# Patient Record
Sex: Male | Born: 1955 | Race: White | Hispanic: No | Marital: Single | Smoking: Never smoker
Health system: Southern US, Community
[De-identification: ages and names within clinical notes are randomized; demographics above are authoritative.]

## PROBLEM LIST (undated history)

## (undated) ENCOUNTER — Emergency Department (HOSPITAL_COMMUNITY): Admission: EM | Payer: Medicaid Other

## (undated) ENCOUNTER — Emergency Department (HOSPITAL_COMMUNITY): Payer: Medicare HMO

## (undated) DIAGNOSIS — F1010 Alcohol abuse, uncomplicated: Secondary | ICD-10-CM

## (undated) DIAGNOSIS — K7460 Unspecified cirrhosis of liver: Secondary | ICD-10-CM

## (undated) DIAGNOSIS — I4891 Unspecified atrial fibrillation: Secondary | ICD-10-CM

## (undated) DIAGNOSIS — I10 Essential (primary) hypertension: Secondary | ICD-10-CM

## (undated) DIAGNOSIS — B1920 Unspecified viral hepatitis C without hepatic coma: Secondary | ICD-10-CM

## (undated) DIAGNOSIS — K859 Acute pancreatitis without necrosis or infection, unspecified: Secondary | ICD-10-CM

## (undated) DIAGNOSIS — F419 Anxiety disorder, unspecified: Secondary | ICD-10-CM

## (undated) DIAGNOSIS — E079 Disorder of thyroid, unspecified: Secondary | ICD-10-CM

## (undated) DIAGNOSIS — F329 Major depressive disorder, single episode, unspecified: Secondary | ICD-10-CM

## (undated) DIAGNOSIS — F32A Depression, unspecified: Secondary | ICD-10-CM

## (undated) DIAGNOSIS — B182 Chronic viral hepatitis C: Secondary | ICD-10-CM

## (undated) DIAGNOSIS — F1910 Other psychoactive substance abuse, uncomplicated: Secondary | ICD-10-CM

## (undated) DIAGNOSIS — T1491XA Suicide attempt, initial encounter: Secondary | ICD-10-CM

## (undated) HISTORY — PX: EYE SURGERY: SHX253

## (undated) HISTORY — PX: HERNIA REPAIR: SHX51

## (undated) HISTORY — PX: LAPAROTOMY: SHX154

## (undated) HISTORY — PX: EXPLORATORY LAPAROTOMY: SUR591

## (undated) HISTORY — DX: Suicide attempt, initial encounter: T14.91XA

## (undated) HISTORY — PX: ABDOMINAL SURGERY: SHX537

---

## 2004-04-28 ENCOUNTER — Emergency Department (HOSPITAL_COMMUNITY): Admission: EM | Admit: 2004-04-28 | Discharge: 2004-04-28 | Payer: Self-pay | Admitting: Emergency Medicine

## 2004-08-11 ENCOUNTER — Emergency Department (HOSPITAL_COMMUNITY): Admission: EM | Admit: 2004-08-11 | Discharge: 2004-08-11 | Payer: Self-pay | Admitting: Emergency Medicine

## 2004-10-11 ENCOUNTER — Emergency Department (HOSPITAL_COMMUNITY): Admission: EM | Admit: 2004-10-11 | Discharge: 2004-10-11 | Payer: Self-pay | Admitting: Emergency Medicine

## 2005-02-27 ENCOUNTER — Emergency Department (HOSPITAL_COMMUNITY): Admission: EM | Admit: 2005-02-27 | Discharge: 2005-02-27 | Payer: Self-pay | Admitting: *Deleted

## 2005-11-21 ENCOUNTER — Emergency Department (HOSPITAL_COMMUNITY): Admission: EM | Admit: 2005-11-21 | Discharge: 2005-11-21 | Payer: Self-pay | Admitting: Emergency Medicine

## 2006-02-09 ENCOUNTER — Emergency Department (HOSPITAL_COMMUNITY): Admission: EM | Admit: 2006-02-09 | Discharge: 2006-02-09 | Payer: Self-pay | Admitting: Emergency Medicine

## 2006-02-11 ENCOUNTER — Emergency Department (HOSPITAL_COMMUNITY): Admission: EM | Admit: 2006-02-11 | Discharge: 2006-02-11 | Payer: Self-pay | Admitting: *Deleted

## 2006-03-25 ENCOUNTER — Inpatient Hospital Stay (HOSPITAL_COMMUNITY): Admission: AC | Admit: 2006-03-25 | Discharge: 2006-03-30 | Payer: Self-pay
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
## 2006-12-04 ENCOUNTER — Emergency Department (HOSPITAL_COMMUNITY): Admission: EM | Admit: 2006-12-04 | Discharge: 2006-12-04 | Payer: Self-pay | Admitting: Podiatry

## 2007-04-10 ENCOUNTER — Inpatient Hospital Stay (HOSPITAL_COMMUNITY): Admission: EM | Admit: 2007-04-10 | Discharge: 2007-04-13 | Payer: Self-pay | Admitting: Emergency Medicine

## 2007-04-18 ENCOUNTER — Inpatient Hospital Stay (HOSPITAL_COMMUNITY): Admission: EM | Admit: 2007-04-18 | Discharge: 2007-04-20 | Payer: Self-pay | Admitting: Emergency Medicine

## 2007-04-20 ENCOUNTER — Emergency Department (HOSPITAL_COMMUNITY): Admission: EM | Admit: 2007-04-20 | Discharge: 2007-04-20 | Payer: Self-pay | Admitting: Emergency Medicine

## 2007-04-22 ENCOUNTER — Inpatient Hospital Stay (HOSPITAL_COMMUNITY): Admission: EM | Admit: 2007-04-22 | Discharge: 2007-04-25 | Payer: Self-pay | Admitting: Emergency Medicine

## 2007-04-27 ENCOUNTER — Ambulatory Visit: Payer: Self-pay | Admitting: Internal Medicine

## 2007-04-27 ENCOUNTER — Emergency Department (HOSPITAL_COMMUNITY): Admission: EM | Admit: 2007-04-27 | Discharge: 2007-04-27 | Payer: Self-pay | Admitting: Emergency Medicine

## 2007-05-02 ENCOUNTER — Emergency Department (HOSPITAL_COMMUNITY): Admission: EM | Admit: 2007-05-02 | Discharge: 2007-05-02 | Payer: Self-pay | Admitting: Emergency Medicine

## 2007-05-09 ENCOUNTER — Emergency Department (HOSPITAL_COMMUNITY): Admission: EM | Admit: 2007-05-09 | Discharge: 2007-05-09 | Payer: Self-pay | Admitting: *Deleted

## 2007-05-10 ENCOUNTER — Emergency Department (HOSPITAL_COMMUNITY): Admission: EM | Admit: 2007-05-10 | Discharge: 2007-05-11 | Payer: Self-pay | Admitting: Emergency Medicine

## 2007-05-13 ENCOUNTER — Inpatient Hospital Stay (HOSPITAL_COMMUNITY): Admission: EM | Admit: 2007-05-13 | Discharge: 2007-05-14 | Payer: Self-pay | Admitting: Emergency Medicine

## 2007-05-15 ENCOUNTER — Emergency Department (HOSPITAL_COMMUNITY): Admission: EM | Admit: 2007-05-15 | Discharge: 2007-05-16 | Payer: Self-pay | Admitting: Emergency Medicine

## 2007-05-19 ENCOUNTER — Emergency Department (HOSPITAL_COMMUNITY): Admission: EM | Admit: 2007-05-19 | Discharge: 2007-05-20 | Payer: Self-pay | Admitting: Emergency Medicine

## 2007-05-25 ENCOUNTER — Emergency Department (HOSPITAL_COMMUNITY): Admission: EM | Admit: 2007-05-25 | Discharge: 2007-05-25 | Payer: Self-pay | Admitting: Emergency Medicine

## 2007-06-03 ENCOUNTER — Emergency Department (HOSPITAL_COMMUNITY): Admission: EM | Admit: 2007-06-03 | Discharge: 2007-06-04 | Payer: Self-pay | Admitting: Emergency Medicine

## 2007-06-06 ENCOUNTER — Emergency Department (HOSPITAL_COMMUNITY): Admission: EM | Admit: 2007-06-06 | Discharge: 2007-06-07 | Payer: Self-pay | Admitting: Emergency Medicine

## 2007-06-19 ENCOUNTER — Emergency Department (HOSPITAL_COMMUNITY): Admission: EM | Admit: 2007-06-19 | Discharge: 2007-06-19 | Payer: Self-pay | Admitting: Family Medicine

## 2007-06-23 ENCOUNTER — Emergency Department (HOSPITAL_COMMUNITY): Admission: EM | Admit: 2007-06-23 | Discharge: 2007-06-24 | Payer: Self-pay | Admitting: Emergency Medicine

## 2007-06-25 ENCOUNTER — Emergency Department (HOSPITAL_COMMUNITY): Admission: EM | Admit: 2007-06-25 | Discharge: 2007-06-26 | Payer: Self-pay | Admitting: Emergency Medicine

## 2007-07-11 ENCOUNTER — Inpatient Hospital Stay (HOSPITAL_COMMUNITY): Admission: EM | Admit: 2007-07-11 | Discharge: 2007-07-12 | Payer: Self-pay | Admitting: Emergency Medicine

## 2007-07-21 ENCOUNTER — Emergency Department (HOSPITAL_COMMUNITY): Admission: AC | Admit: 2007-07-21 | Discharge: 2007-07-21 | Payer: Self-pay

## 2007-07-25 ENCOUNTER — Emergency Department (HOSPITAL_COMMUNITY): Admission: EM | Admit: 2007-07-25 | Discharge: 2007-07-26 | Payer: Self-pay | Admitting: Emergency Medicine

## 2007-07-26 ENCOUNTER — Inpatient Hospital Stay (HOSPITAL_COMMUNITY): Admission: EM | Admit: 2007-07-26 | Discharge: 2007-07-31 | Payer: Self-pay | Admitting: Emergency Medicine

## 2007-07-31 ENCOUNTER — Emergency Department (HOSPITAL_COMMUNITY): Admission: EM | Admit: 2007-07-31 | Discharge: 2007-07-31 | Payer: Self-pay | Admitting: Emergency Medicine

## 2007-08-01 ENCOUNTER — Emergency Department (HOSPITAL_COMMUNITY): Admission: EM | Admit: 2007-08-01 | Discharge: 2007-08-01 | Payer: Self-pay | Admitting: Emergency Medicine

## 2007-08-02 ENCOUNTER — Emergency Department (HOSPITAL_COMMUNITY): Admission: EM | Admit: 2007-08-02 | Discharge: 2007-08-03 | Payer: Self-pay | Admitting: Emergency Medicine

## 2007-08-07 ENCOUNTER — Emergency Department (HOSPITAL_COMMUNITY): Admission: EM | Admit: 2007-08-07 | Discharge: 2007-08-08 | Payer: Self-pay | Admitting: Emergency Medicine

## 2007-08-10 ENCOUNTER — Emergency Department (HOSPITAL_COMMUNITY): Admission: EM | Admit: 2007-08-10 | Discharge: 2007-08-10 | Payer: Self-pay | Admitting: Emergency Medicine

## 2007-08-13 ENCOUNTER — Emergency Department (HOSPITAL_COMMUNITY): Admission: EM | Admit: 2007-08-13 | Discharge: 2007-08-14 | Payer: Self-pay | Admitting: Emergency Medicine

## 2007-08-21 ENCOUNTER — Emergency Department (HOSPITAL_COMMUNITY): Admission: EM | Admit: 2007-08-21 | Discharge: 2007-08-22 | Payer: Self-pay | Admitting: Emergency Medicine

## 2007-08-24 ENCOUNTER — Emergency Department (HOSPITAL_COMMUNITY): Admission: EM | Admit: 2007-08-24 | Discharge: 2007-08-24 | Payer: Self-pay | Admitting: Emergency Medicine

## 2007-09-01 ENCOUNTER — Emergency Department (HOSPITAL_COMMUNITY): Admission: EM | Admit: 2007-09-01 | Discharge: 2007-09-01 | Payer: Self-pay | Admitting: Emergency Medicine

## 2007-09-18 ENCOUNTER — Emergency Department (HOSPITAL_COMMUNITY): Admission: EM | Admit: 2007-09-18 | Discharge: 2007-09-18 | Payer: Self-pay | Admitting: Emergency Medicine

## 2007-12-06 ENCOUNTER — Emergency Department (HOSPITAL_COMMUNITY): Admission: EM | Admit: 2007-12-06 | Discharge: 2007-12-06 | Payer: Self-pay | Admitting: Emergency Medicine

## 2007-12-14 ENCOUNTER — Emergency Department (HOSPITAL_COMMUNITY): Admission: EM | Admit: 2007-12-14 | Discharge: 2007-12-14 | Payer: Self-pay | Admitting: Emergency Medicine

## 2008-01-08 ENCOUNTER — Emergency Department (HOSPITAL_COMMUNITY): Admission: EM | Admit: 2008-01-08 | Discharge: 2008-01-09 | Payer: Self-pay | Admitting: Emergency Medicine

## 2008-01-21 ENCOUNTER — Ambulatory Visit: Payer: Self-pay | Admitting: *Deleted

## 2008-01-21 ENCOUNTER — Emergency Department (HOSPITAL_COMMUNITY): Admission: EM | Admit: 2008-01-21 | Discharge: 2008-01-21 | Payer: Self-pay | Admitting: Emergency Medicine

## 2008-01-22 ENCOUNTER — Inpatient Hospital Stay (HOSPITAL_COMMUNITY): Admission: EM | Admit: 2008-01-22 | Discharge: 2008-01-23 | Payer: Self-pay | Admitting: Emergency Medicine

## 2008-01-25 ENCOUNTER — Emergency Department (HOSPITAL_COMMUNITY): Admission: EM | Admit: 2008-01-25 | Discharge: 2008-01-25 | Payer: Self-pay | Admitting: Emergency Medicine

## 2008-02-04 ENCOUNTER — Emergency Department (HOSPITAL_COMMUNITY): Admission: EM | Admit: 2008-02-04 | Discharge: 2008-02-05 | Payer: Self-pay | Admitting: Emergency Medicine

## 2008-02-05 ENCOUNTER — Emergency Department (HOSPITAL_COMMUNITY): Admission: EM | Admit: 2008-02-05 | Discharge: 2008-02-05 | Payer: Self-pay | Admitting: Internal Medicine

## 2008-02-18 ENCOUNTER — Emergency Department (HOSPITAL_COMMUNITY): Admission: EM | Admit: 2008-02-18 | Discharge: 2008-02-19 | Payer: Self-pay | Admitting: Emergency Medicine

## 2008-02-26 ENCOUNTER — Emergency Department (HOSPITAL_COMMUNITY): Admission: EM | Admit: 2008-02-26 | Discharge: 2008-02-27 | Payer: Self-pay | Admitting: Emergency Medicine
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
## 2008-03-12 ENCOUNTER — Emergency Department (HOSPITAL_COMMUNITY): Admission: EM | Admit: 2008-03-12 | Discharge: 2008-03-13 | Payer: Self-pay | Admitting: Emergency Medicine

## 2008-03-20 ENCOUNTER — Inpatient Hospital Stay (HOSPITAL_COMMUNITY): Admission: EM | Admit: 2008-03-20 | Discharge: 2008-03-22 | Payer: Self-pay | Admitting: Emergency Medicine

## 2008-03-31 ENCOUNTER — Emergency Department (HOSPITAL_COMMUNITY): Admission: EM | Admit: 2008-03-31 | Discharge: 2008-03-31 | Payer: Self-pay | Admitting: Emergency Medicine

## 2008-04-04 ENCOUNTER — Emergency Department (HOSPITAL_COMMUNITY): Admission: EM | Admit: 2008-04-04 | Discharge: 2008-04-05 | Payer: Self-pay | Admitting: Emergency Medicine

## 2008-04-10 ENCOUNTER — Emergency Department (HOSPITAL_COMMUNITY): Admission: EM | Admit: 2008-04-10 | Discharge: 2008-04-10 | Payer: Self-pay | Admitting: Emergency Medicine

## 2008-04-16 ENCOUNTER — Emergency Department (HOSPITAL_COMMUNITY): Admission: EM | Admit: 2008-04-16 | Discharge: 2008-04-16 | Payer: Self-pay | Admitting: Physician Assistant
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
## 2008-04-20 ENCOUNTER — Emergency Department (HOSPITAL_COMMUNITY): Admission: EM | Admit: 2008-04-20 | Discharge: 2008-04-20 | Payer: Self-pay | Admitting: Emergency Medicine
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
## 2008-04-30 ENCOUNTER — Emergency Department (HOSPITAL_COMMUNITY): Admission: EM | Admit: 2008-04-30 | Discharge: 2008-04-30 | Payer: Self-pay | Admitting: Emergency Medicine

## 2008-06-06 ENCOUNTER — Emergency Department (HOSPITAL_COMMUNITY): Admission: EM | Admit: 2008-06-06 | Discharge: 2008-06-06 | Payer: Self-pay | Admitting: Emergency Medicine

## 2008-06-21 ENCOUNTER — Emergency Department (HOSPITAL_COMMUNITY): Admission: EM | Admit: 2008-06-21 | Discharge: 2008-06-22 | Payer: Self-pay | Admitting: Emergency Medicine

## 2008-08-01 ENCOUNTER — Emergency Department (HOSPITAL_COMMUNITY): Admission: EM | Admit: 2008-08-01 | Discharge: 2008-08-01 | Payer: Self-pay | Admitting: Emergency Medicine

## 2008-08-06 ENCOUNTER — Ambulatory Visit: Payer: Self-pay | Admitting: Internal Medicine

## 2008-08-06 ENCOUNTER — Inpatient Hospital Stay (HOSPITAL_COMMUNITY): Admission: EM | Admit: 2008-08-06 | Discharge: 2008-08-09 | Payer: Self-pay | Admitting: Emergency Medicine

## 2008-08-07 ENCOUNTER — Encounter (INDEPENDENT_AMBULATORY_CARE_PROVIDER_SITE_OTHER): Payer: Self-pay | Admitting: Internal Medicine

## 2008-08-07 ENCOUNTER — Ambulatory Visit: Payer: Self-pay | Admitting: Cardiovascular Disease

## 2008-08-09 ENCOUNTER — Ambulatory Visit: Payer: Self-pay | Admitting: Psychiatry

## 2008-08-09 ENCOUNTER — Emergency Department (HOSPITAL_COMMUNITY): Admission: EM | Admit: 2008-08-09 | Discharge: 2008-08-09 | Payer: Self-pay | Admitting: Emergency Medicine

## 2008-08-09 ENCOUNTER — Inpatient Hospital Stay (HOSPITAL_COMMUNITY): Admission: AD | Admit: 2008-08-09 | Discharge: 2008-08-10 | Payer: Self-pay | Admitting: Psychiatry

## 2008-09-22 ENCOUNTER — Emergency Department (HOSPITAL_COMMUNITY): Admission: EM | Admit: 2008-09-22 | Discharge: 2008-09-23 | Payer: Self-pay | Admitting: Emergency Medicine

## 2008-10-31 ENCOUNTER — Emergency Department (HOSPITAL_COMMUNITY): Admission: EM | Admit: 2008-10-31 | Discharge: 2008-11-01 | Payer: Self-pay | Admitting: Emergency Medicine

## 2008-11-01 ENCOUNTER — Inpatient Hospital Stay (HOSPITAL_COMMUNITY): Admission: AD | Admit: 2008-11-01 | Discharge: 2008-11-03 | Payer: Self-pay | Admitting: Psychiatry

## 2008-11-01 ENCOUNTER — Ambulatory Visit: Payer: Self-pay | Admitting: Psychiatry

## 2008-11-22 ENCOUNTER — Inpatient Hospital Stay (HOSPITAL_COMMUNITY): Admission: EM | Admit: 2008-11-22 | Discharge: 2008-11-24 | Payer: Self-pay | Admitting: Emergency Medicine

## 2008-11-26 ENCOUNTER — Emergency Department (HOSPITAL_COMMUNITY): Admission: EM | Admit: 2008-11-26 | Discharge: 2008-11-26 | Payer: Self-pay | Admitting: Emergency Medicine

## 2008-11-29 ENCOUNTER — Emergency Department (HOSPITAL_COMMUNITY): Admission: EM | Admit: 2008-11-29 | Discharge: 2008-11-29 | Payer: Self-pay | Admitting: Emergency Medicine

## 2008-12-01 ENCOUNTER — Emergency Department (HOSPITAL_COMMUNITY): Admission: EM | Admit: 2008-12-01 | Discharge: 2008-12-02 | Payer: Self-pay | Admitting: Emergency Medicine

## 2008-12-07 ENCOUNTER — Emergency Department (HOSPITAL_COMMUNITY): Admission: EM | Admit: 2008-12-07 | Discharge: 2008-12-08 | Payer: Self-pay | Admitting: Emergency Medicine

## 2008-12-08 ENCOUNTER — Ambulatory Visit: Payer: Self-pay | Admitting: *Deleted

## 2008-12-08 ENCOUNTER — Inpatient Hospital Stay (HOSPITAL_COMMUNITY): Admission: AD | Admit: 2008-12-08 | Discharge: 2008-12-09 | Payer: Self-pay | Admitting: *Deleted

## 2008-12-22 ENCOUNTER — Ambulatory Visit: Payer: Self-pay | Admitting: Gastroenterology

## 2008-12-22 ENCOUNTER — Inpatient Hospital Stay (HOSPITAL_COMMUNITY): Admission: EM | Admit: 2008-12-22 | Discharge: 2008-12-25 | Payer: Self-pay | Admitting: Emergency Medicine

## 2008-12-23 ENCOUNTER — Encounter: Payer: Self-pay | Admitting: Gastroenterology

## 2008-12-26 IMAGING — CR DG CHEST 1V
1 series · 1 of 1 positions shown · non-contrast
Comparison: 04/28/2004

CLINICAL DATA: Chest pain

CHEST - 1 VIEW:

[view not recorded]
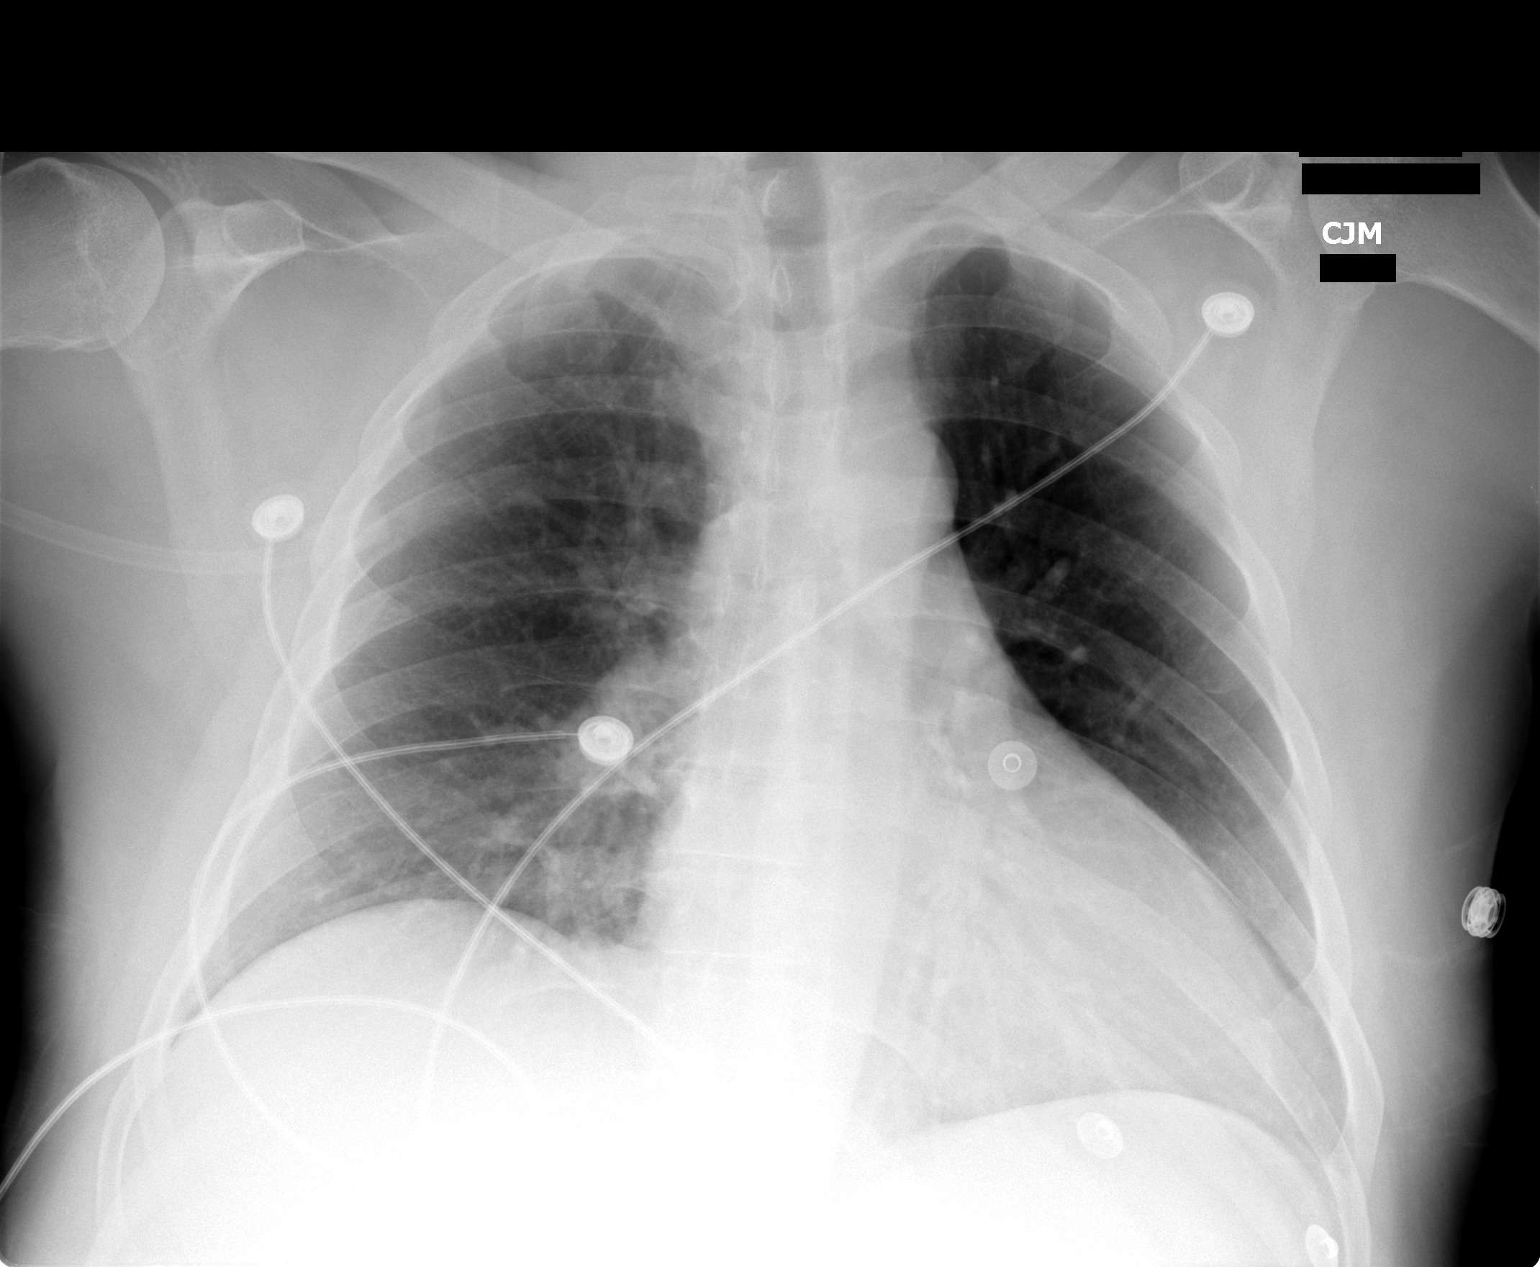

[1 of 1 positions shown; findings below may reference images not displayed]

FINDINGS: There is mild cardiomegaly. Slight increased density noted at the
medial right lung base. Can't exclude pneumonia. Left lung clear. No effusions.
IMPRESSION: Cardiomegaly. Increased density medial right lung base, question developing
pneumonia.

## 2008-12-28 ENCOUNTER — Emergency Department (HOSPITAL_COMMUNITY): Admission: EM | Admit: 2008-12-28 | Discharge: 2008-12-29 | Payer: Self-pay | Admitting: Emergency Medicine

## 2008-12-31 ENCOUNTER — Emergency Department (HOSPITAL_COMMUNITY): Admission: EM | Admit: 2008-12-31 | Discharge: 2009-01-01 | Payer: Self-pay | Admitting: Emergency Medicine

## 2009-01-01 ENCOUNTER — Ambulatory Visit: Payer: Self-pay | Admitting: Psychiatry

## 2009-01-01 ENCOUNTER — Inpatient Hospital Stay (HOSPITAL_COMMUNITY): Admission: AD | Admit: 2009-01-01 | Discharge: 2009-01-03 | Payer: Self-pay | Admitting: Psychiatry

## 2009-01-04 ENCOUNTER — Inpatient Hospital Stay (HOSPITAL_COMMUNITY): Admission: AC | Admit: 2009-01-04 | Discharge: 2009-01-06 | Payer: Self-pay

## 2009-01-06 ENCOUNTER — Emergency Department (HOSPITAL_COMMUNITY): Admission: EM | Admit: 2009-01-06 | Discharge: 2009-01-07 | Payer: Self-pay | Admitting: Emergency Medicine

## 2009-01-07 ENCOUNTER — Ambulatory Visit: Payer: Self-pay | Admitting: Psychiatry

## 2009-01-07 ENCOUNTER — Inpatient Hospital Stay (HOSPITAL_COMMUNITY): Admission: RE | Admit: 2009-01-07 | Discharge: 2009-01-09 | Payer: Self-pay | Admitting: Psychiatry

## 2009-01-20 ENCOUNTER — Emergency Department (HOSPITAL_COMMUNITY): Admission: EM | Admit: 2009-01-20 | Discharge: 2009-01-20 | Payer: Self-pay | Admitting: *Deleted

## 2009-01-21 ENCOUNTER — Emergency Department (HOSPITAL_COMMUNITY): Admission: EM | Admit: 2009-01-21 | Discharge: 2009-01-22 | Payer: Self-pay | Admitting: Emergency Medicine

## 2009-01-23 ENCOUNTER — Emergency Department (HOSPITAL_COMMUNITY): Admission: EM | Admit: 2009-01-23 | Discharge: 2009-01-24 | Payer: Self-pay | Admitting: Emergency Medicine

## 2009-01-29 ENCOUNTER — Inpatient Hospital Stay (HOSPITAL_COMMUNITY): Admission: EM | Admit: 2009-01-29 | Discharge: 2009-01-30 | Payer: Self-pay | Admitting: Emergency Medicine

## 2009-01-31 ENCOUNTER — Emergency Department (HOSPITAL_COMMUNITY): Admission: EM | Admit: 2009-01-31 | Discharge: 2009-01-31 | Payer: Self-pay | Admitting: Emergency Medicine

## 2009-02-01 ENCOUNTER — Inpatient Hospital Stay (HOSPITAL_COMMUNITY): Admission: EM | Admit: 2009-02-01 | Discharge: 2009-02-10 | Payer: Self-pay | Admitting: Emergency Medicine

## 2009-02-05 ENCOUNTER — Ambulatory Visit: Payer: Self-pay | Admitting: Surgery

## 2009-02-05 ENCOUNTER — Encounter (INDEPENDENT_AMBULATORY_CARE_PROVIDER_SITE_OTHER): Payer: Self-pay | Admitting: Internal Medicine

## 2009-02-11 ENCOUNTER — Emergency Department (HOSPITAL_COMMUNITY): Admission: EM | Admit: 2009-02-11 | Discharge: 2009-02-11 | Payer: Self-pay | Admitting: Emergency Medicine

## 2009-02-12 ENCOUNTER — Other Ambulatory Visit: Payer: Self-pay | Admitting: Emergency Medicine

## 2009-02-12 ENCOUNTER — Inpatient Hospital Stay (HOSPITAL_COMMUNITY): Admission: EM | Admit: 2009-02-12 | Discharge: 2009-02-13 | Payer: Self-pay | Admitting: Emergency Medicine

## 2009-02-21 ENCOUNTER — Inpatient Hospital Stay (HOSPITAL_COMMUNITY): Admission: EM | Admit: 2009-02-21 | Discharge: 2009-02-24 | Payer: Self-pay | Admitting: Emergency Medicine

## 2009-02-22 ENCOUNTER — Encounter: Payer: Self-pay | Admitting: Gastroenterology

## 2009-02-23 ENCOUNTER — Ambulatory Visit: Payer: Self-pay | Admitting: Internal Medicine

## 2009-03-02 ENCOUNTER — Emergency Department (HOSPITAL_COMMUNITY): Admission: EM | Admit: 2009-03-02 | Discharge: 2009-03-03 | Payer: Self-pay | Admitting: Emergency Medicine

## 2009-03-26 ENCOUNTER — Emergency Department (HOSPITAL_COMMUNITY): Admission: EM | Admit: 2009-03-26 | Discharge: 2009-03-27 | Payer: Self-pay | Admitting: Emergency Medicine

## 2009-04-18 ENCOUNTER — Emergency Department (HOSPITAL_COMMUNITY): Admission: EM | Admit: 2009-04-18 | Discharge: 2009-04-18 | Payer: Self-pay | Admitting: Emergency Medicine

## 2009-04-25 ENCOUNTER — Emergency Department (HOSPITAL_COMMUNITY): Admission: EM | Admit: 2009-04-25 | Discharge: 2009-04-26 | Payer: Self-pay | Admitting: Emergency Medicine

## 2009-05-02 IMAGING — CR DG ABDOMEN ACUTE W/ 1V CHEST
3 series · 3 of 3 positions shown · non-contrast
Comparison: none

CLINICAL DATA: Chest pain, GI bleed

ABDOMEN SERIES - 2 VIEW AND CHEST - 1 VIEW:

[w chest pa]
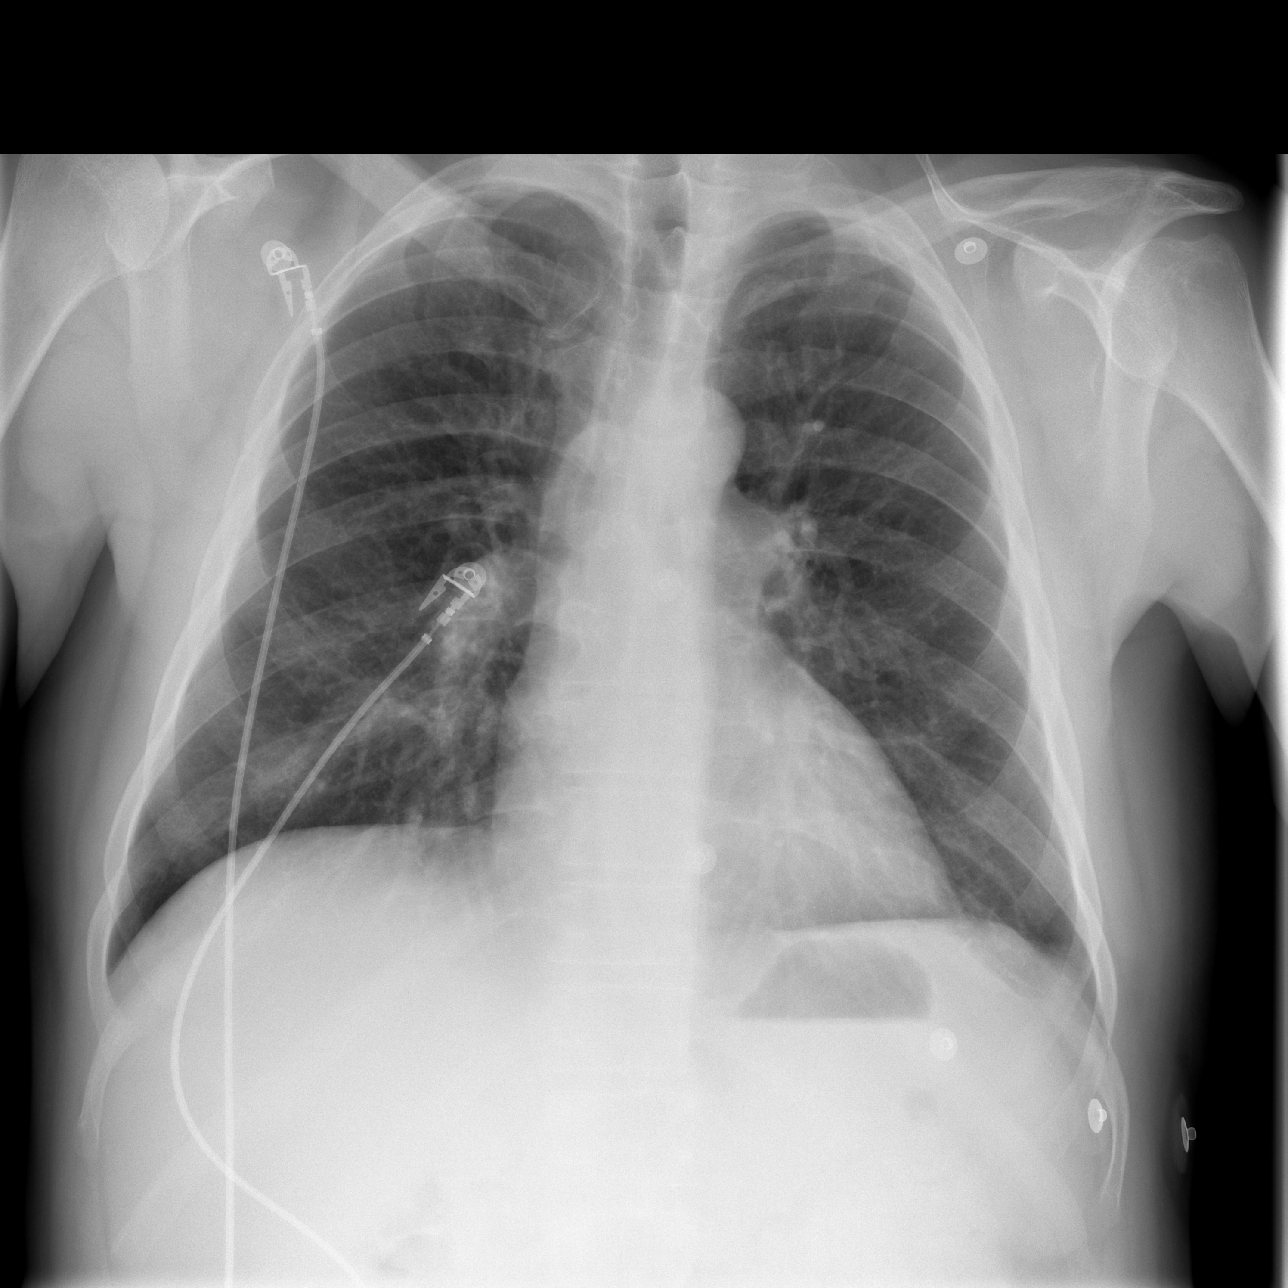

[w abdomen upright]
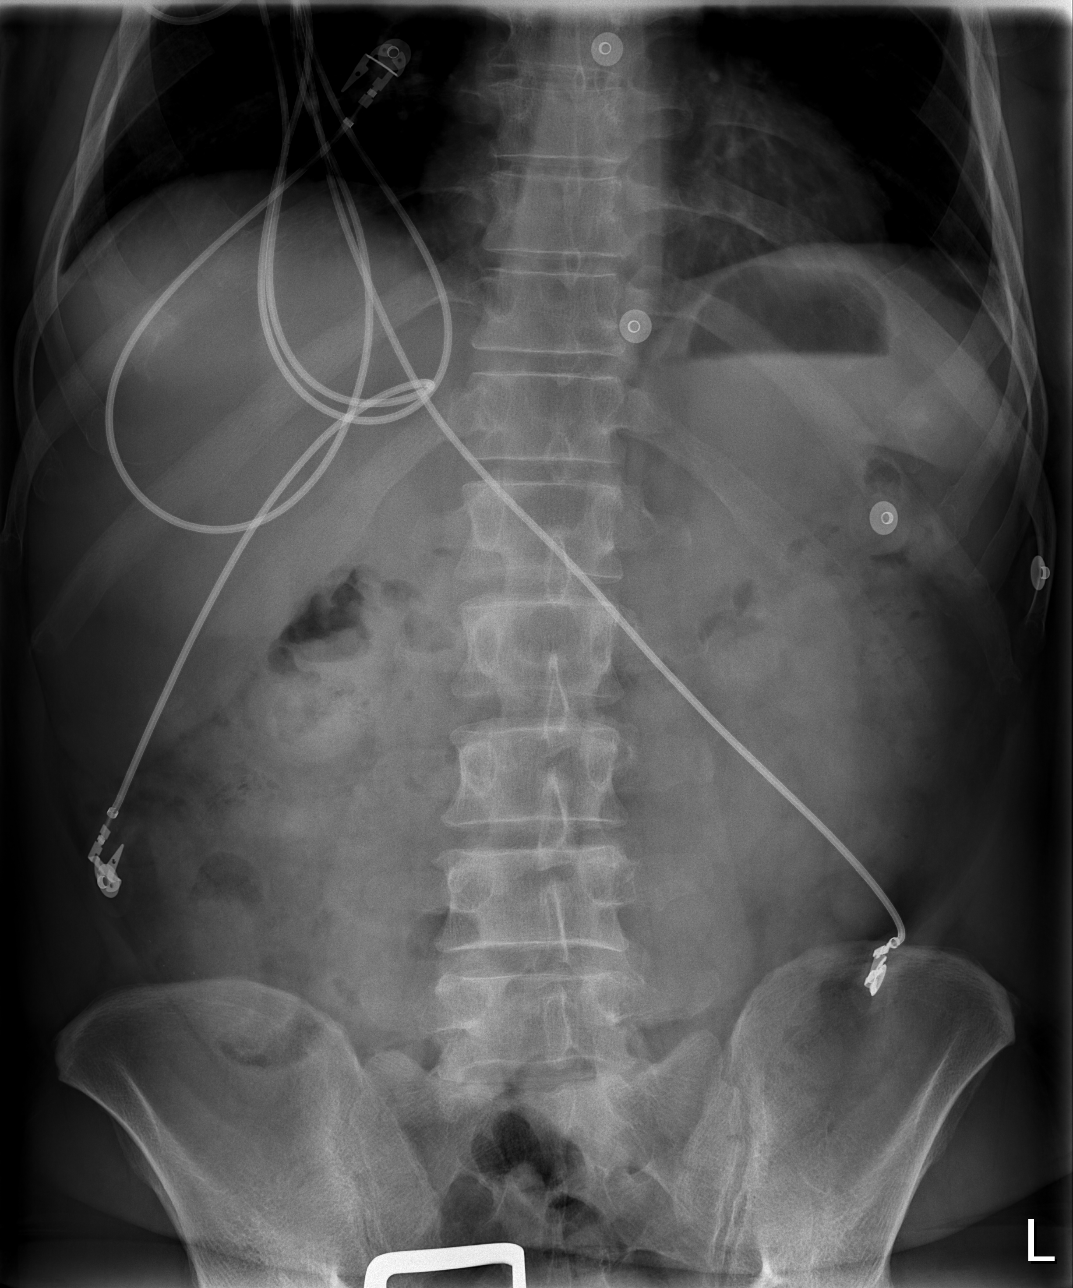

[t abdomen supine]
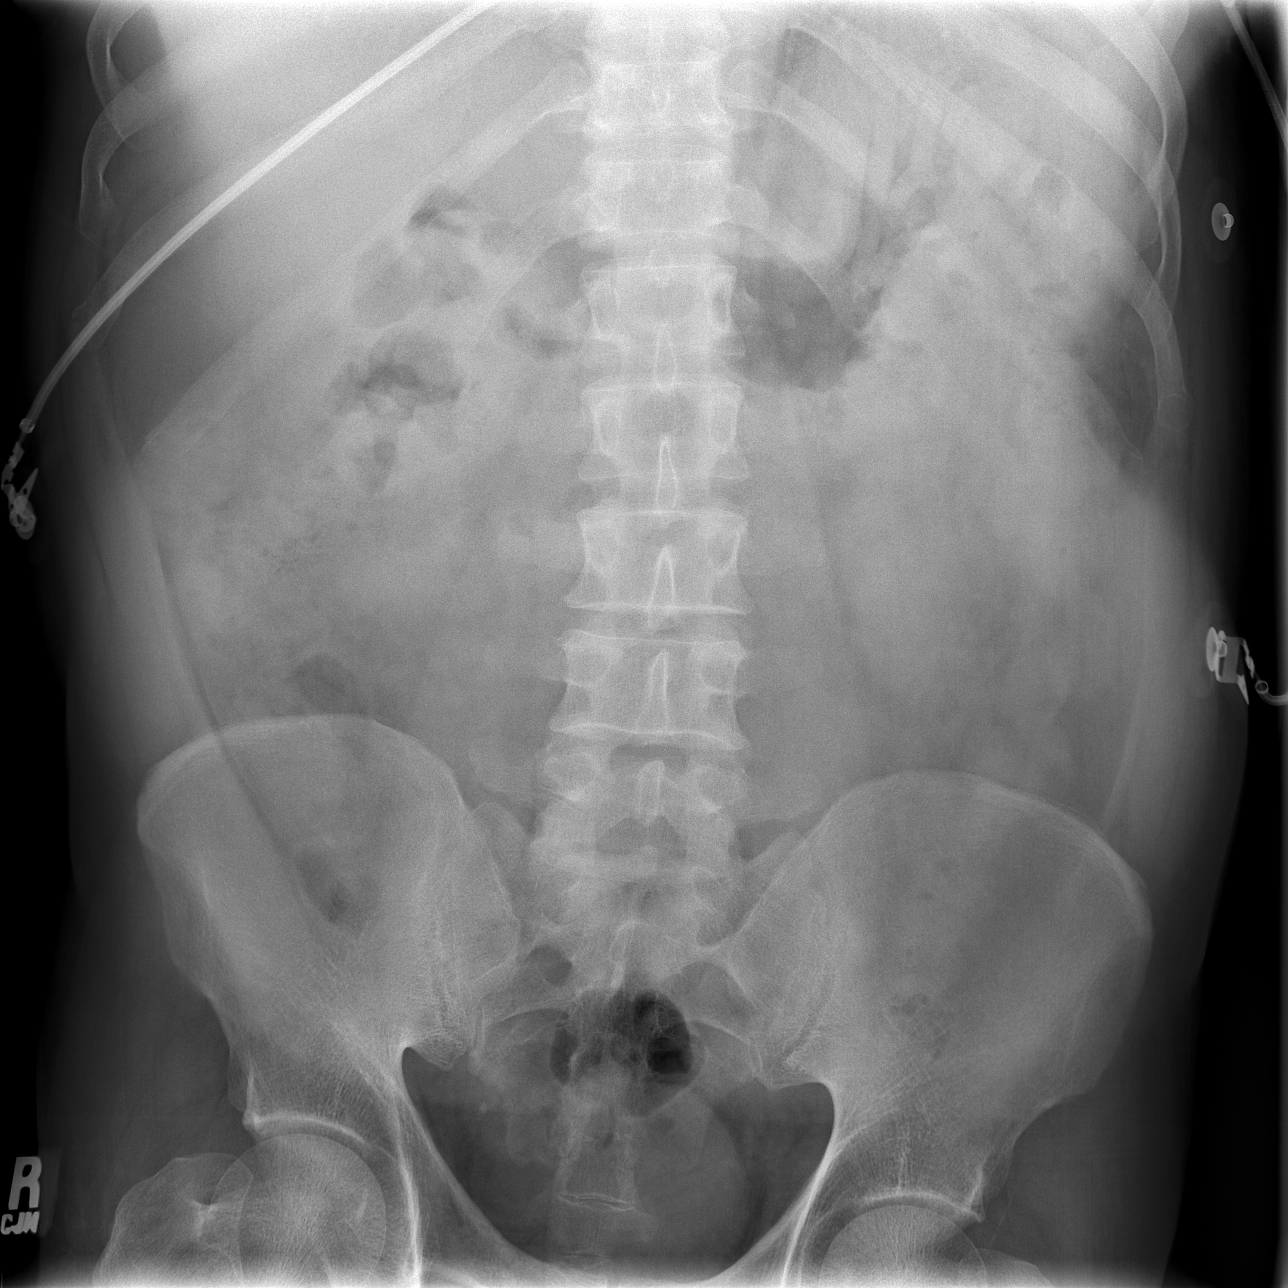

[3 of 3 positions shown; findings below may reference images not displayed]

FINDINGS: The bowel gas pattern is normal.  There is no evidence of free
intraperitoneal air.  No radio-opaque calculi or other significant radiographic
abnormality is seen.

Heart size and mediastinal contours are within normal limits.  Both lungs are
clear.
IMPRESSION: Negative abdominal radiographs.  No acute cardiopulmonary disease.

## 2009-05-03 IMAGING — US US ABDOMEN COMPLETE
1 series · 14 of 25 positions shown · non-contrast
Comparison: None.

CLINICAL DATA: Upper GI bleeding.  Rule out cirrhosis.
 ABDOMEN ULTRASOUND:
TECHNIQUE: Complete abdominal ultrasound examination was performed including evaluation of the liver, gallbladder, bile ducts, pancreas, kidneys, spleen, IVC, and abdominal aorta.

[Series 1: unknown · 0.33mm/px · 14 of 73 slices shown]
[im 1/73]
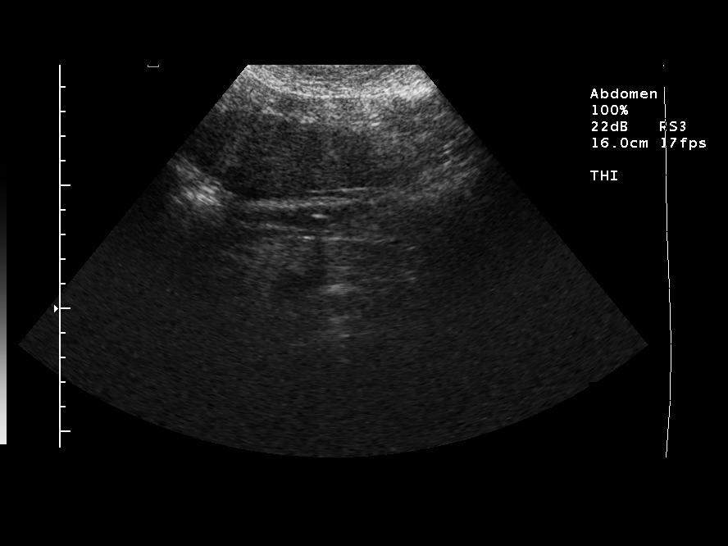
[im 7/73]
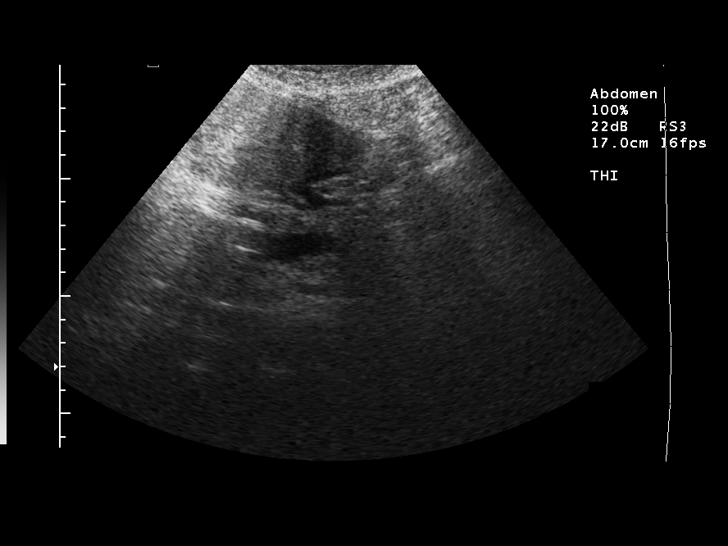
[im 13/73]
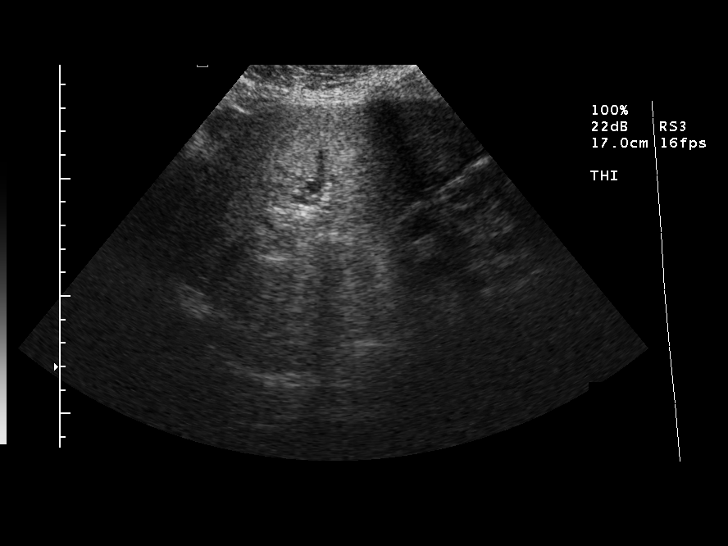
[im 19/73]
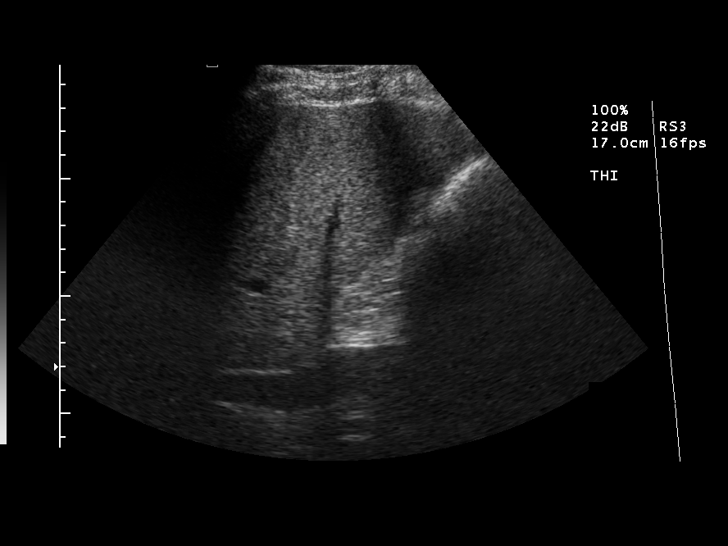
[im 25/73]
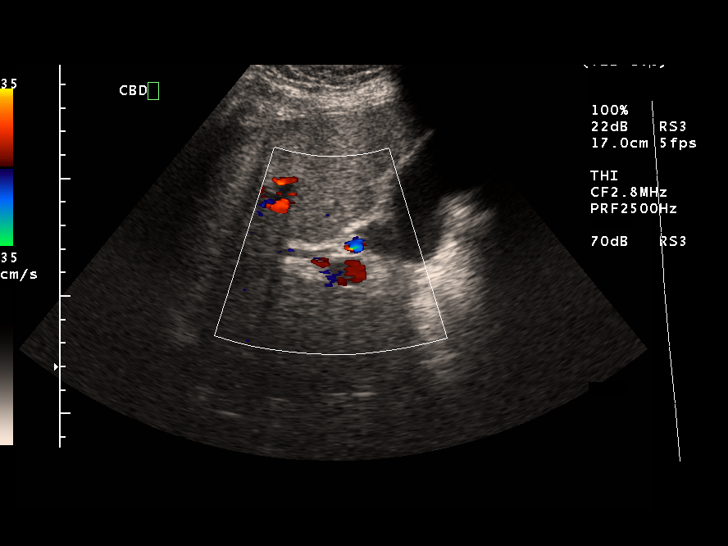
[im 28/73]
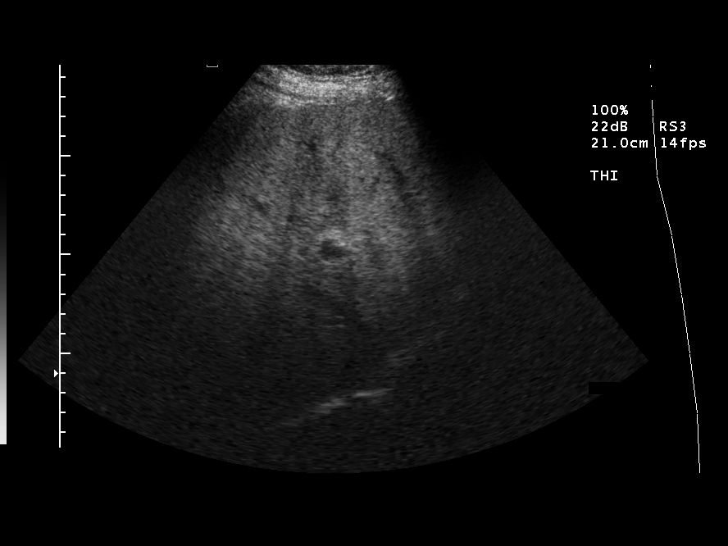
[im 34/73]
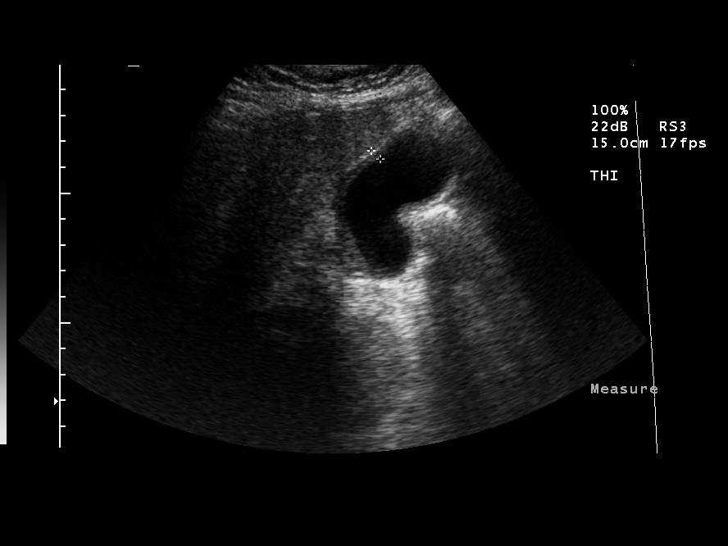
[im 40/73]
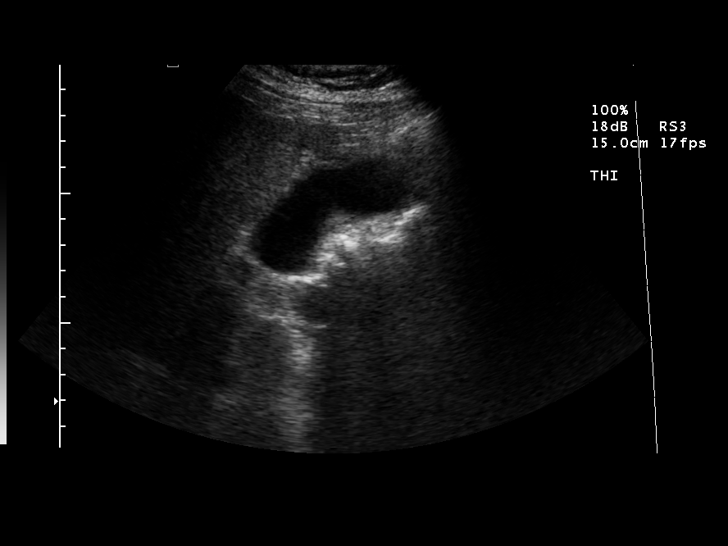
[im 46/73]
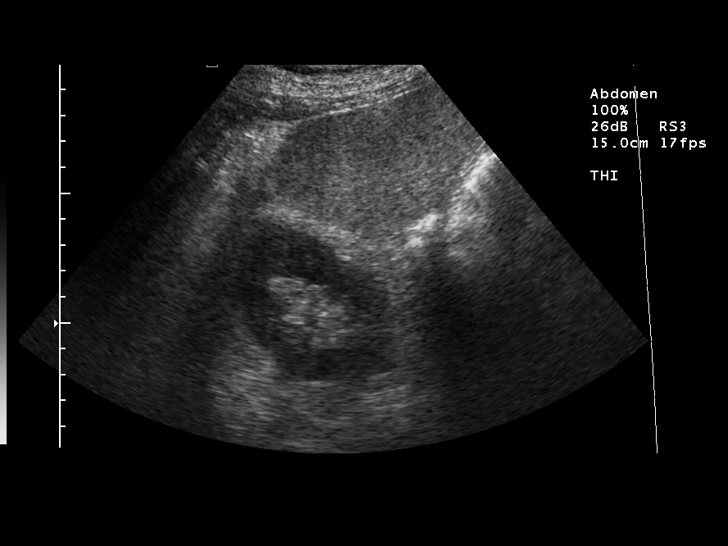
[im 49/73]
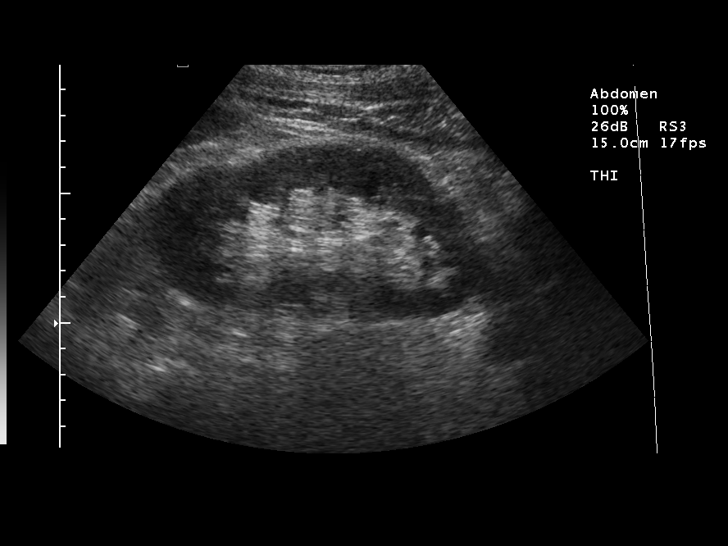
[im 55/73]
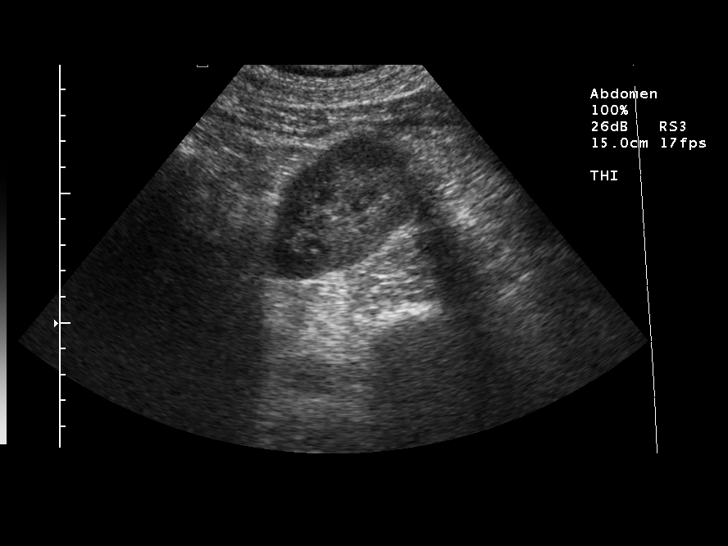
[im 61/73]
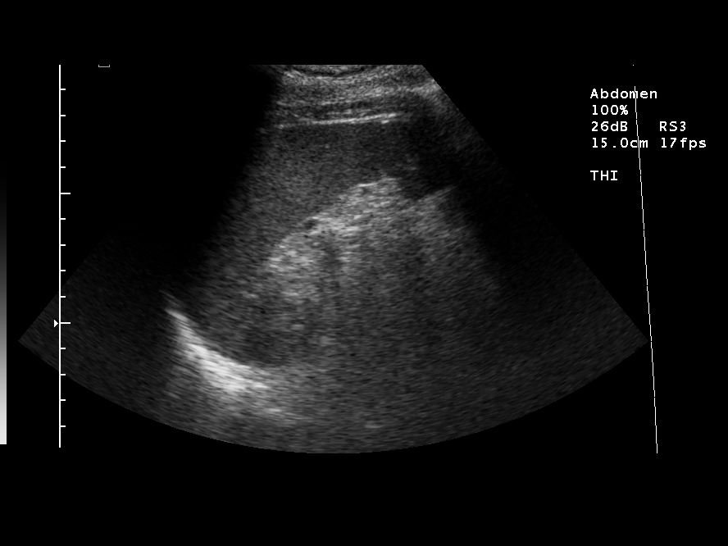
[im 67/73]
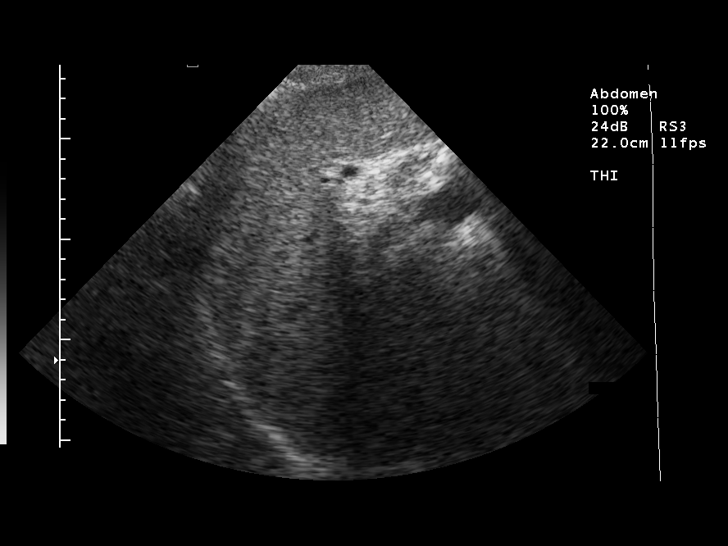
[im 73/73]
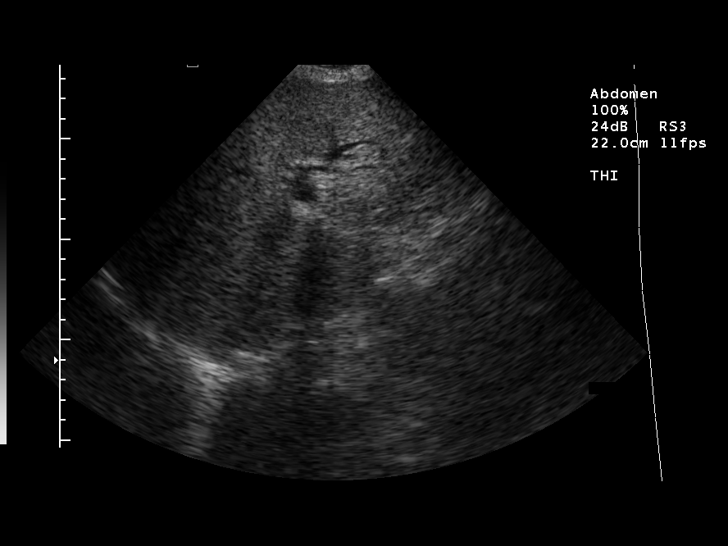

[14 of 25 positions shown; findings below may reference images not displayed]

FINDINGS: Negative for gallstones.  The gallbladder wall is mildly thickened at 5 mm and there appears to be some mild pericholecystic fluid.  This could be due to hepatocellular disease or possibly acalculous cholecystitis.  The patient is not tender over the gallbladder.  The common bile duct is 4 mm.  The liver is echodense and heterogeneous without focal mass lesion.  The left lobe appears hypertrophied, which could be due to cirrhosis.  IVC, pancreas, kidneys and aorta are normal.
IMPRESSION: 1. Gallbladder is distended and has mild gallbladder wall thickening and pericholecystic fluid but no stones.  
 2. There is abnormal appearance of the liver suggesting hepatocellular disease, possibly cirrhosis.
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
## 2009-05-05 ENCOUNTER — Inpatient Hospital Stay (HOSPITAL_COMMUNITY): Admission: EM | Admit: 2009-05-05 | Discharge: 2009-05-08 | Payer: Self-pay | Admitting: Emergency Medicine

## 2009-05-08 ENCOUNTER — Inpatient Hospital Stay (HOSPITAL_COMMUNITY): Admission: AC | Admit: 2009-05-08 | Discharge: 2009-05-12 | Payer: Self-pay
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
## 2009-05-12 ENCOUNTER — Emergency Department (HOSPITAL_COMMUNITY): Admission: EM | Admit: 2009-05-12 | Discharge: 2009-05-13 | Payer: Self-pay | Admitting: Emergency Medicine

## 2009-05-14 IMAGING — CR DG ABDOMEN ACUTE W/ 1V CHEST
3 series · 3 of 3 positions shown · non-contrast
Comparison: CT 04/12/2007

CLINICAL DATA: Abdominal pain

ABDOMEN SERIES - 2 VIEW & CHEST - 1 VIEW

[w chest pa]
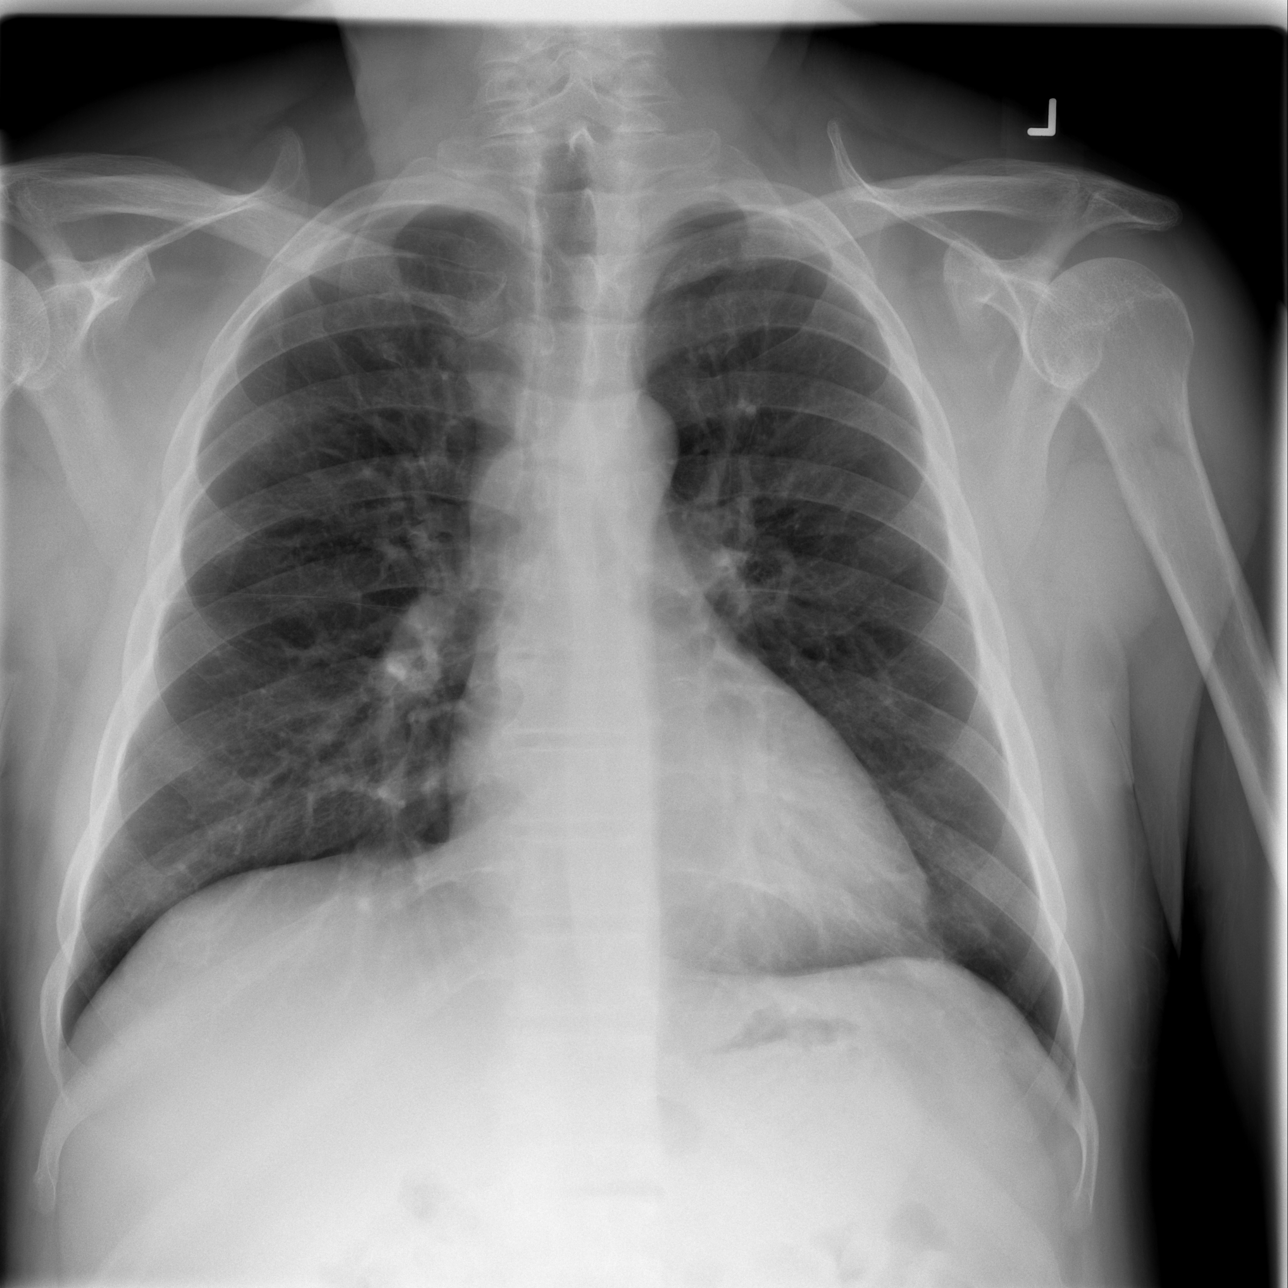

[w abdomen upright]
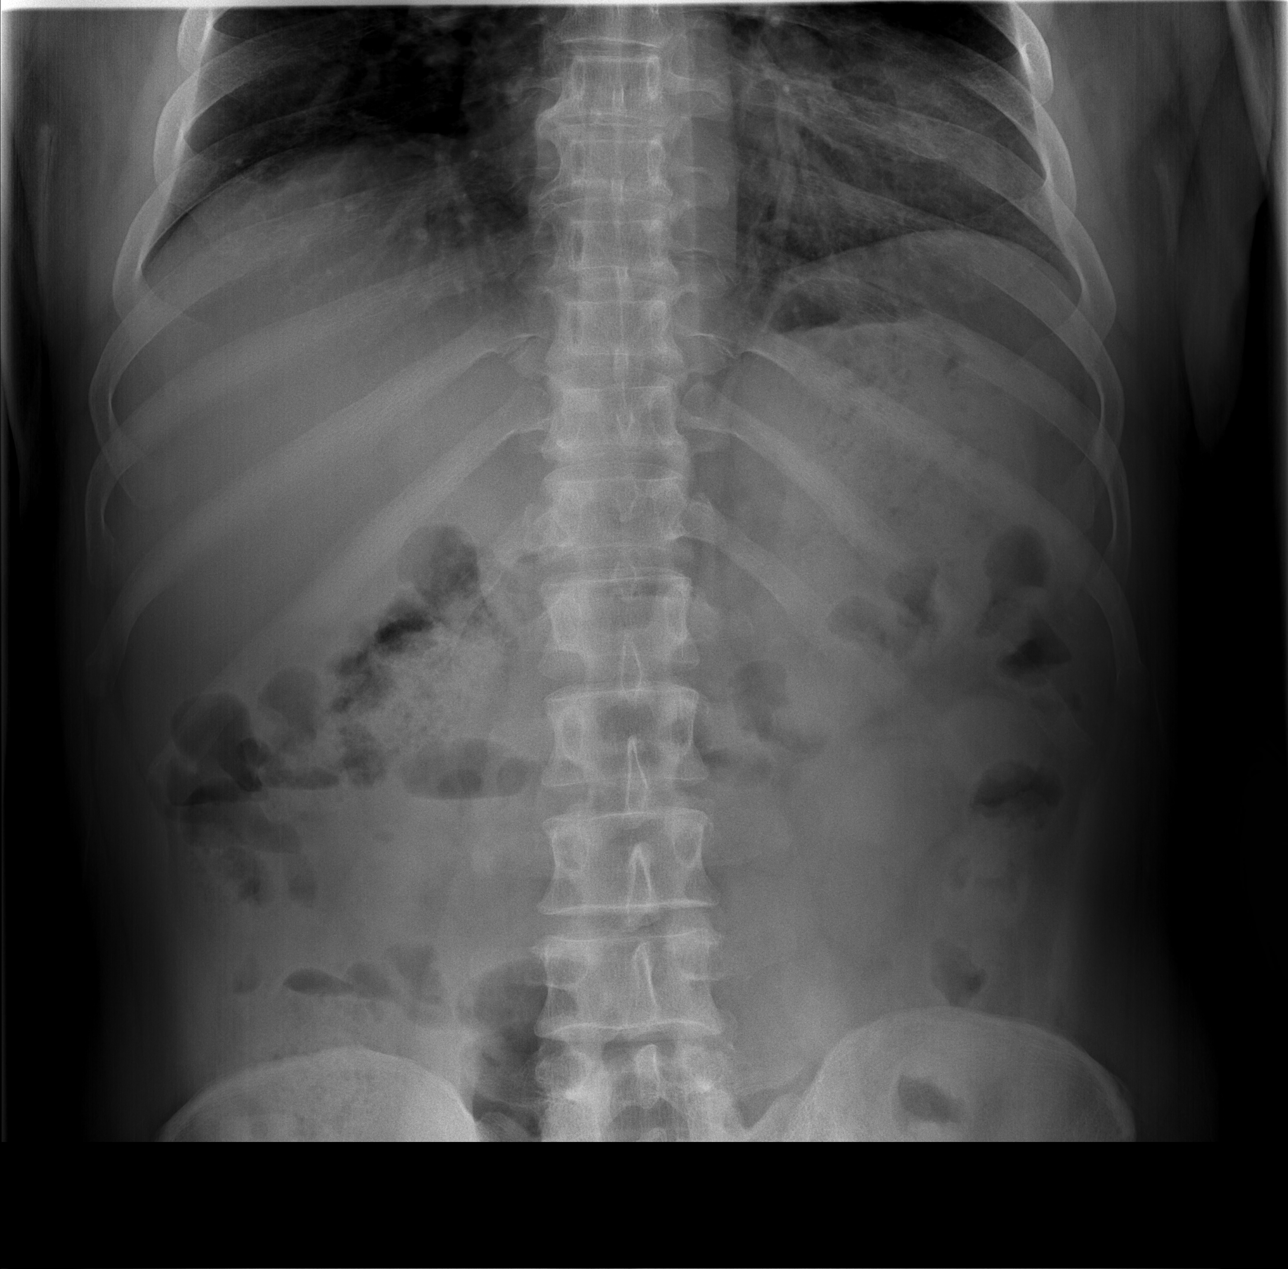

[t abdomen supine]
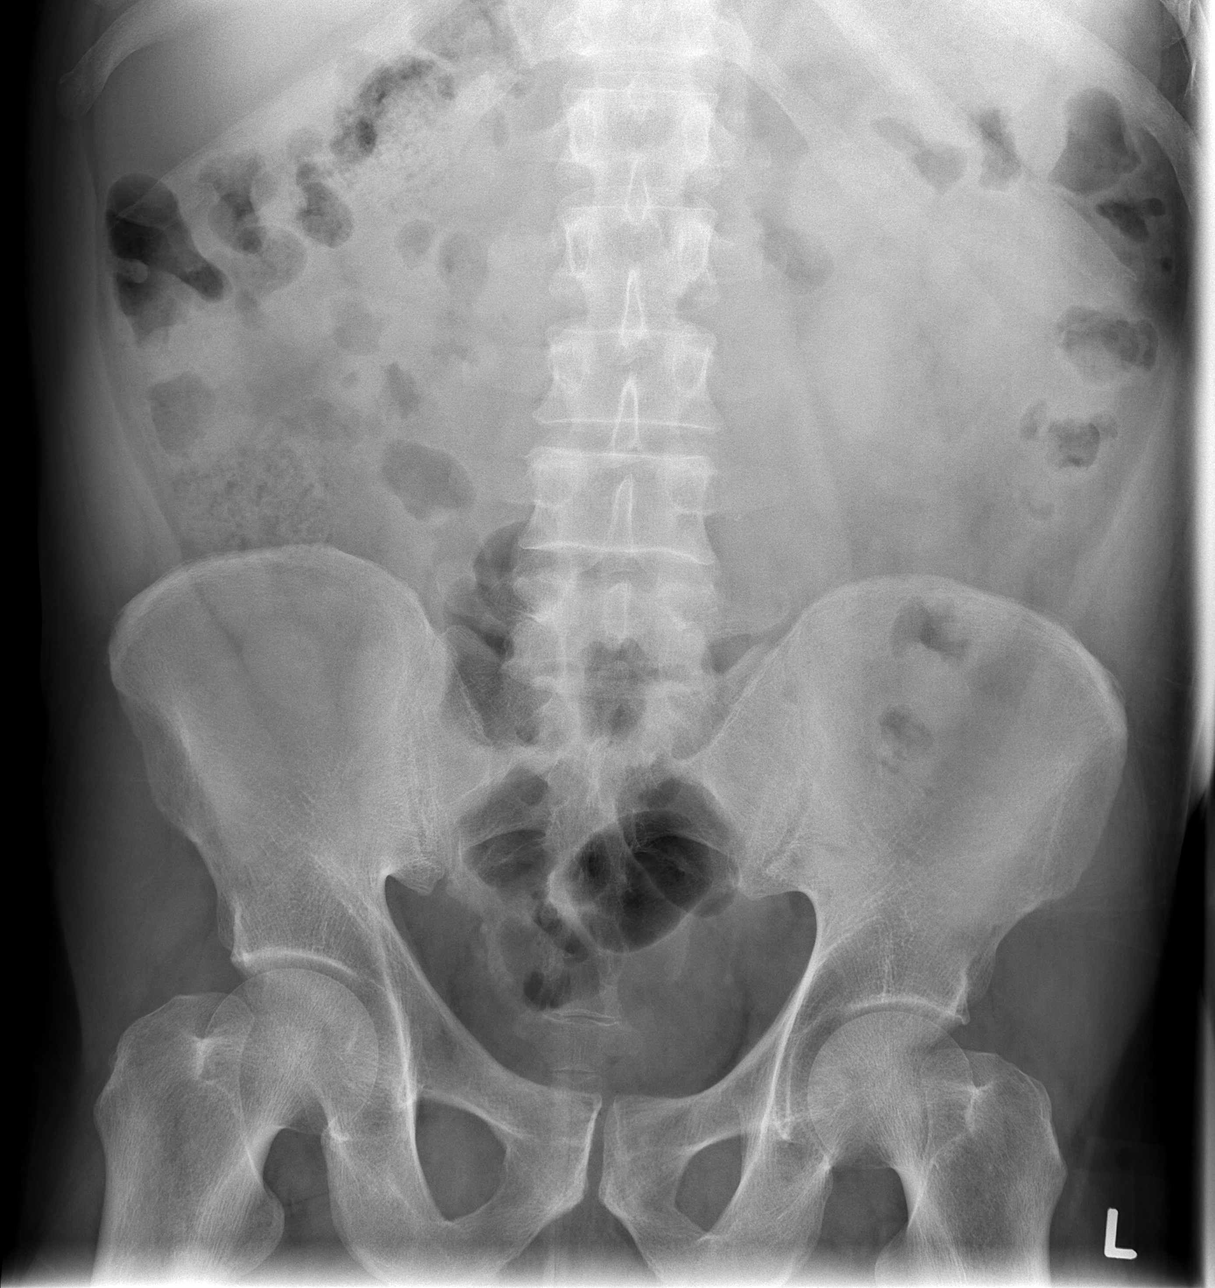

[3 of 3 positions shown; findings below may reference images not displayed]

FINDINGS: Heart and mediastinal contours are within normal limits. There is
mild peribronchial thickening. No focal opacities or effusions.

There is a nonobstructed bowel gas pattern. No free air. No organomegaly or
suspicious calcification.

IMPRESSION

No obstruction or free air.

Mild bronchitic changes.

## 2009-05-15 ENCOUNTER — Emergency Department (HOSPITAL_COMMUNITY): Admission: EM | Admit: 2009-05-15 | Discharge: 2009-05-15 | Payer: Self-pay | Admitting: Emergency Medicine

## 2009-05-15 IMAGING — CT CT PELVIS W/ CM
2 of 5 series · 16 of 46 positions shown, 18 images · IV contrast (APPLIED)
Comparison: 04/12/2007

ABDOMEN CT WITH CONTRAST

CLINICAL DATA: Left upper quadrant pain
TECHNIQUE: Multidetector CT imaging of the abdomen and pelvis was performed
following the standard protocol during bolus administration of intravenous
contrast.

Contrast:  100 cc Omnipaque 300

[Series 2: abd/pelv with 5.0 b31f st · axial · 0.72mm/px · z∈[+1030,+1424]mm · 13 of 91 slices shown, 15 images]
[im 6/91  soft-tissue]
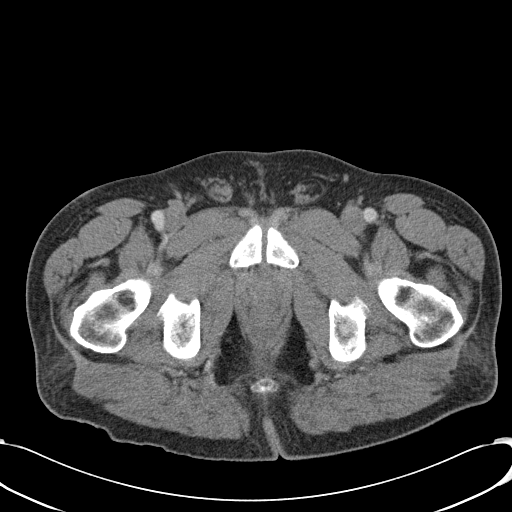
[im 6/91  bone]
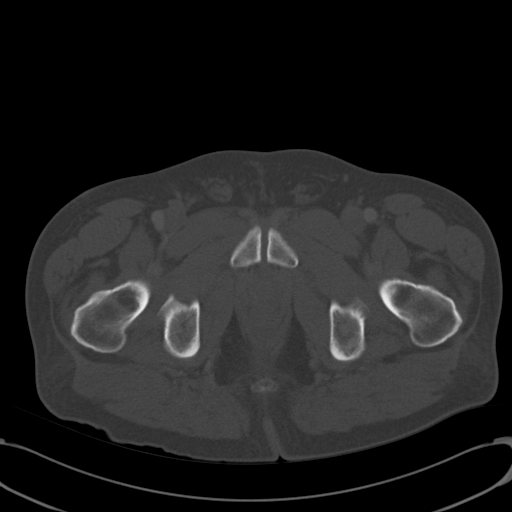
[im 11/91  soft-tissue]
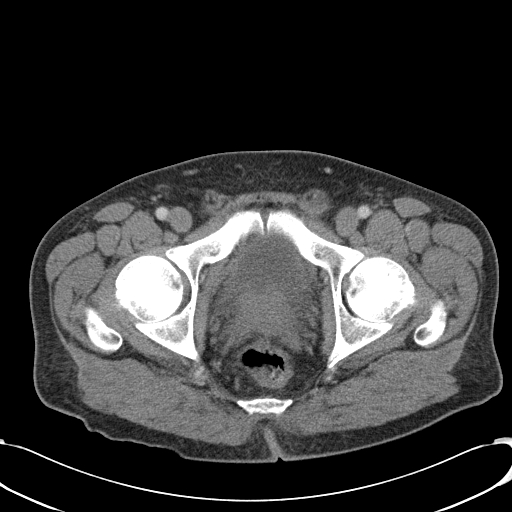
[im 22/91  soft-tissue]
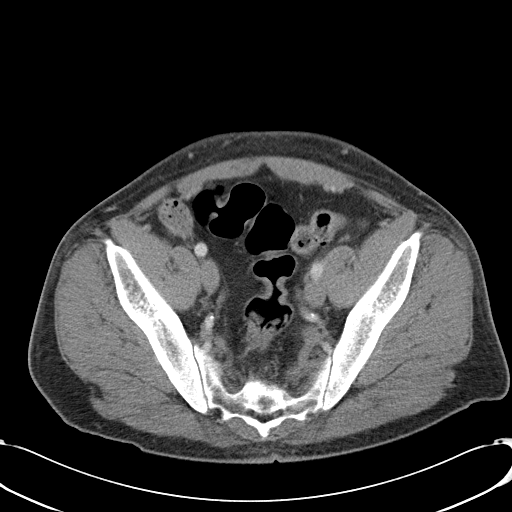
[im 27/91  soft-tissue]
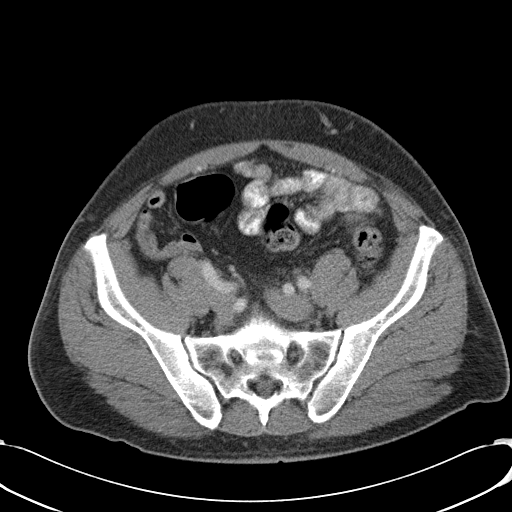
[im 32/91  soft-tissue]
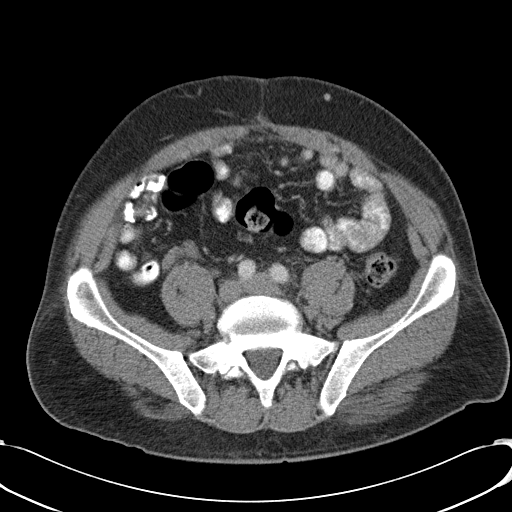
[im 38/91  soft-tissue]
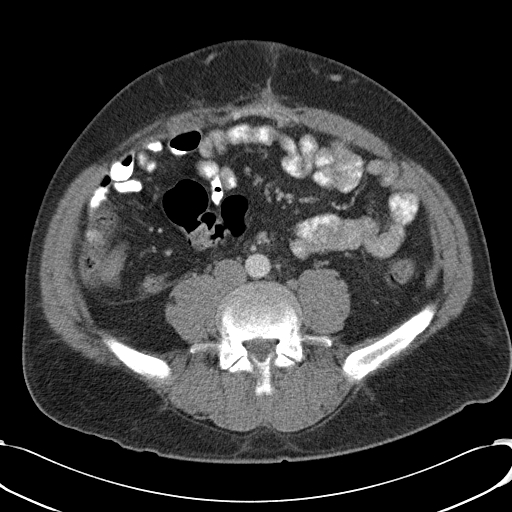
[im 48/91  soft-tissue]
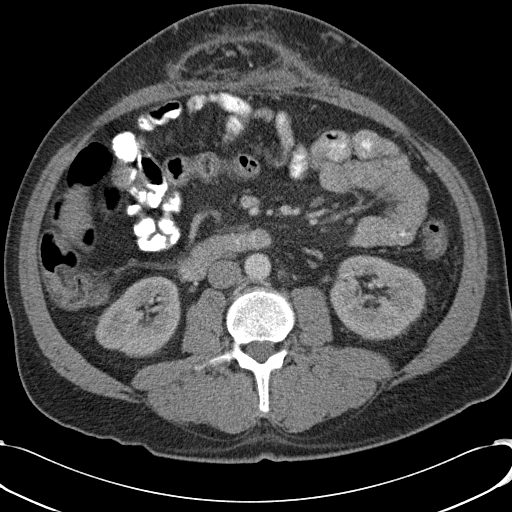
[im 53/91  soft-tissue]
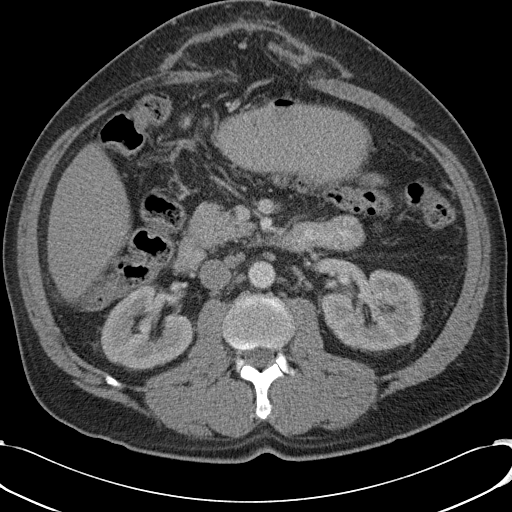
[im 59/91  soft-tissue]
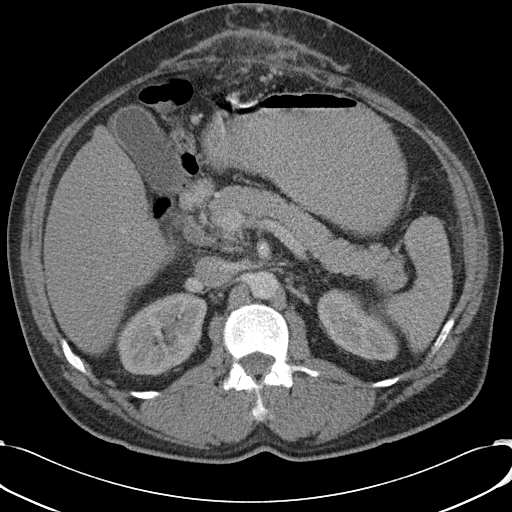
[im 59/91  bone]
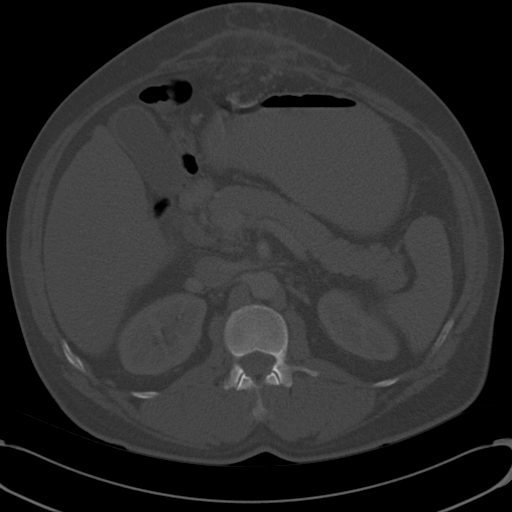
[im 64/91  soft-tissue]
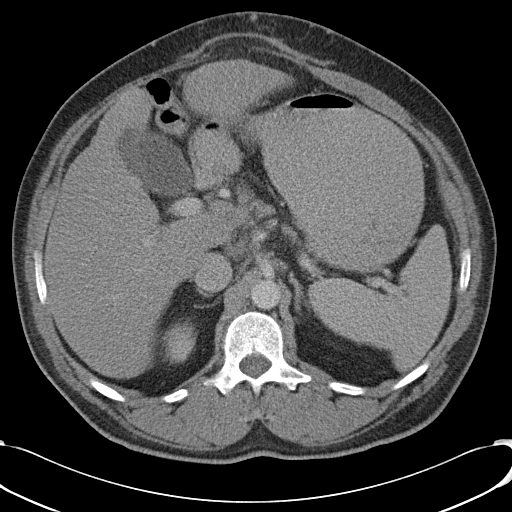
[im 69/91  soft-tissue]
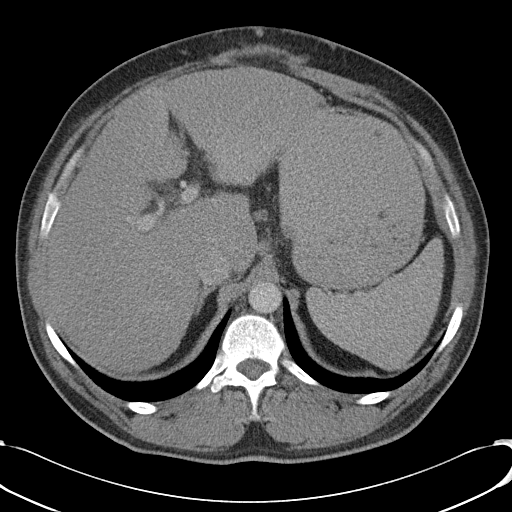
[im 80/91  soft-tissue]
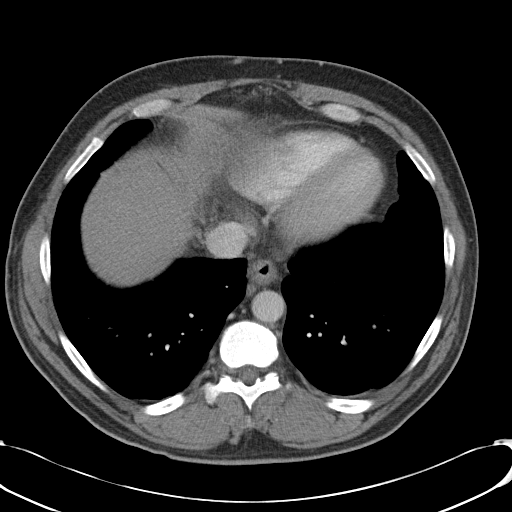
[im 85/91  soft-tissue]
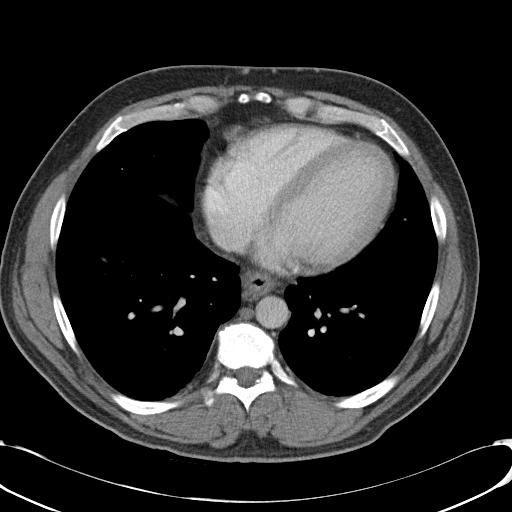

[Series 5: abd/pelv with 2.0 spo cor st · coronal · 0.88mm/px · 3 of 164 slices shown]
[im 55/164  soft-tissue]
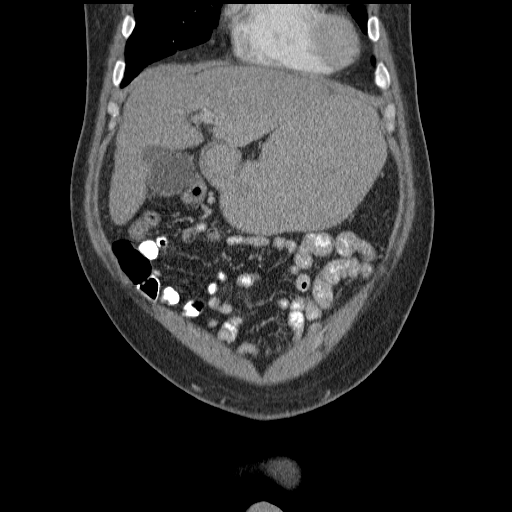
[im 73/164  soft-tissue]
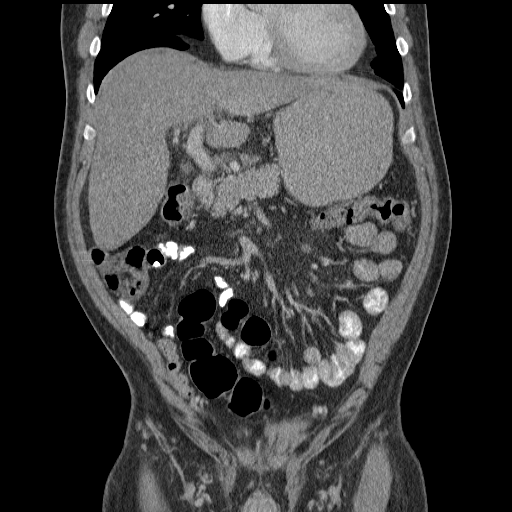
[im 91/164  soft-tissue]
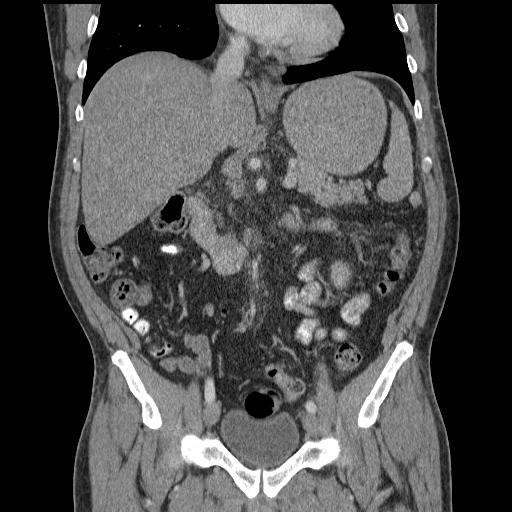

[16 of 46 positions shown; findings below may reference images not displayed]

FINDINGS: The previously described marked distal esophageal wall thickening is
not as pronounced on today's study. There continues to be mild distal esophageal
thickening. Prominent adjacent posterior mediastinal lymph nodes, measuring 10
mm in greatest diameter on image 11. There also mildly enlarged cardiophrenic
lymph nodes. Lymph node anterior to the right ventricle measures 15 mm in
longest diameter on image 7. There are also enlarged gastrohepatic, porta
hepatis, and celiac axis lymph nodes. Index lymph node on image 30 measures 21 x
14 mm, essentially unchanged since prior study. Ventral hernia is again noted,
containing fat only, unchanged. Improved the recanalization of the umbilical
vein compatible with portal venous hypertension. Fatty infiltration the liver
noted. There may be subtle early changes of cirrhosis with fine nodular contours
to the liver. No focal lesion in the liver, spleen, pancreas, adrenals, or
kidneys. There is a small amount of fluid around the gallbladder which I suspect
is related to liver disease rather than cholecystitis. Bowel grossly
unremarkable.

IMPRESSION

Previously described distal esophageal wall thickening is less prominent but
remains present, with adjacent paraesophageal, cardiophrenic, and upper
abdominal adenopathy, stable.

Probable changes of early cirrhosis. Recanalization of the umbilical vein is
noted compatible with portal venous hypertension.

No change since prior study.

PELVIS CT WITH CONTRAST
FINDINGS: No free fluid, free air, or adenopathy. Bowel grossly unremarkable.
No focal bony abnormality.

IMPRESSION

No acute findings in the pelvis.

## 2009-05-18 ENCOUNTER — Inpatient Hospital Stay (HOSPITAL_COMMUNITY): Admission: EM | Admit: 2009-05-18 | Discharge: 2009-05-27 | Payer: Self-pay | Admitting: Emergency Medicine

## 2009-05-18 ENCOUNTER — Encounter: Payer: Self-pay | Admitting: Emergency Medicine
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
## 2009-05-20 ENCOUNTER — Ambulatory Visit: Payer: Self-pay | Admitting: Gastroenterology

## 2009-05-31 IMAGING — CR DG ABDOMEN ACUTE W/ 1V CHEST
3 series · 3 of 3 positions shown · non-contrast
Comparison: none

HISTORY: Left-sided abdominal pain, blood in stool, hematuria

[w chest pa]
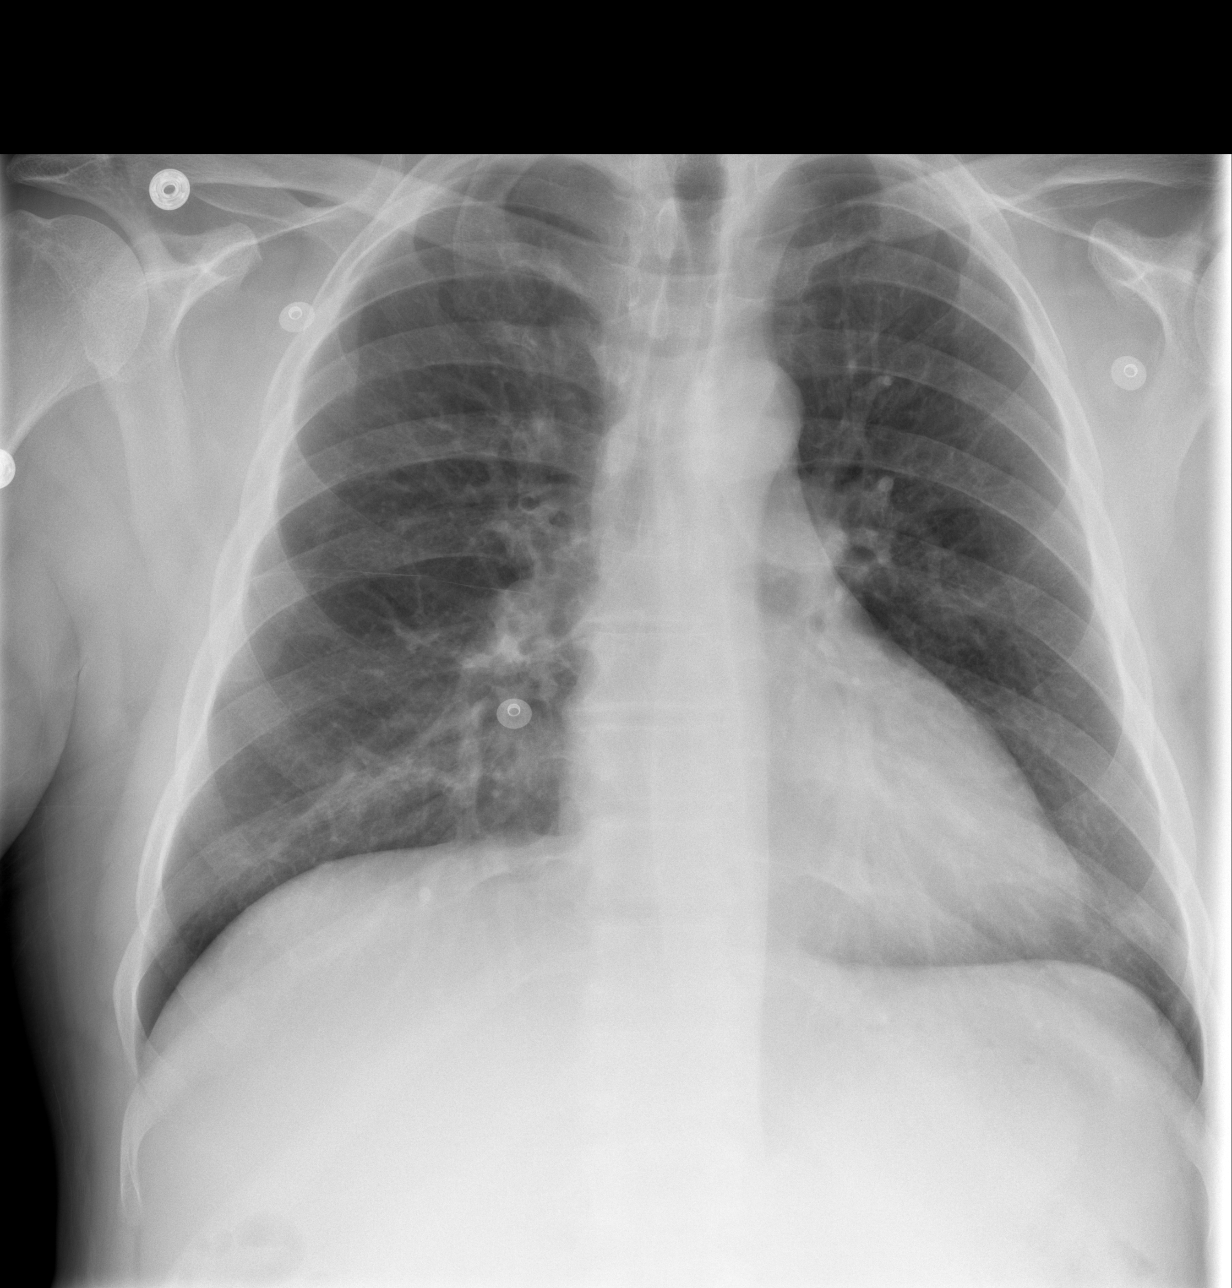

[w abdomen upright]
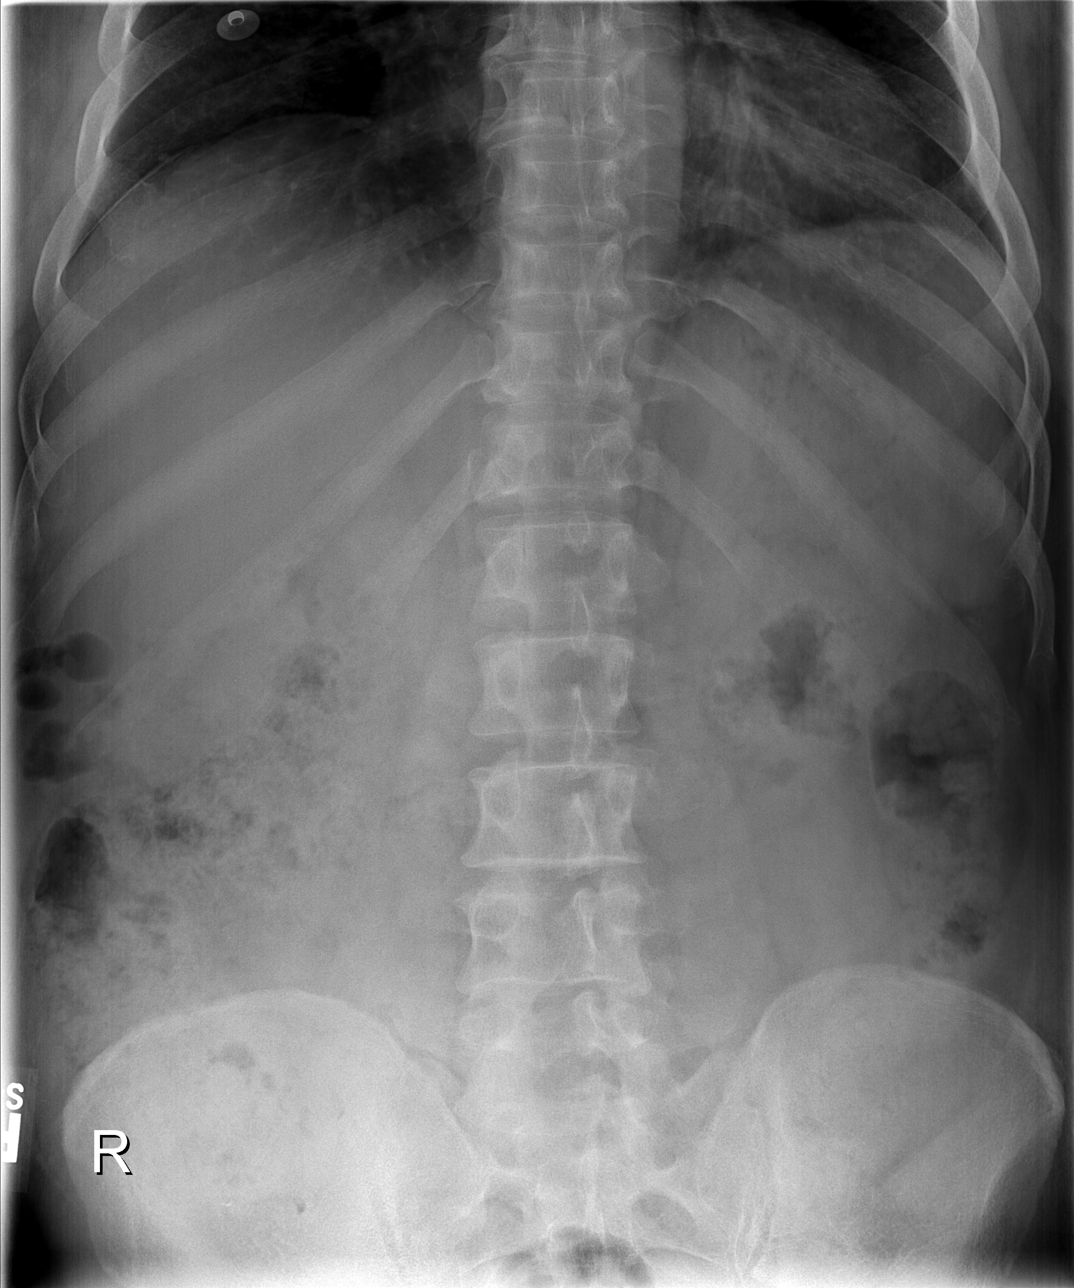

[t abdomen supine]
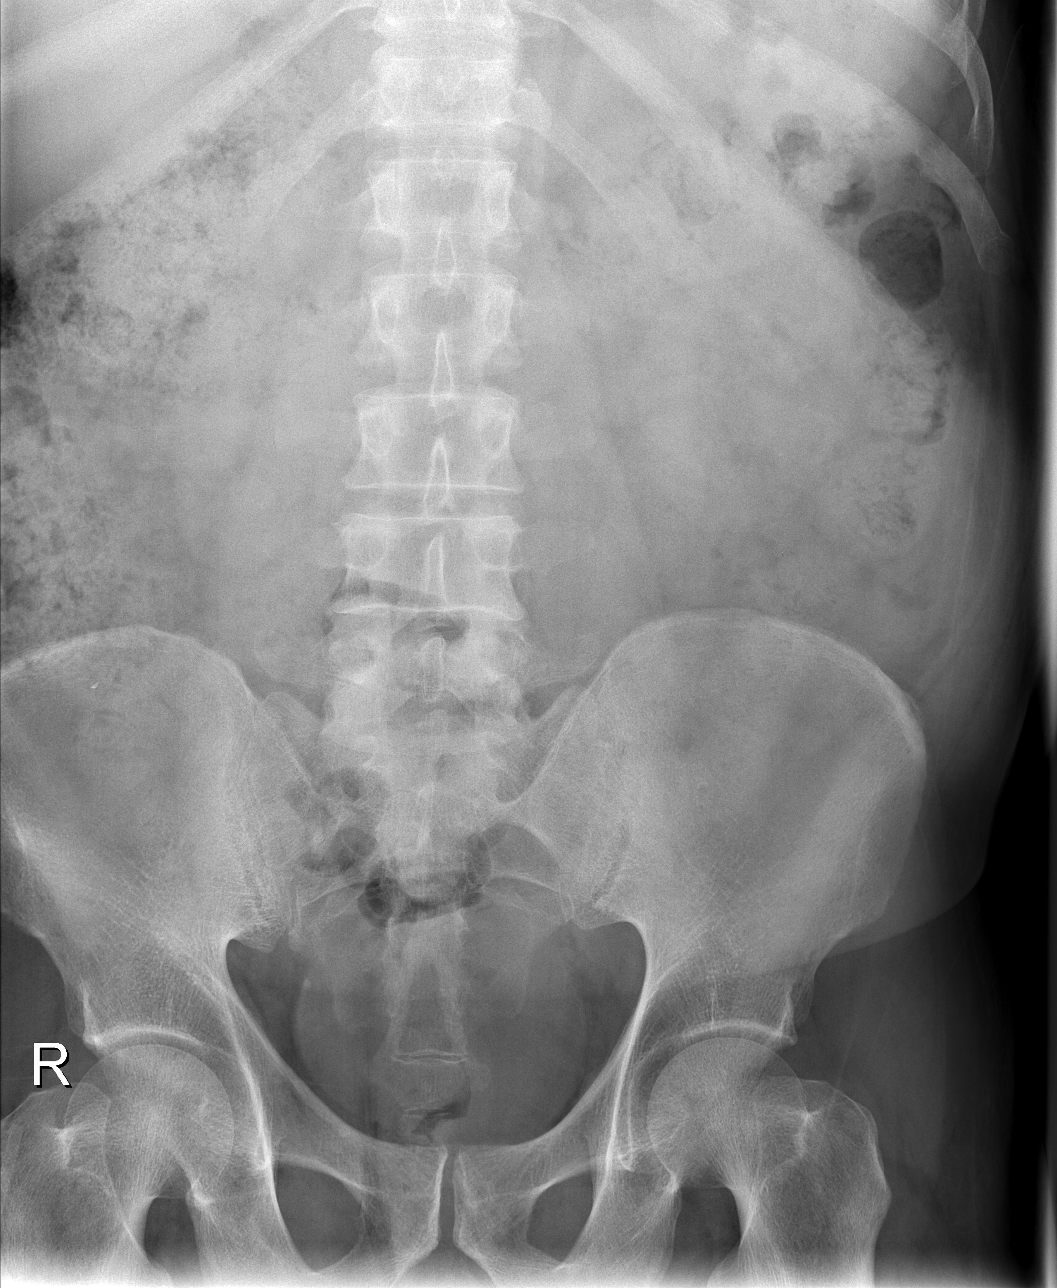

[3 of 3 positions shown; findings below may reference images not displayed]

ABDOMEN ACUTE WITH PA CHEST:

Comparison 04/10/2007

Normal heart size, mediastinal contours, and pulmonary vascularity.
Bronchitic changes without infiltrate or effusion.
No pneumothorax.

Mildly prominent stool proximal colon.
No bowel dilatation, evidence of obstruction, or bowel wall thickening.
No free intraperitoneal air.
Bones unremarkable.
Single small calcification in right pelvis unchanged since previous study,
likely phlebolith.
No definite urinary tract calcification.
IMPRESSION: No acute abnormalities.
Mild bronchitic changes.
Question minimal constipation.

## 2009-06-01 ENCOUNTER — Inpatient Hospital Stay (HOSPITAL_COMMUNITY): Admission: EM | Admit: 2009-06-01 | Discharge: 2009-06-02 | Payer: Self-pay | Admitting: Emergency Medicine

## 2009-06-04 IMAGING — CT CT ABDOMEN W/ CM
1 of 3 series · 14 of 32 positions shown, 19 images · non-contrast
Comparison: none

HISTORY: Abdominal pain, nausea, diarrhea, past history hepatitis and hernia
repair

[Series 2: abd_pel 5.0 b40f st · axial · 0.72mm/px · z∈[-497,-67]mm · 14 of 98 slices shown, 19 images]
[im 6/98  soft-tissue]
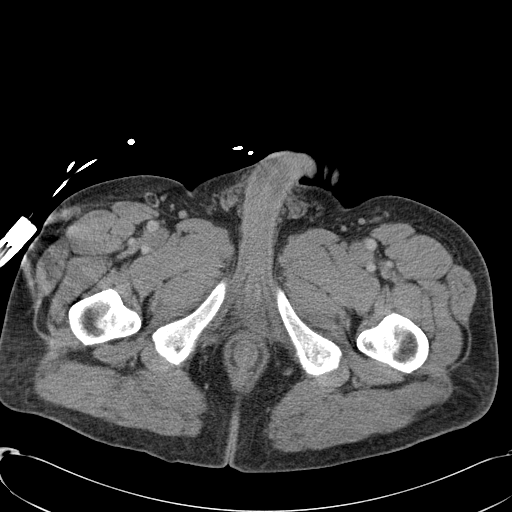
[im 6/98  bone]
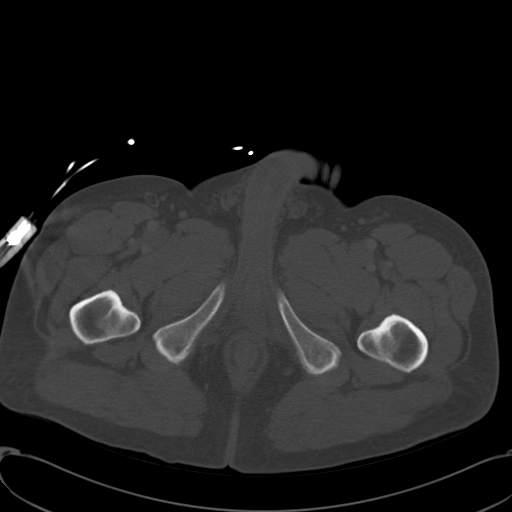
[im 16/98  soft-tissue]
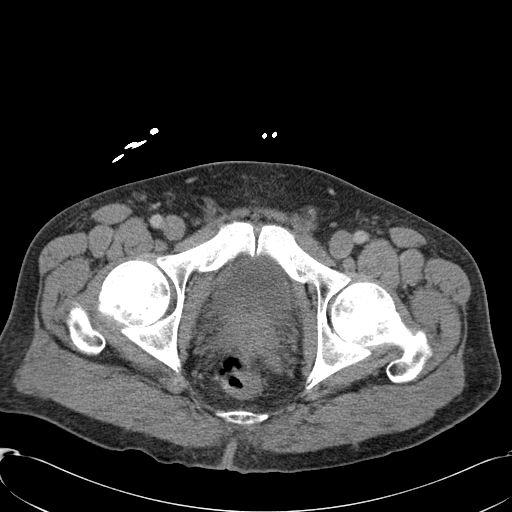
[im 21/98  soft-tissue]
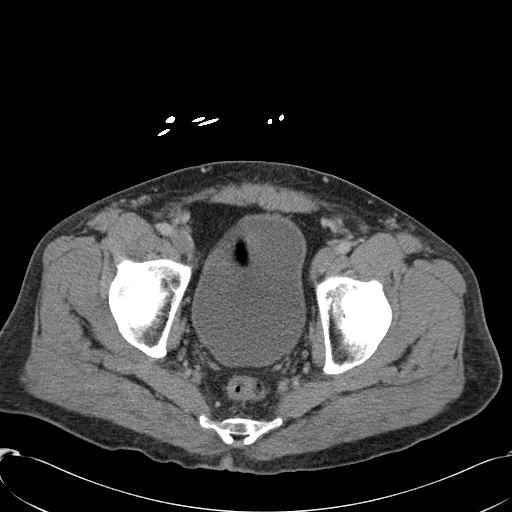
[im 26/98  soft-tissue]
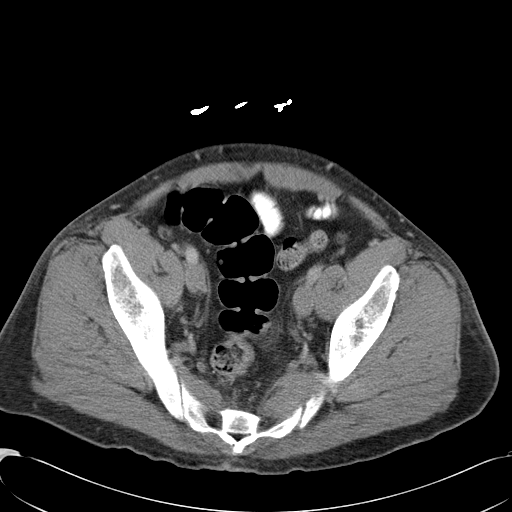
[im 36/98  soft-tissue]
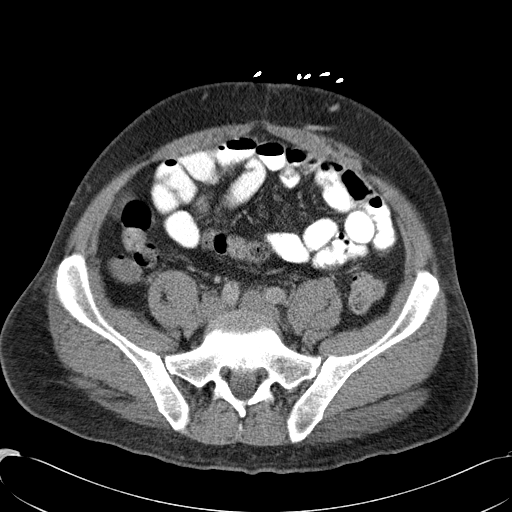
[im 41/98  soft-tissue]
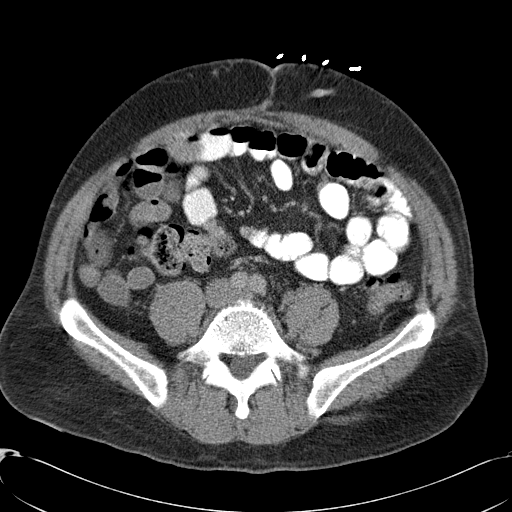
[im 52/98  soft-tissue]
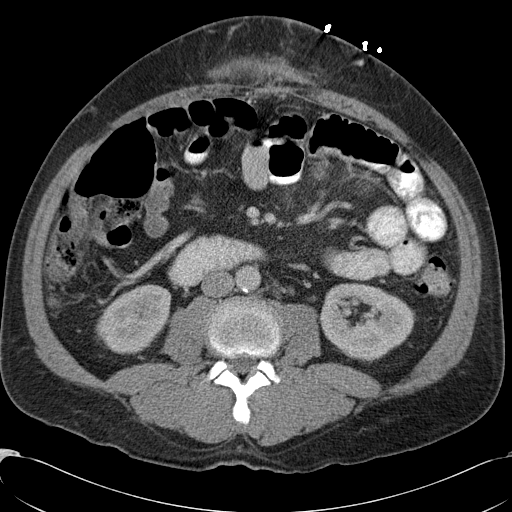
[im 57/98  soft-tissue]
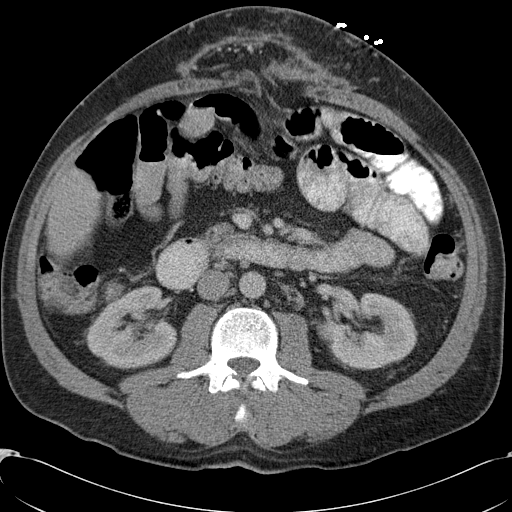
[im 62/98  soft-tissue]
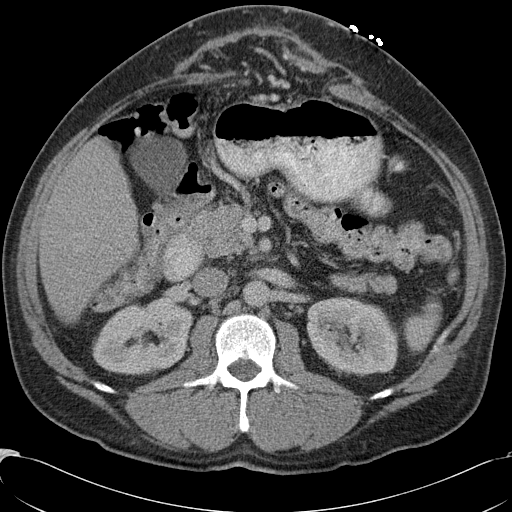
[im 62/98  bone]
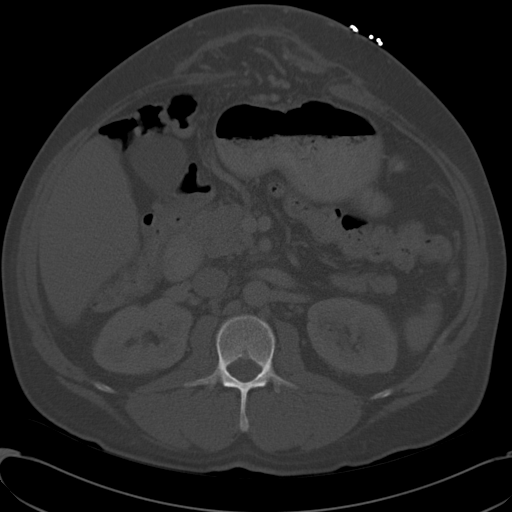
[im 72/98  soft-tissue]
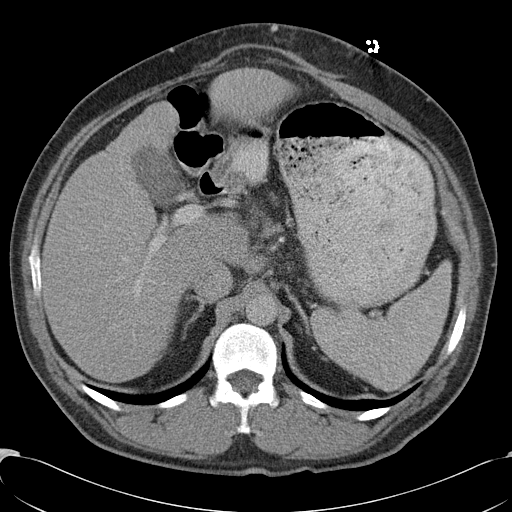
[im 77/98  soft-tissue]
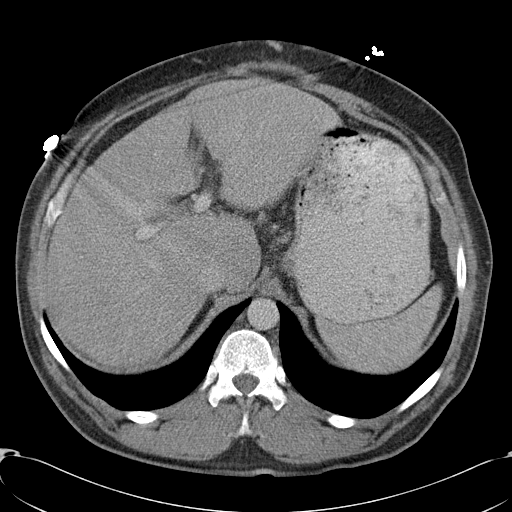
[im 77/98  lung]
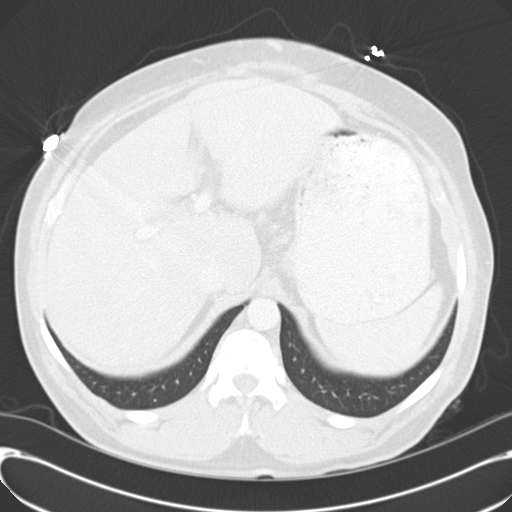
[im 82/98  soft-tissue]
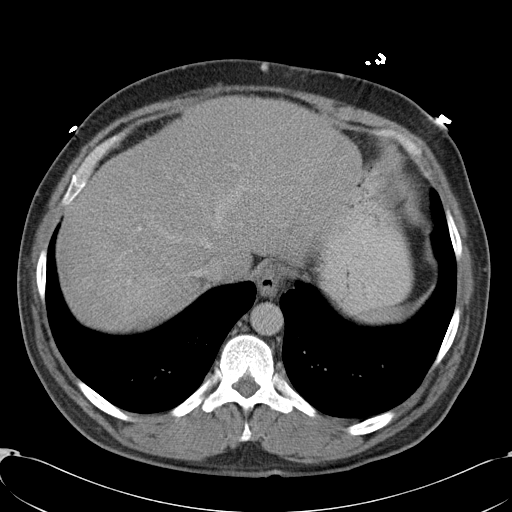
[im 82/98  lung]
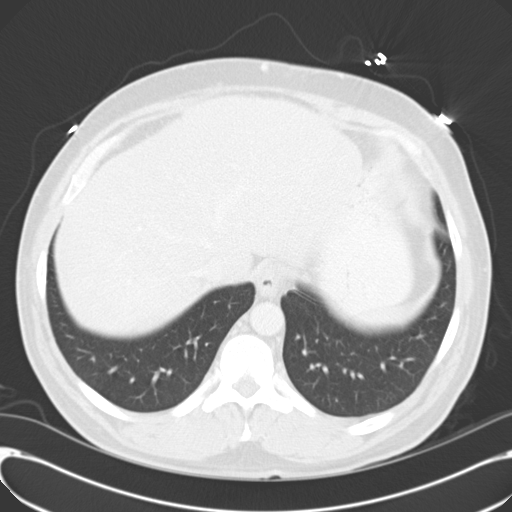
[im 87/98  lung]
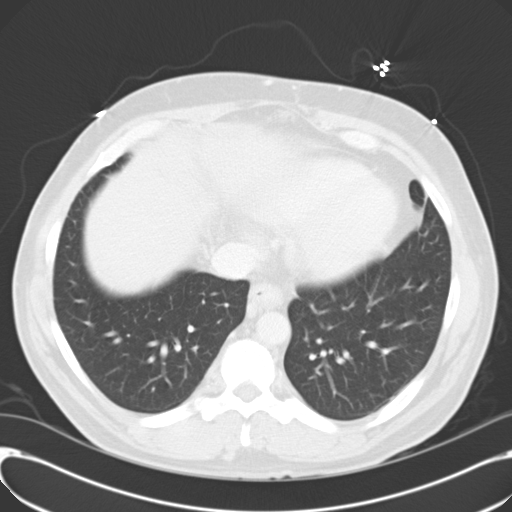
[im 92/98  soft-tissue]
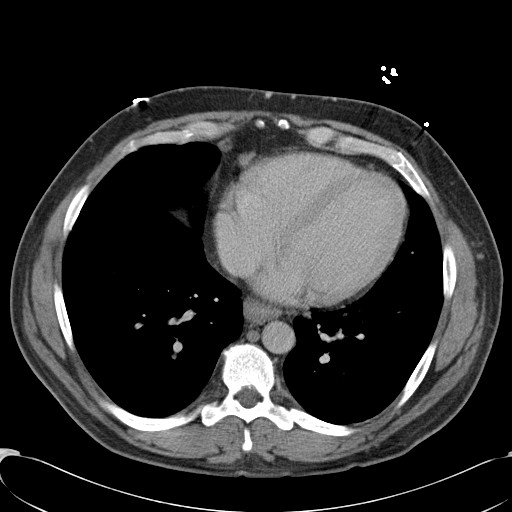
[im 92/98  lung]
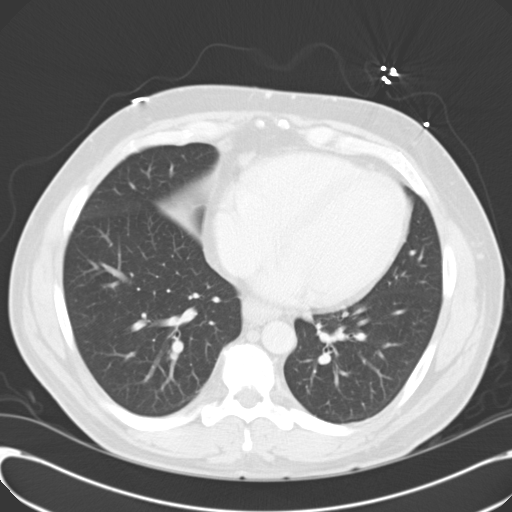

[14 of 32 positions shown; findings below may reference images not displayed]

CT ABDOMEN AND PELVIS WITH CONTRAST:

Multidetector helical CT imaging abdomen and pelvis performed.
Sagittal and coronal images are reconstructed from the axial data set.
Exam utilized dilute oral contrast and 100 cc 5mnipaque-VQQ.
Comparison 04/23/2007, 04/12/2007.

CT ABDOMEN:

Lung bases clear.
Ventral hernia with evidence of prior surgery, fascial defect 6.4 cm transverse.
Spleen, pancreas, kidneys, and adrenal glands normal.
Slightly nodular contours to liver, question cirrhosis, without focal mass.
Numerous normal size and mildly enlarged lymph nodes throughout porta hepatis,
gastrohepatic ligament, extending to peripancreatic and portocaval, largest node
located at porta hepatis and measuring 2.2 x 1.4 cm image 29, not significantly
changed.
Hypervascularity within ventral hernia appears unchanged.
No bowel herniation, bowel wall thickening, or free air.
Slight thickening of wall distal esophagus, little changed.
Small paraesophageal and cardiophrenic angle nodes also identified. 
Stomach and upper abdominal bowel loops unremarkable.
IMPRESSION: Question cirrhotic liver.
Stable upper abdominal adenopathy.
Stable ventral hernia containing fat and multiple vessels.
No acute upper abdominal process.

CT PELVIS:

Large and small bowel loops in pelvis normal.
Normal appearance of bladder and distal ureters.
No pelvic mass, adenopathy, or free fluid.
Bones unremarkable.
IMPRESSION: No acute pelvic abnormalities.

## 2009-06-10 ENCOUNTER — Emergency Department (HOSPITAL_COMMUNITY): Admission: EM | Admit: 2009-06-10 | Discharge: 2009-06-10 | Payer: Self-pay | Admitting: Emergency Medicine

## 2009-06-15 ENCOUNTER — Emergency Department (HOSPITAL_COMMUNITY): Admission: EM | Admit: 2009-06-15 | Discharge: 2009-06-16 | Payer: Self-pay | Admitting: Emergency Medicine

## 2009-06-16 ENCOUNTER — Emergency Department: Payer: Self-pay | Admitting: Emergency Medicine

## 2009-06-17 ENCOUNTER — Emergency Department (HOSPITAL_COMMUNITY): Admission: EM | Admit: 2009-06-17 | Discharge: 2009-06-18 | Payer: Self-pay | Admitting: Emergency Medicine

## 2009-06-24 ENCOUNTER — Emergency Department (HOSPITAL_COMMUNITY): Admission: EM | Admit: 2009-06-24 | Discharge: 2009-06-25 | Payer: Self-pay | Admitting: Emergency Medicine

## 2009-06-26 ENCOUNTER — Emergency Department (HOSPITAL_COMMUNITY): Admission: EM | Admit: 2009-06-26 | Discharge: 2009-06-27 | Payer: Self-pay | Admitting: Emergency Medicine

## 2009-07-15 IMAGING — CR DG CHEST 2V
2 series · 2 of 2 positions shown · non-contrast
Comparison: 04/18/07.

CLINICAL DATA: Recent pneumonia.  
 CHEST ? 2 VIEW:

[w chest pa]
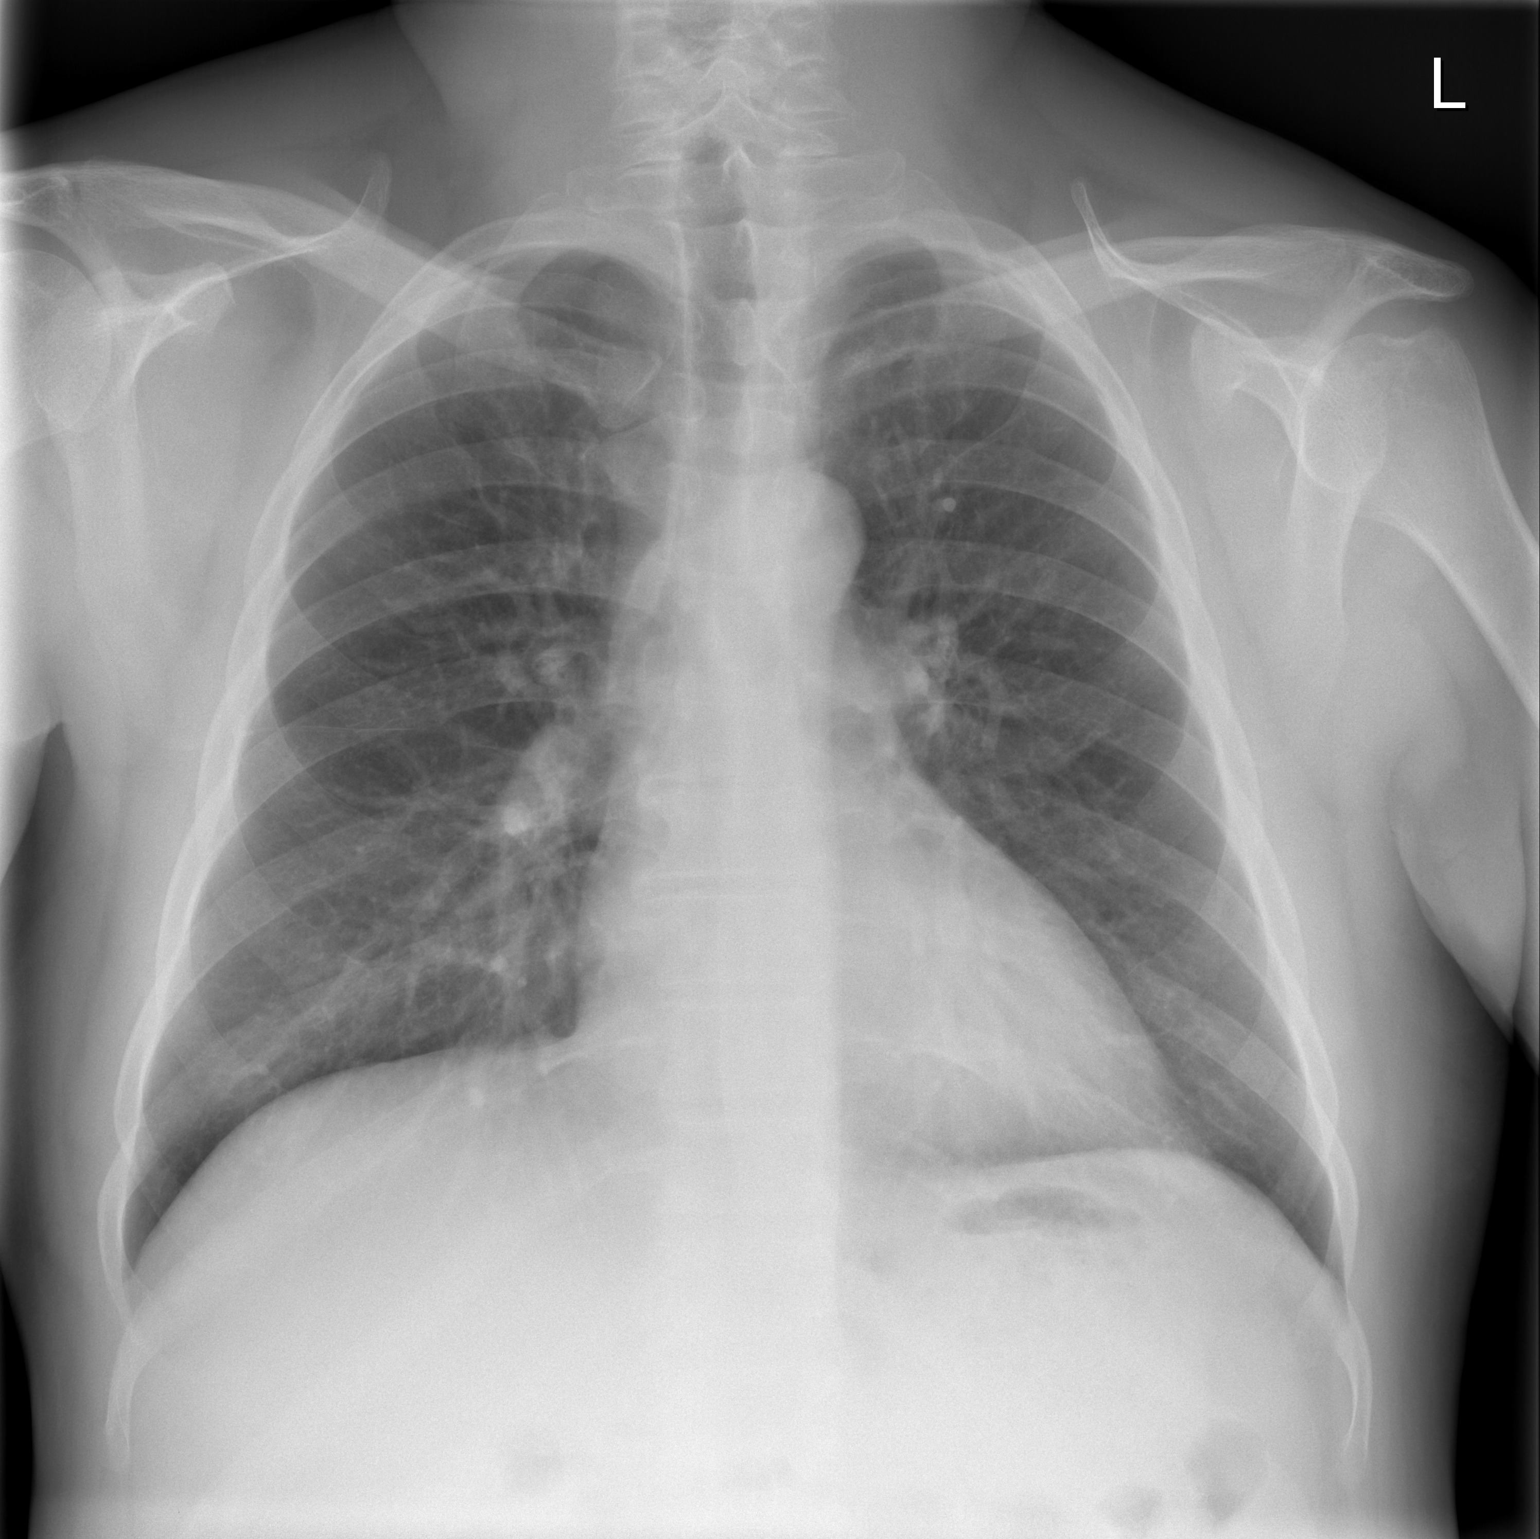

[w chest lat *]
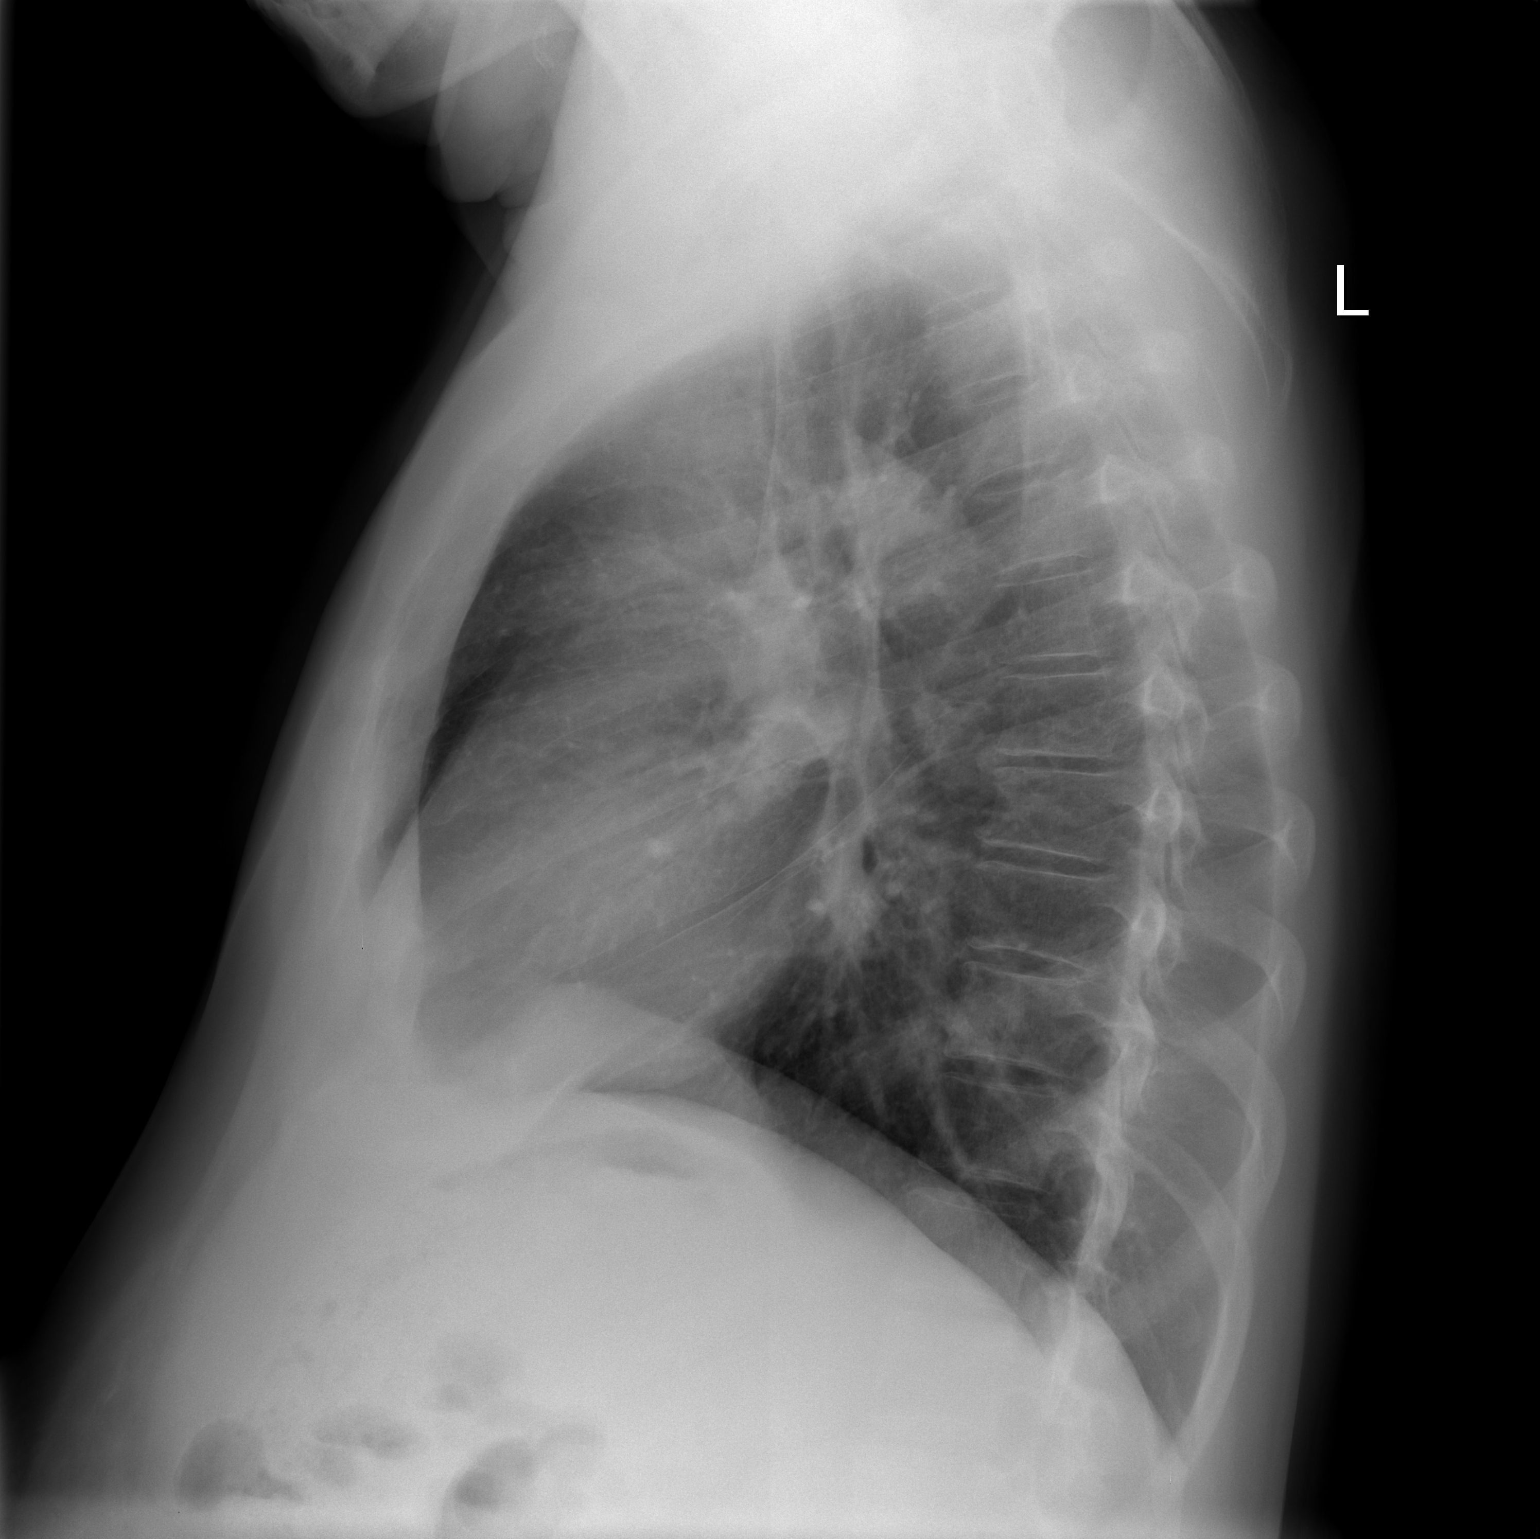

[2 of 2 positions shown; findings below may reference images not displayed]

FINDINGS: Heart size is normal.  
 There is no pleural fluid or pulmonary edema.  
 No airspace opacities are identified.
IMPRESSION: No active disease.

## 2009-07-30 ENCOUNTER — Inpatient Hospital Stay (HOSPITAL_COMMUNITY): Admission: AC | Admit: 2009-07-30 | Discharge: 2009-08-02 | Payer: Self-pay

## 2009-08-10 ENCOUNTER — Inpatient Hospital Stay (HOSPITAL_COMMUNITY): Admission: EM | Admit: 2009-08-10 | Discharge: 2009-08-17 | Payer: Self-pay | Admitting: Unknown Physician Specialty
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
## 2009-08-27 ENCOUNTER — Observation Stay (HOSPITAL_COMMUNITY): Admission: EM | Admit: 2009-08-27 | Discharge: 2009-08-28 | Payer: Self-pay | Admitting: Emergency Medicine

## 2009-08-30 ENCOUNTER — Emergency Department (HOSPITAL_COMMUNITY): Admission: EM | Admit: 2009-08-30 | Discharge: 2009-08-31 | Payer: Self-pay | Admitting: Emergency Medicine

## 2009-09-02 ENCOUNTER — Inpatient Hospital Stay (HOSPITAL_COMMUNITY): Admission: EM | Admit: 2009-09-02 | Discharge: 2009-09-05 | Payer: Self-pay | Admitting: Emergency Medicine

## 2009-09-03 ENCOUNTER — Encounter (INDEPENDENT_AMBULATORY_CARE_PROVIDER_SITE_OTHER): Payer: Self-pay | Admitting: Internal Medicine

## 2009-09-06 ENCOUNTER — Emergency Department (HOSPITAL_COMMUNITY): Admission: EM | Admit: 2009-09-06 | Discharge: 2009-09-06 | Payer: Self-pay | Admitting: *Deleted

## 2009-09-07 ENCOUNTER — Emergency Department (HOSPITAL_COMMUNITY): Admission: EM | Admit: 2009-09-07 | Discharge: 2009-09-07 | Payer: Self-pay | Admitting: Emergency Medicine

## 2009-09-12 IMAGING — CR DG ABDOMEN ACUTE W/ 1V CHEST
4 series · 4 of 4 positions shown · non-contrast
Comparison: none

CLINICAL DATA: Abdominal pain.
 ACUTE ABDOMINAL SERIES WITH CHEST ? 3 VIEW:

[w chest pa]
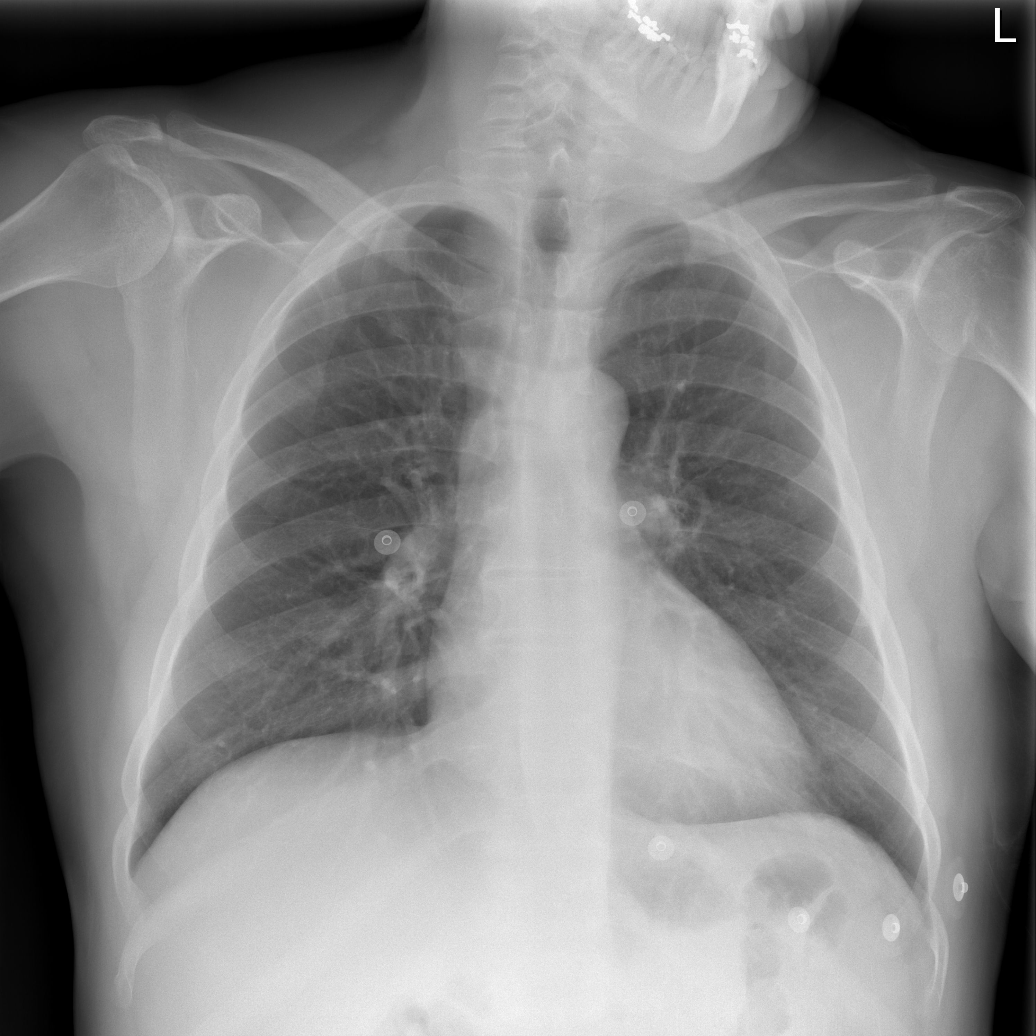

[w abdomen upright *]
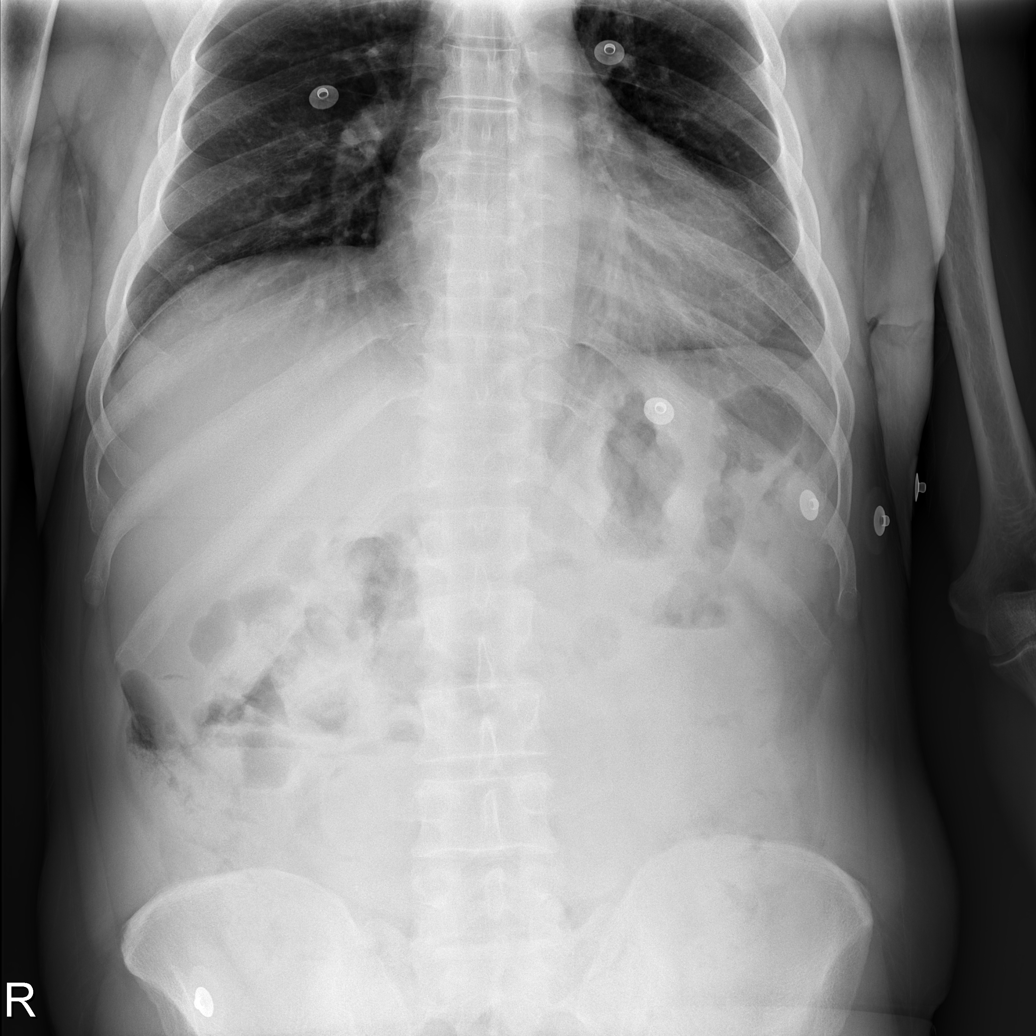

[t abdomen supine (1 of 2)]
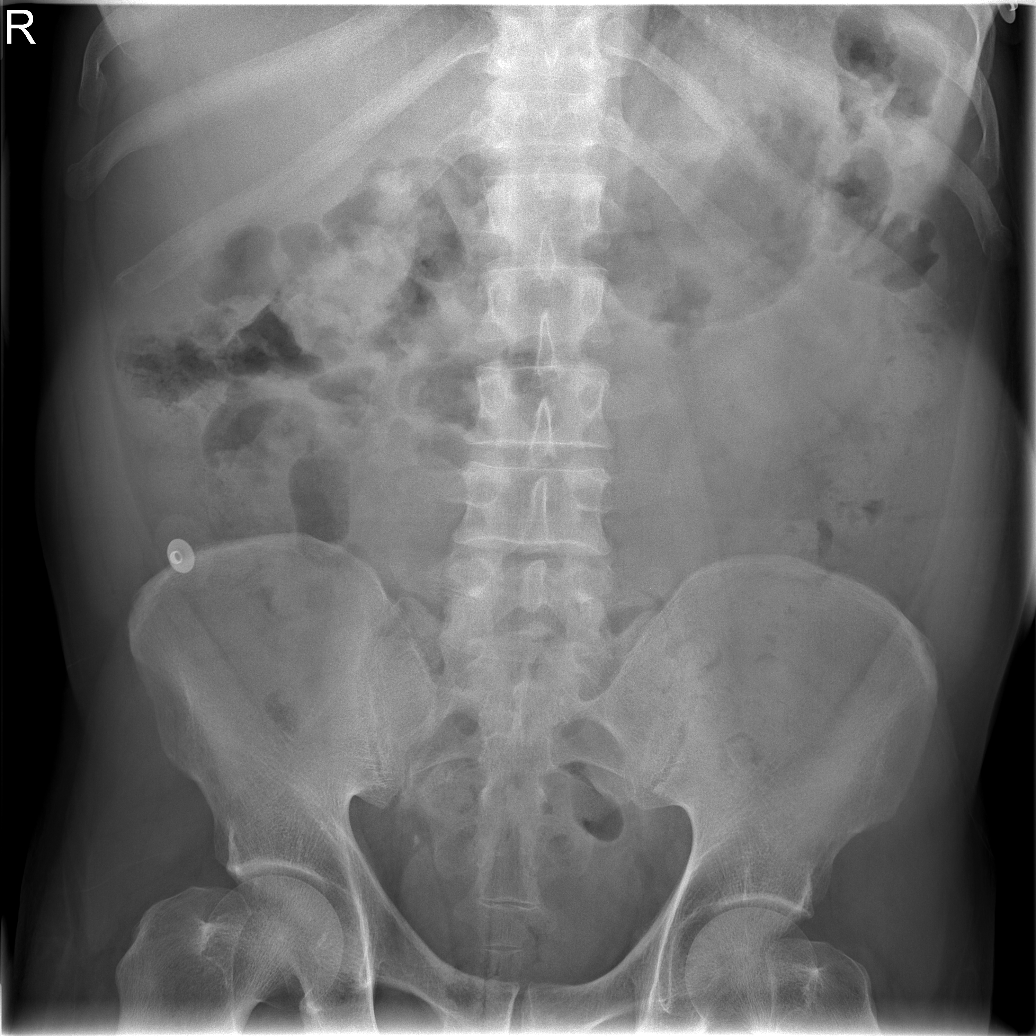

[t abdomen supine (2 of 2)]
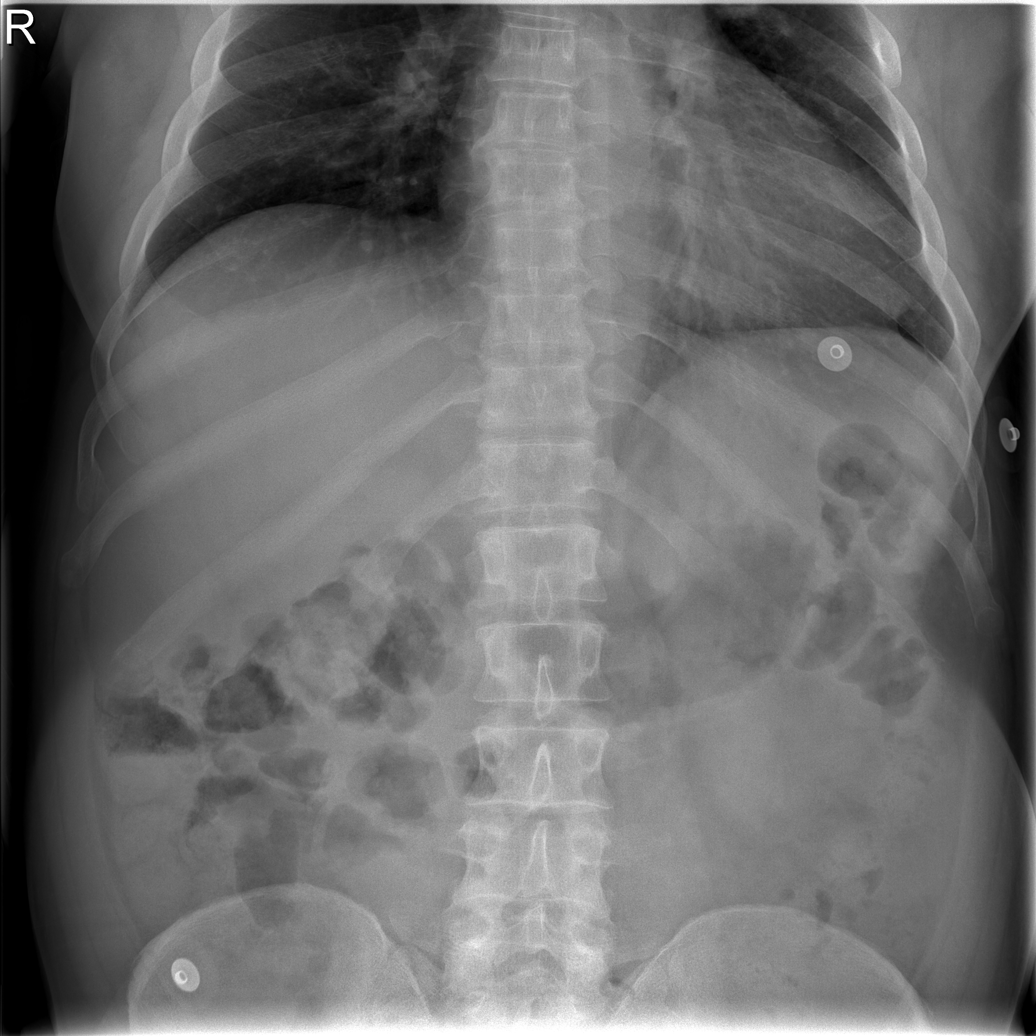

[4 of 4 positions shown; findings below may reference images not displayed]

FINDINGS: The cardiomediastinal silhouette is unremarkable.  The lungs are clear.  The bowel gas pattern is nonspecific with gas-filled nondistended small bowel loops in the right abdomen, which likely represent focal ileus.  No evidence of bowel obstruction or pneumoperitoneum.
IMPRESSION: 1. Nonspecific, nonobstructive bowel gas pattern with probable mild focal small bowel ileus in the right abdomen.
 2. No evidence of acute cardiopulmonary disease.

## 2009-09-13 ENCOUNTER — Emergency Department (HOSPITAL_COMMUNITY): Admission: EM | Admit: 2009-09-13 | Discharge: 2009-09-14 | Payer: Self-pay | Admitting: Emergency Medicine

## 2009-09-22 ENCOUNTER — Emergency Department (HOSPITAL_COMMUNITY): Admission: EM | Admit: 2009-09-22 | Discharge: 2009-09-23 | Payer: Self-pay | Admitting: Emergency Medicine
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
## 2009-09-27 ENCOUNTER — Emergency Department (HOSPITAL_COMMUNITY): Admission: EM | Admit: 2009-09-27 | Discharge: 2009-09-28 | Payer: Self-pay | Admitting: Emergency Medicine

## 2009-09-28 ENCOUNTER — Inpatient Hospital Stay (HOSPITAL_COMMUNITY): Admission: AD | Admit: 2009-09-28 | Discharge: 2009-10-01 | Payer: Self-pay | Admitting: Psychiatry

## 2009-09-28 ENCOUNTER — Ambulatory Visit: Payer: Self-pay | Admitting: Psychiatry

## 2009-10-01 ENCOUNTER — Emergency Department (HOSPITAL_COMMUNITY): Admission: EM | Admit: 2009-10-01 | Discharge: 2009-10-01 | Payer: Self-pay | Admitting: Emergency Medicine

## 2009-10-06 ENCOUNTER — Emergency Department (HOSPITAL_COMMUNITY): Admission: EM | Admit: 2009-10-06 | Discharge: 2009-10-06 | Payer: Self-pay | Admitting: Emergency Medicine
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
## 2009-10-13 ENCOUNTER — Inpatient Hospital Stay (HOSPITAL_COMMUNITY): Admission: EM | Admit: 2009-10-13 | Discharge: 2009-10-17 | Payer: Self-pay | Admitting: Emergency Medicine

## 2009-10-18 ENCOUNTER — Inpatient Hospital Stay (HOSPITAL_COMMUNITY): Admission: EM | Admit: 2009-10-18 | Discharge: 2009-10-21 | Payer: Self-pay | Admitting: Emergency Medicine

## 2009-10-22 ENCOUNTER — Inpatient Hospital Stay (HOSPITAL_COMMUNITY): Admission: EM | Admit: 2009-10-22 | Discharge: 2009-10-24 | Payer: Self-pay | Admitting: Emergency Medicine

## 2009-10-24 ENCOUNTER — Emergency Department (HOSPITAL_COMMUNITY): Admission: EM | Admit: 2009-10-24 | Discharge: 2009-10-24 | Payer: Self-pay | Admitting: Emergency Medicine

## 2009-10-28 ENCOUNTER — Inpatient Hospital Stay (HOSPITAL_COMMUNITY): Admission: EM | Admit: 2009-10-28 | Discharge: 2009-10-30 | Payer: Self-pay | Admitting: Emergency Medicine

## 2009-11-01 ENCOUNTER — Telehealth: Payer: Self-pay | Admitting: Physician Assistant

## 2009-11-01 DIAGNOSIS — K7460 Unspecified cirrhosis of liver: Secondary | ICD-10-CM | POA: Insufficient documentation

## 2009-11-01 DIAGNOSIS — F329 Major depressive disorder, single episode, unspecified: Secondary | ICD-10-CM | POA: Insufficient documentation

## 2009-11-01 DIAGNOSIS — B1710 Acute hepatitis C without hepatic coma: Secondary | ICD-10-CM | POA: Insufficient documentation

## 2009-11-01 DIAGNOSIS — I10 Essential (primary) hypertension: Secondary | ICD-10-CM | POA: Insufficient documentation

## 2009-11-07 ENCOUNTER — Emergency Department (HOSPITAL_COMMUNITY): Admission: EM | Admit: 2009-11-07 | Discharge: 2009-11-08 | Payer: Self-pay | Admitting: Emergency Medicine

## 2009-11-08 ENCOUNTER — Encounter: Payer: Self-pay | Admitting: Emergency Medicine

## 2009-11-09 ENCOUNTER — Inpatient Hospital Stay (HOSPITAL_COMMUNITY): Admission: EM | Admit: 2009-11-09 | Discharge: 2009-11-15 | Payer: Self-pay | Admitting: Psychiatry

## 2009-11-09 ENCOUNTER — Ambulatory Visit: Payer: Self-pay | Admitting: Psychiatry

## 2009-11-15 ENCOUNTER — Emergency Department (HOSPITAL_COMMUNITY): Admission: EM | Admit: 2009-11-15 | Discharge: 2009-11-15 | Payer: Self-pay | Admitting: Emergency Medicine

## 2009-11-17 ENCOUNTER — Emergency Department (HOSPITAL_COMMUNITY): Admission: EM | Admit: 2009-11-17 | Discharge: 2009-11-17 | Payer: Self-pay | Admitting: Emergency Medicine

## 2009-11-21 ENCOUNTER — Emergency Department (HOSPITAL_COMMUNITY): Admission: EM | Admit: 2009-11-21 | Discharge: 2009-11-24 | Payer: Self-pay | Admitting: Emergency Medicine

## 2009-11-25 ENCOUNTER — Emergency Department (HOSPITAL_COMMUNITY): Admission: EM | Admit: 2009-11-25 | Discharge: 2009-11-26 | Payer: Self-pay | Admitting: Emergency Medicine

## 2009-11-27 ENCOUNTER — Emergency Department (HOSPITAL_COMMUNITY): Admission: EM | Admit: 2009-11-27 | Discharge: 2009-11-27 | Payer: Self-pay | Admitting: Emergency Medicine

## 2009-12-08 ENCOUNTER — Emergency Department (HOSPITAL_COMMUNITY): Admission: EM | Admit: 2009-12-08 | Discharge: 2009-12-08 | Payer: Self-pay | Admitting: Emergency Medicine

## 2009-12-11 ENCOUNTER — Emergency Department (HOSPITAL_COMMUNITY): Admission: EM | Admit: 2009-12-11 | Discharge: 2009-12-12 | Payer: Self-pay | Admitting: Emergency Medicine

## 2009-12-12 ENCOUNTER — Ambulatory Visit: Payer: Self-pay | Admitting: Psychiatry

## 2009-12-12 ENCOUNTER — Inpatient Hospital Stay (HOSPITAL_COMMUNITY): Admission: AD | Admit: 2009-12-12 | Discharge: 2009-12-14 | Payer: Self-pay | Admitting: Psychiatry

## 2009-12-14 ENCOUNTER — Emergency Department (HOSPITAL_COMMUNITY): Admission: EM | Admit: 2009-12-14 | Discharge: 2009-12-15 | Payer: Self-pay | Admitting: Emergency Medicine
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
## 2010-01-12 ENCOUNTER — Emergency Department (HOSPITAL_COMMUNITY): Admission: EM | Admit: 2010-01-12 | Discharge: 2010-01-12 | Payer: Self-pay | Admitting: Emergency Medicine

## 2010-01-25 ENCOUNTER — Ambulatory Visit: Payer: Self-pay | Admitting: Family Medicine

## 2010-01-25 ENCOUNTER — Observation Stay (HOSPITAL_COMMUNITY): Admission: EM | Admit: 2010-01-25 | Discharge: 2010-01-27 | Payer: Self-pay | Admitting: Emergency Medicine

## 2010-01-29 ENCOUNTER — Other Ambulatory Visit: Payer: Self-pay

## 2010-01-29 ENCOUNTER — Other Ambulatory Visit: Payer: Self-pay | Admitting: Emergency Medicine

## 2010-01-30 ENCOUNTER — Ambulatory Visit: Payer: Self-pay | Admitting: Psychiatry

## 2010-01-30 ENCOUNTER — Other Ambulatory Visit: Payer: Self-pay | Admitting: Emergency Medicine

## 2010-01-30 ENCOUNTER — Inpatient Hospital Stay (HOSPITAL_COMMUNITY): Admission: RE | Admit: 2010-01-30 | Discharge: 2010-02-04 | Payer: Self-pay | Admitting: Psychiatry
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
## 2010-02-06 ENCOUNTER — Observation Stay (HOSPITAL_COMMUNITY): Admission: EM | Admit: 2010-02-06 | Discharge: 2010-02-08 | Payer: Self-pay | Admitting: Emergency Medicine

## 2010-02-13 IMAGING — CT CT PELVIS W/ CM
2 of 5 series · 17 of 46 positions shown, 19 images · IV contrast (OMNI 300/WATER & 100 ML OMNI 300)
Comparison: 05/13/2007

CT ABDOMEN

CLINICAL DATA: Abdominal pain with elevated LFTs.  Nausea and
vomiting

CT ABDOMEN AND PELVIS WITH CONTRAST
TECHNIQUE: Multidetector CT imaging of the abdomen and pelvis was
performed using the standard protocol following bolus
administration of intravenous contrast.
Contrast: 100 ml Mmnipaque-XQQ

[Series 2: routine abdomen · axial · 0.84mm/px · z∈[-480,-54]mm · 14 of 95 slices shown, 16 images]
[im 5/95  soft-tissue]
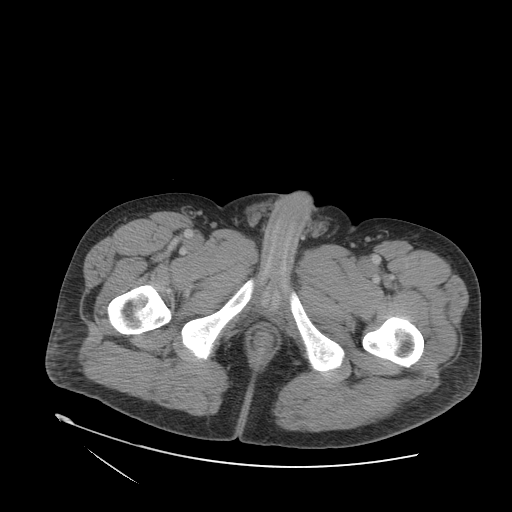
[im 5/95  bone]
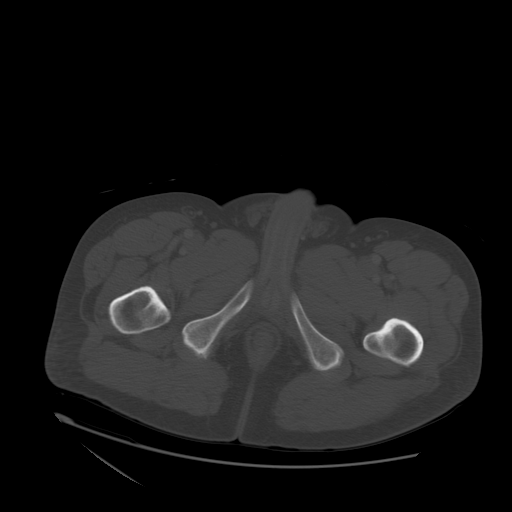
[im 10/95  soft-tissue]
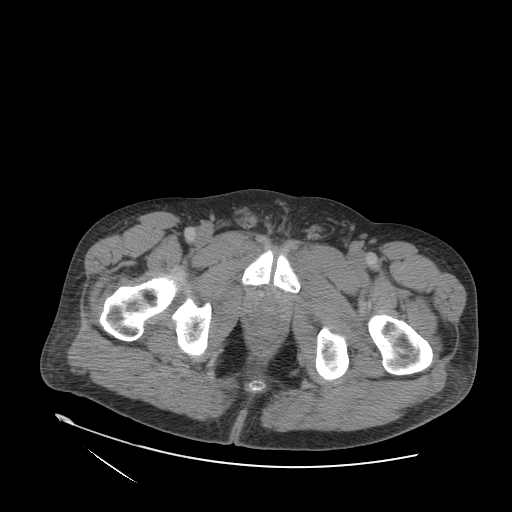
[im 20/95  soft-tissue]
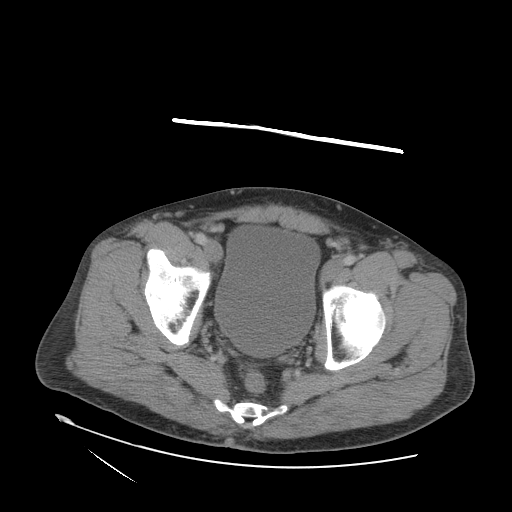
[im 25/95  soft-tissue]
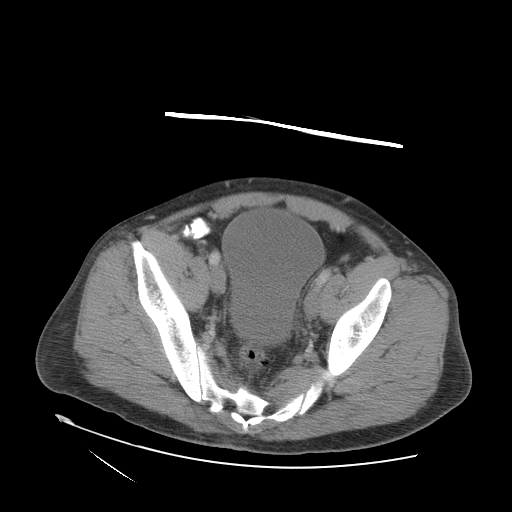
[im 30/95  soft-tissue]
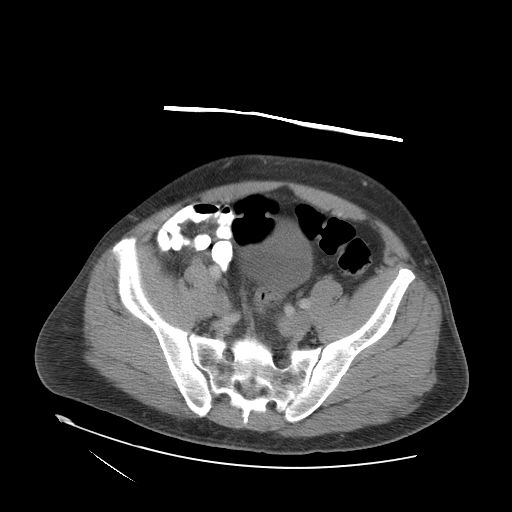
[im 40/95  soft-tissue]
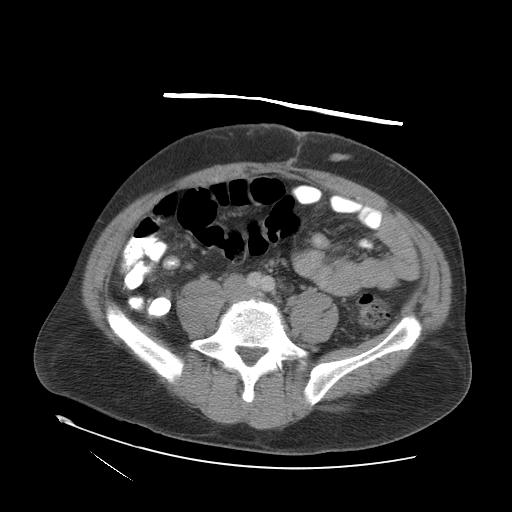
[im 45/95  soft-tissue]
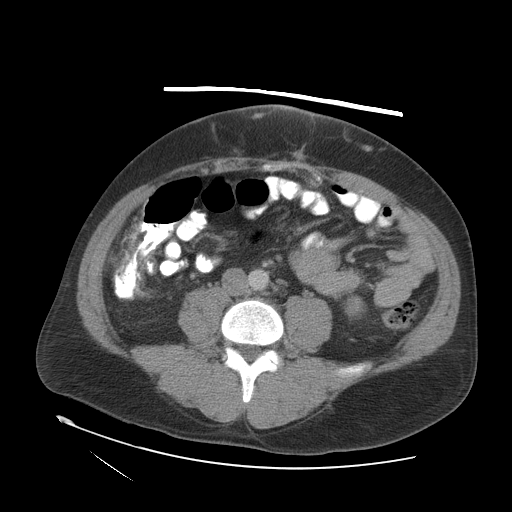
[im 50/95  soft-tissue]
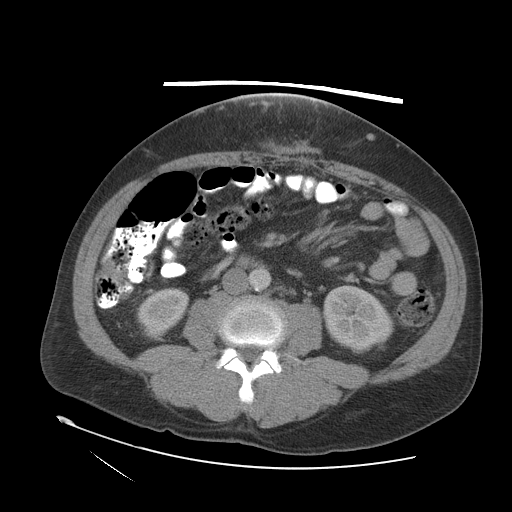
[im 55/95  soft-tissue]
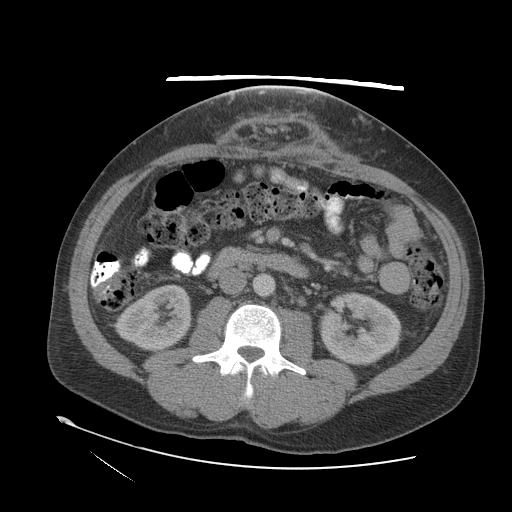
[im 55/95  bone]
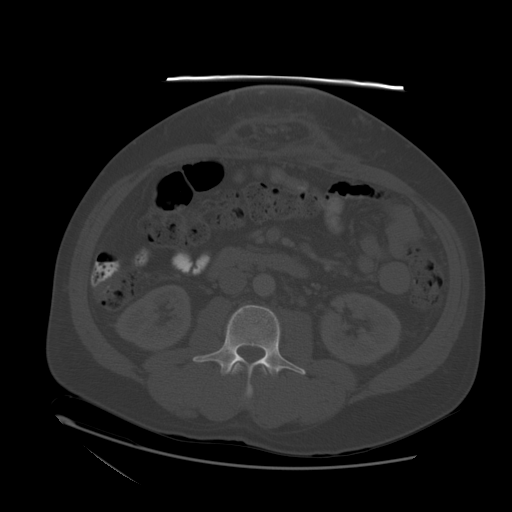
[im 65/95  soft-tissue]
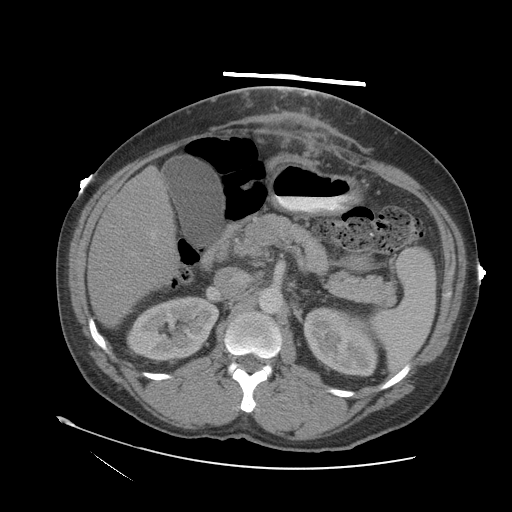
[im 70/95  soft-tissue]
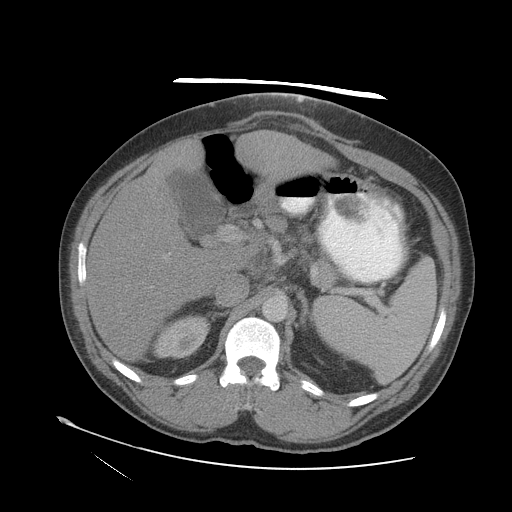
[im 75/95  soft-tissue]
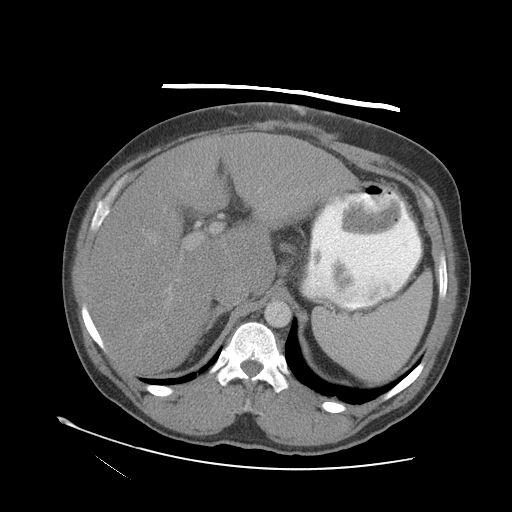
[im 85/95  soft-tissue]
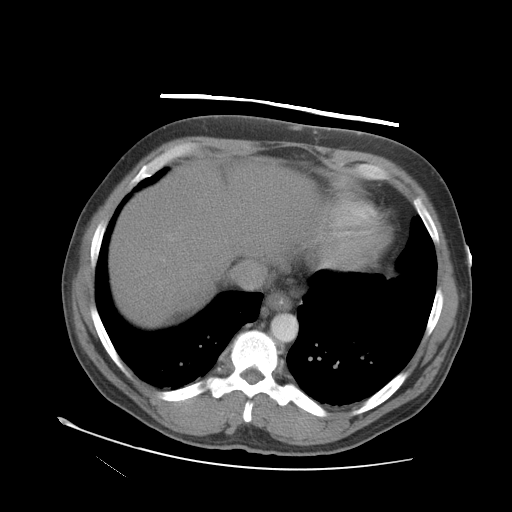
[im 90/95  soft-tissue]
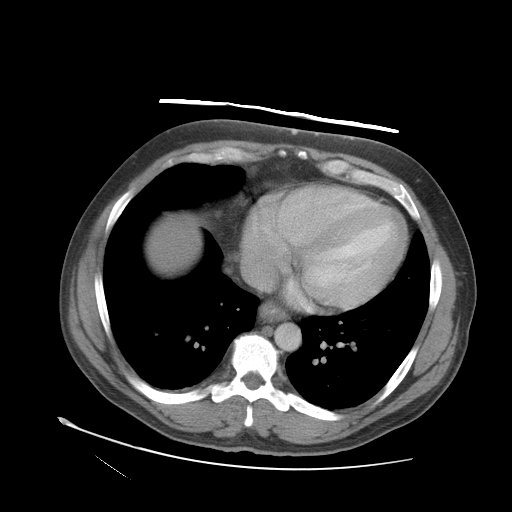

[Series 401: cor abd/pel · coronal · 0.94mm/px · 3 of 148 slices shown]
[im 50/148  soft-tissue]
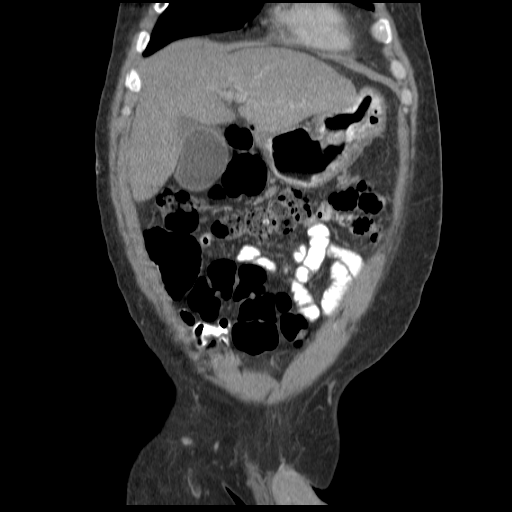
[im 66/148  soft-tissue]
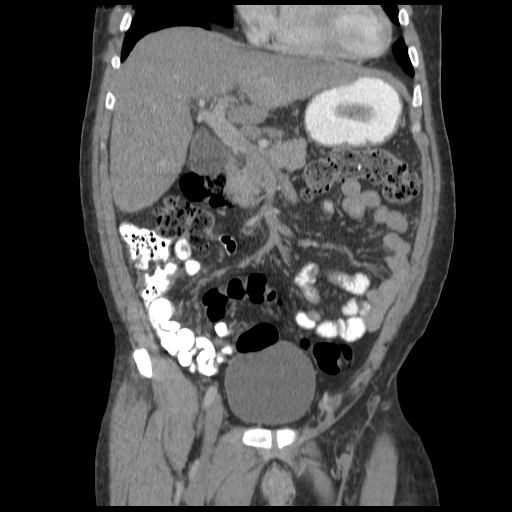
[im 82/148  soft-tissue]
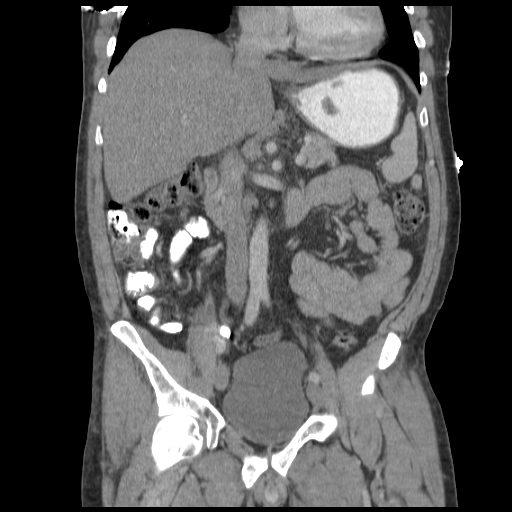

[17 of 46 positions shown; findings below may reference images not displayed]

FINDINGS: No focal abnormality is seen in the liver or spleen.
Diffuse fatty infiltration of the liver is evident.  The stomach is
distended.  Duodenum, pancreas, and adrenal glands are
unremarkable.  The gallbladder is distended with a small amount of
fluid noted in the gallbladder fossa.  The gastrohepatic and
hepatoduodenal ligament lymphadenopathy seen on the previous study
persists without substantial interval change. Kidneys are
unremarkable.

The patient appears to have had prior ventral hernia repair with
recurrence.  The omental fat extends into the recurrent hernia, but
there is no protruding bowel. Small bowel loops are not dilated.
Abdominal segments of the colon have normal features.
IMPRESSION: A small amount of fluid adjacent to the gallbladder is unchanged in
the interval since the most recent comparison study.

Diffuse fatty infiltration of the liver.

Recurrent ventral hernia without complicating features.

CT PELVIS
FINDINGS: There is no intraperitoneal free fluid.  No pelvic
sidewall lymphadenopathy.  Bladder is distended.  Terminal ileum is
normal.  The appendix is not visualized. Colon is diffusely stool-
filled, but unremarkable otherwise.

Bone windows are unremarkable.
IMPRESSION: No acute findings in the anatomic pelvis.
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
## 2010-02-19 ENCOUNTER — Inpatient Hospital Stay (HOSPITAL_COMMUNITY): Admission: EM | Admit: 2010-02-19 | Discharge: 2010-02-22 | Payer: Self-pay | Admitting: Emergency Medicine

## 2010-02-22 ENCOUNTER — Emergency Department (HOSPITAL_COMMUNITY): Admission: EM | Admit: 2010-02-22 | Discharge: 2010-02-22 | Payer: Self-pay | Admitting: Emergency Medicine

## 2010-02-25 ENCOUNTER — Encounter (INDEPENDENT_AMBULATORY_CARE_PROVIDER_SITE_OTHER): Payer: Self-pay | Admitting: Internal Medicine

## 2010-03-01 ENCOUNTER — Emergency Department (HOSPITAL_COMMUNITY): Admission: EM | Admit: 2010-03-01 | Discharge: 2010-03-02 | Payer: Self-pay | Admitting: Emergency Medicine

## 2010-03-03 ENCOUNTER — Inpatient Hospital Stay (HOSPITAL_COMMUNITY): Admission: EM | Admit: 2010-03-03 | Discharge: 2010-03-05 | Payer: Self-pay | Admitting: Emergency Medicine

## 2010-03-04 ENCOUNTER — Ambulatory Visit: Payer: Self-pay | Admitting: Psychiatry

## 2010-03-08 ENCOUNTER — Emergency Department (HOSPITAL_COMMUNITY): Admission: EM | Admit: 2010-03-08 | Discharge: 2010-03-08 | Payer: Self-pay | Admitting: Emergency Medicine

## 2010-03-09 ENCOUNTER — Other Ambulatory Visit: Payer: Self-pay

## 2010-03-10 ENCOUNTER — Ambulatory Visit: Payer: Self-pay | Admitting: Psychiatry

## 2010-03-10 ENCOUNTER — Inpatient Hospital Stay (HOSPITAL_COMMUNITY): Admission: RE | Admit: 2010-03-10 | Discharge: 2010-03-13 | Payer: Self-pay | Admitting: Psychiatry

## 2010-03-14 ENCOUNTER — Emergency Department (HOSPITAL_COMMUNITY): Admission: EM | Admit: 2010-03-14 | Discharge: 2010-03-16 | Payer: Self-pay | Admitting: Emergency Medicine

## 2010-03-17 ENCOUNTER — Emergency Department (HOSPITAL_COMMUNITY): Admission: EM | Admit: 2010-03-17 | Discharge: 2010-03-17 | Payer: Self-pay | Admitting: Emergency Medicine

## 2010-03-21 ENCOUNTER — Emergency Department (HOSPITAL_COMMUNITY): Admission: EM | Admit: 2010-03-21 | Discharge: 2010-03-21 | Payer: Self-pay | Admitting: Emergency Medicine

## 2010-03-23 ENCOUNTER — Emergency Department (HOSPITAL_COMMUNITY): Admission: EM | Admit: 2010-03-23 | Discharge: 2010-04-02 | Payer: Self-pay | Admitting: Emergency Medicine

## 2010-03-23 DIAGNOSIS — F1020 Alcohol dependence, uncomplicated: Secondary | ICD-10-CM

## 2010-03-24 DIAGNOSIS — F1020 Alcohol dependence, uncomplicated: Secondary | ICD-10-CM

## 2010-03-25 DIAGNOSIS — F1020 Alcohol dependence, uncomplicated: Secondary | ICD-10-CM

## 2010-03-26 DIAGNOSIS — F331 Major depressive disorder, recurrent, moderate: Secondary | ICD-10-CM

## 2010-03-31 ENCOUNTER — Ambulatory Visit: Payer: Self-pay | Admitting: Psychiatry

## 2010-04-01 ENCOUNTER — Ambulatory Visit: Payer: Self-pay | Admitting: Psychiatry

## 2010-04-06 ENCOUNTER — Emergency Department (HOSPITAL_COMMUNITY): Admission: EM | Admit: 2010-04-06 | Discharge: 2010-04-06 | Payer: Self-pay | Admitting: Emergency Medicine

## 2010-04-07 ENCOUNTER — Ambulatory Visit: Payer: Self-pay | Admitting: Psychiatry

## 2010-04-08 ENCOUNTER — Ambulatory Visit: Payer: Self-pay | Admitting: Psychiatry

## 2010-04-23 ENCOUNTER — Encounter: Payer: Self-pay | Admitting: Physician Assistant

## 2010-04-23 ENCOUNTER — Inpatient Hospital Stay (HOSPITAL_COMMUNITY): Admission: EM | Admit: 2010-04-23 | Discharge: 2010-04-26 | Payer: Self-pay | Admitting: Emergency Medicine

## 2010-04-24 ENCOUNTER — Ambulatory Visit: Payer: Self-pay | Admitting: Psychiatry

## 2010-04-29 ENCOUNTER — Emergency Department (HOSPITAL_COMMUNITY): Admission: EM | Admit: 2010-04-29 | Discharge: 2010-04-30 | Payer: Self-pay | Admitting: Emergency Medicine
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
## 2010-05-04 ENCOUNTER — Emergency Department (HOSPITAL_COMMUNITY): Admission: EM | Admit: 2010-05-04 | Discharge: 2010-05-05 | Payer: Self-pay | Admitting: Emergency Medicine

## 2010-05-11 ENCOUNTER — Emergency Department (HOSPITAL_COMMUNITY): Admission: EM | Admit: 2010-05-11 | Discharge: 2010-05-11 | Payer: Self-pay | Admitting: Emergency Medicine

## 2010-05-12 ENCOUNTER — Ambulatory Visit: Payer: Self-pay | Admitting: Physician Assistant

## 2010-05-12 DIAGNOSIS — K226 Gastro-esophageal laceration-hemorrhage syndrome: Secondary | ICD-10-CM | POA: Insufficient documentation

## 2010-05-12 DIAGNOSIS — K208 Other esophagitis: Secondary | ICD-10-CM

## 2010-05-12 DIAGNOSIS — D696 Thrombocytopenia, unspecified: Secondary | ICD-10-CM | POA: Insufficient documentation

## 2010-05-12 DIAGNOSIS — D649 Anemia, unspecified: Secondary | ICD-10-CM

## 2010-05-12 DIAGNOSIS — F1010 Alcohol abuse, uncomplicated: Secondary | ICD-10-CM | POA: Insufficient documentation

## 2010-05-12 DIAGNOSIS — R066 Hiccough: Secondary | ICD-10-CM

## 2010-05-12 DIAGNOSIS — Z8679 Personal history of other diseases of the circulatory system: Secondary | ICD-10-CM | POA: Insufficient documentation

## 2010-05-12 LAB — CONVERTED CEMR LAB
Bilirubin Urine: NEGATIVE
Ketones, urine, test strip: NEGATIVE
Nitrite: NEGATIVE
Protein, U semiquant: NEGATIVE
Urobilinogen, UA: 0.2

## 2010-05-13 ENCOUNTER — Emergency Department (HOSPITAL_COMMUNITY): Admission: EM | Admit: 2010-05-13 | Discharge: 2010-05-13 | Payer: Self-pay | Admitting: Emergency Medicine

## 2010-05-13 LAB — CONVERTED CEMR LAB
Albumin: 3.8 g/dL (ref 3.5–5.2)
Alkaline Phosphatase: 53 units/L (ref 39–117)
Basophils Absolute: 0.1 10*3/uL (ref 0.0–0.1)
CO2: 25 meq/L (ref 19–32)
Chloride: 108 meq/L (ref 96–112)
Glucose, Bld: 82 mg/dL (ref 70–99)
Hemoglobin: 10 g/dL — ABNORMAL LOW (ref 13.0–17.0)
Lymphocytes Relative: 43 % (ref 12–46)
Lymphs Abs: 1.7 10*3/uL (ref 0.7–4.0)
Monocytes Absolute: 0.6 10*3/uL (ref 0.1–1.0)
Monocytes Relative: 15 % — ABNORMAL HIGH (ref 3–12)
Neutro Abs: 1.6 10*3/uL — ABNORMAL LOW (ref 1.7–7.7)
Potassium: 4.2 meq/L (ref 3.5–5.3)
RBC: 4.15 M/uL — ABNORMAL LOW (ref 4.22–5.81)
Sodium: 142 meq/L (ref 135–145)
Total Protein: 7.7 g/dL (ref 6.0–8.3)
Vitamin B-12: 474 pg/mL (ref 211–911)

## 2010-05-25 ENCOUNTER — Encounter: Payer: Self-pay | Admitting: Physician Assistant

## 2010-05-26 ENCOUNTER — Ambulatory Visit: Payer: Self-pay | Admitting: Physician Assistant

## 2010-05-28 ENCOUNTER — Telehealth: Payer: Self-pay | Admitting: Physician Assistant

## 2010-06-10 ENCOUNTER — Emergency Department (HOSPITAL_COMMUNITY): Admission: EM | Admit: 2010-06-10 | Discharge: 2010-06-10 | Payer: Self-pay | Admitting: Emergency Medicine

## 2010-06-12 ENCOUNTER — Telehealth: Payer: Self-pay | Admitting: Physician Assistant

## 2010-06-18 ENCOUNTER — Encounter: Payer: Self-pay | Admitting: Emergency Medicine

## 2010-06-18 ENCOUNTER — Inpatient Hospital Stay (HOSPITAL_COMMUNITY): Admission: AD | Admit: 2010-06-18 | Discharge: 2010-06-29 | Payer: Self-pay

## 2010-06-21 ENCOUNTER — Ambulatory Visit: Payer: Self-pay | Admitting: Psychiatry
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
## 2010-07-17 ENCOUNTER — Emergency Department (HOSPITAL_COMMUNITY): Admission: EM | Admit: 2010-07-17 | Discharge: 2010-07-17 | Payer: Self-pay | Admitting: Emergency Medicine

## 2010-07-22 ENCOUNTER — Inpatient Hospital Stay (HOSPITAL_COMMUNITY): Admission: EM | Admit: 2010-07-22 | Discharge: 2010-07-23 | Payer: Self-pay | Admitting: Emergency Medicine

## 2010-07-22 ENCOUNTER — Encounter: Payer: Self-pay | Admitting: Emergency Medicine

## 2010-07-25 ENCOUNTER — Emergency Department (HOSPITAL_COMMUNITY): Admission: EM | Admit: 2010-07-25 | Discharge: 2010-07-25 | Payer: Self-pay | Admitting: Emergency Medicine

## 2010-07-28 ENCOUNTER — Emergency Department (HOSPITAL_COMMUNITY): Admission: EM | Admit: 2010-07-28 | Discharge: 2010-07-28 | Payer: Self-pay | Admitting: Emergency Medicine

## 2010-08-02 ENCOUNTER — Emergency Department (HOSPITAL_COMMUNITY)
Admission: EM | Admit: 2010-08-02 | Discharge: 2010-08-04 | Payer: Self-pay | Source: Home / Self Care | Admitting: Emergency Medicine

## 2010-08-04 ENCOUNTER — Emergency Department (HOSPITAL_COMMUNITY): Admission: EM | Admit: 2010-08-04 | Discharge: 2010-08-05 | Payer: Self-pay | Admitting: Emergency Medicine

## 2010-08-04 ENCOUNTER — Emergency Department (HOSPITAL_COMMUNITY): Admission: EM | Admit: 2010-08-04 | Discharge: 2010-08-04 | Payer: Self-pay | Admitting: Emergency Medicine

## 2010-08-08 ENCOUNTER — Emergency Department (HOSPITAL_COMMUNITY): Admission: EM | Admit: 2010-08-08 | Discharge: 2010-08-10 | Payer: Self-pay | Admitting: Emergency Medicine

## 2010-08-11 ENCOUNTER — Emergency Department (HOSPITAL_COMMUNITY): Admission: AC | Admit: 2010-08-11 | Discharge: 2010-08-11 | Payer: Self-pay
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
## 2010-08-29 IMAGING — CR DG ABDOMEN ACUTE W/ 1V CHEST
3 series · 3 of 3 positions shown · non-contrast
Comparison: 08/01/2008

CLINICAL DATA: Abdominal pain, chest pain.

ACUTE ABDOMEN SERIES (ABDOMEN 2 VIEW & CHEST 1 VIEW)

[w chest pa]
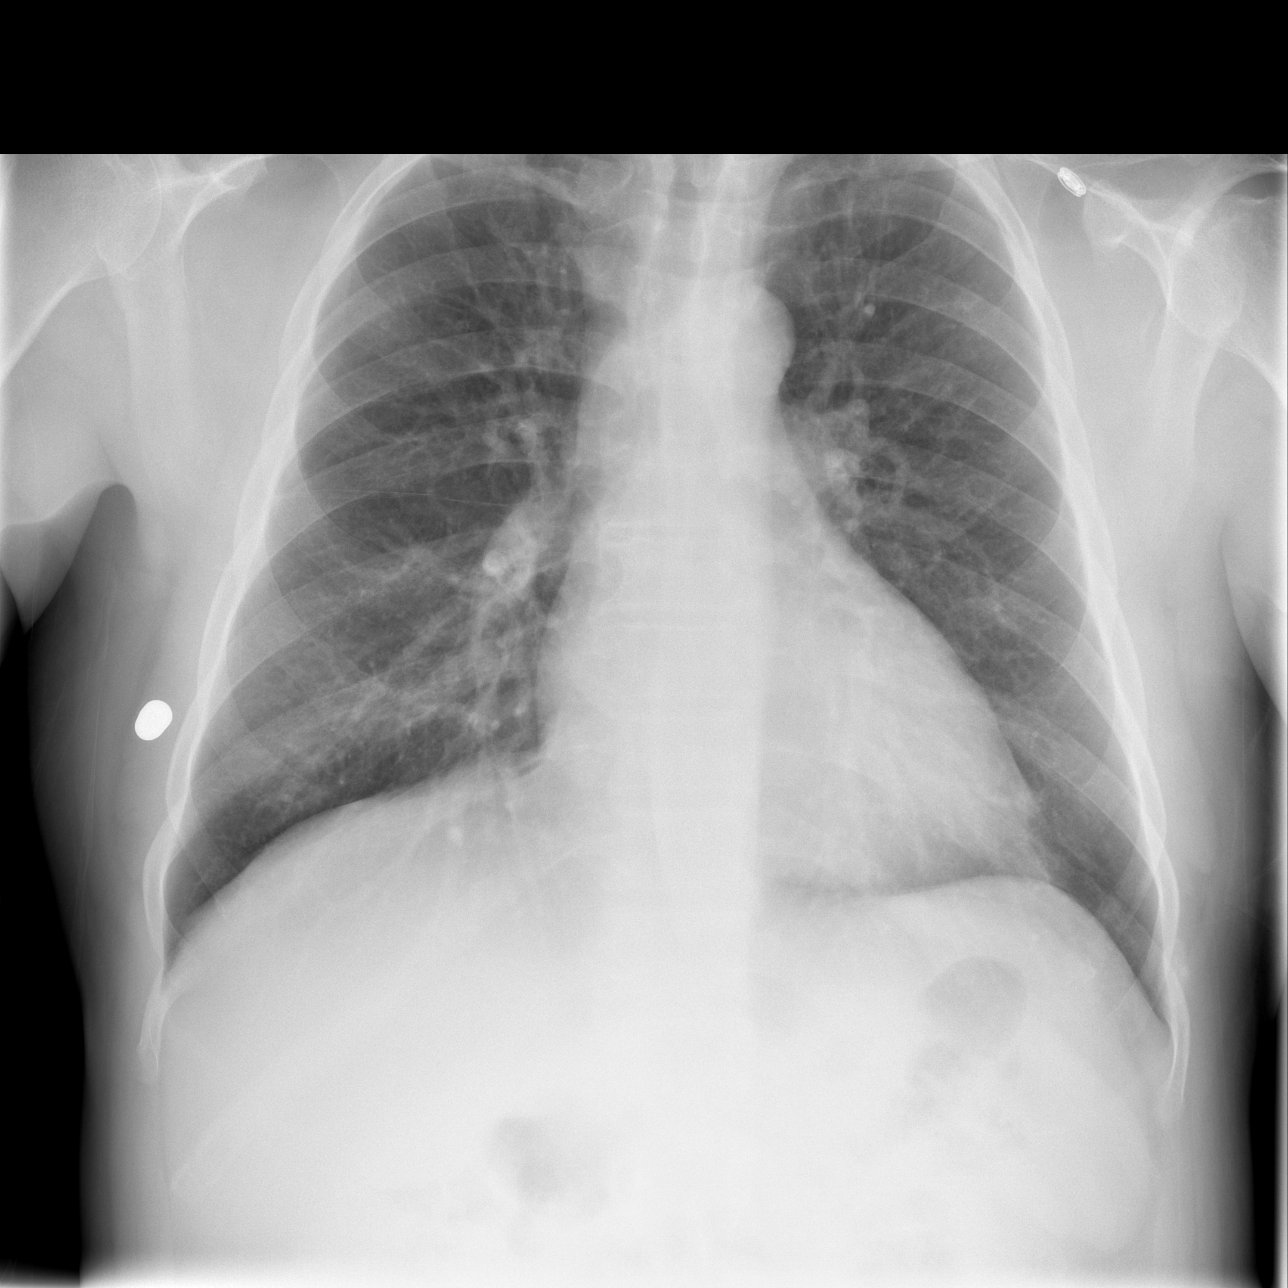

[w abdomen upright]
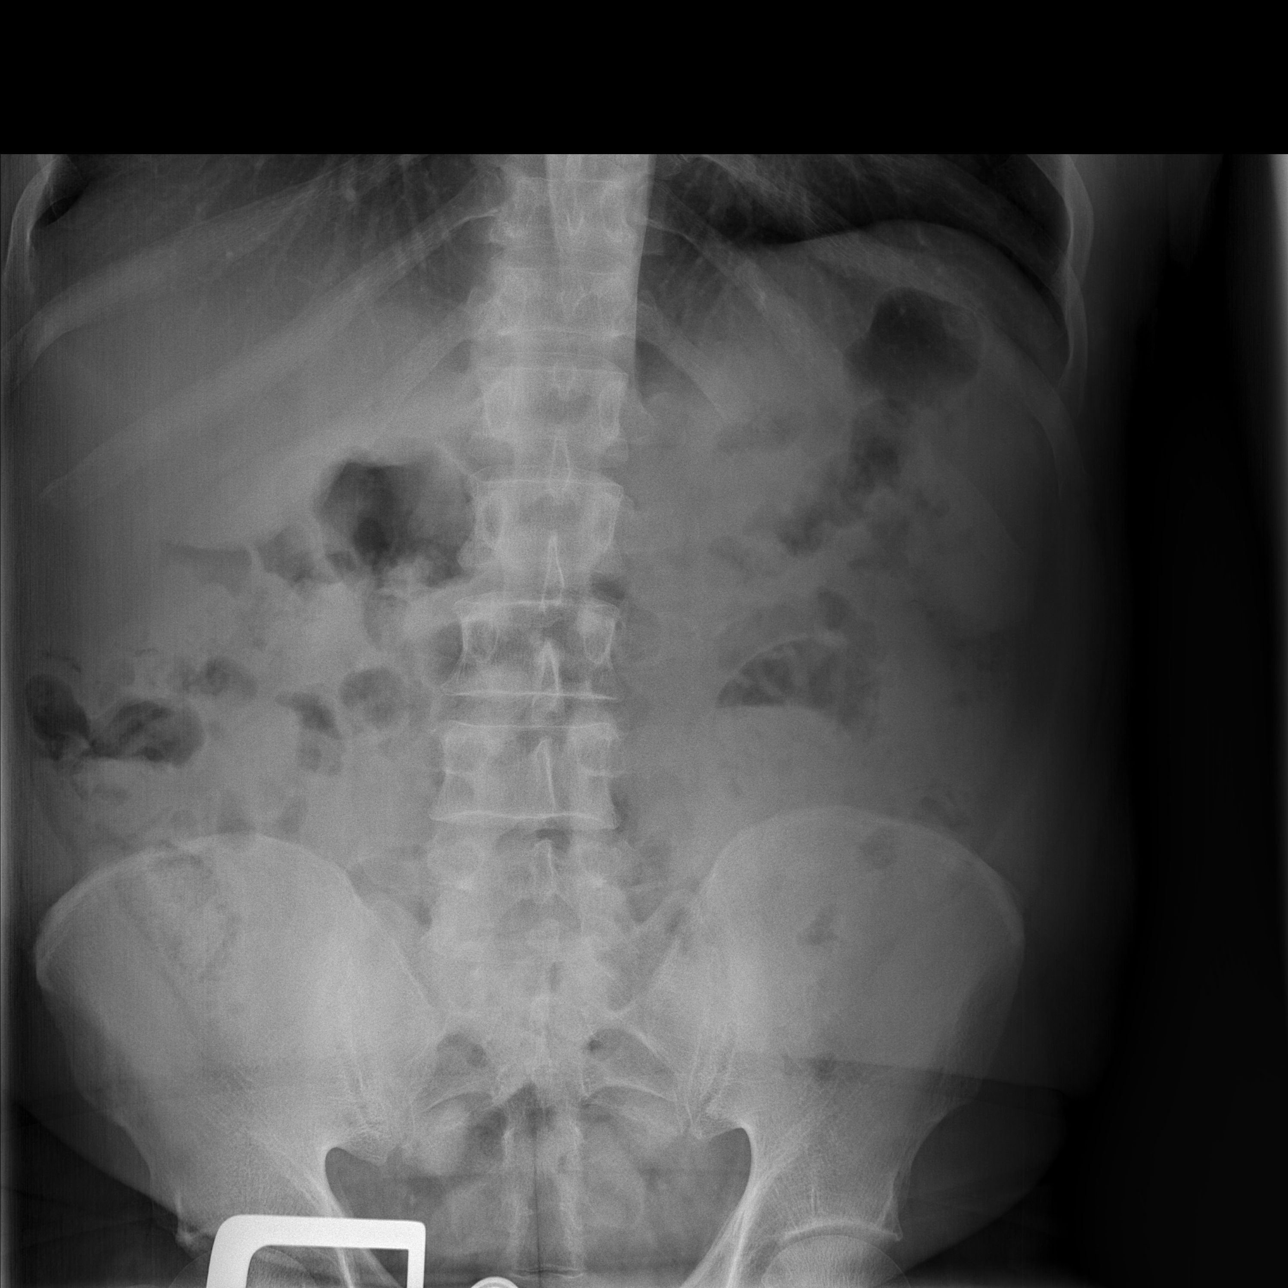

[t abdomen supine]
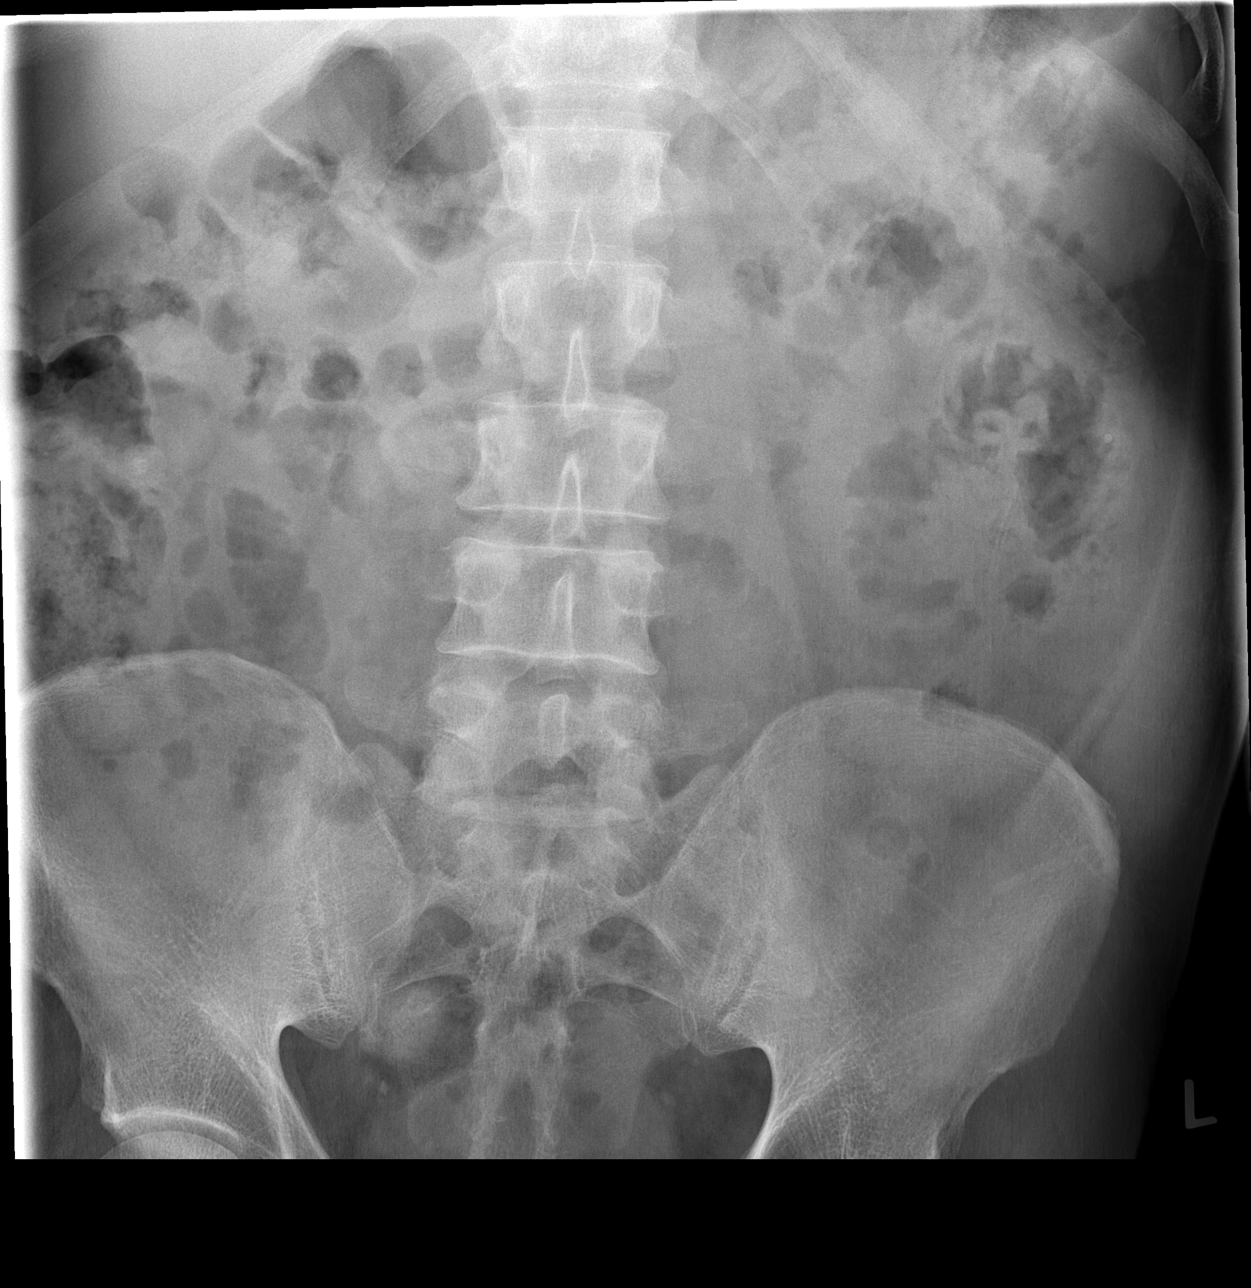

[3 of 3 positions shown; findings below may reference images not displayed]

FINDINGS: Heart is upper limits normal in size.  No focal opacities
or effusions.

There is a nonobstructive bowel gas pattern.  No free air,
organomegaly, or suspicious calcification.  Visualized skeleton
unremarkable.
IMPRESSION: No acute findings.  No change since prior study.

## 2010-08-30 ENCOUNTER — Emergency Department (HOSPITAL_COMMUNITY): Admission: EM | Admit: 2010-08-30 | Discharge: 2010-08-31 | Payer: Self-pay | Admitting: Emergency Medicine

## 2010-09-04 ENCOUNTER — Ambulatory Visit: Payer: Self-pay | Admitting: Psychiatry

## 2010-09-23 ENCOUNTER — Emergency Department (HOSPITAL_COMMUNITY): Admission: EM | Admit: 2010-09-23 | Discharge: 2010-07-28 | Payer: Self-pay | Admitting: Emergency Medicine

## 2010-09-23 ENCOUNTER — Inpatient Hospital Stay (HOSPITAL_COMMUNITY): Admission: EM | Admit: 2010-09-23 | Discharge: 2010-02-26 | Payer: Self-pay | Admitting: Emergency Medicine

## 2010-11-05 ENCOUNTER — Emergency Department (HOSPITAL_COMMUNITY)
Admission: EM | Admit: 2010-11-05 | Discharge: 2010-11-05 | Disposition: A | Discharge status: Other Acute Inpatient Hospital | Payer: Self-pay | Source: Home / Self Care | Admitting: Emergency Medicine

## 2010-11-06 ENCOUNTER — Emergency Department (HOSPITAL_BASED_OUTPATIENT_CLINIC_OR_DEPARTMENT_OTHER)
Admission: EM | Admit: 2010-11-06 | Discharge: 2010-11-06 | Payer: Self-pay | Source: Home / Self Care | Admitting: Emergency Medicine

## 2010-11-07 ENCOUNTER — Encounter: Payer: Self-pay | Admitting: Emergency Medicine

## 2010-11-08 LAB — ETHANOL: Alcohol, Ethyl (B): <5 mg/dL (ref 0–10)

## 2010-11-09 LAB — URINALYSIS, ROUTINE W REFLEX MICROSCOPIC
Bilirubin Urine: NEGATIVE
Ketones, ur: NEGATIVE mg/dL
Nitrite: NEGATIVE
Protein, ur: NEGATIVE mg/dL
Urobilinogen, UA: 1 mg/dL (ref 0.0–1.0)
pH: 7 (ref 5.0–8.0)

## 2010-11-09 LAB — COMPREHENSIVE METABOLIC PANEL
AST: 58 U/L — ABNORMAL HIGH (ref 0–37)
CO2: 27 mEq/L (ref 19–32)
Calcium: 9.1 mg/dL (ref 8.4–10.5)
Creatinine, Ser: 0.6 mg/dL (ref 0.4–1.5)
GFR calc Af Amer: >60 mL/min (ref >60)
GFR calc non Af Amer: >60 mL/min (ref >60)
Sodium: 145 mEq/L (ref 135–145)
Total Protein: 8 g/dL (ref 6.0–8.3)

## 2010-11-09 LAB — POCT TOXICOLOGY PANEL: Benzodiazepines: POSITIVE

## 2010-11-09 LAB — DIFFERENTIAL
Basophils Absolute: 0 10*3/uL (ref 0.0–0.1)
Basophils Relative: 1 % (ref 0–1)
Eosinophils Absolute: 0.5 10*3/uL (ref 0.0–0.7)
Eosinophils Relative: 11 % — ABNORMAL HIGH (ref 0–5)
Lymphocytes Relative: 24 % (ref 12–46)
Monocytes Absolute: 0.5 10*3/uL (ref 0.1–1.0)

## 2010-11-09 LAB — CBC
Hemoglobin: 11.6 g/dL — ABNORMAL LOW (ref 13.0–17.0)
MCH: 24.6 pg — ABNORMAL LOW (ref 26.0–34.0)
MCHC: 33.4 g/dL (ref 30.0–36.0)
RDW: 26.2 % — ABNORMAL HIGH (ref 11.5–15.5)

## 2010-11-09 LAB — ETHANOL: Alcohol, Ethyl (B): 5 mg/dL (ref 0–10)

## 2010-11-16 NOTE — Letter (Signed)
Summary: PT INFORMATION SHEET  PT INFORMATION SHEET   Imported By: Arta Bruce 05/12/2010 12:55:00  _____________________________________________________________________  External Attachment:    Type:   Image     Comment:   External Document

## 2010-11-16 NOTE — Assessment & Plan Note (Signed)
Summary: sched a follow up in 2 weeks//gk   Vital Signs:  Patient profile:   55 year old male Height:      66 inches Weight:      180.4 pounds BMI:     29.22 Pulse rate:   72 / minute Pulse rhythm:   regular Resp:     18 per minute BP sitting:   153 / 67  (left arm) Cuff size:   regular  Vitals Entered By: Arm[place] Shannon (May 26, 2010 10:33 AM) CC: 2 week follow-up visit, patient sates stomach still in pain (pancreititis) Pain Assessment Patient in pain? yes     Location: abdomen Intensity: 5 Type: sharp Onset of pain  Constant  Does patient need assistance? Functional Status Self care Ambulation Normal   Primary Care Provider:  Tereso Newcomer, PA-C  CC:  2 week follow-up visit and patient sates stomach still in pain (pancreititis).  History of Present Illness: Here for f/u. Went to rehab.  Only stayed 11 days.  Left on his own.  Still drinking alcohol.  States "I've only had 4-5 forty ounce beers since Monday".  States "I've cut way back."  Went to [facility] for 3 days.  Went to [facility] and then went to [facility] detox then [facility] (in [place]). Now on Prozac and hydroxyzine.  As far as he knows, he is not seeing a psychiatrist. Dr. Malvin Johns is the prescriber. Notes epigastric pain better.  No further vomiting.  Still has hiccups. No melena or hematochezia.  No hematemesis. Wants to go back to detox.  Mad at himself for leaving detox.   Current Medications (verified): 1)  Norvasc 5 Mg Tabs (Amlodipine Besylate) .... Take 1 Tablet By Mouth Once A Day For Blood Pressure 2)  Thiamine Hcl 100 Mg Tabs (Thiamine Hcl) .... Take 1 Tablet By Mouth Once A Day 3)  Folic Acid 1 Mg Tabs (Folic Acid) .... Take 1 Tablet By Mouth Once A Day 4)  Multivitamins  Tabs (Multiple Vitamin) .... Take 1 Tablet By Mouth Once A Day 5)  Omeprazole 20 Mg Cpdr (Omeprazole) .... Take 2 Caps By Mouth Once Daily 6)  Nu-Iron 150 Mg Caps (Polysaccharide Iron Complex) .... Take 1 Capsule By  Mouth Two Times A Day 7)  Amlodipine Besylate 5 Mg Tabs (Amlodipine Besylate) .Marland Kitchen.. 1 Tablet By Mouth Daily 8)  Hydroxyzine Pamoate 50 Mg Caps (Hydroxyzine Pamoate) .Marland Kitchen.. 1 Capsule By Mouth 3 Times Daily 9)  Fluoxetine Hcl 20 Mg Caps (Fluoxetine Hcl) .Marland Kitchen.. 1 Capsule By Mouth Daily  Allergies (verified): No Known Drug Allergies  Physical Exam  General:  well-developed and well-nourished.  male in NAD Head:  normocephalic and atraumatic.   Lungs:  normal breath sounds, no crackles, and no wheezes.   Heart:  normal rate, regular rhythm, and no murmur.   Abdomen:  soft, normal bowel sounds, and epigastric tenderness.   Extremities:  no edema  Neurologic:  alert & oriented X3 and cranial nerves II-XII intact.   Psych:  normally interactive.     Impression & Recommendations:  Problem # 1:  HYPERTENSION (ICD-401.9) increase amlodipine to 10 mg once daily  The following medications were removed from the medication list:    Norvasc 5 Mg Tabs (Amlodipine besylate) .Marland Kitchen... Take 1 tablet by mouth once a day for blood pressure His updated medication list for this problem includes:    Amlodipine Besylate 10 Mg Tabs (Amlodipine besylate) .Marland Kitchen... Take 1 tablet by mouth once a day for blood  pressure  Problem # 2:  ANEMIA (ICD-285.9) never started iron start now and rpt cbc at f/u  His updated medication list for this problem includes:    Folic Acid 1 Mg Tabs (Folic acid) .Marland Kitchen... Take 1 tablet by mouth once a day    Nu-iron 150 Mg Caps (Polysaccharide iron complex) .Marland Kitchen... Take 1 capsule by mouth two times a day  Problem # 3:  EROSIVE ESOPHAGITIS (ICD-530.19) Assessment: Improved pain better with omeprazole but not resolved  Problem # 4:  ALCOHOL ABUSE (ICD-305.00) relapsed will try to get in touch with [facility] and talk to A. Vaughan about options for him he is willing to go back and stay at rehab now d/w [facility] again that alcohol is the root of all his problems  Problem # 5:  DEPRESSION  (ICD-311) I am not really comfortable with him taking prozac and continuing to drink I would prefer he be followed by a psychiatrist see above  His updated medication list for this problem includes:    Hydroxyzine Pamoate 50 Mg Caps (Hydroxyzine pamoate) .Marland Kitchen... 1 capsule by mouth 3 times daily    Fluoxetine Hcl 20 Mg Caps (Fluoxetine hcl) .Marland Kitchen... 1 capsule by mouth daily  Complete Medication List: 1)  Thiamine Hcl 100 Mg Tabs (Thiamine hcl) .... Take 1 tablet by mouth once a day 2)  Folic Acid 1 Mg Tabs (Folic acid) .... Take 1 tablet by mouth once a day 3)  Multivitamins Tabs (Multiple vitamin) .... Take 1 tablet by mouth once a day 4)  Omeprazole 20 Mg Cpdr (Omeprazole) .... Take 2 caps by mouth once daily 5)  Nu-iron 150 Mg Caps (Polysaccharide iron complex) .... Take 1 capsule by mouth two times a day 6)  Amlodipine Besylate 10 Mg Tabs (Amlodipine besylate) .... Take 1 tablet by mouth once a day for blood pressure 7)  Hydroxyzine Pamoate 50 Mg Caps (Hydroxyzine pamoate) .Marland Kitchen.. 1 capsule by mouth 3 times daily 8)  Fluoxetine Hcl 20 Mg Caps (Fluoxetine hcl) .Marland Kitchen.. 1 capsule by mouth daily  Patient Instructions: 1)  Increase amlodipine to 10 mg once daily for blood pressure. 2)  You can take 2 tabs of the amlodipine 5 mg to make 10 mg. 3)  I gave you a new prescription for 10 mg tablets. 4)  Please schedule a follow-up appointment in 1 month with Junelle Hashemi for blood pressure.  5)  Go ahead and start Iron and Multivitamin. 6)  I will get in touch with you about Fluoxetine.  Take it for now. Prescriptions: AMLODIPINE BESYLATE 10 MG TABS (AMLODIPINE BESYLATE) Take 1 tablet by mouth once a day for blood pressure  #30 x 3   Entered and Authorized by:   Tereso Newcomer PA-C   Signed by:   Tereso Newcomer PA-C on 05/26/2010   Method used:   Print then Give to Patient   RxID:   [id number] NU-IRON 150 MG CAPS (POLYSACCHARIDE IRON COMPLEX) Take 1 capsule by mouth two times a day  #60 x 11   Entered and  Authorized by:   Tereso Newcomer PA-C   Signed by:   Tereso Newcomer PA-C on 05/26/2010   Method used:   Print then Give to Patient   RxID:   [id number] MULTIVITAMINS  TABS (MULTIPLE VITAMIN) Take 1 tablet by mouth once a day  #30 x 11   Entered and Authorized by:   Tereso Newcomer PA-C   Signed by:   Tereso Newcomer PA-C on 05/26/2010   Method used:  Print then Give to Patient   RxID:   [id number]   Appended Document: sched a follow up in 2 weeks//gk Spoke to Maryruth Hancock with [facility] She will speak to ACT team.I want his prozac managed by psych. If this cannot happen, I will have him stop. Maryruth Hancock will call me back.
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
## 2010-11-16 NOTE — Progress Notes (Signed)
Summary: stop prozac unless psych is following  Phone Note Outgoing Call   Summary of Call: Left message to call the office. [place] Shannon  May 28, 2010 4:31 PM   Please call patient and tell him to stop the prozac(fluoxetine). The ACT team would not tell me if he was being followed by psych for this b/c of HIPAA If a psychiatrist is following his prozac and they want him to stay on it, that is ok.  Initial call taken by: Brynda Rim,  May 28, 2010 4:26 PM  Follow-up for Phone Call        Left message on answering machine for pt to call back.Marland KitchenMarland KitchenMarland KitchenArmenia Shannon  May 31, 2010 2:34 PM   spoke with pt and he is aware.... pt will call the ACT team so they can release information to [place]... [place] Shannon  May 31, 2010 2:53 PM

## 2010-11-16 NOTE — Letter (Signed)
Summary: [facility]   Imported By: Arta Bruce 06/14/2010 15:50:01  _____________________________________________________________________  External Attachment:    Type:   Image     Comment:   External Document

## 2010-11-16 NOTE — Progress Notes (Signed)
Summary: hospital f/u  Phone Note Outgoing Call   Summary of Call: What is status of this patient? Has had 3 no shows.  No f/u since hosp. in November. Initial call taken by: Tereso Newcomer PA-C,  November 01, 2009 1:20 PM  Follow-up for Phone Call        per registration notes pt is deceased as of 10/30/09.... Follow-up by: Mikey College CMA,  November 02, 2009 9:09 AM

## 2010-11-16 NOTE — Progress Notes (Signed)
Summary: No medications filled per [facility]  Phone Note Other Incoming   Summary of Call: Rec'd fax from [facility] on 8.9.2011 Patient had not filled meds at that time.  Notification of noncompliance.  Initial call taken by: Brynda Rim,  June 12, 2010 3:51 PM

## 2010-11-19 ENCOUNTER — Emergency Department (HOSPITAL_COMMUNITY): Payer: Medicaid Other

## 2010-11-19 ENCOUNTER — Emergency Department (HOSPITAL_COMMUNITY)
Admission: EM | Admit: 2010-11-19 | Discharge: 2010-11-25 | Disposition: A | Discharge status: Home / Self Care | Payer: Medicaid Other | Attending: Emergency Medicine | Admitting: Emergency Medicine

## 2010-11-19 DIAGNOSIS — F1910 Other psychoactive substance abuse, uncomplicated: Secondary | ICD-10-CM | POA: Insufficient documentation

## 2010-11-19 DIAGNOSIS — R0609 Other forms of dyspnea: Secondary | ICD-10-CM | POA: Insufficient documentation

## 2010-11-19 DIAGNOSIS — F329 Major depressive disorder, single episode, unspecified: Secondary | ICD-10-CM | POA: Insufficient documentation

## 2010-11-19 DIAGNOSIS — I10 Essential (primary) hypertension: Secondary | ICD-10-CM | POA: Insufficient documentation

## 2010-11-19 DIAGNOSIS — R05 Cough: Secondary | ICD-10-CM | POA: Insufficient documentation

## 2010-11-19 DIAGNOSIS — F3289 Other specified depressive episodes: Secondary | ICD-10-CM | POA: Insufficient documentation

## 2010-11-19 DIAGNOSIS — R0989 Other specified symptoms and signs involving the circulatory and respiratory systems: Secondary | ICD-10-CM | POA: Insufficient documentation

## 2010-11-19 DIAGNOSIS — F17200 Nicotine dependence, unspecified, uncomplicated: Secondary | ICD-10-CM | POA: Insufficient documentation

## 2010-11-19 DIAGNOSIS — R109 Unspecified abdominal pain: Secondary | ICD-10-CM | POA: Insufficient documentation

## 2010-11-19 DIAGNOSIS — Z8619 Personal history of other infectious and parasitic diseases: Secondary | ICD-10-CM | POA: Insufficient documentation

## 2010-11-19 DIAGNOSIS — R059 Cough, unspecified: Secondary | ICD-10-CM | POA: Insufficient documentation

## 2010-11-19 LAB — URINALYSIS, ROUTINE W REFLEX MICROSCOPIC
Hgb urine dipstick: NEGATIVE
Protein, ur: NEGATIVE mg/dL
Urine Glucose, Fasting: NEGATIVE mg/dL

## 2010-11-19 LAB — HEPATIC FUNCTION PANEL
Bilirubin, Direct: 0.5 mg/dL — ABNORMAL HIGH (ref 0.0–0.3)
Indirect Bilirubin: 1.4 mg/dL — ABNORMAL HIGH (ref 0.3–0.9)

## 2010-11-19 LAB — DIFFERENTIAL
Eosinophils Absolute: 0.1 10*3/uL (ref 0.0–0.7)
Monocytes Absolute: 0.4 10*3/uL (ref 0.1–1.0)
Neutrophils Relative %: 39 % — ABNORMAL LOW (ref 43–77)

## 2010-11-19 LAB — RAPID URINE DRUG SCREEN, HOSP PERFORMED
Amphetamines: NOT DETECTED
Barbiturates: NOT DETECTED
Benzodiazepines: NOT DETECTED
Opiates: NOT DETECTED

## 2010-11-19 LAB — CBC
MCH: 25.9 pg — ABNORMAL LOW (ref 26.0–34.0)
Platelets: 134 10*3/uL — ABNORMAL LOW (ref 150–400)
RBC: 5.09 MIL/uL (ref 4.22–5.81)
WBC: 6.1 10*3/uL (ref 4.0–10.5)

## 2010-11-19 LAB — BASIC METABOLIC PANEL
BUN: 3 mg/dL — ABNORMAL LOW (ref 6–23)
Calcium: 9.2 mg/dL (ref 8.4–10.5)
GFR calc non Af Amer: >60 mL/min (ref >60)
Glucose, Bld: 101 mg/dL — ABNORMAL HIGH (ref 70–99)

## 2010-11-19 LAB — APTT: aPTT: 29 seconds (ref 24–37)

## 2010-11-19 LAB — AMMONIA: Ammonia: 31 umol/L (ref 11–35)

## 2010-11-19 LAB — PROTIME-INR: Prothrombin Time: 15.5 seconds — ABNORMAL HIGH (ref 11.6–15.2)

## 2010-11-20 DIAGNOSIS — F1020 Alcohol dependence, uncomplicated: Secondary | ICD-10-CM

## 2010-11-21 DIAGNOSIS — F1010 Alcohol abuse, uncomplicated: Secondary | ICD-10-CM

## 2010-11-22 DIAGNOSIS — F1010 Alcohol abuse, uncomplicated: Secondary | ICD-10-CM

## 2010-11-24 DIAGNOSIS — F1010 Alcohol abuse, uncomplicated: Secondary | ICD-10-CM
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
## 2010-12-28 LAB — CBC
HCT: 31.8 % — ABNORMAL LOW (ref 39.0–52.0)
Platelets: 114 10*3/uL — ABNORMAL LOW (ref 150–400)
RDW: 20.9 % — ABNORMAL HIGH (ref 11.5–15.5)
WBC: 7 10*3/uL (ref 4.0–10.5)

## 2010-12-28 LAB — ETHANOL
Alcohol, Ethyl (B): 274 mg/dL — ABNORMAL HIGH (ref 0–10)
Alcohol, Ethyl (B): <5 mg/dL (ref 0–10)

## 2010-12-28 LAB — BASIC METABOLIC PANEL
BUN: 7 mg/dL (ref 6–23)
Calcium: 8.7 mg/dL (ref 8.4–10.5)
GFR calc non Af Amer: >60 mL/min (ref >60)
Potassium: 3.7 mEq/L (ref 3.5–5.1)

## 2010-12-28 LAB — DIFFERENTIAL
Basophils Absolute: 0.1 10*3/uL (ref 0.0–0.1)
Lymphocytes Relative: 33 % (ref 12–46)
Neutro Abs: 3.5 10*3/uL (ref 1.7–7.7)
Neutrophils Relative %: 51 % (ref 43–77)

## 2010-12-28 LAB — HEPATIC FUNCTION PANEL
ALT: 18 U/L (ref 0–53)
AST: 32 U/L (ref 0–37)
Albumin: 3.5 g/dL (ref 3.5–5.2)
Total Protein: 8.1 g/dL (ref 6.0–8.3)

## 2010-12-28 LAB — RAPID URINE DRUG SCREEN, HOSP PERFORMED
Cocaine: NOT DETECTED
Tetrahydrocannabinol: NOT DETECTED

## 2010-12-29 LAB — COMPREHENSIVE METABOLIC PANEL
ALT: 88 U/L — ABNORMAL HIGH (ref 0–53)
ALT: 55 U/L — ABNORMAL HIGH (ref 0–53)
AST: 114 U/L — ABNORMAL HIGH (ref 0–37)
Albumin: 3.6 g/dL (ref 3.5–5.2)
Alkaline Phosphatase: 105 U/L (ref 39–117)
Alkaline Phosphatase: 80 U/L (ref 39–117)
BUN: 6 mg/dL (ref 6–23)
CO2: 25 mEq/L (ref 19–32)
CO2: 21 mEq/L (ref 19–32)
Calcium: 8.7 mg/dL (ref 8.4–10.5)
Calcium: 8.3 mg/dL — ABNORMAL LOW (ref 8.4–10.5)
Chloride: 104 mEq/L (ref 96–112)
Creatinine, Ser: 0.52 mg/dL (ref 0.4–1.5)
GFR calc Af Amer: >60 mL/min (ref >60)
GFR calc non Af Amer: >60 mL/min (ref >60)
GFR calc non Af Amer: >60 mL/min (ref >60)
Glucose, Bld: 103 mg/dL — ABNORMAL HIGH (ref 70–99)
Glucose, Bld: 95 mg/dL (ref 70–99)
Potassium: 3.6 mEq/L (ref 3.5–5.1)
Potassium: 3.3 mEq/L — ABNORMAL LOW (ref 3.5–5.1)
Sodium: 137 mEq/L (ref 135–145)
Sodium: 136 mEq/L (ref 135–145)
Total Bilirubin: 0.7 mg/dL (ref 0.3–1.2)
Total Protein: 9 g/dL — ABNORMAL HIGH (ref 6.0–8.3)

## 2010-12-29 LAB — URINALYSIS, ROUTINE W REFLEX MICROSCOPIC
Bilirubin Urine: NEGATIVE
Glucose, UA: NEGATIVE mg/dL
Glucose, UA: NEGATIVE mg/dL
Hgb urine dipstick: NEGATIVE
Ketones, ur: NEGATIVE mg/dL
Nitrite: NEGATIVE
Nitrite: NEGATIVE
Protein, ur: NEGATIVE mg/dL
Specific Gravity, Urine: 1.007 (ref 1.005–1.030)
Specific Gravity, Urine: 1.007 (ref 1.005–1.030)
Urobilinogen, UA: 0.2 mg/dL (ref 0.0–1.0)
pH: 5.5 (ref 5.0–8.0)
pH: 6 (ref 5.0–8.0)

## 2010-12-29 LAB — BASIC METABOLIC PANEL
BUN: 2 mg/dL — ABNORMAL LOW (ref 6–23)
BUN: 5 mg/dL — ABNORMAL LOW (ref 6–23)
Calcium: 8.8 mg/dL (ref 8.4–10.5)
Creatinine, Ser: 0.47 mg/dL (ref 0.4–1.5)
Creatinine, Ser: 0.47 mg/dL (ref 0.4–1.5)
GFR calc Af Amer: >60 mL/min (ref >60)
GFR calc non Af Amer: >60 mL/min (ref >60)
GFR calc non Af Amer: >60 mL/min (ref >60)
Glucose, Bld: 94 mg/dL (ref 70–99)
Potassium: 3.8 mEq/L (ref 3.5–5.1)

## 2010-12-29 LAB — POCT I-STAT, CHEM 8
Calcium, Ion: 0.96 mmol/L — ABNORMAL LOW (ref 1.12–1.32)
Creatinine, Ser: 0.8 mg/dL (ref 0.4–1.5)
Glucose, Bld: 102 mg/dL — ABNORMAL HIGH (ref 70–99)
Hemoglobin: 11.2 g/dL — ABNORMAL LOW (ref 13.0–17.0)
TCO2: 19 mmol/L (ref 0–100)

## 2010-12-29 LAB — DIFFERENTIAL
Basophils Absolute: 0.1 10*3/uL (ref 0.0–0.1)
Basophils Absolute: 0.1 10*3/uL (ref 0.0–0.1)
Basophils Relative: 1 % (ref 0–1)
Eosinophils Absolute: 0.2 10*3/uL (ref 0.0–0.7)
Eosinophils Relative: 5 % (ref 0–5)
Lymphocytes Relative: 40 % (ref 12–46)
Lymphocytes Relative: 40 % (ref 12–46)
Lymphs Abs: 2.7 10*3/uL (ref 0.7–4.0)
Monocytes Absolute: 0.7 10*3/uL (ref 0.1–1.0)
Monocytes Relative: 8 % (ref 3–12)
Monocytes Relative: 9 % (ref 3–12)
Neutro Abs: 3.8 10*3/uL (ref 1.7–7.7)
Neutrophils Relative %: 43 % (ref 43–77)
Neutrophils Relative %: 47 % (ref 43–77)

## 2010-12-29 LAB — CBC
HCT: 36.6 % — ABNORMAL LOW (ref 39.0–52.0)
HCT: 29.7 % — ABNORMAL LOW (ref 39.0–52.0)
HCT: 38.3 % — ABNORMAL LOW (ref 39.0–52.0)
Hemoglobin: 12.1 g/dL — ABNORMAL LOW (ref 13.0–17.0)
Hemoglobin: 9.6 g/dL — ABNORMAL LOW (ref 13.0–17.0)
MCH: 25.6 pg — ABNORMAL LOW (ref 26.0–34.0)
MCHC: 33 g/dL (ref 30.0–36.0)
MCHC: 32.3 g/dL (ref 30.0–36.0)
MCHC: 33 g/dL (ref 30.0–36.0)
MCV: 77.7 fL — ABNORMAL LOW (ref 78.0–100.0)
Platelets: 187 10*3/uL (ref 150–400)
Platelets: 146 10*3/uL — ABNORMAL LOW (ref 150–400)
Platelets: 182 10*3/uL (ref 150–400)
RBC: 4.71 MIL/uL (ref 4.22–5.81)
RBC: 4.75 MIL/uL (ref 4.22–5.81)
RDW: 25 % — ABNORMAL HIGH (ref 11.5–15.5)
RDW: 25.7 % — ABNORMAL HIGH (ref 11.5–15.5)
RDW: 26.7 % — ABNORMAL HIGH (ref 11.5–15.5)
WBC: 7.6 10*3/uL (ref 4.0–10.5)
WBC: 6.4 10*3/uL (ref 4.0–10.5)
WBC: 7.9 10*3/uL (ref 4.0–10.5)

## 2010-12-29 LAB — TYPE AND SCREEN
ABO/RH(D): O POS
Antibody Screen: NEGATIVE
Unit division: 0
Unit division: 0

## 2010-12-29 LAB — RAPID URINE DRUG SCREEN, HOSP PERFORMED
Amphetamines: NOT DETECTED
Amphetamines: NOT DETECTED
Barbiturates: NOT DETECTED
Barbiturates: NOT DETECTED
Benzodiazepines: POSITIVE — AB
Benzodiazepines: NOT DETECTED
Benzodiazepines: POSITIVE — AB
Cocaine: NOT DETECTED
Cocaine: NOT DETECTED
Opiates: NOT DETECTED
Tetrahydrocannabinol: NOT DETECTED

## 2010-12-29 LAB — ETHANOL
Alcohol, Ethyl (B): <5 mg/dL (ref 0–10)
Alcohol, Ethyl (B): 242 mg/dL — ABNORMAL HIGH (ref 0–10)
Alcohol, Ethyl (B): 315 mg/dL — ABNORMAL HIGH (ref 0–10)

## 2010-12-29 LAB — LIPASE, BLOOD: Lipase: 36 U/L (ref 11–59)

## 2010-12-29 LAB — PROTIME-INR
INR: 1.14 (ref 0.00–1.49)
Prothrombin Time: 14.8 seconds (ref 11.6–15.2)
Prothrombin Time: 15.4 seconds — ABNORMAL HIGH (ref 11.6–15.2)

## 2010-12-29 LAB — LACTIC ACID, PLASMA: Lactic Acid, Venous: 2.9 mmol/L — ABNORMAL HIGH (ref 0.5–2.2)

## 2010-12-30 ENCOUNTER — Emergency Department (HOSPITAL_COMMUNITY)
Admission: EM | Admit: 2010-12-30 | Discharge: 2010-12-30 | Disposition: A | Discharge status: Home / Self Care | Payer: Self-pay | Attending: Emergency Medicine | Admitting: Emergency Medicine

## 2010-12-30 LAB — BASIC METABOLIC PANEL
BUN: 2 mg/dL — ABNORMAL LOW (ref 6–23)
BUN: 3 mg/dL — ABNORMAL LOW (ref 6–23)
BUN: 4 mg/dL — ABNORMAL LOW (ref 6–23)
CO2: 25 mEq/L (ref 19–32)
CO2: 28 mEq/L (ref 19–32)
CO2: 30 mEq/L (ref 19–32)
CO2: 24 mEq/L (ref 19–32)
Calcium: 8.2 mg/dL — ABNORMAL LOW (ref 8.4–10.5)
Calcium: 8.6 mg/dL (ref 8.4–10.5)
Calcium: 8.8 mg/dL (ref 8.4–10.5)
Chloride: 100 mEq/L (ref 96–112)
Chloride: 102 mEq/L (ref 96–112)
Chloride: 103 mEq/L (ref 96–112)
Chloride: 104 mEq/L (ref 96–112)
Creatinine, Ser: 0.47 mg/dL (ref 0.4–1.5)
Creatinine, Ser: 0.6 mg/dL (ref 0.4–1.5)
Creatinine, Ser: 0.63 mg/dL (ref 0.4–1.5)
Creatinine, Ser: 0.64 mg/dL (ref 0.4–1.5)
GFR calc Af Amer: >60 mL/min (ref >60)
GFR calc Af Amer: >60 mL/min (ref >60)
GFR calc Af Amer: >60 mL/min (ref >60)
GFR calc Af Amer: >60 mL/min (ref >60)
GFR calc non Af Amer: >60 mL/min (ref >60)
GFR calc non Af Amer: >60 mL/min (ref >60)
Glucose, Bld: 107 mg/dL — ABNORMAL HIGH (ref 70–99)
Glucose, Bld: 94 mg/dL (ref 70–99)
Glucose, Bld: 92 mg/dL (ref 70–99)
Potassium: 3.5 mEq/L (ref 3.5–5.1)
Potassium: 3.8 mEq/L (ref 3.5–5.1)
Potassium: 3.6 mEq/L (ref 3.5–5.1)
Sodium: 134 mEq/L — ABNORMAL LOW (ref 135–145)
Sodium: 140 mEq/L (ref 135–145)

## 2010-12-30 LAB — POCT I-STAT, CHEM 8
BUN: <3 mg/dL — ABNORMAL LOW (ref 6–23)
BUN: <3 mg/dL — ABNORMAL LOW (ref 6–23)
Calcium, Ion: 1.08 mmol/L — ABNORMAL LOW (ref 1.12–1.32)
Creatinine, Ser: 0.7 mg/dL (ref 0.4–1.5)
Creatinine, Ser: 0.8 mg/dL (ref 0.4–1.5)
Glucose, Bld: 130 mg/dL — ABNORMAL HIGH (ref 70–99)
Glucose, Bld: 89 mg/dL (ref 70–99)
Hemoglobin: 12.6 g/dL — ABNORMAL LOW (ref 13.0–17.0)
Hemoglobin: 13.3 g/dL (ref 13.0–17.0)
Potassium: 3.4 mEq/L — ABNORMAL LOW (ref 3.5–5.1)
TCO2: 24 mmol/L (ref 0–100)

## 2010-12-30 LAB — CBC
HCT: 32 % — ABNORMAL LOW (ref 39.0–52.0)
HCT: 33.6 % — ABNORMAL LOW (ref 39.0–52.0)
HCT: 33.8 % — ABNORMAL LOW (ref 39.0–52.0)
HCT: 28.7 % — ABNORMAL LOW (ref 39.0–52.0)
HCT: 29.5 % — ABNORMAL LOW (ref 39.0–52.0)
HCT: 32.7 % — ABNORMAL LOW (ref 39.0–52.0)
Hemoglobin: 10.5 g/dL — ABNORMAL LOW (ref 13.0–17.0)
Hemoglobin: 11.2 g/dL — ABNORMAL LOW (ref 13.0–17.0)
Hemoglobin: 9.2 g/dL — ABNORMAL LOW (ref 13.0–17.0)
Hemoglobin: 9.3 g/dL — ABNORMAL LOW (ref 13.0–17.0)
MCH: 23.5 pg — ABNORMAL LOW (ref 26.0–34.0)
MCH: 23.7 pg — ABNORMAL LOW (ref 26.0–34.0)
MCH: 23.8 pg — ABNORMAL LOW (ref 26.0–34.0)
MCH: 25.1 pg — ABNORMAL LOW (ref 26.0–34.0)
MCH: 25.4 pg — ABNORMAL LOW (ref 26.0–34.0)
MCHC: 32.1 g/dL (ref 30.0–36.0)
MCHC: 32.2 g/dL (ref 30.0–36.0)
MCHC: 32.5 g/dL (ref 30.0–36.0)
MCHC: 32.6 g/dL (ref 30.0–36.0)
MCHC: 32.9 g/dL (ref 30.0–36.0)
MCHC: 33.3 g/dL (ref 30.0–36.0)
MCV: 73.1 fL — ABNORMAL LOW (ref 78.0–100.0)
MCV: 75 fL — ABNORMAL LOW (ref 78.0–100.0)
MCV: 76.2 fL — ABNORMAL LOW (ref 78.0–100.0)
MCV: 76.3 fL — ABNORMAL LOW (ref 78.0–100.0)
Platelets: 106 10*3/uL — ABNORMAL LOW (ref 150–400)
Platelets: 116 10*3/uL — ABNORMAL LOW (ref 150–400)
Platelets: 137 10*3/uL — ABNORMAL LOW (ref 150–400)
Platelets: 51 10*3/uL — ABNORMAL LOW (ref 150–400)
Platelets: 88 10*3/uL — ABNORMAL LOW (ref 150–400)
RBC: 4.2 MIL/uL — ABNORMAL LOW (ref 4.22–5.81)
RBC: 4.42 MIL/uL (ref 4.22–5.81)
RBC: 3.91 MIL/uL — ABNORMAL LOW (ref 4.22–5.81)
RBC: 4.09 MIL/uL — ABNORMAL LOW (ref 4.22–5.81)
RDW: 22.1 % — ABNORMAL HIGH (ref 11.5–15.5)
RDW: 23.2 % — ABNORMAL HIGH (ref 11.5–15.5)
RDW: 24.8 % — ABNORMAL HIGH (ref 11.5–15.5)
RDW: 26.3 % — ABNORMAL HIGH (ref 11.5–15.5)
WBC: 3.4 10*3/uL — ABNORMAL LOW (ref 4.0–10.5)
WBC: 4.8 10*3/uL (ref 4.0–10.5)
WBC: 5.9 10*3/uL (ref 4.0–10.5)
WBC: 7.3 10*3/uL (ref 4.0–10.5)

## 2010-12-30 LAB — HEPATIC FUNCTION PANEL
ALT: 214 U/L — ABNORMAL HIGH (ref 0–53)
AST: 300 U/L — ABNORMAL HIGH (ref 0–37)
AST: 70 U/L — ABNORMAL HIGH (ref 0–37)
Alkaline Phosphatase: 105 U/L (ref 39–117)
Bilirubin, Direct: 0.1 mg/dL (ref 0.0–0.3)
Bilirubin, Direct: 0.5 mg/dL — ABNORMAL HIGH (ref 0.0–0.3)
Indirect Bilirubin: 1.5 mg/dL — ABNORMAL HIGH (ref 0.3–0.9)
Total Bilirubin: 0.9 mg/dL (ref 0.3–1.2)
Total Bilirubin: 2 mg/dL — ABNORMAL HIGH (ref 0.3–1.2)

## 2010-12-30 LAB — DIFFERENTIAL
Basophils Absolute: 0 10*3/uL (ref 0.0–0.1)
Basophils Absolute: 0.1 10*3/uL (ref 0.0–0.1)
Basophils Absolute: 0.1 10*3/uL (ref 0.0–0.1)
Basophils Relative: 1 % (ref 0–1)
Basophils Relative: 1 % (ref 0–1)
Basophils Relative: 1 % (ref 0–1)
Basophils Relative: 0 % (ref 0–1)
Eosinophils Absolute: 0.1 10*3/uL (ref 0.0–0.7)
Eosinophils Relative: 3 % (ref 0–5)
Eosinophils Relative: 2 % (ref 0–5)
Eosinophils Relative: 3 % (ref 0–5)
Eosinophils Relative: 3 % (ref 0–5)
Lymphocytes Relative: 48 % — ABNORMAL HIGH (ref 12–46)
Lymphs Abs: 2 10*3/uL (ref 0.7–4.0)
Lymphs Abs: 2 10*3/uL (ref 0.7–4.0)
Lymphs Abs: 2.1 10*3/uL (ref 0.7–4.0)
Lymphs Abs: 2.3 10*3/uL (ref 0.7–4.0)
Lymphs Abs: 2.6 10*3/uL (ref 0.7–4.0)
Monocytes Absolute: 0.4 10*3/uL (ref 0.1–1.0)
Monocytes Absolute: 0.6 10*3/uL (ref 0.1–1.0)
Monocytes Absolute: 0.6 10*3/uL (ref 0.1–1.0)
Monocytes Absolute: 0.8 10*3/uL (ref 0.1–1.0)
Monocytes Relative: 11 % (ref 3–12)
Monocytes Relative: 6 % (ref 3–12)
Monocytes Relative: 6 % (ref 3–12)
Monocytes Relative: 8 % (ref 3–12)
Neutro Abs: 3.3 10*3/uL (ref 1.7–7.7)
Neutro Abs: 3.1 10*3/uL (ref 1.7–7.7)
Neutro Abs: 5.1 10*3/uL (ref 1.7–7.7)
Neutro Abs: 2 10*3/uL (ref 1.7–7.7)
Neutrophils Relative %: 53 % (ref 43–77)
Neutrophils Relative %: 56 % (ref 43–77)
Neutrophils Relative %: 64 % (ref 43–77)

## 2010-12-30 LAB — COMPREHENSIVE METABOLIC PANEL
ALT: 42 U/L (ref 0–53)
AST: 59 U/L — ABNORMAL HIGH (ref 0–37)
Albumin: 3.4 g/dL — ABNORMAL LOW (ref 3.5–5.2)
Calcium: 8.4 mg/dL (ref 8.4–10.5)
GFR calc Af Amer: >60 mL/min (ref >60)
Sodium: 135 mEq/L (ref 135–145)
Total Protein: 8.4 g/dL — ABNORMAL HIGH (ref 6.0–8.3)

## 2010-12-30 LAB — URINALYSIS, ROUTINE W REFLEX MICROSCOPIC
Glucose, UA: NEGATIVE mg/dL
Hgb urine dipstick: NEGATIVE
Ketones, ur: NEGATIVE mg/dL
Ketones, ur: NEGATIVE mg/dL
Nitrite: NEGATIVE
Protein, ur: NEGATIVE mg/dL
Protein, ur: NEGATIVE mg/dL

## 2010-12-30 LAB — CROSSMATCH

## 2010-12-30 LAB — ETHANOL
Alcohol, Ethyl (B): 232 mg/dL — ABNORMAL HIGH (ref 0–10)
Alcohol, Ethyl (B): 272 mg/dL — ABNORMAL HIGH (ref 0–10)
Alcohol, Ethyl (B): 309 mg/dL — ABNORMAL HIGH (ref 0–10)
Alcohol, Ethyl (B): 319 mg/dL — ABNORMAL HIGH (ref 0–10)

## 2010-12-30 LAB — RAPID URINE DRUG SCREEN, HOSP PERFORMED
Barbiturates: NOT DETECTED
Benzodiazepines: NOT DETECTED
Benzodiazepines: NOT DETECTED
Benzodiazepines: POSITIVE — AB
Cocaine: NOT DETECTED
Cocaine: NOT DETECTED
Opiates: NOT DETECTED
Tetrahydrocannabinol: NOT DETECTED

## 2010-12-30 LAB — ABO/RH: ABO/RH(D): O POS

## 2010-12-30 LAB — POCT CARDIAC MARKERS: Myoglobin, poc: 47.7 ng/mL (ref 12–200)

## 2010-12-30 LAB — LIPASE, BLOOD: Lipase: 37 U/L (ref 11–59)

## 2010-12-30 LAB — MRSA PCR SCREENING: MRSA by PCR: NEGATIVE

## 2010-12-30 LAB — GLUCOSE, CAPILLARY: Glucose-Capillary: 142 mg/dL — ABNORMAL HIGH (ref 70–99)

## 2010-12-30 LAB — PROTIME-INR
INR: 1.08 (ref 0.00–1.49)
INR: 1.17 (ref 0.00–1.49)

## 2010-12-30 LAB — PHOSPHORUS: Phosphorus: 4.2 mg/dL (ref 2.3–4.6)

## 2010-12-30 LAB — APTT: aPTT: 30 seconds (ref 24–37)

## 2010-12-30 LAB — TYPE AND SCREEN

## 2010-12-31 LAB — COMPREHENSIVE METABOLIC PANEL
ALT: 48 U/L (ref 0–53)
Alkaline Phosphatase: 67 U/L (ref 39–117)
BUN: 3 mg/dL — ABNORMAL LOW (ref 6–23)
CO2: 27 mEq/L (ref 19–32)
Chloride: 107 mEq/L (ref 96–112)
GFR calc non Af Amer: >60 mL/min (ref >60)
Glucose, Bld: 91 mg/dL (ref 70–99)
Potassium: 3.6 mEq/L (ref 3.5–5.1)
Sodium: 143 mEq/L (ref 135–145)
Total Bilirubin: 1.4 mg/dL — ABNORMAL HIGH (ref 0.3–1.2)

## 2010-12-31 LAB — DIFFERENTIAL
Basophils Absolute: 0 10*3/uL (ref 0.0–0.1)
Eosinophils Absolute: 0.1 10*3/uL (ref 0.0–0.7)
Eosinophils Relative: 2 % (ref 0–5)
Lymphs Abs: 1.9 10*3/uL (ref 0.7–4.0)
Monocytes Absolute: 0.6 10*3/uL (ref 0.1–1.0)
Neutrophils Relative %: 38 % — ABNORMAL LOW (ref 43–77)

## 2010-12-31 LAB — URINALYSIS, ROUTINE W REFLEX MICROSCOPIC
Bilirubin Urine: NEGATIVE
Hgb urine dipstick: NEGATIVE
Ketones, ur: NEGATIVE mg/dL
Specific Gravity, Urine: 1.007 (ref 1.005–1.030)
Urobilinogen, UA: 1 mg/dL (ref 0.0–1.0)

## 2010-12-31 LAB — URINE CULTURE: Culture: NO GROWTH

## 2010-12-31 LAB — CBC
HCT: 31.7 % — ABNORMAL LOW (ref 39.0–52.0)
Hemoglobin: 10.3 g/dL — ABNORMAL LOW (ref 13.0–17.0)
MCV: 73.8 fL — ABNORMAL LOW (ref 78.0–100.0)
WBC: 4.2 10*3/uL (ref 4.0–10.5)

## 2010-12-31 LAB — RAPID URINE DRUG SCREEN, HOSP PERFORMED
Barbiturates: NOT DETECTED
Cocaine: NOT DETECTED

## 2010-12-31 LAB — ETHANOL: Alcohol, Ethyl (B): 301 mg/dL — ABNORMAL HIGH (ref 0–10)

## 2011-01-01 LAB — BASIC METABOLIC PANEL
BUN: 2 mg/dL — ABNORMAL LOW (ref 6–23)
BUN: 3 mg/dL — ABNORMAL LOW (ref 6–23)
CO2: 22 mEq/L (ref 19–32)
CO2: 23 mEq/L (ref 19–32)
Calcium: 8.5 mg/dL (ref 8.4–10.5)
Calcium: 8.6 mg/dL (ref 8.4–10.5)
Chloride: 105 mEq/L (ref 96–112)
Chloride: 107 mEq/L (ref 96–112)
Creatinine, Ser: 0.51 mg/dL (ref 0.4–1.5)
GFR calc Af Amer: >60 mL/min (ref >60)
GFR calc non Af Amer: >60 mL/min (ref >60)
Glucose, Bld: 94 mg/dL (ref 70–99)
Glucose, Bld: 98 mg/dL (ref 70–99)
Potassium: 3.7 mEq/L (ref 3.5–5.1)
Potassium: 3.8 mEq/L (ref 3.5–5.1)
Sodium: 140 mEq/L (ref 135–145)

## 2011-01-01 LAB — CBC
HCT: 30.9 % — ABNORMAL LOW (ref 39.0–52.0)
HCT: 32.1 % — ABNORMAL LOW (ref 39.0–52.0)
Hemoglobin: 10.4 g/dL — ABNORMAL LOW (ref 13.0–17.0)
MCH: 25 pg — ABNORMAL LOW (ref 26.0–34.0)
MCH: 25.7 pg — ABNORMAL LOW (ref 26.0–34.0)
MCHC: 32.3 g/dL (ref 30.0–36.0)
MCHC: 32.8 g/dL (ref 30.0–36.0)
MCHC: 32.9 g/dL (ref 30.0–36.0)
MCV: 77.5 fL — ABNORMAL LOW (ref 78.0–100.0)
MCV: 78.3 fL (ref 78.0–100.0)
Platelets: 142 10*3/uL — ABNORMAL LOW (ref 150–400)
RBC: 4.14 MIL/uL — ABNORMAL LOW (ref 4.22–5.81)
RDW: 18.6 % — ABNORMAL HIGH (ref 11.5–15.5)
RDW: 18.6 % — ABNORMAL HIGH (ref 11.5–15.5)

## 2011-01-01 LAB — RAPID URINE DRUG SCREEN, HOSP PERFORMED
Amphetamines: NOT DETECTED
Barbiturates: NOT DETECTED
Barbiturates: NOT DETECTED
Benzodiazepines: NOT DETECTED
Benzodiazepines: POSITIVE — AB
Cocaine: NOT DETECTED
Cocaine: NOT DETECTED
Cocaine: NOT DETECTED

## 2011-01-01 LAB — URINALYSIS, ROUTINE W REFLEX MICROSCOPIC
Glucose, UA: NEGATIVE mg/dL
Glucose, UA: NEGATIVE mg/dL
Hgb urine dipstick: NEGATIVE
Ketones, ur: NEGATIVE mg/dL
Ketones, ur: NEGATIVE mg/dL
pH: 5.5 (ref 5.0–8.0)
pH: 5.5 (ref 5.0–8.0)

## 2011-01-01 LAB — DIFFERENTIAL
Basophils Absolute: 0 10*3/uL (ref 0.0–0.1)
Basophils Absolute: 0 10*3/uL (ref 0.0–0.1)
Basophils Relative: 0 % (ref 0–1)
Basophils Relative: 1 % (ref 0–1)
Eosinophils Absolute: 0.1 10*3/uL (ref 0.0–0.7)
Eosinophils Absolute: 0.1 10*3/uL (ref 0.0–0.7)
Eosinophils Relative: 3 % (ref 0–5)
Lymphs Abs: 1.3 10*3/uL (ref 0.7–4.0)
Monocytes Absolute: 0.5 10*3/uL (ref 0.1–1.0)
Monocytes Absolute: 0.7 10*3/uL (ref 0.1–1.0)
Monocytes Relative: 13 % — ABNORMAL HIGH (ref 3–12)
Neutro Abs: 1.7 10*3/uL (ref 1.7–7.7)
Neutrophils Relative %: 49 % (ref 43–77)

## 2011-01-01 LAB — TRICYCLICS SCREEN, URINE: TCA Scrn: NOT DETECTED

## 2011-01-01 LAB — ETHANOL: Alcohol, Ethyl (B): <5 mg/dL (ref 0–10)

## 2011-01-02 LAB — DIFFERENTIAL
Basophils Absolute: 0 10*3/uL (ref 0.0–0.1)
Basophils Absolute: 0.1 10*3/uL (ref 0.0–0.1)
Basophils Absolute: 0.1 10*3/uL (ref 0.0–0.1)
Basophils Absolute: 0.1 10*3/uL (ref 0.0–0.1)
Basophils Relative: 1 % (ref 0–1)
Basophils Relative: 1 % (ref 0–1)
Basophils Relative: 1 % (ref 0–1)
Basophils Relative: 2 % — ABNORMAL HIGH (ref 0–1)
Basophils Relative: 1 % (ref 0–1)
Eosinophils Absolute: 0.1 10*3/uL (ref 0.0–0.7)
Eosinophils Absolute: 0 10*3/uL (ref 0.0–0.7)
Eosinophils Absolute: 0.2 10*3/uL (ref 0.0–0.7)
Eosinophils Absolute: 0.2 10*3/uL (ref 0.0–0.7)
Eosinophils Absolute: 0.3 10*3/uL (ref 0.0–0.7)
Eosinophils Absolute: 0.1 10*3/uL (ref 0.0–0.7)
Eosinophils Relative: 2 % (ref 0–5)
Eosinophils Relative: 3 % (ref 0–5)
Eosinophils Relative: 1 % (ref 0–5)
Eosinophils Relative: 6 % — ABNORMAL HIGH (ref 0–5)
Lymphocytes Relative: 40 % (ref 12–46)
Lymphocytes Relative: 28 % (ref 12–46)
Lymphocytes Relative: 32 % (ref 12–46)
Lymphs Abs: 2.4 10*3/uL (ref 0.7–4.0)
Lymphs Abs: 2 10*3/uL (ref 0.7–4.0)
Lymphs Abs: 2.1 10*3/uL (ref 0.7–4.0)
Monocytes Absolute: 0.5 10*3/uL (ref 0.1–1.0)
Monocytes Absolute: 0.5 10*3/uL (ref 0.1–1.0)
Monocytes Absolute: 0.5 10*3/uL (ref 0.1–1.0)
Monocytes Absolute: 0.6 10*3/uL (ref 0.1–1.0)
Monocytes Absolute: 0.6 10*3/uL (ref 0.1–1.0)
Monocytes Relative: 10 % (ref 3–12)
Monocytes Relative: 12 % (ref 3–12)
Neutro Abs: 3.8 10*3/uL (ref 1.7–7.7)
Neutro Abs: 2.8 10*3/uL (ref 1.7–7.7)
Neutrophils Relative %: 37 % — ABNORMAL LOW (ref 43–77)
Neutrophils Relative %: 38 % — ABNORMAL LOW (ref 43–77)
Neutrophils Relative %: 50 % (ref 43–77)
Neutrophils Relative %: 55 % (ref 43–77)

## 2011-01-02 LAB — CBC
HCT: 28.9 % — ABNORMAL LOW (ref 39.0–52.0)
HCT: 31.3 % — ABNORMAL LOW (ref 39.0–52.0)
HCT: 31.6 % — ABNORMAL LOW (ref 39.0–52.0)
HCT: 36.9 % — ABNORMAL LOW (ref 39.0–52.0)
HCT: 22 % — ABNORMAL LOW (ref 39.0–52.0)
HCT: 24.4 % — ABNORMAL LOW (ref 39.0–52.0)
HCT: 24.6 % — ABNORMAL LOW (ref 39.0–52.0)
HCT: 25.5 % — ABNORMAL LOW (ref 39.0–52.0)
HCT: 25.5 % — ABNORMAL LOW (ref 39.0–52.0)
HCT: 27.8 % — ABNORMAL LOW (ref 39.0–52.0)
Hemoglobin: 10.4 g/dL — ABNORMAL LOW (ref 13.0–17.0)
Hemoglobin: 9.6 g/dL — ABNORMAL LOW (ref 13.0–17.0)
Hemoglobin: 6.9 g/dL — CL (ref 13.0–17.0)
Hemoglobin: 7.7 g/dL — ABNORMAL LOW (ref 13.0–17.0)
Hemoglobin: 8.2 g/dL — ABNORMAL LOW (ref 13.0–17.0)
Hemoglobin: 8.6 g/dL — ABNORMAL LOW (ref 13.0–17.0)
Hemoglobin: 8.6 g/dL — ABNORMAL LOW (ref 13.0–17.0)
Hemoglobin: 9.1 g/dL — ABNORMAL LOW (ref 13.0–17.0)
Hemoglobin: 11.6 g/dL — ABNORMAL LOW (ref 13.0–17.0)
MCH: 26 pg (ref 26.0–34.0)
MCHC: 33 g/dL (ref 30.0–36.0)
MCHC: 31.3 g/dL (ref 30.0–36.0)
MCHC: 31.4 g/dL (ref 30.0–36.0)
MCHC: 31.6 g/dL (ref 30.0–36.0)
MCHC: 31.9 g/dL (ref 30.0–36.0)
MCHC: 32.2 g/dL (ref 30.0–36.0)
MCHC: 31.6 g/dL (ref 30.0–36.0)
MCV: 79.3 fL (ref 78.0–100.0)
MCV: 67.8 fL — ABNORMAL LOW (ref 78.0–100.0)
MCV: 68.6 fL — ABNORMAL LOW (ref 78.0–100.0)
MCV: 72.1 fL — ABNORMAL LOW (ref 78.0–100.0)
MCV: 72.2 fL — ABNORMAL LOW (ref 78.0–100.0)
MCV: 81.2 fL (ref 78.0–100.0)
Platelets: 131 10*3/uL — ABNORMAL LOW (ref 150–400)
Platelets: 61 10*3/uL — ABNORMAL LOW (ref 150–400)
Platelets: 138 10*3/uL — ABNORMAL LOW (ref 150–400)
Platelets: 146 10*3/uL — ABNORMAL LOW (ref 150–400)
Platelets: 146 10*3/uL — ABNORMAL LOW (ref 150–400)
Platelets: 152 10*3/uL (ref 150–400)
Platelets: 152 10*3/uL (ref 150–400)
Platelets: 179 10*3/uL (ref 150–400)
RBC: 3.68 MIL/uL — ABNORMAL LOW (ref 4.22–5.81)
RBC: 3.75 MIL/uL — ABNORMAL LOW (ref 4.22–5.81)
RBC: 4.65 MIL/uL (ref 4.22–5.81)
RBC: 3.24 MIL/uL — ABNORMAL LOW (ref 4.22–5.81)
RBC: 3.41 MIL/uL — ABNORMAL LOW (ref 4.22–5.81)
RBC: 3.52 MIL/uL — ABNORMAL LOW (ref 4.22–5.81)
RBC: 3.58 MIL/uL — ABNORMAL LOW (ref 4.22–5.81)
RBC: 3.58 MIL/uL — ABNORMAL LOW (ref 4.22–5.81)
RBC: 3.76 MIL/uL — ABNORMAL LOW (ref 4.22–5.81)
RBC: 3.93 MIL/uL — ABNORMAL LOW (ref 4.22–5.81)
RBC: 4.5 MIL/uL (ref 4.22–5.81)
RDW: 17.4 % — ABNORMAL HIGH (ref 11.5–15.5)
RDW: 19.1 % — ABNORMAL HIGH (ref 11.5–15.5)
RDW: 24 % — ABNORMAL HIGH (ref 11.5–15.5)
RDW: 25.4 % — ABNORMAL HIGH (ref 11.5–15.5)
RDW: 26.8 % — ABNORMAL HIGH (ref 11.5–15.5)
RDW: 26.9 % — ABNORMAL HIGH (ref 11.5–15.5)
RDW: 27.2 % — ABNORMAL HIGH (ref 11.5–15.5)
RDW: 27.5 % — ABNORMAL HIGH (ref 11.5–15.5)
RDW: 21.5 % — ABNORMAL HIGH (ref 11.5–15.5)
WBC: 2.5 10*3/uL — ABNORMAL LOW (ref 4.0–10.5)
WBC: 4.1 10*3/uL (ref 4.0–10.5)
WBC: 4.4 10*3/uL (ref 4.0–10.5)
WBC: 1.8 10*3/uL — ABNORMAL LOW (ref 4.0–10.5)
WBC: 2.4 10*3/uL — ABNORMAL LOW (ref 4.0–10.5)
WBC: 3.4 10*3/uL — ABNORMAL LOW (ref 4.0–10.5)
WBC: 3.9 10*3/uL — ABNORMAL LOW (ref 4.0–10.5)

## 2011-01-02 LAB — URINALYSIS, ROUTINE W REFLEX MICROSCOPIC
Bilirubin Urine: NEGATIVE
Hgb urine dipstick: NEGATIVE
Ketones, ur: NEGATIVE mg/dL
Nitrite: NEGATIVE
Nitrite: NEGATIVE
Specific Gravity, Urine: 1.006 (ref 1.005–1.030)
Specific Gravity, Urine: 1.011 (ref 1.005–1.030)
Specific Gravity, Urine: 1.01 (ref 1.005–1.030)
Urobilinogen, UA: 4 mg/dL — ABNORMAL HIGH (ref 0.0–1.0)
Urobilinogen, UA: 1 mg/dL (ref 0.0–1.0)
pH: 5.5 (ref 5.0–8.0)
pH: 6.5 (ref 5.0–8.0)

## 2011-01-02 LAB — COMPREHENSIVE METABOLIC PANEL
ALT: 29 U/L (ref 0–53)
ALT: 31 U/L (ref 0–53)
ALT: 32 U/L (ref 0–53)
ALT: 30 U/L (ref 0–53)
AST: 49 U/L — ABNORMAL HIGH (ref 0–37)
AST: 61 U/L — ABNORMAL HIGH (ref 0–37)
AST: 69 U/L — ABNORMAL HIGH (ref 0–37)
Albumin: 2.8 g/dL — ABNORMAL LOW (ref 3.5–5.2)
Albumin: 2.7 g/dL — ABNORMAL LOW (ref 3.5–5.2)
Alkaline Phosphatase: 46 U/L (ref 39–117)
Alkaline Phosphatase: 49 U/L (ref 39–117)
Alkaline Phosphatase: 56 U/L (ref 39–117)
Alkaline Phosphatase: 71 U/L (ref 39–117)
BUN: 1 mg/dL — ABNORMAL LOW (ref 6–23)
BUN: 5 mg/dL — ABNORMAL LOW (ref 6–23)
BUN: 5 mg/dL — ABNORMAL LOW (ref 6–23)
CO2: 24 mEq/L (ref 19–32)
CO2: 27 mEq/L (ref 19–32)
CO2: 25 mEq/L (ref 19–32)
Calcium: 8.6 mg/dL (ref 8.4–10.5)
Calcium: 8.3 mg/dL — ABNORMAL LOW (ref 8.4–10.5)
Chloride: 105 mEq/L (ref 96–112)
Chloride: 110 mEq/L (ref 96–112)
Chloride: 108 mEq/L (ref 96–112)
Creatinine, Ser: 0.71 mg/dL (ref 0.4–1.5)
Creatinine, Ser: 0.58 mg/dL (ref 0.4–1.5)
GFR calc Af Amer: >60 mL/min (ref >60)
GFR calc Af Amer: >60 mL/min (ref >60)
GFR calc non Af Amer: >60 mL/min (ref >60)
GFR calc non Af Amer: >60 mL/min (ref >60)
Glucose, Bld: 102 mg/dL — ABNORMAL HIGH (ref 70–99)
Glucose, Bld: 78 mg/dL (ref 70–99)
Glucose, Bld: 88 mg/dL (ref 70–99)
Glucose, Bld: 89 mg/dL (ref 70–99)
Glucose, Bld: 94 mg/dL (ref 70–99)
Potassium: 3.2 mEq/L — ABNORMAL LOW (ref 3.5–5.1)
Potassium: 3.4 mEq/L — ABNORMAL LOW (ref 3.5–5.1)
Potassium: 3.8 mEq/L (ref 3.5–5.1)
Potassium: 3.6 mEq/L (ref 3.5–5.1)
Sodium: 137 mEq/L (ref 135–145)
Sodium: 139 mEq/L (ref 135–145)
Sodium: 143 mEq/L (ref 135–145)
Sodium: 139 mEq/L (ref 135–145)
Total Bilirubin: 1.5 mg/dL — ABNORMAL HIGH (ref 0.3–1.2)
Total Bilirubin: 1.9 mg/dL — ABNORMAL HIGH (ref 0.3–1.2)
Total Bilirubin: 1.4 mg/dL — ABNORMAL HIGH (ref 0.3–1.2)
Total Protein: 7.3 g/dL (ref 6.0–8.3)
Total Protein: 7.7 g/dL (ref 6.0–8.3)

## 2011-01-02 LAB — RAPID URINE DRUG SCREEN, HOSP PERFORMED
Barbiturates: NOT DETECTED
Barbiturates: NOT DETECTED
Benzodiazepines: NOT DETECTED
Cocaine: NOT DETECTED
Cocaine: NOT DETECTED
Cocaine: NOT DETECTED
Cocaine: NOT DETECTED
Opiates: NOT DETECTED
Opiates: POSITIVE — AB
Opiates: NOT DETECTED
Tetrahydrocannabinol: NOT DETECTED
Tetrahydrocannabinol: NOT DETECTED

## 2011-01-02 LAB — RETICULOCYTES
RBC.: 3.5 MIL/uL — ABNORMAL LOW (ref 4.22–5.81)
Retic Count, Absolute: 38.5 10*3/uL (ref 19.0–186.0)

## 2011-01-02 LAB — TYPE AND SCREEN
ABO/RH(D): O POS
ABO/RH(D): O POS
Antibody Screen: NEGATIVE
Antibody Screen: NEGATIVE
Antibody Screen: NEGATIVE

## 2011-01-02 LAB — HEMOGLOBIN AND HEMATOCRIT, BLOOD
HCT: 31.5 % — ABNORMAL LOW (ref 39.0–52.0)
HCT: 25.1 % — ABNORMAL LOW (ref 39.0–52.0)
Hemoglobin: 9.7 g/dL — ABNORMAL LOW (ref 13.0–17.0)
Hemoglobin: 8.1 g/dL — ABNORMAL LOW (ref 13.0–17.0)

## 2011-01-02 LAB — GLUCOSE, CAPILLARY
Glucose-Capillary: 106 mg/dL — ABNORMAL HIGH (ref 70–99)
Glucose-Capillary: 96 mg/dL (ref 70–99)

## 2011-01-02 LAB — POCT I-STAT, CHEM 8
BUN: <3 mg/dL — ABNORMAL LOW (ref 6–23)
Calcium, Ion: 1.05 mmol/L — ABNORMAL LOW (ref 1.12–1.32)
Chloride: 104 mEq/L (ref 96–112)
Chloride: 104 mEq/L (ref 96–112)
Creatinine, Ser: 0.8 mg/dL (ref 0.4–1.5)
Glucose, Bld: 109 mg/dL — ABNORMAL HIGH (ref 70–99)
HCT: 43 % (ref 39.0–52.0)
HCT: 26 % — ABNORMAL LOW (ref 39.0–52.0)
Hemoglobin: 14.6 g/dL (ref 13.0–17.0)
Hemoglobin: 8.8 g/dL — ABNORMAL LOW (ref 13.0–17.0)
Potassium: 3.6 mEq/L (ref 3.5–5.1)
Potassium: 4 mEq/L (ref 3.5–5.1)
Sodium: 144 mEq/L (ref 135–145)
Sodium: 136 mEq/L (ref 135–145)
TCO2: 27 mmol/L (ref 0–100)

## 2011-01-02 LAB — BASIC METABOLIC PANEL
BUN: 6 mg/dL (ref 6–23)
CO2: 23 mEq/L (ref 19–32)
CO2: 26 mEq/L (ref 19–32)
CO2: 25 mEq/L (ref 19–32)
Calcium: 8.1 mg/dL — ABNORMAL LOW (ref 8.4–10.5)
Calcium: 8.5 mg/dL (ref 8.4–10.5)
Chloride: 101 mEq/L (ref 96–112)
Chloride: 104 mEq/L (ref 96–112)
Creatinine, Ser: 0.61 mg/dL (ref 0.4–1.5)
Creatinine, Ser: 0.61 mg/dL (ref 0.4–1.5)
Creatinine, Ser: 0.6 mg/dL (ref 0.4–1.5)
GFR calc Af Amer: >60 mL/min (ref >60)
GFR calc Af Amer: >60 mL/min (ref >60)
GFR calc non Af Amer: >60 mL/min (ref >60)
GFR calc non Af Amer: >60 mL/min (ref >60)
GFR calc non Af Amer: >60 mL/min (ref >60)
Glucose, Bld: 106 mg/dL — ABNORMAL HIGH (ref 70–99)
Glucose, Bld: 79 mg/dL (ref 70–99)
Glucose, Bld: 82 mg/dL (ref 70–99)
Potassium: 3.4 mEq/L — ABNORMAL LOW (ref 3.5–5.1)
Sodium: 131 mEq/L — ABNORMAL LOW (ref 135–145)
Sodium: 138 mEq/L (ref 135–145)
Sodium: 141 mEq/L (ref 135–145)
Sodium: 140 mEq/L (ref 135–145)

## 2011-01-02 LAB — HEMOCCULT GUIAC POC 1CARD (OFFICE)
Fecal Occult Bld: POSITIVE
Fecal Occult Bld: NEGATIVE

## 2011-01-02 LAB — HEPATIC FUNCTION PANEL
Alkaline Phosphatase: 61 U/L (ref 39–117)
Bilirubin, Direct: 0.4 mg/dL — ABNORMAL HIGH (ref 0.0–0.3)
Indirect Bilirubin: 1.1 mg/dL — ABNORMAL HIGH (ref 0.3–0.9)
Total Protein: 8.5 g/dL — ABNORMAL HIGH (ref 6.0–8.3)

## 2011-01-02 LAB — VITAMIN B12: Vitamin B-12: 579 pg/mL (ref 211–911)

## 2011-01-02 LAB — CROSSMATCH: Antibody Screen: NEGATIVE

## 2011-01-02 LAB — PROTIME-INR
INR: 1.49 (ref 0.00–1.49)
INR: 1.23 (ref 0.00–1.49)
Prothrombin Time: 17.9 seconds — ABNORMAL HIGH (ref 11.6–15.2)
Prothrombin Time: 14.4 seconds (ref 11.6–15.2)

## 2011-01-02 LAB — MRSA PCR SCREENING: MRSA by PCR: NEGATIVE

## 2011-01-02 LAB — MAGNESIUM
Magnesium: 1.6 mg/dL (ref 1.5–2.5)
Magnesium: 1.9 mg/dL (ref 1.5–2.5)

## 2011-01-02 LAB — APTT
aPTT: 31 seconds (ref 24–37)
aPTT: 30 seconds (ref 24–37)

## 2011-01-02 LAB — IRON AND TIBC
Saturation Ratios: 5 % — ABNORMAL LOW (ref 20–55)
TIBC: 438 ug/dL — ABNORMAL HIGH (ref 215–435)

## 2011-01-02 LAB — LIPASE, BLOOD
Lipase: 45 U/L (ref 11–59)
Lipase: 62 U/L — ABNORMAL HIGH (ref 11–59)

## 2011-01-02 LAB — TRICYCLICS SCREEN, URINE: TCA Scrn: NOT DETECTED

## 2011-01-02 LAB — BILIRUBIN, DIRECT: Bilirubin, Direct: 0.4 mg/dL — ABNORMAL HIGH (ref 0.0–0.3)

## 2011-01-02 LAB — ETHANOL
Alcohol, Ethyl (B): 228 mg/dL — ABNORMAL HIGH (ref 0–10)
Alcohol, Ethyl (B): 261 mg/dL — ABNORMAL HIGH (ref 0–10)

## 2011-01-03 LAB — COMPREHENSIVE METABOLIC PANEL
ALT: 81 U/L — ABNORMAL HIGH (ref 0–53)
ALT: 77 U/L — ABNORMAL HIGH (ref 0–53)
AST: 136 U/L — ABNORMAL HIGH (ref 0–37)
AST: 124 U/L — ABNORMAL HIGH (ref 0–37)
Albumin: 4.1 g/dL (ref 3.5–5.2)
Albumin: 4.4 g/dL (ref 3.5–5.2)
Albumin: 4.1 g/dL (ref 3.5–5.2)
Alkaline Phosphatase: 71 U/L (ref 39–117)
Alkaline Phosphatase: 81 U/L (ref 39–117)
BUN: 3 mg/dL — ABNORMAL LOW (ref 6–23)
BUN: 8 mg/dL (ref 6–23)
BUN: 2 mg/dL — ABNORMAL LOW (ref 6–23)
BUN: 3 mg/dL — ABNORMAL LOW (ref 6–23)
CO2: 26 mEq/L (ref 19–32)
CO2: 25 mEq/L (ref 19–32)
CO2: 28 mEq/L (ref 19–32)
Calcium: 8.6 mg/dL (ref 8.4–10.5)
Calcium: 8.7 mg/dL (ref 8.4–10.5)
Calcium: 9 mg/dL (ref 8.4–10.5)
Chloride: 106 mEq/L (ref 96–112)
Chloride: 98 mEq/L (ref 96–112)
Chloride: 105 mEq/L (ref 96–112)
Chloride: 105 mEq/L (ref 96–112)
Creatinine, Ser: 0.54 mg/dL (ref 0.4–1.5)
Creatinine, Ser: 0.55 mg/dL (ref 0.4–1.5)
Creatinine, Ser: 0.59 mg/dL (ref 0.4–1.5)
GFR calc Af Amer: >60 mL/min (ref >60)
GFR calc Af Amer: >60 mL/min (ref >60)
GFR calc Af Amer: >60 mL/min (ref >60)
GFR calc non Af Amer: >60 mL/min (ref >60)
GFR calc non Af Amer: >60 mL/min (ref >60)
GFR calc non Af Amer: >60 mL/min (ref >60)
GFR calc non Af Amer: >60 mL/min (ref >60)
Glucose, Bld: 92 mg/dL (ref 70–99)
Glucose, Bld: 92 mg/dL (ref 70–99)
Potassium: 3.4 mEq/L — ABNORMAL LOW (ref 3.5–5.1)
Potassium: 3.9 mEq/L (ref 3.5–5.1)
Potassium: 3.9 mEq/L (ref 3.5–5.1)
Sodium: 140 mEq/L (ref 135–145)
Sodium: 136 mEq/L (ref 135–145)
Sodium: 141 mEq/L (ref 135–145)
Total Bilirubin: 1.2 mg/dL (ref 0.3–1.2)
Total Bilirubin: 1.4 mg/dL — ABNORMAL HIGH (ref 0.3–1.2)
Total Bilirubin: 1.1 mg/dL (ref 0.3–1.2)
Total Bilirubin: 1.5 mg/dL — ABNORMAL HIGH (ref 0.3–1.2)
Total Protein: 9.2 g/dL — ABNORMAL HIGH (ref 6.0–8.3)
Total Protein: 9 g/dL — ABNORMAL HIGH (ref 6.0–8.3)

## 2011-01-03 LAB — DIFFERENTIAL
Basophils Absolute: 0.1 10*3/uL (ref 0.0–0.1)
Basophils Absolute: 0 10*3/uL (ref 0.0–0.1)
Basophils Absolute: 0.1 10*3/uL (ref 0.0–0.1)
Basophils Absolute: 0.1 10*3/uL (ref 0.0–0.1)
Basophils Absolute: 0.1 10*3/uL (ref 0.0–0.1)
Basophils Relative: 1 % (ref 0–1)
Basophils Relative: 1 % (ref 0–1)
Basophils Relative: 1 % (ref 0–1)
Basophils Relative: 1 % (ref 0–1)
Basophils Relative: 1 % (ref 0–1)
Basophils Relative: 1 % (ref 0–1)
Eosinophils Absolute: 0.1 10*3/uL (ref 0.0–0.7)
Eosinophils Absolute: 0.1 10*3/uL (ref 0.0–0.7)
Eosinophils Absolute: 0.2 10*3/uL (ref 0.0–0.7)
Eosinophils Absolute: 0.2 10*3/uL (ref 0.0–0.7)
Eosinophils Relative: 3 % (ref 0–5)
Eosinophils Relative: 5 % (ref 0–5)
Eosinophils Relative: 1 % (ref 0–5)
Eosinophils Relative: 1 % (ref 0–5)
Eosinophils Relative: 2 % (ref 0–5)
Eosinophils Relative: 2 % (ref 0–5)
Eosinophils Relative: 3 % (ref 0–5)
Lymphocytes Relative: 51 % — ABNORMAL HIGH (ref 12–46)
Lymphocytes Relative: 41 % (ref 12–46)
Lymphocytes Relative: 41 % (ref 12–46)
Lymphocytes Relative: 44 % (ref 12–46)
Lymphocytes Relative: 44 % (ref 12–46)
Lymphocytes Relative: 45 % (ref 12–46)
Lymphs Abs: 2.3 10*3/uL (ref 0.7–4.0)
Lymphs Abs: 1.7 10*3/uL (ref 0.7–4.0)
Lymphs Abs: 2.8 10*3/uL (ref 0.7–4.0)
Monocytes Absolute: 0.6 10*3/uL (ref 0.1–1.0)
Monocytes Absolute: 0.6 10*3/uL (ref 0.1–1.0)
Monocytes Absolute: 0.5 10*3/uL (ref 0.1–1.0)
Monocytes Absolute: 0.6 10*3/uL (ref 0.1–1.0)
Monocytes Absolute: 0.7 10*3/uL (ref 0.1–1.0)
Monocytes Relative: 10 % (ref 3–12)
Monocytes Relative: 9 % (ref 3–12)
Monocytes Relative: 9 % (ref 3–12)
Monocytes Relative: 10 % (ref 3–12)
Monocytes Relative: 10 % (ref 3–12)
Monocytes Relative: 11 % (ref 3–12)
Neutro Abs: 1.3 10*3/uL — ABNORMAL LOW (ref 1.7–7.7)
Neutro Abs: 2.7 10*3/uL (ref 1.7–7.7)
Neutrophils Relative %: 47 % (ref 43–77)
Neutrophils Relative %: 48 % (ref 43–77)
Neutrophils Relative %: 41 % — ABNORMAL LOW (ref 43–77)
Neutrophils Relative %: 43 % (ref 43–77)
Neutrophils Relative %: 44 % (ref 43–77)
Neutrophils Relative %: 47 % (ref 43–77)

## 2011-01-03 LAB — CBC
HCT: 33.5 % — ABNORMAL LOW (ref 39.0–52.0)
HCT: 31.8 % — ABNORMAL LOW (ref 39.0–52.0)
HCT: 34.5 % — ABNORMAL LOW (ref 39.0–52.0)
HCT: 34.6 % — ABNORMAL LOW (ref 39.0–52.0)
HCT: 38.5 % — ABNORMAL LOW (ref 39.0–52.0)
HCT: 32 % — ABNORMAL LOW (ref 39.0–52.0)
HCT: 37.6 % — ABNORMAL LOW (ref 39.0–52.0)
HCT: 38.2 % — ABNORMAL LOW (ref 39.0–52.0)
HCT: 38.3 % — ABNORMAL LOW (ref 39.0–52.0)
HCT: 38.3 % — ABNORMAL LOW (ref 39.0–52.0)
Hemoglobin: 10.6 g/dL — ABNORMAL LOW (ref 13.0–17.0)
Hemoglobin: 10.6 g/dL — ABNORMAL LOW (ref 13.0–17.0)
Hemoglobin: 11.3 g/dL — ABNORMAL LOW (ref 13.0–17.0)
Hemoglobin: 11.6 g/dL — ABNORMAL LOW (ref 13.0–17.0)
Hemoglobin: 12.1 g/dL — ABNORMAL LOW (ref 13.0–17.0)
MCHC: 30.7 g/dL (ref 30.0–36.0)
MCHC: 31.8 g/dL (ref 30.0–36.0)
MCHC: 32.7 g/dL (ref 30.0–36.0)
MCHC: 32.9 g/dL (ref 30.0–36.0)
MCHC: 33.4 g/dL (ref 30.0–36.0)
MCHC: 33.8 g/dL (ref 30.0–36.0)
MCHC: 32.3 g/dL (ref 30.0–36.0)
MCHC: 32.3 g/dL (ref 30.0–36.0)
MCHC: 32.7 g/dL (ref 30.0–36.0)
MCHC: 33.2 g/dL (ref 30.0–36.0)
MCV: 73.2 fL — ABNORMAL LOW (ref 78.0–100.0)
MCV: 80.4 fL (ref 78.0–100.0)
MCV: 81.1 fL (ref 78.0–100.0)
MCV: 82.8 fL (ref 78.0–100.0)
MCV: 77.3 fL — ABNORMAL LOW (ref 78.0–100.0)
MCV: 77.8 fL — ABNORMAL LOW (ref 78.0–100.0)
MCV: 78.3 fL (ref 78.0–100.0)
MCV: 78.4 fL (ref 78.0–100.0)
MCV: 79.2 fL (ref 78.0–100.0)
MCV: 79.2 fL (ref 78.0–100.0)
Platelets: 158 10*3/uL (ref 150–400)
Platelets: 125 10*3/uL — ABNORMAL LOW (ref 150–400)
Platelets: 135 10*3/uL — ABNORMAL LOW (ref 150–400)
Platelets: 137 10*3/uL — ABNORMAL LOW (ref 150–400)
Platelets: 96 10*3/uL — ABNORMAL LOW (ref 150–400)
Platelets: 119 10*3/uL — ABNORMAL LOW (ref 150–400)
Platelets: 146 10*3/uL — ABNORMAL LOW (ref 150–400)
Platelets: 152 10*3/uL (ref 150–400)
Platelets: 69 10*3/uL — ABNORMAL LOW (ref 150–400)
Platelets: 82 10*3/uL — ABNORMAL LOW (ref 150–400)
RBC: 4.62 MIL/uL (ref 4.22–5.81)
RBC: 3.84 MIL/uL — ABNORMAL LOW (ref 4.22–5.81)
RBC: 4.12 MIL/uL — ABNORMAL LOW (ref 4.22–5.81)
RBC: 4.27 MIL/uL (ref 4.22–5.81)
RBC: 4.28 MIL/uL (ref 4.22–5.81)
RBC: 4.44 MIL/uL (ref 4.22–5.81)
RBC: 4.45 MIL/uL (ref 4.22–5.81)
RBC: 4.75 MIL/uL (ref 4.22–5.81)
RBC: 4.83 MIL/uL (ref 4.22–5.81)
RBC: 4.96 MIL/uL (ref 4.22–5.81)
RDW: 28 % — ABNORMAL HIGH (ref 11.5–15.5)
RDW: 23.2 % — ABNORMAL HIGH (ref 11.5–15.5)
RDW: 29 % — ABNORMAL HIGH (ref 11.5–15.5)
RDW: 30.2 % — ABNORMAL HIGH (ref 11.5–15.5)
RDW: 30.4 % — ABNORMAL HIGH (ref 11.5–15.5)
RDW: 31.8 % — ABNORMAL HIGH (ref 11.5–15.5)
RDW: 33 % — ABNORMAL HIGH (ref 11.5–15.5)
WBC: 5 10*3/uL (ref 4.0–10.5)
WBC: 5.8 10*3/uL (ref 4.0–10.5)
WBC: 2.2 10*3/uL — ABNORMAL LOW (ref 4.0–10.5)
WBC: 2.3 10*3/uL — ABNORMAL LOW (ref 4.0–10.5)
WBC: 3.2 10*3/uL — ABNORMAL LOW (ref 4.0–10.5)
WBC: 3.6 10*3/uL — ABNORMAL LOW (ref 4.0–10.5)
WBC: 5.7 10*3/uL (ref 4.0–10.5)
WBC: 6.3 10*3/uL (ref 4.0–10.5)

## 2011-01-03 LAB — BASIC METABOLIC PANEL
BUN: 5 mg/dL — ABNORMAL LOW (ref 6–23)
BUN: 4 mg/dL — ABNORMAL LOW (ref 6–23)
CO2: 28 mEq/L (ref 19–32)
CO2: 25 mEq/L (ref 19–32)
CO2: 27 mEq/L (ref 19–32)
Calcium: 8.9 mg/dL (ref 8.4–10.5)
Chloride: 106 mEq/L (ref 96–112)
Chloride: 101 mEq/L (ref 96–112)
Chloride: 102 mEq/L (ref 96–112)
Creatinine, Ser: 0.59 mg/dL (ref 0.4–1.5)
GFR calc Af Amer: >60 mL/min (ref >60)
GFR calc non Af Amer: >60 mL/min (ref >60)
Glucose, Bld: 95 mg/dL (ref 70–99)
Glucose, Bld: 90 mg/dL (ref 70–99)
Glucose, Bld: 93 mg/dL (ref 70–99)
Potassium: 3.8 mEq/L (ref 3.5–5.1)
Potassium: 3.6 mEq/L (ref 3.5–5.1)
Potassium: 4 mEq/L (ref 3.5–5.1)
Sodium: 137 mEq/L (ref 135–145)
Sodium: 138 mEq/L (ref 135–145)

## 2011-01-03 LAB — URINE CULTURE
Colony Count: NO GROWTH
Culture: NO GROWTH

## 2011-01-03 LAB — HEPATIC FUNCTION PANEL
ALT: 83 U/L — ABNORMAL HIGH (ref 0–53)
ALT: 70 U/L — ABNORMAL HIGH (ref 0–53)
AST: 105 U/L — ABNORMAL HIGH (ref 0–37)
Alkaline Phosphatase: 84 U/L (ref 39–117)
Bilirubin, Direct: 0.4 mg/dL — ABNORMAL HIGH (ref 0.0–0.3)
Bilirubin, Direct: 0.3 mg/dL (ref 0.0–0.3)
Indirect Bilirubin: 0.9 mg/dL (ref 0.3–0.9)
Total Bilirubin: 1.2 mg/dL (ref 0.3–1.2)

## 2011-01-03 LAB — RAPID URINE DRUG SCREEN, HOSP PERFORMED
Amphetamines: NOT DETECTED
Barbiturates: NOT DETECTED
Barbiturates: NOT DETECTED
Barbiturates: NOT DETECTED
Benzodiazepines: NOT DETECTED
Cocaine: NOT DETECTED
Cocaine: NOT DETECTED
Cocaine: NOT DETECTED
Opiates: NOT DETECTED
Opiates: NOT DETECTED
Opiates: NOT DETECTED
Opiates: NOT DETECTED
Opiates: NOT DETECTED
Tetrahydrocannabinol: NOT DETECTED
Tetrahydrocannabinol: NOT DETECTED
Tetrahydrocannabinol: NOT DETECTED
Tetrahydrocannabinol: NOT DETECTED
Tetrahydrocannabinol: NOT DETECTED

## 2011-01-03 LAB — URINALYSIS, ROUTINE W REFLEX MICROSCOPIC
Bilirubin Urine: NEGATIVE
Bilirubin Urine: NEGATIVE
Glucose, UA: NEGATIVE mg/dL
Glucose, UA: NEGATIVE mg/dL
Glucose, UA: NEGATIVE mg/dL
Hgb urine dipstick: NEGATIVE
Hgb urine dipstick: NEGATIVE
Ketones, ur: NEGATIVE mg/dL
Ketones, ur: NEGATIVE mg/dL
Nitrite: NEGATIVE
Protein, ur: NEGATIVE mg/dL
Protein, ur: NEGATIVE mg/dL
Specific Gravity, Urine: 1.005 (ref 1.005–1.030)
Urobilinogen, UA: 0.2 mg/dL (ref 0.0–1.0)
pH: 5 (ref 5.0–8.0)
pH: 5 (ref 5.0–8.0)

## 2011-01-03 LAB — POCT I-STAT, CHEM 8
BUN: 4 mg/dL — ABNORMAL LOW (ref 6–23)
Calcium, Ion: 1.05 mmol/L — ABNORMAL LOW (ref 1.12–1.32)
HCT: 40 % (ref 39.0–52.0)
Sodium: 141 mEq/L (ref 135–145)
TCO2: 23 mmol/L (ref 0–100)

## 2011-01-03 LAB — AMYLASE: Amylase: 142 U/L — ABNORMAL HIGH (ref 0–105)

## 2011-01-03 LAB — PROTIME-INR
INR: 1.19 (ref 0.00–1.49)
Prothrombin Time: 14.8 seconds (ref 11.6–15.2)
Prothrombin Time: 15 seconds (ref 11.6–15.2)

## 2011-01-03 LAB — ETHANOL
Alcohol, Ethyl (B): 266 mg/dL — ABNORMAL HIGH (ref 0–10)
Alcohol, Ethyl (B): <5 mg/dL (ref 0–10)
Alcohol, Ethyl (B): 250 mg/dL — ABNORMAL HIGH (ref 0–10)
Alcohol, Ethyl (B): 368 mg/dL — ABNORMAL HIGH (ref 0–10)

## 2011-01-03 LAB — TRICYCLICS SCREEN, URINE
TCA Scrn: NOT DETECTED
TCA Scrn: NOT DETECTED

## 2011-01-03 LAB — LIPASE, BLOOD
Lipase: 72 U/L — ABNORMAL HIGH (ref 11–59)
Lipase: 36 U/L (ref 11–59)
Lipase: 40 U/L (ref 11–59)
Lipase: 40 U/L (ref 11–59)

## 2011-01-03 LAB — HEMOCCULT GUIAC POC 1CARD (OFFICE): Fecal Occult Bld: NEGATIVE

## 2011-01-03 LAB — RPR: RPR Ser Ql: NONREACTIVE

## 2011-01-03 LAB — ACETAMINOPHEN LEVEL: Acetaminophen (Tylenol), Serum: <10 ug/mL — ABNORMAL LOW (ref 10–30)

## 2011-01-03 LAB — SALICYLATE LEVEL: Salicylate Lvl: <4 mg/dL (ref 2.8–20.0)

## 2011-01-03 LAB — AMMONIA: Ammonia: 52 umol/L — ABNORMAL HIGH (ref 11–35)

## 2011-01-04 LAB — BASIC METABOLIC PANEL
BUN: 3 mg/dL — ABNORMAL LOW (ref 6–23)
BUN: 4 mg/dL — ABNORMAL LOW (ref 6–23)
CO2: 25 mEq/L (ref 19–32)
CO2: 28 mEq/L (ref 19–32)
CO2: 27 mEq/L (ref 19–32)
Calcium: 8.9 mg/dL (ref 8.4–10.5)
Calcium: 8.9 mg/dL (ref 8.4–10.5)
Chloride: 106 mEq/L (ref 96–112)
Chloride: 107 mEq/L (ref 96–112)
Chloride: 106 mEq/L (ref 96–112)
Creatinine, Ser: 0.61 mg/dL (ref 0.4–1.5)
GFR calc Af Amer: >60 mL/min (ref >60)
GFR calc Af Amer: >60 mL/min (ref >60)
GFR calc Af Amer: >60 mL/min (ref >60)
GFR calc Af Amer: >60 mL/min (ref >60)
GFR calc Af Amer: >60 mL/min (ref >60)
GFR calc non Af Amer: >60 mL/min (ref >60)
GFR calc non Af Amer: >60 mL/min (ref >60)
GFR calc non Af Amer: >60 mL/min (ref >60)
Glucose, Bld: 104 mg/dL — ABNORMAL HIGH (ref 70–99)
Glucose, Bld: 83 mg/dL (ref 70–99)
Potassium: 3.6 mEq/L (ref 3.5–5.1)
Potassium: 3.7 mEq/L (ref 3.5–5.1)
Potassium: 4 mEq/L (ref 3.5–5.1)
Potassium: 4.3 mEq/L (ref 3.5–5.1)
Sodium: 137 mEq/L (ref 135–145)
Sodium: 141 mEq/L (ref 135–145)
Sodium: 142 mEq/L (ref 135–145)
Sodium: 143 mEq/L (ref 135–145)

## 2011-01-04 LAB — DIFFERENTIAL
Basophils Absolute: 0.1 10*3/uL (ref 0.0–0.1)
Basophils Relative: 1 % (ref 0–1)
Basophils Relative: 1 % (ref 0–1)
Basophils Relative: 0 % (ref 0–1)
Basophils Relative: 1 % (ref 0–1)
Eosinophils Absolute: 0.2 10*3/uL (ref 0.0–0.7)
Eosinophils Relative: 3 % (ref 0–5)
Eosinophils Relative: 3 % (ref 0–5)
Eosinophils Relative: 4 % (ref 0–5)
Eosinophils Relative: 2 % (ref 0–5)
Lymphocytes Relative: 38 % (ref 12–46)
Lymphs Abs: 2.5 10*3/uL (ref 0.7–4.0)
Lymphs Abs: 2.7 10*3/uL (ref 0.7–4.0)
Lymphs Abs: 2.8 10*3/uL (ref 0.7–4.0)
Lymphs Abs: 3.2 10*3/uL (ref 0.7–4.0)
Monocytes Absolute: 0.5 10*3/uL (ref 0.1–1.0)
Monocytes Absolute: 0.4 10*3/uL (ref 0.1–1.0)
Monocytes Absolute: 0.6 10*3/uL (ref 0.1–1.0)
Monocytes Relative: 12 % (ref 3–12)
Monocytes Relative: 8 % (ref 3–12)
Monocytes Relative: 8 % (ref 3–12)
Neutrophils Relative %: 40 % — ABNORMAL LOW (ref 43–77)
Neutrophils Relative %: 49 % (ref 43–77)
Neutrophils Relative %: 60 % (ref 43–77)

## 2011-01-04 LAB — RAPID URINE DRUG SCREEN, HOSP PERFORMED
Amphetamines: NOT DETECTED
Amphetamines: NOT DETECTED
Barbiturates: NOT DETECTED
Barbiturates: NOT DETECTED
Benzodiazepines: POSITIVE — AB
Benzodiazepines: POSITIVE — AB
Cocaine: NOT DETECTED
Cocaine: NOT DETECTED
Opiates: NOT DETECTED
Tetrahydrocannabinol: NOT DETECTED
Tetrahydrocannabinol: NOT DETECTED
Tetrahydrocannabinol: NOT DETECTED

## 2011-01-04 LAB — CBC
HCT: 31.5 % — ABNORMAL LOW (ref 39.0–52.0)
HCT: 33.1 % — ABNORMAL LOW (ref 39.0–52.0)
HCT: 35 % — ABNORMAL LOW (ref 39.0–52.0)
HCT: 35.4 % — ABNORMAL LOW (ref 39.0–52.0)
HCT: 37.1 % — ABNORMAL LOW (ref 39.0–52.0)
HCT: 29.3 % — ABNORMAL LOW (ref 39.0–52.0)
HCT: 36.1 % — ABNORMAL LOW (ref 39.0–52.0)
Hemoglobin: 10.5 g/dL — ABNORMAL LOW (ref 13.0–17.0)
Hemoglobin: 10.6 g/dL — ABNORMAL LOW (ref 13.0–17.0)
Hemoglobin: 10.7 g/dL — ABNORMAL LOW (ref 13.0–17.0)
Hemoglobin: 11.1 g/dL — ABNORMAL LOW (ref 13.0–17.0)
Hemoglobin: 11.1 g/dL — ABNORMAL LOW (ref 13.0–17.0)
Hemoglobin: 11.7 g/dL — ABNORMAL LOW (ref 13.0–17.0)
Hemoglobin: 9.9 g/dL — ABNORMAL LOW (ref 13.0–17.0)
Hemoglobin: 11.5 g/dL — ABNORMAL LOW (ref 13.0–17.0)
Hemoglobin: 9.3 g/dL — ABNORMAL LOW (ref 13.0–17.0)
MCHC: 31.4 g/dL (ref 30.0–36.0)
MCHC: 31.5 g/dL (ref 30.0–36.0)
MCHC: 31.6 g/dL (ref 30.0–36.0)
MCHC: 32 g/dL (ref 30.0–36.0)
MCHC: 31.8 g/dL (ref 30.0–36.0)
MCV: 77.3 fL — ABNORMAL LOW (ref 78.0–100.0)
MCV: 77.3 fL — ABNORMAL LOW (ref 78.0–100.0)
MCV: 72.7 fL — ABNORMAL LOW (ref 78.0–100.0)
MCV: 73.1 fL — ABNORMAL LOW (ref 78.0–100.0)
MCV: 73.4 fL — ABNORMAL LOW (ref 78.0–100.0)
Platelets: 85 10*3/uL — ABNORMAL LOW (ref 150–400)
Platelets: 131 10*3/uL — ABNORMAL LOW (ref 150–400)
Platelets: 137 10*3/uL — ABNORMAL LOW (ref 150–400)
RBC: 4.07 MIL/uL — ABNORMAL LOW (ref 4.22–5.81)
RBC: 4.22 MIL/uL (ref 4.22–5.81)
RBC: 4.3 MIL/uL (ref 4.22–5.81)
RBC: 4.31 MIL/uL (ref 4.22–5.81)
RBC: 4.55 MIL/uL (ref 4.22–5.81)
RBC: 4.58 MIL/uL (ref 4.22–5.81)
RBC: 4.88 MIL/uL (ref 4.22–5.81)
RBC: 4 MIL/uL — ABNORMAL LOW (ref 4.22–5.81)
RBC: 4.07 MIL/uL — ABNORMAL LOW (ref 4.22–5.81)
RBC: 4.95 MIL/uL (ref 4.22–5.81)
RDW: 33.4 % — ABNORMAL HIGH (ref 11.5–15.5)
RDW: 33.6 % — ABNORMAL HIGH (ref 11.5–15.5)
RDW: 34.7 % — ABNORMAL HIGH (ref 11.5–15.5)
RDW: 34.6 % — ABNORMAL HIGH (ref 11.5–15.5)
WBC: 3.1 10*3/uL — ABNORMAL LOW (ref 4.0–10.5)
WBC: 4.6 10*3/uL (ref 4.0–10.5)
WBC: 6.7 10*3/uL (ref 4.0–10.5)
WBC: 5.6 10*3/uL (ref 4.0–10.5)
WBC: 5.8 10*3/uL (ref 4.0–10.5)
WBC: 7.6 10*3/uL (ref 4.0–10.5)

## 2011-01-04 LAB — COMPREHENSIVE METABOLIC PANEL
ALT: 49 U/L (ref 0–53)
ALT: 34 U/L (ref 0–53)
AST: 57 U/L — ABNORMAL HIGH (ref 0–37)
AST: 61 U/L — ABNORMAL HIGH (ref 0–37)
Albumin: 3.3 g/dL — ABNORMAL LOW (ref 3.5–5.2)
Albumin: 3.7 g/dL (ref 3.5–5.2)
Alkaline Phosphatase: 63 U/L (ref 39–117)
Alkaline Phosphatase: 61 U/L (ref 39–117)
Alkaline Phosphatase: 75 U/L (ref 39–117)
BUN: 5 mg/dL — ABNORMAL LOW (ref 6–23)
BUN: 4 mg/dL — ABNORMAL LOW (ref 6–23)
CO2: 27 mEq/L (ref 19–32)
CO2: 26 mEq/L (ref 19–32)
Chloride: 107 mEq/L (ref 96–112)
Chloride: 106 mEq/L (ref 96–112)
Chloride: 107 mEq/L (ref 96–112)
Chloride: 110 mEq/L (ref 96–112)
GFR calc Af Amer: >60 mL/min (ref >60)
GFR calc Af Amer: >60 mL/min (ref >60)
GFR calc non Af Amer: >60 mL/min (ref >60)
GFR calc non Af Amer: >60 mL/min (ref >60)
Glucose, Bld: 85 mg/dL (ref 70–99)
Glucose, Bld: 83 mg/dL (ref 70–99)
Potassium: 3.9 mEq/L (ref 3.5–5.1)
Potassium: 3.6 mEq/L (ref 3.5–5.1)
Potassium: 3.8 mEq/L (ref 3.5–5.1)
Potassium: 3.8 mEq/L (ref 3.5–5.1)
Sodium: 139 mEq/L (ref 135–145)
Sodium: 140 mEq/L (ref 135–145)
Total Bilirubin: 0.8 mg/dL (ref 0.3–1.2)
Total Bilirubin: 1 mg/dL (ref 0.3–1.2)
Total Bilirubin: 1 mg/dL (ref 0.3–1.2)
Total Bilirubin: 1.1 mg/dL (ref 0.3–1.2)

## 2011-01-04 LAB — HEMOGLOBIN AND HEMATOCRIT, BLOOD
HCT: 30.3 % — ABNORMAL LOW (ref 39.0–52.0)
HCT: 31.1 % — ABNORMAL LOW (ref 39.0–52.0)
Hemoglobin: 10 g/dL — ABNORMAL LOW (ref 13.0–17.0)
Hemoglobin: 9.6 g/dL — ABNORMAL LOW (ref 13.0–17.0)

## 2011-01-04 LAB — PROTIME-INR
INR: 1.19 (ref 0.00–1.49)
Prothrombin Time: 14.7 seconds (ref 11.6–15.2)

## 2011-01-04 LAB — GASTRIC OCCULT BLOOD (1-CARD TO LAB): Occult Blood, Gastric: POSITIVE — AB

## 2011-01-04 LAB — URINALYSIS, ROUTINE W REFLEX MICROSCOPIC
Bilirubin Urine: NEGATIVE
Glucose, UA: NEGATIVE mg/dL
Hgb urine dipstick: NEGATIVE
Hgb urine dipstick: NEGATIVE
Ketones, ur: NEGATIVE mg/dL
Nitrite: NEGATIVE
Nitrite: NEGATIVE
Specific Gravity, Urine: 1.007 (ref 1.005–1.030)
Specific Gravity, Urine: 1.005 (ref 1.005–1.030)
Urobilinogen, UA: 0.2 mg/dL (ref 0.0–1.0)
pH: 5.5 (ref 5.0–8.0)

## 2011-01-04 LAB — HEPATIC FUNCTION PANEL
ALT: 55 U/L — ABNORMAL HIGH (ref 0–53)
ALT: 56 U/L — ABNORMAL HIGH (ref 0–53)
AST: 91 U/L — ABNORMAL HIGH (ref 0–37)
Albumin: 3.8 g/dL (ref 3.5–5.2)
Alkaline Phosphatase: 75 U/L (ref 39–117)
Alkaline Phosphatase: 78 U/L (ref 39–117)
Bilirubin, Direct: 0.3 mg/dL (ref 0.0–0.3)
Indirect Bilirubin: 0.4 mg/dL (ref 0.3–0.9)
Total Protein: 8.8 g/dL — ABNORMAL HIGH (ref 6.0–8.3)

## 2011-01-04 LAB — ETHANOL
Alcohol, Ethyl (B): 209 mg/dL — ABNORMAL HIGH (ref 0–10)
Alcohol, Ethyl (B): 251 mg/dL — ABNORMAL HIGH (ref 0–10)
Alcohol, Ethyl (B): 149 mg/dL — ABNORMAL HIGH (ref 0–10)
Alcohol, Ethyl (B): 155 mg/dL — ABNORMAL HIGH (ref 0–10)
Alcohol, Ethyl (B): 349 mg/dL — ABNORMAL HIGH (ref 0–10)

## 2011-01-04 LAB — GLUCOSE, CAPILLARY
Glucose-Capillary: 74 mg/dL (ref 70–99)
Glucose-Capillary: 93 mg/dL (ref 70–99)

## 2011-01-04 LAB — TYPE AND SCREEN: Antibody Screen: NEGATIVE

## 2011-01-04 LAB — SAMPLE TO BLOOD BANK

## 2011-01-04 LAB — LIPID PANEL
Cholesterol: 119 mg/dL (ref 0–200)
LDL Cholesterol: 62 mg/dL (ref 0–99)
Total CHOL/HDL Ratio: 2.6 RATIO

## 2011-01-04 LAB — TRICYCLICS SCREEN, URINE: TCA Scrn: NOT DETECTED

## 2011-01-04 LAB — LIPASE, BLOOD: Lipase: 40 U/L (ref 11–59)

## 2011-01-04 LAB — MRSA PCR SCREENING: MRSA by PCR: NEGATIVE

## 2011-01-05 LAB — BASIC METABOLIC PANEL
BUN: 2 mg/dL — ABNORMAL LOW (ref 6–23)
BUN: 4 mg/dL — ABNORMAL LOW (ref 6–23)
CO2: 25 mEq/L (ref 19–32)
CO2: 26 mEq/L (ref 19–32)
Calcium: 8.1 mg/dL — ABNORMAL LOW (ref 8.4–10.5)
Calcium: 8.4 mg/dL (ref 8.4–10.5)
Chloride: 104 mEq/L (ref 96–112)
Chloride: 104 mEq/L (ref 96–112)
Creatinine, Ser: 0.67 mg/dL (ref 0.4–1.5)
GFR calc non Af Amer: >60 mL/min (ref >60)
GFR calc non Af Amer: >60 mL/min (ref >60)
Glucose, Bld: 85 mg/dL (ref 70–99)
Glucose, Bld: 120 mg/dL — ABNORMAL HIGH (ref 70–99)
Glucose, Bld: 91 mg/dL (ref 70–99)
Potassium: 3 mEq/L — ABNORMAL LOW (ref 3.5–5.1)
Potassium: 3.5 mEq/L (ref 3.5–5.1)
Sodium: 141 mEq/L (ref 135–145)

## 2011-01-05 LAB — COMPREHENSIVE METABOLIC PANEL
ALT: 34 U/L (ref 0–53)
AST: 62 U/L — ABNORMAL HIGH (ref 0–37)
AST: 63 U/L — ABNORMAL HIGH (ref 0–37)
Albumin: 3.6 g/dL (ref 3.5–5.2)
BUN: 4 mg/dL — ABNORMAL LOW (ref 6–23)
CO2: 23 mEq/L (ref 19–32)
CO2: 24 mEq/L (ref 19–32)
Calcium: 8.8 mg/dL (ref 8.4–10.5)
Calcium: 8.3 mg/dL — ABNORMAL LOW (ref 8.4–10.5)
Chloride: 108 mEq/L (ref 96–112)
Creatinine, Ser: 0.59 mg/dL (ref 0.4–1.5)
GFR calc Af Amer: >60 mL/min (ref >60)
GFR calc Af Amer: >60 mL/min (ref >60)
GFR calc non Af Amer: >60 mL/min (ref >60)
Glucose, Bld: 88 mg/dL (ref 70–99)
Sodium: 132 mEq/L — ABNORMAL LOW (ref 135–145)
Total Bilirubin: 0.9 mg/dL (ref 0.3–1.2)
Total Protein: 8 g/dL (ref 6.0–8.3)

## 2011-01-05 LAB — RETICULOCYTES
RBC.: 3.47 MIL/uL — ABNORMAL LOW (ref 4.22–5.81)
Retic Ct Pct: 1.3 % (ref 0.4–3.1)
Retic Ct Pct: 1.5 % (ref 0.4–3.1)

## 2011-01-05 LAB — CBC
HCT: 24.8 % — ABNORMAL LOW (ref 39.0–52.0)
HCT: 27.6 % — ABNORMAL LOW (ref 39.0–52.0)
HCT: 27.7 % — ABNORMAL LOW (ref 39.0–52.0)
HCT: 31 % — ABNORMAL LOW (ref 39.0–52.0)
Hemoglobin: 6.6 g/dL — CL (ref 13.0–17.0)
Hemoglobin: 8.9 g/dL — ABNORMAL LOW (ref 13.0–17.0)
Hemoglobin: 9 g/dL — ABNORMAL LOW (ref 13.0–17.0)
MCHC: 30.9 g/dL (ref 30.0–36.0)
MCHC: 31.2 g/dL (ref 30.0–36.0)
MCHC: 31.8 g/dL (ref 30.0–36.0)
MCHC: 31.9 g/dL (ref 30.0–36.0)
MCHC: 32.4 g/dL (ref 30.0–36.0)
MCV: 64.8 fL — ABNORMAL LOW (ref 78.0–100.0)
MCV: 66.6 fL — ABNORMAL LOW (ref 78.0–100.0)
MCV: 69 fL — ABNORMAL LOW (ref 78.0–100.0)
Platelets: 93 10*3/uL — ABNORMAL LOW (ref 150–400)
Platelets: 107 10*3/uL — ABNORMAL LOW (ref 150–400)
Platelets: 113 10*3/uL — ABNORMAL LOW (ref 150–400)
RBC: 3.73 MIL/uL — ABNORMAL LOW (ref 4.22–5.81)
RBC: 4.07 MIL/uL — ABNORMAL LOW (ref 4.22–5.81)
RBC: 3.93 MIL/uL — ABNORMAL LOW (ref 4.22–5.81)
RDW: 20.3 % — ABNORMAL HIGH (ref 11.5–15.5)
RDW: 20.4 % — ABNORMAL HIGH (ref 11.5–15.5)
RDW: 24.9 % — ABNORMAL HIGH (ref 11.5–15.5)
RDW: 25.7 % — ABNORMAL HIGH (ref 11.5–15.5)
WBC: 5.2 10*3/uL (ref 4.0–10.5)

## 2011-01-05 LAB — RAPID URINE DRUG SCREEN, HOSP PERFORMED
Benzodiazepines: NOT DETECTED
Cocaine: NOT DETECTED
Opiates: NOT DETECTED
Tetrahydrocannabinol: NOT DETECTED

## 2011-01-05 LAB — DIFFERENTIAL
Basophils Absolute: 0 10*3/uL (ref 0.0–0.1)
Basophils Absolute: 0.1 10*3/uL (ref 0.0–0.1)
Basophils Absolute: 0.2 10*3/uL — ABNORMAL HIGH (ref 0.0–0.1)
Basophils Relative: 0 % (ref 0–1)
Eosinophils Absolute: 0.3 10*3/uL (ref 0.0–0.7)
Eosinophils Relative: 0 % (ref 0–5)
Eosinophils Relative: 7 % — ABNORMAL HIGH (ref 0–5)
Lymphocytes Relative: 54 % — ABNORMAL HIGH (ref 12–46)
Lymphs Abs: 1.1 10*3/uL (ref 0.7–4.0)
Lymphs Abs: 2.4 10*3/uL (ref 0.7–4.0)
Lymphs Abs: 2.8 10*3/uL (ref 0.7–4.0)
Lymphs Abs: 2.8 10*3/uL (ref 0.7–4.0)
Monocytes Absolute: 0.8 10*3/uL (ref 0.1–1.0)
Monocytes Relative: 9 % (ref 3–12)
Monocytes Relative: 8 % (ref 3–12)
Neutro Abs: 5.5 10*3/uL (ref 1.7–7.7)
Neutro Abs: 1.6 10*3/uL — ABNORMAL LOW (ref 1.7–7.7)
Neutro Abs: 1.9 10*3/uL (ref 1.7–7.7)

## 2011-01-05 LAB — APTT: aPTT: 30 seconds (ref 24–37)

## 2011-01-05 LAB — POCT I-STAT, CHEM 8
Chloride: 111 mEq/L (ref 96–112)
HCT: 31 % — ABNORMAL LOW (ref 39.0–52.0)
Hemoglobin: 10.5 g/dL — ABNORMAL LOW (ref 13.0–17.0)
Potassium: 3.7 mEq/L (ref 3.5–5.1)
Sodium: 146 mEq/L — ABNORMAL HIGH (ref 135–145)

## 2011-01-05 LAB — LIPASE, BLOOD: Lipase: 39 U/L (ref 11–59)

## 2011-01-05 LAB — TYPE AND SCREEN

## 2011-01-05 LAB — URINALYSIS, ROUTINE W REFLEX MICROSCOPIC
Bilirubin Urine: NEGATIVE
Nitrite: NEGATIVE
Protein, ur: NEGATIVE mg/dL
Specific Gravity, Urine: 1.001 — ABNORMAL LOW (ref 1.005–1.030)
Urobilinogen, UA: 0.2 mg/dL (ref 0.0–1.0)

## 2011-01-05 LAB — PROTIME-INR: Prothrombin Time: 15.7 seconds — ABNORMAL HIGH (ref 11.6–15.2)

## 2011-01-05 LAB — FOLATE: Folate: 11.9 ng/mL

## 2011-01-05 LAB — TRICYCLICS SCREEN, URINE: TCA Scrn: NOT DETECTED

## 2011-01-06 LAB — ETHANOL: Alcohol, Ethyl (B): 247 mg/dL — ABNORMAL HIGH (ref 0–10)

## 2011-01-06 LAB — COMPREHENSIVE METABOLIC PANEL
ALT: 49 U/L (ref 0–53)
AST: 78 U/L — ABNORMAL HIGH (ref 0–37)
Alkaline Phosphatase: 82 U/L (ref 39–117)
GFR calc Af Amer: >60 mL/min (ref >60)
Glucose, Bld: 99 mg/dL (ref 70–99)
Potassium: 3.8 mEq/L (ref 3.5–5.1)
Sodium: 140 mEq/L (ref 135–145)
Total Protein: 8.8 g/dL — ABNORMAL HIGH (ref 6.0–8.3)

## 2011-01-06 LAB — URINALYSIS, ROUTINE W REFLEX MICROSCOPIC
Bilirubin Urine: NEGATIVE
Ketones, ur: NEGATIVE mg/dL
Nitrite: NEGATIVE
Urobilinogen, UA: 1 mg/dL (ref 0.0–1.0)

## 2011-01-06 LAB — CBC
Hemoglobin: 9.9 g/dL — ABNORMAL LOW (ref 13.0–17.0)
RBC: 4.43 MIL/uL (ref 4.22–5.81)
RDW: 27.6 % — ABNORMAL HIGH (ref 11.5–15.5)

## 2011-01-06 LAB — DIFFERENTIAL
Basophils Relative: 2 % — ABNORMAL HIGH (ref 0–1)
Eosinophils Absolute: 0.4 10*3/uL (ref 0.0–0.7)
Lymphs Abs: 2.4 10*3/uL (ref 0.7–4.0)
Monocytes Absolute: 0.6 10*3/uL (ref 0.1–1.0)
Neutro Abs: 2.6 10*3/uL (ref 1.7–7.7)
Neutrophils Relative %: 42 % — ABNORMAL LOW (ref 43–77)

## 2011-01-06 LAB — RAPID URINE DRUG SCREEN, HOSP PERFORMED
Cocaine: NOT DETECTED
Tetrahydrocannabinol: NOT DETECTED

## 2011-01-10 LAB — POCT I-STAT, CHEM 8
Chloride: 110 mEq/L (ref 96–112)
Creatinine, Ser: 0.8 mg/dL (ref 0.4–1.5)
Glucose, Bld: 98 mg/dL (ref 70–99)
HCT: 33 % — ABNORMAL LOW (ref 39.0–52.0)
Hemoglobin: 11.2 g/dL — ABNORMAL LOW (ref 13.0–17.0)
Potassium: 3.9 mEq/L (ref 3.5–5.1)
Sodium: 146 mEq/L — ABNORMAL HIGH (ref 135–145)

## 2011-01-10 LAB — COMPREHENSIVE METABOLIC PANEL
ALT: 33 U/L (ref 0–53)
Alkaline Phosphatase: 65 U/L (ref 39–117)
BUN: 10 mg/dL (ref 6–23)
CO2: 23 mEq/L (ref 19–32)
GFR calc non Af Amer: >60 mL/min (ref >60)
Glucose, Bld: 106 mg/dL — ABNORMAL HIGH (ref 70–99)
Potassium: 3.8 mEq/L (ref 3.5–5.1)
Sodium: 139 mEq/L (ref 135–145)
Total Protein: 8.5 g/dL — ABNORMAL HIGH (ref 6.0–8.3)

## 2011-01-10 LAB — DIFFERENTIAL
Basophils Absolute: 0.2 10*3/uL — ABNORMAL HIGH (ref 0.0–0.1)
Basophils Relative: 3 % — ABNORMAL HIGH (ref 0–1)
Blasts: 0 %
Eosinophils Absolute: 0.3 10*3/uL (ref 0.0–0.7)
Metamyelocytes Relative: 0 %
Myelocytes: 0 %
Neutro Abs: 1.9 10*3/uL (ref 1.7–7.7)
Neutro Abs: 3.2 10*3/uL (ref 1.7–7.7)
Neutrophils Relative %: 44 % (ref 43–77)
Neutrophils Relative %: 53 % (ref 43–77)
Promyelocytes Absolute: 0 %
nRBC: 0 /100 WBC

## 2011-01-10 LAB — LIPASE, BLOOD: Lipase: 70 U/L — ABNORMAL HIGH (ref 11–59)

## 2011-01-10 LAB — CBC
HCT: 28.1 % — ABNORMAL LOW (ref 39.0–52.0)
HCT: 29.1 % — ABNORMAL LOW (ref 39.0–52.0)
Hemoglobin: 9.2 g/dL — ABNORMAL LOW (ref 13.0–17.0)
MCHC: 31.1 g/dL (ref 30.0–36.0)
MCHC: 31.7 g/dL (ref 30.0–36.0)
MCV: 65.8 fL — ABNORMAL LOW (ref 78.0–100.0)
Platelets: 99 10*3/uL — ABNORMAL LOW (ref 150–400)
RBC: 4.21 MIL/uL — ABNORMAL LOW (ref 4.22–5.81)
RDW: 21.2 % — ABNORMAL HIGH (ref 11.5–15.5)
RDW: 24.6 % — ABNORMAL HIGH (ref 11.5–15.5)
WBC: 4.4 10*3/uL (ref 4.0–10.5)

## 2011-01-12 ENCOUNTER — Emergency Department (HOSPITAL_COMMUNITY)
Admission: EM | Admit: 2011-01-12 | Discharge: 2011-01-12 | Disposition: A | Discharge status: Home / Self Care | Payer: Medicaid Other | Attending: Emergency Medicine | Admitting: Emergency Medicine

## 2011-01-12 DIAGNOSIS — I10 Essential (primary) hypertension: Secondary | ICD-10-CM | POA: Insufficient documentation

## 2011-01-12 DIAGNOSIS — Z8619 Personal history of other infectious and parasitic diseases: Secondary | ICD-10-CM | POA: Insufficient documentation

## 2011-01-12 DIAGNOSIS — F1010 Alcohol abuse, uncomplicated: Secondary | ICD-10-CM | POA: Insufficient documentation

## 2011-01-12 LAB — COMPREHENSIVE METABOLIC PANEL
ALT: 49 U/L (ref 0–53)
Albumin: 4 g/dL (ref 3.5–5.2)
Calcium: 8.8 mg/dL (ref 8.4–10.5)
GFR calc Af Amer: >60 mL/min (ref >60)
Glucose, Bld: 78 mg/dL (ref 70–99)
Sodium: 134 mEq/L — ABNORMAL LOW (ref 135–145)
Total Protein: 8.6 g/dL — ABNORMAL HIGH (ref 6.0–8.3)

## 2011-01-12 LAB — URINALYSIS, ROUTINE W REFLEX MICROSCOPIC
Glucose, UA: NEGATIVE mg/dL
Hgb urine dipstick: NEGATIVE
Ketones, ur: 15 mg/dL — AB
pH: 5.5 (ref 5.0–8.0)

## 2011-01-12 LAB — DIFFERENTIAL
Basophils Absolute: 0.1 10*3/uL (ref 0.0–0.1)
Eosinophils Absolute: 0.1 10*3/uL (ref 0.0–0.7)
Lymphocytes Relative: 49 % — ABNORMAL HIGH (ref 12–46)
Monocytes Absolute: 0.6 10*3/uL (ref 0.1–1.0)
Neutrophils Relative %: 39 % — ABNORMAL LOW (ref 43–77)

## 2011-01-12 LAB — CBC
MCV: 84.9 fL (ref 78.0–100.0)
Platelets: 90 10*3/uL — ABNORMAL LOW (ref 150–400)
RBC: 4.71 MIL/uL (ref 4.22–5.81)
RDW: 17.4 % — ABNORMAL HIGH (ref 11.5–15.5)
WBC: 6.3 10*3/uL (ref 4.0–10.5)

## 2011-01-12 LAB — RAPID URINE DRUG SCREEN, HOSP PERFORMED
Barbiturates: NOT DETECTED
Benzodiazepines: NOT DETECTED
Cocaine: NOT DETECTED

## 2011-01-12 LAB — ETHANOL: Alcohol, Ethyl (B): 207 mg/dL — ABNORMAL HIGH (ref 0–10)

## 2011-01-13 IMAGING — CR DG ABDOMEN ACUTE W/ 1V CHEST
4 series · 4 of 4 positions shown · non-contrast
Comparison: 11/22/2008 and earlier.  CT abdomen and pelvis
01/22/2008.

CLINICAL DATA: 52-year-old male with hematemesis.  Abdominal pain.

ACUTE ABDOMEN SERIES (ABDOMEN 2 VIEW & CHEST 1 VIEW)

[w chest pa]
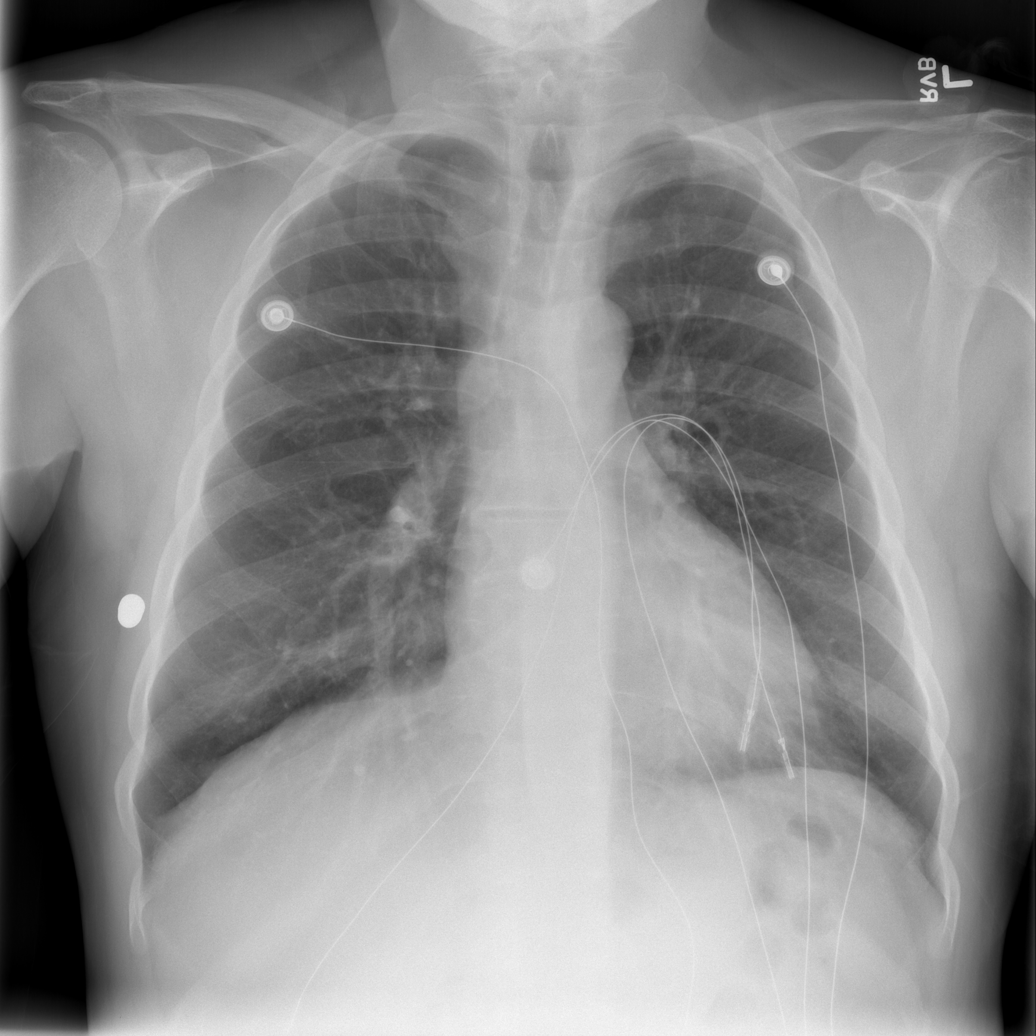

[w abdomen upright *]
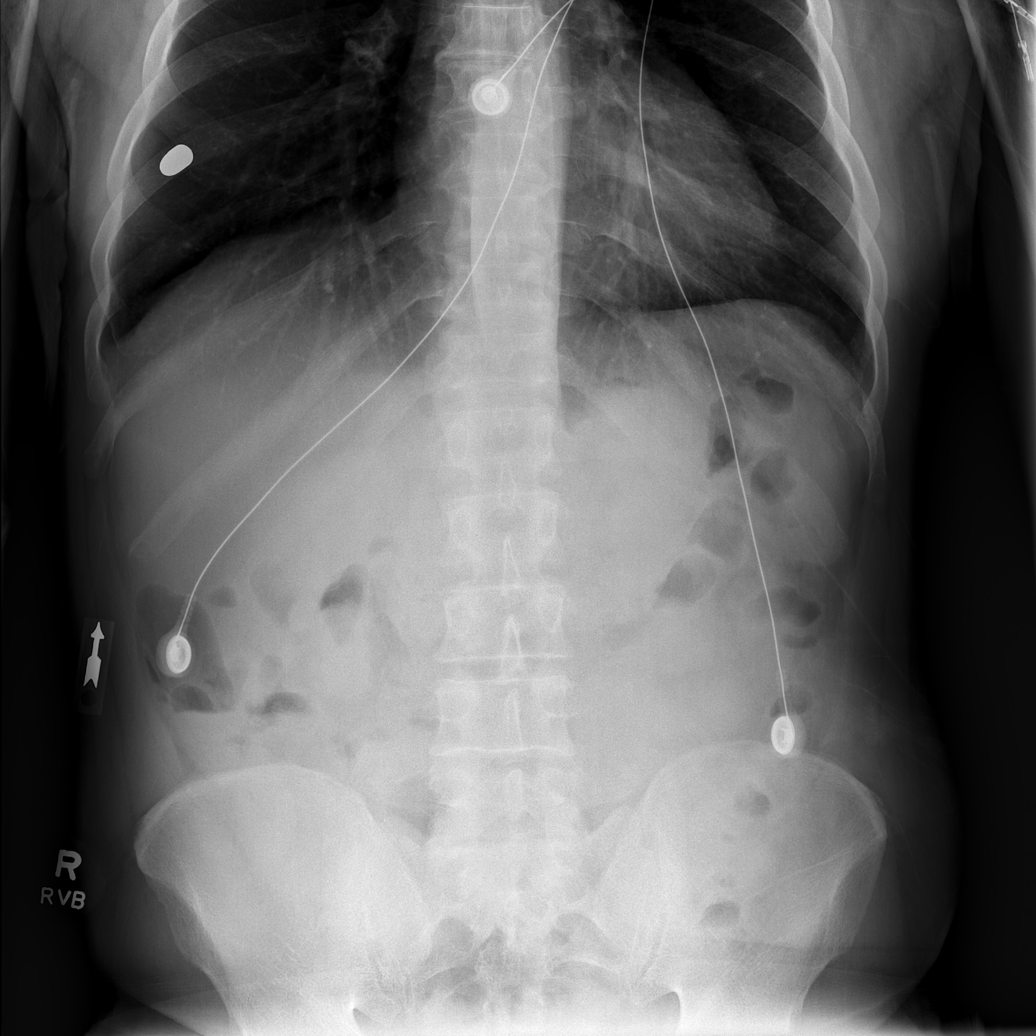

[t abdomen supine (1 of 2)]
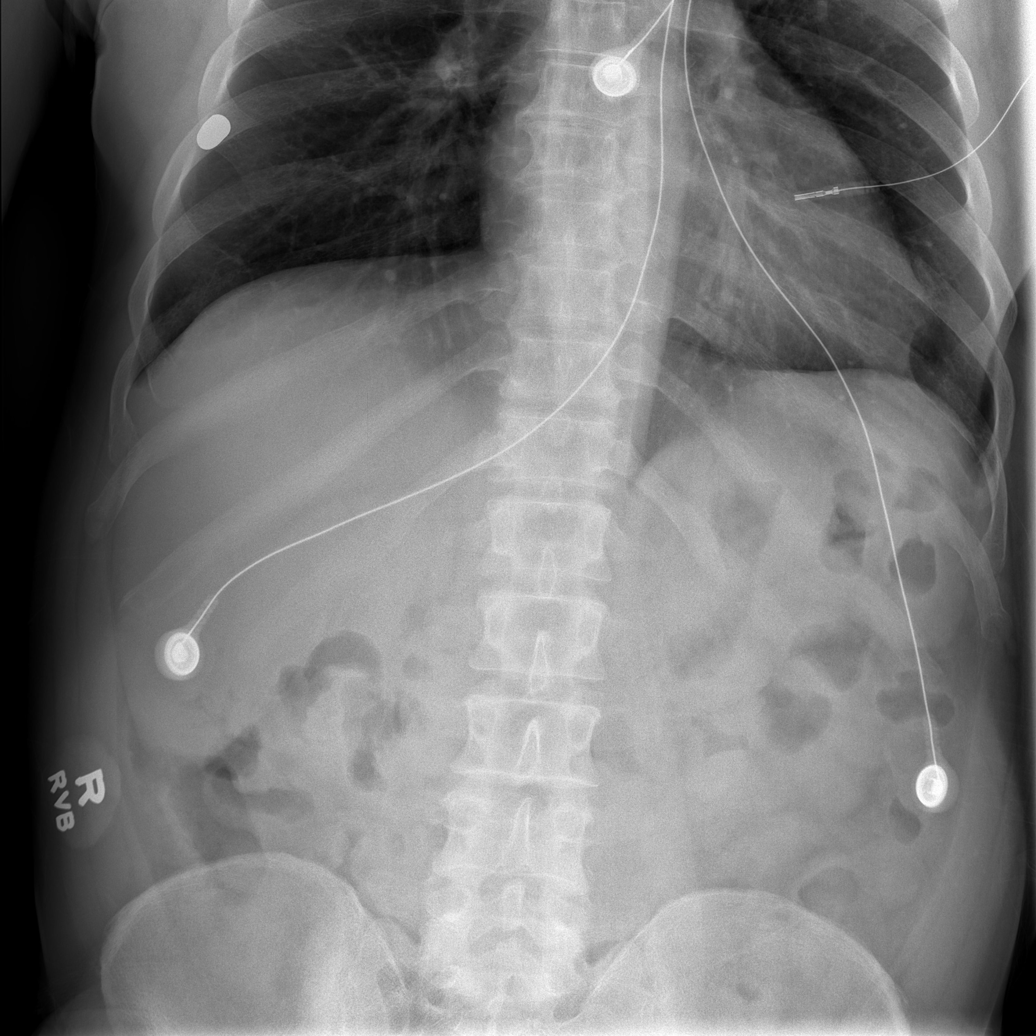

[t abdomen supine (2 of 2)]
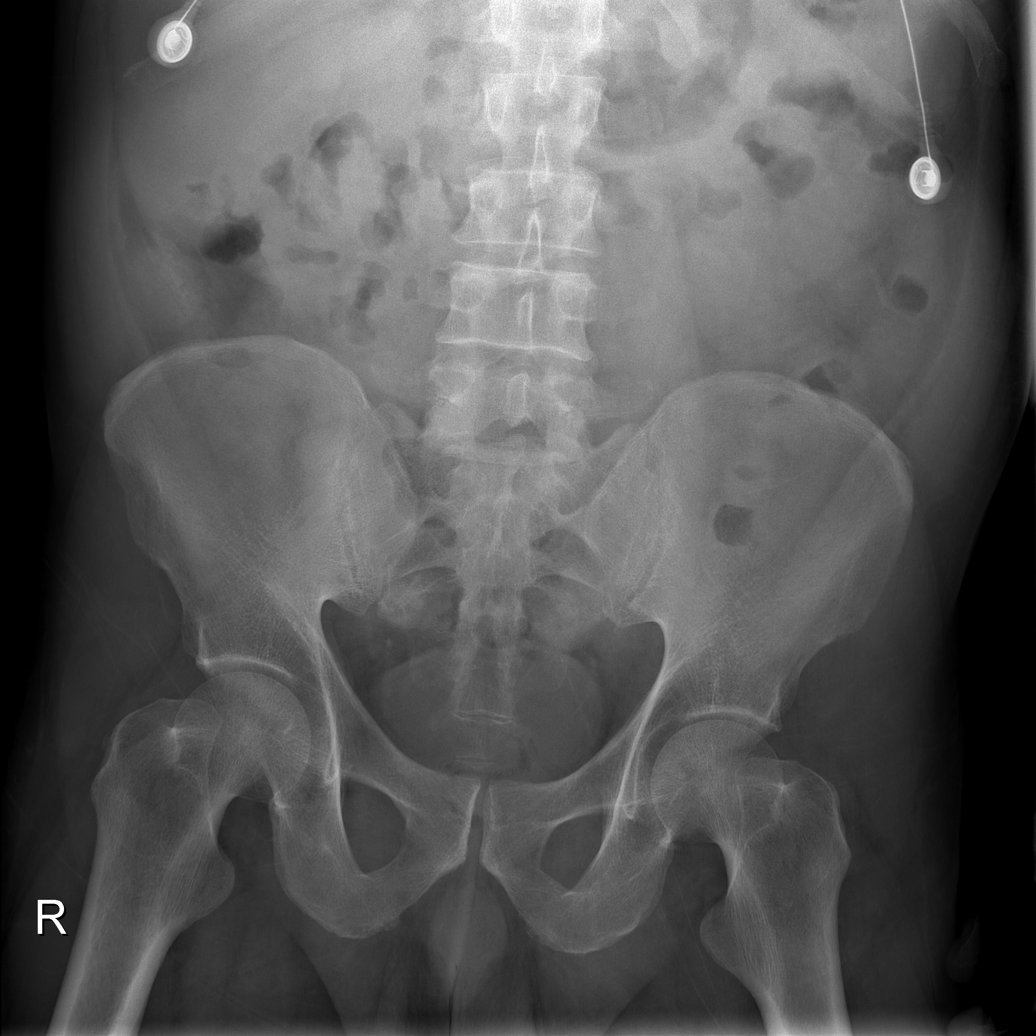

[4 of 4 positions shown; findings below may reference images not displayed]

FINDINGS: Better lung inflation.  Cardiac size and mediastinal
contours are within normal limits.  No pneumothorax or
pneumoperitoneum.  No acute airspace opacity.

Nonobstructed bowel gas pattern. Stable visualized osseous
structures.  Visceral contours are within normal limits.  Stomach
is decompressed.
IMPRESSION: 1. Nonobstructed bowel gas pattern, no free air.
2. No acute cardiopulmonary abnormality.

## 2011-01-14 IMAGING — CT CT PELVIS W/ CM
2 of 4 series · 17 of 46 positions shown, 19 images · IV contrast (agent unspecified)
Comparison: 01/22/2008

CT ABDOMEN

CLINICAL DATA: GI bleed.  Epigastric abdominal pain.

CT ABDOMEN AND PELVIS WITH CONTRAST
TECHNIQUE: Multidetector CT imaging of the abdomen and pelvis was
performed using the standard protocol following bolus
administration of intravenous contrast.
Contrast: 100 ml of Fmnipaque-7PP

[Series 3: rtn a/p with · axial · 0.74mm/px · z∈[+890,+1296]mm · 14 of 91 slices shown, 16 images]
[im 5/91  soft-tissue]
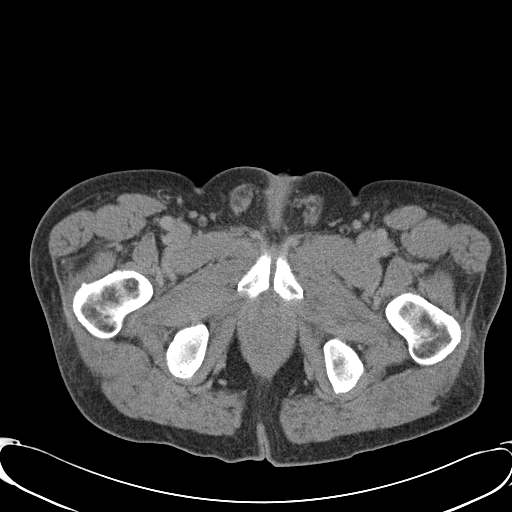
[im 5/91  bone]
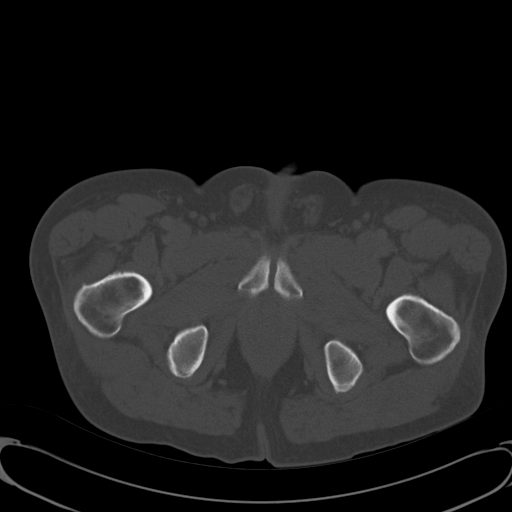
[im 13/91  soft-tissue]
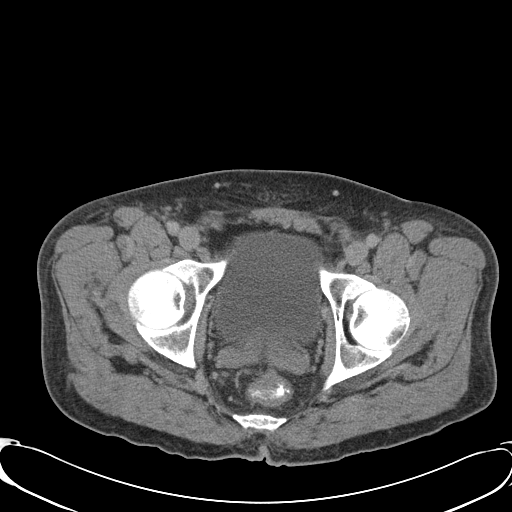
[im 17/91  soft-tissue]
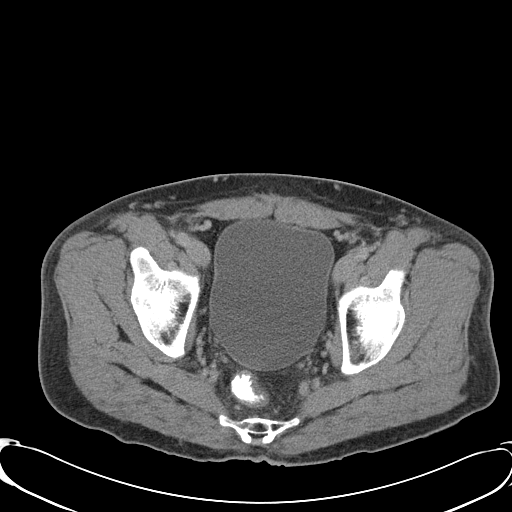
[im 25/91  soft-tissue]
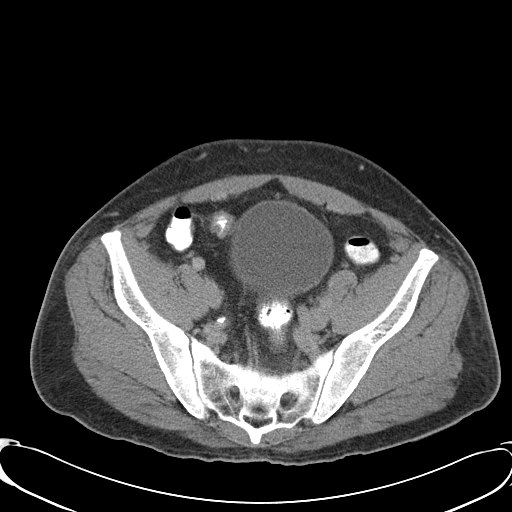
[im 29/91  soft-tissue]
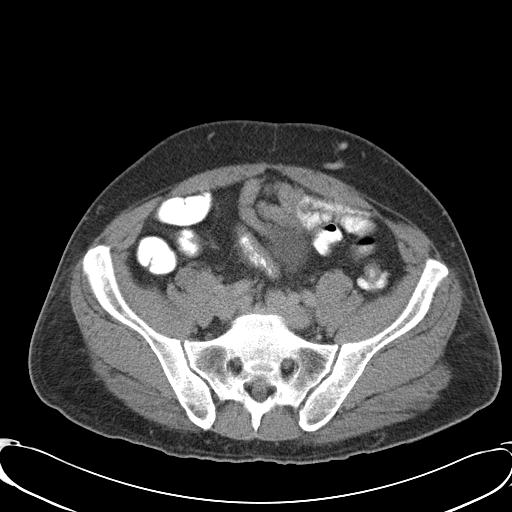
[im 37/91  soft-tissue]
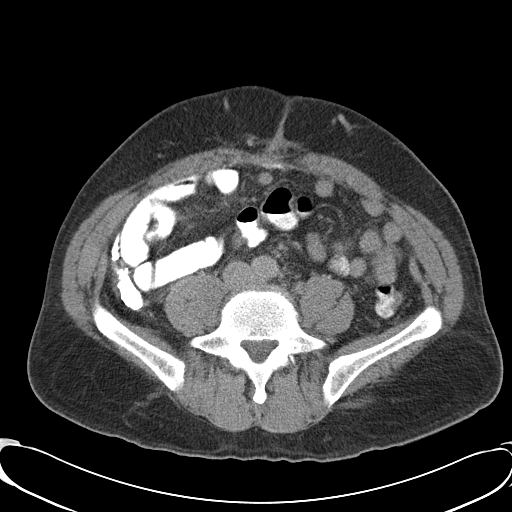
[im 41/91  soft-tissue]
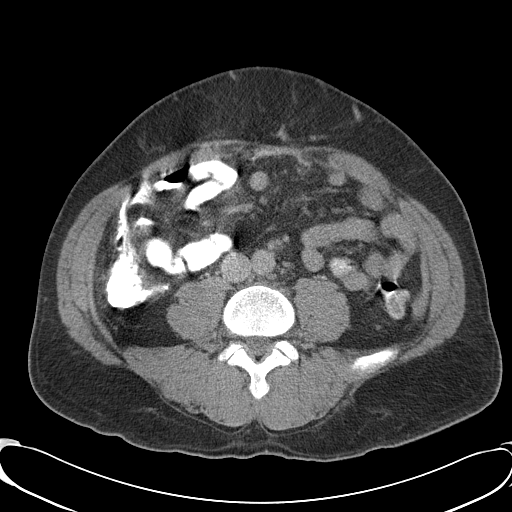
[im 50/91  soft-tissue]
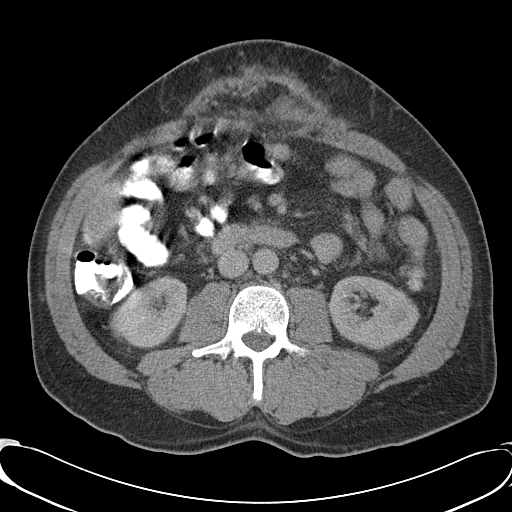
[im 54/91  soft-tissue]
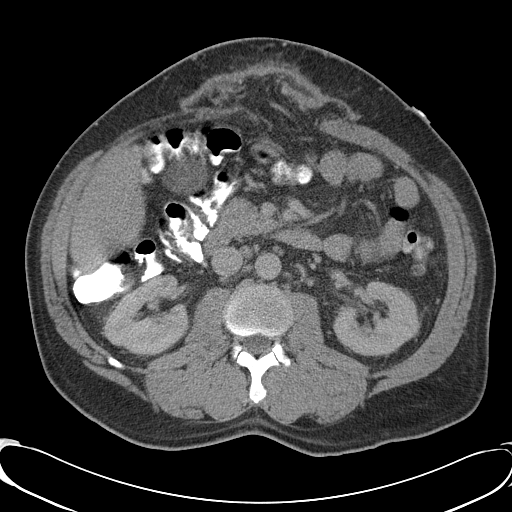
[im 54/91  bone]
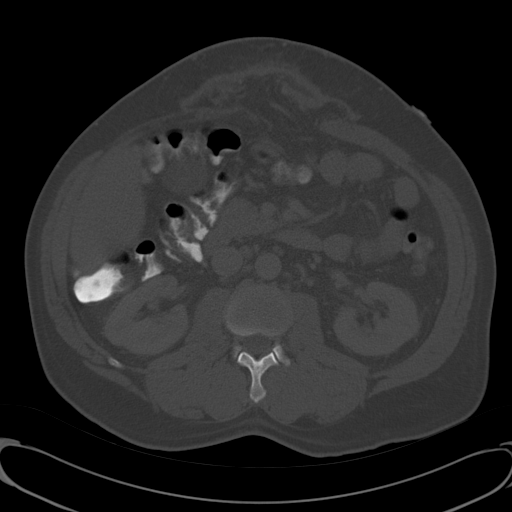
[im 62/91  soft-tissue]
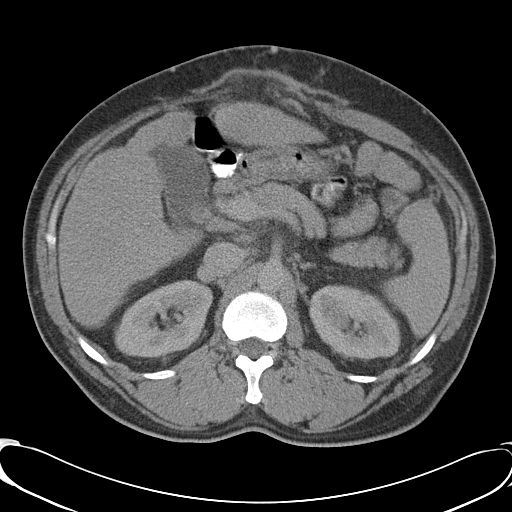
[im 66/91  soft-tissue]
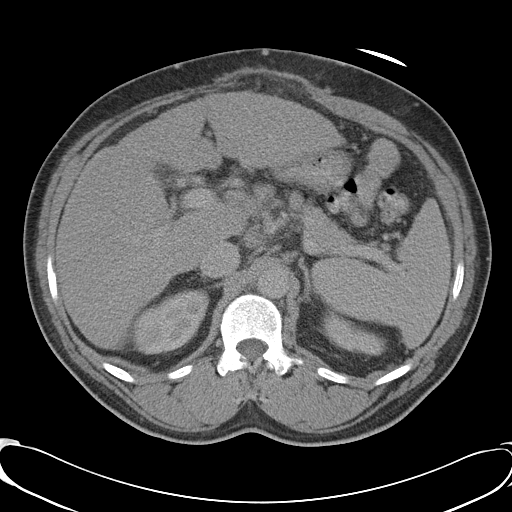
[im 74/91  soft-tissue]
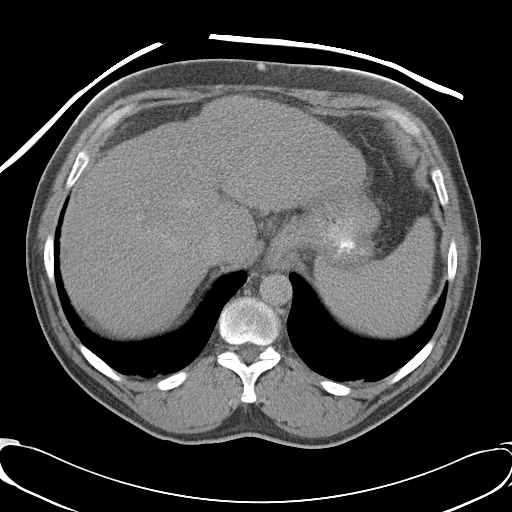
[im 78/91  soft-tissue]
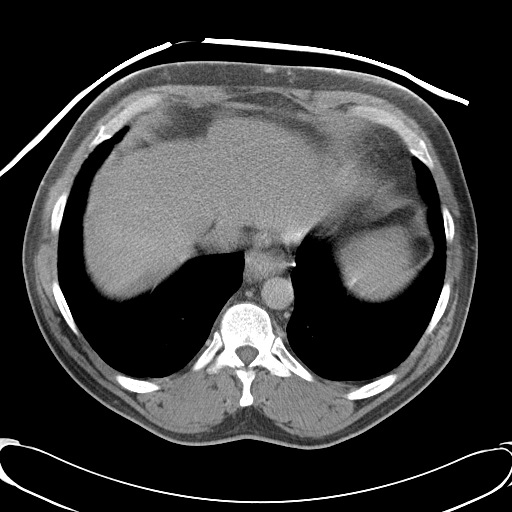
[im 86/91  soft-tissue]
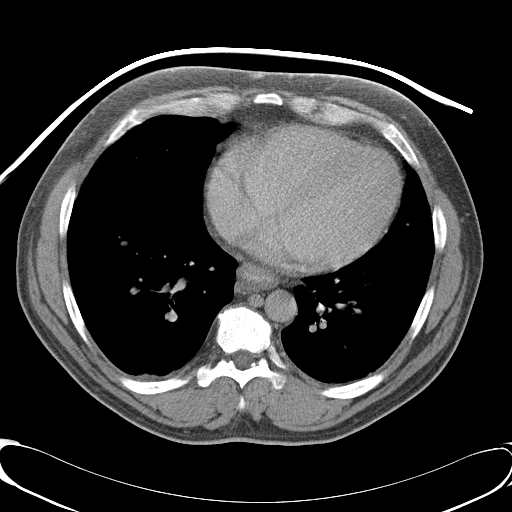

[Series 602: <mpr thick range> · coronal · 0.89mm/px · 3 of 95 slices shown]
[im 32/95  soft-tissue]
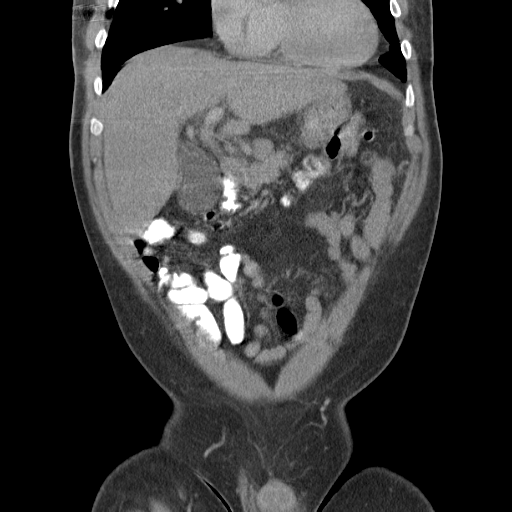
[im 42/95  soft-tissue]
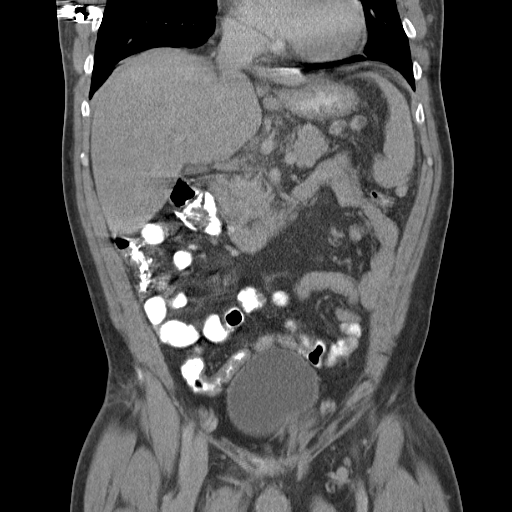
[im 53/95  soft-tissue]
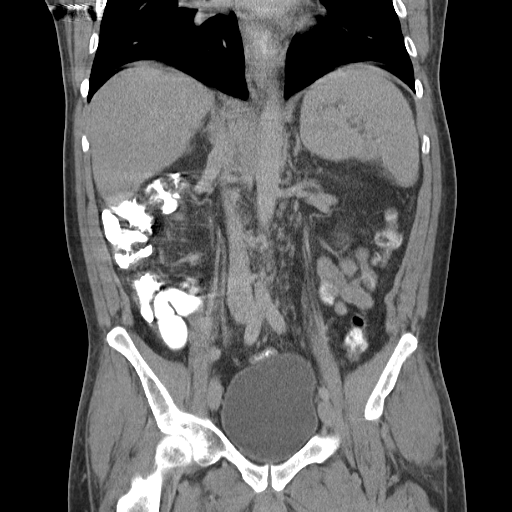

[17 of 46 positions shown; findings below may reference images not displayed]

FINDINGS: The lung bases are clear except for dependent
atelectasis.  A metallic foreign body is noted in the right lower
chest wall.

The liver demonstrates cirrhotic changes.  No obvious mass lesion
or biliary distention.  The gallbladder is mildly distended.  No CT
findings to suggest acute cholecystitis.  The spleen is within
normal limits in size.  There are portal venous collaterals and
gastrohepatic, periportal and celiac axis adenopathy which is
stable and likely due to cirrhosis.  The pancreas demonstrates no
significant abnormalities.  The adrenal glands and kidneys
demonstrate no significant findings.

The stomach is not well descend with contrast.  There is a hiatal
hernia.  There is also an anterior abdominal wall hernia which is
recurrent.  This contains omental fat vessels but no bowel.  No
significant change since the prior CT scan.

The aorta is normal in caliber.  There are small scattered
retroperitoneal lymph nodes.

The bony structures are unremarkable.
IMPRESSION: 1.  Cirrhotic changes involving the liver but no worrisome hepatic
mass.
2.  Stable portal venous collaterals and abdominal adenopathy.
3.  Stable recurrent into abdominal wall hernia.
4.  No acute abdominal findings.

CT PELVIS
FINDINGS: The rectum, sigmoid colon and visualized small bowel
loops unremarkable.  The bladder appears normal.  No pelvic masses,
adenopathy or free pelvic fluid collections.  The bony pelvis is
intact.  No inguinal mass or hernia.
IMPRESSION: 1.  No acute pelvic findings.
2.  No pelvic mass or adenopathy.
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
## 2011-01-17 LAB — COMPREHENSIVE METABOLIC PANEL
ALT: 69 U/L — ABNORMAL HIGH (ref 0–53)
AST: 110 U/L — ABNORMAL HIGH (ref 0–37)
AST: 93 U/L — ABNORMAL HIGH (ref 0–37)
Albumin: 2.6 g/dL — ABNORMAL LOW (ref 3.5–5.2)
Albumin: 3 g/dL — ABNORMAL LOW (ref 3.5–5.2)
Albumin: 3.2 g/dL — ABNORMAL LOW (ref 3.5–5.2)
Alkaline Phosphatase: 63 U/L (ref 39–117)
Alkaline Phosphatase: 64 U/L (ref 39–117)
Alkaline Phosphatase: 77 U/L (ref 39–117)
BUN: 9 mg/dL (ref 6–23)
Calcium: 8.3 mg/dL — ABNORMAL LOW (ref 8.4–10.5)
Chloride: 109 mEq/L (ref 96–112)
Chloride: 110 mEq/L (ref 96–112)
Creatinine, Ser: 0.65 mg/dL (ref 0.4–1.5)
GFR calc Af Amer: >60 mL/min (ref >60)
GFR calc Af Amer: >60 mL/min (ref >60)
Glucose, Bld: 112 mg/dL — ABNORMAL HIGH (ref 70–99)
Potassium: 3.5 mEq/L (ref 3.5–5.1)
Potassium: 3.5 mEq/L (ref 3.5–5.1)
Potassium: 3.8 mEq/L (ref 3.5–5.1)
Sodium: 139 mEq/L (ref 135–145)
Sodium: 140 mEq/L (ref 135–145)
Total Bilirubin: 0.7 mg/dL (ref 0.3–1.2)
Total Bilirubin: 0.9 mg/dL (ref 0.3–1.2)
Total Protein: 6.3 g/dL (ref 6.0–8.3)

## 2011-01-17 LAB — CBC
HCT: 21.6 % — ABNORMAL LOW (ref 39.0–52.0)
HCT: 23 % — ABNORMAL LOW (ref 39.0–52.0)
HCT: 23.2 % — ABNORMAL LOW (ref 39.0–52.0)
Hemoglobin: 7.3 g/dL — ABNORMAL LOW (ref 13.0–17.0)
Hemoglobin: 7.3 g/dL — ABNORMAL LOW (ref 13.0–17.0)
Hemoglobin: 7.9 g/dL — ABNORMAL LOW (ref 13.0–17.0)
MCHC: 30.7 g/dL (ref 30.0–36.0)
MCHC: 30.7 g/dL (ref 30.0–36.0)
MCHC: 31.3 g/dL (ref 30.0–36.0)
MCHC: 32.1 g/dL (ref 30.0–36.0)
MCV: 65.3 fL — ABNORMAL LOW (ref 78.0–100.0)
MCV: 68.3 fL — ABNORMAL LOW (ref 78.0–100.0)
MCV: 68.8 fL — ABNORMAL LOW (ref 78.0–100.0)
MCV: 68.9 fL — ABNORMAL LOW (ref 78.0–100.0)
Platelets: 109 10*3/uL — ABNORMAL LOW (ref 150–400)
Platelets: 119 10*3/uL — ABNORMAL LOW (ref 150–400)
Platelets: 154 10*3/uL (ref 150–400)
Platelets: 203 10*3/uL (ref 150–400)
RBC: 3.36 MIL/uL — ABNORMAL LOW (ref 4.22–5.81)
RBC: 3.44 MIL/uL — ABNORMAL LOW (ref 4.22–5.81)
RDW: 21.4 % — ABNORMAL HIGH (ref 11.5–15.5)
RDW: 23.3 % — ABNORMAL HIGH (ref 11.5–15.5)
RDW: 24 % — ABNORMAL HIGH (ref 11.5–15.5)
WBC: 3.8 10*3/uL — ABNORMAL LOW (ref 4.0–10.5)
WBC: 5.6 10*3/uL (ref 4.0–10.5)
WBC: 8.2 10*3/uL (ref 4.0–10.5)

## 2011-01-17 LAB — POCT I-STAT, CHEM 8
BUN: 5 mg/dL — ABNORMAL LOW (ref 6–23)
Calcium, Ion: 1.1 mmol/L — ABNORMAL LOW (ref 1.12–1.32)
Creatinine, Ser: 0.5 mg/dL (ref 0.4–1.5)
TCO2: 26 mmol/L (ref 0–100)

## 2011-01-17 LAB — IRON: Iron: <10 ug/dL — ABNORMAL LOW (ref 42–135)

## 2011-01-17 LAB — CROSSMATCH: ABO/RH(D): O POS

## 2011-01-17 LAB — VITAMIN B12: Vitamin B-12: 497 pg/mL (ref 211–911)

## 2011-01-17 LAB — URINALYSIS, ROUTINE W REFLEX MICROSCOPIC
Bilirubin Urine: NEGATIVE
Ketones, ur: NEGATIVE mg/dL
Nitrite: NEGATIVE
Urobilinogen, UA: 4 mg/dL — ABNORMAL HIGH (ref 0.0–1.0)
pH: 6.5 (ref 5.0–8.0)

## 2011-01-17 LAB — DIFFERENTIAL
Basophils Absolute: 0.1 10*3/uL (ref 0.0–0.1)
Eosinophils Absolute: 0.3 10*3/uL (ref 0.0–0.7)
Lymphs Abs: 3.1 10*3/uL (ref 0.7–4.0)
Monocytes Relative: 9 % (ref 3–12)
Neutro Abs: 4.1 10*3/uL (ref 1.7–7.7)

## 2011-01-17 LAB — FOLATE: Folate: 11.2 ng/mL

## 2011-01-17 LAB — GLUCOSE, CAPILLARY
Glucose-Capillary: 102 mg/dL — ABNORMAL HIGH (ref 70–99)
Glucose-Capillary: 105 mg/dL — ABNORMAL HIGH (ref 70–99)
Glucose-Capillary: 107 mg/dL — ABNORMAL HIGH (ref 70–99)
Glucose-Capillary: 109 mg/dL — ABNORMAL HIGH (ref 70–99)
Glucose-Capillary: 110 mg/dL — ABNORMAL HIGH (ref 70–99)
Glucose-Capillary: 120 mg/dL — ABNORMAL HIGH (ref 70–99)
Glucose-Capillary: 121 mg/dL — ABNORMAL HIGH (ref 70–99)
Glucose-Capillary: 89 mg/dL (ref 70–99)

## 2011-01-17 LAB — PROTIME-INR
INR: 1.17 (ref 0.00–1.49)
Prothrombin Time: 14.8 seconds (ref 11.6–15.2)

## 2011-01-17 LAB — APTT: aPTT: 30 seconds (ref 24–37)

## 2011-01-17 LAB — FERRITIN: Ferritin: 12 ng/mL — ABNORMAL LOW (ref 22–322)

## 2011-01-18 LAB — COMPREHENSIVE METABOLIC PANEL
ALT: 78 U/L — ABNORMAL HIGH (ref 0–53)
AST: 117 U/L — ABNORMAL HIGH (ref 0–37)
Alkaline Phosphatase: 74 U/L (ref 39–117)
CO2: 25 mEq/L (ref 19–32)
Chloride: 106 mEq/L (ref 96–112)
GFR calc Af Amer: >60 mL/min (ref >60)
GFR calc non Af Amer: >60 mL/min (ref >60)
Potassium: 3.5 mEq/L (ref 3.5–5.1)
Sodium: 138 mEq/L (ref 135–145)
Total Bilirubin: 1.5 mg/dL — ABNORMAL HIGH (ref 0.3–1.2)

## 2011-01-18 LAB — DIFFERENTIAL
Basophils Absolute: 0 10*3/uL (ref 0.0–0.1)
Eosinophils Absolute: 0.1 10*3/uL (ref 0.0–0.7)
Lymphs Abs: 2.1 10*3/uL (ref 0.7–4.0)
Monocytes Absolute: 0.4 10*3/uL (ref 0.1–1.0)
Monocytes Relative: 9 % (ref 3–12)
Neutro Abs: 2.1 10*3/uL (ref 1.7–7.7)

## 2011-01-18 LAB — CBC
Hemoglobin: 8.6 g/dL — ABNORMAL LOW (ref 13.0–17.0)
Hemoglobin: 7.7 g/dL — ABNORMAL LOW (ref 13.0–17.0)
Hemoglobin: 7.9 g/dL — ABNORMAL LOW (ref 13.0–17.0)
MCHC: 30.6 g/dL (ref 30.0–36.0)
MCHC: 30.9 g/dL (ref 30.0–36.0)
MCV: 68.2 fL — ABNORMAL LOW (ref 78.0–100.0)
RBC: 4.1 MIL/uL — ABNORMAL LOW (ref 4.22–5.81)
RBC: 3.8 MIL/uL — ABNORMAL LOW (ref 4.22–5.81)
RDW: 21.5 % — ABNORMAL HIGH (ref 11.5–15.5)
WBC: 4.7 10*3/uL (ref 4.0–10.5)

## 2011-01-18 LAB — BASIC METABOLIC PANEL
BUN: 5 mg/dL — ABNORMAL LOW (ref 6–23)
CO2: 25 mEq/L (ref 19–32)
Calcium: 8.7 mg/dL (ref 8.4–10.5)
Glucose, Bld: 102 mg/dL — ABNORMAL HIGH (ref 70–99)
Sodium: 137 mEq/L (ref 135–145)

## 2011-01-18 LAB — RETICULOCYTES
RBC.: 3.97 MIL/uL — ABNORMAL LOW (ref 4.22–5.81)
Retic Ct Pct: 1.1 % (ref 0.4–3.1)

## 2011-01-18 LAB — IRON AND TIBC
Saturation Ratios: 3 % — ABNORMAL LOW (ref 20–55)
TIBC: 514 ug/dL — ABNORMAL HIGH (ref 215–435)

## 2011-01-18 LAB — FOLATE: Folate: >20 ng/mL

## 2011-01-18 LAB — PROTIME-INR: INR: 1.29 (ref 0.00–1.49)

## 2011-01-18 LAB — RAPID URINE DRUG SCREEN, HOSP PERFORMED: Tetrahydrocannabinol: NOT DETECTED

## 2011-01-18 LAB — ACETAMINOPHEN LEVEL: Acetaminophen (Tylenol), Serum: <10 ug/mL — ABNORMAL LOW (ref 10–30)

## 2011-01-19 LAB — BASIC METABOLIC PANEL
BUN: 10 mg/dL (ref 6–23)
CO2: 25 mEq/L (ref 19–32)
Calcium: 8.4 mg/dL (ref 8.4–10.5)
Calcium: 8.8 mg/dL (ref 8.4–10.5)
Calcium: 8.3 mg/dL — ABNORMAL LOW (ref 8.4–10.5)
Chloride: 105 mEq/L (ref 96–112)
Creatinine, Ser: 0.55 mg/dL (ref 0.4–1.5)
Creatinine, Ser: 0.7 mg/dL (ref 0.4–1.5)
Creatinine, Ser: 0.6 mg/dL (ref 0.4–1.5)
GFR calc Af Amer: >60 mL/min (ref >60)
GFR calc Af Amer: >60 mL/min (ref >60)
GFR calc Af Amer: >60 mL/min (ref >60)
GFR calc non Af Amer: >60 mL/min (ref >60)
GFR calc non Af Amer: >60 mL/min (ref >60)
GFR calc non Af Amer: >60 mL/min (ref >60)
Glucose, Bld: 101 mg/dL — ABNORMAL HIGH (ref 70–99)
Glucose, Bld: 97 mg/dL (ref 70–99)
Potassium: 3.7 mEq/L (ref 3.5–5.1)
Sodium: 137 mEq/L (ref 135–145)
Sodium: 134 mEq/L — ABNORMAL LOW (ref 135–145)

## 2011-01-19 LAB — COMPREHENSIVE METABOLIC PANEL
ALT: 35 U/L (ref 0–53)
ALT: 37 U/L (ref 0–53)
ALT: 39 U/L (ref 0–53)
ALT: 27 U/L (ref 0–53)
ALT: 42 U/L (ref 0–53)
ALT: 67 U/L — ABNORMAL HIGH (ref 0–53)
AST: 65 U/L — ABNORMAL HIGH (ref 0–37)
AST: 69 U/L — ABNORMAL HIGH (ref 0–37)
AST: 109 U/L — ABNORMAL HIGH (ref 0–37)
AST: 61 U/L — ABNORMAL HIGH (ref 0–37)
AST: 81 U/L — ABNORMAL HIGH (ref 0–37)
Albumin: 2.9 g/dL — ABNORMAL LOW (ref 3.5–5.2)
Albumin: 2.9 g/dL — ABNORMAL LOW (ref 3.5–5.2)
Albumin: 3.6 g/dL (ref 3.5–5.2)
Albumin: 2.8 g/dL — ABNORMAL LOW (ref 3.5–5.2)
Alkaline Phosphatase: 66 U/L (ref 39–117)
Alkaline Phosphatase: 68 U/L (ref 39–117)
Alkaline Phosphatase: 76 U/L (ref 39–117)
Alkaline Phosphatase: 59 U/L (ref 39–117)
Alkaline Phosphatase: 65 U/L (ref 39–117)
Alkaline Phosphatase: 85 U/L (ref 39–117)
BUN: 3 mg/dL — ABNORMAL LOW (ref 6–23)
BUN: 3 mg/dL — ABNORMAL LOW (ref 6–23)
BUN: 6 mg/dL (ref 6–23)
BUN: 3 mg/dL — ABNORMAL LOW (ref 6–23)
CO2: 21 mEq/L (ref 19–32)
CO2: 27 mEq/L (ref 19–32)
CO2: 26 mEq/L (ref 19–32)
CO2: 26 mEq/L (ref 19–32)
CO2: 27 mEq/L (ref 19–32)
CO2: 28 mEq/L (ref 19–32)
Calcium: 8.4 mg/dL (ref 8.4–10.5)
Chloride: 105 mEq/L (ref 96–112)
Chloride: 105 mEq/L (ref 96–112)
Chloride: 105 mEq/L (ref 96–112)
Chloride: 106 mEq/L (ref 96–112)
Chloride: 107 mEq/L (ref 96–112)
Chloride: 105 mEq/L (ref 96–112)
Chloride: 108 mEq/L (ref 96–112)
Chloride: 112 mEq/L (ref 96–112)
Creatinine, Ser: 0.54 mg/dL (ref 0.4–1.5)
Creatinine, Ser: 0.55 mg/dL (ref 0.4–1.5)
Creatinine, Ser: 0.59 mg/dL (ref 0.4–1.5)
GFR calc Af Amer: >60 mL/min (ref >60)
GFR calc Af Amer: >60 mL/min (ref >60)
GFR calc Af Amer: >60 mL/min (ref >60)
GFR calc Af Amer: >60 mL/min (ref >60)
GFR calc Af Amer: >60 mL/min (ref >60)
GFR calc non Af Amer: >60 mL/min (ref >60)
GFR calc non Af Amer: >60 mL/min (ref >60)
GFR calc non Af Amer: >60 mL/min (ref >60)
GFR calc non Af Amer: >60 mL/min (ref >60)
GFR calc non Af Amer: >60 mL/min (ref >60)
Glucose, Bld: 106 mg/dL — ABNORMAL HIGH (ref 70–99)
Glucose, Bld: 137 mg/dL — ABNORMAL HIGH (ref 70–99)
Glucose, Bld: 94 mg/dL (ref 70–99)
Potassium: 3.1 mEq/L — ABNORMAL LOW (ref 3.5–5.1)
Potassium: 3.6 mEq/L (ref 3.5–5.1)
Potassium: 3.9 mEq/L (ref 3.5–5.1)
Potassium: 4.1 mEq/L (ref 3.5–5.1)
Potassium: 3.5 mEq/L (ref 3.5–5.1)
Potassium: 3.7 mEq/L (ref 3.5–5.1)
Sodium: 136 mEq/L (ref 135–145)
Sodium: 138 mEq/L (ref 135–145)
Sodium: 138 mEq/L (ref 135–145)
Sodium: 140 mEq/L (ref 135–145)
Total Bilirubin: 0.8 mg/dL (ref 0.3–1.2)
Total Bilirubin: 1 mg/dL (ref 0.3–1.2)
Total Bilirubin: 1.4 mg/dL — ABNORMAL HIGH (ref 0.3–1.2)
Total Bilirubin: 1.4 mg/dL — ABNORMAL HIGH (ref 0.3–1.2)
Total Bilirubin: 0.7 mg/dL (ref 0.3–1.2)
Total Bilirubin: 0.7 mg/dL (ref 0.3–1.2)
Total Bilirubin: 1.2 mg/dL (ref 0.3–1.2)
Total Bilirubin: 1.4 mg/dL — ABNORMAL HIGH (ref 0.3–1.2)
Total Protein: 7.1 g/dL (ref 6.0–8.3)

## 2011-01-19 LAB — CK TOTAL AND CKMB (NOT AT ARMC)
CK, MB: 0.8 ng/mL (ref 0.3–4.0)
CK, MB: 1.5 ng/mL (ref 0.3–4.0)
Total CK: 142 U/L (ref 7–232)
Total CK: 215 U/L (ref 7–232)

## 2011-01-19 LAB — ETHANOL
Alcohol, Ethyl (B): 262 mg/dL — ABNORMAL HIGH (ref 0–10)
Alcohol, Ethyl (B): 311 mg/dL — ABNORMAL HIGH (ref 0–10)
Alcohol, Ethyl (B): <5 mg/dL (ref 0–10)
Alcohol, Ethyl (B): 143 mg/dL — ABNORMAL HIGH (ref 0–10)

## 2011-01-19 LAB — TYPE AND SCREEN
ABO/RH(D): O POS
Antibody Screen: NEGATIVE

## 2011-01-19 LAB — DIFFERENTIAL
Basophils Absolute: 0 10*3/uL (ref 0.0–0.1)
Basophils Absolute: 0 10*3/uL (ref 0.0–0.1)
Basophils Absolute: 0.1 10*3/uL (ref 0.0–0.1)
Basophils Absolute: 0.1 10*3/uL (ref 0.0–0.1)
Basophils Absolute: 0 10*3/uL (ref 0.0–0.1)
Basophils Relative: 0 % (ref 0–1)
Basophils Relative: 0 % (ref 0–1)
Basophils Relative: 1 % (ref 0–1)
Basophils Relative: 1 % (ref 0–1)
Basophils Relative: 1 % (ref 0–1)
Eosinophils Absolute: 0.2 10*3/uL (ref 0.0–0.7)
Eosinophils Absolute: 0.2 10*3/uL (ref 0.0–0.7)
Eosinophils Absolute: 0.2 10*3/uL (ref 0.0–0.7)
Eosinophils Absolute: 0.2 10*3/uL (ref 0.0–0.7)
Eosinophils Absolute: 0.4 10*3/uL (ref 0.0–0.7)
Eosinophils Absolute: 0.4 10*3/uL (ref 0.0–0.7)
Eosinophils Relative: 4 % (ref 0–5)
Eosinophils Relative: 7 % — ABNORMAL HIGH (ref 0–5)
Eosinophils Relative: 7 % — ABNORMAL HIGH (ref 0–5)
Lymphocytes Relative: 35 % (ref 12–46)
Lymphocytes Relative: 38 % (ref 12–46)
Lymphocytes Relative: 50 % — ABNORMAL HIGH (ref 12–46)
Lymphocytes Relative: 25 % (ref 12–46)
Lymphocytes Relative: 35 % (ref 12–46)
Lymphs Abs: 2.1 10*3/uL (ref 0.7–4.0)
Lymphs Abs: 1.1 10*3/uL (ref 0.7–4.0)
Monocytes Absolute: 0.7 10*3/uL (ref 0.1–1.0)
Monocytes Absolute: 0.3 10*3/uL (ref 0.1–1.0)
Monocytes Relative: 11 % (ref 3–12)
Monocytes Relative: 13 % — ABNORMAL HIGH (ref 3–12)
Monocytes Relative: 14 % — ABNORMAL HIGH (ref 3–12)
Monocytes Relative: 7 % (ref 3–12)
Neutro Abs: 2.3 10*3/uL (ref 1.7–7.7)
Neutro Abs: 2.7 10*3/uL (ref 1.7–7.7)
Neutro Abs: 2.8 10*3/uL (ref 1.7–7.7)
Neutrophils Relative %: 39 % — ABNORMAL LOW (ref 43–77)
Neutrophils Relative %: 44 % (ref 43–77)
Neutrophils Relative %: 32 % — ABNORMAL LOW (ref 43–77)
Neutrophils Relative %: 49 % (ref 43–77)
Neutrophils Relative %: 62 % (ref 43–77)

## 2011-01-19 LAB — CBC
HCT: 25.9 % — ABNORMAL LOW (ref 39.0–52.0)
HCT: 26.1 % — ABNORMAL LOW (ref 39.0–52.0)
HCT: 28.7 % — ABNORMAL LOW (ref 39.0–52.0)
Hemoglobin: 9.1 g/dL — ABNORMAL LOW (ref 13.0–17.0)
Hemoglobin: 9.3 g/dL — ABNORMAL LOW (ref 13.0–17.0)
Hemoglobin: 8.8 g/dL — ABNORMAL LOW (ref 13.0–17.0)
Hemoglobin: 9.4 g/dL — ABNORMAL LOW (ref 13.0–17.0)
MCHC: 30.9 g/dL (ref 30.0–36.0)
MCHC: 32.2 g/dL (ref 30.0–36.0)
MCHC: 32.2 g/dL (ref 30.0–36.0)
MCV: 71.1 fL — ABNORMAL LOW (ref 78.0–100.0)
MCV: 72.6 fL — ABNORMAL LOW (ref 78.0–100.0)
MCV: 73 fL — ABNORMAL LOW (ref 78.0–100.0)
MCV: 73.3 fL — ABNORMAL LOW (ref 78.0–100.0)
Platelets: 116 10*3/uL — ABNORMAL LOW (ref 150–400)
Platelets: 123 10*3/uL — ABNORMAL LOW (ref 150–400)
Platelets: 131 10*3/uL — ABNORMAL LOW (ref 150–400)
Platelets: 163 10*3/uL (ref 150–400)
RBC: 3.65 MIL/uL — ABNORMAL LOW (ref 4.22–5.81)
RBC: 4.19 MIL/uL — ABNORMAL LOW (ref 4.22–5.81)
RBC: 3.71 MIL/uL — ABNORMAL LOW (ref 4.22–5.81)
RBC: 4 MIL/uL — ABNORMAL LOW (ref 4.22–5.81)
RDW: 22.6 % — ABNORMAL HIGH (ref 11.5–15.5)
RDW: 22.7 % — ABNORMAL HIGH (ref 11.5–15.5)
RDW: 21 % — ABNORMAL HIGH (ref 11.5–15.5)
WBC: 3.4 10*3/uL — ABNORMAL LOW (ref 4.0–10.5)
WBC: 4 10*3/uL (ref 4.0–10.5)
WBC: 5.5 10*3/uL (ref 4.0–10.5)
WBC: 6.1 10*3/uL (ref 4.0–10.5)
WBC: 4.4 10*3/uL (ref 4.0–10.5)
WBC: 6 10*3/uL (ref 4.0–10.5)

## 2011-01-19 LAB — RAPID URINE DRUG SCREEN, HOSP PERFORMED
Amphetamines: NOT DETECTED
Amphetamines: NOT DETECTED
Barbiturates: NOT DETECTED
Barbiturates: NOT DETECTED
Benzodiazepines: NOT DETECTED
Cocaine: NOT DETECTED
Opiates: NOT DETECTED
Opiates: NOT DETECTED
Tetrahydrocannabinol: NOT DETECTED
Tetrahydrocannabinol: NOT DETECTED

## 2011-01-19 LAB — URINALYSIS, ROUTINE W REFLEX MICROSCOPIC
Bilirubin Urine: NEGATIVE
Ketones, ur: NEGATIVE mg/dL
Nitrite: NEGATIVE
Urobilinogen, UA: 1 mg/dL (ref 0.0–1.0)

## 2011-01-19 LAB — CROSSMATCH

## 2011-01-19 LAB — HEMOGLOBIN AND HEMATOCRIT, BLOOD
HCT: 25.5 % — ABNORMAL LOW (ref 39.0–52.0)
HCT: 26.2 % — ABNORMAL LOW (ref 39.0–52.0)
HCT: 26.2 % — ABNORMAL LOW (ref 39.0–52.0)
Hemoglobin: 8.2 g/dL — ABNORMAL LOW (ref 13.0–17.0)
Hemoglobin: 8.3 g/dL — ABNORMAL LOW (ref 13.0–17.0)
Hemoglobin: 8.5 g/dL — ABNORMAL LOW (ref 13.0–17.0)
Hemoglobin: 8.5 g/dL — ABNORMAL LOW (ref 13.0–17.0)
Hemoglobin: 9 g/dL — ABNORMAL LOW (ref 13.0–17.0)

## 2011-01-19 LAB — CARDIAC PANEL(CRET KIN+CKTOT+MB+TROPI): CK, MB: 1.3 ng/mL (ref 0.3–4.0)

## 2011-01-19 LAB — LIPID PANEL
HDL: 41 mg/dL (ref >39)
Triglycerides: 31 mg/dL (ref <150)
VLDL: 6 mg/dL (ref 0–40)

## 2011-01-19 LAB — AMYLASE: Amylase: 170 U/L — ABNORMAL HIGH (ref 27–131)

## 2011-01-19 LAB — TROPONIN I: Troponin I: 0.02 ng/mL (ref 0.00–0.06)

## 2011-01-19 LAB — PROTIME-INR
INR: 1.25 (ref 0.00–1.49)
Prothrombin Time: 15.1 seconds (ref 11.6–15.2)

## 2011-01-19 LAB — LACTATE DEHYDROGENASE: LDH: 139 U/L (ref 94–250)

## 2011-01-19 LAB — LIPASE, BLOOD: Lipase: 65 U/L — ABNORMAL HIGH (ref 11–59)

## 2011-01-20 LAB — CBC
HCT: 22.7 % — ABNORMAL LOW (ref 39.0–52.0)
HCT: 25.9 % — ABNORMAL LOW (ref 39.0–52.0)
HCT: 26.1 % — ABNORMAL LOW (ref 39.0–52.0)
HCT: 26.3 % — ABNORMAL LOW (ref 39.0–52.0)
HCT: 26.5 % — ABNORMAL LOW (ref 39.0–52.0)
HCT: 21.2 % — ABNORMAL LOW (ref 39.0–52.0)
HCT: 21.4 % — ABNORMAL LOW (ref 39.0–52.0)
HCT: 26.5 % — ABNORMAL LOW (ref 39.0–52.0)
Hemoglobin: 7.7 g/dL — CL (ref 13.0–17.0)
Hemoglobin: 8.6 g/dL — ABNORMAL LOW (ref 13.0–17.0)
Hemoglobin: 7.1 g/dL — CL (ref 13.0–17.0)
Hemoglobin: 8.4 g/dL — ABNORMAL LOW (ref 13.0–17.0)
MCHC: 32.2 g/dL (ref 30.0–36.0)
MCHC: 33 g/dL (ref 30.0–36.0)
MCHC: 33.3 g/dL (ref 30.0–36.0)
MCV: 74.3 fL — ABNORMAL LOW (ref 78.0–100.0)
MCV: 76 fL — ABNORMAL LOW (ref 78.0–100.0)
MCV: 76 fL — ABNORMAL LOW (ref 78.0–100.0)
MCV: 76 fL — ABNORMAL LOW (ref 78.0–100.0)
MCV: 76.1 fL — ABNORMAL LOW (ref 78.0–100.0)
MCV: 76.3 fL — ABNORMAL LOW (ref 78.0–100.0)
MCV: 74.5 fL — ABNORMAL LOW (ref 78.0–100.0)
Platelets: 118 10*3/uL — ABNORMAL LOW (ref 150–400)
Platelets: 119 10*3/uL — ABNORMAL LOW (ref 150–400)
Platelets: 120 10*3/uL — ABNORMAL LOW (ref 150–400)
Platelets: 123 10*3/uL — ABNORMAL LOW (ref 150–400)
Platelets: 172 10*3/uL (ref 150–400)
Platelets: 70 10*3/uL — ABNORMAL LOW (ref 150–400)
Platelets: 95 10*3/uL — ABNORMAL LOW (ref 150–400)
Platelets: 110 10*3/uL — ABNORMAL LOW (ref 150–400)
RBC: 2.98 MIL/uL — ABNORMAL LOW (ref 4.22–5.81)
RBC: 2.77 MIL/uL — ABNORMAL LOW (ref 4.22–5.81)
RBC: 3.56 MIL/uL — ABNORMAL LOW (ref 4.22–5.81)
RDW: 19.4 % — ABNORMAL HIGH (ref 11.5–15.5)
RDW: 19.5 % — ABNORMAL HIGH (ref 11.5–15.5)
RDW: 19.9 % — ABNORMAL HIGH (ref 11.5–15.5)
RDW: 20.1 % — ABNORMAL HIGH (ref 11.5–15.5)
RDW: 20.3 % — ABNORMAL HIGH (ref 11.5–15.5)
WBC: 3.3 10*3/uL — ABNORMAL LOW (ref 4.0–10.5)
WBC: 3.6 10*3/uL — ABNORMAL LOW (ref 4.0–10.5)
WBC: 3.7 10*3/uL — ABNORMAL LOW (ref 4.0–10.5)
WBC: 3.7 10*3/uL — ABNORMAL LOW (ref 4.0–10.5)
WBC: 6.3 10*3/uL (ref 4.0–10.5)
WBC: 6.8 10*3/uL (ref 4.0–10.5)
WBC: 9.1 10*3/uL (ref 4.0–10.5)

## 2011-01-20 LAB — DIFFERENTIAL
Basophils Absolute: 0 10*3/uL (ref 0.0–0.1)
Eosinophils Relative: 4 % (ref 0–5)
Lymphocytes Relative: 33 % (ref 12–46)
Lymphocytes Relative: 35 % (ref 12–46)
Lymphs Abs: 1.2 10*3/uL (ref 0.7–4.0)
Monocytes Absolute: 0.9 10*3/uL (ref 0.1–1.0)
Monocytes Relative: 13 % — ABNORMAL HIGH (ref 3–12)
Neutro Abs: 1.9 10*3/uL (ref 1.7–7.7)
Neutro Abs: 3.1 10*3/uL (ref 1.7–7.7)
Neutrophils Relative %: 51 % (ref 43–77)

## 2011-01-20 LAB — HEMOGLOBIN AND HEMATOCRIT, BLOOD
HCT: 26 % — ABNORMAL LOW (ref 39.0–52.0)
HCT: 26.1 % — ABNORMAL LOW (ref 39.0–52.0)
HCT: 26.7 % — ABNORMAL LOW (ref 39.0–52.0)
HCT: 26.8 % — ABNORMAL LOW (ref 39.0–52.0)
HCT: 28.1 % — ABNORMAL LOW (ref 39.0–52.0)
HCT: 28.3 % — ABNORMAL LOW (ref 39.0–52.0)
HCT: 21.6 % — ABNORMAL LOW (ref 39.0–52.0)
Hemoglobin: 8.4 g/dL — ABNORMAL LOW (ref 13.0–17.0)
Hemoglobin: 8.5 g/dL — ABNORMAL LOW (ref 13.0–17.0)
Hemoglobin: 8.5 g/dL — ABNORMAL LOW (ref 13.0–17.0)

## 2011-01-20 LAB — TYPE AND SCREEN
ABO/RH(D): O POS
Antibody Screen: NEGATIVE
Antibody Screen: NEGATIVE

## 2011-01-20 LAB — COMPREHENSIVE METABOLIC PANEL
AST: 44 U/L — ABNORMAL HIGH (ref 0–37)
AST: 59 U/L — ABNORMAL HIGH (ref 0–37)
AST: 73 U/L — ABNORMAL HIGH (ref 0–37)
Albumin: 2.6 g/dL — ABNORMAL LOW (ref 3.5–5.2)
Albumin: 2.7 g/dL — ABNORMAL LOW (ref 3.5–5.2)
Albumin: 3.4 g/dL — ABNORMAL LOW (ref 3.5–5.2)
BUN: 2 mg/dL — ABNORMAL LOW (ref 6–23)
BUN: 4 mg/dL — ABNORMAL LOW (ref 6–23)
Calcium: 8.1 mg/dL — ABNORMAL LOW (ref 8.4–10.5)
Calcium: 8.4 mg/dL (ref 8.4–10.5)
Chloride: 107 mEq/L (ref 96–112)
Creatinine, Ser: 0.58 mg/dL (ref 0.4–1.5)
Creatinine, Ser: 0.68 mg/dL (ref 0.4–1.5)
Creatinine, Ser: 0.75 mg/dL (ref 0.4–1.5)
GFR calc Af Amer: >60 mL/min (ref >60)
GFR calc Af Amer: >60 mL/min (ref >60)
GFR calc Af Amer: >60 mL/min (ref >60)
GFR calc non Af Amer: >60 mL/min (ref >60)
Potassium: 4.2 mEq/L (ref 3.5–5.1)
Total Bilirubin: 0.8 mg/dL (ref 0.3–1.2)
Total Protein: 6.4 g/dL (ref 6.0–8.3)
Total Protein: 7.8 g/dL (ref 6.0–8.3)

## 2011-01-20 LAB — BASIC METABOLIC PANEL
BUN: 3 mg/dL — ABNORMAL LOW (ref 6–23)
Chloride: 104 mEq/L (ref 96–112)
GFR calc non Af Amer: >60 mL/min (ref >60)
Glucose, Bld: 91 mg/dL (ref 70–99)
Potassium: 3.6 mEq/L (ref 3.5–5.1)
Potassium: 3.8 mEq/L (ref 3.5–5.1)
Sodium: 136 mEq/L (ref 135–145)
Sodium: 139 mEq/L (ref 135–145)

## 2011-01-20 LAB — HEMOCCULT GUIAC POC 1CARD (OFFICE): Fecal Occult Bld: POSITIVE

## 2011-01-20 LAB — POCT I-STAT, CHEM 8
BUN: 3 mg/dL — ABNORMAL LOW (ref 6–23)
Chloride: 104 meq/L (ref 96–112)
Sodium: 143 meq/L (ref 135–145)
TCO2: 22 mmol/L (ref 0–100)

## 2011-01-20 LAB — LACTIC ACID, PLASMA: Lactic Acid, Venous: 7.9 mmol/L — ABNORMAL HIGH (ref 0.5–2.2)

## 2011-01-20 LAB — MRSA PCR SCREENING: MRSA by PCR: POSITIVE — AB

## 2011-01-20 LAB — ABO/RH: ABO/RH(D): O POS

## 2011-01-20 LAB — PREPARE RBC (CROSSMATCH)

## 2011-01-20 LAB — PROTIME-INR: INR: 1.19 (ref 0.00–1.49)

## 2011-01-21 LAB — RAPID URINE DRUG SCREEN, HOSP PERFORMED
Amphetamines: NOT DETECTED
Amphetamines: NOT DETECTED
Barbiturates: NOT DETECTED
Barbiturates: NOT DETECTED
Barbiturates: NOT DETECTED
Benzodiazepines: POSITIVE — AB
Benzodiazepines: POSITIVE — AB
Cocaine: NOT DETECTED
Cocaine: NOT DETECTED
Cocaine: NOT DETECTED
Opiates: NOT DETECTED
Opiates: NOT DETECTED
Opiates: NOT DETECTED
Tetrahydrocannabinol: NOT DETECTED
Tetrahydrocannabinol: NOT DETECTED

## 2011-01-21 LAB — CBC
HCT: 29.2 % — ABNORMAL LOW (ref 39.0–52.0)
Hemoglobin: 10.3 g/dL — ABNORMAL LOW (ref 13.0–17.0)
Hemoglobin: 9.3 g/dL — ABNORMAL LOW (ref 13.0–17.0)
Hemoglobin: 9.6 g/dL — ABNORMAL LOW (ref 13.0–17.0)
MCHC: 32 g/dL (ref 30.0–36.0)
MCV: 71.7 fL — ABNORMAL LOW (ref 78.0–100.0)
RBC: 3.97 MIL/uL — ABNORMAL LOW (ref 4.22–5.81)
RDW: 28.8 % — ABNORMAL HIGH (ref 11.5–15.5)
RDW: 29.5 % — ABNORMAL HIGH (ref 11.5–15.5)
WBC: 5.3 10*3/uL (ref 4.0–10.5)

## 2011-01-21 LAB — COMPREHENSIVE METABOLIC PANEL
ALT: 27 U/L (ref 0–53)
ALT: 32 U/L (ref 0–53)
AST: 50 U/L — ABNORMAL HIGH (ref 0–37)
Alkaline Phosphatase: 67 U/L (ref 39–117)
BUN: 5 mg/dL — ABNORMAL LOW (ref 6–23)
CO2: 23 mEq/L (ref 19–32)
CO2: 26 mEq/L (ref 19–32)
Calcium: 8.8 mg/dL (ref 8.4–10.5)
Calcium: 8.8 mg/dL (ref 8.4–10.5)
Creatinine, Ser: 0.83 mg/dL (ref 0.4–1.5)
GFR calc Af Amer: >60 mL/min (ref >60)
GFR calc non Af Amer: >60 mL/min (ref >60)
Glucose, Bld: 102 mg/dL — ABNORMAL HIGH (ref 70–99)
Glucose, Bld: 93 mg/dL (ref 70–99)
Potassium: 3.4 mEq/L — ABNORMAL LOW (ref 3.5–5.1)
Sodium: 141 mEq/L (ref 135–145)
Sodium: 141 mEq/L (ref 135–145)
Total Protein: 8 g/dL (ref 6.0–8.3)
Total Protein: 8.9 g/dL — ABNORMAL HIGH (ref 6.0–8.3)

## 2011-01-21 LAB — DIFFERENTIAL
Basophils Absolute: 0.1 10*3/uL (ref 0.0–0.1)
Basophils Relative: 1 % (ref 0–1)
Eosinophils Relative: 3 % (ref 0–5)
Eosinophils Relative: 5 % (ref 0–5)
Eosinophils Relative: 9 % — ABNORMAL HIGH (ref 0–5)
Lymphocytes Relative: 35 % (ref 12–46)
Lymphocytes Relative: 51 % — ABNORMAL HIGH (ref 12–46)
Lymphs Abs: 1.8 10*3/uL (ref 0.7–4.0)
Lymphs Abs: 2 10*3/uL (ref 0.7–4.0)
Monocytes Absolute: 0.5 10*3/uL (ref 0.1–1.0)
Monocytes Absolute: 0.6 10*3/uL (ref 0.1–1.0)
Monocytes Relative: 10 % (ref 3–12)
Monocytes Relative: 11 % (ref 3–12)
Neutro Abs: 2.3 10*3/uL (ref 1.7–7.7)
Neutro Abs: 2.3 10*3/uL (ref 1.7–7.7)
Neutrophils Relative %: 34 % — ABNORMAL LOW (ref 43–77)

## 2011-01-21 LAB — URINALYSIS, ROUTINE W REFLEX MICROSCOPIC
Bilirubin Urine: NEGATIVE
Bilirubin Urine: NEGATIVE
Glucose, UA: NEGATIVE mg/dL
Glucose, UA: NEGATIVE mg/dL
Hgb urine dipstick: NEGATIVE
Ketones, ur: NEGATIVE mg/dL
Nitrite: NEGATIVE
Specific Gravity, Urine: 1.007 (ref 1.005–1.030)
Specific Gravity, Urine: 1.008 (ref 1.005–1.030)
Urobilinogen, UA: 0.2 mg/dL (ref 0.0–1.0)
pH: 5 (ref 5.0–8.0)
pH: 5 (ref 5.0–8.0)

## 2011-01-21 LAB — TRICYCLICS SCREEN, URINE: TCA Scrn: NOT DETECTED

## 2011-01-21 LAB — BASIC METABOLIC PANEL
GFR calc Af Amer: >60 mL/min (ref >60)
GFR calc non Af Amer: >60 mL/min (ref >60)
Potassium: 3.6 mEq/L (ref 3.5–5.1)
Sodium: 139 mEq/L (ref 135–145)

## 2011-01-21 LAB — ACETAMINOPHEN LEVEL: Acetaminophen (Tylenol), Serum: <10 ug/mL — ABNORMAL LOW (ref 10–30)

## 2011-01-21 LAB — ETHANOL
Alcohol, Ethyl (B): 113 mg/dL — ABNORMAL HIGH (ref 0–10)
Alcohol, Ethyl (B): 143 mg/dL — ABNORMAL HIGH (ref 0–10)
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
## 2011-01-22 LAB — CBC
HCT: 30.1 % — ABNORMAL LOW (ref 39.0–52.0)
HCT: 32.4 % — ABNORMAL LOW (ref 39.0–52.0)
Hemoglobin: 8.6 g/dL — ABNORMAL LOW (ref 13.0–17.0)
MCHC: 32.6 g/dL (ref 30.0–36.0)
MCHC: 31.5 g/dL (ref 30.0–36.0)
MCHC: 32.6 g/dL (ref 30.0–36.0)
MCV: 72.4 fL — ABNORMAL LOW (ref 78.0–100.0)
MCV: 73.3 fL — ABNORMAL LOW (ref 78.0–100.0)
Platelets: 151 10*3/uL (ref 150–400)
Platelets: 180 10*3/uL (ref 150–400)
Platelets: 190 10*3/uL (ref 150–400)
Platelets: 222 10*3/uL (ref 150–400)
RBC: 4.03 MIL/uL — ABNORMAL LOW (ref 4.22–5.81)
RDW: 28.8 % — ABNORMAL HIGH (ref 11.5–15.5)
RDW: 28 % — ABNORMAL HIGH (ref 11.5–15.5)
RDW: 28.7 % — ABNORMAL HIGH (ref 11.5–15.5)
WBC: 5.2 10*3/uL (ref 4.0–10.5)
WBC: 10.1 10*3/uL (ref 4.0–10.5)
WBC: 7.8 10*3/uL (ref 4.0–10.5)

## 2011-01-22 LAB — HEPATIC FUNCTION PANEL
Alkaline Phosphatase: 65 U/L (ref 39–117)
Indirect Bilirubin: 0.8 mg/dL (ref 0.3–0.9)
Total Protein: 7.9 g/dL (ref 6.0–8.3)

## 2011-01-22 LAB — ETHANOL
Alcohol, Ethyl (B): <5 mg/dL (ref 0–10)
Alcohol, Ethyl (B): 159 mg/dL — ABNORMAL HIGH (ref 0–10)

## 2011-01-22 LAB — DIFFERENTIAL
Basophils Absolute: 0.1 10*3/uL (ref 0.0–0.1)
Basophils Relative: 1 % (ref 0–1)
Basophils Relative: 1 % (ref 0–1)
Eosinophils Absolute: 0.4 10*3/uL (ref 0.0–0.7)
Eosinophils Absolute: 0.1 10*3/uL (ref 0.0–0.7)
Eosinophils Relative: 7 % — ABNORMAL HIGH (ref 0–5)
Eosinophils Relative: 1 % (ref 0–5)
Eosinophils Relative: 2 % (ref 0–5)
Lymphocytes Relative: 33 % (ref 12–46)
Lymphocytes Relative: 19 % (ref 12–46)
Lymphs Abs: 1.4 10*3/uL (ref 0.7–4.0)
Lymphs Abs: 1.2 10*3/uL (ref 0.7–4.0)
Monocytes Relative: 12 % (ref 3–12)
Monocytes Relative: 12 % (ref 3–12)
Neutrophils Relative %: 52 % (ref 43–77)
Neutrophils Relative %: 49 % (ref 43–77)
Neutrophils Relative %: 69 % (ref 43–77)

## 2011-01-22 LAB — COMPREHENSIVE METABOLIC PANEL
ALT: 47 U/L (ref 0–53)
Albumin: 2.7 g/dL — ABNORMAL LOW (ref 3.5–5.2)
Albumin: 3.1 g/dL — ABNORMAL LOW (ref 3.5–5.2)
BUN: 4 mg/dL — ABNORMAL LOW (ref 6–23)
Calcium: 8.1 mg/dL — ABNORMAL LOW (ref 8.4–10.5)
Creatinine, Ser: 0.56 mg/dL (ref 0.4–1.5)
Glucose, Bld: 108 mg/dL — ABNORMAL HIGH (ref 70–99)
Potassium: 3.4 mEq/L — ABNORMAL LOW (ref 3.5–5.1)
Sodium: 135 mEq/L (ref 135–145)
Total Protein: 6.9 g/dL (ref 6.0–8.3)
Total Protein: 8 g/dL (ref 6.0–8.3)

## 2011-01-22 LAB — WOUND CULTURE

## 2011-01-22 LAB — TYPE AND SCREEN: Antibody Screen: NEGATIVE

## 2011-01-22 LAB — VANCOMYCIN, TROUGH: Vancomycin Tr: 11 ug/mL (ref 10.0–20.0)

## 2011-01-22 LAB — HEMOCCULT GUIAC POC 1CARD (OFFICE): Fecal Occult Bld: NEGATIVE

## 2011-01-22 LAB — BASIC METABOLIC PANEL
Calcium: 8.9 mg/dL (ref 8.4–10.5)
Creatinine, Ser: 0.59 mg/dL (ref 0.4–1.5)
GFR calc Af Amer: >60 mL/min (ref >60)

## 2011-01-22 LAB — HEMOGLOBIN AND HEMATOCRIT, BLOOD
HCT: 28 % — ABNORMAL LOW (ref 39.0–52.0)
Hemoglobin: 9.2 g/dL — ABNORMAL LOW (ref 13.0–17.0)
Hemoglobin: 9.5 g/dL — ABNORMAL LOW (ref 13.0–17.0)

## 2011-01-22 LAB — URINALYSIS, ROUTINE W REFLEX MICROSCOPIC
Bilirubin Urine: NEGATIVE
Glucose, UA: NEGATIVE mg/dL
Hgb urine dipstick: NEGATIVE
Ketones, ur: NEGATIVE mg/dL
pH: 5 (ref 5.0–8.0)

## 2011-01-22 LAB — TSH: TSH: 6.989 u[IU]/mL — ABNORMAL HIGH (ref 0.350–4.500)

## 2011-01-22 LAB — CK TOTAL AND CKMB (NOT AT ARMC): Relative Index: INVALID (ref 0.0–2.5)

## 2011-01-22 LAB — BRAIN NATRIURETIC PEPTIDE: Pro B Natriuretic peptide (BNP): 50 pg/mL (ref 0.0–100.0)

## 2011-01-22 LAB — LIPASE, BLOOD
Lipase: 57 U/L (ref 11–59)
Lipase: 49 U/L (ref 11–59)

## 2011-01-22 LAB — RAPID URINE DRUG SCREEN, HOSP PERFORMED
Benzodiazepines: POSITIVE — AB
Cocaine: NOT DETECTED

## 2011-01-23 LAB — DIFFERENTIAL
Band Neutrophils: 0 % (ref 0–10)
Basophils Absolute: 0.1 10*3/uL (ref 0.0–0.1)
Basophils Absolute: 0 10*3/uL (ref 0.0–0.1)
Basophils Absolute: 0 10*3/uL (ref 0.0–0.1)
Basophils Relative: 1 % (ref 0–1)
Basophils Relative: 0 % (ref 0–1)
Basophils Relative: 1 % (ref 0–1)
Basophils Relative: 1 % (ref 0–1)
Basophils Relative: 0 % (ref 0–1)
Blasts: 0 %
Eosinophils Absolute: 0.1 10*3/uL (ref 0.0–0.7)
Eosinophils Absolute: 0.1 10*3/uL (ref 0.0–0.7)
Eosinophils Absolute: 0.2 10*3/uL (ref 0.0–0.7)
Eosinophils Relative: 2 % (ref 0–5)
Eosinophils Relative: 3 % (ref 0–5)
Eosinophils Relative: 6 % — ABNORMAL HIGH (ref 0–5)
Eosinophils Relative: 3 % (ref 0–5)
Lymphocytes Relative: 34 % (ref 12–46)
Lymphocytes Relative: 38 % (ref 12–46)
Lymphocytes Relative: 40 % (ref 12–46)
Lymphocytes Relative: 41 % (ref 12–46)
Lymphs Abs: 1.4 10*3/uL (ref 0.7–4.0)
Lymphs Abs: 1.8 10*3/uL (ref 0.7–4.0)
Lymphs Abs: 1.8 10*3/uL (ref 0.7–4.0)
Monocytes Absolute: 0.8 10*3/uL (ref 0.1–1.0)
Monocytes Absolute: 0.4 10*3/uL (ref 0.1–1.0)
Monocytes Absolute: 0.4 10*3/uL (ref 0.1–1.0)
Monocytes Absolute: 0.6 10*3/uL (ref 0.1–1.0)
Monocytes Absolute: 0.7 10*3/uL (ref 0.1–1.0)
Monocytes Relative: 10 % (ref 3–12)
Monocytes Relative: 10 % (ref 3–12)
Myelocytes: 0 %
Neutro Abs: 1.2 10*3/uL — ABNORMAL LOW (ref 1.7–7.7)
Neutro Abs: 2.4 10*3/uL (ref 1.7–7.7)
Neutro Abs: 2.4 10*3/uL (ref 1.7–7.7)
Neutro Abs: 2 10*3/uL (ref 1.7–7.7)
Neutrophils Relative %: 49 % (ref 43–77)
Neutrophils Relative %: 46 % (ref 43–77)
Neutrophils Relative %: 47 % (ref 43–77)
Neutrophils Relative %: 52 % (ref 43–77)
Neutrophils Relative %: 50 % (ref 43–77)
Neutrophils Relative %: 42 % — ABNORMAL LOW (ref 43–77)
Promyelocytes Absolute: 0 %
Smear Review: DECREASED
nRBC: 0 /100 WBC

## 2011-01-23 LAB — URINALYSIS, ROUTINE W REFLEX MICROSCOPIC
Glucose, UA: NEGATIVE mg/dL
Ketones, ur: NEGATIVE mg/dL
Nitrite: NEGATIVE
Protein, ur: NEGATIVE mg/dL
Protein, ur: NEGATIVE mg/dL
Specific Gravity, Urine: 1.003 — ABNORMAL LOW (ref 1.005–1.030)
Specific Gravity, Urine: 1.002 — ABNORMAL LOW (ref 1.005–1.030)
Urobilinogen, UA: 1 mg/dL (ref 0.0–1.0)
Urobilinogen, UA: 0.2 mg/dL (ref 0.0–1.0)
pH: 5.5 (ref 5.0–8.0)
pH: 6 (ref 5.0–8.0)

## 2011-01-23 LAB — CBC
HCT: 30.9 % — ABNORMAL LOW (ref 39.0–52.0)
HCT: 26 % — ABNORMAL LOW (ref 39.0–52.0)
HCT: 27.5 % — ABNORMAL LOW (ref 39.0–52.0)
HCT: 34.8 % — ABNORMAL LOW (ref 39.0–52.0)
HCT: 29.8 % — ABNORMAL LOW (ref 39.0–52.0)
HCT: 30.5 % — ABNORMAL LOW (ref 39.0–52.0)
HCT: 28.1 % — ABNORMAL LOW (ref 39.0–52.0)
Hemoglobin: 10 g/dL — ABNORMAL LOW (ref 13.0–17.0)
Hemoglobin: 9.4 g/dL — ABNORMAL LOW (ref 13.0–17.0)
Hemoglobin: 9.8 g/dL — ABNORMAL LOW (ref 13.0–17.0)
Hemoglobin: 9.9 g/dL — ABNORMAL LOW (ref 13.0–17.0)
Hemoglobin: 8.9 g/dL — ABNORMAL LOW (ref 13.0–17.0)
MCHC: 31.5 g/dL (ref 30.0–36.0)
MCHC: 31.6 g/dL (ref 30.0–36.0)
MCHC: 32.8 g/dL (ref 30.0–36.0)
MCHC: 31.8 g/dL (ref 30.0–36.0)
MCV: 69.1 fL — ABNORMAL LOW (ref 78.0–100.0)
MCV: 69 fL — ABNORMAL LOW (ref 78.0–100.0)
MCV: 69.7 fL — ABNORMAL LOW (ref 78.0–100.0)
Platelets: 108 10*3/uL — ABNORMAL LOW (ref 150–400)
Platelets: 215 10*3/uL (ref 150–400)
Platelets: 78 10*3/uL — ABNORMAL LOW (ref 150–400)
Platelets: 85 10*3/uL — ABNORMAL LOW (ref 150–400)
Platelets: 98 10*3/uL — ABNORMAL LOW (ref 150–400)
Platelets: 82 10*3/uL — ABNORMAL LOW (ref 150–400)
Platelets: 91 10*3/uL — ABNORMAL LOW (ref 150–400)
RBC: 4.23 MIL/uL (ref 4.22–5.81)
RBC: 4.14 MIL/uL — ABNORMAL LOW (ref 4.22–5.81)
RBC: 4.04 MIL/uL — ABNORMAL LOW (ref 4.22–5.81)
RDW: 27.5 % — ABNORMAL HIGH (ref 11.5–15.5)
RDW: 19.7 % — ABNORMAL HIGH (ref 11.5–15.5)
RDW: 21.2 % — ABNORMAL HIGH (ref 11.5–15.5)
RDW: 21.3 % — ABNORMAL HIGH (ref 11.5–15.5)
RDW: 21.5 % — ABNORMAL HIGH (ref 11.5–15.5)
RDW: 27.8 % — ABNORMAL HIGH (ref 11.5–15.5)
RDW: 22.2 % — ABNORMAL HIGH (ref 11.5–15.5)
RDW: 27.2 % — ABNORMAL HIGH (ref 11.5–15.5)
WBC: 5.6 10*3/uL (ref 4.0–10.5)
WBC: 3.2 10*3/uL — ABNORMAL LOW (ref 4.0–10.5)
WBC: 4.7 10*3/uL (ref 4.0–10.5)
WBC: 4.8 10*3/uL (ref 4.0–10.5)
WBC: 6.3 10*3/uL (ref 4.0–10.5)

## 2011-01-23 LAB — CROSSMATCH
ABO/RH(D): O POS
ABO/RH(D): O POS
Antibody Screen: NEGATIVE
Antibody Screen: NEGATIVE

## 2011-01-23 LAB — COMPREHENSIVE METABOLIC PANEL
ALT: 25 U/L (ref 0–53)
ALT: 42 U/L (ref 0–53)
ALT: 32 U/L (ref 0–53)
ALT: 48 U/L (ref 0–53)
AST: 102 U/L — ABNORMAL HIGH (ref 0–37)
AST: 84 U/L — ABNORMAL HIGH (ref 0–37)
Albumin: 3 g/dL — ABNORMAL LOW (ref 3.5–5.2)
Albumin: 3.3 g/dL — ABNORMAL LOW (ref 3.5–5.2)
Albumin: 3.5 g/dL (ref 3.5–5.2)
Albumin: 2.7 g/dL — ABNORMAL LOW (ref 3.5–5.2)
Alkaline Phosphatase: 53 U/L (ref 39–117)
Alkaline Phosphatase: 54 U/L (ref 39–117)
Alkaline Phosphatase: 61 U/L (ref 39–117)
Alkaline Phosphatase: 67 U/L (ref 39–117)
Alkaline Phosphatase: 52 U/L (ref 39–117)
Alkaline Phosphatase: 69 U/L (ref 39–117)
BUN: 4 mg/dL — ABNORMAL LOW (ref 6–23)
BUN: 4 mg/dL — ABNORMAL LOW (ref 6–23)
BUN: 4 mg/dL — ABNORMAL LOW (ref 6–23)
BUN: 3 mg/dL — ABNORMAL LOW (ref 6–23)
CO2: 24 mEq/L (ref 19–32)
CO2: 25 mEq/L (ref 19–32)
Calcium: 8.5 mg/dL (ref 8.4–10.5)
Chloride: 109 mEq/L (ref 96–112)
Chloride: 106 mEq/L (ref 96–112)
Chloride: 110 mEq/L (ref 96–112)
Creatinine, Ser: 0.64 mg/dL (ref 0.4–1.5)
GFR calc Af Amer: >60 mL/min (ref >60)
GFR calc non Af Amer: >60 mL/min (ref >60)
GFR calc non Af Amer: >60 mL/min (ref >60)
Glucose, Bld: 95 mg/dL (ref 70–99)
Glucose, Bld: 95 mg/dL (ref 70–99)
Glucose, Bld: 101 mg/dL — ABNORMAL HIGH (ref 70–99)
Potassium: 3.5 mEq/L (ref 3.5–5.1)
Potassium: 3.4 mEq/L — ABNORMAL LOW (ref 3.5–5.1)
Potassium: 3.7 mEq/L (ref 3.5–5.1)
Potassium: 3.9 mEq/L (ref 3.5–5.1)
Potassium: 4 mEq/L (ref 3.5–5.1)
Potassium: 3.6 mEq/L (ref 3.5–5.1)
Sodium: 139 mEq/L (ref 135–145)
Sodium: 140 mEq/L (ref 135–145)
Sodium: 142 mEq/L (ref 135–145)
Sodium: 141 mEq/L (ref 135–145)
Sodium: 140 mEq/L (ref 135–145)
Total Bilirubin: 1.3 mg/dL — ABNORMAL HIGH (ref 0.3–1.2)
Total Protein: 7 g/dL (ref 6.0–8.3)
Total Protein: 7.7 g/dL (ref 6.0–8.3)
Total Protein: 8.1 g/dL (ref 6.0–8.3)
Total Protein: 6.6 g/dL (ref 6.0–8.3)

## 2011-01-23 LAB — TYPE AND SCREEN
ABO/RH(D): O POS
Antibody Screen: NEGATIVE

## 2011-01-23 LAB — BASIC METABOLIC PANEL
BUN: 3 mg/dL — ABNORMAL LOW (ref 6–23)
Chloride: 102 mEq/L (ref 96–112)
GFR calc non Af Amer: >60 mL/min (ref >60)
Glucose, Bld: 99 mg/dL (ref 70–99)
Potassium: 3.8 mEq/L (ref 3.5–5.1)

## 2011-01-23 LAB — GLUCOSE, CAPILLARY: Glucose-Capillary: 133 mg/dL — ABNORMAL HIGH (ref 70–99)

## 2011-01-23 LAB — LIPID PANEL
Cholesterol: 111 mg/dL (ref 0–200)
Triglycerides: 30 mg/dL (ref <150)

## 2011-01-23 LAB — RAPID URINE DRUG SCREEN, HOSP PERFORMED
Barbiturates: NOT DETECTED
Benzodiazepines: NOT DETECTED
Cocaine: NOT DETECTED
Opiates: NOT DETECTED
Tetrahydrocannabinol: NOT DETECTED

## 2011-01-23 LAB — ETHANOL
Alcohol, Ethyl (B): 192 mg/dL — ABNORMAL HIGH (ref 0–10)
Alcohol, Ethyl (B): 224 mg/dL — ABNORMAL HIGH (ref 0–10)
Alcohol, Ethyl (B): 247 mg/dL — ABNORMAL HIGH (ref 0–10)

## 2011-01-23 LAB — HEMOGLOBIN AND HEMATOCRIT, BLOOD
HCT: 27.5 % — ABNORMAL LOW (ref 39.0–52.0)
Hemoglobin: 8.1 g/dL — ABNORMAL LOW (ref 13.0–17.0)
Hemoglobin: 8.9 g/dL — ABNORMAL LOW (ref 13.0–17.0)

## 2011-01-23 LAB — PROTIME-INR
INR: 1.3 (ref 0.00–1.49)
INR: 1.3 (ref 0.00–1.49)
INR: 1.4 (ref 0.00–1.49)
Prothrombin Time: 17.1 seconds — ABNORMAL HIGH (ref 11.6–15.2)
Prothrombin Time: 18.2 s — ABNORMAL HIGH (ref 11.6–15.2)

## 2011-01-23 LAB — ABO/RH: ABO/RH(D): O POS

## 2011-01-23 LAB — POCT I-STAT, CHEM 8
BUN: 5 mg/dL — ABNORMAL LOW (ref 6–23)
Calcium, Ion: 1.15 mmol/L (ref 1.12–1.32)
Chloride: 103 meq/L (ref 96–112)
Creatinine, Ser: 0.8 mg/dL (ref 0.4–1.5)

## 2011-01-23 LAB — HEMOGLOBIN A1C: Mean Plasma Glucose: 108 mg/dL

## 2011-01-23 LAB — TSH: TSH: 5.543 u[IU]/mL — ABNORMAL HIGH (ref 0.350–4.500)

## 2011-01-24 LAB — DIFFERENTIAL
Basophils Absolute: 0 10*3/uL (ref 0.0–0.1)
Lymphocytes Relative: 35 % (ref 12–46)
Neutro Abs: 3.1 10*3/uL (ref 1.7–7.7)

## 2011-01-24 LAB — COMPREHENSIVE METABOLIC PANEL
Albumin: 3.3 g/dL — ABNORMAL LOW (ref 3.5–5.2)
BUN: 5 mg/dL — ABNORMAL LOW (ref 6–23)
CO2: 25 mEq/L (ref 19–32)
Chloride: 107 mEq/L (ref 96–112)
Creatinine, Ser: 0.6 mg/dL (ref 0.4–1.5)
GFR calc non Af Amer: >60 mL/min (ref >60)
Total Bilirubin: 1 mg/dL (ref 0.3–1.2)

## 2011-01-24 LAB — CBC
HCT: 28.7 % — ABNORMAL LOW (ref 39.0–52.0)
MCV: 78.1 fL (ref 78.0–100.0)
Platelets: 108 10*3/uL — ABNORMAL LOW (ref 150–400)
WBC: 5.9 10*3/uL (ref 4.0–10.5)

## 2011-01-24 LAB — LIPASE, BLOOD: Lipase: 49 U/L (ref 11–59)

## 2011-01-24 LAB — HEMOCCULT GUIAC POC 1CARD (OFFICE): Fecal Occult Bld: NEGATIVE

## 2011-01-25 LAB — DIFFERENTIAL
Basophils Absolute: 0.1 10*3/uL (ref 0.0–0.1)
Basophils Absolute: 0.1 10*3/uL (ref 0.0–0.1)
Basophils Relative: 1 % (ref 0–1)
Basophils Relative: 1 % (ref 0–1)
Eosinophils Absolute: 0.3 10*3/uL (ref 0.0–0.7)
Eosinophils Absolute: 0.4 10*3/uL (ref 0.0–0.7)
Eosinophils Relative: 6 % — ABNORMAL HIGH (ref 0–5)
Lymphocytes Relative: 42 % (ref 12–46)
Lymphocytes Relative: 39 % (ref 12–46)
Lymphocytes Relative: 42 % (ref 12–46)
Lymphs Abs: 2.2 10*3/uL (ref 0.7–4.0)
Lymphs Abs: 1.6 10*3/uL (ref 0.7–4.0)
Monocytes Absolute: 0.7 10*3/uL (ref 0.1–1.0)
Monocytes Absolute: 0.5 10*3/uL (ref 0.1–1.0)
Monocytes Relative: 13 % — ABNORMAL HIGH (ref 3–12)
Monocytes Relative: 11 % (ref 3–12)
Neutro Abs: 2 10*3/uL (ref 1.7–7.7)
Neutro Abs: 1.7 10*3/uL (ref 1.7–7.7)
Neutro Abs: 2.2 10*3/uL (ref 1.7–7.7)
Neutrophils Relative %: 39 % — ABNORMAL LOW (ref 43–77)
Neutrophils Relative %: 41 % — ABNORMAL LOW (ref 43–77)
Neutrophils Relative %: 43 % (ref 43–77)

## 2011-01-25 LAB — COMPREHENSIVE METABOLIC PANEL
ALT: 24 U/L (ref 0–53)
AST: 47 U/L — ABNORMAL HIGH (ref 0–37)
Albumin: 3.5 g/dL (ref 3.5–5.2)
Albumin: 3.2 g/dL — ABNORMAL LOW (ref 3.5–5.2)
Alkaline Phosphatase: 65 U/L (ref 39–117)
Alkaline Phosphatase: 70 U/L (ref 39–117)
BUN: 2 mg/dL — ABNORMAL LOW (ref 6–23)
BUN: 2 mg/dL — ABNORMAL LOW (ref 6–23)
CO2: 31 mEq/L (ref 19–32)
Calcium: 9.3 mg/dL (ref 8.4–10.5)
Chloride: 105 mEq/L (ref 96–112)
Chloride: 110 mEq/L (ref 96–112)
Creatinine, Ser: 0.58 mg/dL (ref 0.4–1.5)
Creatinine, Ser: 0.57 mg/dL (ref 0.4–1.5)
GFR calc Af Amer: >60 mL/min (ref >60)
GFR calc non Af Amer: >60 mL/min (ref >60)
Glucose, Bld: 104 mg/dL — ABNORMAL HIGH (ref 70–99)
Glucose, Bld: 96 mg/dL (ref 70–99)
Potassium: 3.8 mEq/L (ref 3.5–5.1)
Potassium: 3.6 mEq/L (ref 3.5–5.1)
Sodium: 143 mEq/L (ref 135–145)
Total Bilirubin: 1.8 mg/dL — ABNORMAL HIGH (ref 0.3–1.2)
Total Bilirubin: 1.2 mg/dL (ref 0.3–1.2)
Total Protein: 8.1 g/dL (ref 6.0–8.3)
Total Protein: 8 g/dL (ref 6.0–8.3)

## 2011-01-25 LAB — URINALYSIS, MICROSCOPIC ONLY
Bilirubin Urine: NEGATIVE
Glucose, UA: NEGATIVE mg/dL
Hgb urine dipstick: NEGATIVE
Protein, ur: NEGATIVE mg/dL
Specific Gravity, Urine: 1.013 (ref 1.005–1.030)

## 2011-01-25 LAB — CBC
HCT: 33.1 % — ABNORMAL LOW (ref 39.0–52.0)
HCT: 27.9 % — ABNORMAL LOW (ref 39.0–52.0)
Hemoglobin: 10.8 g/dL — ABNORMAL LOW (ref 13.0–17.0)
Hemoglobin: 9.2 g/dL — ABNORMAL LOW (ref 13.0–17.0)
MCHC: 32.7 g/dL (ref 30.0–36.0)
MCV: 81.1 fL (ref 78.0–100.0)
MCV: 81 fL (ref 78.0–100.0)
MCV: 81.5 fL (ref 78.0–100.0)
Platelets: 123 10*3/uL — ABNORMAL LOW (ref 150–400)
Platelets: 126 10*3/uL — ABNORMAL LOW (ref 150–400)
Platelets: 160 10*3/uL (ref 150–400)
RBC: 4.08 MIL/uL — ABNORMAL LOW (ref 4.22–5.81)
RBC: 2.89 MIL/uL — ABNORMAL LOW (ref 4.22–5.81)
RDW: 18.2 % — ABNORMAL HIGH (ref 11.5–15.5)
RDW: 18.8 % — ABNORMAL HIGH (ref 11.5–15.5)
WBC: 5.2 10*3/uL (ref 4.0–10.5)
WBC: 4.1 10*3/uL (ref 4.0–10.5)

## 2011-01-25 LAB — MAGNESIUM: Magnesium: 1.8 mg/dL (ref 1.5–2.5)

## 2011-01-25 LAB — BASIC METABOLIC PANEL
BUN: 3 mg/dL — ABNORMAL LOW (ref 6–23)
Calcium: 7.7 mg/dL — ABNORMAL LOW (ref 8.4–10.5)
Creatinine, Ser: 0.59 mg/dL (ref 0.4–1.5)
GFR calc Af Amer: >60 mL/min (ref >60)
GFR calc non Af Amer: >60 mL/min (ref >60)

## 2011-01-25 LAB — CROSSMATCH

## 2011-01-25 LAB — RAPID URINE DRUG SCREEN, HOSP PERFORMED
Amphetamines: NOT DETECTED
Barbiturates: NOT DETECTED
Benzodiazepines: NOT DETECTED
Cocaine: NOT DETECTED
Opiates: NOT DETECTED
Tetrahydrocannabinol: NOT DETECTED

## 2011-01-25 LAB — HEMOGLOBIN AND HEMATOCRIT, BLOOD
HCT: 28.5 % — ABNORMAL LOW (ref 39.0–52.0)
HCT: 29.5 % — ABNORMAL LOW (ref 39.0–52.0)
Hemoglobin: 10.1 g/dL — ABNORMAL LOW (ref 13.0–17.0)
Hemoglobin: 9.6 g/dL — ABNORMAL LOW (ref 13.0–17.0)

## 2011-01-25 LAB — PROTIME-INR: Prothrombin Time: 16.5 seconds — ABNORMAL HIGH (ref 11.6–15.2)

## 2011-01-25 LAB — ETHANOL: Alcohol, Ethyl (B): 291 mg/dL — ABNORMAL HIGH (ref 0–10)

## 2011-01-25 LAB — APTT: aPTT: 32 seconds (ref 24–37)

## 2011-01-25 LAB — GASTRIC OCCULT BLOOD (1-CARD TO LAB)
Occult Blood, Gastric: POSITIVE — AB
pH, Gastric: 6

## 2011-01-25 LAB — PHOSPHORUS: Phosphorus: 3.5 mg/dL (ref 2.3–4.6)

## 2011-01-26 LAB — CBC
HCT: 18.5 % — ABNORMAL LOW (ref 39.0–52.0)
HCT: 25.5 % — ABNORMAL LOW (ref 39.0–52.0)
HCT: 27.9 % — ABNORMAL LOW (ref 39.0–52.0)
HCT: 25.7 % — ABNORMAL LOW (ref 39.0–52.0)
HCT: 25.8 % — ABNORMAL LOW (ref 39.0–52.0)
HCT: 28.8 % — ABNORMAL LOW (ref 39.0–52.0)
HCT: 29.6 % — ABNORMAL LOW (ref 39.0–52.0)
Hemoglobin: 6.4 g/dL — CL (ref 13.0–17.0)
Hemoglobin: 8.8 g/dL — ABNORMAL LOW (ref 13.0–17.0)
Hemoglobin: 9.3 g/dL — ABNORMAL LOW (ref 13.0–17.0)
Hemoglobin: 9.3 g/dL — ABNORMAL LOW (ref 13.0–17.0)
Hemoglobin: 10.1 g/dL — ABNORMAL LOW (ref 13.0–17.0)
Hemoglobin: 8.3 g/dL — ABNORMAL LOW (ref 13.0–17.0)
Hemoglobin: 9.8 g/dL — ABNORMAL LOW (ref 13.0–17.0)
MCHC: 33.3 g/dL (ref 30.0–36.0)
MCHC: 33.5 g/dL (ref 30.0–36.0)
MCHC: 34 g/dL (ref 30.0–36.0)
MCHC: 32.7 g/dL (ref 30.0–36.0)
MCV: 78.4 fL (ref 78.0–100.0)
MCV: 81.6 fL (ref 78.0–100.0)
MCV: 77.6 fL — ABNORMAL LOW (ref 78.0–100.0)
MCV: 78.2 fL (ref 78.0–100.0)
MCV: 78.4 fL (ref 78.0–100.0)
Platelets: 101 10*3/uL — ABNORMAL LOW (ref 150–400)
Platelets: 134 10*3/uL — ABNORMAL LOW (ref 150–400)
Platelets: 251 10*3/uL (ref 150–400)
Platelets: 38 10*3/uL — CL (ref 150–400)
Platelets: 38 10*3/uL — CL (ref 150–400)
Platelets: 127 10*3/uL — ABNORMAL LOW (ref 150–400)
Platelets: 185 10*3/uL (ref 150–400)
Platelets: 42 10*3/uL — CL (ref 150–400)
Platelets: 80 10*3/uL — ABNORMAL LOW (ref 150–400)
RBC: 3.36 MIL/uL — ABNORMAL LOW (ref 4.22–5.81)
RBC: 3.37 MIL/uL — ABNORMAL LOW (ref 4.22–5.81)
RBC: 3.29 MIL/uL — ABNORMAL LOW (ref 4.22–5.81)
RBC: 3.83 MIL/uL — ABNORMAL LOW (ref 4.22–5.81)
RBC: 3.95 MIL/uL — ABNORMAL LOW (ref 4.22–5.81)
RDW: 19.8 % — ABNORMAL HIGH (ref 11.5–15.5)
RDW: 20.3 % — ABNORMAL HIGH (ref 11.5–15.5)
RDW: 20.3 % — ABNORMAL HIGH (ref 11.5–15.5)
RDW: 21 % — ABNORMAL HIGH (ref 11.5–15.5)
RDW: 21.2 % — ABNORMAL HIGH (ref 11.5–15.5)
RDW: 21.3 % — ABNORMAL HIGH (ref 11.5–15.5)
RDW: 21.2 % — ABNORMAL HIGH (ref 11.5–15.5)
RDW: 22.9 % — ABNORMAL HIGH (ref 11.5–15.5)
RDW: 23 % — ABNORMAL HIGH (ref 11.5–15.5)
WBC: 3.7 10*3/uL — ABNORMAL LOW (ref 4.0–10.5)
WBC: 4.1 10*3/uL (ref 4.0–10.5)
WBC: 4.3 10*3/uL (ref 4.0–10.5)
WBC: 5.7 10*3/uL (ref 4.0–10.5)
WBC: 8.8 10*3/uL (ref 4.0–10.5)
WBC: 4.7 10*3/uL (ref 4.0–10.5)
WBC: 5.5 10*3/uL (ref 4.0–10.5)
WBC: 6 10*3/uL (ref 4.0–10.5)

## 2011-01-26 LAB — DIFFERENTIAL
Basophils Absolute: 0.1 10*3/uL (ref 0.0–0.1)
Basophils Absolute: 0.1 10*3/uL (ref 0.0–0.1)
Basophils Absolute: 0.2 10*3/uL — ABNORMAL HIGH (ref 0.0–0.1)
Basophils Relative: 1 % (ref 0–1)
Basophils Relative: 4 % — ABNORMAL HIGH (ref 0–1)
Eosinophils Absolute: 0.1 10*3/uL (ref 0.0–0.7)
Eosinophils Absolute: 0.4 10*3/uL (ref 0.0–0.7)
Eosinophils Relative: 2 % (ref 0–5)
Eosinophils Relative: 5 % (ref 0–5)
Eosinophils Relative: 7 % — ABNORMAL HIGH (ref 0–5)
Lymphocytes Relative: 34 % (ref 12–46)
Lymphocytes Relative: 43 % (ref 12–46)
Lymphocytes Relative: 49 % — ABNORMAL HIGH (ref 12–46)
Lymphs Abs: 2.3 10*3/uL (ref 0.7–4.0)
Lymphs Abs: 1.8 10*3/uL (ref 0.7–4.0)
Lymphs Abs: 2.7 10*3/uL (ref 0.7–4.0)
Monocytes Absolute: 1 10*3/uL (ref 0.1–1.0)
Monocytes Absolute: 0.4 10*3/uL (ref 0.1–1.0)
Monocytes Absolute: 0.6 10*3/uL (ref 0.1–1.0)
Monocytes Relative: 15 % — ABNORMAL HIGH (ref 3–12)
Monocytes Relative: 11 % (ref 3–12)
Monocytes Relative: 8 % (ref 3–12)
Neutro Abs: 3.4 10*3/uL (ref 1.7–7.7)
Neutrophils Relative %: 36 % — ABNORMAL LOW (ref 43–77)
Neutrophils Relative %: 36 % — ABNORMAL LOW (ref 43–77)

## 2011-01-26 LAB — GLUCOSE, CAPILLARY
Glucose-Capillary: 101 mg/dL — ABNORMAL HIGH (ref 70–99)
Glucose-Capillary: 90 mg/dL (ref 70–99)
Glucose-Capillary: 95 mg/dL (ref 70–99)
Glucose-Capillary: 105 mg/dL — ABNORMAL HIGH (ref 70–99)
Glucose-Capillary: 79 mg/dL (ref 70–99)

## 2011-01-26 LAB — COMPREHENSIVE METABOLIC PANEL
ALT: 52 U/L (ref 0–53)
Albumin: 2.7 g/dL — ABNORMAL LOW (ref 3.5–5.2)
Albumin: 3.1 g/dL — ABNORMAL LOW (ref 3.5–5.2)
Albumin: 2.9 g/dL — ABNORMAL LOW (ref 3.5–5.2)
Alkaline Phosphatase: 68 U/L (ref 39–117)
Alkaline Phosphatase: 72 U/L (ref 39–117)
Alkaline Phosphatase: 81 U/L (ref 39–117)
BUN: 2 mg/dL — ABNORMAL LOW (ref 6–23)
BUN: 4 mg/dL — ABNORMAL LOW (ref 6–23)
BUN: 5 mg/dL — ABNORMAL LOW (ref 6–23)
CO2: 24 mEq/L (ref 19–32)
CO2: 25 mEq/L (ref 19–32)
CO2: 26 mEq/L (ref 19–32)
Calcium: 8.2 mg/dL — ABNORMAL LOW (ref 8.4–10.5)
Calcium: 8.7 mg/dL (ref 8.4–10.5)
Chloride: 104 mEq/L (ref 96–112)
Chloride: 108 mEq/L (ref 96–112)
Creatinine, Ser: 0.69 mg/dL (ref 0.4–1.5)
Creatinine, Ser: 0.58 mg/dL (ref 0.4–1.5)
Creatinine, Ser: 0.64 mg/dL (ref 0.4–1.5)
GFR calc Af Amer: >60 mL/min (ref >60)
GFR calc non Af Amer: >60 mL/min (ref >60)
GFR calc non Af Amer: >60 mL/min (ref >60)
GFR calc non Af Amer: >60 mL/min (ref >60)
Glucose, Bld: 100 mg/dL — ABNORMAL HIGH (ref 70–99)
Glucose, Bld: 105 mg/dL — ABNORMAL HIGH (ref 70–99)
Glucose, Bld: 100 mg/dL — ABNORMAL HIGH (ref 70–99)
Glucose, Bld: 91 mg/dL (ref 70–99)
Potassium: 3.3 mEq/L — ABNORMAL LOW (ref 3.5–5.1)
Potassium: 3.5 mEq/L (ref 3.5–5.1)
Potassium: 3.5 mEq/L (ref 3.5–5.1)
Sodium: 133 mEq/L — ABNORMAL LOW (ref 135–145)
Sodium: 139 mEq/L (ref 135–145)
Total Bilirubin: 2.4 mg/dL — ABNORMAL HIGH (ref 0.3–1.2)
Total Bilirubin: 1.3 mg/dL — ABNORMAL HIGH (ref 0.3–1.2)
Total Protein: 6.5 g/dL (ref 6.0–8.3)
Total Protein: 7.6 g/dL (ref 6.0–8.3)
Total Protein: 8.9 g/dL — ABNORMAL HIGH (ref 6.0–8.3)

## 2011-01-26 LAB — TYPE AND SCREEN
ABO/RH(D): O POS
ABO/RH(D): O POS
Antibody Screen: NEGATIVE

## 2011-01-26 LAB — BASIC METABOLIC PANEL
BUN: 3 mg/dL — ABNORMAL LOW (ref 6–23)
CO2: 25 mEq/L (ref 19–32)
Chloride: 110 mEq/L (ref 96–112)
Creatinine, Ser: 0.65 mg/dL (ref 0.4–1.5)
GFR calc non Af Amer: >60 mL/min (ref >60)
Glucose, Bld: 97 mg/dL (ref 70–99)
Glucose, Bld: 109 mg/dL — ABNORMAL HIGH (ref 70–99)
Potassium: 4 mEq/L (ref 3.5–5.1)
Sodium: 135 mEq/L (ref 135–145)

## 2011-01-26 LAB — CULTURE, BLOOD (ROUTINE X 2)

## 2011-01-26 LAB — MAGNESIUM: Magnesium: 1.9 mg/dL (ref 1.5–2.5)

## 2011-01-26 LAB — CROSSMATCH
Antibody Screen: NEGATIVE
Antibody Screen: NEGATIVE

## 2011-01-26 LAB — RAPID URINE DRUG SCREEN, HOSP PERFORMED
Amphetamines: NOT DETECTED
Amphetamines: NOT DETECTED
Barbiturates: NOT DETECTED
Barbiturates: NOT DETECTED
Barbiturates: NOT DETECTED
Barbiturates: NOT DETECTED
Benzodiazepines: POSITIVE — AB
Cocaine: NOT DETECTED
Cocaine: NOT DETECTED
Opiates: NOT DETECTED
Opiates: NOT DETECTED
Opiates: NOT DETECTED
Opiates: NOT DETECTED

## 2011-01-26 LAB — ETHANOL
Alcohol, Ethyl (B): 217 mg/dL — ABNORMAL HIGH (ref 0–10)
Alcohol, Ethyl (B): 203 mg/dL — ABNORMAL HIGH (ref 0–10)
Alcohol, Ethyl (B): 256 mg/dL — ABNORMAL HIGH (ref 0–10)
Alcohol, Ethyl (B): 266 mg/dL — ABNORMAL HIGH (ref 0–10)
Alcohol, Ethyl (B): 286 mg/dL — ABNORMAL HIGH (ref 0–10)
Alcohol, Ethyl (B): 421 mg/dL (ref 0–10)
Alcohol, Ethyl (B): 431 mg/dL (ref 0–10)

## 2011-01-26 LAB — URINE MICROSCOPIC-ADD ON

## 2011-01-26 LAB — POCT I-STAT, CHEM 8
BUN: <3 mg/dL — ABNORMAL LOW (ref 6–23)
Calcium, Ion: 1.09 mmol/L — ABNORMAL LOW (ref 1.12–1.32)
Creatinine, Ser: 0.7 mg/dL (ref 0.4–1.5)
TCO2: 26 mmol/L (ref 0–100)

## 2011-01-26 LAB — HEPATIC FUNCTION PANEL
ALT: 46 U/L (ref 0–53)
ALT: 139 U/L — ABNORMAL HIGH (ref 0–53)
AST: 86 U/L — ABNORMAL HIGH (ref 0–37)
AST: 229 U/L — ABNORMAL HIGH (ref 0–37)
Albumin: 2.6 g/dL — ABNORMAL LOW (ref 3.5–5.2)
Indirect Bilirubin: 0.8 mg/dL (ref 0.3–0.9)
Total Bilirubin: 2.3 mg/dL — ABNORMAL HIGH (ref 0.3–1.2)
Total Protein: 8.3 g/dL (ref 6.0–8.3)

## 2011-01-26 LAB — RETICULOCYTES
RBC.: 3.78 MIL/uL — ABNORMAL LOW (ref 4.22–5.81)
Retic Ct Pct: 2.2 % (ref 0.4–3.1)

## 2011-01-26 LAB — URINALYSIS, ROUTINE W REFLEX MICROSCOPIC
Bilirubin Urine: NEGATIVE
Bilirubin Urine: NEGATIVE
Glucose, UA: NEGATIVE mg/dL
Glucose, UA: NEGATIVE mg/dL
Hgb urine dipstick: NEGATIVE
Specific Gravity, Urine: 1.008 (ref 1.005–1.030)
Specific Gravity, Urine: 1.008 (ref 1.005–1.030)
Urobilinogen, UA: 0.2 mg/dL (ref 0.0–1.0)
Urobilinogen, UA: 0.2 mg/dL (ref 0.0–1.0)
pH: 5.5 (ref 5.0–8.0)

## 2011-01-26 LAB — ACETAMINOPHEN LEVEL: Acetaminophen (Tylenol), Serum: <10 ug/mL — ABNORMAL LOW (ref 10–30)

## 2011-01-26 LAB — URINE CULTURE: Colony Count: NO GROWTH

## 2011-01-26 LAB — APTT: aPTT: 34 seconds (ref 24–37)

## 2011-01-26 LAB — PROTIME-INR
INR: 1.2 (ref 0.00–1.49)
Prothrombin Time: 15.6 seconds — ABNORMAL HIGH (ref 11.6–15.2)
Prothrombin Time: 16.7 seconds — ABNORMAL HIGH (ref 11.6–15.2)
Prothrombin Time: 15.3 seconds — ABNORMAL HIGH (ref 11.6–15.2)

## 2011-01-26 LAB — LIPASE, BLOOD: Lipase: 47 U/L (ref 11–59)

## 2011-01-26 LAB — FOLATE: Folate: >20 ng/mL

## 2011-01-26 LAB — VITAMIN B12: Vitamin B-12: 586 pg/mL (ref 211–911)

## 2011-01-26 LAB — SALICYLATE LEVEL: Salicylate Lvl: <4 mg/dL (ref 2.8–20.0)

## 2011-01-26 LAB — AMYLASE: Amylase: 100 U/L (ref 27–131)

## 2011-01-26 LAB — LACTIC ACID, PLASMA: Lactic Acid, Venous: 2.6 mmol/L — ABNORMAL HIGH (ref 0.5–2.2)

## 2011-01-27 LAB — CBC
HCT: 23.5 % — ABNORMAL LOW (ref 39.0–52.0)
Hemoglobin: 10.9 g/dL — ABNORMAL LOW (ref 13.0–17.0)
Hemoglobin: 7.7 g/dL — CL (ref 13.0–17.0)
Hemoglobin: 10.6 g/dL — ABNORMAL LOW (ref 13.0–17.0)
Hemoglobin: 8.2 g/dL — ABNORMAL LOW (ref 13.0–17.0)
MCHC: 32.7 g/dL (ref 30.0–36.0)
MCHC: 32.5 g/dL (ref 30.0–36.0)
MCHC: 32.7 g/dL (ref 30.0–36.0)
MCHC: 31.7 g/dL (ref 30.0–36.0)
MCHC: 32.6 g/dL (ref 30.0–36.0)
MCV: 81.5 fL (ref 78.0–100.0)
MCV: 71.2 fL — ABNORMAL LOW (ref 78.0–100.0)
MCV: 71.5 fL — ABNORMAL LOW (ref 78.0–100.0)
Platelets: 110 10*3/uL — ABNORMAL LOW (ref 150–400)
Platelets: 167 10*3/uL (ref 150–400)
Platelets: 140 10*3/uL — ABNORMAL LOW (ref 150–400)
Platelets: 87 10*3/uL — ABNORMAL LOW (ref 150–400)
RBC: 4.63 MIL/uL (ref 4.22–5.81)
RBC: 3.89 MIL/uL — ABNORMAL LOW (ref 4.22–5.81)
RBC: 3.86 MIL/uL — ABNORMAL LOW (ref 4.22–5.81)
RBC: 4.34 MIL/uL (ref 4.22–5.81)
RDW: 25.1 % — ABNORMAL HIGH (ref 11.5–15.5)
RDW: 21.6 % — ABNORMAL HIGH (ref 11.5–15.5)
RDW: 22.5 % — ABNORMAL HIGH (ref 11.5–15.5)
RDW: 22.7 % — ABNORMAL HIGH (ref 11.5–15.5)
WBC: 5.6 10*3/uL (ref 4.0–10.5)
WBC: 4.5 10*3/uL (ref 4.0–10.5)
WBC: 8.1 10*3/uL (ref 4.0–10.5)
WBC: 6.2 10*3/uL (ref 4.0–10.5)
WBC: 6.8 10*3/uL (ref 4.0–10.5)

## 2011-01-27 LAB — COMPREHENSIVE METABOLIC PANEL
ALT: 44 U/L (ref 0–53)
ALT: 28 U/L (ref 0–53)
ALT: 42 U/L (ref 0–53)
AST: 85 U/L — ABNORMAL HIGH (ref 0–37)
AST: 57 U/L — ABNORMAL HIGH (ref 0–37)
AST: 100 U/L — ABNORMAL HIGH (ref 0–37)
AST: 47 U/L — ABNORMAL HIGH (ref 0–37)
AST: 67 U/L — ABNORMAL HIGH (ref 0–37)
Albumin: 2.8 g/dL — ABNORMAL LOW (ref 3.5–5.2)
Albumin: 2.8 g/dL — ABNORMAL LOW (ref 3.5–5.2)
Albumin: 3.5 g/dL (ref 3.5–5.2)
Alkaline Phosphatase: 71 U/L (ref 39–117)
Alkaline Phosphatase: 65 U/L (ref 39–117)
Alkaline Phosphatase: 66 U/L (ref 39–117)
BUN: 4 mg/dL — ABNORMAL LOW (ref 6–23)
CO2: 29 mEq/L (ref 19–32)
CO2: 27 mEq/L (ref 19–32)
Calcium: 8.4 mg/dL (ref 8.4–10.5)
Calcium: 8.8 mg/dL (ref 8.4–10.5)
Chloride: 110 mEq/L (ref 96–112)
Chloride: 107 mEq/L (ref 96–112)
Chloride: 108 mEq/L (ref 96–112)
Creatinine, Ser: 0.54 mg/dL (ref 0.4–1.5)
Creatinine, Ser: 0.72 mg/dL (ref 0.4–1.5)
Creatinine, Ser: 0.59 mg/dL (ref 0.4–1.5)
GFR calc Af Amer: >60 mL/min (ref >60)
GFR calc Af Amer: >60 mL/min (ref >60)
GFR calc Af Amer: >60 mL/min (ref >60)
GFR calc Af Amer: >60 mL/min (ref >60)
GFR calc non Af Amer: >60 mL/min (ref >60)
GFR calc non Af Amer: >60 mL/min (ref >60)
Glucose, Bld: 91 mg/dL (ref 70–99)
Glucose, Bld: 99 mg/dL (ref 70–99)
Glucose, Bld: 102 mg/dL — ABNORMAL HIGH (ref 70–99)
Glucose, Bld: 93 mg/dL (ref 70–99)
Potassium: 3.9 mEq/L (ref 3.5–5.1)
Potassium: 3.3 mEq/L — ABNORMAL LOW (ref 3.5–5.1)
Potassium: 3.6 mEq/L (ref 3.5–5.1)
Potassium: 3.8 mEq/L (ref 3.5–5.1)
Sodium: 144 mEq/L (ref 135–145)
Sodium: 134 mEq/L — ABNORMAL LOW (ref 135–145)
Sodium: 142 mEq/L (ref 135–145)
Total Bilirubin: 1 mg/dL (ref 0.3–1.2)
Total Bilirubin: 1.7 mg/dL — ABNORMAL HIGH (ref 0.3–1.2)
Total Protein: 6.7 g/dL (ref 6.0–8.3)
Total Protein: 6.5 g/dL (ref 6.0–8.3)
Total Protein: 6.5 g/dL (ref 6.0–8.3)
Total Protein: 8.3 g/dL (ref 6.0–8.3)

## 2011-01-27 LAB — HEPATIC FUNCTION PANEL
Albumin: 3 g/dL — ABNORMAL LOW (ref 3.5–5.2)
Alkaline Phosphatase: 65 U/L (ref 39–117)
Bilirubin, Direct: 0.3 mg/dL (ref 0.0–0.3)
Total Bilirubin: 1.4 mg/dL — ABNORMAL HIGH (ref 0.3–1.2)
Total Protein: 7 g/dL (ref 6.0–8.3)

## 2011-01-27 LAB — DIFFERENTIAL
Basophils Relative: 1 % (ref 0–1)
Basophils Relative: 1 % (ref 0–1)
Basophils Relative: 1 % (ref 0–1)
Eosinophils Absolute: 0.2 10*3/uL (ref 0.0–0.7)
Eosinophils Absolute: 0.1 10*3/uL (ref 0.0–0.7)
Eosinophils Absolute: 0.2 10*3/uL (ref 0.0–0.7)
Eosinophils Relative: 3 % (ref 0–5)
Eosinophils Relative: 5 % (ref 0–5)
Eosinophils Relative: 2 % (ref 0–5)
Eosinophils Relative: 3 % (ref 0–5)
Lymphocytes Relative: 30 % (ref 12–46)
Lymphocytes Relative: 42 % (ref 12–46)
Lymphs Abs: 1.7 10*3/uL (ref 0.7–4.0)
Lymphs Abs: 1.4 10*3/uL (ref 0.7–4.0)
Lymphs Abs: 3.2 10*3/uL (ref 0.7–4.0)
Monocytes Absolute: 0.7 10*3/uL (ref 0.1–1.0)
Monocytes Absolute: 0.7 10*3/uL (ref 0.1–1.0)
Monocytes Relative: 13 % — ABNORMAL HIGH (ref 3–12)
Monocytes Relative: 14 % — ABNORMAL HIGH (ref 3–12)
Monocytes Relative: 16 % — ABNORMAL HIGH (ref 3–12)
Neutro Abs: 3.5 10*3/uL (ref 1.7–7.7)
Neutro Abs: 3 10*3/uL (ref 1.7–7.7)
Neutrophils Relative %: 44 % (ref 43–77)
Neutrophils Relative %: 57 % (ref 43–77)
Neutrophils Relative %: 33 % — ABNORMAL LOW (ref 43–77)
Neutrophils Relative %: 44 % (ref 43–77)

## 2011-01-27 LAB — RAPID URINE DRUG SCREEN, HOSP PERFORMED
Amphetamines: NOT DETECTED
Amphetamines: NOT DETECTED
Barbiturates: NOT DETECTED
Benzodiazepines: POSITIVE — AB
Benzodiazepines: POSITIVE — AB
Cocaine: NOT DETECTED
Cocaine: NOT DETECTED
Opiates: POSITIVE — AB
Tetrahydrocannabinol: NOT DETECTED

## 2011-01-27 LAB — BASIC METABOLIC PANEL
BUN: 3 mg/dL — ABNORMAL LOW (ref 6–23)
CO2: 27 mEq/L (ref 19–32)
Calcium: 7.8 mg/dL — ABNORMAL LOW (ref 8.4–10.5)
Chloride: 108 mEq/L (ref 96–112)
Creatinine, Ser: 0.65 mg/dL (ref 0.4–1.5)
GFR calc Af Amer: >60 mL/min (ref >60)
Glucose, Bld: 101 mg/dL — ABNORMAL HIGH (ref 70–99)

## 2011-01-27 LAB — POCT I-STAT, CHEM 8
Calcium, Ion: 1.05 mmol/L — ABNORMAL LOW (ref 1.12–1.32)
Chloride: 109 meq/L (ref 96–112)
Creatinine, Ser: 1 mg/dL (ref 0.4–1.5)
Glucose, Bld: 139 mg/dL — ABNORMAL HIGH (ref 70–99)
HCT: 34 % — ABNORMAL LOW (ref 39.0–52.0)

## 2011-01-27 LAB — LIPID PANEL
LDL Cholesterol: 77 mg/dL (ref 0–99)
Triglycerides: 41 mg/dL (ref <150)

## 2011-01-27 LAB — HEPATITIS B SURFACE ANTIGEN: Hepatitis B Surface Ag: NEGATIVE

## 2011-01-27 LAB — HEMOCCULT GUIAC POC 1CARD (OFFICE)
Fecal Occult Bld: NEGATIVE
Fecal Occult Bld: NEGATIVE

## 2011-01-27 LAB — TYPE AND SCREEN
ABO/RH(D): O POS
Antibody Screen: NEGATIVE

## 2011-01-27 LAB — POCT CARDIAC MARKERS
CKMB, poc: 1.5 ng/mL (ref 1.0–8.0)
Myoglobin, poc: 142 ng/mL (ref 12–200)
Troponin i, poc: <0.05 ng/mL (ref 0.00–0.09)

## 2011-01-27 LAB — CROSSMATCH
ABO/RH(D): O POS
Antibody Screen: NEGATIVE

## 2011-01-27 LAB — ETHANOL
Alcohol, Ethyl (B): 161 mg/dL — ABNORMAL HIGH (ref 0–10)
Alcohol, Ethyl (B): 302 mg/dL — ABNORMAL HIGH (ref 0–10)
Alcohol, Ethyl (B): 234 mg/dL — ABNORMAL HIGH (ref 0–10)
Alcohol, Ethyl (B): 292 mg/dL — ABNORMAL HIGH (ref 0–10)

## 2011-01-27 LAB — URINALYSIS, ROUTINE W REFLEX MICROSCOPIC
Bilirubin Urine: NEGATIVE
Glucose, UA: NEGATIVE mg/dL
Glucose, UA: NEGATIVE mg/dL
Hgb urine dipstick: NEGATIVE
Hgb urine dipstick: NEGATIVE
Ketones, ur: NEGATIVE mg/dL
Nitrite: NEGATIVE
Protein, ur: NEGATIVE mg/dL
Specific Gravity, Urine: 1 — ABNORMAL LOW (ref 1.005–1.030)
Specific Gravity, Urine: 1.014 (ref 1.005–1.030)
Urobilinogen, UA: 0.2 mg/dL (ref 0.0–1.0)
Urobilinogen, UA: 1 mg/dL (ref 0.0–1.0)
pH: 6 (ref 5.0–8.0)
pH: 5 (ref 5.0–8.0)

## 2011-01-27 LAB — HEMOGLOBIN AND HEMATOCRIT, BLOOD
HCT: 25.5 % — ABNORMAL LOW (ref 39.0–52.0)
HCT: 26 % — ABNORMAL LOW (ref 39.0–52.0)
HCT: 26.2 % — ABNORMAL LOW (ref 39.0–52.0)
Hemoglobin: 8.3 g/dL — ABNORMAL LOW (ref 13.0–17.0)
Hemoglobin: 8.4 g/dL — ABNORMAL LOW (ref 13.0–17.0)

## 2011-01-27 LAB — POCT I-STAT 4, (NA,K, GLUC, HGB,HCT)
Glucose, Bld: 121 mg/dL — ABNORMAL HIGH (ref 70–99)
HCT: 30 % — ABNORMAL LOW (ref 39.0–52.0)
Hemoglobin: 10.2 g/dL — ABNORMAL LOW (ref 13.0–17.0)

## 2011-01-27 LAB — ABO/RH: ABO/RH(D): O POS

## 2011-01-27 LAB — PROTIME-INR
INR: 1.2 (ref 0.00–1.49)
Prothrombin Time: 15.2 seconds (ref 11.6–15.2)

## 2011-01-27 LAB — LIPASE, BLOOD: Lipase: 48 U/L (ref 11–59)

## 2011-01-27 LAB — HEPATITIS C ANTIBODY, REFLEX: HCV Ab: REACTIVE — AB
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
## 2011-01-31 LAB — DIFFERENTIAL
Basophils Relative: 1 % (ref 0–1)
Eosinophils Absolute: 0.1 10*3/uL (ref 0.0–0.7)
Eosinophils Relative: 3 % (ref 0–5)
Monocytes Absolute: 0.8 10*3/uL (ref 0.1–1.0)
Monocytes Relative: 18 % — ABNORMAL HIGH (ref 3–12)
Neutrophils Relative %: 36 % — ABNORMAL LOW (ref 43–77)

## 2011-01-31 LAB — URINALYSIS, ROUTINE W REFLEX MICROSCOPIC
Glucose, UA: NEGATIVE mg/dL
Hgb urine dipstick: NEGATIVE
Ketones, ur: NEGATIVE mg/dL
Protein, ur: NEGATIVE mg/dL
pH: 6 (ref 5.0–8.0)

## 2011-01-31 LAB — COMPREHENSIVE METABOLIC PANEL
ALT: 34 U/L (ref 0–53)
AST: 74 U/L — ABNORMAL HIGH (ref 0–37)
Albumin: 3.2 g/dL — ABNORMAL LOW (ref 3.5–5.2)
Alkaline Phosphatase: 64 U/L (ref 39–117)
GFR calc Af Amer: >60 mL/min (ref >60)
Glucose, Bld: 103 mg/dL — ABNORMAL HIGH (ref 70–99)
Potassium: 4 mEq/L (ref 3.5–5.1)
Sodium: 141 mEq/L (ref 135–145)
Total Protein: 7.2 g/dL (ref 6.0–8.3)

## 2011-01-31 LAB — POCT I-STAT, CHEM 8
BUN: 6 mg/dL (ref 6–23)
Calcium, Ion: 1.09 mmol/L — ABNORMAL LOW (ref 1.12–1.32)
Chloride: 106 mEq/L (ref 96–112)
Creatinine, Ser: 0.8 mg/dL (ref 0.4–1.5)
Glucose, Bld: 95 mg/dL (ref 70–99)
TCO2: 25 mmol/L (ref 0–100)

## 2011-01-31 LAB — RAPID URINE DRUG SCREEN, HOSP PERFORMED
Amphetamines: NOT DETECTED
Barbiturates: NOT DETECTED
Benzodiazepines: NOT DETECTED
Cocaine: NOT DETECTED

## 2011-01-31 LAB — CBC
Hemoglobin: 9 g/dL — ABNORMAL LOW (ref 13.0–17.0)
RDW: 19.3 % — ABNORMAL HIGH (ref 11.5–15.5)

## 2011-01-31 LAB — ETHANOL: Alcohol, Ethyl (B): 141 mg/dL — ABNORMAL HIGH (ref 0–10)

## 2011-02-01 LAB — BASIC METABOLIC PANEL
Calcium: 8.4 mg/dL (ref 8.4–10.5)
GFR calc Af Amer: >60 mL/min (ref >60)
GFR calc non Af Amer: >60 mL/min (ref >60)
Potassium: 3.9 mEq/L (ref 3.5–5.1)
Sodium: 134 mEq/L — ABNORMAL LOW (ref 135–145)

## 2011-02-01 LAB — COMPREHENSIVE METABOLIC PANEL
ALT: 30 U/L (ref 0–53)
ALT: 35 U/L (ref 0–53)
ALT: 52 U/L (ref 0–53)
AST: 59 U/L — ABNORMAL HIGH (ref 0–37)
AST: 61 U/L — ABNORMAL HIGH (ref 0–37)
AST: 69 U/L — ABNORMAL HIGH (ref 0–37)
AST: 69 U/L — ABNORMAL HIGH (ref 0–37)
AST: 101 U/L — ABNORMAL HIGH (ref 0–37)
Albumin: 2.9 g/dL — ABNORMAL LOW (ref 3.5–5.2)
Albumin: 3.2 g/dL — ABNORMAL LOW (ref 3.5–5.2)
Albumin: 3.2 g/dL — ABNORMAL LOW (ref 3.5–5.2)
Albumin: 3.3 g/dL — ABNORMAL LOW (ref 3.5–5.2)
Albumin: 3.3 g/dL — ABNORMAL LOW (ref 3.5–5.2)
Alkaline Phosphatase: 70 U/L (ref 39–117)
BUN: 3 mg/dL — ABNORMAL LOW (ref 6–23)
CO2: 23 mEq/L (ref 19–32)
Calcium: 8 mg/dL — ABNORMAL LOW (ref 8.4–10.5)
Calcium: 8.4 mg/dL (ref 8.4–10.5)
Calcium: 8.6 mg/dL (ref 8.4–10.5)
Calcium: 8.6 mg/dL (ref 8.4–10.5)
Chloride: 105 mEq/L (ref 96–112)
Chloride: 111 mEq/L (ref 96–112)
Chloride: 111 mEq/L (ref 96–112)
Chloride: 114 mEq/L — ABNORMAL HIGH (ref 96–112)
Creatinine, Ser: 0.65 mg/dL (ref 0.4–1.5)
Creatinine, Ser: 0.66 mg/dL (ref 0.4–1.5)
Creatinine, Ser: 0.67 mg/dL (ref 0.4–1.5)
Creatinine, Ser: 0.72 mg/dL (ref 0.4–1.5)
Creatinine, Ser: 0.74 mg/dL (ref 0.4–1.5)
GFR calc Af Amer: >60 mL/min (ref >60)
GFR calc Af Amer: >60 mL/min (ref >60)
GFR calc Af Amer: >60 mL/min (ref >60)
GFR calc Af Amer: >60 mL/min (ref >60)
GFR calc Af Amer: >60 mL/min (ref >60)
GFR calc non Af Amer: >60 mL/min (ref >60)
Glucose, Bld: 112 mg/dL — ABNORMAL HIGH (ref 70–99)
Potassium: 3.6 mEq/L (ref 3.5–5.1)
Sodium: 142 mEq/L (ref 135–145)
Sodium: 146 mEq/L — ABNORMAL HIGH (ref 135–145)
Sodium: 142 mEq/L (ref 135–145)
Total Bilirubin: 1.1 mg/dL (ref 0.3–1.2)
Total Bilirubin: 1.1 mg/dL (ref 0.3–1.2)
Total Bilirubin: 1.8 mg/dL — ABNORMAL HIGH (ref 0.3–1.2)
Total Protein: 7.4 g/dL (ref 6.0–8.3)
Total Protein: 7.5 g/dL (ref 6.0–8.3)
Total Protein: 7.9 g/dL (ref 6.0–8.3)
Total Protein: 8 g/dL (ref 6.0–8.3)

## 2011-02-01 LAB — CBC
HCT: 28.5 % — ABNORMAL LOW (ref 39.0–52.0)
HCT: 32.1 % — ABNORMAL LOW (ref 39.0–52.0)
HCT: 33.2 % — ABNORMAL LOW (ref 39.0–52.0)
Hemoglobin: 10.7 g/dL — ABNORMAL LOW (ref 13.0–17.0)
Hemoglobin: 9.5 g/dL — ABNORMAL LOW (ref 13.0–17.0)
MCHC: 32.2 g/dL (ref 30.0–36.0)
MCV: 71 fL — ABNORMAL LOW (ref 78.0–100.0)
MCV: 71.7 fL — ABNORMAL LOW (ref 78.0–100.0)
MCV: 71.7 fL — ABNORMAL LOW (ref 78.0–100.0)
MCV: 72 fL — ABNORMAL LOW (ref 78.0–100.0)
Platelets: 121 10*3/uL — ABNORMAL LOW (ref 150–400)
Platelets: 143 10*3/uL — ABNORMAL LOW (ref 150–400)
Platelets: 147 10*3/uL — ABNORMAL LOW (ref 150–400)
Platelets: 153 10*3/uL (ref 150–400)
RBC: 4.05 MIL/uL — ABNORMAL LOW (ref 4.22–5.81)
RBC: 4.11 MIL/uL — ABNORMAL LOW (ref 4.22–5.81)
RBC: 4.68 MIL/uL (ref 4.22–5.81)
RDW: 20.5 % — ABNORMAL HIGH (ref 11.5–15.5)
RDW: 21.2 % — ABNORMAL HIGH (ref 11.5–15.5)
RDW: 22.3 % — ABNORMAL HIGH (ref 11.5–15.5)
WBC: 4.2 10*3/uL (ref 4.0–10.5)
WBC: 4.9 10*3/uL (ref 4.0–10.5)
WBC: 5.3 10*3/uL (ref 4.0–10.5)
WBC: 5.5 10*3/uL (ref 4.0–10.5)
WBC: 6.5 10*3/uL (ref 4.0–10.5)

## 2011-02-01 LAB — POCT I-STAT, CHEM 8
BUN: 5 mg/dL — ABNORMAL LOW (ref 6–23)
Calcium, Ion: 1.09 mmol/L — ABNORMAL LOW (ref 1.12–1.32)
Chloride: 109 mEq/L (ref 96–112)
Chloride: 107 mEq/L (ref 96–112)
Creatinine, Ser: 0.7 mg/dL (ref 0.4–1.5)
Glucose, Bld: 87 mg/dL (ref 70–99)
Glucose, Bld: 88 mg/dL (ref 70–99)
HCT: 36 % — ABNORMAL LOW (ref 39.0–52.0)
Hemoglobin: 12.2 g/dL — ABNORMAL LOW (ref 13.0–17.0)
Potassium: 3.7 mEq/L (ref 3.5–5.1)
Sodium: 150 mEq/L — ABNORMAL HIGH (ref 135–145)
TCO2: 26 mmol/L (ref 0–100)

## 2011-02-01 LAB — GASTRIC OCCULT BLOOD (1-CARD TO LAB): Occult Blood, Gastric: NEGATIVE

## 2011-02-01 LAB — LIPASE, BLOOD
Lipase: 46 U/L (ref 11–59)
Lipase: 59 U/L (ref 11–59)
Lipase: 67 U/L — ABNORMAL HIGH (ref 11–59)
Lipase: 62 U/L — ABNORMAL HIGH (ref 11–59)

## 2011-02-01 LAB — DIFFERENTIAL
Basophils Absolute: 0 10*3/uL (ref 0.0–0.1)
Basophils Absolute: 0.1 10*3/uL (ref 0.0–0.1)
Basophils Relative: 0 % (ref 0–1)
Basophils Relative: 1 % (ref 0–1)
Basophils Relative: 1 % (ref 0–1)
Eosinophils Absolute: 0.2 10*3/uL (ref 0.0–0.7)
Eosinophils Relative: 3 % (ref 0–5)
Eosinophils Relative: 5 % (ref 0–5)
Eosinophils Relative: 6 % — ABNORMAL HIGH (ref 0–5)
Eosinophils Relative: 6 % — ABNORMAL HIGH (ref 0–5)
Lymphocytes Relative: 29 % (ref 12–46)
Lymphocytes Relative: 48 % — ABNORMAL HIGH (ref 12–46)
Lymphs Abs: 2.1 10*3/uL (ref 0.7–4.0)
Lymphs Abs: 2.4 10*3/uL (ref 0.7–4.0)
Lymphs Abs: 2.5 10*3/uL (ref 0.7–4.0)
Monocytes Absolute: 0.7 10*3/uL (ref 0.1–1.0)
Monocytes Relative: 13 % — ABNORMAL HIGH (ref 3–12)
Monocytes Relative: 9 % (ref 3–12)
Neutro Abs: 1.9 10*3/uL (ref 1.7–7.7)
Neutro Abs: 2.3 10*3/uL (ref 1.7–7.7)
Neutrophils Relative %: 40 % — ABNORMAL LOW (ref 43–77)
Neutrophils Relative %: 53 % (ref 43–77)

## 2011-02-01 LAB — RAPID URINE DRUG SCREEN, HOSP PERFORMED
Amphetamines: NOT DETECTED
Amphetamines: NOT DETECTED
Amphetamines: NOT DETECTED
Barbiturates: NOT DETECTED
Benzodiazepines: POSITIVE — AB
Benzodiazepines: POSITIVE — AB
Benzodiazepines: POSITIVE — AB
Benzodiazepines: POSITIVE — AB
Cocaine: NOT DETECTED
Cocaine: NOT DETECTED
Tetrahydrocannabinol: NOT DETECTED
Tetrahydrocannabinol: NOT DETECTED

## 2011-02-01 LAB — TRICYCLICS SCREEN, URINE: TCA Scrn: NOT DETECTED

## 2011-02-01 LAB — TYPE AND SCREEN
ABO/RH(D): O POS
Antibody Screen: NEGATIVE

## 2011-02-01 LAB — FOLATE: Folate: 11.8 ng/mL

## 2011-02-01 LAB — APTT: aPTT: 30 seconds (ref 24–37)

## 2011-02-01 LAB — TSH: TSH: 1.367 u[IU]/mL (ref 0.350–4.500)

## 2011-02-01 LAB — PROTIME-INR
INR: 1.2 (ref 0.00–1.49)
Prothrombin Time: 15.8 seconds — ABNORMAL HIGH (ref 11.6–15.2)

## 2011-02-01 LAB — ACETAMINOPHEN LEVEL: Acetaminophen (Tylenol), Serum: <10 ug/mL — ABNORMAL LOW (ref 10–30)

## 2011-02-01 LAB — HEMOCCULT GUIAC POC 1CARD (OFFICE): Fecal Occult Bld: POSITIVE

## 2011-02-01 LAB — ETHANOL
Alcohol, Ethyl (B): 244 mg/dL — ABNORMAL HIGH (ref 0–10)
Alcohol, Ethyl (B): 338 mg/dL — ABNORMAL HIGH (ref 0–10)

## 2011-02-01 LAB — IRON AND TIBC
Saturation Ratios: 5 % — ABNORMAL LOW (ref 20–55)
UIBC: 418 ug/dL

## 2011-02-01 LAB — HEMOGLOBIN AND HEMATOCRIT, BLOOD: Hemoglobin: 9.2 g/dL — ABNORMAL LOW (ref 13.0–17.0)

## 2011-02-08 HISTORY — PX: EXPLORATORY LAPAROTOMY: SUR591
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
## 2011-02-15 IMAGING — CR DG ABDOMEN ACUTE W/ 1V CHEST
3 series · 3 of 3 positions shown · non-contrast
Comparison: 01/22/2009

CLINICAL DATA: Abdominal pain

ACUTE ABDOMEN SERIES (ABDOMEN 2 VIEW & CHEST 1 VIEW)

[w chest pa]
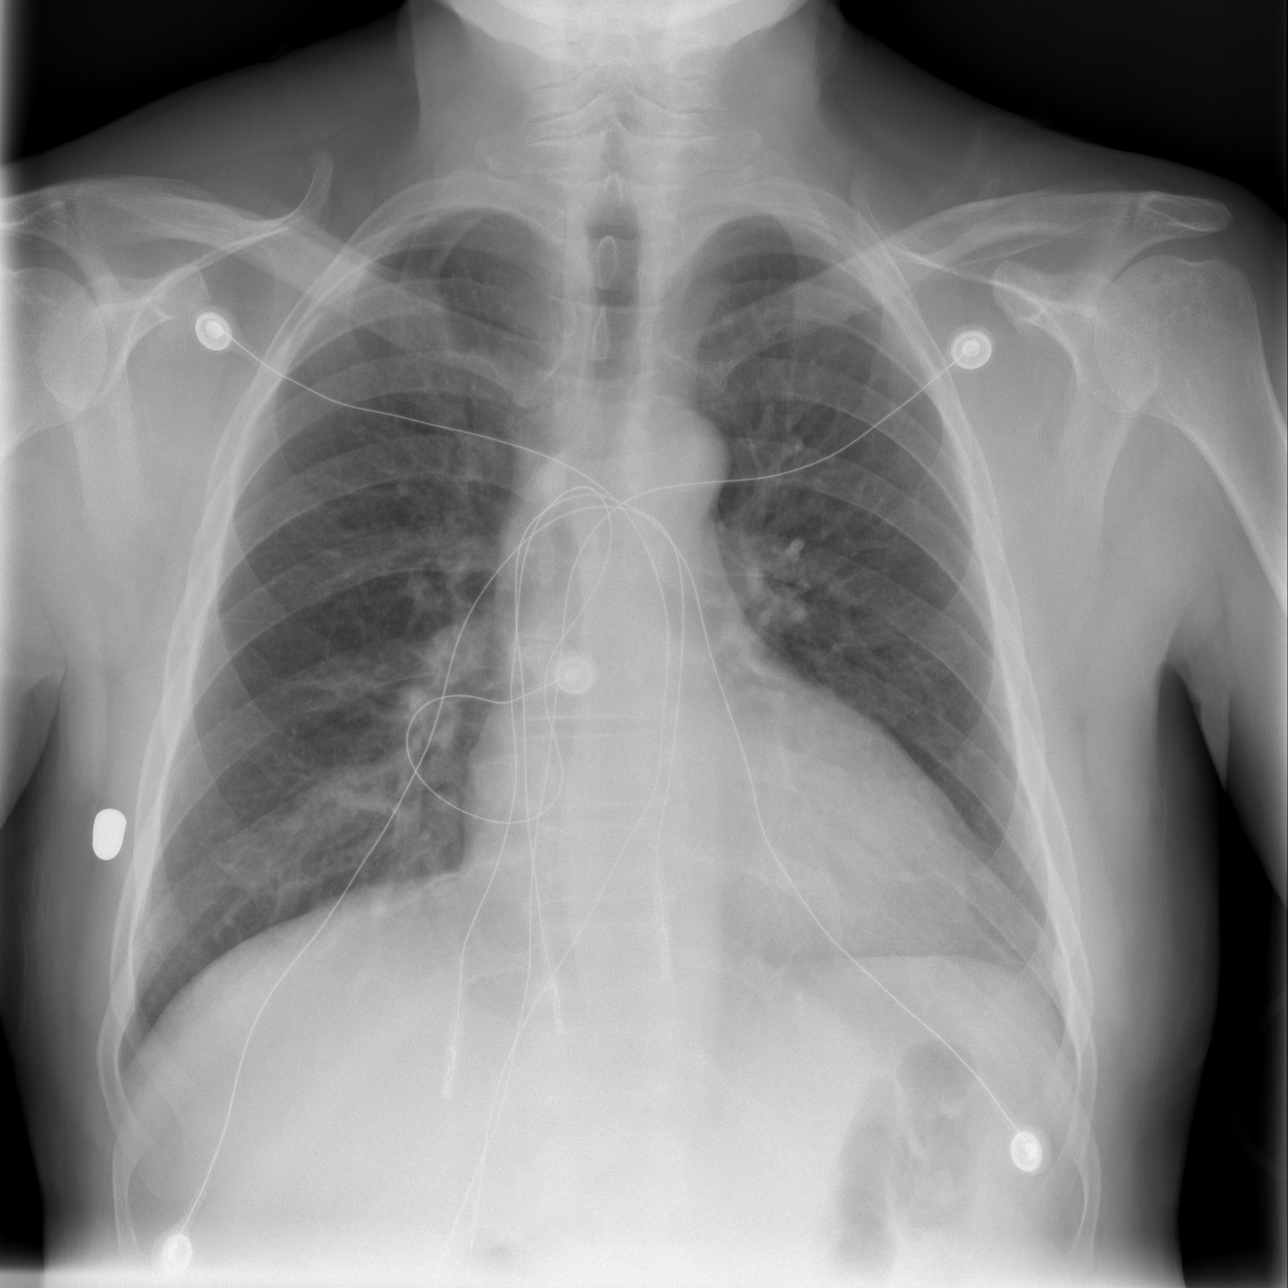

[w abdomen upright *]
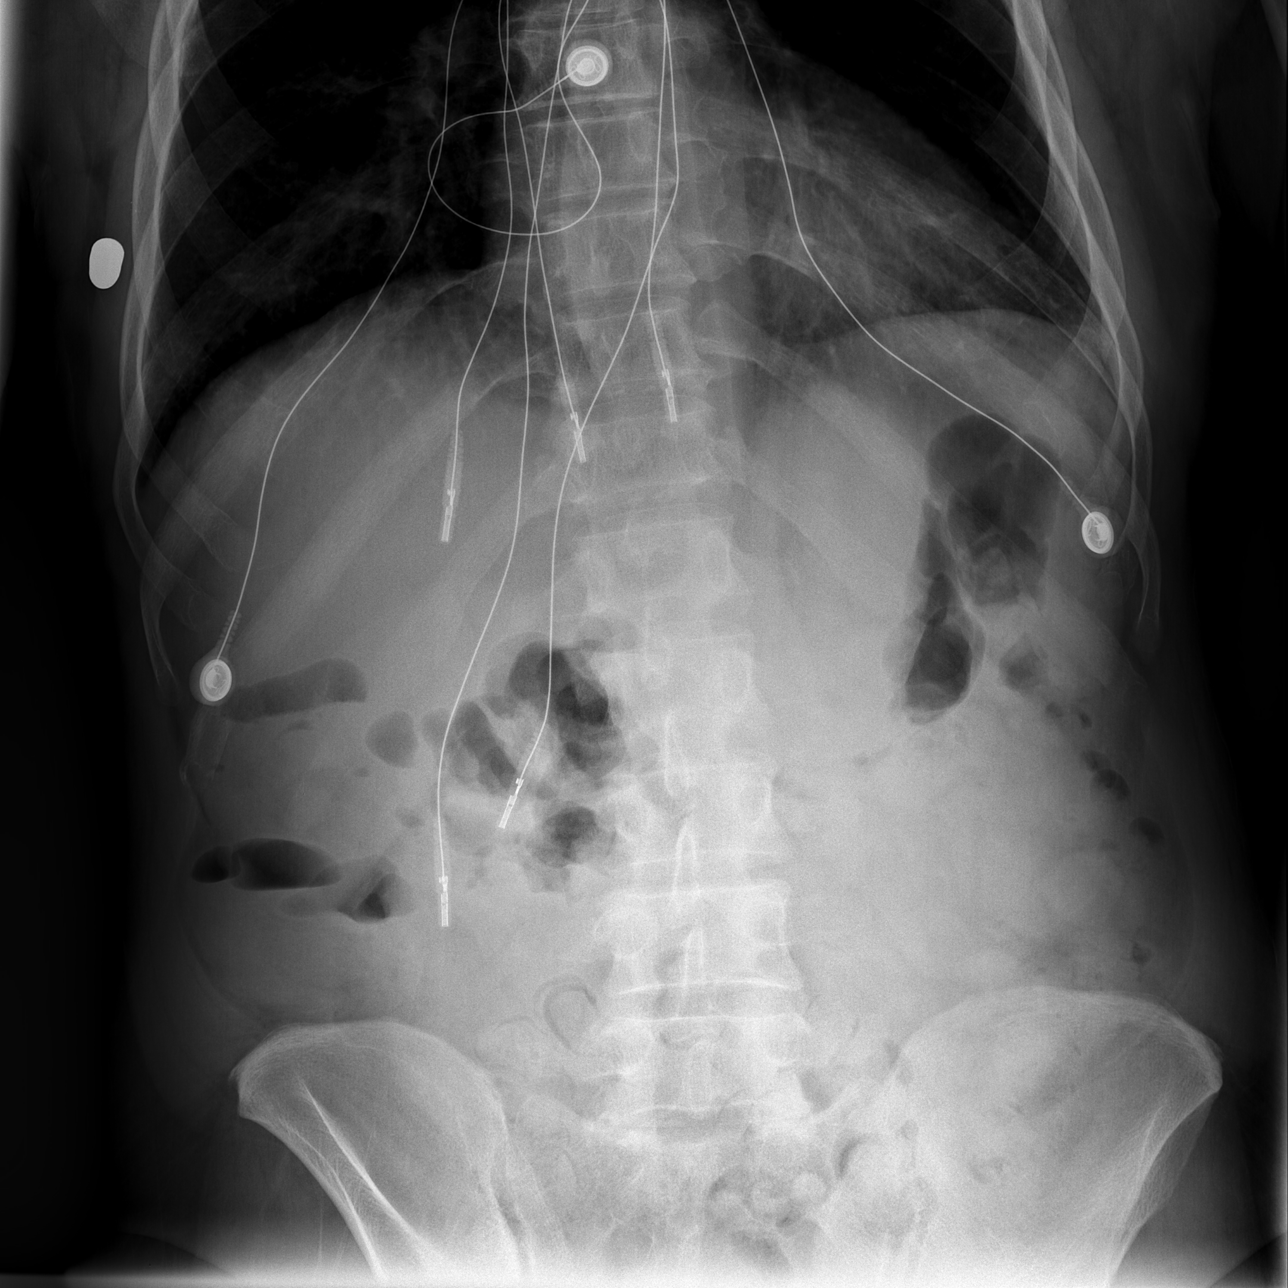

[t abdomen supine]
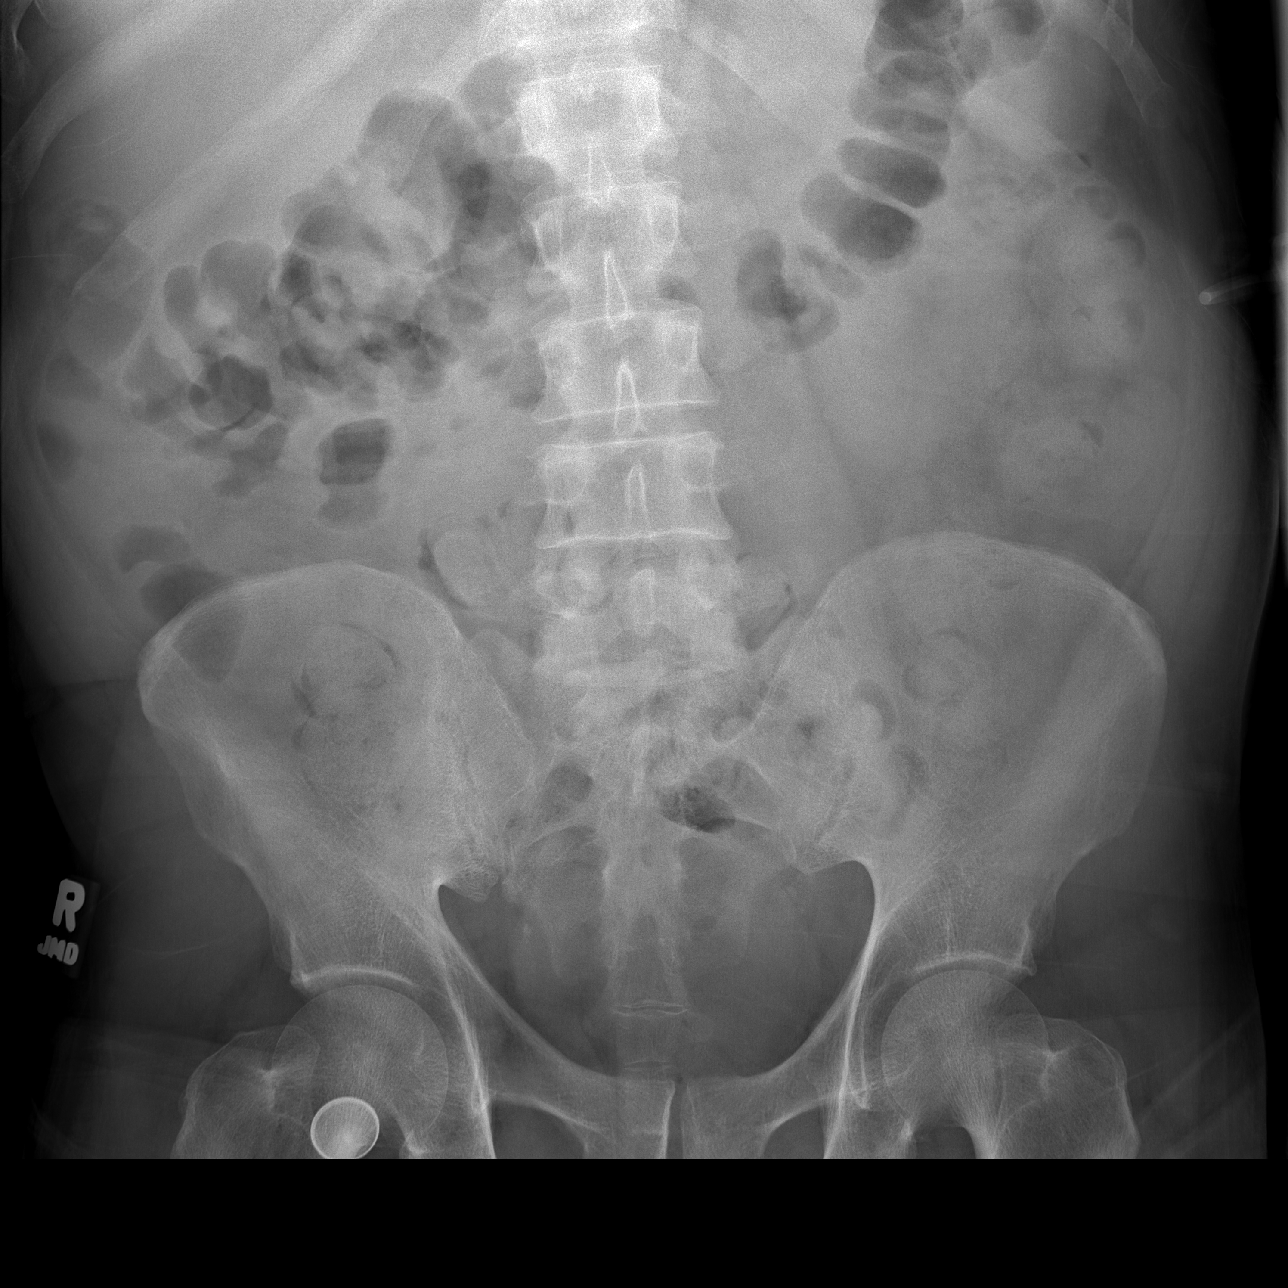

[3 of 3 positions shown; findings below may reference images not displayed]

FINDINGS: Increased pulmonary interstitial densities in the lung
bases bilaterally is unchanged from the radiograph 11/22/2008.  The
heart is upper normal.  Nonobstructive bowel gas pattern.
IMPRESSION: 1.  Nonobstructive bowel gas.
2.  Chronic pulmonary changes.

## 2011-02-16 IMAGING — CT CT PELVIS W/ CM
1 of 3 series · 14 of 32 positions shown, 19 images · IV contrast (agent unspecified)
Comparison: CT abdomen pelvis 12/22/2008

CT ABDOMEN

CLINICAL DATA: Abdominal pain, intoxicated, abdominal distention

CT ABDOMEN AND PELVIS WITH CONTRAST
TECHNIQUE: Multidetector CT imaging of the abdomen and pelvis was
performed using the standard protocol following bolus
administration of intravenous contrast.
Contrast: 100 ml  Omni 300

[Series 2: rtn ap with st · axial · 0.79mm/px · z∈[-454,-49]mm · 14 of 91 slices shown, 19 images]
[im 5/91  soft-tissue]
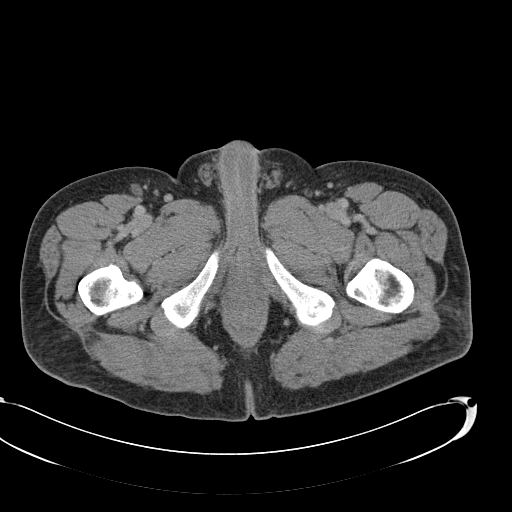
[im 5/91  bone]
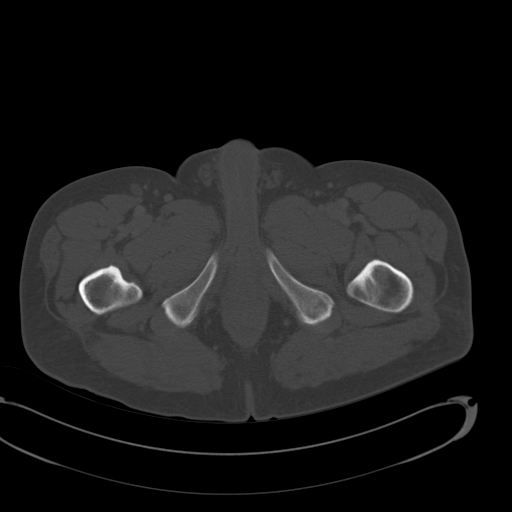
[im 15/91  soft-tissue]
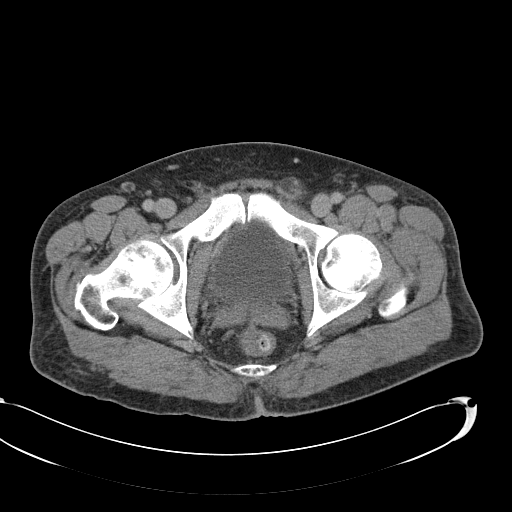
[im 19/91  soft-tissue]
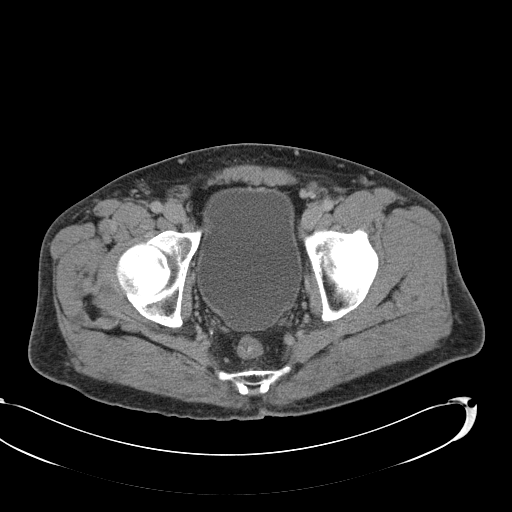
[im 24/91  soft-tissue]
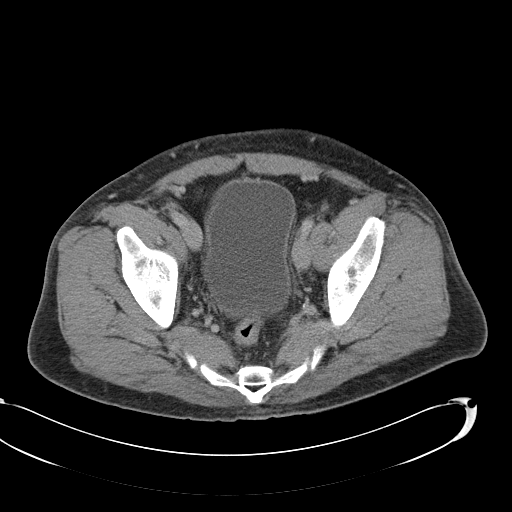
[im 34/91  soft-tissue]
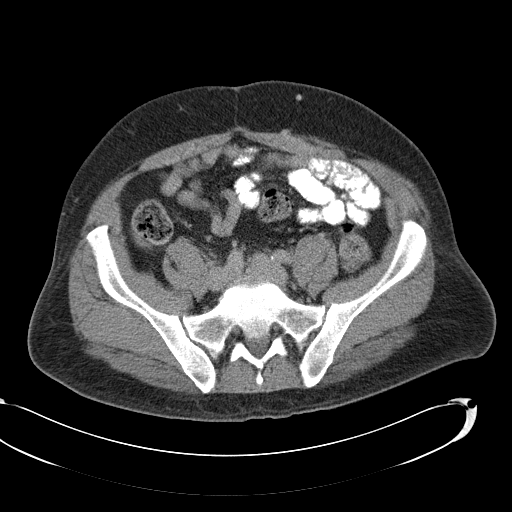
[im 38/91  soft-tissue]
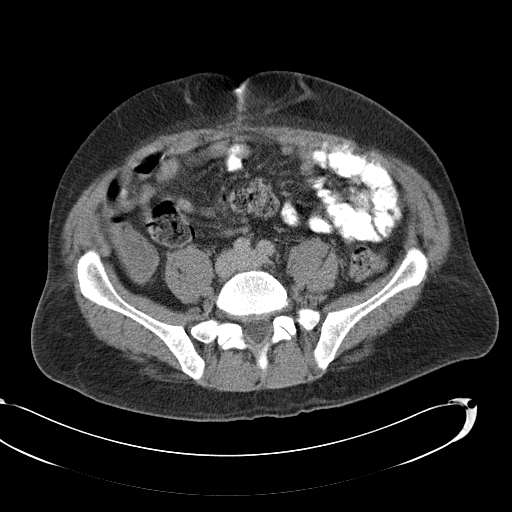
[im 48/91  soft-tissue]
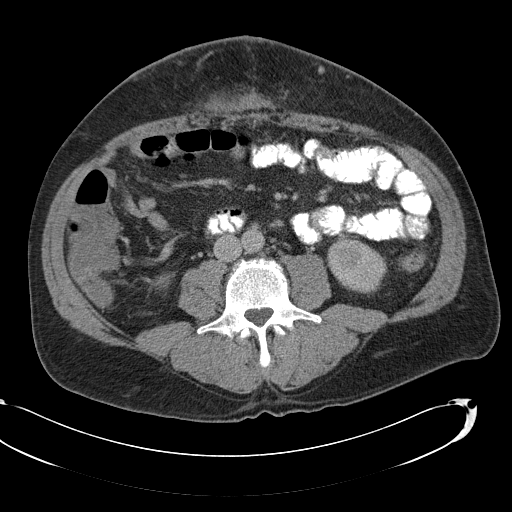
[im 53/91  soft-tissue]
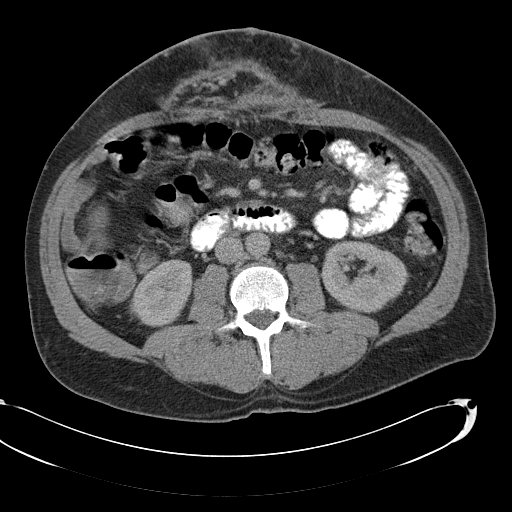
[im 57/91  soft-tissue]
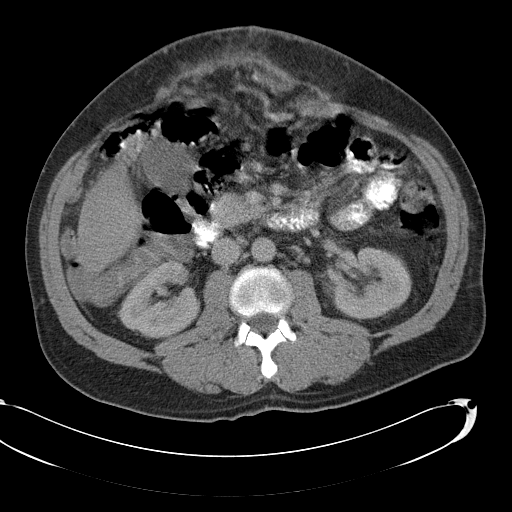
[im 57/91  bone]
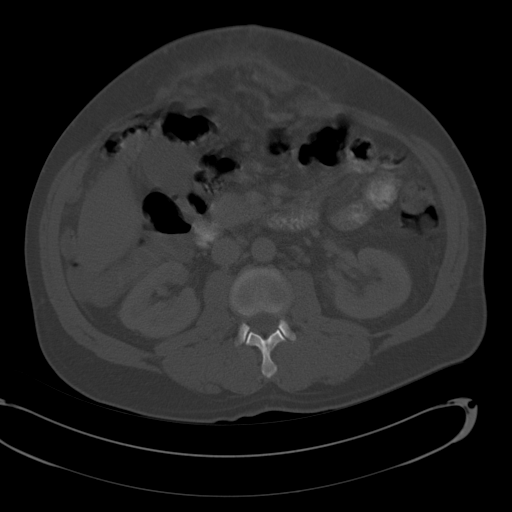
[im 67/91  soft-tissue]
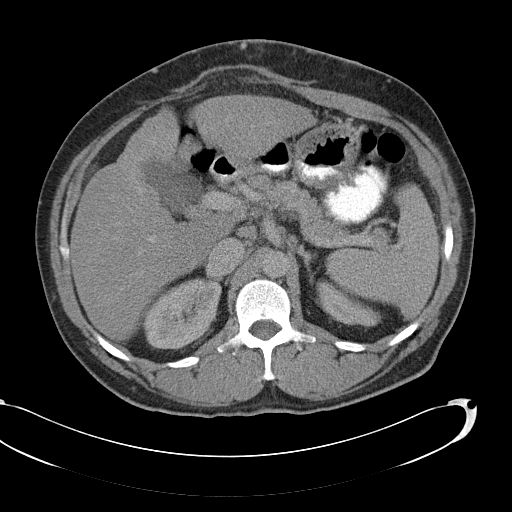
[im 72/91  soft-tissue]
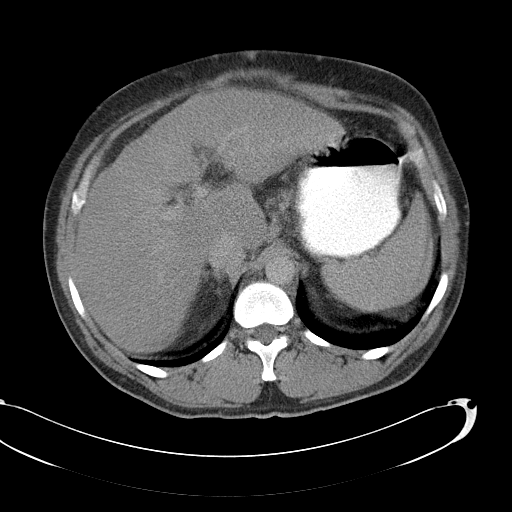
[im 72/91  lung]
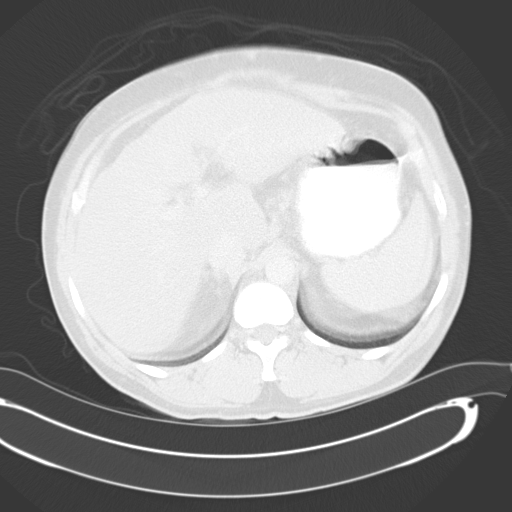
[im 76/91  soft-tissue]
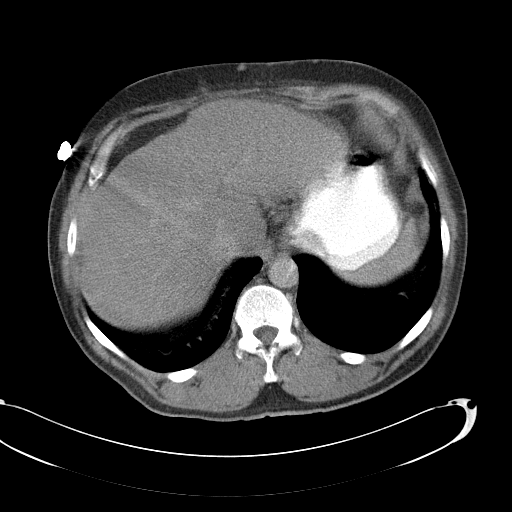
[im 76/91  lung]
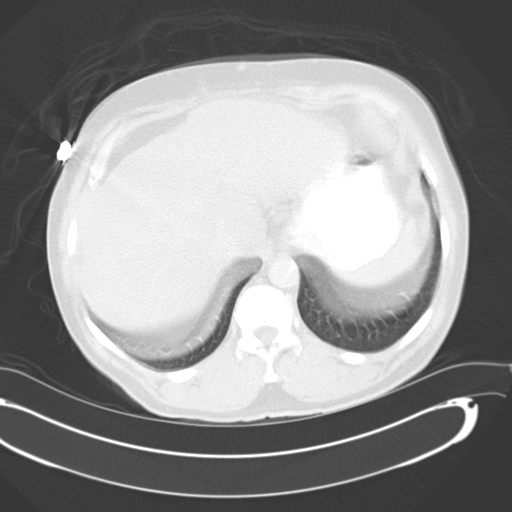
[im 81/91  lung]
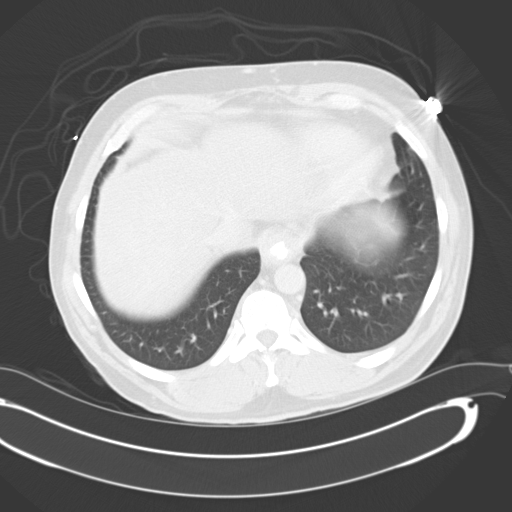
[im 86/91  soft-tissue]
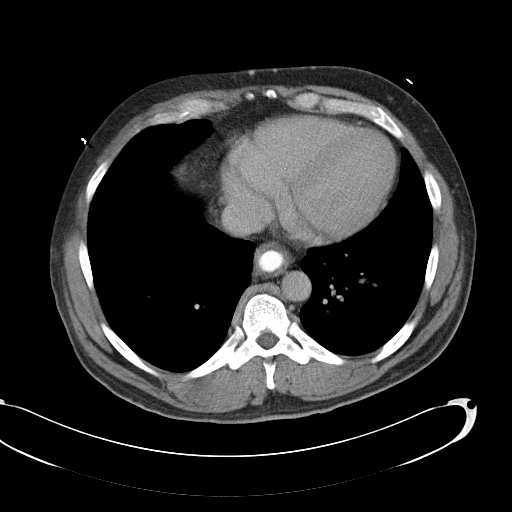
[im 86/91  lung]
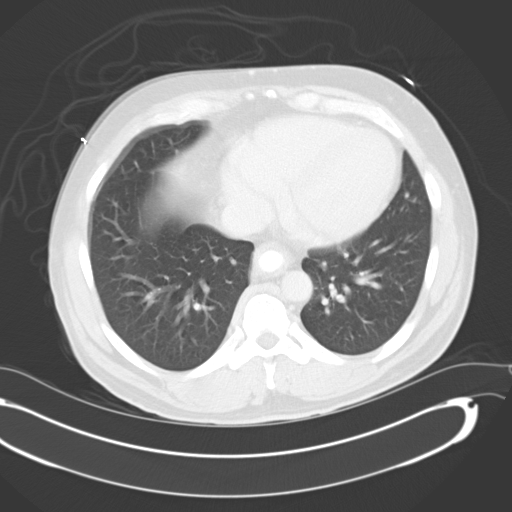

[14 of 32 positions shown; findings below may reference images not displayed]

FINDINGS: Lung bases are clear.  Diffuse fatty infiltration of the
liver.  No focal hepatic lesion.  The gallbladder, pancreas,
spleen, adrenal glands, and kidneys appear normal.

The stomach appears normal.  There is mild thickening of the distal
esophagus and reflux of oral contrast in the esophagus.  The
duodenum, small bowel, and cecum appear normal.  Colon appears
normal.  There is some fluid in the ascending colon.

Abdominal aorta is normal caliber.  There are multiple small
gastrohepatic ligament nodes and periportal lymph nodes.  The
portal vein is patent.  The splenic vein is patent.

There is a midline ventral hernia which is fat filled.  There are
periumbilical venous collaterals.
IMPRESSION: 1.  No clear acute abdominal process.
2.  Fluid in the ascending colon could represent diarrheal disease.
3.  Midline ventral hernia.
4.  Periportal and gastrohepatic adenopathy may be sequelae
ofcirrhosis.  Venous collaterals in the periumbilical region.
5.  Thickening in the distal esophagus were reflux could represent
esophagitis.

CT PELVIS
FINDINGS: No free fluid the pelvis.  The bladder is distended but
appears normal.  The prostate appears normal.  The rectum and
sigmoid colon appear normal.
IMPRESSION: No acute pelvic process.

## 2011-02-20 IMAGING — CR DG FOREARM 2V*L*
2 series · 2 of 2 positions shown · non-contrast
Comparison: None.

CLINICAL DATA: Mid left forearm laceration.

LEFT FOREARM - 2 VIEW

[view not recorded (1 of 2)]
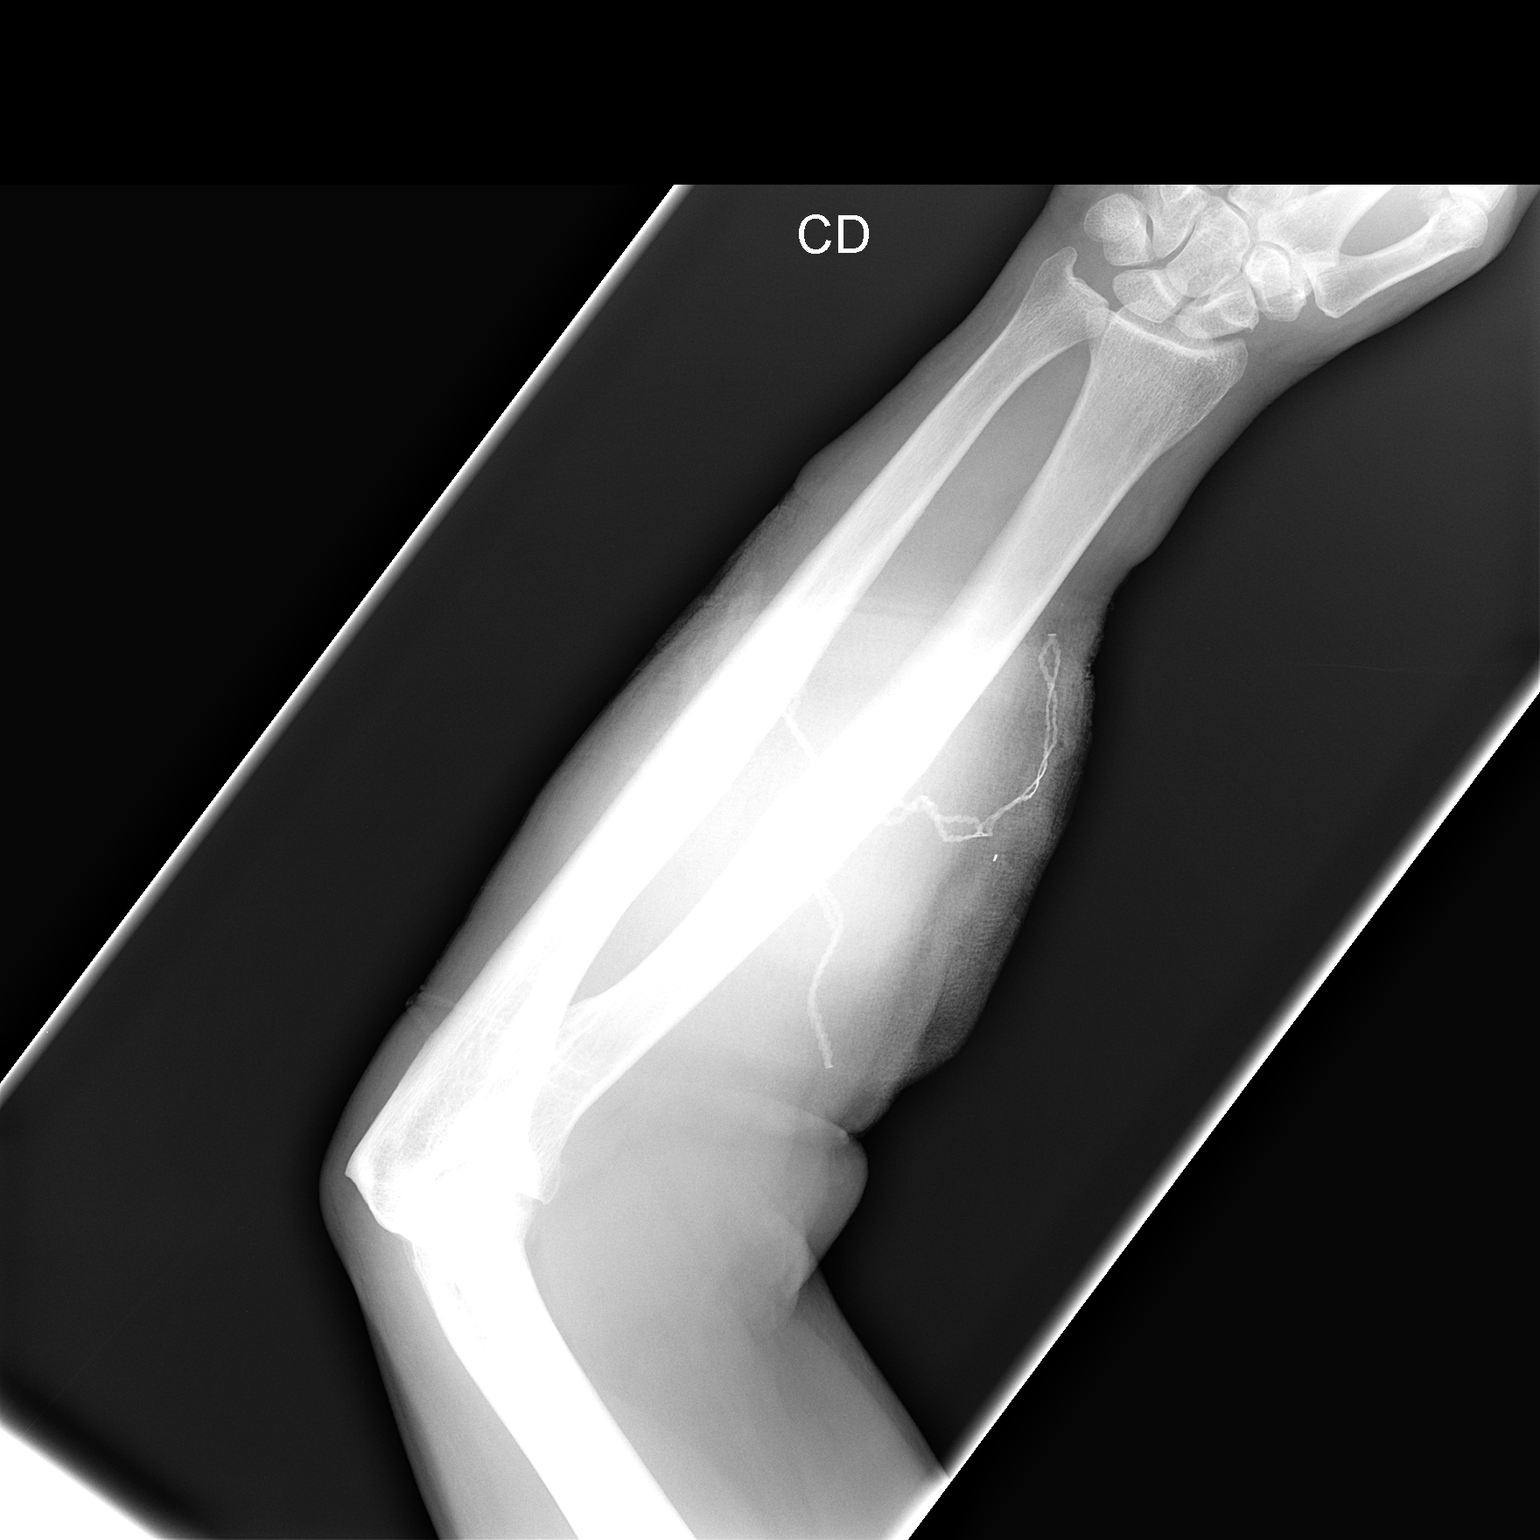

[view not recorded (2 of 2)]
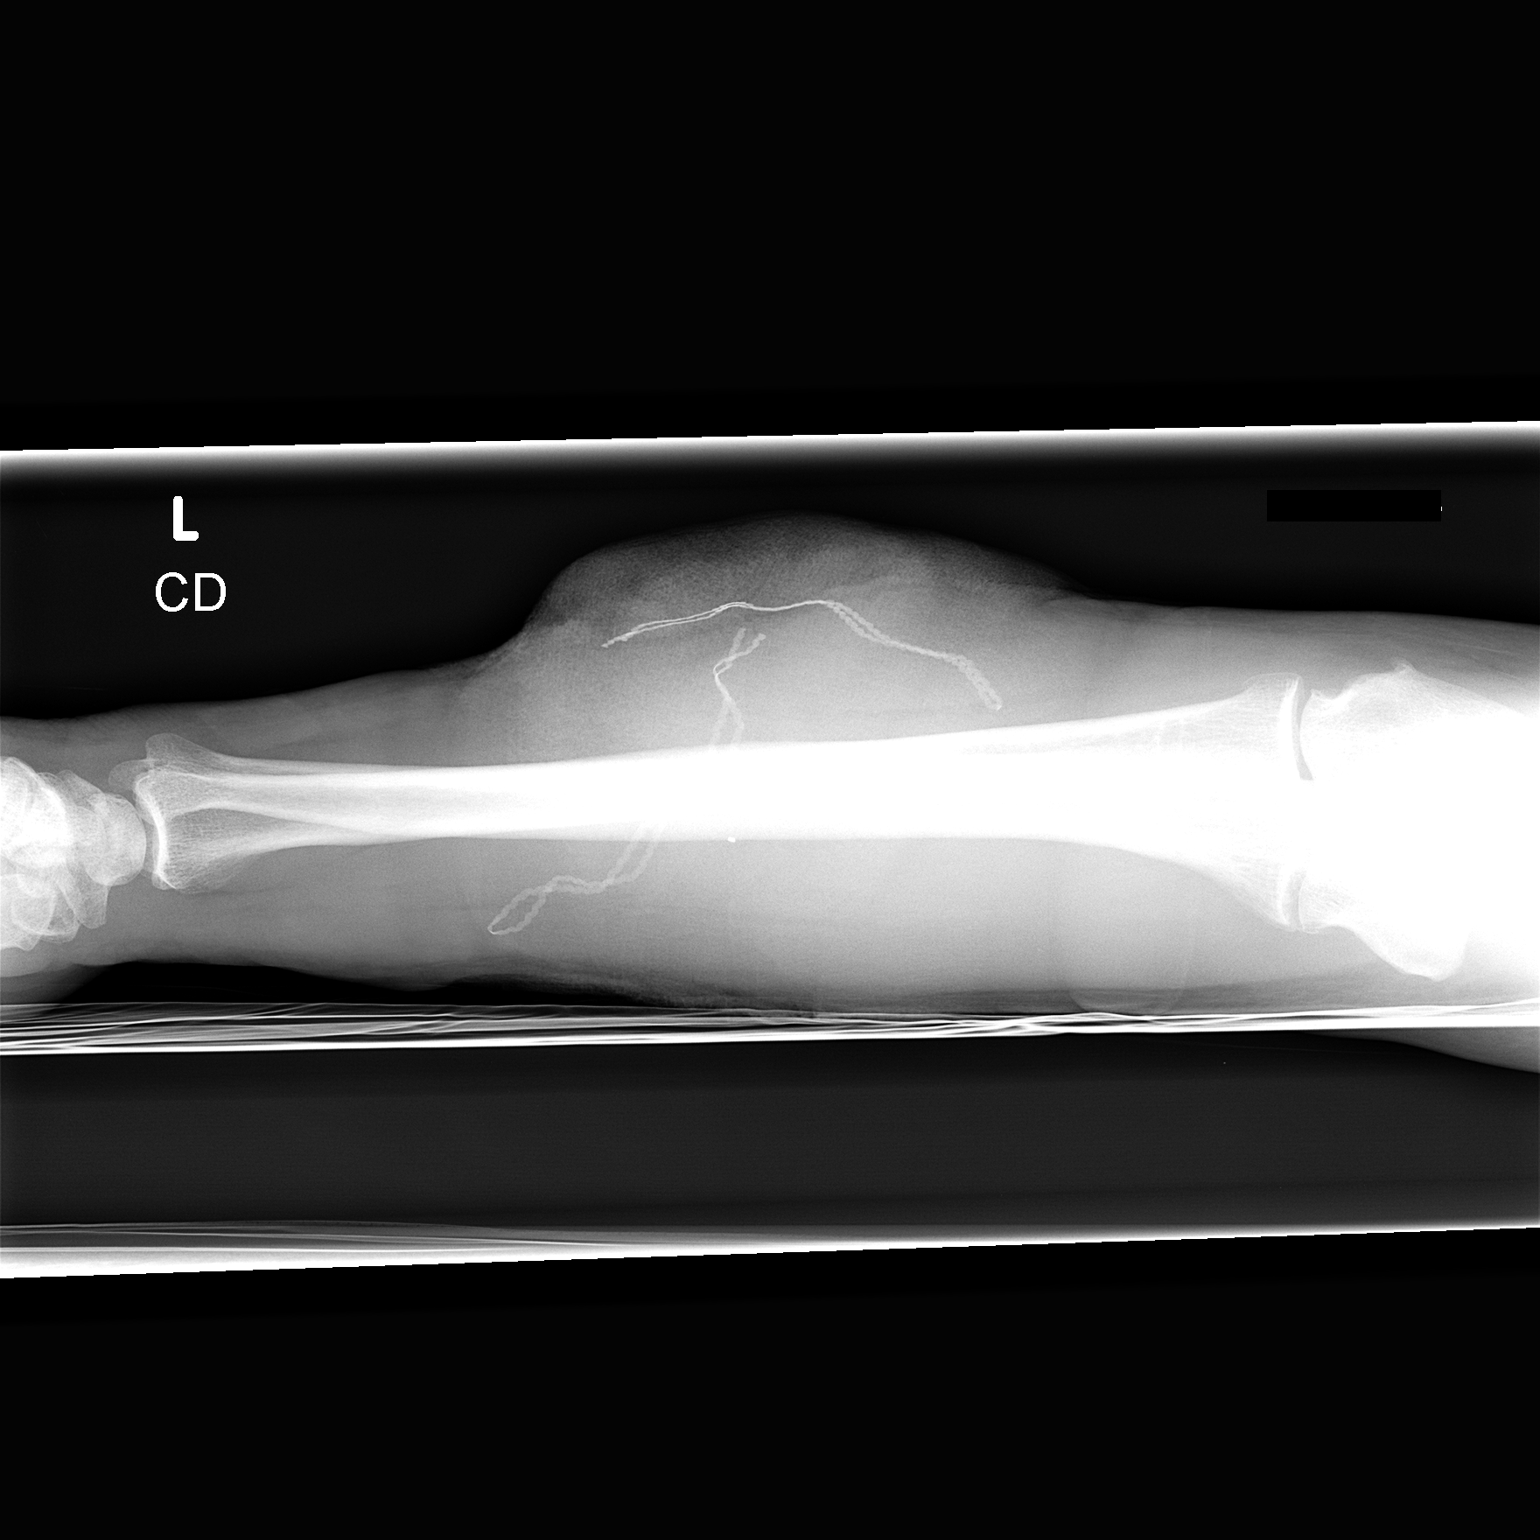

[2 of 2 positions shown; findings below may reference images not displayed]

FINDINGS: Bandage and artifacts overlying the mid forearm.  The
artifacts include a tiny metallic density that is most likely
within the bandage or on the underlying skin.  No fracture or
dislocation.
IMPRESSION: No fracture.  Bandage and artifacts, as described above.

## 2011-02-21 IMAGING — CR DG CHEST 1V PORT
1 series · 1 of 1 positions shown · non-contrast
Comparison: Earlier today.

CLINICAL DATA: Fever.

PORTABLE CHEST - 1 VIEW

[view not recorded]
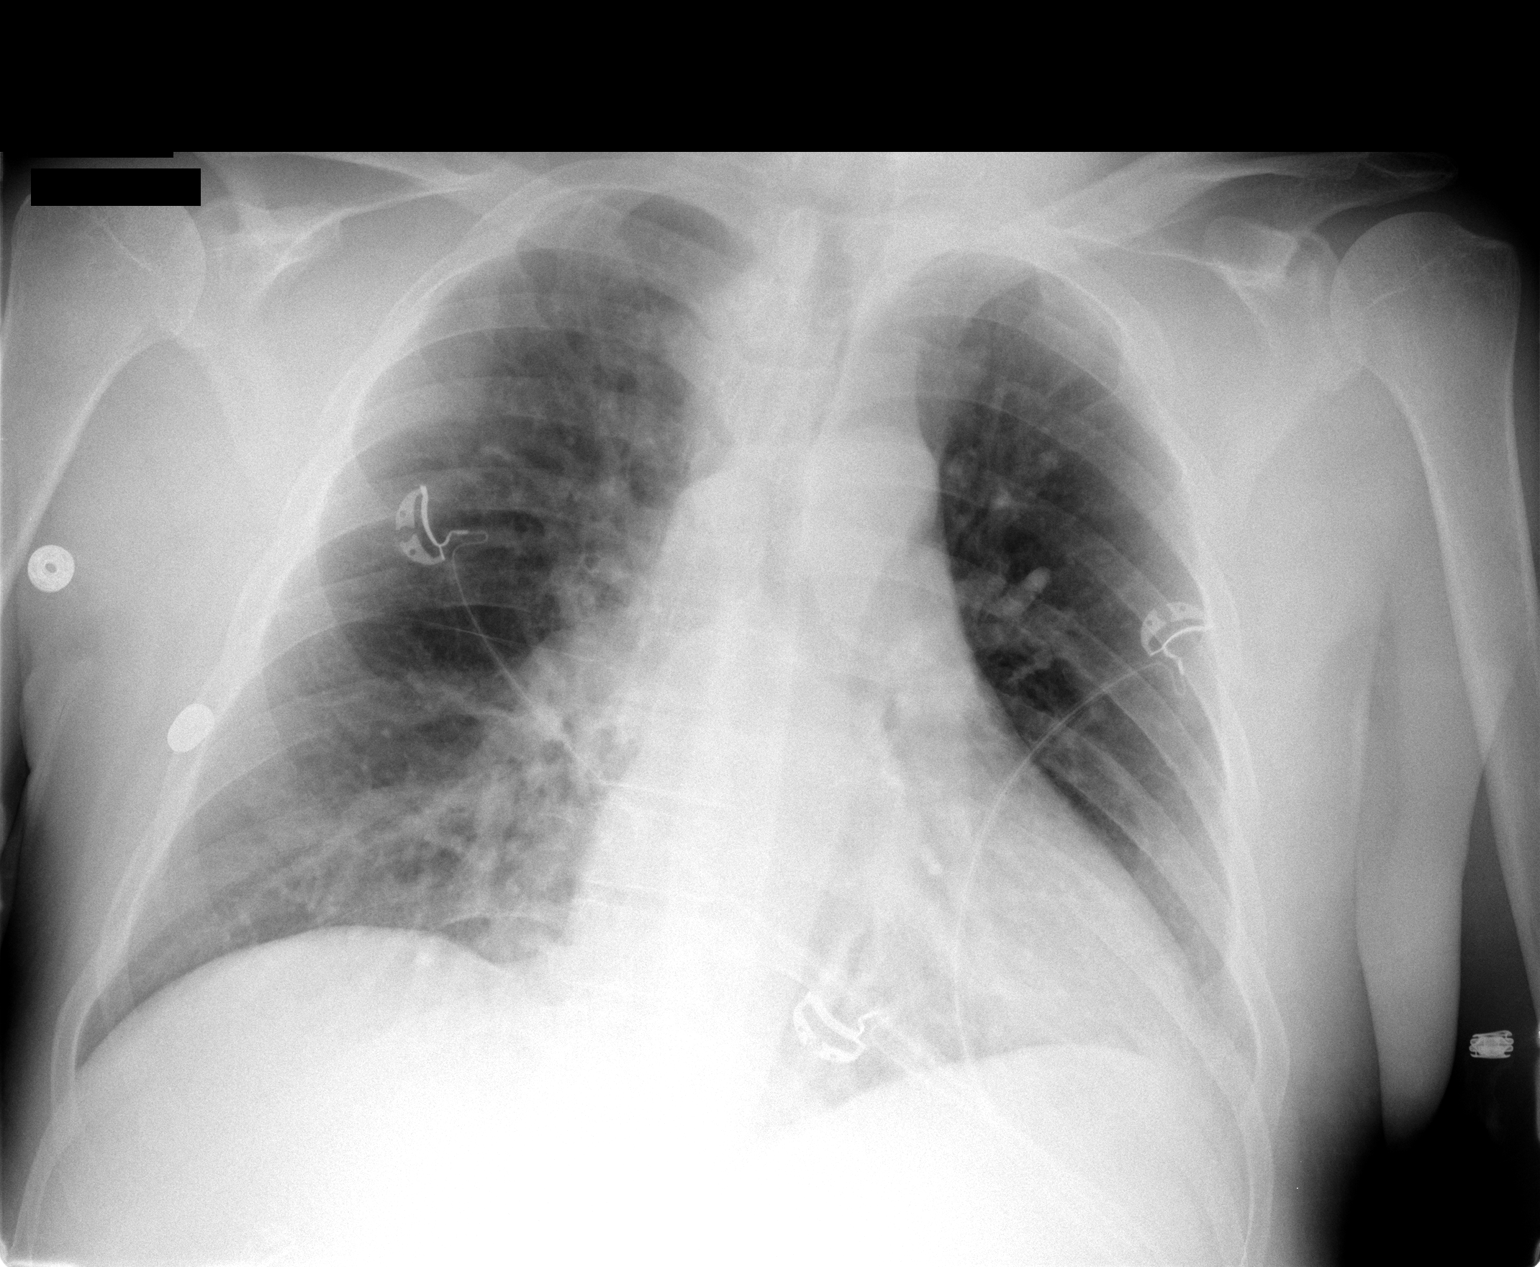

[1 of 1 positions shown; findings below may reference images not displayed]

FINDINGS: The cardiac silhouette currently appears mildly enlarged.
A mild increase in prominence of the pulmonary vasculature.  The
lungs remain clear.  Stable mild diffuse peribronchial thickening.
Unremarkable bones.
IMPRESSION: 1.  Interval mild cardiomegaly and pulmonary vascular congestion.
2.  Stable mild chronic bronchitic changes.

## 2011-02-21 IMAGING — CR DG CHEST 1V PORT
1 series · 1 of 1 positions shown · non-contrast
Comparison: 11/26/2008

CLINICAL DATA: Forearm laceration.  Anemia.

PORTABLE CHEST - 1 VIEW

[view not recorded]
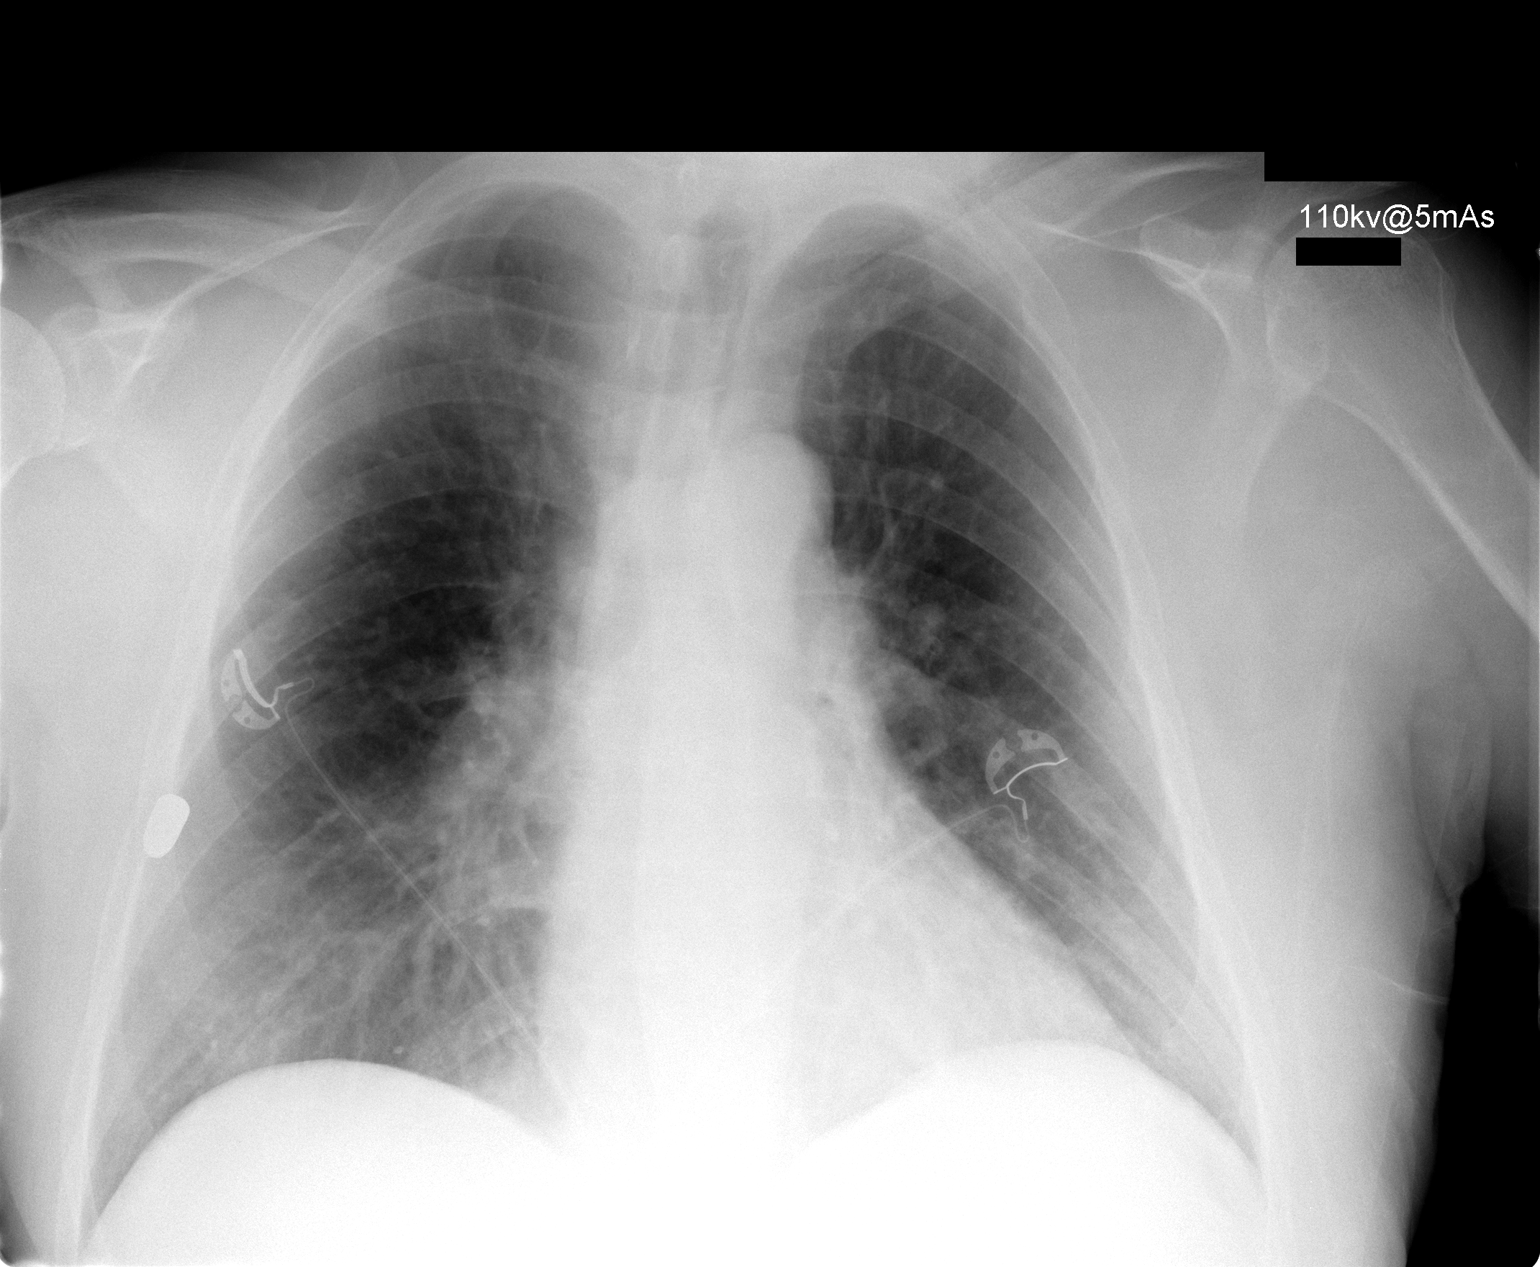

[1 of 1 positions shown; findings below may reference images not displayed]

FINDINGS: Lungs are clear.  The heart is normal in size.  No
pneumothorax.  No acute bony deformity.  No effusion.
IMPRESSION: No active cardiopulmonary disease.
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
## 2011-02-22 IMAGING — CR DG CHEST 1V PORT
1 series · 1 of 1 positions shown · non-contrast
Comparison: 01/29/2009

CLINICAL DATA: Fever.  GI bleeding.

PORTABLE CHEST - 1 VIEW

[view not recorded]
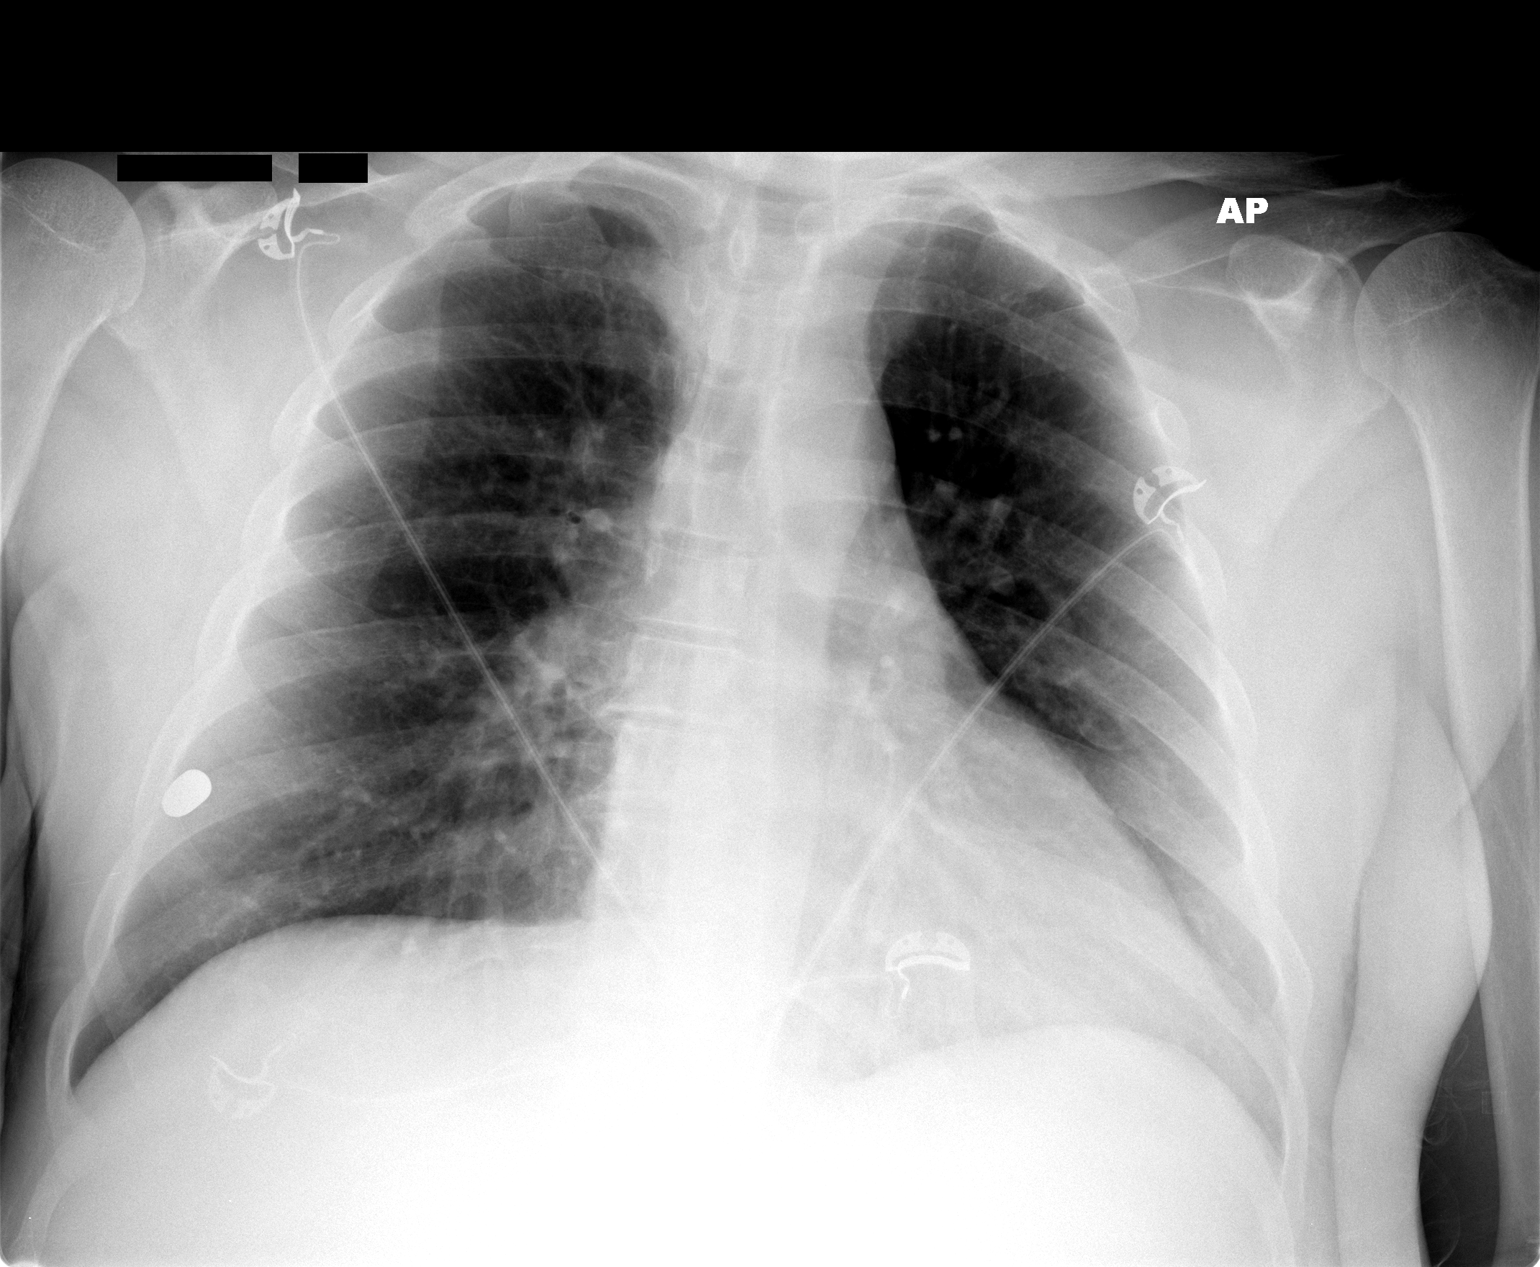

[1 of 1 positions shown; findings below may reference images not displayed]

FINDINGS: Heart size is within normal limits allowing for portable
technique.  Both lungs are clear.
IMPRESSION: No acute findings.

## 2011-02-24 IMAGING — CR DG FOREARM 2V*L*
2 series · 2 of 2 positions shown · non-contrast
Comparison: 01/30/2009

CLINICAL DATA: Redness and swelling

LEFT FOREARM - 2 VIEW

[x forearm ap left *]
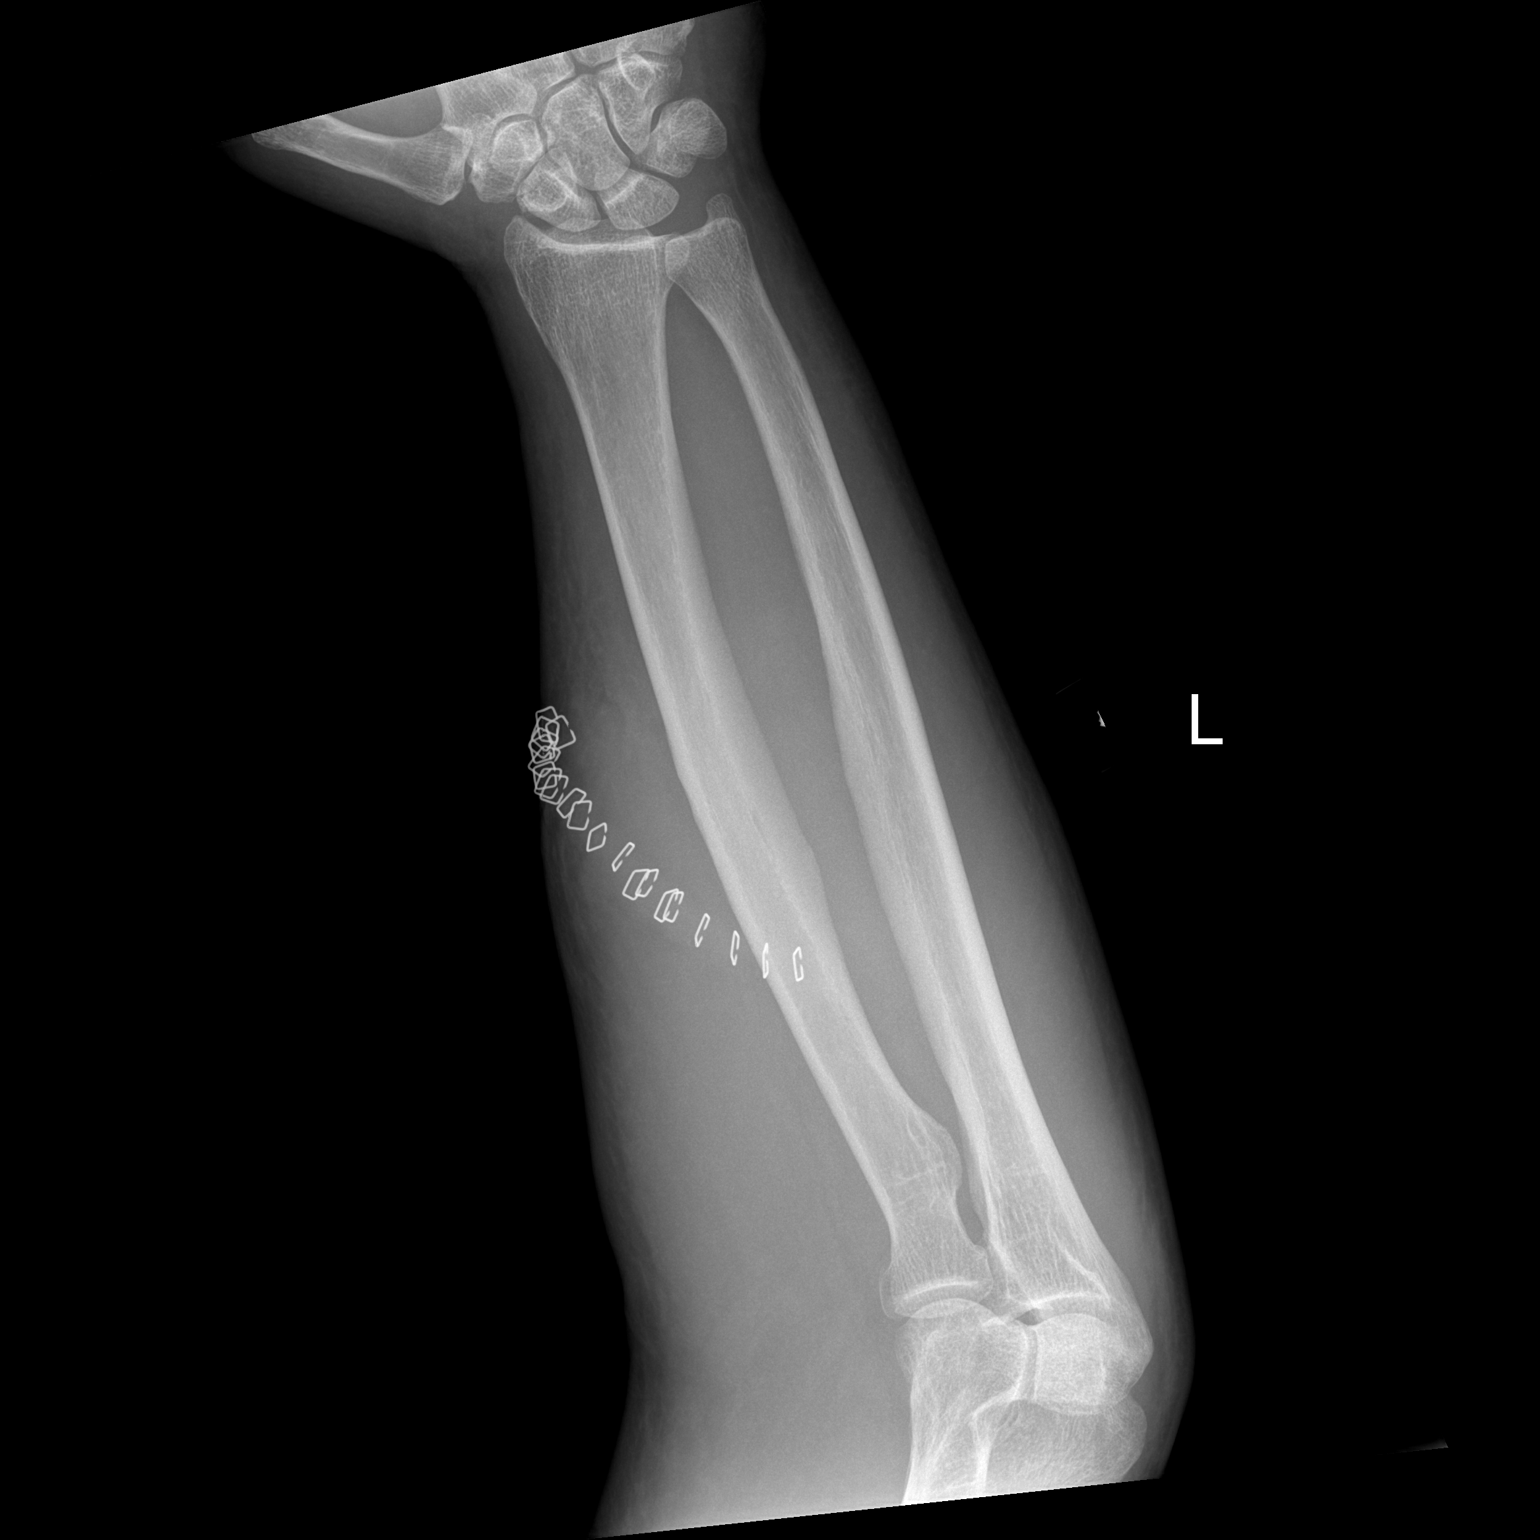

[x forearm lat left *]
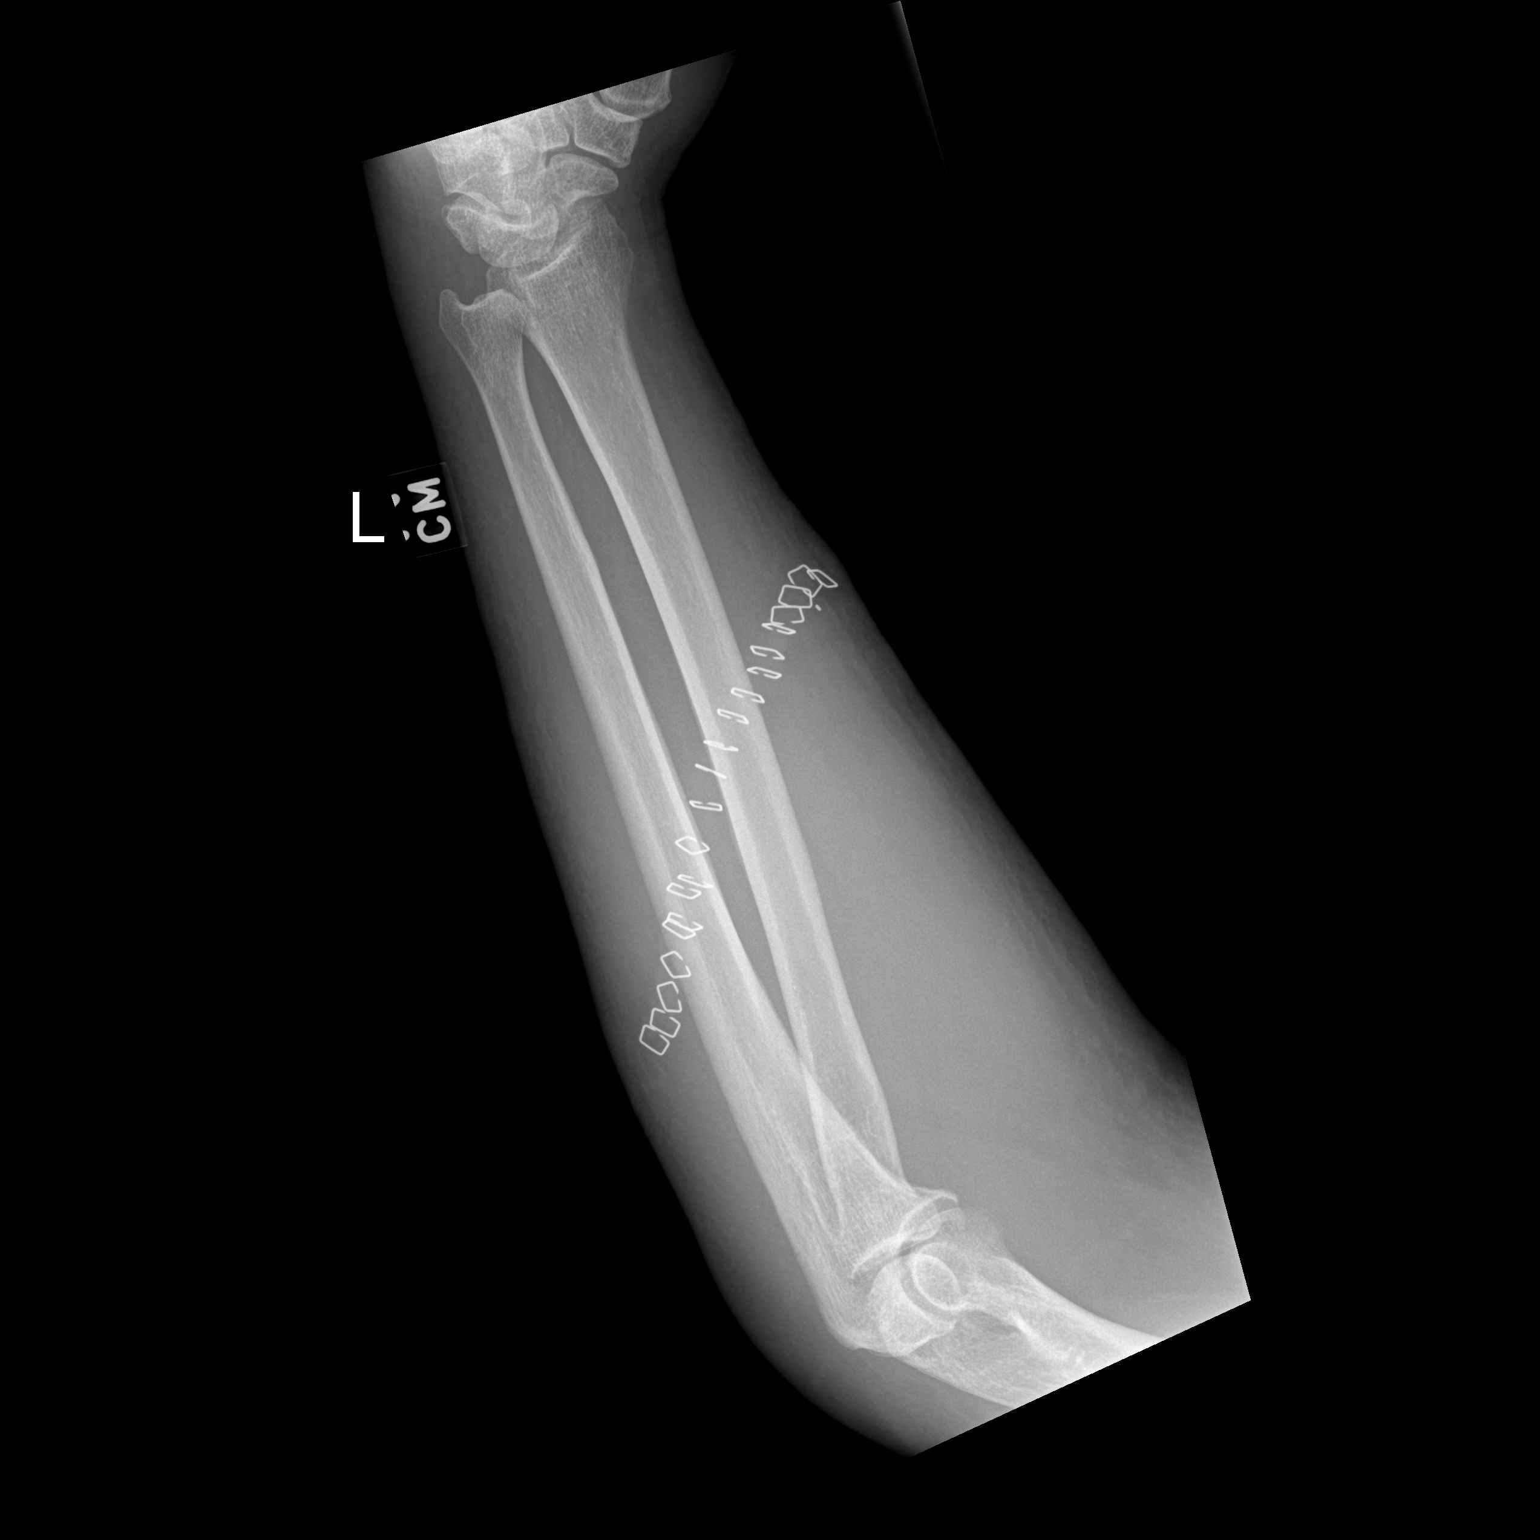

[2 of 2 positions shown; findings below may reference images not displayed]

FINDINGS: Soft tissue swelling is present.  No visible radiopaque
foreign body, fracture, or osseous reaction.
IMPRESSION: As above

## 2011-02-26 IMAGING — US US ABDOMEN COMPLETE
1 series · 13 of 25 positions shown · non-contrast
Comparison: 01/24/2009 CT

CLINICAL DATA: Hepatitis C.  Abnormal liver function test.

ABDOMINAL ULTRASOUND COMPLETE

[Series 1: us abdomen complete · 0.31mm/px · 13 of 90 slices shown]
[im 1/90]
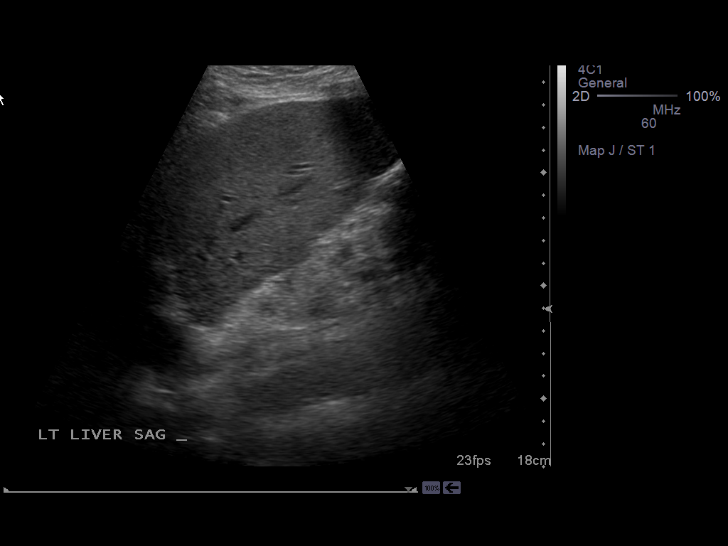
[im 8/90]
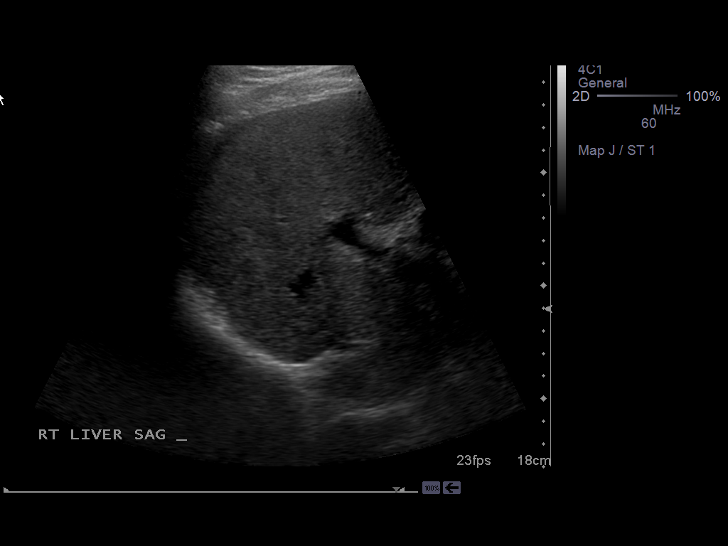
[im 15/90]
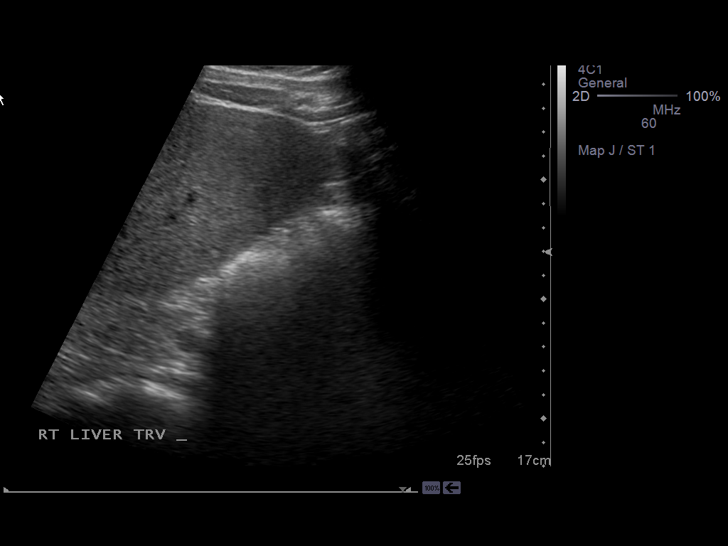
[im 23/90]
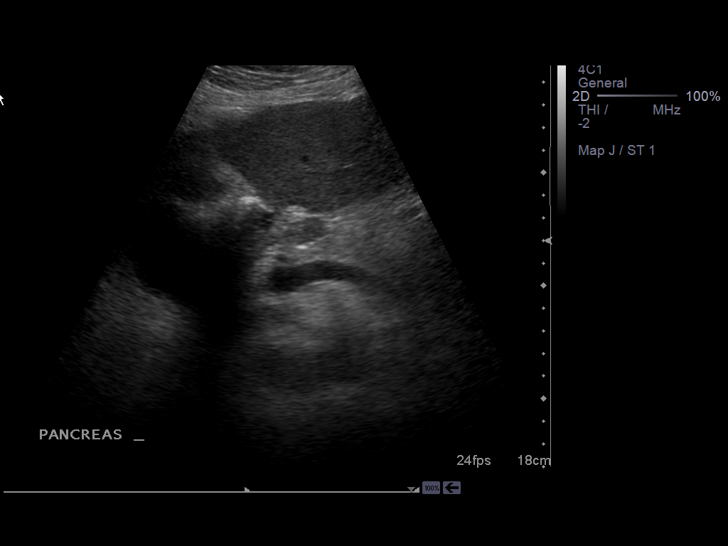
[im 30/90]
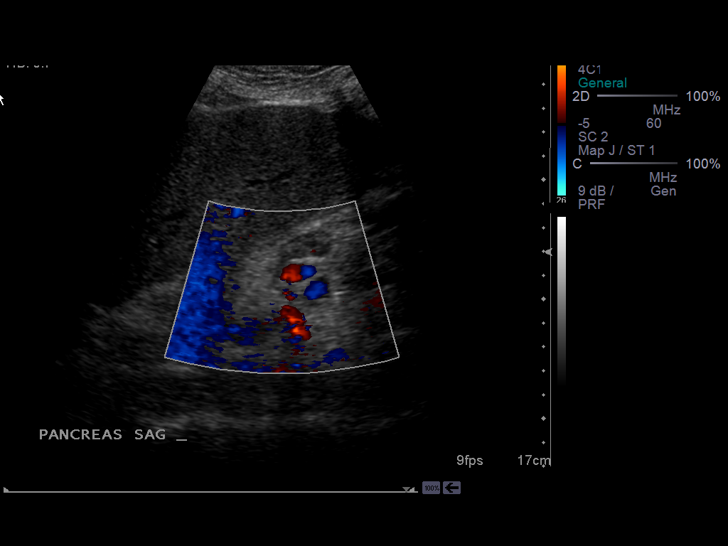
[im 38/90]
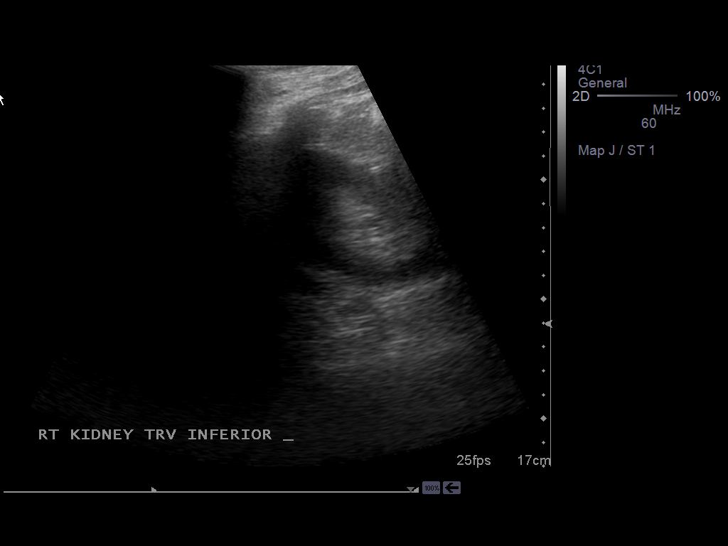
[im 45/90]
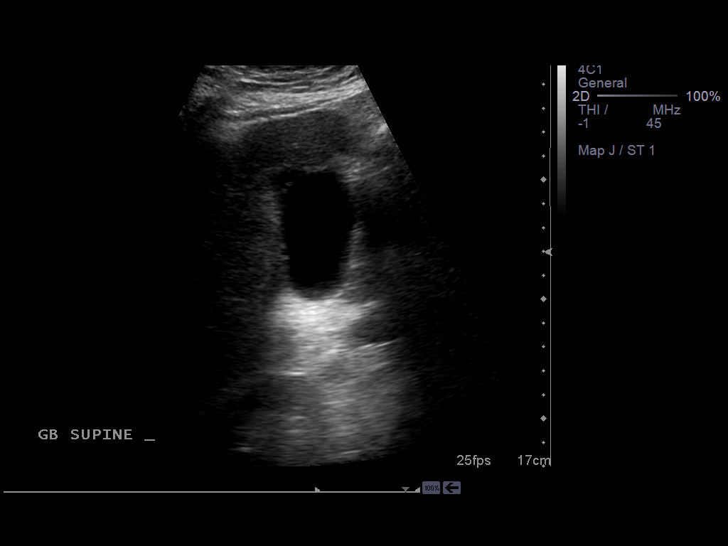
[im 52/90]
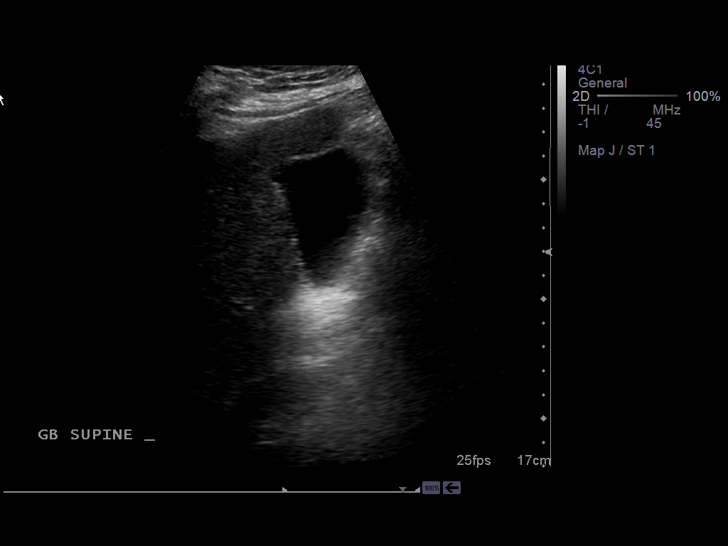
[im 60/90]
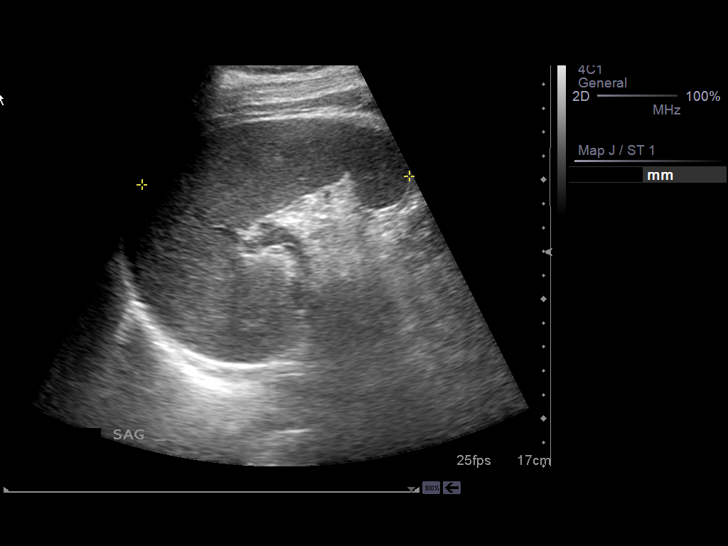
[im 67/90]
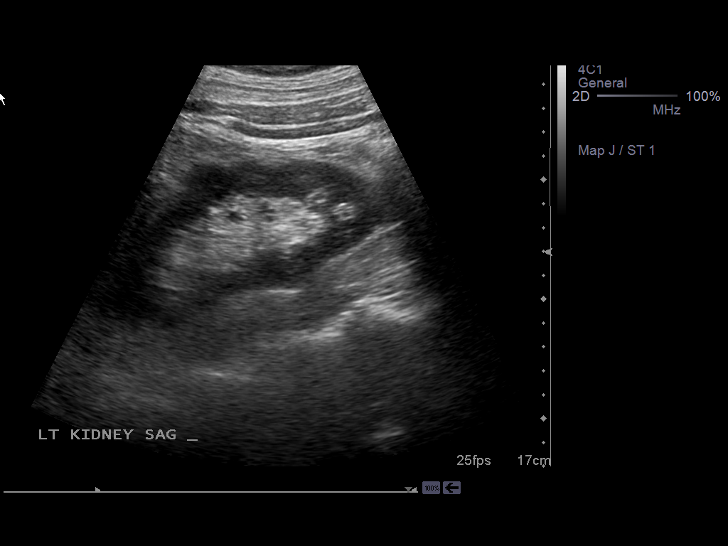
[im 75/90]
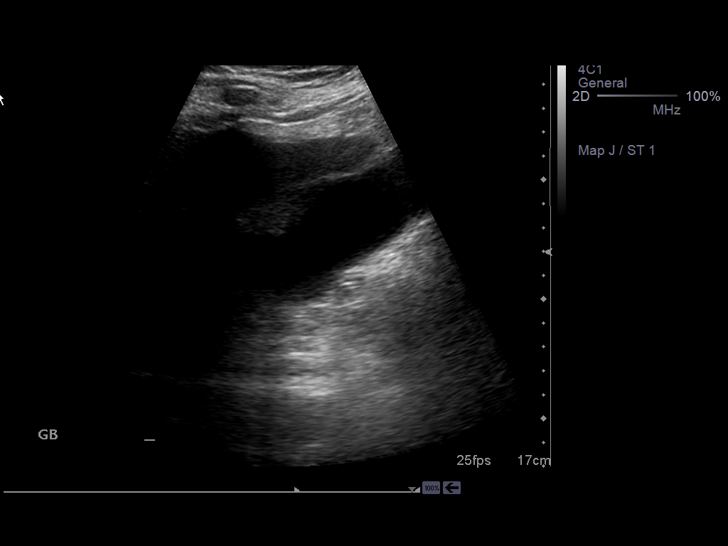
[im 82/90]
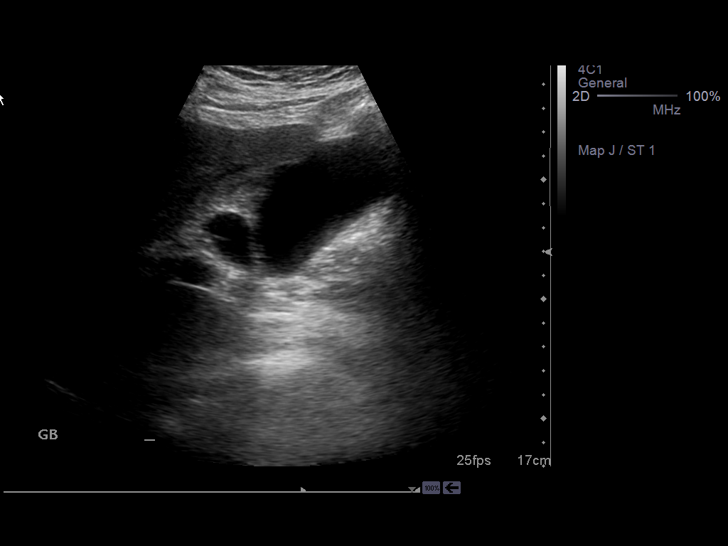
[im 90/90]
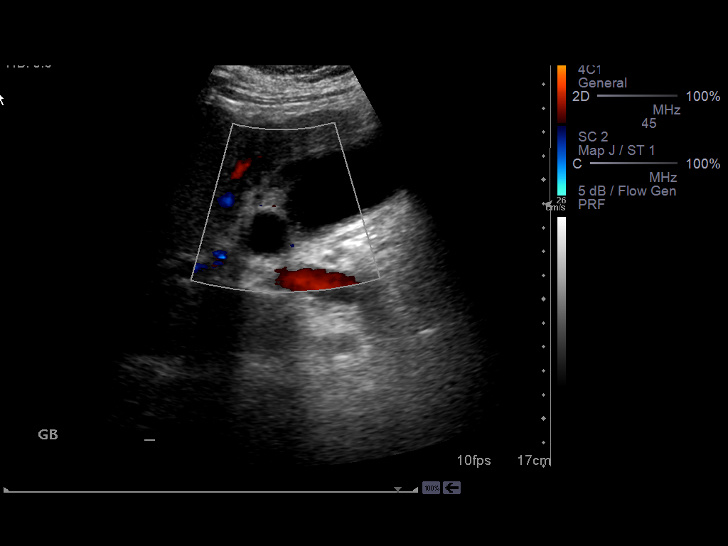

[13 of 25 positions shown; findings below may reference images not displayed]

FINDINGS: Gallbladder:  Mild gallbladder wall thickening is identified likely
related to hepatic dysfunction.  If there is clinical suspicion for
acute cholecystitis, consider nuclear medicine study.
There is no evidence of cholelithiasis.

Common bile duct: Within normal limits in caliber. 4.2 mm.

Liver:  No focal lesion identified.  Increased echogenicity o the
liver is compatible with fatty infiltration.  A slightly nodular
hepatic contour is compatible with cirrhosis.

Inferior vena cava:  Intrahepatic portion appears normal.

Pancreas:  Although entire pancreas not visualized due to bowel
gas, no abnormality identified.  A 1 x 2 cm peripancreatic lymph
node is identified in nonspecific.

Spleen:  Within normal limits in size and echogenicity.

Right kidney:  Normal in size and parenchymal echogenicity.  No
evidence of mass or hydronephrosis.

Left kidney:  Normal in size and parenchymal echogenicity.   No
evidence of mass or hydronephrosis.

Abdominal aorta:  No aneurysm identified.  The distal abdominal
aorta is not well visualized.
IMPRESSION: Cirrhosis with fatty infiltration of the liver.  No evidence of
focal hepatic lesions.

Mild gallbladder wall thickening - likely related to hepatic
dysfunction.  No evidence of cholelithiasis.

## 2011-02-26 IMAGING — CT CT EXTREM UP W/ CM*L*
3 series · 17 of 35 positions shown, 19 images · IV contrast (100 ML OMNI 300)
Comparison: Multiple prior plain films, last 02/01/2009.

CLINICAL DATA: Left forearm pain and cellulitis.

CT LEFT FOREARM WITH CONTRAST
TECHNIQUE: Multidetector CT imaging of the left forearm was
performed according to the standard protocol following intravenous
contrast administration. Multiplanar CT image reconstructions were
also generated.
Contrast: 100 ml Mmnipaque-G22.

[Series 3: recon 2: lt forearm, · axial · 0.38mm/px · z∈[+87,+362]mm · 8 of 261 slices shown, 10 images]
[im 21/261  soft-tissue]
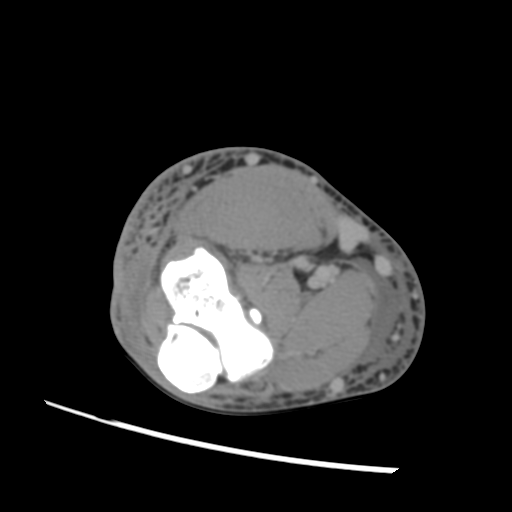
[im 21/261  bone]
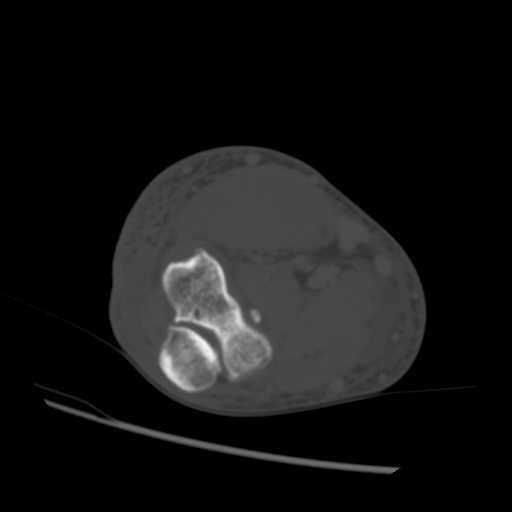
[im 61/261  bone]
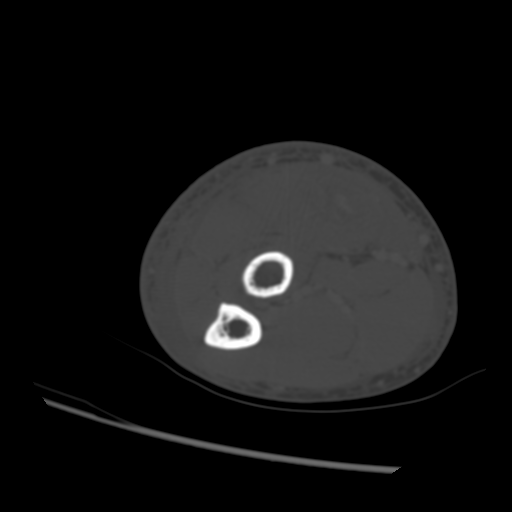
[im 81/261  bone]
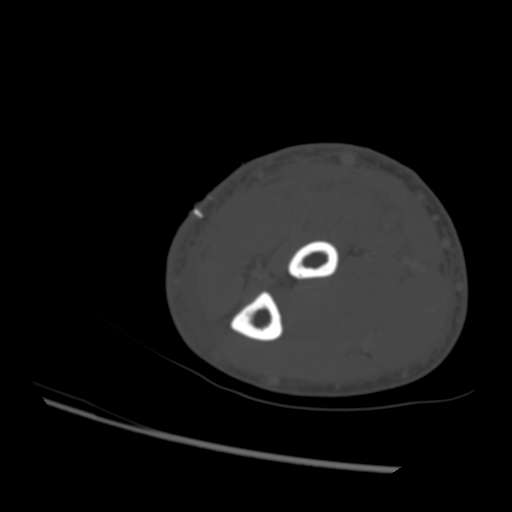
[im 121/261  bone]
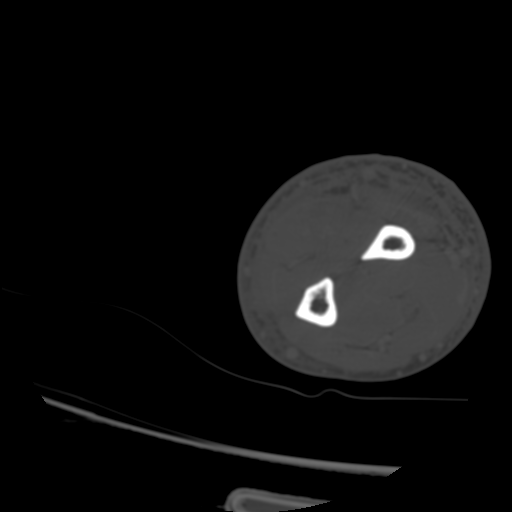
[im 141/261  soft-tissue]
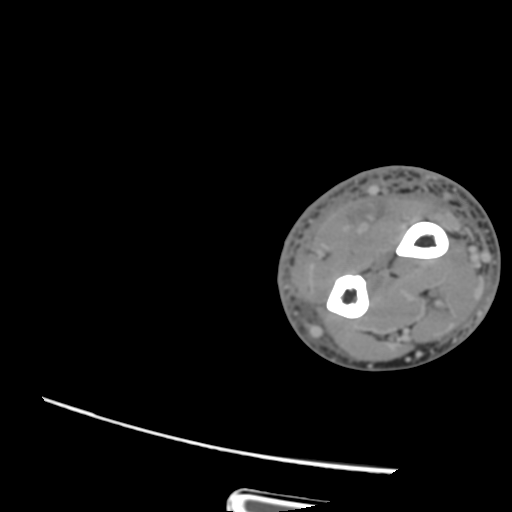
[im 141/261  bone]
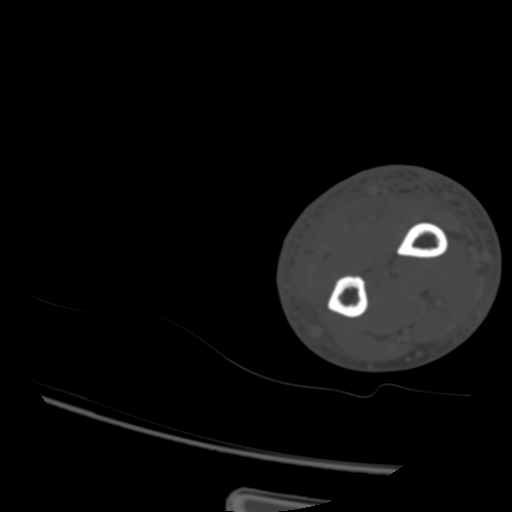
[im 181/261  bone]
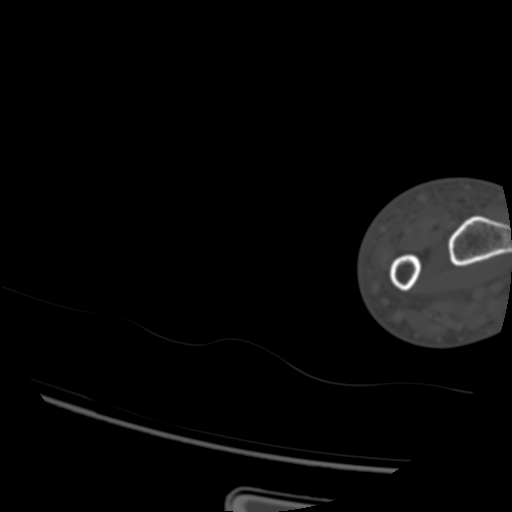
[im 201/261  bone]
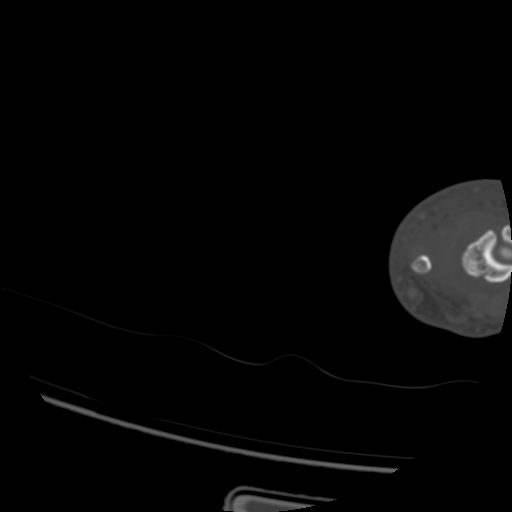
[im 241/261  bone]
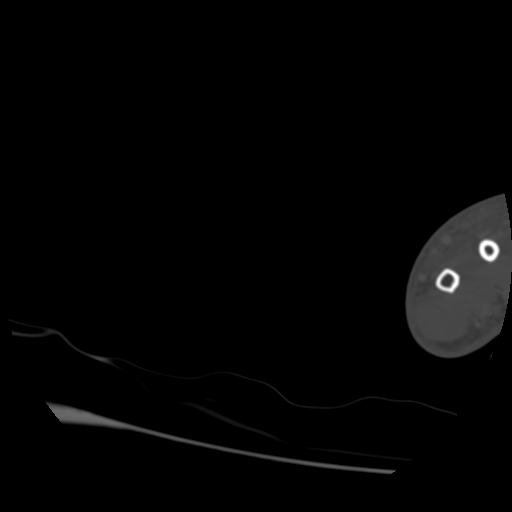

[Series 400: sagittals · sagittal · 0.65mm/px · 6 of 61 slices shown]
[im 21/61  bone]
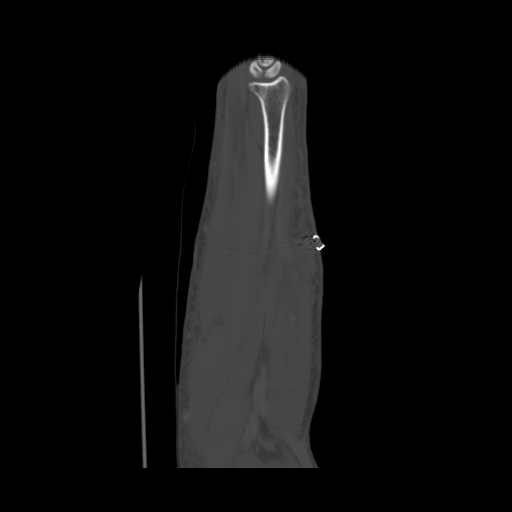
[im 26/61  bone]
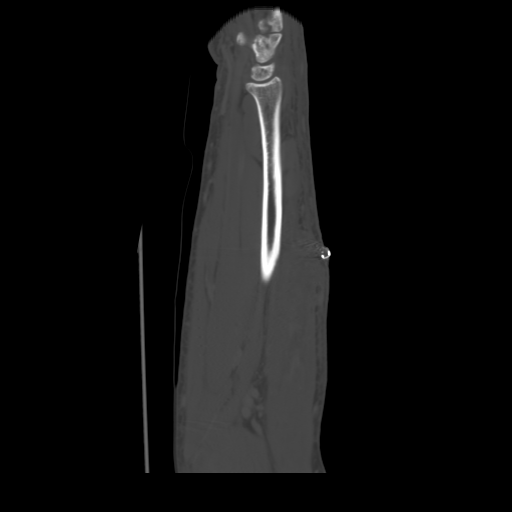
[im 31/61  bone]
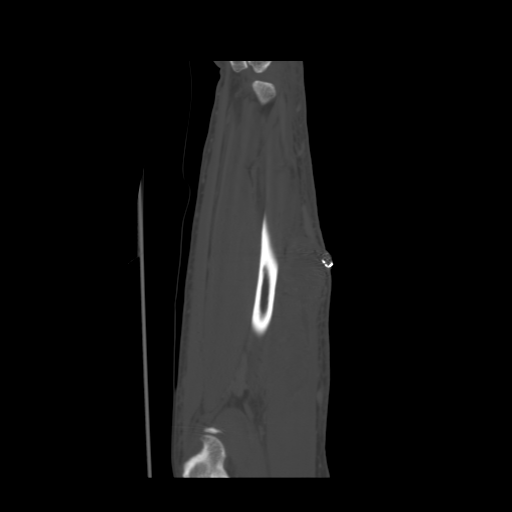
[im 36/61  bone]
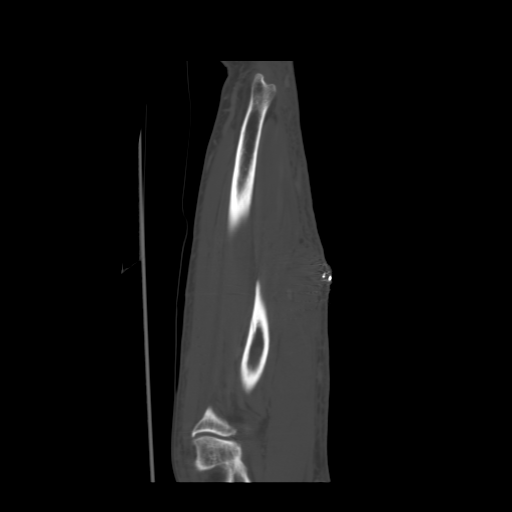
[im 41/61  bone]
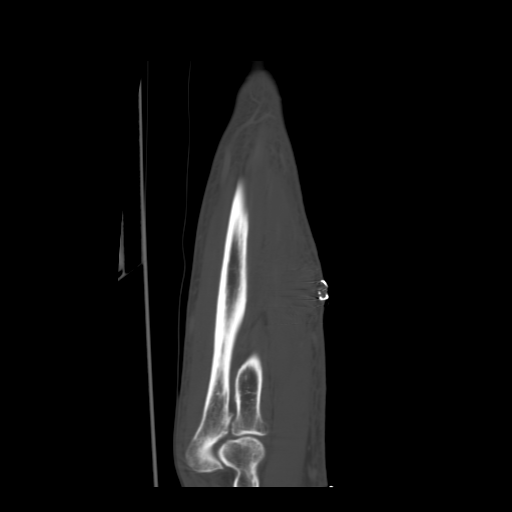
[im 43/61  soft-tissue]
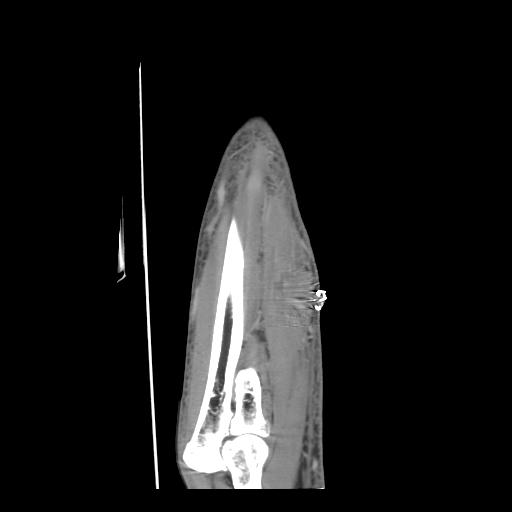

[Series 401: coronals · coronal · 0.65mm/px · 3 of 61 slices shown]
[im 13/61  bone]
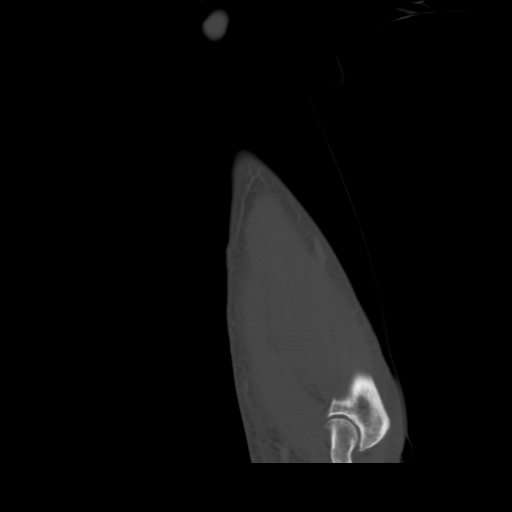
[im 25/61  bone]
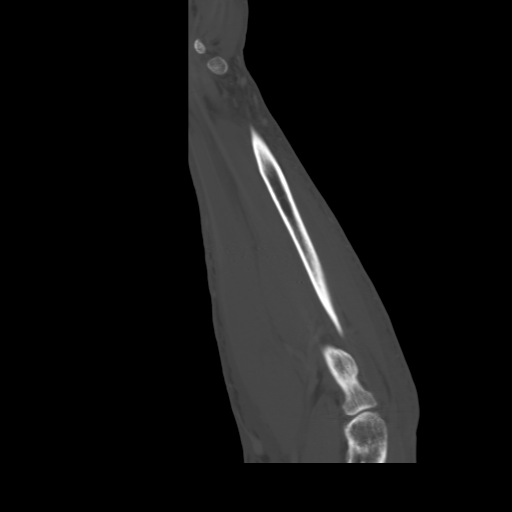
[im 37/61  bone]
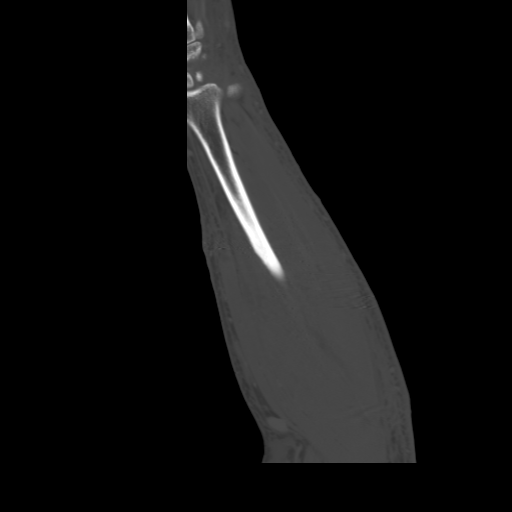

[17 of 35 positions shown; findings below may reference images not displayed]

FINDINGS: The patient has extensive subcutaneous edema about the
forearm.  Surgical staples are identified for closure of a
laceration along the radius.  On images 165-169, there is nodular
enhancement with appearances most suggestive of a post traumatic
pseudoaneurysm measuring approximately 1.8 cm cranial-caudal by
cm transverse by 1.4 cm AP.  This abnormality is located along the
posterior aspect of the radius deep to the surgical staples
approximately 10 cm below the radiocapitellar joint.

There is extensive muscular edema just deep to the brachial
radialis muscle and within and deep to the extensor carpi radialis
longus and extensor carpi radialis brevis consistent with myositis
and likely abscess formation. Discrete measurement of fluid
collection is difficult but the best visualized discrete collection
measures 1.6 cm AP by 4.2 cm transverse by 5.2 cm cranial-caudal.
This collection is centered approximately 10 cm distal to the
radiocapitellar joint line lying within and deep to the extensor
carpi radialis longus.  No bony destructive change to suggest
osteomyelitis is identified.
IMPRESSION: 1.  Abnormal enhance within the soft tissues of the distal forearm
as detailed above is highly suspicious for post-traumatic
pseudoaneurysm.  Ultrasound recommended for further evaluation.
2.  Extensive cellulitis and low attenuation collections within the
musculature as detailed above compatible with abscess formation.
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
## 2011-03-01 NOTE — Discharge Summary (Signed)
[name], [name] NO.:  [id number]   MEDICAL RECORD NO.:  [id number]          PATIENT TYPE:  IPS   LOCATION:  0501                          FACILITY:  [facility]   PHYSICIAN:  Geoffery Lyons, M.D.      DATE OF BIRTH:  1955-11-06   DATE OF ADMISSION:  11/01/2008  DATE OF DISCHARGE:  11/03/2008                               DISCHARGE SUMMARY   CHIEF COMPLAINT AND PRESENT ILLNESS:  This was the first admission to  [name] for this 55 year old married white male  presented to [facility] ED complaining of abdominal pain and vomiting  for 3 days.  Alcohol level was 251.  Endorsed he was having diffuse  abdominal pain, nausea, vomiting, hiccups, back pain.  He was requesting  detox from alcohol.  Had a history of hepatitis C, pancreatitis.  Has  been drinking every day for the past several weeks.   PAST PSYCHIATRIC HISTORY:  He apparently has an extensive history in the  [facility] for drugs and alcohol detox.  More recently RTS in  2008 and September 2009.  Recently discharged from [facility].  Alcoholic history as already stated, persistent use of persistent use of  alcohol.   MEDICAL HISTORY:  Hypertension, pancytopenia, dysphagia, hematemesis,  esophageal tear, hepatitis C, pancreatitis.  When he was discharged in  October from the medical unit he was on Ventolin four times a day,  aspirin 325 mg per day, Lovenox for DVT, Mucinex 600 mg twice a day,  Reglan 5 mg before meals and at bedtime, metoprolol 25 mg per day,  multivitamins, NTG as needed, Senokot at bedtime, thiamine.  He reports  he never filled his prescriptions.   PHYSICAL EXAMINATION:  Exam failed to show any acute findings.   LABORATORY WORK:  UDS negative for substances of abuse.  Initial alcohol  level was 251.  Hemoglobin 10.99, SGOT 74.   DIAGNOSES:  AXIS I: Alcohol dependence.  AXIS II: No diagnosis.  AXIS III:  History of hepatitis C, intractable hiccups,  hypertension,  pancreatitis, pancytopenia  AXIS IV: Moderate.  AXIS V:  On admission 35.  GAF in the last year 60.   COURSE IN THE HOSPITAL:  He was admitted.  We pursued detox with  Librium.  He was treated with Thorazine for his hiccups, he was given  some Neurontin for the anxiety as well as trazodone for sleep.  He  wanted to be discharged on the 17th.  We asked him to stay one day so we  could monitor his detox more closely.  On January 18th he was wanting to  go home.  He was in full contact with reality.  Endorsed no active  suicidal or homicidal ideas, no hallucinations or delusions.  He was  willing to pursue outpatient treatment.  Upon discharge he was denying  any active suicidal ideations, but he refused to take the discharge  instruction or the prescriptions, endorsed I don't want them they  don't work for me also refused follow-up appointments apparently has a  history of not following through.  So, we discharged from the hospital.   DISCHARGE DIAGNOSES:  AXIS I: Alcohol dependence status post alcohol  withdrawal  AXIS II:  No diagnosis.  AXIS III:  Hepatitis C by history.  Hiccups resolved.  Hypertension,  pancreatitis, pancytopenia.  AXIS IV: Moderate.  AXIS V:  On discharge 50.  Refused to take the prescriptions of  Neurontin, trazodone and Thorazine and refuse any follow-up  appointments.      Geoffery Lyons, M.D.  Electronically Signed     IL/MEDQ  D:  11/19/2008  T:  11/20/2008  Job:  [id number]
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
## 2011-03-01 NOTE — Discharge Summary (Signed)
[name], [name]                ACCOUNT NO.:  [id number]   MEDICAL RECORD NO.:  [id number]          PATIENT TYPE:  INP   LOCATION:  5501                         FACILITY:  [facility]   PHYSICIAN:  Herbie Saxon, MDDATE OF BIRTH:  1956-02-16   DATE OF ADMISSION:  02/01/2009  DATE OF DISCHARGE:  02/10/2009                               DISCHARGE SUMMARY   ADDENDUM   MEDICATIONS ON DISCHARGE:  1. Multivitamin 1 tablet daily.  2. Thiamine 100 mg daily.  3. Keflex 500 mg q.6 h. for 10 days.  4. Bactrim 800/160 mg twice daily for 10 more days.  5. Tylenol 650 mg q.6 h. p.r.n.  6. Percocet 5/325 one to two tablet q.6 h. p.r.n.  7. Thorazine 25 mg q.8 h. p.r.n. for hiccups.   FOLLOWUP:  Follow up will be with [facility] on Feb 16, 2009.  Follow up  with Trauma Surgery, Dr. Lindie Spruce on Feb 19, 2009. Currently, the patient is  declining follow up at the [facility].  If the patient consents,  he also needs to follow up with [facility] in the next 1 week.   ACTIVITY:  Increase slowly as tolerated.   DIET:  Low-sodium, heart healthy.   DISCHARGE CONDITION:  Stable.  The patient educated on the need for  compliance with his clinic visits.  Primary care physician also to  coordinate followup with Vascular Surgery with Dr. Myra Gianotti as needed.      Herbie Saxon, MD  Electronically Signed     MIO/MEDQ  D:  02/10/2009  T:  02/11/2009  Job:  [id number]
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
## 2011-03-01 NOTE — Discharge Summary (Signed)
NAMEBREYLEN, AGYEMAN NO.:  [id number]   MEDICAL RECORD NO.:  [id number]         PATIENT TYPE:  LINP   LOCATION:                               [facility]ACILITY:  [facility]   PHYSICIAN:  Elliot Cousin, M.D.    DATE OF BIRTH:  04/06/1956   DATE OF ADMISSION:  05/13/2007  DATE OF DISCHARGE:  05/14/2007                               DISCHARGE SUMMARY   DISCHARGE DIAGNOSES:  1. Abdominal pain thought to be secondary to acute on chronic      pancreatitis.  2. Acute alcohol intoxication.  3. Hypokalemia.  4. Anemia.  5. Thrombocytopenia.  6. Transaminitis/hepatitis thought to be secondary to alcohol abuse      and hepatitis C.  7. THE PATIENT LEFT AGAINST MEDICAL ADVICE.   MEDICATIONS:  None, as the patient left AMA.   CONSULTATIONS:  None.   PROCEDURES PERFORMED:  1. CT scan of the abdomen and pelvis on May 13, 2007.  The results      revealed question cirrhotic liver, stable upper abdominal      adenopathy, stable ventral hernia containing fat and multiple      vessels, and no acute upper abdominal process.  CT scan of the      pelvis revealed large and small bowel loops in the pelvis that were      normal, normal appearance of the bladder and distal ureters, no      pelvic mass, adenopathy, or free air.  Bones unremarkable.  2. Acute abdominal series on May 12, 2007.  The results revealed no      active lung disease and no obstruction or free air.   HISTORY OF PRESENT ILLNESS:  The patient is a 55 year old man with a  past medical history significant for alcohol abuse and chronic  pancreatitis, who presented to the emergency department on May 12, 2007  with a chief complaint of abdominal pain, nausea, vomiting, and loose  stools.  The patient reported drinking at least five to six 40-ounce  beers daily.  When he was initially evaluated by the physician in the  emergency department, his alcohol level was noted to be high at 348, and  his lipase level was elevated at  63.  The patient was therefore admitted  for further evaluation and management.   For additional details, please see the dictated history and physical.   HOSPITAL COURSE:  ABDOMINAL PAIN WITH A HISTORY OF ALCOHOLIC  PANCREATITIS, HEPATITIS C, AND ALCOHOLISM.  The patient was started on  IV fluids with D5-normal saline with potassium chloride and vitamins  added.  He was also started on the Ativan alcohol withdrawal protocol.  He was allowed to have clear liquids as tolerated.  His pain was managed  with as-needed Dilaudid and oxycodone.  For further evaluation, a CT  scan of the abdomen and pelvis was ordered.  The results were negative  for any acute abnormalities, although there was evidence of a possible  cirrhotic liver, upper abdominal adenopathy, and a stable ventral  hernia.  The patient's lab data were significant for a hemoglobin of 11,  platelet count of 114, and an urine drug screen positive for  benzodiazepines. His SGOT was 55 and his SGPT was 86.  His TSH was also  assessed and found to be mildly elevated at 6.616.  When the dictating  physician briefly followed up with the patient the same  morning of hospital admission, he was complaining about the diet.  Adjustments were made in his diet, hopefully for it to be tolerated by  the patient.  However, later in the day, he became disgruntled and  decided to leave against medical advice.      Elliot Cousin, M.D.  Electronically Signed     DF/MEDQ  D:  05/16/2007  T:  05/16/2007  Job:  [id number]
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
## 2011-03-01 NOTE — H&P (Signed)
NAMEDAYLAN, [name] NO.:  [id number]   MEDICAL RECORD NO.:  [id number]          PATIENT TYPE:  LINP   LOCATION:                               FACILITY:  [facility]   PHYSICIAN:  Della Goo, M.D. DATE OF BIRTH:  05-18-1956   DATE OF ADMISSION:  05/13/2007  DATE OF DISCHARGE:  05/14/2007                              HISTORY & PHYSICAL   This is an unassigned patient.   CHIEF COMPLAINT:  Epigastric abdominal pain.   HISTORY OF PRESENT ILLNESS:  This is a 55 year old male with a history  of alcoholism who is presenting to the emergency department.   Dictation ended at this point.      Della Goo, M.D.  Electronically Signed     HJ/MEDQ  D:  05/13/2007  T:  05/14/2007  Job:  [id number]
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
## 2011-03-01 NOTE — Discharge Summary (Signed)
[name], [name] NO.:  [id number]   MEDICAL RECORD NO.:  [id number]          PATIENT TYPE:  INP   LOCATION:  5729                         FACILITY:  [facility]   PHYSICIAN:  Altha Harm, MDDATE OF BIRTH:  Mar 16, 1956   DATE OF ADMISSION:  04/22/2007  DATE OF DISCHARGE:  04/25/2007                               DISCHARGE SUMMARY   DISCHARGE DISPOSITION:  Home.   DISCHARGE MEDICATIONS:  1. Carafate 1 gram by mouth 3 times a day.  2. Protonix 40 mg by mouth twice a day.  3. Thiamine 100 mg by mouth daily.  4. Folate 1 mg by mouth daily.  5. Librium 25 mg by mouth every night.  6. Vicodin 5/500 1 tab every 6 hours as needed.  7. New medication:  Lopressor 12.5 mg by mouth twice a day.   CONSULTANTS:  None.   PROCEDURES:  None.   DIAGNOSTIC STUDIES:  1. CT of abdomen and pelvis with contrast media.  Findings show a      previously described distal esophageal wall thickening less      prominent with remaining present with adjacent periesophageal      cardiophrenic and upper abdominal adenopathy, stable as per prior      studies.  Impression: Probable changes of early cirrhosis.  Recannulization of the  umbilical vein and it is notably compatible with portal venous  hypertension.  No free fluid, free air or adenopathy in the pelvis and  bowel gas unremarkable.  1. Two-view abdominal series which shows no obstruction free air.  Impression:  Mild bronchitic changes.   CODE STATUS:  Full code.   ALLERGIES:  No known drug allergies.   CHIEF COMPLAINT:  Abdominal pain.   HISTORY OF PRESENT ILLNESS:  Please see H&P dictated by Dr. Karilyn Cota for  details of the HPI.   HOSPITAL COURSE:  1. Mild pancreatitis.  The patient was admitted.  I gave him      intravenous fluids and bowel rest.  He was started no clear liquids      and his diet was advanced to a soft, low fat diet that he tolerated      without any difficulty. The patient had a mildly elevated  lipase on      admission and on the subsequent days even with consumption of food,      the patient had a decrease in his lipase and is normal range.  The      patient has been cautioned against continued alcohol use, however,      he states that he is not at the place where he wants to quit and      thus an expect to have recurrent bouts of pancreatitis.  2. Alcohol dependence.  The patient continues to drink alcohol on a      habitual basis.  While hospitalized, he has thus far shown no signs      of withdrawal and will be restarted on his Librium prior to      discharge.  3. Mild hypertension.  The patient does have stage I hypertension,  however, is not on any medications.  The patient has been started      on 12.5 of Lopressor by mouth twice a day both for his hypertension      and for prophylaxis against esophageal varices.  4. History of hepatitis C and early cirrhosis.  The patient has been      cautioned against continued alcohol use which he refuses to abstain      from at this time.  5. Tinea corporis.  The patient is started on clotrimazole topical      applied b.i.d. to the groin area.  6. Fever.  The patient had one episode of a fever as high as 101.2.      The patient is being observed and so far has shown no other signs      of fever.  No cultures are pending at this time.   PHYSICAL RESTRICTIONS:  None.   DIETARY RESTRICTIONS:  The patient should be on a low sodium, low fat  diet and has been advised to abstain from alcohol use.   FOLLOWUP:  The patient should follow up with his primary care doctor on  an as-needed basis.      Altha Harm, MD  Electronically Signed     MAM/MEDQ  D:  04/24/2007  T:  04/24/2007  Job:  [id number]
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
## 2011-03-01 NOTE — Op Note (Signed)
NAMEMARCELLAS, [name] NO.:  [id number]   MEDICAL RECORD NO.:  [id number]          PATIENT TYPE:  INP   LOCATION:  2918                         FACILITY:  [facility]   PHYSICIAN:  Shirley Friar, MDDATE OF BIRTH:  [birth year]   DATE OF PROCEDURE:  04/13/2007  DATE OF DISCHARGE:  04/13/2007                               OPERATIVE REPORT   PROCEDURE PERFORMED:  Upper endoscopy.   INDICATIONS:  Persistent vomiting, dysphagia, hematemesis, esophageal  thickening on CT scan.   MEDICATIONS:  Phenergan 25 mg IV, fentanyl 50 mcg IV, Versed 5 mg IV.   FINDINGS:  The endoscope was inserted and the oropharynx and esophagus  was intubated.  From 32 cm down to 38 cm there was a long wide  esophageal tear with blood clots noted at the proximal end.  The width  of the tear was approximately 1 cm.  No other mucosal abnormalities were  seen in the esophagus.  No active bleeding was noted.  The endoscope was  carefully advanced through this area into the stomach which was normal  in its entirety.  Retroflexion was done which revealed normal proximal  stomach.  The endoscope was straightened and advanced to the duodenal  bulb and second portion of the duodenum which were both normal.  The  endoscope was then withdrawn to confirm above findings.   ASSESSMENT:  1. Deep long esophageal tear likely due to persistent vomiting      (extensive 32 cm to 38 cm).  2. No active bleeding.   PLAN:  1. Chest and neck CT to evaluate for esophageal perforation.  2. Continue IV antibiotics  3. N.p.o.  4. IV Protonix.  5. Follow closely for any changes in hemodynamics.      Shirley Friar, MD  Electronically Signed     VCS/MEDQ  D:  04/13/2007  T:  04/14/2007  Job:  [id number]
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
## 2011-03-01 NOTE — Consult Note (Signed)
[name], [name]                ACCOUNT NO.:  [id number]   MEDICAL RECORD NO.:  [id number]          PATIENT TYPE:  INP   LOCATION:  3710                         FACILITY:  [facility]   PHYSICIAN:  [name], MD    DATE OF BIRTH:  Nov 20, 1955   DATE OF CONSULTATION:  08/07/2008  DATE OF DISCHARGE:                                 CONSULTATION   REASON FOR CONSULTATION:  Dysphagia.   This is an unassigned patient from [facility].   HISTORY OF PRESENT ILLNESS:  This is a 55 year old male with a past  medical history of esophageal stricture status post dilation in 2000,  status post esophageal tear in 2008, and history of hepatitis C who was  admitted to the hospital with complaints of chest pain.  The patient  states that he had a heavy type of sensation and it was persistent.  Because of this persistent pain, he presented to the emergency room for  further evaluation and treatment.  The patient does have a long history  of gastroesophageal reflux disease and in 1997 he states undergoing an  EGD with dilation; at that time he had a meat impaction.  The patient  was admitted to the hospital and ruled out for myocardial infarction.  Because of his persistent dysphagia symptoms, a GI consultation was  requested.   PAST MEDICAL HISTORY AND PAST SURGICAL HISTORY:  As stated above.   SOCIAL HISTORY:  Positive for alcohol abuse, smokes marijuana  occasionally, and uses other various illicit drugs.   ALLERGIES:  No known allergies.   FAMILY HISTORY:  Noncontributory.   MEDICATIONS:  1. Albuterol.  2. Lovenox.  3. Aspirin.  4. Ativan.  5. Guaifenesin.  6. Reglan.  7. Metoprolol.  8. Protonix.  9. Thiamine.   PHYSICAL EXAMINATION:  VITAL SIGNS:  Blood pressure is 155/82, heart  rate is 77, respirations 18, and temperature is 98.4.  GENERAL:  The patient is in no acute distress, alert and oriented.  HEENT:  Normocephalic, atraumatic.  Extraocular muscles intact.  NECK:  Supple.  No lymphadenopathy.  LUNGS:  Clear to auscultation bilaterally.  CARDIOVASCULAR:  Regular rate and rhythm.  ABDOMEN:  Flat, soft, nontender, and nondistended.  EXTREMITIES:  No clubbing, cyanosis, or edema.   LABORATORY VALUES:  White blood cell count is 3.4, hemoglobin 10.0, and  platelets at 111.  Sodium 136, potassium 4.0, chloride 103, CO2 25,  glucose 91, BUN is 6, and creatinine 0.5.   IMPRESSION:  1. Dysphagia.  2. Noncardiac chest pain.  3. Hepatitis C.   After evaluation of the patient and given his history of esophageal  dilation in the past, a repeat EGD is warranted at this time as the  patient does have chronic gastroesophageal reflux disease and he has  poor medical compliance.  He was treated last year for an upper  gastrointestinal bleed, and at that time Dr. Bosie Clos had performed an  EGD, and it was notable for a large 4-cm distal esophageal tear, but no  other abnormalities were identified, and there is no mention of any  strictures at  that time.   PLAN:  1. To perform an EGD with dilation.  2. To hold his Lovenox at this time.  3. To check his PT/PTT before the procedure is performed.      Jordan Hawks Elnoria Howard, MD  Electronically Signed     PDH/MEDQ  D:  08/07/2008  T:  08/08/2008  Job:  [id number]
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
## 2011-03-01 NOTE — Discharge Summary (Signed)
[name], [name]:  [id number]   MEDICAL RECORD NO.:  [id number]          PATIENT TYPE:  INP   LOCATION:  1509                         FACILITY:  [facility]   PHYSICIAN:  Zannie Cove, MD     DATE OF BIRTH:  Jan 21, 1956   DATE OF ADMISSION:  05/05/2009  DATE OF DISCHARGE:  05/08/2009                               DISCHARGE SUMMARY   PRIMARY CARE PHYSICIAN:  None.   ADMISSION DIAGNOSES:  1. Chronic upper gastrointestinal bleed.  2. Alcohol abuse.  3. Iron deficiency anemia.  4. Chronic pancreatitis.  5. Thrombocytopenia.  6. Hepatitis C.   DISCHARGE MEDICATIONS:  1. Prilosec 40 mg p.o. b.i.d.  2. Thiamine 100 mg once a day.  3. Multivitamin 1 tab once a day.   For admission details, please see the note dictated by Dr. Ninfa Linden on May 05, 2009.   HOSPITAL COURSE:  Chronic upper GI bleed.  [name] reported a 2-day  history of intermittent low grade hematemesis with occasional streaking  of his food contents and vomiting with blood.  His hemoglobin was noted  to be stable from prior admission, and he did not have any melena while  in the hospital.  He was seen by GI in consultation and thought that his  bleeding was likely secondary to esophagitis versus Mallory-Weiss tear  versus peptic ulcer disease and recommended PPI therapy b.i.d. and said  that he would not require an EGD unless he develops active hematemesis  and recommended elective esophageal dilatation for possible stricture  following 2 weeks of PPI therapy.  His vitals and hemoglobin have been  stable for the past 72 hours.  He has not had any further episodes of  bleeding since being in the hospital for the last 3 days and stable to  be discharged.   DISCHARGE CONDITION:  Stable.   DISCHARGE DIET:  Low sodium diet.   DISCHARGE ACTIVITIES:  As tolerated.   FOLLOWUP:  The patient is [facility] with [facility].  He  was given telephone numbers for the [facility] and  was seen and  assisted by the case worker as well.      Zannie Cove, MD  Electronically Signed     PJ/MEDQ  D:  05/07/2009  T:  05/07/2009  Job:  [id number]

## 2011-03-01 NOTE — Discharge Summary (Signed)
NAMEALBIE, ARIZPE NO.:  [id number]   MEDICAL RECORD NO.:  [id number]          PATIENT TYPE:  INP   LOCATION:  1501                         FACILITY:  [facility]   PHYSICIAN:  Hettie Holstein, D.O.    DATE OF BIRTH:  [birth year]   DATE OF ADMISSION:  03/19/2008  DATE OF DISCHARGE:  03/22/2008                               DISCHARGE SUMMARY   FINAL DIAGNOSIS:  Alcohol intoxication with delirium tremens.   SECONDARY DIAGNOSES:  1. Pancytopenia secondary to alcoholism.  2. History of hepatitis C.  3. Homelessness.   MEDICATIONS ON DISCHARGE:  1. HCTZ 25 mg daily.  2. Valium 5 mg twice daily, dispense #10.  3. Thiamine 100 mg daily.   DISPOSITION:  Patient was instructed to establish primary care through  [facility].  Case management was asked to assist in this matter.  Patient left prior to arranging this followup.   HISTORY OF PRESENT ILLNESS:  For full details, please refer to the  comprehensive history and physical as dictated by Dr. Lonia Blood;  however, briefly, Mr. Kadlec is a 55 year old homeless male who  presented secondary to having abdominal pain, nausea and vomiting.  He  was reportedly an excessive alcohol drinker and was intoxicated the  evening of coming in.  He denied any bright red blood per rectum,  melena, or hematemesis.  He reported the pain as an 8/10.  He was a poor  historian upon arrival, as he was intoxicated.   During the hospital course, as noted above, Mr. Mirsky was initiated on  alcohol detoxification protocol with the CIWA scale and repleted with  thiamine and folate.  His symptoms were managed.  He did quite well  without DTs.  He was hemodynamically stable, although hypertensive just  prior to discharge.  He was agreeable to initiating an antihypertensive  regimen.  He was discharged in medically stable condition.   Studies performed this admission revealed a sodium of 138, potassium  3.5, BUN 3, creatinine 0.73.  WBCs 3-4,  hemoglobin 10.5, and a platelet  count of 38,000.      Hettie Holstein, D.O.  Electronically Signed     ESS/MEDQ  D:  06/19/2008  T:  06/19/2008  Job:  [id number]
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
## 2011-03-01 NOTE — Consult Note (Signed)
NAMESHEDDRICK, LATTANZIO NO.:  [id number]   MEDICAL RECORD NO.:  [id number]          PATIENT TYPE:  INP   LOCATION:  2918                         FACILITY:  [facility]   PHYSICIAN:  Antonietta Breach, M.D.  DATE OF BIRTH:  04-07-1956   DATE OF CONSULTATION:  04/13/2007  DATE OF DISCHARGE:  04/13/2007                                 CONSULTATION   REQUESTING PHYSICIAN:  [facility].   REASON FOR CONSULTATION:  Assess capacity to leave against medical  advice.   HISTORY OF PRESENT ILLNESS:  Mr. Vic Esco is a 55 year old male who  was admitted to the [facility] on April 10, 2007. He had been admitted  for hematemesis, vomiting, and epigastric pain.   He had been drinking regularly at least four to five 40-ounce beers per  day   The patient was placed on a withdrawal protocol.  He was assessed by  endoscopy to have an esophageal tear.   By the date of the undersigned's consultation, he is oriented to all  spheres.  His thought process is normal.  He has not had any thoughts of  harming himself or others.  He has intact memory ability. He has a  normal interests. He states that he wants to leave the hospital so that  he can tend to his affairs   PAST PSYCHIATRIC HISTORY:  Mr. Mcdanel does have an extensive history of  alcohol dependence in the past as well as aggravation of his hepatitis C  with the alcohol.   MENTAL STATUS EXAM:  Mr. Sitzman is oriented to all spheres.  He is  alert.  His eye contact is good.  His speech is within normal limits.  Thought process is logical, coherent, goal directed.  No looseness of  associations.  Thought content:  No thoughts of harming self, no  thoughts of harming others. No delusions, no hallucinations.  Memory is  intact.  Insight is intact for his alcohol dependence.  However, he  declines any alcohol rehabilitation.  His judgment is intact.  He is  socially appropriate with the undersigned.   ASSESSMENT:  Alcohol  dependence with physiologic dependence   The patient does demonstrate the ability to make a consistent choice.  He can differentiate between his options and their associated risks  versus benefits.  He does appreciate the risks of withdrawal as well as  the other medical problems including his potential for renewed  intestinal bleeding. He can reason well.   Being fully aware of potential residual withdrawal and bleeding that  could be lethal, the patient still chooses to leave the hospital against  medical advise.   The undersigned gave him information about alcohol rehabilitation  programs; however, he declines. He does not meet criteria for forced  care   Mr. Heinlen does have the capacity to leave the hospital against medical  advice.      Antonietta Breach, M.D.  Electronically Signed     JW/MEDQ  D:  04/14/2007  T:  04/15/2007  Job:  [id number]   cc:   [facility]

## 2011-03-01 NOTE — Group Therapy Note (Signed)
[name], [name]                ACCOUNT NO.:  [id number]   MEDICAL RECORD NO.:  [id number]          PATIENT TYPE:  INP   LOCATION:  5501                         FACILITY:  [facility]   PHYSICIAN:  Herbie Saxon, MDDATE OF BIRTH:  1956/01/09                                 PROGRESS NOTE   DATE OF DISCHARGE:  To be determined.   MEDICATIONS:  Will be dictated on final discharge.   CONSULTATIONS:  Surgery, Dr. Janee Morn.  Vascular Surgery, Dr. Myra Gianotti.   RADIOLOGY:  The CT of the left upper extremity of 02/03/2009, showed a distal  forearm  posttraumatic pseudoaneurysm, extensive cellulitis and a query  abscess formation.  Ultrasound of the abdomen of 02/03/2009 shows mild  gallbladder thickening, likely related to hepatic dysfunction.  There is  cirrhosis with fatty infiltration of the liver.  X-ray of the left  forearm of 02/01/2009 shows soft tissue swelling, no foreign body  fracture.   DISCHARGE DIAGNOSES:  1. Left forearm cellulitis wound and pseudoaneurysm.  2. History of ongoing alcohol abuse.  3. Homelessness.  4. Poor medication compliance.  5. History of chronic hepatitis C.  6. History of gastroesophageal reflux disease.  7. Liver cirrhosis.  8. History of hiatal hernia.  9. History of chronic pancreatitis.  10.History of chronic pan cytopenia.  11.History of depression.  12.Hypertension, stable.  13.Chronic thrombocytopenia.   HOSPITAL COURSE:  This 55 year old Caucasian male presented to the emergency room with  left forearm worsening pain and swelling.  The patient was admitted the  previous week to [facility] and left AMA.  The left forearm laceration  had been sutured in the emergency room and he was transfused blood at  that time.  On presentation, he was also noted to be having chronic  continuous hiccups.  He was admitted and started on IV fluids entirely  for the multivitamins and started on detox protocol.  MRI was not done  as he had staph in  the wound.  Surgery was consulted and they did  explore the wound and have been following wound care on a daily basis.  The patient continually is noncompliant, acutely wanting to discharge  against medical advise at least three occasions now.  Has been counseled  on the risk of doing this.  Vascular Surgery,  Dr. Myra Gianotti, was  consulted because of the pseudoaneurysm discovered, and at present is  not considered surgical treatment because the pseudoaneurysm is a  posterior branch of the interosseous artery.  It is recommended to  repeat ultrasound in the next two weeks.  He believes it is  spontaneously thrombosed because of the small artery.  The patient has  been on IV vancomycin and Zosyn for a week, and this has been switched  with p.o. Bactrim.  Culture report so far negative.  Plan will be to  follow the surgery recommendations.  The patient has been extensively  educated on the risks of discharging against medical advise.   PHYSICAL EXAMINATION:  GENERAL:  On examination today, his mental exam is not in acute  distress.  VITAL SIGNS:  Temperature 99, pulse 78,  respirations 20, blood pressure  125/71, saturating 94% on room air.  HEENT:  Pupils are reactive to light and accommodation.  Still not  jaundiced.  NECK:  Supple.  CHEST:  Clinically clear.  HEART:  Sounds 1 and 2 regular rate and rhythm.  ABDOMEN:  Benign.  EXTREMITIES:  He has a left forearm wound which is neatly dressed with  no purulent drainage or bleeding currently.  Left forearm erythema is  reduced.  Peripheral pulses are present.   CURRENT LABORATORY DATA:  On 02/04/2009, WBC 4, hematocrit 28, platelet count 94.  Chemistry  02/04/2009:  Sodium 135, potassium 4.0, chloride 105, bicarbonate 25,  glucose 97, BUN 4, creatinine 0.6.   FINAL DISCHARGE SUMMARY:  Will be dictated when the patient is eventually discharged.      Herbie Saxon, MD  Electronically Signed     MIO/MEDQ  D:  02/09/2009   T:  02/09/2009  Job:  [id number]
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
## 2011-03-01 NOTE — H&P (Signed)
[name], [name] NO.:  [id number]   MEDICAL RECORD NO.:  [id number]          PATIENT TYPE:  INP   LOCATION:  1413                         FACILITY:  [facility]   PHYSICIAN:  Della Goo, M.D. DATE OF BIRTH:  [birth year]   DATE OF ADMISSION:  05/13/2007  DATE OF DISCHARGE:                              HISTORY & PHYSICAL   Please disregard the previous dictation because it was interrupted.   This is an unassigned patient.   CHIEF COMPLAINT:  Epigastric abdominal pain.   HISTORY OF PRESENT ILLNESS:  This is a 55 year old male with a history  of alcohol abuse presenting to the emergency department secondary to  increased epigastric abdominal pain.  He reports having symptoms for  weeks but was worse today.  He reports having nausea, vomiting and  chronic loose stools.  He reports drinking five to six 40s a day.  His  alcohol level on presentation was 348.  A lipase level was also checked  and was found to be 63.  The patient does have a history of  pancreatitis, hepatitis C and alcoholism.   PAST SURGICAL HISTORY:  History of four ventral hernia repairs.   MEDICATIONS:  None.   ALLERGIES:  NO KNOWN DRUG ALLERGIES.   SOCIAL HISTORY:  Positive alcohol abuse and negative tobacco usage and  denies any illicit drug usage.   FAMILY HISTORY:  Noncontributory.   REVIEW OF SYSTEMS:  Pertinent as mentioned above.   PHYSICAL EXAMINATION:  GENERAL APPEARANCE:  A 55 year old obese male in  discomfort but no acute distress.  VITAL SIGNS:  Temperature 98.4, blood pressure 174/99, heart rate 104,  respirations 20, O2 saturations 96%.  HEENT:  Normocephalic, atraumatic.  Pupils equally round and reactive to  light.  Extraocular muscles are intact.  Funduscopic benign.  Oropharynx  clear.  NECK:  Supple, full range of motion.  No thyromegaly, adenopathy,  jugular venous distension.  CARDIOVASCULAR:  Mild tachycardia.  Regular rhythm.  No murmurs, gallops  or rubs.  LUNGS:  Clear to auscultation bilaterally.  ABDOMEN:  Positive bowel sounds, soft, nontender, nondistended.  No  hepatosplenomegaly.  No rebound, no guarding.  EXTREMITIES:  Without cyanosis, clubbing or edema.  SKIN:  Multiple tattoos upper extremities.  NEUROLOGIC:  The patient is alert and oriented x3.  There are no focal  deficits.   LABORATORY STUDIES:  White blood cell count 4.8, hemoglobin 11,  hematocrit 32.7, MCV 88.5, platelets 114, neutrophils 38% lymphocytes  45%.  Sodium 144, potassium 3.4, chloride 112, carbon dioxide 25, BUN 2,  creatinine 0.77, glucose 117, lipase 63.  Alcohol level 348.   Abdominal x-rays negative for free air, ileus or obstruction.   ASSESSMENT:  A 55 year old male being admitted with  1. Abdominal pain.  2. Chronic pancreatitis.  3. Alcohol intoxication.  4. Mild hypokalemia.  5. Anemia.  6. Thrombocytopenia.   PLAN:  The patient will be admitted to telemetry area for cardiac  monitoring.  He will be placed on a clear liquid diet for now.  IV  antiemetics and IV pain therapy has been ordered p.r.n.  The  patient  will placed on IV fluids.  He will also be placed on the alcohol  withdrawal protocol when he begins to have symptoms or when his alcohol  level is less than 5.  A urine drug screen has also been ordered and a  CT of the abdomen and pelvis has been ordered.  The patient has been  counseled on alcohol abuse and the consequences.      Della Goo, M.D.  Electronically Signed     HJ/MEDQ  D:  05/13/2007  T:  05/14/2007  Job:  [id number]

## 2011-03-01 NOTE — H&P (Signed)
[name], SAINDON NO.:  [id number]   MEDICAL RECORD NO.:  [id number]          PATIENT TYPE:  IPS   LOCATION:  0403                          FACILITY:  [facility]   PHYSICIAN:  Anselm Jungling, MD  DATE OF BIRTH:  04/10/1956   DATE OF ADMISSION:  01/01/2009  DATE OF DISCHARGE:                       PSYCHIATRIC ADMISSION ASSESSMENT   This is a 55 year old male voluntarily admitted on January 01, 2009.   HISTORY OF PRESENT ILLNESS:  The patient is here for alcohol use, has  been drinking four 40 ounce beers with his last use yesterday.  Was  endorsing depression but no suicidal thoughts at this time.  He is  currently homeless and he has medical problems and he has poor social  support.   PAST PSYCHIATRIC HISTORY:  Appears to be the patient's first admission,  unable to locate any other records.   SOCIAL HISTORY:  A 55 year old male who is currently homeless and  apparently has been living in the building for about a year.   FAMILY HISTORY:  None.   ALCOHOL AND DRUG HISTORY:  The patient has been drinking four 40 ounce  beers.  Denies any other substance use.   PRIMARY CARE Cindy Fullman:  None.   MEDICAL PROBLEMS:  Are history of hepatitis C.   MEDICATIONS:  None prior to arrival.   DRUG ALLERGIES:  No known allergies.   PHYSICAL EXAM:  GENERAL:  This is a middle-aged male who was fully  assessed at [facility] emergency department.  Physical exam was  reviewed with no significant findings.  The patient did receive Ativan  and Protonix.  The patient does have multiple tattoos noted and a healed  cross to his forehead.  VITAL SIGNS:  His temperature is 97.8, 85 heart rate, 18 respirations,  blood pressure is 182/80.  He is 5 feet 9 inches tall and 179 pounds.   LABORATORY DATA:  Shows urinalysis is negative.  Hemoglobin of 10.9,  hematocrit of 34.4, platelet count is low at 144, SGOT is mildly  elevated 85.  Urine drug screen is negative.  Alcohol level is  302.   MENTAL STATUS EXAM:  The patient is in the bed.  He is very sleepy but  did arouse to his name being called and offered some information.  Speech is clear, normal pace and tone.  The patient's mood is neutral.  He complains of being somewhat tired.  Affect is appropriate.  Thought  processes are coherent and goal directed.  No evidence of any psychotic  symptoms.  No delusional statements.  Cognitive function intact.  His  memory appears to be good.  Judgment is fair.  Poor impulse control  related to alcohol use.   AXIS I:  Alcohol dependence, substance induced mood disorder.  AXIS II:  Deferred.  AXIS III:  Hepatitis C.  AXIS IV:  Problems with housing, poor social support and chronic  substance use.  AXIS V:  Current is 35-40.   PLAN:  To contract for safety.  Stabilize mood and thinking.  The  patient will be placed on Librium protocol.  Will  monitor withdrawal  symptoms.  Will continue to assess his other comorbidities, identify his  support group.  Case manager will look at any rehab programs available  to the patient and assess living situation.  The patient will be placed  in the red group.  Tentative length of stay at this time is 3-5 days.      Landry Corporal, N.P.      Anselm Jungling, MD  Electronically Signed    JO/MEDQ  D:  01/02/2009  T:  01/02/2009  Job:  [id number]

## 2011-03-01 NOTE — Discharge Summary (Signed)
[name], WINT NO.:  [id number]   MEDICAL RECORD NO.:  [id number]          PATIENT TYPE:  IPS   LOCATION:  0501                          FACILITY:  [facility]   PHYSICIAN:  Geoffery Lyons, M.D.      DATE OF BIRTH:  08-29-1956   DATE OF ADMISSION:  12/08/2008  DATE OF DISCHARGE:  12/09/2008                               DISCHARGE SUMMARY   CHIEF COMPLAINT AND PRESENT ILLNESS:  This was the first admission to  [facility] for this 55 year old male who came to  emergency room claiming that he was suicidal and tired of living.  The  only way that he could be helped was to give him a gun to blow his  brains out.  Constantly expressed being constantly in pain, and admits  that he was noncompliant with medications.  Endorsed he had been  drinking all his life.  Claimed he drinks to deal with stressors,  wanting to be admitted.   PAST PSYCHIATRIC HISTORY:  Has been admitted to [facility],  [facility], [place] on no medications.   PHYSICAL EXAMINATION:  Failed to show any acute findings.   LABORATORY WORKUP:  TSH 1.367.  Magnesium 1.9.   Upon this assessment alert, endorsing wanted to be admitted overnight.  Did endorse that he was drinking to deal with the stress.  Endorsed  being in pain.   ADMITTING DIAGNOSES:  Axis I:  Alcohol dependence, depressed disorder  not otherwise specified.  Axis II:  No diagnosis.  Axis III:  No diagnosis.  Axis IV:  Moderate.  Axis V:  Upon admission 35, highest global assessment of functioning in  the last year 50.   COURSE IN THE HOSPITAL:  Was admitted.  He was started on Librium detox  February 23.  As already stated 55 year old male alcohol-dependent  presentation very similar to the last admission overnight expecting to  be discharged in the morning.  No real commitment to abstinence.  No  suicidal or homicidal ideas, no hallucinations or delusions.  No overt  withdrawal.  Requesting to be discharged.   No commitment criteria.  Did  not want any follow-up, just wanting to sleep in the unit and be  discharged in the morning.  We went ahead and discharged to outpatient  follow-up.   DISCHARGE DIAGNOSES:  Axis I:  Alcohol dependence.  Axis II:  No diagnosis.  Axis III:  No diagnosis.  Axis IV:  Moderate.  Axis V:  Upon discharge 50-55.   Discharged on no medication.  Encouraged to follow up at [facility].      Geoffery Lyons, M.D.  Electronically Signed     IL/MEDQ  D:  12/30/2008  T:  12/31/2008  Job:  [id number]
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
## 2011-03-01 NOTE — Discharge Summary (Signed)
[name], [name]                ACCOUNT NO.:  [id number]   MEDICAL RECORD NO.:  [id number]          PATIENT TYPE:  INP   LOCATION:  1421                         [facility]ACILITY[facility]  [facility]   PHYSICIAN:  Herbie Saxon, MDDATE OF BIRTH:  10-06-1956   DATE OF ADMISSION:  04/17/2007  DATE OF DISCHARGE:  04/20/2007                               DISCHARGE SUMMARY   DISCHARGE DIAGNOSIS:  Esophageal  tear. Chronic alcohol and tobacco  abuse.  Liver cirrhosis.  Hepatitis C positive.  Anemia.  Dysphagia.  Possible esophageal stricture ring.  Elevated liver function tests.  History of GI bleed, Anemia.   CONSULTATIONS:  Wilhemina Bonito. Marina Goodell, MD,  of Gastroenterology.   RADIOLOGY.:  CT of the chest shows no evidence of esophageal  perforation.  Peri portal and celiac adenopathy, distal esophageal wall  thickening, query tumor with metastases.  Question of hepatomegaly.   HOSPITAL COURSE:  This 55 year old male was recently admitted and had  endoscopy done on 04/13/2007 by Dr. Bosie Clos for GI bleed.  Re-presented  to the emergency room because of hematemesis.  He had earlier been  discharged Against Medical Advice, refusing CT chest to rule out  esophageal perforation.  The patient has been drinking heavily for the  past three days prior to re-presenting.  The patient is homeless and  gastroenterologists were reconsulted and saw the patient and reviewed  the CT chest.  They are planning to re-scope him with endoscopy in two  to three weeks.  He will continue with proton pump inhibitor and  Carafate with anti-emetic.  He will continue to abstain from alcohol.  He is being discharged in stable condition on 04/20/2007, follow up with  Dr. Marina Goodell as an outpatient.  Marland Kitchen   DIET:  Cardiac.   ACTIVITY:  As tolerated.   DISCHARGE MEDICATIONS:  1. Protonix 40 mg p.o. b.i.d.  2. thiamine100 mg p.o. daily  3. librium 25 mg p.o. b.i.d. for 3 days  4. Carafate 1 gram t.i.d.  5. Vicodin 5/500 one to two every 6  hours p.r.n.    On examination today he is elderly man in no acute distress.  Temperature 98, pulse 66, respiratory rate 84, blood pressure 133/76.  His mental status is otherwise stable clinically  Oropharynx clear.  Mucous membranes are moist.  There is no lymphadenopathy in the neck.  No evidence of JVD.  Chest is clear .  Abdomen is benign.  Peripheral  pulses present, no pedal edema.  No asterixis.   LABORATORY DATA:  Showed AST of 62, ALT of 39, total bilirubin is 1.3,  direct bilirubin 0.4.  WBC is 4, hematocrit 29, platelet count is 139.  Alpha fetoprotein was 4.9, within normal range.   DISCHARGE PLAN:  The patient verbalized understanding.      Herbie Saxon, MD  Electronically Signed     MIO/MEDQ  D:  04/20/2007  T:  04/20/2007  Job:  [id number]
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
## 2011-03-01 NOTE — Discharge Summary (Signed)
NAMETRUE, GARCIAMARTINEZ NO.:  [id number]   MEDICAL RECORD NO.:  [id number]          PATIENT TYPE:  INP   LOCATION:  1501                         FACILITY:  [facility]   PHYSICIAN:  Eduard Clos, MDDATE OF BIRTH:  03/21/1956   DATE OF ADMISSION:  12/21/2008  DATE OF DISCHARGE:  12/25/2008                               DISCHARGE SUMMARY   Discharge summary for December 25, 2008 when the patient left AGAINST  MEDICAL ADVICE.   COURSE IN THE HOSPITAL:  A 55 year old male with a known history of  chronic alcoholism, history of hepatitis C.  Presented with complaints  of vomiting blood.  On admission, the patient was placed on IV Protonix  drip.  GI was consulted, and as per gastroenterologist, Dr. Arlyce Dice, the  patient had an EGD which showed severe ulcerative esophagitis with fresh  clot and blood overlying it.  Epinephrine injections were given, and  bleeding was arrested and there was concern for esophageal ulcer with a  tear, and barium swallow was done which did not show any leak.  The  patient's hemoglobin did fall lower, and at that time transfusion of  blood was ordered, but the patient left against medical advice.   PERTINENT LABORATORIES ON ADMISSION:  1. The patient had a hemoglobin of 9.5 and hematocrit of 28.5.  2. Blood transfusion.   PROCEDURES DONE:  1. The patient had EGD on December 23, 2008 which showed severe ulcerative      esophagitis with fresh clot and blood overlying area.  Had 40 cm of      esophagus.  2. The patient also underwent barium swallow which showed negative for      any esophageal leak.   FINAL DIAGNOSIS:  1. Acute gastrointestinal bleed secondary to severe ulcerative      esophagitis.  2. Chronic alcohol abuse.  3. History of hepatitis C.  4. Mild transaminitis secondary to EtOH and hepatitis C and steatosis.   PLAN:  The patient left against medical advice before any definite plans  could be made.      Eduard Clos,  MD  Electronically Signed     ANK/MEDQ  D:  02/11/2009  T:  02/11/2009  Job:  [id number]

## 2011-03-01 NOTE — Discharge Summary (Signed)
[name], [name] NO.:  [id number]   MEDICAL RECORD NO.:  [id number]          PATIENT TYPE:  IPS   LOCATION:  0501                          FACILITY:  [facility]   PHYSICIAN:  Geoffery Lyons, M.D.      DATE OF BIRTH:  December 02, 1955   DATE OF ADMISSION:  12/08/2008  DATE OF DISCHARGE:  12/09/2008                               DISCHARGE SUMMARY   IDENTIFYING INFORMATION:  A 55 year old male, this is a voluntary  admission.   HISTORY OF PRESENT ILLNESS:  This one of several of [facility] admissions for  this 55 year old who presented on referral from the emergency room where  he initially presented on the 22nd intoxicated with superficial  scratches to his abdomen.  He has a history of chronic alcohol  dependence and was intoxicated with altered mental status.  The  lacerations did not require sutures.  Today he denies any dangerous  ideas, and has requested discharge to pursue employment, says he has a  job starting in [place] and wants to leave to be able to get his  side down there to pursue work.  He attributes his superficial  lacerations to the fact that he was intoxicated and had been drinking  quite a bit over the weekend.   PAST PSYCHIATRIC HISTORY:  Remarkable for a previous admission January  16 through the 18, 2010 also for alcohol dependence.  His record  reflects several admissions to our medical unit, also for alcohol abuse  and dependence and resulting medical complications.   SOCIAL HISTORY:  Single gentleman, is working some Holiday representative in [place] and wishes to leave to pursue work today.   No regular primary care Anistyn Graddy.   MEDICAL HISTORY:  Chronic alcohol abuse.  Past medical history  significant for an admission February 6 to the 8, 2010 for alcohol  intoxication and hematemesis.Marland Kitchen  Has a history of pancreatitis, history  of hepatitis C.   CURRENT MEDICAL PROBLEMS:  1. Alcohol-induced hepatitis.  2. Iron deficiency anemia.   CURRENT  MEDICATIONS:  None.   DRUG ALLERGIES:  None.   PHYSICAL EXAM:  Was done in the emergency room and is noted in the  record.  He is in no distress today, fully alert and oriented, up and  ambulatory.  Alcohol level on initial presentation was 338 on February  21, stabilized prior to transfer at 187.   His comprehensive metabolic panel is significant for a SGOT of 101, SGPT  of 52, alkaline phosphatase 76 and total bilirubin 1.2.   MENTAL STATUS EXAM:  Today fully alert male, pleasant, cooperative, has  been active in group, he is oriented x4 and in full contact with  reality.  No signs of delirium or confusion.  He is actively denying any  suicidal thoughts.  Thinking is goal directed.  He has plans to get to a  job in [place] today, requesting to be discharged.  Been  cooperative and appropriate with staff and no signs of psychosis or  confusion.  Immediate, recent, remote memory are intact.  Insight is  adequate.  Impulse  control and judgment within normal limits.   DISCHARGE DIAGNOSES:  AXIS I:  Alcohol dependence.  AXIS II:  No diagnosis.  AXIS III:  Alcohol induced hepatitis.  AXIS IV:  Deferred.  AXIS V:  Current 59, past year 66 estimated.   PLAN:  To discharge him today.  No discharge medications.  He has  transportation and is declining arrangements for follow-up appointments.      Margaret A. Scott, N.P.      Geoffery Lyons, M.D.  Electronically Signed    MAS/MEDQ  D:  12/09/2008  T:  12/09/2008  Job:  [id number]
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
## 2011-03-18 ENCOUNTER — Emergency Department (HOSPITAL_COMMUNITY)
Admission: EM | Admit: 2011-03-18 | Discharge: 2011-03-18 | Disposition: A | Discharge status: Home / Self Care | Payer: Self-pay | Attending: Emergency Medicine | Admitting: Emergency Medicine

## 2011-03-18 ENCOUNTER — Emergency Department (HOSPITAL_COMMUNITY): Payer: Self-pay

## 2011-03-18 DIAGNOSIS — M7989 Other specified soft tissue disorders: Secondary | ICD-10-CM | POA: Insufficient documentation

## 2011-03-18 DIAGNOSIS — S62309A Unspecified fracture of unspecified metacarpal bone, initial encounter for closed fracture: Secondary | ICD-10-CM | POA: Insufficient documentation

## 2011-03-18 DIAGNOSIS — Z23 Encounter for immunization: Secondary | ICD-10-CM | POA: Insufficient documentation

## 2011-04-08 ENCOUNTER — Inpatient Hospital Stay (HOSPITAL_COMMUNITY)
Admission: EM | Admit: 2011-04-08 | Discharge: 2011-04-11 | DRG: 439 | Disposition: A | Discharge status: Home / Self Care | Payer: Self-pay | Attending: Internal Medicine | Admitting: Internal Medicine

## 2011-04-08 ENCOUNTER — Emergency Department (HOSPITAL_COMMUNITY): Payer: Self-pay

## 2011-04-08 DIAGNOSIS — Z8619 Personal history of other infectious and parasitic diseases: Secondary | ICD-10-CM

## 2011-04-08 DIAGNOSIS — S62309A Unspecified fracture of unspecified metacarpal bone, initial encounter for closed fracture: Secondary | ICD-10-CM | POA: Diagnosis present

## 2011-04-08 DIAGNOSIS — Z5900 Homelessness unspecified: Secondary | ICD-10-CM

## 2011-04-08 DIAGNOSIS — I10 Essential (primary) hypertension: Secondary | ICD-10-CM | POA: Diagnosis present

## 2011-04-08 DIAGNOSIS — K861 Other chronic pancreatitis: Secondary | ICD-10-CM | POA: Diagnosis present

## 2011-04-08 DIAGNOSIS — F1010 Alcohol abuse, uncomplicated: Secondary | ICD-10-CM | POA: Diagnosis present

## 2011-04-08 DIAGNOSIS — K920 Hematemesis: Secondary | ICD-10-CM | POA: Diagnosis present

## 2011-04-08 DIAGNOSIS — K859 Acute pancreatitis without necrosis or infection, unspecified: Principal | ICD-10-CM | POA: Diagnosis present

## 2011-04-08 DIAGNOSIS — W010XXA Fall on same level from slipping, tripping and stumbling without subsequent striking against object, initial encounter: Secondary | ICD-10-CM | POA: Diagnosis present

## 2011-04-08 DIAGNOSIS — R1013 Epigastric pain: Secondary | ICD-10-CM | POA: Diagnosis present

## 2011-04-08 DIAGNOSIS — K219 Gastro-esophageal reflux disease without esophagitis: Secondary | ICD-10-CM | POA: Diagnosis present

## 2011-04-08 DIAGNOSIS — D6959 Other secondary thrombocytopenia: Secondary | ICD-10-CM | POA: Diagnosis present

## 2011-04-08 LAB — RAPID URINE DRUG SCREEN, HOSP PERFORMED
Amphetamines: NOT DETECTED
Benzodiazepines: NOT DETECTED
Cocaine: NOT DETECTED
Opiates: NOT DETECTED
Tetrahydrocannabinol: NOT DETECTED

## 2011-04-08 LAB — DIFFERENTIAL
Eosinophils Relative: 3 % (ref 0–5)
Lymphocytes Relative: 51 % — ABNORMAL HIGH (ref 12–46)
Monocytes Absolute: 0.5 10*3/uL (ref 0.1–1.0)
Monocytes Relative: 14 % — ABNORMAL HIGH (ref 3–12)
Neutrophils Relative %: 31 % — ABNORMAL LOW (ref 43–77)

## 2011-04-08 LAB — CBC
Hemoglobin: 14.4 g/dL (ref 13.0–17.0)
MCV: 84.5 fL (ref 78.0–100.0)
Platelets: 85 10*3/uL — ABNORMAL LOW (ref 150–400)
RBC: 5.11 MIL/uL (ref 4.22–5.81)
WBC: 3.5 10*3/uL — ABNORMAL LOW (ref 4.0–10.5)

## 2011-04-08 LAB — URINALYSIS, ROUTINE W REFLEX MICROSCOPIC
Glucose, UA: NEGATIVE mg/dL
Hgb urine dipstick: NEGATIVE
Specific Gravity, Urine: 1.008 (ref 1.005–1.030)

## 2011-04-08 LAB — COMPREHENSIVE METABOLIC PANEL
ALT: 113 U/L — ABNORMAL HIGH (ref 0–53)
AST: 211 U/L — ABNORMAL HIGH (ref 0–37)
CO2: 27 mEq/L (ref 19–32)
Calcium: 9.1 mg/dL (ref 8.4–10.5)
Potassium: 3.8 mEq/L (ref 3.5–5.1)
Sodium: 142 mEq/L (ref 135–145)
Total Protein: 8.6 g/dL — ABNORMAL HIGH (ref 6.0–8.3)

## 2011-04-08 LAB — APTT: aPTT: 33 seconds (ref 24–37)

## 2011-04-09 LAB — BASIC METABOLIC PANEL
BUN: 5 mg/dL — ABNORMAL LOW (ref 6–23)
CO2: 26 mEq/L (ref 19–32)
Chloride: 106 mEq/L (ref 96–112)
Glucose, Bld: 103 mg/dL — ABNORMAL HIGH (ref 70–99)
Potassium: 3.9 mEq/L (ref 3.5–5.1)

## 2011-04-09 LAB — LIPASE, BLOOD: Lipase: 78 U/L — ABNORMAL HIGH (ref 11–59)

## 2011-04-09 LAB — TYPE AND SCREEN: Antibody Screen: NEGATIVE

## 2011-04-09 LAB — CBC
HCT: 37.6 % — ABNORMAL LOW (ref 39.0–52.0)
HCT: 38.2 % — ABNORMAL LOW (ref 39.0–52.0)
Hemoglobin: 12.1 g/dL — ABNORMAL LOW (ref 13.0–17.0)
Hemoglobin: 12.8 g/dL — ABNORMAL LOW (ref 13.0–17.0)
MCH: 28.2 pg (ref 26.0–34.0)
MCHC: 33.5 g/dL (ref 30.0–36.0)
WBC: 1.9 10*3/uL — ABNORMAL LOW (ref 4.0–10.5)

## 2011-04-09 LAB — VITAMIN B12: Vitamin B-12: 1637 pg/mL — ABNORMAL HIGH (ref 211–911)

## 2011-04-09 LAB — MAGNESIUM: Magnesium: 1.8 mg/dL (ref 1.5–2.5)

## 2011-04-11 LAB — CBC
Hemoglobin: 13.1 g/dL (ref 13.0–17.0)
MCH: 27.6 pg (ref 26.0–34.0)
MCHC: 32.7 g/dL (ref 30.0–36.0)
MCV: 84.6 fL (ref 78.0–100.0)
Platelets: 57 10*3/uL — ABNORMAL LOW (ref 150–400)

## 2011-04-11 LAB — DIFFERENTIAL
Basophils Relative: 1 % (ref 0–1)
Eosinophils Absolute: 0.2 10*3/uL (ref 0.0–0.7)
Monocytes Absolute: 0.5 10*3/uL (ref 0.1–1.0)
Monocytes Relative: 16 % — ABNORMAL HIGH (ref 3–12)

## 2011-04-11 LAB — BASIC METABOLIC PANEL
Calcium: 9.2 mg/dL (ref 8.4–10.5)
Creatinine, Ser: 0.5 mg/dL (ref 0.50–1.35)
GFR calc non Af Amer: >60 mL/min (ref >60)
Glucose, Bld: 87 mg/dL (ref 70–99)
Sodium: 135 mEq/L (ref 135–145)

## 2011-04-12 LAB — HCV RNA QUANT: HCV Quantitative: 32000 IU/mL — ABNORMAL HIGH (ref <43)

## 2011-04-14 ENCOUNTER — Emergency Department (HOSPITAL_COMMUNITY): Payer: Self-pay

## 2011-04-14 ENCOUNTER — Emergency Department (HOSPITAL_COMMUNITY)
Admission: EM | Admit: 2011-04-14 | Discharge: 2011-04-14 | Disposition: A | Discharge status: Home / Self Care | Payer: Self-pay | Attending: Emergency Medicine | Admitting: Emergency Medicine

## 2011-04-14 DIAGNOSIS — F1010 Alcohol abuse, uncomplicated: Secondary | ICD-10-CM | POA: Insufficient documentation

## 2011-04-14 DIAGNOSIS — K7460 Unspecified cirrhosis of liver: Secondary | ICD-10-CM | POA: Insufficient documentation

## 2011-04-14 DIAGNOSIS — Z4789 Encounter for other orthopedic aftercare: Secondary | ICD-10-CM | POA: Insufficient documentation

## 2011-04-14 DIAGNOSIS — B1920 Unspecified viral hepatitis C without hepatic coma: Secondary | ICD-10-CM | POA: Insufficient documentation

## 2011-04-14 DIAGNOSIS — I10 Essential (primary) hypertension: Secondary | ICD-10-CM | POA: Insufficient documentation
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
## 2011-04-30 ENCOUNTER — Emergency Department (HOSPITAL_COMMUNITY)
Admission: EM | Admit: 2011-04-30 | Discharge: 2011-04-30 | Discharge status: Against Medical Advice | Payer: Self-pay | Attending: Emergency Medicine | Admitting: Emergency Medicine

## 2011-05-04 ENCOUNTER — Emergency Department (HOSPITAL_COMMUNITY)
Admission: EM | Admit: 2011-05-04 | Discharge: 2011-05-04 | Disposition: A | Discharge status: Home / Self Care | Payer: Self-pay | Attending: Emergency Medicine | Admitting: Emergency Medicine

## 2011-05-04 DIAGNOSIS — K861 Other chronic pancreatitis: Secondary | ICD-10-CM | POA: Insufficient documentation

## 2011-05-04 DIAGNOSIS — I10 Essential (primary) hypertension: Secondary | ICD-10-CM | POA: Insufficient documentation

## 2011-05-04 DIAGNOSIS — Z8619 Personal history of other infectious and parasitic diseases: Secondary | ICD-10-CM | POA: Insufficient documentation

## 2011-05-04 DIAGNOSIS — K7010 Alcoholic hepatitis without ascites: Secondary | ICD-10-CM | POA: Insufficient documentation

## 2011-05-04 DIAGNOSIS — F1010 Alcohol abuse, uncomplicated: Secondary | ICD-10-CM | POA: Insufficient documentation

## 2011-05-04 LAB — CBC
Hemoglobin: 13.6 g/dL (ref 13.0–17.0)
Platelets: 62 10*3/uL — ABNORMAL LOW (ref 150–400)
RBC: 4.78 MIL/uL (ref 4.22–5.81)
WBC: 4.4 10*3/uL (ref 4.0–10.5)

## 2011-05-04 LAB — COMPREHENSIVE METABOLIC PANEL
ALT: 47 U/L (ref 0–53)
Alkaline Phosphatase: 76 U/L (ref 39–117)
BUN: 6 mg/dL (ref 6–23)
Chloride: 102 mEq/L (ref 96–112)
Glucose, Bld: 83 mg/dL (ref 70–99)
Potassium: 3.6 mEq/L (ref 3.5–5.1)
Sodium: 140 mEq/L (ref 135–145)
Total Bilirubin: 1.9 mg/dL — ABNORMAL HIGH (ref 0.3–1.2)

## 2011-05-04 LAB — RAPID URINE DRUG SCREEN, HOSP PERFORMED
Amphetamines: NOT DETECTED
Benzodiazepines: NOT DETECTED
Cocaine: NOT DETECTED
Opiates: NOT DETECTED
Tetrahydrocannabinol: NOT DETECTED

## 2011-05-04 LAB — APTT: aPTT: 35 seconds (ref 24–37)

## 2011-05-04 LAB — URINALYSIS, ROUTINE W REFLEX MICROSCOPIC
Glucose, UA: NEGATIVE mg/dL
Hgb urine dipstick: NEGATIVE
Leukocytes, UA: NEGATIVE
Protein, ur: NEGATIVE mg/dL
pH: 5 (ref 5.0–8.0)

## 2011-05-04 LAB — PROTIME-INR: INR: 1.2 (ref 0.00–1.49)

## 2011-05-04 LAB — SAMPLE TO BLOOD BANK

## 2011-05-04 LAB — ETHANOL: Alcohol, Ethyl (B): 380 mg/dL — ABNORMAL HIGH (ref 0–11)
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
## 2011-05-18 IMAGING — CR DG ABDOMEN ACUTE W/ 1V CHEST
3 series · 3 of 3 positions shown · non-contrast
Comparison: 01/23/2009

CLINICAL DATA: Abdominal pain and vomiting.

ACUTE ABDOMEN SERIES (ABDOMEN 2 VIEW & CHEST 1 VIEW)

[w chest pa]
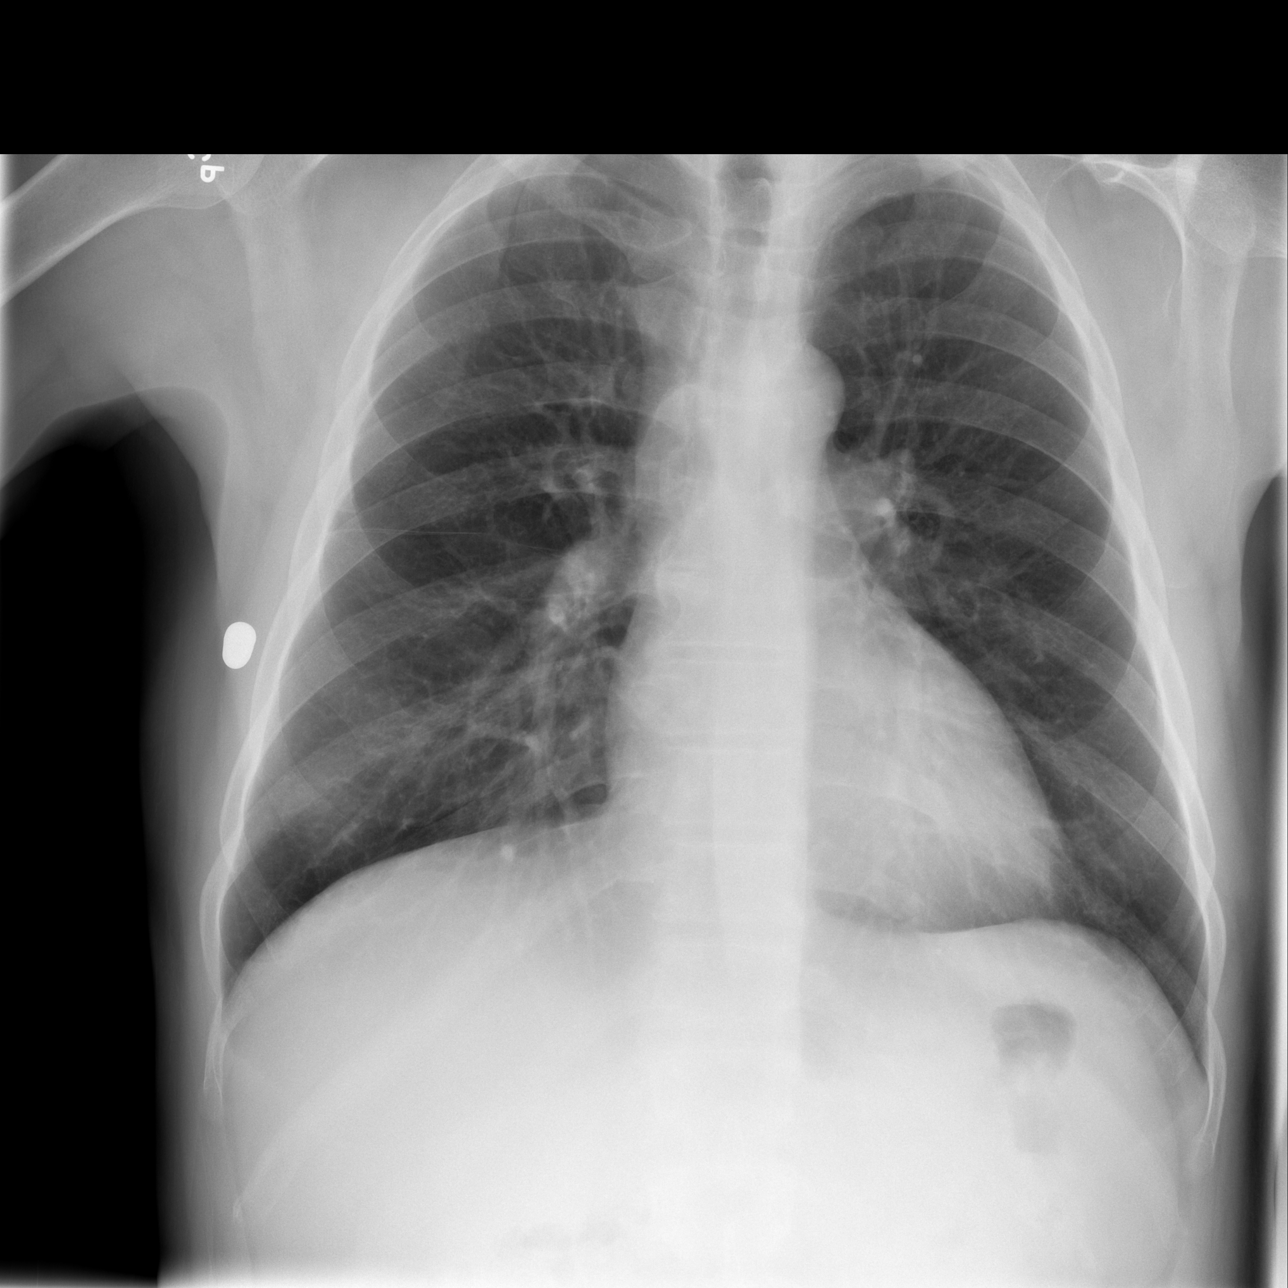

[w abdomen upright]
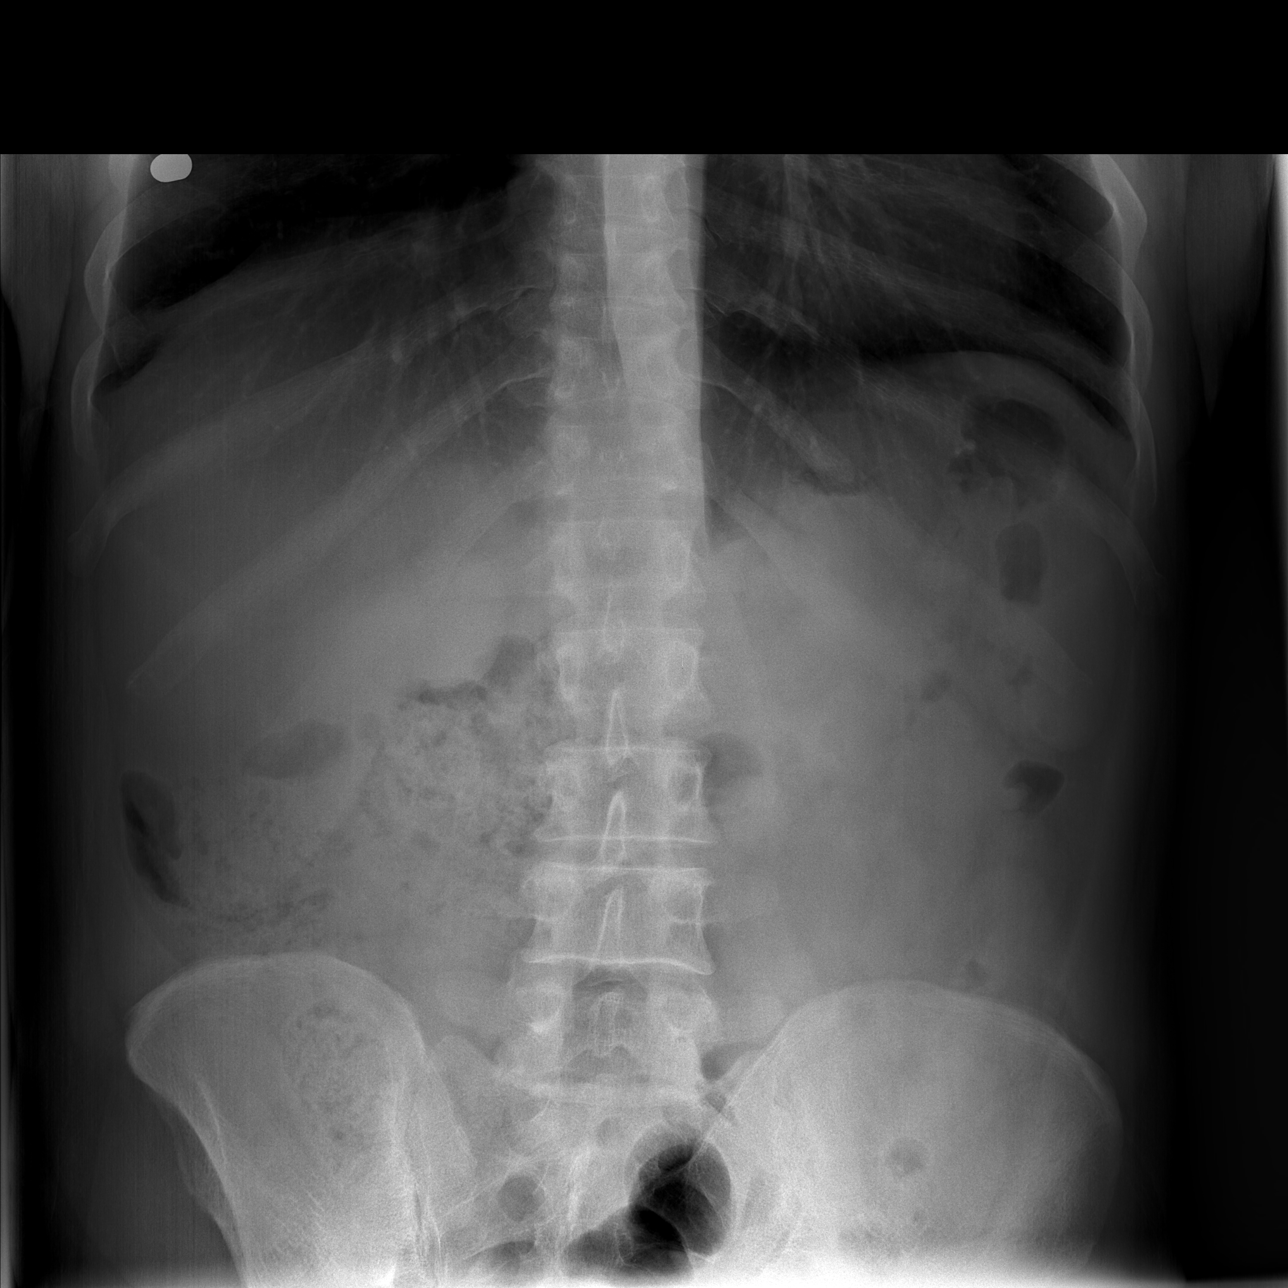

[t abdomen supine]
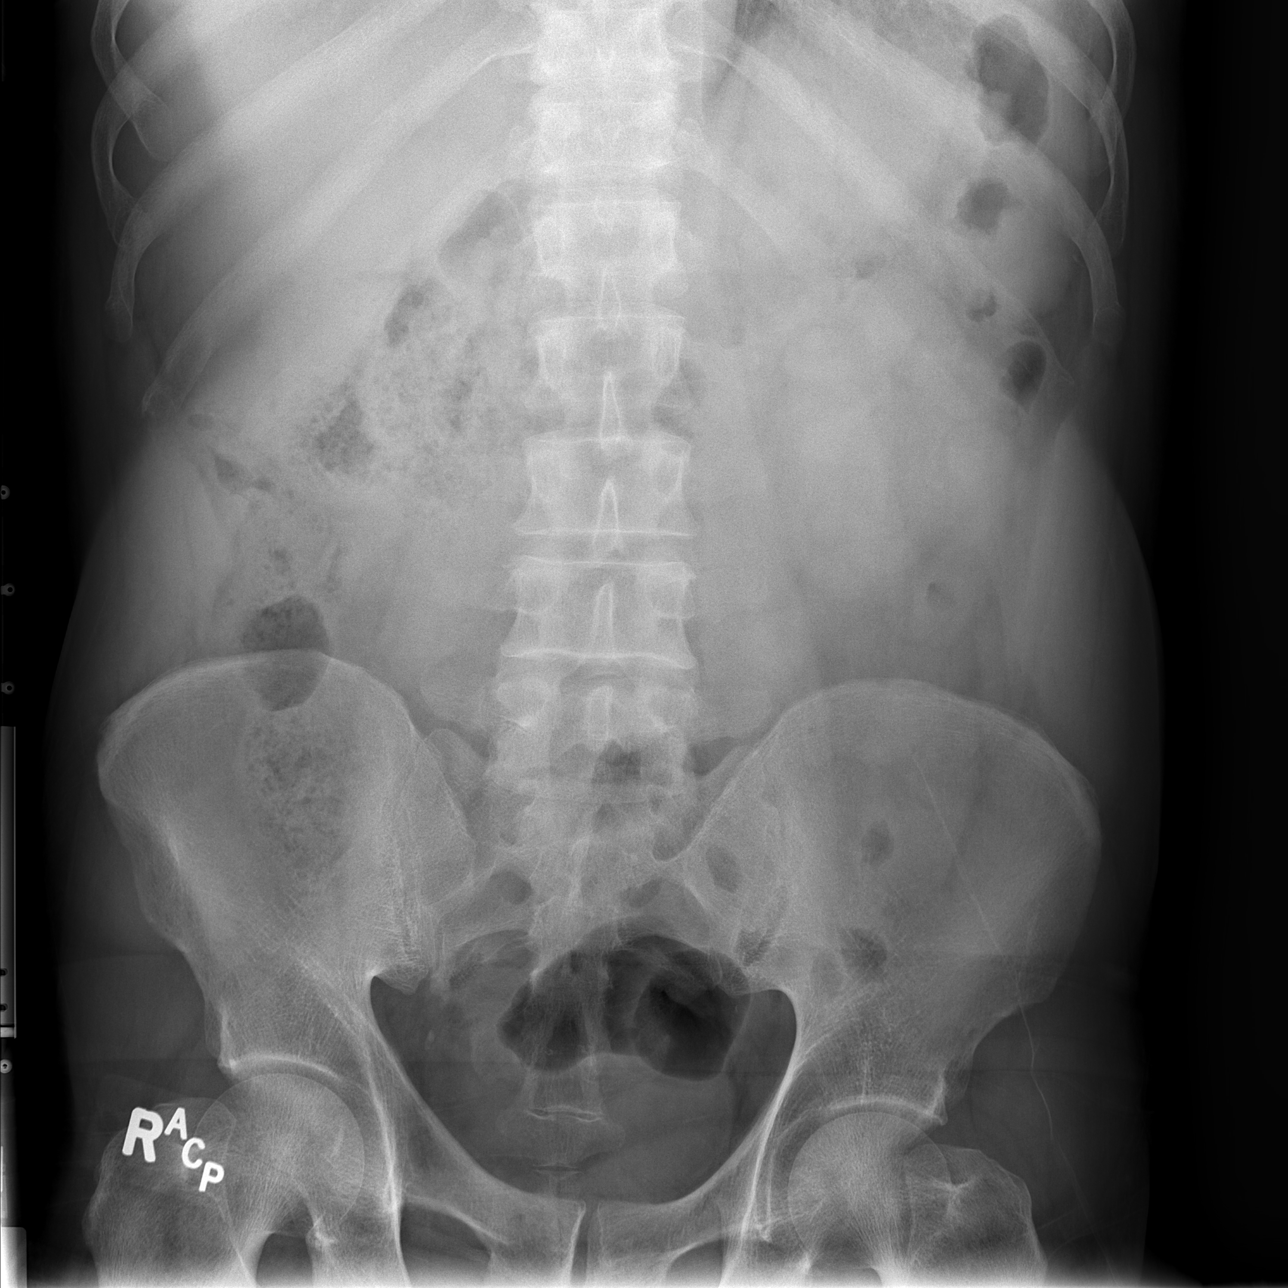

[3 of 3 positions shown; findings below may reference images not displayed]

FINDINGS: The cardiomediastinal silhouette is unremarkable.
No evidence of focal airspace disease, pleural effusion, or
pneumothorax.

The bowel gas pattern is unremarkable.
There is no evidence of bowel obstruction or pneumoperitoneum.
No acute bony abnormalities are identified.
IMPRESSION: No evidence of acute abnormality.

Unremarkable bowel gas pattern.
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
## 2011-05-29 IMAGING — US US ABDOMEN PORT
1 series · 14 of 25 positions shown · non-contrast
Comparison: 02/03/2009

CLINICAL DATA: GI bleed.

COMPLETE ABDOMINAL ULTRASOUND

[Series 1: us abdomen port · 0.30mm/px · 14 of 59 slices shown]
[im 1/59]
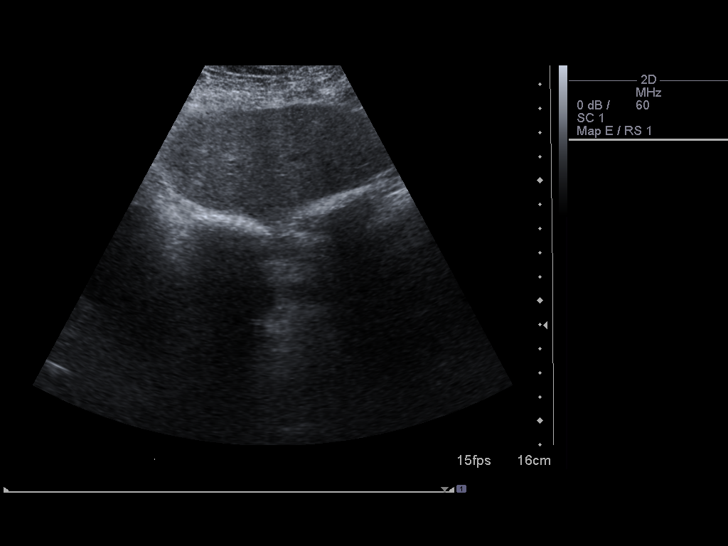
[im 5/59]
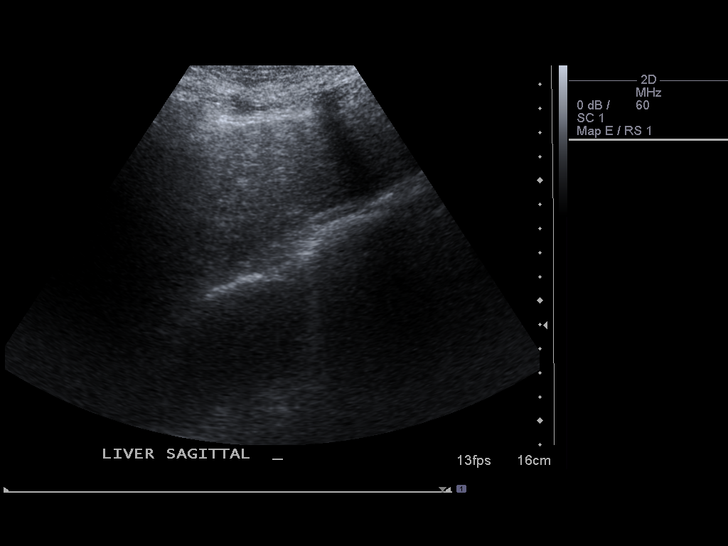
[im 10/59]
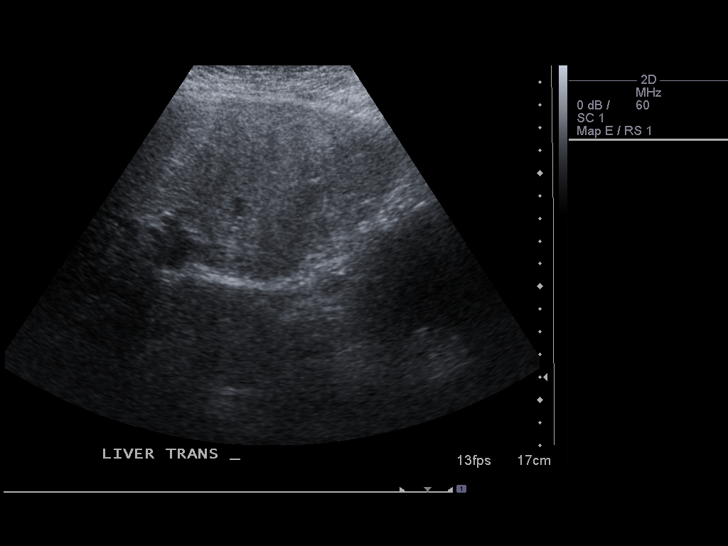
[im 15/59]
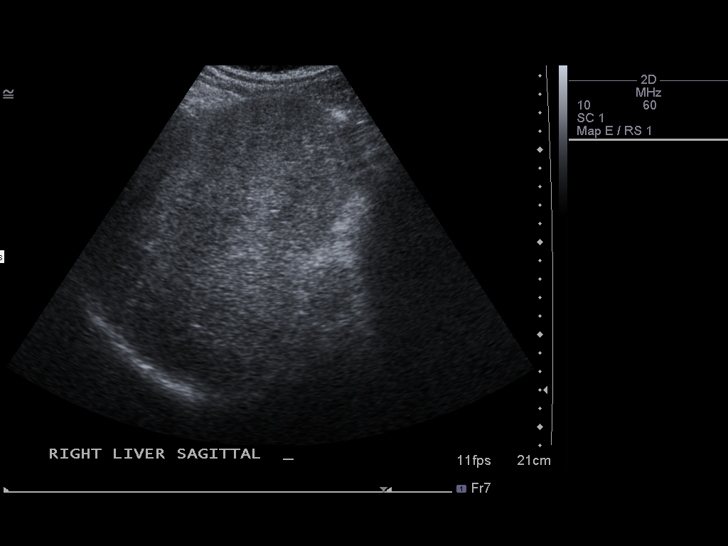
[im 20/59]
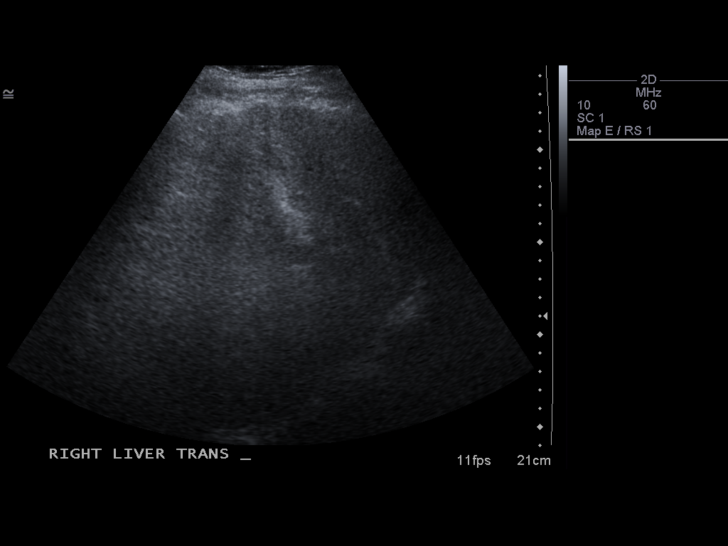
[im 22/59]
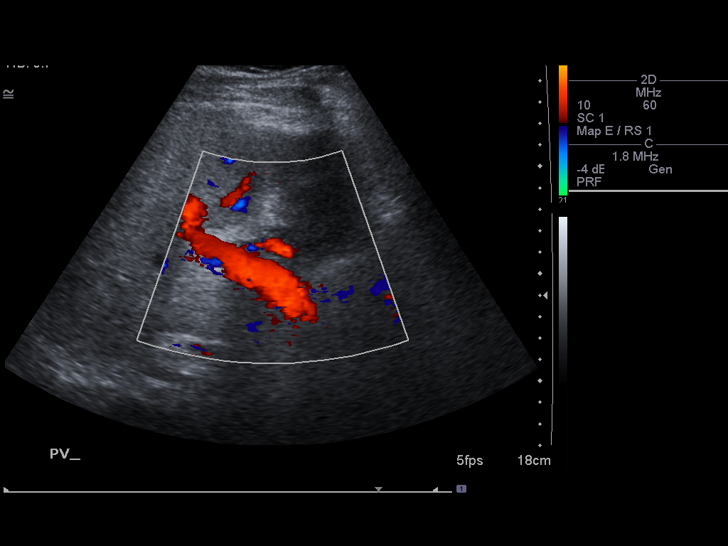
[im 27/59]
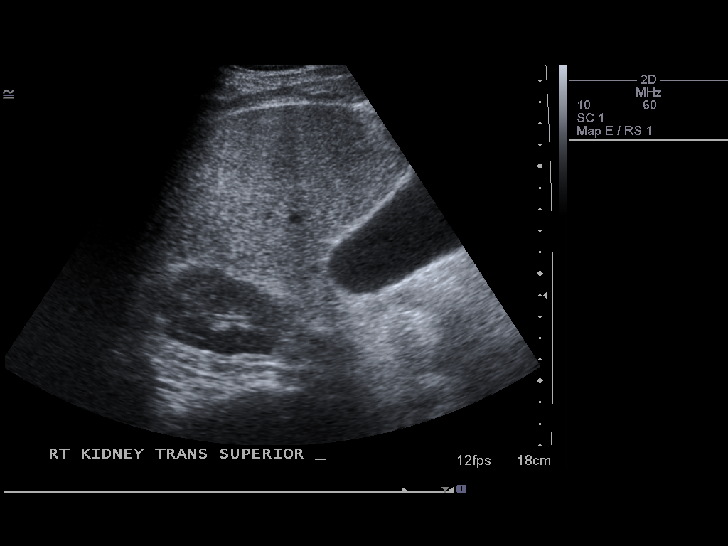
[im 32/59]
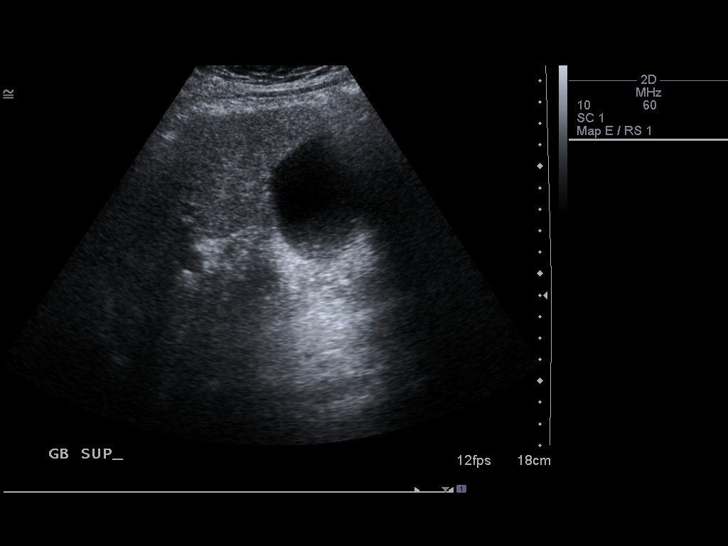
[im 37/59]
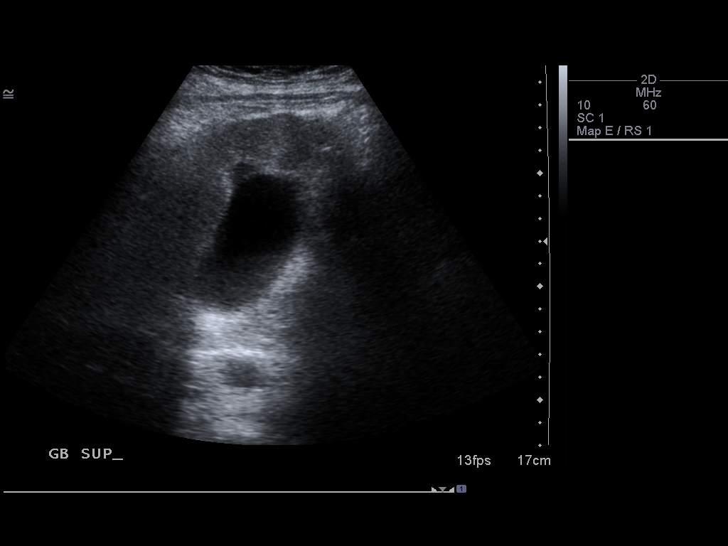
[im 39/59]
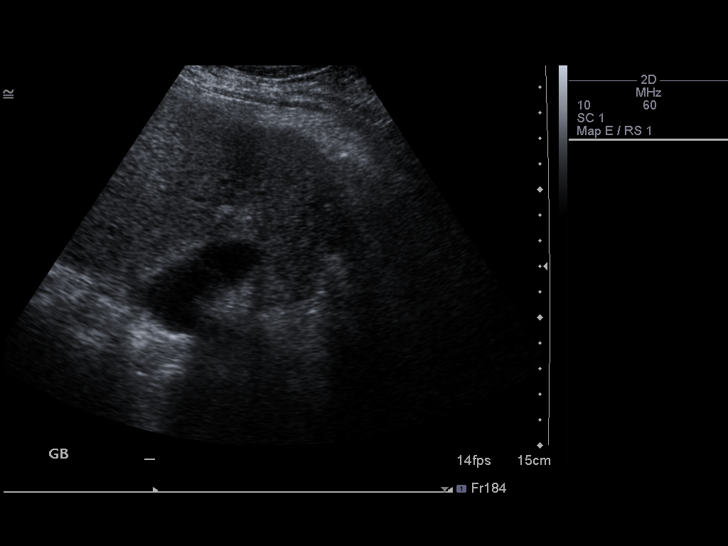
[im 44/59]
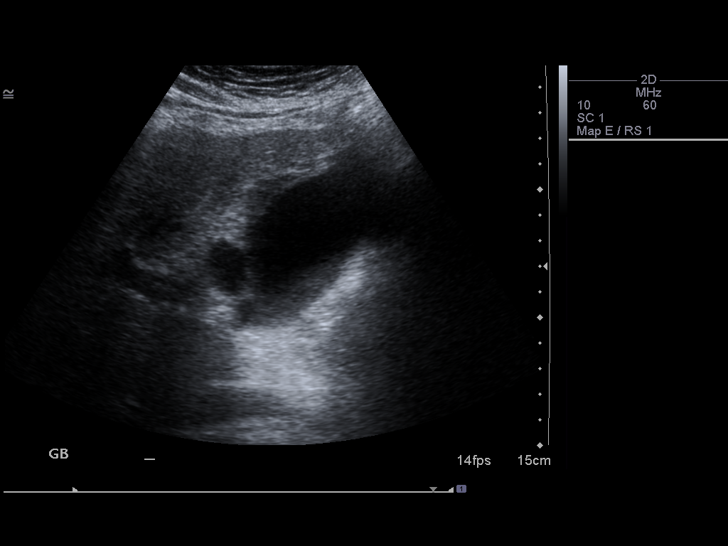
[im 49/59]
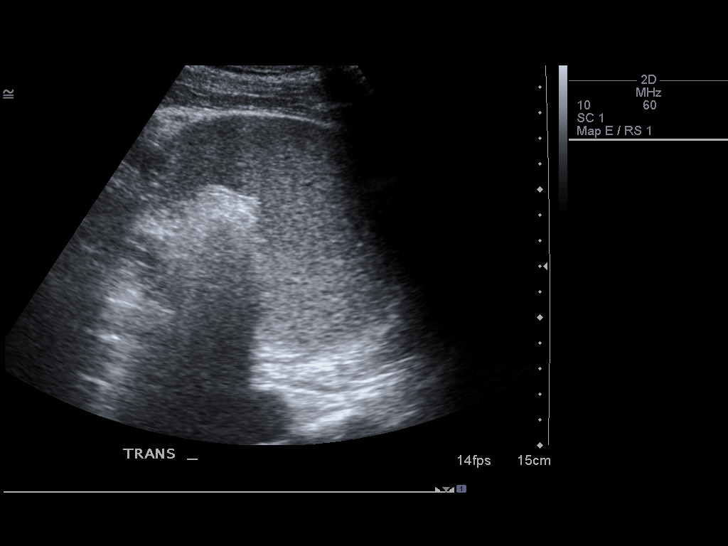
[im 54/59]
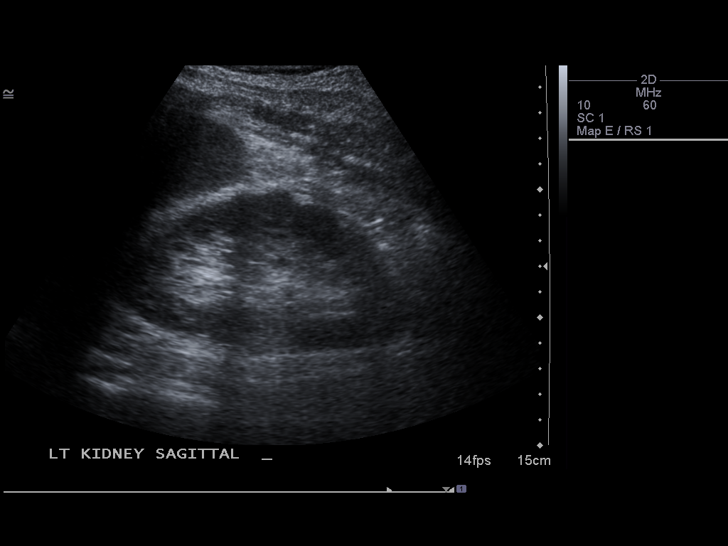
[im 59/59]
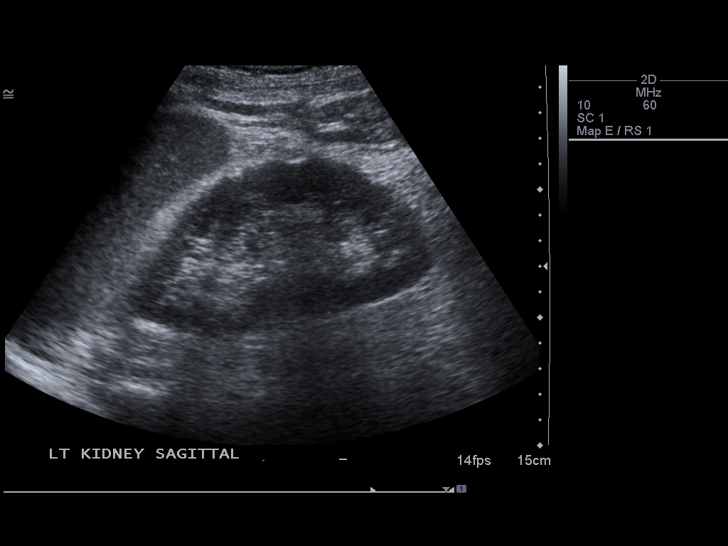

[14 of 25 positions shown; findings below may reference images not displayed]

FINDINGS: Gallbladder:  Well distended gallbladder without gallstones or
gallbladder wall thickening.

Common bile duct:  4.4 mm.

Liver:  The liver is difficult to scan due to body habitus.  There
is increased echogenicity in the liver due to hepatocellular
disease.  No focal liver lesion is identified.

IVC:  Limited visualization.

Pancreas:  Poor visualization of the pancreas.

Spleen:  11.2 cm in length without focal lesion.  Spleen is upper
normal in size.

Right Kidney:  12.4 cm in length without obstruction or mass.

Left Kidney:  12.5 cm in length without obstruction or mass.

Abdominal aorta:  Lack of visualization due to bowel gas.
IMPRESSION: Distended gallbladder without gallstones or gallbladder wall
thickening.  There is no biliary dilatation.  No acute abnormality.
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
## 2011-06-10 IMAGING — CR DG ABDOMEN 1V
1 series · 1 of 1 positions shown · non-contrast
Comparison: 05/05/2009

CLINICAL DATA: Abdominal pain.

ABDOMEN - 1 VIEW

[t abdomen supine]
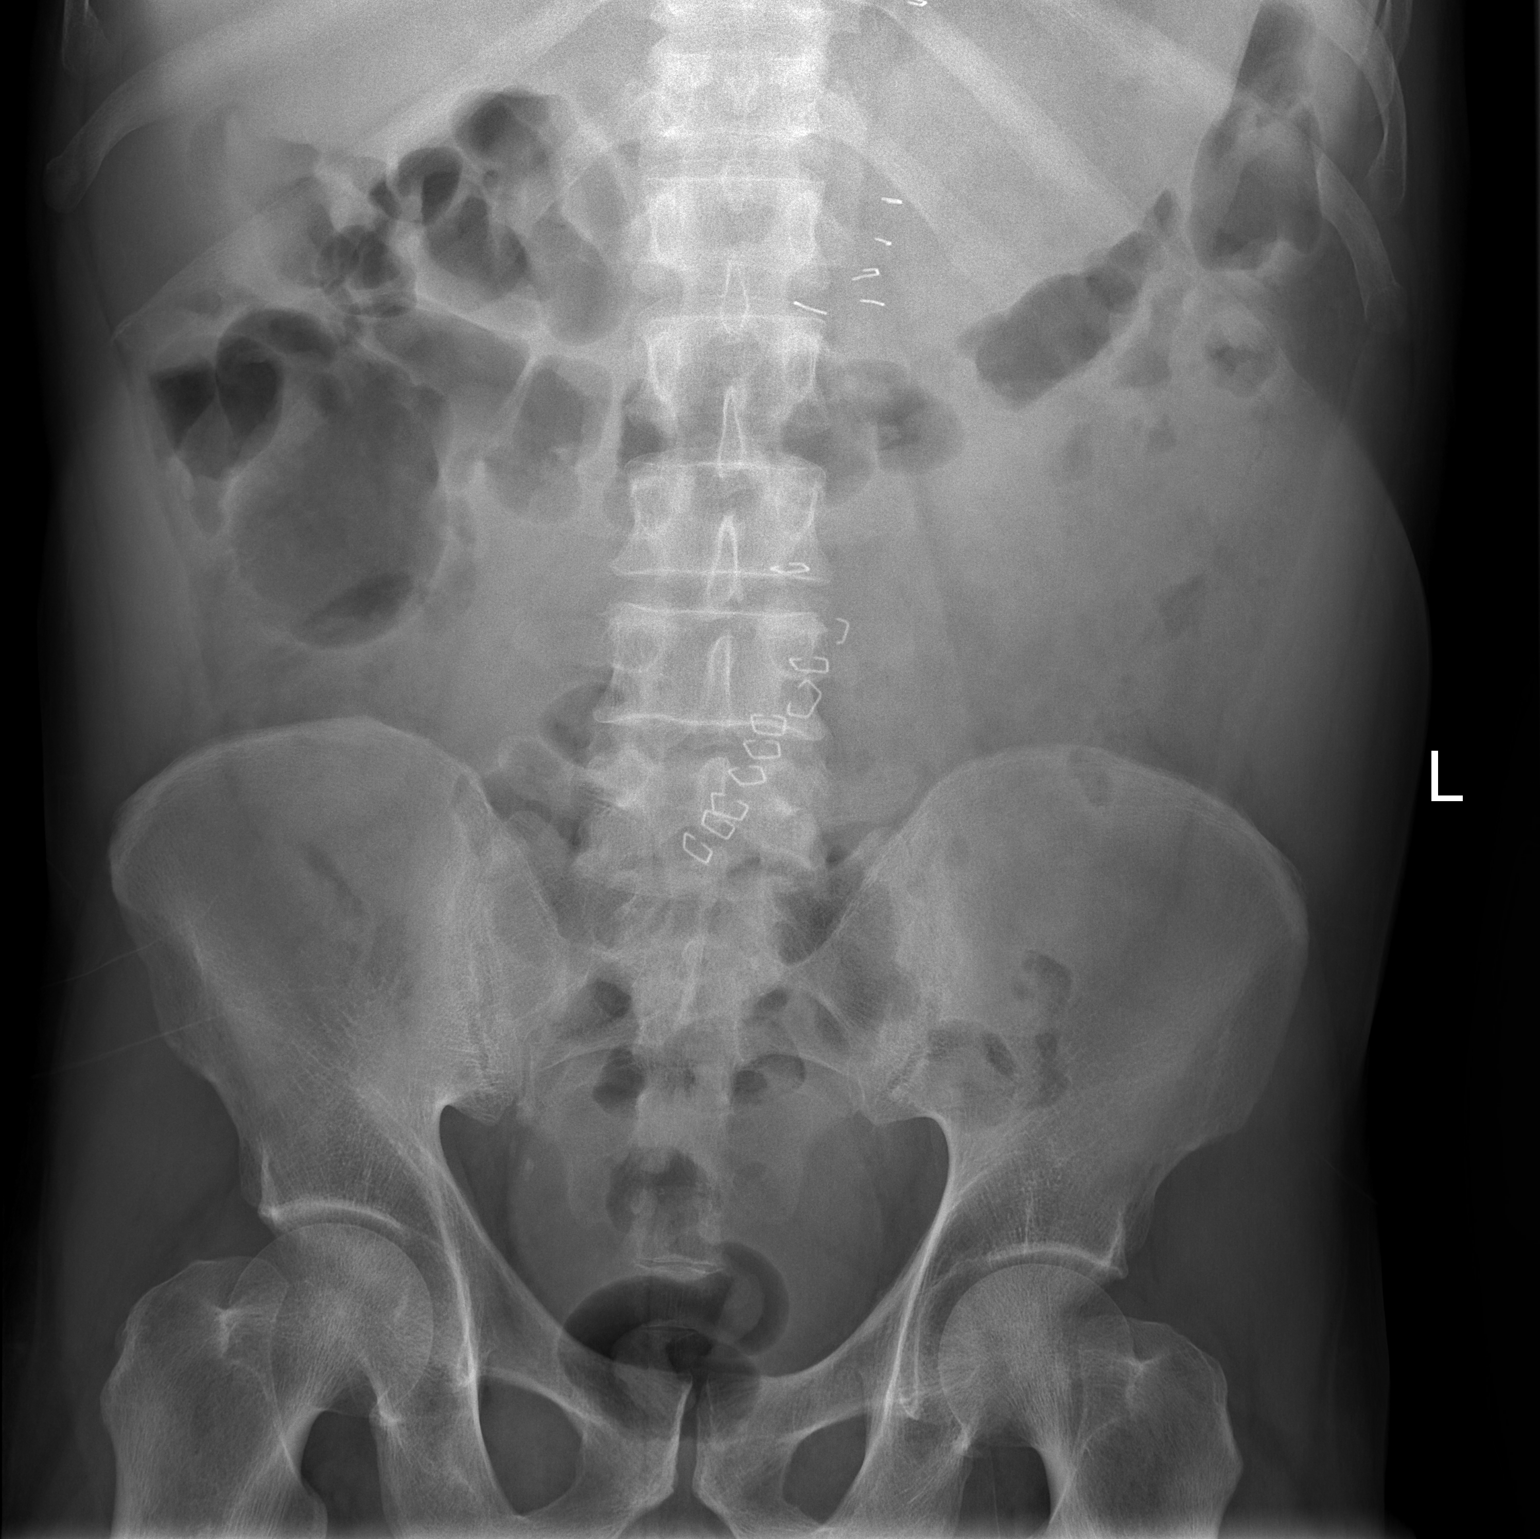

[1 of 1 positions shown; findings below may reference images not displayed]

FINDINGS: Single view of the abdomen demonstrates skin staples
along the midline of the abdomen.  There is a nonspecific bowel gas
pattern with gas in the colon.  No evidence for an obstructive
process.  There are no large abdominal calcifications.  The bony
structures are intact.
IMPRESSION: Nonspecific bowel gas pattern.
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
## 2011-06-24 IMAGING — CR DG ABDOMEN ACUTE W/ 1V CHEST
3 series · 3 of 3 positions shown · non-contrast
Comparison: 05/05/2009.

CLINICAL DATA: Vomiting.  Abdominal pain.

ACUTE ABDOMEN SERIES (ABDOMEN 2 VIEW & CHEST 1 VIEW)

[w chest pa]
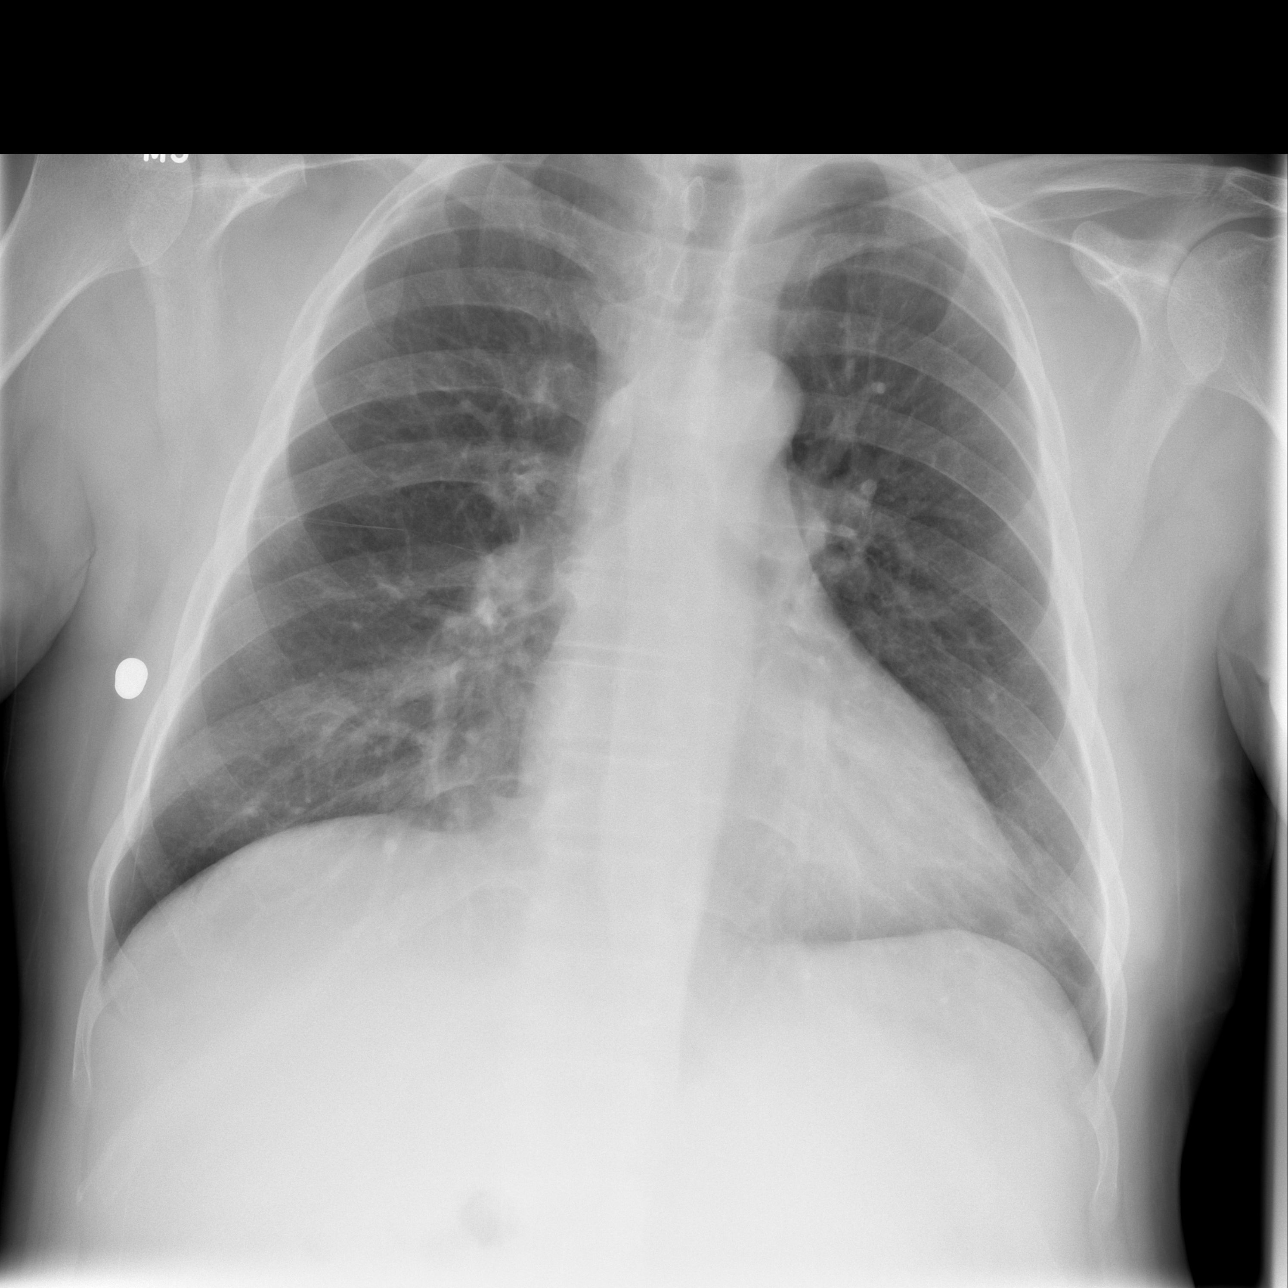

[w abdomen upright]
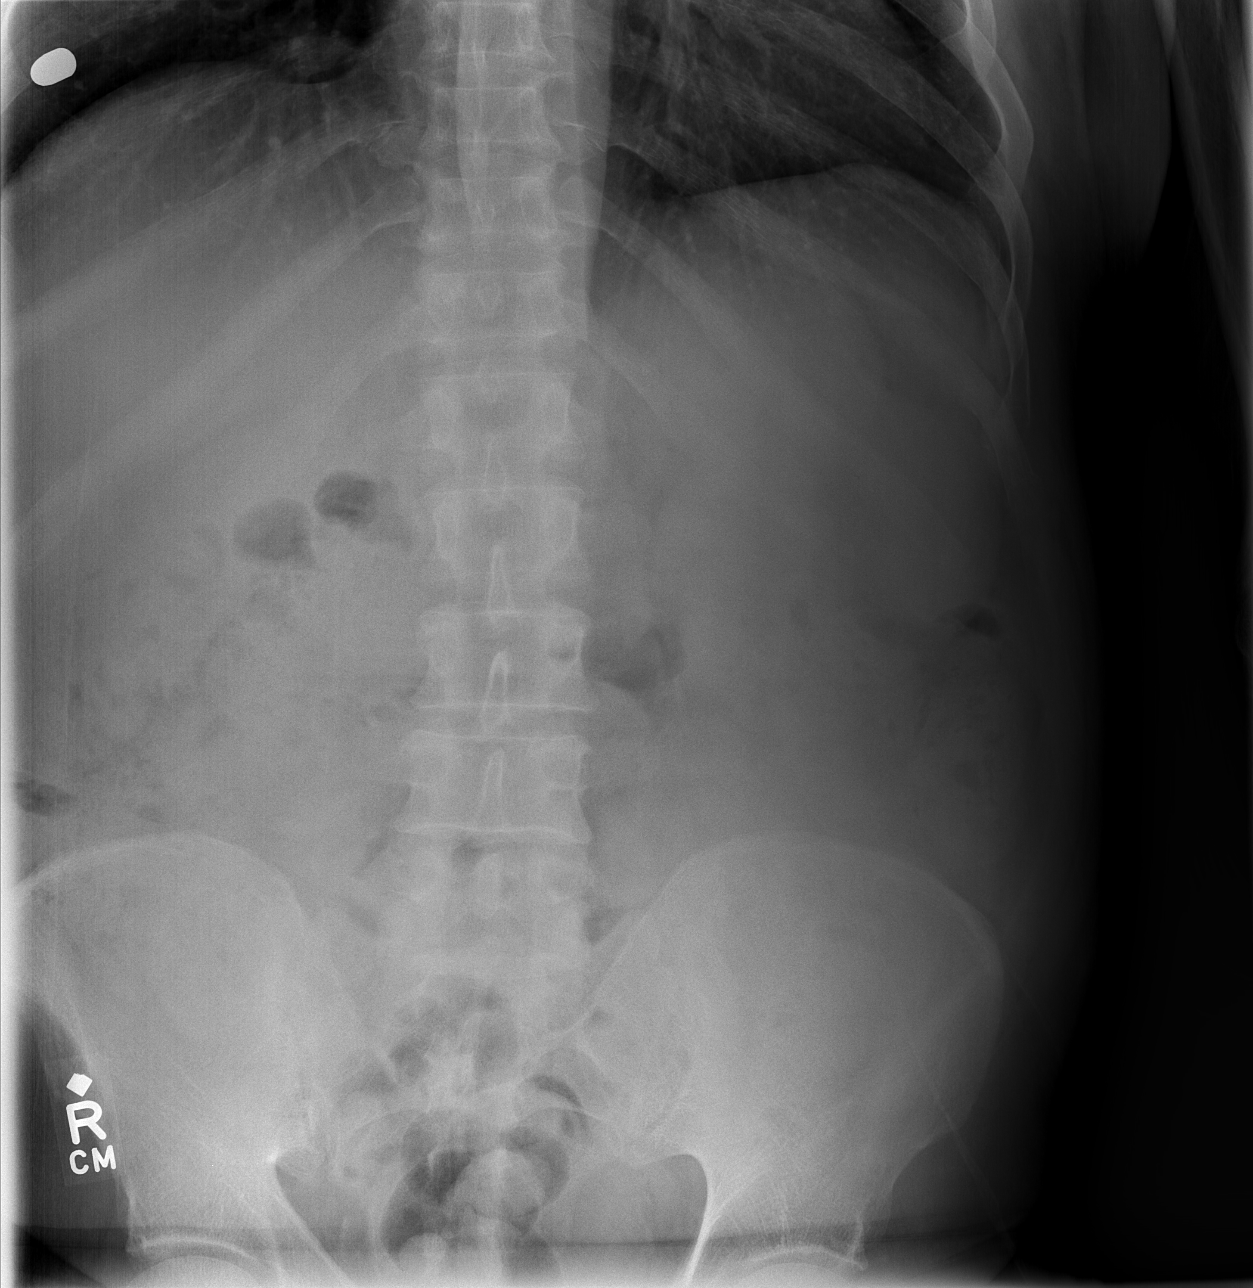

[t abdomen supine]
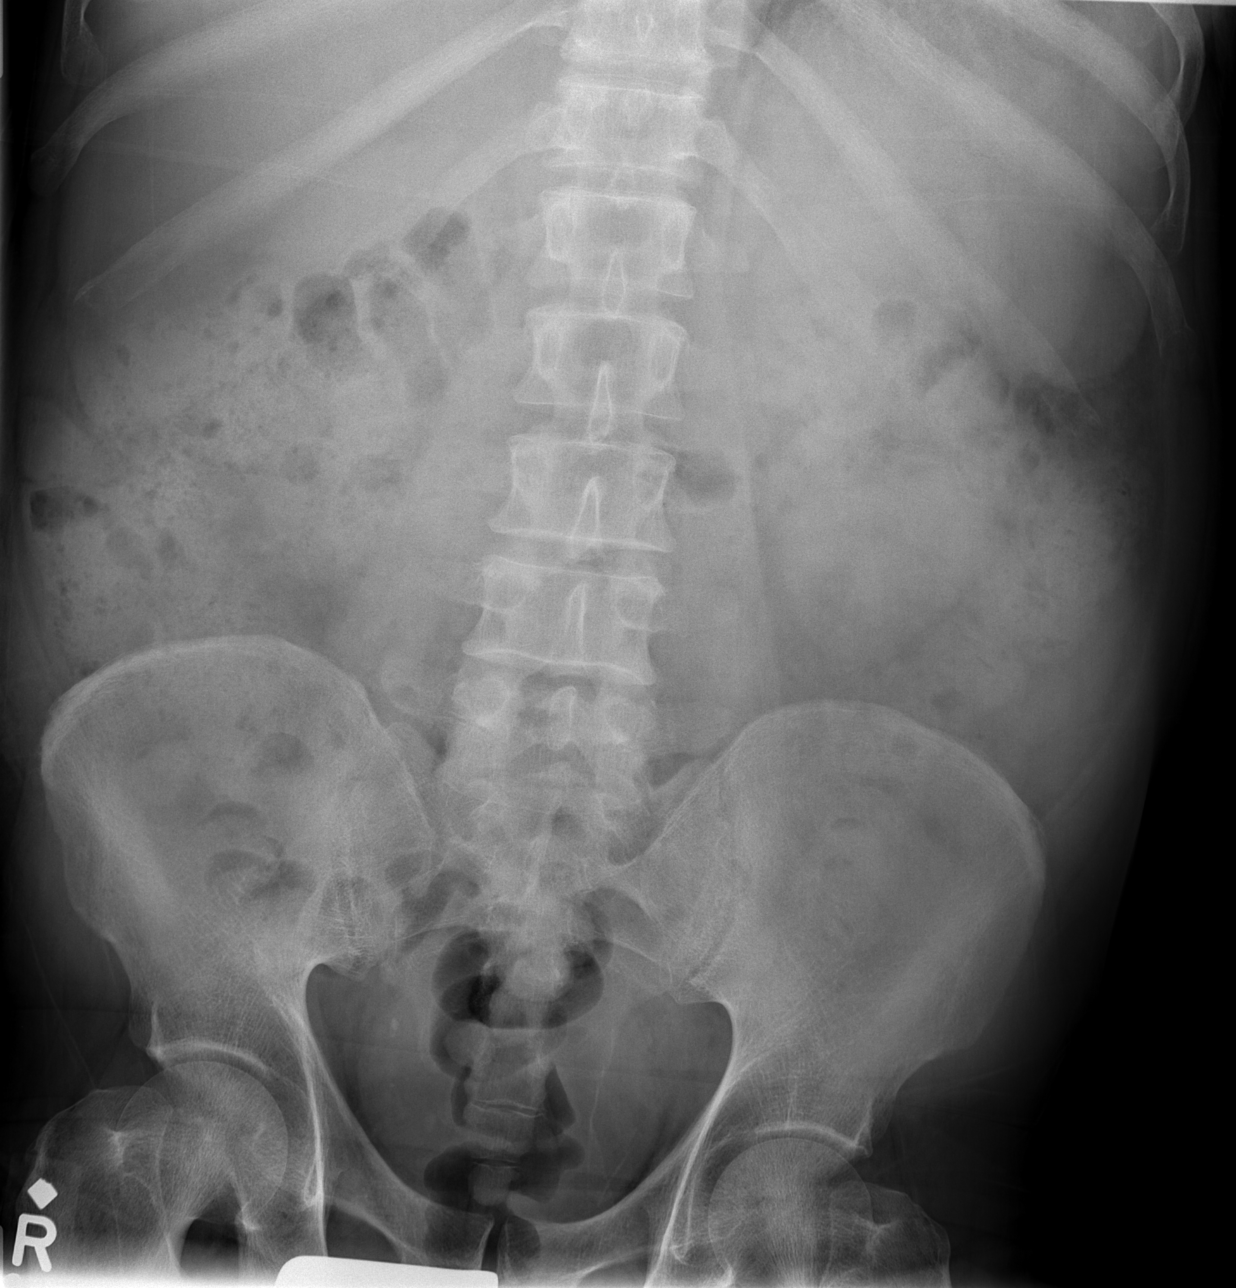

[3 of 3 positions shown; findings below may reference images not displayed]

FINDINGS: There is increased density at the medial right lung base
which could represent atelectasis or airspace disease.  No pleural
effusion.  No free air underneath the hemidiaphragms.

There is a large right-sided fecal burden.  No pathologic air fluid
levels are identified.  Rectal gas is present.  Bones appear within
normal limits.
IMPRESSION: 1.  Medial right lung base opacity could represent atelectasis or
airspace disease.
2.  Nonobstructive bowel gas pattern with right-sided large fecal
burden.

## 2011-07-08 LAB — DIFFERENTIAL
Basophils Relative: 1
Eosinophils Absolute: 0.1
Eosinophils Relative: 1
Lymphs Abs: 2.5
Monocytes Relative: 11
Neutrophils Relative %: 42 — ABNORMAL LOW

## 2011-07-08 LAB — COMPREHENSIVE METABOLIC PANEL
ALT: 49
AST: 85 — ABNORMAL HIGH
Alkaline Phosphatase: 62
CO2: 23
GFR calc Af Amer: >60
Glucose, Bld: 100 — ABNORMAL HIGH
Potassium: 3.5
Sodium: 139
Total Protein: 7.9

## 2011-07-08 LAB — ETHANOL: Alcohol, Ethyl (B): 286 — ABNORMAL HIGH

## 2011-07-08 LAB — CBC
Hemoglobin: 10.4 — ABNORMAL LOW
RBC: 4.4

## 2011-07-08 LAB — POCT CARDIAC MARKERS
Operator id: 270261
Troponin i, poc: <0.05

## 2011-07-10 IMAGING — CR DG HIP COMPLETE 2+V*R*
3 series · 3 of 3 positions shown · non-contrast
Comparison: None

CLINICAL DATA: Right hip pain

RIGHT HIP - COMPLETE 2+ VIEW

[t pelvis a.p.]
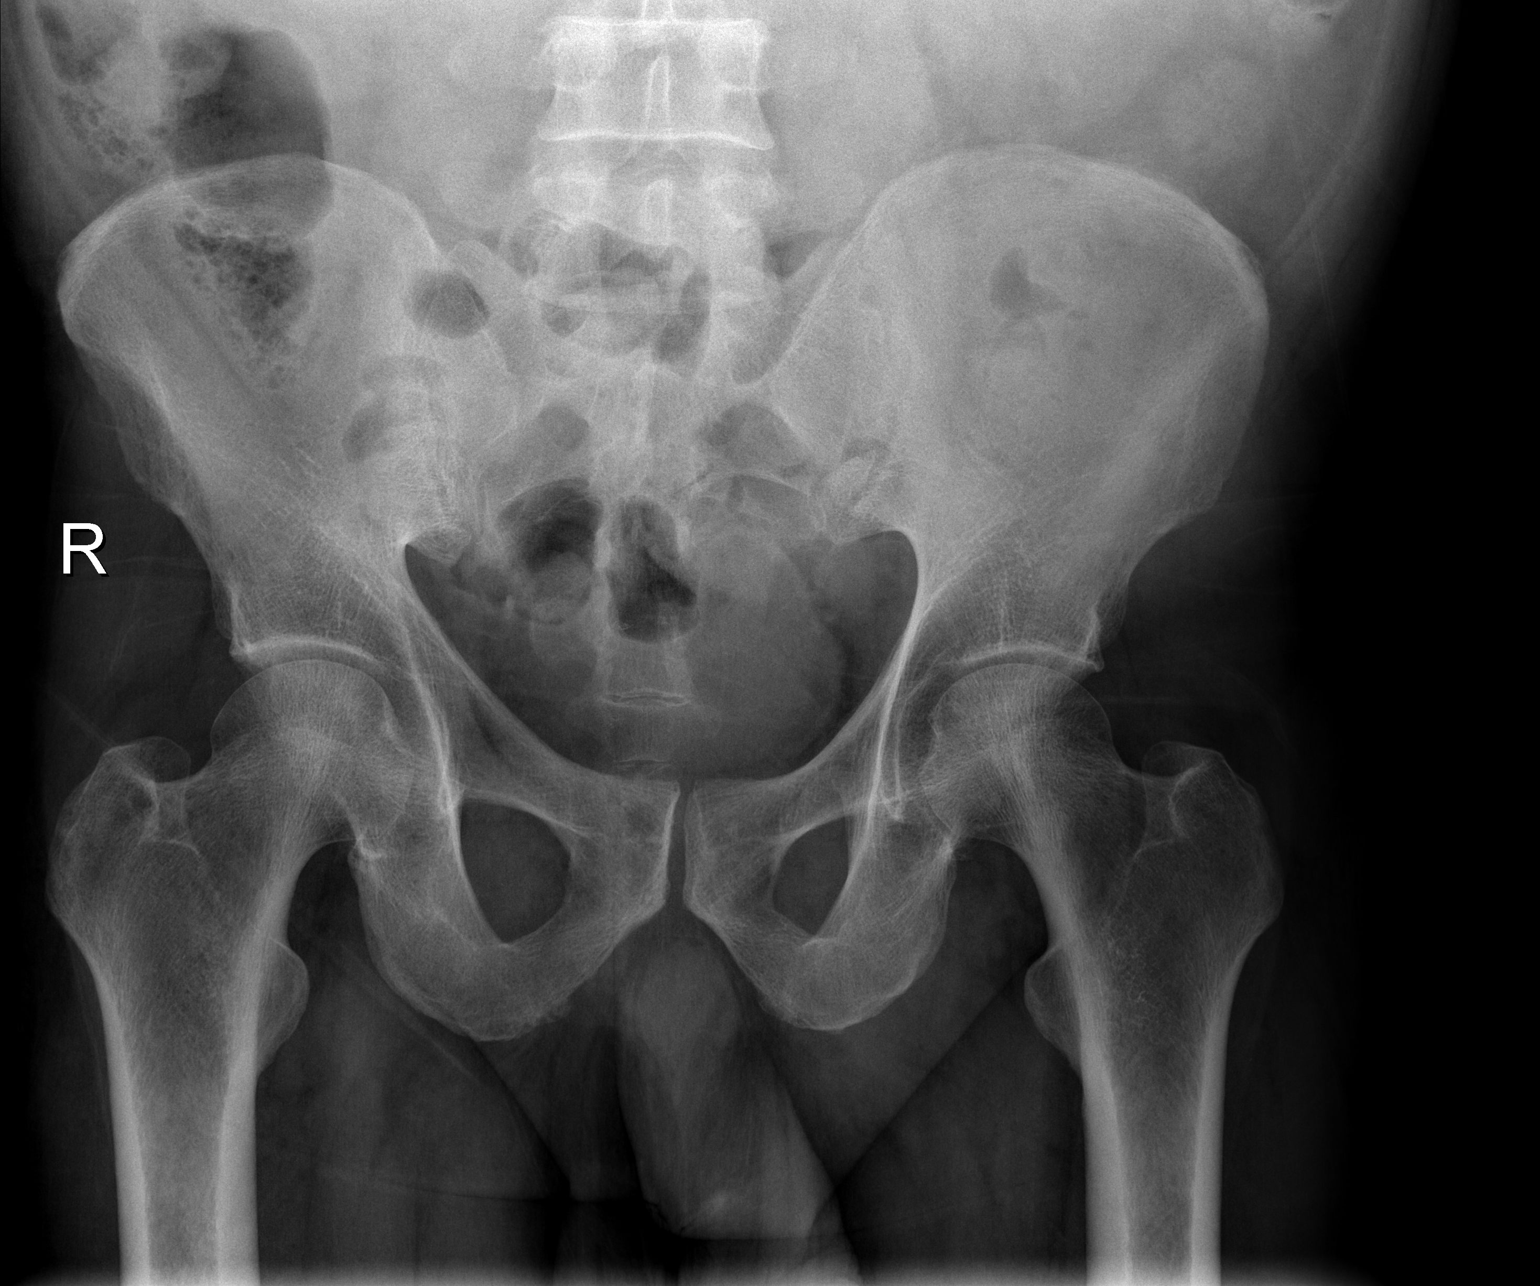

[t hip ap right]
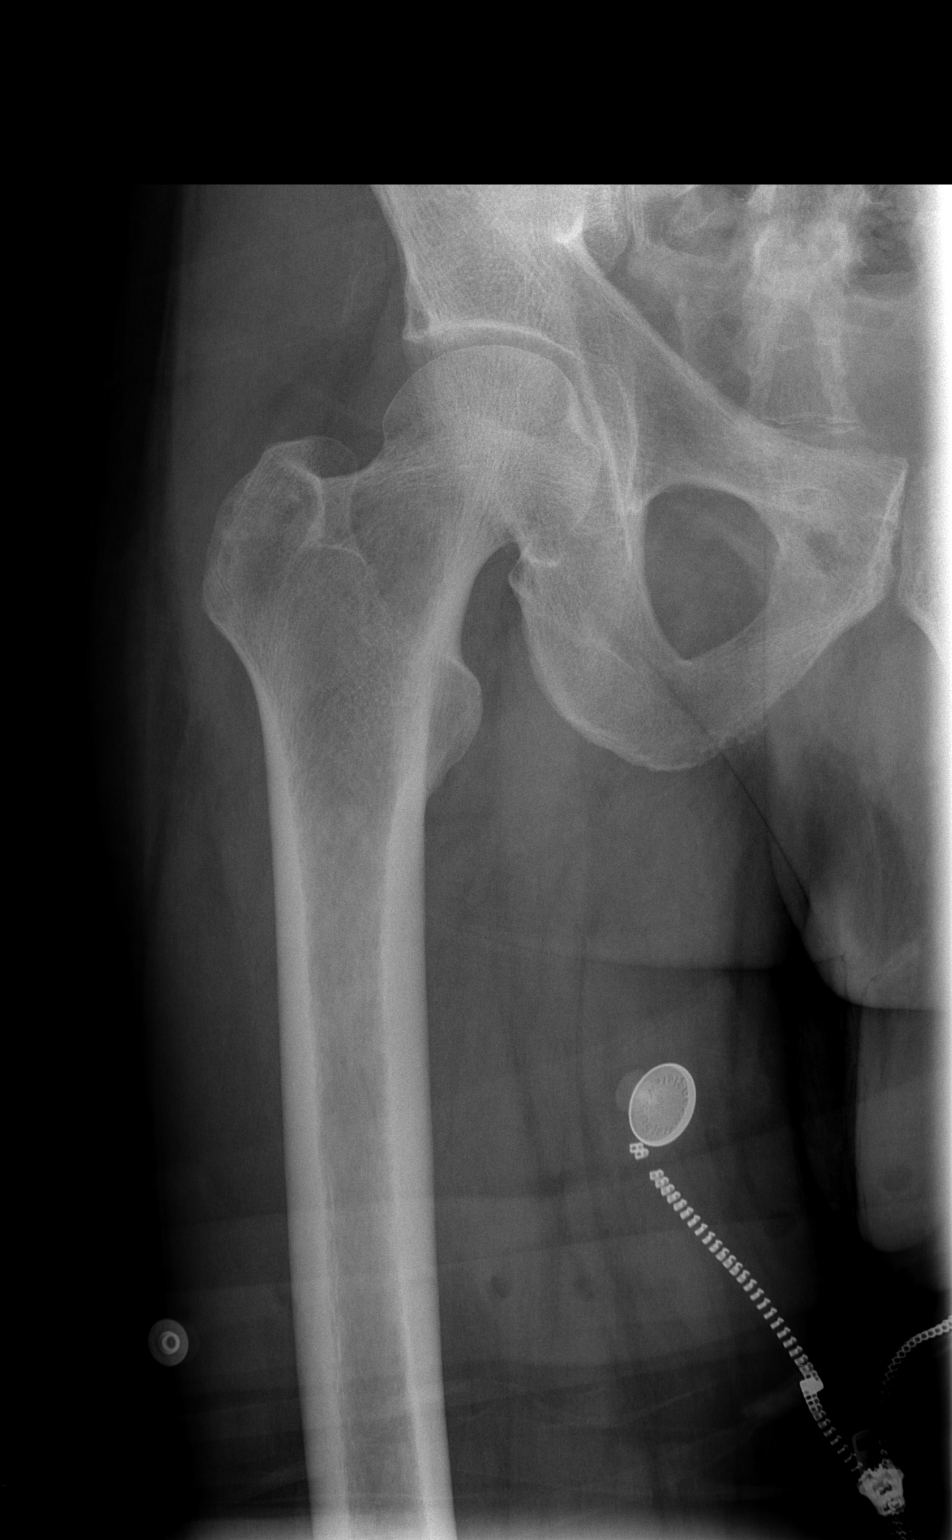

[t hip frog leg right]
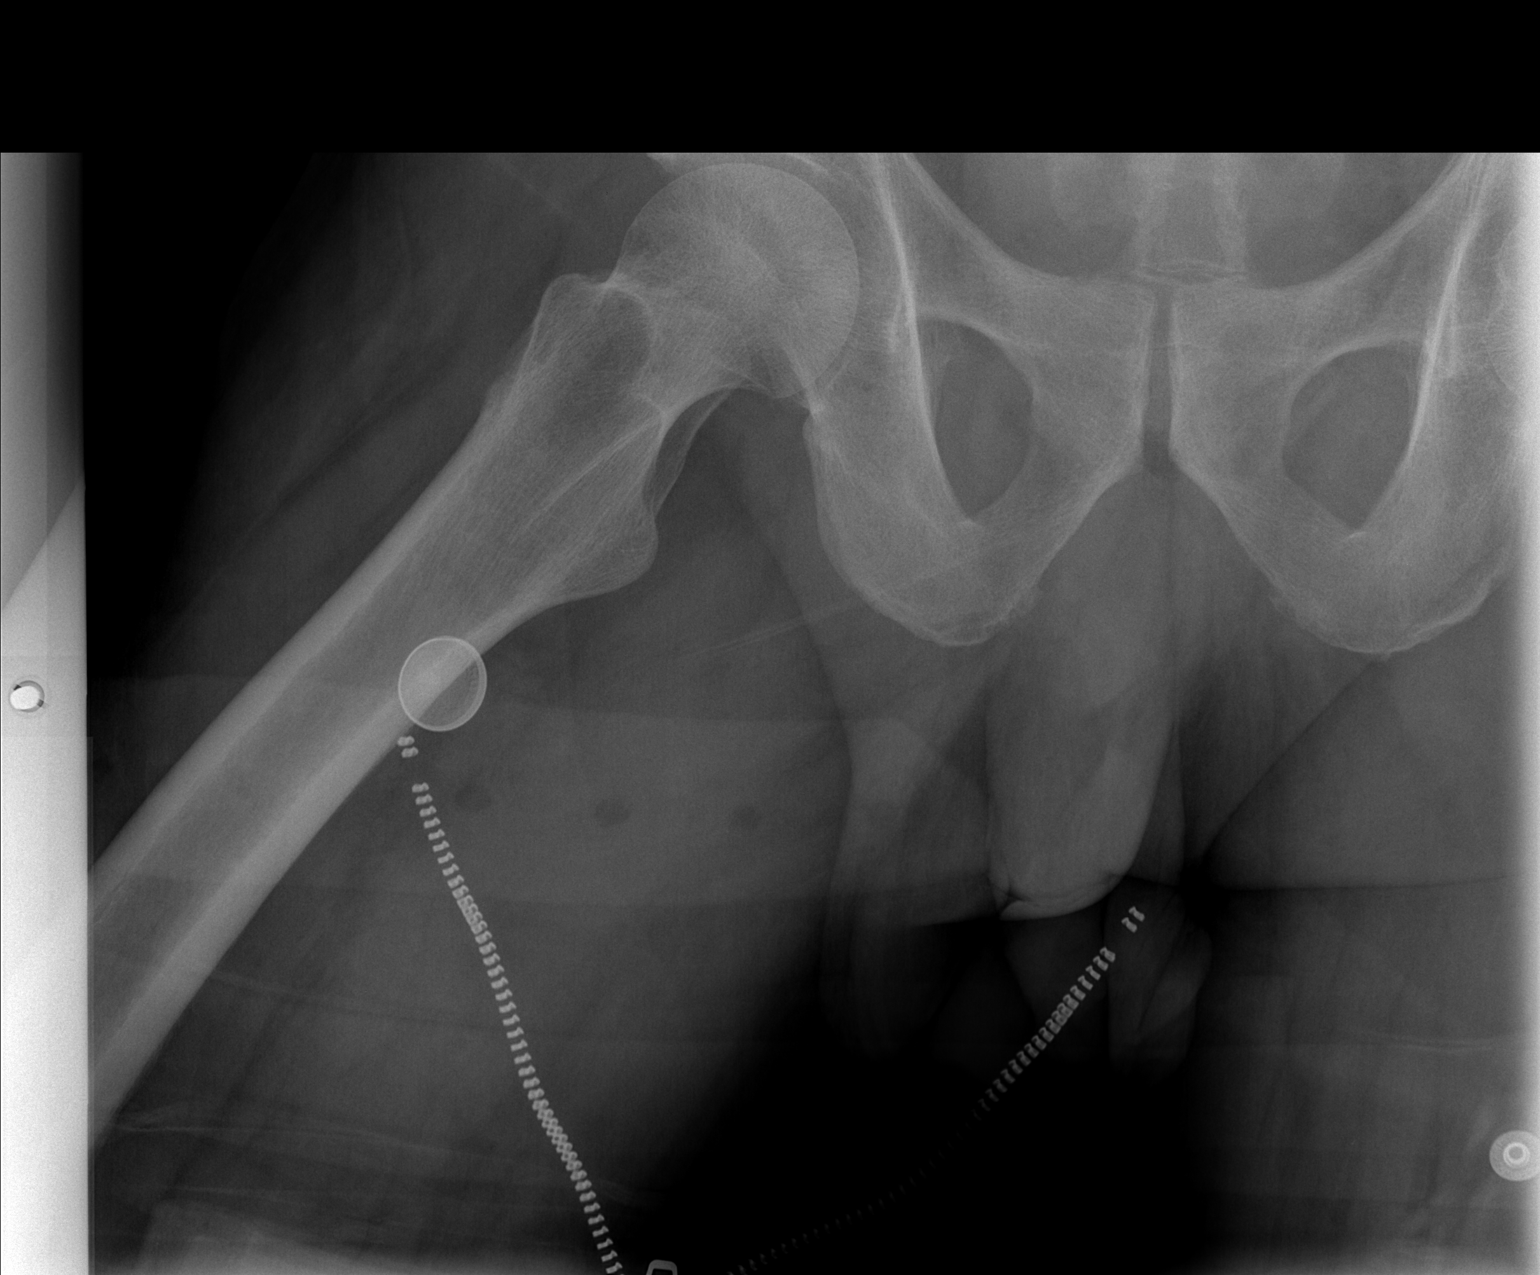

[3 of 3 positions shown; findings below may reference images not displayed]

FINDINGS: Both hips are normally located.

There is no fracture or dislocation identified.

No radio-opaque foreign bodies or soft tissue calcifications.
IMPRESSION: 1.  No acute findings.

## 2011-07-11 LAB — DIFFERENTIAL
Basophils Absolute: 0.1
Basophils Relative: 1
Eosinophils Absolute: 0.2
Lymphocytes Relative: 32
Monocytes Absolute: 1.1 — ABNORMAL HIGH
Neutrophils Relative %: 41 — ABNORMAL LOW

## 2011-07-11 LAB — COMPREHENSIVE METABOLIC PANEL
Albumin: 3 — ABNORMAL LOW
Alkaline Phosphatase: 63
BUN: 7
CO2: 25
Chloride: 105
Creatinine, Ser: 0.62
GFR calc non Af Amer: >60
Glucose, Bld: 95
Potassium: 3.7
Total Bilirubin: 1.3 — ABNORMAL HIGH

## 2011-07-11 LAB — CBC
HCT: 31.5 — ABNORMAL LOW
Hemoglobin: 10 — ABNORMAL LOW
MCV: 74.7 — ABNORMAL LOW
Platelets: 114 — ABNORMAL LOW
RBC: 4.21 — ABNORMAL LOW
WBC: 5.1

## 2011-07-11 LAB — LIPASE, BLOOD: Lipase: 85 — ABNORMAL HIGH

## 2011-07-12 LAB — BASIC METABOLIC PANEL
BUN: 5 — ABNORMAL LOW
Calcium: 8.9
Chloride: 107
Creatinine, Ser: 0.69
GFR calc Af Amer: >60

## 2011-07-12 LAB — DIFFERENTIAL
Basophils Absolute: 0
Basophils Absolute: 0.1
Basophils Absolute: 0
Basophils Relative: 1
Eosinophils Absolute: 0.2
Eosinophils Absolute: 0.2
Eosinophils Relative: 3
Eosinophils Relative: 4
Lymphocytes Relative: 43
Lymphocytes Relative: 48 — ABNORMAL HIGH
Lymphs Abs: 2.8
Monocytes Absolute: 0.6
Monocytes Absolute: 0.6
Monocytes Absolute: 0.7
Neutro Abs: 2
Neutro Abs: 2.2
Neutrophils Relative %: 34 — ABNORMAL LOW
Neutrophils Relative %: 43

## 2011-07-12 LAB — POCT I-STAT, CHEM 8
BUN: 8
Chloride: 107
Creatinine, Ser: 0.9
Glucose, Bld: 85
Potassium: 3.5

## 2011-07-12 LAB — CBC
HCT: 33.3 — ABNORMAL LOW
Hemoglobin: 10.6 — ABNORMAL LOW
Hemoglobin: 11.5 — ABNORMAL LOW
MCV: 75.3 — ABNORMAL LOW
Platelets: 135 — ABNORMAL LOW
Platelets: 139 — ABNORMAL LOW
RBC: 4.38
RBC: 4.31
RDW: 20.9 — ABNORMAL HIGH
WBC: 5.7
WBC: 5.9
WBC: 6
WBC: 6

## 2011-07-12 LAB — COMPREHENSIVE METABOLIC PANEL
ALT: 67 — ABNORMAL HIGH
AST: 134 — ABNORMAL HIGH
Albumin: 3.2 — ABNORMAL LOW
Alkaline Phosphatase: 62
BUN: 8
CO2: 22
CO2: 27
Chloride: 108
Chloride: 107
GFR calc Af Amer: >60
GFR calc non Af Amer: >60
Glucose, Bld: 120 — ABNORMAL HIGH
Potassium: 3.9
Potassium: 3.7
Sodium: 142
Total Bilirubin: 1.3 — ABNORMAL HIGH
Total Bilirubin: 1.9 — ABNORMAL HIGH

## 2011-07-12 LAB — URINALYSIS, ROUTINE W REFLEX MICROSCOPIC
Hgb urine dipstick: NEGATIVE
Hgb urine dipstick: NEGATIVE
Ketones, ur: NEGATIVE
Nitrite: NEGATIVE
Nitrite: NEGATIVE
Protein, ur: NEGATIVE
Protein, ur: NEGATIVE
Protein, ur: NEGATIVE
Specific Gravity, Urine: 1.004 — ABNORMAL LOW
Urobilinogen, UA: 0.2
Urobilinogen, UA: 2 — ABNORMAL HIGH

## 2011-07-12 LAB — HEPATIC FUNCTION PANEL
ALT: 53
AST: 121 — ABNORMAL HIGH
Alkaline Phosphatase: 63
Total Protein: 8.7 — ABNORMAL HIGH

## 2011-07-12 LAB — RAPID URINE DRUG SCREEN, HOSP PERFORMED
Amphetamines: NOT DETECTED
Barbiturates: NOT DETECTED
Benzodiazepines: NOT DETECTED
Cocaine: NOT DETECTED
Tetrahydrocannabinol: NOT DETECTED
Tetrahydrocannabinol: NOT DETECTED

## 2011-07-12 LAB — PROTIME-INR
INR: 1.2
Prothrombin Time: 15.7 — ABNORMAL HIGH

## 2011-07-12 LAB — MAGNESIUM: Magnesium: 1.9

## 2011-07-12 LAB — APTT: aPTT: 33

## 2011-07-12 LAB — ETHANOL
Alcohol, Ethyl (B): 258 — ABNORMAL HIGH
Alcohol, Ethyl (B): 323 — ABNORMAL HIGH
Alcohol, Ethyl (B): 384 — ABNORMAL HIGH
Alcohol, Ethyl (B): 186 — ABNORMAL HIGH

## 2011-07-12 LAB — HIV ANTIBODY (ROUTINE TESTING W REFLEX): HIV: NONREACTIVE

## 2011-07-13 LAB — COMPREHENSIVE METABOLIC PANEL
ALT: 107 — ABNORMAL HIGH
Calcium: 8.5
Creatinine, Ser: 0.63
GFR calc Af Amer: >60
Glucose, Bld: 89
Sodium: 139
Total Protein: 8.8 — ABNORMAL HIGH

## 2011-07-13 LAB — DIFFERENTIAL
Basophils Absolute: 0.1
Eosinophils Relative: 4
Lymphocytes Relative: 42
Lymphs Abs: 2
Monocytes Relative: 10

## 2011-07-13 LAB — CBC
Hemoglobin: 12.2 — ABNORMAL LOW
MCHC: 32.8
MCV: 80.4
RDW: 22.7 — ABNORMAL HIGH

## 2011-07-14 LAB — LIPASE, BLOOD
Lipase: 87 — ABNORMAL HIGH
Lipase: 49

## 2011-07-14 LAB — URINALYSIS, ROUTINE W REFLEX MICROSCOPIC
Bilirubin Urine: NEGATIVE
Nitrite: NEGATIVE
Nitrite: NEGATIVE
Specific Gravity, Urine: 1.015
Specific Gravity, Urine: 1.01
Urobilinogen, UA: 1
pH: 6
pH: 5.5

## 2011-07-14 LAB — DIFFERENTIAL
Basophils Absolute: 0
Basophils Relative: 1
Basophils Relative: 1
Basophils Relative: 1
Eosinophils Absolute: 0.2
Eosinophils Absolute: 0.3
Eosinophils Relative: 2
Eosinophils Relative: 3
Lymphocytes Relative: 37
Lymphocytes Relative: 41
Lymphs Abs: 1.4
Lymphs Abs: 1.7
Monocytes Absolute: 0.5
Monocytes Absolute: 0.6
Monocytes Relative: 9
Monocytes Relative: 11
Neutro Abs: 1.7
Neutro Abs: 2.3

## 2011-07-14 LAB — PHOSPHORUS: Phosphorus: 3.6

## 2011-07-14 LAB — APTT
aPTT: 33
aPTT: 35

## 2011-07-14 LAB — COMPREHENSIVE METABOLIC PANEL
ALT: 63 — ABNORMAL HIGH
AST: 119 — ABNORMAL HIGH
AST: 122 — ABNORMAL HIGH
AST: 88 — ABNORMAL HIGH
Albumin: 2.7 — ABNORMAL LOW
Albumin: 3.4 — ABNORMAL LOW
Alkaline Phosphatase: 58
Alkaline Phosphatase: 71
Alkaline Phosphatase: 62
BUN: 7
CO2: 24
CO2: 24
Calcium: 8.9
Chloride: 105
Chloride: 104
Chloride: 103
GFR calc Af Amer: >60
GFR calc Af Amer: >60
GFR calc Af Amer: >60
GFR calc non Af Amer: >60
GFR calc non Af Amer: >60
Potassium: 3.6
Potassium: 3.3 — ABNORMAL LOW
Potassium: 3.6
Sodium: 138
Total Bilirubin: 3.4 — ABNORMAL HIGH
Total Bilirubin: 1.6 — ABNORMAL HIGH
Total Protein: 8.3

## 2011-07-14 LAB — RAPID URINE DRUG SCREEN, HOSP PERFORMED
Amphetamines: NOT DETECTED
Amphetamines: NOT DETECTED
Barbiturates: NOT DETECTED
Benzodiazepines: POSITIVE — AB
Benzodiazepines: POSITIVE — AB
Cocaine: NOT DETECTED
Opiates: NOT DETECTED
Tetrahydrocannabinol: NOT DETECTED

## 2011-07-14 LAB — BASIC METABOLIC PANEL
BUN: 3 — ABNORMAL LOW
CO2: 25
Calcium: 8.4
Creatinine, Ser: 0.73
GFR calc non Af Amer: >60
Glucose, Bld: 99

## 2011-07-14 LAB — ETHANOL
Alcohol, Ethyl (B): 179 — ABNORMAL HIGH
Alcohol, Ethyl (B): 267 — ABNORMAL HIGH

## 2011-07-14 LAB — CBC
HCT: 32.5 — ABNORMAL LOW
HCT: 34.9 — ABNORMAL LOW
Hemoglobin: 11.7 — ABNORMAL LOW
Hemoglobin: 11.9 — ABNORMAL LOW
MCHC: 32.8
MCHC: 32.8
MCHC: 33.1
MCV: 79.7
MCV: 79.9
Platelets: 38 — CL
Platelets: 44 — CL
Platelets: 75 — ABNORMAL LOW
RBC: 4.06 — ABNORMAL LOW
RBC: 4.42
RDW: 21.1 — ABNORMAL HIGH
RDW: 21.9 — ABNORMAL HIGH
RDW: 19.7 — ABNORMAL HIGH
WBC: 3 — ABNORMAL LOW
WBC: 10.5
WBC: 5.1

## 2011-07-14 LAB — AMYLASE: Amylase: 233 — ABNORMAL HIGH

## 2011-07-14 LAB — HEPATIC FUNCTION PANEL
Albumin: 3.5
Indirect Bilirubin: 1.2 — ABNORMAL HIGH
Total Bilirubin: 1.5 — ABNORMAL HIGH
Total Protein: 8.5 — ABNORMAL HIGH

## 2011-07-14 LAB — POCT I-STAT, CHEM 8
BUN: 7
Creatinine, Ser: 0.7
Glucose, Bld: 101 — ABNORMAL HIGH
Hemoglobin: 12.9 — ABNORMAL LOW
Potassium: 4
Potassium: 3.8
Sodium: 146 — ABNORMAL HIGH

## 2011-07-14 LAB — CARDIAC PANEL(CRET KIN+CKTOT+MB+TROPI)
CK, MB: 1.2
CK, MB: 1.5
Total CK: 166
Total CK: 190

## 2011-07-14 LAB — TRICYCLICS SCREEN, URINE: TCA Scrn: NOT DETECTED

## 2011-07-14 LAB — CK TOTAL AND CKMB (NOT AT ARMC): Total CK: 211

## 2011-07-14 LAB — LIPID PANEL
Total CHOL/HDL Ratio: 2.3
VLDL: 7

## 2011-07-14 LAB — ABO/RH: ABO/RH(D): O POS

## 2011-07-14 LAB — PROTIME-INR
INR: 1.3
Prothrombin Time: 14.9
Prothrombin Time: 16.4 — ABNORMAL HIGH

## 2011-07-14 LAB — TYPE AND SCREEN: ABO/RH(D): O POS

## 2011-07-14 LAB — TROPONIN I: Troponin I: 0.01

## 2011-07-14 LAB — MAGNESIUM: Magnesium: 1.7

## 2011-07-15 LAB — CBC
HCT: 34.5 — ABNORMAL LOW
MCHC: 32.8
MCV: 82.8
RBC: 4.17 — ABNORMAL LOW
WBC: 5.7

## 2011-07-15 LAB — DIFFERENTIAL
Basophils Relative: 2 — ABNORMAL HIGH
Eosinophils Absolute: 0.1
Eosinophils Relative: 2
Lymphs Abs: 2.4
Monocytes Absolute: 0.6
Monocytes Relative: 10

## 2011-07-15 LAB — POCT I-STAT, CHEM 8
Calcium, Ion: 1.14
Creatinine, Ser: 0.9
Glucose, Bld: 91
Hemoglobin: 13.3
TCO2: 27

## 2011-07-15 LAB — RAPID URINE DRUG SCREEN, HOSP PERFORMED
Cocaine: NOT DETECTED
Tetrahydrocannabinol: NOT DETECTED

## 2011-07-18 LAB — CBC
Hemoglobin: 11.8 — ABNORMAL LOW
MCHC: 32.5
Platelets: 132 — ABNORMAL LOW
Platelets: 96 — ABNORMAL LOW
RBC: 4.07 — ABNORMAL LOW
RDW: 19.6 — ABNORMAL HIGH
WBC: 3.9 — ABNORMAL LOW

## 2011-07-18 LAB — LIPID PANEL
HDL: 34 — ABNORMAL LOW
Triglycerides: 35
VLDL: 7

## 2011-07-18 LAB — POCT CARDIAC MARKERS
CKMB, poc: 1.4
Myoglobin, poc: 55.7
Troponin i, poc: <0.05

## 2011-07-18 LAB — AMYLASE
Amylase: 174 — ABNORMAL HIGH
Amylase: 174 — ABNORMAL HIGH

## 2011-07-18 LAB — CARDIAC PANEL(CRET KIN+CKTOT+MB+TROPI)
CK, MB: 0.7
Relative Index: INVALID
Relative Index: INVALID
Total CK: 56
Troponin I: <0.01

## 2011-07-18 LAB — COMPREHENSIVE METABOLIC PANEL
ALT: 51
AST: 78 — ABNORMAL HIGH
AST: 79 — ABNORMAL HIGH
Albumin: 2.8 — ABNORMAL LOW
Albumin: 3.5
Alkaline Phosphatase: 55
Alkaline Phosphatase: 61
Chloride: 104
GFR calc Af Amer: >60
Glucose, Bld: 92
Potassium: 3.6
Potassium: 3.7
Sodium: 134 — ABNORMAL LOW
Sodium: 138
Total Bilirubin: 1.9 — ABNORMAL HIGH
Total Protein: 8.1

## 2011-07-18 LAB — BASIC METABOLIC PANEL
CO2: 25
Calcium: 8.9
Creatinine, Ser: 0.55
GFR calc Af Amer: >60
GFR calc non Af Amer: >60

## 2011-07-18 LAB — CK TOTAL AND CKMB (NOT AT ARMC)
CK, MB: 1.3
Relative Index: 1.1

## 2011-07-18 LAB — RAPID URINE DRUG SCREEN, HOSP PERFORMED
Barbiturates: NOT DETECTED
Benzodiazepines: NOT DETECTED
Cocaine: NOT DETECTED
Opiates: POSITIVE — AB
Tetrahydrocannabinol: NOT DETECTED

## 2011-07-18 LAB — POCT I-STAT, CHEM 8
Calcium, Ion: 1.07 — ABNORMAL LOW
Glucose, Bld: 135 — ABNORMAL HIGH
HCT: 39
Hemoglobin: 13.3

## 2011-07-18 LAB — DIFFERENTIAL
Basophils Absolute: 0
Basophils Relative: 1
Basophils Relative: 1
Eosinophils Absolute: 0.2
Eosinophils Relative: 4
Monocytes Absolute: 0.9
Monocytes Relative: 15 — ABNORMAL HIGH
Monocytes Relative: 19 — ABNORMAL HIGH
Neutro Abs: 2
Neutrophils Relative %: 58

## 2011-07-18 LAB — TRANSFERRIN: Transferrin: 431 — ABNORMAL HIGH

## 2011-07-18 LAB — IRON AND TIBC: Iron: 36 — ABNORMAL LOW

## 2011-07-18 LAB — LIPASE, BLOOD: Lipase: 86 — ABNORMAL HIGH

## 2011-07-18 LAB — PHOSPHORUS: Phosphorus: 3.4

## 2011-07-18 LAB — PROTIME-INR: INR: 1.3

## 2011-07-18 LAB — ETHANOL: Alcohol, Ethyl (B): 223 — ABNORMAL HIGH

## 2011-07-18 LAB — D-DIMER, QUANTITATIVE: D-Dimer, Quant: 0.48
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
## 2011-07-20 LAB — DIFFERENTIAL
Basophils Absolute: 0.1
Basophils Relative: 1
Eosinophils Absolute: 0.1
Eosinophils Relative: 2
Lymphocytes Relative: 40
Lymphs Abs: 2.1
Monocytes Absolute: 0.7
Monocytes Relative: 13 — ABNORMAL HIGH
Neutro Abs: 2.3
Neutrophils Relative %: 43

## 2011-07-20 LAB — ETHANOL
Alcohol, Ethyl (B): 179 — ABNORMAL HIGH
Alcohol, Ethyl (B): 246 — ABNORMAL HIGH

## 2011-07-20 LAB — COMPREHENSIVE METABOLIC PANEL WITH GFR
ALT: 42
AST: 91 — ABNORMAL HIGH
Albumin: 3.2 — ABNORMAL LOW
Alkaline Phosphatase: 55
BUN: 4 — ABNORMAL LOW
CO2: 27
Calcium: 8.5
Chloride: 107
Creatinine, Ser: 0.64
GFR calc Af Amer: >60
GFR calc non Af Amer: >60
Glucose, Bld: 98
Potassium: 3.7
Sodium: 140
Total Bilirubin: 1.3 — ABNORMAL HIGH
Total Protein: 7.3

## 2011-07-20 LAB — LIPASE, BLOOD: Lipase: 75 — ABNORMAL HIGH

## 2011-07-20 LAB — CBC
MCHC: 32.3
Platelets: 119 — ABNORMAL LOW
RDW: 17.7 — ABNORMAL HIGH

## 2011-07-20 LAB — RAPID URINE DRUG SCREEN, HOSP PERFORMED
Barbiturates: NOT DETECTED
Benzodiazepines: NOT DETECTED
Cocaine: NOT DETECTED
Opiates: POSITIVE — AB

## 2011-07-20 LAB — TRICYCLICS SCREEN, URINE: TCA Scrn: NOT DETECTED

## 2011-07-26 LAB — HEPATIC FUNCTION PANEL
ALT: 83 — ABNORMAL HIGH
Bilirubin, Direct: 0.4 — ABNORMAL HIGH
Indirect Bilirubin: 1.1 — ABNORMAL HIGH
Total Protein: 8.1

## 2011-07-26 LAB — COMPREHENSIVE METABOLIC PANEL
ALT: 53
Alkaline Phosphatase: 76
BUN: 2 — ABNORMAL LOW
CO2: 26
GFR calc non Af Amer: >60
Glucose, Bld: 108 — ABNORMAL HIGH
Potassium: 3.3 — ABNORMAL LOW
Sodium: 142

## 2011-07-26 LAB — CBC
HCT: 28.6 — ABNORMAL LOW
HCT: 31.6 — ABNORMAL LOW
Hemoglobin: 9.5 — ABNORMAL LOW
Hemoglobin: 10.4 — ABNORMAL LOW
MCHC: 33.4
MCHC: 32.9
MCV: 76.5 — ABNORMAL LOW
Platelets: 95 — ABNORMAL LOW
RBC: 4.09 — ABNORMAL LOW
RDW: 19.6 — ABNORMAL HIGH

## 2011-07-26 LAB — DIFFERENTIAL
Basophils Absolute: 0
Basophils Absolute: 0
Basophils Relative: 1
Eosinophils Absolute: 0.1 — ABNORMAL LOW
Eosinophils Absolute: 0.2
Eosinophils Relative: 1
Lymphocytes Relative: 43
Monocytes Absolute: 0.4
Neutro Abs: 3.5
Neutrophils Relative %: 51

## 2011-07-26 LAB — MAGNESIUM: Magnesium: 2.1

## 2011-07-26 LAB — BASIC METABOLIC PANEL
BUN: 3 — ABNORMAL LOW
CO2: 26
Chloride: 106
Glucose, Bld: 93
Potassium: 3.5
Sodium: 141

## 2011-07-26 LAB — RAPID URINE DRUG SCREEN, HOSP PERFORMED
Cocaine: POSITIVE — AB
Opiates: NOT DETECTED
Tetrahydrocannabinol: NOT DETECTED

## 2011-07-26 LAB — ETHANOL
Alcohol, Ethyl (B): 182 — ABNORMAL HIGH
Alcohol, Ethyl (B): 368 — ABNORMAL HIGH

## 2011-07-26 LAB — LIPASE, BLOOD
Lipase: 84 — ABNORMAL HIGH
Lipase: 75 — ABNORMAL HIGH

## 2011-07-27 LAB — DIFFERENTIAL
Eosinophils Absolute: 0.1
Eosinophils Relative: 1
Lymphs Abs: 2.6
Monocytes Absolute: 0.4
Monocytes Relative: 7

## 2011-07-27 LAB — CBC
MCHC: 33.4
RBC: 4 — ABNORMAL LOW
RDW: 18.4 — ABNORMAL HIGH

## 2011-07-27 LAB — COMPREHENSIVE METABOLIC PANEL
ALT: 61 — ABNORMAL HIGH
AST: 99 — ABNORMAL HIGH
CO2: 25
Calcium: 8.4
GFR calc Af Amer: >60
Sodium: 137
Total Protein: 8

## 2011-07-27 LAB — RAPID URINE DRUG SCREEN, HOSP PERFORMED
Amphetamines: NOT DETECTED
Benzodiazepines: POSITIVE — AB

## 2011-07-28 LAB — DIFFERENTIAL
Basophils Relative: 1
Basophils Relative: 1
Eosinophils Absolute: 0.2
Eosinophils Relative: 4
Lymphs Abs: 1.6
Lymphs Abs: 2.7
Monocytes Absolute: 0.7
Monocytes Relative: 11
Monocytes Relative: 12 — ABNORMAL HIGH
Neutro Abs: 3.5
Neutrophils Relative %: 60
Neutrophils Relative %: 38 — ABNORMAL LOW

## 2011-07-28 LAB — COMPREHENSIVE METABOLIC PANEL
ALT: 48
ALT: 63 — ABNORMAL HIGH
AST: 83 — ABNORMAL HIGH
AST: 122 — ABNORMAL HIGH
Albumin: 3 — ABNORMAL LOW
Alkaline Phosphatase: 59
Alkaline Phosphatase: 68
BUN: 5 — ABNORMAL LOW
CO2: 28
Calcium: 9
Calcium: 8.6
Creatinine, Ser: 0.78
Creatinine, Ser: 0.75
GFR calc Af Amer: >60
GFR calc Af Amer: >60
Glucose, Bld: 125 — ABNORMAL HIGH
Glucose, Bld: 98
Potassium: 3.5
Potassium: 3.8
Sodium: 135
Sodium: 139
Total Bilirubin: 2.7 — ABNORMAL HIGH
Total Protein: 7.6
Total Protein: 7.3
Total Protein: 8.1

## 2011-07-28 LAB — CBC
HCT: 36.8 — ABNORMAL LOW
HCT: 32.7 — ABNORMAL LOW
Hemoglobin: 12 — ABNORMAL LOW
Hemoglobin: 10.9 — ABNORMAL LOW
Hemoglobin: 11.5 — ABNORMAL LOW
MCHC: 33.1
MCHC: 33.3
MCHC: 32.7
MCHC: 32.9
MCHC: 33.3
MCV: 81.3
MCV: 81.7
Platelets: 101 — ABNORMAL LOW
Platelets: 130 — ABNORMAL LOW
Platelets: 84 — ABNORMAL LOW
RBC: 3.47 — ABNORMAL LOW
RBC: 3.66 — ABNORMAL LOW
RBC: 4.27
RDW: 17.7 — ABNORMAL HIGH
RDW: 17.3 — ABNORMAL HIGH
RDW: 15.9 — ABNORMAL HIGH
RDW: 16.4 — ABNORMAL HIGH
WBC: 5.9

## 2011-07-28 LAB — CULTURE, BLOOD (ROUTINE X 2): Culture: NO GROWTH

## 2011-07-28 LAB — URINALYSIS, ROUTINE W REFLEX MICROSCOPIC
Bilirubin Urine: NEGATIVE
Glucose, UA: NEGATIVE
Hgb urine dipstick: NEGATIVE
Ketones, ur: NEGATIVE
Protein, ur: NEGATIVE
pH: 5

## 2011-07-28 LAB — ANAEROBIC CULTURE

## 2011-07-28 LAB — LIPASE, BLOOD: Lipase: 56

## 2011-07-28 LAB — I-STAT 8, (EC8 V) (CONVERTED LAB)
Chloride: 108
Glucose, Bld: 100 — ABNORMAL HIGH
Hemoglobin: 14.6
Potassium: 3.4 — ABNORMAL LOW
Sodium: 143
pH, Ven: 7.447 — ABNORMAL HIGH

## 2011-07-28 LAB — ETHANOL: Alcohol, Ethyl (B): 241 — ABNORMAL HIGH

## 2011-07-28 LAB — WOUND CULTURE

## 2011-07-29 LAB — DIFFERENTIAL
Basophils Relative: 1
Basophils Relative: 1
Eosinophils Absolute: 0.2
Eosinophils Absolute: 0.3
Eosinophils Relative: 4
Eosinophils Relative: 4
Lymphocytes Relative: 54 — ABNORMAL HIGH
Lymphs Abs: 2.1
Lymphs Abs: 3.4 — ABNORMAL HIGH
Monocytes Absolute: 0.6
Monocytes Relative: 13 — ABNORMAL HIGH
Monocytes Relative: 8
Monocytes Relative: 9
Neutro Abs: 1.8
Neutrophils Relative %: 42 — ABNORMAL LOW
Neutrophils Relative %: 41 — ABNORMAL LOW

## 2011-07-29 LAB — RAPID URINE DRUG SCREEN, HOSP PERFORMED
Amphetamines: NOT DETECTED
Amphetamines: NOT DETECTED
Barbiturates: NOT DETECTED
Barbiturates: NOT DETECTED
Barbiturates: NOT DETECTED
Benzodiazepines: POSITIVE — AB
Benzodiazepines: POSITIVE — AB
Cocaine: NOT DETECTED
Cocaine: NOT DETECTED
Cocaine: NOT DETECTED
Opiates: NOT DETECTED
Opiates: NOT DETECTED
Opiates: NOT DETECTED
Opiates: NOT DETECTED
Tetrahydrocannabinol: NOT DETECTED

## 2011-07-29 LAB — URINALYSIS, ROUTINE W REFLEX MICROSCOPIC
Bilirubin Urine: NEGATIVE
Bilirubin Urine: NEGATIVE
Glucose, UA: NEGATIVE
Glucose, UA: NEGATIVE
Glucose, UA: NEGATIVE
Hgb urine dipstick: NEGATIVE
Hgb urine dipstick: NEGATIVE
Ketones, ur: NEGATIVE
Ketones, ur: NEGATIVE
Protein, ur: NEGATIVE
Specific Gravity, Urine: 1.016
pH: 6
pH: 6
pH: 5.5

## 2011-07-29 LAB — CBC
HCT: 32.6 — ABNORMAL LOW
HCT: 32.8 — ABNORMAL LOW
Hemoglobin: 11 — ABNORMAL LOW
MCHC: 33.3
MCHC: 33.4
MCV: 84.2
MCV: 84.9
Platelets: 104 — ABNORMAL LOW
Platelets: 114 — ABNORMAL LOW
Platelets: 104 — ABNORMAL LOW
Platelets: 106 — ABNORMAL LOW
RBC: 3.83 — ABNORMAL LOW
RBC: 3.86 — ABNORMAL LOW
RDW: 16.2 — ABNORMAL HIGH
WBC: 4.4
WBC: 6.4

## 2011-07-29 LAB — I-STAT 8, (EC8 V) (CONVERTED LAB)
BUN: <3 — ABNORMAL LOW
Bicarbonate: 23.9
Glucose, Bld: 90
Hemoglobin: 13.9
Sodium: 142
pCO2, Ven: 40.1 — ABNORMAL LOW

## 2011-07-29 LAB — BASIC METABOLIC PANEL
BUN: 2 — ABNORMAL LOW
BUN: 3 — ABNORMAL LOW
CO2: 25
CO2: 27
Calcium: 8.5
Chloride: 110
Chloride: 107
Chloride: 111
Creatinine, Ser: 0.68
GFR calc Af Amer: >60
GFR calc non Af Amer: >60
Glucose, Bld: 93
Potassium: 3.6
Potassium: 3.6
Potassium: 3.7
Sodium: 143
Sodium: 144

## 2011-07-29 LAB — POCT CARDIAC MARKERS
CKMB, poc: <1 — ABNORMAL LOW
Myoglobin, poc: 60.1

## 2011-07-29 LAB — HEPATIC FUNCTION PANEL
ALT: 34
ALT: 43
AST: 79 — ABNORMAL HIGH
AST: 99 — ABNORMAL HIGH
AST: 138 — ABNORMAL HIGH
Albumin: 3.3 — ABNORMAL LOW
Albumin: 3.1 — ABNORMAL LOW
Bilirubin, Direct: 0.3
Bilirubin, Direct: 0.5 — ABNORMAL HIGH
Total Bilirubin: 1.8 — ABNORMAL HIGH
Total Bilirubin: 1.4 — ABNORMAL HIGH
Total Protein: 8

## 2011-07-29 LAB — COMPREHENSIVE METABOLIC PANEL
ALT: 32
Calcium: 8.7
Creatinine, Ser: 0.73
Glucose, Bld: 81
Sodium: 142
Total Protein: 7.4

## 2011-07-29 LAB — ETHANOL
Alcohol, Ethyl (B): 195 — ABNORMAL HIGH
Alcohol, Ethyl (B): 216 — ABNORMAL HIGH
Alcohol, Ethyl (B): 217 — ABNORMAL HIGH
Alcohol, Ethyl (B): 263 — ABNORMAL HIGH

## 2011-07-29 LAB — LIPASE, BLOOD: Lipase: 81 — ABNORMAL HIGH

## 2011-07-29 LAB — POCT I-STAT CREATININE: Creatinine, Ser: 1

## 2011-08-01 LAB — BASIC METABOLIC PANEL
BUN: 2 — ABNORMAL LOW
Chloride: 110
Creatinine, Ser: 0.66
GFR calc Af Amer: >60
GFR calc Af Amer: >60
GFR calc non Af Amer: >60
GFR calc non Af Amer: >60
Potassium: 3.3 — ABNORMAL LOW
Potassium: 3.6

## 2011-08-01 LAB — RAPID URINE DRUG SCREEN, HOSP PERFORMED
Amphetamines: NOT DETECTED
Amphetamines: NOT DETECTED
Amphetamines: NOT DETECTED
Amphetamines: NOT DETECTED
Amphetamines: NOT DETECTED
Amphetamines: NOT DETECTED
Barbiturates: NOT DETECTED
Barbiturates: NOT DETECTED
Barbiturates: NOT DETECTED
Barbiturates: NOT DETECTED
Benzodiazepines: POSITIVE — AB
Benzodiazepines: POSITIVE — AB
Benzodiazepines: POSITIVE — AB
Benzodiazepines: POSITIVE — AB
Benzodiazepines: POSITIVE — AB
Cocaine: NOT DETECTED
Cocaine: NOT DETECTED
Cocaine: NOT DETECTED
Cocaine: NOT DETECTED
Opiates: NOT DETECTED
Opiates: NOT DETECTED
Opiates: NOT DETECTED
Tetrahydrocannabinol: NOT DETECTED
Tetrahydrocannabinol: NOT DETECTED
Tetrahydrocannabinol: NOT DETECTED
Tetrahydrocannabinol: NOT DETECTED

## 2011-08-01 LAB — CBC
HCT: 32.1 — ABNORMAL LOW
Hemoglobin: 10.3 — ABNORMAL LOW
Hemoglobin: 10.4 — ABNORMAL LOW
Hemoglobin: 10.8 — ABNORMAL LOW
Hemoglobin: 11 — ABNORMAL LOW
MCHC: 33.4
MCHC: 33.6
MCHC: 34.4
MCHC: 34.8
MCV: 89
MCV: 90.3
Platelets: 116 — ABNORMAL LOW
Platelets: 103 — ABNORMAL LOW
Platelets: 120 — ABNORMAL LOW
RBC: 3.39 — ABNORMAL LOW
RBC: 3.43 — ABNORMAL LOW
RBC: 3.43 — ABNORMAL LOW
RBC: 3.61 — ABNORMAL LOW
RBC: 3.62 — ABNORMAL LOW
RBC: 3.69 — ABNORMAL LOW
RDW: 14.5 — ABNORMAL HIGH
RDW: 15 — ABNORMAL HIGH
WBC: 6.1
WBC: 3.7 — ABNORMAL LOW
WBC: 4.8
WBC: 5

## 2011-08-01 LAB — COMPREHENSIVE METABOLIC PANEL WITH GFR
ALT: 104 — ABNORMAL HIGH
AST: 183 — ABNORMAL HIGH
Alkaline Phosphatase: 69
CO2: 25
Calcium: 8.3 — ABNORMAL LOW
GFR calc Af Amer: >60
GFR calc non Af Amer: >60
Potassium: 3.5
Sodium: 140
Total Protein: 7.8

## 2011-08-01 LAB — URINALYSIS, ROUTINE W REFLEX MICROSCOPIC
Bilirubin Urine: NEGATIVE
Hgb urine dipstick: NEGATIVE
Ketones, ur: NEGATIVE
Nitrite: NEGATIVE
Nitrite: NEGATIVE
Protein, ur: NEGATIVE
Specific Gravity, Urine: 1.014
Specific Gravity, Urine: 1.005
Urobilinogen, UA: 0.2
Urobilinogen, UA: 0.2
pH: 5

## 2011-08-01 LAB — COMPREHENSIVE METABOLIC PANEL
ALT: 64 — ABNORMAL HIGH
ALT: 132 — ABNORMAL HIGH
ALT: 76 — ABNORMAL HIGH
ALT: 95 — ABNORMAL HIGH
AST: 107 — ABNORMAL HIGH
AST: 175 — ABNORMAL HIGH
AST: 287 — ABNORMAL HIGH
Albumin: 3.1 — ABNORMAL LOW
Albumin: 3 — ABNORMAL LOW
Alkaline Phosphatase: 65
Alkaline Phosphatase: 60
Alkaline Phosphatase: 65
BUN: 3 — ABNORMAL LOW
CO2: 23
CO2: 25
CO2: 26
Calcium: 8.4
Calcium: 8.4
Calcium: 8.7
Chloride: 110
Chloride: 107
Chloride: 112
Creatinine, Ser: 0.73
Creatinine, Ser: 0.63
Creatinine, Ser: 0.72
GFR calc Af Amer: >60
GFR calc Af Amer: >60
GFR calc Af Amer: >60
GFR calc Af Amer: >60
GFR calc non Af Amer: >60
GFR calc non Af Amer: >60
GFR calc non Af Amer: >60
Glucose, Bld: 108 — ABNORMAL HIGH
Glucose, Bld: 114 — ABNORMAL HIGH
Glucose, Bld: 117 — ABNORMAL HIGH
Glucose, Bld: 98
Potassium: 3.6
Potassium: 3.4 — ABNORMAL LOW
Potassium: 3.9
Sodium: 140
Sodium: 135
Sodium: 143
Sodium: 144
Total Bilirubin: 0.9
Total Bilirubin: 1.2
Total Bilirubin: 2.2 — ABNORMAL HIGH
Total Protein: 7.9

## 2011-08-01 LAB — ETHANOL
Alcohol, Ethyl (B): 196 — ABNORMAL HIGH
Alcohol, Ethyl (B): 357 — ABNORMAL HIGH
Alcohol, Ethyl (B): 214 — ABNORMAL HIGH
Alcohol, Ethyl (B): 326 — ABNORMAL HIGH
Alcohol, Ethyl (B): 334 — ABNORMAL HIGH
Alcohol, Ethyl (B): 348 — ABNORMAL HIGH

## 2011-08-01 LAB — I-STAT 8, (EC8 V) (CONVERTED LAB)
BUN: 6
Bicarbonate: 25.3 — ABNORMAL HIGH
Glucose, Bld: 100 — ABNORMAL HIGH
Potassium: 3.8
Sodium: 148 — ABNORMAL HIGH
pH, Ven: 7.402 — ABNORMAL HIGH

## 2011-08-01 LAB — HEPATIC FUNCTION PANEL
ALT: 86 — ABNORMAL HIGH
Indirect Bilirubin: 0.7
Total Protein: 7.2

## 2011-08-01 LAB — DIFFERENTIAL
Basophils Absolute: 0.1
Basophils Absolute: 0.1
Basophils Relative: 1
Basophils Relative: 1
Eosinophils Absolute: 0.2
Eosinophils Absolute: 0.3
Eosinophils Absolute: 0.4
Eosinophils Relative: 6 — ABNORMAL HIGH
Eosinophils Relative: 4
Eosinophils Relative: 6 — ABNORMAL HIGH
Eosinophils Relative: 8 — ABNORMAL HIGH
Lymphocytes Relative: 45
Lymphocytes Relative: 41
Lymphocytes Relative: 49 — ABNORMAL HIGH
Lymphs Abs: 1.8
Lymphs Abs: 1.9
Lymphs Abs: 2
Lymphs Abs: 2.2
Lymphs Abs: 2.5
Monocytes Absolute: 0.8 — ABNORMAL HIGH
Monocytes Absolute: 0.4
Monocytes Absolute: 0.6
Monocytes Relative: 10
Monocytes Relative: 11
Monocytes Relative: 11
Monocytes Relative: 12 — ABNORMAL HIGH
Neutro Abs: 1.2 — ABNORMAL LOW
Neutro Abs: 1.5 — ABNORMAL LOW
Neutro Abs: 1.7
Neutrophils Relative %: 30 — ABNORMAL LOW
Neutrophils Relative %: 31 — ABNORMAL LOW
Neutrophils Relative %: 38 — ABNORMAL LOW
Neutrophils Relative %: 38 — ABNORMAL LOW

## 2011-08-01 LAB — PROTIME-INR
INR: 1.2
Prothrombin Time: 15.2
Prothrombin Time: 15.5 — ABNORMAL HIGH

## 2011-08-01 LAB — BASIC METABOLIC PANEL WITH GFR
BUN: <1 — ABNORMAL LOW
CO2: 21
Calcium: 8.5
Glucose, Bld: 115 — ABNORMAL HIGH
Sodium: 140

## 2011-08-01 LAB — APTT: aPTT: 32

## 2011-08-01 LAB — LIPASE, BLOOD
Lipase: 63 — ABNORMAL HIGH
Lipase: 90 — ABNORMAL HIGH

## 2011-08-01 LAB — TSH: TSH: 6.616 — ABNORMAL HIGH

## 2011-08-01 LAB — POCT I-STAT CREATININE: Operator id: 189501

## 2011-08-02 LAB — COMPREHENSIVE METABOLIC PANEL
ALT: 42
ALT: 63 — ABNORMAL HIGH
AST: 108 — ABNORMAL HIGH
Albumin: 2.9 — ABNORMAL LOW
Alkaline Phosphatase: 54
BUN: 4 — ABNORMAL LOW
BUN: <1 — ABNORMAL LOW
CO2: 26
CO2: 27
CO2: 28
Calcium: 8.3 — ABNORMAL LOW
Calcium: 8.8
Chloride: 109
Chloride: 112
Creatinine, Ser: 0.71
Creatinine, Ser: 0.76
GFR calc Af Amer: >60
GFR calc non Af Amer: >60
GFR calc non Af Amer: >60
Glucose, Bld: 87
Potassium: 3.4 — ABNORMAL LOW
Sodium: 139
Sodium: 143
Total Bilirubin: 0.8
Total Bilirubin: 1
Total Protein: 7.1

## 2011-08-02 LAB — DIFFERENTIAL
Basophils Absolute: 0.1
Basophils Absolute: 0.1
Eosinophils Absolute: 0.2
Eosinophils Relative: 3
Lymphocytes Relative: 32
Lymphocytes Relative: 48 — ABNORMAL HIGH
Lymphocytes Relative: 51 — ABNORMAL HIGH
Lymphs Abs: 3.1
Lymphs Abs: 3.8 — ABNORMAL HIGH
Monocytes Absolute: 0.6
Monocytes Absolute: 0.7
Monocytes Absolute: 1.1 — ABNORMAL HIGH
Monocytes Relative: 15 — ABNORMAL HIGH
Neutro Abs: 2.3
Neutro Abs: 3.3
Neutrophils Relative %: 55

## 2011-08-02 LAB — CBC
HCT: 28.5 — ABNORMAL LOW
HCT: 28.8 — ABNORMAL LOW
HCT: 31.1 — ABNORMAL LOW
Hemoglobin: 10.4 — ABNORMAL LOW
Hemoglobin: 10.6 — ABNORMAL LOW
Hemoglobin: 10.6 — ABNORMAL LOW
MCHC: 33.2
MCHC: 33.4
MCV: 91.3
MCV: 92.6
MCV: 93.8
MCV: 94
Platelets: 169
Platelets: 196
RBC: 3.03 — ABNORMAL LOW
RBC: 3.16 — ABNORMAL LOW
RBC: 3.3 — ABNORMAL LOW
RBC: 3.35 — ABNORMAL LOW
RBC: 3.36 — ABNORMAL LOW
RBC: 3.45 — ABNORMAL LOW
RDW: 14.3 — ABNORMAL HIGH
RDW: 14.4 — ABNORMAL HIGH
RDW: 14.6 — ABNORMAL HIGH
RDW: 14.9 — ABNORMAL HIGH
WBC: 4.1
WBC: 5.5
WBC: 6.5
WBC: 7.5

## 2011-08-02 LAB — URINALYSIS, ROUTINE W REFLEX MICROSCOPIC
Bilirubin Urine: NEGATIVE
Glucose, UA: NEGATIVE
Ketones, ur: NEGATIVE
Nitrite: NEGATIVE
Protein, ur: NEGATIVE
Specific Gravity, Urine: 1.013
Urobilinogen, UA: 0.2
pH: 5

## 2011-08-02 LAB — HEPATIC FUNCTION PANEL
ALT: 35
ALT: 39
ALT: 68 — ABNORMAL HIGH
AST: 62 — ABNORMAL HIGH
AST: 84 — ABNORMAL HIGH
Albumin: 2.8 — ABNORMAL LOW
Alkaline Phosphatase: 49
Alkaline Phosphatase: 50
Bilirubin, Direct: 0.3
Bilirubin, Direct: 0.4 — ABNORMAL HIGH
Indirect Bilirubin: 0.9
Indirect Bilirubin: 0.9
Total Protein: 6.8
Total Protein: 8

## 2011-08-02 LAB — I-STAT 8, (EC8 V) (CONVERTED LAB)
BUN: 3 — ABNORMAL LOW
Chloride: 109
Glucose, Bld: 76
Hemoglobin: 12.2 — ABNORMAL LOW
Operator id: 151321
Potassium: 3.4 — ABNORMAL LOW
Sodium: 145

## 2011-08-02 LAB — BASIC METABOLIC PANEL
BUN: 4 — ABNORMAL LOW
CO2: 27
Calcium: 8.7
Calcium: 8.8
Chloride: 102
Creatinine, Ser: 0.76
GFR calc Af Amer: >60
GFR calc Af Amer: >60
GFR calc non Af Amer: >60
Glucose, Bld: 102 — ABNORMAL HIGH
Potassium: 3.5
Sodium: 144

## 2011-08-02 LAB — ETHANOL: Alcohol, Ethyl (B): 164 — ABNORMAL HIGH

## 2011-08-02 LAB — RAPID URINE DRUG SCREEN, HOSP PERFORMED
Amphetamines: NOT DETECTED
Benzodiazepines: POSITIVE — AB
Cocaine: NOT DETECTED
Opiates: NOT DETECTED
Tetrahydrocannabinol: NOT DETECTED
Tetrahydrocannabinol: NOT DETECTED

## 2011-08-02 LAB — PHOSPHORUS: Phosphorus: 4

## 2011-08-02 LAB — CULTURE, BLOOD (ROUTINE X 2): Culture: NO GROWTH

## 2011-08-02 LAB — HEMOGLOBIN AND HEMATOCRIT, BLOOD
HCT: 28.2 — ABNORMAL LOW
Hemoglobin: 10.1 — ABNORMAL LOW

## 2011-08-02 LAB — URINE CULTURE

## 2011-08-02 LAB — POCT I-STAT CREATININE
Creatinine, Ser: 0.8
Operator id: 151321

## 2011-08-02 LAB — APTT: aPTT: 27

## 2011-08-02 LAB — LIPASE, BLOOD: Lipase: 54

## 2011-08-03 LAB — CBC
HCT: 31.8 — ABNORMAL LOW
HCT: 33.8 — ABNORMAL LOW
Hemoglobin: 13.8
Hemoglobin: 9.6 — ABNORMAL LOW
MCHC: 33.7
MCHC: 33.8
MCHC: 34.3
MCV: 91
MCV: 93.1
MCV: 93.8
Platelets: 64 — ABNORMAL LOW
RBC: 3.03 — ABNORMAL LOW
RBC: 3.41 — ABNORMAL LOW
RBC: 4.42
WBC: 7.1
WBC: 8.4

## 2011-08-03 LAB — COMPREHENSIVE METABOLIC PANEL
ALT: 70 — ABNORMAL HIGH
AST: 115 — ABNORMAL HIGH
Albumin: 2.8 — ABNORMAL LOW
Alkaline Phosphatase: 43
BUN: 22
CO2: 25
Calcium: 8.6
Chloride: 108
Chloride: 99
Creatinine, Ser: 0.8
GFR calc Af Amer: >60
GFR calc Af Amer: >60
GFR calc non Af Amer: >60
Potassium: 4
Sodium: 133 — ABNORMAL LOW
Total Bilirubin: 3.2 — ABNORMAL HIGH
Total Bilirubin: 3.3 — ABNORMAL HIGH
Total Protein: 7.3

## 2011-08-03 LAB — LIPASE, BLOOD: Lipase: 60 — ABNORMAL HIGH

## 2011-08-03 LAB — CROSSMATCH: ABO/RH(D): O POS

## 2011-08-03 LAB — AFP TUMOR MARKER: AFP-Tumor Marker: 6

## 2011-08-03 LAB — DIFFERENTIAL
Basophils Relative: 1
Eosinophils Absolute: 0
Monocytes Absolute: 0.2
Monocytes Relative: 3

## 2011-08-03 LAB — BASIC METABOLIC PANEL
CO2: 23
Chloride: 108
GFR calc Af Amer: >60
Sodium: 136

## 2011-08-03 LAB — PREPARE PLATELET PHERESIS

## 2011-08-03 LAB — HEPATIC FUNCTION PANEL
Bilirubin, Direct: 1 — ABNORMAL HIGH
Total Bilirubin: 3 — ABNORMAL HIGH

## 2011-08-03 LAB — ABO/RH: ABO/RH(D): O POS

## 2011-08-03 LAB — CULTURE, BLOOD (ROUTINE X 2)

## 2011-08-03 LAB — PROTIME-INR
INR: 1.2
Prothrombin Time: 15.8 — ABNORMAL HIGH
Prothrombin Time: 17.5 — ABNORMAL HIGH

## 2011-08-03 LAB — HEMOGLOBIN AND HEMATOCRIT, BLOOD
HCT: 34.2 — ABNORMAL LOW
HCT: 36.7 — ABNORMAL LOW
Hemoglobin: 11.5 — ABNORMAL LOW

## 2011-08-03 LAB — I-STAT 8, (EC8 V) (CONVERTED LAB)
Acid-base deficit: 2
Potassium: 4.1
TCO2: 20
pCO2, Ven: 24.7 — ABNORMAL LOW
pH, Ven: 7.499 — ABNORMAL HIGH

## 2011-08-03 LAB — URINE CULTURE
Colony Count: NO GROWTH
Culture: NO GROWTH

## 2011-08-03 LAB — URINALYSIS, ROUTINE W REFLEX MICROSCOPIC
Bilirubin Urine: NEGATIVE
Hgb urine dipstick: NEGATIVE
Nitrite: NEGATIVE
Protein, ur: NEGATIVE
Specific Gravity, Urine: 1.007 (ref 1.005–1.035)
Urobilinogen, UA: 4 — ABNORMAL HIGH
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
## 2011-09-29 IMAGING — CR DG TIBIA/FIBULA 2V*R*
4 series · 4 of 4 positions shown · non-contrast
Comparison: Plain films 09/02/2009.

CLINICAL DATA: Leg pain.

RIGHT TIBIA AND FIBULA - 2 VIEW

[t tib/fib ap right (1 of 3)]
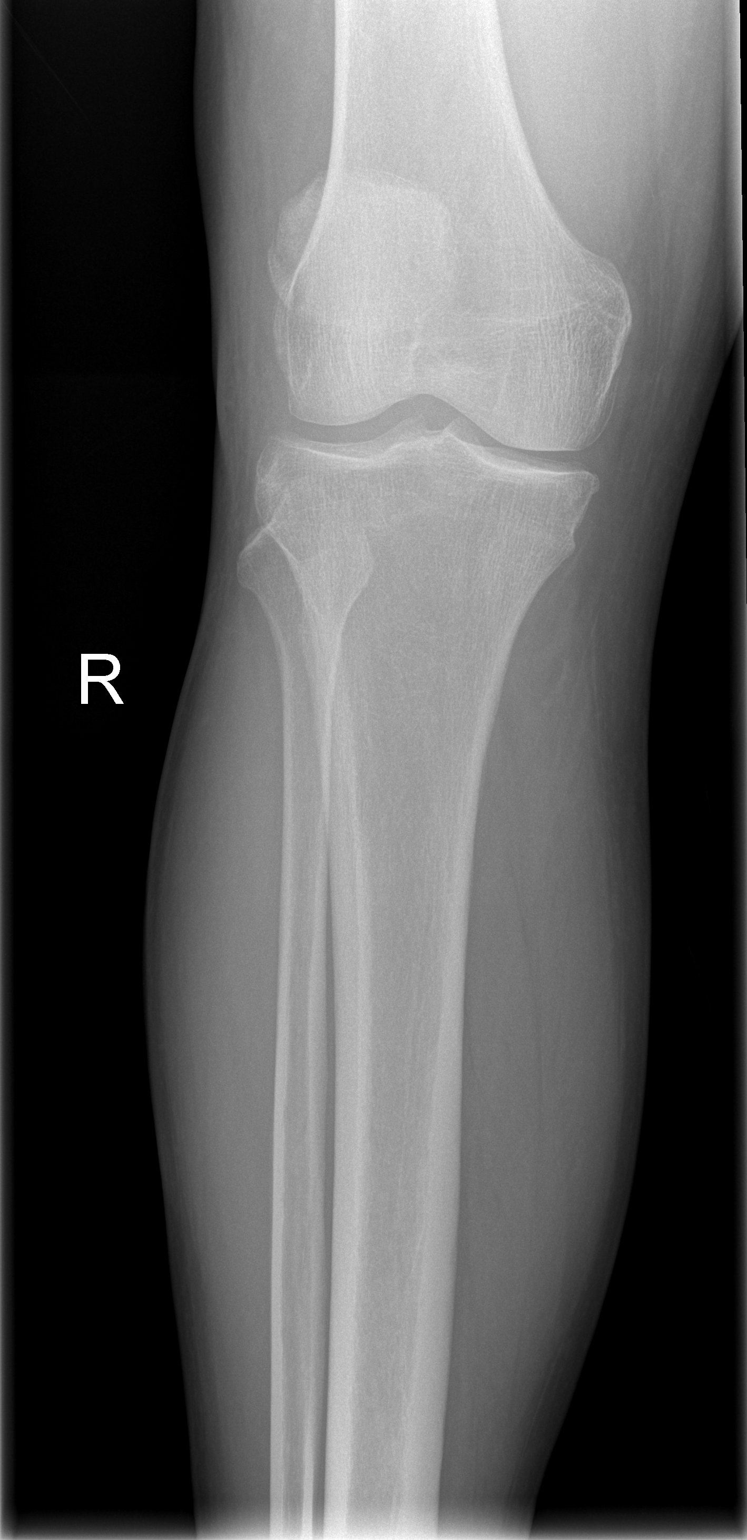

[t tib/fib lat right]
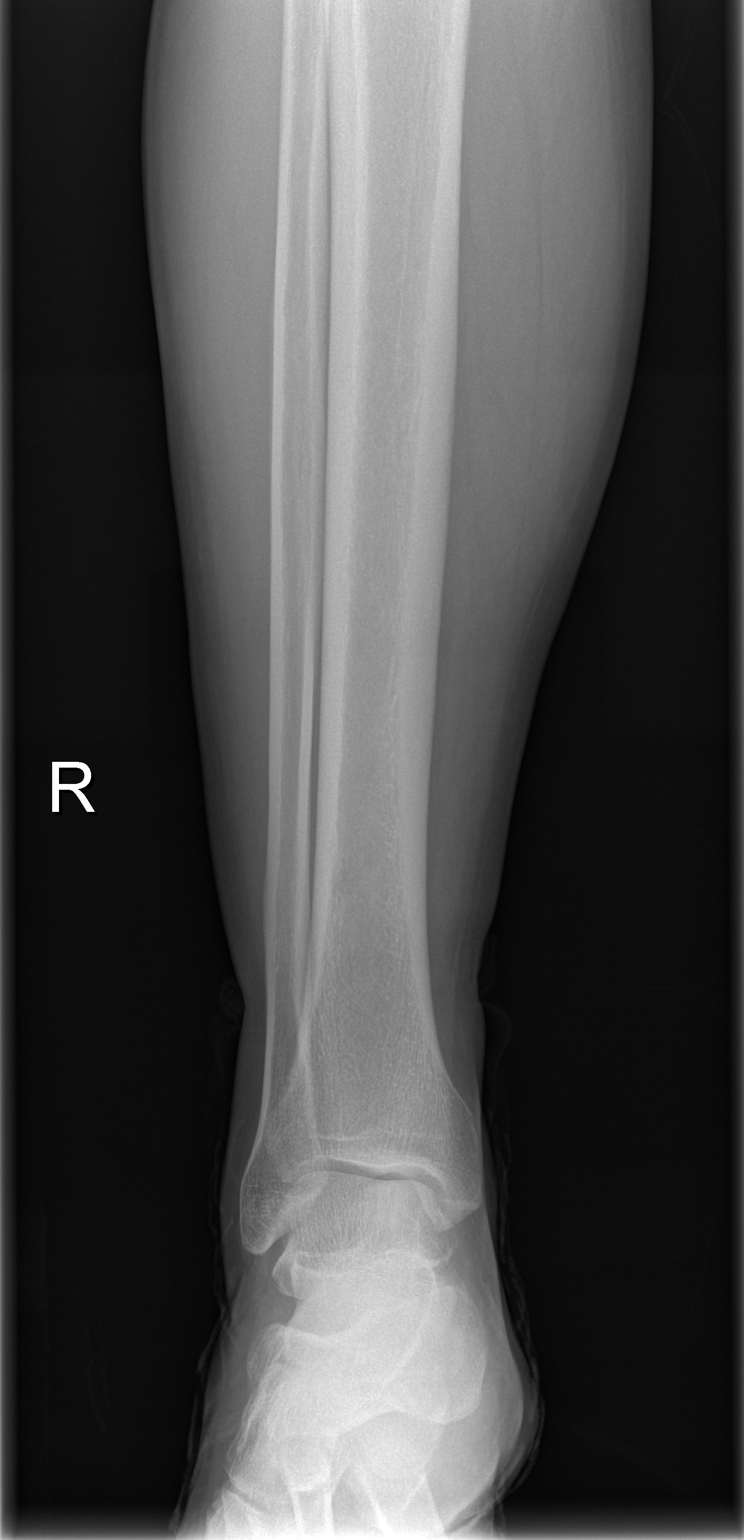

[t tib/fib ap right (2 of 3)]
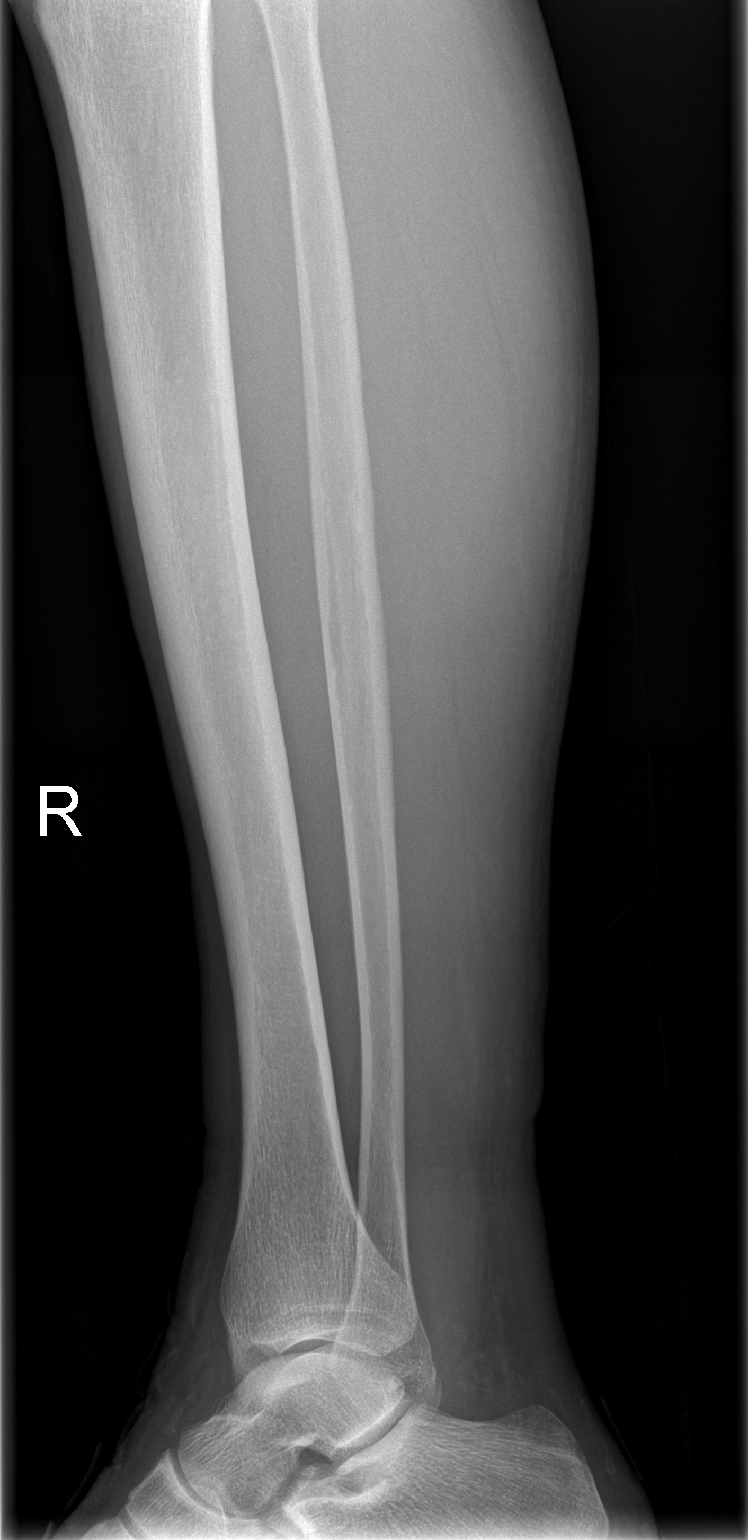

[t tib/fib ap right (3 of 3)]
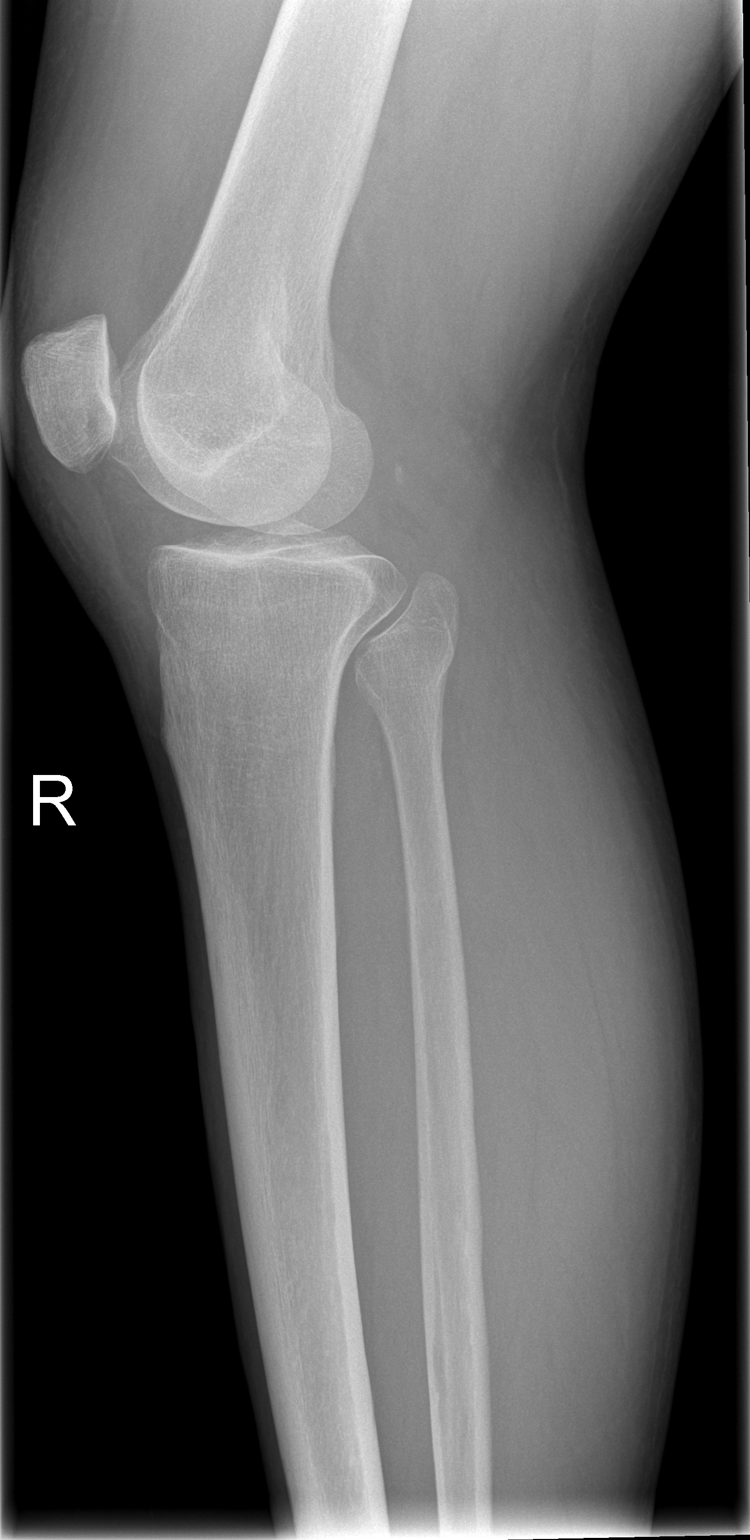

[4 of 4 positions shown; findings below may reference images not displayed]

FINDINGS: Known patellar fracture is again identified.  There is no
other acute bony or joint abnormality.
IMPRESSION: Patellar fracture.  No other acute finding.

## 2011-10-23 ENCOUNTER — Emergency Department (HOSPITAL_COMMUNITY)
Admission: EM | Admit: 2011-10-23 | Discharge: 2011-10-24 | Disposition: A | Discharge status: Home / Self Care | Payer: Medicaid Other | Attending: Internal Medicine | Admitting: Internal Medicine

## 2011-10-23 ENCOUNTER — Encounter (HOSPITAL_COMMUNITY): Payer: Self-pay | Admitting: *Deleted

## 2011-10-23 DIAGNOSIS — F10929 Alcohol use, unspecified with intoxication, unspecified: Secondary | ICD-10-CM

## 2011-10-23 DIAGNOSIS — F17200 Nicotine dependence, unspecified, uncomplicated: Secondary | ICD-10-CM | POA: Insufficient documentation

## 2011-10-23 DIAGNOSIS — R45851 Suicidal ideations: Secondary | ICD-10-CM | POA: Insufficient documentation

## 2011-10-23 DIAGNOSIS — F1010 Alcohol abuse, uncomplicated: Secondary | ICD-10-CM | POA: Insufficient documentation

## 2011-10-23 LAB — DIFFERENTIAL
Basophils Relative: 2 % — ABNORMAL HIGH (ref 0–1)
Eosinophils Relative: 2 % (ref 0–5)
Monocytes Absolute: 0.5 10*3/uL (ref 0.1–1.0)
Monocytes Relative: 12 % (ref 3–12)
Neutrophils Relative %: 37 % — ABNORMAL LOW (ref 43–77)

## 2011-10-23 LAB — BASIC METABOLIC PANEL
CO2: 23 mEq/L (ref 19–32)
Calcium: 9.1 mg/dL (ref 8.4–10.5)
Chloride: 103 mEq/L (ref 96–112)
Glucose, Bld: 98 mg/dL (ref 70–99)
Sodium: 139 mEq/L (ref 135–145)

## 2011-10-23 LAB — CBC
Hemoglobin: 14.6 g/dL (ref 13.0–17.0)
MCH: 31 pg (ref 26.0–34.0)
MCV: 89 fL (ref 78.0–100.0)
RBC: 4.71 MIL/uL (ref 4.22–5.81)

## 2011-10-23 MED ORDER — ZIPRASIDONE MESYLATE 20 MG IM SOLR
INTRAMUSCULAR | Status: AC
  Administered 2011-10-23: 20 mg via INTRAMUSCULAR
  Filled 2011-10-23: qty 20

## 2011-10-23 MED ORDER — ZIPRASIDONE MESYLATE 20 MG IM SOLR
20.0000 mg | Freq: Once | INTRAMUSCULAR | Status: AC
  Administered 2011-10-23: 20 mg via INTRAMUSCULAR

## 2011-10-23 MED ORDER — LORAZEPAM 1 MG PO TABS
2.0000 mg | ORAL_TABLET | Freq: Once | ORAL | Status: AC
  Filled 2011-10-23: qty 2

## 2011-10-23 NOTE — ED Notes (Signed)
Restraints bilateral reassessment: CMS intact, skin w/d/i, +2 pulses, pt offered urinal and water, dignity maintained. VSS 

## 2011-10-23 NOTE — ED Notes (Addendum)
Attempted to give pt ativan. Pt stated that he didn't want it because he was leaving. I informed the pt he was not discharge yet. Attempted therapeutic communication with pt regarding his medical screen. Pt became verbally and physically aggressive towards staff while attempting to leave. After the pt was given time to vent, I explained once more about his plan of care. Pt was still verbally and physically threatening to staff. [employer]pd and security had to assist the pt back to his room. Once in the room pt continue to be verbally aggressive. Dr Adriana Simas notified. Pt given 20mg  of geodon and placed in restraints. While placing pt in restraints pt still was verbally and physically threatening to gpd and staff. Pt was given clear instructions and reasons while he was being restrained. Pt is a threat to himself and others. Pt was placed on continuous cardiac/pulse ox monitoring with bp cycling every 15 min.  Pt's dignity and safety maintained. Will continue to monitor

## 2011-10-23 NOTE — ED Provider Notes (Signed)
History     CSN: [id number]  Arrival date & time 10/23/11  1905   First MD Initiated Contact with Patient 10/23/11 1920      Chief Complaint  Patient presents with  . Suicidal  . Alcohol Intoxication    (Consider location/radiation/quality/duration/timing/severity/associated sxs/prior treatment) HPI.... level V caveat for intoxication.  Patient is regular visitor to the emergency department for alcohol abuse.  He has suicidal ideation tonight. Appears intoxicated.  is uncooperative. He appears to be his normal self.  No obvious head trauma History reviewed. No pertinent past medical history.  History reviewed. No pertinent past surgical history.  History reviewed. No pertinent family history.  History  Substance Use Topics  . Smoking status: Current Everyday Smoker  . Smokeless tobacco: Not on file  . Alcohol Use: Yes      Review of Systems  Unable to perform ROS: Other    Allergies  Review of patient's allergies indicates no known allergies.  Home Medications  No current outpatient prescriptions on file.  BP 150/84  Pulse 80  Temp(Src) 97.7 F (36.5 C) (Oral)  Resp 18  SpO2 97%  Physical Exam  Nursing note and vitals reviewed. Constitutional:       Intoxicated and surly  HENT:  Head: Normocephalic and atraumatic.  Eyes: Conjunctivae and EOM are normal. Pupils are equal, round, and reactive to light.  Neck: Normal range of motion. Neck supple.  Cardiovascular: Normal rate and regular rhythm.   Pulmonary/Chest: Effort normal and breath sounds normal.  Abdominal: Soft. Bowel sounds are normal.  Musculoskeletal: Normal range of motion.  Neurological:       Moving all extremities  Skin: Skin is warm and dry.  Psychiatric:       Aggressive, suicidal ideation    ED Course  Procedures (including critical care time)  Labs Reviewed  CBC - Abnormal; Notable for the following:    Platelets 107 (*)    All other components within normal limits    DIFFERENTIAL - Abnormal; Notable for the following:    Neutrophils Relative 37 (*)    Lymphocytes Relative 47 (*)    Basophils Relative 2 (*)    Neutro Abs 1.6 (*)    All other components within normal limits  BASIC METABOLIC PANEL - Abnormal; Notable for the following:    BUN 4 (*)    All other components within normal limits  ETHANOL - Abnormal; Notable for the following:    Alcohol, Ethyl (B) 271 (*)    All other components within normal limits  URINE RAPID DRUG SCREEN (HOSP PERFORMED)   No results found.   No diagnosis found.  CRITICAL CARE Performed by: Donnetta Hutching   Total critical care time: 30  Critical care time was exclusive of separately billable procedures and treating other patients.  Critical care was necessary to treat or prevent imminent or life-threatening deterioration.  Critical care was time spent personally by me on the following activities: development of treatment plan with patient and/or surrogate as well as nursing, discussions with consultants, evaluation of patient's response to treatment, examination of patient, obtaining history from patient or surrogate, ordering and performing treatments and interventions, ordering and review of laboratory studies, ordering and review of radiographic studies, pulse oximetry and re-evaluation of patient's condition.  MDM  Patient required Geodon and restraints to protect himself. IVC papers obtained.  Consult behavioral health        Donnetta Hutching, MD 10/23/11 2139

## 2011-10-23 NOTE — ED Notes (Signed)
Restraints bilateral reassessment: CMS intact, skin w/d/i, +2 pulses, pt offered urinal and water, dignity maintained

## 2011-10-23 NOTE — ED Notes (Signed)
Per [facility]- Pt presents with ETOH and admits to SI.  Pt was kicked out of shelter when [employer] arrived Pt reports SI

## 2011-10-23 NOTE — ED Notes (Signed)
Restraints bilateral reassessment: CMS intact, skin w/d/i, +2 pulses, cap refill <2 sec, pt given urinal and water

## 2011-10-23 NOTE — ED Notes (Signed)
Restraints reassessment: CMS intact, skin w/d/i, cap refill <2 sec

## 2011-10-23 NOTE — ED Notes (Addendum)
Restraints d/c'd. Pt agreed to cooperative behavior. Pt states he just wants to sleep. VSS. Will continue to monitor

## 2011-10-23 NOTE — ED Notes (Signed)
Restraints bilateral reassessment: CMS intact, skin w/d/i, +2 pulses, pt offered urinal and water, dignity maintained. VSS

## 2011-10-24 LAB — RAPID URINE DRUG SCREEN, HOSP PERFORMED
Amphetamines: NOT DETECTED
Barbiturates: NOT DETECTED
Benzodiazepines: NOT DETECTED
Cocaine: NOT DETECTED
Tetrahydrocannabinol: NOT DETECTED

## 2011-10-24 NOTE — ED Provider Notes (Signed)
Patient was reassessed by myself at the beginning of the shift.  He was completely alert and oriented without signs of psychosis.  Patient has capacity and denies suicidal or homicidal ideation.  He is no longer intoxicated.  He denies access to firearms.  He plans to leave and return to his place of residence.  Patient was felt safe for discharge and I rescinded his IVC.  Patient was discharged in good condition.    Cyndra Numbers, MD 10/24/11 [phone]

## 2011-10-24 NOTE — ED Notes (Signed)
Asleep, easily arousable,calm, no signs of acute changes
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
## 2011-10-24 NOTE — BH Assessment (Signed)
Writer approached by Doyne Keel, patients nurse regarding a potential discharge. She sts that patient presented intoxicated last night making suicidal comments. Today Doyne Keel sts that patient is requesting to leave and no longer suicidal. She informs me that Dr. Alto Denver is also willing to let patient go but will need assistance with the IVC reversal. Writer provided Dr. Alto Denver with the appropriate paperwork and patient was discharged.   Dr. Alto Denver noted and informed me that patient was reassessed by her at the beginning of the shift. He was completely alert and oriented without signs of psychosis. Patient has capacity and denies suicidal or homicidal ideation. He is no longer intoxicated. He denies access to firearms. He plans to leave and return to his place of residence. Patient was felt safe for discharge and I rescinded his IVC. Patient was discharged in good condition.  Melynda Ripple, MS (Assessment Counselor)
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
## 2011-11-08 ENCOUNTER — Emergency Department (HOSPITAL_COMMUNITY)
Admission: EM | Admit: 2011-11-08 | Discharge: 2011-11-09 | Disposition: A | Discharge status: Home / Self Care | Payer: Medicaid Other | Attending: Emergency Medicine | Admitting: Emergency Medicine

## 2011-11-08 DIAGNOSIS — F1010 Alcohol abuse, uncomplicated: Secondary | ICD-10-CM | POA: Insufficient documentation

## 2011-11-08 DIAGNOSIS — F10929 Alcohol use, unspecified with intoxication, unspecified: Secondary | ICD-10-CM

## 2011-11-08 DIAGNOSIS — IMO0002 Reserved for concepts with insufficient information to code with codable children: Secondary | ICD-10-CM | POA: Insufficient documentation

## 2011-11-08 DIAGNOSIS — F17200 Nicotine dependence, unspecified, uncomplicated: Secondary | ICD-10-CM | POA: Insufficient documentation

## 2011-11-08 DIAGNOSIS — W19XXXA Unspecified fall, initial encounter: Secondary | ICD-10-CM | POA: Insufficient documentation
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
## 2011-11-08 NOTE — ED Notes (Signed)
[id number]> Expected date:<BR> Expected time:<BR> Means of arrival:<BR> Comments:<BR> EMS/intoxicated
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
## 2011-11-09 ENCOUNTER — Emergency Department (HOSPITAL_COMMUNITY): Payer: Medicaid Other

## 2011-11-09 NOTE — ED Provider Notes (Signed)
History     CSN: [id number]  Arrival date & time 11/08/11  2235   First MD Initiated Contact with Patient 11/08/11 2253      Chief Complaint  Patient presents with  . Alcohol Intoxication    (Consider location/radiation/quality/duration/timing/severity/associated sxs/prior treatment) Patient is a 56 y.o. male presenting with intoxication. The history is provided by the patient.  Alcohol Intoxication This is a recurrent problem. The current episode started less than 1 hour ago. The problem occurs constantly. The problem has not changed since onset.Pertinent negatives include no chest pain, no abdominal pain, no headaches and no shortness of breath. The symptoms are aggravated by nothing. The symptoms are relieved by nothing. He has tried nothing for the symptoms.  BIB EMS. Found intoxicated by [employer] and EMS called. PT arrives admitting to consuming alcohol. He denies any complaints. He does have a forehead abrasion with bleeding controlled, states he fell but unable to provide any specific details regarding what happened. Also, abrasion noted R knee.  He denies any other complaints, ambulates without assistance. He denies any SI/ HI.    No past medical history on file.  No past surgical history on file.  No family history on file.  History  Substance Use Topics  . Smoking status: Current Everyday Smoker  . Smokeless tobacco: Not on file  . Alcohol Use: Yes      Review of Systems  Constitutional: Negative for fever and chills.  HENT: Negative for neck pain and neck stiffness.   Eyes: Negative for pain.  Respiratory: Negative for shortness of breath.   Cardiovascular: Negative for chest pain.  Gastrointestinal: Negative for nausea, vomiting, abdominal pain and diarrhea.  Genitourinary: Negative for dysuria.  Musculoskeletal: Negative for back pain and joint swelling.  Skin: Positive for wound. Negative for rash.  Neurological: Negative for headaches.  All other systems  reviewed and are negative.    Allergies  Review of patient's allergies indicates no known allergies.  Home Medications  No current outpatient prescriptions on file.  BP 164/90  Pulse 87  Temp(Src) 97.6 F (36.4 C) (Oral)  Resp 18  SpO2 100%  Physical Exam  Constitutional: He appears well-developed and well-nourished.  HENT:  Head: Normocephalic.       Abrasion to forehead with bleeding controlled. No bony stepoff or deformity. No epistaxis. No dental tenderness or trismus.   Eyes: Conjunctivae and EOM are normal. Pupils are equal, round, and reactive to light.  Neck: Trachea normal. Neck supple. No thyromegaly present.  Cardiovascular: Normal rate, regular rhythm, S1 normal, S2 normal and normal pulses.     No systolic murmur is present   No diastolic murmur is present  Pulses:      Radial pulses are 2+ on the right side, and 2+ on the left side.  Pulmonary/Chest: Effort normal and breath sounds normal. He has no wheezes. He has no rhonchi. He has no rales. He exhibits no tenderness.  Abdominal: Soft. Normal appearance and bowel sounds are normal. There is no tenderness. There is no CVA tenderness and negative Murphy's sign.  Musculoskeletal:       Abrasion to right knee. No effusion. Distal neurovascular intact. NTTP over knee, hip and ankle. Distal N/V intact. Ambulates without assistance.  Neurological: He is alert. He has normal strength. No cranial nerve deficit or sensory deficit. GCS eye subscore is 4. GCS verbal subscore is 5. GCS motor subscore is 6.       No focal deficits  Skin: Skin is warm  and dry. No rash noted. He is not diaphoretic.  Psychiatric: His speech is normal.    ED Course  Procedures (including critical care time)  Labs Reviewed - No data to display Ct Head Wo Contrast  11/09/2011  *RADIOLOGY REPORT*  Clinical Data:  Status post fall; laceration to the forehead. Concern for cervical spine injury.  CT HEAD WITHOUT CONTRAST AND CT CERVICAL SPINE  WITHOUT CONTRAST  Technique:  Multidetector CT imaging of the head and cervical spine was performed following the standard protocol without intravenous contrast.  Multiplanar CT image reconstructions of the cervical spine were also generated.  Comparison: CT of the head performed 04/10/2008  CT HEAD  Findings: There is no evidence of acute infarction, mass lesion, or intra- or extra-axial hemorrhage on CT.  Scattered periventricular and subcortical white matter change likely reflects small vessel ischemic microangiopathy.  The posterior fossa, including the cerebellum, brainstem and fourth ventricle, is within normal limits.  The third and lateral ventricles, and basal ganglia are unremarkable in appearance.  The cerebral hemispheres demonstrate grossly normal gray-white differentiation.  No mass effect or midline shift is seen.  There is no evidence of fracture; visualized osseous structures are unremarkable in appearance.  The orbits are within normal limits. The paranasal sinuses and mastoid air cells are well-aerated.  The known soft tissue laceration along the forehead is not well characterized.  IMPRESSION:  1.  No evidence of traumatic intracranial injury or fracture. 2.  Scattered small vessel ischemic microangiopathy.  CT CERVICAL SPINE  Findings: There is no evidence of fracture or subluxation. Vertebral bodies demonstrate normal height and alignment. Intervertebral disc spaces are preserved. Small anterior and posterior disc osteophyte complexes are noted at C3-C4. Prevertebral soft tissues are within normal limits.  The thyroid gland is unremarkable in appearance.  The visualized lung apices are clear.  No significant soft tissue abnormalities are seen.  Mild scattered calcification is noted at the carotid bifurcations bilaterally.  IMPRESSION:  1.  No evidence of fracture or subluxation along the cervical spine. 2.  Mild scattered calcification at the carotid bifurcations bilaterally.  Original Report  Authenticated By: Tonia Ghent, M.D.   Ct Cervical Spine Wo Contrast  11/09/2011  *RADIOLOGY REPORT*  Clinical Data:  Status post fall; laceration to the forehead. Concern for cervical spine injury.  CT HEAD WITHOUT CONTRAST AND CT CERVICAL SPINE WITHOUT CONTRAST  Technique:  Multidetector CT imaging of the head and cervical spine was performed following the standard protocol without intravenous contrast.  Multiplanar CT image reconstructions of the cervical spine were also generated.  Comparison: CT of the head performed 04/10/2008  CT HEAD  Findings: There is no evidence of acute infarction, mass lesion, or intra- or extra-axial hemorrhage on CT.  Scattered periventricular and subcortical white matter change likely reflects small vessel ischemic microangiopathy.  The posterior fossa, including the cerebellum, brainstem and fourth ventricle, is within normal limits.  The third and lateral ventricles, and basal ganglia are unremarkable in appearance.  The cerebral hemispheres demonstrate grossly normal gray-white differentiation.  No mass effect or midline shift is seen.  There is no evidence of fracture; visualized osseous structures are unremarkable in appearance.  The orbits are within normal limits. The paranasal sinuses and mastoid air cells are well-aerated.  The known soft tissue laceration along the forehead is not well characterized.  IMPRESSION:  1.  No evidence of traumatic intracranial injury or fracture. 2.  Scattered small vessel ischemic microangiopathy.  CT CERVICAL SPINE  Findings: There is  no evidence of fracture or subluxation. Vertebral bodies demonstrate normal height and alignment. Intervertebral disc spaces are preserved. Small anterior and posterior disc osteophyte complexes are noted at C3-C4. Prevertebral soft tissues are within normal limits.  The thyroid gland is unremarkable in appearance.  The visualized lung apices are clear.  No significant soft tissue abnormalities are seen.   Mild scattered calcification is noted at the carotid bifurcations bilaterally.  IMPRESSION:  1.  No evidence of fracture or subluxation along the cervical spine. 2.  Mild scattered calcification at the carotid bifurcations bilaterally.  Original Report Authenticated By: Tonia Ghent, M.D.       MDM  Alcohol intoxication. Patient observed in emergency department. CT scan obtained and reviewed as above. Patient is appropriate for discharge with police.        Sunnie Nielsen, MD 11/09/11 [phone]

## 2011-11-09 NOTE — ED Notes (Signed)
Pt in paper scrubs and given warm blanket.

## 2011-11-09 NOTE — ED Notes (Signed)
Pt discharged in the custody of [employer].
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
## 2012-02-04 ENCOUNTER — Emergency Department (HOSPITAL_COMMUNITY): Payer: Medicaid Other

## 2012-02-04 ENCOUNTER — Emergency Department (HOSPITAL_COMMUNITY)
Admission: EM | Admit: 2012-02-04 | Discharge: 2012-02-04 | Disposition: A | Discharge status: Home / Self Care | Payer: Medicaid Other | Attending: Emergency Medicine | Admitting: Emergency Medicine

## 2012-02-04 ENCOUNTER — Encounter (HOSPITAL_COMMUNITY): Payer: Self-pay | Admitting: *Deleted

## 2012-02-04 DIAGNOSIS — IMO0002 Reserved for concepts with insufficient information to code with codable children: Secondary | ICD-10-CM | POA: Insufficient documentation

## 2012-02-04 DIAGNOSIS — S8392XA Sprain of unspecified site of left knee, initial encounter: Secondary | ICD-10-CM

## 2012-02-04 DIAGNOSIS — M25569 Pain in unspecified knee: Secondary | ICD-10-CM | POA: Insufficient documentation

## 2012-02-04 DIAGNOSIS — R109 Unspecified abdominal pain: Secondary | ICD-10-CM | POA: Insufficient documentation

## 2012-02-04 DIAGNOSIS — Y92410 Unspecified street and highway as the place of occurrence of the external cause: Secondary | ICD-10-CM | POA: Insufficient documentation

## 2012-02-04 DIAGNOSIS — R079 Chest pain, unspecified: Secondary | ICD-10-CM | POA: Insufficient documentation

## 2012-02-04 DIAGNOSIS — S20219A Contusion of unspecified front wall of thorax, initial encounter: Secondary | ICD-10-CM

## 2012-02-04 HISTORY — DX: Unspecified viral hepatitis C without hepatic coma: B19.20

## 2012-02-04 HISTORY — DX: Acute pancreatitis without necrosis or infection, unspecified: K85.90

## 2012-02-04 HISTORY — DX: Unspecified cirrhosis of liver: K74.60

## 2012-02-04 LAB — URINALYSIS, ROUTINE W REFLEX MICROSCOPIC
Bilirubin Urine: NEGATIVE
Glucose, UA: NEGATIVE mg/dL
Hgb urine dipstick: NEGATIVE
Ketones, ur: NEGATIVE mg/dL
Leukocytes, UA: NEGATIVE
Nitrite: NEGATIVE
Protein, ur: NEGATIVE mg/dL
Specific Gravity, Urine: 1.02 (ref 1.005–1.030)
Urobilinogen, UA: 2 mg/dL — ABNORMAL HIGH (ref 0.0–1.0)
pH: 6.5 (ref 5.0–8.0)
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
## 2012-02-04 MED ORDER — HYDROCODONE-ACETAMINOPHEN 5-325 MG PO TABS
1.0000 | ORAL_TABLET | Freq: Once | ORAL | Status: AC
  Administered 2012-02-04: 1 via ORAL
  Filled 2012-02-04: qty 1

## 2012-02-04 MED ORDER — HYDROCODONE-ACETAMINOPHEN 5-325 MG PO TABS
2.0000 | ORAL_TABLET | Freq: Once | ORAL | Status: AC
  Administered 2012-02-04: 2 via ORAL
  Filled 2012-02-04: qty 2

## 2012-02-04 MED ORDER — HYDROCODONE-ACETAMINOPHEN 5-325 MG PO TABS: ORAL_TABLET | ORAL | Status: AC

## 2012-02-04 NOTE — ED Notes (Signed)
Patient is alert and oriented x3.  He was given DC instructions and follow up visit instructions.  Patient gave verbal understanding.  He was DC ambulatory under his own power.  V/S stable.  He was not showing any signs of distress on DC 

## 2012-02-04 NOTE — Discharge Instructions (Signed)
Chest Contusion You have been checked for injuries to your chest. Your caregiver has not found injuries serious enough to require hospitalization. It is common to have bruises and sore muscles after an injury. These tend to feel worse the first 24 hours. You may gradually develop more stiffness and soreness over the next several hours to several days. This usually feels worse the first morning following your injury. After a few days, you will usually begin to improve. The amount of improvement depends on the amount of damage. Following the accident, if the pain in any area continues to increase or you develop new areas of pain, you should see your primary caregiver or return to the Emergency Department for re-evaluation. HOME CARE INSTRUCTIONS   Put ice on sore areas every 2 hours for 20 minutes while awake for the next 2 days.   Drink extra fluids. Do not drink alcohol.   Activity as tolerated. Lifting may make pain worse.   Only take over-the-counter or prescription medicines for pain, discomfort, or fever as directed by your caregiver. Do not use aspirin. This may increase bruising or increase bleeding.  SEEK IMMEDIATE MEDICAL CARE IF:   There is a worsening of any of the problems that brought you in for care.   Shortness of breath, dizziness or fainting develop.   You have chest pain, difficulty breathing, or develop pain going down the left arm or up into jaw.   You feel sick to your stomach (nausea), vomiting or sweats.   You have increasing belly (abdominal) discomfort.   There is blood in your urine, stool, or if you vomit blood.   There is pain in either shoulder in an area where a shoulder strap would be.   You have feelings of lightheadedness, or if you should have a fainting episode.   You have numbness, tingling, weakness, or problems with the use of your arms or legs.   Severe headaches not relieved with medications develop.   You have a change in bowel or bladder  control.   There is increasing pain in any areas of the body.  If you feel your symptoms are worsening, and you are not able to see your primary caregiver, return to the Emergency Department immediately. MAKE SURE YOU:   Understand these instructions.   Will watch your condition.   Will get help right away if you are not doing well or get worse.  Document Released: 06/28/2001 Document Revised: 09/22/2011 Document Reviewed: 05/21/2008 [facility] Patient Information 2012 [place], [name].    Joint Sprain A sprain is a tear or stretch in the ligaments that hold a joint together. Severe sprains may need as long as 3-6 weeks of immobilization and/or exercises to heal completely. Sprained joints should be rested and protected. If not, they can become unstable and prone to re-injury. Proper treatment can reduce your pain, shorten the period of disability, and reduce the risk of repeated injuries. TREATMENT   Rest and elevate the injured joint to reduce pain and swelling.   Apply ice packs to the injury for 20-30 minutes every 2-3 hours for the next 2-3 days.   Keep the injury wrapped in a compression bandage or splint as long as the joint is painful or as instructed by your caregiver.   Do not use the injured joint until it is completely healed to prevent re-injury and chronic instability. Follow the instructions of your caregiver.   Long-term sprain management may require exercises and/or treatment by a physical therapist. Taping  or special braces may help stabilize the joint until it is completely better.  SEEK MEDICAL CARE IF:   You develop increased pain or swelling of the joint.   You develop increasing redness and warmth of the joint.   You develop a fever.   It becomes stiff.   Your hand or foot gets cold or numb.  Document Released: 11/10/2004 Document Revised: 09/22/2011 Document Reviewed: 10/20/2008 [facility] Patient Information 2012 [place], [name].    Narcotic and  benzodiazepine use may cause drowsiness, slowed breathing or dependence.  Please use with caution and do not drive, operate machinery or watch young children alone while taking them.  Taking combinations of these medications or drinking alcohol will potentiate these effects.
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
## 2012-02-04 NOTE — ED Notes (Signed)
Pt brought in via ems and taken to room 7 and made comfortable.

## 2012-02-08 ENCOUNTER — Inpatient Hospital Stay (HOSPITAL_COMMUNITY): Payer: Medicaid Other

## 2012-02-08 ENCOUNTER — Inpatient Hospital Stay (HOSPITAL_COMMUNITY)
Admission: EM | Admit: 2012-02-08 | Discharge: 2012-02-17 | DRG: 356 | Disposition: A | Discharge status: Home / Self Care | Payer: Medicaid Other | Attending: General Surgery | Admitting: General Surgery

## 2012-02-08 ENCOUNTER — Encounter (HOSPITAL_COMMUNITY): Payer: Self-pay | Admitting: Certified Registered"

## 2012-02-08 ENCOUNTER — Emergency Department (HOSPITAL_COMMUNITY): Payer: Medicaid Other

## 2012-02-08 ENCOUNTER — Emergency Department (HOSPITAL_COMMUNITY): Payer: Medicaid Other | Admitting: Certified Registered"

## 2012-02-08 ENCOUNTER — Encounter (HOSPITAL_COMMUNITY): Admission: EM | Disposition: A | Discharge status: Home / Self Care | Payer: Self-pay | Source: Home / Self Care

## 2012-02-08 DIAGNOSIS — I959 Hypotension, unspecified: Secondary | ICD-10-CM | POA: Diagnosis not present

## 2012-02-08 DIAGNOSIS — Z5900 Homelessness unspecified: Secondary | ICD-10-CM

## 2012-02-08 DIAGNOSIS — F10229 Alcohol dependence with intoxication, unspecified: Secondary | ICD-10-CM | POA: Diagnosis present

## 2012-02-08 DIAGNOSIS — K7460 Unspecified cirrhosis of liver: Secondary | ICD-10-CM | POA: Diagnosis present

## 2012-02-08 DIAGNOSIS — X789XXA Intentional self-harm by unspecified sharp object, initial encounter: Secondary | ICD-10-CM | POA: Diagnosis present

## 2012-02-08 DIAGNOSIS — F17200 Nicotine dependence, unspecified, uncomplicated: Secondary | ICD-10-CM | POA: Diagnosis present

## 2012-02-08 DIAGNOSIS — I10 Essential (primary) hypertension: Secondary | ICD-10-CM | POA: Diagnosis present

## 2012-02-08 DIAGNOSIS — Y92009 Unspecified place in unspecified non-institutional (private) residence as the place of occurrence of the external cause: Secondary | ICD-10-CM

## 2012-02-08 DIAGNOSIS — F601 Schizoid personality disorder: Secondary | ICD-10-CM | POA: Diagnosis present

## 2012-02-08 DIAGNOSIS — S31119A Laceration without foreign body of abdominal wall, unspecified quadrant without penetration into peritoneal cavity, initial encounter: Secondary | ICD-10-CM

## 2012-02-08 DIAGNOSIS — K439 Ventral hernia without obstruction or gangrene: Secondary | ICD-10-CM

## 2012-02-08 DIAGNOSIS — X781XXA Intentional self-harm by knife, initial encounter: Secondary | ICD-10-CM | POA: Diagnosis present

## 2012-02-08 DIAGNOSIS — D696 Thrombocytopenia, unspecified: Secondary | ICD-10-CM | POA: Diagnosis present

## 2012-02-08 DIAGNOSIS — E871 Hypo-osmolality and hyponatremia: Secondary | ICD-10-CM | POA: Diagnosis not present

## 2012-02-08 DIAGNOSIS — K208 Other esophagitis without bleeding: Secondary | ICD-10-CM | POA: Diagnosis present

## 2012-02-08 DIAGNOSIS — F1020 Alcohol dependence, uncomplicated: Secondary | ICD-10-CM | POA: Diagnosis present

## 2012-02-08 DIAGNOSIS — F10239 Alcohol dependence with withdrawal, unspecified: Secondary | ICD-10-CM | POA: Diagnosis not present

## 2012-02-08 DIAGNOSIS — F329 Major depressive disorder, single episode, unspecified: Secondary | ICD-10-CM

## 2012-02-08 DIAGNOSIS — K458 Other specified abdominal hernia without obstruction or gangrene: Secondary | ICD-10-CM | POA: Diagnosis present

## 2012-02-08 DIAGNOSIS — K226 Gastro-esophageal laceration-hemorrhage syndrome: Secondary | ICD-10-CM | POA: Diagnosis present

## 2012-02-08 DIAGNOSIS — F32A Depression, unspecified: Secondary | ICD-10-CM | POA: Diagnosis present

## 2012-02-08 DIAGNOSIS — D62 Acute posthemorrhagic anemia: Secondary | ICD-10-CM | POA: Diagnosis not present

## 2012-02-08 DIAGNOSIS — E876 Hypokalemia: Secondary | ICD-10-CM | POA: Diagnosis not present

## 2012-02-08 DIAGNOSIS — B1920 Unspecified viral hepatitis C without hepatic coma: Secondary | ICD-10-CM | POA: Diagnosis present

## 2012-02-08 DIAGNOSIS — R066 Hiccough: Secondary | ICD-10-CM | POA: Diagnosis present

## 2012-02-08 DIAGNOSIS — F10939 Alcohol use, unspecified with withdrawal, unspecified: Secondary | ICD-10-CM | POA: Diagnosis not present

## 2012-02-08 DIAGNOSIS — F322 Major depressive disorder, single episode, severe without psychotic features: Secondary | ICD-10-CM | POA: Diagnosis present

## 2012-02-08 DIAGNOSIS — L259 Unspecified contact dermatitis, unspecified cause: Secondary | ICD-10-CM | POA: Diagnosis present

## 2012-02-08 DIAGNOSIS — Z8673 Personal history of transient ischemic attack (TIA), and cerebral infarction without residual deficits: Secondary | ICD-10-CM

## 2012-02-08 DIAGNOSIS — S3681XA Injury of peritoneum, initial encounter: Principal | ICD-10-CM | POA: Diagnosis present

## 2012-02-08 HISTORY — DX: Depression, unspecified: F32.A

## 2012-02-08 HISTORY — DX: Chronic viral hepatitis C: B18.2

## 2012-02-08 HISTORY — DX: Essential (primary) hypertension: I10

## 2012-02-08 HISTORY — DX: Major depressive disorder, single episode, unspecified: F32.9

## 2012-02-08 HISTORY — PX: LAPAROTOMY: SHX154

## 2012-02-08 LAB — PROTIME-INR
INR: 1.19 (ref 0.00–1.49)
Prothrombin Time: 15.4 seconds — ABNORMAL HIGH (ref 11.6–15.2)

## 2012-02-08 LAB — POCT I-STAT 7, (LYTES, BLD GAS, ICA,H+H)
O2 Saturation: 100 %
Patient temperature: 36.2
Potassium: 3.6 mEq/L (ref 3.5–5.1)
Sodium: 146 mEq/L — ABNORMAL HIGH (ref 135–145)
TCO2: 25 mmol/L (ref 0–100)
pCO2 arterial: 41.4 mmHg (ref 35.0–45.0)

## 2012-02-08 LAB — CBC
HCT: 38.4 % — ABNORMAL LOW (ref 39.0–52.0)
Hemoglobin: 13.2 g/dL (ref 13.0–17.0)
Hemoglobin: 11.4 g/dL — ABNORMAL LOW (ref 13.0–17.0)
MCH: 30.2 pg (ref 26.0–34.0)
RBC: 4.32 MIL/uL (ref 4.22–5.81)
RBC: 3.78 MIL/uL — ABNORMAL LOW (ref 4.22–5.81)

## 2012-02-08 LAB — POCT I-STAT, CHEM 8
Calcium, Ion: 1.02 mmol/L — ABNORMAL LOW (ref 1.12–1.32)
Chloride: 108 mEq/L (ref 96–112)
Glucose, Bld: 99 mg/dL (ref 70–99)
HCT: 42 % (ref 39.0–52.0)
Hemoglobin: 14.3 g/dL (ref 13.0–17.0)
TCO2: 24 mmol/L (ref 0–100)

## 2012-02-08 LAB — RAPID URINE DRUG SCREEN, HOSP PERFORMED
Opiates: POSITIVE — AB
Tetrahydrocannabinol: NOT DETECTED

## 2012-02-08 LAB — COMPREHENSIVE METABOLIC PANEL
ALT: 164 U/L — ABNORMAL HIGH (ref 0–53)
ALT: 140 U/L — ABNORMAL HIGH (ref 0–53)
Alkaline Phosphatase: 62 U/L (ref 39–117)
CO2: 23 mEq/L (ref 19–32)
CO2: 24 mEq/L (ref 19–32)
Calcium: 8.2 mg/dL — ABNORMAL LOW (ref 8.4–10.5)
Calcium: 7.6 mg/dL — ABNORMAL LOW (ref 8.4–10.5)
Creatinine, Ser: 0.49 mg/dL — ABNORMAL LOW (ref 0.50–1.35)
GFR calc Af Amer: >90 mL/min (ref >90)
GFR calc Af Amer: >90 mL/min (ref >90)
GFR calc non Af Amer: >90 mL/min (ref >90)
GFR calc non Af Amer: >90 mL/min (ref >90)
Glucose, Bld: 99 mg/dL (ref 70–99)
Glucose, Bld: 102 mg/dL — ABNORMAL HIGH (ref 70–99)
Sodium: 141 mEq/L (ref 135–145)
Sodium: 142 mEq/L (ref 135–145)
Total Bilirubin: 1 mg/dL (ref 0.3–1.2)
Total Protein: 7.4 g/dL (ref 6.0–8.3)

## 2012-02-08 LAB — URINALYSIS, MICROSCOPIC ONLY
Glucose, UA: NEGATIVE mg/dL
Hgb urine dipstick: NEGATIVE
Leukocytes, UA: NEGATIVE
Specific Gravity, Urine: 1.019 (ref 1.005–1.030)
pH: 5 (ref 5.0–8.0)

## 2012-02-08 LAB — LACTIC ACID, PLASMA: Lactic Acid, Venous: 1.4 mmol/L (ref 0.5–2.2)

## 2012-02-08 LAB — CDS SEROLOGY

## 2012-02-08 LAB — ETHANOL: Alcohol, Ethyl (B): 279 mg/dL — ABNORMAL HIGH (ref 0–11)

## 2012-02-08 SURGERY — LAPAROTOMY, EXPLORATORY
Anesthesia: General | Site: Abdomen | Wound class: Clean Contaminated

## 2012-02-08 MED ORDER — PANTOPRAZOLE SODIUM 40 MG IV SOLR
40.0000 mg | Freq: Every day | INTRAVENOUS | Status: DC
  Administered 2012-02-08: 40 mg via INTRAVENOUS
  Filled 2012-02-08 (×2): qty 40

## 2012-02-08 MED ORDER — CEFAZOLIN SODIUM 1-5 GM-% IV SOLN
1.0000 g | Freq: Three times a day (TID) | INTRAVENOUS | Status: DC
  Administered 2012-02-09: 1 g via INTRAVENOUS
  Filled 2012-02-08 (×3): qty 50

## 2012-02-08 MED ORDER — HYDROMORPHONE HCL PF 1 MG/ML IJ SOLN
INTRAMUSCULAR | Status: AC
  Filled 2012-02-08: qty 1

## 2012-02-08 MED ORDER — THIAMINE HCL 100 MG/ML IJ SOLN
100.0000 mg | Freq: Every day | INTRAMUSCULAR | Status: DC
  Administered 2012-02-08: 100 mg via INTRAVENOUS
  Filled 2012-02-08 (×2): qty 1

## 2012-02-08 MED ORDER — ONDANSETRON HCL 4 MG/2ML IJ SOLN: 4.0000 mg | Freq: Once | INTRAMUSCULAR | Status: DC | PRN

## 2012-02-08 MED ORDER — PROPOFOL 10 MG/ML IV EMUL
INTRAVENOUS | Status: DC | PRN
  Administered 2012-02-08: 100 mg via INTRAVENOUS

## 2012-02-08 MED ORDER — OXYCODONE HCL 5 MG PO TABS
5.0000 mg | ORAL_TABLET | ORAL | Status: DC | PRN
  Administered 2012-02-09 – 2012-02-13 (×8): 5 mg via ORAL
  Filled 2012-02-08 (×2): qty 1
  Filled 2012-02-08: qty 2
  Filled 2012-02-08 (×6): qty 1

## 2012-02-08 MED ORDER — CEFAZOLIN SODIUM 1-5 GM-% IV SOLN
INTRAVENOUS | Status: DC | PRN
  Administered 2012-02-08: 2 g via INTRAVENOUS

## 2012-02-08 MED ORDER — SODIUM CHLORIDE 0.9 % IJ SOLN: 9.0000 mL | INTRAMUSCULAR | Status: DC | PRN

## 2012-02-08 MED ORDER — ONDANSETRON HCL 4 MG/2ML IJ SOLN
4.0000 mg | Freq: Four times a day (QID) | INTRAMUSCULAR | Status: DC | PRN
  Filled 2012-02-08 (×2): qty 2

## 2012-02-08 MED ORDER — SUCCINYLCHOLINE CHLORIDE 20 MG/ML IJ SOLN
INTRAMUSCULAR | Status: DC | PRN
  Administered 2012-02-08: 120 mg via INTRAVENOUS

## 2012-02-08 MED ORDER — OXYCODONE HCL 5 MG PO TABS: 2.5000 mg | ORAL_TABLET | ORAL | Status: DC | PRN

## 2012-02-08 MED ORDER — FENTANYL CITRATE 0.05 MG/ML IJ SOLN
INTRAMUSCULAR | Status: DC | PRN
  Administered 2012-02-08 (×3): 50 ug via INTRAVENOUS
  Administered 2012-02-08: 100 ug via INTRAVENOUS

## 2012-02-08 MED ORDER — PANTOPRAZOLE SODIUM 40 MG PO TBEC
40.0000 mg | DELAYED_RELEASE_TABLET | Freq: Every day | ORAL | Status: DC
  Administered 2012-02-09: 40 mg via ORAL
  Filled 2012-02-08: qty 1

## 2012-02-08 MED ORDER — ONDANSETRON HCL 4 MG/2ML IJ SOLN: 4.0000 mg | Freq: Four times a day (QID) | INTRAMUSCULAR | Status: DC | PRN

## 2012-02-08 MED ORDER — ROCURONIUM BROMIDE 100 MG/10ML IV SOLN
INTRAVENOUS | Status: DC | PRN
  Administered 2012-02-08: 20 mg via INTRAVENOUS
  Administered 2012-02-08: 50 mg via INTRAVENOUS

## 2012-02-08 MED ORDER — LACTATED RINGERS IV SOLN
INTRAVENOUS | Status: DC | PRN
  Administered 2012-02-08: 18:00:00 via INTRAVENOUS

## 2012-02-08 MED ORDER — ONDANSETRON HCL 4 MG PO TABS: 4.0000 mg | ORAL_TABLET | Freq: Four times a day (QID) | ORAL | Status: DC | PRN

## 2012-02-08 MED ORDER — DIPHENHYDRAMINE HCL 50 MG/ML IJ SOLN: 12.5000 mg | Freq: Four times a day (QID) | INTRAMUSCULAR | Status: DC | PRN

## 2012-02-08 MED ORDER — NEOSTIGMINE METHYLSULFATE 1 MG/ML IJ SOLN
INTRAMUSCULAR | Status: DC | PRN
  Administered 2012-02-08: 5 mg via INTRAVENOUS

## 2012-02-08 MED ORDER — DIPHENHYDRAMINE HCL 12.5 MG/5ML PO ELIX
12.5000 mg | ORAL_SOLUTION | Freq: Four times a day (QID) | ORAL | Status: DC | PRN
  Filled 2012-02-08: qty 5

## 2012-02-08 MED ORDER — GLYCOPYRROLATE 0.2 MG/ML IJ SOLN
INTRAMUSCULAR | Status: DC | PRN
  Administered 2012-02-08: .8 mg via INTRAVENOUS

## 2012-02-08 MED ORDER — HYDROMORPHONE HCL PF 1 MG/ML IJ SOLN
0.2500 mg | INTRAMUSCULAR | Status: DC | PRN
  Administered 2012-02-08 (×5): 0.5 mg via INTRAVENOUS

## 2012-02-08 MED ORDER — HYDROMORPHONE 0.3 MG/ML IV SOLN: INTRAVENOUS | Status: DC

## 2012-02-08 MED ORDER — SODIUM CHLORIDE 0.9 % IV SOLN
1.0000 mg | Freq: Once | INTRAVENOUS | Status: AC
  Administered 2012-02-08: 1 mg via INTRAVENOUS
  Filled 2012-02-08: qty 0.2

## 2012-02-08 MED ORDER — HYDROMORPHONE HCL PF 1 MG/ML IJ SOLN
1.0000 mg | INTRAMUSCULAR | Status: DC | PRN
  Administered 2012-02-08: 0.5 mg via INTRAVENOUS
  Administered 2012-02-09: 1 mg via INTRAVENOUS
  Administered 2012-02-09: 0.5 mg via INTRAVENOUS
  Administered 2012-02-09 (×4): 1 mg via INTRAVENOUS
  Filled 2012-02-08 (×6): qty 1

## 2012-02-08 MED ORDER — HETASTARCH-ELECTROLYTES 6 % IV SOLN
INTRAVENOUS | Status: DC | PRN
  Administered 2012-02-08: 19:00:00 via INTRAVENOUS

## 2012-02-08 MED ORDER — DEXTROSE IN LACTATED RINGERS 5 % IV SOLN
INTRAVENOUS | Status: DC
  Administered 2012-02-08 – 2012-02-09 (×2): via INTRAVENOUS

## 2012-02-08 MED ORDER — CEFAZOLIN SODIUM 1-5 GM-% IV SOLN
INTRAVENOUS | Status: AC
  Filled 2012-02-08: qty 100

## 2012-02-08 MED ORDER — NALOXONE HCL 0.4 MG/ML IJ SOLN: 0.4000 mg | INTRAMUSCULAR | Status: DC | PRN

## 2012-02-08 MED ORDER — MIDAZOLAM HCL 5 MG/5ML IJ SOLN
INTRAMUSCULAR | Status: DC | PRN
  Administered 2012-02-08: 2 mg via INTRAVENOUS

## 2012-02-08 MED ORDER — SPIRITUS FRUMENTI
1.0000 | Freq: Three times a day (TID) | ORAL | Status: DC | PRN
  Administered 2012-02-08 – 2012-02-09 (×2): 1 via ORAL
  Filled 2012-02-08 (×2): qty 1

## 2012-02-08 MED ORDER — 0.9 % SODIUM CHLORIDE (POUR BTL) OPTIME
TOPICAL | Status: DC | PRN
  Administered 2012-02-08: 2000 mL

## 2012-02-08 MED ORDER — LACTATED RINGERS IV SOLN
INTRAVENOUS | Status: DC | PRN
  Administered 2012-02-08 (×2): via INTRAVENOUS

## 2012-02-08 SURGICAL SUPPLY — 60 items
BLADE SURG ROTATE 9660 (MISCELLANEOUS) ×1 IMPLANT
BRR ADH 5X3 SEPRAFILM 6 SHT (MISCELLANEOUS) ×1
CANISTER SUCTION 2500CC (MISCELLANEOUS) ×2 IMPLANT
CHLORAPREP W/TINT 26ML (MISCELLANEOUS) ×2 IMPLANT
CLOTH BEACON ORANGE TIMEOUT ST (SAFETY) ×2 IMPLANT
COVER SURGICAL LIGHT HANDLE (MISCELLANEOUS) ×2 IMPLANT
DRAPE LAPAROSCOPIC ABDOMINAL (DRAPES) ×3 IMPLANT
DRAPE UTILITY 15X26 W/TAPE STR (DRAPE) ×4 IMPLANT
DRAPE WARM FLUID 44X44 (DRAPE) ×3 IMPLANT
DRSG COVADERM 4X10 (GAUZE/BANDAGES/DRESSINGS) IMPLANT
DRSG COVADERM 4X14 (GAUZE/BANDAGES/DRESSINGS) IMPLANT
DRSG MEPILEX BORDER 4X8 (GAUZE/BANDAGES/DRESSINGS) ×1 IMPLANT
ELECT BLADE 4.0 EZ CLEAN MEGAD (MISCELLANEOUS) ×2
ELECT BLADE 6.5 EXT (BLADE) IMPLANT
ELECT CAUTERY BLADE 6.4 (BLADE) ×1 IMPLANT
ELECT REM PT RETURN 9FT ADLT (ELECTROSURGICAL) ×2
ELECTRODE BLDE 4.0 EZ CLN MEGD (MISCELLANEOUS) IMPLANT
ELECTRODE REM PT RTRN 9FT ADLT (ELECTROSURGICAL) ×1 IMPLANT
GLOVE BIO SURGEON STRL SZ 6 (GLOVE) ×3 IMPLANT
GLOVE BIO SURGEON STRL SZ 6.5 (GLOVE) ×1 IMPLANT
GLOVE BIOGEL PI IND STRL 6.5 (GLOVE) ×1 IMPLANT
GLOVE BIOGEL PI IND STRL 7.0 (GLOVE) IMPLANT
GLOVE BIOGEL PI INDICATOR 6.5 (GLOVE) ×3
GLOVE BIOGEL PI INDICATOR 7.0 (GLOVE) ×1
GLOVE EUDERMIC 7 POWDERFREE (GLOVE) ×1 IMPLANT
GOWN BRE IMP PREV XXLGXLNG (GOWN DISPOSABLE) ×1 IMPLANT
GOWN PREVENTION PLUS XXLARGE (GOWN DISPOSABLE) ×4 IMPLANT
GOWN STRL NON-REIN LRG LVL3 (GOWN DISPOSABLE) ×5 IMPLANT
KIT BASIN OR (CUSTOM PROCEDURE TRAY) ×2 IMPLANT
KIT ROOM TURNOVER OR (KITS) ×2 IMPLANT
LIGASURE IMPACT 36 18CM CVD LR (INSTRUMENTS) IMPLANT
NS IRRIG 1000ML POUR BTL (IV SOLUTION) ×2 IMPLANT
PACK GENERAL/GYN (CUSTOM PROCEDURE TRAY) ×2 IMPLANT
PAD ARMBOARD 7.5X6 YLW CONV (MISCELLANEOUS) ×4 IMPLANT
SEALER TISSUE G2 CVD JAW 35 (ENDOMECHANICALS) IMPLANT
SEALER TISSUE G2 CVD JAW 45CM (ENDOMECHANICALS) ×1
SEPRAFILM PROCEDURAL PACK 3X5 (MISCELLANEOUS) ×1 IMPLANT
SPECIMEN JAR X LARGE (MISCELLANEOUS) IMPLANT
SPONGE GAUZE 4X4 12PLY (GAUZE/BANDAGES/DRESSINGS) ×2 IMPLANT
SPONGE LAP 18X18 X RAY DECT (DISPOSABLE) ×2 IMPLANT
STAPLER VISISTAT 35W (STAPLE) ×2 IMPLANT
SUCTION POOLE TIP (SUCTIONS) ×1 IMPLANT
SUT NOVA 1 T20/GS 25DT (SUTURE) ×1 IMPLANT
SUT PDS II 0 TP-1 LOOPED 60 (SUTURE) ×4 IMPLANT
SUT PROLENE 1 CT (SUTURE) ×4 IMPLANT
SUT SILK 2 0 (SUTURE) ×2
SUT SILK 2 0SH CR/8 30 (SUTURE) ×1 IMPLANT
SUT SILK 2-0 18XBRD TIE 12 (SUTURE) IMPLANT
SUT SILK 3 0 (SUTURE) ×2
SUT SILK 3-0 18XBRD TIE 12 (SUTURE) IMPLANT
SUT VIC AB 2-0 SH 18 (SUTURE) ×2 IMPLANT
SUT VIC AB 3-0 SH 18 (SUTURE) ×2 IMPLANT
SUT VICRYL 4-0 PS2 18IN ABS (SUTURE) IMPLANT
SUT VICRYL AB 2 0 TIES (SUTURE) ×2 IMPLANT
SUT VICRYL AB 3 0 TIES (SUTURE) ×2 IMPLANT
TOWEL OR 17X24 6PK STRL BLUE (TOWEL DISPOSABLE) ×3 IMPLANT
TOWEL OR 17X26 10 PK STRL BLUE (TOWEL DISPOSABLE) ×3 IMPLANT
TRAY FOLEY CATH 14FRSI W/METER (CATHETERS) IMPLANT
WATER STERILE IRR 1000ML POUR (IV SOLUTION) ×2 IMPLANT
YANKAUER SUCT BULB TIP NO VENT (SUCTIONS) ×1 IMPLANT
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
## 2012-02-08 NOTE — Anesthesia Postprocedure Evaluation (Signed)
  Anesthesia Post-op Note  Patient: [name]  Procedure(s) Performed: Procedure(s) (LRB): EXPLORATORY LAPAROTOMY (N/A)  Patient Location: PACU  Anesthesia Type: General  Level of Consciousness: awake, oriented and patient cooperative  Airway and Oxygen Therapy: Patient Spontanous Breathing and Patient connected to nasal cannula oxygen  Post-op Pain: mild  Post-op Assessment: Post-op Vital signs reviewed, Patient's Cardiovascular Status Stable, Respiratory Function Stable, Patent Airway, No signs of Nausea or vomiting and Pain level controlled  Post-op Vital Signs: stable  Complications: No apparent anesthesia complications
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
## 2012-02-08 NOTE — Progress Notes (Signed)
I notified House supervisor of the prior events related to this patient and my conversation over the phone with Dr. Donell Beers.  Victorino Dike stated that she would call Dr. Donell Beers and follow the proper course for care related to suicide precautions, etc.

## 2012-02-08 NOTE — Anesthesia Preprocedure Evaluation (Addendum)
Anesthesia Evaluation  Patient identified by MRN, date of birth, ID band Patient awake, Patient confused and Patient unresponsive    Reviewed: Allergy & Precautions, H&P , NPO status , Patient's Chart, lab work & pertinent test results, Unable to perform ROS - Chart review only  Airway Mallampati: II TM Distance: >3 FB Neck ROM: Full    Dental  (+) Poor Dentition and Missing   Pulmonary          Cardiovascular + Past MI Rhythm:regular Rate:Normal     Neuro/Psych    GI/Hepatic (+) Hepatitis -, C  Endo/Other    Renal/GU      Musculoskeletal   Abdominal   Peds  Hematology   Anesthesia Other Findings Pt. Intoxicated.  Reproductive/Obstetrics                         Anesthesia Physical Anesthesia Plan  ASA: III  Anesthesia Plan: General   Post-op Pain Management:    Induction: Intravenous, Rapid sequence and Cricoid pressure planned  Airway Management Planned: Oral ETT  Additional Equipment:   Intra-op Plan:   Post-operative Plan: Extubation in OR  Informed Consent:   Dental advisory given  Plan Discussed with: CRNA, Anesthesiologist and Surgeon  Anesthesia Plan Comments:        Anesthesia Quick Evaluation
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
## 2012-02-08 NOTE — Transfer of Care (Signed)
Immediate Anesthesia Transfer of Care Note  Patient: [name]  Procedure(s) Performed: Procedure(s) (LRB): EXPLORATORY LAPAROTOMY (N/A)  Patient Location: PACU  Anesthesia Type: General  Level of Consciousness: awake and alert   Airway & Oxygen Therapy: Patient connected to nasal cannula oxygen  Post-op Assessment: Report given to PACU RN and Post -op Vital signs reviewed and stable  Post vital signs: Reviewed and stable  Complications: No apparent anesthesia complications

## 2012-02-08 NOTE — Anesthesia Procedure Notes (Signed)
Procedure Name: Intubation Date/Time: 02/08/2012 6:25 PM Performed by: Molli Hazard Pre-anesthesia Checklist: Patient identified, Emergency Drugs available, Suction available and Patient being monitored Patient Re-evaluated:Patient Re-evaluated prior to inductionOxygen Delivery Method: Circle system utilized Preoxygenation: Pre-oxygenation with 100% oxygen Intubation Type: IV induction, Rapid sequence and Cricoid Pressure applied Laryngoscope Size: Miller and 2 Grade View: Grade I Tube type: Oral Tube size: 8.0 mm Number of attempts: 1 Airway Equipment and Method: Stylet Secured at: 22 cm Tube secured with: Tape Dental Injury: Teeth and Oropharynx as per pre-operative assessment
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
## 2012-02-08 NOTE — Progress Notes (Signed)
Patient stated that he wants his clothes and wants to leave the hospital.  He stated "Give me the piece of paper and I will sign it to let me go."  Dr. Donell Beers paged.  I informed Dr. Donell Beers that patient wanted to leave AMA.  Dr. Donell Beers stated to encourage him to stay per her recommendation, but if he won't stay let him sign out AMA.

## 2012-02-09 ENCOUNTER — Encounter (HOSPITAL_COMMUNITY): Payer: Self-pay | Admitting: General Surgery

## 2012-02-09 DIAGNOSIS — S31109A Unspecified open wound of abdominal wall, unspecified quadrant without penetration into peritoneal cavity, initial encounter: Secondary | ICD-10-CM

## 2012-02-09 DIAGNOSIS — F10239 Alcohol dependence with withdrawal, unspecified: Secondary | ICD-10-CM

## 2012-02-09 DIAGNOSIS — X781XXA Intentional self-harm by knife, initial encounter: Secondary | ICD-10-CM | POA: Diagnosis present

## 2012-02-09 DIAGNOSIS — F322 Major depressive disorder, single episode, severe without psychotic features: Secondary | ICD-10-CM

## 2012-02-09 DIAGNOSIS — F1020 Alcohol dependence, uncomplicated: Secondary | ICD-10-CM | POA: Diagnosis present

## 2012-02-09 DIAGNOSIS — F601 Schizoid personality disorder: Secondary | ICD-10-CM | POA: Diagnosis present

## 2012-02-09 DIAGNOSIS — X789XXA Intentional self-harm by unspecified sharp object, initial encounter: Secondary | ICD-10-CM

## 2012-02-09 DIAGNOSIS — F10229 Alcohol dependence with intoxication, unspecified: Secondary | ICD-10-CM

## 2012-02-09 DIAGNOSIS — S3681XA Injury of peritoneum, initial encounter: Principal | ICD-10-CM

## 2012-02-09 DIAGNOSIS — F329 Major depressive disorder, single episode, unspecified: Secondary | ICD-10-CM

## 2012-02-09 LAB — COMPREHENSIVE METABOLIC PANEL
Albumin: 2.8 g/dL — ABNORMAL LOW (ref 3.5–5.2)
BUN: 3 mg/dL — ABNORMAL LOW (ref 6–23)
Creatinine, Ser: 0.48 mg/dL — ABNORMAL LOW (ref 0.50–1.35)
GFR calc Af Amer: >90 mL/min (ref >90)
Total Protein: 6.5 g/dL (ref 6.0–8.3)

## 2012-02-09 LAB — PREPARE FRESH FROZEN PLASMA
Unit division: 0
Unit division: 0

## 2012-02-09 LAB — DIFFERENTIAL
Lymphs Abs: 1.2 10*3/uL (ref 0.7–4.0)
Monocytes Relative: 9 % (ref 3–12)
Neutro Abs: 4 10*3/uL (ref 1.7–7.7)
Neutrophils Relative %: 69 % (ref 43–77)

## 2012-02-09 LAB — PROTIME-INR
INR: 1.24 (ref 0.00–1.49)
INR: 1.18 (ref 0.00–1.49)
Prothrombin Time: 15.9 seconds — ABNORMAL HIGH (ref 11.6–15.2)

## 2012-02-09 LAB — CBC
HCT: 32.9 % — ABNORMAL LOW (ref 39.0–52.0)
Hemoglobin: 10.9 g/dL — ABNORMAL LOW (ref 13.0–17.0)
MCH: 30.1 pg (ref 26.0–34.0)
MCHC: 33.4 g/dL (ref 30.0–36.0)
MCV: 89.9 fL (ref 78.0–100.0)
RBC: 3.58 MIL/uL — ABNORMAL LOW (ref 4.22–5.81)
RDW: 15 % (ref 11.5–15.5)

## 2012-02-09 MED ORDER — LORAZEPAM 2 MG/ML IJ SOLN
1.0000 mg | Freq: Four times a day (QID) | INTRAMUSCULAR | Status: DC | PRN
  Administered 2012-02-09 – 2012-02-10 (×7): 1 mg via INTRAVENOUS
  Filled 2012-02-09 (×8): qty 1

## 2012-02-09 MED ORDER — SPIRITUS FRUMENTI
1.0000 | ORAL | Status: DC
  Administered 2012-02-09 – 2012-02-15 (×36): 1 via ORAL
  Filled 2012-02-09 (×48): qty 1

## 2012-02-09 MED ORDER — THIAMINE HCL 100 MG/ML IJ SOLN
100.0000 mg | Freq: Every day | INTRAMUSCULAR | Status: DC
  Filled 2012-02-09: qty 1

## 2012-02-09 MED ORDER — LORAZEPAM 1 MG PO TABS: 1.0000 mg | ORAL_TABLET | Freq: Four times a day (QID) | ORAL | Status: DC | PRN

## 2012-02-09 MED ORDER — FOLIC ACID 1 MG PO TABS
1.0000 mg | ORAL_TABLET | Freq: Every day | ORAL | Status: DC
  Administered 2012-02-09 – 2012-02-17 (×9): 1 mg via ORAL
  Filled 2012-02-09 (×9): qty 1

## 2012-02-09 MED ORDER — ACETAMINOPHEN 325 MG PO TABS
650.0000 mg | ORAL_TABLET | Freq: Four times a day (QID) | ORAL | Status: DC | PRN
  Administered 2012-02-09: 650 mg via ORAL
  Filled 2012-02-09: qty 2

## 2012-02-09 MED ORDER — VITAMIN B-1 100 MG PO TABS
100.0000 mg | ORAL_TABLET | Freq: Every day | ORAL | Status: DC
  Administered 2012-02-09 – 2012-02-17 (×9): 100 mg via ORAL
  Filled 2012-02-09 (×9): qty 1

## 2012-02-09 MED ORDER — ADULT MULTIVITAMIN W/MINERALS CH
1.0000 | ORAL_TABLET | Freq: Every day | ORAL | Status: DC
  Administered 2012-02-09 – 2012-02-17 (×9): 1 via ORAL
  Filled 2012-02-09 (×9): qty 1

## 2012-02-09 MED ORDER — SODIUM CHLORIDE 0.9 % IV SOLN
INTRAVENOUS | Status: DC
  Administered 2012-02-09: 18:00:00 via INTRAVENOUS

## 2012-02-09 NOTE — Progress Notes (Signed)
UR complete 
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
## 2012-02-09 NOTE — Progress Notes (Signed)
Pt t/x in bed to 5151. Report called to [facility] and she was aware of pt's arrival to floor.  Felipa Emory

## 2012-02-09 NOTE — Progress Notes (Signed)
IVC paperwork signed by MD, notarized and multiple copies faxed to Magistrate.  Original/copies placed on shadow chart.  CSW will follow pt and facilitate d/c to [facility] as bed becomes available.

## 2012-02-09 NOTE — Progress Notes (Signed)
Orthopedic Tech Progress Note Patient Details:  [name] 15-Nov-1955 [id number]  Other Ortho Devices Type of Ortho Device: Other (comment) (abd binder) Ortho Device Interventions: Casandra Doffing 02/09/2012, 2:41 PM

## 2012-02-09 NOTE — Progress Notes (Signed)
Pt is refusing to wear O2 sat probe. Pt is agitated stating he " wants to leave now". O2 sat spot check performed with a sat of 90% on RA. Pt asked to put on [place] for additional O2 and he refused. Pt educated on need to keep O2 level up, especially if he were to be receiving pain medicine- education redirection unsuccessful. MD Lindie Spruce made aware of pts unwillingness to cooperate.  Will monitor.   [name]
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
## 2012-02-09 NOTE — Progress Notes (Signed)
Nursing Note  Patient increasingly irritable and inconsolable.  Pain management attempted, Pt unwilling to have PCA started because "I am leaving here."  At this time patient is attempting to get out of bed, sitter and RN at bedside.  Able to get patient back in bed and lying down without force.  Pain medication given.  Patient states, "you have one hour before I am walking out."  AM labs pending.  Patient's abdomen is distended, firm and tender.  No BS heard, mid abdominal dressing with no new drainage.  HR trending up overnight, ST 110-120's.  Sitter and RN at bedside to monitor patient.  Security paged and updated of patient's status and requests to leave.   L Terris Germano RN

## 2012-02-09 NOTE — Progress Notes (Signed)
Pt's temp 102.1, HR 132. MD notified. Orders received for Tylenol and IVF.

## 2012-02-10 DIAGNOSIS — D62 Acute posthemorrhagic anemia: Secondary | ICD-10-CM | POA: Diagnosis not present

## 2012-02-10 DIAGNOSIS — F329 Major depressive disorder, single episode, unspecified: Secondary | ICD-10-CM | POA: Diagnosis present

## 2012-02-10 LAB — CBC
HCT: 30.6 % — ABNORMAL LOW (ref 39.0–52.0)
Hemoglobin: 10.6 g/dL — ABNORMAL LOW (ref 13.0–17.0)
RDW: 14.6 % (ref 11.5–15.5)
WBC: 6.2 10*3/uL (ref 4.0–10.5)

## 2012-02-10 LAB — DIFFERENTIAL
Basophils Absolute: 0 10*3/uL (ref 0.0–0.1)
Lymphocytes Relative: 16 % (ref 12–46)
Monocytes Absolute: 0.8 10*3/uL (ref 0.1–1.0)
Monocytes Relative: 12 % (ref 3–12)
Neutro Abs: 4.4 10*3/uL (ref 1.7–7.7)

## 2012-02-10 LAB — BASIC METABOLIC PANEL
CO2: 22 mEq/L (ref 19–32)
Chloride: 96 mEq/L (ref 96–112)
Creatinine, Ser: 0.5 mg/dL (ref 0.50–1.35)

## 2012-02-10 MED ORDER — LORAZEPAM 2 MG/ML IJ SOLN
0.5000 mg | INTRAMUSCULAR | Status: DC | PRN
  Administered 2012-02-11 – 2012-02-15 (×9): 1 mg via INTRAVENOUS
  Filled 2012-02-10 (×10): qty 1

## 2012-02-10 MED ORDER — MORPHINE SULFATE 4 MG/ML IJ SOLN
4.0000 mg | INTRAMUSCULAR | Status: DC | PRN
  Administered 2012-02-17 (×2): 4 mg via INTRAVENOUS
  Filled 2012-02-10 (×2): qty 1

## 2012-02-10 MED ORDER — LORAZEPAM 2 MG/ML IJ SOLN
1.0000 mg | Freq: Once | INTRAMUSCULAR | Status: AC
  Administered 2012-02-10: 1 mg via INTRAVENOUS

## 2012-02-10 MED ORDER — OXYCODONE HCL 5 MG PO TABS
10.0000 mg | ORAL_TABLET | ORAL | Status: DC | PRN
  Administered 2012-02-13: 10 mg via ORAL
  Administered 2012-02-13: 5 mg via ORAL
  Administered 2012-02-13 – 2012-02-16 (×6): 10 mg via ORAL
  Filled 2012-02-10: qty 2
  Filled 2012-02-10: qty 1
  Filled 2012-02-10 (×5): qty 2

## 2012-02-10 MED ORDER — ENOXAPARIN SODIUM 40 MG/0.4ML ~~LOC~~ SOLN
40.0000 mg | SUBCUTANEOUS | Status: DC
  Administered 2012-02-10 – 2012-02-13 (×4): 40 mg via SUBCUTANEOUS
  Filled 2012-02-10 (×5): qty 0.4

## 2012-02-10 MED ORDER — OXYCODONE HCL 5 MG PO TABS: 2.5000 mg | ORAL_TABLET | ORAL | Status: DC | PRN
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
## 2012-02-10 NOTE — Progress Notes (Signed)
Drank his beer with breakfast Doing well [facility] Patient examined and I agree with the assessment and plan  Violeta Gelinas, MD, MPH, FACS Pager: [phone]  02/10/2012 10:23 AM

## 2012-02-10 NOTE — Progress Notes (Signed)
Psych CSW following to assist with Pt's acceptance and transfer to [facility] for psychiatric treatment. Pt's info was faxed for review, Elnita Maxwell at [facility] confirmed that they have received it and have given it to the nurse for review.  CSW obtained authorization code from [place] for Pt's treatment.  CSW staff will continue to f/u with [facility] to confirm that Pt is on the wait list.  Frederico Hamman, LCSW [phone]

## 2012-02-11 DIAGNOSIS — E876 Hypokalemia: Secondary | ICD-10-CM

## 2012-02-11 LAB — CBC
HCT: 29.8 % — ABNORMAL LOW (ref 39.0–52.0)
Hemoglobin: 10.6 g/dL — ABNORMAL LOW (ref 13.0–17.0)
MCV: 86.6 fL (ref 78.0–100.0)
RDW: 14.5 % (ref 11.5–15.5)
WBC: 8 10*3/uL (ref 4.0–10.5)

## 2012-02-11 LAB — BASIC METABOLIC PANEL
BUN: 6 mg/dL (ref 6–23)
CO2: 20 mEq/L (ref 19–32)
Chloride: 97 mEq/L (ref 96–112)
Creatinine, Ser: 0.49 mg/dL — ABNORMAL LOW (ref 0.50–1.35)
Glucose, Bld: 121 mg/dL — ABNORMAL HIGH (ref 70–99)

## 2012-02-11 MED ORDER — POTASSIUM CHLORIDE 20 MEQ/15ML (10%) PO LIQD
20.0000 meq | Freq: Two times a day (BID) | ORAL | Status: DC
  Administered 2012-02-11 – 2012-02-12 (×3): 20 meq via ORAL
  Filled 2012-02-11 (×5): qty 15
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
## 2012-02-12 ENCOUNTER — Encounter (HOSPITAL_COMMUNITY): Payer: Self-pay | Admitting: *Deleted

## 2012-02-12 LAB — TYPE AND SCREEN
Antibody Screen: NEGATIVE
Unit division: 0
Unit division: 0
Unit division: 0
Unit division: 0

## 2012-02-12 LAB — BASIC METABOLIC PANEL
BUN: 8 mg/dL (ref 6–23)
CO2: 23 mEq/L (ref 19–32)
Calcium: 8.3 mg/dL — ABNORMAL LOW (ref 8.4–10.5)
Creatinine, Ser: 0.5 mg/dL (ref 0.50–1.35)
Glucose, Bld: 100 mg/dL — ABNORMAL HIGH (ref 70–99)

## 2012-02-12 MED ORDER — NAPHAZOLINE-PHENIRAMINE 0.025-0.3 % OP SOLN
1.0000 [drp] | Freq: Four times a day (QID) | OPHTHALMIC | Status: DC | PRN
  Administered 2012-02-12 – 2012-02-17 (×3): 1 [drp] via OPHTHALMIC
  Filled 2012-02-12: qty 15

## 2012-02-12 MED ORDER — NAPHAZOLINE HCL 0.1 % OP SOLN
1.0000 [drp] | Freq: Four times a day (QID) | OPHTHALMIC | Status: DC | PRN
  Filled 2012-02-12: qty 15

## 2012-02-12 MED ORDER — POTASSIUM CHLORIDE 20 MEQ/15ML (10%) PO LIQD
40.0000 meq | Freq: Two times a day (BID) | ORAL | Status: DC
  Administered 2012-02-12 – 2012-02-17 (×10): 40 meq via ORAL
  Filled 2012-02-12 (×12): qty 30
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
## 2012-02-12 NOTE — Progress Notes (Signed)
Additional clinicals faxed to [facility]. Milus Banister MSW,LCSW w/e Coverage [phone]

## 2012-02-13 ENCOUNTER — Encounter (HOSPITAL_COMMUNITY): Payer: Self-pay | Admitting: *Deleted

## 2012-02-13 LAB — BASIC METABOLIC PANEL
BUN: 8 mg/dL (ref 6–23)
CO2: 22 mEq/L (ref 19–32)
Calcium: 8.7 mg/dL (ref 8.4–10.5)
GFR calc non Af Amer: >90 mL/min (ref >90)
Glucose, Bld: 105 mg/dL — ABNORMAL HIGH (ref 70–99)

## 2012-02-13 LAB — CBC
HCT: 33.7 % — ABNORMAL LOW (ref 39.0–52.0)
MCHC: 33.8 g/dL (ref 30.0–36.0)
MCV: 89.9 fL (ref 78.0–100.0)
Platelets: 92 10*3/uL — ABNORMAL LOW (ref 150–400)
RDW: 15.5 % (ref 11.5–15.5)

## 2012-02-13 MED ORDER — BACITRACIN ZINC 500 UNIT/GM EX OINT
TOPICAL_OINTMENT | Freq: Two times a day (BID) | CUTANEOUS | Status: DC
  Administered 2012-02-13 – 2012-02-14 (×2): via TOPICAL
  Administered 2012-02-14: 1 via TOPICAL
  Administered 2012-02-15 – 2012-02-17 (×4): via TOPICAL
  Filled 2012-02-13: qty 15

## 2012-02-13 MED ORDER — DOXYCYCLINE HYCLATE 100 MG PO TABS
100.0000 mg | ORAL_TABLET | Freq: Two times a day (BID) | ORAL | Status: DC
  Administered 2012-02-13 – 2012-02-17 (×9): 100 mg via ORAL
  Filled 2012-02-13 (×10): qty 1

## 2012-02-13 MED ORDER — DIPHENHYDRAMINE HCL 25 MG PO CAPS: 25.0000 mg | ORAL_CAPSULE | Freq: Four times a day (QID) | ORAL | Status: DC | PRN
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
## 2012-02-13 NOTE — Progress Notes (Signed)
Erythema R side of wound - add Doxycycline Await bed at [facility] CIWA + beer Patient examined and I agree with the assessment and plan  Violeta Gelinas, MD, MPH, FACS Pager: [phone]  02/13/2012 9:48 AM

## 2012-02-13 NOTE — Progress Notes (Signed)
Clinical Social Work  CSW spoke with [name] who reported that they needed additional information. CSW faxed information to [facility] and called to confirm that fax was received. [facility] stated they would call CSW if any further information was needed. CSW will continue to follow.   [place], [place] [phone]
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
## 2012-02-14 ENCOUNTER — Encounter (HOSPITAL_COMMUNITY): Payer: Self-pay | Admitting: *Deleted

## 2012-02-14 LAB — CBC
MCH: 30.2 pg (ref 26.0–34.0)
MCHC: 34 g/dL (ref 30.0–36.0)
Platelets: 93 10*3/uL — ABNORMAL LOW (ref 150–400)
RBC: 3.31 MIL/uL — ABNORMAL LOW (ref 4.22–5.81)

## 2012-02-14 LAB — BASIC METABOLIC PANEL
Calcium: 8.6 mg/dL (ref 8.4–10.5)
GFR calc Af Amer: >90 mL/min (ref >90)
GFR calc non Af Amer: >90 mL/min (ref >90)
Sodium: 130 mEq/L — ABNORMAL LOW (ref 135–145)

## 2012-02-14 MED ORDER — SODIUM CHLORIDE 1 G PO TABS
1.0000 g | ORAL_TABLET | Freq: Three times a day (TID) | ORAL | Status: DC
  Administered 2012-02-14 – 2012-02-17 (×10): 1 g via ORAL
  Filled 2012-02-14 (×12): qty 1

## 2012-02-14 NOTE — Progress Notes (Signed)
Refused dressing change.  

## 2012-02-14 NOTE — Progress Notes (Signed)
Clinical Social Work:  Following patient for referral to [facility]: [facility] for suicide attempt.  Spoke with admitting Junious Dresser with regards to patient being accepted or pending.  Patient remains pending due to medical needs and more medical documentation/clinicals were faxed over for review.  Will speak with Junious Dresser again once reviewed in hopes patient will be accepted and placed on wait list.  Anticipated DC:  [facility] once accepted and placed on wait list.  Still pending at this time.  Ashley Jacobs, MSW LCSW [phone]
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
## 2012-02-14 NOTE — Progress Notes (Signed)
UR complete 
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
## 2012-02-15 LAB — CBC
Platelets: 110 10*3/uL — ABNORMAL LOW (ref 150–400)
RBC: 3.34 MIL/uL — ABNORMAL LOW (ref 4.22–5.81)
WBC: 7.2 10*3/uL (ref 4.0–10.5)

## 2012-02-15 LAB — BASIC METABOLIC PANEL
CO2: 26 mEq/L (ref 19–32)
Calcium: 8.9 mg/dL (ref 8.4–10.5)
Chloride: 99 mEq/L (ref 96–112)
Potassium: 3.7 mEq/L (ref 3.5–5.1)
Sodium: 130 mEq/L — ABNORMAL LOW (ref 135–145)

## 2012-02-15 MED ORDER — SPIRITUS FRUMENTI
1.0000 | Freq: Every day | ORAL | Status: DC
  Administered 2012-02-15 – 2012-02-16 (×5): 1 via ORAL
  Filled 2012-02-15 (×11): qty 1
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
## 2012-02-15 NOTE — Progress Notes (Signed)
Patient is refusing for nurse to assess abdominal wound and do dressing change.  I explained to him the importance of wound assessment for infection, etc.   The patient is adamant that I need to "leave him alone and get out".  Will continue to follow and will try again later for dressing change.   Patient is on CIWA and score at 0745 was 15.  Roland Rack, RN

## 2012-02-15 NOTE — Progress Notes (Signed)
Pt discussed in hospital LOS meeting this am.  

## 2012-02-15 NOTE — Progress Notes (Signed)
We can stop CBC checks.  Awaiting placement.  This patient has been seen and I agree with the findings and treatment plan.  Marta Lamas. Gae Bon, MD, FACS [phone] (pager) [phone] (direct pager) Trauma Surgeon

## 2012-02-16 LAB — BASIC METABOLIC PANEL
CO2: 22 mEq/L (ref 19–32)
Chloride: 102 mEq/L (ref 96–112)
Creatinine, Ser: 0.53 mg/dL (ref 0.50–1.35)

## 2012-02-16 LAB — CBC
HCT: 32.4 % — ABNORMAL LOW (ref 39.0–52.0)
MCV: 90.3 fL (ref 78.0–100.0)
Platelets: 146 10*3/uL — ABNORMAL LOW (ref 150–400)
RBC: 3.59 MIL/uL — ABNORMAL LOW (ref 4.22–5.81)
WBC: 5.3 10*3/uL (ref 4.0–10.5)

## 2012-02-16 MED ORDER — ALPRAZOLAM 0.5 MG PO TABS
1.0000 mg | ORAL_TABLET | Freq: Three times a day (TID) | ORAL | Status: DC | PRN
  Administered 2012-02-16 (×2): 1 mg via ORAL
  Administered 2012-02-17: 0.5 mg via ORAL
  Administered 2012-02-17: 1 mg via ORAL
  Filled 2012-02-16: qty 2
  Filled 2012-02-16: qty 1
  Filled 2012-02-16: qty 2
  Filled 2012-02-16 (×2): qty 1

## 2012-02-16 MED ORDER — SPIRITUS FRUMENTI
1.0000 | Freq: Four times a day (QID) | ORAL | Status: AC
  Administered 2012-02-16 (×4): 1 via ORAL
  Filled 2012-02-16 (×4): qty 1

## 2012-02-16 MED ORDER — HYDROCORTISONE 0.5 % EX CREA
TOPICAL_CREAM | Freq: Two times a day (BID) | CUTANEOUS | Status: DC
  Administered 2012-02-16 (×2): via TOPICAL
  Filled 2012-02-16 (×2): qty 28.35

## 2012-02-16 MED ORDER — SPIRITUS FRUMENTI: 1.0000 | Freq: Once | ORAL | Status: DC

## 2012-02-16 MED ORDER — SPIRITUS FRUMENTI
1.0000 | Freq: Three times a day (TID) | ORAL | Status: DC
  Administered 2012-02-17: 1 via ORAL
  Filled 2012-02-16 (×4): qty 1

## 2012-02-16 MED ORDER — SPIRITUS FRUMENTI: 1.0000 | Freq: Two times a day (BID) | ORAL | Status: DC

## 2012-02-16 MED ORDER — HYDROCODONE-ACETAMINOPHEN 5-325 MG PO TABS
0.5000 | ORAL_TABLET | ORAL | Status: DC | PRN
Start: 2012-02-16 — End: 2012-02-17
  Administered 2012-02-16 – 2012-02-17 (×6): 2 via ORAL
  Filled 2012-02-16 (×6): qty 2
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
## 2012-02-16 NOTE — Progress Notes (Signed)
Wound erythema resolving Weaning beer Patient examined and I agree with the assessment and plan  Violeta Gelinas, MD, MPH, FACS Pager: [phone]  02/16/2012 10:28 AM

## 2012-02-16 NOTE — Progress Notes (Signed)
Clinical Social Work: Archivist  Following patient for admission to [facility].  Patient has been accepted and placed on wait list.  Unclear at this time how long patient will have to wait for bed, however will update IVC paperwork for patient's safety as well as transport and will continue to follow.  Ashley Jacobs, MSW LCSW [phone]

## 2012-02-17 LAB — BASIC METABOLIC PANEL
CO2: 27 mEq/L (ref 19–32)
Calcium: 8.8 mg/dL (ref 8.4–10.5)
Glucose, Bld: 88 mg/dL (ref 70–99)
Sodium: 134 mEq/L — ABNORMAL LOW (ref 135–145)

## 2012-02-17 IMAGING — CR DG ABDOMEN ACUTE W/ 1V CHEST
3 series · 3 of 3 positions shown · non-contrast
Comparison: Chest x-ray 12/06/2009 and abdomen series 11/08/2009

CLINICAL DATA: Epigastric pain.  Vomiting blood.  History of
esophageal tear.

ACUTE ABDOMEN SERIES (ABDOMEN 2 VIEW & CHEST 1 VIEW)

[w chest pa]
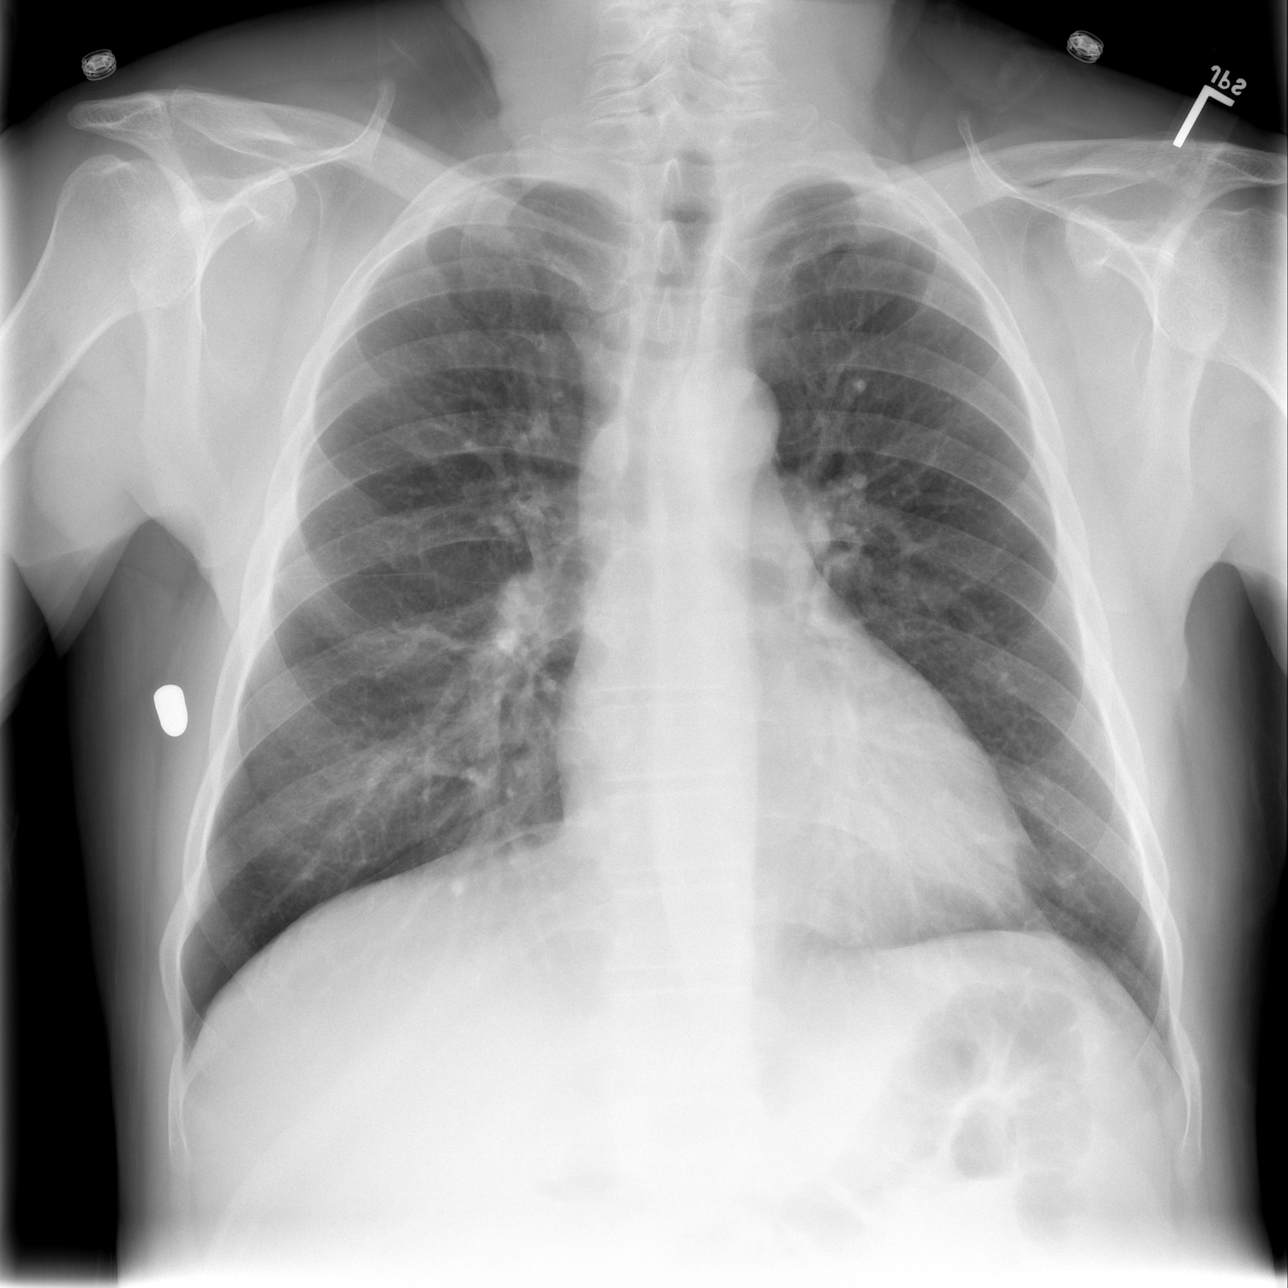

[w abdomen upright]
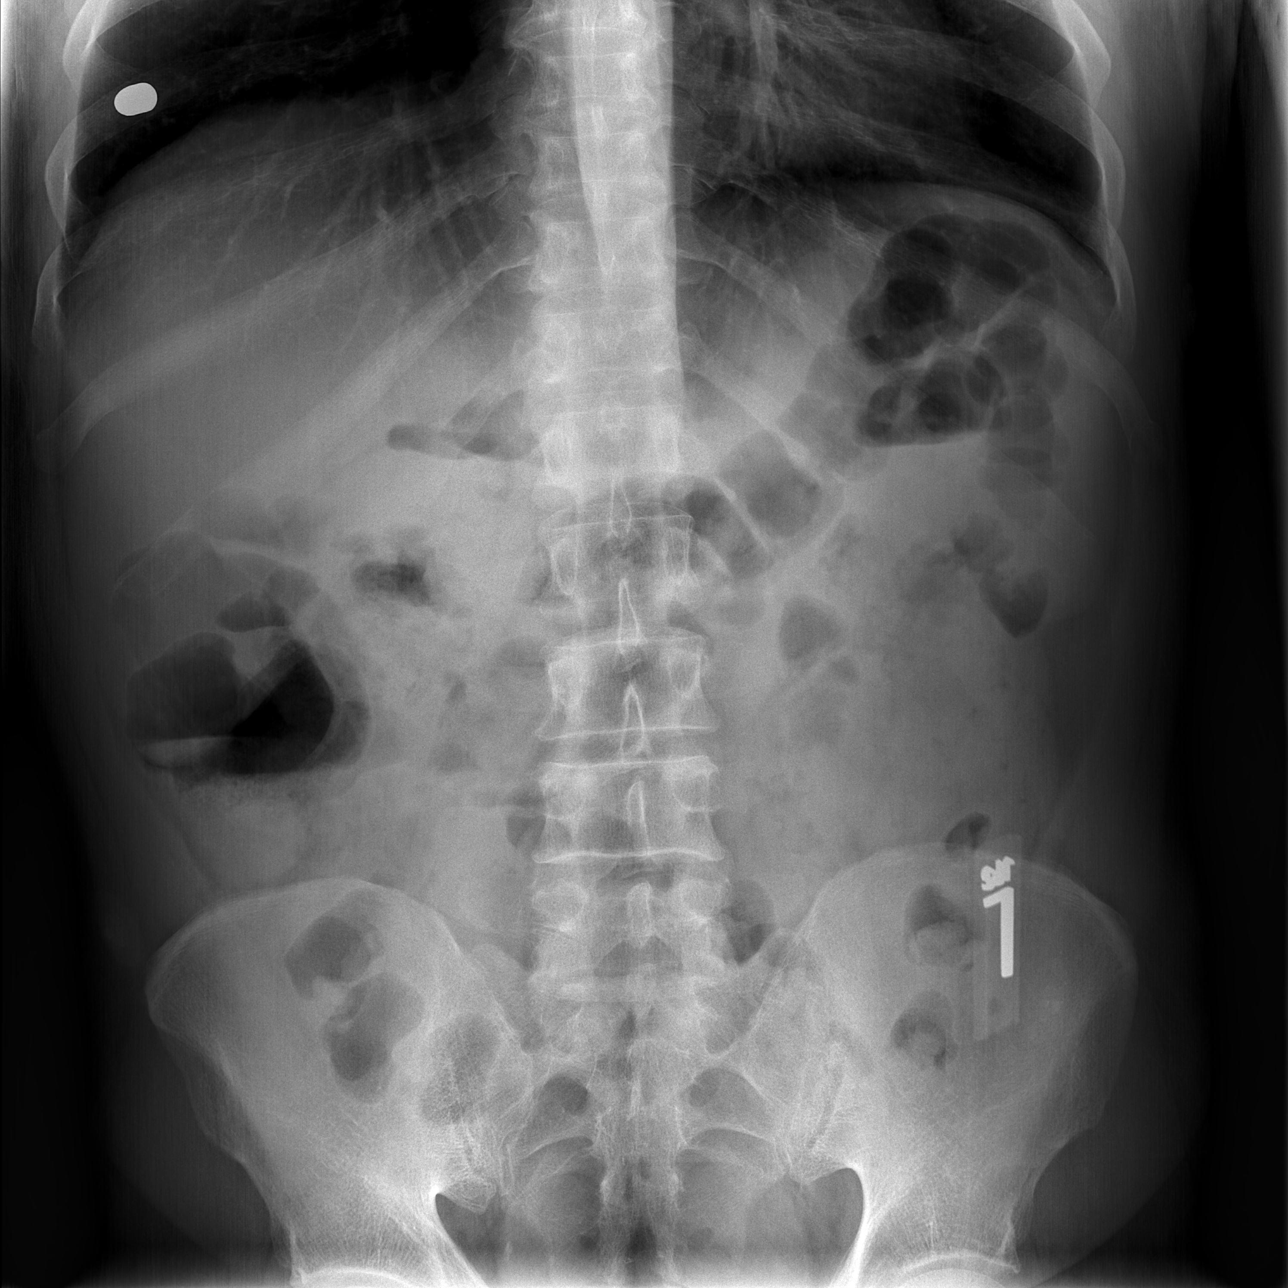

[t abdomen supine]
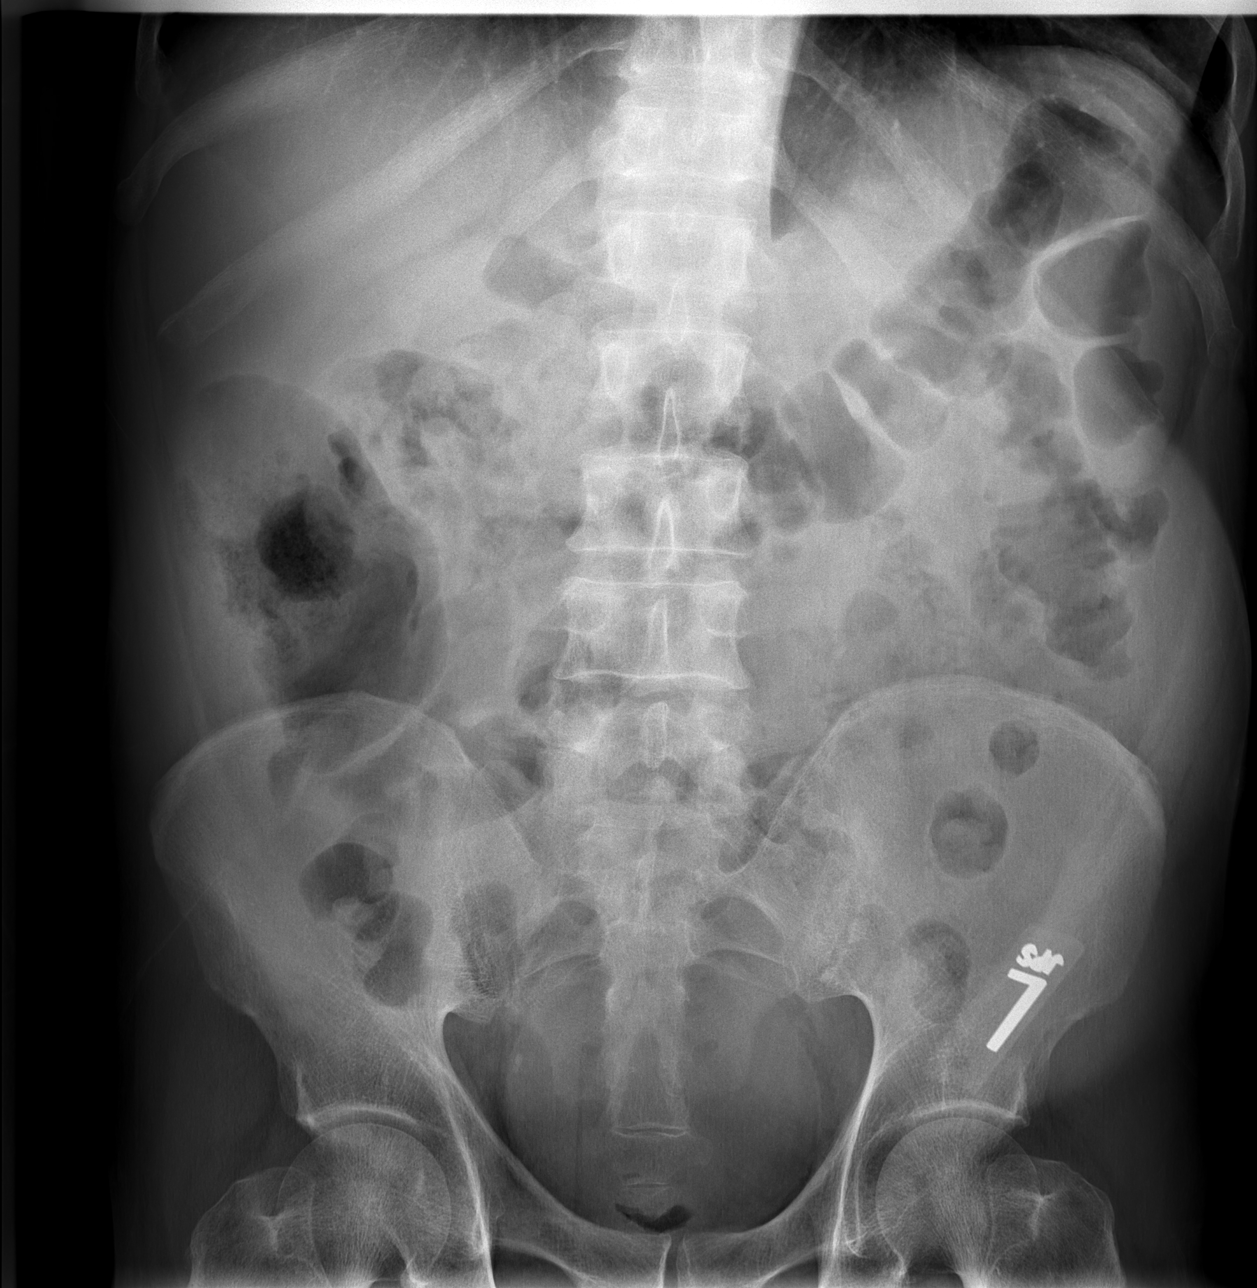

[3 of 3 positions shown; findings below may reference images not displayed]

FINDINGS: Frontal view of the chest shows midline trachea and
normal heart size.  Lungs are clear.  No pleural fluid.

Two views of the abdomen show gas and stool scattered in mildly
prominent colon.  There are scattered loops of gas-filled small
bowel, some of which are minimally prominent.
IMPRESSION: Bowel gas pattern is nonspecific.  No evidence of overt
obstruction.

## 2012-02-17 MED ORDER — DOXYCYCLINE HYCLATE 100 MG PO TABS: 100.0000 mg | ORAL_TABLET | Freq: Two times a day (BID) | ORAL | Status: DC

## 2012-02-17 MED ORDER — HYDROCODONE-ACETAMINOPHEN 5-325 MG PO TABS: 1.0000 | ORAL_TABLET | ORAL | Status: DC | PRN

## 2012-02-17 MED ORDER — TRAMADOL HCL 50 MG PO TABS: 50.0000 mg | ORAL_TABLET | Freq: Four times a day (QID) | ORAL | Status: DC | PRN

## 2012-02-17 NOTE — Discharge Summary (Signed)
[name] Edmonds, MD, MPH, FACS Pager: [phone]  
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
## 2012-02-17 NOTE — Progress Notes (Signed)
Clinical Social Work Progress Note PSYCHIATRY SERVICE LINE 02/17/2012  Patient:  [name]  Account:  [id number]  Admit Date:  02/08/2012  Clinical Social Worker:  Ashley Jacobs, LCSW  Date/Time:  02/17/2012 09:45 AM  Review of Patient  Overall Medical Condition:   Patient is overall medically stable.  He has wound care needs/dressings on his abodomen, however this can be performed outside the hospital.    Currently patient awaits placement at [facility] for Psych Behaviors.   Participation Level:  Active  Participation Quality  Appropriate  Attentive   Other Participation Quality:   Patient was cooperative and very honest  Good eye contact   Affect  Flat   Cognitive  Alert  Oriented   Reaction to Medications/Concerns:   None at this time.  patient has been compliant with medical treatment and taking medicaitons   Modes of Intervention  Behaviors/Psychosis  Solution-Focused  Reality-Testing   Summary of Progress/Plan at Discharge   Met with patient at the bedside who was very open and cooperative during assessment.  Patient is very well known to this Clinical research associate as well as the CSW department and Trauma Team.  Patient discusses his motives of stabbing himself and limited insight at the time due to drinking liquor late at night.  Patient reports at this time he is not suicidal, not having thoughts or plans to hurt himself, and not a danger to himself or others.  He reports he is not having AVH. patient is very alert and oriented reports he lives in his tent with [facility] and keeps to himself.  reports he is still grieving his deceased wife and misses her daily.  Reports he has no intentions to hurt himself at this time and that going to a mental health hospital will not benefit him and will waste the State's money.  he reports if he goes he will not be compliant and he will not participate.  Reports he does not want to go.  Reports if he were thinking of hurting himself he would actually do it  and knows how to do it and would not bring himself to the hospital.  Reports he has a job waiting on him in the community, as well as legal issues pending.  He does not want SA treatment for alcohol and does not want any MH treatment.  Discussed case with Dr. Ferol Luz and at this time patient is not meeting criteria for IVC paperwork to be renewed.  He is depressed and that is known however with above mentioned behaviors not active, patient is not a danger to himself or others.  Dr. Ferol Luz agrees that patient should dc home to his tent.  Patient and CSW discussed a safety plan and patient reports like always he will return to the hospital if he needs too.  At this time. Patient is not under IVC and will not be recommitted.  Per pysch patient is cleared and will be able to dc home and taken off of [facility] wait list.  Patient is aware to follow up at mental health: [facility]. Patient reports he will be compliant with his daily wound dressings.  No other needs at this time.  If needs arise please contact CSW.    Ashley Jacobs, MSW LCSW [phone]

## 2012-02-17 NOTE — Discharge Instructions (Signed)
Wash wound daily with soap and water. Apply moist gauze in open wound twice daily.  No lifting for 6 more weeks.
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
## 2012-02-17 NOTE — Progress Notes (Addendum)
Pt was given discharge instructions and was given back all of his belongings from security. Pt said that he was not going to come back for follow up appt for staple removal that he would take them out himself. Pt is reluctant to wait for meds from pharmacy. We are waiting for abx and pain medications. Rn tried to instruct patient on how to do dressing change but patient did not listen nor did he have any interest in watching the dressing change. Pt was given tramadol and abx. Gave scripts for vicodin because pharmacy would not fill.
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
## 2012-02-18 ENCOUNTER — Encounter (HOSPITAL_COMMUNITY): Payer: Self-pay | Admitting: *Deleted

## 2012-02-18 ENCOUNTER — Emergency Department (HOSPITAL_COMMUNITY)
Admission: EM | Admit: 2012-02-18 | Discharge: 2012-02-19 | Disposition: A | Discharge status: Home / Self Care | Payer: Medicaid Other | Attending: Emergency Medicine | Admitting: Emergency Medicine

## 2012-02-18 DIAGNOSIS — R109 Unspecified abdominal pain: Secondary | ICD-10-CM | POA: Insufficient documentation

## 2012-02-18 DIAGNOSIS — IMO0001 Reserved for inherently not codable concepts without codable children: Secondary | ICD-10-CM

## 2012-02-18 DIAGNOSIS — B1920 Unspecified viral hepatitis C without hepatic coma: Secondary | ICD-10-CM | POA: Insufficient documentation

## 2012-02-18 DIAGNOSIS — Z4800 Encounter for change or removal of nonsurgical wound dressing: Secondary | ICD-10-CM | POA: Insufficient documentation

## 2012-02-18 NOTE — ED Notes (Addendum)
Per EMS: pt had surgery sometime this past week and left the day before yesterday? Pt unsure. Pt states that his wound started opening and that a few of the staples came out. EMS out dressing over area, pt has an approx. Inch area that is open missing staples, bleeding controlled at this time. Pt not cooperative or willing to answer questions.

## 2012-02-19 NOTE — Discharge Instructions (Signed)
Change dressing twice a day. Paxil with gauze and place bandage on top. Return to the emergency room for redness, swelling, drainage pus, or other concerning symptoms. Please followup with surgery clinic for recheck in 3-5 days  Dressing Change Dressings are placed over wounds to keep them clean, dry, and protected from further injury. This provides an environment that favors wound healing. Good wound care includes resting and elevating the injured part until the pain and swelling are better. Change your wound dressing as recommended by your caregiver. When removing an old dressing, lift it slowly away from the wound. If the dressing sticks to the wound, dampen it with half-strength peroxide or tap water. Clean the wound gently with a moist cloth, remove any loose material, and apply antibiotic ointment if recommended by your caregiver. Usually it is okay for a wound to get wet. Wash it with mildly soapy water. Watch for signs of infection when changing a dressing. SEEK MEDICAL CARE IF:  You develop increased pain, redness, or swelling.   You have pus-like drainage from the wound.   You develop a fever greater than 100.4 F (38 C).  Document Released: 11/10/2004 Document Revised: 09/22/2011 Document Reviewed: 08/15/2011 [facility] Patient Information 2012 [place], [name].
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
## 2012-02-20 ENCOUNTER — Encounter (HOSPITAL_COMMUNITY): Payer: Self-pay | Admitting: *Deleted

## 2012-02-20 LAB — WOUND CULTURE

## 2012-02-23 ENCOUNTER — Encounter (INDEPENDENT_AMBULATORY_CARE_PROVIDER_SITE_OTHER): Payer: Self-pay

## 2012-02-23 ENCOUNTER — Encounter (HOSPITAL_COMMUNITY): Payer: Self-pay | Admitting: Emergency Medicine

## 2012-02-23 ENCOUNTER — Emergency Department (HOSPITAL_COMMUNITY)
Admission: EM | Admit: 2012-02-23 | Discharge: 2012-02-23 | Disposition: A | Discharge status: Home / Self Care | Payer: Medicaid Other | Attending: Emergency Medicine | Admitting: Emergency Medicine

## 2012-02-23 DIAGNOSIS — F10929 Alcohol use, unspecified with intoxication, unspecified: Secondary | ICD-10-CM

## 2012-02-23 DIAGNOSIS — I10 Essential (primary) hypertension: Secondary | ICD-10-CM | POA: Insufficient documentation

## 2012-02-23 DIAGNOSIS — F329 Major depressive disorder, single episode, unspecified: Secondary | ICD-10-CM | POA: Insufficient documentation

## 2012-02-23 DIAGNOSIS — Y849 Medical procedure, unspecified as the cause of abnormal reaction of the patient, or of later complication, without mention of misadventure at the time of the procedure: Secondary | ICD-10-CM | POA: Insufficient documentation

## 2012-02-23 DIAGNOSIS — B182 Chronic viral hepatitis C: Secondary | ICD-10-CM | POA: Insufficient documentation

## 2012-02-23 DIAGNOSIS — T8189XA Other complications of procedures, not elsewhere classified, initial encounter: Secondary | ICD-10-CM | POA: Insufficient documentation

## 2012-02-23 DIAGNOSIS — F1010 Alcohol abuse, uncomplicated: Secondary | ICD-10-CM | POA: Insufficient documentation

## 2012-02-23 DIAGNOSIS — F3289 Other specified depressive episodes: Secondary | ICD-10-CM | POA: Insufficient documentation

## 2012-02-23 DIAGNOSIS — S31109A Unspecified open wound of abdominal wall, unspecified quadrant without penetration into peritoneal cavity, initial encounter: Secondary | ICD-10-CM

## 2012-02-23 LAB — CBC
HCT: 34.4 % — ABNORMAL LOW (ref 39.0–52.0)
Hemoglobin: 11.8 g/dL — ABNORMAL LOW (ref 13.0–17.0)
MCH: 29.9 pg (ref 26.0–34.0)
MCHC: 34.3 g/dL (ref 30.0–36.0)
RDW: 14.8 % (ref 11.5–15.5)

## 2012-02-23 LAB — ETHANOL: Alcohol, Ethyl (B): 326 mg/dL — ABNORMAL HIGH (ref 0–11)

## 2012-02-23 LAB — COMPREHENSIVE METABOLIC PANEL
Albumin: 3.4 g/dL — ABNORMAL LOW (ref 3.5–5.2)
BUN: <3 mg/dL — ABNORMAL LOW (ref 6–23)
Calcium: 8.9 mg/dL (ref 8.4–10.5)
Creatinine, Ser: 0.5 mg/dL (ref 0.50–1.35)
GFR calc Af Amer: >90 mL/min (ref >90)
Glucose, Bld: 102 mg/dL — ABNORMAL HIGH (ref 70–99)
Total Protein: 8.4 g/dL — ABNORMAL HIGH (ref 6.0–8.3)

## 2012-02-23 LAB — ACETAMINOPHEN LEVEL: Acetaminophen (Tylenol), Serum: <15 ug/mL (ref 10–30)

## 2012-02-23 MED ORDER — ZIPRASIDONE MESYLATE 20 MG IM SOLR: 20.0000 mg | Freq: Once | INTRAMUSCULAR | Status: DC

## 2012-02-23 NOTE — ED Notes (Signed)
Met with pt for supportive visit.  Pt anxious for telepsych interview to begin, pt wants to leave.  Pt reassured that telepsych would begin shortly and that he she stay willingly for the session.  Pt agreed.  Emotional support offered.

## 2012-02-23 NOTE — ED Notes (Signed)
Patient participating in tele-psych conference at this time

## 2012-02-23 NOTE — Discharge Instructions (Signed)
Continue routine wound care. The wound on her abdomen will take several months to close completely. Return if you're having any problems.  Alcohol Intoxication You have alcohol intoxication when the amount of alcohol that you have consumed has impaired your ability to mentally and physically function. There are a variety of factors that contribute to the level at which alcohol intoxication can occur, such as age, gender, weight, frequency of alcohol consumption, medication use, and the presence of other medical conditions, such as diabetes, seizures, or heart conditions. The blood alcohol level test measures the concentration of alcohol in your blood. In most states, your blood alcohol level must be lower than 80 mg/dL (4.69%) to legally drive. However, many dangerous effects of alcohol can occur at much lower levels. Alcohol directly impairs the normal chemical activity of the brain and is said to be a chemical depressant. Alcohol can cause drowsiness, stupor, respiratory failure, and coma. Other physical effects can include headache, vomiting, vomiting of blood, abdominal pain, a fast heartbeat, difficulty breathing, anxiety, and amnesia. Alcohol intoxication can also lead to dangerous and life-threatening activities, such as fighting, dangerous operation of vehicles or heavy machinery, and risky sexual behavior. Alcohol can be especially dangerous when taken with other drugs. Some of these drugs are:  Sedatives.   Painkillers.   Marijuana.   Tranquilizers.   Antihistamines.   Muscle relaxants.   Seizure medicine.  Many of the effects of acute alcohol intoxication are temporary. However, repeated alcohol intoxication can lead to severe medical illnesses. If you have alcohol intoxication, you should:  Stay hydrated. Drink enough water and fluids to keep your urine clear or pale yellow. Avoid excessive caffeine because this can further lead to dehydration.   Eat a healthy diet. You may have  residual nausea, headache, and loss of appetite, but it is still important that you maintain good nutrition. You can start with clear liquids.   Take nonsteroidal anti-inflammatory medications as needed for headaches, but make sure to do so with small meals. You should avoid acetaminophen for several days after having alcohol intoxication because the combination of alcohol and acetaminophen can be toxic to your liver.  If you have frequent alcohol intoxication, ask your friends and family if they think you have a drinking problem. For further help, contact:  Your caregiver.   Alcoholics Anonymous (AA).   A drug or alcohol rehabilitation program.  SEEK MEDICAL CARE IF:   You have persistent vomiting.   You have persistent pain in any part of your body.   You do not feel better after a few days.  SEEK IMMEDIATE MEDICAL CARE IF:   You become shaky or tremble when you try to stop drinking.   You shake uncontrollably (seizure).   You throw up (vomit) blood. This may be bright red or it may look like black coffee grounds.   You have blood in the stool. This may be bright red or appear as a black, tarry, bad smelling stool.   You become lightheaded or faint.  ANY OF THESE SYMPTOMS MAY REPRESENT A SERIOUS PROBLEM THAT IS AN EMERGENCY. Do not wait to see if the symptoms will go away. Get medical help right away. Call your local emergency services (911 in [place] DO NOT drive yourself to the hospital. MAKE SURE YOU:   Understand these instructions.   Will watch your condition.   Will get help right away if you are not doing well or get worse.  Document Released: 07/13/2005 Document Revised: 09/22/2011 Document  Reviewed: 03/22/2010 [facility] Patient Information 2012 [place], [name].
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
## 2012-02-23 NOTE — ED Notes (Signed)
Patient got tray 
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
## 2012-03-09 ENCOUNTER — Encounter (HOSPITAL_COMMUNITY): Payer: Self-pay | Admitting: *Deleted

## 2012-03-09 ENCOUNTER — Emergency Department (HOSPITAL_COMMUNITY)
Admission: EM | Admit: 2012-03-09 | Discharge: 2012-03-09 | Disposition: A | Discharge status: Home / Self Care | Payer: Medicaid Other | Attending: Emergency Medicine | Admitting: Emergency Medicine

## 2012-03-09 ENCOUNTER — Emergency Department (HOSPITAL_COMMUNITY): Payer: Medicaid Other

## 2012-03-09 DIAGNOSIS — I10 Essential (primary) hypertension: Secondary | ICD-10-CM | POA: Insufficient documentation

## 2012-03-09 DIAGNOSIS — R112 Nausea with vomiting, unspecified: Secondary | ICD-10-CM | POA: Insufficient documentation

## 2012-03-09 DIAGNOSIS — Z5900 Homelessness unspecified: Secondary | ICD-10-CM | POA: Insufficient documentation

## 2012-03-09 DIAGNOSIS — L03311 Cellulitis of abdominal wall: Secondary | ICD-10-CM

## 2012-03-09 DIAGNOSIS — R197 Diarrhea, unspecified: Secondary | ICD-10-CM | POA: Insufficient documentation

## 2012-03-09 DIAGNOSIS — B182 Chronic viral hepatitis C: Secondary | ICD-10-CM | POA: Insufficient documentation

## 2012-03-09 DIAGNOSIS — L02219 Cutaneous abscess of trunk, unspecified: Secondary | ICD-10-CM | POA: Insufficient documentation

## 2012-03-09 LAB — BASIC METABOLIC PANEL
CO2: 21 mEq/L (ref 19–32)
Calcium: 8.6 mg/dL (ref 8.4–10.5)
Potassium: 3.6 mEq/L (ref 3.5–5.1)
Sodium: 133 mEq/L — ABNORMAL LOW (ref 135–145)

## 2012-03-09 LAB — CBC
Hemoglobin: 10.9 g/dL — ABNORMAL LOW (ref 13.0–17.0)
MCH: 29.2 pg (ref 26.0–34.0)
Platelets: 84 10*3/uL — ABNORMAL LOW (ref 150–400)
RBC: 3.73 MIL/uL — ABNORMAL LOW (ref 4.22–5.81)
WBC: 5 10*3/uL (ref 4.0–10.5)

## 2012-03-09 MED ORDER — LORAZEPAM 2 MG/ML IJ SOLN
INTRAMUSCULAR | Status: AC
  Filled 2012-03-09: qty 1

## 2012-03-09 MED ORDER — IOHEXOL 300 MG/ML  SOLN
100.0000 mL | Freq: Once | INTRAMUSCULAR | Status: AC | PRN
  Administered 2012-03-09: 100 mL via INTRAVENOUS

## 2012-03-09 MED ORDER — SULFAMETHOXAZOLE-TRIMETHOPRIM 800-160 MG PO TABS: 1.0000 | ORAL_TABLET | Freq: Two times a day (BID) | ORAL | Status: DC

## 2012-03-09 MED ORDER — SODIUM CHLORIDE 0.9 % IV SOLN
Freq: Once | INTRAVENOUS | Status: AC
  Administered 2012-03-09: 16:00:00 via INTRAVENOUS

## 2012-03-09 MED ORDER — ONDANSETRON HCL 4 MG/2ML IJ SOLN
4.0000 mg | Freq: Once | INTRAMUSCULAR | Status: AC
  Administered 2012-03-09: 4 mg via INTRAVENOUS
  Filled 2012-03-09: qty 2

## 2012-03-09 MED ORDER — DEXTROSE 5 % IV SOLN: 1.0000 g | Freq: Once | INTRAVENOUS | Status: DC

## 2012-03-09 MED ORDER — MORPHINE SULFATE 4 MG/ML IJ SOLN
4.0000 mg | Freq: Once | INTRAMUSCULAR | Status: AC
  Administered 2012-03-09: 4 mg via INTRAVENOUS
  Filled 2012-03-09: qty 1

## 2012-03-09 MED ORDER — LORAZEPAM 2 MG/ML IJ SOLN
2.0000 mg | Freq: Once | INTRAMUSCULAR | Status: AC
  Administered 2012-03-09: 2 mg via INTRAVENOUS

## 2012-03-09 MED ORDER — PIPERACILLIN-TAZOBACTAM 3.375 G IVPB
3.3750 g | Freq: Once | INTRAVENOUS | Status: AC
  Administered 2012-03-09: 3.375 g via INTRAVENOUS
  Filled 2012-03-09: qty 50

## 2012-03-09 MED ORDER — VANCOMYCIN HCL IN DEXTROSE 1-5 GM/200ML-% IV SOLN
1000.0000 mg | Freq: Once | INTRAVENOUS | Status: AC
  Administered 2012-03-09: 1000 mg via INTRAVENOUS
  Filled 2012-03-09: qty 200

## 2012-03-09 NOTE — ED Notes (Signed)
MD at bedside. 

## 2012-03-09 NOTE — ED Notes (Signed)
Pt states "I want this wound on my belly looked at, it's been looking bad for 5 days."

## 2012-03-09 NOTE — ED Provider Notes (Signed)
Medical screening examination/treatment/procedure(s) were conducted as a shared visit with non-physician practitioner(s) and myself.  I personally evaluated the patient during the encounter. Patient with probable cellulitis of his surgical site. No abscess on CT. Patient was seen in the ER by surgery. He recommended discharge with oral Avelox. Patient states she will not fill them and refused assistance. He was discharged  Juliet Rude. Rubin Payor, MD 03/09/12 2322

## 2012-03-09 NOTE — Progress Notes (Signed)
Pt listed as self pay with no insurance coverage Pt confirms he is self pay [place] resident CM and [facility] community liaison spoke with him Pt offered [facility] services to assist with finding a [place] self pay provider Pt accepted information   

## 2012-03-09 NOTE — ED Notes (Signed)
Pt has large surgical wound in abdomen with redness around wound and yellow purulent drainage.

## 2012-03-09 NOTE — Progress Notes (Signed)
Noted CM consult to assisted with medications.  ED CM contacted Toniann Fail in [place] pharmacy to confirm pt is NOT eligible for [facility] indigent medication program Pt has already been offered annual assistance Pt was offered self pay resources earlier on 03/09/12 but states he has tried them all He was encouraged to return to [facility] for assistance

## 2012-03-09 NOTE — Discharge Instructions (Signed)
Skin Infections A skin infection usually develops as a result of disruption of the skin barrier.  CAUSES  A skin infection might occur following:  Trauma or an injury to the skin such as a cut or insect sting.   Inflammation (as in eczema).   Breaks in the skin between the toes (as in athlete's foot).   Swelling (edema).  SYMPTOMS  The legs are the most common site affected. Usually there is:  Redness.   Swelling.   Pain.   There may be red streaks in the area of the infection.  TREATMENT   Minor skin infections may be treated with topical antibiotics, but if the skin infection is severe, hospital care and intravenous (IV) antibiotic treatment may be needed.   Most often skin infections can be treated with oral antibiotic medicine as well as proper rest and elevation of the affected area until the infection improves.   If you are prescribed oral antibiotics, it is important to take them as directed and to take all the pills even if you feel better before you have finished all of the medicine.   You may apply warm compresses to the area for 20-30 minutes 4 times daily.  You might need a tetanus shot now if:  You have no idea when you had the last one.   You have never had a tetanus shot before.   Your wound had dirt in it.  If you need a tetanus shot and you decide not to get one, there is a rare chance of getting tetanus. Sickness from tetanus can be serious. If you get a tetanus shot, your arm may swell and become red and warm at the shot site. This is common and not a problem. SEEK MEDICAL CARE IF:  The pain and swelling from your infection do not improve within 2 days.  SEEK IMMEDIATE MEDICAL CARE IF:  You develop a fever, chills, or other serious problems.  Document Released: 11/10/2004 Document Revised: 09/22/2011 Document Reviewed: 09/22/2008 [employer] Patient Information 2012 [employer]. 
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
## 2012-03-09 NOTE — Progress Notes (Signed)
ED CM visited pt to inform him he is not eligible for [facility] indigent services.  Pt voices his concerns about possible admission and abdominal infection Pt reports he "do not care about any medications at this time" and would "not get medications filled" if offered.  Refused pt medication assistance programs

## 2012-03-09 NOTE — ED Provider Notes (Signed)
2:50 PM Medical screening examination and initial evaluation completed.  Patient is status post exploratory laparotomy and presents now with worsening drainage and redness of his abdomen concerning for postoperative wound infection. Labs ordered. Pain treated  Remainder of medical screening examination to be completed by another provider  Lyanne Co, MD 03/09/12 1451

## 2012-03-09 NOTE — ED Provider Notes (Signed)
  Physical Exam  BP 166/81  Pulse 68  Temp(Src) 98.5 F (36.9 C) (Oral)  Resp 17  Wt 168 lb 3.2 oz (76.295 kg)  SpO2 100%  Physical Exam  ED Course  Procedures  MDM Patient has cellulitis of previous surgical wound of his abdomen. CT does not show an abscess. Lab work is reassuring. Patient met with care management and refused all help with any medications or find resources. He does not appear to need surgery at this time. Patient was discharged with a prescription, although he states he will not fill it. He was given the trauma clinic followup.   Juliet Rude. Rubin Payor, MD 03/09/12 2137

## 2012-03-13 ENCOUNTER — Encounter (HOSPITAL_COMMUNITY): Payer: Self-pay | Admitting: Emergency Medicine

## 2012-03-13 ENCOUNTER — Emergency Department (HOSPITAL_COMMUNITY)
Admission: EM | Admit: 2012-03-13 | Discharge: 2012-03-15 | Discharge status: Against Medical Advice | Payer: Medicaid Other | Attending: Emergency Medicine | Admitting: Emergency Medicine

## 2012-03-13 DIAGNOSIS — Z8619 Personal history of other infectious and parasitic diseases: Secondary | ICD-10-CM | POA: Insufficient documentation

## 2012-03-13 DIAGNOSIS — F329 Major depressive disorder, single episode, unspecified: Secondary | ICD-10-CM | POA: Insufficient documentation

## 2012-03-13 DIAGNOSIS — R112 Nausea with vomiting, unspecified: Secondary | ICD-10-CM | POA: Insufficient documentation

## 2012-03-13 DIAGNOSIS — I10 Essential (primary) hypertension: Secondary | ICD-10-CM | POA: Insufficient documentation

## 2012-03-13 DIAGNOSIS — S31109A Unspecified open wound of abdominal wall, unspecified quadrant without penetration into peritoneal cavity, initial encounter: Secondary | ICD-10-CM | POA: Insufficient documentation

## 2012-03-13 DIAGNOSIS — X789XXA Intentional self-harm by unspecified sharp object, initial encounter: Secondary | ICD-10-CM | POA: Insufficient documentation

## 2012-03-13 DIAGNOSIS — F1010 Alcohol abuse, uncomplicated: Secondary | ICD-10-CM | POA: Insufficient documentation

## 2012-03-13 DIAGNOSIS — R066 Hiccough: Secondary | ICD-10-CM | POA: Insufficient documentation

## 2012-03-13 DIAGNOSIS — K7460 Unspecified cirrhosis of liver: Secondary | ICD-10-CM | POA: Insufficient documentation

## 2012-03-13 DIAGNOSIS — F3289 Other specified depressive episodes: Secondary | ICD-10-CM | POA: Insufficient documentation

## 2012-03-13 DIAGNOSIS — T148XXD Other injury of unspecified body region, subsequent encounter: Secondary | ICD-10-CM

## 2012-03-13 LAB — DIFFERENTIAL
Basophils Absolute: 0 10*3/uL (ref 0.0–0.1)
Lymphocytes Relative: 37 % (ref 12–46)
Monocytes Absolute: 0.6 10*3/uL (ref 0.1–1.0)
Monocytes Relative: 14 % — ABNORMAL HIGH (ref 3–12)
Neutro Abs: 1.9 10*3/uL (ref 1.7–7.7)
Neutrophils Relative %: 46 % (ref 43–77)

## 2012-03-13 LAB — RAPID URINE DRUG SCREEN, HOSP PERFORMED
Amphetamines: NOT DETECTED
Benzodiazepines: NOT DETECTED
Opiates: NOT DETECTED
Tetrahydrocannabinol: NOT DETECTED

## 2012-03-13 LAB — HEPATIC FUNCTION PANEL
ALT: 16 U/L (ref 0–53)
Alkaline Phosphatase: 53 U/L (ref 39–117)
Bilirubin, Direct: 0.4 mg/dL — ABNORMAL HIGH (ref 0.0–0.3)
Indirect Bilirubin: 0.8 mg/dL (ref 0.3–0.9)
Total Bilirubin: 1.2 mg/dL (ref 0.3–1.2)
Total Protein: 8.2 g/dL (ref 6.0–8.3)

## 2012-03-13 LAB — CBC
MCH: 29.5 pg (ref 26.0–34.0)
MCHC: 33.8 g/dL (ref 30.0–36.0)
MCV: 87.2 fL (ref 78.0–100.0)
Platelets: 116 10*3/uL — ABNORMAL LOW (ref 150–400)
RBC: 3.83 MIL/uL — ABNORMAL LOW (ref 4.22–5.81)

## 2012-03-13 LAB — BASIC METABOLIC PANEL
CO2: 24 mEq/L (ref 19–32)
Chloride: 105 mEq/L (ref 96–112)
Creatinine, Ser: 0.42 mg/dL — ABNORMAL LOW (ref 0.50–1.35)
Potassium: 3.4 mEq/L — ABNORMAL LOW (ref 3.5–5.1)
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
## 2012-03-13 MED ORDER — GI COCKTAIL ~~LOC~~
30.0000 mL | Freq: Once | ORAL | Status: AC
  Administered 2012-03-13: 30 mL via ORAL
  Filled 2012-03-13: qty 30

## 2012-03-13 MED ORDER — LORAZEPAM 1 MG PO TABS
1.0000 mg | ORAL_TABLET | Freq: Three times a day (TID) | ORAL | Status: DC | PRN
  Administered 2012-03-13 – 2012-03-14 (×2): 1 mg via ORAL
  Filled 2012-03-13 (×2): qty 1

## 2012-03-13 MED ORDER — BACITRACIN ZINC 500 UNIT/GM EX OINT
1.0000 "application " | TOPICAL_OINTMENT | Freq: Two times a day (BID) | CUTANEOUS | Status: DC
  Administered 2012-03-14 – 2012-03-15 (×3): 1 via TOPICAL
  Filled 2012-03-13: qty 15

## 2012-03-13 MED ORDER — HYDROCODONE-ACETAMINOPHEN 5-325 MG PO TABS
1.0000 | ORAL_TABLET | Freq: Once | ORAL | Status: AC
  Administered 2012-03-13: 1 via ORAL
  Filled 2012-03-13: qty 1

## 2012-03-13 MED ORDER — IBUPROFEN 600 MG PO TABS
600.0000 mg | ORAL_TABLET | Freq: Three times a day (TID) | ORAL | Status: DC | PRN
  Administered 2012-03-14: 600 mg via ORAL
  Filled 2012-03-13: qty 1

## 2012-03-13 MED ORDER — ALUM & MAG HYDROXIDE-SIMETH 200-200-20 MG/5ML PO SUSP
30.0000 mL | ORAL | Status: DC | PRN
  Administered 2012-03-15: 30 mL via ORAL
  Filled 2012-03-13: qty 30

## 2012-03-13 MED ORDER — NICOTINE 21 MG/24HR TD PT24
21.0000 mg | MEDICATED_PATCH | Freq: Every day | TRANSDERMAL | Status: DC
  Filled 2012-03-13: qty 1

## 2012-03-13 NOTE — Progress Notes (Signed)
Pt seen by ED CM on 03/09/12 and 03/13/12 continues to remain not a candidate for [facility] indigent medication assistance program.  Pt has stated he would not fill rx if provided by EDP.

## 2012-03-13 NOTE — BH Assessment (Addendum)
Assessment Note   [name] is an 56 y.o. male.   Patient requests detox from alcohol.  Patient reports that the have addicted since the age of 56 years old.  Patient reports drinking ten forty bottles of beer a day.  Patient reports being a locked facility.  Patient was at [facility] two days ago with similar symptoms.   Patient reports that he thinks about dying daily.  Patient reports stabbing himself in the stomach as a mean of being heard by other people.    Patient reports a past history mental illness.   Patient reports that he wants to kill another man.  Patient did not identify who he wanted to kill.  Patient reports seeing and hearing things that are not there.  Patient reports   Therapist consulted with the ER MD regarding a Telepsy recommendation for the patient. .          Axis I: Alcohol Abuse Major Depressive Disorder  Axis II: Deferred Axis III:  Past Medical History  Diagnosis Date  . Hypertension   . Hep C w/o coma, chronic   . Depression   . Hepatitis C   . Cirrhosis   . Pancreatitis    Axis IV: educational problems, occupational problems, other psychosocial or environmental problems, problems related to social environment, problems with access to health care services and problems with primary support group Axis V: 21-30 behavior considerably influenced by delusions or hallucinations OR serious impairment in judgment, communication OR inability to function in almost all areas  Past Medical History:  Past Medical History  Diagnosis Date  . Hypertension   . Hep C w/o coma, chronic   . Depression   . Hepatitis C   . Cirrhosis   . Pancreatitis     Past Surgical History  Procedure Date  . Laparotomy 02/08/2012    Procedure: EXPLORATORY LAPAROTOMY;  Surgeon: Almond Lint, MD;  Location: [facility] OR;  Service: General;  Laterality: N/A;  exploratory laparotomy, wound exploration and repair of traumatic hernia  . Abdominal surgery   .  Hernia repair     Family History: No family history on file.  Social History:  reports that he has never smoked. He has never used smokeless tobacco. He reports that he drinks about 3 ounces of alcohol per week. He reports that he does not use illicit drugs.  Additional Social History:     CIWA: CIWA-Ar BP: 152/76 mmHg Pulse Rate: 71  COWS:    Allergies: No Known Allergies  Home Medications:  (Not in a hospital admission)  OB/GYN Status:  No LMP for male patient.  General Assessment Data Location of Assessment: [facility] ED ACT Assessment: Yes Living Arrangements: Other (Comment) Can pt return to current living arrangement?: No Admission Status: Voluntary Is patient capable of signing voluntary admission?: Yes Transfer from: Other (Comment) Referral Source: Other  Education Status Is patient currently in school?: No  Risk to self Suicidal Ideation: Yes-Currently Present Suicidal Intent: Yes-Currently Present Is patient at risk for suicide?: Yes Suicidal Plan?: No-Not Currently/Within Last 6 Months Specify Access to Suicidal Means: anything sharp What has been your use of drugs/alcohol within the last 12 months?: Beer  Previous Attempts/Gestures: No How many times?: 4  Other Self Harm Risks: none reported Triggers for Past Attempts: Family contact;Unpredictable Intentional Self Injurious Behavior: Cutting Comment - Self Injurious Behavior: Cutting  Family Suicide History: No Recent stressful life event(s): Trauma (Comment) Persecutory voices/beliefs?: Yes Depression: Yes Depression Symptoms: Tearfulness;Loss of  interest in usual pleasures;Feeling angry/irritable Substance abuse history and/or treatment for substance abuse?: Yes Suicide prevention information given to non-admitted patients: Yes  Risk to Others Homicidal Ideation: No-Not Currently/Within Last 6 Months Thoughts of Harm to Others: No Current Homicidal Intent: No Current Homicidal Plan: No Access to  Homicidal Means: No Identified Victim: none reported History of harm to others?: No Assessment of Violence: None Noted Violent Behavior Description: n/a Does patient have access to weapons?: No Criminal Charges Pending?: No Does patient have a court date: No  Psychosis Hallucinations: None noted;Auditory;Tactile;Visual Delusions: None noted  Mental Status Report Appear/Hygiene: Poor hygiene Eye Contact: Poor Motor Activity: Restlessness Speech: Slow;Slurred Level of Consciousness: Alert Mood: Empty Affect: Depressed;Blunted;Irritable;Frightened Thought Processes: Coherent;Relevant Judgement: Unimpaired Obsessive Compulsive Thoughts/Behaviors: Moderate  Cognitive Functioning Memory: Remote Impaired;Recent Impaired IQ: Average Insight: Fair Impulse Control: Fair Appetite: Fair Weight Loss: 0  Weight Gain: 0  Sleep: Decreased Total Hours of Sleep: 5  Vegetative Symptoms: None  ADLScreening [facility] Assessment Services) Patient's cognitive ability adequate to safely complete daily activities?: Yes Patient able to express need for assistance with ADLs?: Yes Independently performs ADLs?: Yes  Abuse/Neglect [facility]) Physical Abuse: Denies Verbal Abuse: Denies Sexual Abuse: Denies  Prior Inpatient Therapy Prior Inpatient Therapy: No Prior Therapy Dates: none Prior Therapy Facilty/Provider(s): none Reason for Treatment: none  Prior Outpatient Therapy Prior Outpatient Therapy: No Prior Therapy Facilty/Provider(s): none Reason for Treatment: none  ADL Screening (condition at time of admission) Patient's cognitive ability adequate to safely complete daily activities?: Yes Patient able to express need for assistance with ADLs?: Yes Independently performs ADLs?: Yes       Abuse/Neglect Assessment (Assessment to be complete while patient is alone) Physical Abuse: Denies Verbal Abuse: Denies Sexual Abuse: Denies Values / Beliefs Cultural Requests During  Hospitalization: None Spiritual Requests During Hospitalization: None     Nutrition Screen Problems chewing or swallowing foods and/or liquids: No Home Tube Feeding or Total Parenteral Nutrition (TPN): No Patient appears severely malnourished: No Pregnant or Lactating: No  Additional Information 1:1 In Past 12 Months?: No CIRT Risk: No Elopement Risk: No Does patient have medical clearance?: No  Child/Adolescent Assessment Running Away Risk: Denies Bed-Wetting: Denies  Disposition: Pending Telepsych recommendations.   On Site Evaluation by:   Reviewed with Physician:     Phillip Heal LaVerne 03/13/2012 11:33 PM

## 2012-03-13 NOTE — ED Notes (Signed)
Pt states that he was here a few days ago and is having abd pain n/v some bloody emesis, and also wants to have medical clearence for mental health states that he wants detox.

## 2012-03-13 NOTE — ED Notes (Signed)
Patients has five 1 dollar bills, two 5 dollar bills, and an ID card in a black wallet. And one grey in color pocket knife. All items are in the black bag.

## 2012-03-13 NOTE — ED Notes (Signed)
Pt assisted to room 38 in Psych ED accompanied by Morrie Sheldon, ED NT with 2 bags of personal belongings, condition stable at this time.

## 2012-03-13 NOTE — ED Provider Notes (Signed)
History     CSN: [id number]  Arrival date & time 03/13/12  1345   First MD Initiated Contact with Patient 03/13/12 1504      Chief Complaint  Patient presents with  . Abdominal Pain  . Medical Clearance    HPI Patient presents to the emergency room with complaints of persistent pain in his abdomen. He has a history of a self-inflicted abdominal wound status post laparotomy on April 24. He developed a traumatic hernia as a result of this wound.  Patient states she's having persistent drainage from the wound.  He was seen in the emergency room on may 24th and had an abdominal CT scan that did not show any evidence of abscess.  Patient was given a prescription for medications and was supposed to followup with the trauma surgery office. Patient states he has not called them. He has been having trouble with nausea and vomiting. He states he noticed some blood in his emesis.  The pain is mild to moderate. Nothing seems to making it better or worse. Patient does have a history of chronic alcohol abuse as well as the Past Medical History  Diagnosis Date  . Hypertension   . Hep C w/o coma, chronic   . Depression   . Hepatitis C   . Cirrhosis   . Pancreatitis     Past Surgical History  Procedure Date  . Laparotomy 02/08/2012    Procedure: EXPLORATORY LAPAROTOMY;  Surgeon: Almond Lint, MD;  Location: [facility] OR;  Service: General;  Laterality: N/A;  exploratory laparotomy, wound exploration and repair of traumatic hernia  . Abdominal surgery   . Hernia repair     No family history on file.  History  Substance Use Topics  . Smoking status: Never Smoker   . Smokeless tobacco: Never Used  . Alcohol Use: 3.0 oz/week    20 Cans of beer per week     40 oz      Review of Systems  Gastrointestinal: Positive for vomiting and abdominal pain. Negative for blood in stool and abdominal distention.  Psychiatric/Behavioral: Negative for suicidal ideas.  All other systems reviewed and are  negative.    Allergies  Review of patient's allergies indicates no known allergies.  Home Medications  No current outpatient prescriptions on file.  BP 145/77  Pulse 77  Temp(Src) 98.5 F (36.9 C) (Oral)  Resp 16  SpO2 100%  Physical Exam  Nursing note and vitals reviewed. Constitutional: He appears well-developed and well-nourished. No distress.  HENT:  Head: Normocephalic and atraumatic.  Right Ear: External ear normal.  Left Ear: External ear normal.  Eyes: Conjunctivae are normal. Right eye exhibits no discharge. Left eye exhibits no discharge. No scleral icterus.  Neck: Neck supple. No tracheal deviation present.  Cardiovascular: Normal rate, regular rhythm and intact distal pulses.   Pulmonary/Chest: Effort normal and breath sounds normal. No stridor. No respiratory distress. He has no wheezes. He has no rales.       Frequent hiccup  Abdominal: Soft. Bowel sounds are normal. He exhibits no distension. There is no tenderness. There is no rebound and no guarding.       Healing surgical scars on the abdominal wall, no purulent drainage, serous fluid drainage from the wound, no erythema about the incision sites  Musculoskeletal: He exhibits no edema and no tenderness.  Neurological: He is alert. He has normal strength. No sensory deficit. Cranial nerve deficit:  no gross defecits noted. He exhibits normal muscle tone. He displays  no seizure activity. Coordination normal.  Skin: Skin is warm and dry. No rash noted.  Psychiatric: He has a normal mood and affect.    ED Course  Procedures (including critical care time)  Medications  gi cocktail (Maalox,Lidocaine,Donnatal) (30 mL Oral Given 03/13/12 1630)  HYDROcodone-acetaminophen (NORCO) 5-325 MG per tablet 1 tablet (1 tablet Oral Given 03/13/12 1630)    Labs Reviewed  CBC - Abnormal; Notable for the following:    RBC 3.83 (*)    Hemoglobin 11.3 (*)    HCT 33.4 (*)    Platelets 116 (*) RESULT CHECKED   All other  components within normal limits  DIFFERENTIAL - Abnormal; Notable for the following:    Monocytes Relative 14 (*)    All other components within normal limits  BASIC METABOLIC PANEL - Abnormal; Notable for the following:    Potassium 3.4 (*)    BUN <3 (*)    Creatinine, Ser 0.42 (*)    All other components within normal limits  ETHANOL - Abnormal; Notable for the following:    Alcohol, Ethyl (B) 271 (*)    All other components within normal limits  HEPATIC FUNCTION PANEL - Abnormal; Notable for the following:    Albumin 2.9 (*)    AST 40 (*)    Bilirubin, Direct 0.4 (*)    All other components within normal limits  URINE RAPID DRUG SCREEN (HOSP PERFORMED)   No results found.    MDM  The patient's self-inflicted abdominal wounds. He slowly healing. He is having drainage from the wounds but I see no evidence of acute cellulitis. An abdominal CT scan just a few days ago that did not show any signs of abscess. Do not feel there is any acute emergency medical condition associated with his wounds and abdominal pain today. I did explain to the patient that his recurrent alcohol abuse may be contributing to his burping and abdominal discomfort. Patient does states he is feeling suicidal and homicidal. I have offered act evaluation.        Celene Kras, MD 03/13/12 [phone]

## 2012-03-14 MED ORDER — LORAZEPAM 1 MG PO TABS
2.0000 mg | ORAL_TABLET | Freq: Three times a day (TID) | ORAL | Status: DC
  Administered 2012-03-14 – 2012-03-15 (×5): 2 mg via ORAL
  Filled 2012-03-14 (×5): qty 2

## 2012-03-14 MED ORDER — CITALOPRAM HYDROBROMIDE 20 MG PO TABS
20.0000 mg | ORAL_TABLET | Freq: Every morning | ORAL | Status: DC
  Administered 2012-03-14 – 2012-03-15 (×2): 20 mg via ORAL
  Filled 2012-03-14 (×2): qty 1

## 2012-03-14 NOTE — BHH Counselor (Signed)
Completed telepsych paperwork and called in request for telepsych.

## 2012-03-14 NOTE — ED Notes (Signed)
Pt is currently under review at [facility] and [facility]. Pt information was sent to [facility], however per Rosey Bath, there are no more sponsorship funds available for self pay individuals. Pt continues to pend disposition.

## 2012-03-15 DIAGNOSIS — F10229 Alcohol dependence with intoxication, unspecified: Secondary | ICD-10-CM

## 2012-03-15 DIAGNOSIS — F10988 Alcohol use, unspecified with other alcohol-induced disorder: Secondary | ICD-10-CM

## 2012-03-15 NOTE — Consult Note (Signed)
Reason for Consult: Alcohol intoxication and alcohol dependence Referring Physician: Dr. [name] is an 56 y.o. male.  HPI: Patient was seen and chart reviewed. Patient was known alcoholic and dependent came to the [facility] long emergency department for medical clearance for detox treatment. [place]. Patient has been drinking beer. Patient denies drug of abuse. Patient stated he is having bad thoughts since his wife died 2 years ago with colon cancer. Patient has no children's patient does not have a medication management or treatment. Patient reported he has been suffering with stomach sickness, jitters and have have a mild confusion. Patient denied current suicidal ideation, homicidal ideations, intentions or plans. Patient reportedly requesting rehabilitation services after detox at the state facility in [place]. Patient has a previous rehabilitation services set at [facility] but not and also in day [place]. Patient stated he does not want to 2 day [facility] because staff over there were very strict.  Mental status: Patient was calm and cooperative during this evaluation. Patient reported mood is fine and depressed on and off. His affect was constricted he has normal rate, rhythm and volume of speech. Patient has denied suicidal, homicidal ideation, intentions and plans. Patient has no evidence of for acute psychosis, delusions or paranoia. Patient has fair to poor insight, judgment and impulse control.  Past Medical History  Diagnosis Date  . Hypertension   . Hep C w/o coma, chronic   . Depression   . Hepatitis C   . Cirrhosis   . Pancreatitis     Past Surgical History  Procedure Date  . Laparotomy 02/08/2012    Procedure: EXPLORATORY LAPAROTOMY;  Surgeon: Almond Lint, MD;  Location: [facility] OR;  Service: General;  Laterality: N/A;  exploratory laparotomy, wound exploration and repair of traumatic hernia  . Abdominal surgery   .  Hernia repair     No family history on file.  Social History:  reports that he has never smoked. He has never used smokeless tobacco. He reports that he drinks about 3 ounces of alcohol per week. He reports that he does not use illicit drugs.  Allergies: No Known Allergies  Medications: I have reviewed the patient's current medications.  No results found for this or any previous visit (from the past 48 hour(s)).  No results found.  No psychosis and Positive for anorexia, anxiety, bad mood, depression, excessive alcohol consumption, illegal drug usage and Homelessness Blood pressure 163/96, pulse 89, temperature 99.2 F (37.3 C), temperature source Oral, resp. rate 14, SpO2 97.00%.   Assessment/Plan: Alcohol dependence and alcohol intoxication Substance-induced mood disorder   Recommended alcohol detox treatment and possibly rehabilitation treatment. Continue his current treatment plan without any medication changes.  [name],[name]. 03/15/2012, 5:04 PM

## 2012-03-15 NOTE — ED Notes (Signed)
Pt c/o upset stomach; informed pt will look to see what meds available, pt also requesting ginger ale.

## 2012-03-15 NOTE — BHH Counselor (Signed)
Patient evaluated by psychiatrist (Dr. Elsie Saas) and he recommends substance detox/rehab. Writer discussed recommendations with patient ([facility], [facility], [facility], [facility], etc.). Patient sts, "I don't like [facility] and I don't want to go their". He then goes on to explain his dislikes with the other suggested facilities such as how he is treated by staff, feels they don't keep him long enough suggesting that 3-4 days of detox is not sufficient, and facilities don't provide him with the appropriate transportation back to his residence. Patient begins to insist that he must go to a place that will house him for 90 days such as [facility] or [facility]. Patient then explains that he must receive treatment at a facility that has a psychiatrist only. Writer explained to patient that he is cleared from a acute/crises hospitalization needs according to the psychiatrist. Patient told that the referral could be made to [facility] and he would be notified when his bed is available or he could call daily to check on his bed status. Patient became upset stating that he wanted to be "housed" in the ED until he went to [facility] for 90 days. Patient requested to leave ED AMA stating that he no longer wanted to go to [facility]. Patient also stated he didn't want any help and only wanted to go home. Writer explained conversation that this Clinical research associate had with patient to the psychiatrist. He suggested letting patient stay for further observation and he would  re-evaluated patient tomorrow as het was stating that he "felt kinda suicidal sometimes", however denied current suicidal thoughts.    Dr. Henrene Hawking agreed to discharge patient home, per pt's request. Writer informed EDP (Dr. Prudencio Burly) of the psychiatrist's recommendations. Patient discharged with referrals to mental health therapist, psychiatrist, mobile crises, etc.

## 2012-03-15 NOTE — ED Notes (Signed)
Pt refused all paperwork from [place], Act; pt escorted to cashier, clothes to be returned @ front by [employer].

## 2012-03-15 NOTE — ED Notes (Signed)
Old adhesive removed from abdomen, pt without complaint.

## 2012-03-17 ENCOUNTER — Emergency Department (HOSPITAL_COMMUNITY)
Admission: EM | Admit: 2012-03-17 | Discharge: 2012-03-20 | Disposition: A | Discharge status: Other Acute Inpatient Hospital | Payer: Self-pay | Attending: Emergency Medicine | Admitting: Emergency Medicine

## 2012-03-17 ENCOUNTER — Emergency Department (HOSPITAL_COMMUNITY): Payer: Self-pay

## 2012-03-17 ENCOUNTER — Encounter (INDEPENDENT_AMBULATORY_CARE_PROVIDER_SITE_OTHER): Payer: Self-pay | Admitting: General Surgery

## 2012-03-17 ENCOUNTER — Encounter (HOSPITAL_COMMUNITY): Payer: Self-pay | Admitting: Emergency Medicine

## 2012-03-17 DIAGNOSIS — S31109A Unspecified open wound of abdominal wall, unspecified quadrant without penetration into peritoneal cavity, initial encounter: Secondary | ICD-10-CM | POA: Insufficient documentation

## 2012-03-17 DIAGNOSIS — R109 Unspecified abdominal pain: Secondary | ICD-10-CM | POA: Insufficient documentation

## 2012-03-17 DIAGNOSIS — S31119A Laceration without foreign body of abdominal wall, unspecified quadrant without penetration into peritoneal cavity, initial encounter: Secondary | ICD-10-CM

## 2012-03-17 DIAGNOSIS — X789XXA Intentional self-harm by unspecified sharp object, initial encounter: Secondary | ICD-10-CM | POA: Insufficient documentation

## 2012-03-17 DIAGNOSIS — F329 Major depressive disorder, single episode, unspecified: Secondary | ICD-10-CM | POA: Insufficient documentation

## 2012-03-17 DIAGNOSIS — F3289 Other specified depressive episodes: Secondary | ICD-10-CM | POA: Insufficient documentation

## 2012-03-17 DIAGNOSIS — Z7289 Other problems related to lifestyle: Secondary | ICD-10-CM

## 2012-03-17 LAB — URINALYSIS, MICROSCOPIC ONLY
Bilirubin Urine: NEGATIVE
Nitrite: NEGATIVE
Specific Gravity, Urine: 1.006 (ref 1.005–1.030)
pH: 5.5 (ref 5.0–8.0)

## 2012-03-17 LAB — CBC
HCT: 32.5 % — ABNORMAL LOW (ref 39.0–52.0)
MCV: 86.9 fL (ref 78.0–100.0)
Platelets: 145 10*3/uL — ABNORMAL LOW (ref 150–400)
RBC: 3.74 MIL/uL — ABNORMAL LOW (ref 4.22–5.81)
WBC: 7.4 10*3/uL (ref 4.0–10.5)

## 2012-03-17 LAB — POCT I-STAT, CHEM 8
BUN: 5 mg/dL — ABNORMAL LOW (ref 6–23)
Calcium, Ion: 1.09 mmol/L — ABNORMAL LOW (ref 1.12–1.32)
Chloride: 99 mEq/L (ref 96–112)
Creatinine, Ser: 0.7 mg/dL (ref 0.50–1.35)
Glucose, Bld: 85 mg/dL (ref 70–99)
HCT: 36 % — ABNORMAL LOW (ref 39.0–52.0)

## 2012-03-17 LAB — RAPID URINE DRUG SCREEN, HOSP PERFORMED
Amphetamines: NOT DETECTED
Barbiturates: NOT DETECTED
Benzodiazepines: NOT DETECTED
Tetrahydrocannabinol: NOT DETECTED

## 2012-03-17 LAB — COMPREHENSIVE METABOLIC PANEL
Albumin: 3 g/dL — ABNORMAL LOW (ref 3.5–5.2)
BUN: 6 mg/dL (ref 6–23)
Calcium: 8.5 mg/dL (ref 8.4–10.5)
Creatinine, Ser: 0.44 mg/dL — ABNORMAL LOW (ref 0.50–1.35)
Total Protein: 8.1 g/dL (ref 6.0–8.3)

## 2012-03-17 LAB — ACETAMINOPHEN LEVEL: Acetaminophen (Tylenol), Serum: <15 ug/mL (ref 10–30)

## 2012-03-17 LAB — LACTIC ACID, PLASMA: Lactic Acid, Venous: 1.8 mmol/L (ref 0.5–2.2)

## 2012-03-17 LAB — PROTIME-INR: Prothrombin Time: 15.5 seconds — ABNORMAL HIGH (ref 11.6–15.2)

## 2012-03-17 MED ORDER — IOHEXOL 300 MG/ML  SOLN
100.0000 mL | Freq: Once | INTRAMUSCULAR | Status: AC | PRN
  Administered 2012-03-17: 100 mL via INTRAVENOUS

## 2012-03-17 MED ORDER — OXYCODONE HCL 5 MG PO TABS
5.0000 mg | ORAL_TABLET | ORAL | Status: AC
  Administered 2012-03-17: 5 mg via ORAL
  Filled 2012-03-17: qty 1

## 2012-03-17 MED ORDER — SODIUM CHLORIDE 0.9 % IV SOLN
Freq: Once | INTRAVENOUS | Status: AC
  Administered 2012-03-17: 17:00:00 via INTRAVENOUS

## 2012-03-17 MED ORDER — VITAMIN B-1 100 MG PO TABS
100.0000 mg | ORAL_TABLET | Freq: Every day | ORAL | Status: DC
  Administered 2012-03-17 – 2012-03-18 (×2): 100 mg via ORAL
  Filled 2012-03-17 (×3): qty 1

## 2012-03-17 MED ORDER — LORAZEPAM 1 MG PO TABS
1.0000 mg | ORAL_TABLET | Freq: Three times a day (TID) | ORAL | Status: DC | PRN
  Administered 2012-03-17 (×2): 1 mg via ORAL
  Filled 2012-03-17 (×2): qty 1

## 2012-03-17 MED ORDER — ADULT MULTIVITAMIN W/MINERALS CH
1.0000 | ORAL_TABLET | Freq: Every day | ORAL | Status: DC
  Administered 2012-03-18: 1 via ORAL
  Filled 2012-03-17 (×2): qty 1

## 2012-03-17 MED ORDER — THIAMINE HCL 100 MG/ML IJ SOLN: 100.0000 mg | Freq: Every day | INTRAMUSCULAR | Status: DC

## 2012-03-17 MED ORDER — MORPHINE SULFATE 2 MG/ML IJ SOLN
INTRAMUSCULAR | Status: AC
  Administered 2012-03-17: 2 mg via INTRAVENOUS
  Filled 2012-03-17: qty 1

## 2012-03-17 MED ORDER — FOLIC ACID 1 MG PO TABS
1.0000 mg | ORAL_TABLET | Freq: Every day | ORAL | Status: DC
  Administered 2012-03-17 – 2012-03-18 (×2): 1 mg via ORAL
  Filled 2012-03-17 (×3): qty 1

## 2012-03-17 MED ORDER — LORAZEPAM 2 MG/ML IJ SOLN: 1.0000 mg | Freq: Four times a day (QID) | INTRAMUSCULAR | Status: DC | PRN

## 2012-03-17 MED ORDER — ZOLPIDEM TARTRATE 5 MG PO TABS
10.0000 mg | ORAL_TABLET | Freq: Every evening | ORAL | Status: DC | PRN
  Administered 2012-03-17 – 2012-03-18 (×2): 10 mg via ORAL
  Filled 2012-03-17 (×2): qty 2

## 2012-03-17 MED ORDER — ONDANSETRON HCL 8 MG PO TABS
4.0000 mg | ORAL_TABLET | Freq: Three times a day (TID) | ORAL | Status: DC | PRN
  Administered 2012-03-17: 4 mg via ORAL
  Filled 2012-03-17: qty 2

## 2012-03-17 MED ORDER — LORAZEPAM 2 MG/ML IJ SOLN
INTRAMUSCULAR | Status: AC
  Administered 2012-03-17: 1 mg
  Filled 2012-03-17: qty 1

## 2012-03-17 MED ORDER — LORAZEPAM 1 MG PO TABS
1.0000 mg | ORAL_TABLET | Freq: Four times a day (QID) | ORAL | Status: DC | PRN
  Administered 2012-03-18: 1 mg via ORAL
  Filled 2012-03-17 (×5): qty 1

## 2012-03-17 MED ORDER — MORPHINE SULFATE 4 MG/ML IJ SOLN
4.0000 mg | Freq: Once | INTRAMUSCULAR | Status: AC
  Administered 2012-03-17: 4 mg via INTRAVENOUS
  Filled 2012-03-17: qty 1
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
## 2012-03-17 NOTE — ED Notes (Signed)
Patient's wallet has been locked up by security and the paper work and key for locker where wallet is being kelt is in patients boot. Patient advised.
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
## 2012-03-17 NOTE — ED Notes (Signed)
First contact with pt dressing at midline abdomen dressing in place no drainage noted. Pt denies any acute pain. Sitter at bedside. Pt alert and follows commands.

## 2012-03-18 MED ORDER — FOLIC ACID 1 MG PO TABS
1.0000 mg | ORAL_TABLET | Freq: Every day | ORAL | Status: DC
  Administered 2012-03-19: 1 mg via ORAL

## 2012-03-18 MED ORDER — ADULT MULTIVITAMIN W/MINERALS CH
1.0000 | ORAL_TABLET | Freq: Every day | ORAL | Status: DC
  Administered 2012-03-19: 1 via ORAL
  Filled 2012-03-18: qty 1

## 2012-03-18 MED ORDER — THIAMINE HCL 100 MG/ML IJ SOLN: 100.0000 mg | Freq: Every day | INTRAMUSCULAR | Status: DC

## 2012-03-18 MED ORDER — LORAZEPAM 1 MG PO TABS
1.0000 mg | ORAL_TABLET | Freq: Four times a day (QID) | ORAL | Status: DC
  Administered 2012-03-18 – 2012-03-20 (×7): 1 mg via ORAL
  Filled 2012-03-18 (×2): qty 1

## 2012-03-18 MED ORDER — VITAMIN B-1 100 MG PO TABS
100.0000 mg | ORAL_TABLET | Freq: Every day | ORAL | Status: DC
  Administered 2012-03-19: 100 mg via ORAL

## 2012-03-18 MED ORDER — LORAZEPAM 1 MG PO TABS
1.0000 mg | ORAL_TABLET | Freq: Four times a day (QID) | ORAL | Status: DC | PRN
  Filled 2012-03-18: qty 1

## 2012-03-18 MED ORDER — LORAZEPAM 2 MG/ML IJ SOLN: 1.0000 mg | Freq: Four times a day (QID) | INTRAMUSCULAR | Status: DC | PRN
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
## 2012-03-18 NOTE — ED Notes (Signed)
IVC Papers Faxed To [employer]

## 2012-03-18 NOTE — ED Notes (Signed)
Tele Psych Called & Faxed In at 08:00

## 2012-03-18 NOTE — [facility] Counselor (Signed)
Counselor contacted [facility] for placement but no beds are available.

## 2012-03-18 NOTE — ED Notes (Signed)
Patient is resting comfortably. 

## 2012-03-18 NOTE — ED Notes (Signed)
MD at bedside. 
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
## 2012-03-18 NOTE — ED Notes (Signed)
ACT team at bedside to re-evaluate pt.

## 2012-03-18 NOTE — [facility] Counselor (Signed)
Information faxed to [facility] and confirmed that it was received.
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
## 2012-03-19 MED ORDER — IBUPROFEN 200 MG PO TABS
400.0000 mg | ORAL_TABLET | Freq: Four times a day (QID) | ORAL | Status: DC | PRN
  Administered 2012-03-19 (×2): 400 mg via ORAL
  Filled 2012-03-19 (×2): qty 2

## 2012-03-19 MED ORDER — DIPHENHYDRAMINE HCL 25 MG PO CAPS
50.0000 mg | ORAL_CAPSULE | Freq: Once | ORAL | Status: AC
  Administered 2012-03-19: 50 mg via ORAL
  Filled 2012-03-19: qty 2

## 2012-03-19 NOTE — ED Notes (Signed)
Pt.  Ate 100% of lunch, will continue to monitor, abdominal pain has decreased. Resting comfortably

## 2012-03-19 NOTE — [facility] Counselor (Signed)
1630:  Pt has now rescinded and states that "I need long-term care.  I'm not going to [facility].  I want to go straight to [facility]."  Dr and [facility] notified and [facility] screening will need to be initiated.

## 2012-03-19 NOTE — ED Notes (Signed)
Pt. Ate 100% of breakfast, Pt. Very calm and cooperative, medicated for abdominal pain from his wound.  Dressing intact , no drainage noted.   Pt. oob to the bathroom , gait steady.

## 2012-03-19 NOTE — BH Assessment (Signed)
[facility] Assessment Progress Note      1615:  Pt has been accepted to [facility].01  All paperwork completed and faxed to appropriate parties.  Dr. And nursing staff notified and agreeable with disposition.

## 2012-03-19 NOTE — ED Notes (Signed)
Pt remains sleeping audible snoring. Sitter at bedside.

## 2012-03-19 NOTE — ED Provider Notes (Signed)
Pt eating comfortably without distress this afternoon. Pt was accepted at [facility] but Pt refused and wants [facility] transfer. ACT attempting [facility] review.  Hurman Horn, MD 03/19/12 [phone]

## 2012-03-20 ENCOUNTER — Encounter (HOSPITAL_COMMUNITY): Payer: Self-pay

## 2012-03-20 ENCOUNTER — Inpatient Hospital Stay (HOSPITAL_COMMUNITY)
Admission: AD | Admit: 2012-03-20 | Discharge: 2012-03-27 | DRG: 897 | Disposition: A | Discharge status: Other Acute Inpatient Hospital | Payer: Federal, State, Local not specified - Other | Source: Ambulatory Visit | Attending: Psychiatry | Admitting: Psychiatry

## 2012-03-20 DIAGNOSIS — L02419 Cutaneous abscess of limb, unspecified: Secondary | ICD-10-CM

## 2012-03-20 DIAGNOSIS — L03119 Cellulitis of unspecified part of limb: Secondary | ICD-10-CM

## 2012-03-20 DIAGNOSIS — L408 Other psoriasis: Secondary | ICD-10-CM | POA: Diagnosis present

## 2012-03-20 DIAGNOSIS — X789XXA Intentional self-harm by unspecified sharp object, initial encounter: Secondary | ICD-10-CM | POA: Diagnosis present

## 2012-03-20 DIAGNOSIS — T148XXA Other injury of unspecified body region, initial encounter: Secondary | ICD-10-CM

## 2012-03-20 DIAGNOSIS — F602 Antisocial personality disorder: Secondary | ICD-10-CM | POA: Diagnosis present

## 2012-03-20 DIAGNOSIS — I10 Essential (primary) hypertension: Secondary | ICD-10-CM | POA: Diagnosis present

## 2012-03-20 DIAGNOSIS — R066 Hiccough: Secondary | ICD-10-CM | POA: Diagnosis present

## 2012-03-20 DIAGNOSIS — K7460 Unspecified cirrhosis of liver: Secondary | ICD-10-CM | POA: Diagnosis present

## 2012-03-20 DIAGNOSIS — F609 Personality disorder, unspecified: Secondary | ICD-10-CM

## 2012-03-20 DIAGNOSIS — L089 Local infection of the skin and subcutaneous tissue, unspecified: Secondary | ICD-10-CM

## 2012-03-20 DIAGNOSIS — F10988 Alcohol use, unspecified with other alcohol-induced disorder: Principal | ICD-10-CM | POA: Diagnosis present

## 2012-03-20 DIAGNOSIS — K219 Gastro-esophageal reflux disease without esophagitis: Secondary | ICD-10-CM | POA: Diagnosis present

## 2012-03-20 DIAGNOSIS — B1920 Unspecified viral hepatitis C without hepatic coma: Secondary | ICD-10-CM | POA: Diagnosis present

## 2012-03-20 DIAGNOSIS — E871 Hypo-osmolality and hyponatremia: Secondary | ICD-10-CM | POA: Diagnosis present

## 2012-03-20 DIAGNOSIS — D649 Anemia, unspecified: Secondary | ICD-10-CM | POA: Diagnosis present

## 2012-03-20 DIAGNOSIS — K439 Ventral hernia without obstruction or gangrene: Secondary | ICD-10-CM | POA: Diagnosis present

## 2012-03-20 DIAGNOSIS — E46 Unspecified protein-calorie malnutrition: Secondary | ICD-10-CM | POA: Diagnosis present

## 2012-03-20 DIAGNOSIS — F1994 Other psychoactive substance use, unspecified with psychoactive substance-induced mood disorder: Secondary | ICD-10-CM | POA: Diagnosis present

## 2012-03-20 DIAGNOSIS — F1020 Alcohol dependence, uncomplicated: Secondary | ICD-10-CM | POA: Diagnosis present

## 2012-03-20 DIAGNOSIS — F411 Generalized anxiety disorder: Secondary | ICD-10-CM | POA: Diagnosis present

## 2012-03-20 DIAGNOSIS — F10239 Alcohol dependence with withdrawal, unspecified: Secondary | ICD-10-CM | POA: Diagnosis present

## 2012-03-20 DIAGNOSIS — F10939 Alcohol use, unspecified with withdrawal, unspecified: Secondary | ICD-10-CM | POA: Diagnosis present

## 2012-03-20 DIAGNOSIS — T8131XA Disruption of external operation (surgical) wound, not elsewhere classified, initial encounter: Secondary | ICD-10-CM | POA: Diagnosis not present

## 2012-03-20 DIAGNOSIS — F10129 Alcohol abuse with intoxication, unspecified: Secondary | ICD-10-CM

## 2012-03-20 DIAGNOSIS — Y839 Surgical procedure, unspecified as the cause of abnormal reaction of the patient, or of later complication, without mention of misadventure at the time of the procedure: Secondary | ICD-10-CM | POA: Diagnosis not present

## 2012-03-20 DIAGNOSIS — F10229 Alcohol dependence with intoxication, unspecified: Secondary | ICD-10-CM | POA: Diagnosis present

## 2012-03-20 HISTORY — DX: Anxiety disorder, unspecified: F41.9

## 2012-03-20 MED ORDER — HYDROXYZINE HCL 25 MG PO TABS: 25.0000 mg | ORAL_TABLET | Freq: Four times a day (QID) | ORAL | Status: DC | PRN

## 2012-03-20 MED ORDER — LORAZEPAM 1 MG PO TABS
2.0000 mg | ORAL_TABLET | Freq: Three times a day (TID) | ORAL | Status: DC
  Administered 2012-03-20 – 2012-03-22 (×7): 2 mg via ORAL
  Filled 2012-03-20 (×7): qty 2

## 2012-03-20 MED ORDER — LORAZEPAM 1 MG PO TABS: 2.0000 mg | ORAL_TABLET | Freq: Three times a day (TID) | ORAL | Status: DC

## 2012-03-20 MED ORDER — SULFAMETHOXAZOLE-TMP DS 800-160 MG PO TABS
1.0000 | ORAL_TABLET | Freq: Two times a day (BID) | ORAL | Status: AC
  Administered 2012-03-20 – 2012-03-27 (×14): 1 via ORAL
  Filled 2012-03-20 (×18): qty 1

## 2012-03-20 MED ORDER — MAGNESIUM HYDROXIDE 400 MG/5ML PO SUSP: 30.0000 mL | Freq: Every day | ORAL | Status: DC | PRN

## 2012-03-20 MED ORDER — TRAZODONE HCL 100 MG PO TABS
100.0000 mg | ORAL_TABLET | Freq: Every day | ORAL | Status: DC
  Administered 2012-03-20 – 2012-03-24 (×5): 100 mg via ORAL
  Filled 2012-03-20 (×7): qty 1

## 2012-03-20 MED ORDER — NAPROXEN 500 MG PO TABS
500.0000 mg | ORAL_TABLET | Freq: Two times a day (BID) | ORAL | Status: DC
  Administered 2012-03-20 – 2012-03-27 (×15): 500 mg via ORAL
  Filled 2012-03-20 (×23): qty 1

## 2012-03-20 MED ORDER — THIAMINE HCL 100 MG/ML IJ SOLN
100.0000 mg | Freq: Once | INTRAMUSCULAR | Status: AC
  Administered 2012-03-20: 100 mg via INTRAMUSCULAR

## 2012-03-20 MED ORDER — LORAZEPAM 1 MG PO TABS
1.0000 mg | ORAL_TABLET | Freq: Four times a day (QID) | ORAL | Status: DC | PRN
  Administered 2012-03-22 – 2012-03-25 (×5): 1 mg via ORAL
  Filled 2012-03-20 (×4): qty 1

## 2012-03-20 MED ORDER — ALUM & MAG HYDROXIDE-SIMETH 200-200-20 MG/5ML PO SUSP: 30.0000 mL | ORAL | Status: DC | PRN

## 2012-03-20 MED ORDER — LOPERAMIDE HCL 2 MG PO CAPS: 2.0000 mg | ORAL_CAPSULE | ORAL | Status: DC | PRN

## 2012-03-20 MED ORDER — NICOTINE 21 MG/24HR TD PT24
21.0000 mg | MEDICATED_PATCH | Freq: Every day | TRANSDERMAL | Status: DC
  Filled 2012-03-20 (×2): qty 1

## 2012-03-20 MED ORDER — CHLORPROMAZINE HCL 50 MG PO TABS
50.0000 mg | ORAL_TABLET | Freq: Three times a day (TID) | ORAL | Status: DC
  Administered 2012-03-20 – 2012-03-23 (×10): 50 mg via ORAL
  Filled 2012-03-20 (×18): qty 1

## 2012-03-20 MED ORDER — ONDANSETRON 4 MG PO TBDP: 4.0000 mg | ORAL_TABLET | Freq: Four times a day (QID) | ORAL | Status: AC | PRN

## 2012-03-20 MED ORDER — HYDROCORTISONE 1 % EX CREA
TOPICAL_CREAM | Freq: Two times a day (BID) | CUTANEOUS | Status: DC
  Administered 2012-03-21: 1 via TOPICAL
  Administered 2012-03-22: 17:00:00 via TOPICAL
  Administered 2012-03-24 – 2012-03-27 (×6): 1 via TOPICAL
  Filled 2012-03-20: qty 28
  Filled 2012-03-20 (×2): qty 1.5

## 2012-03-20 MED ORDER — ADULT MULTIVITAMIN W/MINERALS CH
1.0000 | ORAL_TABLET | Freq: Every day | ORAL | Status: DC
  Administered 2012-03-20 – 2012-03-27 (×8): 1 via ORAL
  Filled 2012-03-20 (×10): qty 1

## 2012-03-20 MED ORDER — ACETAMINOPHEN 325 MG PO TABS: 650.0000 mg | ORAL_TABLET | Freq: Four times a day (QID) | ORAL | Status: DC | PRN

## 2012-03-20 MED ORDER — CHLORDIAZEPOXIDE HCL 25 MG PO CAPS: 50.0000 mg | ORAL_CAPSULE | Freq: Once | ORAL | Status: DC

## 2012-03-20 MED ORDER — PANTOPRAZOLE SODIUM 20 MG PO TBEC
20.0000 mg | DELAYED_RELEASE_TABLET | Freq: Every day | ORAL | Status: DC
  Administered 2012-03-20 – 2012-03-26 (×7): 20 mg via ORAL
  Filled 2012-03-20 (×9): qty 1

## 2012-03-20 MED ORDER — CHLORDIAZEPOXIDE HCL 25 MG PO CAPS: 25.0000 mg | ORAL_CAPSULE | Freq: Four times a day (QID) | ORAL | Status: DC | PRN

## 2012-03-20 MED ORDER — LORAZEPAM 2 MG/ML IJ SOLN: 1.0000 mg | Freq: Four times a day (QID) | INTRAMUSCULAR | Status: DC | PRN

## 2012-03-20 MED ORDER — VITAMIN B-1 100 MG PO TABS
100.0000 mg | ORAL_TABLET | Freq: Every day | ORAL | Status: DC
  Administered 2012-03-21 – 2012-03-27 (×7): 100 mg via ORAL
  Filled 2012-03-20 (×9): qty 1

## 2012-03-20 MED ORDER — FERROUS SULFATE 325 (65 FE) MG PO TABS
325.0000 mg | ORAL_TABLET | Freq: Two times a day (BID) | ORAL | Status: DC
  Administered 2012-03-20 – 2012-03-27 (×15): 325 mg via ORAL
  Filled 2012-03-20 (×19): qty 1

## 2012-03-20 NOTE — ED Notes (Signed)
[facility]
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
## 2012-03-20 NOTE — ED Notes (Signed)
[employer] arrived to serve IVC papers and to transport pt to [facility]. Pt's 3 bags of belongings returned to pt, plus belongings locked by Security.

## 2012-03-20 NOTE — Tx Team (Signed)
Initial Interdisciplinary Treatment Plan  PATIENT STRENGTHS: (choose at least two) Ability for insight Capable of independent living Communication skills  PATIENT STRESSORS: Financial difficulties Health problems Substance abuse   PROBLEM LIST: Problem List/Patient Goals Date to be addressed Date deferred Reason deferred Estimated date of resolution    Depression with SI/HI 6/4     ETOH abuse 6/4                                                DISCHARGE CRITERIA:  Ability to meet basic life and health needs Adequate post-discharge living arrangements Improved stabilization in mood, thinking, and/or behavior Reduction of life-threatening or endangering symptoms to within safe limits Withdrawal symptoms are absent or subacute and managed without 24-hour nursing intervention  PRELIMINARY DISCHARGE PLAN: Attend 12-step recovery group Outpatient therapy  PATIENT/FAMIILY INVOLVEMENT: This treatment plan has been presented to and reviewed with the patient, [name], and/or family member,.  The patient and family have been given the opportunity to ask questions and make suggestions.  [name] [facility] 03/20/2012, 10:24 AM

## 2012-03-20 NOTE — Treatment Plan (Signed)
Interdisciplinary Treatment Plan Update (Adult)  Date: 03/20/2012  Time Reviewed: 10:42 AM   Progress in Treatment: Attending groups: Yes Participating in groups: Yes Taking medication as prescribed: Yes Tolerating medication: Yes   Family/Significant other contact made:  No Patient understands diagnosis:  Yes  As evidenced by asking for help with chronic alcoholism and depression Discussing patient identified problems/goals with staff:  Yes  See below Medical problems stabilized or resolved:  Yes Denies suicidal/homicidal ideation: Yes No, but contracts for safety  "There is no way to kill yourself here" Issues/concerns per patient self-inventory:  Not filled out Other:  New problem(s) identified: N/A  Reason for Continuation of Hospitalization: Depression Medication stabilization Withdrawal symptoms  Interventions implemented related to continuation of hospitalization: Thorazine for hiccups  Opiates for pain Ativan for withdrawal/agitation  Try to make comfortable  Decrease agitation  Refer on for longer term services Additional comments:  Estimated length of stay: 2-3 days  Discharge Plan:Refer to [facility]  New goal(s): N/A  Review of initial/current patient goals per problem list:   1.  Goal(s): Address medical problems  Met:  No  Target date:6/5  As evidenced [id number] of hiccups, decrease pain to Emari's identified baseline level  2.  Goal (s): Get into rehab from here ASAP  Met:  No  Target date:6/7  As evidenced [id number] a bed at [facility]  3.  Goal(s): Eliminate SI  Met:  No  Target date: 6/6 As evidenced NF:AOZH report 4.  Goal(s): Decrease depression  Met:  No  Target date:6/7  As evidenced by: Harvie Heck will rate his depression at 4 or less on self inventory  Attendees: Patient: Terald Jump  03/20/2012 10:42 AM  Family:     Physician:  Lupe Carney 03/20/2012 10:42 AM   Nursing:  Roswell Miners  03/20/2012 10:42 AM   Case Manager:  Richelle Ito, LCSW 03/20/2012 10:42 AM   Counselor:  Ronda Fairly, LCSWA 03/20/2012 10:42 AM   Other:     Other:     Other:     Other:      Scribe for Treatment Team:   Ida Rogue, 03/20/2012 10:42 AM

## 2012-03-20 NOTE — Progress Notes (Signed)
[facility] Group Notes:  (Counselor/Nursing/MHT/Case Management/Adjunct)  03/21/2012 10:30 AM  Type of Therapy:  Group Therapy at 11 and 1:15 on 03/20/12  Participation Level:  Did Not Attend   Clide Dales 03/21/2012, 10:30 AM

## 2012-03-20 NOTE — Progress Notes (Signed)
Patient ID: [name], male   DOB: Apr 18, 1956, 56 y.o.   MRN: [id number] Pt isolating in room.  Refusing to go to groups.  Pt states "I am just ready to get out of here".  Pt presenting with angry affect; uncooperative.  Support and encouragement given. Pt remains safe on unit.

## 2012-03-20 NOTE — Progress Notes (Signed)
Patient ID: [name], male   DOB: 06-29-1956, 57 y.o.   MRN: [id number] Internal medicine in to assess pt.  Pt refused.
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
## 2012-03-20 NOTE — BH Assessment (Signed)
Assessment Note   [name] is an 56 y.o. male that was reassessed this day.  Pt presented to ED with SI with plan to shoot self.  Pt continues to endorse SI with plan and is unable to contract for safety.  Pt also endorses HI with plan to shoot a person that he refuses to name.  Pt endorses AVH and depression.  Pt reported he drinks 10 40 oz beers daily.  Pt stated he did not want to go to [facility].  However, Dr. Catha Brow accepted pt and per EDP Dierdre Highman, pt was made IVC and is to go to [facility].  [facility] came to ED and are transporting pt to [facility].  EDP notified as well as ED staff.  Completed support paperwork, reassessment and assessment notification and faxed to [facility] to log.  Previous Note:  [name] is an 56 y.o. male. Reassessment of pt. Pt continues to endorse SI with plan and intent to shoot self. Pt reports he can get a gun to do this. Pt continues to endorse HI with plan to shoot an unknown man that he refuses to name. Pt also reports both auditory and visual hallucinations: states he sees people and animals and hears the people talking. Pt reports he is depressed. States he drinks 10 40 oz beers daily and has been doing so for 15 years. Pt still reports current withdrawal symptoms.   Axis I: Major Depression, Recurrent severe and Alcohol Dependence Axis II: Deferred Axis III:  Past Medical History  Diagnosis Date  . Hepatitis C   . Depression   . Cirrhosis   . Pancreatitis   . Suicide attempt   . Hypertension    Axis IV: economic problems, housing problems, other psychosocial or environmental problems and problems with primary support group Axis V: 21-30 behavior considerably influenced by delusions or hallucinations OR serious impairment in judgment, communication OR inability to function in almost all areas  Past Medical History:  Past Medical History  Diagnosis Date  . Hepatitis C   . Depression   . Cirrhosis   . Pancreatitis   . Suicide attempt   . Hypertension     Past Surgical  History  Procedure Date  . Laparotomy   . Hernia repair   . Exploratory laparotomy 02/08/11    for self inflicted SW; oversew bleeding omentum    Family History: History reviewed. No pertinent family history.  Social History:  reports that he has never smoked. He does not have any smokeless tobacco history on file. He reports that he drinks alcohol. He reports that he does not use illicit drugs.  Additional Social History:  Alcohol / Drug Use Pain Medications: pt denies Prescriptions: pt denies Over the Counter: pt denies History of alcohol / drug use?: Yes Longest period of sobriety (when/how long): none recent Negative Consequences of Use: Financial;Legal Withdrawal Symptoms: Tremors Substance #1 Name of Substance 1: beer 1 - Age of First Use: 12 1 - Amount (size/oz): 10-40 oz beers 1 - Frequency: daily 1 - Duration: 15 years 1 - Last Use / Amount: 5/31, 5 40 oz beers  CIWA: CIWA-Ar BP: 118/73 mmHg Pulse Rate: 74  Nausea and Vomiting: no nausea and no vomiting Tactile Disturbances: none Tremor: not visible, but can be felt fingertip to fingertip Auditory Disturbances: not present Paroxysmal Sweats: no sweat visible Visual Disturbances: not present Anxiety: no anxiety, at ease Headache, Fullness in Head: none present Agitation: normal activity Orientation and Clouding of Sensorium: oriented and can do serial  additions CIWA-Ar Total: 1  COWS:    Allergies: No Known Allergies  Home Medications:  (Not in a hospital admission)  OB/GYN Status:  No LMP for male patient.  General Assessment Data Location of Assessment: [facility] ED ACT Assessment: Yes Living Arrangements: Other (Comment) (homeless) Can pt return to current living arrangement?: Yes Admission Status: Involuntary Is patient capable of signing voluntary admission?: Yes Transfer from: Acute Hospital Referral Source: Self/Family/Friend  Education Status Is patient currently in school?: No  Risk to  self Suicidal Ideation: Yes-Currently Present Suicidal Intent: Yes-Currently Present Is patient at risk for suicide?: Yes Suicidal Plan?: Yes-Currently Present Specify Current Suicidal Plan: shoot self Access to Means: Yes Specify Access to Suicidal Means: pt stated he can get a gun What has been your use of drugs/alcohol within the last 12 months?: current daily drinker Previous Attempts/Gestures: Yes How many times?: 2  Other Self Harm Risks: yes Triggers for Past Attempts: Other personal contacts Intentional Self Injurious Behavior: None Comment - Self Injurious Behavior: cuts self Family Suicide History: No Recent stressful life event(s): Other (Comment) (pt is homeless) Persecutory voices/beliefs?: No Depression: Yes Depression Symptoms: Despondent;Insomnia;Isolating;Fatigue;Guilt;Loss of interest in usual pleasures;Feeling worthless/self pity Substance abuse history and/or treatment for substance abuse?: Yes Suicide prevention information given to non-admitted patients: Not applicable  Risk to Others Homicidal Ideation: Yes-Currently Present Thoughts of Harm to Others: Yes-Currently Present Comment - Thoughts of Harm to Others: reports he plans to kill a man, an unknown target Current Homicidal Intent: Yes-Currently Present Current Homicidal Plan: Yes-Currently Present Describe Current Homicidal Plan: to shoot the man Access to Homicidal Means: Yes Describe Access to Homicidal Means: gun Identified Victim: will not identify History of harm to others?: Yes Assessment of Violence: In distant past Violent Behavior Description: hurt others "that made me mad" Does patient have access to weapons?: Yes (Comment) (knives available) Criminal Charges Pending?: No Does patient have a court date: No  Psychosis Hallucinations: Auditory;Visual (hears voices, sees people, animals) Delusions: None noted  Mental Status Report Appear/Hygiene: Disheveled Eye Contact: Fair Motor  Activity: Unremarkable Speech: Logical/coherent Level of Consciousness: Alert Mood: Depressed Affect: Appropriate to circumstance Anxiety Level: Minimal Thought Processes: Coherent;Relevant Judgement: Unimpaired Orientation: Person;Place;Time;Situation Obsessive Compulsive Thoughts/Behaviors: None  Cognitive Functioning Concentration: Normal Memory: Recent Intact;Remote Intact IQ: Average Insight: Fair Impulse Control: Poor Appetite: Poor Weight Loss: 30  Weight Gain: 0  Sleep: No Change Total Hours of Sleep: 3  Vegetative Symptoms: None  ADLScreening [facility] Assessment Services) Patient's cognitive ability adequate to safely complete daily activities?: Yes Patient able to express need for assistance with ADLs?: Yes Independently performs ADLs?: Yes  Abuse/Neglect [facility]) Physical Abuse: Denies Verbal Abuse: Denies Sexual Abuse: Denies  Prior Inpatient Therapy Prior Inpatient Therapy: Yes Prior Therapy Dates: one year ago Prior Therapy Facilty/Provider(s): [facility] Reason for Treatment: SI  Prior Outpatient Therapy Prior Outpatient Therapy: Yes Prior Therapy Dates: cannot remember  Prior Therapy Facilty/Provider(s): none at this time Reason for Treatment: chronic mental illness  ADL Screening (condition at time of admission) Patient's cognitive ability adequate to safely complete daily activities?: Yes Patient able to express need for assistance with ADLs?: Yes Independently performs ADLs?: Yes Weakness of Legs: None Weakness of Arms/Hands: None  Home Assistive Devices/Equipment Home Assistive Devices/Equipment: None    Abuse/Neglect Assessment (Assessment to be complete while patient is alone) Physical Abuse: Denies Verbal Abuse: Denies Sexual Abuse: Denies Exploitation of patient/patient's resources: Denies Self-Neglect: Denies Values / Beliefs Cultural Requests During Hospitalization: None Spiritual Requests During Hospitalization: None Consults  Spiritual  Care Consult Needed: No Social Work Consult Needed: No Merchant navy officer (For Healthcare) Advance Directive: Patient does not have advance directive;Patient would not like information    Additional Information 1:1 In Past 12 Months?: No CIRT Risk: No Elopement Risk: No Does patient have medical clearance?: Yes     Disposition:  Disposition Disposition of Patient: Inpatient treatment program Type of inpatient treatment program: Adult (Pt accepted [facility])  On Site Evaluation by:   Reviewed with Physician:  Docia Barrier, Rennis Harding 03/20/2012 8:14 AM
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
## 2012-03-20 NOTE — H&P (Signed)
Pt seen and evaluated upon admission.  Completed Admission Suicide Risk Assessment.  See orders & updated diagnostic formulation.  Pt agreeable with plan.  Discussed with team.
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
## 2012-03-20 NOTE — Progress Notes (Signed)
Patient ID: [name], male   DOB: 06/29/1956, 56 y.o.   MRN: [id number] After seeing MD this am he took the the medication as ordered and has been calm and cooperative. He has a order for a cream for his face but he wanted to shave and bath before appaling .   Up at this time requesting percocet  For pain and wanting to see Dr. I told him I would let her know.

## 2012-03-20 NOTE — ED Notes (Signed)
PT. ACCEPTED AT [facility] , ACT TEAM ADVISED NURSE THAT [employer] WILL TRANSPORT PT. THIS MORNING . BREAKFAST TRAY ORDERED.

## 2012-03-21 DIAGNOSIS — F1020 Alcohol dependence, uncomplicated: Secondary | ICD-10-CM

## 2012-03-21 MED ORDER — ENSURE COMPLETE PO LIQD
237.0000 mL | Freq: Every day | ORAL | Status: DC
  Administered 2012-03-24 – 2012-03-26 (×3): 237 mL via ORAL

## 2012-03-21 NOTE — Progress Notes (Signed)
[facility] Group Notes:  (Counselor/Nursing/MHT/Case Management/Adjunct)  03/21/2012 5:43 PM  Type of Therapy:  Group Therapy at 11 and 1:15  Participation Level:  Did Not Attend   Clide Dales 03/21/2012, 5:43 PM

## 2012-03-21 NOTE — Progress Notes (Signed)
Brief Nutrition Note  Reason: Patient screened at nutrition risk for unintentional weight loss > 10 lb over 1 month.   Patient reported good appetite. He stated he lost weight over the last 2.5 months due to drinking alcohol and skipping meals. He reported his weight 2.3 months ago was 187 lb.. Patient with unintentional 17 lb weight loss over 2.5 months, 9% from baseline. He voiced his food preferences. He likes cheese toast, peanut butter and jelly sandwich and chips. He stated he wanted to try Ensure nutrition supplement because he has lost so much weight.   Height: 5'6" Weight: 170 lb BMI: 27.43 kg/m^2  We discussed healthy nutrition and intake. I have encouraged the patient to eat regular meals. He was without any nutrition related questions and verbalized understanding of the nutrition information provided today.   Recommend if patient does not eat at meal times, offer PP&J or grilled cheese. Ordered patient Ensure nutrition supplement once daily.   RD available for nutrition needs.   Iven Finn [facility] [phone]

## 2012-03-21 NOTE — Discharge Planning (Signed)
Followed up on FAX yesterday by leaving a v/m msg for Sarah at [facility] requesting expedited review process/admission.

## 2012-03-21 NOTE — Progress Notes (Signed)
Lying quietly in bed with eyes closed.  Exhibiting normal sleep behavior.  No signs of acute withdrawal.  Q 15 minute safety checks in progress.
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
## 2012-03-21 NOTE — Progress Notes (Signed)
[facility] Adult Inpatient Family/Significant Other Suicide Prevention Education  Suicide Prevention Education:  Patient Refusal for Family/Significant Other Suicide Prevention Education: The patient [name] has refused to provide written consent for family/significant other to be provided Family/Significant Other Suicide Prevention Education during admission and/or prior to discharge.  Physician notified.  Writer provided suicide prevention education directly to patient; conversation included risk factors, warning signs and resources to contact for help. Mobile crisis services explained and contact card placed in chart for pt to receive at discharge.   Clide Dales 03/21/2012, 3:48 PM

## 2012-03-21 NOTE — Progress Notes (Signed)
Pt denies SI/HI/AVH. Pt states that he is depressed but refused to rate his depression. Pt is anxious. He states that he should not be here. Pt states that he needs long term treatment and we are not helping him. Pt states that he wants to go to [place]. Pt states that since he does not need to be here he is not going to attend groups. I explained to pt the importance of particpating in groups. Support and encouragement offered. Pt receptive, however he still refuses to comply since he feels that we are not able to help him.

## 2012-03-21 NOTE — Progress Notes (Signed)
[facility] MD Progress Note  03/21/2012 11:23 AM  Diagnosis:   Axis I: Alcohol Abuse and Substance Induced Mood Disorder Axis II: Deferred Axis III:  Past Medical History  Diagnosis Date  . Hepatitis C   . Depression   . Cirrhosis   . Pancreatitis   . Suicide attempt   . Hypertension   . Anxiety     ADL's:  Intact  Sleep: Poor  Appetite:  Fair  Suicidal Ideation:  Patient states "you can't hurt yourself and here." Homicidal Ideation:  Patient endorses HI toward a person he "doesn't like." Refuses to give a name or a reason why he does not like this person.  Subjective: [place] reports that he feels "weak" today. He states that he has lost 37 pounds in the past 2 months secondary to his excessive drinking and not eating. He endorses withdrawal symptoms of night sweats and tremors. He denies any cravings. He states that his appetite is improving. He reports that his sleep is poor and states he slept about 3 hours last night. He denies any auditory or visual hallucinations nor any paranoid ideation. He complains of pain in his stomach secondary to multiple abdominal surgeries. He expresses a desire to go to [facility] for an extended treatment program, in hopes of possibly getting some vocational rehabilitation.  AEB (as evidenced by):  Mental Status Examination/Evaluation: Objective:  Appearance: Fairly Groomed  Eye Contact::  Good  Speech:  Clear and Coherent  Volume:  Normal  Mood:  Depressed, Hopeless and Irritable  Affect:  Congruent  Thought Process:  Circumstantial and Tangential  Orientation:  Full  Thought Content:  WDL  Suicidal Thoughts:  Yes.  without intent/plan  Homicidal Thoughts:  Yes.  with intent/plan  Memory:  Immediate;   Good Recent;   Good Remote;   Good  Judgement:  Poor  Insight:  Lacking  Psychomotor Activity:  Tremor  Concentration:  Good  Recall:  Good  Akathisia:  No  Handed:    AIMS (if indicated):     Assets:  Desire for Improvement  Sleep:   Number of Hours: 6.75    Vital Signs:Blood pressure 95/60, pulse 97, temperature 98 F (36.7 C), temperature source Oral, resp. rate 16, height 5\' 6"  (1.676 m), weight 77.111 kg (170 lb). Current Medications: Current Facility-Administered Medications  Medication Dose Route Frequency Provider Last Rate Last Dose  . alum & mag hydroxide-simeth (MAALOX/MYLANTA) 200-200-20 MG/5ML suspension 30 mL  30 mL Oral Q4H PRN Sanjuana Kava, NP      . chlorproMAZINE (THORAZINE) tablet 50 mg  50 mg Oral TID Alyson Kuroski-Mazzei, DO   50 mg at 03/21/12 0801  . ferrous sulfate tablet 325 mg  325 mg Oral BID WC Sanjuana Kava, NP   325 mg at 03/21/12 0802  . hydrocortisone cream 1 %   Topical BID Alyson Kuroski-Mazzei, DO   1 application at 03/21/12 0802  . LORazepam (ATIVAN) tablet 1 mg  1 mg Oral Q6H PRN Alyson Kuroski-Mazzei, DO       Or  . LORazepam (ATIVAN) injection 1 mg  1 mg Intramuscular Q6H PRN Alyson Kuroski-Mazzei, DO      . LORazepam (ATIVAN) tablet 2 mg  2 mg Oral TID Alyson Kuroski-Mazzei, DO   2 mg at 03/21/12 0801  . mulitivitamin with minerals tablet 1 tablet  1 tablet Oral Daily Sanjuana Kava, NP   1 tablet at 03/21/12 0802  . naproxen (NAPROSYN) tablet 500 mg  500 mg Oral BID  WC Alyson Kuroski-Mazzei, DO   500 mg at 03/21/12 0801  . ondansetron (ZOFRAN-ODT) disintegrating tablet 4 mg  4 mg Oral Q6H PRN Sanjuana Kava, NP      . pantoprazole (PROTONIX) EC tablet 20 mg  20 mg Oral Q1200 Alyson Kuroski-Mazzei, DO   20 mg at 03/20/12 1115  . sulfamethoxazole-trimethoprim (BACTRIM DS) 800-160 MG per tablet 1 tablet  1 tablet Oral Q12H Alyson Kuroski-Mazzei, DO   1 tablet at 03/21/12 0802  . thiamine (B-1) injection 100 mg  100 mg Intramuscular Once Sanjuana Kava, NP   100 mg at 03/20/12 1138  . thiamine (VITAMIN B-1) tablet 100 mg  100 mg Oral Daily Sanjuana Kava, NP   100 mg at 03/21/12 0802  . traZODone (DESYREL) tablet 100 mg  100 mg Oral QHS Sanjuana Kava, NP   100 mg at 03/20/12 2204  .  DISCONTD: nicotine (NICODERM CQ - dosed in mg/24 hours) patch 21 mg  21 mg Transdermal Q0600 Sanjuana Kava, NP        Lab Results: No results found for this or any previous visit (from the past 48 hour(s)).  Physical Findings: AIMS:  , ,  ,  ,    CIWA:  CIWA-Ar Total: 0  COWS:     Treatment Plan Summary: Daily contact with patient to assess and evaluate symptoms and progress in treatment Medication management  Plan: We will continue his safe medical detox, and are awaiting placement for long-term treatment. Maricus Tanzi 03/21/2012, 11:23 AM

## 2012-03-22 ENCOUNTER — Encounter (HOSPITAL_COMMUNITY): Payer: Self-pay | Admitting: Emergency Medicine

## 2012-03-22 ENCOUNTER — Emergency Department (HOSPITAL_COMMUNITY): Admission: EM | Admit: 2012-03-22 | Payer: Medicaid Other | Source: Home / Self Care

## 2012-03-22 MED ORDER — CHLORPROMAZINE HCL 100 MG PO TABS
100.0000 mg | ORAL_TABLET | Freq: Once | ORAL | Status: AC
  Administered 2012-03-22: 100 mg via ORAL
  Filled 2012-03-22 (×2): qty 1

## 2012-03-22 MED ORDER — CEPHALEXIN 500 MG PO CAPS: 500.0000 mg | ORAL_CAPSULE | Freq: Four times a day (QID) | ORAL | Status: DC

## 2012-03-22 MED ORDER — CEPHALEXIN 500 MG PO CAPS
500.0000 mg | ORAL_CAPSULE | Freq: Four times a day (QID) | ORAL | Status: DC
  Administered 2012-03-22 – 2012-03-27 (×18): 500 mg via ORAL
  Filled 2012-03-22 (×22): qty 1

## 2012-03-22 MED ORDER — LORAZEPAM 1 MG PO TABS
2.0000 mg | ORAL_TABLET | Freq: Two times a day (BID) | ORAL | Status: DC
  Administered 2012-03-23: 2 mg via ORAL
  Filled 2012-03-22: qty 2

## 2012-03-22 MED ORDER — TRAMADOL HCL 50 MG PO TABS
100.0000 mg | ORAL_TABLET | Freq: Once | ORAL | Status: AC
  Administered 2012-03-22: 100 mg via ORAL
  Filled 2012-03-22 (×2): qty 2

## 2012-03-22 NOTE — ED Notes (Signed)
Attempted to call report to [facility]. RN will call back.

## 2012-03-22 NOTE — Progress Notes (Signed)
Patient transferred to [facility] ED, report called to Mechele Dawley, also spoke with Lou Miner.
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
## 2012-03-22 NOTE — ED Notes (Signed)
Pt here for wound check. [facility] patient. Sitter at bedside.

## 2012-03-22 NOTE — Progress Notes (Signed)
Pt has refused to go to groups today, demanding to go to [place]. Pt c/o that "nobody is treating my sores, the doctors at [facility] will!" Pt irritable, demanding to leave. Noted in history, that internist attempted to see pt on 6/4, pt refused. Dsg changed, pt states "I had MRSA 5 years ago." Noted purulent drainage. Reported to [place], [place], and [facility]. Pt is going to meals, area of wound covered today.

## 2012-03-22 NOTE — ED Notes (Signed)
Pt here for wound check. Is a pt at behavioral health.

## 2012-03-22 NOTE — ED Notes (Signed)
Attempted to call report x2 to [facility]. States their physician would like to speak with our ED attending. Phone number and message given to Dr. Anitra Lauth

## 2012-03-22 NOTE — ED Notes (Signed)
Dr. Plunkett at bedside.  
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
## 2012-03-22 NOTE — Discharge Planning (Signed)
Dr at [facility] has not yet reviewed submitted paperwork on [place].  Told to call back in AM.

## 2012-03-22 NOTE — Progress Notes (Signed)
Patient refuses to keep draining abdominal wound covered. Wound redressed by Clinical research associate at [employer]. Patient educated to leave dressing on abdomen, patient refuses to comply with staff. Patient encouraged to practice excellent hand hygiene and wound hygiene. Will continue to monitor.

## 2012-03-23 LAB — CBC
HCT: 29.7 % — ABNORMAL LOW (ref 39.0–52.0)
Hemoglobin: 9.4 g/dL — ABNORMAL LOW (ref 13.0–17.0)
MCH: 28.1 pg (ref 26.0–34.0)
MCV: 88.7 fL (ref 78.0–100.0)
RBC: 3.35 MIL/uL — ABNORMAL LOW (ref 4.22–5.81)
WBC: 3.6 10*3/uL — ABNORMAL LOW (ref 4.0–10.5)

## 2012-03-23 LAB — COMPREHENSIVE METABOLIC PANEL
AST: 52 U/L — ABNORMAL HIGH (ref 0–37)
Albumin: 2.6 g/dL — ABNORMAL LOW (ref 3.5–5.2)
BUN: 10 mg/dL (ref 6–23)
Calcium: 8.9 mg/dL (ref 8.4–10.5)
Chloride: 107 mEq/L (ref 96–112)
Creatinine, Ser: 0.7 mg/dL (ref 0.50–1.35)
Total Bilirubin: 0.4 mg/dL (ref 0.3–1.2)
Total Protein: 6.9 g/dL (ref 6.0–8.3)

## 2012-03-23 IMAGING — CR DG ABDOMEN ACUTE W/ 1V CHEST
3 series · 3 of 3 positions shown · non-contrast
Comparison: 01/25/2010.

CLINICAL DATA: Vomiting.  Nausea.

ACUTE ABDOMEN SERIES (ABDOMEN 2 VIEW & CHEST 1 VIEW)

[w chest pa]
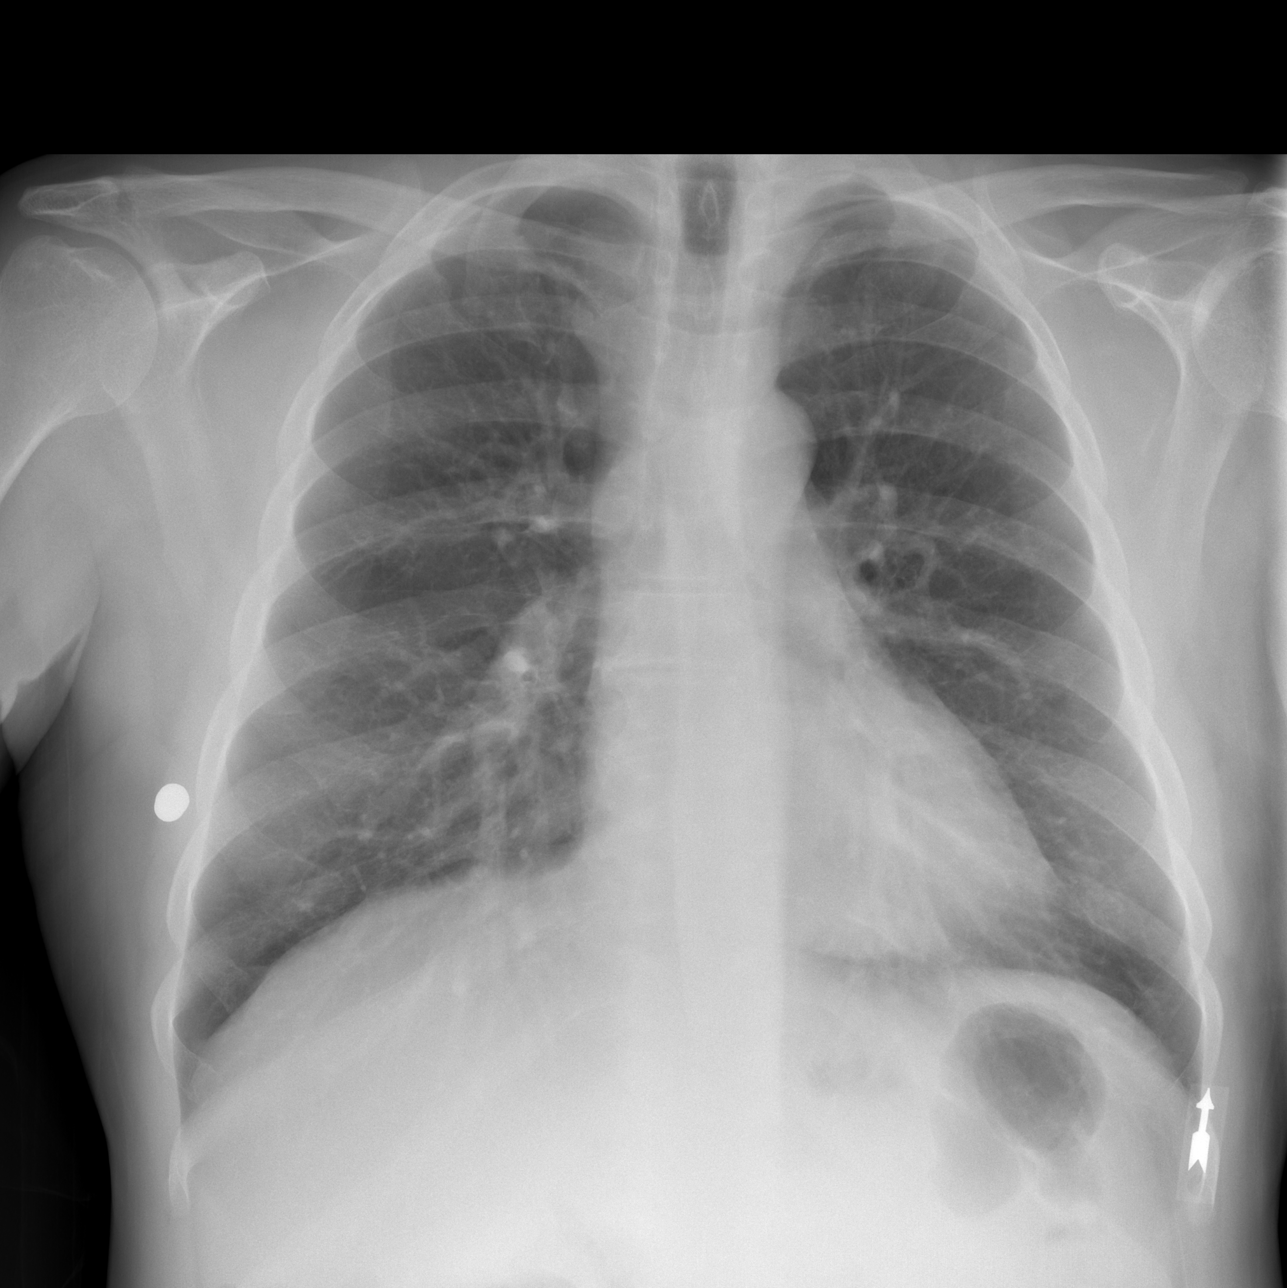

[w abdomen upright *]
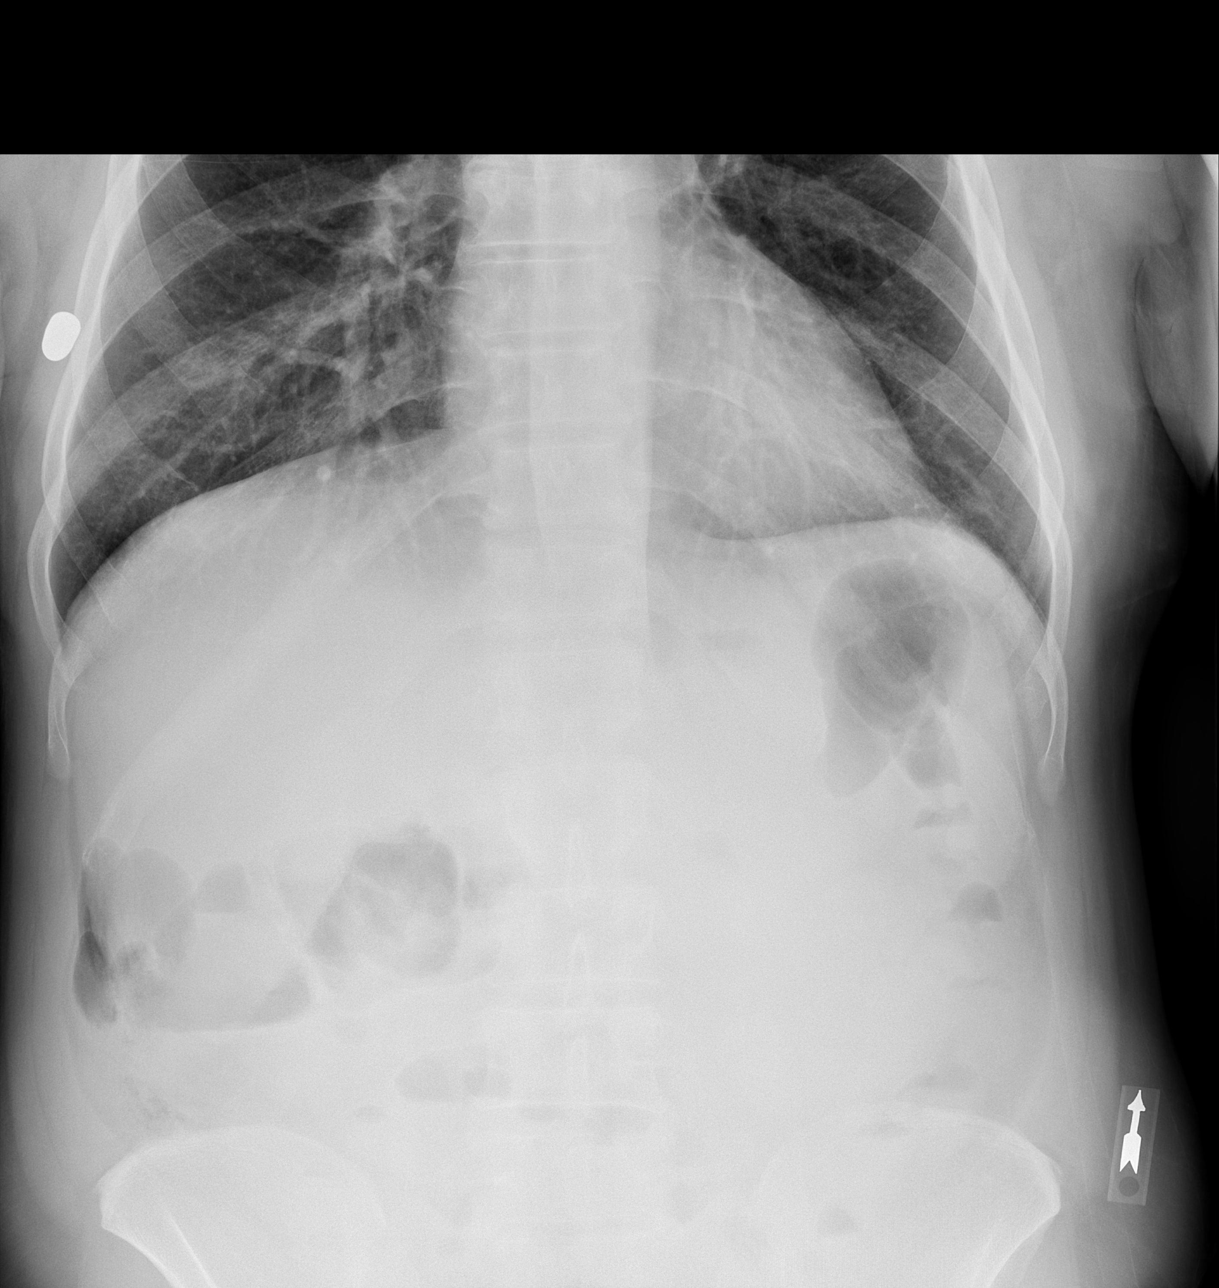

[t abdomen supine]
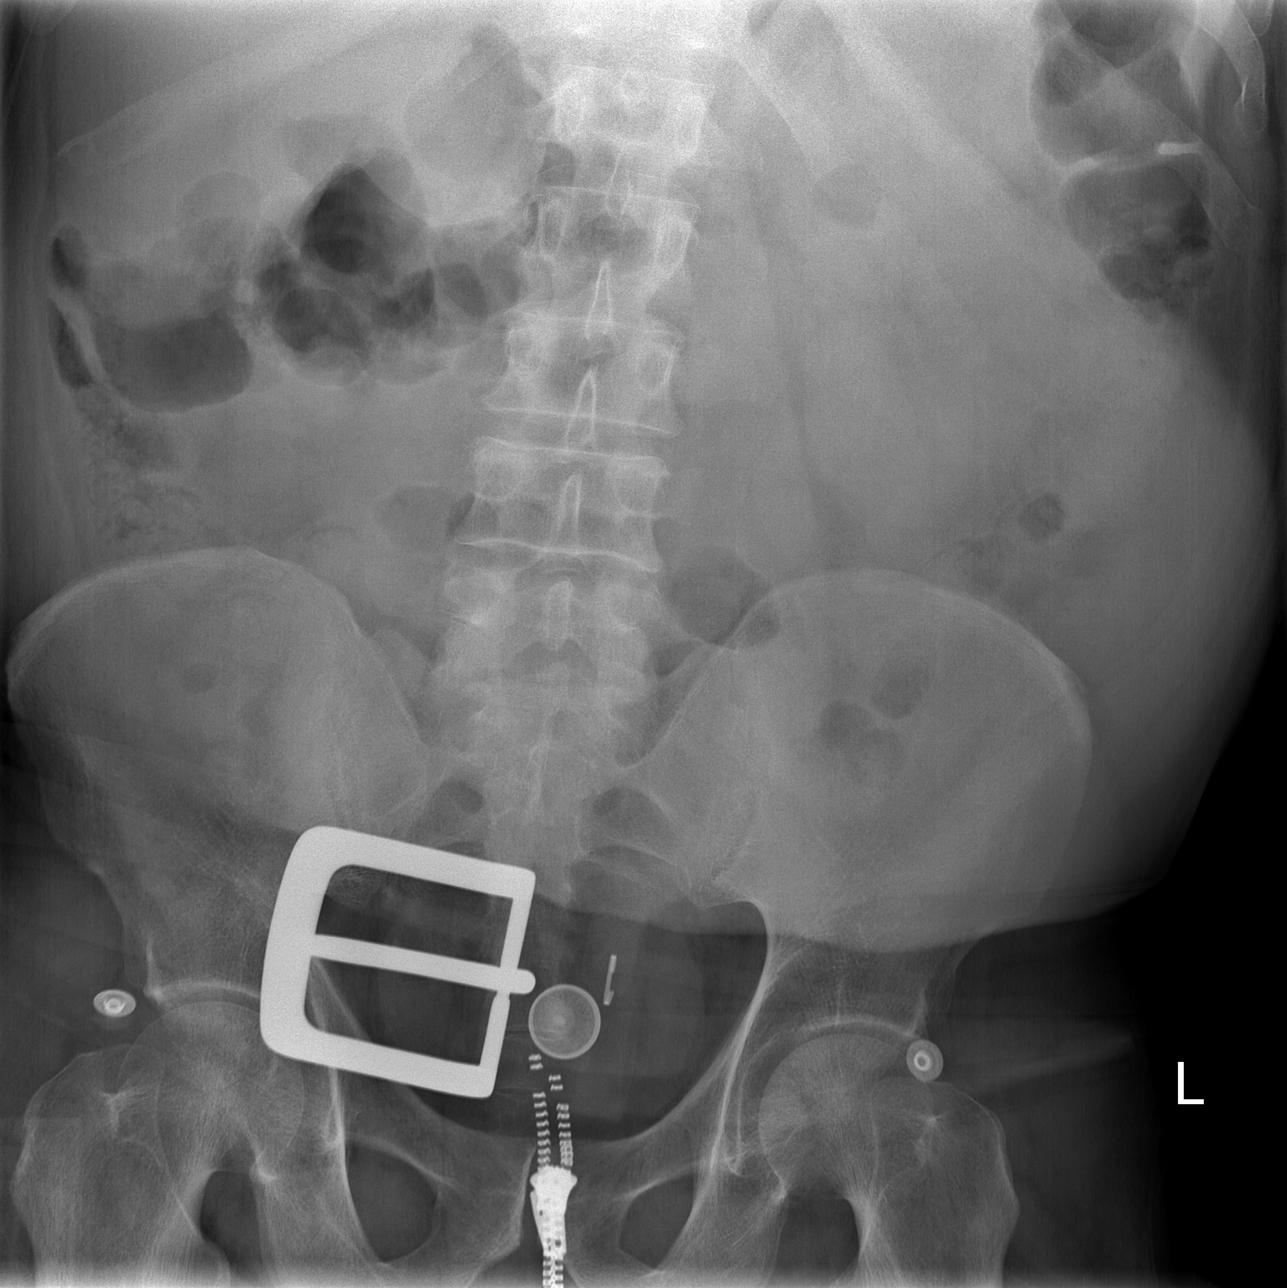

[3 of 3 positions shown; findings below may reference images not displayed]

FINDINGS: No acute chest findings.  Normal cardiomediastinal
silhouette.  Bowel gas pattern unremarkable.  No free air.  Bony
structures intact.
IMPRESSION: No acute chest or abdominal findings.

## 2012-03-23 MED ORDER — CHLORPROMAZINE HCL 25 MG PO TABS
25.0000 mg | ORAL_TABLET | Freq: Three times a day (TID) | ORAL | Status: DC
  Administered 2012-03-23 – 2012-03-27 (×11): 25 mg via ORAL
  Filled 2012-03-23 (×17): qty 1

## 2012-03-23 MED ORDER — LORAZEPAM 1 MG PO TABS
2.0000 mg | ORAL_TABLET | Freq: Every day | ORAL | Status: DC
  Administered 2012-03-24 – 2012-03-27 (×4): 2 mg via ORAL
  Filled 2012-03-23 (×5): qty 2

## 2012-03-23 NOTE — Progress Notes (Signed)
1:1 NOTE 0800:  Patient refused to fill out inventory sheet today, wants to go home, does not want to be here at [facility], wants to go to [place], denies Si or Hi, eating meals, not going to group, not participating or interacting w/peers, staying in room/bed most of day.wound continues to drain. 1:1 continues for protection of this wound/safety. Safety checks continue and support offered.

## 2012-03-23 NOTE — Progress Notes (Signed)
1:1 NOTE 12noon: patient taking meds as ordered, continues to appear irritated and somewhat agitated, staying in room/bed, continues to deny Sior HI, denies AV/ hallucinations, eating meals in his room, voicing no complaints other wanting to leave and go home. 1:1 continues at this time. Patient is safe.

## 2012-03-23 NOTE — Progress Notes (Signed)
Patient in bed, angry and irritable at the begining of this shift.  He yelled at this writer to get out of his room and live him alone. Writer offered him his 2000 antibiotic and Ativan 1 mg for the agitation. He relunctantly received the medications, but refused to have his dressing changed. Writer returned to patient room at 2200 to ask him if he would like to take his bedtime medication and have his dressing changed. He is a little calm at this time, he said yes. Pt received his scheduled Trazodone, and his wound was clean with betadine and redressed. Wound appeared to be improving and better than it looked yesterday, he tolerated the dressing change. Pt stated that he has been approved for a bed at [facility] and would be going there next week. 1:1 observation continues to maintain safety.
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
## 2012-03-23 NOTE — Progress Notes (Signed)
[facility] Group Notes:  (Counselor/Nursing/MHT/Case Management/Adjunct)  03/23/2012 5:37 PM  Type of Therapy:  Group Therapy at 11 and 1:14 on Thursday 03/22/12  Participation Level:  Did Not Attend    [name] 03/23/2012, 5:37 PM

## 2012-03-23 NOTE — Progress Notes (Signed)
1600 1:1 NOTE:  Patient staying in bed, sleeping off and on thru out the day, irritable to a degree, asked to take a shower but has not done so as of now, denies Si or Hi, taking meds, seen by the MD and some orders written, patient continues as 1:1 without problems, patient remains safe.

## 2012-03-23 NOTE — Treatment Plan (Signed)
Interdisciplinary Treatment Plan Update (Adult)  Date: 03/23/2012  Time Reviewed: 8:30 AM   Progress in Treatment: Attending groups: No Participating in groups: No Taking medication as prescribed: Yes Tolerating medication: Yes   Family/Significant othe contact made:   Patient understands diagnosis:  Yes Discussing patient identified problems/goals with staff:  Yes Medical problems stabilized or resolved:  Yes Denies suicidal/homicidal ideation: Yes  To nurse Issues/concerns per patient self-inventory:  Refused to fill out Other:  New problem(s) identified: N/A  Reason for Continuation of Hospitalization: Medical Issues Medication stabilization  Interventions implemented related to continuation of hospitalization: [name] is on 2 antibiotics for his wounds.  Was sent to [facility] on Thursday and will be seen again by DR on Monday  If that report is normal, he will be transferred to [facility] on Tues. Additional comments:  Estimated length of stay: 3-4 days  Discharge Plan:Transfer to [facility]  New goal(s): N/A  Review of initial/current patient goals per problem list:   1.  Goal(s):Address medical problems  Met:  Yes  Target date:6/7  As evidenced AV:[id number] eliminated, Coda continues on thorazine.  Pain is manageable with Naproxen.  2.  Goal (s):Get into [facility] ASAP  Met:  No  Target date:6/11  As evidenced by:Bed offered contingent on medical clearance on Monday  3.  Goal(s):Eliminate SI  Met:  Yes  Target date:6/7  As evidenced JY:NWGNF denies SI today to nurse  4.  Goal(s): Stabilize mood  Met:  No  Target date:6/11  As evidenced AO:[id number] to state he is depressed, routinely agitated because he is here and not in "[facility]"  Attendees: Patient:   03/23/2012 8:30 AM  Family:     Physician:  Lupe Carney 03/23/2012 8:30 AM   Nursing:    03/23/2012 8:30 AM   Case Manager:  Richelle Ito, LCSW 03/23/2012 8:30 AM   Counselor:   03/23/2012 8:30 AM   Other:       Other:     Other:     Other:      Scribe for Treatment Team:   Ida Rogue, 03/23/2012 8:30 AM

## 2012-03-23 NOTE — Progress Notes (Signed)
[facility] Group Notes:  (Counselor/Nursing/MHT/Case Management/Adjunct)  03/23/2012 5:32 PM  Type of Therapy:  Group Therapy at 11 and 1:15  Participation Level:  Did Not Attend  Clide Dales 03/23/2012, 5:32 PM

## 2012-03-23 NOTE — Discharge Planning (Signed)
[name], NP at [facility] called to say [name] is accepted for Tues pending medical clearance from our Dr on Monday.  Will send via sheriff Tues AM if all is well.

## 2012-03-24 NOTE — Progress Notes (Signed)
1:1 Note Pt 1:1 status is ongoing due to increased risk for self harm. Pt mood and affect are improved from this AM. Pt denies SI/HI and AVH. Pt is awake and active in the milieu this afternoon. Pt is withdrawn and guarded but he is cooperative with staff. Pt abdominal dressing changed. Wound has granulating tissue and there are no signs of infection. Pt appetite is good as well. Pt environment is safe. Writer will continue to monitor.

## 2012-03-24 NOTE — Progress Notes (Signed)
Patient ID: [name], male   DOB: 12/29/1955, 55 y.o.   MRN: [id number]  Pt. did not attend aftercare planning group.  

## 2012-03-24 NOTE — Progress Notes (Signed)
[facility] MD Progress Note  03/24/2012 12:23 PM  Diagnosis:   Axis I: Alcohol Abuse and Substance Induced Mood Disorder Axis II: Deferred Axis III:  Past Medical History  Diagnosis Date  . Hepatitis C   . Depression   . Cirrhosis   . Pancreatitis   . Suicide attempt   . Hypertension   . Anxiety    Subjective: [name] was in his room eating lunch accompanied by his one-to-one sitter. He asked that he was doing he responded only "I want to know when I'm going to get out of here, and I want to know why this guy has to sit over there and watch me." When asked whether he was having any suicidal thoughts, he shook his head and stated "you don't know what goes on inside my head." He complained that he did not sleep well last night, and when asked if his appetite was okay, he shook his head as if to say no, but proceeded to eat his lunch.  ADL's:  Intact  Sleep: Fair  Appetite:  Fair  Suicidal Ideation:  Patient refused to give a clear answer Homicidal Ideation:  Patient refused to give a clear answer  AEB (as evidenced by):  Mental Status Examination/Evaluation: Objective:  Appearance: Disheveled  Eye Contact::  Poor  Speech:  Clear and Coherent  Volume:  Normal  Mood:  Irritable  Affect:  Congruent  Thought Process:  Irrelevant  Orientation:  Full  Thought Content:  Undetermined  Suicidal Thoughts:  No  Homicidal Thoughts:  No  Memory:  Immediate;   Good  Judgement:  Poor  Insight:  Lacking  Psychomotor Activity:  Normal  Concentration:  Good  Recall:  Good  Akathisia:  No  Handed:    AIMS (if indicated):     Assets:    Sleep:  Number of Hours: 6.75    Vital Signs:Blood pressure 108/66, pulse 86, temperature 97.8 F (36.6 C), temperature source Oral, resp. rate 16, height 5\' 6"  (1.676 m), weight 77.111 kg (170 lb), SpO2 98.00%. Current Medications: Current Facility-Administered Medications  Medication Dose Route Frequency Provider Last Rate Last Dose  . alum & mag  hydroxide-simeth (MAALOX/MYLANTA) 200-200-20 MG/5ML suspension 30 mL  30 mL Oral Q4H PRN Sanjuana Kava, NP      . cephALEXin (KEFLEX) capsule 500 mg  500 mg Oral QID Alyson Kuroski-Mazzei, DO   500 mg at 03/24/12 1144  . chlorproMAZINE (THORAZINE) tablet 25 mg  25 mg Oral TID Alyson Kuroski-Mazzei, DO   25 mg at 03/24/12 1144  . feeding supplement (ENSURE COMPLETE) liquid 237 mL  237 mL Oral QPC lunch Anastasio Champion, RD      . ferrous sulfate tablet 325 mg  325 mg Oral BID WC Sanjuana Kava, NP   325 mg at 03/24/12 0800  . hydrocortisone cream 1 %   Topical BID Alyson Kuroski-Mazzei, DO   1 application at 03/24/12 1144  . LORazepam (ATIVAN) tablet 1 mg  1 mg Oral Q6H PRN Alyson Kuroski-Mazzei, DO   1 mg at 03/23/12 1940   Or  . LORazepam (ATIVAN) injection 1 mg  1 mg Intramuscular Q6H PRN Alyson Kuroski-Mazzei, DO      . LORazepam (ATIVAN) tablet 2 mg  2 mg Oral Daily Alyson Kuroski-Mazzei, DO   2 mg at 03/24/12 0800  . mulitivitamin with minerals tablet 1 tablet  1 tablet Oral Daily Sanjuana Kava, NP   1 tablet at 03/24/12 0800  . naproxen (NAPROSYN) tablet  500 mg  500 mg Oral BID WC Alyson Kuroski-Mazzei, DO   500 mg at 03/24/12 0800  . pantoprazole (PROTONIX) EC tablet 20 mg  20 mg Oral Q1200 Alyson Kuroski-Mazzei, DO   20 mg at 03/24/12 1144  . sulfamethoxazole-trimethoprim (BACTRIM DS) 800-160 MG per tablet 1 tablet  1 tablet Oral Q12H Alyson Kuroski-Mazzei, DO   1 tablet at 03/24/12 0800  . thiamine (VITAMIN B-1) tablet 100 mg  100 mg Oral Daily Sanjuana Kava, NP   100 mg at 03/24/12 0800  . traZODone (DESYREL) tablet 100 mg  100 mg Oral QHS Sanjuana Kava, NP   100 mg at 03/23/12 2152  . DISCONTD: chlorproMAZINE (THORAZINE) tablet 50 mg  50 mg Oral TID Alyson Kuroski-Mazzei, DO   50 mg at 03/23/12 1158  . DISCONTD: LORazepam (ATIVAN) tablet 2 mg  2 mg Oral BID Alyson Kuroski-Mazzei, DO   2 mg at 03/23/12 0802    Lab Results:  Results for orders placed during the hospital encounter of  03/20/12 (from the past 48 hour(s))  COMPREHENSIVE METABOLIC PANEL     Status: Abnormal   Collection Time   03/23/12  6:18 AM      Component Value Range Comment   Sodium 137  135 - 145 (mEq/L)    Potassium 4.2  3.5 - 5.1 (mEq/L)    Chloride 107  96 - 112 (mEq/L)    CO2 23  19 - 32 (mEq/L)    Glucose, Bld 95  70 - 99 (mg/dL)    BUN 10  6 - 23 (mg/dL)    Creatinine, Ser 5.57  0.50 - 1.35 (mg/dL)    Calcium 8.9  8.4 - 10.5 (mg/dL)    Total Protein 6.9  6.0 - 8.3 (g/dL)    Albumin 2.6 (*) 3.5 - 5.2 (g/dL)    AST 52 (*) 0 - 37 (U/L)    ALT 34  0 - 53 (U/L)    Alkaline Phosphatase 52  39 - 117 (U/L)    Total Bilirubin 0.4  0.3 - 1.2 (mg/dL)    GFR calc non Af Amer >90  >90 (mL/min)    GFR calc Af Amer >90  >90 (mL/min)   CBC     Status: Abnormal   Collection Time   03/23/12  6:18 AM      Component Value Range Comment   WBC 3.6 (*) 4.0 - 10.5 (K/uL)    RBC 3.35 (*) 4.22 - 5.81 (MIL/uL)    Hemoglobin 9.4 (*) 13.0 - 17.0 (g/dL)    HCT 32.2 (*) 02.5 - 52.0 (%)    MCV 88.7  78.0 - 100.0 (fL)    MCH 28.1  26.0 - 34.0 (pg)    MCHC 31.6  30.0 - 36.0 (g/dL)    RDW 42.7  06.2 - 37.6 (%)    Platelets 113 (*) 150 - 400 (K/uL)     Physical Findings: AIMS:  , ,  ,  ,    CIWA:  CIWA-Ar Total: 0  COWS:     Treatment Plan Summary: Daily contact with patient to assess and evaluate symptoms and progress in treatment Medication management  Plan: We are awaiting word from [facility] for placement  [facility] 03/24/2012, 12:23 PM
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
## 2012-03-24 NOTE — Progress Notes (Signed)
1:1 Note 1:1 observation is ongoing due to increased risk for self harm. Pt has been awake and alert this afternoon. Pt denies SI/HI and AVH. Pt states that the reason for his 1:1 status was a misunderstanding and that he feels like he is being cooperative with staff. Writer encouraged pt to follow [facility] policy and to continue to go along with procedure. Pt environment is free of hazards. Pt abdominal dressing is clean dry and intact. Pt mood and affect are appropriate to the situation. Writer will continue to monitor.

## 2012-03-24 NOTE — Progress Notes (Signed)
See notes on 1:1 flowsheet 

## 2012-03-24 NOTE — Progress Notes (Signed)
Patient ID: [name], male   DOB: [birth year], 56 y.o.   MRN: [id number]   [facility] Group Notes:  (Counselor/Nursing/MHT/Case Management/Adjunct)  03/24/2012 1:15 PM  Type of Therapy:  Group Therapy, Dance/Movement Therapy   Participation Level:  Did Not Attend      Cassidi Long

## 2012-03-25 IMAGING — CR DG ABDOMEN ACUTE W/ 1V CHEST
3 series · 3 of 3 positions shown · non-contrast
Comparison: 03/01/2010

CLINICAL DATA: Vomiting blood.

ACUTE ABDOMEN SERIES (ABDOMEN 2 VIEW & CHEST 1 VIEW)

[w chest pa]
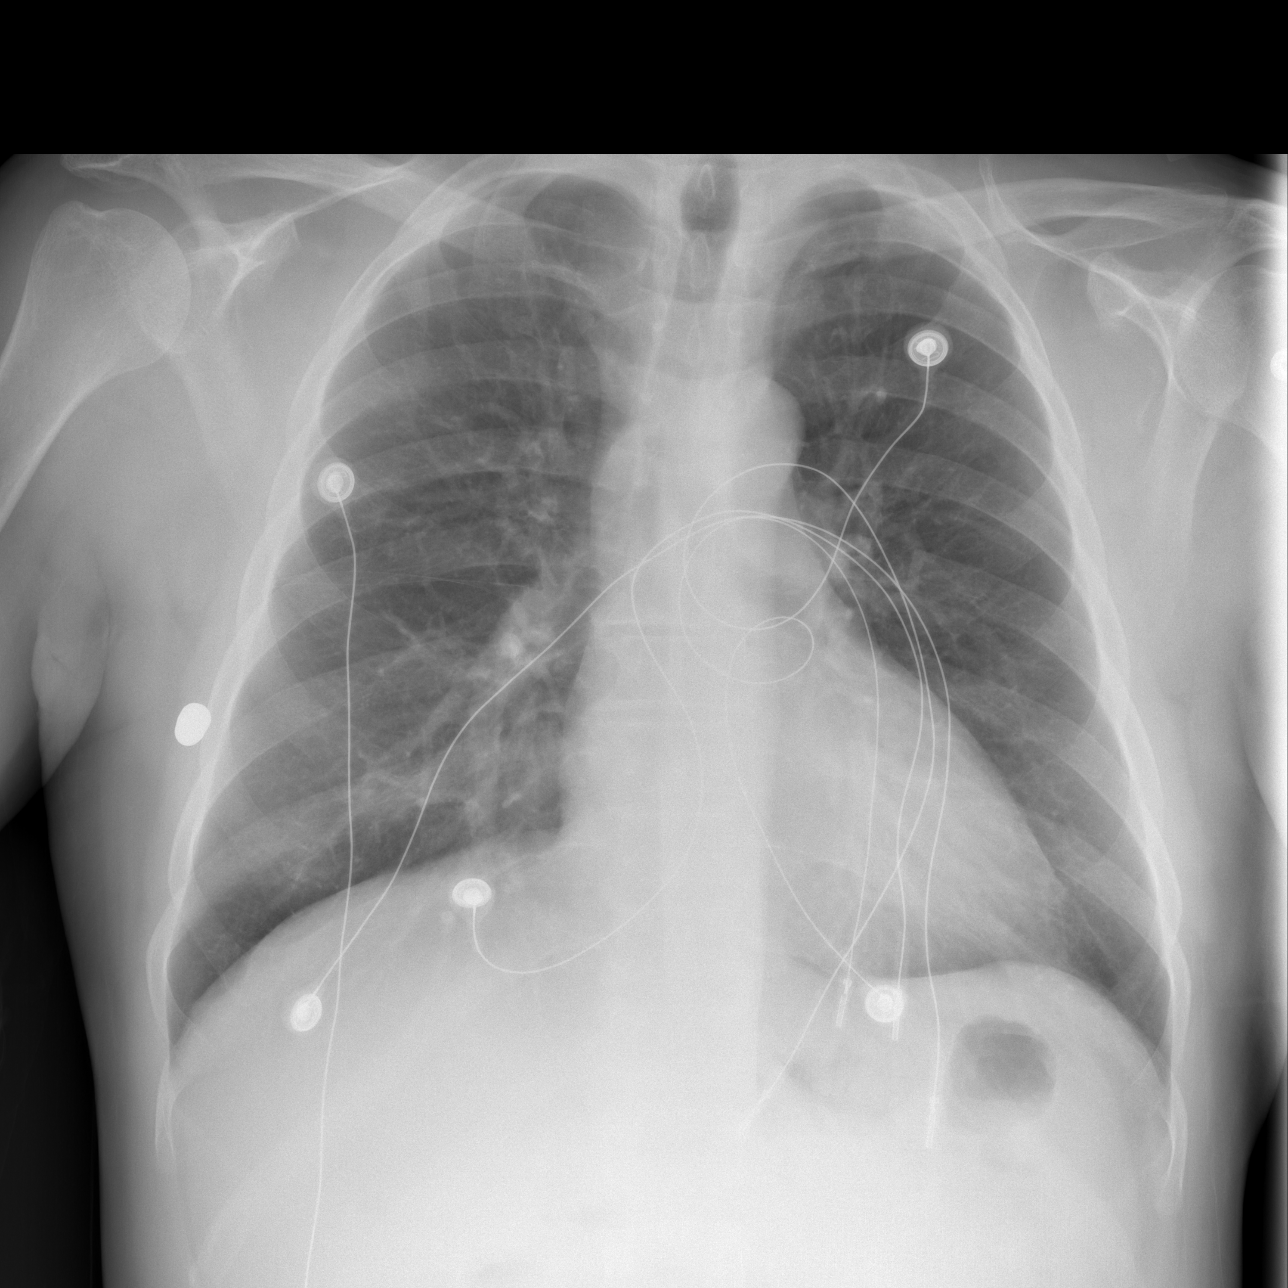

[w abdomen upright *]
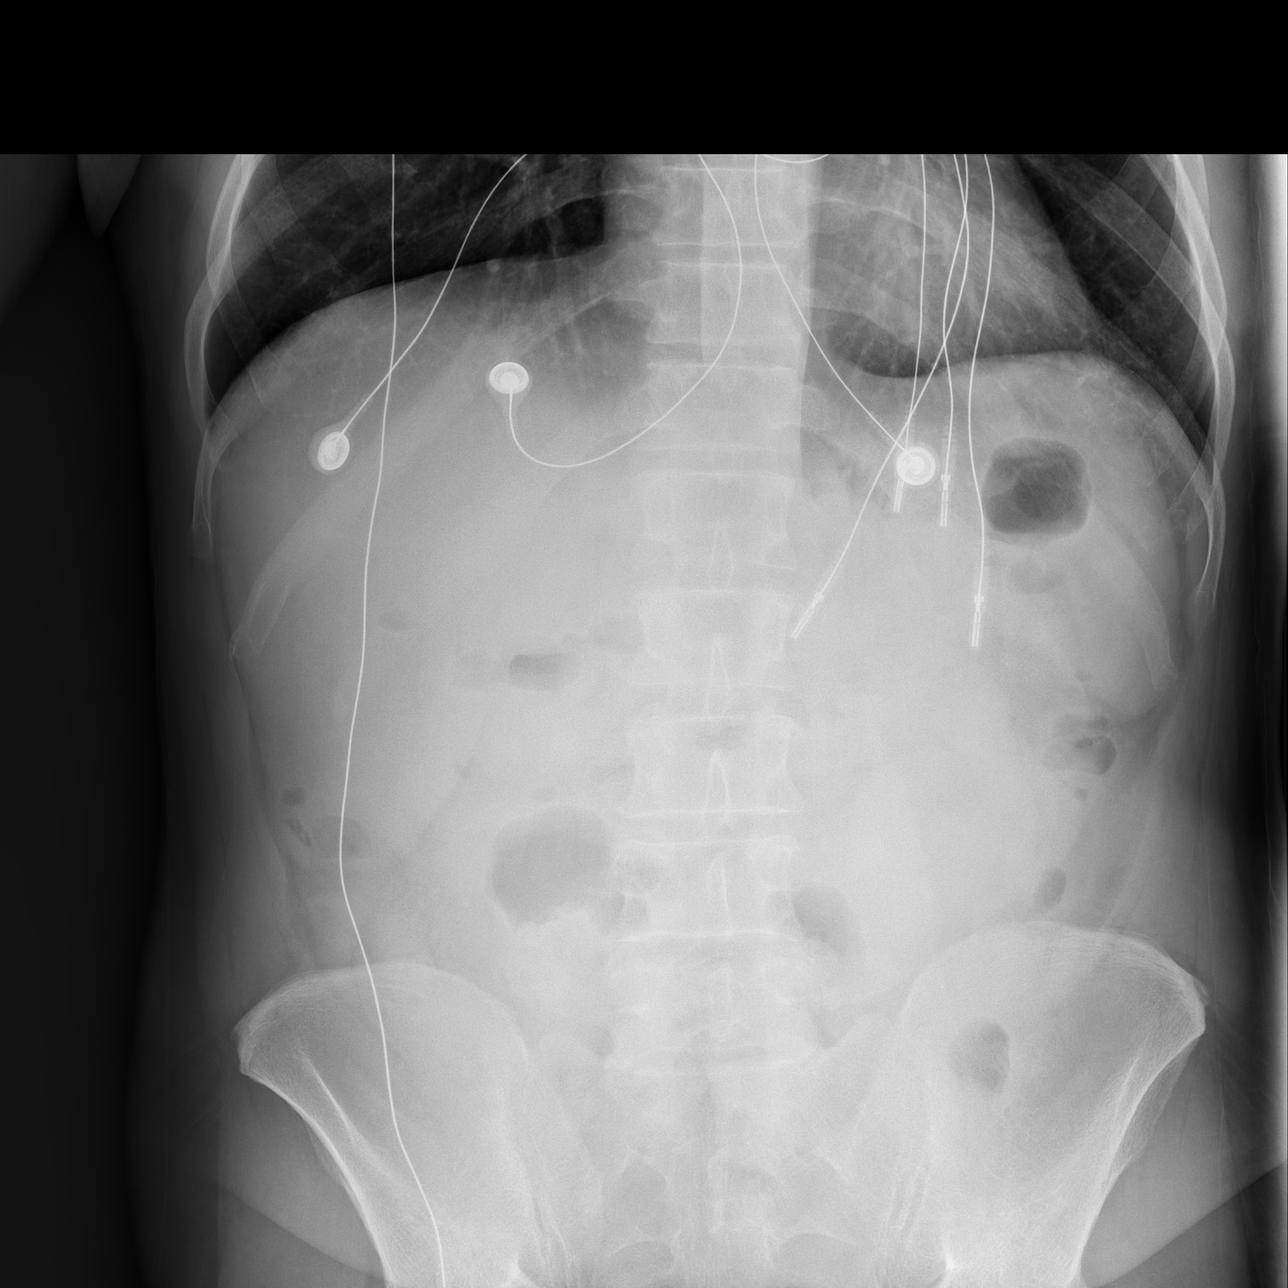

[t abdomen supine]
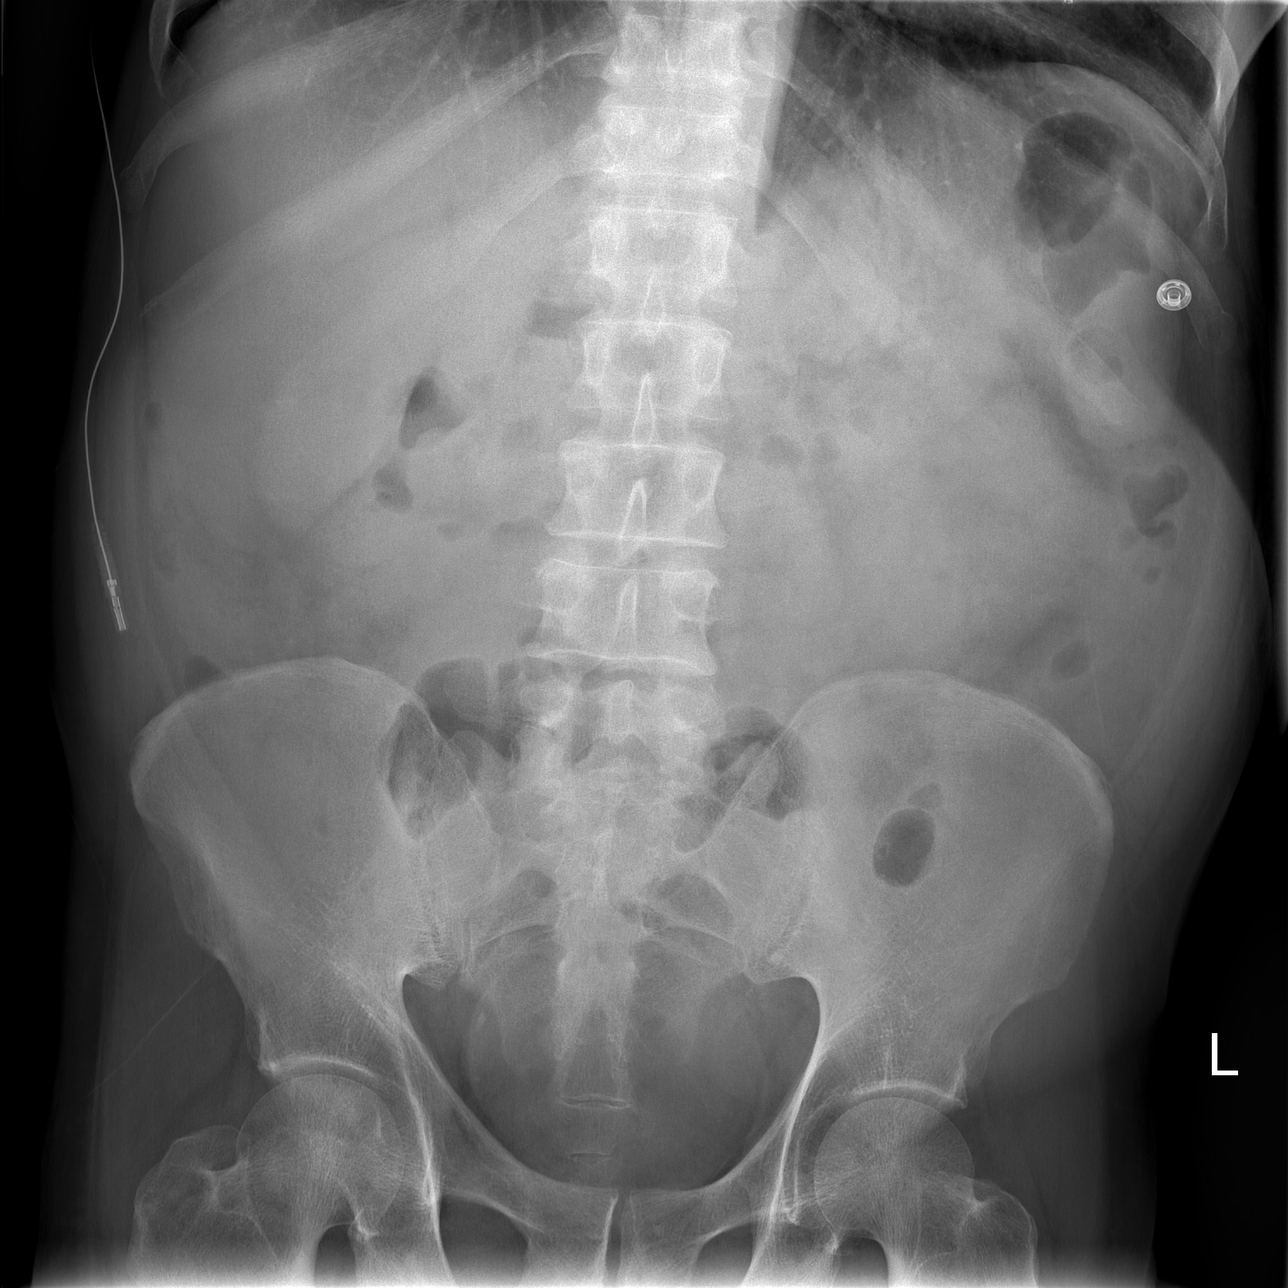

[3 of 3 positions shown; findings below may reference images not displayed]

FINDINGS: Frontal view of the chest shows midline trachea and
normal heart size.  Lungs are clear.  No pleural fluid.

Two views of the abdomen show gas and stool scattered in the colon.
There are a few scattered air fluid levels as well.  No definite
small bowel dilatation.
IMPRESSION: Bowel gas pattern is nonspecific.  No evidence of overt
obstruction.

## 2012-03-25 MED ORDER — HYDROCORTISONE 1 % EX CREA: TOPICAL_CREAM | Freq: Every evening | CUTANEOUS | Status: DC | PRN

## 2012-03-25 MED ORDER — TRAZODONE HCL 100 MG PO TABS
200.0000 mg | ORAL_TABLET | Freq: Every day | ORAL | Status: DC
  Administered 2012-03-25 – 2012-03-26 (×2): 200 mg via ORAL
  Filled 2012-03-25 (×3): qty 2

## 2012-03-25 MED ORDER — HYDROCORTISONE 0.5 % EX CREA: TOPICAL_CREAM | Freq: Every evening | CUTANEOUS | Status: DC | PRN

## 2012-03-25 NOTE — Progress Notes (Addendum)
Pt in room laying in bed.  Awakens easily.  Pt took 2000 medications and requested that he be awakened to take night time medications if he is asleep.  Pt very tired since he slept little to none last night.  Pt pleased that his night time medication dosage was increased and hopeful that this well help him to sleep through the night.  Dressing to abdomen clean dry and intact, will change when night time medications given per pt request.  Denies SI/HI/hallucinations at this time.  Has been in room a good part of day due to fatigue.  1:1 was discontinued on day shift with all appropriate documentation completed then.  Support and encouragement offered, will continue to monitor.  2140:  Pt refused evening dressing change, as well as hydrocortisone cream ordered for HS, stating "I just want to go to sleep, it can wait"    Night time medications given, Pt currently in bed with even unlabored respirations.  Will continue to monitor.

## 2012-03-25 NOTE — Progress Notes (Signed)
Patient ID: [name], male   DOB: 1956/03/11, 56 y.o.   MRN: [id number]  Pt. did not attend aftercare planning group.

## 2012-03-25 NOTE — Progress Notes (Signed)
1:1 Discontinued 1:1 Observation is d/c'd at this time. Pt has been demonstrating self control and is cooperative with staff. Pt is unwilling to participate in the milieu but he has not been manipulating his abdominal dressing. Pt denies SI/HI and AVH. Pt is taking his medications as prescribed. Writer will continue to monitor.

## 2012-03-25 NOTE — Progress Notes (Signed)
Patient ID: [name], male   DOB: 03-03-1956, 56 y.o.   MRN: [id number]   [facility] Group Notes:  (Counselor/Nursing/MHT/Case Management/Adjunct)  03/25/2012 1:15 PM  Type of Therapy:  Group Therapy, Dance/Movement Therapy   Participation Level:  Did Not Attend      Cassidi Long

## 2012-03-25 NOTE — Progress Notes (Signed)
1:1 Note 1:1 observation is ongoing due to increased risk for self harm. Pt currently denies SI/HI and AVH. Pt mood is depressed and affect is sad/flat but pt is cooperative with staff. Pt is unwilling to attend groups and still insists that he would rather be at [facility] psychiatric facility. Pt spends most of his time in room but sometimes visits the dayroom. Pt abdominal dressing is clean, dry and intact. Pt c/o lack of sleep. PA notified. Pt environment is safe and free of hazards. Writer will continue to monitor.

## 2012-03-25 NOTE — Progress Notes (Signed)
[name]  03/25/2012 10:17 AM  Diagnosis:   Axis I: Alcohol Abuse and Substance Induced Mood Disorder Axis II: Deferred Axis III:  Past Medical History  Diagnosis Date  . Hepatitis C   . Depression   . Cirrhosis   . Pancreatitis   . Suicide attempt   . Hypertension   . Anxiety    Subjective: [name] reports that he is doing somewhat better today. He denies any suicidal ideation nor any homicidal ideation. He denies any auditory or visual hallucinations. He is less irritable. He denies any cravings or withdrawal. He expresses some desire to get help from the hospital in [place]. He denies that he has been pulling at his wound. He reports he did not sleep well last night and asked that his sleep medication be increased. He also asked for hydrocortisone cream to apply to his face at bedtime to treat discomfort from shaving. He reports his appetite is improving.  ADL's:  Intact  Sleep: Fair  Appetite:  Good  Suicidal Ideation:  Denies any thoughts, plan, or intent Homicidal Ideation:  Denies any thoughts, plan, or intent  AEB (as evidenced by):  Mental Status Examination/Evaluation: Objective:  Appearance: Disheveled  Eye Contact::  Fair  Speech:  Clear and Coherent  Volume:  Normal  Mood:  Dysphoric and Irritable  Affect:  Congruent  Thought Process:  Logical  Orientation:  Full  Thought Content:  WDL  Suicidal Thoughts:  No  Homicidal Thoughts:  No  Memory:  Immediate;   Good  Judgement:  Fair  Insight:  Fair  Psychomotor Activity:  Psychomotor Retardation  Concentration:  Good  Recall:  Good  Akathisia:  No  Handed:    AIMS (if indicated):     Assets:  Desire for Improvement Resilience  Sleep:  Number of Hours: 2.75    Vital Signs:Blood pressure 82/50, pulse 94, temperature 96.5 F (35.8 C), temperature source Oral, resp. rate 20, height 5\' 6"  (1.676 m), weight 77.111 kg (170 lb), SpO2 98.00%. Current Medications: Current Facility-Administered  Medications  Medication Dose Route Frequency Provider Last Rate Last Dose  . alum & mag hydroxide-simeth (MAALOX/MYLANTA) 200-200-20 MG/5ML suspension 30 mL  30 mL Oral Q4H PRN Sanjuana Kava, NP      . cephALEXin (KEFLEX) capsule 500 mg  500 mg Oral QID Alyson Kuroski-Mazzei, DO   500 mg at 03/25/12 0824  . chlorproMAZINE (THORAZINE) tablet 25 mg  25 mg Oral TID Alyson Kuroski-Mazzei, DO   25 mg at 03/25/12 0824  . feeding supplement (ENSURE COMPLETE) liquid 237 mL  237 mL Oral QPC lunch Anastasio Champion, RD   237 mL at 03/24/12 1300  . ferrous sulfate tablet 325 mg  325 mg Oral BID WC Sanjuana Kava, NP   325 mg at 03/25/12 1610  . hydrocortisone cream 0.5 %   Topical QHS PRN Jorje Guild, PA-C      . hydrocortisone cream 1 %   Topical BID Alyson Kuroski-Mazzei, DO   1 application at 03/25/12 0823  . LORazepam (ATIVAN) tablet 1 mg  1 mg Oral Q6H PRN Alyson Kuroski-Mazzei, DO   1 mg at 03/25/12 0434   Or  . LORazepam (ATIVAN) injection 1 mg  1 mg Intramuscular Q6H PRN Alyson Kuroski-Mazzei, DO      . LORazepam (ATIVAN) tablet 2 mg  2 mg Oral Daily Alyson Kuroski-Mazzei, DO   2 mg at 03/25/12 0824  . mulitivitamin with minerals tablet 1 tablet  1 tablet Oral  Daily Sanjuana Kava, NP   1 tablet at 03/25/12 0824  . naproxen (NAPROSYN) tablet 500 mg  500 mg Oral BID WC Alyson Kuroski-Mazzei, DO   500 mg at 03/25/12 0823  . pantoprazole (PROTONIX) EC tablet 20 mg  20 mg Oral Q1200 Alyson Kuroski-Mazzei, DO   20 mg at 03/24/12 1144  . sulfamethoxazole-trimethoprim (BACTRIM DS) 800-160 MG per tablet 1 tablet  1 tablet Oral Q12H Alyson Kuroski-Mazzei, DO   1 tablet at 03/25/12 0823  . thiamine (VITAMIN B-1) tablet 100 mg  100 mg Oral Daily Sanjuana Kava, NP   100 mg at 03/25/12 0823  . traZODone (DESYREL) tablet 200 mg  200 mg Oral QHS Jorje Guild, PA-C      . DISCONTD: traZODone (DESYREL) tablet 100 mg  100 mg Oral QHS Sanjuana Kava, NP   100 mg at 03/24/12 2127    Lab Results: No results found for this or  any previous visit (from the past 48 hour(s)).  Physical Findings: AIMS:  , ,  ,  ,    CIWA:  CIWA-Ar Total: 0  COWS:     Treatment Plan Summary: Daily contact with patient to assess and evaluate symptoms and progress in treatment Medication management  Plan: We will discontinue his one-to-one as he is showing no further indication of harming himself or others. We will increase his trazodone to 200 mg at bedtime, and order a hydrocortisone cream 0.5% to be used at bedtime. According to case management documentation, he is scheduled to the transported to [facility] in 2 days.  [name] 03/25/2012, 10:17 AM

## 2012-03-26 DIAGNOSIS — F10939 Alcohol use, unspecified with withdrawal, unspecified: Secondary | ICD-10-CM

## 2012-03-26 DIAGNOSIS — F10239 Alcohol dependence with withdrawal, unspecified: Secondary | ICD-10-CM
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
## 2012-03-26 NOTE — Progress Notes (Signed)
[facility] Group Notes:  (Counselor/Nursing/MHT/Case Management/Adjunct)  03/26/2012 2:19 PM  Type of Therapy:  Group Therapy at 11AM and 1:15PM  Participation Level:  Did Not Attend   Clide Dales 03/26/2012, 2:19 PM

## 2012-03-26 NOTE — Progress Notes (Addendum)
Patient ID: [name], male   DOB: Jan 27, 1956, 56 y.o.   MRN: [id number]  [facility] MD Progress Note                                         03/26/2012    [name] September 30, 1956    [id number] Hospital day #6  [id number] Dependence & W/D; Hyponatremia; Malnutrition; Normocytic Anemia; Intractable Hiccups; Psoriasis, per Hx; Chronic Pain s/p SIB; GERD; HepC; Cirrhosis, per Hx; PD NOS with Cluster B Traits  The patient was seen today and reports the following:  Sleep: "poor" Appetite: poor  Mild>(1-10) >Severe  Hopelessness (1-10): "x 12 years" Depression (1-10): 8/10 Anxiety (1-10): 10/10 Suicidal Ideation: Pt. Denies Suicidal ideation today. Plan: None Intent: None Means:  None Homicidal Ideation: Pt. Denies Homicidal ideation. Plan: None Intent: None Means: None  Eye Contact: Good.  General Appearance/Behavior: cooperative Motor Behavior: normal Speech: clear and coherent  Mental Status: oriented x 3 Level of Consciousness:  Alert Mood: depressed Affect:congruent  Thought Process: Linear Thought Content: w/o AH , but states he occasionally has "flashes" out of the corner of his eye. Perception: intact  Judgment:poor Insight: lacking Cognition: average  Sleep: Number of Hours: poor   Temp:  [96.9 F (36.1 C)-97 F (36.1 C)] 96.9 F (36.1 C) (06/10 0600) Pulse Rate:  [76-92] 87  (06/10 0601) Cardiac Rhythm:  [-]  Resp:  [16] 16  (06/10 0600) BP: (86-119)/(50-68) 86/53 mmHg (06/10 0601)                      . cephALEXin  500 mg Oral QID  . chlorproMAZINE  25 mg Oral TID  . feeding supplement  237 mL Oral QPC lunch  . ferrous sulfate  325 mg Oral BID WC  . hydrocortisone cream   Topical BID  . LORazepam  2 mg Oral Daily  . multivitamin with minerals  1 tablet Oral Daily  . naproxen  500 mg Oral BID WC  . pantoprazole  20 mg Oral Q1200  . sulfamethoxazole-trimethoprim  1 tablet Oral Q12H  . thiamine  100 mg Oral Daily  . traZODone  200 mg Oral QHS      No results found for this or any previous visit (from the past 48 hour(s)).   ROS:    Constitutional: WDWN AAF NAD   GI: Negative for N,V,D,C   Neuro: Negative for dizziness, blurred vision, visual changes, headaches   Resp: Negative for wheezing, SOB, cough   Cardio: Negative for CP, diaphoresis, fatigue   MSK: Negative for joint pain, swelling, DROM, or ambulatory difficulties.  Physical findings:  The patient was seen today and evaluated for his abdominal wound. He is currently on Keflex 500mg  QID, and Septra DS BID for 10 day course.  He has remained afebrile and the wound has been cleaned and bandage changed daily when the patient would allow.  Today, his abdomen is healing well.  The incisions are still draining with clear serosanguineous drainage.  There are two small areas of keloid formation on the right side of the abdomen.  There is no odor, or purulent drainage.  No increasing areas of erythema and no edema. Dressing is changed without difficulty.  Assessment: Abdominal wound is healing well, no signs of acute cellulitis, or purulent drainage.  Plan: 1. Pt. Will continue current plan of care. 2. MD will see and complete  SRA for dischare tomorrow to [employer]. 3. No change in medications at this time. 4. Pt. Will be picked up for transport tomorrow between 8 AM and 9 AM..  [name]. [name] PAC

## 2012-03-26 NOTE — Progress Notes (Signed)
Pt remains about the same.  He refuses to fill out his self inventory but will answer some questions about how he is feeling.  He admitted to depression and anxiety and still having some passive S/I and does contract for safety on the unit.  He is still demanding to go to "[facility]".  It seems that he has a bed and some paper work is on his chart in preparation of his discharge.   No prn meds today thus far.

## 2012-03-26 NOTE — Discharge Planning (Signed)
Faxed PA note to [facility] and left messages for both PA and admissions office stating we are sending Jaylenn tomorrow unless we hear otherwise.  Sent IVC and transpo. Request to Magistrate.  Called sheriff to request ride for AM.

## 2012-03-26 NOTE — Progress Notes (Signed)
[facility] Case Management Discharge Plan:  Will you be returning to the same living situation after discharge: No. At discharge, do you have transportation home?:Yes,  sheriff Do you have the ability to pay for your medications:Yes,  Mental health  Interagency Information:     Release of information consent forms completed and in the chart;  Patient's signature needed at discharge.  Patient to Follow up at:  Follow-up Information    Follow up with [facility] on 03/27/2012. (Sheriff to transport Tues AM)    Contact information:   [place], [place]  [919] [phone]         Patient denies SI/HI:   Yes,  yes    Safety Planning and Suicide Prevention discussed:  Yes,  yes  Barrier to discharge identified:No.  Summary and Recommendations:   [name] 03/26/2012, 5:46 PM

## 2012-03-27 DIAGNOSIS — X789XXA Intentional self-harm by unspecified sharp object, initial encounter: Secondary | ICD-10-CM

## 2012-03-27 DIAGNOSIS — F10229 Alcohol dependence with intoxication, unspecified: Secondary | ICD-10-CM

## 2012-03-27 DIAGNOSIS — T8131XA Disruption of external operation (surgical) wound, not elsewhere classified, initial encounter: Secondary | ICD-10-CM

## 2012-03-27 MED ORDER — CEPHALEXIN 500 MG PO CAPS: 500.0000 mg | ORAL_CAPSULE | Freq: Four times a day (QID) | ORAL | Status: DC

## 2012-03-27 MED ORDER — CEPHALEXIN 500 MG PO CAPS: 500.0000 mg | ORAL_CAPSULE | Freq: Four times a day (QID) | ORAL | Status: AC
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
## 2012-03-27 NOTE — Progress Notes (Signed)
Patient ID: [name], male   DOB: [birth year], 56 y.o.   MRN: [id number] He has been up and about and to group. Interacting with peers and staff. Discharged to [facility] was picked up be [employer].  Pt voiced understanding of discharge instruction, all belonging taken with him.

## 2012-03-27 NOTE — Progress Notes (Signed)
Patient ID: [name], male   DOB: [birth year], 56 y.o.   MRN: [id number] Pt. Reports anxiety/depression at "8" of 10. Pt. Wants to go to [name]. Reviewed pt. Orders pt. For discharge to [facility] tomorrow. Pt. Denies SHI. Staff will monitor q31min for safety.

## 2012-03-28 ENCOUNTER — Encounter (HOSPITAL_COMMUNITY): Payer: Self-pay | Admitting: Emergency Medicine

## 2012-03-28 NOTE — Progress Notes (Signed)
Patient Discharge Instructions:  After Visit Summary (AVS):   Faxed to:  03/28/2012 Psychiatric Admission Assessment Note:   Faxed to:  03/20/2012 Suicide Risk Assessment - Discharge Assessment:   Faxed to:  03/28/2012 Faxed/Sent to the Next Level Care provider:  03/28/2012  Faxed to [facility] [place] @ [phone]  Heloise Purpura Eduard Clos, 03/28/2012, 4:19 PM

## 2012-03-30 IMAGING — CR DG ABDOMEN ACUTE W/ 1V CHEST
3 series · 3 of 3 positions shown · non-contrast
Comparison: 03/03/2010

CLINICAL DATA: Abdominal pain.  Nausea vomiting.

ACUTE ABDOMEN SERIES (ABDOMEN 2 VIEW & CHEST 1 VIEW)

[w chest pa]
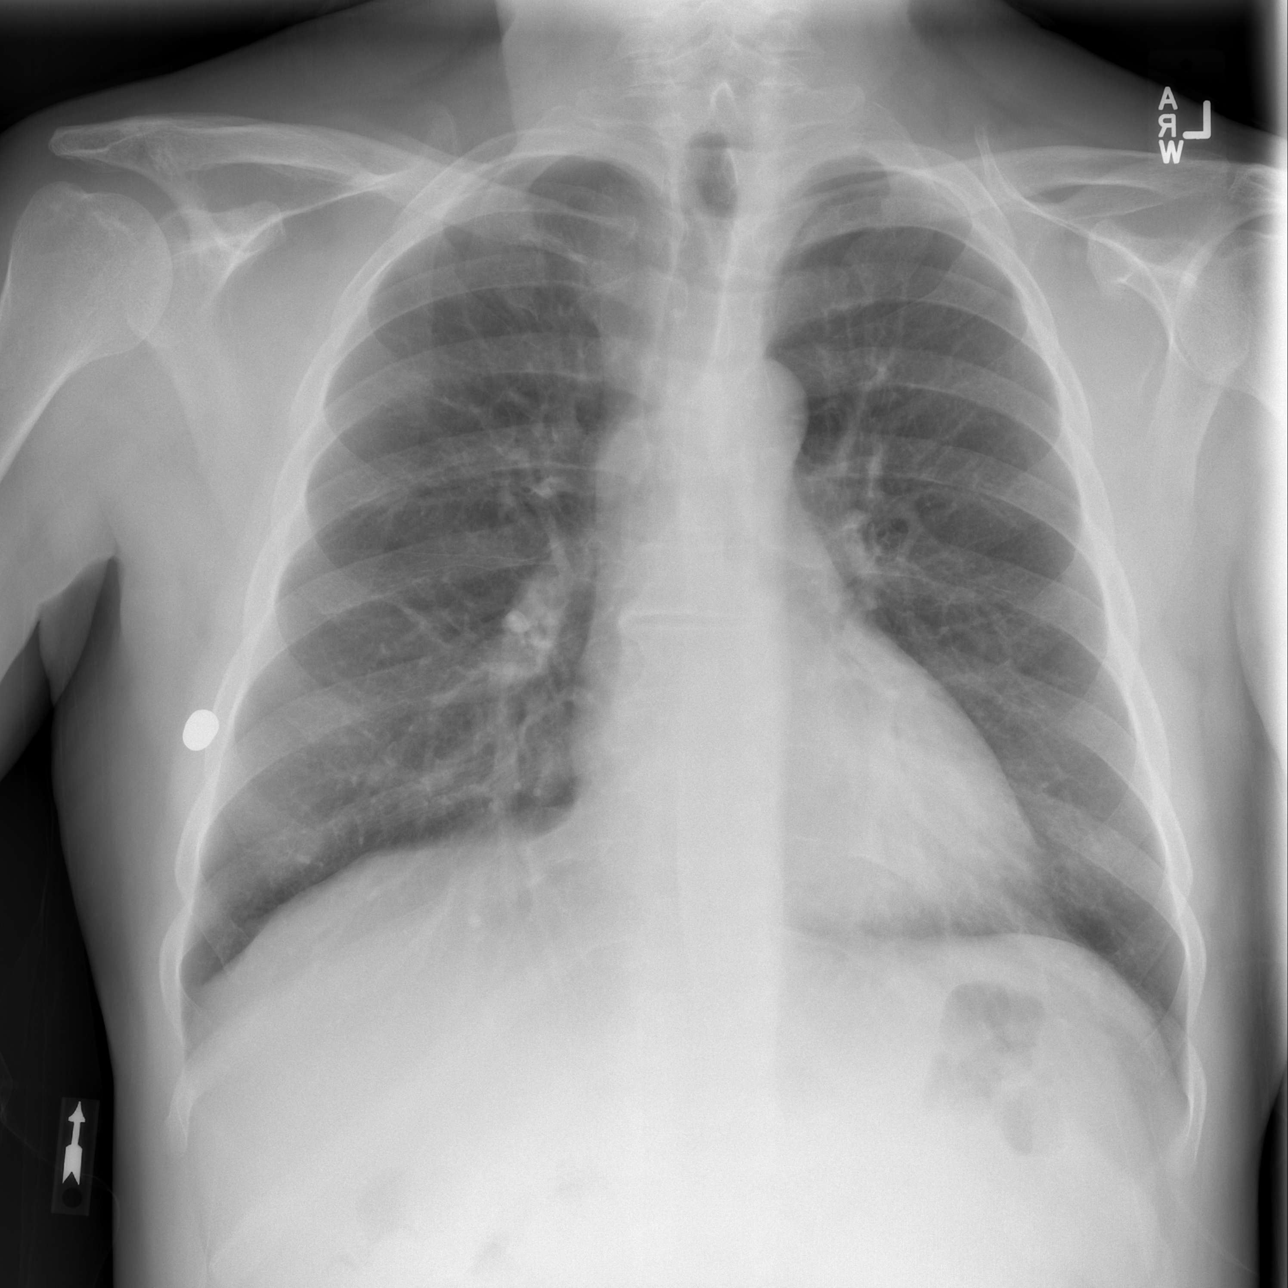

[w abdomen upright *]
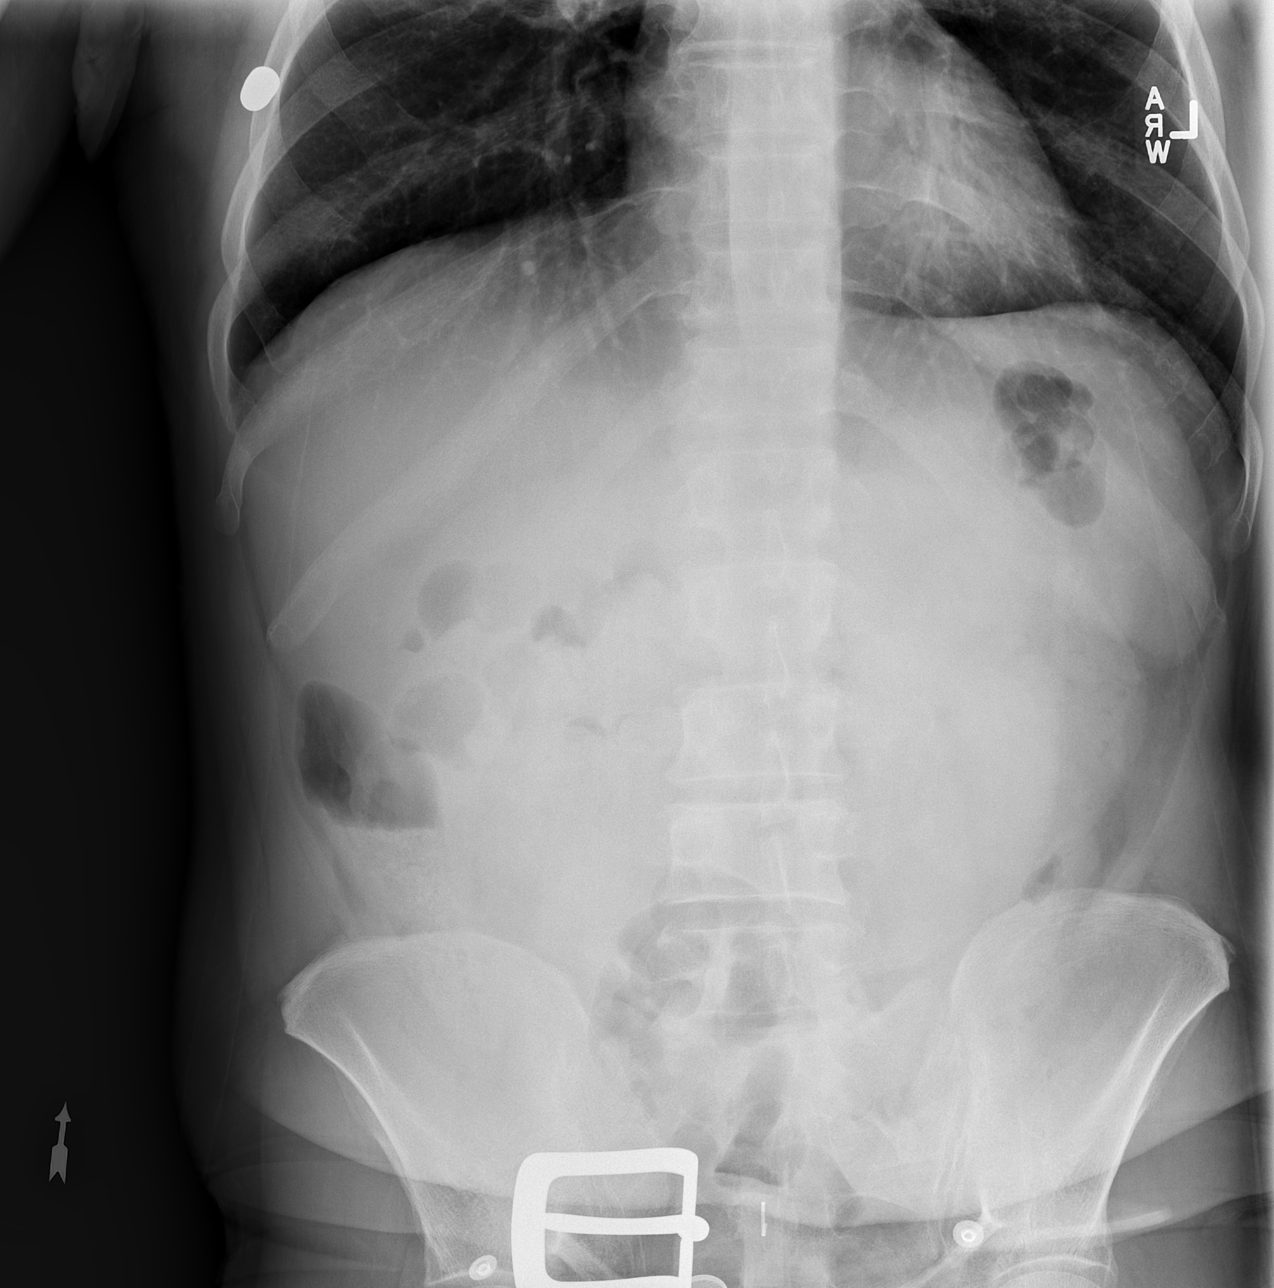

[t abdomen supine]
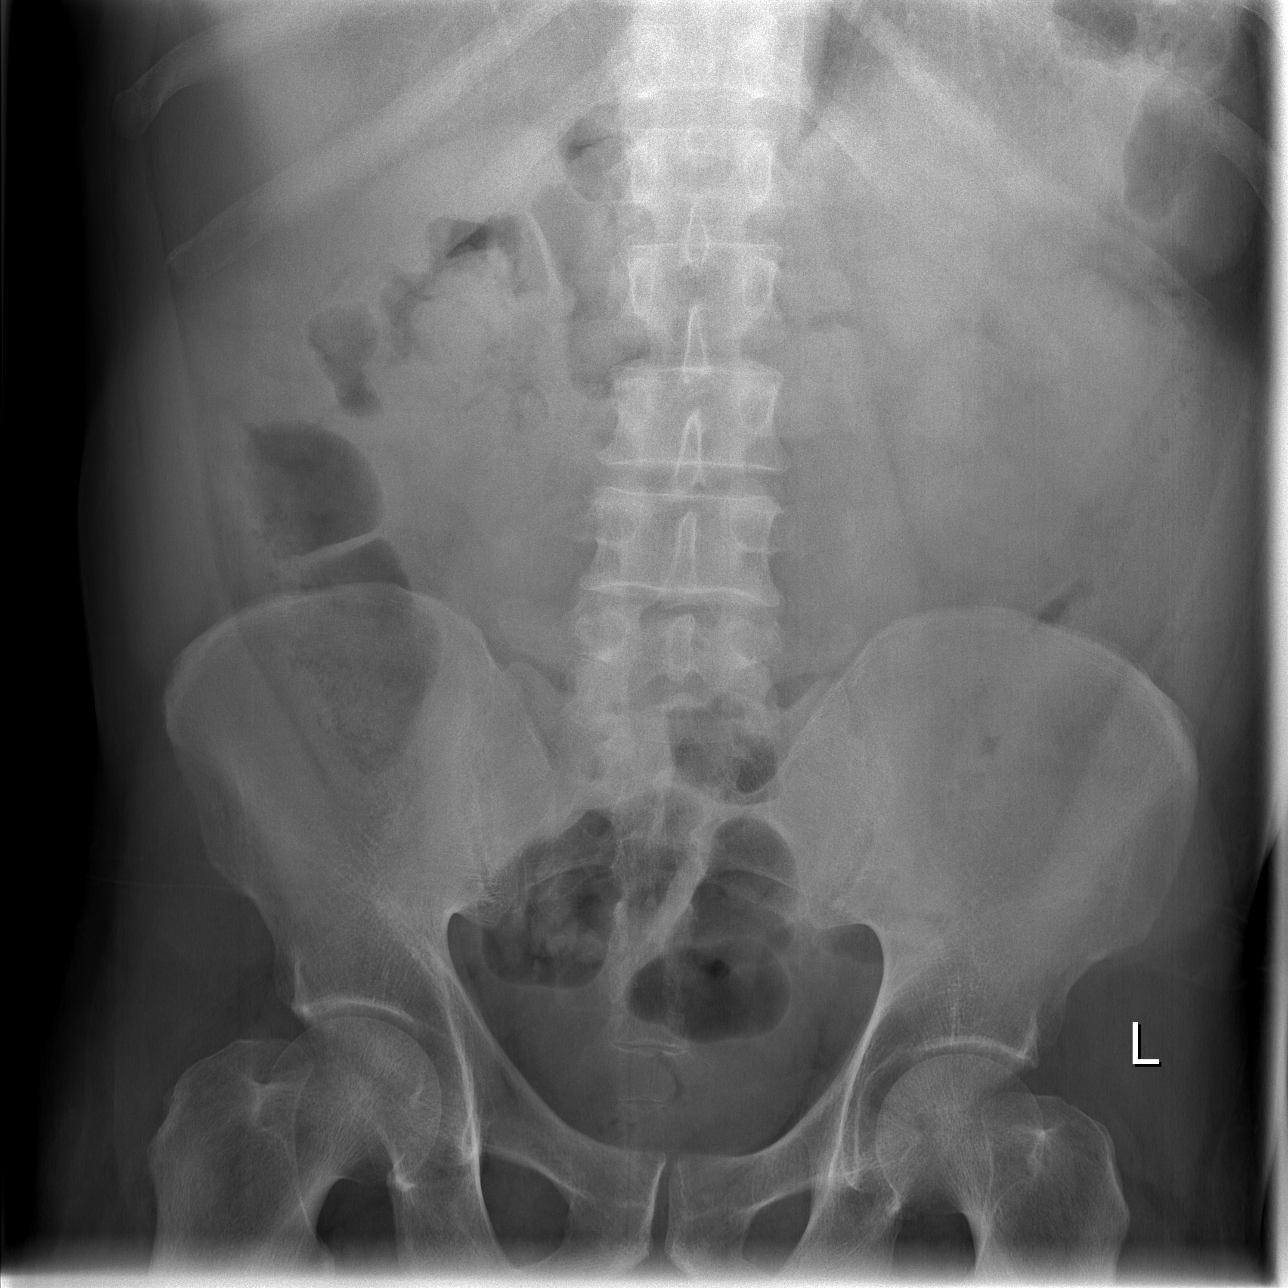

[3 of 3 positions shown; findings below may reference images not displayed]

FINDINGS: Frontal view of the chest demonstrates midline trachea.
Normal heart size and mediastinal contours. No pleural effusion or
pneumothorax.  Clear lungs.

Abominal films demonstrate no free intraperitoneal air.  No
significant air fluid levels on upright positioning.

Supine view demonstrates no bowel distention. No abnormal abdominal
calcifications.   No appendicolith.
IMPRESSION: No acute findings.
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
## 2013-04-30 IMAGING — CR DG ABDOMEN ACUTE W/ 1V CHEST
3 series · 3 of 3 positions shown · non-contrast
Comparison: None.

CLINICAL DATA: Vomiting blood

ACUTE ABDOMEN SERIES (ABDOMEN 2 VIEW & CHEST 1 VIEW)

[w chest pa]
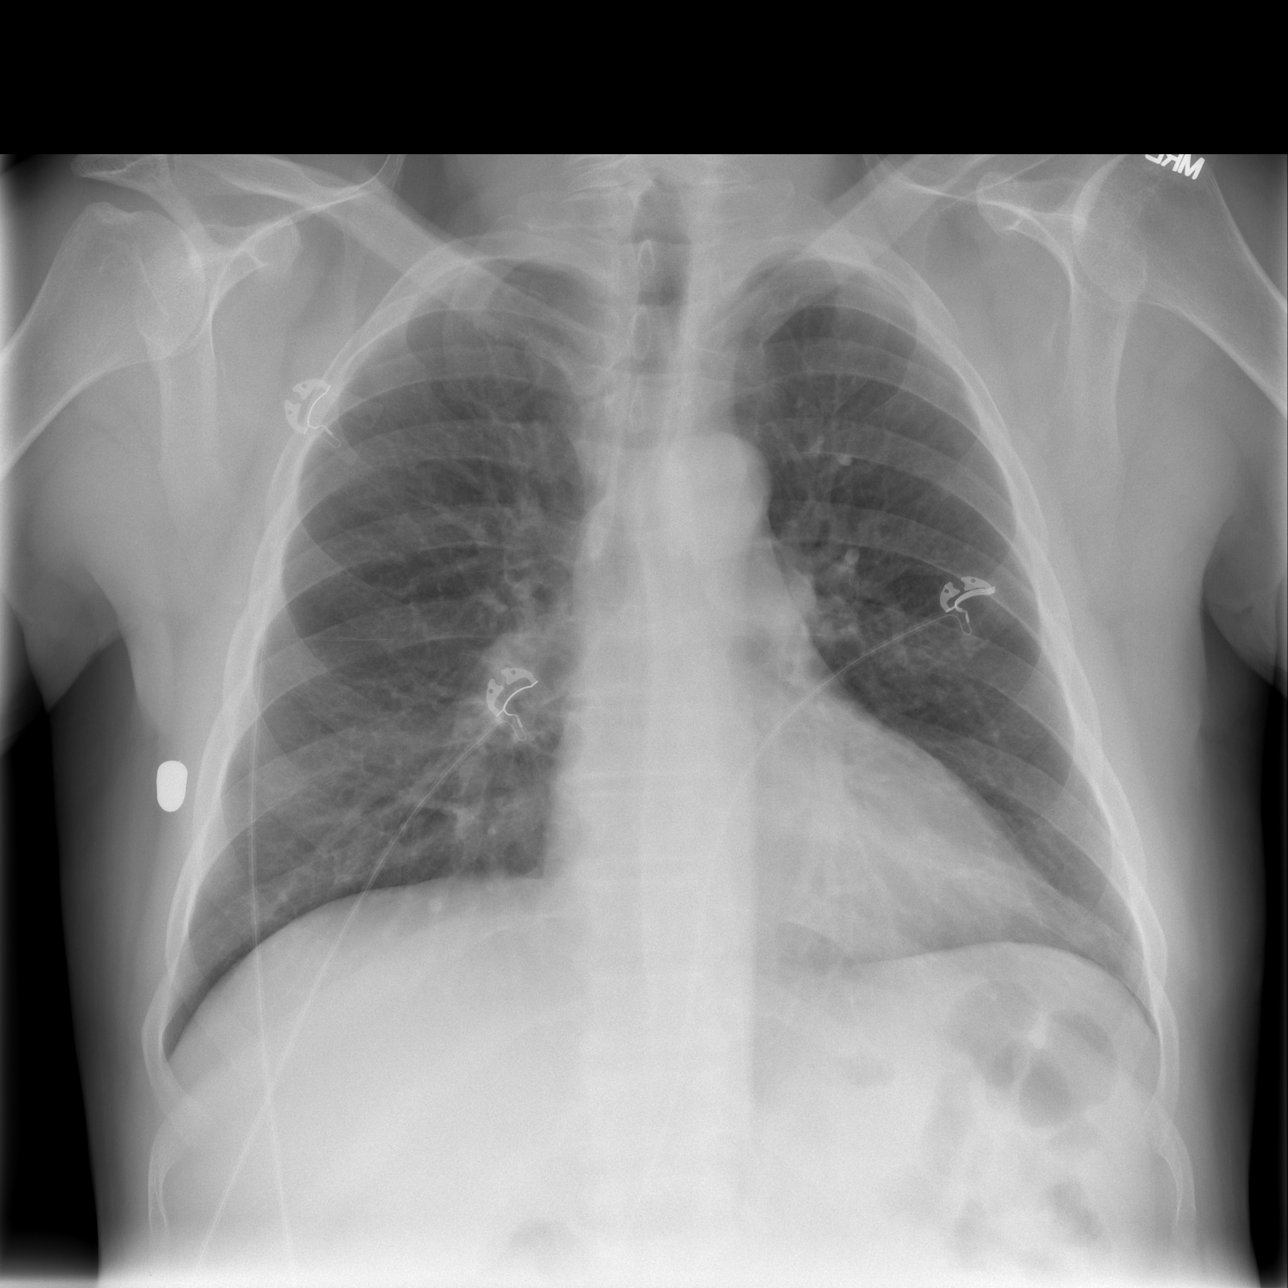

[w abdomen upright *]
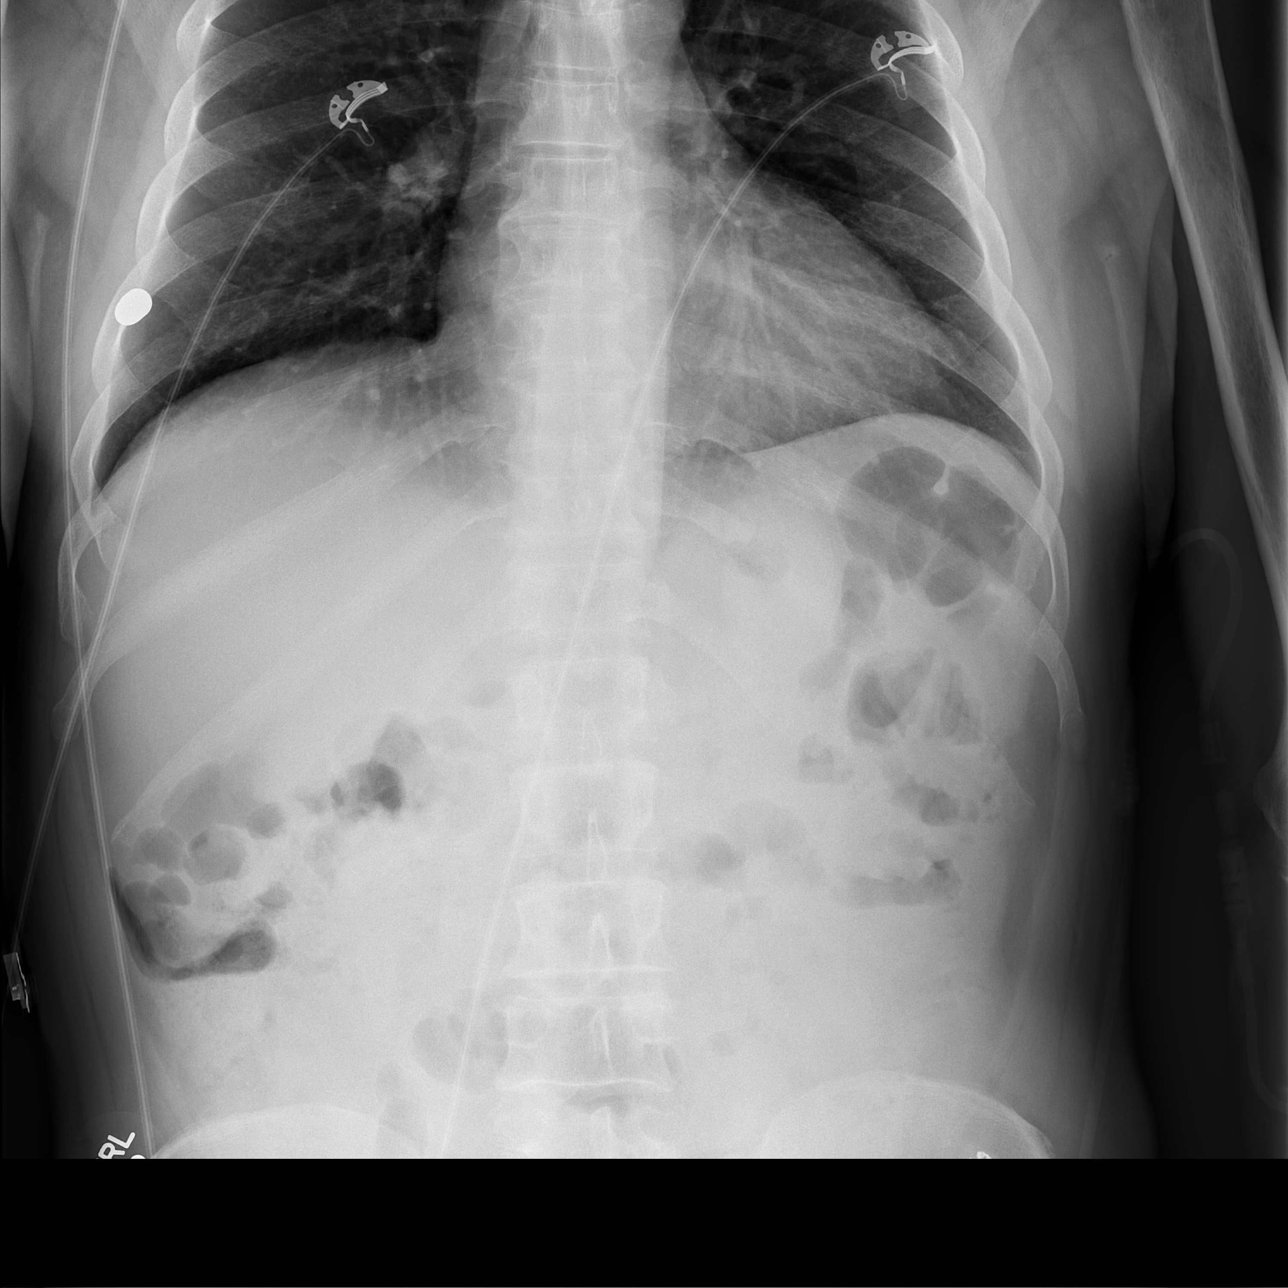

[t abdomen supine]
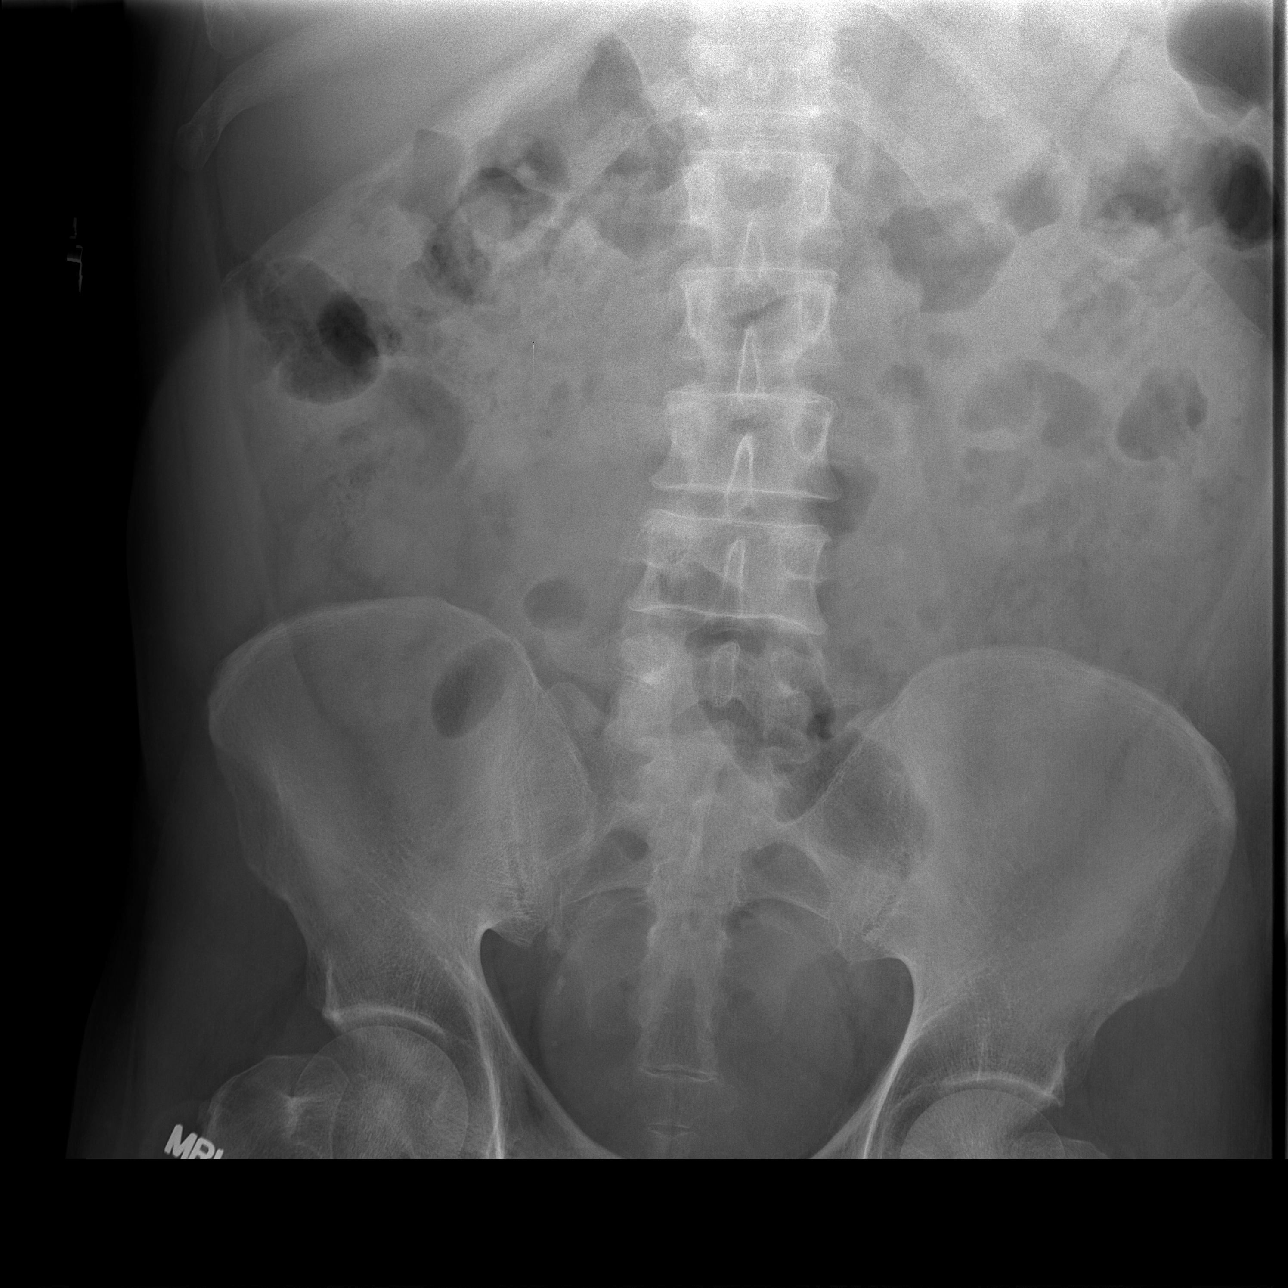

[3 of 3 positions shown; findings below may reference images not displayed]

FINDINGS: Normal heart and mediastinal contours. Lungs are clear.
No effusion or pneumothorax. Nonobstructive bowel gas pattern. The
bones appear normal.
IMPRESSION: Nonobstructive bowel gas pattern. No acute cardiopulmonary disease.

## 2013-04-30 IMAGING — CR DG HAND COMPLETE 3+V*R*
3 series · 3 of 3 positions shown · non-contrast
Comparison: 03/18/2011

CLINICAL DATA: Right hand.  Recent fracture of the first
metacarpal.

RIGHT HAND - COMPLETE 3+ VIEW

[x hand ap right]
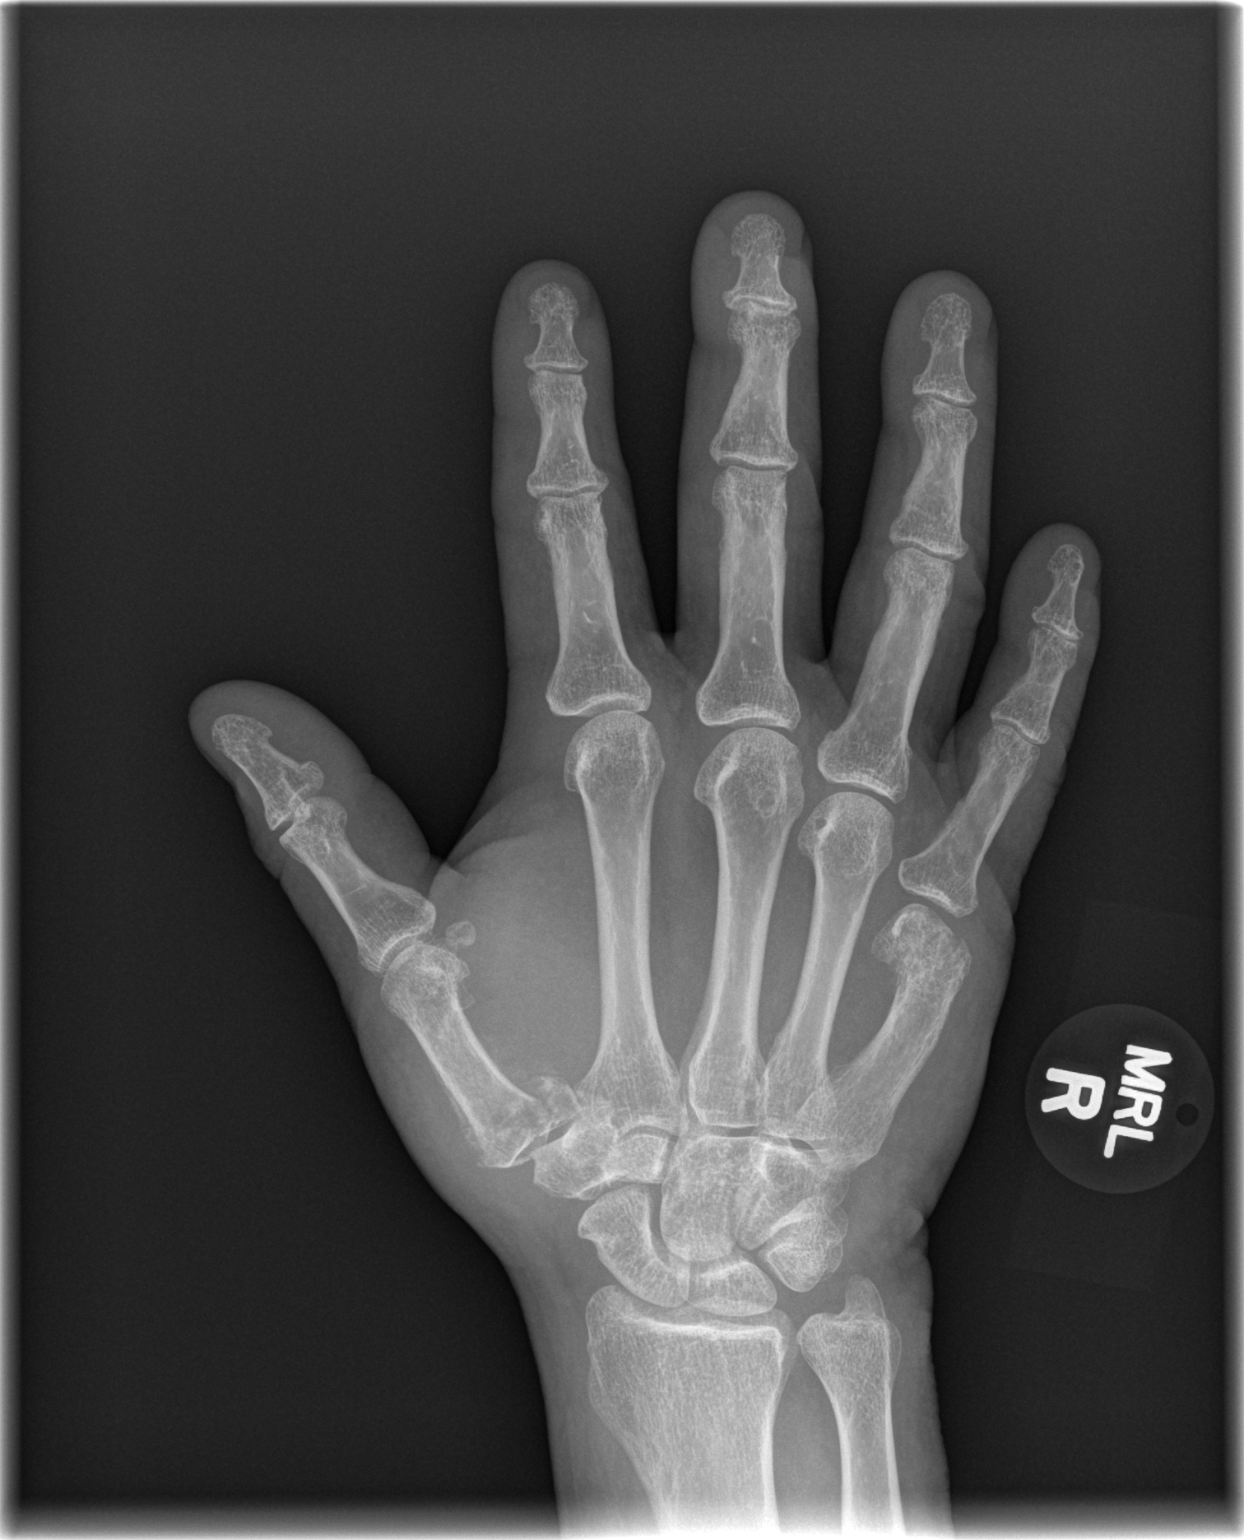

[x hand lat right]
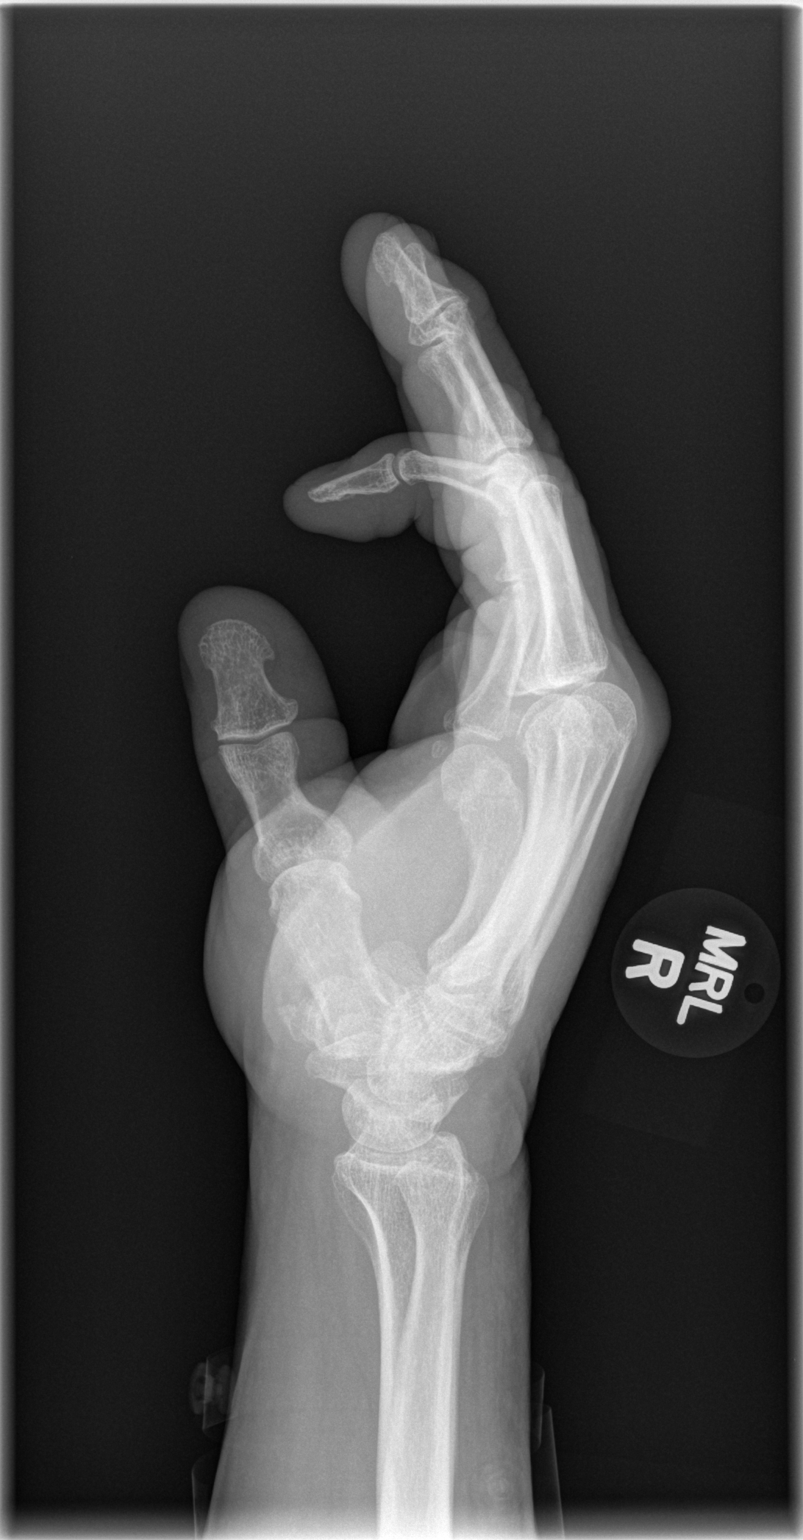

[x hand oblique right]
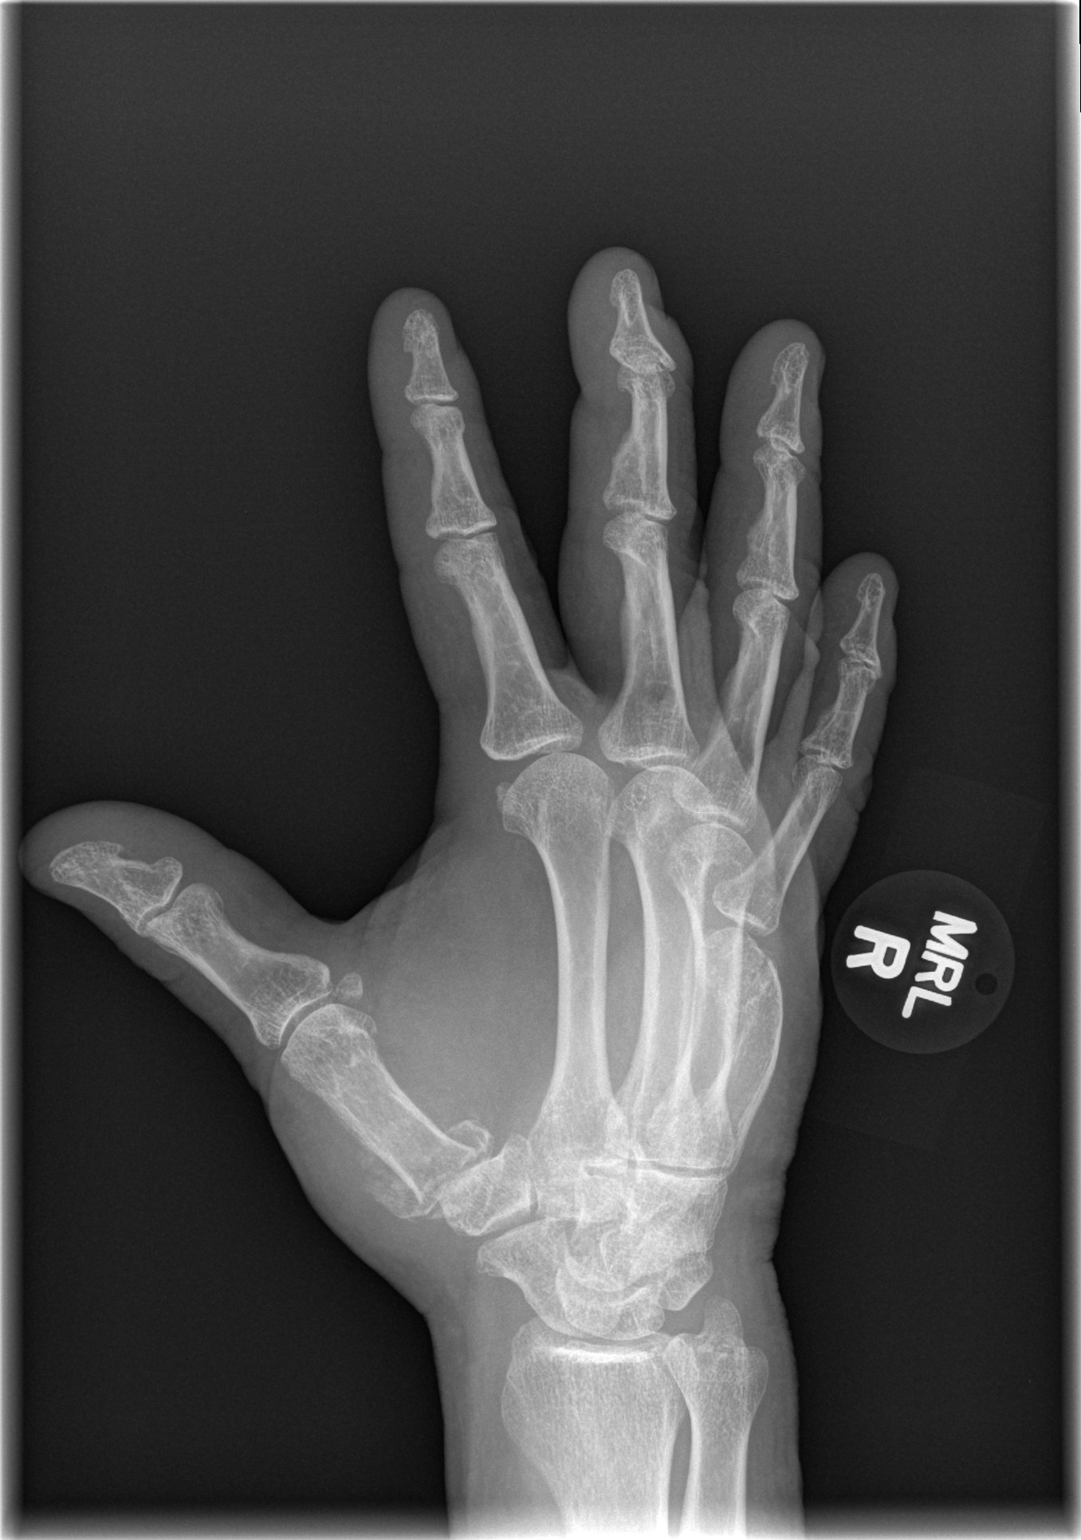

[3 of 3 positions shown; findings below may reference images not displayed]

FINDINGS: There is a healing fracture of the base of the proximal
first metacarpal. The fragments are displaced and there is
subluxation at the first carpal metacarpal joint.

The patient now has an acute fracture superimposed on an old
deformity of the head of the fifth metacarpal.  This is new since
the prior study.
IMPRESSION: 1.  New fracture of the head of the fifth metacarpals superimposed
on an old fracture deformity.
2.  Interval slight healing of the fifth comminuted fracture of the
base of the first metacarpal.  Fracture fragments are displaced and
there is subluxation at the fracture site.
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
## 2014-03-02 IMAGING — CR DG CHEST 1V PORT
1 series · 1 of 1 positions shown · non-contrast
Comparison: 02/08/2012

CLINICAL DATA: Respiratory distress

PORTABLE CHEST - 1 VIEW

[view not recorded]
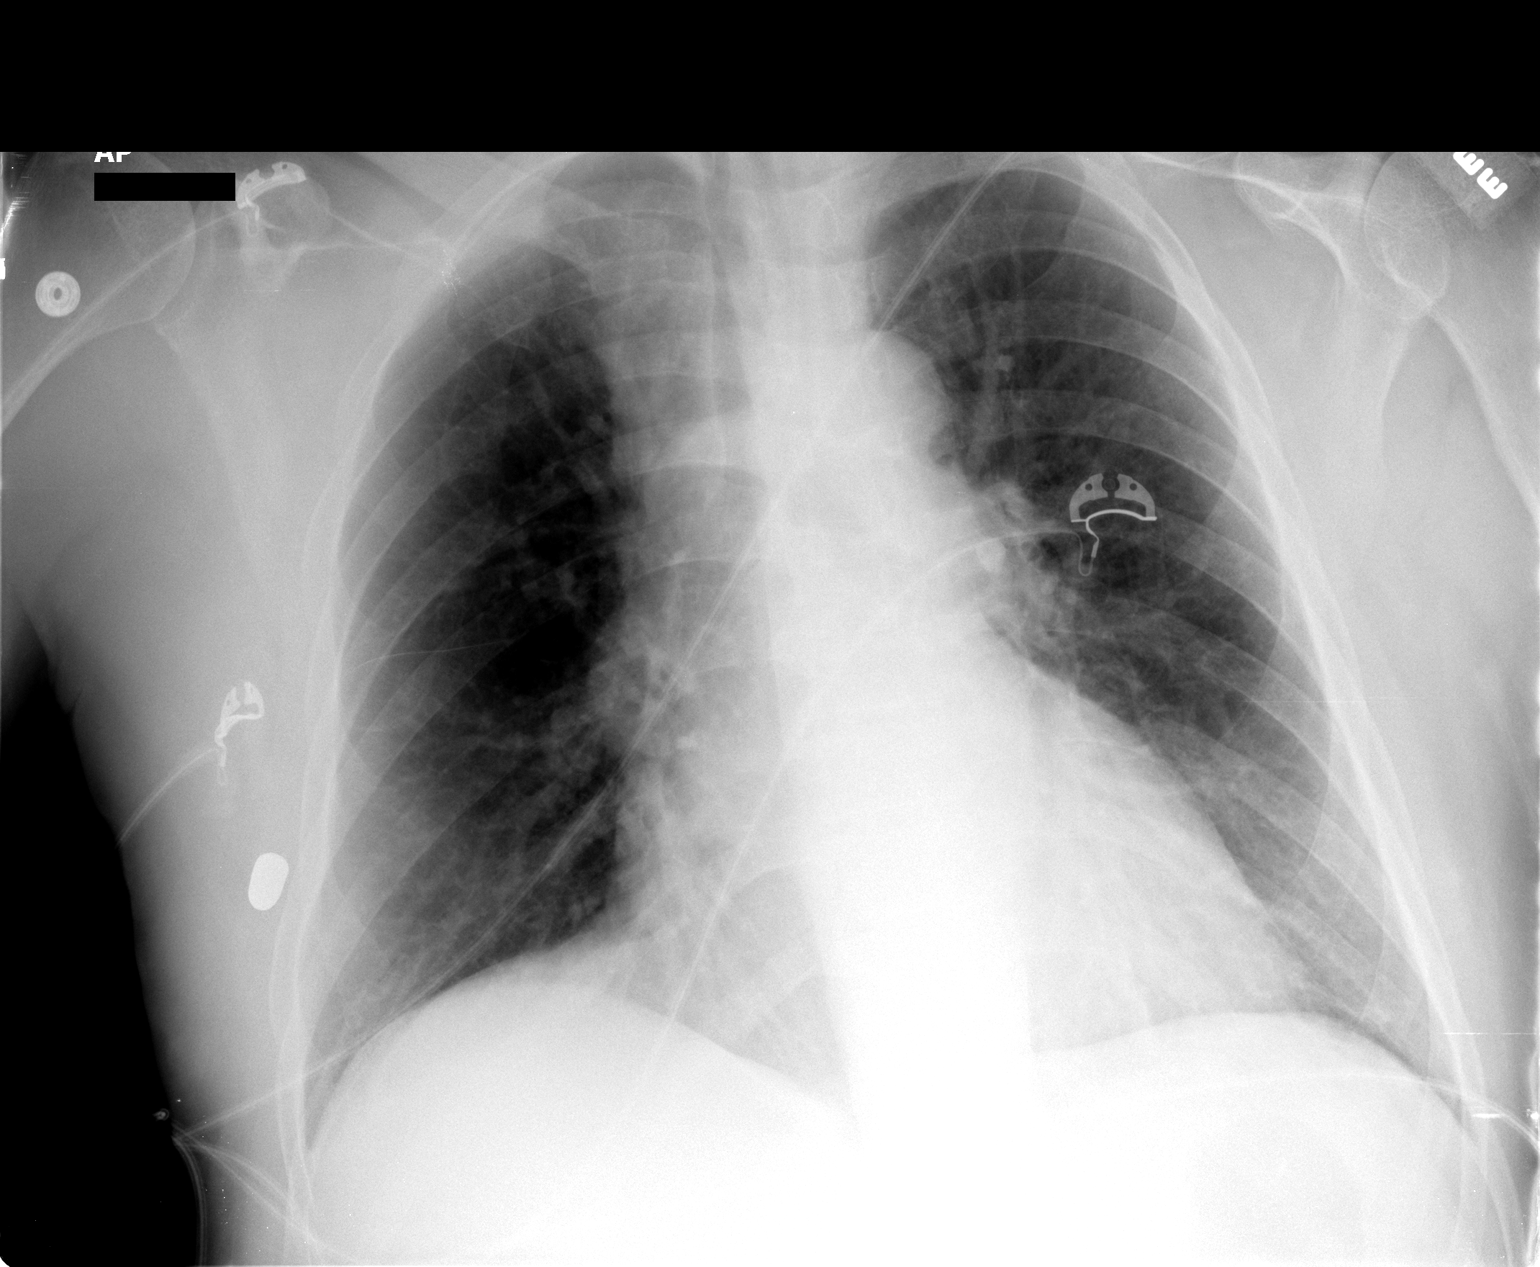

[1 of 1 positions shown; findings below may reference images not displayed]

FINDINGS: Heart size and pulmonary vascularity are normal for
technique.  Probable emphysematous changes in the upper lungs.  No
focal airspace consolidation in the lungs.  No blunting of
costophrenic angles.  No pneumothorax.  No significant changes
since the previous study.
IMPRESSION: No evidence of active pulmonary disease.
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
## 2020-11-16 ENCOUNTER — Emergency Department (HOSPITAL_COMMUNITY)
Admission: EM | Admit: 2020-11-16 | Discharge: 2020-11-16 | Disposition: A | Discharge status: Home / Self Care | Payer: Medicaid Other | Attending: Emergency Medicine | Admitting: Emergency Medicine

## 2020-11-16 ENCOUNTER — Ambulatory Visit (HOSPITAL_COMMUNITY)
Admission: EM | Admit: 2020-11-16 | Discharge: 2020-11-16 | Disposition: A | Discharge status: Other Acute Inpatient Hospital | Payer: Medicaid Other

## 2020-11-16 ENCOUNTER — Encounter (HOSPITAL_COMMUNITY): Payer: Self-pay | Admitting: Emergency Medicine

## 2020-11-16 ENCOUNTER — Other Ambulatory Visit: Payer: Self-pay

## 2020-11-16 DIAGNOSIS — Z5321 Procedure and treatment not carried out due to patient leaving prior to being seen by health care provider: Secondary | ICD-10-CM | POA: Diagnosis not present

## 2020-11-16 DIAGNOSIS — R45851 Suicidal ideations: Secondary | ICD-10-CM | POA: Diagnosis not present

## 2020-11-16 DIAGNOSIS — R451 Restlessness and agitation: Secondary | ICD-10-CM | POA: Diagnosis not present

## 2020-11-16 LAB — SALICYLATE LEVEL: Salicylate Lvl: <7 mg/dL — ABNORMAL LOW (ref 7.0–30.0)

## 2020-11-16 LAB — COMPREHENSIVE METABOLIC PANEL
ALT: 19 U/L (ref 0–44)
AST: 24 U/L (ref 15–41)
Albumin: 3.6 g/dL (ref 3.5–5.0)
Alkaline Phosphatase: 61 U/L (ref 38–126)
Anion gap: 9 (ref 5–15)
BUN: 7 mg/dL — ABNORMAL LOW (ref 8–23)
CO2: 25 mmol/L (ref 22–32)
Calcium: 8.8 mg/dL — ABNORMAL LOW (ref 8.9–10.3)
Chloride: 106 mmol/L (ref 98–111)
Creatinine, Ser: 0.82 mg/dL (ref 0.61–1.24)
GFR, Estimated: >60 mL/min (ref >60)
Glucose, Bld: 89 mg/dL (ref 70–99)
Potassium: 3.8 mmol/L (ref 3.5–5.1)
Sodium: 140 mmol/L (ref 135–145)
Total Bilirubin: 1.2 mg/dL (ref 0.3–1.2)
Total Protein: 7.5 g/dL (ref 6.5–8.1)

## 2020-11-16 LAB — CBC
HCT: 42.7 % (ref 39.0–52.0)
Hemoglobin: 13.4 g/dL (ref 13.0–17.0)
MCH: 25.7 pg — ABNORMAL LOW (ref 26.0–34.0)
MCHC: 31.4 g/dL (ref 30.0–36.0)
MCV: 82 fL (ref 80.0–100.0)
Platelets: 120 10*3/uL — ABNORMAL LOW (ref 150–400)
RBC: 5.21 MIL/uL (ref 4.22–5.81)
RDW: 18.6 % — ABNORMAL HIGH (ref 11.5–15.5)
WBC: 6.9 10*3/uL (ref 4.0–10.5)
nRBC: 0 % (ref 0.0–0.2)

## 2020-11-16 LAB — ACETAMINOPHEN LEVEL: Acetaminophen (Tylenol), Serum: <10 ug/mL — ABNORMAL LOW (ref 10–30)

## 2020-11-16 LAB — ETHANOL: Alcohol, Ethyl (B): 180 mg/dL — ABNORMAL HIGH (ref <10)

## 2020-11-16 NOTE — ED Provider Notes (Signed)
[facility] Medical Screening Exam  Patient Name: [name] MRN: [id number] Date of Evaluation: 11/16/20 Chief Complaint:   Diagnosis:  Final diagnoses:  None    History of Present illness: [name] is a 65 y.o. male.  presents to [facility] under involuntary commitment.  Was reported patient was recently discharged from prison where he was incarcerated for the past 11 years.  Patient reports suicidal ideations with a plan to cut his stomach open.  He reports a history of cancer and feels as if the cancer is causing "gangrene and it is painful." [name] lifted his shirt, old scars noted.  4 x 4 gauze noted (epigastric area) clean, dry and intact.  patient presents labile, irritable and aggressive.  Patient to be transferred to the local emergency department for medical clearance.   Psychiatric Specialty Exam  Presentation  General Appearance:Appropriate for Environment  Eye Contact:Good  Speech:Clear and Coherent  Speech Volume:No data recorded Handedness:Left   Mood and Affect  Mood:Anxious; Depressed  Affect:Flat; Labile   Thought Process  Thought Processes:Coherent  Descriptions of Associations:Intact  Orientation:Full (Time, Place and Person)  Thought Content:Logical  Hallucinations:None  Ideas of Reference:None  Suicidal Thoughts:Yes, Active Without Intent  Homicidal Thoughts:No   Sensorium  Memory:Immediate Good; Recent Good; Remote Good  Judgment:Intact  Insight:Good   Executive Functions  Concentration:Fair  Attention Span:Fair  Recall:Fair  Fund of Knowledge:Good  Language:Good   Psychomotor Activity  Psychomotor Activity:Restlessness   Assets  Assets:Communication Skills; Desire for Improvement; Social Support   Sleep  Sleep:Fair  Number of hours: No data recorded  Physical Exam: Physical Exam ROS There were no vitals taken for this visit. There is no height or weight on file to calculate  BMI.  Musculoskeletal: Strength & Muscle Tone: within normal limits Gait & Station: normal Patient leans: N/A   [facility] MSE Discharge Disposition for Follow up and Recommendations: Based on my evaluation the patient appears to have an emergency medical condition for which I recommend the patient be transferred to the emergency department for further evaluation.    Oneta Rack, NP 11/16/2020, 3:12 PM

## 2020-11-16 NOTE — ED Triage Notes (Signed)
Pt BIB [employer], pt reports feeling suicidal, requesting help. States he drinks a case of beer every day, last drink earlier today, before arrival.

## 2020-11-16 NOTE — ED Triage Notes (Signed)
65 yo male bought in by [facility] complaining, "I just want to kill something. Send me back to the hotel where I came from so I can gut myself or someone. I just did 11 years in prison and they kicked me out. I don't know what I'm suppose to do. 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 You can't help me". Pt refused to give property to officers, refused to answer anymore questions. Refused care from provider. Demanding to leave and go to a "real hospital or take me back to the hotel". Pt referred to ED per NP.

## 2020-11-16 NOTE — ED Notes (Signed)
Pt becoming agitated, states he doesn't want to be seen. Pt voluntary at this time. [employer] present. Dr. Rodena Medin present in triage. Pt refusing to speak to EDP.

## 2020-11-24 ENCOUNTER — Encounter (HOSPITAL_COMMUNITY): Payer: Self-pay

## 2020-11-24 ENCOUNTER — Emergency Department (HOSPITAL_COMMUNITY): Payer: Medicaid Other

## 2020-11-24 ENCOUNTER — Other Ambulatory Visit: Payer: Self-pay

## 2020-11-24 ENCOUNTER — Emergency Department (HOSPITAL_COMMUNITY)
Admission: EM | Admit: 2020-11-24 | Discharge: 2020-11-26 | Disposition: A | Discharge status: Home / Self Care | Payer: Medicaid Other | Attending: Emergency Medicine | Admitting: Emergency Medicine

## 2020-11-24 DIAGNOSIS — F1020 Alcohol dependence, uncomplicated: Secondary | ICD-10-CM | POA: Diagnosis not present

## 2020-11-24 DIAGNOSIS — Z20822 Contact with and (suspected) exposure to covid-19: Secondary | ICD-10-CM | POA: Diagnosis not present

## 2020-11-24 DIAGNOSIS — I10 Essential (primary) hypertension: Secondary | ICD-10-CM | POA: Diagnosis not present

## 2020-11-24 DIAGNOSIS — R451 Restlessness and agitation: Secondary | ICD-10-CM | POA: Insufficient documentation

## 2020-11-24 DIAGNOSIS — R45851 Suicidal ideations: Secondary | ICD-10-CM | POA: Insufficient documentation

## 2020-11-24 DIAGNOSIS — R1084 Generalized abdominal pain: Secondary | ICD-10-CM | POA: Insufficient documentation

## 2020-11-24 DIAGNOSIS — R0789 Other chest pain: Secondary | ICD-10-CM | POA: Diagnosis present

## 2020-11-24 DIAGNOSIS — F1024 Alcohol dependence with alcohol-induced mood disorder: Secondary | ICD-10-CM | POA: Diagnosis not present

## 2020-11-24 LAB — CBC
HCT: 43.5 % (ref 39.0–52.0)
Hemoglobin: 14 g/dL (ref 13.0–17.0)
MCH: 26.2 pg (ref 26.0–34.0)
MCHC: 32.2 g/dL (ref 30.0–36.0)
MCV: 81.3 fL (ref 80.0–100.0)
Platelets: 147 10*3/uL — ABNORMAL LOW (ref 150–400)
RBC: 5.35 MIL/uL (ref 4.22–5.81)
RDW: 18.9 % — ABNORMAL HIGH (ref 11.5–15.5)
WBC: 8.5 10*3/uL (ref 4.0–10.5)
nRBC: 0 % (ref 0.0–0.2)

## 2020-11-24 LAB — BASIC METABOLIC PANEL
Anion gap: 13 (ref 5–15)
BUN: 10 mg/dL (ref 8–23)
CO2: 19 mmol/L — ABNORMAL LOW (ref 22–32)
Calcium: 9.1 mg/dL (ref 8.9–10.3)
Chloride: 102 mmol/L (ref 98–111)
Creatinine, Ser: 0.69 mg/dL (ref 0.61–1.24)
GFR, Estimated: >60 mL/min (ref >60)
Glucose, Bld: 98 mg/dL (ref 70–99)
Potassium: 3.5 mmol/L (ref 3.5–5.1)
Sodium: 134 mmol/L — ABNORMAL LOW (ref 135–145)

## 2020-11-24 LAB — TROPONIN I (HIGH SENSITIVITY)
Troponin I (High Sensitivity): 8 ng/L (ref <18)
Troponin I (High Sensitivity): 5 ng/L (ref <18)

## 2020-11-24 LAB — RAPID URINE DRUG SCREEN, HOSP PERFORMED
Amphetamines: NOT DETECTED
Barbiturates: NOT DETECTED
Benzodiazepines: NOT DETECTED
Cocaine: NOT DETECTED
Opiates: NOT DETECTED
Tetrahydrocannabinol: NOT DETECTED

## 2020-11-24 LAB — URINALYSIS, ROUTINE W REFLEX MICROSCOPIC
Bilirubin Urine: NEGATIVE
Glucose, UA: NEGATIVE mg/dL
Hgb urine dipstick: NEGATIVE
Ketones, ur: NEGATIVE mg/dL
Leukocytes,Ua: NEGATIVE
Nitrite: NEGATIVE
Protein, ur: NEGATIVE mg/dL
Specific Gravity, Urine: 1.002 — ABNORMAL LOW (ref 1.005–1.030)
pH: 5 (ref 5.0–8.0)

## 2020-11-24 LAB — HEPATIC FUNCTION PANEL
ALT: 21 U/L (ref 0–44)
AST: 28 U/L (ref 15–41)
Albumin: 4 g/dL (ref 3.5–5.0)
Alkaline Phosphatase: 66 U/L (ref 38–126)
Bilirubin, Direct: 0.3 mg/dL — ABNORMAL HIGH (ref 0.0–0.2)
Indirect Bilirubin: 1.1 mg/dL — ABNORMAL HIGH (ref 0.3–0.9)
Total Bilirubin: 1.4 mg/dL — ABNORMAL HIGH (ref 0.3–1.2)
Total Protein: 8.3 g/dL — ABNORMAL HIGH (ref 6.5–8.1)

## 2020-11-24 LAB — RESP PANEL BY RT-PCR (FLU A&B, COVID) ARPGX2
Influenza A by PCR: NEGATIVE
Influenza B by PCR: NEGATIVE
SARS Coronavirus 2 by RT PCR: NEGATIVE

## 2020-11-24 LAB — LIPASE, BLOOD: Lipase: 36 U/L (ref 11–51)

## 2020-11-24 LAB — SALICYLATE LEVEL: Salicylate Lvl: <7 mg/dL — ABNORMAL LOW (ref 7.0–30.0)

## 2020-11-24 LAB — ETHANOL: Alcohol, Ethyl (B): 284 mg/dL — ABNORMAL HIGH (ref <10)

## 2020-11-24 LAB — ACETAMINOPHEN LEVEL: Acetaminophen (Tylenol), Serum: <10 ug/mL — ABNORMAL LOW (ref 10–30)

## 2020-11-24 MED ORDER — LORAZEPAM 2 MG/ML IJ SOLN: 0.0000 mg | Freq: Four times a day (QID) | INTRAMUSCULAR | Status: DC

## 2020-11-24 MED ORDER — LORAZEPAM 2 MG/ML IJ SOLN: 0.0000 mg | Freq: Two times a day (BID) | INTRAMUSCULAR | Status: DC

## 2020-11-24 MED ORDER — LORAZEPAM 1 MG PO TABS
0.0000 mg | ORAL_TABLET | Freq: Four times a day (QID) | ORAL | Status: DC
  Administered 2020-11-25: 2 mg via ORAL
  Filled 2020-11-24: qty 2

## 2020-11-24 MED ORDER — LORAZEPAM 2 MG/ML IJ SOLN
1.0000 mg | Freq: Once | INTRAMUSCULAR | Status: AC
  Administered 2020-11-24: 1 mg via INTRAVENOUS
  Filled 2020-11-24: qty 1

## 2020-11-24 MED ORDER — ONDANSETRON HCL 4 MG PO TABS: 4.0000 mg | ORAL_TABLET | Freq: Three times a day (TID) | ORAL | Status: DC | PRN

## 2020-11-24 MED ORDER — ALUM & MAG HYDROXIDE-SIMETH 200-200-20 MG/5ML PO SUSP: 30.0000 mL | Freq: Four times a day (QID) | ORAL | Status: DC | PRN

## 2020-11-24 MED ORDER — MELATONIN 5 MG PO TABS: 5.0000 mg | ORAL_TABLET | Freq: Every day | ORAL | Status: DC

## 2020-11-24 MED ORDER — BACITRACIN ZINC 500 UNIT/GM EX OINT: TOPICAL_OINTMENT | Freq: Two times a day (BID) | CUTANEOUS | Status: DC

## 2020-11-24 MED ORDER — THIAMINE HCL 100 MG/ML IJ SOLN: 100.0000 mg | Freq: Every day | INTRAMUSCULAR | Status: DC

## 2020-11-24 MED ORDER — LORAZEPAM 1 MG PO TABS: 0.0000 mg | ORAL_TABLET | Freq: Two times a day (BID) | ORAL | Status: DC

## 2020-11-24 MED ORDER — THIAMINE HCL 100 MG PO TABS
100.0000 mg | ORAL_TABLET | Freq: Every day | ORAL | Status: DC
Start: 2020-11-25 — End: 2020-11-26
  Administered 2020-11-25: 100 mg via ORAL
  Filled 2020-11-24: qty 1

## 2020-11-24 MED ORDER — IOHEXOL 300 MG/ML  SOLN
100.0000 mL | Freq: Once | INTRAMUSCULAR | Status: AC | PRN
  Administered 2020-11-24: 100 mL via INTRAVENOUS
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
## 2020-11-24 NOTE — ED Notes (Signed)
Pt frequently calling out asking for pain medicine, provider states she would like to hold off until getting the CT scan. Pt notified and offered non-pharmacologic measures.

## 2020-11-24 NOTE — ED Notes (Signed)
Made CT tech aware of pt's new IV. Pt updated on plan of care and awaiting CT results

## 2020-11-25 DIAGNOSIS — F1024 Alcohol dependence with alcohol-induced mood disorder: Secondary | ICD-10-CM

## 2020-11-25 NOTE — ED Notes (Signed)
Pt was given a snack and drink (cheese and crackers and sprite}

## 2020-11-25 NOTE — BH Assessment (Signed)
Comprehensive Clinical Assessment (CCA) Note  11/25/2020 [name] [id number]   [name] is a 65 year old male presenting voluntarily to [facility] due to HI and SI with a plan to overdose on pills. Patient denied psychosis. Patient stated "I can't deal with the outside world". When asked about a plan for HI, patient stated "I do not like people" and denied having a plan. Patient reported onset of SI with plan has been approx 2 months ago, which is when he was released from prison after serving 9 years in prison for assault with deadly weapon. Patient reported his main trigger is that he was told about resources that would help, patient tried and received no help. Patient reported worsening depressive symptoms. Patient reported drinking 18pk of beer daily. Patient reported 2 hours sleep and poor appetite due to stomach problems. Patient is currently homeless with no supportive family.   Patient reported history of psych hospitalizations at [facility] and [address]. Patient reported a few suicide attempts 11 years ago. Patient reported cutting himself 7 years ago, nothing recent. Patient reported not receiving any outpatient mental health services. Patient denied being on any psych medications. Patient was on psych medications while in prison. Patient reported stopping taking pills since released from prison and does not see a difference in behaviors. Patient was calm and cooperative during assessment.   Disposition [name], NP, recommends observation for safety and stabilization with psych reassessment in the AM. No appropriate beds at [facility], patient will remain at [facility]. Jeanice Lim, Charity fundraiser, informed of disposition.  Chief Complaint:  Chief Complaint  Patient presents with  . Abdominal Pain  . Chest Pain  . Back Pain  . Suicidal   Visit Diagnosis: Major depressive disorder and Alcohol dependence  CCA Biopsychosocial Intake/Chief Complaint:  SI with plan to overdose on pills.  Current  Symptoms/Problems: Worsening depressive symptoms  Patient Reported Schizophrenia/Schizoaffective Diagnosis in Past: No  Strengths: self-awarenee  Preferences: resources  Abilities: uta  Type of Services Patient Feels are Needed: inpatient, residential, housing and coping skills  Initial Clinical Notes/Concerns: No data recorded  Mental Health Symptoms Depression:  Irritability; Hopelessness; Worthlessness; Increase/decrease in appetite; Difficulty Concentrating; Fatigue; Tearfulness   Duration of Depressive symptoms: Greater than two weeks   Mania:  None   Anxiety:   Irritability; Fatigue; Restlessness; Worrying   Psychosis:  Affective flattening/alogia/avolition; Delusions; Hallucinations   Duration of Psychotic symptoms: No data recorded  Trauma:  No data recorded  Obsessions:  None   Compulsions:  No data recorded  Inattention:  N/A   Hyperactivity/Impulsivity:  N/A   Oppositional/Defiant Behaviors:  N/A   Emotional Irregularity:  No data recorded  Other Mood/Personality Symptoms:  No data recorded   Mental Status Exam Appearance and self-care  Stature:  Average   Weight:  Average weight   Clothing:  Age-appropriate   Grooming:  Normal   Cosmetic use:  None   Posture/gait:  Normal   Motor activity:  Restless   Sensorium  Attention:  Normal   Concentration:  Normal   Orientation:  Time; Situation; Place; Person   Recall/memory:  Normal   Affect and Mood  Affect:  Appropriate; Depressed   Mood:  Anxious; Depressed; Hopeless   Relating  Eye contact:  Normal   Facial expression:  Sad; Depressed   Attitude toward examiner:  Cooperative   Thought and Language  Speech flow: Normal   Thought content:  Appropriate to Mood and Circumstances   Preoccupation:  Suicide   Hallucinations:  None   Organization:  No data recorded  [employer] of Knowledge:  Average   Intelligence:  Average   Abstraction:  Normal   Judgement:   Fair   Dance movement psychotherapist:  Adequate   Insight:  Fair   Decision Making:  Impulsive   Social Functioning  Social Maturity:  No data recorded  Social Judgement:  Normal   Stress  Stressors:  Housing; Office manager Ability:  Exhausted; Overwhelmed; Deficient supports   Skill Deficits:  Self-care; Self-control; Responsibility   Supports:  Support needed    Religion:   Leisure/Recreation: Leisure / Recreation Do You Have Hobbies?: No  Exercise/Diet: Exercise/Diet Do You Exercise?: No Do You Follow a Special Diet?: No Do You Have Any Trouble Sleeping?: Yes Explanation of Sleeping Difficulties: 2  CCA Employment/Education Employment/Work Situation: Employment / Work Situation Employment situation: Unemployed Patient's job has been impacted by current illness: No What is the longest time patient has a 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 the patient employed at that time?: Various small companies Has patient ever been in the [employer]?: No  Education: Education Is Patient Currently Attending School?: No Last Grade Completed: 8 Name of High School: [employer] Did Garment/textile technologist From [employer]?: No Did You Product manager?: No Did Designer, television/film set?: No Did You Have An Individualized Education Program (IIEP): No Did You Have Any Difficulty At [employer]?: No Patient's Education Has Been Impacted by Current Illness: No  CCA Family/Childhood History Family and Relationship History: Family history Marital status: Married What types of issues is patient dealing with in the relationship?: uta Does patient have children?: Yes How many children?: 3 How is patient's relationship with their children?: okay  Childhood History:  Childhood History By whom was/is the patient raised?: Other (Comment),Both parents (Maternal Grandmother) Additional childhood history information: Lived with both parentas until age 1 then went to live with  maternal grandmother where I stayed until age 30 Description of patient's relationship with caregiver when they were a child: distant Did patient suffer any verbal/emotional/physical/sexual abuse as a child?: No Has patient ever been sexually abused/assaulted/raped as an adolescent or adult?: No Witnessed domestic violence?: No Has patient been affected by domestic violence as an adult?: No  Child/Adolescent Assessment:   CCA Substance Use Alcohol/Drug Use:  ASAM's:  Six Dimensions of Multidimensional Assessment  Dimension 1:  Acute Intoxication and/or Withdrawal Potential:      Dimension 2:  Biomedical Conditions and Complications:      Dimension 3:  Emotional, Behavioral, or Cognitive Conditions and Complications:     Dimension 4:  Readiness to Change:     Dimension 5:  Relapse, Continued use, or Continued Problem Potential:     Dimension 6:  Recovery/Living Environment:     ASAM Severity Score:    ASAM Recommended Level of Treatment:     Substance use Disorder (SUD)   Recommendations for Services/Supports/Treatments:  DSM5 Diagnoses: Patient Active Problem List   Diagnosis Date Noted  . Alcohol dependence (HCC) 03/20/2012  . Personality disorder (HCC) 03/20/2012  . Psychoactive substance-induced organic mood disorder (HCC) 03/20/2012    Class: Acute  . Alcohol abuse with intoxication (HCC) 03/20/2012    Class: Acute  . Acute blood loss anemia 02/10/2012  . Depression 02/10/2012  . Alcohol dependence (HCC) 02/09/2012    Class: Chronic  . 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    Class: Chronic  . Intentional self-harm by knife (HCC)  02/09/2012    Class: Acute  . ANEMIA 05/12/2010  . THROMBOCYTOPENIA 05/12/2010  . ALCOHOL ABUSE 05/12/2010  . EROSIVE ESOPHAGITIS 05/12/2010  . 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 HICCUPS, CHRONIC 05/12/2010  . CEREBROVASCULAR ACCIDENT, HX OF 05/12/2010  . HEPATITIS C 11/01/2009  . DEPRESSION 11/01/2009  . HYPERTENSION  11/01/2009  . CIRRHOSIS 11/01/2009   Patient Centered Plan: Patient is on the following Treatment Plan(s):    Referrals to Alternative Service(s): Referred to Alternative Service(s):   Place:   Date:   Time:    Referred to Alternative Service(s):   Place:   Date:   Time:    Referred to Alternative Service(s):   Place:   Date:   Time:    Referred to Alternative Service(s):   Place:   Date:   Time:     Burnetta Sabin, [facility]

## 2020-11-25 NOTE — ED Notes (Signed)
Pt has been asking to see a medical Dr. To get an up date on his CT scan from last night. RN aware. Pt was also very uncomfortable laying in the triage chair, writer found a recliner and a pillow for pt. Pt is now more comfortable and relaxed. Writer will continue to watch pt.

## 2020-11-25 NOTE — Progress Notes (Signed)
TOC CM spoke to pt at bedside. States he is currently homeless and recently released from prison. States he wants to go back to jail. Explained current shelters are at capacity for this evening. [facility] will have beds possibly tomorrow. He will need to go to [facility] in am to complete intake. Provided pt with 2 bus passes for transportation to [facility]. Pt will contact brother to see if he can assist with getting hotel.   [facility] -closed tonight.  [facility]- no answer  Holiday representative [facility]-no beds [facility]- not accepting applications [facility]-mailbox full Radiation protection practitioner [facility] -left message [facility]-left message   Isidoro Donning RN CCM, [place] ED TOC CM [phone]

## 2020-11-25 NOTE — BH Assessment (Addendum)
[facility] Assessment Progress Note  Per Berneice Heinrich, NP, this voluntary pt does not require psychiatric hospitalization at this time.  Pt is psychiatrically cleared.  Discharge instructions advise pt to follow up with [facility].  Peer Support and TOC consults have been ordered to address pt's substance abuse treatment and psychosocial needs respectively.  EDP Bethann Berkshire, MD and pt's nurse, Sharon Seller, have been notified.  Doylene Canning, MA Triage Specialist [phone]

## 2020-11-25 NOTE — Consult Note (Signed)
Telepsych Consultation   Reason for Consult: Psychiatry provider reassessment Referring Physician: [facility] physician Location of Patient: [facility] Location of Provider: Behavioral Health TTS Department  Patient Identification: [name] MRN:  [id number] Principal Diagnosis: Alcohol dependence (HCC) Diagnosis:  Principal Problem:   Alcohol dependence (HCC)   Total Time spent with patient: 30 minutes  Subjective:   [name] is a 65 y.o. male patient.  Patient states "I need help getting my life together, my life has been arrested for 60 years, I need to get on disability and I was supposed to get a meeting set up 90 days before discharge from prison but I missed my appointment because of Covid.  HPI:   Patient reports passive suicidal ideations on yesterday and related to socioeconomic concerns.  Patient denies suicidal ideations currently.  Patient denies history of suicide attempts, denies history of self-harm behaviors.  Patient contracts verbally for safety with this Clinical research associate.  Patient reports he was released from prison approximately 3 weeks ago after 11 years of prison.  Patient reports for the 11 years he was in prison he did not drink but began drinking again 3 weeks ago, patient reports that alcohol has negatively affected his mood.  Patient reports he would be interested in inpatient alcohol abuse treatment if available.  Patient reports recent stressors include inability to be excepted into homeless shelter related to Covid and decreased availability.  Patient reports she is currently homeless in [place].  Patient denies access to weapons.  Patient reports he is currently unemployed but seeking disability benefits related to chronic health conditions.  Patient endorses alcohol use, reports approximately 4 drinks per day x3 weeks.  Patient reports first use of alcohol at age 67 years old.  Patient denies substance use aside  from alcohol.  Patient endorses average sleep and appetite.  Patient reports he was diagnosed with depression while in prison.  Patient reports he threw away his medications upon leaving prison and states "I cannot tell any difference, but those medications are not working."  Patient reports he is not currently seen by outpatient psychiatry, he has not yet established outpatient psychiatry follow-up since released from prison.  Patient assessed by nurse practitioner.  Patient alert and oriented, answers appropriately.  Patient denies suicidal and homicidal ideations.  Patient denies both auditory and visual hallucinations.  There is no evidence of delusional thought content and no indication that patient is responding to internal stimuli.  Patient denies symptoms of paranoia.  Patient offered support and encouragement.  Patient declines anyone to contact for collateral information at this time  Past Psychiatric History: Alcohol use disorder, depression  Risk to Self:  Denies Risk to Others:  Denies Prior Inpatient Therapy:  [facility] 2013, patient reports history of admission to "[facility] many years ago" Prior Outpatient Therapy:  Patient reports he has seen outpatient psychiatry in the distant past  Past Medical History:  Past Medical History:  Diagnosis Date  . Anxiety   . Cirrhosis (HCC)   . Depression   . Hep C w/o coma, chronic (HCC)   . Hepatitis C   . Hypertension   . Pancreatitis   . Suicide attempt [facility])     Past Surgical History:  Procedure Laterality Date  . ABDOMINAL SURGERY    . EXPLORATORY LAPAROTOMY  02/08/11   for self inflicted SW; oversew bleeding omentum  . HERNIA REPAIR    . LAPAROTOMY  02/08/2012   Procedure: EXPLORATORY LAPAROTOMY;  Surgeon: Almond Lint, MD;  Location: [facility] OR;  Service: General;  Laterality: N/A;  exploratory laparotomy, wound exploration and repair of traumatic hernia  . LAPAROTOMY     Family History: No family history on file. Family  Psychiatric  History: Maternal grandfather-alcohol use disorder Social History:  Social History   Substance and Sexual Activity  Alcohol Use Yes  . Alcohol/week: 20.0 standard drinks  . Types: 20 Cans of beer per week   Comment: 40 oz     Social History   Substance and Sexual Activity  Drug Use No    Social History   Socioeconomic History  . Marital status: Single    Spouse name: Not on file  . Number of children: Not on file  . Years of education: Not on file  . Highest education level: Not on file  Occupational History  . Not on file  Tobacco Use  . Smoking status: Never Smoker  . Smokeless tobacco: Former Clinical biochemist  . Vaping Use: Never used  Substance and Sexual Activity  . Alcohol use: Yes    Alcohol/week: 20.0 standard drinks    Types: 20 Cans of beer per week    Comment: 40 oz  . Drug use: No  . Sexual activity: Not on file    Comment: refused to answer  Other Topics Concern  . Not on file  Social History Narrative   ** Merged History Encounter **       ** Merged History Encounter **       Social Determinants of Health   Financial Resource Strain: Not on file  Food Insecurity: Not on file  Transportation Needs: Not on file  Physical Activity: Not on file  Stress: Not on file  Social Connections: Not on file   Additional Social History:    Allergies:  No Known Allergies  Labs:  Results for orders placed or performed during the hospital encounter of 11/24/20 (from the past 48 hour(s))  Basic metabolic panel     Status: Abnormal   Collection Time: 11/24/20  4:58 PM  Result Value Ref Range   Sodium 134 (L) 135 - 145 mmol/L   Potassium 3.5 3.5 - 5.1 mmol/L   Chloride 102 98 - 111 mmol/L   CO2 19 (L) 22 - 32 mmol/L   Glucose, Bld 98 70 - 99 mg/dL    Comment: Glucose reference range applies only to samples taken after fasting for at least 8 hours.   BUN 10 8 - 23 mg/dL   Creatinine, Ser 5.17 0.61 - 1.24 mg/dL   Calcium 9.1 8.9 - 61.6  mg/dL   GFR, Estimated >07 >37 mL/min    Comment: (NOTE) Calculated using the CKD-EPI Creatinine Equation (2021)    Anion gap 13 5 - 15    Comment: Performed at [facility], [address], [place], [place] [address]  CBC     Status: Abnormal   Collection Time: 11/24/20  4:58 PM  Result Value Ref Range   WBC 8.5 4.0 - 10.5 K/uL   RBC 5.35 4.22 - 5.81 MIL/uL   Hemoglobin 14.0 13.0 - 17.0 g/dL   HCT 94.8 54.6 - 27.0 %   MCV 81.3 80.0 - 100.0 fL   MCH 26.2 26.0 - 34.0 pg   MCHC 32.2 30.0 - 36.0 g/dL   RDW 35.0 (H) 09.3 - 81.8 %   Platelets 147 (L) 150 - 400 K/uL   nRBC 0.0 0.0 - 0.2 %  Comment: Performed at [facility], [address], [place], [place] [address]  Troponin I (High Sensitivity)     Status: None   Collection Time: 11/24/20  4:58 PM  Result Value Ref Range   Troponin I (High Sensitivity) 8 <18 ng/L    Comment: (NOTE) Elevated high sensitivity troponin I (hsTnI) values and significant  changes across serial measurements may suggest ACS but many other  chronic and acute conditions are known to elevate hsTnI results.  Refer to the "Links" section for chest pain algorithms and additional  guidance. Performed at [facility], [address], [place], [place] [address]   Ethanol     Status: Abnormal   Collection Time: 11/24/20  4:58 PM  Result Value Ref Range   Alcohol, Ethyl (B) 284 (H) <10 mg/dL    Comment: (NOTE) Lowest detectable limit for serum alcohol is 10 mg/dL.  For medical purposes only. Performed at [facility], [address], [place], [place] [address]   Salicylate level     Status: Abnormal   Collection Time: 11/24/20  4:58 PM  Result Value Ref Range   Salicylate Lvl <7.0 (L) 7.0 - 30.0 mg/dL    Comment: Performed at [facility], [address], [place], [place] [address]  Acetaminophen level     Status: Abnormal   Collection Time: 11/24/20  4:58 PM   Result Value Ref Range   Acetaminophen (Tylenol), Serum <10 (L) 10 - 30 ug/mL    Comment: (NOTE) Therapeutic concentrations vary significantly. A range of 10-30 ug/mL  may be an effective concentration for many patients. However, some  are best treated at concentrations outside of this range. Acetaminophen concentrations >150 ug/mL at 4 hours after ingestion  and >50 ug/mL at 12 hours after ingestion are often associated with  toxic reactions.  Performed at [facility], [address], [place], [place] [address]   Resp Panel by RT-PCR (Flu A&B, Covid) Nasopharyngeal Swab     Status: None   Collection Time: 11/24/20  6:11 PM   Specimen: Nasopharyngeal Swab; Nasopharyngeal(NP) swabs in vial transport medium  Result Value Ref Range   SARS Coronavirus 2 by RT PCR NEGATIVE NEGATIVE    Comment: (NOTE) SARS-CoV-2 target nucleic acids are NOT DETECTED.  The SARS-CoV-2 RNA is generally detectable in upper respiratory specimens during the acute phase of infection. The lowest concentration of SARS-CoV-2 viral copies this assay can detect is 138 copies/mL. A negative result does not preclude SARS-Cov-2 infection and should not be used as the sole basis for treatment or other patient management decisions. A negative result may occur with  improper specimen collection/handling, submission of specimen other than nasopharyngeal swab, presence of viral mutation(s) within the areas targeted by this assay, and inadequate number of viral copies(<138 copies/mL). A negative result must be combined with clinical observations, patient history, and epidemiological information. The expected result is Negative.  Fact Sheet for Patients:  BloggerCourse.com  Fact Sheet for Healthcare Providers:  SeriousBroker.it  This test is no t yet approved or cleared by the [place] FDA and  has been authorized for detection and/or diagnosis  of SARS-CoV-2 by FDA under an Emergency Use Authorization (EUA). This EUA will remain  in effect (meaning this test can be used) for the duration of the COVID-19 declaration under Section 564(b)(1) of the Act, 21 U.S.C.section 360bbb-3(b)(1), unless the authorization is terminated  or revoked sooner.       Influenza A by  PCR NEGATIVE NEGATIVE   Influenza B by PCR NEGATIVE NEGATIVE    Comment: (NOTE) The Xpert Xpress SARS-CoV-2/FLU/RSV plus assay is intended as an aid in the diagnosis of influenza from Nasopharyngeal swab specimens and should not be used as a sole basis for treatment. Nasal washings and aspirates are unacceptable for Xpert Xpress SARS-CoV-2/FLU/RSV testing.  Fact Sheet for Patients: BloggerCourse.com  Fact Sheet for Healthcare Providers: SeriousBroker.it  This test is not yet approved or cleared by the [place] FDA and has been authorized for detection and/or diagnosis of SARS-CoV-2 by FDA under an Emergency Use Authorization (EUA). This EUA will remain in effect (meaning this test can be used) for the duration of the COVID-19 declaration under Section 564(b)(1) of the Act, 21 U.S.C. section 360bbb-3(b)(1), unless the authorization is terminated or revoked.  Performed at [facility], [address] [place], [place] [address]   Rapid urine drug screen (hospital performed)     Status: None   Collection Time: 11/24/20  6:17 PM  Result Value Ref Range   Opiates NONE DETECTED NONE DETECTED   Cocaine NONE DETECTED NONE DETECTED   Benzodiazepines NONE DETECTED NONE DETECTED   Amphetamines NONE DETECTED NONE DETECTED   Tetrahydrocannabinol NONE DETECTED NONE DETECTED   Barbiturates NONE DETECTED NONE DETECTED    Comment: (NOTE) DRUG SCREEN FOR MEDICAL PURPOSES ONLY.  IF CONFIRMATION IS NEEDED FOR ANY PURPOSE, NOTIFY LAB WITHIN 5 DAYS.  LOWEST DETECTABLE LIMITS FOR URINE DRUG  SCREEN Drug Class                     Cutoff (ng/mL) Amphetamine and metabolites    1000 Barbiturate and metabolites    200 Benzodiazepine                 200 Tricyclics and metabolites     300 Opiates and metabolites        300 Cocaine and metabolites        300 THC                            50 Performed at [facility], [address] [place], [place] [address]   Urinalysis, Routine w reflex microscopic Urine, Clean Catch     Status: Abnormal   Collection Time: 11/24/20  6:17 PM  Result Value Ref Range   Color, Urine STRAW (A) YELLOW   APPearance CLEAR CLEAR   Specific Gravity, Urine 1.002 (L) 1.005 - 1.030   pH 5.0 5.0 - 8.0   Glucose, UA NEGATIVE NEGATIVE mg/dL   Hgb urine dipstick NEGATIVE NEGATIVE   Bilirubin Urine NEGATIVE NEGATIVE   Ketones, ur NEGATIVE NEGATIVE mg/dL   Protein, ur NEGATIVE NEGATIVE mg/dL   Nitrite NEGATIVE NEGATIVE   Leukocytes,Ua NEGATIVE NEGATIVE    Comment: Performed at [name], [address], [place], [place] [address]  Hepatic function panel     Status: Abnormal   Collection Time: 11/24/20  8:10 PM  Result Value Ref Range   Total Protein 8.3 (H) 6.5 - 8.1 g/dL   Albumin 4.0 3.5 - 5.0 g/dL   AST 28 15 - 41 U/L   ALT 21 0 - 44 U/L   Alkaline Phosphatase 66 38 - 126 U/L   Total Bilirubin 1.4 (H) 0.3 - 1.2 mg/dL   Bilirubin, Direct 0.3 (H) 0.0 - 0.2 mg/dL   Indirect Bilirubin 1.1 (H) 0.3 - 0.9 mg/dL  Comment: Performed at [facility], [address] [place], [place] [address]  Lipase, blood     Status: None   Collection Time: 11/24/20  8:10 PM  Result Value Ref Range   Lipase 36 11 - 51 U/L    Comment: Performed at [facility], [address] [place], [place] [address]  Troponin I (High Sensitivity)     Status: None   Collection Time: 11/24/20  8:10 PM  Result Value Ref Range   Troponin I (High Sensitivity) 5 <18 ng/L    Comment: (NOTE) Elevated high  sensitivity troponin I (hsTnI) values and significant  changes across serial measurements may suggest ACS but many other  chronic and acute conditions are known to elevate hsTnI results.  Refer to the "Links" section for chest pain algorithms and additional  guidance. Performed at [facility], [address] [place], [place] [address]     Medications:  Current Facility-Administered Medications  Medication Dose Route Frequency Provider Last Rate Last Admin  . alum & mag hydroxide-simeth (MAALOX/MYLANTA) 200-200-20 MG/5ML suspension 30 mL  30 mL Oral Q6H PRN Jeannie Fend, PA-C      . bacitracin ointment   Topical BID Jeannie Fend, PA-C      . LORazepam (ATIVAN) injection 0-4 mg  0-4 mg Intravenous Q6H Jeannie Fend, PA-C       Or  . LORazepam (ATIVAN) tablet 0-4 mg  0-4 mg Oral Q6H Jeannie Fend, PA-C      . [START ON 11/27/2020] LORazepam (ATIVAN) injection 0-4 mg  0-4 mg Intravenous Q12H Jeannie Fend, PA-C       Or  . Melene Muller ON 11/27/2020] LORazepam (ATIVAN) tablet 0-4 mg  0-4 mg Oral Q12H Army Melia A, PA-C      . melatonin tablet 5 mg  5 mg Oral QHS Jeannie Fend, PA-C      . ondansetron [facility]) tablet 4 mg  4 mg Oral Q8H PRN Jeannie Fend, PA-C      . thiamine tablet 100 mg  100 mg Oral Daily Army Melia A, PA-C       Or  . thiamine (B-1) injection 100 mg  100 mg Intravenous Daily Army Melia A, PA-C       No current outpatient medications on file.    Musculoskeletal: Strength & Muscle Tone: within normal limits Gait & Station: normal Patient leans: N/A  Psychiatric Specialty Exam: Physical Exam Vitals and nursing note reviewed.  Constitutional:      Appearance: He is well-developed.  HENT:     Head: Normocephalic.  Cardiovascular:     Rate and Rhythm: Normal rate.  Pulmonary:     Effort: Pulmonary effort is normal.  Neurological:     Mental Status: He is alert and oriented to person, place, and time.     Review of  Systems  Blood pressure 136/71, pulse 72, temperature 98 F (36.7 C), temperature source Oral, resp. rate 15, height 5\' 8"  (1.727 m), weight 104.3 kg, SpO2 94 %.Body mass index is 34.97 kg/m.  General Appearance: Casual and Fairly Groomed  Eye Contact:  Good  Speech:  Clear and Coherent and Normal Rate  Volume:  Normal  Mood:  Depressed  Affect:  Appropriate and Congruent  Thought Process:  Coherent, Goal Directed and Descriptions of Associations: Intact  Orientation:  Full (Time, Place, and Person)  Thought Content:  Logical  Suicidal Thoughts:  No  Homicidal Thoughts:  No  Memory:  Immediate;   Good Recent;   Good Remote;   Good  Judgement:  Fair  Insight:  Good  Psychomotor Activity:  Normal  Concentration:  Concentration: Good and Attention Span: Good  Recall:  Good  Fund of Knowledge:  Good  Language:  Good  Akathisia:  No  Handed:  Right  AIMS (if indicated):     Assets:  Communication Skills Desire for Improvement Financial Resources/Insurance Intimacy Leisure Time Resilience Social Support  ADL's:  Intact  Cognition:  WNL  Sleep:        Treatment Plan Summary: Plan Patient reviewed with Dr. Bronwen BettersLaubach.  Patient cleared by psychiatry. Recommend follow-up with outpatient psychiatry and substance use treatment resources provided. Peers support consult initiated. Social work consult initiated to provide housing resources.  Disposition: No evidence of imminent risk to self or others at present.   Patient does not meet criteria for psychiatric inpatient admission. Supportive therapy provided about ongoing stressors. Discussed crisis plan, support from social network, calling 911, coming to the Emergency Department, and calling Suicide Hotline.  This service was provided via telemedicine using a 2-way, interactive audio and video technology.  Names of all persons participating in this telemedicine service and their role in this encounter. Name: [name] Role:  Patient  Name: Berneice Heinrichina Tate Role: FNP  Name: Dr. Bronwen BettersLaubach Role: Psychiatrist    Patrcia Dollyina L Tate, FNP 11/25/2020 11:32 AM

## 2020-11-25 NOTE — ED Notes (Signed)
Pt asked for meds for stomach pains RN aware.

## 2020-11-25 NOTE — ED Provider Notes (Signed)
Patient is cleared by psychiatry.  He will be seen by Child psychotherapist and peers support and will be discharged home   Bethann Berkshire, MD 11/25/20 1151

## 2020-11-25 NOTE — BH Assessment (Signed)
[name], NP, recommends observation for safety and stabilization with psych reassessment in the AM.

## 2020-11-25 NOTE — Discharge Instructions (Signed)
For your behavioral health needs, you are advised to follow up with [facility]:      [facility]      [address]      [place], [place] [address]      [phone]      They offer psychiatry/medication management, therapy and substance abuse treatment.  New patients are being seen in their walk-in clinic.  Walk-in hours are Monday - Thursday from 8:00 am - 11:00 am for psychiatry, and Friday from 1:00 pm - 4:00 pm for therapy.  Walk-in patients are seen on a first come, first served basis, so try to arrive as early as possible for the best chance of being seen the same day.  

## 2020-11-25 NOTE — Patient Outreach (Signed)
[name] was able to complete referral package for [employer]. [name] faxed Pt infromation off with Pt consent, [name] was made aware that it will be Next week before Pt would be able to be admitted to facility. [name] discussed RTS as far as placement at this time. [name] contacted Pt Parole officer an explained what is going on at this time for Pt. [name] left Pt with contact information for facilities an [name] to follow up on the statis of his application.

## 2020-11-25 NOTE — Patient Outreach (Signed)
ED Peer Support Specialist Patient Intake (Complete at intake & 30-60 Day Follow-up)  Name: [name]  MRN: [id number]  Age: 65 y.o.   Date of Admission: 11/25/2020  Intake: Initial Comments:      Primary Reason Admitted: Alcohol Problem  Lab values: Alcohol/ETOH: Positive Positive UDS? No Amphetamines: No Barbiturates: No Benzodiazepines: No Cocaine: No Opiates: No Cannabinoids: No  Demographic information: Gender: Male Ethnicity: White Marital Status: Widowed Insurance Status: Patent attorney (Work Engineer, agricultural, Sales executive, etc.: No Lives with: Alone Living situation: Homeless  Reported Patient History: Patient reported health conditions: Bipolar disorder Patient aware of HIV and hepatitis status: No  In past year, has patient visited ED for any reason? No  Number of ED visits:    Reason(s) for visit:    In past year, has patient been hospitalized for any reason? No  Number of hospitalizations:    Reason(s) for hospitalization:    In past year, has patient been arrested? No  Number of arrests:    Reason(s) for arrest:    In past year, has patient been incarcerated? No  Number of incarcerations:    Reason(s) for incarceration:    In past year, has patient received medication-assisted treatment? No  In past year, patient received the following treatments: Residential treatment (non-hospital)  In past year, has patient received any harm reduction services? No  Did this include any of the following?    In past year, has patient received care from a mental health provider for diagnosis other than SUD? No  In past year, is this first time patient has overdosed? No  Number of past overdoses:    In past year, is this first time patient has been hospitalized for an overdose? No  Number of hospitalizations for overdose(s):    Is patient currently receiving treatment for a mental health diagnosis? No  Patient reports  experiencing difficulty participating in SUD treatment: No    Most important reason(s) for this difficulty?    Has patient received prior services for treatment? No  In past, patient has received services from following agencies:    Plan of Care:  Suggested follow up at these agencies/treatment centers: Other (comment)  Other information: [name] stated that he just came home from Prison an has not received any assistance. [name] are contacting several facilities seeking placement.    [name], [name]  11/25/2020 12:18 PM

## 2020-12-01 ENCOUNTER — Encounter (HOSPITAL_COMMUNITY): Admission: EM | Disposition: A | Discharge status: Home / Self Care | Payer: Self-pay | Source: Home / Self Care

## 2020-12-01 ENCOUNTER — Inpatient Hospital Stay (HOSPITAL_COMMUNITY): Payer: Medicaid Other | Admitting: Anesthesiology

## 2020-12-01 ENCOUNTER — Encounter (HOSPITAL_COMMUNITY): Payer: Self-pay

## 2020-12-01 ENCOUNTER — Inpatient Hospital Stay (HOSPITAL_COMMUNITY)
Admission: EM | Admit: 2020-12-01 | Discharge: 2021-01-18 | DRG: 329 | Disposition: A | Discharge status: Home / Self Care | Payer: Medicaid Other | Attending: Surgery | Admitting: Surgery

## 2020-12-01 ENCOUNTER — Emergency Department (HOSPITAL_COMMUNITY): Payer: Medicaid Other

## 2020-12-01 ENCOUNTER — Inpatient Hospital Stay (HOSPITAL_COMMUNITY): Payer: Medicaid Other

## 2020-12-01 DIAGNOSIS — S36439A Laceration of unspecified part of small intestine, initial encounter: Secondary | ICD-10-CM

## 2020-12-01 DIAGNOSIS — Z9889 Other specified postprocedural states: Secondary | ICD-10-CM | POA: Diagnosis present

## 2020-12-01 DIAGNOSIS — F1010 Alcohol abuse, uncomplicated: Secondary | ICD-10-CM | POA: Diagnosis present

## 2020-12-01 DIAGNOSIS — E669 Obesity, unspecified: Secondary | ICD-10-CM

## 2020-12-01 DIAGNOSIS — X838XXA Intentional self-harm by other specified means, initial encounter: Secondary | ICD-10-CM

## 2020-12-01 DIAGNOSIS — K7460 Unspecified cirrhosis of liver: Secondary | ICD-10-CM | POA: Diagnosis present

## 2020-12-01 DIAGNOSIS — Z4659 Encounter for fitting and adjustment of other gastrointestinal appliance and device: Secondary | ICD-10-CM

## 2020-12-01 DIAGNOSIS — K432 Incisional hernia without obstruction or gangrene: Secondary | ICD-10-CM

## 2020-12-01 DIAGNOSIS — S31119A Laceration without foreign body of abdominal wall, unspecified quadrant without penetration into peritoneal cavity, initial encounter: Principal | ICD-10-CM

## 2020-12-01 DIAGNOSIS — K5909 Other constipation: Secondary | ICD-10-CM

## 2020-12-01 DIAGNOSIS — T1491XA Suicide attempt, initial encounter: Secondary | ICD-10-CM

## 2020-12-01 DIAGNOSIS — T1490XA Injury, unspecified, initial encounter: Secondary | ICD-10-CM

## 2020-12-01 DIAGNOSIS — K660 Peritoneal adhesions (postprocedural) (postinfection): Secondary | ICD-10-CM | POA: Diagnosis present

## 2020-12-01 DIAGNOSIS — Z765 Malingerer [conscious simulation]: Secondary | ICD-10-CM

## 2020-12-01 DIAGNOSIS — L03311 Cellulitis of abdominal wall: Secondary | ICD-10-CM | POA: Diagnosis not present

## 2020-12-01 DIAGNOSIS — X788XXA Intentional self-harm by other sharp object, initial encounter: Secondary | ICD-10-CM | POA: Diagnosis present

## 2020-12-01 DIAGNOSIS — Z23 Encounter for immunization: Secondary | ICD-10-CM | POA: Diagnosis not present

## 2020-12-01 DIAGNOSIS — I10 Essential (primary) hypertension: Secondary | ICD-10-CM | POA: Diagnosis present

## 2020-12-01 DIAGNOSIS — D62 Acute posthemorrhagic anemia: Secondary | ICD-10-CM | POA: Diagnosis not present

## 2020-12-01 DIAGNOSIS — S36439S Laceration of unspecified part of small intestine, sequela: Secondary | ICD-10-CM | POA: Diagnosis not present

## 2020-12-01 DIAGNOSIS — F319 Bipolar disorder, unspecified: Secondary | ICD-10-CM | POA: Diagnosis present

## 2020-12-01 DIAGNOSIS — I69334 Monoplegia of upper limb following cerebral infarction affecting left non-dominant side: Secondary | ICD-10-CM | POA: Diagnosis not present

## 2020-12-01 DIAGNOSIS — G4700 Insomnia, unspecified: Secondary | ICD-10-CM | POA: Diagnosis not present

## 2020-12-01 DIAGNOSIS — K567 Ileus, unspecified: Secondary | ICD-10-CM | POA: Diagnosis not present

## 2020-12-01 DIAGNOSIS — F5104 Psychophysiologic insomnia: Secondary | ICD-10-CM | POA: Diagnosis present

## 2020-12-01 DIAGNOSIS — S31615A Laceration without foreign body of abdominal wall, periumbilic region with penetration into peritoneal cavity, initial encounter: Secondary | ICD-10-CM | POA: Diagnosis present

## 2020-12-01 DIAGNOSIS — Z9151 Personal history of suicidal behavior: Secondary | ICD-10-CM | POA: Diagnosis not present

## 2020-12-01 DIAGNOSIS — Z8619 Personal history of other infectious and parasitic diseases: Secondary | ICD-10-CM

## 2020-12-01 DIAGNOSIS — F251 Schizoaffective disorder, depressive type: Secondary | ICD-10-CM | POA: Diagnosis present

## 2020-12-01 DIAGNOSIS — Z5900 Homelessness unspecified: Secondary | ICD-10-CM

## 2020-12-01 DIAGNOSIS — T8133XA Disruption of traumatic injury wound repair, initial encounter: Secondary | ICD-10-CM | POA: Diagnosis not present

## 2020-12-01 DIAGNOSIS — F1024 Alcohol dependence with alcohol-induced mood disorder: Secondary | ICD-10-CM | POA: Diagnosis not present

## 2020-12-01 DIAGNOSIS — R45851 Suicidal ideations: Secondary | ICD-10-CM | POA: Diagnosis not present

## 2020-12-01 DIAGNOSIS — G8929 Other chronic pain: Secondary | ICD-10-CM | POA: Diagnosis present

## 2020-12-01 DIAGNOSIS — F29 Unspecified psychosis not due to a substance or known physiological condition: Secondary | ICD-10-CM | POA: Diagnosis not present

## 2020-12-01 DIAGNOSIS — F322 Major depressive disorder, single episode, severe without psychotic features: Secondary | ICD-10-CM | POA: Diagnosis not present

## 2020-12-01 DIAGNOSIS — Z20822 Contact with and (suspected) exposure to covid-19: Secondary | ICD-10-CM | POA: Diagnosis present

## 2020-12-01 HISTORY — DX: Unspecified cirrhosis of liver: K74.60

## 2020-12-01 HISTORY — DX: Alcohol abuse, uncomplicated: F10.10

## 2020-12-01 HISTORY — PX: LAPAROTOMY: SHX154

## 2020-12-01 HISTORY — PX: LYSIS OF ADHESION: SHX5961

## 2020-12-01 LAB — I-STAT CHEM 8, ED
BUN: 6 mg/dL — ABNORMAL LOW (ref 8–23)
Calcium, Ion: 1.12 mmol/L — ABNORMAL LOW (ref 1.15–1.40)
Chloride: 105 mmol/L (ref 98–111)
Creatinine, Ser: 1.1 mg/dL (ref 0.61–1.24)
Glucose, Bld: 90 mg/dL (ref 70–99)
HCT: 42 % (ref 39.0–52.0)
Hemoglobin: 14.3 g/dL (ref 13.0–17.0)
Potassium: 3.8 mmol/L (ref 3.5–5.1)
Sodium: 142 mmol/L (ref 135–145)
TCO2: 24 mmol/L (ref 22–32)

## 2020-12-01 LAB — CBC
HCT: 43.8 % (ref 39.0–52.0)
Hemoglobin: 13.4 g/dL (ref 13.0–17.0)
MCH: 25.6 pg — ABNORMAL LOW (ref 26.0–34.0)
MCHC: 30.6 g/dL (ref 30.0–36.0)
MCV: 83.7 fL (ref 80.0–100.0)
Platelets: 107 10*3/uL — ABNORMAL LOW (ref 150–400)
RBC: 5.23 MIL/uL (ref 4.22–5.81)
RDW: 18.8 % — ABNORMAL HIGH (ref 11.5–15.5)
WBC: 6.3 10*3/uL (ref 4.0–10.5)
nRBC: 0 % (ref 0.0–0.2)

## 2020-12-01 LAB — COMPREHENSIVE METABOLIC PANEL
ALT: 31 U/L (ref 0–44)
AST: 42 U/L — ABNORMAL HIGH (ref 15–41)
Albumin: 3.4 g/dL — ABNORMAL LOW (ref 3.5–5.0)
Alkaline Phosphatase: 60 U/L (ref 38–126)
Anion gap: 11 (ref 5–15)
BUN: 6 mg/dL — ABNORMAL LOW (ref 8–23)
CO2: 22 mmol/L (ref 22–32)
Calcium: 9.1 mg/dL (ref 8.9–10.3)
Chloride: 106 mmol/L (ref 98–111)
Creatinine, Ser: 0.94 mg/dL (ref 0.61–1.24)
GFR, Estimated: >60 mL/min (ref >60)
Glucose, Bld: 89 mg/dL (ref 70–99)
Potassium: 3.7 mmol/L (ref 3.5–5.1)
Sodium: 139 mmol/L (ref 135–145)
Total Bilirubin: 1.3 mg/dL — ABNORMAL HIGH (ref 0.3–1.2)
Total Protein: 7.4 g/dL (ref 6.5–8.1)

## 2020-12-01 LAB — ETHANOL: Alcohol, Ethyl (B): 94 mg/dL — ABNORMAL HIGH (ref <10)

## 2020-12-01 LAB — PROTIME-INR
INR: 1.1 (ref 0.8–1.2)
Prothrombin Time: 13.5 seconds (ref 11.4–15.2)

## 2020-12-01 LAB — SAMPLE TO BLOOD BANK

## 2020-12-01 LAB — RESP PANEL BY RT-PCR (FLU A&B, COVID) ARPGX2
Influenza A by PCR: NEGATIVE
Influenza B by PCR: NEGATIVE
SARS Coronavirus 2 by RT PCR: NEGATIVE

## 2020-12-01 LAB — HIV ANTIBODY (ROUTINE TESTING W REFLEX): HIV Screen 4th Generation wRfx: NONREACTIVE

## 2020-12-01 LAB — LACTIC ACID, PLASMA: Lactic Acid, Venous: 2 mmol/L (ref 0.5–1.9)

## 2020-12-01 SURGERY — LAPAROTOMY, EXPLORATORY
Anesthesia: General | Site: Abdomen

## 2020-12-01 MED ORDER — FENTANYL CITRATE (PF) 250 MCG/5ML IJ SOLN
INTRAMUSCULAR | Status: AC
  Filled 2020-12-01: qty 5

## 2020-12-01 MED ORDER — ONDANSETRON HCL 4 MG/2ML IJ SOLN: 4.0000 mg | Freq: Once | INTRAMUSCULAR | Status: DC | PRN

## 2020-12-01 MED ORDER — ROCURONIUM 10MG/ML (10ML) SYRINGE FOR MEDFUSION PUMP - OPTIME
INTRAVENOUS | Status: DC | PRN
  Administered 2020-12-01: 50 mg via INTRAVENOUS

## 2020-12-01 MED ORDER — ENOXAPARIN SODIUM 30 MG/0.3ML ~~LOC~~ SOLN
30.0000 mg | Freq: Two times a day (BID) | SUBCUTANEOUS | Status: DC
  Administered 2020-12-02 – 2021-01-18 (×95): 30 mg via SUBCUTANEOUS
  Filled 2020-12-01 (×97): qty 0.3

## 2020-12-01 MED ORDER — TETANUS-DIPHTH-ACELL PERTUSSIS 5-2.5-18.5 LF-MCG/0.5 IM SUSY
0.5000 mL | PREFILLED_SYRINGE | Freq: Once | INTRAMUSCULAR | Status: AC
  Administered 2020-12-01: 0.5 mL via INTRAMUSCULAR
  Filled 2020-12-01: qty 0.5

## 2020-12-01 MED ORDER — EPHEDRINE SULFATE 50 MG/ML IJ SOLN
INTRAMUSCULAR | Status: DC | PRN
  Administered 2020-12-01: 10 mg via INTRAVENOUS

## 2020-12-01 MED ORDER — LORAZEPAM 1 MG PO TABS: 1.0000 mg | ORAL_TABLET | ORAL | Status: DC | PRN

## 2020-12-01 MED ORDER — ONDANSETRON 4 MG PO TBDP: 4.0000 mg | ORAL_TABLET | Freq: Four times a day (QID) | ORAL | Status: DC | PRN

## 2020-12-01 MED ORDER — ONDANSETRON HCL 4 MG/2ML IJ SOLN: 4.0000 mg | Freq: Four times a day (QID) | INTRAMUSCULAR | Status: DC | PRN

## 2020-12-01 MED ORDER — PROPOFOL 10 MG/ML IV BOLUS
INTRAVENOUS | Status: DC | PRN
  Administered 2020-12-01: 170 mg via INTRAVENOUS

## 2020-12-01 MED ORDER — OXYCODONE HCL 5 MG PO TABS: 5.0000 mg | ORAL_TABLET | Freq: Once | ORAL | Status: DC | PRN

## 2020-12-01 MED ORDER — HYDROMORPHONE HCL 1 MG/ML IJ SOLN
INTRAMUSCULAR | Status: AC
  Administered 2020-12-02: 1 mg
  Filled 2020-12-01: qty 1

## 2020-12-01 MED ORDER — HYDROMORPHONE HCL 1 MG/ML IJ SOLN
0.2500 mg | INTRAMUSCULAR | Status: DC | PRN
Start: 2020-12-01 — End: 2020-12-02
  Administered 2020-12-01 (×4): 0.5 mg via INTRAVENOUS
  Filled 2020-12-01: qty 1

## 2020-12-01 MED ORDER — LIDOCAINE HCL (CARDIAC) PF 100 MG/5ML IV SOSY
PREFILLED_SYRINGE | INTRAVENOUS | Status: DC | PRN
  Administered 2020-12-01: 100 mg via INTRAVENOUS

## 2020-12-01 MED ORDER — METRONIDAZOLE IN NACL 5-0.79 MG/ML-% IV SOLN
500.0000 mg | Freq: Once | INTRAVENOUS | Status: AC
  Administered 2020-12-01: 500 mg via INTRAVENOUS
  Filled 2020-12-01: qty 100

## 2020-12-01 MED ORDER — BACITRACIN-NEOMYCIN-POLYMYXIN OINTMENT TUBE
TOPICAL_OINTMENT | CUTANEOUS | Status: AC
  Filled 2020-12-01: qty 14.17

## 2020-12-01 MED ORDER — DEXTROSE 5 % IV SOLN
3.0000 g | Freq: Once | INTRAVENOUS | Status: AC
  Administered 2020-12-01: 3 g via INTRAVENOUS
  Filled 2020-12-01: qty 3000

## 2020-12-01 MED ORDER — FENTANYL CITRATE (PF) 250 MCG/5ML IJ SOLN
INTRAMUSCULAR | Status: DC | PRN
  Administered 2020-12-01: 150 ug via INTRAVENOUS
  Administered 2020-12-01: 100 ug via INTRAVENOUS

## 2020-12-01 MED ORDER — SUGAMMADEX SODIUM 500 MG/5ML IV SOLN
INTRAVENOUS | Status: AC
  Filled 2020-12-01: qty 5

## 2020-12-01 MED ORDER — SUCCINYLCHOLINE 20MG/ML (10ML) SYRINGE FOR MEDFUSION PUMP - OPTIME
INTRAMUSCULAR | Status: DC | PRN
  Administered 2020-12-01: 140 mg via INTRAVENOUS

## 2020-12-01 MED ORDER — ONDANSETRON HCL 4 MG/2ML IJ SOLN
INTRAMUSCULAR | Status: DC | PRN
  Administered 2020-12-01: 4 mg via INTRAVENOUS

## 2020-12-01 MED ORDER — THIAMINE HCL 100 MG PO TABS: 100.0000 mg | ORAL_TABLET | Freq: Every day | ORAL | Status: DC

## 2020-12-01 MED ORDER — LACTATED RINGERS IV SOLN: INTRAVENOUS | Status: DC | PRN

## 2020-12-01 MED ORDER — STERILE WATER FOR IRRIGATION IR SOLN
Status: DC | PRN
  Administered 2020-12-01: 1000 mL

## 2020-12-01 MED ORDER — LORAZEPAM 2 MG/ML IJ SOLN: 1.0000 mg | INTRAMUSCULAR | Status: DC | PRN

## 2020-12-01 MED ORDER — LACTATED RINGERS IV SOLN: INTRAVENOUS | Status: DC

## 2020-12-01 MED ORDER — FOLIC ACID 1 MG PO TABS
1.0000 mg | ORAL_TABLET | Freq: Every day | ORAL | Status: DC
  Filled 2020-12-01: qty 1

## 2020-12-01 MED ORDER — SODIUM CHLORIDE 0.9 % IV SOLN: INTRAVENOUS | Status: DC | PRN

## 2020-12-01 MED ORDER — ACETAMINOPHEN 10 MG/ML IV SOLN
1000.0000 mg | Freq: Once | INTRAVENOUS | Status: DC | PRN
  Administered 2020-12-01: 1000 mg via INTRAVENOUS

## 2020-12-01 MED ORDER — HYDROMORPHONE HCL 1 MG/ML IJ SOLN
INTRAMUSCULAR | Status: AC
  Filled 2020-12-01: qty 1

## 2020-12-01 MED ORDER — PHENYLEPHRINE 40 MCG/ML (10ML) SYRINGE FOR IV PUSH (FOR BLOOD PRESSURE SUPPORT)
PREFILLED_SYRINGE | INTRAVENOUS | Status: AC
  Filled 2020-12-01: qty 10

## 2020-12-01 MED ORDER — ACETAMINOPHEN 10 MG/ML IV SOLN
INTRAVENOUS | Status: AC
  Filled 2020-12-01: qty 100

## 2020-12-01 MED ORDER — OXYCODONE HCL 5 MG/5ML PO SOLN: 5.0000 mg | Freq: Once | ORAL | Status: DC | PRN

## 2020-12-01 MED ORDER — PHENYLEPHRINE HCL (PRESSORS) 10 MG/ML IV SOLN
INTRAVENOUS | Status: DC | PRN
  Administered 2020-12-01 (×2): 40 ug via INTRAVENOUS

## 2020-12-01 MED ORDER — PROPOFOL 10 MG/ML IV BOLUS
INTRAVENOUS | Status: AC
  Filled 2020-12-01: qty 20

## 2020-12-01 MED ORDER — SUGAMMADEX SODIUM 200 MG/2ML IV SOLN
INTRAVENOUS | Status: DC | PRN
  Administered 2020-12-01: 300 mg via INTRAVENOUS

## 2020-12-01 MED ORDER — 0.9 % SODIUM CHLORIDE (POUR BTL) OPTIME
TOPICAL | Status: DC | PRN
  Administered 2020-12-01 (×2): 1000 mL

## 2020-12-01 MED ORDER — DEXAMETHASONE SODIUM PHOSPHATE 10 MG/ML IJ SOLN
INTRAMUSCULAR | Status: DC | PRN
  Administered 2020-12-01: 10 mg via INTRAVENOUS

## 2020-12-01 MED ORDER — ADULT MULTIVITAMIN W/MINERALS CH
1.0000 | ORAL_TABLET | Freq: Every day | ORAL | Status: DC
  Filled 2020-12-01: qty 1

## 2020-12-01 MED ORDER — BACITRACIN-NEOMYCIN-POLYMYXIN 400-5-5000 EX OINT
TOPICAL_OINTMENT | CUTANEOUS | Status: DC | PRN
  Administered 2020-12-01: 1 via TOPICAL

## 2020-12-01 MED ORDER — THIAMINE HCL 100 MG/ML IJ SOLN
100.0000 mg | Freq: Every day | INTRAMUSCULAR | Status: DC
  Administered 2020-12-02 (×2): 100 mg via INTRAVENOUS
  Filled 2020-12-01 (×2): qty 2

## 2020-12-01 SURGICAL SUPPLY — 54 items
APL PRP STRL LF DISP 70% ISPRP (MISCELLANEOUS) ×1
BLADE CLIPPER SURG (BLADE) IMPLANT
CANISTER SUCT 3000ML PPV (MISCELLANEOUS) ×2 IMPLANT
CHLORAPREP W/TINT 26 (MISCELLANEOUS) ×2 IMPLANT
COVER SURGICAL LIGHT HANDLE (MISCELLANEOUS) ×2 IMPLANT
DRAIN CHANNEL 19F RND (DRAIN) ×1 IMPLANT
DRAPE LAPAROSCOPIC ABDOMINAL (DRAPES) ×2 IMPLANT
DRAPE WARM FLUID 44X44 (DRAPES) ×2 IMPLANT
DRSG COVADERM 4X14 (GAUZE/BANDAGES/DRESSINGS) ×1 IMPLANT
DRSG OPSITE POSTOP 4X10 (GAUZE/BANDAGES/DRESSINGS) IMPLANT
DRSG OPSITE POSTOP 4X8 (GAUZE/BANDAGES/DRESSINGS) IMPLANT
ELECT BLADE 6.5 EXT (BLADE) IMPLANT
ELECT CAUTERY BLADE 6.4 (BLADE) ×2 IMPLANT
ELECT REM PT RETURN 9FT ADLT (ELECTROSURGICAL) ×2
ELECTRODE REM PT RTRN 9FT ADLT (ELECTROSURGICAL) ×1 IMPLANT
EVACUATOR SILICONE 100CC (DRAIN) ×1 IMPLANT
GAUZE SPONGE 4X4 12PLY STRL LF (GAUZE/BANDAGES/DRESSINGS) ×1 IMPLANT
GLOVE BIO SURGEON STRL SZ7.5 (GLOVE) ×2 IMPLANT
GLOVE SRG 8 PF TXTR STRL LF DI (GLOVE) ×1 IMPLANT
GLOVE SURG POLYISO LF SZ8 (GLOVE) ×1 IMPLANT
GLOVE SURG SS PI 7.5 STRL IVOR (GLOVE) ×2 IMPLANT
GLOVE SURG UNDER POLY LF SZ8 (GLOVE) ×2
GOWN STRL REUS W/ TWL LRG LVL3 (GOWN DISPOSABLE) ×1 IMPLANT
GOWN STRL REUS W/ TWL XL LVL3 (GOWN DISPOSABLE) ×1 IMPLANT
GOWN STRL REUS W/TWL 2XL LVL3 (GOWN DISPOSABLE) ×2 IMPLANT
GOWN STRL REUS W/TWL LRG LVL3 (GOWN DISPOSABLE) ×4
GOWN STRL REUS W/TWL XL LVL3 (GOWN DISPOSABLE) ×4
HANDLE SUCTION POOLE (INSTRUMENTS) ×1 IMPLANT
KIT BASIN OR (CUSTOM PROCEDURE TRAY) ×2 IMPLANT
KIT TURNOVER KIT B (KITS) ×2 IMPLANT
LIGASURE IMPACT 36 18CM CVD LR (INSTRUMENTS) IMPLANT
MANIFOLD NEPTUNE II (INSTRUMENTS) ×1 IMPLANT
NS IRRIG 1000ML POUR BTL (IV SOLUTION) ×4 IMPLANT
PACK GENERAL/GYN (CUSTOM PROCEDURE TRAY) ×2 IMPLANT
PAD ARMBOARD 7.5X6 YLW CONV (MISCELLANEOUS) ×2 IMPLANT
PENCIL SMOKE EVACUATOR (MISCELLANEOUS) ×2 IMPLANT
SPECIMEN JAR LARGE (MISCELLANEOUS) IMPLANT
SPONGE LAP 18X18 RF (DISPOSABLE) ×1 IMPLANT
STAPLER VISISTAT 35W (STAPLE) ×1 IMPLANT
SUCTION POOLE HANDLE (INSTRUMENTS) ×2
SUT ETHILON 2 0 PSLX (SUTURE) ×2 IMPLANT
SUT PDS AB 1 TP1 54 (SUTURE) ×4 IMPLANT
SUT PDS AB 2-0 CT1 27 (SUTURE) ×2 IMPLANT
SUT SILK 2 0 SH CR/8 (SUTURE) ×2 IMPLANT
SUT SILK 2 0 TIES 10X30 (SUTURE) ×1 IMPLANT
SUT SILK 3 0 SH CR/8 (SUTURE) ×1 IMPLANT
SUT SILK 3 0 TIES 10X30 (SUTURE) ×2 IMPLANT
SUT SILK 3 0SH CR/8 30 (SUTURE) ×1 IMPLANT
SUT VIC AB 3-0 SH 18 (SUTURE) ×3 IMPLANT
TOWEL GREEN STERILE (TOWEL DISPOSABLE) ×2 IMPLANT
TRAY FOL W/BAG SLVR 16FR STRL (SET/KITS/TRAYS/PACK) IMPLANT
TRAY FOLEY MTR SLVR 16FR STAT (SET/KITS/TRAYS/PACK) ×2 IMPLANT
TRAY FOLEY W/BAG SLVR 16FR LF (SET/KITS/TRAYS/PACK) ×2
YANKAUER SUCT BULB TIP NO VENT (SUCTIONS) IMPLANT

## 2020-12-01 NOTE — Progress Notes (Signed)
Orthopedic Tech Progress Note Patient Details:  [name] 10/17/1876 [id number] Level 1 trauma Patient ID: [name], male   DOB: 10/17/1876, 65 y.o.   MRN: [id number]   Michelle Piper 12/01/2020, 6:40 PM
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
## 2020-12-01 NOTE — Transfer of Care (Signed)
Immediate Anesthesia Transfer of Care Note  Patient: [name]  Procedure(s) Performed: EXPLORATORY LAPAROTOMY (N/A )  Patient Location: PACU  Anesthesia Type:General  Level of Consciousness: awake, alert  and sedated  Airway & Oxygen Therapy: Patient Spontanous Breathing and Patient connected to face mask oxygen  Post-op Assessment: Report given to RN and Post -op Vital signs reviewed and stable  Post vital signs: Reviewed and stable  Last Vitals:  Vitals Value Taken Time  BP 154/84 12/01/20 2157  Temp    Pulse 64 12/01/20 2159  Resp 9 12/01/20 2159  SpO2 100 % 12/01/20 2159  Vitals shown include unvalidated device data.  Last Pain:  Vitals:   12/01/20 1840  TempSrc: Temporal         Complications: No complications documented.
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
## 2020-12-01 NOTE — Anesthesia Procedure Notes (Signed)
Procedure Name: Intubation Date/Time: 12/01/2020 7:28 PM Performed by: Molli Hazard, CRNA Pre-anesthesia Checklist: Patient identified, Emergency Drugs available, Suction available and Patient being monitored Patient Re-evaluated:Patient Re-evaluated prior to induction Oxygen Delivery Method: Circle system utilized Preoxygenation: Pre-oxygenation with 100% oxygen Induction Type: IV induction and Rapid sequence Laryngoscope Size: Miller and 2 Grade View: Grade I Tube type: Oral Tube size: 7.5 mm Number of attempts: 1 Airway Equipment and Method: Stylet Placement Confirmation: ETT inserted through vocal cords under direct vision,  breath sounds checked- equal and bilateral and positive ETCO2 Secured at: 22 cm Tube secured with: Tape Dental Injury: Teeth and Oropharynx as per pre-operative assessment
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
## 2020-12-02 ENCOUNTER — Encounter (HOSPITAL_COMMUNITY): Payer: Self-pay | Admitting: Surgery

## 2020-12-02 DIAGNOSIS — R45851 Suicidal ideations: Secondary | ICD-10-CM

## 2020-12-02 DIAGNOSIS — F322 Major depressive disorder, single episode, severe without psychotic features: Secondary | ICD-10-CM

## 2020-12-02 DIAGNOSIS — T1491XA Suicide attempt, initial encounter: Secondary | ICD-10-CM

## 2020-12-02 DIAGNOSIS — S36439S Laceration of unspecified part of small intestine, sequela: Secondary | ICD-10-CM

## 2020-12-02 DIAGNOSIS — F29 Unspecified psychosis not due to a substance or known physiological condition: Secondary | ICD-10-CM

## 2020-12-02 DIAGNOSIS — G4700 Insomnia, unspecified: Secondary | ICD-10-CM

## 2020-12-02 DIAGNOSIS — F1024 Alcohol dependence with alcohol-induced mood disorder: Secondary | ICD-10-CM

## 2020-12-02 LAB — COMPREHENSIVE METABOLIC PANEL
ALT: 29 U/L (ref 0–44)
AST: 34 U/L (ref 15–41)
Albumin: 2.9 g/dL — ABNORMAL LOW (ref 3.5–5.0)
Alkaline Phosphatase: 57 U/L (ref 38–126)
Anion gap: 10 (ref 5–15)
BUN: 7 mg/dL — ABNORMAL LOW (ref 8–23)
CO2: 21 mmol/L — ABNORMAL LOW (ref 22–32)
Calcium: 8.5 mg/dL — ABNORMAL LOW (ref 8.9–10.3)
Chloride: 108 mmol/L (ref 98–111)
Creatinine, Ser: 0.85 mg/dL (ref 0.61–1.24)
GFR, Estimated: >60 mL/min (ref >60)
Glucose, Bld: 141 mg/dL — ABNORMAL HIGH (ref 70–99)
Potassium: 4.1 mmol/L (ref 3.5–5.1)
Sodium: 139 mmol/L (ref 135–145)
Total Bilirubin: 0.8 mg/dL (ref 0.3–1.2)
Total Protein: 6.5 g/dL (ref 6.5–8.1)

## 2020-12-02 LAB — CBC
HCT: 38.7 % — ABNORMAL LOW (ref 39.0–52.0)
Hemoglobin: 12 g/dL — ABNORMAL LOW (ref 13.0–17.0)
MCH: 25.9 pg — ABNORMAL LOW (ref 26.0–34.0)
MCHC: 31 g/dL (ref 30.0–36.0)
MCV: 83.4 fL (ref 80.0–100.0)
Platelets: 105 10*3/uL — ABNORMAL LOW (ref 150–400)
RBC: 4.64 MIL/uL (ref 4.22–5.81)
RDW: 18.7 % — ABNORMAL HIGH (ref 11.5–15.5)
WBC: 6.3 10*3/uL (ref 4.0–10.5)
nRBC: 0 % (ref 0.0–0.2)

## 2020-12-02 LAB — PHOSPHORUS: Phosphorus: 3.8 mg/dL (ref 2.5–4.6)

## 2020-12-02 LAB — RAPID URINE DRUG SCREEN, HOSP PERFORMED
Amphetamines: NOT DETECTED
Barbiturates: NOT DETECTED
Benzodiazepines: POSITIVE — AB
Cocaine: NOT DETECTED
Opiates: POSITIVE — AB
Tetrahydrocannabinol: NOT DETECTED

## 2020-12-02 LAB — MAGNESIUM: Magnesium: 1.5 mg/dL — ABNORMAL LOW (ref 1.7–2.4)

## 2020-12-02 LAB — AMMONIA: Ammonia: 30 umol/L (ref 9–35)

## 2020-12-02 MED ORDER — ACETAMINOPHEN 325 MG PO TABS
650.0000 mg | ORAL_TABLET | Freq: Four times a day (QID) | ORAL | Status: DC
  Administered 2020-12-02 (×2): 650 mg via ORAL
  Filled 2020-12-02 (×2): qty 2

## 2020-12-02 MED ORDER — ARIPIPRAZOLE 10 MG PO TABS: 5.0000 mg | ORAL_TABLET | Freq: Every day | ORAL | Status: DC

## 2020-12-02 MED ORDER — ARIPIPRAZOLE 10 MG PO TABS
5.0000 mg | ORAL_TABLET | Freq: Every day | ORAL | Status: DC
  Administered 2020-12-03 – 2020-12-05 (×3): 5 mg
  Filled 2020-12-02 (×3): qty 1

## 2020-12-02 MED ORDER — PHENOL 1.4 % MT LIQD
1.0000 | OROMUCOSAL | Status: DC | PRN
  Administered 2021-01-13 – 2021-01-15 (×4): 1 via OROMUCOSAL
  Filled 2020-12-02 (×3): qty 177

## 2020-12-02 MED ORDER — OXYCODONE HCL 5 MG PO TABS
5.0000 mg | ORAL_TABLET | ORAL | Status: DC | PRN
  Administered 2020-12-02 (×2): 5 mg via ORAL
  Filled 2020-12-02 (×2): qty 1

## 2020-12-02 MED ORDER — ACETAMINOPHEN 160 MG/5ML PO SOLN
650.0000 mg | Freq: Four times a day (QID) | ORAL | Status: DC
  Administered 2020-12-02 – 2020-12-05 (×11): 650 mg
  Filled 2020-12-02 (×11): qty 20.3

## 2020-12-02 MED ORDER — ARIPIPRAZOLE 2 MG PO TABS
2.0000 mg | ORAL_TABLET | Freq: Once | ORAL | Status: DC
  Filled 2020-12-02: qty 1

## 2020-12-02 MED ORDER — GABAPENTIN 250 MG/5ML PO SOLN
300.0000 mg | Freq: Three times a day (TID) | ORAL | Status: DC
  Administered 2020-12-02 – 2020-12-07 (×14): 300 mg
  Filled 2020-12-02 (×16): qty 6

## 2020-12-02 MED ORDER — ARIPIPRAZOLE 2 MG PO TABS
2.0000 mg | ORAL_TABLET | Freq: Once | ORAL | Status: AC
  Administered 2020-12-02: 2 mg
  Filled 2020-12-02: qty 1

## 2020-12-02 MED ORDER — METHOCARBAMOL 1000 MG/10ML IJ SOLN
500.0000 mg | Freq: Three times a day (TID) | INTRAVENOUS | Status: DC
  Administered 2020-12-02 – 2020-12-07 (×14): 500 mg via INTRAVENOUS
  Filled 2020-12-02 (×12): qty 5
  Filled 2020-12-02: qty 500
  Filled 2020-12-02: qty 5

## 2020-12-02 MED ORDER — AMITRIPTYLINE HCL 25 MG PO TABS: 25.0000 mg | ORAL_TABLET | Freq: Every day | ORAL | Status: DC

## 2020-12-02 MED ORDER — THIAMINE HCL 100 MG PO TABS
100.0000 mg | ORAL_TABLET | Freq: Every day | ORAL | Status: DC
  Administered 2020-12-03 – 2020-12-08 (×6): 100 mg
  Filled 2020-12-02 (×7): qty 1

## 2020-12-02 MED ORDER — ACETAMINOPHEN 160 MG/5ML PO SOLN: 650.0000 mg | Freq: Four times a day (QID) | ORAL | Status: DC

## 2020-12-02 MED ORDER — LORAZEPAM 1 MG PO TABS: 1.0000 mg | ORAL_TABLET | ORAL | Status: AC | PRN

## 2020-12-02 MED ORDER — OXYCODONE HCL 5 MG PO TABS
5.0000 mg | ORAL_TABLET | ORAL | Status: DC | PRN
  Administered 2020-12-02 – 2020-12-09 (×32): 5 mg
  Filled 2020-12-02 (×31): qty 1

## 2020-12-02 MED ORDER — FOLIC ACID 1 MG PO TABS
1.0000 mg | ORAL_TABLET | Freq: Every day | ORAL | Status: DC
  Administered 2020-12-03 – 2020-12-08 (×6): 1 mg
  Filled 2020-12-02 (×6): qty 1

## 2020-12-02 MED ORDER — GABAPENTIN 300 MG PO CAPS
300.0000 mg | ORAL_CAPSULE | Freq: Three times a day (TID) | ORAL | Status: DC
  Administered 2020-12-02: 300 mg via ORAL
  Filled 2020-12-02: qty 1

## 2020-12-02 MED ORDER — HYDROMORPHONE HCL 1 MG/ML IJ SOLN
1.0000 mg | INTRAMUSCULAR | Status: DC | PRN
  Administered 2020-12-02 – 2020-12-03 (×5): 1 mg via INTRAVENOUS
  Filled 2020-12-02 (×5): qty 1

## 2020-12-02 MED ORDER — AMITRIPTYLINE HCL 25 MG PO TABS
25.0000 mg | ORAL_TABLET | Freq: Every day | ORAL | Status: DC
  Administered 2020-12-02 – 2020-12-04 (×3): 25 mg
  Filled 2020-12-02 (×3): qty 1

## 2020-12-02 MED ORDER — LIDOCAINE 5 % EX PTCH
1.0000 | MEDICATED_PATCH | CUTANEOUS | Status: DC
  Administered 2020-12-02 – 2021-01-10 (×10): 1 via TRANSDERMAL
  Filled 2020-12-02 (×22): qty 1

## 2020-12-02 MED ORDER — LORAZEPAM 2 MG/ML IJ SOLN: 1.0000 mg | INTRAMUSCULAR | Status: AC | PRN

## 2020-12-02 MED ORDER — KETOROLAC TROMETHAMINE 15 MG/ML IJ SOLN: 15.0000 mg | Freq: Three times a day (TID) | INTRAMUSCULAR | Status: DC

## 2020-12-02 MED ORDER — ADULT MULTIVITAMIN W/MINERALS CH
1.0000 | ORAL_TABLET | Freq: Every day | ORAL | Status: DC
  Administered 2020-12-03 – 2020-12-08 (×6): 1
  Filled 2020-12-02 (×7): qty 1
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
## 2020-12-02 NOTE — Progress Notes (Incomplete)
400 ml in & out
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
## 2020-12-02 NOTE — Plan of Care (Signed)
Entered room with nursing student to obtain VS. Patient refused nursing care, stating that he wanted to see the doctor and the psychiatrist. Unable to obtain VS at this time.
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
## 2020-12-03 LAB — AMMONIA: Ammonia: 27 umol/L (ref 9–35)

## 2020-12-03 LAB — COMPREHENSIVE METABOLIC PANEL
ALT: 24 U/L (ref 0–44)
AST: 27 U/L (ref 15–41)
Albumin: 2.6 g/dL — ABNORMAL LOW (ref 3.5–5.0)
Alkaline Phosphatase: 43 U/L (ref 38–126)
Anion gap: 8 (ref 5–15)
BUN: 10 mg/dL (ref 8–23)
CO2: 24 mmol/L (ref 22–32)
Calcium: 8.3 mg/dL — ABNORMAL LOW (ref 8.9–10.3)
Chloride: 106 mmol/L (ref 98–111)
Creatinine, Ser: 0.86 mg/dL (ref 0.61–1.24)
GFR, Estimated: >60 mL/min (ref >60)
Glucose, Bld: 110 mg/dL — ABNORMAL HIGH (ref 70–99)
Potassium: 3.8 mmol/L (ref 3.5–5.1)
Sodium: 138 mmol/L (ref 135–145)
Total Bilirubin: 1.6 mg/dL — ABNORMAL HIGH (ref 0.3–1.2)
Total Protein: 5.8 g/dL — ABNORMAL LOW (ref 6.5–8.1)

## 2020-12-03 LAB — CBC
HCT: 32.1 % — ABNORMAL LOW (ref 39.0–52.0)
Hemoglobin: 10.5 g/dL — ABNORMAL LOW (ref 13.0–17.0)
MCH: 26.9 pg (ref 26.0–34.0)
MCHC: 32.7 g/dL (ref 30.0–36.0)
MCV: 82.3 fL (ref 80.0–100.0)
Platelets: 97 10*3/uL — ABNORMAL LOW (ref 150–400)
RBC: 3.9 MIL/uL — ABNORMAL LOW (ref 4.22–5.81)
RDW: 18.6 % — ABNORMAL HIGH (ref 11.5–15.5)
WBC: 8.7 10*3/uL (ref 4.0–10.5)
nRBC: 0 % (ref 0.0–0.2)

## 2020-12-03 MED ORDER — HYDRALAZINE HCL 20 MG/ML IJ SOLN
10.0000 mg | Freq: Three times a day (TID) | INTRAMUSCULAR | Status: DC | PRN
Start: 2020-12-03 — End: 2021-01-18
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
## 2020-12-03 NOTE — TOC CAGE-AID Note (Signed)
Transition of Care [facility]) - CAGE-AID Screening   Patient Details  Name: [name] MRN: [id number] Date of Birth: 07/01/1956  Transition of Care [facility]) CM/SW Contact:    Jimmy Picket, [place] Phone Number: 12/03/2020, 4:34 PM   Clinical Narrative:  Pt reports he drinks "a case" of beer daily. Pt reports he smoked crack 2 days before he came into the hospital. Pt reports that was the first time in 30 years. Pt declined resources. Pt reports "They are working on getting me to [facility] and ill stay there long term and get all everything I need there".  CAGE-AID Screening:    Have You Ever Felt You Ought to Cut Down on Your Drinking or Drug Use?: Yes Have People Annoyed You By Critizing Your Drinking Or Drug Use?: No Have You Felt Bad Or Guilty About Your Drinking Or Drug Use?: No Have You Ever Had a Drink or Used Drugs First Thing In The Morning to Steady Your Nerves or to Get Rid of a Hangover?: No CAGE-AID Score: 1  Substance Abuse Education Offered: Yes     Denita Lung, Bridget Hartshorn Clinical Social Worker [phone]

## 2020-12-03 NOTE — Progress Notes (Signed)
   12/03/20 1012  Clinical Encounter Type  Visited With Patient  Visit Type Follow-up;Social support;Spiritual support  Spiritual Encounters  Spiritual Needs Prayer  Stress Factors  Patient Stress Factors Financial concerns   Chaplain followed-up with Pt. Pt was in better spirits then yesterday, which he attributed to learning he would get inpatient psychiatric help upon discharge from [facility]. Chaplain engaged active listening, emotional and spiritual support, and prayed with Pt. Pt stated he was hoping to get his food stamps card reset and also reading glasses, which this Chaplain mentioned he should bring up with Social Work next time they visit. Pt was able to speak with his brother [name] yesterday and is waiting to hear back from him. Chaplain remains available.  This note was prepared by Chaplain Resident, [name], MDiv. For questions, please contact by phone at [phone].
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
## 2020-12-04 LAB — COMPREHENSIVE METABOLIC PANEL
ALT: 21 U/L (ref 0–44)
AST: 23 U/L (ref 15–41)
Albumin: 2.7 g/dL — ABNORMAL LOW (ref 3.5–5.0)
Alkaline Phosphatase: 43 U/L (ref 38–126)
Anion gap: 7 (ref 5–15)
BUN: 9 mg/dL (ref 8–23)
CO2: 25 mmol/L (ref 22–32)
Calcium: 8.2 mg/dL — ABNORMAL LOW (ref 8.9–10.3)
Chloride: 105 mmol/L (ref 98–111)
Creatinine, Ser: 0.95 mg/dL (ref 0.61–1.24)
GFR, Estimated: >60 mL/min (ref >60)
Glucose, Bld: 94 mg/dL (ref 70–99)
Potassium: 3.3 mmol/L — ABNORMAL LOW (ref 3.5–5.1)
Sodium: 137 mmol/L (ref 135–145)
Total Bilirubin: 1.4 mg/dL — ABNORMAL HIGH (ref 0.3–1.2)
Total Protein: 5.6 g/dL — ABNORMAL LOW (ref 6.5–8.1)

## 2020-12-04 LAB — CBC
HCT: 32 % — ABNORMAL LOW (ref 39.0–52.0)
Hemoglobin: 10.6 g/dL — ABNORMAL LOW (ref 13.0–17.0)
MCH: 26.9 pg (ref 26.0–34.0)
MCHC: 33.1 g/dL (ref 30.0–36.0)
MCV: 81.2 fL (ref 80.0–100.0)
Platelets: 85 10*3/uL — ABNORMAL LOW (ref 150–400)
RBC: 3.94 MIL/uL — ABNORMAL LOW (ref 4.22–5.81)
RDW: 18.1 % — ABNORMAL HIGH (ref 11.5–15.5)
WBC: 5.3 10*3/uL (ref 4.0–10.5)
nRBC: 0 % (ref 0.0–0.2)

## 2020-12-04 MED ORDER — PANTOPRAZOLE SODIUM 40 MG IV SOLR
40.0000 mg | INTRAVENOUS | Status: DC
  Administered 2020-12-04: 40 mg via INTRAVENOUS
  Filled 2020-12-04: qty 40

## 2020-12-04 NOTE — Progress Notes (Signed)
   12/04/20 1558  Clinical Encounter Type  Visited With Patient  Visit Type Follow-up   Chaplain followed up with Pt. Chaplain engaged active listening and emotional support. Pt continues to make progress physically. Pt is hopeful for discharge to psychiatric facility. Pt stated he has been able to rest the past day. Pt wishes he could see the same doctor each day. Pt is planning on calling his brother tomorrow. Chaplain remains available.  This note was prepared by Chaplain Resident, [name], MDiv. Chaplain remains available as needed through the on-call pager: [phone].
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
## 2020-12-04 NOTE — Plan of Care (Signed)
  Problem: Education: Goal: Knowledge of General Education information will improve Description Including pain rating scale, medication(s)/side effects and non-pharmacologic comfort measures Outcome: Progressing   

## 2020-12-05 DIAGNOSIS — S36439A Laceration of unspecified part of small intestine, initial encounter: Secondary | ICD-10-CM

## 2020-12-05 DIAGNOSIS — K5909 Other constipation: Secondary | ICD-10-CM

## 2020-12-05 DIAGNOSIS — T1491XA Suicide attempt, initial encounter: Secondary | ICD-10-CM

## 2020-12-05 DIAGNOSIS — E669 Obesity, unspecified: Secondary | ICD-10-CM

## 2020-12-05 DIAGNOSIS — K7460 Unspecified cirrhosis of liver: Secondary | ICD-10-CM | POA: Diagnosis present

## 2020-12-05 DIAGNOSIS — F1010 Alcohol abuse, uncomplicated: Secondary | ICD-10-CM | POA: Diagnosis present

## 2020-12-05 DIAGNOSIS — X838XXA Intentional self-harm by other specified means, initial encounter: Secondary | ICD-10-CM

## 2020-12-05 LAB — BASIC METABOLIC PANEL
Anion gap: 10 (ref 5–15)
BUN: 6 mg/dL — ABNORMAL LOW (ref 8–23)
CO2: 24 mmol/L (ref 22–32)
Calcium: 8.4 mg/dL — ABNORMAL LOW (ref 8.9–10.3)
Chloride: 102 mmol/L (ref 98–111)
Creatinine, Ser: 0.92 mg/dL (ref 0.61–1.24)
GFR, Estimated: >60 mL/min (ref >60)
Glucose, Bld: 112 mg/dL — ABNORMAL HIGH (ref 70–99)
Potassium: 3.6 mmol/L (ref 3.5–5.1)
Sodium: 136 mmol/L (ref 135–145)

## 2020-12-05 LAB — CBC
HCT: 34.6 % — ABNORMAL LOW (ref 39.0–52.0)
Hemoglobin: 10.8 g/dL — ABNORMAL LOW (ref 13.0–17.0)
MCH: 25.8 pg — ABNORMAL LOW (ref 26.0–34.0)
MCHC: 31.2 g/dL (ref 30.0–36.0)
MCV: 82.8 fL (ref 80.0–100.0)
Platelets: 100 10*3/uL — ABNORMAL LOW (ref 150–400)
RBC: 4.18 MIL/uL — ABNORMAL LOW (ref 4.22–5.81)
RDW: 18 % — ABNORMAL HIGH (ref 11.5–15.5)
WBC: 6.1 10*3/uL (ref 4.0–10.5)
nRBC: 0 % (ref 0.0–0.2)

## 2020-12-05 MED ORDER — LACTATED RINGERS IV BOLUS: 1000.0000 mL | Freq: Three times a day (TID) | INTRAVENOUS | Status: AC | PRN

## 2020-12-05 MED ORDER — METHOCARBAMOL 1000 MG/10ML IJ SOLN
1000.0000 mg | Freq: Four times a day (QID) | INTRAVENOUS | Status: DC | PRN
  Administered 2020-12-08: 1000 mg via INTRAVENOUS
  Filled 2020-12-05 (×2): qty 10

## 2020-12-05 MED ORDER — LACTULOSE 10 GM/15ML PO SOLN
20.0000 g | Freq: Two times a day (BID) | ORAL | Status: DC | PRN
  Administered 2020-12-06 – 2020-12-19 (×9): 20 g via ORAL
  Filled 2020-12-05 (×9): qty 30

## 2020-12-05 MED ORDER — SODIUM CHLORIDE 0.9% FLUSH: 3.0000 mL | INTRAVENOUS | Status: DC | PRN

## 2020-12-05 MED ORDER — METOPROLOL TARTRATE 12.5 MG HALF TABLET
12.5000 mg | ORAL_TABLET | Freq: Two times a day (BID) | ORAL | Status: DC | PRN
  Filled 2020-12-05: qty 1

## 2020-12-05 MED ORDER — CALCIUM POLYCARBOPHIL 625 MG PO TABS
625.0000 mg | ORAL_TABLET | Freq: Two times a day (BID) | ORAL | Status: DC
  Administered 2020-12-05 – 2021-01-18 (×84): 625 mg via ORAL
  Filled 2020-12-05 (×90): qty 1

## 2020-12-05 MED ORDER — ARIPIPRAZOLE 10 MG PO TABS
5.0000 mg | ORAL_TABLET | Freq: Every day | ORAL | Status: DC
  Administered 2020-12-06 – 2020-12-09 (×4): 5 mg via ORAL
  Filled 2020-12-05 (×4): qty 1

## 2020-12-05 MED ORDER — AMITRIPTYLINE HCL 25 MG PO TABS
25.0000 mg | ORAL_TABLET | Freq: Every day | ORAL | Status: DC
  Administered 2020-12-05 – 2020-12-08 (×4): 25 mg via ORAL
  Filled 2020-12-05 (×4): qty 1

## 2020-12-05 MED ORDER — LIP MEDEX EX OINT
1.0000 "application " | TOPICAL_OINTMENT | Freq: Two times a day (BID) | CUTANEOUS | Status: DC
  Administered 2020-12-05 – 2021-01-18 (×60): 1 via TOPICAL
  Filled 2020-12-05: qty 7

## 2020-12-05 MED ORDER — MAGIC MOUTHWASH
15.0000 mL | Freq: Four times a day (QID) | ORAL | Status: DC | PRN
  Filled 2020-12-05: qty 15

## 2020-12-05 MED ORDER — ENALAPRILAT 1.25 MG/ML IV SOLN
0.6250 mg | Freq: Four times a day (QID) | INTRAVENOUS | Status: DC | PRN
  Filled 2020-12-05: qty 1

## 2020-12-05 MED ORDER — SODIUM CHLORIDE 0.9 % IV SOLN: 250.0000 mL | INTRAVENOUS | Status: DC | PRN

## 2020-12-05 MED ORDER — PANTOPRAZOLE SODIUM 40 MG PO TBEC
40.0000 mg | DELAYED_RELEASE_TABLET | Freq: Every day | ORAL | Status: DC
  Administered 2020-12-05 – 2021-01-17 (×41): 40 mg via ORAL
  Filled 2020-12-05 (×39): qty 1

## 2020-12-05 MED ORDER — SODIUM CHLORIDE 0.9% FLUSH
3.0000 mL | Freq: Two times a day (BID) | INTRAVENOUS | Status: DC
  Administered 2020-12-05 – 2020-12-23 (×24): 3 mL via INTRAVENOUS
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
## 2020-12-06 DIAGNOSIS — K432 Incisional hernia without obstruction or gangrene: Secondary | ICD-10-CM

## 2020-12-06 MED ORDER — LACTULOSE 10 GM/15ML PO SOLN
20.0000 g | Freq: Every day | ORAL | Status: DC
  Administered 2020-12-06 – 2020-12-19 (×13): 20 g via ORAL
  Filled 2020-12-06 (×14): qty 30

## 2020-12-06 MED ORDER — BISACODYL 10 MG RE SUPP
10.0000 mg | Freq: Two times a day (BID) | RECTAL | Status: DC | PRN
  Administered 2020-12-08: 10 mg via RECTAL
  Filled 2020-12-06: qty 1
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
## 2020-12-07 LAB — MAGNESIUM: Magnesium: 1.6 mg/dL — ABNORMAL LOW (ref 1.7–2.4)

## 2020-12-07 LAB — CBC
HCT: 37.6 % — ABNORMAL LOW (ref 39.0–52.0)
Hemoglobin: 11.8 g/dL — ABNORMAL LOW (ref 13.0–17.0)
MCH: 25.8 pg — ABNORMAL LOW (ref 26.0–34.0)
MCHC: 31.4 g/dL (ref 30.0–36.0)
MCV: 82.3 fL (ref 80.0–100.0)
Platelets: 132 10*3/uL — ABNORMAL LOW (ref 150–400)
RBC: 4.57 MIL/uL (ref 4.22–5.81)
RDW: 18.1 % — ABNORMAL HIGH (ref 11.5–15.5)
WBC: 7.3 10*3/uL (ref 4.0–10.5)
nRBC: 0 % (ref 0.0–0.2)

## 2020-12-07 LAB — POTASSIUM: Potassium: 4.4 mmol/L (ref 3.5–5.1)

## 2020-12-07 MED ORDER — MAGNESIUM SULFATE 2 GM/50ML IV SOLN
2.0000 g | Freq: Once | INTRAVENOUS | Status: AC
  Administered 2020-12-07: 2 g via INTRAVENOUS
  Filled 2020-12-07: qty 50

## 2020-12-07 MED ORDER — GABAPENTIN 300 MG PO CAPS
300.0000 mg | ORAL_CAPSULE | Freq: Three times a day (TID) | ORAL | Status: DC
  Administered 2020-12-07 – 2020-12-14 (×19): 300 mg via ORAL
  Filled 2020-12-07 (×19): qty 1

## 2020-12-07 MED ORDER — DOCUSATE SODIUM 100 MG PO CAPS
100.0000 mg | ORAL_CAPSULE | Freq: Two times a day (BID) | ORAL | Status: DC
  Administered 2020-12-07 – 2021-01-18 (×79): 100 mg via ORAL
  Filled 2020-12-07 (×85): qty 1

## 2020-12-07 MED ORDER — MELATONIN 5 MG PO TABS
5.0000 mg | ORAL_TABLET | Freq: Every evening | ORAL | Status: DC | PRN
  Administered 2020-12-07 – 2020-12-28 (×19): 5 mg via ORAL
  Filled 2020-12-07 (×20): qty 1
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
## 2020-12-07 NOTE — Progress Notes (Signed)
Sitter at bedside with pt for suicide precautions. Pt denies any ideation of self harm or harm to others. Pt encouraged to talk with MD or staff members as needed if any feelings or desires occur.

## 2020-12-07 NOTE — Progress Notes (Signed)
Pt in room with sitter at bedside. Pt comfortable.

## 2020-12-07 NOTE — TOC Progression Note (Signed)
Transition of Care [facility]) - Progression Note    Patient Details  Name: [name] MRN: [id number] Date of Birth: Mar 15, 1956  [facility] CM/SW Contact  Astrid Drafts Berna Spare, RN Phone Number: 12/07/2020, 4:33 PM  Clinical Narrative:  [facility] Case Manager continues to follow for medical readiness for inpatient psych admission.  Per provider, still waiting on return of bowel function s/p surgery.  Once medically stable, will be able to submit referrals for possible geri-psych admission.  Anticipate [facility] admission.        Expected Discharge Plan: Psychiatric Hospital Barriers to Discharge: Continued Medical Work up  Expected Discharge Plan and Services Expected Discharge Plan: Psychiatric Hospital   Discharge Planning Services: CM Consult   Living arrangements for the past 2 months: Homeless                                       Social Determinants of Health (SDOH) Interventions    Readmission Risk Interventions No flowsheet data found.  Quintella Baton, RN, BSN  Trauma/Neuro ICU Case Manager [phone]

## 2020-12-08 LAB — CREATININE, SERUM
Creatinine, Ser: 1.14 mg/dL (ref 0.61–1.24)
GFR, Estimated: >60 mL/min (ref >60)

## 2020-12-08 MED ORDER — POLYETHYLENE GLYCOL 3350 17 G PO PACK
17.0000 g | PACK | Freq: Every day | ORAL | Status: DC
  Administered 2020-12-08 – 2021-01-18 (×23): 17 g via ORAL
  Filled 2020-12-08 (×37): qty 1

## 2020-12-08 MED ORDER — HYDROMORPHONE HCL 1 MG/ML IJ SOLN
0.5000 mg | INTRAMUSCULAR | Status: DC | PRN
Start: 2020-12-08 — End: 2020-12-09

## 2020-12-08 MED ORDER — LACTULOSE 10 GM/15ML PO SOLN
20.0000 g | Freq: Once | ORAL | Status: AC
  Administered 2020-12-08: 20 g via ORAL
  Filled 2020-12-08: qty 30

## 2020-12-08 MED ORDER — BOOST / RESOURCE BREEZE PO LIQD CUSTOM
1.0000 | Freq: Three times a day (TID) | ORAL | Status: DC
  Administered 2020-12-09 – 2021-01-18 (×58): 1 via ORAL

## 2020-12-08 MED ORDER — METHOCARBAMOL 500 MG PO TABS
1000.0000 mg | ORAL_TABLET | Freq: Three times a day (TID) | ORAL | Status: DC
  Administered 2020-12-08 – 2020-12-14 (×19): 1000 mg via ORAL
  Filled 2020-12-08 (×18): qty 2

## 2020-12-08 MED ORDER — ONDANSETRON 4 MG PO TBDP
4.0000 mg | ORAL_TABLET | Freq: Three times a day (TID) | ORAL | Status: DC
  Administered 2020-12-08 – 2020-12-17 (×24): 4 mg via ORAL
  Filled 2020-12-08 (×25): qty 1

## 2020-12-08 NOTE — Progress Notes (Signed)
   12/08/20 1457  Clinical Encounter Type  Visited With Patient  Visit Type Follow-up  Spiritual Encounters  Spiritual Needs Emotional   Chaplain followed-up with Pt. Pt had just woken up. Pt is still feeling down. Pt hopes to talk with his brother Wednesday to get his food stamp card secured. Pt states a psychiatric hospital will help his problems.  This note was prepared by Chaplain Resident, [name], MDiv. Chaplain remains available as needed through the on-call pager: [phone].

## 2020-12-08 NOTE — Plan of Care (Signed)

## 2020-12-08 NOTE — TOC Progression Note (Signed)
Transition of Care [facility]) - Progression Note    Patient Details  Name: [name] MRN: [id number] Date of Birth: [birth year]  Transition of Care [facility]) CM/SW Contact  Astrid Drafts Berna Spare, RN Phone Number: 12/08/2020, 4:24 PM  Clinical Narrative:  Note that patient is medically stable for inpatient psych facility.  Faxed referrals to geriatric psych facilities throughout [place], a total of 9 facilities.  Will follow up with psych facilities regarding potential placement.     Expected Discharge Plan: Psychiatric Hospital Barriers to Discharge: Continued Medical Work up  Expected Discharge Plan and Services Expected Discharge Plan: Psychiatric Hospital   Discharge Planning Services: CM Consult   Living arrangements for the past 2 months: Homeless                                       Social Determinants of Health (SDOH) Interventions    Readmission Risk Interventions No flowsheet data found.  Quintella Baton, RN, BSN  Trauma/Neuro ICU Case Manager [phone]

## 2020-12-08 NOTE — Progress Notes (Signed)
Trauma paged for rash on abdomen, no orders placed to follow.

## 2020-12-08 NOTE — Plan of Care (Signed)
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
## 2020-12-09 LAB — COMPREHENSIVE METABOLIC PANEL
ALT: 19 U/L (ref 0–44)
AST: 23 U/L (ref 15–41)
Albumin: 2.8 g/dL — ABNORMAL LOW (ref 3.5–5.0)
Alkaline Phosphatase: 52 U/L (ref 38–126)
Anion gap: 12 (ref 5–15)
BUN: 5 mg/dL — ABNORMAL LOW (ref 8–23)
CO2: 20 mmol/L — ABNORMAL LOW (ref 22–32)
Calcium: 8.8 mg/dL — ABNORMAL LOW (ref 8.9–10.3)
Chloride: 101 mmol/L (ref 98–111)
Creatinine, Ser: 0.97 mg/dL (ref 0.61–1.24)
GFR, Estimated: >60 mL/min (ref >60)
Glucose, Bld: 119 mg/dL — ABNORMAL HIGH (ref 70–99)
Potassium: 3.9 mmol/L (ref 3.5–5.1)
Sodium: 133 mmol/L — ABNORMAL LOW (ref 135–145)
Total Bilirubin: 1.2 mg/dL (ref 0.3–1.2)
Total Protein: 6.9 g/dL (ref 6.5–8.1)

## 2020-12-09 LAB — CBC
HCT: 35.3 % — ABNORMAL LOW (ref 39.0–52.0)
Hemoglobin: 11.5 g/dL — ABNORMAL LOW (ref 13.0–17.0)
MCH: 26.4 pg (ref 26.0–34.0)
MCHC: 32.6 g/dL (ref 30.0–36.0)
MCV: 81 fL (ref 80.0–100.0)
Platelets: 160 10*3/uL (ref 150–400)
RBC: 4.36 MIL/uL (ref 4.22–5.81)
RDW: 17.8 % — ABNORMAL HIGH (ref 11.5–15.5)
WBC: 10.4 10*3/uL (ref 4.0–10.5)
nRBC: 0 % (ref 0.0–0.2)

## 2020-12-09 LAB — AMMONIA: Ammonia: 15 umol/L (ref 9–35)

## 2020-12-09 MED ORDER — AMITRIPTYLINE HCL 50 MG PO TABS
75.0000 mg | ORAL_TABLET | Freq: Every day | ORAL | Status: DC
  Administered 2020-12-09 – 2020-12-31 (×23): 75 mg via ORAL
  Filled 2020-12-09 (×23): qty 1

## 2020-12-09 MED ORDER — THIAMINE HCL 100 MG PO TABS
100.0000 mg | ORAL_TABLET | Freq: Every day | ORAL | Status: DC
  Administered 2020-12-10 – 2021-01-18 (×40): 100 mg via ORAL
  Filled 2020-12-09 (×42): qty 1

## 2020-12-09 MED ORDER — FOLIC ACID 1 MG PO TABS
1.0000 mg | ORAL_TABLET | Freq: Every day | ORAL | Status: DC
  Administered 2020-12-09 – 2021-01-18 (×41): 1 mg via ORAL
  Filled 2020-12-09 (×42): qty 1

## 2020-12-09 MED ORDER — HYDROMORPHONE HCL 1 MG/ML IJ SOLN
0.5000 mg | Freq: Four times a day (QID) | INTRAMUSCULAR | Status: DC | PRN
  Administered 2020-12-11 – 2020-12-12 (×2): 0.5 mg via INTRAVENOUS
  Filled 2020-12-09 (×3): qty 1

## 2020-12-09 MED ORDER — OXYCODONE HCL 5 MG PO TABS
5.0000 mg | ORAL_TABLET | ORAL | Status: DC | PRN
  Administered 2020-12-09 – 2020-12-14 (×20): 5 mg via ORAL
  Filled 2020-12-09 (×20): qty 1

## 2020-12-09 MED ORDER — ARIPIPRAZOLE 5 MG PO TABS
7.5000 mg | ORAL_TABLET | Freq: Every day | ORAL | Status: DC
  Administered 2020-12-10 – 2020-12-14 (×5): 7.5 mg via ORAL
  Filled 2020-12-09 (×5): qty 2

## 2020-12-09 MED ORDER — ADULT MULTIVITAMIN W/MINERALS CH
1.0000 | ORAL_TABLET | Freq: Every day | ORAL | Status: DC
  Administered 2020-12-09 – 2021-01-18 (×41): 1 via ORAL
  Filled 2020-12-09 (×43): qty 1

## 2020-12-09 NOTE — Plan of Care (Signed)
  Problem: Clinical Measurements: Goal: Will remain free from infection Outcome: Progressing Goal: Diagnostic test results will improve Outcome: Progressing Goal: Cardiovascular complication will be avoided Outcome: Progressing   
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
## 2020-12-09 NOTE — Progress Notes (Signed)
Pt got agitated with sitter and yelled at her to "stay out of his business" when I asked him if he had eaten lunch. He was sleeping the previous time I had checked on him. I stayed and took care of patient needs. He was resting on the bed when I left the room.
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
## 2020-12-10 ENCOUNTER — Inpatient Hospital Stay (HOSPITAL_COMMUNITY): Payer: Medicaid Other

## 2020-12-10 MED ORDER — ACETAMINOPHEN 325 MG PO TABS
650.0000 mg | ORAL_TABLET | Freq: Four times a day (QID) | ORAL | Status: DC | PRN
  Administered 2020-12-10 – 2021-01-17 (×19): 650 mg via ORAL
  Filled 2020-12-10 (×20): qty 2

## 2020-12-10 MED ORDER — LORAZEPAM 2 MG/ML IJ SOLN
1.0000 mg | INTRAMUSCULAR | Status: AC | PRN
Start: 2020-12-10 — End: 2020-12-13
  Administered 2020-12-12 – 2020-12-13 (×3): 2 mg via INTRAVENOUS
  Filled 2020-12-10 (×3): qty 1

## 2020-12-10 MED ORDER — THIAMINE HCL 100 MG/ML IJ SOLN: 100.0000 mg | Freq: Every day | INTRAMUSCULAR | Status: DC

## 2020-12-10 MED ORDER — LORAZEPAM 1 MG PO TABS
1.0000 mg | ORAL_TABLET | ORAL | Status: AC | PRN
  Administered 2020-12-12 (×2): 2 mg via ORAL
  Filled 2020-12-10 (×2): qty 2

## 2020-12-10 MED ORDER — LORAZEPAM 2 MG/ML IJ SOLN: 1.0000 mg | Freq: Two times a day (BID) | INTRAMUSCULAR | Status: DC | PRN

## 2020-12-10 MED ORDER — THIAMINE HCL 100 MG PO TABS: 100.0000 mg | ORAL_TABLET | Freq: Every day | ORAL | Status: DC

## 2020-12-10 MED ORDER — IOHEXOL 9 MG/ML PO SOLN
ORAL | Status: AC
  Administered 2020-12-10: 500 mL
  Filled 2020-12-10: qty 1000

## 2020-12-10 MED ORDER — HALOPERIDOL LACTATE 5 MG/ML IJ SOLN
5.0000 mg | Freq: Once | INTRAMUSCULAR | Status: AC
Start: 2020-12-10 — End: 2020-12-10
  Administered 2020-12-10: 5 mg via INTRAVENOUS
  Filled 2020-12-10: qty 1

## 2020-12-10 MED ORDER — PIPERACILLIN-TAZOBACTAM 3.375 G IVPB
3.3750 g | Freq: Three times a day (TID) | INTRAVENOUS | Status: AC
  Administered 2020-12-10 – 2020-12-17 (×22): 3.375 g via INTRAVENOUS
  Filled 2020-12-10 (×22): qty 50

## 2020-12-10 MED ORDER — FOLIC ACID 1 MG PO TABS: 1.0000 mg | ORAL_TABLET | Freq: Every day | ORAL | Status: DC

## 2020-12-10 MED ORDER — IOHEXOL 300 MG/ML  SOLN
100.0000 mL | Freq: Once | INTRAMUSCULAR | Status: AC | PRN
  Administered 2020-12-10: 100 mL via INTRAVENOUS

## 2020-12-10 MED ORDER — PIPERACILLIN-TAZOBACTAM 3.375 G IVPB 30 MIN: 3.3750 g | Freq: Three times a day (TID) | INTRAVENOUS | Status: DC

## 2020-12-10 NOTE — Progress Notes (Signed)
Called Officer Samuel Bouche (pt Chartered certified accountant) [phone]. Pt gave permission for me to speak to her about his hospital stay so she wouldn't issue a warrant for his arrest for not checking in.  I gave her pt room phone number so she can call and check in on him whenever she would like.  She would like notified as soon as there are plans for discharge, with date/time of discharge and where he will be going.  She would also like to know if there are any MAJOR changes in his condition to where he would not be able to communicate with her on his room phone.

## 2020-12-10 NOTE — Progress Notes (Signed)
Pt became more disoriented as the day went on. Each time he'd wake up from a nap, he needed to be reoriented.

## 2020-12-10 NOTE — Progress Notes (Signed)
Patient very anxious and irritable and stated " I would like something for his nerves".  PRN pain and sleep med given with little effect. [name] on call paged with new order.
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
## 2020-12-11 LAB — CBC
HCT: 30.6 % — ABNORMAL LOW (ref 39.0–52.0)
Hemoglobin: 10 g/dL — ABNORMAL LOW (ref 13.0–17.0)
MCH: 26.2 pg (ref 26.0–34.0)
MCHC: 32.7 g/dL (ref 30.0–36.0)
MCV: 80.3 fL (ref 80.0–100.0)
Platelets: 152 10*3/uL (ref 150–400)
RBC: 3.81 MIL/uL — ABNORMAL LOW (ref 4.22–5.81)
RDW: 17.5 % — ABNORMAL HIGH (ref 11.5–15.5)
WBC: 8.8 10*3/uL (ref 4.0–10.5)
nRBC: 0 % (ref 0.0–0.2)

## 2020-12-11 LAB — BASIC METABOLIC PANEL
Anion gap: 8 (ref 5–15)
BUN: 5 mg/dL — ABNORMAL LOW (ref 8–23)
CO2: 25 mmol/L (ref 22–32)
Calcium: 8.1 mg/dL — ABNORMAL LOW (ref 8.9–10.3)
Chloride: 99 mmol/L (ref 98–111)
Creatinine, Ser: 1.12 mg/dL (ref 0.61–1.24)
GFR, Estimated: >60 mL/min (ref >60)
Glucose, Bld: 120 mg/dL — ABNORMAL HIGH (ref 70–99)
Potassium: 3.9 mmol/L (ref 3.5–5.1)
Sodium: 132 mmol/L — ABNORMAL LOW (ref 135–145)

## 2020-12-11 LAB — AMMONIA: Ammonia: 27 umol/L (ref 9–35)

## 2020-12-11 MED ORDER — VANCOMYCIN HCL 1250 MG/250ML IV SOLN
1250.0000 mg | Freq: Two times a day (BID) | INTRAVENOUS | Status: DC
  Administered 2020-12-11 – 2020-12-13 (×4): 1250 mg via INTRAVENOUS
  Filled 2020-12-11 (×5): qty 250
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
## 2020-12-11 NOTE — Progress Notes (Signed)
Pt refused to have PIV; pt stated, "I don't need any IV... now turn that light off and get out".

## 2020-12-11 NOTE — Progress Notes (Signed)
This RN noticed pts IV dislodged in left forearm, IV team order input for difficult IV stick. Pt refuse IV insertion and morning meds. Will continue to monitor.

## 2020-12-12 LAB — COMPREHENSIVE METABOLIC PANEL
ALT: 18 U/L (ref 0–44)
AST: 28 U/L (ref 15–41)
Albumin: 2.2 g/dL — ABNORMAL LOW (ref 3.5–5.0)
Alkaline Phosphatase: 37 U/L — ABNORMAL LOW (ref 38–126)
Anion gap: 10 (ref 5–15)
BUN: 7 mg/dL — ABNORMAL LOW (ref 8–23)
CO2: 23 mmol/L (ref 22–32)
Calcium: 7.9 mg/dL — ABNORMAL LOW (ref 8.9–10.3)
Chloride: 100 mmol/L (ref 98–111)
Creatinine, Ser: 1.12 mg/dL (ref 0.61–1.24)
GFR, Estimated: >60 mL/min (ref >60)
Glucose, Bld: 129 mg/dL — ABNORMAL HIGH (ref 70–99)
Potassium: 3.5 mmol/L (ref 3.5–5.1)
Sodium: 133 mmol/L — ABNORMAL LOW (ref 135–145)
Total Bilirubin: 0.8 mg/dL (ref 0.3–1.2)
Total Protein: 6.1 g/dL — ABNORMAL LOW (ref 6.5–8.1)

## 2020-12-12 LAB — CBC
HCT: 29.3 % — ABNORMAL LOW (ref 39.0–52.0)
Hemoglobin: 9.5 g/dL — ABNORMAL LOW (ref 13.0–17.0)
MCH: 26.1 pg (ref 26.0–34.0)
MCHC: 32.4 g/dL (ref 30.0–36.0)
MCV: 80.5 fL (ref 80.0–100.0)
Platelets: 173 10*3/uL (ref 150–400)
RBC: 3.64 MIL/uL — ABNORMAL LOW (ref 4.22–5.81)
RDW: 17.3 % — ABNORMAL HIGH (ref 11.5–15.5)
WBC: 8.7 10*3/uL (ref 4.0–10.5)
nRBC: 0 % (ref 0.0–0.2)

## 2020-12-12 LAB — URINALYSIS, ROUTINE W REFLEX MICROSCOPIC
Bilirubin Urine: NEGATIVE
Glucose, UA: NEGATIVE mg/dL
Hgb urine dipstick: NEGATIVE
Ketones, ur: NEGATIVE mg/dL
Leukocytes,Ua: NEGATIVE
Nitrite: NEGATIVE
Protein, ur: NEGATIVE mg/dL
Specific Gravity, Urine: 1.025 (ref 1.005–1.030)
pH: 5 (ref 5.0–8.0)

## 2020-12-12 LAB — AMMONIA: Ammonia: 33 umol/L (ref 9–35)

## 2020-12-12 LAB — VANCOMYCIN, PEAK: Vancomycin Pk: 38 ug/mL (ref 30–40)

## 2020-12-12 NOTE — Progress Notes (Signed)
Changed dressing on dehisced wound at 1840. Moderate amount of serosanguinous drainage.  Pt more alert and oriented x3, carrying on a conversation.

## 2020-12-12 NOTE — Progress Notes (Signed)
Urine sample submitted

## 2020-12-12 NOTE — Progress Notes (Signed)
At 1240, changed out dressing on dehisced abdomen--wet to dry, 1 piece 4x4 gauze, dry 4x4 gauze (3), cloth tape to secure. ABD pad loosely taped over top to prevent drainage from getting on abdominal binder. Abdominal binder secured. Moderate drainage, serosanguinous.  Pt is resting. He is very confused, a/o only to self.  His abdomen is slightly less taut than when I was with him on Thursday, I was able to find a little bit to pinch for the lovenox injection this time.
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
## 2020-12-13 LAB — BASIC METABOLIC PANEL
Anion gap: 9 (ref 5–15)
BUN: 7 mg/dL — ABNORMAL LOW (ref 8–23)
CO2: 23 mmol/L (ref 22–32)
Calcium: 8 mg/dL — ABNORMAL LOW (ref 8.9–10.3)
Chloride: 102 mmol/L (ref 98–111)
Creatinine, Ser: 1.02 mg/dL (ref 0.61–1.24)
GFR, Estimated: >60 mL/min (ref >60)
Glucose, Bld: 105 mg/dL — ABNORMAL HIGH (ref 70–99)
Potassium: 3.5 mmol/L (ref 3.5–5.1)
Sodium: 134 mmol/L — ABNORMAL LOW (ref 135–145)

## 2020-12-13 LAB — CBC
HCT: 29.8 % — ABNORMAL LOW (ref 39.0–52.0)
Hemoglobin: 9.5 g/dL — ABNORMAL LOW (ref 13.0–17.0)
MCH: 26 pg (ref 26.0–34.0)
MCHC: 31.9 g/dL (ref 30.0–36.0)
MCV: 81.6 fL (ref 80.0–100.0)
Platelets: 181 10*3/uL (ref 150–400)
RBC: 3.65 MIL/uL — ABNORMAL LOW (ref 4.22–5.81)
RDW: 17.3 % — ABNORMAL HIGH (ref 11.5–15.5)
WBC: 6.9 10*3/uL (ref 4.0–10.5)
nRBC: 0 % (ref 0.0–0.2)

## 2020-12-13 LAB — VANCOMYCIN, TROUGH: Vancomycin Tr: 18 ug/mL (ref 15–20)

## 2020-12-13 MED ORDER — VANCOMYCIN HCL 1000 MG/200ML IV SOLN
1000.0000 mg | Freq: Two times a day (BID) | INTRAVENOUS | Status: AC
  Administered 2020-12-13 – 2020-12-17 (×9): 1000 mg via INTRAVENOUS
  Filled 2020-12-13 (×9): qty 200

## 2020-12-13 MED ORDER — HALOPERIDOL LACTATE 5 MG/ML IJ SOLN
5.0000 mg | Freq: Four times a day (QID) | INTRAMUSCULAR | Status: DC | PRN
  Administered 2020-12-13 – 2020-12-16 (×3): 5 mg via INTRAVENOUS
  Filled 2020-12-13 (×3): qty 1

## 2020-12-13 MED ORDER — LORAZEPAM 0.5 MG PO TABS
0.5000 mg | ORAL_TABLET | Freq: Four times a day (QID) | ORAL | Status: DC | PRN
  Administered 2020-12-13 – 2021-01-13 (×8): 0.5 mg via ORAL
  Filled 2020-12-13 (×8): qty 1

## 2020-12-13 NOTE — Progress Notes (Signed)
Patient is restless and agitated (pacing around the room, trying to pull out his IV line). Also has visual hallucinations stating " He's right there, I gotta get to him". Patient also wants to get out through the windows. Teacher, early years/pre tried to redirect/reorient patient but he is unredirectable. Paged Phylliss Blakes, MD. New order received and noted. PRN Haldol was given. Will continue to close monitor patient. 1:1 sitter at bedside.

## 2020-12-13 NOTE — Progress Notes (Signed)
Pt a/ to self today. Removed his abdominal binder twice today with sitter. He was willing to put it back on with my help. Reminded pt of need to keep it on at all times.

## 2020-12-13 NOTE — Progress Notes (Signed)
Changed dressing and reapplied abd binder at shift change. Minimal serosanguinous drainage. All internal stitches intact
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
## 2020-12-13 NOTE — Progress Notes (Signed)
Progress Note  12 Days Post-Op  Subjective: Pt a little agitated, using explicit language with me this morning but I was able to calm him down and re-direct him by talking to him. Slightly less confused this AM- oriented to person, states year 2017, states he is bed in [facility]. Denies new pain. Per RN NAEO.   Objective: Vital signs in last 24 hours: Temp:  [98.2 F (36.8 C)-99.4 F (37.4 C)] 98.2 F (36.8 C) (02/26 2316) Pulse Rate:  [66-67] 67 (02/26 2316) Resp:  [16-18] 18 (02/26 2316) BP: (133-146)/(63-77) 141/77 (02/26 2316) SpO2:  [93 %-98 %] 97 % (02/26 2316) Last BM Date: 12/10/20  Intake/Output from previous day: 02/26 0701 - 02/27 0700 In: 780 [P.O.:480; IV Piggyback:300] Out: 1450 [Urine:1450] Intake/Output this shift: No intake/output data recorded.  PE: Gen: Alert, NAD, pleasant Card: RRR Pulm: CTA b/l, on RA, normal rate and effort Abd: Soft,mild distension, appropriately tender around midline wound,+BS,midline wound with running sutures in place, there is wound dehiscence in the central and inferior aspect of the incision with visible running sutures that close the peritoneum. peritoneal sutures are in tact. surrounding cellulitis/blanching erythema still present - worse in inferior and left hemiabdomen.  Ext: No LE edema Psych: oriented to self only Skin: no rashes noted, warm and dry   Lab Results:  Recent Labs    12/12/20 0943 12/13/20 0221  WBC 8.7 6.9  HGB 9.5* 9.5*  HCT 29.3* 29.8*  PLT 173 181   BMET Recent Labs    12/12/20 0943 12/13/20 0221  NA 133* 134*  K 3.5 3.5  CL 100 102  CO2 23 23  GLUCOSE 129* 105*  BUN 7* 7*  CREATININE 1.12 1.02  CALCIUM 7.9* 8.0*   PT/INR No results for input(s): LABPROT, INR in the last 72 hours. CMP     Component Value Date/Time   NA 134 (L) 12/13/2020 0221   K 3.5 12/13/2020 0221   CL 102 12/13/2020 0221   CO2 23 12/13/2020 0221   GLUCOSE 105 (H) 12/13/2020 0221   BUN 7 (L)  12/13/2020 0221   CREATININE 1.02 12/13/2020 0221   CALCIUM 8.0 (L) 12/13/2020 0221   PROT 6.1 (L) 12/12/2020 0943   ALBUMIN 2.2 (L) 12/12/2020 0943   AST 28 12/12/2020 0943   ALT 18 12/12/2020 0943   ALKPHOS 37 (L) 12/12/2020 0943   BILITOT 0.8 12/12/2020 0943   GFRNONAA >60 12/13/2020 0221   Lipase  No results found for: LIPASE     Studies/Results: No results found.  Anti-infectives: Anti-infectives (From admission, onward)   Start     Dose/Rate Route Frequency Ordered Stop   12/13/20 1800  vancomycin (VANCOREADY) IVPB 1000 mg/200 mL        1,000 mg 200 mL/hr over 60 Minutes Intravenous Every 12 hours 12/13/20 0733     12/11/20 1300  vancomycin (VANCOREADY) IVPB 1250 mg/250 mL  Status:  Discontinued        1,250 mg 166.7 mL/hr over 90 Minutes Intravenous Every 12 hours 12/11/20 1230 12/13/20 0733   12/10/20 0900  piperacillin-tazobactam (ZOSYN) IVPB 3.375 g  Status:  Discontinued        3.375 g 100 mL/hr over 30 Minutes Intravenous Every 8 hours 12/10/20 0808 12/10/20 0811   12/10/20 0900  piperacillin-tazobactam (ZOSYN) IVPB 3.375 g        3.375 g 12.5 mL/hr over 240 Minutes Intravenous Every 8 hours 12/10/20 0811     12/01/20 1900  ceFAZolin (ANCEF) 3  g in dextrose 5 % 50 mL IVPB        3 g 100 mL/hr over 30 Minutes Intravenous  Once 12/01/20 1859 12/01/20 1935   12/01/20 1900  metroNIDAZOLE (FLAGYL) IVPB 500 mg        500 mg 100 mL/hr over 60 Minutes Intravenous  Once 12/01/20 1859 12/01/20 2008       Assessment/Plan 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 Dossie Der - POD#11 -soft diet - CT A/P with no acute findings, but the patient has abdominal wall cellulitis around his wound and extending out.  He has been started on zosyn 2/24, vanc added 2/25. No elevation of WBC, spiked fevers 2/24 but these have resolved now. (last fever 2/25 PM 100.4  -continue to mobilize -  pulm toilet  - JP drain removed on POD 9  ABL anemia- hgb11.5 and stable Hx of EtOH abuse- CIWA Hx of Hep Cs/p treatment Liver Cirrhosis-ammonia 23 yesterday,lactulose once daily or q12 prn. Unclear what dose he was on prior to admission.Consider GI outpt f/u HTN- prn meds Hx ofbipolar and schizophreniaw/ multiple previous suicide attempts- psychstarted on Elavil and Abilify. EKG2/16NSR w/ QTc 415.They are recommending inpatient admission. 1:1 sitter.  QBH:ALPF diet, bowel regimen VTE: lovenox ID: flagyl/ancef 2/15. Zosyn 2/24>>  Vanc 2/25>> Foley:None currently.Voiding  Dispo: abdominal binder AT ALL TIMES. UA negative. AM labs r LOS: 12 days    Adam Phenix , [facility] 12/13/2020, 9:46 AM Please see Amion for pager number during day hours 7:00am-4:30pm

## 2020-12-13 NOTE — TOC Progression Note (Signed)
Transition of Care [facility]) - Progression Note    Patient Details  Name: [name] MRN: [id number] Date of Birth: 06-Dec-1955  Transition of Care [facility]) CM/SW Contact  Patrice [place], [place] Phone Number: [phone] 12/13/2020, 2:03 PM  Clinical Narrative:    CSW followed up on several of the referrals that had been sent out earlier this week.  [facility]- they stated that they did not receive the referral therefore CSW re-faxed. They confirmed they received it however they do not accept patient with [facility].  [facility]- Received a voice mail  [facility] - Spoke with Selena Batten and she let CSW know that their geriatric unit opened at half capacity and they do not have any beds available.  [facility]- No beds available  [facility] team will continue to assist with discharge planning needs.   Expected Discharge Plan: Psychiatric Hospital Barriers to Discharge: Continued Medical Work up  Expected Discharge Plan and Services Expected Discharge Plan: Psychiatric Hospital   Discharge Planning Services: CM Consult   Living arrangements for the past 2 months: Homeless                                       Social Determinants of Health (SDOH) Interventions    Readmission Risk Interventions No flowsheet data found.

## 2020-12-13 NOTE — Progress Notes (Signed)
Changed abdominal dressing. Midline dehisced section still has stitches visible and intact inside dehisced section. 3 stitches below dehisced section are spreading apart and leaking light brown, serosanguinous fluid. 3-4 stitches above dehisced section are oozing brownish, serosanguinous liquid. Minimal drainage.

## 2020-12-14 LAB — CBC
HCT: 27.7 % — ABNORMAL LOW (ref 39.0–52.0)
Hemoglobin: 8.9 g/dL — ABNORMAL LOW (ref 13.0–17.0)
MCH: 26 pg (ref 26.0–34.0)
MCHC: 32.1 g/dL (ref 30.0–36.0)
MCV: 81 fL (ref 80.0–100.0)
Platelets: 208 10*3/uL (ref 150–400)
RBC: 3.42 MIL/uL — ABNORMAL LOW (ref 4.22–5.81)
RDW: 17.6 % — ABNORMAL HIGH (ref 11.5–15.5)
WBC: 5.5 10*3/uL (ref 4.0–10.5)
nRBC: 0 % (ref 0.0–0.2)

## 2020-12-14 LAB — COMPREHENSIVE METABOLIC PANEL
ALT: 19 U/L (ref 0–44)
AST: 25 U/L (ref 15–41)
Albumin: 2.1 g/dL — ABNORMAL LOW (ref 3.5–5.0)
Alkaline Phosphatase: 40 U/L (ref 38–126)
Anion gap: 9 (ref 5–15)
BUN: 6 mg/dL — ABNORMAL LOW (ref 8–23)
CO2: 24 mmol/L (ref 22–32)
Calcium: 8.2 mg/dL — ABNORMAL LOW (ref 8.9–10.3)
Chloride: 105 mmol/L (ref 98–111)
Creatinine, Ser: 1 mg/dL (ref 0.61–1.24)
GFR, Estimated: >60 mL/min (ref >60)
Glucose, Bld: 87 mg/dL (ref 70–99)
Potassium: 3.7 mmol/L (ref 3.5–5.1)
Sodium: 138 mmol/L (ref 135–145)
Total Bilirubin: 0.7 mg/dL (ref 0.3–1.2)
Total Protein: 6.7 g/dL (ref 6.5–8.1)

## 2020-12-14 LAB — AMMONIA: Ammonia: 36 umol/L — ABNORMAL HIGH (ref 9–35)

## 2020-12-14 MED ORDER — ARIPIPRAZOLE 10 MG PO TABS
10.0000 mg | ORAL_TABLET | Freq: Every day | ORAL | Status: DC
  Administered 2020-12-15 – 2020-12-29 (×15): 10 mg via ORAL
  Filled 2020-12-14 (×16): qty 1

## 2020-12-14 MED ORDER — HALOPERIDOL 1 MG PO TABS
2.0000 mg | ORAL_TABLET | Freq: Two times a day (BID) | ORAL | Status: DC
  Administered 2020-12-14 – 2020-12-29 (×31): 2 mg via ORAL
  Filled 2020-12-14 (×32): qty 2

## 2020-12-14 MED ORDER — METHOCARBAMOL 500 MG PO TABS
1000.0000 mg | ORAL_TABLET | Freq: Three times a day (TID) | ORAL | Status: DC | PRN
  Administered 2020-12-14 – 2021-01-17 (×30): 1000 mg via ORAL
  Filled 2020-12-14 (×30): qty 2

## 2020-12-14 MED ORDER — OXYCODONE HCL 5 MG PO TABS
5.0000 mg | ORAL_TABLET | Freq: Four times a day (QID) | ORAL | Status: DC | PRN
  Administered 2020-12-14 – 2020-12-18 (×11): 5 mg via ORAL
  Filled 2020-12-14 (×11): qty 1

## 2020-12-14 MED ORDER — PALIPERIDONE ER 3 MG PO TB24: 3.0000 mg | ORAL_TABLET | Freq: Every day | ORAL | Status: DC

## 2020-12-14 MED ORDER — HYDROMORPHONE HCL 1 MG/ML IJ SOLN
0.5000 mg | Freq: Two times a day (BID) | INTRAMUSCULAR | Status: DC | PRN
  Administered 2020-12-15: 0.5 mg via INTRAVENOUS
  Filled 2020-12-14 (×2): qty 1

## 2020-12-14 NOTE — Progress Notes (Signed)
Progress Note  13 Days Post-Op  Subjective: Up in the bathroom. Visual hallucinations (seeing a man who is not there) reported by patient and nurse. +BM yesterday. Oriented to self only.  Objective: Vital signs in last 24 hours: Temp:  [98.2 F (36.8 C)-98.4 F (36.9 C)] 98.2 F (36.8 C) (02/28 0400) Pulse Rate:  [77-78] 77 (02/28 0400) Resp:  [18] 18 (02/28 0400) BP: (119-122)/(82-85) 119/83 (02/28 0400) SpO2:  [96 %-97 %] 97 % (02/28 0400) Last BM Date: 12/12/20  Intake/Output from previous day: 02/27 0701 - 02/28 0700 In: 1440 [P.O.:1140; IV Piggyback:300] Out: -  Intake/Output this shift: No intake/output data recorded.  PE: Gen: Alert, NAD, pleasant Card: RRR Pulm: CTA b/l, on RA, normal rate and effort Abd: Soft,mild distension, appropriately tender around midline wound,+BS,midline wound with running sutures in place, there is wound dehiscence in the central and inferior aspect of the incision with visible running sutures that close the peritoneum. peritoneal sutures are in tact.  There is a vera small amt SS and clear tan drainage from between sutures. surrounding cellulitis/blanching erythema still present but improved. Ext: No LE edema Psych: oriented to self only - year 2019, place: prison/ Skin: no rashes noted, warm and dry   Lab Results:  Recent Labs    12/13/20 0221 12/14/20 0647  WBC 6.9 5.5  HGB 9.5* 8.9*  HCT 29.8* 27.7*  PLT 181 208   BMET Recent Labs    12/12/20 0943 12/13/20 0221  NA 133* 134*  K 3.5 3.5  CL 100 102  CO2 23 23  GLUCOSE 129* 105*  BUN 7* 7*  CREATININE 1.12 1.02  CALCIUM 7.9* 8.0*   PT/INR No results for input(s): LABPROT, INR in the last 72 hours. CMP     Component Value Date/Time   NA 134 (L) 12/13/2020 0221   K 3.5 12/13/2020 0221   CL 102 12/13/2020 0221   CO2 23 12/13/2020 0221   GLUCOSE 105 (H) 12/13/2020 0221   BUN 7 (L) 12/13/2020 0221   CREATININE 1.02 12/13/2020 0221   CALCIUM 8.0  (L) 12/13/2020 0221   PROT 6.1 (L) 12/12/2020 0943   ALBUMIN 2.2 (L) 12/12/2020 0943   AST 28 12/12/2020 0943   ALT 18 12/12/2020 0943   ALKPHOS 37 (L) 12/12/2020 0943   BILITOT 0.8 12/12/2020 0943   GFRNONAA >60 12/13/2020 0221   Lipase  No results found for: LIPASE     Studies/Results: No results found.  Anti-infectives: Anti-infectives (From admission, onward)   Start     Dose/Rate Route Frequency Ordered Stop   12/13/20 1800  vancomycin (VANCOREADY) IVPB 1000 mg/200 mL        1,000 mg 200 mL/hr over 60 Minutes Intravenous Every 12 hours 12/13/20 0733     12/11/20 1300  vancomycin (VANCOREADY) IVPB 1250 mg/250 mL  Status:  Discontinued        1,250 mg 166.7 mL/hr over 90 Minutes Intravenous Every 12 hours 12/11/20 1230 12/13/20 0733   12/10/20 0900  piperacillin-tazobactam (ZOSYN) IVPB 3.375 g  Status:  Discontinued        3.375 g 100 mL/hr over 30 Minutes Intravenous Every 8 hours 12/10/20 0808 12/10/20 0811   12/10/20 0900  piperacillin-tazobactam (ZOSYN) IVPB 3.375 g        3.375 g 12.5 mL/hr over 240 Minutes Intravenous Every 8 hours 12/10/20 0811     12/01/20 1900  ceFAZolin (ANCEF) 3 g in dextrose 5 % 50 mL IVPB  3 g 100 mL/hr over 30 Minutes Intravenous  Once 12/01/20 1859 12/01/20 1935   12/01/20 1900  metroNIDAZOLE (FLAGYL) IVPB 500 mg        500 mg 100 mL/hr over 60 Minutes Intravenous  Once 12/01/20 1859 12/01/20 2008       Assessment/Plan 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 Dossie Der - POD#13 -soft diet - CT A/P 2/24 with no acute findings, but the patient has abdominal wall cellulitis around his wound and extending out.  He has been started on zosyn 2/24, vanc added 2/25. No elevation of WBC, spiked fevers 2/24 but these have resolved now. (last fever 2/25 PM 100.4). cellulitis improving. -continue to mobilize - pulm toilet  - JP drain removed on  POD 9  ABL anemia- monitor, stable  Hx of EtOH abuse- CIWA Hx of Hep Cs/p treatment Liver Cirrhosis-ammonia 36 today,lactulose once daily or q12 prn. Unclear what dose he was on prior to admission.Consider GI outpt f/u HTN- prn meds Hx ofbipolar and schizophreniaw/ multiple previous suicide attempts- psychstarted on Elavil and Abilify. EKG2/16NSR w/ QTc 415.They are recommending inpatient admission. 1:1 sitter.re-consult psych given worsening agitation and visual hallucinations.  MVH:QION diet, bowel regimen VTE: lovenox ID: flagyl/ancef 2/15. Zosyn 2/24>>  Vanc 2/25>> Foley:None currently.Voiding  Dispo: abdominal binder AT ALL TIMES. IV abx. Psych consult for agitation and hallucinations. Has been getting daily lactualose- give an additional dose of PRN lactulose today for ammonia 36.  r LOS: 13 days    Adam Phenix , [facility] 12/14/2020, 8:11 AM Please see Amion for pager number during day hours 7:00am-4:30pm
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
## 2020-12-15 LAB — CBC
HCT: 31.4 % — ABNORMAL LOW (ref 39.0–52.0)
Hemoglobin: 9.7 g/dL — ABNORMAL LOW (ref 13.0–17.0)
MCH: 25.2 pg — ABNORMAL LOW (ref 26.0–34.0)
MCHC: 30.9 g/dL (ref 30.0–36.0)
MCV: 81.6 fL (ref 80.0–100.0)
Platelets: 233 10*3/uL (ref 150–400)
RBC: 3.85 MIL/uL — ABNORMAL LOW (ref 4.22–5.81)
RDW: 17.3 % — ABNORMAL HIGH (ref 11.5–15.5)
WBC: 5.8 10*3/uL (ref 4.0–10.5)
nRBC: 0 % (ref 0.0–0.2)

## 2020-12-15 LAB — MAGNESIUM: Magnesium: 2 mg/dL (ref 1.7–2.4)

## 2020-12-15 LAB — BASIC METABOLIC PANEL
Anion gap: 9 (ref 5–15)
BUN: 7 mg/dL — ABNORMAL LOW (ref 8–23)
CO2: 24 mmol/L (ref 22–32)
Calcium: 8.4 mg/dL — ABNORMAL LOW (ref 8.9–10.3)
Chloride: 105 mmol/L (ref 98–111)
Creatinine, Ser: 1.08 mg/dL (ref 0.61–1.24)
GFR, Estimated: >60 mL/min (ref >60)
Glucose, Bld: 101 mg/dL — ABNORMAL HIGH (ref 70–99)
Potassium: 3.8 mmol/L (ref 3.5–5.1)
Sodium: 138 mmol/L (ref 135–145)

## 2020-12-15 NOTE — TOC Progression Note (Signed)
Transition of Care [facility]) - Progression Note    Patient Details  Name: [name] MRN: [id number] Date of Birth: 09-16-1956  Transition of [facility]) CM/SW Contact  Astrid Drafts Berna Spare, RN Phone Number: 12/15/2020, 4:43 PM  Clinical Narrative:  Referral faxed to [facility] Intake for [facility].  (fax: [phone]). Verbal interview completed.      Expected Discharge Plan: Psychiatric Hospital Barriers to Discharge: Continued Medical Work up  Expected Discharge Plan and Services Expected Discharge Plan: Psychiatric Hospital   Discharge Planning Services: CM Consult   Living arrangements for the past 2 months: Homeless                                       Social Determinants of Health (SDOH) Interventions    Readmission Risk Interventions No flowsheet data found. Quintella Baton, RN, BSN  Trauma/Neuro ICU Case Manager [phone]
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
## 2020-12-16 LAB — AMMONIA: Ammonia: 40 umol/L — ABNORMAL HIGH (ref 9–35)

## 2020-12-16 MED ORDER — HYDROCORTISONE 1 % EX CREA
TOPICAL_CREAM | Freq: Three times a day (TID) | CUTANEOUS | Status: DC | PRN
  Filled 2020-12-16: qty 28.35
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
## 2020-12-16 NOTE — Plan of Care (Signed)
  Problem: Education: Goal: Knowledge of General Education information will improve Description: Including pain rating scale, medication(s)/side effects and non-pharmacologic comfort measures Outcome: Progressing   Problem: Activity: Goal: Risk for activity intolerance will decrease Outcome: Progressing   Problem: Nutrition: Goal: Adequate nutrition will be maintained Outcome: Progressing   

## 2020-12-16 NOTE — Plan of Care (Signed)

## 2020-12-17 MED ORDER — ONDANSETRON 4 MG PO TBDP
4.0000 mg | ORAL_TABLET | Freq: Three times a day (TID) | ORAL | Status: DC | PRN
  Administered 2020-12-17 – 2020-12-19 (×2): 4 mg via ORAL
  Filled 2020-12-17 (×2): qty 1

## 2020-12-17 NOTE — Plan of Care (Signed)

## 2020-12-17 NOTE — TOC Progression Note (Signed)
Transition of Care [facility]) - Progression Note    Patient Details  Name: [name] MRN: [id number] Date of Birth: 01/20/1956  Transition of Care [facility]) CM/SW Contact  Astrid Drafts Berna Spare, RN Phone Number: 12/17/2020, 12:45 PM  Clinical Narrative:   Pt remains on [facility] waiting list for Gerropsych; confirmed with [facility] intake that patient is still on list.     Expected Discharge Plan: [facility] Barriers to Discharge: Continued Medical Work up  Expected Discharge Plan and Services Expected Discharge Plan: Psychiatric Hospital   Discharge Planning Services: CM Consult   Living arrangements for the past 2 months: Homeless                                       Social Determinants of Health (SDOH) Interventions    Readmission Risk Interventions No flowsheet data found.  Quintella Baton, RN, BSN  Trauma/Neuro ICU Case Manager [phone]

## 2020-12-17 NOTE — Progress Notes (Signed)
Progress Note  16 Days Post-Op  Subjective: No new issues.  Oriented completely this morning.  Continues to complain of chronic abdominal pain that has been there for at least the last 6 years.  Questioning how we can make that pain go away and if removing part of his colon will help that etc.     Objective: Vital signs in last 24 hours: Temp:  [97.5 F (36.4 C)-98.2 F (36.8 C)] 97.5 F (36.4 C) (03/03 0546) Pulse Rate:  [55-65] 55 (03/03 0546) Resp:  [15-18] 15 (03/03 0546) BP: (130-135)/(57-76) 130/60 (03/03 0546) SpO2:  [97 %-100 %] 100 % (03/03 0546) Last BM Date: 12/16/20  Intake/Output from previous day: 03/02 0701 - 03/03 0700 In: 1049.1 [P.O.:830; IV Piggyback:219.1] Out: -  Intake/Output this shift: Total I/O In: 240 [P.O.:240] Out: -   PE: Gen: Alert, NAD, pleasant Card: RRR Pulm: CTA b/l, on RA, normal rate and effort Abd: Soft,mild distension, non-tender,+BS,midline wound with running sutures in place, there is wound dehiscence in the central and inferior aspect of the incision with visible running sutures that close the peritoneum. peritoneal sutures are in tact.  There is a very small amt of seropurulent drainage from between sutures in the upper portion of his incision currently.  This sutures are still intact but the skin is becoming more macerated and may certainly begin to pull apart as the inferior aspect has.  Still has some surrounding erythema on the right side of the wound.  The diffuse cellulitis is improved.   Ext: No LE edema Psych: A&Ox3 Skin: no rashes noted, warm and dry   Lab Results:  Recent Labs    12/15/20 0100  WBC 5.8  HGB 9.7*  HCT 31.4*  PLT 233   BMET Recent Labs    12/15/20 0100  NA 138  K 3.8  CL 105  CO2 24  GLUCOSE 101*  BUN 7*  CREATININE 1.08  CALCIUM 8.4*   PT/INR No results for input(s): LABPROT, INR in the last 72 hours. CMP     Component Value Date/Time   NA 138 12/15/2020 0100   K 3.8  12/15/2020 0100   CL 105 12/15/2020 0100   CO2 24 12/15/2020 0100   GLUCOSE 101 (H) 12/15/2020 0100   BUN 7 (L) 12/15/2020 0100   CREATININE 1.08 12/15/2020 0100   CALCIUM 8.4 (L) 12/15/2020 0100   PROT 6.7 12/14/2020 0647   ALBUMIN 2.1 (L) 12/14/2020 0647   AST 25 12/14/2020 0647   ALT 19 12/14/2020 0647   ALKPHOS 40 12/14/2020 0647   BILITOT 0.7 12/14/2020 0647   GFRNONAA >60 12/15/2020 0100   Lipase  No results found for: LIPASE     Studies/Results: No results found.  Anti-infectives: Anti-infectives (From admission, onward)   Start     Dose/Rate Route Frequency Ordered Stop   12/13/20 1800  vancomycin (VANCOREADY) IVPB 1000 mg/200 mL        1,000 mg 200 mL/hr over 60 Minutes Intravenous Every 12 hours 12/13/20 0733 12/17/20 2359   12/11/20 1300  vancomycin (VANCOREADY) IVPB 1250 mg/250 mL  Status:  Discontinued        1,250 mg 166.7 mL/hr over 90 Minutes Intravenous Every 12 hours 12/11/20 1230 12/13/20 0733   12/10/20 0900  piperacillin-tazobactam (ZOSYN) IVPB 3.375 g  Status:  Discontinued        3.375 g 100 mL/hr over 30 Minutes Intravenous Every 8 hours 12/10/20 0808 12/10/20 0811   12/10/20 0900  piperacillin-tazobactam (ZOSYN) IVPB  3.375 g        3.375 g 12.5 mL/hr over 240 Minutes Intravenous Every 8 hours 12/10/20 0811 12/17/20 2359   12/01/20 1900  ceFAZolin (ANCEF) 3 g in dextrose 5 % 50 mL IVPB        3 g 100 mL/hr over 30 Minutes Intravenous  Once 12/01/20 1859 12/01/20 1935   12/01/20 1900  metroNIDAZOLE (FLAGYL) IVPB 500 mg        500 mg 100 mL/hr over 60 Minutes Intravenous  Once 12/01/20 1859 12/01/20 2008       Assessment/Plan 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 Dossie Der - POD#16 -soft diet - CT A/P 2/24 with no acute findings, but the patient has abdominal wall cellulitis around his wound and extending out.  He has been started on zosyn 2/24,  vanc added 2/25. No elevation of WBC, spiked fevers 2/24 but these have resolved now. (last fever 2/25 PM 100.4). cellulitis improving.  Will treat with 7 days of zosyn/vanc then stop, 3/3 -continue to mobilize - pulm toilet  - JP drain removed on POD 9  ABL anemia- monitor, stable  Hx of EtOH abuse- CIWA Hx of Hep Cs/p treatment Liver Cirrhosis-ammonia 40 yesterday,lactulose once daily or q12 prn. Recheck in AM HTN- prn meds Hx ofbipolar and schizophreniaw/ multiple previous suicide attempts- psychstarted on Elavil and Abilify. EKG2/16NSR w/ QTc 415.They are recommending inpatient admission. 1:1 sitter.psych re-consulted 2/28 and they increased abilify and started PO Haldol BID. delirium vs schizophrenia, favor delirium.   TIR:WERX diet, bowel regimen VTE: lovenox ID: flagyl/ancef 2/15. Zosyn 2/24>>  Vanc 2/25>> Foley:None currently.Voiding  Dispo: abdominal binder AT ALL TIMES. Moist to dry dressing over abd wound.  Await inpatient psych placement.   LOS: 16 days    Letha Cape , [facility] 12/17/2020, 8:34 AM Please see Amion for pager number during day hours 7:00am-4:30pm

## 2020-12-18 LAB — AMMONIA: Ammonia: 39 umol/L — ABNORMAL HIGH (ref 9–35)

## 2020-12-18 LAB — CBC
HCT: 29.8 % — ABNORMAL LOW (ref 39.0–52.0)
Hemoglobin: 9.6 g/dL — ABNORMAL LOW (ref 13.0–17.0)
MCH: 25.9 pg — ABNORMAL LOW (ref 26.0–34.0)
MCHC: 32.2 g/dL (ref 30.0–36.0)
MCV: 80.3 fL (ref 80.0–100.0)
Platelets: 259 10*3/uL (ref 150–400)
RBC: 3.71 MIL/uL — ABNORMAL LOW (ref 4.22–5.81)
RDW: 17.4 % — ABNORMAL HIGH (ref 11.5–15.5)
WBC: 5.8 10*3/uL (ref 4.0–10.5)
nRBC: 0 % (ref 0.0–0.2)

## 2020-12-18 MED ORDER — OXYCODONE HCL 5 MG PO TABS
5.0000 mg | ORAL_TABLET | Freq: Four times a day (QID) | ORAL | Status: DC | PRN
  Administered 2020-12-18: 10 mg via ORAL
  Administered 2020-12-18: 5 mg via ORAL
  Administered 2020-12-19 (×4): 10 mg via ORAL
  Administered 2020-12-20: 5 mg via ORAL
  Administered 2020-12-20 – 2020-12-24 (×14): 10 mg via ORAL
  Administered 2020-12-24: 5 mg via ORAL
  Administered 2020-12-25 – 2020-12-28 (×9): 10 mg via ORAL
  Administered 2020-12-29: 5 mg via ORAL
  Administered 2020-12-29 – 2021-01-03 (×11): 10 mg via ORAL
  Filled 2020-12-18 (×43): qty 2

## 2020-12-18 NOTE — Progress Notes (Signed)
17 Days Post-Op  Subjective: CC: Patient tolerating his diet (pizza last night) without n/v or increased abdominal pain. BM yesterday. Voiding. No new issues.   Objective: Vital signs in last 24 hours: Temp:  [97.8 F (36.6 C)] 97.8 F (36.6 C) (03/04 0626) Pulse Rate:  [66-67] 67 (03/04 0626) Resp:  [20] 20 (03/04 0626) BP: (127-142)/(72-79) 142/79 (03/04 0626) SpO2:  [98 %-99 %] 98 % (03/04 0626) Last BM Date: 12/17/20  Intake/Output from previous day: 03/03 0701 - 03/04 0700 In: 930 [P.O.:930] Out: 3 [Stool:3] Intake/Output this shift: No intake/output data recorded.  PE: Gen: Alert, NAD, pleasant Card: RRR Pulm: CTA b/l, on RA, normal rate and effort Abd: Soft,mild distension, non-tender,+BS,midline wound with running sutures in place, there is wound dehiscence in the central and inferior aspect of the incision with visible running sutures that close the peritoneum. Peritoneal sutures are intact.  There is a very small amt of SS drainage from between 8th and 9th sutures in the upper portion of his incision currently where the wound has pulled apart some. There is granulation tissue at the base (see below). Still has some surrounding erythema on the right side of the wound but this appears stable/improved from prior pictures.  The diffuse cellulitis is improved.    Lab Results:  Recent Labs    12/18/20 0434  WBC 5.8  HGB 9.6*  HCT 29.8*  PLT 259   BMET No results for input(s): NA, K, CL, CO2, GLUCOSE, BUN, CREATININE, CALCIUM in the last 72 hours. PT/INR No results for input(s): LABPROT, INR in the last 72 hours. CMP     Component Value Date/Time   NA 138 12/15/2020 0100   K 3.8 12/15/2020 0100   CL 105 12/15/2020 0100   CO2 24 12/15/2020 0100   GLUCOSE 101 (H) 12/15/2020 0100   BUN 7 (L) 12/15/2020 0100   CREATININE 1.08 12/15/2020 0100   CALCIUM 8.4 (L) 12/15/2020 0100   PROT 6.7 12/14/2020 0647   ALBUMIN 2.1 (L) 12/14/2020 0647   AST 25  12/14/2020 0647   ALT 19 12/14/2020 0647   ALKPHOS 40 12/14/2020 0647   BILITOT 0.7 12/14/2020 0647   GFRNONAA >60 12/15/2020 0100   Lipase  No results found for: LIPASE     Studies/Results: No results found.  Anti-infectives: Anti-infectives (From admission, onward)   Start     Dose/Rate Route Frequency Ordered Stop   12/13/20 1800  vancomycin (VANCOREADY) IVPB 1000 mg/200 mL        1,000 mg 200 mL/hr over 60 Minutes Intravenous Every 12 hours 12/13/20 0733 12/17/20 1945   12/11/20 1300  vancomycin (VANCOREADY) IVPB 1250 mg/250 mL  Status:  Discontinued        1,250 mg 166.7 mL/hr over 90 Minutes Intravenous Every 12 hours 12/11/20 1230 12/13/20 0733   12/10/20 0900  piperacillin-tazobactam (ZOSYN) IVPB 3.375 g  Status:  Discontinued        3.375 g 100 mL/hr over 30 Minutes Intravenous Every 8 hours 12/10/20 0808 12/10/20 0811   12/10/20 0900  piperacillin-tazobactam (ZOSYN) IVPB 3.375 g        3.375 g 12.5 mL/hr over 240 Minutes Intravenous Every 8 hours 12/10/20 0811 12/17/20 2359   12/01/20 1900  ceFAZolin (ANCEF) 3 g in dextrose 5 % 50 mL IVPB        3 g 100 mL/hr over 30 Minutes Intravenous  Once 12/01/20 1859 12/01/20 1935   12/01/20 1900  metroNIDAZOLE (FLAGYL) IVPB 500 mg  500 mg 100 mL/hr over 60 Minutes Intravenous  Once 12/01/20 1859 12/01/20 2008       Assessment/Plan 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 Dossie Der - POD#17 -Soft diet - CT A/P 2/24 with no acute findings, but the patient had abdominal wall cellulitis around his wound and extending out.  He was started on zosyn 2/24, vanc added 2/25. No elevation of WBC, spiked fevers 2/24 but these have resolved now. (last fever 2/25 PM 100.4). Cellulitis improving. He completed 7 days of zosyn/vanc then stop, 3/3. Will repeat AM labs. Monitor fever curve and wound off abx.  -continue to mobilize - pulm  toilet  -JP drain removed on POD 9  ABL anemia- monitor, stable  Hx of EtOH abuse- CIWA Hx of Hep Cs/p treatment Liver Cirrhosis- Ammonia 39 today,lactulose once daily or q12 prn. Recheck in AM HTN- prn meds Hx ofbipolar and schizophreniaw/ multiple previous suicide attempts- psychstarted on Elavil and Abilify. EKG2/16NSR w/ QTc 415.They are recommending inpatient admission. 1:1 sitter.psych re-consulted 2/28 and they increased abilify and started PO Haldol BID. delirium vs schizophrenia, favor delirium.   KZS:WFUX diet, bowel regimen VTE: lovenox ID: flagyl/ancef 2/15. Zosyn 2/24>>  Vanc 2/25>> Foley:None currently.Voiding  Dispo: abdominal binder AT ALL TIMES. Moist to dry dressing over abd wound.  Await inpatient psych placement.   LOS: 17 days    Jacinto Halim , [facility] 12/18/2020, 10:11 AM Please see Amion for pager number during day hours 7:00am-4:30pm

## 2020-12-19 LAB — CBC
HCT: 30.3 % — ABNORMAL LOW (ref 39.0–52.0)
Hemoglobin: 9.4 g/dL — ABNORMAL LOW (ref 13.0–17.0)
MCH: 25.2 pg — ABNORMAL LOW (ref 26.0–34.0)
MCHC: 31 g/dL (ref 30.0–36.0)
MCV: 81.2 fL (ref 80.0–100.0)
Platelets: 248 10*3/uL (ref 150–400)
RBC: 3.73 MIL/uL — ABNORMAL LOW (ref 4.22–5.81)
RDW: 17.3 % — ABNORMAL HIGH (ref 11.5–15.5)
WBC: 4.7 10*3/uL (ref 4.0–10.5)
nRBC: 0 % (ref 0.0–0.2)

## 2020-12-19 LAB — AMMONIA: Ammonia: 40 umol/L — ABNORMAL HIGH (ref 9–35)

## 2020-12-19 LAB — BASIC METABOLIC PANEL
Anion gap: 8 (ref 5–15)
BUN: 8 mg/dL (ref 8–23)
CO2: 23 mmol/L (ref 22–32)
Calcium: 8.9 mg/dL (ref 8.9–10.3)
Chloride: 105 mmol/L (ref 98–111)
Creatinine, Ser: 0.92 mg/dL (ref 0.61–1.24)
GFR, Estimated: >60 mL/min (ref >60)
Glucose, Bld: 121 mg/dL — ABNORMAL HIGH (ref 70–99)
Potassium: 3.8 mmol/L (ref 3.5–5.1)
Sodium: 136 mmol/L (ref 135–145)

## 2020-12-19 MED ORDER — LACTULOSE 10 GM/15ML PO SOLN
20.0000 g | Freq: Three times a day (TID) | ORAL | Status: DC
  Administered 2020-12-19: 20 g via ORAL
  Filled 2020-12-19: qty 30

## 2020-12-19 NOTE — Progress Notes (Signed)
Patient stomach is really distended and hard to the touch. He is agitated because he feels he needs more lactulose to have a bm because he takes a lot of lactulose at home on a regular schedule. I contacted MD and relayed the message.

## 2020-12-19 NOTE — Progress Notes (Signed)
[facility] Progress Note  18 Days Post-Op  Subjective: CC-  Did not feel like eating much yesterday, but he did tolerate breakfast this morning. Denies n/v. Feels constipated. Last BM 2 days ago. Passing flatus. He feels like some more lactulose would help.  Objective: Vital signs in last 24 hours: Temp:  [98.2 F (36.8 C)-98.7 F (37.1 C)] 98.6 F (37 C) (03/05 0602) Pulse Rate:  [58-73] 58 (03/05 0602) Resp:  [16] 16 (03/05 0602) BP: (110-131)/(61-75) 110/61 (03/05 0602) SpO2:  [97 %-98 %] 98 % (03/05 0602) Last BM Date: 12/17/20  Intake/Output from previous day: 03/04 0701 - 03/05 0700 In: 1200 [P.O.:1200] Out: -  Intake/Output this shift: No intake/output data recorded.  PE: Gen:  Alert, NAD Pulm:  rate and effort normal Abd: Soft, mild distension, nontender +BS, midline wound with running sutures in place, there is wound dehiscence in the central and inferior aspect of the incision with visible running sutures that close the peritoneum. Peritoneal sutures are intact. There is a very small amt of SSdrainage from between 8th and 9th sutures in the upper portion of his incision currently where the wound has pulled apart some. There is granulation tissue at the base.Still has some surrounding erythema on the right side of the wound but this appears stable/improved from yesterday's picture  Lab Results:  Recent Labs    12/18/20 0434 12/19/20 0101  WBC 5.8 4.7  HGB 9.6* 9.4*  HCT 29.8* 30.3*  PLT 259 248   BMET Recent Labs    12/19/20 0101  NA 136  K 3.8  CL 105  CO2 23  GLUCOSE 121*  BUN 8  CREATININE 0.92  CALCIUM 8.9   PT/INR No results for input(s): LABPROT, INR in the last 72 hours. CMP     Component Value Date/Time   NA 136 12/19/2020 0101   K 3.8 12/19/2020 0101   CL 105 12/19/2020 0101   CO2 23 12/19/2020 0101   GLUCOSE 121 (H) 12/19/2020 0101   BUN 8 12/19/2020 0101   CREATININE 0.92 12/19/2020 0101   CALCIUM 8.9 12/19/2020  0101   PROT 6.7 12/14/2020 0647   ALBUMIN 2.1 (L) 12/14/2020 0647   AST 25 12/14/2020 0647   ALT 19 12/14/2020 0647   ALKPHOS 40 12/14/2020 0647   BILITOT 0.7 12/14/2020 0647   GFRNONAA >60 12/19/2020 0101   Lipase  No results found for: LIPASE     Studies/Results: No results found.  Anti-infectives: Anti-infectives (From admission, onward)   Start     Dose/Rate Route Frequency Ordered Stop   12/13/20 1800  vancomycin (VANCOREADY) IVPB 1000 mg/200 mL        1,000 mg 200 mL/hr over 60 Minutes Intravenous Every 12 hours 12/13/20 0733 12/17/20 1945   12/11/20 1300  vancomycin (VANCOREADY) IVPB 1250 mg/250 mL  Status:  Discontinued        1,250 mg 166.7 mL/hr over 90 Minutes Intravenous Every 12 hours 12/11/20 1230 12/13/20 0733   12/10/20 0900  piperacillin-tazobactam (ZOSYN) IVPB 3.375 g  Status:  Discontinued        3.375 g 100 mL/hr over 30 Minutes Intravenous Every 8 hours 12/10/20 0808 12/10/20 0811   12/10/20 0900  piperacillin-tazobactam (ZOSYN) IVPB 3.375 g        3.375 g 12.5 mL/hr over 240 Minutes Intravenous Every 8 hours 12/10/20 0811 12/17/20 2359   12/01/20 1900  ceFAZolin (ANCEF) 3 g in dextrose 5 % 50 mL IVPB  3 g 100 mL/hr over 30 Minutes Intravenous  Once 12/01/20 1859 12/01/20 1935   12/01/20 1900  metroNIDAZOLE (FLAGYL) IVPB 500 mg        500 mg 100 mL/hr over 60 Minutes Intravenous  Once 12/01/20 1859 12/01/20 2008       Assessment/Plan 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 Dossie Der - POD#18 -Soft diet - CT A/P 2/24 with no acute findings, but the patient had abdominal wall cellulitis around his wound and extending out. He was started on zosyn 2/24, vanc added 2/25. No elevation of WBC, spiked fevers 2/24 but these have resolved now. (last fever 2/25 PM 100.4). Cellulitis improving. He completed 7 days of zosyn/vanc then stop, 3/3. WBC remains WNL and  he is afebrile off abx currently -continue to mobilize - pulm toilet  -JP drain removed on POD 9  ABL anemia- monitor, stable  Hx of EtOH abuse- CIWA Hx of Hep Cs/p treatment Liver Cirrhosis- Ammonia 40 today,he is on lactulose qd, give a second dose today HTN- prn meds Hx ofbipolar and schizophreniaw/ multiple previous suicide attempts- psychstarted on Elavil and Abilify. EKG2/16NSR w/ QTc 415.They are recommending inpatient admission. 1:1 sitter.psych re-consulted 2/28 and they increased abilify and started PO Haldol BID.delirium vs schizophrenia, favor delirium.   HOZ:YYQM diet, bowel regimen VTE: lovenox ID: flagyl/ancef 2/15. Zosyn 2/24>>Vanc 2/25>> Foley:None currently.Voiding  Dispo: abdominal binder AT ALL TIMES. Moist to dry dressing over abd wound. Await inpatient psych placement.   LOS: 18 days    Franne Forts, [facility] 12/19/2020, 8:32 AM Please see Amion for pager number during day hours 7:00am-4:30pm

## 2020-12-20 LAB — AMMONIA: Ammonia: 39 umol/L — ABNORMAL HIGH (ref 9–35)

## 2020-12-20 MED ORDER — LACTULOSE 10 GM/15ML PO SOLN
60.0000 g | Freq: Every day | ORAL | Status: DC
  Administered 2020-12-20 – 2020-12-21 (×2): 60 g via ORAL
  Filled 2020-12-20 (×2): qty 90

## 2020-12-20 NOTE — Progress Notes (Addendum)
[facility] Progress Note  19 Days Post-Op  Subjective: CC-  States that he had a tiny BM yesterday. He is passing a lot of flatus but still thinks he needs more lactulose. At home he reports taking 1 large dose daily with breakfast and this works best for him. Tolerating diet, denies n/v.  Objective: Vital signs in last 24 hours: Temp:  [97.7 F (36.5 C)-98.6 F (37 C)] 97.7 F (36.5 C) (03/06 0622) Pulse Rate:  [61-66] 66 (03/06 0622) Resp:  [17-19] 19 (03/06 0622) BP: (134-149)/(66-78) 149/78 (03/06 0622) SpO2:  [99 %-100 %] 99 % (03/06 0622) Last BM Date: 12/17/20  Intake/Output from previous day: 03/05 0701 - 03/06 0700 In: 540 [P.O.:540] Out: -  Intake/Output this shift: No intake/output data recorded.  PE: Gen:  Alert, NAD Pulm:  rate and effort normal Abd: Soft, mild distension, nontender +BS, midline wound with running sutures in place, there is wound dehiscence in the central and inferior aspect of the incision with visible running sutures that close the peritoneum.Peritoneal sutures are intact. There is a very small amt ofSSdrainage from between8th and 9thsutures in the upper portion of his incision currentlywhere the wound has pulled apart some. There is granulation tissue at the base.Erythema on right side of wound significantly improved     Lab Results:  Recent Labs    12/18/20 0434 12/19/20 0101  WBC 5.8 4.7  HGB 9.6* 9.4*  HCT 29.8* 30.3*  PLT 259 248   BMET Recent Labs    12/19/20 0101  NA 136  K 3.8  CL 105  CO2 23  GLUCOSE 121*  BUN 8  CREATININE 0.92  CALCIUM 8.9   PT/INR No results for input(s): LABPROT, INR in the last 72 hours. CMP     Component Value Date/Time   NA 136 12/19/2020 0101   K 3.8 12/19/2020 0101   CL 105 12/19/2020 0101   CO2 23 12/19/2020 0101   GLUCOSE 121 (H) 12/19/2020 0101   BUN 8 12/19/2020 0101   CREATININE 0.92 12/19/2020 0101   CALCIUM 8.9 12/19/2020 0101   PROT 6.7 12/14/2020  0647   ALBUMIN 2.1 (L) 12/14/2020 0647   AST 25 12/14/2020 0647   ALT 19 12/14/2020 0647   ALKPHOS 40 12/14/2020 0647   BILITOT 0.7 12/14/2020 0647   GFRNONAA >60 12/19/2020 0101   Lipase  No results found for: LIPASE     Studies/Results: No results found.  Anti-infectives: Anti-infectives (From admission, onward)   Start     Dose/Rate Route Frequency Ordered Stop   12/13/20 1800  vancomycin (VANCOREADY) IVPB 1000 mg/200 mL        1,000 mg 200 mL/hr over 60 Minutes Intravenous Every 12 hours 12/13/20 0733 12/17/20 1945   12/11/20 1300  vancomycin (VANCOREADY) IVPB 1250 mg/250 mL  Status:  Discontinued        1,250 mg 166.7 mL/hr over 90 Minutes Intravenous Every 12 hours 12/11/20 1230 12/13/20 0733   12/10/20 0900  piperacillin-tazobactam (ZOSYN) IVPB 3.375 g  Status:  Discontinued        3.375 g 100 mL/hr over 30 Minutes Intravenous Every 8 hours 12/10/20 0808 12/10/20 0811   12/10/20 0900  piperacillin-tazobactam (ZOSYN) IVPB 3.375 g        3.375 g 12.5 mL/hr over 240 Minutes Intravenous Every 8 hours 12/10/20 0811 12/17/20 2359   12/01/20 1900  ceFAZolin (ANCEF) 3 g in dextrose 5 % 50 mL IVPB        3 g  100 mL/hr over 30 Minutes Intravenous  Once 12/01/20 1859 12/01/20 1935   12/01/20 1900  metroNIDAZOLE (FLAGYL) IVPB 500 mg        500 mg 100 mL/hr over 60 Minutes Intravenous  Once 12/01/20 1859 12/01/20 2008       Assessment/Plan 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 Dossie Der - POD#19 -Soft diet - CT A/P 2/24 with no acute findings, but the patient hadabdominal wall cellulitis around his wound and extending out. Hewasstarted on zosyn 2/24, vanc added 2/25. No elevation of WBC, spiked fevers 2/24 but these have resolved now. (last fever 2/25 PM 100.4). Cellulitis improving.He completed7 days of zosyn/vanc then stop, 3/3. WBC remains WNL (checked on 3/5) and he is  afebrile off abx currently -continue to mobilize - pulm toilet  -JP drain removed on POD 9  ABL anemia- monitor, stable  Hx of EtOH abuse- CIWA Hx of Hep Cs/p treatment Liver Cirrhosis-Ammonia 39 today,increase lactulose to 60g qd per patient request (rather than 20g TID) HTN- prn meds Hx ofbipolar and schizophreniaw/ multiple previous suicide attempts- psychstarted on Elavil and Abilify. EKG2/16NSR w/ QTc 415.They are recommending inpatient admission. 1:1 sitter.psych re-consulted 2/28 and they increased abilify and started PO Haldol BID.delirium vs schizophrenia, favor delirium.   IOX:BDZH diet, bowel regimen VTE: lovenox ID: flagyl/ancef 2/15. Zosyn 2/24>>3/3Vanc 2/25>>3/3 Foley:None currently.Voiding  Dispo: abdominal binder AT ALL TIMES. Moist to dry dressing over abd wound. Await inpatient psych placement.   LOS: 19 days    Franne Forts, [facility] 12/20/2020, 8:55 AM Please see Amion for pager number during day hours 7:00am-4:30pm

## 2020-12-21 LAB — AMMONIA: Ammonia: 46 umol/L — ABNORMAL HIGH (ref 9–35)

## 2020-12-21 LAB — BASIC METABOLIC PANEL
Anion gap: 9 (ref 5–15)
BUN: 7 mg/dL — ABNORMAL LOW (ref 8–23)
CO2: 23 mmol/L (ref 22–32)
Calcium: 8.9 mg/dL (ref 8.9–10.3)
Chloride: 104 mmol/L (ref 98–111)
Creatinine, Ser: 0.96 mg/dL (ref 0.61–1.24)
GFR, Estimated: >60 mL/min (ref >60)
Glucose, Bld: 103 mg/dL — ABNORMAL HIGH (ref 70–99)
Potassium: 4 mmol/L (ref 3.5–5.1)
Sodium: 136 mmol/L (ref 135–145)

## 2020-12-21 MED ORDER — ALUM & MAG HYDROXIDE-SIMETH 200-200-20 MG/5ML PO SUSP
15.0000 mL | Freq: Four times a day (QID) | ORAL | Status: DC | PRN
  Administered 2020-12-21 – 2021-01-17 (×3): 15 mL via ORAL
  Filled 2020-12-21 (×3): qty 30

## 2020-12-21 NOTE — TOC Progression Note (Signed)
Transition of Care [facility]) - Progression Note    Patient Details  Name: [name] MRN: [id number] Date of Birth: [birth year]  Transition [facility] Care [facility]) CM/SW Contact  Astrid Drafts Berna Spare, RN Phone Number: 12/21/2020, [phone]  Clinical Narrative:  Notified by [facility] that pt remains on waiting list for Geri-psych admission. Will follow with updates as available.   Patient has been discussed in multidisciplinary Trauma Rounds.        Expected Discharge Plan: [facility] Barriers to Discharge: Continued Medical Work up  Expected Discharge Plan and Services Expected Discharge Plan: [facility]iatric Hospital   Discharge Planning Services: CM Consult   Living arrangements for the past 2 months: Homeless                                       Social Determinants of Health (SDOH) Interventions    Readmission Risk Interventions No flowsheet data found.
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
## 2020-12-22 LAB — AMMONIA: Ammonia: 39 umol/L — ABNORMAL HIGH (ref 9–35)

## 2020-12-22 MED ORDER — LACTULOSE 10 GM/15ML PO SOLN
60.0000 g | Freq: Every day | ORAL | Status: DC
  Administered 2020-12-22 – 2021-01-18 (×28): 60 g via ORAL
  Filled 2020-12-22 (×31): qty 90
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
## 2020-12-22 NOTE — TOC Progression Note (Signed)
Transition of Care [facility]) - Progression Note    Patient Details  Name: [name] MRN: [id number] Date of Birth: 04/23/1956  [facility] CM/SW Contact  Oren Section Cleta Alberts, RN Phone Number: 12/22/2020, 1530 Clinical Narrative:   Met with pt, per his request.  Updated patient on pending [facility] admission.  [facility]  Pt appreciated my visit; will provide updates as available.     Expected Discharge Plan: Psychiatric Hospital Barriers to Discharge: Continued Medical Work up  Expected Discharge Plan and Services Expected Discharge Plan: [facility]   Discharge Planning Services: CM Consult   Living arrangements for the past 2 months: Homeless                                       Social Determinants of Health (SDOH) Interventions    Readmission Risk Interventions No flowsheet data found.  Reinaldo Raddle, RN, BSN  Trauma/Neuro ICU Case Manager [phone]

## 2020-12-23 LAB — AMMONIA: Ammonia: 44 umol/L — ABNORMAL HIGH (ref 9–35)
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
## 2020-12-23 NOTE — Plan of Care (Signed)

## 2020-12-24 LAB — AMMONIA: Ammonia: 38 umol/L — ABNORMAL HIGH (ref 9–35)
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
## 2020-12-24 NOTE — TOC Progression Note (Signed)
Transition of Care [facility]) - Progression Note    Patient Details  Name: [name] MRN: [id number] Date of Birth: Jun 13, 1956  Transition of Care [facility]) CM/SW Contact  Astrid Drafts Berna Spare, RN Phone Number: 12/24/2020, 11:53 AM  Clinical Narrative:  Notified by [facility] Intake that patient remains on waiting list for admission to geri-psych unit.  Will provide updates as available.      Expected Discharge Plan: Psychiatric Hospital Barriers to Discharge: Continued Medical Work up  Expected Discharge Plan and Services Expected Discharge Plan: Psychiatric Hospital   Discharge Planning Services: CM Consult   Living arrangements for the past 2 months: Homeless                                       Social Determinants of Health (SDOH) Interventions    Readmission Risk Interventions No flowsheet data found.  Quintella Baton, RN, BSN  Trauma/Neuro ICU Case Manager [phone]

## 2020-12-24 NOTE — Plan of Care (Signed)

## 2020-12-25 LAB — AMMONIA: Ammonia: 41 umol/L — ABNORMAL HIGH (ref 9–35)
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
## 2020-12-25 NOTE — Plan of Care (Signed)
  Problem: Clinical Measurements: Goal: Will remain free from infection Outcome: Progressing Goal: Diagnostic test results will improve Outcome: Progressing Goal: Respiratory complications will improve Outcome: Progressing Goal: Cardiovascular complication will be avoided Outcome: Progressing   

## 2020-12-25 NOTE — Progress Notes (Signed)
Pt refused abd dressing change this shift.
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
## 2020-12-26 NOTE — Progress Notes (Signed)
Patient asked this writer to step out of the room and that he is not ready for any meds at this time. Patient said he will call this writer if  he is ready for his meds. NP made aware of patient behavior. Will continue to monitor.

## 2020-12-27 NOTE — Progress Notes (Signed)
Progress Note  26 Days Post-Op  Subjective: Patient reports stable lower abdominal pain. Having bowel function. Up and mobilizing some this AM. In better spirits today.   Objective: Vital signs in last 24 hours: Temp:  [98 F (36.7 C)-98.3 F (36.8 C)] 98.3 F (36.8 C) (03/13 0526) Pulse Rate:  [72-92] 72 (03/13 0526) Resp:  [16-20] 16 (03/13 0526) BP: (123-169)/(54-81) 127/54 (03/13 0526) SpO2:  [98 %] 98 % (03/13 0526) Last BM Date: 12/26/20  Intake/Output from previous day: 03/12 0701 - 03/13 0700 In: 960 [P.O.:960] Out: -  Intake/Output this shift: Total I/O In: 240 [P.O.:240] Out: -   PE: Gen: Alert, NAD Pulm: rate and effort normal Abd: Soft,protuberant, nontender,midline wound with running sutures in place,small amount of inferior wound open and clean. No further drainage from superior aspect of wound.No cellulitis   Lab Results:  No results for input(s): WBC, HGB, HCT, PLT in the last 72 hours. BMET No results for input(s): NA, K, CL, CO2, GLUCOSE, BUN, CREATININE, CALCIUM in the last 72 hours. PT/INR No results for input(s): LABPROT, INR in the last 72 hours. CMP     Component Value Date/Time   NA 136 12/21/2020 0213   K 4.0 12/21/2020 0213   CL 104 12/21/2020 0213   CO2 23 12/21/2020 0213   GLUCOSE 103 (H) 12/21/2020 0213   BUN 7 (L) 12/21/2020 0213   CREATININE 0.96 12/21/2020 0213   CALCIUM 8.9 12/21/2020 0213   PROT 6.7 12/14/2020 0647   ALBUMIN 2.1 (L) 12/14/2020 0647   AST 25 12/14/2020 0647   ALT 19 12/14/2020 0647   ALKPHOS 40 12/14/2020 0647   BILITOT 0.7 12/14/2020 0647   GFRNONAA >60 12/21/2020 0213   Lipase  No results found for: LIPASE     Studies/Results: No results found.  Anti-infectives: Anti-infectives (From admission, onward)   Start     Dose/Rate Route Frequency Ordered Stop   12/13/20 1800  vancomycin (VANCOREADY) IVPB 1000 mg/200 mL        1,000 mg 200 mL/hr over 60 Minutes Intravenous Every 12 hours  12/13/20 0733 12/17/20 1945   12/11/20 1300  vancomycin (VANCOREADY) IVPB 1250 mg/250 mL  Status:  Discontinued        1,250 mg 166.7 mL/hr over 90 Minutes Intravenous Every 12 hours 12/11/20 1230 12/13/20 0733   12/10/20 0900  piperacillin-tazobactam (ZOSYN) IVPB 3.375 g  Status:  Discontinued        3.375 g 100 mL/hr over 30 Minutes Intravenous Every 8 hours 12/10/20 0808 12/10/20 0811   12/10/20 0900  piperacillin-tazobactam (ZOSYN) IVPB 3.375 g        3.375 g 12.5 mL/hr over 240 Minutes Intravenous Every 8 hours 12/10/20 0811 12/17/20 2359   12/01/20 1900  ceFAZolin (ANCEF) 3 g in dextrose 5 % 50 mL IVPB        3 g 100 mL/hr over 30 Minutes Intravenous  Once 12/01/20 1859 12/01/20 1935   12/01/20 1900  metroNIDAZOLE (FLAGYL) IVPB 500 mg        500 mg 100 mL/hr over 60 Minutes Intravenous  Once 12/01/20 1859 12/01/20 2008       Assessment/Plan 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 Dossie Der - POD#26 -Soft diet - CT A/P 2/24 with no acute findings, but the patient hadabdominal wall cellulitis around his wound and extending out. Hewasstarted on zosyn 2/24, vanc added 2/25. No elevation of WBC, spiked fevers  2/24 but these have resolved now. (last fever 2/25 PM 100.4). Cellulitis improved.He completed7 days of zosyn/vanc then stop, 3/3.WBC remains WNL(checked on 3/5)and he is afebrile off abx currently -continue to mobilize - pulm toilet  -JP drain removed on POD 9  ABL anemia- monitor, stable  Hx of EtOH abuse- CIWA Hx of Hep Cs/p treatment Liver Cirrhosis-Ammonia 41 (3/11), check PRN, continue lactulose to 60g HTN- prn meds Hx ofbipolar and schizophreniaw/ multiple previous suicide attempts- psychstarted on Elavil and Abilify. EKG2/16NSR w/ QTc 415.They are recommending inpatient admission. 1:1 sitter.psych re-consulted 2/28 and they increased abilify and  started PO Haldol BID.delirium vs schizophrenia, favor delirium.Last psych visit 3/11  HWT:UUEK diet, bowel regimen VTE: lovenox ID: flagyl/ancef 2/15. Zosyn 2/24>>3/3Vanc 2/25>>3/3 Foley:None currently.Voiding  Dispo:Psych consult.Wound stable, continue dressing change. Abdominal binder at all times. Await inpatient psych placement, medically stable for discharge.  LOS: 26 days    Juliet Rude , [facility] 12/27/2020, 9:24 AM Please see Amion for pager number during day hours 7:00am-4:30pm

## 2020-12-28 NOTE — TOC Progression Note (Signed)
Transition of Care [facility]) - Progression Note    Patient Details  Name: [name] MRN: [id number] Date of Birth: 11-11-1955  [facility] CM/SW Contact  Astrid Drafts Berna Spare, RN Phone Number: 12/28/2020, 4:55 PM  Clinical Narrative:  Notified by [facility] Intake that pt remains on the list for geri-psych bed.  Representative could not tell me where patient is on the list.  Will continue to follow with updates as available.      Expected Discharge Plan: Psychiatric Hospital Barriers to Discharge: Continued Medical Work up  Expected Discharge Plan and Services Expected Discharge Plan: Psychiatric Hospital   Discharge Planning Services: CM Consult   Living arrangements for the past 2 months: Homeless                                       Social Determinants of Health (SDOH) Interventions    Readmission Risk Interventions No flowsheet data found.  Quintella Baton, RN, BSN  Trauma/Neuro ICU Case Manager [phone]

## 2020-12-28 NOTE — Progress Notes (Signed)
Progress Note  27 Days Post-Op  Subjective: No new complaints. Having bowel function. mobilizing in his room this AM. Asking to go outside for fresh air with a sitter.  Objective: Vital signs in last 24 hours: Temp:  [97.7 F (36.5 C)-98 F (36.7 C)] 98 F (36.7 C) (03/14 0607) Pulse Rate:  [68-72] 68 (03/14 0607) Resp:  [16-18] 18 (03/14 0607) BP: (130-134)/(68-77) 132/68 (03/14 0607) SpO2:  [97 %-99 %] 99 % (03/14 0607) Last BM Date: 12/27/20  Intake/Output from previous day: 03/13 0701 - 03/14 0700 In: 1560 [P.O.:1560] Out: -  Intake/Output this shift: No intake/output data recorded.  PE: Gen: Alert, NAD Pulm: rate and effort normal Abd: Soft,protuberant, nontender,midline wound with running sutures in place,small amount of inferior wound open and clean. No further drainage from superior aspect of wound.No cellulitis    Lab Results:  CMP     Component Value Date/Time   NA 136 12/21/2020 0213   K 4.0 12/21/2020 0213   CL 104 12/21/2020 0213   CO2 23 12/21/2020 0213   GLUCOSE 103 (H) 12/21/2020 0213   BUN 7 (L) 12/21/2020 0213   CREATININE 0.96 12/21/2020 0213   CALCIUM 8.9 12/21/2020 0213   PROT 6.7 12/14/2020 0647   ALBUMIN 2.1 (L) 12/14/2020 0647   AST 25 12/14/2020 0647   ALT 19 12/14/2020 0647   ALKPHOS 40 12/14/2020 0647   BILITOT 0.7 12/14/2020 0647   GFRNONAA >60 12/21/2020 0213   Lipase  No results found for: LIPASE     Studies/Results: No results found.  Anti-infectives: Anti-infectives (From admission, onward)   Start     Dose/Rate Route Frequency Ordered Stop   12/13/20 1800  vancomycin (VANCOREADY) IVPB 1000 mg/200 mL        1,000 mg 200 mL/hr over 60 Minutes Intravenous Every 12 hours 12/13/20 0733 12/17/20 1945   12/11/20 1300  vancomycin (VANCOREADY) IVPB 1250 mg/250 mL  Status:  Discontinued        1,250 mg 166.7 mL/hr over 90 Minutes Intravenous Every 12 hours 12/11/20 1230 12/13/20 0733   12/10/20 0900   piperacillin-tazobactam (ZOSYN) IVPB 3.375 g  Status:  Discontinued        3.375 g 100 mL/hr over 30 Minutes Intravenous Every 8 hours 12/10/20 0808 12/10/20 0811   12/10/20 0900  piperacillin-tazobactam (ZOSYN) IVPB 3.375 g        3.375 g 12.5 mL/hr over 240 Minutes Intravenous Every 8 hours 12/10/20 0811 12/17/20 2359   12/01/20 1900  ceFAZolin (ANCEF) 3 g in dextrose 5 % 50 mL IVPB        3 g 100 mL/hr over 30 Minutes Intravenous  Once 12/01/20 1859 12/01/20 1935   12/01/20 1900  metroNIDAZOLE (FLAGYL) IVPB 500 mg        500 mg 100 mL/hr over 60 Minutes Intravenous  Once 12/01/20 1859 12/01/20 2008       Assessment/Plan 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 Dossie Der - POD#27 -Soft diet - CT A/P 2/24 with no acute findings, but the patient hadabdominal wall cellulitis around his wound and extending out. Hewasstarted on zosyn 2/24, vanc added 2/25. No elevation of WBC, spiked fevers 2/24 but these have resolved now. (last fever 2/25 PM 100.4). Cellulitis improved.He completed7 days of zosyn/vanc then stop, 3/3.WBC remains WNL(checked on 3/5)and he is afebrile off abx currently -continue to mobilize - pulm toilet  -JP drain removed on POD 9  ABL anemia- monitor, stable  Hx of EtOH abuse- CIWA Hx of Hep Cs/p treatment Liver Cirrhosis-Ammonia 41 (3/11), check PRN, continue lactulose to 60g HTN- prn meds Hx ofbipolar and schizophreniaw/ multiple previous suicide attempts- psychstarted on Elavil and Abilify. EKG2/16NSR w/ QTc 415.They are recommending inpatient admission. 1:1 sitter.psych re-consulted 2/28 and they increased abilify and started PO Haldol BID.delirium vs schizophrenia, favor delirium.Last psych visit 3/11  HRC:BULA diet, bowel regimen VTE: lovenox ID: flagyl/ancef 2/15. Zosyn 2/24>>3/3Vanc 2/25>>3/3 Foley:None  currently.Voiding  Dispo:Wound stable, continue dressing change. Abdominal binder at all times. Await inpatient psych placement, medically stable for discharge.   LOS: 27 days    Adam Phenix , [facility] 12/28/2020, 8:00 AM Please see Amion for pager number during day hours 7:00am-4:30pm

## 2020-12-29 MED ORDER — MELATONIN 5 MG PO TABS
10.0000 mg | ORAL_TABLET | Freq: Every day | ORAL | Status: DC
  Administered 2020-12-29 – 2021-01-17 (×20): 10 mg via ORAL
  Filled 2020-12-29 (×20): qty 2

## 2020-12-29 MED ORDER — ARIPIPRAZOLE 10 MG PO TABS
15.0000 mg | ORAL_TABLET | Freq: Every day | ORAL | Status: DC
  Administered 2020-12-30 – 2021-01-18 (×20): 15 mg via ORAL
  Filled 2020-12-29 (×20): qty 2

## 2020-12-29 MED ORDER — HALOPERIDOL 5 MG PO TABS
5.0000 mg | ORAL_TABLET | Freq: Every day | ORAL | Status: DC
  Administered 2020-12-29 – 2021-01-17 (×20): 5 mg via ORAL
  Filled 2020-12-29 (×22): qty 1

## 2020-12-29 NOTE — Progress Notes (Addendum)
Progress Note  28 Days Post-Op  Subjective: C/o not being able to rest and asking that I adjust his medications to help. Reports increased depression and anger along with racing thoughts at night. Having bowel function.   Objective: Vital signs in last 24 hours: Temp:  [97.8 F (36.6 C)-98.1 F (36.7 C)] 97.8 F (36.6 C) (03/15 0500) Pulse Rate:  [70-85] 70 (03/15 0500) Resp:  [16-20] 20 (03/15 0500) BP: (125-170)/(74-86) 137/77 (03/15 0500) SpO2:  [97 %-99 %] 99 % (03/15 0500) Last BM Date: 12/28/20  Intake/Output from previous day: 03/14 0701 - 03/15 0700 In: 1680 [P.O.:1680] Out: 3 [Stool:3] Intake/Output this shift: No intake/output data recorded.  PE: Gen: Alert, NAD Pulm: rate and effort normal Abd: Soft,protuberant, nontender,midline wound with running sutures in place,small amount of inferior wound open and clean (~90% granulation tissue, ~10% fibrinous exudate) No further drainage from superior aspect of wound.No cellulitis    Lab Results:  CMP     Component Value Date/Time   NA 136 12/21/2020 0213   K 4.0 12/21/2020 0213   CL 104 12/21/2020 0213   CO2 23 12/21/2020 0213   GLUCOSE 103 (H) 12/21/2020 0213   BUN 7 (L) 12/21/2020 0213   CREATININE 0.96 12/21/2020 0213   CALCIUM 8.9 12/21/2020 0213   PROT 6.7 12/14/2020 0647   ALBUMIN 2.1 (L) 12/14/2020 0647   AST 25 12/14/2020 0647   ALT 19 12/14/2020 0647   ALKPHOS 40 12/14/2020 0647   BILITOT 0.7 12/14/2020 0647   GFRNONAA >60 12/21/2020 0213   Lipase  No results found for: LIPASE     Studies/Results: No results found.  Anti-infectives: Anti-infectives (From admission, onward)   Start     Dose/Rate Route Frequency Ordered Stop   12/13/20 1800  vancomycin (VANCOREADY) IVPB 1000 mg/200 mL        1,000 mg 200 mL/hr over 60 Minutes Intravenous Every 12 hours 12/13/20 0733 12/17/20 1945   12/11/20 1300  vancomycin (VANCOREADY) IVPB 1250 mg/250 mL  Status:  Discontinued        1,250  mg 166.7 mL/hr over 90 Minutes Intravenous Every 12 hours 12/11/20 1230 12/13/20 0733   12/10/20 0900  piperacillin-tazobactam (ZOSYN) IVPB 3.375 g  Status:  Discontinued        3.375 g 100 mL/hr over 30 Minutes Intravenous Every 8 hours 12/10/20 0808 12/10/20 0811   12/10/20 0900  piperacillin-tazobactam (ZOSYN) IVPB 3.375 g        3.375 g 12.5 mL/hr over 240 Minutes Intravenous Every 8 hours 12/10/20 0811 12/17/20 2359   12/01/20 1900  ceFAZolin (ANCEF) 3 g in dextrose 5 % 50 mL IVPB        3 g 100 mL/hr over 30 Minutes Intravenous  Once 12/01/20 1859 12/01/20 1935   12/01/20 1900  metroNIDAZOLE (FLAGYL) IVPB 500 mg        500 mg 100 mL/hr over 60 Minutes Intravenous  Once 12/01/20 1859 12/01/20 2008       Assessment/Plan 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 Dossie Der - POD#28 -Soft diet - CT A/P 2/24 with no acute findings, but the patient hadabdominal wall cellulitis around his wound and extending out. Hewasstarted on zosyn 2/24, vanc added 2/25. No elevation of WBC, spiked fevers 2/24 but these have resolved now. (last fever 2/25 PM 100.4). Cellulitis improved.He completed7 days of zosyn/vanc then stop, 3/3.WBC remains WNL(checked on 3/5)and he is afebrile off abx  currently -continue to mobilize - pulm toilet  -JP drain removed on POD 9  ABL anemia- monitor, stable  Hx of EtOH abuse- CIWA Hx of Hep Cs/p treatment Liver Cirrhosis-Ammonia 41 (3/11), check PRN, continue lactulose to 60g HTN- prn meds Hx ofbipolar and schizophreniaw/ multiple previous suicide attempts- psychstarted on Elavil and Abilify. EKG2/16NSR w/ QTc 415.They are recommending inpatient admission. 1:1 sitter.psych re-consulted 2/28 and they increased abilify and started PO Haldol BID.delirium vs schizophrenia, favor delirium.Last psych visit 3/11  GUR:KYHC diet, bowel regimen VTE:  lovenox ID: flagyl/ancef 2/15. Zosyn 2/24>>3/3Vanc 2/25>>3/3 Foley:None currently.Voiding  Dispo:Wound stable, continue dressing change. Abdominal binder at all times. Await inpatient psych placement, medically stable for discharge. Re-consult psych today to assist with medication adjustments.   LOS: 28 days    Adam Phenix , [facility] 12/29/2020, 7:36 AM Please see Amion for pager number during day hours 7:00am-4:30pm
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
## 2020-12-30 ENCOUNTER — Encounter (HOSPITAL_COMMUNITY): Payer: Self-pay

## 2020-12-30 ENCOUNTER — Other Ambulatory Visit: Payer: Self-pay
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
## 2020-12-30 NOTE — Progress Notes (Signed)
Patient refusing abdominal binder. Instructed on the importance of wearing the binder and how it will help with wound healing but patient still refusing to wear. Lajoyce Corners is at the bedside and ordered another to make it fit better if paired together. Patient still not cooperative.
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
## 2020-12-31 NOTE — Plan of Care (Signed)

## 2020-12-31 NOTE — TOC Progression Note (Signed)
Transition of Care [facility]) - Progression Note    Patient Details  Name: DRACO MALCZEWSKI MRN: [id number] Date of Birth: [birth year]  [facility] CM/SW Contact  Astrid Drafts Berna Spare, RN Phone Number: 12/31/2020, 9:27am  Clinical Narrative:  Notified by intake at [facility] that patient remains on the list for geri-psych bed.  Will follow with updates as available.       Expected Discharge Plan: Psychiatric Hospital Barriers to Discharge: Continued Medical Work up  Expected Discharge Plan and Services Expected Discharge Plan: Psychiatric Hospital   Discharge Planning Services: CM Consult   Living arrangements for the past 2 months: Homeless                                       Social Determinants of Health (SDOH) Interventions    Readmission Risk Interventions No flowsheet data found.  Quintella Baton, RN, BSN  Trauma/Neuro ICU Case Manager [phone]

## 2020-12-31 NOTE — Plan of Care (Signed)
  Problem: Education: Goal: Knowledge of General Education information will improve Description Including pain rating scale, medication(s)/side effects and non-pharmacologic comfort measures Outcome: Progressing   

## 2021-01-01 MED ORDER — AMITRIPTYLINE HCL 50 MG PO TABS
150.0000 mg | ORAL_TABLET | Freq: Every day | ORAL | Status: DC
  Administered 2021-01-01 – 2021-01-17 (×17): 150 mg via ORAL
  Filled 2021-01-01 (×3): qty 3
  Filled 2021-01-01: qty 6
  Filled 2021-01-01 (×14): qty 3

## 2021-01-01 MED ORDER — AMITRIPTYLINE HCL 50 MG PO TABS: 100.0000 mg | ORAL_TABLET | Freq: Every day | ORAL | Status: DC
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
## 2021-01-01 NOTE — Plan of Care (Signed)
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
## 2021-01-02 NOTE — Plan of Care (Signed)

## 2021-01-03 NOTE — Progress Notes (Signed)
33 Days Post-Op  Subjective: CC: No acute changes overnight.  Reports stable abdominal pain.  He is tolerating a diet without any nausea or vomiting.  Reports BM yesterday.  Objective: Vital signs in last 24 hours: Temp:  [98 F (36.7 C)-98.2 F (36.8 C)] 98.2 F (36.8 C) (03/20 0500) Pulse Rate:  [70-75] 70 (03/20 0500) Resp:  [18-20] 20 (03/20 0500) BP: (154-165)/(74-80) 155/74 (03/20 0500) SpO2:  [97 %-98 %] 98 % (03/20 0500) Last BM Date: 01/01/21  Intake/Output from previous day: 03/19 0701 - 03/20 0700 In: 1040 [P.O.:1040] Out: -  Intake/Output this shift: No intake/output data recorded.  PE: Gen: Alert, NAD Heart: Reg Pulm: CTA b/l, rate and effort normal Abd: Soft,protuberant, nontender,midline wound with running sutures in place,small amount of inferior wound open and clean. No drainage.No cellulitis Msk: No LE edema  Lab Results:  No results for input(s): WBC, HGB, HCT, PLT in the last 72 hours. BMET No results for input(s): NA, K, CL, CO2, GLUCOSE, BUN, CREATININE, CALCIUM in the last 72 hours. PT/INR No results for input(s): LABPROT, INR in the last 72 hours. CMP     Component Value Date/Time   NA 136 12/21/2020 0213   K 4.0 12/21/2020 0213   CL 104 12/21/2020 0213   CO2 23 12/21/2020 0213   GLUCOSE 103 (H) 12/21/2020 0213   BUN 7 (L) 12/21/2020 0213   CREATININE 0.96 12/21/2020 0213   CALCIUM 8.9 12/21/2020 0213   PROT 6.7 12/14/2020 0647   ALBUMIN 2.1 (L) 12/14/2020 0647   AST 25 12/14/2020 0647   ALT 19 12/14/2020 0647   ALKPHOS 40 12/14/2020 0647   BILITOT 0.7 12/14/2020 0647   GFRNONAA >60 12/21/2020 0213   Lipase  No results found for: LIPASE     Studies/Results: No results found.  Anti-infectives: Anti-infectives (From admission, onward)   Start     Dose/Rate Route Frequency Ordered Stop   12/13/20 1800  vancomycin (VANCOREADY) IVPB 1000 mg/200 mL        1,000 mg 200 mL/hr over 60 Minutes Intravenous Every 12 hours  12/13/20 0733 12/17/20 1945   12/11/20 1300  vancomycin (VANCOREADY) IVPB 1250 mg/250 mL  Status:  Discontinued        1,250 mg 166.7 mL/hr over 90 Minutes Intravenous Every 12 hours 12/11/20 1230 12/13/20 0733   12/10/20 0900  piperacillin-tazobactam (ZOSYN) IVPB 3.375 g  Status:  Discontinued        3.375 g 100 mL/hr over 30 Minutes Intravenous Every 8 hours 12/10/20 0808 12/10/20 0811   12/10/20 0900  piperacillin-tazobactam (ZOSYN) IVPB 3.375 g        3.375 g 12.5 mL/hr over 240 Minutes Intravenous Every 8 hours 12/10/20 0811 12/17/20 2359   12/01/20 1900  ceFAZolin (ANCEF) 3 g in dextrose 5 % 50 mL IVPB        3 g 100 mL/hr over 30 Minutes Intravenous  Once 12/01/20 1859 12/01/20 1935   12/01/20 1900  metroNIDAZOLE (FLAGYL) IVPB 500 mg        500 mg 100 mL/hr over 60 Minutes Intravenous  Once 12/01/20 1859 12/01/20 2008       Assessment/Plan 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 Dossie Der - POD#31 -Soft diet - CT A/P 2/24 with no acute findings, but the patient hadabdominal wall cellulitis around his wound and extending out. Hewasstarted on zosyn 2/24, vanc added 2/25. No elevation of WBC, spiked  fevers 2/24 but these have resolved now. (last fever 2/25 PM 100.4). Cellulitis improved.He completed7 days of zosyn/vanc then stop, 3/3.WBC remains WNL(checked on 3/5)and he is afebrile off abx currently -continue to mobilize - pulm toilet  -JP drain removed on POD 9  ABL anemia- stable on last labs (3/5) Hx of EtOH abuse Hx of Hep Cs/p treatment Liver Cirrhosis-Ammonia 41 (3/11), check PRN, continue lactulose to 60g HTN- prn meds Hx ofbipolar and schizophreniaw/ multiple previous suicide attempts- psych saw yesterday and continue to recommend inpatient psych. They made med adjustments to Elavil, Abilify and haldol. EKG 3/16 QTc 410.Continue 1:1 sitter.  HCW:CBJS  diet, bowel regimen VTE: lovenox ID: flagyl/ancef 2/15. Zosyn 2/24>>3/3Vanc 2/25>>3/3 Foley:None currently.Voiding  Dispo:Wound stable, continue dressing change.Abdominal binder when sitting up or OOB.Corporate investment banker.Await inpatient psych placement   LOS: 33 days    Jacinto Halim , [facility] 01/03/2021, 9:11 AM Please see Amion for pager number during day hours 7:00am-4:30pm

## 2021-01-04 ENCOUNTER — Encounter (HOSPITAL_COMMUNITY): Payer: Self-pay

## 2021-01-04 MED ORDER — OXYCODONE HCL 5 MG PO TABS
5.0000 mg | ORAL_TABLET | Freq: Three times a day (TID) | ORAL | Status: DC | PRN
Start: 2021-01-04 — End: 2021-01-18
  Administered 2021-01-04 – 2021-01-09 (×11): 10 mg via ORAL
  Administered 2021-01-09: 5 mg via ORAL
  Administered 2021-01-09 – 2021-01-16 (×13): 10 mg via ORAL
  Administered 2021-01-17: 5 mg via ORAL
  Administered 2021-01-17 – 2021-01-18 (×2): 10 mg via ORAL
  Filled 2021-01-04 (×30): qty 2

## 2021-01-04 NOTE — TOC Progression Note (Signed)
Transition of Care [facility]) - Progression Note    Patient Details  Name: [name] MRN: [id number] Date of Birth: 07/07/1956  [facility] Care [facility]) CM/SW Contact  Astrid Drafts Berna Spare, RN Phone Number: 01/04/2021, 11:51 AM  Clinical Narrative:   Notified by Jennette Kettle at [facility] Intake that patient remains on the list for geri-psych bed.  Will continue to follow/provide updates as available.     Expected Discharge Plan: Psychiatric Hospital Barriers to Discharge: Continued Medical Work up  Expected Discharge Plan and Services Expected Discharge Plan: Psychiatric Hospital   Discharge Planning Services: CM Consult   Living arrangements for the past 2 months: Homeless                                       Social Determinants of Health (SDOH) Interventions    Readmission Risk Interventions No flowsheet data found.  Quintella Baton, RN, BSN  Trauma/Neuro ICU Case Manager [phone]

## 2021-01-04 NOTE — Progress Notes (Signed)
34 Days Post-Op  Subjective: CC: No acute changes overnight.    Objective: Vital signs in last 24 hours: Temp:  [97.8 F (36.6 C)-98.1 F (36.7 C)] 98.1 F (36.7 C) (03/21 7209) Pulse Rate:  [64-69] 68 (03/21 0632) Resp:  [18] 18 (03/21 4709) BP: (136-138)/(69-82) 138/82 (03/21 0632) SpO2:  [97 %-100 %] 98 % (03/21 0632) Last BM Date: 01/01/21  Intake/Output from previous day: 03/20 0701 - 03/21 0700 In: 1080 [P.O.:1080] Out: -  Intake/Output this shift: No intake/output data recorded.  PE: Gen: Alert, NAD Heart: Reg Pulm: CTA b/l, rate and effort normal Abd: Soft,protuberant, nontender,abd binder in place. Msk: No LE edema  Lab Results:  CMP     Component Value Date/Time   NA 136 12/21/2020 0213   K 4.0 12/21/2020 0213   CL 104 12/21/2020 0213   CO2 23 12/21/2020 0213   GLUCOSE 103 (H) 12/21/2020 0213   BUN 7 (L) 12/21/2020 0213   CREATININE 0.96 12/21/2020 0213   CALCIUM 8.9 12/21/2020 0213   PROT 6.7 12/14/2020 0647   ALBUMIN 2.1 (L) 12/14/2020 0647   AST 25 12/14/2020 0647   ALT 19 12/14/2020 0647   ALKPHOS 40 12/14/2020 0647   BILITOT 0.7 12/14/2020 0647   GFRNONAA >60 12/21/2020 0213   Lipase  No results found for: LIPASE     Studies/Results: No results found.  Anti-infectives: Anti-infectives (From admission, onward)   Start     Dose/Rate Route Frequency Ordered Stop   12/13/20 1800  vancomycin (VANCOREADY) IVPB 1000 mg/200 mL        1,000 mg 200 mL/hr over 60 Minutes Intravenous Every 12 hours 12/13/20 0733 12/17/20 1945   12/11/20 1300  vancomycin (VANCOREADY) IVPB 1250 mg/250 mL  Status:  Discontinued        1,250 mg 166.7 mL/hr over 90 Minutes Intravenous Every 12 hours 12/11/20 1230 12/13/20 0733   12/10/20 0900  piperacillin-tazobactam (ZOSYN) IVPB 3.375 g  Status:  Discontinued        3.375 g 100 mL/hr over 30 Minutes Intravenous Every 8 hours 12/10/20 0808 12/10/20 0811   12/10/20 0900  piperacillin-tazobactam (ZOSYN)  IVPB 3.375 g        3.375 g 12.5 mL/hr over 240 Minutes Intravenous Every 8 hours 12/10/20 0811 12/17/20 2359   12/01/20 1900  ceFAZolin (ANCEF) 3 g in dextrose 5 % 50 mL IVPB        3 g 100 mL/hr over 30 Minutes Intravenous  Once 12/01/20 1859 12/01/20 1935   12/01/20 1900  metroNIDAZOLE (FLAGYL) IVPB 500 mg        500 mg 100 mL/hr over 60 Minutes Intravenous  Once 12/01/20 1859 12/01/20 2008       Assessment/Plan 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 Dossie Der - POD#32 -Soft diet - CT A/P 2/24 with no acute findings, but the patient hadabdominal wall cellulitis around his wound and extending out. Hewasstarted on zosyn 2/24, vanc added 2/25. No elevation of WBC, spiked fevers 2/24 but these have resolved now. (last fever 2/25 PM 100.4). Cellulitis improved.He completed7 days of zosyn/vanc then stop, 3/3.WBC remains WNL(checked on 3/5)and he is afebrile off abx currently -continue to mobilize - pulm toilet  -JP drain removed on POD 9  ABL anemia- stable on last labs (3/5) Hx of EtOH abuse Hx of Hep Cs/p treatment Liver Cirrhosis-Ammonia 41 (3/11), check PRN, continue lactulose to 60g HTN- prn meds  Hx ofbipolar and schizophreniaw/ multiple previous suicide attempts- psych saw 3/18 and continue to recommend inpatient psych. They made med adjustments to Elavil, Abilify and haldol. EKG 3/16 QTc 410.Continue 1:1 sitter.  QAS:UORV diet, bowel regimen VTE: lovenox ID: flagyl/ancef 2/15. Zosyn 2/24>>3/3Vanc 2/25>>3/3 Foley:None currently.Voiding  Dispo:Wound stable, continue dressing change.Abdominal binder when sitting up or OOB.Corporate investment banker.Await inpatient psych placement   LOS: 34 days    [name] , [name] 01/04/2021, 8:35 AM Please see Amion for pager number during day hours 7:00am-4:30pm

## 2021-01-05 NOTE — Progress Notes (Signed)
35 Days Post-Op  Subjective: CC: No acute changes overnight.  States he wants to leave.  Objective: Vital signs in last 24 hours: Temp:  [97.9 F (36.6 C)-98.2 F (36.8 C)] 98.2 F (36.8 C) (03/22 0500) Pulse Rate:  [71-79] 72 (03/22 0500) Resp:  [17-20] 18 (03/22 0500) BP: (150-153)/(77-81) 150/77 (03/22 0500) SpO2:  [97 %-98 %] 98 % (03/22 0500) Last BM Date: 01/01/21  Intake/Output from previous day: 03/21 0701 - 03/22 0700 In: 1200 [P.O.:1200] Out: -  Intake/Output this shift: No intake/output data recorded.  PE: Gen: Alert, NAD Heart: Reg Pulm: CTA b/l, rate and effort normal Abd: Soft,protuberant, nontender,abd binder in place. Msk: No LE edema  Lab Results:  CMP     Component Value Date/Time   NA 136 12/21/2020 0213   K 4.0 12/21/2020 0213   CL 104 12/21/2020 0213   CO2 23 12/21/2020 0213   GLUCOSE 103 (H) 12/21/2020 0213   BUN 7 (L) 12/21/2020 0213   CREATININE 0.96 12/21/2020 0213   CALCIUM 8.9 12/21/2020 0213   PROT 6.7 12/14/2020 0647   ALBUMIN 2.1 (L) 12/14/2020 0647   AST 25 12/14/2020 0647   ALT 19 12/14/2020 0647   ALKPHOS 40 12/14/2020 0647   BILITOT 0.7 12/14/2020 0647   GFRNONAA >60 12/21/2020 0213   Lipase  No results found for: LIPASE     Studies/Results: No results found.  Anti-infectives: Anti-infectives (From admission, onward)   Start     Dose/Rate Route Frequency Ordered Stop   12/13/20 1800  vancomycin (VANCOREADY) IVPB 1000 mg/200 mL        1,000 mg 200 mL/hr over 60 Minutes Intravenous Every 12 hours 12/13/20 0733 12/17/20 1945   12/11/20 1300  vancomycin (VANCOREADY) IVPB 1250 mg/250 mL  Status:  Discontinued        1,250 mg 166.7 mL/hr over 90 Minutes Intravenous Every 12 hours 12/11/20 1230 12/13/20 0733   12/10/20 0900  piperacillin-tazobactam (ZOSYN) IVPB 3.375 g  Status:  Discontinued        3.375 g 100 mL/hr over 30 Minutes Intravenous Every 8 hours 12/10/20 0808 12/10/20 0811   12/10/20 0900   piperacillin-tazobactam (ZOSYN) IVPB 3.375 g        3.375 g 12.5 mL/hr over 240 Minutes Intravenous Every 8 hours 12/10/20 0811 12/17/20 2359   12/01/20 1900  ceFAZolin (ANCEF) 3 g in dextrose 5 % 50 mL IVPB        3 g 100 mL/hr over 30 Minutes Intravenous  Once 12/01/20 1859 12/01/20 1935   12/01/20 1900  metroNIDAZOLE (FLAGYL) IVPB 500 mg        500 mg 100 mL/hr over 60 Minutes Intravenous  Once 12/01/20 1859 12/01/20 2008       Assessment/Plan 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 Dossie Der - POD#35 -Soft diet - CT A/P 2/24 with no acute findings, but the patient hadabdominal wall cellulitis around his wound and extending out. Hewasstarted on zosyn 2/24, vanc added 2/25. No elevation of WBC, spiked fevers 2/24 but these have resolved now. (last fever 2/25 PM 100.4). Cellulitis improved.He completed7 days of zosyn/vanc then stop, 3/3.WBC remains WNL(checked on 3/5)and he is afebrile off abx currently -continue to mobilize - pulm toilet  -JP drain removed on POD 9  ABL anemia- stable on last labs (3/5) Hx of EtOH abuse Hx of Hep Cs/p treatment Liver Cirrhosis-Ammonia 41 (3/11), check PRN, continue lactulose to  60g HTN- prn meds Hx ofbipolar and schizophreniaw/ multiple previous suicide attempts- psych saw 3/18 and continue to recommend inpatient psych. They made med adjustments to Elavil, Abilify and haldol. EKG 3/16 QTc 410.Continue 1:1 sitter.  QVZ:DGLO diet, bowel regimen VTE: lovenox ID: flagyl/ancef 2/15. Zosyn 2/24>>3/3Vanc 2/25>>3/3 Foley:None currently.Voiding  Dispo:Wound stable, continue dressing change.Abdominal binder when sitting up or OOB.Corporate investment banker.Await inpatient psych placement   LOS: 35 days    Adam Phenix , [facility] 01/05/2021, 7:47 AM Please see Amion for pager number during day hours  7:00am-4:30pm

## 2021-01-05 NOTE — Progress Notes (Signed)
Patient cooperative with care and meds. Showered today at 1200; changed dressing per order after patient got out of shower. Pain managed with prn oxy.

## 2021-01-06 MED ORDER — MICONAZOLE NITRATE POWD
Freq: Two times a day (BID) | Status: DC
  Filled 2021-01-06 (×3): qty 100

## 2021-01-06 MED ORDER — NAPHAZOLINE-GLYCERIN 0.012-0.2 % OP SOLN
1.0000 [drp] | Freq: Four times a day (QID) | OPHTHALMIC | Status: DC | PRN
Start: 2021-01-06 — End: 2021-01-18
  Administered 2021-01-06: 1 [drp] via OPHTHALMIC
  Administered 2021-01-07 – 2021-01-09 (×2): 2 [drp] via OPHTHALMIC
  Administered 2021-01-11: 1 [drp] via OPHTHALMIC
  Filled 2021-01-06: qty 15

## 2021-01-06 NOTE — Progress Notes (Signed)
36 Days Post-Op  Subjective: CC: No acute changes overnight. C/o dry eyes. Also states he is about to run out of his antifungal powder for his groin. Again expresses irritation about being in the hospital.   Objective: Vital signs in last 24 hours: Temp:  [97.5 F (36.4 C)-97.8 F (36.6 C)] 97.5 F (36.4 C) (03/23 0639) Pulse Rate:  [61-80] 61 (03/23 0639) Resp:  [18-20] 18 (03/23 0639) BP: (147-157)/(74-86) 147/74 (03/23 0639) SpO2:  [96 %-99 %] 98 % (03/23 0639) Last BM Date: 01/04/21  Intake/Output from previous day: 03/22 0701 - 03/23 0700 In: 1560 [P.O.:1560] Out: -  Intake/Output this shift: Total I/O In: 240 [P.O.:240] Out: -   PE: Gen: Alert, NAD Heart: Reg Pulm: CTA b/l, rate and effort normal Abd: Soft,protuberant, nontender,midline incision - w running sutures in place,small amount of inferior wound open and clean (~85% granulation tissue, ~15% fibrinous exudate). There is a new opening in the wound proximal to known area of dehiscence - it is <1cm in size - no drainage. No cellulitis. Msk: No LE edema  Lab Results:  CMP     Component Value Date/Time   NA 136 12/21/2020 0213   K 4.0 12/21/2020 0213   CL 104 12/21/2020 0213   CO2 23 12/21/2020 0213   GLUCOSE 103 (H) 12/21/2020 0213   BUN 7 (L) 12/21/2020 0213   CREATININE 0.96 12/21/2020 0213   CALCIUM 8.9 12/21/2020 0213   PROT 6.7 12/14/2020 0647   ALBUMIN 2.1 (L) 12/14/2020 0647   AST 25 12/14/2020 0647   ALT 19 12/14/2020 0647   ALKPHOS 40 12/14/2020 0647   BILITOT 0.7 12/14/2020 0647   GFRNONAA >60 12/21/2020 0213   Lipase  No results found for: LIPASE     Studies/Results: No results found.  Anti-infectives: Anti-infectives (From admission, onward)   Start     Dose/Rate Route Frequency Ordered Stop   12/13/20 1800  vancomycin (VANCOREADY) IVPB 1000 mg/200 mL        1,000 mg 200 mL/hr over 60 Minutes Intravenous Every 12 hours 12/13/20 0733 12/17/20 1945   12/11/20 1300   vancomycin (VANCOREADY) IVPB 1250 mg/250 mL  Status:  Discontinued        1,250 mg 166.7 mL/hr over 90 Minutes Intravenous Every 12 hours 12/11/20 1230 12/13/20 0733   12/10/20 0900  piperacillin-tazobactam (ZOSYN) IVPB 3.375 g  Status:  Discontinued        3.375 g 100 mL/hr over 30 Minutes Intravenous Every 8 hours 12/10/20 0808 12/10/20 0811   12/10/20 0900  piperacillin-tazobactam (ZOSYN) IVPB 3.375 g        3.375 g 12.5 mL/hr over 240 Minutes Intravenous Every 8 hours 12/10/20 0811 12/17/20 2359   12/01/20 1900  ceFAZolin (ANCEF) 3 g in dextrose 5 % 50 mL IVPB        3 g 100 mL/hr over 30 Minutes Intravenous  Once 12/01/20 1859 12/01/20 1935   12/01/20 1900  metroNIDAZOLE (FLAGYL) IVPB 500 mg        500 mg 100 mL/hr over 60 Minutes Intravenous  Once 12/01/20 1859 12/01/20 2008       Assessment/Plan 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 Dossie Der - POD#35 -Soft diet - CT A/P 2/24 with no acute findings, but the patient hadabdominal wall cellulitis around his wound and extending out. Hewasstarted on zosyn 2/24, vanc added 2/25. No elevation of WBC, spiked fevers 2/24 but  these have resolved now. (last fever 2/25 PM 100.4). Cellulitis improved.He completed7 days of zosyn/vanc then stop, 3/3.WBC remains WNL(checked on 3/5)and he is afebrile off abx currently -continue to mobilize - pulm toilet  -JP drain removed on POD 9  ABL anemia- stable on last labs (3/5) Hx of EtOH abuse Hx of Hep Cs/p treatment Liver Cirrhosis-Ammonia 41 (3/11), check PRN, continue lactulose to 60g HTN- prn meds Hx ofbipolar and schizophreniaw/ multiple previous suicide attempts- psych saw 3/18 and continue to recommend inpatient psych. They made med adjustments to Elavil, Abilify and haldol. EKG 3/16 QTc 410.Continue 1:1 sitter.  KGU:RKYH diet, bowel regimen VTE: lovenox ID: flagyl/ancef  2/15. Zosyn 2/24>>3/3Vanc 2/25>>3/3 Foley:None currently.Voiding  Dispo:Wound stable, continue dressing change.Abdominal binder when sitting up or OOB.Corporate investment banker.Await inpatient psych placement  Eye drops and antifungal powder ordered. EKG pending for qtc monitoring   LOS: 36 days    Adam Phenix , [facility] 01/06/2021, 9:59 AM Please see Amion for pager number during day hours 7:00am-4:30pm

## 2021-01-06 NOTE — Plan of Care (Signed)
  Problem: Education: Goal: Knowledge of General Education information will improve Description Including pain rating scale, medication(s)/side effects and non-pharmacologic comfort measures Outcome: Progressing   

## 2021-01-07 NOTE — Progress Notes (Signed)
Patient showered, dressing changed at 1500.

## 2021-01-07 NOTE — TOC Progression Note (Signed)
Transition of Care [facility]) - Progression Note    Patient Details  Name: [name] MRN: [id number] Date of Birth: 24-Jul-1956  [facility] CM/SW Contact  Astrid Drafts Berna Spare, RN Phone Number: 01/07/2021, 5:09 PM  Clinical Narrative:    Sherron Monday with Deanna at [facility] regional hospital intake; patient remains on waiting list for geriatric psych bed.  Will continue to provide updates as they are available.   Expected Discharge Plan: Psychiatric Hospital Barriers to Discharge: Continued Medical Work up  Expected Discharge Plan and Services Expected Discharge Plan: Psychiatric Hospital   Discharge Planning Services: CM Consult   Living arrangements for the past 2 months: Homeless                                       Social Determinants of Health (SDOH) Interventions    Readmission Risk Interventions No flowsheet data found.  Quintella Baton, RN, BSN  Trauma/Neuro ICU Case Manager [phone]

## 2021-01-07 NOTE — Progress Notes (Signed)
37 Days Post-Op  Subjective: CC: NAEO. Resting comfortably.  Objective: Vital signs in last 24 hours: Temp:  [98.3 F (36.8 C)-98.4 F (36.9 C)] 98.4 F (36.9 C) (03/24 0623) Pulse Rate:  [71-78] 71 (03/24 0623) Resp:  [18] 18 (03/24 0623) BP: (164-184)/(76-82) 164/76 (03/24 0623) SpO2:  [97 %] 97 % (03/24 0623) Last BM Date: 01/06/21  Intake/Output from previous day: 03/23 0701 - 03/24 0700 In: 1320 [P.O.:1320] Out: -  Intake/Output this shift: No intake/output data recorded.  PE: Gen: alseep, NAD Heart: Reg Pulm: CTA b/l, rate and effort normal Abd: Soft,protuberant, nontender, dressing c/d/i. No cellulitis. Msk: No LE edema  Lab Results:  CMP     Component Value Date/Time   NA 136 12/21/2020 0213   K 4.0 12/21/2020 0213   CL 104 12/21/2020 0213   CO2 23 12/21/2020 0213   GLUCOSE 103 (H) 12/21/2020 0213   BUN 7 (L) 12/21/2020 0213   CREATININE 0.96 12/21/2020 0213   CALCIUM 8.9 12/21/2020 0213   PROT 6.7 12/14/2020 0647   ALBUMIN 2.1 (L) 12/14/2020 0647   AST 25 12/14/2020 0647   ALT 19 12/14/2020 0647   ALKPHOS 40 12/14/2020 0647   BILITOT 0.7 12/14/2020 0647   GFRNONAA >60 12/21/2020 0213   Lipase  No results found for: LIPASE     Studies/Results: No results found.  Anti-infectives: Anti-infectives (From admission, onward)   Start     Dose/Rate Route Frequency Ordered Stop   12/13/20 1800  vancomycin (VANCOREADY) IVPB 1000 mg/200 mL        1,000 mg 200 mL/hr over 60 Minutes Intravenous Every 12 hours 12/13/20 0733 12/17/20 1945   12/11/20 1300  vancomycin (VANCOREADY) IVPB 1250 mg/250 mL  Status:  Discontinued        1,250 mg 166.7 mL/hr over 90 Minutes Intravenous Every 12 hours 12/11/20 1230 12/13/20 0733   12/10/20 0900  piperacillin-tazobactam (ZOSYN) IVPB 3.375 g  Status:  Discontinued        3.375 g 100 mL/hr over 30 Minutes Intravenous Every 8 hours 12/10/20 0808 12/10/20 0811   12/10/20 0900  piperacillin-tazobactam (ZOSYN)  IVPB 3.375 g        3.375 g 12.5 mL/hr over 240 Minutes Intravenous Every 8 hours 12/10/20 0811 12/17/20 2359   12/01/20 1900  ceFAZolin (ANCEF) 3 g in dextrose 5 % 50 mL IVPB        3 g 100 mL/hr over 30 Minutes Intravenous  Once 12/01/20 1859 12/01/20 1935   12/01/20 1900  metroNIDAZOLE (FLAGYL) IVPB 500 mg        500 mg 100 mL/hr over 60 Minutes Intravenous  Once 12/01/20 1859 12/01/20 2008       Assessment/Plan 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 Dossie Der - POD#36 -Soft diet - CT A/P 2/24 with no acute findings, but the patient hadabdominal wall cellulitis around his wound and extending out. Hewasstarted on zosyn 2/24, vanc added 2/25. No elevation of WBC, spiked fevers 2/24 but these have resolved now. (last fever 2/25 PM 100.4). Cellulitis improved.He completed7 days of zosyn/vanc then stop, 3/3.WBC remains WNL(checked on 3/5)and he is afebrile off abx currently -continue to mobilize - pulm toilet  -JP drain removed on POD 9  ABL anemia- stable on last labs Hx of EtOH abuse Hx of Hep Cs/p treatment Liver Cirrhosis-Ammonia 41 (3/11), check PRN, continue lactulose to 60g HTN- prn meds Hx ofbipolar and schizophreniaw/  multiple previous suicide attempts- psych saw 3/18 and continue to recommend inpatient psych. They made med adjustments to Elavil, Abilify and haldol. EKG 3/18 QTc 410.Continue 1:1 sitter.  JHE:RDEY diet, bowel regimen VTE: lovenox ID: flagyl/ancef 2/15. Zosyn 2/24>>3/3Vanc 2/25>>3/3 Foley:None currently.Voiding  Dispo:Wound stable, continue dressing change.Abdominal binder when sitting up or OOB.Corporate investment banker.Await inpatient psych placement   EKG pending, ordered 01/06/21, for qtc monitoring   LOS: 37 days    Adam Phenix , [facility] 01/07/2021, 7:38 AM Please see Amion for pager number during day  hours 7:00am-4:30pm

## 2021-01-08 NOTE — Progress Notes (Signed)
38 Days Post-Op  Subjective: CC: No acute changes overnight. Pleasant this AM. Asking for reg diet.  Objective: Vital signs in last 24 hours: Temp:  [98.2 F (36.8 C)-98.4 F (36.9 C)] 98.2 F (36.8 C) (03/25 0628) Pulse Rate:  [68-74] 70 (03/25 0628) Resp:  [18-20] 18 (03/25 0628) BP: (155-160)/(68-89) 155/68 (03/25 0628) SpO2:  [98 %] 98 % (03/25 0628) Last BM Date: 01/07/21  Intake/Output from previous day: 03/24 0701 - 03/25 0700 In: 1080 [P.O.:1080] Out: -  Intake/Output this shift: No intake/output data recorded.  PE: Gen: Alert, NAD Heart: Reg Pulm: CTA b/l, rate and effort normal Abd: Soft,protuberant, nontender,midline incision - w running sutures in place,small amount of inferior wound open and clean (~90% granulation tissue, ~10% fibrinous exudate). There is a small opening in the wound proximal to known area of dehiscence - it is <1cm in size - no drainage. No cellulitis. Msk: No LE edema  Lab Results:  CMP     Component Value Date/Time   NA 136 12/21/2020 0213   K 4.0 12/21/2020 0213   CL 104 12/21/2020 0213   CO2 23 12/21/2020 0213   GLUCOSE 103 (H) 12/21/2020 0213   BUN 7 (L) 12/21/2020 0213   CREATININE 0.96 12/21/2020 0213   CALCIUM 8.9 12/21/2020 0213   PROT 6.7 12/14/2020 0647   ALBUMIN 2.1 (L) 12/14/2020 0647   AST 25 12/14/2020 0647   ALT 19 12/14/2020 0647   ALKPHOS 40 12/14/2020 0647   BILITOT 0.7 12/14/2020 0647   GFRNONAA >60 12/21/2020 0213   Lipase  No results found for: LIPASE     Studies/Results: No results found.  Anti-infectives: Anti-infectives (From admission, onward)   Start     Dose/Rate Route Frequency Ordered Stop   12/13/20 1800  vancomycin (VANCOREADY) IVPB 1000 mg/200 mL        1,000 mg 200 mL/hr over 60 Minutes Intravenous Every 12 hours 12/13/20 0733 12/17/20 1945   12/11/20 1300  vancomycin (VANCOREADY) IVPB 1250 mg/250 mL  Status:  Discontinued        1,250 mg 166.7 mL/hr over 90 Minutes  Intravenous Every 12 hours 12/11/20 1230 12/13/20 0733   12/10/20 0900  piperacillin-tazobactam (ZOSYN) IVPB 3.375 g  Status:  Discontinued        3.375 g 100 mL/hr over 30 Minutes Intravenous Every 8 hours 12/10/20 0808 12/10/20 0811   12/10/20 0900  piperacillin-tazobactam (ZOSYN) IVPB 3.375 g        3.375 g 12.5 mL/hr over 240 Minutes Intravenous Every 8 hours 12/10/20 0811 12/17/20 2359   12/01/20 1900  ceFAZolin (ANCEF) 3 g in dextrose 5 % 50 mL IVPB        3 g 100 mL/hr over 30 Minutes Intravenous  Once 12/01/20 1859 12/01/20 1935   12/01/20 1900  metroNIDAZOLE (FLAGYL) IVPB 500 mg        500 mg 100 mL/hr over 60 Minutes Intravenous  Once 12/01/20 1859 12/01/20 2008       Assessment/Plan 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 Dossie Der - POD#37 -Soft diet - CT A/P 2/24 with no acute findings, but the patient hadabdominal wall cellulitis around his wound and extending out. Hewasstarted on zosyn 2/24, vanc added 2/25. No elevation of WBC, spiked fevers 2/24 but these have resolved now. (last fever 2/25 PM 100.4). Cellulitis improved.He completed7 days of zosyn/vanc then stop, 3/3.WBC remains WNL(checked on 3/5)and he is afebrile  off abx currently -continue to mobilize - pulm toilet  -JP drain removed on POD 9  ABL anemia- stable on last labs (3/5) Hx of EtOH abuse Hx of Hep Cs/p treatment Liver Cirrhosis-Ammonia 41 (3/11), check PRN, continue lactulose to 60g HTN- prn meds Hx ofbipolar and schizophreniaw/ multiple previous suicide attempts- psych saw 3/18 and continue to recommend inpatient psych. They made med adjustments to Elavil, Abilify and haldol. EKG 3/16 QTc 410.Continue 1:1 sitter.  BDZ:HGDJ diet, bowel regimen; advance to regular VTE: lovenox ID: flagyl/ancef 2/15. Zosyn 2/24>>3/3Vanc 2/25>>3/3 Foley:None currently.Voiding  Dispo:Wound stable,  continue dressing change.Abdominal binder when sitting up or OOB.Corporate investment banker.Await inpatient psych placement   EKG pending for qtc monitoring   LOS: 38 days    [name] , [facility] Surgery 01/08/2021, 9:17 AM Please see Amion for pager number during day hours 7:00am-4:30pm

## 2021-01-09 NOTE — Progress Notes (Signed)
Pt went on a walk around the unit 3x's. Environmental services was cleaning pt's room and pt wanted to walk around. Pt grabbed a newspaper, remained calm and cooperative. Will continue to monitor pt.

## 2021-01-09 NOTE — Progress Notes (Signed)
Patient ID: [name], male   DOB: 1956-02-04, 65 y.o.   MRN: [id number] [facility] Surgery Progress Note:   39 Days Post-Op  Subjective: Mental status is awake.  Complaints many-large abdominal wall hernias. Objective: Vital signs in last 24 hours: Temp:  [97.9 F (36.6 C)-98.2 F (36.8 C)] 97.9 F (36.6 C) (03/26 0500) Pulse Rate:  [64-67] 64 (03/26 0500) Resp:  [16-18] 17 (03/26 0500) BP: (131-154)/(54-76) 136/58 (03/26 0500) SpO2:  [96 %-99 %] 96 % (03/26 0500)  Intake/Output from previous day: 03/25 0701 - 03/26 0700 In: 1340 [P.O.:1340] Out: -  Intake/Output this shift: No intake/output data recorded.  Physical Exam: Work of breathing is not labored;  Wound with some scan drainage and scabbing.    Lab Results:  No results found for this or any previous visit (from the past 48 hour(s)).  Radiology/Results: No results found.  Anti-infectives: Anti-infectives (From admission, onward)   Start     Dose/Rate Route Frequency Ordered Stop   12/13/20 1800  vancomycin (VANCOREADY) IVPB 1000 mg/200 mL        1,000 mg 200 mL/hr over 60 Minutes Intravenous Every 12 hours 12/13/20 0733 12/17/20 1945   12/11/20 1300  vancomycin (VANCOREADY) IVPB 1250 mg/250 mL  Status:  Discontinued        1,250 mg 166.7 mL/hr over 90 Minutes Intravenous Every 12 hours 12/11/20 1230 12/13/20 0733   12/10/20 0900  piperacillin-tazobactam (ZOSYN) IVPB 3.375 g  Status:  Discontinued        3.375 g 100 mL/hr over 30 Minutes Intravenous Every 8 hours 12/10/20 0808 12/10/20 0811   12/10/20 0900  piperacillin-tazobactam (ZOSYN) IVPB 3.375 g        3.375 g 12.5 mL/hr over 240 Minutes Intravenous Every 8 hours 12/10/20 0811 12/17/20 2359   12/01/20 1900  ceFAZolin (ANCEF) 3 g in dextrose 5 % 50 mL IVPB        3 g 100 mL/hr over 30 Minutes Intravenous  Once 12/01/20 1859 12/01/20 1935   12/01/20 1900  metroNIDAZOLE (FLAGYL) IVPB 500 mg        500 mg 100 mL/hr over 60 Minutes Intravenous   Once 12/01/20 1859 12/01/20 2008      Assessment/Plan: Problem List: Patient Active Problem List   Diagnosis Date Noted  . Recurrent ventral incisional hernia 12/06/2020  . Suicidal behavior with REPEATED self-injury (HCC) 12/05/2020  . Suicide and self-inflicted injury (HCC) 12/05/2020  . Self inflicted stab of small intestine s/p repair 12/01/2020 12/05/2020  . Obesity (BMI 30-39.9) 12/05/2020  . Constipation, chronic 12/05/2020  . Liver cirrhosis (HCC)   . Alcohol abuse   . Status post evisceration 12/01/2020    Suicide precautions in place.  No acute surgical issues.  39 Days Post-Op    LOS: 39 days   Matt B. Daphine Deutscher, MD, [facility] [phone] to reach the surgeon on call.    01/09/2021 8:58 AM

## 2021-01-10 NOTE — Progress Notes (Signed)
Patient ID: [name], male   DOB: 1956/09/22, 65 y.o.   MRN: [id number] [facility] Surgery Progress Note:   40 Days Post-Op  Subjective: Mental status is unchanged.  Complaints -many but none that I can fix today. Objective: Vital signs in last 24 hours: Temp:  [97.7 F (36.5 C)-98 F (36.7 C)] 97.8 F (36.6 C) (03/27 0618) Pulse Rate:  [62-74] 74 (03/27 0618) Resp:  [17-18] 18 (03/27 0618) BP: (140-170)/(66-80) 170/80 (03/27 0618) SpO2:  [97 %-98 %] 97 % (03/27 0618)  Intake/Output from previous day: 03/26 0701 - 03/27 0700 In: 780 [P.O.:780] Out: -  Intake/Output this shift: No intake/output data recorded.  Physical Exam: Work of breathing is not labored.  Incision has some scabs and scant drainage.  Ventral herniae are huge  Lab Results:  No results found for this or any previous visit (from the past 48 hour(s)).  Radiology/Results: No results found.  Anti-infectives: Anti-infectives (From admission, onward)   Start     Dose/Rate Route Frequency Ordered Stop   12/13/20 1800  vancomycin (VANCOREADY) IVPB 1000 mg/200 mL        1,000 mg 200 mL/hr over 60 Minutes Intravenous Every 12 hours 12/13/20 0733 12/17/20 1945   12/11/20 1300  vancomycin (VANCOREADY) IVPB 1250 mg/250 mL  Status:  Discontinued        1,250 mg 166.7 mL/hr over 90 Minutes Intravenous Every 12 hours 12/11/20 1230 12/13/20 0733   12/10/20 0900  piperacillin-tazobactam (ZOSYN) IVPB 3.375 g  Status:  Discontinued        3.375 g 100 mL/hr over 30 Minutes Intravenous Every 8 hours 12/10/20 0808 12/10/20 0811   12/10/20 0900  piperacillin-tazobactam (ZOSYN) IVPB 3.375 g        3.375 g 12.5 mL/hr over 240 Minutes Intravenous Every 8 hours 12/10/20 0811 12/17/20 2359   12/01/20 1900  ceFAZolin (ANCEF) 3 g in dextrose 5 % 50 mL IVPB        3 g 100 mL/hr over 30 Minutes Intravenous  Once 12/01/20 1859 12/01/20 1935   12/01/20 1900  metroNIDAZOLE (FLAGYL) IVPB 500 mg        500 mg 100 mL/hr over  60 Minutes Intravenous  Once 12/01/20 1859 12/01/20 2008      Assessment/Plan: Problem List: Patient Active Problem List   Diagnosis Date Noted  . Recurrent ventral incisional hernia 12/06/2020  . Suicidal behavior with REPEATED self-injury (HCC) 12/05/2020  . Suicide and self-inflicted injury (HCC) 12/05/2020  . Self inflicted stab of small intestine s/p repair 12/01/2020 12/05/2020  . Obesity (BMI 30-39.9) 12/05/2020  . Constipation, chronic 12/05/2020  . Liver cirrhosis (HCC)   . Alcohol abuse   . Status post evisceration 12/01/2020    Awaiting placement at [facility].   40 Days Post-Op    LOS: 40 days   Matt B. Daphine Deutscher, MD, [facility] [phone] to reach the surgeon on call.    01/10/2021 9:41 AM

## 2021-01-11 NOTE — Progress Notes (Signed)
41 Days Post-Op  Subjective: CC: No BM yesterday.  Fairly irritated with [place] that we aren't fixing his abdominal pain.  He is fixated on getting an MRI of his abdomen despite me telling him that's not necessary as he has had CT scans.  Objective: Vital signs in last 24 hours: Temp:  [98.1 F (36.7 C)-98.2 F (36.8 C)] 98.2 F (36.8 C) (03/28 0607) Pulse Rate:  [58-86] 58 (03/28 0607) Resp:  [17-18] 17 (03/28 0607) BP: (146-171)/(65-77) 146/65 (03/28 0607) SpO2:  [97 %] 97 % (03/28 0607) Last BM Date: 01/08/21  Intake/Output from previous day: 03/27 0701 - 03/28 0700 In: 1215 [P.O.:1215] Out: -  Intake/Output this shift: No intake/output data recorded.  PE: Gen: Alert, NAD Heart: Reg Pulm: CTA b/l, rate and effort normal Abd: Soft,protuberant, nontender,midline incision - w running sutures in place,small amount of inferior wound open and clean (~90% granulation tissue, ~10% fibrinous exudate). There is a small opening in the wound proximal to known area of dehiscence - it is <1cm in size - no drainage. No cellulitis. Msk: No LE edema  Lab Results:  CMP     Component Value Date/Time   NA 136 12/21/2020 0213   K 4.0 12/21/2020 0213   CL 104 12/21/2020 0213   CO2 23 12/21/2020 0213   GLUCOSE 103 (H) 12/21/2020 0213   BUN 7 (L) 12/21/2020 0213   CREATININE 0.96 12/21/2020 0213   CALCIUM 8.9 12/21/2020 0213   PROT 6.7 12/14/2020 0647   ALBUMIN 2.1 (L) 12/14/2020 0647   AST 25 12/14/2020 0647   ALT 19 12/14/2020 0647   ALKPHOS 40 12/14/2020 0647   BILITOT 0.7 12/14/2020 0647   GFRNONAA >60 12/21/2020 0213   Lipase  No results found for: LIPASE     Studies/Results: No results found.  Anti-infectives: Anti-infectives (From admission, onward)   Start     Dose/Rate Route Frequency Ordered Stop   12/13/20 1800  vancomycin (VANCOREADY) IVPB 1000 mg/200 mL        1,000 mg 200 mL/hr over 60 Minutes Intravenous Every 12 hours 12/13/20 0733 12/17/20 1945    12/11/20 1300  vancomycin (VANCOREADY) IVPB 1250 mg/250 mL  Status:  Discontinued        1,250 mg 166.7 mL/hr over 90 Minutes Intravenous Every 12 hours 12/11/20 1230 12/13/20 0733   12/10/20 0900  piperacillin-tazobactam (ZOSYN) IVPB 3.375 g  Status:  Discontinued        3.375 g 100 mL/hr over 30 Minutes Intravenous Every 8 hours 12/10/20 0808 12/10/20 0811   12/10/20 0900  piperacillin-tazobactam (ZOSYN) IVPB 3.375 g        3.375 g 12.5 mL/hr over 240 Minutes Intravenous Every 8 hours 12/10/20 0811 12/17/20 2359   12/01/20 1900  ceFAZolin (ANCEF) 3 g in dextrose 5 % 50 mL IVPB        3 g 100 mL/hr over 30 Minutes Intravenous  Once 12/01/20 1859 12/01/20 1935   12/01/20 1900  metroNIDAZOLE (FLAGYL) IVPB 500 mg        500 mg 100 mL/hr over 60 Minutes Intravenous  Once 12/01/20 1859 12/01/20 2008       Assessment/Plan 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 Dossie Der - POD#41 -Soft diet - CT A/P 2/24 with no acute findings, but the patient hadabdominal wall cellulitis around his wound and extending out. Hewasstarted on zosyn 2/24, vanc added 2/25. No elevation of WBC, spiked  fevers 2/24 but these have resolved now. (last fever 2/25 PM 100.4). Cellulitis improved.He completed7 days of zosyn/vanc then stop, 3/3.WBC remains WNL(checked on 3/5)and he is afebrile off abx currently -continue to mobilize - pulm toilet  -JP drain removed on POD 9  ABL anemia- stable on last labs (3/5) Hx of EtOH abuse Hx of Hep Cs/p treatment Liver Cirrhosis-Ammonia 41 (3/11), check PRN, continue lactulose to 60g HTN- prn meds Hx ofbipolar and schizophreniaw/ multiple previous suicide attempts- psych saw 3/18 and continue to recommend inpatient psych. They made med adjustments to Elavil, Abilify and haldol. EKG 3/25 QTc 394.Continue 1:1 sitter.  YHC:WCBJ diet, bowel regimen; advance to  regular VTE: lovenox ID: flagyl/ancef 2/15. Zosyn 2/24>>3/3Vanc 2/25>>3/3 Foley:None currently.Voiding  Dispo:Wound stable, continue dressing change.Abdominal binder when sitting up or OOB.Corporate investment banker.Await inpatient psych placement   LOS: 41 days    Letha Cape , [facility] Surgery 01/11/2021, 8:15 AM Please see Amion for pager number during day hours 7:00am-4:30pm

## 2021-01-11 NOTE — Progress Notes (Signed)
Shyrl Numbers, DNP for pt. He would like to talk to her about his "babysitters." She said she would stop by tomorrow.

## 2021-01-12 NOTE — Progress Notes (Signed)
42 Days Post-Op  Subjective: CC: + BM.  No new complaints otherwise.  Wants to go outside  Objective: Vital signs in last 24 hours: Temp:  [97.9 F (36.6 C)] 97.9 F (36.6 C) (03/28 1200) Pulse Rate:  [80] 80 (03/28 1200) Resp:  [19] 19 (03/28 1200) BP: (150)/(68) 150/68 (03/28 1200) SpO2:  [97 %] 97 % (03/28 1200) Last BM Date: 01/08/21  Intake/Output from previous day: 03/28 0701 - 03/29 0700 In: 1420 [P.O.:1420] Out: -  Intake/Output this shift: No intake/output data recorded.  PE: Gen: Alert, NAD Heart: Reg Pulm: CTA b/l, rate and effort normal Abd: Soft,protuberant, nontender,midline incision - w running sutures in place,small amount of inferior wound open and clean (~90% granulation tissue, ~10% fibrinous exudate). There is a small opening in the wound proximal to known area of dehiscence - it is <1cm in size - no drainage. No cellulitis. Msk: No LE edema  Lab Results:  CMP     Component Value Date/Time   NA 136 12/21/2020 0213   K 4.0 12/21/2020 0213   CL 104 12/21/2020 0213   CO2 23 12/21/2020 0213   GLUCOSE 103 (H) 12/21/2020 0213   BUN 7 (L) 12/21/2020 0213   CREATININE 0.96 12/21/2020 0213   CALCIUM 8.9 12/21/2020 0213   PROT 6.7 12/14/2020 0647   ALBUMIN 2.1 (L) 12/14/2020 0647   AST 25 12/14/2020 0647   ALT 19 12/14/2020 0647   ALKPHOS 40 12/14/2020 0647   BILITOT 0.7 12/14/2020 0647   GFRNONAA >60 12/21/2020 0213   Lipase  No results found for: LIPASE     Studies/Results: No results found.  Anti-infectives: Anti-infectives (From admission, onward)   Start     Dose/Rate Route Frequency Ordered Stop   12/13/20 1800  vancomycin (VANCOREADY) IVPB 1000 mg/200 mL        1,000 mg 200 mL/hr over 60 Minutes Intravenous Every 12 hours 12/13/20 0733 12/17/20 1945   12/11/20 1300  vancomycin (VANCOREADY) IVPB 1250 mg/250 mL  Status:  Discontinued        1,250 mg 166.7 mL/hr over 90 Minutes Intravenous Every 12 hours 12/11/20 1230  12/13/20 0733   12/10/20 0900  piperacillin-tazobactam (ZOSYN) IVPB 3.375 g  Status:  Discontinued        3.375 g 100 mL/hr over 30 Minutes Intravenous Every 8 hours 12/10/20 0808 12/10/20 0811   12/10/20 0900  piperacillin-tazobactam (ZOSYN) IVPB 3.375 g        3.375 g 12.5 mL/hr over 240 Minutes Intravenous Every 8 hours 12/10/20 0811 12/17/20 2359   12/01/20 1900  ceFAZolin (ANCEF) 3 g in dextrose 5 % 50 mL IVPB        3 g 100 mL/hr over 30 Minutes Intravenous  Once 12/01/20 1859 12/01/20 1935   12/01/20 1900  metroNIDAZOLE (FLAGYL) IVPB 500 mg        500 mg 100 mL/hr over 60 Minutes Intravenous  Once 12/01/20 1859 12/01/20 2008       Assessment/Plan 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 Dossie Der - POD#42 -Soft diet - CT A/P 2/24 with no acute findings, but the patient hadabdominal wall cellulitis around his wound and extending out. Hewasstarted on zosyn 2/24, vanc added 2/25. No elevation of WBC, spiked fevers 2/24 but these have resolved now. (last fever 2/25 PM 100.4). Cellulitis improved.He completed7 days of zosyn/vanc then stop, 3/3.WBC remains WNL(checked on 3/5)and he is afebrile off abx  currently -continue to mobilize - pulm toilet  -JP drain removed on POD 9  ABL anemia- stable on last labs (3/5) Hx of EtOH abuse Hx of Hep Cs/p treatment Liver Cirrhosis-Ammonia 41 (3/11), check PRN, continue lactulose to 60g HTN- prn meds Hx ofbipolar and schizophreniaw/ multiple previous suicide attempts- psych saw 3/18 and continue to recommend inpatient psych. They made med adjustments to Elavil, Abilify and haldol. EKG 3/25 QTc 394.Continue 1:1 sitter.  TKW:IOXB diet, bowel regimen; advance to regular VTE: lovenox ID: flagyl/ancef 2/15. Zosyn 2/24>>3/3Vanc 2/25>>3/3 Foley:None currently.Voiding  Dispo:Wound stable, continue dressing change.Abdominal  binder when sitting up or OOB.Corporate investment banker.Await inpatient psych placement   LOS: 42 days    Letha Cape , [facility] 01/12/2021, 9:53 AM Please see Amion for pager number during day hours 7:00am-4:30pm

## 2021-01-12 NOTE — Progress Notes (Signed)
[place] officer wants to speak to social worker please  email provided by officer is [email or web address] phone number provided is [phone]. Officer came by and spoke with patient, and needs an update.

## 2021-01-13 NOTE — TOC Progression Note (Signed)
Transition of Care [facility]) - Progression Note    Patient Details  Name: [name] MRN: [id number] Date of Birth: 24-Apr-1956  Transition of Care [facility]) CM/SW Contact  Astrid Drafts Berna Spare, RN Phone Number: 01/13/2021, 2:50 PM  Clinical Narrative:   Per [facility] intake, patient remains on the waiting list for geriatric psych bed.  Will continue to follow with updates as they are available.     Expected Discharge Plan: Psychiatric Hospital Barriers to Discharge: Continued Medical Work up  Expected Discharge Plan and Services Expected Discharge Plan: Psychiatric Hospital   Discharge Planning Services: CM Consult   Living arrangements for the past 2 months: Homeless                                       Social Determinants of Health (SDOH) Interventions    Readmission Risk Interventions No flowsheet data found.  Quintella Baton, RN, BSN  Trauma/Neuro ICU Case Manager [phone]

## 2021-01-13 NOTE — Progress Notes (Signed)
43 Days Post-Op  Subjective: CC: Wants an MRI, no new changes  Objective: Vital signs in last 24 hours: Temp:  [97.6 F (36.4 C)-98.3 F (36.8 C)] 97.6 F (36.4 C) (03/30 0612) Pulse Rate:  [69-89] 89 (03/30 0612) Resp:  [16-20] 20 (03/30 0612) BP: (150-158)/(63-76) 150/72 (03/30 0612) SpO2:  [98 %] 98 % (03/30 0612) Last BM Date: 01/08/21  Intake/Output from previous day: 03/29 0701 - 03/30 0700 In: 1080 [P.O.:1080] Out: 2 [Stool:2] Intake/Output this shift: No intake/output data recorded.  PE: Gen: Alert, NAD Heart: Reg Pulm: CTA b/l, rate and effort normal Abd: Soft,protuberant, nontender,midline incision stable with open wound mostly clean and packed. Msk: No LE edema  Lab Results:  CMP     Component Value Date/Time   NA 136 12/21/2020 0213   K 4.0 12/21/2020 0213   CL 104 12/21/2020 0213   CO2 23 12/21/2020 0213   GLUCOSE 103 (H) 12/21/2020 0213   BUN 7 (L) 12/21/2020 0213   CREATININE 0.96 12/21/2020 0213   CALCIUM 8.9 12/21/2020 0213   PROT 6.7 12/14/2020 0647   ALBUMIN 2.1 (L) 12/14/2020 0647   AST 25 12/14/2020 0647   ALT 19 12/14/2020 0647   ALKPHOS 40 12/14/2020 0647   BILITOT 0.7 12/14/2020 0647   GFRNONAA >60 12/21/2020 0213   Lipase  No results found for: LIPASE     Studies/Results: No results found.  Anti-infectives: Anti-infectives (From admission, onward)   Start     Dose/Rate Route Frequency Ordered Stop   12/13/20 1800  vancomycin (VANCOREADY) IVPB 1000 mg/200 mL        1,000 mg 200 mL/hr over 60 Minutes Intravenous Every 12 hours 12/13/20 0733 12/17/20 1945   12/11/20 1300  vancomycin (VANCOREADY) IVPB 1250 mg/250 mL  Status:  Discontinued        1,250 mg 166.7 mL/hr over 90 Minutes Intravenous Every 12 hours 12/11/20 1230 12/13/20 0733   12/10/20 0900  piperacillin-tazobactam (ZOSYN) IVPB 3.375 g  Status:  Discontinued        3.375 g 100 mL/hr over 30 Minutes Intravenous Every 8 hours 12/10/20 0808 12/10/20 0811    12/10/20 0900  piperacillin-tazobactam (ZOSYN) IVPB 3.375 g        3.375 g 12.5 mL/hr over 240 Minutes Intravenous Every 8 hours 12/10/20 0811 12/17/20 2359   12/01/20 1900  ceFAZolin (ANCEF) 3 g in dextrose 5 % 50 mL IVPB        3 g 100 mL/hr over 30 Minutes Intravenous  Once 12/01/20 1859 12/01/20 1935   12/01/20 1900  metroNIDAZOLE (FLAGYL) IVPB 500 mg        500 mg 100 mL/hr over 60 Minutes Intravenous  Once 12/01/20 1859 12/01/20 2008       Assessment/Plan 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 Dossie Der - POD#43 -Soft diet - CT A/P 2/24 with no acute findings, but the patient hadabdominal wall cellulitis around his wound and extending out. Hewasstarted on zosyn 2/24, vanc added 2/25. No elevation of WBC, spiked fevers 2/24 but these have resolved now. (last fever 2/25 PM 100.4). Cellulitis improved.He completed7 days of zosyn/vanc then stop, 3/3.WBC remains WNL(checked on 3/5)and he is afebrile off abx currently -continue to mobilize - pulm toilet  -JP drain removed on POD 9  ABL anemia- stable on last labs (3/5) Hx of EtOH abuse Hx of Hep Cs/p treatment Liver Cirrhosis-Ammonia 41 (3/11), check PRN, continue lactulose  to 60g HTN- prn meds Hx ofbipolar and schizophreniaw/ multiple previous suicide attempts- psych saw 3/18 and continue to recommend inpatient psych. They made med adjustments to Elavil, Abilify and haldol. EKG 3/25 QTc 394.Continue 1:1 sitter.  YFR:TMYTRZN diet, lactulose VTE: lovenox ID: flagyl/ancef 2/15. Zosyn 2/24>>3/3Vanc 2/25>>3/3 Foley:None currently.Voiding  Dispo:Wound stable, continue dressing change.Abdominal binder when sitting up or OOB.Corporate investment banker.Await inpatient psych placement   LOS: 43 days    Letha Cape , [facility] 01/13/2021, 9:17 AM Please see Amion for pager number during day hours  7:00am-4:30pm

## 2021-01-13 NOTE — Progress Notes (Signed)
Pt enjoyed 30 minutes of outside time today with Recruitment consultant and security. He was well behaved per Recruitment consultant.  Pt would like to know when a bed might be available for him to be able to go to a mental health facility for help.

## 2021-01-14 NOTE — TOC Progression Note (Incomplete)
Transition of Care [facility]) - Progression Note    Patient Details  Name: PARSA RICKETT MRN: [id number] Date of Birth: Feb 03, 1956  Transition of Care [facility]) CM/SW Contact  Astrid Drafts Berna Spare, RN Phone Number: 01/14/2021, 4:37 PM  Clinical Narrative: Received call from Deanna at [facility] regional hospital intake    Expected Discharge Plan: Psychiatric Hospital Barriers to Discharge: Continued Medical Work up  Expected Discharge Plan and Services Expected Discharge Plan: Psychiatric Hospital   Discharge Planning Services: CM Consult   Living arrangements for the past 2 months: Homeless                                       Social Determinants of Health (SDOH) Interventions    Readmission Risk Interventions No flowsheet data found.

## 2021-01-14 NOTE — Progress Notes (Signed)
44 Days Post-Op  Subjective: CC: Did well going outside for 30 minutes yesterday.  Very much so enjoyed that.  Trying to sleep today  Objective: Vital signs in last 24 hours: Temp:  [97.9 F (36.6 C)-98.4 F (36.9 C)] 98.1 F (36.7 C) (03/31 0548) Pulse Rate:  [66-71] 71 (03/31 0548) Resp:  [19-20] 20 (03/31 0548) BP: (151-160)/(68-77) 156/72 (03/31 0548) SpO2:  [97 %-98 %] 97 % (03/31 0548) Last BM Date: 01/08/21  Intake/Output from previous day: 03/30 0701 - 03/31 0700 In: 600 [P.O.:600] Out: -  Intake/Output this shift: No intake/output data recorded.  PE: Gen: Alert, NAD Heart: Reg Pulm: CTA b/l, rate and effort normal Abd: Soft,protuberant, nontender,midline incision stable with open wound  clean and packed. Msk: No LE edema  Lab Results:  CMP     Component Value Date/Time   NA 136 12/21/2020 0213   K 4.0 12/21/2020 0213   CL 104 12/21/2020 0213   CO2 23 12/21/2020 0213   GLUCOSE 103 (H) 12/21/2020 0213   BUN 7 (L) 12/21/2020 0213   CREATININE 0.96 12/21/2020 0213   CALCIUM 8.9 12/21/2020 0213   PROT 6.7 12/14/2020 0647   ALBUMIN 2.1 (L) 12/14/2020 0647   AST 25 12/14/2020 0647   ALT 19 12/14/2020 0647   ALKPHOS 40 12/14/2020 0647   BILITOT 0.7 12/14/2020 0647   GFRNONAA >60 12/21/2020 0213   Lipase  No results found for: LIPASE     Studies/Results: No results found.  Anti-infectives: Anti-infectives (From admission, onward)   Start     Dose/Rate Route Frequency Ordered Stop   12/13/20 1800  vancomycin (VANCOREADY) IVPB 1000 mg/200 mL        1,000 mg 200 mL/hr over 60 Minutes Intravenous Every 12 hours 12/13/20 0733 12/17/20 1945   12/11/20 1300  vancomycin (VANCOREADY) IVPB 1250 mg/250 mL  Status:  Discontinued        1,250 mg 166.7 mL/hr over 90 Minutes Intravenous Every 12 hours 12/11/20 1230 12/13/20 0733   12/10/20 0900  piperacillin-tazobactam (ZOSYN) IVPB 3.375 g  Status:  Discontinued        3.375 g 100 mL/hr over 30  Minutes Intravenous Every 8 hours 12/10/20 0808 12/10/20 0811   12/10/20 0900  piperacillin-tazobactam (ZOSYN) IVPB 3.375 g        3.375 g 12.5 mL/hr over 240 Minutes Intravenous Every 8 hours 12/10/20 0811 12/17/20 2359   12/01/20 1900  ceFAZolin (ANCEF) 3 g in dextrose 5 % 50 mL IVPB        3 g 100 mL/hr over 30 Minutes Intravenous  Once 12/01/20 1859 12/01/20 1935   12/01/20 1900  metroNIDAZOLE (FLAGYL) IVPB 500 mg        500 mg 100 mL/hr over 60 Minutes Intravenous  Once 12/01/20 1859 12/01/20 2008       Assessment/Plan 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 Dossie Der - POD#44 -Soft diet - CT A/P 2/24 with no acute findings, but the patient hadabdominal wall cellulitis around his wound and extending out. Hewasstarted on zosyn 2/24, vanc added 2/25. No elevation of WBC, spiked fevers 2/24 but these have resolved now. (last fever 2/25 PM 100.4). Cellulitis improved.He completed7 days of zosyn/vanc then stop, 3/3.WBC remains WNL(checked on 3/5)and he is afebrile off abx currently -continue to mobilize - pulm toilet  -JP drain removed on POD 9  ABL anemia- stable on last labs (3/5) Hx of EtOH  abuse Hx of Hep Cs/p treatment Liver Cirrhosis-Ammonia 41 (3/11), check PRN, continue lactulose to 60g HTN- prn meds Hx ofbipolar and schizophreniaw/ multiple previous suicide attempts- psych saw 3/18 and continue to recommend inpatient psych. They made med adjustments to Elavil, Abilify and haldol. EKG 3/25 QTc 394.Continue 1:1 sitter.  QBH:ALPFXTK diet, lactulose VTE: lovenox ID: flagyl/ancef 2/15. Zosyn 2/24>>3/3Vanc 2/25>>3/3 Foley:None currently.Voiding  Dispo:Wound stable, continue dressing change.Abdominal binder when sitting up or OOB.Corporate investment banker.Await inpatient psych placement   LOS: 44 days    Letha Cape , [facility]  Surgery 01/14/2021, 8:32 AM Please see Amion for pager number during day hours 7:00am-4:30pm

## 2021-01-14 NOTE — Progress Notes (Signed)
Notified Kelly, [place], that pt stated he's been having sharp pains shoot across his chest every now and then lately.

## 2021-01-14 NOTE — Progress Notes (Signed)
Changed midline dressing. Tiny, shiny, red nodule about 4 cm above dehisced section, popped through sutured area.  New dressing applied. Moist to dry with ABD and tape.

## 2021-01-14 NOTE — Progress Notes (Signed)
Nursing Note: Spoke to [name] on yesterday who shared that he is concerned that he may not be able to go to [facility].  He stated that he doesn't want to be discharged just yet and if he is he will say that he wants to kill himself so he can stay. I listened and asked if he wanted to harm himself. He said no but he did not want to be discharged to just anywhere.

## 2021-01-15 NOTE — Progress Notes (Signed)
45 Days Post-Op  Subjective: CC: Lengthy discussion again regarding his "hernia".  Patient really wanting to go to [facility]. He also expresses suicidal ideations again and states he is in chronic pain and doesn't want to live.  He essentially asked me to increase his medications to knock his pain out so he doesn't have to feel anything.  He states he normally uses his liquor to do this for himself.  Objective: Vital signs in last 24 hours: Temp:  [98 F (36.7 C)-98.3 F (36.8 C)] 98.3 F (36.8 C) (03/31 2145) Pulse Rate:  [66-81] 66 (03/31 2145) Resp:  [18-19] 19 (03/31 2145) BP: (151-166)/(62-75) 151/62 (03/31 2145) SpO2:  [97 %-98 %] 98 % (03/31 2145) Last BM Date: 01/14/21  Intake/Output from previous day: 03/31 0701 - 04/01 0700 In: 960 [P.O.:960] Out: -  Intake/Output this shift: No intake/output data recorded.  PE: Gen: Alert, NAD Heart: Reg Pulm: CTA b/l, rate and effort normal Abd: Soft,protuberant, nontender,midline incision stable with open wound  clean and packed.  Small area of either hypergranulation tissue or some other tissue that has come through skin just above wound open.   Msk: No LE edema  Lab Results:  CMP     Component Value Date/Time   NA 136 12/21/2020 0213   K 4.0 12/21/2020 0213   CL 104 12/21/2020 0213   CO2 23 12/21/2020 0213   GLUCOSE 103 (H) 12/21/2020 0213   BUN 7 (L) 12/21/2020 0213   CREATININE 0.96 12/21/2020 0213   CALCIUM 8.9 12/21/2020 0213   PROT 6.7 12/14/2020 0647   ALBUMIN 2.1 (L) 12/14/2020 0647   AST 25 12/14/2020 0647   ALT 19 12/14/2020 0647   ALKPHOS 40 12/14/2020 0647   BILITOT 0.7 12/14/2020 0647   GFRNONAA >60 12/21/2020 0213   Lipase  No results found for: LIPASE     Studies/Results: No results found.  Anti-infectives: Anti-infectives (From admission, onward)   Start     Dose/Rate Route Frequency Ordered Stop   12/13/20 1800  vancomycin (VANCOREADY) IVPB 1000 mg/200 mL        1,000 mg 200 mL/hr  over 60 Minutes Intravenous Every 12 hours 12/13/20 0733 12/17/20 1945   12/11/20 1300  vancomycin (VANCOREADY) IVPB 1250 mg/250 mL  Status:  Discontinued        1,250 mg 166.7 mL/hr over 90 Minutes Intravenous Every 12 hours 12/11/20 1230 12/13/20 0733   12/10/20 0900  piperacillin-tazobactam (ZOSYN) IVPB 3.375 g  Status:  Discontinued        3.375 g 100 mL/hr over 30 Minutes Intravenous Every 8 hours 12/10/20 0808 12/10/20 0811   12/10/20 0900  piperacillin-tazobactam (ZOSYN) IVPB 3.375 g        3.375 g 12.5 mL/hr over 240 Minutes Intravenous Every 8 hours 12/10/20 0811 12/17/20 2359   12/01/20 1900  ceFAZolin (ANCEF) 3 g in dextrose 5 % 50 mL IVPB        3 g 100 mL/hr over 30 Minutes Intravenous  Once 12/01/20 1859 12/01/20 1935   12/01/20 1900  metroNIDAZOLE (FLAGYL) IVPB 500 mg        500 mg 100 mL/hr over 60 Minutes Intravenous  Once 12/01/20 1859 12/01/20 2008       Assessment/Plan 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 Dossie Der - POD#45 -Soft diet - CT A/P 2/24 with no acute findings, but the patient hadabdominal wall cellulitis around his wound and  extending out. Hewasstarted on zosyn 2/24, vanc added 2/25. No elevation of WBC, spiked fevers 2/24 but these have resolved now. (last fever 2/25 PM 100.4). Cellulitis improved.He completed7 days of zosyn/vanc then stop, 3/3.WBC remains WNL(checked on 3/5)and he is afebrile off abx currently -continue to mobilize - pulm toilet  -JP drain removed on POD 9  ABL anemia- stable on last labs (3/5) Hx of EtOH abuse Hx of Hep Cs/p treatment Liver Cirrhosis-Ammonia 41 (3/11), check PRN, continue lactulose to 60g HTN- prn meds Hx ofbipolar and schizophreniaw/ multiple previous suicide attempts- psych saw 3/18 and continue to recommend inpatient psych. They made med adjustments to Elavil, Abilify and haldol. EKG 3/25 QTc  394.Continue 1:1 sitter.  GEX:BMWUXLK diet, lactulose VTE: lovenox ID: flagyl/ancef 2/15. Zosyn 2/24>>3/3Vanc 2/25>>3/3 Foley:None currently.Voiding  Dispo:Wound stable, continue dressing change.Abdominal binder when sitting up or OOB.Corporate investment banker.Await inpatient psych placement   LOS: 45 days    Letha Cape , [facility] Surgery 01/15/2021, 8:43 AM Please see Amion for pager number during day hours 7:00am-4:30pm

## 2021-01-16 NOTE — Progress Notes (Signed)
46 Days Post-Op  Subjective: CC: No complaints  Objective: Vital signs in last 24 hours: Temp:  [98 F (36.7 C)-98.5 F (36.9 C)] 98.5 F (36.9 C) (04/02 0642) Pulse Rate:  [64-70] 66 (04/02 0642) Resp:  [18-20] 18 (04/02 0642) BP: (141-162)/(48-81) 156/81 (04/02 0642) SpO2:  [97 %-99 %] 97 % (04/02 0642) Last BM Date: 01/16/21  Intake/Output from previous day: 04/01 0701 - 04/02 0700 In: 1780 [P.O.:1780] Out: -  Intake/Output this shift: Total I/O In: 240 [P.O.:240] Out: -   PE: Gen: Alert, NAD Heart: Reg Pulm: CTA b/l, rate and effort normal Abd: Soft,protuberant, nontender,midline incision stable with open wound  clean and packed.  Small area of either hypergranulation tissue or some other tissue that has come through skin just above wound open.   Msk: No LE edema  Lab Results:  CMP     Component Value Date/Time   NA 136 12/21/2020 0213   K 4.0 12/21/2020 0213   CL 104 12/21/2020 0213   CO2 23 12/21/2020 0213   GLUCOSE 103 (H) 12/21/2020 0213   BUN 7 (L) 12/21/2020 0213   CREATININE 0.96 12/21/2020 0213   CALCIUM 8.9 12/21/2020 0213   PROT 6.7 12/14/2020 0647   ALBUMIN 2.1 (L) 12/14/2020 0647   AST 25 12/14/2020 0647   ALT 19 12/14/2020 0647   ALKPHOS 40 12/14/2020 0647   BILITOT 0.7 12/14/2020 0647   GFRNONAA >60 12/21/2020 0213   Lipase  No results found for: LIPASE     Studies/Results: No results found.  Anti-infectives: Anti-infectives (From admission, onward)   Start     Dose/Rate Route Frequency Ordered Stop   12/13/20 1800  vancomycin (VANCOREADY) IVPB 1000 mg/200 mL        1,000 mg 200 mL/hr over 60 Minutes Intravenous Every 12 hours 12/13/20 0733 12/17/20 1945   12/11/20 1300  vancomycin (VANCOREADY) IVPB 1250 mg/250 mL  Status:  Discontinued        1,250 mg 166.7 mL/hr over 90 Minutes Intravenous Every 12 hours 12/11/20 1230 12/13/20 0733   12/10/20 0900  piperacillin-tazobactam (ZOSYN) IVPB 3.375 g  Status:  Discontinued         3.375 g 100 mL/hr over 30 Minutes Intravenous Every 8 hours 12/10/20 0808 12/10/20 0811   12/10/20 0900  piperacillin-tazobactam (ZOSYN) IVPB 3.375 g        3.375 g 12.5 mL/hr over 240 Minutes Intravenous Every 8 hours 12/10/20 0811 12/17/20 2359   12/01/20 1900  ceFAZolin (ANCEF) 3 g in dextrose 5 % 50 mL IVPB        3 g 100 mL/hr over 30 Minutes Intravenous  Once 12/01/20 1859 12/01/20 1935   12/01/20 1900  metroNIDAZOLE (FLAGYL) IVPB 500 mg        500 mg 100 mL/hr over 60 Minutes Intravenous  Once 12/01/20 1859 12/01/20 2008       Assessment/Plan 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 Dossie Der - POD#46 -Soft diet - CT A/P 2/24 with no acute findings, but the patient hadabdominal wall cellulitis around his wound and extending out. Hewasstarted on zosyn 2/24, vanc added 2/25. No elevation of WBC, spiked fevers 2/24 but these have resolved now. (last fever 2/25 PM 100.4). Cellulitis improved.He completed7 days of zosyn/vanc then stop, 3/3.WBC remains WNL(checked on 3/5)and he is afebrile off abx currently -continue to mobilize - pulm toilet  -JP drain removed on POD 9  ABL  anemia- stable on last labs (3/5) Hx of EtOH abuse Hx of Hep Cs/p treatment Liver Cirrhosis-Ammonia 41 (3/11), check PRN, continue lactulose to 60g HTN- prn meds Hx ofbipolar and schizophreniaw/ multiple previous suicide attempts- psych saw 3/18 and continue to recommend inpatient psych. They made med adjustments to Elavil, Abilify and haldol. EKG 3/25 QTc 394.Continue 1:1 sitter.  GNF:AOZHYQM diet, lactulose VTE: lovenox ID: flagyl/ancef 2/15. Zosyn 2/24>>3/3Vanc 2/25>>3/3 Foley:None currently.Voiding  Dispo:Wound stable, continue dressing change.Abdominal binder when sitting up or OOB.Corporate investment banker.Await inpatient psych placement   LOS: 46 days    [name] , MD [facility] Surgery 01/16/2021, 12:53 PM Please see Amion for pager number during day hours 7:00am-4:30pm

## 2021-01-17 NOTE — Progress Notes (Signed)
47 Days Post-Op  Subjective: CC: No complaints Doesn't like the solid food but likes the shakes  Objective: Vital signs in last 24 hours: Temp:  [97.6 F (36.4 C)-98.4 F (36.9 C)] 97.6 F (36.4 C) (04/03 0651) Pulse Rate:  [68-75] 68 (04/03 0651) Resp:  [18] 18 (04/03 0651) BP: (150-164)/(73-76) 164/73 (04/03 0651) SpO2:  [97 %] 97 % (04/03 0651) Last BM Date: 01/17/21  Intake/Output from previous day: 04/02 0701 - 04/03 0700 In: 680 [P.O.:680] Out: -  Intake/Output this shift: No intake/output data recorded.  PE: Gen: Alert, NAD Heart: Reg Pulm: CTA b/l, rate and effort normal Abd: Soft,protuberant, nontender,midline incision stable with open wound  clean and packed.  Small area of either hypergranulation tissue or some other tissue that has come through skin just above wound open.   Msk: No LE edema  Lab Results:  CMP     Component Value Date/Time   NA 136 12/21/2020 0213   K 4.0 12/21/2020 0213   CL 104 12/21/2020 0213   CO2 23 12/21/2020 0213   GLUCOSE 103 (H) 12/21/2020 0213   BUN 7 (L) 12/21/2020 0213   CREATININE 0.96 12/21/2020 0213   CALCIUM 8.9 12/21/2020 0213   PROT 6.7 12/14/2020 0647   ALBUMIN 2.1 (L) 12/14/2020 0647   AST 25 12/14/2020 0647   ALT 19 12/14/2020 0647   ALKPHOS 40 12/14/2020 0647   BILITOT 0.7 12/14/2020 0647   GFRNONAA >60 12/21/2020 0213   Lipase  No results found for: LIPASE     Studies/Results: No results found.  Anti-infectives: Anti-infectives (From admission, onward)   Start     Dose/Rate Route Frequency Ordered Stop   12/13/20 1800  vancomycin (VANCOREADY) IVPB 1000 mg/200 mL        1,000 mg 200 mL/hr over 60 Minutes Intravenous Every 12 hours 12/13/20 0733 12/17/20 1945   12/11/20 1300  vancomycin (VANCOREADY) IVPB 1250 mg/250 mL  Status:  Discontinued        1,250 mg 166.7 mL/hr over 90 Minutes Intravenous Every 12 hours 12/11/20 1230 12/13/20 0733   12/10/20 0900  piperacillin-tazobactam (ZOSYN)  IVPB 3.375 g  Status:  Discontinued        3.375 g 100 mL/hr over 30 Minutes Intravenous Every 8 hours 12/10/20 0808 12/10/20 0811   12/10/20 0900  piperacillin-tazobactam (ZOSYN) IVPB 3.375 g        3.375 g 12.5 mL/hr over 240 Minutes Intravenous Every 8 hours 12/10/20 0811 12/17/20 2359   12/01/20 1900  ceFAZolin (ANCEF) 3 g in dextrose 5 % 50 mL IVPB        3 g 100 mL/hr over 30 Minutes Intravenous  Once 12/01/20 1859 12/01/20 1935   12/01/20 1900  metroNIDAZOLE (FLAGYL) IVPB 500 mg        500 mg 100 mL/hr over 60 Minutes Intravenous  Once 12/01/20 1859 12/01/20 2008       Assessment/Plan 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 Dossie Der - POD#47 -Soft diet - CT A/P 2/24 with no acute findings, but the patient hadabdominal wall cellulitis around his wound and extending out. Hewasstarted on zosyn 2/24, vanc added 2/25. No elevation of WBC, spiked fevers 2/24 but these have resolved now. (last fever 2/25 PM 100.4). Cellulitis improved.He completed7 days of zosyn/vanc then stop, 3/3.WBC remains WNL(checked on 3/5)and he is afebrile off abx currently -continue to mobilize - pulm toilet  -JP drain removed on  POD 9  ABL anemia- stable on last labs (3/5) Hx of EtOH abuse Hx of Hep Cs/p treatment Liver Cirrhosis-Ammonia 41 (3/11), check PRN, continue lactulose to 60g HTN- prn meds Hx ofbipolar and schizophreniaw/ multiple previous suicide attempts- psych saw 3/18 and continue to recommend inpatient psych. They made med adjustments to Elavil, Abilify and haldol. EKG 3/25 QTc 394.Continue 1:1 sitter.  ZOX:WRUEAVW diet, lactulose VTE: lovenox ID: flagyl/ancef 2/15. Zosyn 2/24>>3/3Vanc 2/25>>3/3 Foley:None currently.Voiding  Dispo:Wound stable, continue dressing change.Abdominal binder when sitting up or OOB.Corporate investment banker.Await inpatient psych placement   LOS:  47 days    Gaynelle Adu , MD [facility] Surgery 01/17/2021, 10:39 AM Please see Amion for pager number during day hours 7:00am-4:30pm

## 2021-01-18 ENCOUNTER — Other Ambulatory Visit (HOSPITAL_COMMUNITY): Payer: Self-pay

## 2021-01-18 MED ORDER — HALOPERIDOL 5 MG PO TABS
5.0000 mg | ORAL_TABLET | Freq: Every day | ORAL | 0 refills | Status: DC
  Filled 2021-01-18: qty 20, 20d supply, fill #0

## 2021-01-18 MED ORDER — ARIPIPRAZOLE 5 MG PO TABS
5.0000 mg | ORAL_TABLET | Freq: Every day | ORAL | 0 refills | Status: DC
  Filled 2021-01-18: qty 14, 14d supply, fill #0

## 2021-01-18 MED ORDER — OXYCODONE HCL 5 MG PO TABS
5.0000 mg | ORAL_TABLET | Freq: Three times a day (TID) | ORAL | 0 refills | Status: DC | PRN
  Filled 2021-01-18: qty 10, 4d supply, fill #0

## 2021-01-18 MED ORDER — AMITRIPTYLINE HCL 75 MG PO TABS
150.0000 mg | ORAL_TABLET | Freq: Every day | ORAL | 0 refills | Status: DC
  Filled 2021-01-18: qty 40, 20d supply, fill #0

## 2021-01-18 MED ORDER — ACETAMINOPHEN 325 MG PO TABS: 650.0000 mg | ORAL_TABLET | Freq: Four times a day (QID) | ORAL | Status: DC | PRN

## 2021-01-18 MED ORDER — ARIPIPRAZOLE 10 MG PO TABS
10.0000 mg | ORAL_TABLET | Freq: Every day | ORAL | 0 refills | Status: DC
Start: 2021-01-18 — End: 2021-02-11
  Filled 2021-01-18: qty 14, 14d supply, fill #0

## 2021-01-18 NOTE — Progress Notes (Addendum)
Nursing Note: Spoke with patient this morning who stated that he no longer wished to have a sitter. He also would like to be discharged from the hospital.    @1245  Spoke to the patient again along with the Nurse Practitioner. He reiterated that he would like to be a free man and leave. He verbilized he did not wish to harm himself and was not suicidal.

## 2021-01-18 NOTE — TOC Transition Note (Signed)
Transition of Care [facility]) - CM/SW Discharge Note   Patient Details  Name: [name] MRN: [id number] Date of Birth: 1956-02-06  Transition of [facility]) CM/SW Contact:  Jimmy Picket, [place] Phone Number: 01/18/2021, 3:23 PM   Clinical Narrative:     CSW called [facility] [facility] and [facility] to complete referral for ACTT. Those agencies where either at capicty of did not answer the phone.   CSW called [facility] who accepted the referral. CSW gave approximate address of pt ( behind [employer] on [address]) and phone number of cousin. Pt reports he will have a phone in the next 2 days. CSW also provided address and phone number of [facility] for pt to follow up when he gets a phone.   CSW will sign off for now as social work intervention is no longer needed. Please consult [place] again if new needs arise.   Final next level of care: Home/Self Care Barriers to Discharge: Continued Medical Work up   Patient Goals and CMS Choice Patient states their goals for this hospitalization and ongoing recovery are:: to leave hospital CMS Medicare.gov Compare Post Acute Care list provided to:: Patient    Discharge Placement                    Patient and family notified of of transfer: 01/18/21  Discharge Plan and Services   Discharge Planning Services: CM Consult                                 Social Determinants of Health (SDOH) Interventions     Readmission Risk Interventions No flowsheet data found.  Jimmy Picket, Theresia Majors, [place] Clinical Social Worker [phone]
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
## 2021-01-18 NOTE — Progress Notes (Signed)
Pt is hoping to speak with the doctor today about not having a sitter so he could be discharged home.

## 2021-01-18 NOTE — Discharge Instructions (Signed)
MIDLINE WOUND CARE: °- midline dressing to be changed twice daily °- supplies: sterile saline, kerlix, scissors, ABD pads, tape  °- remove dressing and all packing carefully, moistening with sterile saline as needed to avoid packing/internal dressing sticking to the wound. °- clean edges of skin around the wound with water/gauze, making sure there is no tape debris or leakage left on skin that could cause skin irritation or breakdown. °- dampen and clean kerlix with sterile saline and pack wound from wound base to skin level, making sure to take note of any possible areas of wound tracking, tunneling and packing appropriately. Wound can be packed loosely. Trim kerlix to size if a whole kerlix is not required. °- cover wound with a dry ABD pad and secure with tape.  °- write the date/time on the dry dressing/tape to better track when the last dressing change occurred. °- apply any skin protectant/powder recommended by clinician to protect skin/skin folds. °- change dressing as needed if leakage occurs, wound gets contaminated, or patient requests to shower. °- patient may shower daily with wound open and following the shower the wound should be dried and a clean dressing placed.  ° °CC[facility]      [facility] °[phone] ° °OPEN ABDOMINAL SURGERY: POST OP INSTRUCTIONS ° °Always review your discharge instruction sheet given to you by the facility where your surgery was performed. ° °IF YOU HAVE DISABILITY OR FAMILY LEAVE FORMS, YOU MUST BRING THEM TO THE OFFICE FOR PROCESSING.  PLEASE DO NOT GIVE THEM TO YOUR DOCTOR. ° °1. A prescription for pain medication may be given to you upon discharge.  Take your pain medication as prescribed, if needed.  If narcotic pain medicine is not needed, then you may take acetaminophen (Tylenol) or ibuprofen (Advil) as needed. °2. Take your usually prescribed medications unless otherwise directed. °3. If you need a refill on your pain medication, please contact your  pharmacy. They will contact our office to request authorization.  Prescriptions will not be filled after 5pm or on week-ends. °4. You should follow a light diet the first few days after arrival home, such as soup and crackers, pudding, etc.unless your doctor has advised otherwise. A high-fiber, low fat diet can be resumed as tolerated.   Be sure to include lots of fluids daily. Most patients will experience some swelling and bruising on the chest and neck area.  Ice packs will help.  Swelling and bruising can take several days to resolve °5. Most patients will experience some swelling and bruising in the area of the incision. Ice pack will help. Swelling and bruising can take several days to resolve..  °6. It is common to experience some constipation if taking pain medication after surgery.  Increasing fluid intake and taking a stool softener will usually help or prevent this problem from occurring.  A mild laxative (Milk of Magnesia or Miralax) should be taken according to package directions if there are no bowel movements after 48 hours. °7.  You may have steri-strips (small skin tapes) in place directly over the incision.  These strips should be left on the skin for 7-10 days.  If your surgeon used skin glue on the incision, you may shower in 24 hours.  The glue will flake off over the next 2-3 weeks.  Any sutures or staples will be removed at the office during your follow-up visit. You may find that a light gauze bandage over your incision may keep your staples from being rubbed or pulled.   You may shower and replace the bandage daily. °8. ACTIVITIES:  You may resume regular (light) daily activities beginning the next day--such as daily self-care, walking, climbing stairs--gradually increasing activities as tolerated.  You may have sexual intercourse when it is comfortable.  Refrain from any heavy lifting or straining until approved by your doctor. °a. You may drive when you no longer are taking prescription pain  medication, you can comfortably wear a seatbelt, and you can safely maneuver your car and apply brakes °b. Return to Work: ___________________________________ °9. You should see your doctor in the office for a follow-up appointment approximately two weeks after your surgery.  Make sure that you call for this appointment within a day or two after you arrive home to insure a convenient appointment time. °OTHER INSTRUCTIONS:  °_____________________________________________________________ °_____________________________________________________________ ° °WHEN TO CALL YOUR DOCTOR: °1. Fever over 101.0 °2. Inability to urinate °3. Nausea and/or vomiting °4. Extreme swelling or bruising °5. Continued bleeding from incision. °6. Increased pain, redness, or drainage from the incision. °7. Difficulty swallowing or breathing °8. Muscle cramping or spasms. °9. Numbness or tingling in hands or feet or around lips. ° °The clinic staff is available to answer your questions during regular business hours.  Please don’t hesitate to call and ask to speak to one of the nurses if you have concerns. ° °For further questions, please visit [email or web address] ° ° ° °
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
## 2021-01-19 ENCOUNTER — Encounter (HOSPITAL_COMMUNITY): Payer: Self-pay

## 2021-01-20 ENCOUNTER — Emergency Department (HOSPITAL_COMMUNITY)
Admission: EM | Admit: 2021-01-20 | Discharge: 2021-01-26 | Disposition: A | Discharge status: Other Acute Inpatient Hospital | Payer: Medicaid Other | Attending: Emergency Medicine | Admitting: Emergency Medicine

## 2021-01-20 ENCOUNTER — Other Ambulatory Visit: Payer: Self-pay

## 2021-01-20 DIAGNOSIS — T1491XA Suicide attempt, initial encounter: Secondary | ICD-10-CM

## 2021-01-20 DIAGNOSIS — R45851 Suicidal ideations: Secondary | ICD-10-CM | POA: Diagnosis not present

## 2021-01-20 DIAGNOSIS — Z20822 Contact with and (suspected) exposure to covid-19: Secondary | ICD-10-CM | POA: Insufficient documentation

## 2021-01-20 DIAGNOSIS — Z5900 Homelessness unspecified: Secondary | ICD-10-CM | POA: Insufficient documentation

## 2021-01-20 DIAGNOSIS — R45850 Homicidal ideations: Secondary | ICD-10-CM | POA: Diagnosis not present

## 2021-01-20 DIAGNOSIS — F329 Major depressive disorder, single episode, unspecified: Secondary | ICD-10-CM | POA: Insufficient documentation

## 2021-01-20 LAB — CBC WITH DIFFERENTIAL/PLATELET
Abs Immature Granulocytes: 0.02 10*3/uL (ref 0.00–0.07)
Basophils Absolute: 0.1 10*3/uL (ref 0.0–0.1)
Basophils Relative: 1 %
Eosinophils Absolute: 0.2 10*3/uL (ref 0.0–0.5)
Eosinophils Relative: 3 %
HCT: 34.3 % — ABNORMAL LOW (ref 39.0–52.0)
Hemoglobin: 10.7 g/dL — ABNORMAL LOW (ref 13.0–17.0)
Immature Granulocytes: 0 %
Lymphocytes Relative: 40 %
Lymphs Abs: 3.3 10*3/uL (ref 0.7–4.0)
MCH: 24.3 pg — ABNORMAL LOW (ref 26.0–34.0)
MCHC: 31.2 g/dL (ref 30.0–36.0)
MCV: 77.8 fL — ABNORMAL LOW (ref 80.0–100.0)
Monocytes Absolute: 0.8 10*3/uL (ref 0.1–1.0)
Monocytes Relative: 9 %
Neutro Abs: 3.9 10*3/uL (ref 1.7–7.7)
Neutrophils Relative %: 47 %
Platelets: 200 10*3/uL (ref 150–400)
RBC: 4.41 MIL/uL (ref 4.22–5.81)
RDW: 17.2 % — ABNORMAL HIGH (ref 11.5–15.5)
WBC: 8.3 10*3/uL (ref 4.0–10.5)
nRBC: 0 % (ref 0.0–0.2)

## 2021-01-20 LAB — BASIC METABOLIC PANEL
Anion gap: 9 (ref 5–15)
BUN: 11 mg/dL (ref 8–23)
CO2: 21 mmol/L — ABNORMAL LOW (ref 22–32)
Calcium: 8.8 mg/dL — ABNORMAL LOW (ref 8.9–10.3)
Chloride: 106 mmol/L (ref 98–111)
Creatinine, Ser: 0.95 mg/dL (ref 0.61–1.24)
GFR, Estimated: >60 mL/min (ref >60)
Glucose, Bld: 107 mg/dL — ABNORMAL HIGH (ref 70–99)
Potassium: 3.4 mmol/L — ABNORMAL LOW (ref 3.5–5.1)
Sodium: 136 mmol/L (ref 135–145)

## 2021-01-20 LAB — RAPID URINE DRUG SCREEN, HOSP PERFORMED
Amphetamines: NOT DETECTED
Barbiturates: NOT DETECTED
Benzodiazepines: NOT DETECTED
Cocaine: NOT DETECTED
Opiates: NOT DETECTED
Tetrahydrocannabinol: NOT DETECTED

## 2021-01-20 LAB — ETHANOL: Alcohol, Ethyl (B): 93 mg/dL — ABNORMAL HIGH (ref <10)

## 2021-01-20 MED ORDER — AMITRIPTYLINE HCL 25 MG PO TABS
150.0000 mg | ORAL_TABLET | Freq: Every day | ORAL | Status: DC
  Administered 2021-01-20: 150 mg via ORAL
  Filled 2021-01-20: qty 6

## 2021-01-20 MED ORDER — ARIPIPRAZOLE 5 MG PO TABS
15.0000 mg | ORAL_TABLET | Freq: Every day | ORAL | Status: DC
  Administered 2021-01-20 – 2021-01-26 (×8): 15 mg via ORAL
  Filled 2021-01-20 (×7): qty 1

## 2021-01-20 MED ORDER — ARIPIPRAZOLE 5 MG PO TABS: 5.0000 mg | ORAL_TABLET | Freq: Every day | ORAL | Status: DC

## 2021-01-20 MED ORDER — ACETAMINOPHEN 325 MG PO TABS
650.0000 mg | ORAL_TABLET | Freq: Four times a day (QID) | ORAL | Status: DC | PRN
  Administered 2021-01-20 – 2021-01-25 (×5): 650 mg via ORAL
  Filled 2021-01-20 (×5): qty 2

## 2021-01-20 MED ORDER — PANTOPRAZOLE SODIUM 40 MG PO TBEC
40.0000 mg | DELAYED_RELEASE_TABLET | Freq: Every day | ORAL | Status: DC
  Administered 2021-01-20 – 2021-01-26 (×7): 40 mg via ORAL
  Filled 2021-01-20 (×7): qty 1

## 2021-01-20 MED ORDER — ARIPIPRAZOLE 10 MG PO TABS: 10.0000 mg | ORAL_TABLET | Freq: Every day | ORAL | Status: DC

## 2021-01-20 MED ORDER — HALOPERIDOL 5 MG PO TABS
5.0000 mg | ORAL_TABLET | Freq: Every day | ORAL | Status: DC
Start: 2021-01-20 — End: 2021-01-26
  Administered 2021-01-20 – 2021-01-25 (×6): 5 mg via ORAL
  Filled 2021-01-20 (×6): qty 1

## 2021-01-20 NOTE — ED Notes (Signed)
Security to remain at bedside
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
## 2021-01-20 NOTE — ED Notes (Signed)
Pt refusing VS, no security at bedside. Will return to attempt labs. Pt content with meal

## 2021-01-20 NOTE — ED Notes (Signed)
Pt calm and cooperative at this time. Pt allowed RN to take vital signs.  Repeatedly ask staff when he will be going upstairs to [address]. Pt made aware by [employer] that he was IVC'd and would be staying down here. Pt states understanding.

## 2021-01-20 NOTE — ED Notes (Signed)
Medications brought in by patient, including 7 tablets 5 mg oxycodone. Counted by this RN verified by Percival Spanish RN and pharmacy tech.

## 2021-01-20 NOTE — ED Triage Notes (Signed)
Pt BIB EMS for SI. EMS also reports pt has a abdominal incision that is draining.

## 2021-01-20 NOTE — ED Notes (Signed)
Pt belongings in locker #6. [place] with security.
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
## 2021-01-21 LAB — RESP PANEL BY RT-PCR (FLU A&B, COVID) ARPGX2
Influenza A by PCR: NEGATIVE
Influenza B by PCR: NEGATIVE
SARS Coronavirus 2 by RT PCR: NEGATIVE

## 2021-01-21 MED ORDER — LACTULOSE 10 GM/15ML PO SOLN
10.0000 g | Freq: Once | ORAL | Status: AC
  Administered 2021-01-21: 10 g via ORAL
  Filled 2021-01-21: qty 30

## 2021-01-21 MED ORDER — ZIPRASIDONE MESYLATE 20 MG IM SOLR: 20.0000 mg | Freq: Two times a day (BID) | INTRAMUSCULAR | Status: DC | PRN

## 2021-01-21 MED ORDER — AMITRIPTYLINE HCL 50 MG PO TABS
150.0000 mg | ORAL_TABLET | Freq: Every day | ORAL | Status: DC
  Administered 2021-01-21 – 2021-01-25 (×5): 150 mg via ORAL
  Filled 2021-01-21: qty 2
  Filled 2021-01-21 (×2): qty 6
  Filled 2021-01-21 (×5): qty 3
  Filled 2021-01-21: qty 6
  Filled 2021-01-21: qty 3
  Filled 2021-01-21: qty 6

## 2021-01-21 MED ORDER — POTASSIUM CHLORIDE CRYS ER 20 MEQ PO TBCR
20.0000 meq | EXTENDED_RELEASE_TABLET | Freq: Once | ORAL | Status: AC
  Administered 2021-01-21: 20 meq via ORAL
  Filled 2021-01-21: qty 1

## 2021-01-21 NOTE — ED Notes (Signed)
Pt again attempting to reach parole officer via phone, no answer

## 2021-01-21 NOTE — ED Notes (Signed)
Pt attempted to reach parole officer by phone; no answer, left VM

## 2021-01-21 NOTE — ED Notes (Signed)
Pt calm and cooperative with staff  Ambulatory to restroom   Provided snack and beverage

## 2021-01-21 NOTE — ED Notes (Signed)
Pt on call with TTS 

## 2021-01-21 NOTE — ED Notes (Signed)
Pt speaking on phone with parole officer

## 2021-01-21 NOTE — ED Notes (Signed)
Pt is awake, alert and oriented. Pt is anxious but easy to redirect. Reports SI with no specific plan. Pt states, "I have nothing to look forward to. I just want to die." Pt is cooperative at this time. Hospital bed given to pt. Pt is also asking for lactulose. Reports no BM in 3 days. Will notify MD. Safety precautions maintained. Pt is IVC. Will continue to monitor.

## 2021-01-21 NOTE — ED Notes (Signed)
Dinner tray at bedside

## 2021-01-21 NOTE — ED Notes (Signed)
Pt became very agitated when told he was moving to a different room that does not have a remote.  He stated he wanted to leave.

## 2021-01-21 NOTE — ED Provider Notes (Signed)
Emergency Medicine Observation Re-evaluation Note  [name] is a 65 y.o. male, seen on rounds today.  Pt initially presented to the ED for complaints of Homicidal Currently, the patient is agitated, verbally aggressive, threatening, including to kill me, as I interacted with him on morning rounds.  Physical Exam  BP (!) 170/101   Pulse 81   Temp 97.7 F (36.5 C) (Oral)   Resp 18   SpO2 96%  Physical Exam General: Awake, alert, ambulatory, walking about the room. Skeletal skeletal: No deformities, moves all extremities spontaneously Lungs: No audible wheezing, no increased work of breathing Psych: Homicidal, agitated, aggressive, labile  ED Course / MDM  EKG:   I have reviewed the labs performed to date as well as medications administered while in observation.  Recent changes in the last 24 hours include agitation.  Plan  Current plan is for inpatient hospitalization. Patient is under full IVC at this time.   Gerhard Munch, MD 01/21/21 0900

## 2021-01-21 NOTE — Progress Notes (Signed)
Patient meets inpatient criteria per Dr. Jeraldine Loots.    Patient was referred to the following facilities:  Service Provider Address Phone Fax  [facility]  [address], [place] [address] [phone]  [facility] [address]  [address] [place] [address] [phone]  [facility]  [address] [place] [address] [phone]  [facility]  [address] [place], [place] [address] [phone]  [facility]  [address], [place] [address] [phone]  [facility]  [address] [place] [address] [phone]  [facility]  [address] [place] [address] [phone]  [facility]  [address] [place] [address] [phone]  [facility]  [address] [place] [address] [phone]  [facility]  [address], [place] [address] [phone]  [facility]  [address], [place] [address] [phone]  [facility]  [address], [place] [address] [phone]  [facility]  [address], [place] [address] [phone]  [facility]  [address], [place] [address] [phone]  [facility]  [address] [place] [address] [phone]  [facility]  [address] [place] [address] [phone]      CSW will continue to monitor for disposition.  Penni Homans, MSW, LCSW 01/21/2021 10:55 AM

## 2021-01-21 NOTE — ED Notes (Signed)
Pt attempting to contact parole officer via phone, left message

## 2021-01-21 NOTE — BH Assessment (Signed)
Disposition [name], NP, patient meets inpatient criteria. Geropsychiatry is recommended. Disposition SW will secure placement in the AM. [name], RN, informed of disposition.
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
## 2021-01-21 NOTE — ED Notes (Addendum)
Received notification from Lum Keas, that [facility] would like a phone call for RN Libby Maw [phone]); this RN returned call and spoke with Lakitric, who requests that she be notified when pt finds placement so that me keep up with his whereabouts (states otherwise she will need to place warrant for pt); this RN acknowledges request; Lakitric also requesting that pt call her at 4pm today; this RN acknowledges request, will notify pt of request

## 2021-01-21 NOTE — ED Notes (Signed)
Pt agitated, threatening staff with violence; pt redirected to sit back down in room; security at bedside

## 2021-01-22 MED ORDER — HYDROCORTISONE 1 % EX CREA
TOPICAL_CREAM | Freq: Three times a day (TID) | CUTANEOUS | Status: DC
  Filled 2021-01-22: qty 28
  Filled 2021-01-22: qty 28.35

## 2021-01-22 MED ORDER — LACTULOSE 10 GM/15ML PO SOLN
60.0000 g | Freq: Every day | ORAL | Status: DC | PRN
  Administered 2021-01-22 – 2021-01-23 (×2): 60 g via ORAL
  Filled 2021-01-22 (×2): qty 90

## 2021-01-22 NOTE — ED Notes (Signed)
DSY applied to ABD wound.

## 2021-01-22 NOTE — Progress Notes (Signed)
CSW received call from [facility] there are NO beds currently available.  Penni Homans, MSW, LCSW 01/22/2021 1:57 PM

## 2021-01-22 NOTE — ED Notes (Signed)
Face to Face assessment by TTS this morning

## 2021-01-22 NOTE — BHH Counselor (Addendum)
TTS reassessment: Patient is pleasant and cooperative during assessment. Patient states, "I feel the same as I did when I got in here. Am I going to get transferred anywhere?" Patient reports he was released from prison in January after serving 11 1/2 years. He was unable to get his social security benefits and Medicaid turned back on. He states they released him with no where to go. In February he states he attempted suicide by "cutting my belly open with a beer bottle" and states he was medically hospitalized until last Monday. He states he has nowhere to go. He reports he began to feel suicidal again and cut his wound open. He continues to endorse SI. He denies SI/AVH. It seems his SI is triggered by his social needs (housing, medical, food) not being met. He asked that his PO be updated on his plan of care.  P.O: Officer Linton Rump [phone] or [phone].  Per Dr. Dwyane Dee patient continues to meet in patient criteria.

## 2021-01-22 NOTE — ED Notes (Signed)
Provided pt with sprite.

## 2021-01-22 NOTE — ED Provider Notes (Signed)
  Physical Exam  BP (!) 166/79 (BP Location: Left Arm)   Pulse 72   Temp 98.1 F (36.7 C) (Oral)   Resp 15   SpO2 98%   Physical Exam  ED Course/Procedures     Procedures  MDM  Patient under IVC.  Well-appearing today.  Pending placement.  Requesting hydrocortisone and lactulose.  States he is on a high dose of lactulose 3 times a day in order to have bowel movements.  States has been on this for years.  Will restart medications.  Still pending placement.       Benjiman Core, MD 01/22/21 [phone]

## 2021-01-22 NOTE — Progress Notes (Signed)
Patient meets inpatient criteria per Dr. Jeraldine Loots.    Patient was referred to the following facilities:   Service Provider Address Phone Fax  [facility]  [address] [place] [address] [phone]  [facility] [address]  [address], [place] [address] [phone]  [facility]  [address] [place] [address] [phone]  [facility]  [address] [place], [place] [address] [phone]  [facility]  [address], [place] [address] [phone]  [facility]  [address] [place] [address] [phone]  [facility]  [address], [place] [address] [phone]  [facility]  [address], [place] [address] [phone]  [facility]  [address] [place] [address] [phone]  [facility]  [address], [place] [address] [phone]  [facility]  [address] [place] [address] [phone]  [facility]  [address] [place] [address] [phone]  [facility]  [address], [place] [address] [phone]  [facility]  [address] [place] [address] [phone]  [facility]  [address] [place] [address] [phone]  [facility]  [address], [place] [address] [phone]      CSW will continue to monitor for disposition.  Penni Homans, MSW, LCSW 01/22/2021 10:10 AM

## 2021-01-22 NOTE — Progress Notes (Addendum)
Disposition Progress Note  Reason: Patient disposition discussed in morning huddle and was informed that patient needs resources for social determinant barriers while in ED as he waits for a geropsych bed.   Intervention: CSW secure chatted [name] ED social worker, Susa Simmonds, regarding assisting patient to provide resources.  CSW from ED and disposition CSW problem solved responsibilities and ability to discuss resources with patient. ED CSW unsure if these included in her responsibilities and requested what provider indicated that resources needed to be given.  Disposition CSW referred to Doran Heater, FNP, note which indicated the resources patient needed as well as 9am morning huddle where request was made.    Follow up: CSW will provide resources and further discussion about allocation or responsibilities will be clarified. ED CSW followed up and said that he was provided with resources and declined.   Escarlet Saathoff, LCSW, LCAS Clincal Social Worker  [facility]

## 2021-01-22 NOTE — ED Notes (Signed)
Patient denies pain and is resting comfortably.  

## 2021-01-22 NOTE — Progress Notes (Signed)
CSW spoke with patient and attempted to give patient shelter resources and outpatient therapy/counseling. Patient stated he was given all that information when he was on the 6th floor and that information is in his bag. Patient told CSW he does not need any of that information and a lady spoke with him earlier about placement in [place]. Patient also stated he has contacted social security and the earliest appointment he could get was May 16th. Patient is hoping to be in his new placement for a year. Patient stated he has no family or support but is hopeful his social security will kick in.

## 2021-01-22 NOTE — Consult Note (Signed)
Medical record reviewed by this nurse practitioner with psychiatrist, Dr. Lucianne Muss.  Social work consult initiated as it appears patient suicidality complicated by social determinants including lack of housing and recent loss of Social Security income. Recommendation continues to remain inpatient geriatric psychiatric treatment at this time.

## 2021-01-23 MED ORDER — ARTIFICIAL TEARS OPHTHALMIC OINT
1.0000 "application " | TOPICAL_OINTMENT | Freq: Every day | OPHTHALMIC | Status: DC
  Administered 2021-01-23 – 2021-01-25 (×3): 1 via OPHTHALMIC
  Filled 2021-01-23: qty 3.5

## 2021-01-23 MED ORDER — LACTULOSE 10 GM/15ML PO SOLN
45.0000 g | Freq: Every day | ORAL | Status: DC
  Administered 2021-01-24 – 2021-01-25 (×2): 45 g via ORAL
  Filled 2021-01-23 (×3): qty 90

## 2021-01-23 NOTE — Progress Notes (Signed)
Per Dr. Lucianne Muss, patient meets criteria for inpatient treatment. There are no appropriate beds at [facility] today. CSW faxed referrals to the following facilities for review:  [facility]  [facility]  TTS will continue to seek bed placement.  Crissie Reese, MSW, LCSW-A, LCAS-A Phone: [phone] Disposition/TOC

## 2021-01-23 NOTE — ED Provider Notes (Signed)
Emergency Medicine Observation Re-evaluation Note  [name] is a 65 y.o. male, seen on rounds today.  Pt initially presented to the ED for complaints of Homicidal Currently, the patient is resting comfortably and interactive.  He requests updating his medications to include Lacri-Lube for "dry socket," and scheduled lactulose for ongoing constipation.  Physical Exam  BP (!) 152/71 (BP Location: Left Arm)   Pulse 71   Temp 97.6 F (36.4 C) (Oral)   Resp 18   SpO2 99%  Physical Exam General: Calm Cardiac: Normal heart Lungs: Normal respiratory rate Psych: Not aggressive or threatening  ED Course / MDM  EKG:   I have reviewed the labs performed to date as well as medications administered while in observation.  Recent changes in the last 24 hours include he is calm and cooperative.  Plan  Current plan is for psychiatric admission. Patient is under full IVC at this time.   Mancel Bale, MD 01/23/21 [phone]

## 2021-01-24 NOTE — BH Assessment (Signed)
Patient was seen for re-assessment.  He states that he is feeling better and denies current SI/HI, but states that he cannot contract that noting will happen if he leaves the hospital.  Patient states, "I need longer term placement."  When I informed patient that short term crisis stabilization would probably be the placement that would be occurring and that he would have to work with them on placement in a longer term facility, he got upset and said "I might as well go out there and fend for myself."  Evidently, patient had been in a program for 62 days and left and was unable to get back in.  Staffed case with Doran Heater, NP.  Patient has an anti-social element about him and she states that he would most likely do something to himself again and end up returning to the ED again.  She recommends that we continue to seek placement for patient.

## 2021-01-24 NOTE — ED Provider Notes (Signed)
Emergency Medicine Observation Re-evaluation Note  [name] is a 65 y.o. male, seen on rounds today.  Pt initially presented to the ED for complaints of Suicidal Currently, the patient is lying in bed, states he just met with the behavioral health specialist and states that he can only have 7 days of placement that he would like to be discharged so that he can check in with his parole officer, service time and moved to [place].  Physical Exam  BP (!) 168/91 (BP Location: Left Arm)   Pulse 69   Temp 98.1 F (36.7 C) (Oral)   Resp 20   SpO2 96%  Physical Exam General: Alert, calm, cooperative Skin: Bandage in place, clean, dry and intact Lungs: Abrasions even and unlabored Psych: Calm and cooperative  ED Course / MDM  EKG:   I have reviewed the labs performed to date as well as medications administered while in observation.  Recent changes in the last 24 hours include reassessed, calm and cooperative.  Plan  Current plan is for placement. Patient is under full IVC at this time.   [name], PA-C 01/24/21 1316    Daleen Bo, MD 01/25/21 1124

## 2021-01-25 NOTE — ED Notes (Signed)
Pt is now at desk reporting he will not go to [facility]

## 2021-01-25 NOTE — Progress Notes (Signed)
Patient denied at [facility] due to "daily alcohol use and substance use".   [facility] does not have any beds available.   Penni Homans, MSW, LCSW 01/25/2021 10:11 AM

## 2021-01-25 NOTE — ED Notes (Addendum)
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

## 2021-01-25 NOTE — Progress Notes (Signed)
Patient meets inpatient criteria per Dr. Jeraldine Loots.   Patient was referred to the following facilities:  Service Provider Address Phone Fax  [facility]  [address], [place] [address] [phone]  [facility] [address]  [address], [place] [address] [phone]  [facility]  [address] [place] [address] [phone]  [facility]  [address] [place], [place] [address] [phone]  [facility]  [address], [place] [address] [phone]  [facility]  [address] [place] [address] [phone]  [facility]  [address], [place] [address] [phone]  [facility]  [address] [place] [address] [phone]  [facility]  [address], [place] [address] [phone]  [facility]  [address] [place] [address] [phone]  [facility]  [address], [place] [address] [phone]  [facility]  [address] [place] [address] [phone]  [facility]  [address] [place] [address] [phone]  [facility]  [address] [place] [address] [phone]  [facility]  [address], [place] [address] [phone]  [facility]  [address], [place] [address] [phone]  [facility]  [address], [place] [address] [phone]      CSW will continue to monitor for  disposition.  Penni Homans, MSW, LCSW 01/25/2021 9:50 AM

## 2021-01-25 NOTE — ED Notes (Signed)
Pt is accepted and is IVC

## 2021-01-25 NOTE — Progress Notes (Signed)
Patient was accepted to [facility].    Meets inpatient criteria per Dr. Jeraldine Loots.    Attending physician is Dr.  Estill Cotta.    Notified [place] [employer], RN of acceptance.  Nurses call report to [phone].    Patient can arrive 01/26/2021 at 7AM.   Penni Homans, MSW, LCSW 01/25/2021 10:20 AM

## 2021-01-25 NOTE — ED Provider Notes (Signed)
  Physical Exam  BP (!) 171/71 (BP Location: Left Arm)   Pulse 88   Temp 98.1 F (36.7 C) (Oral)   Resp 18   SpO2 98%   Physical Exam  ED Course/Procedures     Procedures  MDM  Resting comfortably.  Still pending placement.  Patient reportedly wants long-term placement but likely only get shorter term.  Still under IVC.  Psychiatry thinks he is high risk of harming himself again if he is discharged.      Benjiman Core, MD 01/25/21 [phone]

## 2021-01-26 NOTE — ED Notes (Signed)
Sheriff's department here to transport pt to [facility].

## 2021-01-26 NOTE — ED Provider Notes (Signed)
Emergency Medicine Observation Re-evaluation Note  [name] is a 65 y.o. male, seen on rounds today.  Pt initially presented to the ED for complaints of Suicidal Currently, the patient is resting in bed.  Physical Exam  BP (!) 139/56 (BP Location: Right Arm)   Pulse 65   Temp 98.4 F (36.9 C) (Oral)   Resp 20   SpO2 97%  Physical Exam General: resting comfortably, NAD Lungs: normal WOB Psych: currently calm and resting  ED Course / MDM  EKG:   I have reviewed the labs performed to date as well as medications administered while in observation.  Recent changes in the last 24 hours include none.  Plan  Current plan is for placement for inpatient psychiatric care.   Rozelle Logan, [place] 01/26/21 [phone]

## 2021-01-26 NOTE — ED Provider Notes (Signed)
Patient accepted to [facility]. EMTALA completed.    Pollyann Savoy, MD 01/26/21 1048

## 2021-02-07 ENCOUNTER — Emergency Department (HOSPITAL_COMMUNITY): Payer: Medicaid Other

## 2021-02-07 ENCOUNTER — Other Ambulatory Visit: Payer: Self-pay

## 2021-02-07 ENCOUNTER — Inpatient Hospital Stay (HOSPITAL_COMMUNITY)
Admission: EM | Admit: 2021-02-07 | Discharge: 2021-02-11 | DRG: 896 | Payer: Medicaid Other | Attending: Internal Medicine | Admitting: Internal Medicine

## 2021-02-07 DIAGNOSIS — G8929 Other chronic pain: Secondary | ICD-10-CM | POA: Diagnosis present

## 2021-02-07 DIAGNOSIS — Z91013 Allergy to seafood: Secondary | ICD-10-CM

## 2021-02-07 DIAGNOSIS — L739 Follicular disorder, unspecified: Secondary | ICD-10-CM | POA: Diagnosis present

## 2021-02-07 DIAGNOSIS — N509 Disorder of male genital organs, unspecified: Secondary | ICD-10-CM | POA: Diagnosis present

## 2021-02-07 DIAGNOSIS — D509 Iron deficiency anemia, unspecified: Secondary | ICD-10-CM | POA: Diagnosis present

## 2021-02-07 DIAGNOSIS — H21562 Pupillary abnormality, left eye: Secondary | ICD-10-CM | POA: Diagnosis present

## 2021-02-07 DIAGNOSIS — R569 Unspecified convulsions: Secondary | ICD-10-CM | POA: Diagnosis present

## 2021-02-07 DIAGNOSIS — R1011 Right upper quadrant pain: Secondary | ICD-10-CM

## 2021-02-07 DIAGNOSIS — R4182 Altered mental status, unspecified: Secondary | ICD-10-CM | POA: Diagnosis not present

## 2021-02-07 DIAGNOSIS — R52 Pain, unspecified: Secondary | ICD-10-CM

## 2021-02-07 DIAGNOSIS — F419 Anxiety disorder, unspecified: Secondary | ICD-10-CM | POA: Diagnosis not present

## 2021-02-07 DIAGNOSIS — G9341 Metabolic encephalopathy: Secondary | ICD-10-CM

## 2021-02-07 DIAGNOSIS — F319 Bipolar disorder, unspecified: Secondary | ICD-10-CM | POA: Diagnosis present

## 2021-02-07 DIAGNOSIS — Z20822 Contact with and (suspected) exposure to covid-19: Secondary | ICD-10-CM | POA: Diagnosis present

## 2021-02-07 DIAGNOSIS — F10239 Alcohol dependence with withdrawal, unspecified: Principal | ICD-10-CM | POA: Diagnosis present

## 2021-02-07 DIAGNOSIS — G928 Other toxic encephalopathy: Secondary | ICD-10-CM | POA: Diagnosis not present

## 2021-02-07 DIAGNOSIS — K7460 Unspecified cirrhosis of liver: Secondary | ICD-10-CM | POA: Diagnosis not present

## 2021-02-07 DIAGNOSIS — F10139 Alcohol abuse with withdrawal, unspecified: Secondary | ICD-10-CM | POA: Diagnosis not present

## 2021-02-07 DIAGNOSIS — R823 Hemoglobinuria: Secondary | ICD-10-CM | POA: Diagnosis present

## 2021-02-07 DIAGNOSIS — G929 Unspecified toxic encephalopathy: Secondary | ICD-10-CM | POA: Diagnosis present

## 2021-02-07 DIAGNOSIS — R339 Retention of urine, unspecified: Secondary | ICD-10-CM | POA: Diagnosis present

## 2021-02-07 DIAGNOSIS — K219 Gastro-esophageal reflux disease without esophagitis: Secondary | ICD-10-CM | POA: Diagnosis present

## 2021-02-07 DIAGNOSIS — M6282 Rhabdomyolysis: Secondary | ICD-10-CM | POA: Diagnosis present

## 2021-02-07 DIAGNOSIS — K7030 Alcoholic cirrhosis of liver without ascites: Secondary | ICD-10-CM | POA: Diagnosis present

## 2021-02-07 DIAGNOSIS — F332 Major depressive disorder, recurrent severe without psychotic features: Secondary | ICD-10-CM | POA: Diagnosis not present

## 2021-02-07 DIAGNOSIS — K5900 Constipation, unspecified: Secondary | ICD-10-CM | POA: Diagnosis present

## 2021-02-07 DIAGNOSIS — F411 Generalized anxiety disorder: Secondary | ICD-10-CM | POA: Diagnosis present

## 2021-02-07 DIAGNOSIS — M545 Low back pain, unspecified: Secondary | ICD-10-CM | POA: Diagnosis not present

## 2021-02-07 DIAGNOSIS — E872 Acidosis: Secondary | ICD-10-CM | POA: Diagnosis present

## 2021-02-07 DIAGNOSIS — Z87891 Personal history of nicotine dependence: Secondary | ICD-10-CM

## 2021-02-07 DIAGNOSIS — I119 Hypertensive heart disease without heart failure: Secondary | ICD-10-CM | POA: Diagnosis present

## 2021-02-07 DIAGNOSIS — B182 Chronic viral hepatitis C: Secondary | ICD-10-CM | POA: Diagnosis present

## 2021-02-07 DIAGNOSIS — F10939 Alcohol use, unspecified with withdrawal, unspecified: Secondary | ICD-10-CM | POA: Diagnosis not present

## 2021-02-07 DIAGNOSIS — N401 Enlarged prostate with lower urinary tract symptoms: Secondary | ICD-10-CM | POA: Diagnosis present

## 2021-02-07 DIAGNOSIS — F1910 Other psychoactive substance abuse, uncomplicated: Secondary | ICD-10-CM | POA: Diagnosis present

## 2021-02-07 DIAGNOSIS — H269 Unspecified cataract: Secondary | ICD-10-CM | POA: Diagnosis present

## 2021-02-07 DIAGNOSIS — Z91018 Allergy to other foods: Secondary | ICD-10-CM

## 2021-02-07 DIAGNOSIS — Z1389 Encounter for screening for other disorder: Secondary | ICD-10-CM

## 2021-02-07 DIAGNOSIS — Z9151 Personal history of suicidal behavior: Secondary | ICD-10-CM

## 2021-02-07 DIAGNOSIS — F32A Depression, unspecified: Secondary | ICD-10-CM | POA: Diagnosis not present

## 2021-02-07 DIAGNOSIS — F1024 Alcohol dependence with alcohol-induced mood disorder: Secondary | ICD-10-CM | POA: Diagnosis not present

## 2021-02-07 DIAGNOSIS — H5702 Anisocoria: Secondary | ICD-10-CM

## 2021-02-07 DIAGNOSIS — Z5902 Unsheltered homelessness: Secondary | ICD-10-CM | POA: Diagnosis not present

## 2021-02-07 LAB — I-STAT CHEM 8, ED
BUN: 18 mg/dL (ref 8–23)
Calcium, Ion: 1.1 mmol/L — ABNORMAL LOW (ref 1.15–1.40)
Chloride: 106 mmol/L (ref 98–111)
Creatinine, Ser: 0.9 mg/dL (ref 0.61–1.24)
Glucose, Bld: 123 mg/dL — ABNORMAL HIGH (ref 70–99)
HCT: 42 % (ref 39.0–52.0)
Hemoglobin: 14.3 g/dL (ref 13.0–17.0)
Potassium: 4.5 mmol/L (ref 3.5–5.1)
Sodium: 139 mmol/L (ref 135–145)
TCO2: 21 mmol/L — ABNORMAL LOW (ref 22–32)

## 2021-02-07 LAB — COMPREHENSIVE METABOLIC PANEL
ALT: 65 U/L — ABNORMAL HIGH (ref 0–44)
AST: 199 U/L — ABNORMAL HIGH (ref 15–41)
Albumin: 3.6 g/dL (ref 3.5–5.0)
Alkaline Phosphatase: 72 U/L (ref 38–126)
Anion gap: 16 — ABNORMAL HIGH (ref 5–15)
BUN: 14 mg/dL (ref 8–23)
CO2: 18 mmol/L — ABNORMAL LOW (ref 22–32)
Calcium: 9.2 mg/dL (ref 8.9–10.3)
Chloride: 103 mmol/L (ref 98–111)
Creatinine, Ser: 1.14 mg/dL (ref 0.61–1.24)
GFR, Estimated: >60 mL/min (ref >60)
Glucose, Bld: 120 mg/dL — ABNORMAL HIGH (ref 70–99)
Potassium: 4.4 mmol/L (ref 3.5–5.1)
Sodium: 137 mmol/L (ref 135–145)
Total Bilirubin: 2.6 mg/dL — ABNORMAL HIGH (ref 0.3–1.2)
Total Protein: 7.9 g/dL (ref 6.5–8.1)

## 2021-02-07 LAB — DIFFERENTIAL
Abs Immature Granulocytes: 0.03 10*3/uL (ref 0.00–0.07)
Basophils Absolute: 0.1 10*3/uL (ref 0.0–0.1)
Basophils Relative: 1 %
Eosinophils Absolute: 0 10*3/uL (ref 0.0–0.5)
Eosinophils Relative: 0 %
Immature Granulocytes: 0 %
Lymphocytes Relative: 9 %
Lymphs Abs: 1.2 10*3/uL (ref 0.7–4.0)
Monocytes Absolute: 0.8 10*3/uL (ref 0.1–1.0)
Monocytes Relative: 6 %
Neutro Abs: 11.2 10*3/uL — ABNORMAL HIGH (ref 1.7–7.7)
Neutrophils Relative %: 84 %

## 2021-02-07 LAB — RAPID URINE DRUG SCREEN, HOSP PERFORMED
Amphetamines: NOT DETECTED
Barbiturates: NOT DETECTED
Benzodiazepines: NOT DETECTED
Cocaine: NOT DETECTED
Opiates: NOT DETECTED
Tetrahydrocannabinol: NOT DETECTED

## 2021-02-07 LAB — URINALYSIS, ROUTINE W REFLEX MICROSCOPIC
Bacteria, UA: NONE SEEN
Bilirubin Urine: NEGATIVE
Glucose, UA: NEGATIVE mg/dL
Ketones, ur: 20 mg/dL — AB
Leukocytes,Ua: NEGATIVE
Nitrite: NEGATIVE
Protein, ur: 30 mg/dL — AB
Specific Gravity, Urine: 1.026 (ref 1.005–1.030)
pH: 5 (ref 5.0–8.0)

## 2021-02-07 LAB — ETHANOL: Alcohol, Ethyl (B): <10 mg/dL (ref <10)

## 2021-02-07 LAB — APTT: aPTT: 22 seconds — ABNORMAL LOW (ref 24–36)

## 2021-02-07 LAB — RESP PANEL BY RT-PCR (FLU A&B, COVID) ARPGX2
Influenza A by PCR: NEGATIVE
Influenza B by PCR: NEGATIVE
SARS Coronavirus 2 by RT PCR: NEGATIVE

## 2021-02-07 LAB — CBC
HCT: 42.1 % (ref 39.0–52.0)
Hemoglobin: 13.1 g/dL (ref 13.0–17.0)
MCH: 24.5 pg — ABNORMAL LOW (ref 26.0–34.0)
MCHC: 31.1 g/dL (ref 30.0–36.0)
MCV: 78.7 fL — ABNORMAL LOW (ref 80.0–100.0)
Platelets: 171 10*3/uL (ref 150–400)
RBC: 5.35 MIL/uL (ref 4.22–5.81)
RDW: 19 % — ABNORMAL HIGH (ref 11.5–15.5)
WBC: 13.3 10*3/uL — ABNORMAL HIGH (ref 4.0–10.5)
nRBC: 0 % (ref 0.0–0.2)

## 2021-02-07 LAB — VITAMIN B12: Vitamin B-12: 215 pg/mL (ref 180–914)

## 2021-02-07 LAB — PROTIME-INR
INR: 1.2 (ref 0.8–1.2)
Prothrombin Time: 14.7 seconds (ref 11.4–15.2)

## 2021-02-07 LAB — CBG MONITORING, ED: Glucose-Capillary: 119 mg/dL — ABNORMAL HIGH (ref 70–99)

## 2021-02-07 LAB — AMMONIA: Ammonia: 30 umol/L (ref 9–35)

## 2021-02-07 MED ORDER — FOLIC ACID 1 MG PO TABS
1.0000 mg | ORAL_TABLET | Freq: Every day | ORAL | Status: DC
  Administered 2021-02-08 – 2021-02-11 (×4): 1 mg via ORAL
  Filled 2021-02-07 (×5): qty 1

## 2021-02-07 MED ORDER — VITAMIN B-12 1000 MCG PO TABS
1000.0000 ug | ORAL_TABLET | Freq: Every day | ORAL | Status: DC
  Administered 2021-02-08: 1000 ug via ORAL
  Filled 2021-02-07 (×2): qty 1

## 2021-02-07 MED ORDER — CYANOCOBALAMIN 1000 MCG/ML IJ SOLN
1000.0000 ug | Freq: Once | INTRAMUSCULAR | Status: AC
  Administered 2021-02-07: 1000 ug via INTRAMUSCULAR
  Filled 2021-02-07: qty 1

## 2021-02-07 MED ORDER — THIAMINE HCL 100 MG/ML IJ SOLN
500.0000 mg | Freq: Three times a day (TID) | INTRAVENOUS | Status: DC
  Administered 2021-02-07 – 2021-02-08 (×2): 500 mg via INTRAVENOUS
  Filled 2021-02-07 (×5): qty 5

## 2021-02-07 MED ORDER — THIAMINE HCL 100 MG PO TABS: 100.0000 mg | ORAL_TABLET | Freq: Every day | ORAL | Status: DC

## 2021-02-07 MED ORDER — THIAMINE HCL 100 MG/ML IJ SOLN: 100.0000 mg | Freq: Every day | INTRAMUSCULAR | Status: DC

## 2021-02-07 MED ORDER — IOHEXOL 300 MG/ML  SOLN
100.0000 mL | Freq: Once | INTRAMUSCULAR | Status: AC | PRN
  Administered 2021-02-07: 100 mL via INTRAVENOUS

## 2021-02-07 MED ORDER — SODIUM CHLORIDE 0.9% FLUSH
3.0000 mL | Freq: Once | INTRAVENOUS | Status: AC
  Administered 2021-02-07: 3 mL via INTRAVENOUS

## 2021-02-07 MED ORDER — LORAZEPAM 2 MG/ML IJ SOLN
1.0000 mg | INTRAMUSCULAR | Status: AC | PRN
  Administered 2021-02-07: 2 mg via INTRAVENOUS
  Filled 2021-02-07: qty 1

## 2021-02-07 MED ORDER — ENOXAPARIN SODIUM 40 MG/0.4ML ~~LOC~~ SOLN
40.0000 mg | Freq: Every day | SUBCUTANEOUS | Status: DC
  Administered 2021-02-08 – 2021-02-11 (×4): 40 mg via SUBCUTANEOUS
  Filled 2021-02-07 (×5): qty 0.4

## 2021-02-07 MED ORDER — ACETAMINOPHEN 325 MG PO TABS
325.0000 mg | ORAL_TABLET | Freq: Once | ORAL | Status: AC
  Administered 2021-02-07: 325 mg via ORAL
  Filled 2021-02-07: qty 1

## 2021-02-07 MED ORDER — ADULT MULTIVITAMIN W/MINERALS CH
1.0000 | ORAL_TABLET | Freq: Every day | ORAL | Status: DC
  Administered 2021-02-08 – 2021-02-11 (×4): 1 via ORAL
  Filled 2021-02-07 (×5): qty 1

## 2021-02-07 MED ORDER — LORAZEPAM 1 MG PO TABS
1.0000 mg | ORAL_TABLET | ORAL | Status: AC | PRN
  Administered 2021-02-08: 3 mg via ORAL
  Administered 2021-02-08: 1 mg via ORAL
  Filled 2021-02-07: qty 3
  Filled 2021-02-07: qty 1

## 2021-02-07 MED ORDER — ACETAMINOPHEN 650 MG RE SUPP: 650.0000 mg | Freq: Once | RECTAL | Status: DC

## 2021-02-07 MED ORDER — POLYETHYLENE GLYCOL 3350 17 G PO PACK: 17.0000 g | PACK | Freq: Every day | ORAL | Status: DC | PRN
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
## 2021-02-07 NOTE — ED Notes (Signed)
Patient transported to MRI 
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
## 2021-02-07 NOTE — ED Notes (Signed)
CODE called off- Dr. Dierdre Harness neuro
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
## 2021-02-08 ENCOUNTER — Inpatient Hospital Stay (HOSPITAL_COMMUNITY): Payer: Medicaid Other

## 2021-02-08 DIAGNOSIS — L739 Follicular disorder, unspecified: Secondary | ICD-10-CM

## 2021-02-08 DIAGNOSIS — F1024 Alcohol dependence with alcohol-induced mood disorder: Secondary | ICD-10-CM

## 2021-02-08 DIAGNOSIS — F332 Major depressive disorder, recurrent severe without psychotic features: Secondary | ICD-10-CM

## 2021-02-08 DIAGNOSIS — F419 Anxiety disorder, unspecified: Secondary | ICD-10-CM

## 2021-02-08 DIAGNOSIS — F10139 Alcohol abuse with withdrawal, unspecified: Secondary | ICD-10-CM

## 2021-02-08 LAB — COMPREHENSIVE METABOLIC PANEL
ALT: 57 U/L — ABNORMAL HIGH (ref 0–44)
AST: 190 U/L — ABNORMAL HIGH (ref 15–41)
Albumin: 3.3 g/dL — ABNORMAL LOW (ref 3.5–5.0)
Alkaline Phosphatase: 63 U/L (ref 38–126)
Anion gap: 11 (ref 5–15)
BUN: 15 mg/dL (ref 8–23)
CO2: 21 mmol/L — ABNORMAL LOW (ref 22–32)
Calcium: 8.8 mg/dL — ABNORMAL LOW (ref 8.9–10.3)
Chloride: 106 mmol/L (ref 98–111)
Creatinine, Ser: 1.01 mg/dL (ref 0.61–1.24)
GFR, Estimated: >60 mL/min (ref >60)
Glucose, Bld: 110 mg/dL — ABNORMAL HIGH (ref 70–99)
Potassium: 4.3 mmol/L (ref 3.5–5.1)
Sodium: 138 mmol/L (ref 135–145)
Total Bilirubin: 2 mg/dL — ABNORMAL HIGH (ref 0.3–1.2)
Total Protein: 7.4 g/dL (ref 6.5–8.1)

## 2021-02-08 LAB — CK
Total CK: 4700 U/L — ABNORMAL HIGH (ref 49–397)
Total CK: 5120 U/L — ABNORMAL HIGH (ref 49–397)
Total CK: 3015 U/L — ABNORMAL HIGH (ref 49–397)

## 2021-02-08 LAB — CBC
HCT: 38.8 % — ABNORMAL LOW (ref 39.0–52.0)
Hemoglobin: 11.9 g/dL — ABNORMAL LOW (ref 13.0–17.0)
MCH: 24.2 pg — ABNORMAL LOW (ref 26.0–34.0)
MCHC: 30.7 g/dL (ref 30.0–36.0)
MCV: 79 fL — ABNORMAL LOW (ref 80.0–100.0)
Platelets: 152 10*3/uL (ref 150–400)
RBC: 4.91 MIL/uL (ref 4.22–5.81)
RDW: 18.8 % — ABNORMAL HIGH (ref 11.5–15.5)
WBC: 12 10*3/uL — ABNORMAL HIGH (ref 4.0–10.5)
nRBC: 0 % (ref 0.0–0.2)

## 2021-02-08 LAB — BILIRUBIN, DIRECT: Bilirubin, Direct: 0.4 mg/dL — ABNORMAL HIGH (ref 0.0–0.2)

## 2021-02-08 LAB — IRON AND TIBC
Iron: 47 ug/dL (ref 45–182)
Saturation Ratios: 10 % — ABNORMAL LOW (ref 17.9–39.5)
TIBC: 451 ug/dL — ABNORMAL HIGH (ref 250–450)
UIBC: 404 ug/dL

## 2021-02-08 LAB — RPR: RPR Ser Ql: NONREACTIVE

## 2021-02-08 LAB — TROPONIN I (HIGH SENSITIVITY)
Troponin I (High Sensitivity): 10 ng/L (ref <18)
Troponin I (High Sensitivity): 12 ng/L (ref <18)

## 2021-02-08 LAB — LACTIC ACID, PLASMA
Lactic Acid, Venous: 1.4 mmol/L (ref 0.5–1.9)
Lactic Acid, Venous: 2.5 mmol/L (ref 0.5–1.9)
Lactic Acid, Venous: 2.5 mmol/L (ref 0.5–1.9)
Lactic Acid, Venous: 1.2 mmol/L (ref 0.5–1.9)

## 2021-02-08 LAB — VITAMIN B12: Vitamin B-12: 3817 pg/mL — ABNORMAL HIGH (ref 180–914)

## 2021-02-08 LAB — FERRITIN: Ferritin: 18 ng/mL — ABNORMAL LOW (ref 24–336)

## 2021-02-08 LAB — HIV ANTIBODY (ROUTINE TESTING W REFLEX): HIV Screen 4th Generation wRfx: NONREACTIVE

## 2021-02-08 LAB — MAGNESIUM: Magnesium: 1.9 mg/dL (ref 1.7–2.4)

## 2021-02-08 MED ORDER — SODIUM CHLORIDE 0.9 % IV BOLUS
500.0000 mL | Freq: Once | INTRAVENOUS | Status: AC
  Administered 2021-02-08: 500 mL via INTRAVENOUS

## 2021-02-08 MED ORDER — CHLORHEXIDINE GLUCONATE CLOTH 2 % EX PADS
6.0000 | MEDICATED_PAD | Freq: Every day | CUTANEOUS | Status: DC
  Administered 2021-02-09 – 2021-02-10 (×3): 6 via TOPICAL

## 2021-02-08 MED ORDER — CHLORDIAZEPOXIDE HCL 25 MG PO CAPS
50.0000 mg | ORAL_CAPSULE | Freq: Three times a day (TID) | ORAL | Status: DC
  Administered 2021-02-08 – 2021-02-09 (×3): 50 mg via ORAL
  Filled 2021-02-08 (×4): qty 2

## 2021-02-08 MED ORDER — PANTOPRAZOLE SODIUM 40 MG PO TBEC
40.0000 mg | DELAYED_RELEASE_TABLET | Freq: Every day | ORAL | Status: DC
  Administered 2021-02-08 – 2021-02-11 (×4): 40 mg via ORAL
  Filled 2021-02-08 (×5): qty 1

## 2021-02-08 MED ORDER — LACTATED RINGERS IV SOLN: INTRAVENOUS | Status: AC

## 2021-02-08 MED ORDER — LACTATED RINGERS IV BOLUS
1000.0000 mL | Freq: Once | INTRAVENOUS | Status: AC
  Administered 2021-02-08: 1000 mL via INTRAVENOUS

## 2021-02-08 MED ORDER — ARIPIPRAZOLE 5 MG PO TABS
5.0000 mg | ORAL_TABLET | Freq: Every day | ORAL | Status: DC
  Administered 2021-02-08: 5 mg via ORAL
  Filled 2021-02-08 (×2): qty 1

## 2021-02-08 MED ORDER — BENZOYL PEROXIDE 5 % EX GEL
1.0000 "application " | Freq: Two times a day (BID) | CUTANEOUS | Status: DC
  Administered 2021-02-08 – 2021-02-11 (×6): 1 via TOPICAL
  Filled 2021-02-08: qty 42.5

## 2021-02-08 MED ORDER — LACTULOSE 10 GM/15ML PO SOLN
20.0000 g | Freq: Two times a day (BID) | ORAL | Status: DC
  Administered 2021-02-08 – 2021-02-11 (×6): 20 g via ORAL
  Filled 2021-02-08 (×6): qty 30

## 2021-02-08 NOTE — Progress Notes (Addendum)
Neurology Progress Note:   Brief HPI:  Neurology consulted on patient 02/07/21 for code stroke with symptoms of OS pupil dilation and confusion. tPA was not given due to patient being outside the tPA window and recent abdominal surgery 12/01/20. CTH was negative for any acute finding. Neurology recommended metabolic/infectious workup for confusion and patient was admitted by medicine.   Since code stroke, it was learned that patient has chronic OS dilation with non reactive pupil since eye trauma 2011.   B12 and Thiamine supplementation was begun due to patient's known ETOH abuse and a B12 level of only 215.    Today, he is having no visual disturbances, no HA, no weakness/numbness of face or body, no speech disturbance-new to him, and no confusion now. He states he does not remember yesterday, but says his "thinking" is back to baseline. His biggest concern is his rash and whether he has an infection.   O: Current vital signs: BP (!) 151/90   Pulse (!) 106   Temp 98.2 F (36.8 C) (Oral)   Resp 19   Ht 5\' 8"  (1.727 m)   Wt 112 kg   SpO2 98%   BMI 37.56 kg/m  Vital signs in last 24 hours: Temp:  [97.6 F (36.4 C)-100.7 F (38.2 C)] 98.2 F (36.8 C) (04/25 0737) Pulse Rate:  [87-108] 106 (04/25 0731) Resp:  [16-20] 19 (04/25 0731) BP: (137-173)/(62-90) 151/90 (04/25 0731) SpO2:  [95 %-100 %] 98 % (04/25 0731) Weight:  10[birth year]02-1990 kg] 112 kg (04/25 0727)  GENERAL: Awake, alert in NAD. Disheveled.  HEENT: Normocephalic and atraumatic LUNGS: Normal respiratory effort.  CV: RRR  ABDOMEN: scarring from previous surgery Ext: warm   NEURO:  Mental Status: AA&Ox3  Speech/Language: speech is without aphasia or dysarthria.  Naming, repetition, fluency, and comprehension intact.  Cranial Nerves:  II: OD 56mm and reactive. OS 42mm and non reactive. III, IV, VI: EOMI. Eyelids elevate symmetrically.  V: Sensation is intact to light touch and symmetrical to face.  VII: Smile is symmetrical.   VIII: hearing intact to voice. IX, X: Palate elevates symmetrically. Phonation is normal.  4m shrug 5/5. XII: tongue is midline without fasciculations. Motor: 5/5 strength to all muscle groups tested. He has difficulty lifting legs off the bed because of pain, but his strength is 5/5. Can lift left higher than right.  Tone: is normal and bulk is increased Sensation- Intact to light touch bilaterally. Extinction absent to light touch to DSS.    Coordination: FTN intact bilaterally. No drift.  DTRs: 2+ bilateral patella, brachioradialis. Gait- deferred  Medications  Current Facility-Administered Medications:  .  enoxaparin (LOVENOX) injection 40 mg, 40 mg, Subcutaneous, Daily, Masoudi, Elhamalsadat, MD .  folic acid (FOLVITE) tablet 1 mg, 1 mg, Oral, Daily, Masoudi, Elhamalsadat, MD .  lactated ringers infusion, , Intravenous, Continuous, Masoudi, Elhamalsadat, MD .  LORazepam (ATIVAN) tablet 1-4 mg, 1-4 mg, Oral, Q1H PRN **OR** LORazepam (ATIVAN) injection 1-4 mg, 1-4 mg, Intravenous, Q1H PRN, Masoudi, Elhamalsadat, MD, 2 mg at 02/07/21 2345 .  multivitamin with minerals tablet 1 tablet, 1 tablet, Oral, Daily, Masoudi, Elhamalsadat, MD .  pantoprazole (PROTONIX) EC tablet 40 mg, 40 mg, Oral, Daily, Masoudi, Elhamalsadat, MD .  polyethylene glycol (MIRALAX / GLYCOLAX) packet 17 g, 17 g, Oral, Daily PRN, Masoudi, Elhamalsadat, MD .  02/09/21 ON 02/11/2021] thiamine tablet 100 mg, 100 mg, Oral, Daily **OR** [START ON 02/11/2021] thiamine (B-1) injection 100 mg, 100 mg, Intravenous, Daily, 02/13/2021, MD .  thiamine  500mg  in normal saline (54ml) IVPB, 500 mg, Intravenous, Q8H, Bhagat, Srishti L, MD, Last Rate: 100 mL/hr at 02/08/21 0653, 500 mg at 02/08/21 0653 .  [COMPLETED] cyanocobalamin ((VITAMIN B-12)) injection 1,000 mcg, 1,000 mcg, Intramuscular, Once, 1,000 mcg at 02/07/21 2118 **FOLLOWED BY** vitamin B-12 (CYANOCOBALAMIN) tablet 1,000 mcg, 1,000 mcg, Oral, Daily, Bhagat,  Srishti L, MD  Pertinent Labs B12  215    CK 4700   Imaging MD has reviewed images in epic and the results pertinent to this consultation are:  CT Head 1. No evidence of acute intracranial abnormality. 2. Mild chronic small vessel ischemic disease. 3. Unchanged left temporal encephalomalacia.  MRI Brain, MRA head  1. Motion degraded examination. 2. No acute intracranial abnormality. 3. Normal intracranial MRA. 4. 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  Assessment: 65 yo male with history of ETOH abuse with cirrhosis and Hepatitis C, HTN, depression, self inflicted injuries, suicide attempts, who presented 02/07/21 as a code stroke due to his confusion and anisocoria. CTH was negative for acute findings. MRI brain and MRA head was negative for acute findings or intracranial stenosis or occlusion. After patient was oriented, he shared that his left pupil has been larger than right and non reactive since a trauma to the left eye and surgery in 2011. Given that and his return to his baseline mental status, no further neurological workup is suggested.   Impression: 1. AMS, resolved. 2. Anisocoria, due to past eye injury and not acutely found issue.  3. Metabolic derangements.  4. ETOH abuse.  Plan/Recommendations: 1. Continue Vit B12 and Thiamine supplementation here and on discharge.  2. ETOH-CIWA protocol in place. Last drink 02/07/21.  3. Continue to workup metabolic and infectious etiologies as you are doing.  4. No further neurological workup needed.  5. Suggest case manager for medication attainment and discharge planning if not already involved.   Neurology will be available for questions.   Pt seen by 02/09/21, MSN, APN-BC/Nurse Practitioner/Neurology.  Pager: Jimmye Norman

## 2021-02-08 NOTE — Progress Notes (Signed)
Unable to complete MRI of head and neck pt uncooperative, limited images only.
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
## 2021-02-08 NOTE — TOC CAGE-AID Note (Signed)
Transition of Care [facility]) - CAGE-AID Screening   Patient Details  Name: [name] MRN: [id number] Date of Birth: [birth year]  Transition of Care [facility]) CM/SW Contact:    Kermit Balo, RN Phone Number: 02/08/2021, 2:04 PM   Clinical Narrative: Patient offered inpatient and outpatient drug/ alcohol counseling resources and pt refused.    CAGE-AID Screening:    Have You Ever Felt You Ought to Cut Down on Your Drinking or Drug Use?: No Have People Annoyed You By Critizing Your Drinking Or Drug Use?: No Have You Felt Bad Or Guilty About Your Drinking Or Drug Use?: No Have You Ever Had a Drink or Used Drugs First Thing In The Morning to Steady Your Nerves or to Get Rid of a Hangover?: No CAGE-AID Score: 0  Substance Abuse Education Offered: Yes (patient refused)
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
## 2021-02-08 NOTE — TOC Progression Note (Signed)
Transition of Care [facility]) - Progression Note    Patient Details  Name: [name] MRN: [id number] Date of Birth: 01-04-1956  Transition of Care [facility]) CM/SW [place], [place] Phone Number: 02/08/2021, 2:52 PM  Clinical Narrative:     CSW met with pt for SNF and substance use consult. Pt provides some history but his timelines are unclear. He states he has been living in a tent for the past 3 weeks. When asked where he lived before that he stated that he had been in prison for 11 years. It is unclear when pt was released from prison because he also describes other periods of time where he was intermittently "living" in hospitals and in a tent. Pt states "I just want to go to [place]." When asked why, he explains that they have better medical treatment there. CSW explains that SNF options would be limited due to his care needs and insurance and that SNF may not be able to be secured but pt is agreeable to go to SNF if one would accept him. CSW also inquired about pt's alcohol use. He is nonchalant about his use. He does report getting some substance use treatment in the 1970's. He is vague when talking about his alcohol use currently. He does accept a list of resources for substance use tx and information.   Expected Discharge Plan: Home/Self Care Barriers to Discharge: Homeless with medical needs,Continued Medical Work up  Expected Discharge Plan and Services Expected Discharge Plan: Home/Self Care   Discharge Planning Services: CM Consult   Living arrangements for the past 2 months: Homeless                                       Social Determinants of Health (SDOH) Interventions    Readmission Risk Interventions No flowsheet data found.

## 2021-02-09 DIAGNOSIS — K7460 Unspecified cirrhosis of liver: Secondary | ICD-10-CM

## 2021-02-09 LAB — COMPREHENSIVE METABOLIC PANEL
ALT: 47 U/L — ABNORMAL HIGH (ref 0–44)
AST: 123 U/L — ABNORMAL HIGH (ref 15–41)
Albumin: 2.7 g/dL — ABNORMAL LOW (ref 3.5–5.0)
Alkaline Phosphatase: 52 U/L (ref 38–126)
Anion gap: 7 (ref 5–15)
BUN: 12 mg/dL (ref 8–23)
CO2: 25 mmol/L (ref 22–32)
Calcium: 8.5 mg/dL — ABNORMAL LOW (ref 8.9–10.3)
Chloride: 104 mmol/L (ref 98–111)
Creatinine, Ser: 0.94 mg/dL (ref 0.61–1.24)
GFR, Estimated: >60 mL/min (ref >60)
Glucose, Bld: 97 mg/dL (ref 70–99)
Potassium: 3.6 mmol/L (ref 3.5–5.1)
Sodium: 136 mmol/L (ref 135–145)
Total Bilirubin: 1.6 mg/dL — ABNORMAL HIGH (ref 0.3–1.2)
Total Protein: 6.3 g/dL — ABNORMAL LOW (ref 6.5–8.1)

## 2021-02-09 LAB — CBC WITH DIFFERENTIAL/PLATELET
Abs Immature Granulocytes: 0.02 10*3/uL (ref 0.00–0.07)
Basophils Absolute: 0 10*3/uL (ref 0.0–0.1)
Basophils Relative: 0 %
Eosinophils Absolute: 0.2 10*3/uL (ref 0.0–0.5)
Eosinophils Relative: 3 %
HCT: 33.8 % — ABNORMAL LOW (ref 39.0–52.0)
Hemoglobin: 10.6 g/dL — ABNORMAL LOW (ref 13.0–17.0)
Immature Granulocytes: 0 %
Lymphocytes Relative: 25 %
Lymphs Abs: 1.8 10*3/uL (ref 0.7–4.0)
MCH: 24.3 pg — ABNORMAL LOW (ref 26.0–34.0)
MCHC: 31.4 g/dL (ref 30.0–36.0)
MCV: 77.3 fL — ABNORMAL LOW (ref 80.0–100.0)
Monocytes Absolute: 0.6 10*3/uL (ref 0.1–1.0)
Monocytes Relative: 9 %
Neutro Abs: 4.4 10*3/uL (ref 1.7–7.7)
Neutrophils Relative %: 63 %
Platelets: 97 10*3/uL — ABNORMAL LOW (ref 150–400)
RBC: 4.37 MIL/uL (ref 4.22–5.81)
RDW: 18.4 % — ABNORMAL HIGH (ref 11.5–15.5)
WBC: 7.1 10*3/uL (ref 4.0–10.5)
nRBC: 0 % (ref 0.0–0.2)

## 2021-02-09 LAB — LITHIUM LEVEL: Lithium Lvl: <0.06 mmol/L — ABNORMAL LOW (ref 0.60–1.20)

## 2021-02-09 LAB — CK: Total CK: 2362 U/L — ABNORMAL HIGH (ref 49–397)

## 2021-02-09 MED ORDER — CHLORDIAZEPOXIDE HCL 25 MG PO CAPS
50.0000 mg | ORAL_CAPSULE | Freq: Two times a day (BID) | ORAL | Status: DC
  Administered 2021-02-09: 50 mg via ORAL
  Filled 2021-02-09: qty 2

## 2021-02-09 MED ORDER — ARIPIPRAZOLE 5 MG PO TABS
15.0000 mg | ORAL_TABLET | Freq: Every day | ORAL | Status: DC
  Administered 2021-02-09 – 2021-02-10 (×2): 15 mg via ORAL
  Filled 2021-02-09 (×3): qty 1

## 2021-02-09 MED ORDER — ACETAMINOPHEN 325 MG PO TABS
650.0000 mg | ORAL_TABLET | Freq: Four times a day (QID) | ORAL | Status: DC
  Administered 2021-02-09 – 2021-02-11 (×7): 650 mg via ORAL
  Filled 2021-02-09 (×7): qty 2

## 2021-02-09 MED ORDER — THIAMINE HCL 100 MG PO TABS
100.0000 mg | ORAL_TABLET | Freq: Every day | ORAL | Status: DC
  Administered 2021-02-09 – 2021-02-11 (×3): 100 mg via ORAL
  Filled 2021-02-09 (×3): qty 1

## 2021-02-09 MED ORDER — LIDOCAINE 5 % EX PTCH
1.0000 | MEDICATED_PATCH | CUTANEOUS | Status: DC
  Administered 2021-02-09: 1 via TRANSDERMAL
  Filled 2021-02-09 (×2): qty 1

## 2021-02-09 NOTE — Progress Notes (Signed)
       RE:   [name]    Date of Birth:   Apr 02, 1956   Date:   02/09/2021      To Whom It May Concern:  Please be advised that the above-named patient will require a short-term nursing home stay - anticipated 30 days or less for rehabilitation and strengthening.  The plan is for return home.

## 2021-02-09 NOTE — Consult Note (Signed)
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   Eosinophils Absolute 0.2 0.0 - 0.5 K/uL   Basophils Relative 0 %   Basophils Absolute 0.0 0.0 - 0.1 K/uL   Immature Granulocytes 0 %   Abs Immature Granulocytes 0.02 0.00 - 0.07 K/uL    Comment: Performed at [facility], [address], [place], [place] [address]  CK     Status: Abnormal   Collection Time: 02/09/21  6:18 AM  Result Value Ref Range   Total  CK 2,362 (H) 49 - 397 U/L    Comment: Performed at [facility], [address] [place], [place] [address]    Current Facility-Administered Medications  Medication Dose Route Frequency Provider Last Rate Last Admin  . acetaminophen (TYLENOL) tablet 650 mg  650 mg Oral QID Alphonzo Severance, MD      . ARIPiprazole (ABILIFY) tablet 5 mg  5 mg Oral QHS Karsten Ro, MD   5 mg at 02/08/21 2235  . benzoyl peroxide 5 % gel 1 application  1 application Topical BID Elige Radon, MD   1 application at 02/08/21 2237  . chlordiazePOXIDE (LIBRIUM) capsule 50 mg  50 mg Oral TID Alphonzo Severance, MD   50 mg at 02/09/21 0810  . Chlorhexidine Gluconate Cloth 2 % PADS 6 each  6 each Topical Daily Inez Catalina, MD   6 each at 02/09/21 1058  . enoxaparin (LOVENOX) injection 40 mg  40 mg Subcutaneous Daily Masoudi, Elhamalsadat, MD   40 mg at 02/09/21 0810  . folic acid (FOLVITE) tablet 1 mg  1 mg Oral Daily Masoudi, Elhamalsadat, MD   1 mg at 02/09/21 0810  . lactated ringers infusion   Intravenous Continuous Doran Stabler, DO 100 mL/hr at 02/09/21 1051 Rate Change at 02/09/21 1051  . lactulose (CHRONULAC) 10 GM/15ML solution 20 g  20 g Oral BID Ephriam Knuckles, Rylee, MD   20 g at 02/09/21 0814  . lidocaine (LIDODERM) 5 % 1 patch  1 patch Transdermal Q24H Alphonzo Severance, MD      . LORazepam (ATIVAN) tablet 1-4 mg  1-4 mg Oral Q1H PRN Masoudi, Elhamalsadat, MD   3 mg at 02/08/21 1541   Or  . LORazepam (ATIVAN) injection 1-4 mg  1-4 mg Intravenous Q1H PRN Masoudi, Elhamalsadat, MD   2 mg at 02/07/21 2345  . multivitamin with minerals tablet 1 tablet  1 tablet Oral Daily Masoudi, Elhamalsadat, MD   1 tablet at 02/09/21 0810  .  pantoprazole (PROTONIX) EC tablet 40 mg  40 mg Oral Daily Masoudi, Elhamalsadat, MD   40 mg at 02/09/21 0810  . polyethylene glycol (MIRALAX / GLYCOLAX) packet 17 g  17 g Oral Daily PRN Masoudi, Elhamalsadat, MD      . Melene Muller ON 02/11/2021] thiamine (B-1) injection 100 mg  100 mg Intravenous  Daily Inez Catalina, MD       Or  . Melene Muller ON 02/11/2021] thiamine tablet 100 mg  100 mg Oral Daily Inez Catalina, MD        Musculoskeletal: Strength & Muscle Tone: within normal limits Gait & Station: Deferred  Patient leans: N/A            Psychiatric Specialty Exam:  Presentation  General Appearance: Appropriate for Environment  Eye Contact:Fair  Speech:Clear and Coherent  Speech Volume:Normal  Handedness:Left   Mood and Affect  Mood:Depressed  Affect:Flat; Congruent   Thought Process  Thought Processes:Coherent  Descriptions of Associations:Intact  Orientation:Full (Time, Place and Person)  Thought Content:Logical  History of Schizophrenia/Schizoaffective disorder:No  Duration of Psychotic Symptoms:No data recorded Hallucinations:Hallucinations: Auditory Description of Auditory Hallucinations: Hears voices of people talking about his past  Ideas of Reference:None  Suicidal Thoughts:Suicidal Thoughts: No  Homicidal Thoughts:Homicidal Thoughts: No   Sensorium  Memory:Immediate Good; Recent Good; Remote Good  Judgment:Fair  Insight:Fair   Executive Functions  Concentration:Fair  Attention Span:Fair  Recall:Fair  Fund of Knowledge:Fair  Language:Fair   Psychomotor Activity  Psychomotor Activity:Psychomotor Activity: Restlessness   Assets  Assets:Desire for Improvement; Resilience   Sleep  Sleep:Sleep: Poor   Physical Exam: Physical Exam Vitals and nursing note reviewed.  Constitutional:      General: He is in acute distress.     Appearance: Normal appearance. He is obese. He is not ill-appearing.  HENT:     Head: Normocephalic.  Pulmonary:     Effort: Pulmonary effort is normal.  Neurological:     General: No focal deficit present.     Mental Status: He is alert and oriented to person, place, and time.    Review of Systems  Eyes: Negative for blurred vision.  Respiratory: Negative for cough.    Cardiovascular: Negative for chest pain.  Gastrointestinal: Negative for vomiting.  Musculoskeletal: Positive for myalgias.  Neurological: Negative for dizziness and headaches.  Psychiatric/Behavioral: Positive for depression, memory loss and substance abuse. Negative for hallucinations and suicidal ideas. The patient is nervous/anxious and has insomnia.    Blood pressure 138/72, pulse 86, temperature 98.8 F (37.1 C), temperature source Axillary, resp. rate 19, height  (1.727 m), weight 112 kg, SpO2 94 %. Body mass index is 37.56 kg/m.  Treatment Plan Summary: Plan Medication management Case discussed with Dr. Lucianne Muss.  Pt doesn't meet criteria for Inpatient hospitalization. He is not Suicidal, homicidal or psychotic.  -Continue Alcohol Detox with Ativan, librium ,Thiamin, Multivitamin and Folic acid. -Increase Abilify to 15 mg QHS.  -Lithium level ordered pending.  -Monitor for SI, HI, Hallucination.  -Due to Pt non compliance, we'll not start Lithium at this time. Pt can follow up at [facility] outpatient and Lithium can be started at follow up.  -Primary team can consider SNF placement options if he cannot take care of himself and qualifies for that. -Per Child psychotherapist note, patient agreed to go to SNF if one would accept him.  Pt was also offered inpatient and outpatient drug and alcohol counseling resources but patient refused. -Rest workup by Primary team.  -Psychiatry will sign off   -  For follow up-  Please come to [facility] (this facility) during walk in hours for appointment with psychiatrist for further medication management and for therapists for therapy.   Walk in hours are 8-11 AM Monday through Thursday for medication management.It is first come, first -serve; it is best to arrive by 7:00 AM. Child and adolescent psychiatrists are only available on Wednesdays and Thursdays during walk in hours. On Friday from 1 pm to 4 pm for therapy intake only.  Please arrive by 12:00 pm as it is  first come, first -serve.   When you arrive please go upstairs for your appointment. If you are unsure of where to go, inform the front desk that you are here for a walk in appointment and they will assist you with directions upstairs.  Address:  [address] in [place], [address] Ph: [phone]   Disposition: Patient does not meet criteria for psychiatric inpatient admission. Supportive therapy provided about ongoing stressors. Discussed crisis plan, support from social network, calling 911, coming to the Emergency Department, and calling Suicide Hotline.  Karsten Ro, MD 02/09/2021 11:49 AM
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
## 2021-02-10 DIAGNOSIS — F10939 Alcohol use, unspecified with withdrawal, unspecified: Secondary | ICD-10-CM

## 2021-02-10 DIAGNOSIS — M6282 Rhabdomyolysis: Secondary | ICD-10-CM

## 2021-02-10 DIAGNOSIS — G8929 Other chronic pain: Secondary | ICD-10-CM

## 2021-02-10 DIAGNOSIS — F32A Depression, unspecified: Secondary | ICD-10-CM

## 2021-02-10 DIAGNOSIS — M545 Low back pain, unspecified: Secondary | ICD-10-CM

## 2021-02-10 DIAGNOSIS — R339 Retention of urine, unspecified: Secondary | ICD-10-CM

## 2021-02-10 LAB — CBC WITH DIFFERENTIAL/PLATELET
Abs Immature Granulocytes: 0.03 10*3/uL (ref 0.00–0.07)
Basophils Absolute: 0 10*3/uL (ref 0.0–0.1)
Basophils Relative: 0 %
Eosinophils Absolute: 0.2 10*3/uL (ref 0.0–0.5)
Eosinophils Relative: 4 %
HCT: 32.5 % — ABNORMAL LOW (ref 39.0–52.0)
Hemoglobin: 10.1 g/dL — ABNORMAL LOW (ref 13.0–17.0)
Immature Granulocytes: 1 %
Lymphocytes Relative: 30 %
Lymphs Abs: 1.6 10*3/uL (ref 0.7–4.0)
MCH: 24.2 pg — ABNORMAL LOW (ref 26.0–34.0)
MCHC: 31.1 g/dL (ref 30.0–36.0)
MCV: 77.9 fL — ABNORMAL LOW (ref 80.0–100.0)
Monocytes Absolute: 0.5 10*3/uL (ref 0.1–1.0)
Monocytes Relative: 10 %
Neutro Abs: 3 10*3/uL (ref 1.7–7.7)
Neutrophils Relative %: 55 %
Platelets: 111 10*3/uL — ABNORMAL LOW (ref 150–400)
RBC: 4.17 MIL/uL — ABNORMAL LOW (ref 4.22–5.81)
RDW: 18.1 % — ABNORMAL HIGH (ref 11.5–15.5)
WBC: 5.3 10*3/uL (ref 4.0–10.5)
nRBC: 0 % (ref 0.0–0.2)

## 2021-02-10 LAB — COMPREHENSIVE METABOLIC PANEL
ALT: 42 U/L (ref 0–44)
AST: 102 U/L — ABNORMAL HIGH (ref 15–41)
Albumin: 2.5 g/dL — ABNORMAL LOW (ref 3.5–5.0)
Alkaline Phosphatase: 51 U/L (ref 38–126)
Anion gap: 5 (ref 5–15)
BUN: 9 mg/dL (ref 8–23)
CO2: 26 mmol/L (ref 22–32)
Calcium: 8.6 mg/dL — ABNORMAL LOW (ref 8.9–10.3)
Chloride: 106 mmol/L (ref 98–111)
Creatinine, Ser: 0.96 mg/dL (ref 0.61–1.24)
GFR, Estimated: >60 mL/min (ref >60)
Glucose, Bld: 100 mg/dL — ABNORMAL HIGH (ref 70–99)
Potassium: 3.4 mmol/L — ABNORMAL LOW (ref 3.5–5.1)
Sodium: 137 mmol/L (ref 135–145)
Total Bilirubin: 1.2 mg/dL (ref 0.3–1.2)
Total Protein: 5.9 g/dL — ABNORMAL LOW (ref 6.5–8.1)

## 2021-02-10 LAB — CK: Total CK: 1590 U/L — ABNORMAL HIGH (ref 49–397)

## 2021-02-10 LAB — GLUCOSE, CAPILLARY: Glucose-Capillary: 141 mg/dL — ABNORMAL HIGH (ref 70–99)

## 2021-02-10 MED ORDER — POTASSIUM CHLORIDE CRYS ER 20 MEQ PO TBCR
40.0000 meq | EXTENDED_RELEASE_TABLET | Freq: Once | ORAL | Status: AC
  Administered 2021-02-10: 40 meq via ORAL
  Filled 2021-02-10: qty 2

## 2021-02-10 MED ORDER — TAMSULOSIN HCL 0.4 MG PO CAPS
0.4000 mg | ORAL_CAPSULE | Freq: Every day | ORAL | Status: DC
  Administered 2021-02-10 – 2021-02-11 (×2): 0.4 mg via ORAL
  Filled 2021-02-10 (×2): qty 1

## 2021-02-10 MED ORDER — ENSURE MAX PROTEIN PO LIQD
11.0000 [oz_av] | Freq: Every day | ORAL | Status: DC
  Administered 2021-02-11: 11 [oz_av] via ORAL
  Filled 2021-02-10 (×2): qty 330

## 2021-02-10 MED ORDER — CHLORDIAZEPOXIDE HCL 25 MG PO CAPS
50.0000 mg | ORAL_CAPSULE | Freq: Once | ORAL | Status: AC
  Administered 2021-02-10: 50 mg via ORAL
  Filled 2021-02-10: qty 2

## 2021-02-10 NOTE — Progress Notes (Signed)
Patient's potassium 3.4. Telephone orders received to medicate with 40 mEq PO potassium x 1 dose. Will medicate as ordered and continue to monitor.

## 2021-02-10 NOTE — Progress Notes (Signed)
Physical Therapy Treatment Patient Details Name: [name] MRN: [id number] DOB: December 29, 1955 Today's Date: 02/10/2021    History of Present Illness [name] is a 65 y/o male who reported to ED after he was found outside confused and L pupil dilated. Pt found to have chronic OS dilation with nonreactive pupil. Admitted on 02/07/21 with AMS. Recent admission on 01/20/21 for suicidal behavior with attempted self-inflicted stab wound. PMH includes HTN, ongoing alcohol abuse, hep C with cirrhosis, frequent suicidal idealation with prior attempts, schizoid personality disorder, and recent laparotomy 12/01/20.    PT Comments    Pt sitting EoB requesting to go to bathroom. Pt reports he continues to have pain in R LE but has improved. Pt limited in safe mobility by R LE pain and decreased strength and endurance. Pt is min Ax2 for transfers and ambulation to and from bathroom. Pt request to get back to be, however with encouragement agreeable to sitting in chair. Pt reports he was not successful in bathroom and request RN give him his lactulose. RN notified. D/c plan remains appropriate at this time, however likely difficult placement and will be able to return to PLOF with increased mobility. PT will continue to follow acutely.    Follow Up Recommendations  SNF;Supervision for mobility/OOB     Equipment Recommendations  Rolling walker with 5" wheels       Precautions / Restrictions Precautions Precautions: Fall Restrictions Weight Bearing Restrictions: No    Mobility  Bed Mobility               General bed mobility comments: sitting EoB on entry    Transfers Overall transfer level: Needs assistance Equipment used: Rolling walker (2 wheeled) Transfers: Sit to/from Stand Sit to Stand: From elevated surface;+2 physical assistance;Min assist         General transfer comment: min Ax2 for power up and steadying in standing. vc for hand placement for power up min guard and use of  handrails in bathroom to power up from toilet  Ambulation/Gait Ambulation/Gait assistance: Min assist;+2 safety/equipment Gait Distance (Feet): 18 Feet Assistive device: Rolling walker (2 wheeled) Gait Pattern/deviations: Step-through pattern;Decreased step length - right;Decreased step length - left;Shuffle;Wide base of support;Trunk flexed Gait velocity: slowed Gait velocity interpretation: <1.31 ft/sec, indicative of household ambulator General Gait Details: minA for steadying with RW, vc for proximity to RW, and keeping both LE within RW, slowed waddling gait with decreased weightshift to R LE         Balance Overall balance assessment: Needs assistance Sitting-balance support: Feet supported;Bilateral upper extremity supported Sitting balance-Leahy Scale: Fair     Standing balance support: Bilateral upper extremity supported;During functional activity Standing balance-Leahy Scale: Poor Standing balance comment: Reliant on UE support and external assistance                            Cognition Arousal/Alertness: Awake/alert Behavior During Therapy: WFL for tasks assessed/performed;Impulsive Overall Cognitive Status: Impaired/Different from baseline Area of Impairment: Orientation;Attention;Memory;Following commands;Safety/judgement;Awareness;Problem solving                 Orientation Level: Disoriented to;Time Current Attention Level: Sustained Memory: Decreased short-term memory Following Commands: Follows one step commands with increased time;Follows multi-step commands with increased time Safety/Judgement: Decreased awareness of deficits;Decreased awareness of safety Awareness: Emergent Problem Solving: Slow processing;Difficulty sequencing;Requires verbal cues General Comments: requires cuing for safety, slowed processing and difficulty with sequencing RW use  General Comments General comments (skin integrity, edema, etc.): VSS on RA       Pertinent Vitals/Pain Pain Assessment: Faces Faces Pain Scale: Hurts a little bit Pain Location: back, R LE Pain Descriptors / Indicators: Grimacing;Guarding;Sore Pain Intervention(s): Limited activity within patient's tolerance;Monitored during session;Repositioned    Home Living Family/patient expects to be discharged to:: Shelter/Homeless               Additional Comments: Lives in a tent    Prior Function Level of Independence: Independent      Comments: Independent walking around 30 yards at a time.   PT Goals (current goals can now be found in the care plan section) Acute Rehab PT Goals Patient Stated Goal: figure out what is wrong with R leg PT Goal Formulation: With patient Time For Goal Achievement: 02/22/21 Potential to Achieve Goals: Good Progress towards PT goals: Progressing toward goals    Frequency    Min 3X/week      PT Plan Current plan remains appropriate       AM-PAC PT "6 Clicks" Mobility   Outcome Measure  Help needed turning from your back to your side while in a flat bed without using bedrails?: None Help needed moving from lying on your back to sitting on the side of a flat bed without using bedrails?: A Little Help needed moving to and from a bed to a chair (including a wheelchair)?: A Little Help needed standing up from a chair using your arms (e.g., wheelchair or bedside chair)?: A Little Help needed to walk in hospital room?: A Little Help needed climbing 3-5 steps with a railing? : A Lot 6 Click Score: 18    End of Session Equipment Utilized During Treatment: Gait belt Activity Tolerance: Patient limited by pain Patient left: in chair;with call bell/phone within reach;with chair alarm set Nurse Communication: Mobility status PT Visit Diagnosis: Unsteadiness on feet (R26.81);Muscle weakness (generalized) (M62.81)     Time: 7001-7494 PT Time Calculation (min) (ACUTE ONLY): 20 min  Charges:  $Gait Training: 8-22 mins                      Zamyra Allensworth B. Beverely Risen PT, DPT Acute Rehabilitation Services Pager [phone] Office [phone]    [facility] 02/10/2021, 10:48 AM
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
## 2021-02-10 NOTE — Progress Notes (Signed)
Patient requested his wallet. Gave patient his pants to look for his wallet from room closet. Patient found wallet in front pants pocket. Pants returned to closet and wallet is with patient on his bedside table.

## 2021-02-10 NOTE — TOC Progression Note (Addendum)
Transition of Care [facility]) - Progression Note    Patient Details  Name: [name] MRN: [id number] Date of Birth: 1956-04-13  Transition of Care [facility]) CM/SW [place], [place] Phone Number: 02/10/2021, 10:26 AM  Clinical Narrative:     CSW is informed by MD that pt is stating that he will be going back to prison instead of SNF for rehab. CSW met with pt to clarify disposition. Pt states that his parole officer plans to violate his parole. Pt states this has not happened already but that his parol officer spoke with pt's brother and has parole violation paperwork complete. Pt states that he thinks his parole officer will take him from the hospital to be incarcerated. Pt explains he needs to call his parole officer to discuss this; he does not have officer's phone number and requests his wallet to access parol officer number. He reports that he does not have his wallet in the room. CSW notifies charge RN that pt needs his wallet.   1100: CSW met with pt once he has received his wallet. He did not call his parol officer yet. CSW explained that no SNF facilities are accepting pt at this time and that likely discharge would be "home" or to a shelter. Pt explained his parol officer would pick him up to return to jail/prison upon discharge. He consents to [facility] contacting Investment banker, operational. Cell [phone] and office#[phone].   1210: CSW called pt's Biomedical scientist. She confirmed that pt has a warrant out for her arrest. CSW explained likely d/c tomorrow. Officer Linton Rump would be able to transport pt to jail/prision from hospital after 12pm.   1227: CSW called and left message with officer Linton Rump inquiring if pt would be going to jail or prison.    Expected Discharge Plan: Home/Self Care Barriers to Discharge: Homeless with medical needs,Continued Medical Work up  Expected Discharge Plan and Services Expected Discharge Plan: Home/Self Care   Discharge Planning Services: CM  Consult   Living arrangements for the past 2 months: Homeless                                       Social Determinants of Health (SDOH) Interventions    Readmission Risk Interventions No flowsheet data found.
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
## 2021-02-10 NOTE — Care Management (Signed)
Notified Chief Executive Officer at [facility] [phone] of patient's pending DC tomorrow morning to parole officer for confinement at [facility]. Victorino Dike confirms that patient is ok to come to jail with foley catheter and RW. Discussed current medications that he is on. Patient will neeed to bring AVS to jail with him.

## 2021-02-11 LAB — URINALYSIS, ROUTINE W REFLEX MICROSCOPIC
Bilirubin Urine: NEGATIVE
Glucose, UA: NEGATIVE mg/dL
Ketones, ur: NEGATIVE mg/dL
Leukocytes,Ua: NEGATIVE
Nitrite: NEGATIVE
Protein, ur: NEGATIVE mg/dL
Specific Gravity, Urine: 1.006 (ref 1.005–1.030)
pH: 8 (ref 5.0–8.0)

## 2021-02-11 LAB — COMPREHENSIVE METABOLIC PANEL
ALT: 53 U/L — ABNORMAL HIGH (ref 0–44)
AST: 113 U/L — ABNORMAL HIGH (ref 15–41)
Albumin: 3 g/dL — ABNORMAL LOW (ref 3.5–5.0)
Alkaline Phosphatase: 61 U/L (ref 38–126)
Anion gap: 8 (ref 5–15)
BUN: 10 mg/dL (ref 8–23)
CO2: 28 mmol/L (ref 22–32)
Calcium: 8.9 mg/dL (ref 8.9–10.3)
Chloride: 102 mmol/L (ref 98–111)
Creatinine, Ser: 0.99 mg/dL (ref 0.61–1.24)
GFR, Estimated: >60 mL/min (ref >60)
Glucose, Bld: 103 mg/dL — ABNORMAL HIGH (ref 70–99)
Potassium: 3.9 mmol/L (ref 3.5–5.1)
Sodium: 138 mmol/L (ref 135–145)
Total Bilirubin: 1 mg/dL (ref 0.3–1.2)
Total Protein: 7 g/dL (ref 6.5–8.1)

## 2021-02-11 LAB — CBC WITH DIFFERENTIAL/PLATELET
Abs Immature Granulocytes: 0.01 10*3/uL (ref 0.00–0.07)
Basophils Absolute: 0 10*3/uL (ref 0.0–0.1)
Basophils Relative: 0 %
Eosinophils Absolute: 0.2 10*3/uL (ref 0.0–0.5)
Eosinophils Relative: 5 %
HCT: 36.8 % — ABNORMAL LOW (ref 39.0–52.0)
Hemoglobin: 11.3 g/dL — ABNORMAL LOW (ref 13.0–17.0)
Immature Granulocytes: 0 %
Lymphocytes Relative: 24 %
Lymphs Abs: 1.2 10*3/uL (ref 0.7–4.0)
MCH: 24.2 pg — ABNORMAL LOW (ref 26.0–34.0)
MCHC: 30.7 g/dL (ref 30.0–36.0)
MCV: 78.8 fL — ABNORMAL LOW (ref 80.0–100.0)
Monocytes Absolute: 0.4 10*3/uL (ref 0.1–1.0)
Monocytes Relative: 8 %
Neutro Abs: 3.2 10*3/uL (ref 1.7–7.7)
Neutrophils Relative %: 63 %
Platelets: 130 10*3/uL — ABNORMAL LOW (ref 150–400)
RBC: 4.67 MIL/uL (ref 4.22–5.81)
RDW: 18.3 % — ABNORMAL HIGH (ref 11.5–15.5)
WBC: 5.1 10*3/uL (ref 4.0–10.5)
nRBC: 0 % (ref 0.0–0.2)

## 2021-02-11 MED ORDER — BENZOYL PEROXIDE 5 % EX GEL: 1.0000 "application " | Freq: Two times a day (BID) | CUTANEOUS | 0 refills | Status: DC

## 2021-02-11 MED ORDER — LIDOCAINE 5 % EX PTCH: 1.0000 | MEDICATED_PATCH | CUTANEOUS | 0 refills | Status: DC

## 2021-02-11 MED ORDER — ARIPIPRAZOLE 15 MG PO TABS: 15.0000 mg | ORAL_TABLET | Freq: Every day | ORAL | Status: DC

## 2021-02-11 MED ORDER — FERROUS SULFATE 325 (65 FE) MG PO TABS: 325.0000 mg | ORAL_TABLET | Freq: Every day | ORAL | 3 refills | Status: DC

## 2021-02-11 MED ORDER — ADULT MULTIVITAMIN W/MINERALS CH: 1.0000 | ORAL_TABLET | Freq: Every day | ORAL | Status: DC

## 2021-02-11 MED ORDER — LACTULOSE 10 GM/15ML PO SOLN: 20.0000 g | Freq: Two times a day (BID) | ORAL | 0 refills | Status: DC

## 2021-02-11 MED ORDER — THIAMINE HCL 100 MG PO TABS
100.0000 mg | ORAL_TABLET | Freq: Every day | ORAL | Status: DC
Start: 2021-02-11 — End: 2021-05-13

## 2021-02-11 MED ORDER — TAMSULOSIN HCL 0.4 MG PO CAPS: 0.4000 mg | ORAL_CAPSULE | Freq: Every day | ORAL | Status: DC

## 2021-02-11 MED ORDER — FOLIC ACID 1 MG PO TABS: 1.0000 mg | ORAL_TABLET | Freq: Every day | ORAL | Status: DC

## 2021-02-11 MED ORDER — ACETAMINOPHEN 325 MG PO TABS: 650.0000 mg | ORAL_TABLET | Freq: Four times a day (QID) | ORAL | Status: DC | PRN

## 2021-02-11 NOTE — TOC Transition Note (Signed)
Transition of Care [facility]) - CM/SW Discharge Note   Patient Details  Name: [name] MRN: [id number] Date of Birth: 29-Jun-1956  Transition of Care [facility]) CM/SW Contact:  Lockie Pares, RN Phone Number: 02/11/2021, 8:28 AM   Clinical Narrative:    Patient DC likely today, Will be picked up by [facility] officer after 1200. AVS needs to be with patient with correct med list for [facility]. Has RW ordered. No other needs identified. Cleared by psych- does not need inpatient stay at this time.    Final next level of care: Other (comment) (Has warrant for arrest  will be picked up by parole officer) Barriers to Discharge: Homeless with medical needs,Continued Medical Work up   Patient Goals and CMS Choice Patient states their goals for this hospitalization and ongoing recovery are:: will likely return to prison, gave permission to contact parole officer Ms Samuel Bouche at C [phone], [place] [phone]. CSW to call      Discharge Placement                       Discharge Plan and Services In-house Referral: Clinical Social Work Discharge Planning Services: CM Consult Post Acute Care Choice: Durable Medical Equipment          DME Arranged: Dan Humphreys rolling DME Agency: [employer] Date DME Agency Contacted: 02/10/21 Time DME Agency Contacted: [phone] Representative spoke with at DME Agency: Maud Deed HH Arranged: NA (pending patient's return to prison)          Social Determinants of Health (SDOH) Interventions     Readmission Risk Interventions No flowsheet data found.
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
## 2021-02-11 NOTE — Discharge Instructions (Addendum)
[name], [name] were admitted to the hospital after being found confused. We think you may have had a seizure from alcohol withdrawal. Here we treated you with medicine to prevent alcohol withdrawal, and you were seen by psychiatry. You were also found to be retaining urine, so a catheter was placed. We discussed the risks of discontinuing the foley (damange to bladder, kidneys, infection), and you expressed understanding but elected for foley removal. You were started on a medication to help with urinary retention called Flomax. We are also starting iron supplementation.  Take care!

## 2021-02-11 NOTE — Plan of Care (Signed)

## 2021-02-13 LAB — CULTURE, BLOOD (ROUTINE X 2)
Culture: NO GROWTH
Culture: NO GROWTH

## 2021-02-17 LAB — VITAMIN B1: Vitamin B1 (Thiamine): 127.5 nmol/L (ref 66.5–200.0)

## 2021-04-23 ENCOUNTER — Ambulatory Visit (INDEPENDENT_AMBULATORY_CARE_PROVIDER_SITE_OTHER): Payer: Medicaid Other | Admitting: Primary Care

## 2021-05-13 ENCOUNTER — Other Ambulatory Visit (HOSPITAL_COMMUNITY): Payer: Self-pay

## 2021-05-13 ENCOUNTER — Other Ambulatory Visit: Payer: Self-pay | Admitting: *Deleted

## 2021-05-13 MED ORDER — THIAMINE HCL 100 MG PO TABS
100.0000 mg | ORAL_TABLET | Freq: Every day | ORAL | 0 refills | Status: DC
  Filled 2021-05-13: qty 100, 100d supply, fill #0
  Filled 2021-05-13: qty 30, 30d supply, fill #0

## 2021-05-13 MED ORDER — ARIPIPRAZOLE 15 MG PO TABS
15.0000 mg | ORAL_TABLET | Freq: Every day | ORAL | 1 refills | Status: DC
  Filled 2021-05-13 (×2): qty 30, 30d supply, fill #0

## 2021-05-13 MED ORDER — LACTULOSE ENCEPHALOPATHY 10 GM/15ML PO SOLN
20.0000 g | Freq: Two times a day (BID) | ORAL | 1 refills | Status: DC
  Filled 2021-05-13 (×2): qty 1800, 30d supply, fill #0

## 2021-05-13 MED ORDER — OMEPRAZOLE 20 MG PO CPDR
20.0000 mg | DELAYED_RELEASE_CAPSULE | Freq: Two times a day (BID) | ORAL | 0 refills | Status: DC | PRN
  Filled 2021-05-13 (×2): qty 60, 30d supply, fill #0

## 2021-05-13 MED ORDER — TAMSULOSIN HCL 0.4 MG PO CAPS
0.4000 mg | ORAL_CAPSULE | Freq: Every day | ORAL | 1 refills | Status: DC
  Filled 2021-05-13 (×2): qty 30, 30d supply, fill #0

## 2021-05-13 MED ORDER — FOLIC ACID 1 MG PO TABS
1.0000 mg | ORAL_TABLET | Freq: Every day | ORAL | 1 refills | Status: DC
  Filled 2021-05-13 (×2): qty 30, 30d supply, fill #0

## 2021-05-13 MED ORDER — FERROUS SULFATE 325 (65 FE) MG PO TABS
325.0000 mg | ORAL_TABLET | Freq: Every day | ORAL | 3 refills | Status: DC
  Filled 2021-05-13 (×2): qty 30, 30d supply, fill #0

## 2021-12-04 ENCOUNTER — Other Ambulatory Visit: Payer: Self-pay

## 2021-12-04 ENCOUNTER — Emergency Department (HOSPITAL_COMMUNITY)
Admission: EM | Admit: 2021-12-04 | Discharge: 2021-12-05 | Discharge status: Against Medical Advice | Payer: Medicare Other | Attending: Emergency Medicine | Admitting: Emergency Medicine

## 2021-12-04 ENCOUNTER — Emergency Department (HOSPITAL_COMMUNITY): Payer: Medicare Other

## 2021-12-04 DIAGNOSIS — R45851 Suicidal ideations: Secondary | ICD-10-CM | POA: Diagnosis not present

## 2021-12-04 DIAGNOSIS — Z5900 Homelessness unspecified: Secondary | ICD-10-CM | POA: Insufficient documentation

## 2021-12-04 DIAGNOSIS — R0602 Shortness of breath: Secondary | ICD-10-CM | POA: Diagnosis not present

## 2021-12-04 DIAGNOSIS — Z5329 Procedure and treatment not carried out because of patient's decision for other reasons: Secondary | ICD-10-CM | POA: Diagnosis not present

## 2021-12-04 DIAGNOSIS — Z20822 Contact with and (suspected) exposure to covid-19: Secondary | ICD-10-CM | POA: Insufficient documentation

## 2021-12-04 DIAGNOSIS — R079 Chest pain, unspecified: Secondary | ICD-10-CM | POA: Diagnosis present

## 2021-12-04 LAB — RESP PANEL BY RT-PCR (FLU A&B, COVID) ARPGX2
Influenza A by PCR: NEGATIVE
Influenza B by PCR: NEGATIVE
SARS Coronavirus 2 by RT PCR: NEGATIVE

## 2021-12-04 LAB — ETHANOL: Alcohol, Ethyl (B): 146 mg/dL — ABNORMAL HIGH (ref <10)

## 2021-12-04 LAB — BASIC METABOLIC PANEL
Anion gap: 12 (ref 5–15)
BUN: 13 mg/dL (ref 8–23)
CO2: 23 mmol/L (ref 22–32)
Calcium: 9.1 mg/dL (ref 8.9–10.3)
Chloride: 105 mmol/L (ref 98–111)
Creatinine, Ser: 0.87 mg/dL (ref 0.61–1.24)
GFR, Estimated: >60 mL/min (ref >60)
Glucose, Bld: 88 mg/dL (ref 70–99)
Potassium: 3.9 mmol/L (ref 3.5–5.1)
Sodium: 140 mmol/L (ref 135–145)

## 2021-12-04 LAB — RAPID URINE DRUG SCREEN, HOSP PERFORMED
Amphetamines: NOT DETECTED
Barbiturates: NOT DETECTED
Benzodiazepines: NOT DETECTED
Cocaine: NOT DETECTED
Opiates: NOT DETECTED
Tetrahydrocannabinol: NOT DETECTED

## 2021-12-04 LAB — CBC
HCT: 38.4 % — ABNORMAL LOW (ref 39.0–52.0)
Hemoglobin: 11.8 g/dL — ABNORMAL LOW (ref 13.0–17.0)
MCH: 23.4 pg — ABNORMAL LOW (ref 26.0–34.0)
MCHC: 30.7 g/dL (ref 30.0–36.0)
MCV: 76.2 fL — ABNORMAL LOW (ref 80.0–100.0)
Platelets: 162 10*3/uL (ref 150–400)
RBC: 5.04 MIL/uL (ref 4.22–5.81)
RDW: 15.4 % (ref 11.5–15.5)
WBC: 8.1 10*3/uL (ref 4.0–10.5)
nRBC: 0 % (ref 0.0–0.2)

## 2021-12-04 LAB — ACETAMINOPHEN LEVEL: Acetaminophen (Tylenol), Serum: <10 ug/mL — ABNORMAL LOW (ref 10–30)

## 2021-12-04 LAB — SALICYLATE LEVEL: Salicylate Lvl: <7 mg/dL — ABNORMAL LOW (ref 7.0–30.0)

## 2021-12-04 LAB — TROPONIN I (HIGH SENSITIVITY): Troponin I (High Sensitivity): 6 ng/L (ref <18)

## 2021-12-04 NOTE — ED Triage Notes (Addendum)
Pt c/o chest pain, shortness of breath, and feeling suicidal. Pt states he wants to die. Pt states he just got out of prison today and he has no where to go.

## 2021-12-04 NOTE — ED Provider Triage Note (Signed)
Emergency Medicine Provider Triage Evaluation Note  [name] , a 66 y.o. male  was evaluated in triage.  Pt complains of chest pain, palpitations.  Started a couple of hours ago.  Recently released from jail and doesn't have anywhere to stay.  Feels suicidal.  Review of Systems  Positive: Chest pain, palpitations Negative: Fever, chills  Physical Exam  BP (!) 163/84    Pulse 64    Temp 97.8 F (36.6 C) (Oral)    Resp 16    Ht 5\' 8"  (1.727 m)    Wt 90.7 kg    SpO2 100%    BMI 30.41 kg/m  Gen:   Awake, no distress   Resp:  Normal effort  MSK:   Moves extremities without difficulty  Other:    Medical Decision Making  Medically screening exam initiated at 10:17 PM.  Appropriate orders placed.  [name] was informed that the remainder of the evaluation will be completed by another provider, this initial triage assessment does not replace that evaluation, and the importance of remaining in the ED until their evaluation is complete.  Chest pain   Montine Circle, PA-C 12/04/21 2218

## 2021-12-05 DIAGNOSIS — R079 Chest pain, unspecified: Secondary | ICD-10-CM | POA: Diagnosis not present

## 2021-12-05 LAB — TROPONIN I (HIGH SENSITIVITY): Troponin I (High Sensitivity): 6 ng/L (ref <18)

## 2021-12-05 NOTE — ED Provider Notes (Signed)
Patient well known to this department here with reported SI and homelessness. Awaiting TTS evaluation  Physical Exam  BP (!) 157/78    Pulse 69    Temp 97.8 F (36.6 C) (Oral)    Resp 19    Ht 5\' 8"  (1.727 m)    Wt 90.7 kg    SpO2 97%    BMI 30.41 kg/m   Physical Exam  Procedures  Procedures  ED Course / MDM    Medical Decision Making Amount and/or Complexity of Data Reviewed Labs: ordered. Radiology: ordered.   Per Felton Clinton, patient has asked to leave the department. TTS had recommended inpatient psych however the patient told her wanted to leave. He denied any further SI to her. I was unavailable top speak to him at the time as I was involved with the care of another patient.        Truddie Hidden, MD 12/05/21 [phone]

## 2021-12-05 NOTE — ED Notes (Signed)
Belongings and items with security all released to patient.
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
## 2021-12-05 NOTE — ED Notes (Signed)
Pt moved to room at this time 

## 2021-12-05 NOTE — Progress Notes (Signed)
Per Ene Ajibola,NP, patient meets criteria for inpatient treatment. There are no available or appropriate beds at [facility] today. CSW faxed referrals to the following facilities for review: ° °[facility]  Pending - No Request Sent N/A [address] [place] [address] [phone] --  °[facility]  Pending - No Request Sent N/A [address] [place] [address] [phone] --  °[facility]  Pending - No Request Sent N/A [address] [place] [address] [phone] --  °[facility]  Pending - No Request Sent N/A [address] [place] [address] [phone] --  °[facility]  Pending - No Request Sent N/A [address] [place] [address] [phone] --  °[facility]  Pending - No Request Sent N/A [address], [place] [address] [phone] --  °[facility]  Pending - No Request Sent N/A [address], [place] [address] [phone] --  °[facility]  Pending - No Request Sent N/A [address] [place] [address] [phone] --  °[facility]  Pending - No Request Sent N/A [address] [place] [address] [phone] --  °[facility]  Pending - No Request Sent N/A [address] [place] [address] [phone] --  °[facility]  Pending - No Request Sent N/A [address] [place] [address] [phone] --  °[facility]  Pending - No Request Sent N/A [address], [place] [address] [phone] --  °[facility]  Pending - No Request Sent N/A [address], [place] [address] [phone] --  °[facility]  Pending - No Request Sent N/A [address] [place] [address] [phone] --  °[facility]  Pending - No Request Sent N/A [address], [place] [address] [phone] --  °[facility]  Pending - No Request Sent N/A [address], [place] [address] [phone] --  ° ° °TTS will continue to seek bed placement. ° °Delroy Ordway, MSW, LCSW-A, LCAS-A °Phone: [phone] °Disposition/TOC ° °

## 2021-12-05 NOTE — ED Notes (Signed)
Pt being TTS at this time  

## 2021-12-05 NOTE — ED Provider Notes (Cosign Needed)
[facility] Emergency Department Provider Note MRN:  [id number]  Arrival date & time: 12/05/21     Chief Complaint   Shortness of Breath, Chest Pain, and Suicidal   History of Present Illness   [name] is a 67 y.o. year-old male presents to the ED with chief complaint of chest pain and palpitations.  Reports some associated SOB.  States that he just got out of jail and doesn't have anywhere to go.  Arrives with a tent and a sleeping bag.  States that he has been drinking.  Denies fever, chills, or cough.  History provided by patient.  Review of Systems  Pertinent review of systems noted in HPI.    Physical Exam   Vitals:   12/05/21 0245 12/05/21 0330  BP: 138/77 134/76  Pulse: 73 74  Resp: 17 17  Temp:    SpO2: 97% 96%    CONSTITUTIONAL:  nontoxic-appearing, NAD NEURO:  Alert and oriented x 3, CN 3-12 grossly intact EYES:  eyes equal and reactive ENT/NECK:  Supple, no stridor  CARDIO:  regular rate, regular rhythm, appears well-perfused  PULM:  No respiratory distress, CTAB GI/GU:  non-distended,  MSK/SPINE:  No gross deformities, no edema, moves all extremities  SKIN:  no rash, atraumatic   *Additional and/or pertinent findings included in MDM below  Diagnostic and Interventional Summary    EKG Interpretation  Date/Time:  Saturday December 04 2021 21:32:34 EST Ventricular Rate:  62 PR Interval:  150 QRS Duration: 92 QT Interval:  416 QTC Calculation: 422 R Axis:   83 Text Interpretation: Normal sinus rhythm Normal ECG When compared with ECG of 07-Feb-2021 17:32,  no longer tachycardic Confirmed by Marianna Fuss ([id number]) on 12/05/2021 4:13:54 AM       Labs Reviewed  CBC - Abnormal; Notable for the following components:      Result Value   Hemoglobin 11.8 (*)    HCT 38.4 (*)    MCV 76.2 (*)    MCH 23.4 (*)    All other components within normal limits  ETHANOL - Abnormal; Notable for the following components:   Alcohol,  Ethyl (B) 146 (*)    All other components within normal limits  SALICYLATE LEVEL - Abnormal; Notable for the following components:   Salicylate Lvl <7.0 (*)    All other components within normal limits  ACETAMINOPHEN LEVEL - Abnormal; Notable for the following components:   Acetaminophen (Tylenol), Serum <10 (*)    All other components within normal limits  RESP PANEL BY RT-PCR (FLU A&B, COVID) [id number]  BASIC METABOLIC PANEL  RAPID URINE DRUG SCREEN, HOSP PERFORMED  TROPONIN I (HIGH SENSITIVITY)  TROPONIN I (HIGH SENSITIVITY)    DG Chest 2 View  Final Result      Medications - No data to display   Procedures  /  Critical Care Procedures  ED Course and Medical Decision Making  I have reviewed the triage vital signs, the nursing notes, and pertinent available records from the EMR.  Complexity of Problems Addressed: High Complexity: Acute illness/injury posing a threat to life or bodily function, requiring emergent diagnostic workup, evaluation, and treatment as below.  ED Course:    After considering the following differential, ACS, PE, infection, intoxication, I agree with labs ordered in triage.  I personally interpreted the labs which are notable for elevated ethanol, normal initial trop.  Repeat trop is normal. I visualized the chest x-ray which is notable for no obvious infiltrate and agree with the  radiologist interpretation, there is a retained projectile that has been before. EKG I observed the patient while on the cardiac monitor and noted no significant dysrhythmias.    Social Determinants Affecting Care: Patient's homelessness and alcoholism  Consultants: TTS was consulted and recommendation is for inpatient psychiatric treatment. They are working on finding a bed.   Treatment and Plan:  Medically cleared from reported CP complaint, though I suspect this was with substantial secondary gain in order to have a place to stay.  He also endorsed SI and is  recommended for inpatient treatment.   Final Clinical Impressions(s) / ED Diagnoses     ICD-10-CM   1. Suicidal ideation  R45.851     2. Chest pain, unspecified type  R07.9       ED Discharge Orders     None        Discharge Instructions Discussed with and Provided to Patient:   Discharge Instructions   None      Roxy Horseman, PA-C 12/05/21 1937

## 2021-12-05 NOTE — ED Notes (Signed)
Patient alert and oriented and denies SI-requesting to leave. Bernette Mayers MD aware and confirms patient can leave LWBS if no longer suicidal. Patient states only said that last night due to intoxication

## 2021-12-05 NOTE — ED Notes (Signed)
Per provider pt can have something to eat. Provided pt with sandwich and water at this time

## 2022-01-05 ENCOUNTER — Other Ambulatory Visit: Payer: Self-pay

## 2022-01-05 ENCOUNTER — Encounter (HOSPITAL_COMMUNITY): Payer: Self-pay

## 2022-01-05 ENCOUNTER — Emergency Department (HOSPITAL_COMMUNITY)
Admission: EM | Admit: 2022-01-05 | Discharge: 2022-01-06 | Disposition: A | Discharge status: Home / Self Care | Payer: Medicaid Other | Attending: Emergency Medicine | Admitting: Emergency Medicine

## 2022-01-05 DIAGNOSIS — F250 Schizoaffective disorder, bipolar type: Secondary | ICD-10-CM | POA: Insufficient documentation

## 2022-01-05 DIAGNOSIS — T1491XA Suicide attempt, initial encounter: Secondary | ICD-10-CM

## 2022-01-05 DIAGNOSIS — Z046 Encounter for general psychiatric examination, requested by authority: Secondary | ICD-10-CM | POA: Diagnosis present

## 2022-01-05 DIAGNOSIS — R45851 Suicidal ideations: Secondary | ICD-10-CM | POA: Diagnosis not present

## 2022-01-05 DIAGNOSIS — Z20822 Contact with and (suspected) exposure to covid-19: Secondary | ICD-10-CM | POA: Insufficient documentation

## 2022-01-05 DIAGNOSIS — Y906 Blood alcohol level of 120-199 mg/100 ml: Secondary | ICD-10-CM | POA: Diagnosis not present

## 2022-01-05 DIAGNOSIS — F1410 Cocaine abuse, uncomplicated: Secondary | ICD-10-CM

## 2022-01-05 DIAGNOSIS — F1020 Alcohol dependence, uncomplicated: Secondary | ICD-10-CM

## 2022-01-05 DIAGNOSIS — F1994 Other psychoactive substance use, unspecified with psychoactive substance-induced mood disorder: Secondary | ICD-10-CM

## 2022-01-05 DIAGNOSIS — F10920 Alcohol use, unspecified with intoxication, uncomplicated: Secondary | ICD-10-CM

## 2022-01-05 LAB — RAPID URINE DRUG SCREEN, HOSP PERFORMED
Amphetamines: NOT DETECTED
Barbiturates: NOT DETECTED
Benzodiazepines: NOT DETECTED
Cocaine: POSITIVE — AB
Opiates: NOT DETECTED
Tetrahydrocannabinol: NOT DETECTED

## 2022-01-05 LAB — CBC
HCT: 39.6 % (ref 39.0–52.0)
Hemoglobin: 12.2 g/dL — ABNORMAL LOW (ref 13.0–17.0)
MCH: 23.2 pg — ABNORMAL LOW (ref 26.0–34.0)
MCHC: 30.8 g/dL (ref 30.0–36.0)
MCV: 75.3 fL — ABNORMAL LOW (ref 80.0–100.0)
Platelets: 124 10*3/uL — ABNORMAL LOW (ref 150–400)
RBC: 5.26 MIL/uL (ref 4.22–5.81)
RDW: 18.8 % — ABNORMAL HIGH (ref 11.5–15.5)
WBC: 6.8 10*3/uL (ref 4.0–10.5)
nRBC: 0 % (ref 0.0–0.2)

## 2022-01-05 LAB — COMPREHENSIVE METABOLIC PANEL
ALT: 33 U/L (ref 0–44)
AST: 46 U/L — ABNORMAL HIGH (ref 15–41)
Albumin: 3.8 g/dL (ref 3.5–5.0)
Alkaline Phosphatase: 79 U/L (ref 38–126)
Anion gap: 12 (ref 5–15)
BUN: <5 mg/dL — ABNORMAL LOW (ref 8–23)
CO2: 23 mmol/L (ref 22–32)
Calcium: 9.1 mg/dL (ref 8.9–10.3)
Chloride: 103 mmol/L (ref 98–111)
Creatinine, Ser: 0.89 mg/dL (ref 0.61–1.24)
GFR, Estimated: >60 mL/min (ref >60)
Glucose, Bld: 89 mg/dL (ref 70–99)
Potassium: 3.7 mmol/L (ref 3.5–5.1)
Sodium: 138 mmol/L (ref 135–145)
Total Bilirubin: 2.1 mg/dL — ABNORMAL HIGH (ref 0.3–1.2)
Total Protein: 7.9 g/dL (ref 6.5–8.1)

## 2022-01-05 LAB — RESP PANEL BY RT-PCR (FLU A&B, COVID) ARPGX2
Influenza A by PCR: NEGATIVE
Influenza B by PCR: NEGATIVE
SARS Coronavirus 2 by RT PCR: NEGATIVE

## 2022-01-05 LAB — ETHANOL: Alcohol, Ethyl (B): 142 mg/dL — ABNORMAL HIGH (ref <10)

## 2022-01-05 MED ORDER — LORAZEPAM 2 MG/ML IJ SOLN: 0.0000 mg | Freq: Two times a day (BID) | INTRAMUSCULAR | Status: DC

## 2022-01-05 MED ORDER — ARIPIPRAZOLE 10 MG PO TABS
5.0000 mg | ORAL_TABLET | Freq: Every day | ORAL | Status: DC
  Administered 2022-01-05: 5 mg via ORAL
  Filled 2022-01-05: qty 1

## 2022-01-05 MED ORDER — THIAMINE HCL 100 MG PO TABS
100.0000 mg | ORAL_TABLET | Freq: Every day | ORAL | Status: DC
  Administered 2022-01-05: 100 mg via ORAL
  Filled 2022-01-05: qty 1

## 2022-01-05 MED ORDER — DROPERIDOL 2.5 MG/ML IJ SOLN
5.0000 mg | Freq: Once | INTRAMUSCULAR | Status: AC
  Administered 2022-01-05: 5 mg via INTRAMUSCULAR
  Filled 2022-01-05: qty 2

## 2022-01-05 MED ORDER — LORAZEPAM 2 MG/ML IJ SOLN
2.0000 mg | Freq: Once | INTRAMUSCULAR | Status: AC
  Administered 2022-01-05: 2 mg via INTRAMUSCULAR
  Filled 2022-01-05: qty 1

## 2022-01-05 MED ORDER — LORAZEPAM 1 MG PO TABS: 0.0000 mg | ORAL_TABLET | Freq: Two times a day (BID) | ORAL | Status: DC

## 2022-01-05 MED ORDER — LORAZEPAM 1 MG PO TABS: 0.0000 mg | ORAL_TABLET | Freq: Four times a day (QID) | ORAL | Status: DC

## 2022-01-05 MED ORDER — THIAMINE HCL 100 MG/ML IJ SOLN: 100.0000 mg | Freq: Every day | INTRAMUSCULAR | Status: DC

## 2022-01-05 MED ORDER — LORAZEPAM 2 MG/ML IJ SOLN: 0.0000 mg | Freq: Four times a day (QID) | INTRAMUSCULAR | Status: DC

## 2022-01-05 NOTE — ED Triage Notes (Deleted)
Pt arrived POV with a box cutter stating if he is not seen he will hurt someone. Pt states he does not want to live but if we don't help him he isn't going to live. Pt states he just got out of prison and they through him on the streets with nowhere to go and to knowing he wants to hurt himself.  ?

## 2022-01-05 NOTE — ED Notes (Signed)
Triage RN informed of Pt presenting with SI ?

## 2022-01-05 NOTE — ED Triage Notes (Signed)
Pt arrived POV with a box cutter stating if he is not seen he will hurt himself or someone else. Pt states he does not want to live but if we don't help him then he for sure want live. Pt states he is going to cut himself or take some pills but he will not go back to prison. Pt states he just got out not to long ago.  ?

## 2022-01-05 NOTE — BH Assessment (Signed)
Comprehensive Clinical Assessment (CCA) Note ? ?01/05/2022 ?[name] ?[id number] ? ?Disposition:  Per Ophelia Shoulder, NP, patient can be transferred to the [facility] for continuous assessment if medically appropriate.  [facility] is in [facility] currently. ? ?The patient demonstrates the following risk factors for suicide: Chronic risk factors for suicide include: psychiatric disorder of schizoaffective disorder and substance use disorder. Acute risk factors for suicide include: unemployment, social withdrawal/isolation, and recently released from prison . Protective factors for this patient include: patient has no support and lacks coping strategies. Considering these factors, the overall suicide risk at this point appears to be moderate. Patient is not appropriate for outpatient follow up.  ? ?CAGE-AID   ? ?Flowsheet Row ED to Hosp-Admission (Discharged) from 02/07/2021 in [facility] [facility]  ?CAGE-AID Score 0  ? ?  ? ?PHQ2-9   ? ?Flowsheet Row ED from 01/05/2022 in [facility] EMERGENCY DEPARTMENT  ?PHQ-2 Total Score 5  ?PHQ-9 Total Score 24  ? ?  ? ?Flowsheet Row ED from 01/05/2022 in [facility] EMERGENCY DEPARTMENT ED from 12/04/2021 in [facility] EMERGENCY DEPARTMENT ED from 01/20/2021 in [facility] EMERGENCY DEPARTMENT  ?C-SSRS RISK CATEGORY Error: Q7 should not be populated when Q6 is No High Risk Error: Q3, 4, or 5 should not be populated when Q2 is No  ? ?  ?  ? ? ? ?Chief Complaint:  ?Chief Complaint  ?Patient presents with  ? Suicidal  ? ?Visit Diagnosis: F25 Schizoaffective Disorder Bipolar Type ? ? ?CCA Screening, Triage and Referral (STR) ? ?Patient Reported Information ?How did you hear about [place]? Self ? ?What Is the Reason for Your Visit/Call Today? [facility] depressed with suicidal ideation. Patient states, "I don't want to live anymore, I have no life, I am in the woods because I have nowhere to live."   Patient states that he was incarcerated for 11 years for robbery with a dangerous weapon.  He states that he was just released from prison three weeks ago. Patient states that he is having thoughts of cutting himself and that he feels like he needs longer term help because he states that he needs to understand why he does what he does.  Patient states that, "If I don't get any help today and get into a hospital, I will go out here and gut myself to show you I am serious."  Patient states that he has a previous psych hospitalization in 2010 at [facility] and states that he was diagnosed with schizo-affect disorder, bipolar type. Patient states that he has no current mental health provider.  Patient states, "I cannot deal with society and if I had a gun the other day, I probably would have killed someone.  Patient denies any current psychosis. Patient states that he has not eaten in several days and states that he is sleeping and states that he has been up the past seven days.  Patient states that he is drinking five to six 40oz beers and states that he last drank two hours ago. Patient denies any history of abuse.  Patient states that in 2010 while at [facility] that they were going to keep him two years, but he states that his wife got sick and he states that he begged to leave and he was discharged.  He states that his wife died two days later.  Patient states, I need help, I just cannot make it out here in society  anymore, I am too institutionalized." ? ?Patient appears to be seeking hospitalization for the secondary gain of housing.  He is requesting a long term hospitalization and threatening harm to himself if hospital staff do not comply.  Patient is most likely depressed, but much of his depression comes from not having a stable environment to live in, no resources and no support.  His judgment,m insight and impulse control are impaired.  He has anti-social tendencies.  His thoughts are organized and his memory is timtact.   He does not appear to be responding to any internal stimuli.  His speech is normal in rate and tone and his eye contact is good. ? ? ?How Long Has This Been Causing You Problems? 1 wk - 1 month ? ?What Do You Feel Would Help You the Most Today? Treatment for Depression or other mood problem; Alcohol or Drug Use Treatment ? ? ?Have You Recently Had Any Thoughts About Hurting Yourself? Yes ? ?Are You Planning to Commit Suicide/Harm Yourself At This time? Yes ? ? ?Have you Recently Had Thoughts About Hurting Someone [name]? Yes ? ?Are You Planning to Harm Someone at This Time? No ? ?Explanation: No data recorded ? ?Have You Used Any Alcohol or Drugs in the Past 24 Hours? Yes ? ?How Long Ago Did You Use Drugs or Alcohol? No data recorded ?What Did You Use and How Much? Pt reports drinking beer prior to coming to ED. ? ? ?Do You Currently Have a Therapist/Psychiatrist? No ? ?Name of Therapist/Psychiatrist: No data recorded ? ?Have You Been Recently Discharged From Any Office Practice or Programs? No ? ?Explanation of Discharge From Practice/Program: No data recorded ? ?  ?CCA Screening Triage Referral Assessment ?Type of Contact: Tele-Assessment ? ?Telemedicine Service Delivery:   ?Is this Initial or Reassessment? Initial Assessment ? ?Date Telepsych consult ordered in [facility]:  01/05/22 ? ?Time Telepsych consult ordered in [facility]:  1325 ? ?Location of Assessment: [facility] ED ? ?Provider Location: Other (comment) (virtual) ? ? ?Collateral Involvement: No collateral Information available ? ? ?Does Patient Have a Automotive engineer Guardian? No data recorded ?Name and Contact of Legal Guardian: No data recorded ?If Minor and Not Living with Parent(s), Who has Custody? NA ? ?Is CPS involved or ever been involved? Never ? ?Is APS involved or ever been involved? Never ? ? ?Patient Determined To Be At Risk for Harm To Self or Others Based on Review of Patient Reported Information or Presenting Complaint? Yes, for Self-Harm ? ?Method: No  data recorded ?Availability of Means: No data recorded ?Intent: No data recorded ?Notification Required: No data recorded ?Additional Information for Danger to Others Potential: No data recorded ?Additional Comments for Danger to Others Potential: No data recorded ?Are There Guns or Other Weapons in Your Home? No data recorded ?Types of Guns/Weapons: No data recorded ?Are These Weapons Safely Secured?                            No data recorded ?Who Could Verify You Are Able To Have These Secured: No data recorded ?Do You Have any Outstanding Charges, Pending Court Dates, Parole/Probation? No data recorded ?Contacted To Inform of Risk of Harm To Self or Others: Unable to Contact: ? ? ? ?Does Patient Present under Involuntary Commitment? No ? ?IVC Papers Initial File Date: No data recorded ? ?[place] of Residence: [place] ? ? ?Patient Currently Receiving the Following Services: Not Receiving Services ? ? ?Determination of  Need: Emergent (2 hours) ? ? ?Options For Referral: [facility] Urgent Care ? ? ? ? ?CCA Biopsychosocial ?Patient Reported Schizophrenia/Schizoaffective Diagnosis in Past: Yes ? ? ?Strengths: Pt is motivated for treatment ? ? ?Mental Health Symptoms ?Depression:   ?Irritability; Hopelessness; Worthlessness; Increase/decrease in appetite; Difficulty Concentrating; Fatigue; Change in energy/activity; Sleep (too much or little) ?  ?Duration of Depressive symptoms:  ?Duration of Depressive Symptoms: Greater than two weeks ?  ?Mania:   ?None ?  ?Anxiety:    ?Irritability; Fatigue; Restlessness; Worrying ?  ?Psychosis:   ?None ?  ?Duration of Psychotic symptoms:    ?Trauma:   ?None ?  ?Obsessions:   ?None ?  ?Compulsions:   ?None ?  ?Inattention:   ?N/A ?  ?Hyperactivity/Impulsivity:   ?N/A ?  ?Oppositional/Defiant Behaviors:   ?N/A ?  ?Emotional Irregularity:   ?Recurrent suicidal behaviors/gestures/threats ?  ?Other Mood/Personality Symptoms:   ?Pt has anti-social traits. ?  ? ?Mental Status Exam ?Appearance and  self-care  ?Stature:   ?Average ?  ?Weight:   ?Average weight ?  ?Clothing:   ?-- (in scrubs) ?  ?Grooming:   ?Normal ?  ?Cosmetic use:   ?None ?  ?Posture/gait:   ?Normal ?  ?Motor activity:   ?Not Remar

## 2022-01-05 NOTE — ED Notes (Signed)
Pt expressed he would like to go home. RN explained why he is staying in the hospital.  ?

## 2022-01-05 NOTE — ED Notes (Addendum)
PATIENTS BELONGINGS IN LOCKER 4 IN PURPLE ZONE. PATIENT HAS 3 BAGS  ?

## 2022-01-05 NOTE — ED Notes (Signed)
Pt ambulatory to bathroom. Pt currently being TTS in room 49  ?

## 2022-01-05 NOTE — ED Provider Notes (Signed)
?[facility] EMERGENCY DEPARTMENT ?Provider Note ? ? ?CSN: [id number] ?Arrival date & time: 01/05/22  1128 ? ?  ? ?History ?No chief complaint on file. ? ? ?[name] is a 66 y.o. male with a past medical history of alcohol addiction, polysubstance induced mood disorder liver cirrhosis, self-inflicted abdominal stabbing, repeated arm cutting and depression presenting today with SI.  Reports he has been feeling this way for years however got released from prison 3 weeks ago and has been feeling increasingly suicidal.  Says that he was on psychiatric medications in prison however he never really took them.  No longer on any medications.  Says that he drinks 5-6 40s every day.  Denies any drug use.  Last drink just prior to arrival.  Denies AVH.  No homicidal ideations.  Says that he gets very angry easily. ? ? ?Home Medications ?Prior to Admission medications   ?Medication Sig Start Date End Date Taking? Authorizing Provider  ?acetaminophen (TYLENOL) 325 MG tablet Take 2 tablets (650 mg total) by mouth 4 (four) times daily as needed for mild pain or moderate pain. ?Patient not taking: Reported on 12/05/2021 02/11/21   Alphonzo Severance, MD  ?amLODipine (NORVASC) 5 MG tablet Take 5 mg by mouth daily.    [provider]  ?ARIPiprazole (ABILIFY) 15 MG tablet Take 1 tablet (15 mg total) by mouth at bedtime. ?Patient not taking: Reported on 12/05/2021 05/13/21   Storm Frisk, MD  ?benzoyl peroxide 5 % gel Apply 1 application topically 2 (two) times daily. ?Patient not taking: Reported on 12/05/2021 02/11/21   Alphonzo Severance, MD  ?cetirizine (ZYRTEC) 10 MG tablet Take 10 mg by mouth daily.    [provider]  ?docusate sodium (COLACE) 100 MG capsule Take 100 mg by mouth daily.    [provider]  ?ferrous sulfate 325 (65 FE) MG tablet Take 1 tablet (325 mg total) by mouth daily. ?Patient not taking: Reported on 12/05/2021 05/13/21 09/10/21  Storm Frisk, MD  ?folic acid (FOLVITE)  1 MG tablet Take 1 tablet (1 mg total) by mouth daily. ?Patient not taking: Reported on 12/05/2021 05/13/21   Storm Frisk, MD  ?furosemide (LASIX) 20 MG tablet Take 20 mg by mouth daily.    [provider]  ?lactulose, encephalopathy, (CHRONULAC) 10 GM/15ML SOLN Take 30 mLs (20 g total) by mouth 2 (two) times daily. ?Patient not taking: Reported on 12/05/2021 05/13/21   Storm Frisk, MD  ?levothyroxine (SYNTHROID) 50 MCG tablet Take 50 mcg by mouth daily before breakfast.    [provider]  ?lidocaine (LIDODERM) 5 % Place 1 patch onto the skin daily. Remove & Discard patch within 12 hours or as directed by MD ?Patient not taking: Reported on 12/05/2021 02/11/21   Alphonzo Severance, MD  ?Multiple Vitamin (MULTIVITAMIN WITH MINERALS) TABS tablet Take 1 tablet by mouth daily. ?Patient not taking: Reported on 12/05/2021 02/11/21   Alphonzo Severance, MD  ?omeprazole (PRILOSEC) 20 MG capsule Take 1 capsule (20 mg total) by mouth 2 (two) times daily as needed (acid reflux). ?Patient not taking: Reported on 12/05/2021 05/13/21   Storm Frisk, MD  ?pantoprazole (PROTONIX) 20 MG tablet Take 20 mg by mouth daily.    [provider]  ?tamsulosin (FLOMAX) 0.4 MG CAPS capsule Take 1 capsule (0.4 mg total) by mouth daily. ?Patient not taking: Reported on 12/05/2021 05/13/21   Storm Frisk, MD  ?tamsulosin (FLOMAX) 0.4 MG CAPS capsule Take 0.4 mg by mouth  daily.    [provider]  ?thiamine 100 MG tablet Take 1 tablet (100 mg total) by mouth daily. ?Patient not taking: Reported on 12/05/2021 05/13/21   Storm FriskWright, Patrick E, MD  ?   ? ?Allergies    ?Fish allergy and Carrot [daucus carota]   ? ?Review of Systems   ?Review of Systems ? ?Physical Exam ?Updated Vital Signs ?There were no vitals taken for this visit. ?Physical Exam ?Vitals and nursing note reviewed.  ?Constitutional:   ?   Appearance: Normal appearance.  ?HENT:  ?   Head: Normocephalic and atraumatic.  ?Eyes:  ?   General: No scleral  icterus. ?   Conjunctiva/sclera: Conjunctivae normal.  ?Pulmonary:  ?   Effort: Pulmonary effort is normal. No respiratory distress.  ?Skin: ?   General: Skin is warm and dry.  ?   Findings: No rash.  ?   Comments: 8 inch well-healed laceration to the dorsal left forearm.  Also with many well-healed lacerations to the abdomen. No other obvious signs of self-harm.  ?Neurological:  ?   Mental Status: He is alert.  ?Psychiatric:     ?   Mood and Affect: Mood normal.  ? ? ?ED Results / Procedures / Treatments   ?Labs ?(all labs ordered are listed, but only abnormal results are displayed) ?Labs Reviewed  ?COMPREHENSIVE METABOLIC PANEL - Abnormal; Notable for the following components:  ?    Result Value  ? BUN <5 (*)   ? AST 46 (*)   ? Total Bilirubin 2.1 (*)   ? All other components within normal limits  ?ETHANOL - Abnormal; Notable for the following components:  ? Alcohol, Ethyl (B) 142 (*)   ? All other components within normal limits  ?CBC - Abnormal; Notable for the following components:  ? Hemoglobin 12.2 (*)   ? MCV 75.3 (*)   ? MCH 23.2 (*)   ? RDW 18.8 (*)   ? Platelets 124 (*)   ? All other components within normal limits  ?RAPID URINE DRUG SCREEN, HOSP PERFORMED - Abnormal; Notable for the following components:  ? Cocaine POSITIVE (*)   ? All other components within normal limits  ?RESP PANEL BY RT-PCR (FLU A&B, COVID) ARPGX2  ? ? ?EKG ?None ? ?Radiology ?No results found. ? ?Procedures ?Procedures  ? ?Medications Ordered in ED ?Medications  ?LORazepam (ATIVAN) injection 0-4 mg (has no administration in time range)  ?  Or  ?LORazepam (ATIVAN) tablet 0-4 mg (has no administration in time range)  ?LORazepam (ATIVAN) injection 0-4 mg (has no administration in time range)  ?  Or  ?LORazepam (ATIVAN) tablet 0-4 mg (has no administration in time range)  ?thiamine tablet 100 mg (has no administration in time range)  ?  Or  ?thiamine (B-1) injection 100 mg (has no administration in time range)  ? ? ?ED Course/ Medical  Decision Making/ A&P ?  ?                        ?Medical Decision Making ?Amount and/or Complexity of Data Reviewed ?Labs: ordered. ? ? ?66 year old male with an extensive past medical history of psychiatric disorders presenting today with SI.  He was checked in and in the waiting room screaming that he will stab himself if he is not seen immediately.  Box cutter was in hand.  [employer] was called by nursing staff.  I did my initial evaluation and patient was teary and saying that he needs  help because he will kill himself.  He did have a large well-healed laceration to the right posterior forearm. ? ?Medical clearance revealing of cocaine use.  Ethanol also 142, patient clinically sober and is stable to undergo TTS evaluation ? ? ?Final Clinical Impression(s) / ED Diagnoses ?Final diagnoses:  ?Suicidal behavior with attempted self-injury (HCC)  ? ? ?Rx / DC Orders ?Medically cleared, TTS to dispo ?  ?Saddie Benders, PA-C ?01/05/22 1359 ? ?  ?Sloan Leiter, DO ?01/06/22 0802 ? ?

## 2022-01-05 NOTE — ED Notes (Signed)
[facility] states they can not accept pt until provider looks at chart. [facility] will call nurse when he is accepted.  ?

## 2022-01-05 NOTE — ED Notes (Signed)
RN attempted to call [facility] twice. No answer  ?

## 2022-01-05 NOTE — ED Notes (Addendum)
Pt currently resting comfortably. Axo x4, calm and cooperative. R even and unlabored. Pt requesting [place] sandwich and sprite ?

## 2022-01-05 NOTE — ED Notes (Signed)
Pt currently sleeping, NAD noted. Respirations even and unlabored.  ?

## 2022-01-05 NOTE — ED Provider Notes (Signed)
Patient physically threatening nursing, shouting in hallways, will be given sedation.  ECG reviewed 1 month ago with normal Qtc - okay for single dose IM droperidol with ativan. ?  ?Terald Sleeper, MD ?01/05/22 1814 ? ?

## 2022-01-05 NOTE — ED Notes (Signed)
Pt got extremely aggressive and verbally abusive. Security called.  ?

## 2022-01-06 NOTE — ED Provider Notes (Addendum)
Emergency Medicine Observation Re-evaluation Note ? ?[name] is a 66 y.o. male, seen on rounds today.  Pt initially presented to the ED for complaints of  substance use and related mood disorder. Pt reports now feeling much improved and ready for d/c. Denies thoughts of harm to self or others.  ? ?Physical Exam  ?BP (!) 147/86   Pulse 62   Temp 97.8 ?F (36.6 ?C) (Oral)   Resp 17   SpO2 100%  ?Physical Exam ?General: alert, content.  ?Cardiac: regular rate.  ?Lungs: breathing comfortably. ?Psych: normal mood and affect. Pt does not appear acutely depressed or despondent. Pt denies thoughts of harm to self or others. Pt does not appear to be responding to internal stimuli. No delusions or hallucinations are noted.  ? ?ED Course / MDM  ? ? ?I have reviewed the labs performed to date as well as medications administered while in observation.  Recent changes in the last 24 hours include ED observation and reassessment.  ? ?Plan  ? ?Patient reports feeling improved and ready for d/c. He has normal appetite/ate well. Denies physical c/o.  On exam, he exhibits normal mood and affect without any thoughts of harm to self or others. No acute psychosis is noted. Discussed/offered continued ED obs or BH obs/tx - pt does not feel he would benefit from either, he indicates he is feeling fine, at baseline, and ready for d/c, indicates is willing to f/u as outpatient.  Also offered librium re etoh use disorder - pt declines, indicates does not need rx.   ? ?Pt currently appears stable for d/c. Return precautions provided.  ? ? ? ? ?  ?Cathren Laine, MD ?01/06/22 0930 ? ?

## 2022-01-06 NOTE — Discharge Instructions (Signed)
It was our pleasure to provide your ER care today - we hope that you feel better. ? ?Avoid alcohol and cocaine use as both are harmful to your physical health and mental well-being. See resource guide provided for treatment programs.  ? ?For mental health issues and/or crisis, you may go directly to the [facility] - it is open 24/7 and walk-ins are welcome.  ? ?Also follow up with primary care doctor.  ? ?Return to ER if worse, new symptoms, fevers, chest pain, trouble breathing, or other concern.  ?

## 2022-01-06 NOTE — ED Notes (Signed)
Pt stated he wants to leave and speak with a provider. Pt states the hospital has until 12pm to get his stuff together to leave and he will not be going to any behavioral facility. ? ?RN informed pt he is IVC d/t the SI comments made when he arrived.  ? ?RN notified EDP and LCAS to see if available to speak with  ? ? ?

## 2022-01-06 NOTE — ED Notes (Signed)
Pt refusing to allow staff to obtain discharge VS. Pt alert and oriented. Pt able to ambulate out of the department with an even and steady gait. Respirations are even and non-labored. Pt continues to deny SI and HI at this time  ? ?Pt provided his belongings in triage and given resources pack, state that he doesn't need it and is going to throw away and his way out of hospital  ? ? ? ?

## 2022-01-06 NOTE — ED Notes (Addendum)
Pt came to nurses station upset because a doctor has not come to talk to him all night. This RN educated pt that we were waiting for him to get placed at a facility and that no doctor was supposed to come see him. He stated that he needed to talk to a doctor so that he can get his belongings and leave. Rn educated that he is under IVC so he is not allowed to leave. Pt redirected back to bed.  ?

## 2022-01-06 NOTE — ED Notes (Signed)
Pt irate and demanding to speak with MD. Betsey Holiday MD made aware ?

## 2022-01-10 ENCOUNTER — Emergency Department (HOSPITAL_COMMUNITY): Payer: Medicaid Other

## 2022-01-10 ENCOUNTER — Other Ambulatory Visit: Payer: Self-pay

## 2022-01-10 ENCOUNTER — Emergency Department (HOSPITAL_COMMUNITY)
Admission: EM | Admit: 2022-01-10 | Discharge: 2022-01-10 | Disposition: A | Discharge status: Home / Self Care | Payer: Medicaid Other | Attending: Emergency Medicine | Admitting: Emergency Medicine

## 2022-01-10 ENCOUNTER — Encounter (HOSPITAL_COMMUNITY): Payer: Self-pay | Admitting: Emergency Medicine

## 2022-01-10 DIAGNOSIS — R1030 Lower abdominal pain, unspecified: Secondary | ICD-10-CM

## 2022-01-10 DIAGNOSIS — K439 Ventral hernia without obstruction or gangrene: Secondary | ICD-10-CM | POA: Diagnosis not present

## 2022-01-10 LAB — COMPREHENSIVE METABOLIC PANEL
ALT: 36 U/L (ref 0–44)
AST: 55 U/L — ABNORMAL HIGH (ref 15–41)
Albumin: 3.8 g/dL (ref 3.5–5.0)
Alkaline Phosphatase: 76 U/L (ref 38–126)
Anion gap: 10 (ref 5–15)
BUN: 8 mg/dL (ref 8–23)
CO2: 23 mmol/L (ref 22–32)
Calcium: 9.1 mg/dL (ref 8.9–10.3)
Chloride: 98 mmol/L (ref 98–111)
Creatinine, Ser: 0.76 mg/dL (ref 0.61–1.24)
GFR, Estimated: >60 mL/min (ref >60)
Glucose, Bld: 84 mg/dL (ref 70–99)
Potassium: 4 mmol/L (ref 3.5–5.1)
Sodium: 131 mmol/L — ABNORMAL LOW (ref 135–145)
Total Bilirubin: 3.3 mg/dL — ABNORMAL HIGH (ref 0.3–1.2)
Total Protein: 7.8 g/dL (ref 6.5–8.1)

## 2022-01-10 LAB — RAPID URINE DRUG SCREEN, HOSP PERFORMED
Amphetamines: NOT DETECTED
Barbiturates: NOT DETECTED
Benzodiazepines: NOT DETECTED
Cocaine: NOT DETECTED
Opiates: NOT DETECTED
Tetrahydrocannabinol: NOT DETECTED

## 2022-01-10 LAB — URINALYSIS, ROUTINE W REFLEX MICROSCOPIC
Bacteria, UA: NONE SEEN
Bilirubin Urine: NEGATIVE
Glucose, UA: NEGATIVE mg/dL
Ketones, ur: 5 mg/dL — AB
Leukocytes,Ua: NEGATIVE
Nitrite: NEGATIVE
Protein, ur: NEGATIVE mg/dL
Specific Gravity, Urine: 1.003 — ABNORMAL LOW (ref 1.005–1.030)
pH: 5 (ref 5.0–8.0)

## 2022-01-10 LAB — CBC WITH DIFFERENTIAL/PLATELET
Abs Immature Granulocytes: 0.02 10*3/uL (ref 0.00–0.07)
Basophils Absolute: 0.1 10*3/uL (ref 0.0–0.1)
Basophils Relative: 1 %
Eosinophils Absolute: 0 10*3/uL (ref 0.0–0.5)
Eosinophils Relative: 1 %
HCT: 39 % (ref 39.0–52.0)
Hemoglobin: 11.8 g/dL — ABNORMAL LOW (ref 13.0–17.0)
Immature Granulocytes: 0 %
Lymphocytes Relative: 27 %
Lymphs Abs: 1.5 10*3/uL (ref 0.7–4.0)
MCH: 22.9 pg — ABNORMAL LOW (ref 26.0–34.0)
MCHC: 30.3 g/dL (ref 30.0–36.0)
MCV: 75.6 fL — ABNORMAL LOW (ref 80.0–100.0)
Monocytes Absolute: 0.6 10*3/uL (ref 0.1–1.0)
Monocytes Relative: 10 %
Neutro Abs: 3.5 10*3/uL (ref 1.7–7.7)
Neutrophils Relative %: 61 %
Platelets: 138 10*3/uL — ABNORMAL LOW (ref 150–400)
RBC: 5.16 MIL/uL (ref 4.22–5.81)
RDW: 18.4 % — ABNORMAL HIGH (ref 11.5–15.5)
WBC: 5.7 10*3/uL (ref 4.0–10.5)
nRBC: 0 % (ref 0.0–0.2)

## 2022-01-10 LAB — CK: Total CK: 163 U/L (ref 49–397)

## 2022-01-10 LAB — LIPASE, BLOOD: Lipase: 37 U/L (ref 11–51)

## 2022-01-10 MED ORDER — DICYCLOMINE HCL 10 MG PO CAPS
10.0000 mg | ORAL_CAPSULE | Freq: Once | ORAL | Status: AC
  Administered 2022-01-10: 10 mg via ORAL
  Filled 2022-01-10: qty 1

## 2022-01-10 MED ORDER — ONDANSETRON 4 MG PO TBDP
4.0000 mg | ORAL_TABLET | Freq: Once | ORAL | Status: AC
  Administered 2022-01-10: 4 mg via ORAL
  Filled 2022-01-10: qty 1

## 2022-01-10 MED ORDER — IOHEXOL 350 MG/ML SOLN
100.0000 mL | Freq: Once | INTRAVENOUS | Status: AC | PRN
Start: 2022-01-10 — End: 2022-01-10
  Administered 2022-01-10: 100 mL via INTRAVENOUS

## 2022-01-10 NOTE — ED Provider Triage Note (Signed)
Emergency Medicine Provider Triage Evaluation Note ? ?[name] , a 66 y.o. male  was evaluated in triage.  Pt complains of nausea, vomiting, and abdominal pain.  Patient reports that 2 days ago he took a "pill from a stranger."  Patient is unsure what this pill was.  Since then he has been having generalized abdominal pain, nausea, vomiting and constipation.  Patient states that he has been unable to hold down any p.o. intake over the last 2 days.  Describes emesis as stomach contents and bilious.  Denies any hematemesis or coffee-ground emesis.  Patient reports he has not had a bowel movement in 2 days.   ? ?Denies any fever, chills, abdominal distention, passing flatus, diarrhea, dysuria, hematuria, urinary urgency. ? ?Review of Systems  ?Positive: Nausea, vomiting, constipation, abdominal pain ?Negative: See above ? ?Physical Exam  ?BP (!) 168/82   Pulse 67   Temp 98.3 ?F (36.8 ?C) (Oral)   Resp 18   SpO2 97%  ?Gen:   Awake, no distress   ?Resp:  Normal effort  ?MSK:   Moves extremities without difficulty  ?Other:  Abdomen soft, protuberant with minimal tenderness throughout entire abdomen.  Patient has extensive well-healed surgical scars as well as easily reducible ventral hernia. ? ?Medical Decision Making  ?Medically screening exam initiated at 1:58 PM.  Appropriate orders placed.  ONOFRE GAINS was informed that the remainder of the evaluation will be completed by another provider, this initial triage assessment does not replace that evaluation, and the importance of remaining in the ED until their evaluation is complete. ? ?Due to reports of nausea, vomiting, constipation, and generalized abdominal pain in the setting of previous abdominal surgery concern for possible small bowel obstruction. ?  ?Haskel Schroeder, PA-C ?01/10/22 1400 ? ?

## 2022-01-10 NOTE — ED Triage Notes (Signed)
Patient coming from home, complaint of abdominal pain for 2 days after taking an unknown pill from a drug addict. Endorses N/V for 2 days as well.VSS. ?

## 2022-01-10 NOTE — Discharge Instructions (Addendum)
Your laboratory results were within normal limits. Your CT abdomen and pelvis did not show any acute finding.s ? ? ?Follow up with your primary care physician as needed, if you experience any worsening symptoms you will need to return to the emergency department. ?

## 2022-01-10 NOTE — ED Provider Notes (Signed)
?[facility] ?Provider Note ? ? ?CSN: [id number] ?Arrival date & time: 01/10/22  1316 ? ?  ? ?History ? ?Chief Complaint  ?Patient presents with  ? Abdominal Pain  ? ? ?[name] is a 66 y.o. male. ? 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  Patient reports he was out in the woods when he "took a pill from the stranger ", reports this was supposed to help with his pain, this was dissolvable on his tongue.  States that he does not recall the later episodes.  Continues to have lower abdominal cramping that is been ongoing without any relief.  Also reports his last bowel movement was approximately 2 days ago.  Endorsing an episode of non bilious emesis. He has not taken any other medication to help with his symptoms. 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 He denies any fever, diarrhea, sick contact, no hematemesis.  ? ?The history is provided by the patient and medical records.  ?Abdominal Pain ?Pain location:  LLQ, RLQ and suprapubic ?Pain quality: cramping   ?Pain radiates to:  Does not radiate ?Pain severity:  Moderate ?Duration:  2 days ?Timing:  Constant ?Progression:  Worsening ?Chronicity:  New ?Context: not alcohol use, not awakening from sleep, not diet changes, not eating, not medication withdrawal, not recent illness, not sick contacts and not suspicious food intake   ?Relieved by:  Nothing ?Worsened by:  Nothing ?Ineffective treatments:  None tried ?Associated symptoms: nausea and vomiting   ?Associated symptoms: no chest pain, no chills, no fever, no shortness of breath and no sore throat   ? ?  ? ?Home Medications ?Prior to Admission medications   ?Medication Sig Start Date End Date Taking? Authorizing Provider  ?acetaminophen (TYLENOL) 325 MG tablet Take 2 tablets (650 mg total) by mouth 4 (four) times daily  as needed for mild pain or moderate pain. ?Patient not taking: Reported on 12/05/2021 02/11/21   Alphonzo SeveranceWatson, Julia, MD  ?amLODipine (NORVASC) 5 MG tablet Take 5 mg by mouth daily. ?Patient not taking: Reported on 01/05/2022    [provider]  ?ARIPiprazole (ABILIFY) 15 MG tablet Take 1 tablet (15 mg total) by mouth at bedtime. ?Patient not taking: Reported on 12/05/2021 05/13/21   Storm FriskWright, Patrick E, MD  ?benzoyl peroxide 5 % gel Apply 1 application topically 2 (two) times daily. ?Patient not taking: Reported on 12/05/2021 02/11/21   Alphonzo SeveranceWatson, Julia, MD  ?cetirizine (ZYRTEC) 10 MG tablet Take 10 mg by mouth daily. ?Patient not taking: Reported on 01/05/2022    [provider]  ?docusate sodium (COLACE) 100 MG capsule Take 100 mg by mouth daily. ?Patient not taking: Reported on 01/05/2022    [provider]  ?ferrous sulfate 325 (65 FE) MG tablet Take 1 tablet (325 mg total) by mouth daily. ?Patient not taking: Reported on 12/05/2021 05/13/21 09/10/21  Storm FriskWright, Patrick E, MD  ?folic acid (FOLVITE) 1 MG tablet Take 1 tablet (1 mg total) by mouth daily. ?Patient not taking: Reported on 12/05/2021 05/13/21   Storm FriskWright, Patrick E, MD  ?furosemide (LASIX) 20 MG tablet Take 20 mg by mouth daily. ?Patient not taking: Reported on 01/05/2022    [provider]  ?lactulose, encephalopathy, (CHRONULAC) 10 GM/15ML SOLN Take 30 mLs (20 g total) by mouth 2 (two) times daily. ?Patient not  taking: Reported on 12/05/2021 05/13/21   Storm Frisk, MD  ?levothyroxine (SYNTHROID) 50 MCG tablet Take 50 mcg by mouth daily before breakfast. ?Patient not taking: Reported on 01/05/2022    [provider]  ?lidocaine (LIDODERM) 5 % Place 1 patch onto the skin daily. Remove & Discard patch within 12 hours or as directed by MD ?Patient not taking: Reported on 12/05/2021 02/11/21   Alphonzo Severance, MD  ?Multiple Vitamin (MULTIVITAMIN WITH MINERALS) TABS tablet Take 1 tablet by mouth daily. ?Patient not taking: Reported on  12/05/2021 02/11/21   Alphonzo Severance, MD  ?omeprazole (PRILOSEC) 20 MG capsule Take 1 capsule (20 mg total) by mouth 2 (two) times daily as needed (acid reflux). ?Patient not taking: Reported on 12/05/2021 05/13/21   Storm Frisk, MD  ?pantoprazole (PROTONIX) 20 MG tablet Take 20 mg by mouth daily. ?Patient not taking: Reported on 01/05/2022    [provider]  ?tamsulosin (FLOMAX) 0.4 MG CAPS capsule Take 1 capsule (0.4 mg total) by mouth daily. ?Patient not taking: Reported on 12/05/2021 05/13/21   Storm Frisk, MD  ?tamsulosin (FLOMAX) 0.4 MG CAPS capsule Take 0.4 mg by mouth daily. ?Patient not taking: Reported on 01/05/2022    [provider]  ?thiamine 100 MG tablet Take 1 tablet (100 mg total) by mouth daily. ?Patient not taking: Reported on 12/05/2021 05/13/21   Storm Frisk, MD  ?   ? ?Allergies    ?Fish allergy and Carrot [daucus carota]   ? ?Review of Systems   ?Review of Systems  ?Constitutional:  Negative for chills and fever.  ?HENT:  Negative for sore throat.   ?Respiratory:  Negative for shortness of breath.   ?Cardiovascular:  Negative for chest pain.  ?Gastrointestinal:  Positive for abdominal pain, nausea and vomiting.  ?Genitourinary:  Negative for flank pain.  ?Musculoskeletal:  Negative for back pain.  ?Neurological:  Negative for light-headedness and headaches.  ?All other systems reviewed and are negative. ? ?Physical Exam ?Updated Vital Signs ?BP (!) 161/80   Pulse 64   Temp 98 ?F (36.7 ?C)   Resp 16   SpO2 99%  ?Physical Exam ?Vitals and nursing note reviewed.  ?Constitutional:   ?   Appearance: He is well-developed.  ?HENT:  ?   Head: Normocephalic and atraumatic.  ?Cardiovascular:  ?   Rate and Rhythm: Normal rate.  ?Pulmonary:  ?   Effort: Pulmonary effort is normal.  ?   Breath sounds: No wheezing or rales.  ?Abdominal:  ?   General: Abdomen is flat. A surgical scar is present. Bowel sounds are decreased.  ?   Palpations: Abdomen is soft. There is no mass.  ?    Tenderness: There is abdominal tenderness in the suprapubic area. There is no right CVA tenderness or left CVA tenderness.  ?   Hernia: A hernia is present. Hernia is present in the ventral area.  ?   Comments: Multiple surgical scars along the RUQ, midline WITH large palpable hernia.   ?Skin: ?   General: Skin is warm and dry.  ?Neurological:  ?   Mental Status: He is alert and oriented to person, place, and time.  ? ? ?ED Results / Procedures / Treatments   ?Labs ?(all labs ordered are listed, but only abnormal results are displayed) ?Labs Reviewed  ?COMPREHENSIVE METABOLIC PANEL - Abnormal; Notable for the following components:  ?    Result Value  ? Sodium 131 (*)   ? AST 55 (*)   ?  Total Bilirubin 3.3 (*)   ? All other components within normal limits  ?CBC WITH DIFFERENTIAL/PLATELET - Abnormal; Notable for the following components:  ? Hemoglobin 11.8 (*)   ? MCV 75.6 (*)   ? MCH 22.9 (*)   ? RDW 18.4 (*)   ? Platelets 138 (*)   ? All other components within normal limits  ?URINALYSIS, ROUTINE W REFLEX MICROSCOPIC - Abnormal; Notable for the following components:  ? Specific Gravity, Urine 1.003 (*)   ? Hgb urine dipstick SMALL (*)   ? Ketones, ur 5 (*)   ? All other components within normal limits  ?RAPID URINE DRUG SCREEN, HOSP PERFORMED  ?LIPASE, BLOOD  ?CK  ? ? ?EKG ?None ? ?Radiology ?CT ABDOMEN PELVIS W CONTRAST ? ?Result Date: 01/10/2022 ?CLINICAL DATA:  Bowel obstruction suspected EXAM: CT ABDOMEN AND PELVIS WITH CONTRAST TECHNIQUE: Multidetector CT imaging of the abdomen and pelvis was performed using the standard protocol following bolus administration of intravenous contrast. RADIATION DOSE REDUCTION: This exam was performed according to the departmental dose-optimization program which includes automated exposure control, adjustment of the mA and/or kV according to patient size and/or use of iterative reconstruction technique. CONTRAST:  OMNIPAQUE IOHEXOL 350 MG/ML SOLN COMPARISON:  02/07/2021  FINDINGS: Lower chest: No acute pleural or parenchymal lung disease. Hepatobiliary: Diffuse hepatic steatosis, with lobularity of the liver capsule suggesting underlying cirrhosis. No focal liver abnormality. C

## 2022-01-12 ENCOUNTER — Ambulatory Visit (HOSPITAL_COMMUNITY)
Admission: EM | Admit: 2022-01-12 | Discharge: 2022-01-12 | Disposition: A | Discharge status: Home / Self Care | Payer: Medicaid Other | Attending: Psychiatry | Admitting: Psychiatry

## 2022-01-12 DIAGNOSIS — F332 Major depressive disorder, recurrent severe without psychotic features: Secondary | ICD-10-CM

## 2022-01-12 DIAGNOSIS — F1090 Alcohol use, unspecified, uncomplicated: Secondary | ICD-10-CM | POA: Diagnosis not present

## 2022-01-12 DIAGNOSIS — F319 Bipolar disorder, unspecified: Secondary | ICD-10-CM | POA: Insufficient documentation

## 2022-01-12 DIAGNOSIS — Z789 Other specified health status: Secondary | ICD-10-CM

## 2022-01-12 DIAGNOSIS — Z5900 Homelessness unspecified: Secondary | ICD-10-CM | POA: Diagnosis not present

## 2022-01-12 NOTE — ED Provider Notes (Addendum)
Behavioral Health Urgent Care Medical Screening Exam ? ?Patient Name: [name] ?MRN: [id number] ?Date of Evaluation: 01/12/22 ?Chief Complaint:   ?Diagnosis:  ?Final diagnoses:  ?Severe episode of recurrent major depressive disorder, without psychotic features (HCC)  ?Alcohol use  ? ? ?History of Present illness: [name] is a 66 y.o. male. Patient presents voluntarily to [facility] behavioral health for walk-in assessment.   ? ?Patient is assessed face-to-face by nurse practitioner.  He is seated in assessment area, no acute distress.  He is alert and oriented, cooperative during assessment.  ? ?[name] reports "I have been out of prison for 5 weeks, they kicked me out with nowhere to go and no money, I have got health problems."  Patient recently released from prison after an eleven year stay. Patient reports he is seeking "somewhere to go and somebody to help me to get my Social Security or disability benefits." ? ?Patient reports he has been diagnosed with bipolar disorder as well as schizophrenia.  He reports he has not taken medications for several months.  He reports "when you are in prison it is like you are a [place] pig, they never could find something that works for me."  He endorses 1 previous inpatient psychiatric hospitalization at [facility] in 2010 related to alcohol use disorder.  He is not currently linked with outpatient mental health services.  No family mental health history reported. ? ?Patient reports recent stressors include frustration that he is unable to apply for his Social Security benefits and is anxiously looking forward to his appointment to apply for benefits via the telephone on 02/18/2022.  He reports his cousin is assisting him with seeking his disability benefits.  He shares that his cousin will not permit him to live with her related to his chronic alcohol use. ? ?[name] has a history of alcohol use disorder.  Since leaving prison 5 weeks ago he has used alcohol  daily.  He reports consuming up to five 40 ounce beers per day.  He reports last alcohol use earlier this date.  He denies substance use aside from alcohol.  He denies any seizure history related to alcohol use. ? ?He presents with depressed mood, congruent affect. He denies suicidal and homicidal ideations.  He contracts verbally for safety with this Clinical research associate. ?He has normal speech and behavior.  He denies both auditory and visual hallucinations.  Patient is able to converse coherently with goal-directed thoughts and no distractibility or preoccupation.  He denies paranoia.  Objectively there is no evidence of psychosis/mania or delusional thinking. ? ?Patient reports he is currently homeless and has been sleeping outside for several days after being asked to leave the home of a friend. He denies access to weapons. He is currently seeking disability benefits, not currently employed. Patient endorses decreased sleep and appetite. ? ?Patient offered support and encouragement. He denies any person to contact for collateral information at this time.  ? ?Patient refuses to make telephone call to complete interview/intake process or alcohol use disorder program.  He states "just send me to [facility], I can stay there for 90 days."  Patient contracts verbally for safety with this Clinical research associate. ? ? ?Psychiatric Specialty Exam ? ?Presentation  ?General Appearance:Appropriate for Environment; Casual ? ?Eye Contact:Good ? ?Speech:Clear and Coherent; Normal Rate ? ?Speech Volume:Normal ? ?Handedness:Right ? ? ?Mood and Affect  ?Mood:Depressed ? ?Affect:Appropriate; Congruent ? ? ?Thought Process  ?Thought Processes:Coherent; Goal Directed; Linear ? ?Descriptions of Associations:Intact ? ?Orientation:Full (Time, Place  and Person) ? ?Thought Content:WDL ? Diagnosis of Schizophrenia or Schizoaffective disorder in past: Yes ?  Hallucinations:None ?Hears voices of people talking about his past ? ?Ideas of Reference:None ? ?Suicidal  Thoughts:No ? ?Homicidal Thoughts:No ? ? ?Sensorium  ?Memory:Immediate Good; Recent Good ? ?Judgment:Fair ? ?Insight:Fair ? ? ?Executive Functions  ?Concentration:Good ? ?Attention Span:Good ? ?Recall:Good ? ?Fund of Knowledge:Good ? ?Language:Good ? ? ?Psychomotor Activity  ?Psychomotor Activity:Normal ? ? ?Assets  ?Assets:Communication Skills; Desire for Improvement; Leisure Time; Resilience; Social Support ? ? ?Sleep  ?Sleep:Fair ? ?Number of hours: No data recorded ? ?No data recorded ? ?Physical Exam: ?Physical Exam ?Vitals and nursing note reviewed.  ?Constitutional:   ?   Appearance: Normal appearance. He is well-developed.  ?HENT:  ?   Head: Normocephalic and atraumatic.  ?   Nose: Nose normal.  ?Cardiovascular:  ?   Rate and Rhythm: Normal rate.  ?Pulmonary:  ?   Effort: Pulmonary effort is normal.  ?Musculoskeletal:     ?   General: Normal range of motion.  ?   Cervical back: Normal range of motion.  ?Skin: ?   General: Skin is warm and dry.  ?Neurological:  ?   Mental Status: He is alert and oriented to person, place, and time.  ?Psychiatric:     ?   Attention and Perception: Attention and perception normal.     ?   Mood and Affect: Affect normal. Mood is depressed.     ?   Speech: Speech normal.     ?   Behavior: Behavior normal. Behavior is cooperative.     ?   Thought Content: Thought content normal.     ?   Cognition and Memory: Cognition and memory normal.     ?   Judgment: Judgment normal.  ? ?Review of Systems  ?Constitutional: Negative.   ?HENT: Negative.    ?Eyes: Negative.   ?Respiratory: Negative.    ?Cardiovascular: Negative.   ?Gastrointestinal: Negative.   ?Genitourinary: Negative.   ?Musculoskeletal: Negative.   ?Skin: Negative.   ?Neurological: Negative.   ?Endo/Heme/Allergies: Negative.   ?Psychiatric/Behavioral:  Positive for depression and substance abuse.   ?Blood pressure (!) 147/68, pulse 72, temperature 98.3 ?F (36.8 ?C), temperature source Oral, resp. rate 18, SpO2 95 %. There is  no height or weight on file to calculate BMI. ? ?Musculoskeletal: ?Strength & Muscle Tone: within normal limits ?Gait & Station: normal ?Patient leans: N/A ? ? ?[facility] MSE Discharge Disposition for Follow up and Recommendations: ?Based on my evaluation the patient does not appear to have an emergency medical condition and can be discharged with resources and follow up care in outpatient services for Medication Management, Substance Abuse Intensive Outpatient Program, and Individual Therapy ?Patient reviewed with Dr. Bronwen Betters. ?Follow-up with outpatient psychiatry, resources provided. ?Follow-up with alcohol and substance use disorder resources provided. ?Follow-up with primary care resource provided. ? ?Lenard Lance, FNP ?01/12/2022, 10:07 AM ? ?

## 2022-01-12 NOTE — Discharge Instructions (Addendum)
?Homeless Shelter List: ? ?  ? ?[facility] ? ?[address] [place], [place] ? ?Phone: [phone] ? ?  ? ?[facility] ? ?[address], [place], [place] [address] ? ?Phone: [phone] ? ?  ? ?[facility] (Women only) ? ?[address], [place], [place] [address] ? ?Phone: [phone] ? ?  ? ?[facility] ? ?[address], [place] [address] ? ?Phone: [phone] ? ?  ? ?[facility]: ? ?[address] ? ?[place], [place] [address] ? ?Phone: [phone] ? ?  ? ?[facility] ? ?[address] [place], [place] [address] ? ?(Check in at 6:00PM for placement at a local shelter) ? ?Phone: [phone] ? ? ?Substance Abuse Treatment Resources listed Below: ? ?[facility] ?- Admissions are currently completed Monday through Friday at 8am; both appointments and walk-ins are accepted.  Any individual that is a [facility] resident may present for a substance abuse screening and assessment for admission.  A person may be referred by numerous sources or self-refer.   Potential clients will be screened for medical necessity and appropriateness for the program.  Clients must meet criteria for high-intensity residential treatment services.  If clinically appropriate, a client will continue with the comprehensive clinical assessment and intake process, as well as enrollment in the [facility]. ? ?Address: [address] ?[place], [place] [address] ?Admin Hours: Mon-Fri 8AM to 5PM ?Center Hours: 24/7 ?Phone: [phone] ?Fax: [phone] ? ?[facility] [employer] ?Address: [address] [place], [place], [place] [address] ?Behavioral Health Urgent Care [facility]) ?Hours: 24/7 ?Phone: [phone] ?Fax: [phone] ? ?[facility] ([facility]): (offers outpatient therapy and intensive outpatient substance abuse therapy).  ?[address], [place], [place] [address] ?Phone: [phone] ? ?[facility]: Offers FREE recovery skills classes, support groups, 1:1 Peer Support, and Compeer Classes. ?[address], [place], [place] [address] ?Phone: [phone] (Call to complete intake).  ? ?[facility] ?[facility] ?[address] ?[place], [place] [address] ?Phone: [phone] ext [phone] ?The [facility] provides food, shelter and other programs and services to the homeless men of [place] through our [employer]. ? ?By offering safe shelter, three meals a day, clean clothing, Biblical counseling, financial planning, vocational training, GED/education and employment assistance, we've helped mend the shattered lives of many homeless men since opening in 1974. ? ?We have approximately 267 beds available, with a max of 312 beds including mats for emergency situations and currently house an average of 270 men a night. ? ?Prospective Client Check-In Information ?Photo ID Required (State/ Out of State/ [facility]) - if photo ID is not available, clients are required to have a printout of a police/sheriff's criminal history report. ?Help out with chores around the [facility]. ?No sex offender of any type (pending, charged, registered and/or any other sex related offenses) will be permitted to check in. ?Must be willing to abide by all rules, regulations, and policies established by the [facility]. ?The following will be provided - shelter, food, clothing, and biblical counseling. ?If you or someone you know is in need of assistance at our [facility] shelter in [place], [place], please call [phone] ext. [phone]. ? ?[facility]-will provide timely access to mental health services for children and adolescents (4-17) and adults presenting in a mental health crisis. The program is designed for  those who need urgent Behavioral Health or Substance Use treatment and are not  experiencing a medical crisis that would typically require an emergency room visit.  ?  ?[address] ?[place], [place] [address] ?Phone: [phone] ?[email or web address] ? ?[facility]: Phone#: [phone] ? ?[facility] ?SA Intensive Outpatient Program (SAIOP) means structured individual and group addiction activities and services that are provided at an outpatient program designed to assist adult and adolescent consumers to begin recovery and learn skills for recovery maintenance. The [facility] [facility] SAIOP program is offered at least 3 hours a day, 3 days a week.SAIOP services shall include a structured program consisting of, but not limited to, the following services: ?Individual counseling and support; Group counseling and support; Family counseling, training or support; Biochemical assays to identify recent drug use (e.g., urine drug screens); Strategies for relapse prevention to include community and social support systems in treatment; Life skills; Crisis contingency planning; Disease Management; and Treatment support activities that have been adapted or specifically designed for persons with physical disabilities, or persons with co-occurring disorders of mental illness and substance abuse/dependence or mental retardation/developmental disability and substance abuse/dependence. ?Phone: [phone]  ?Address:  ? ?[facility] will also offer the following outpatient services: (Monday through Friday 8am-5pm) ?  ?Partial Hospitalization Program (PHP) ?Substance Abuse Intensive Outpatient Program (SA-IOP) ?Group Therapy ?Medication Management ?Peer Living Room ?We also provide (24/7):  ?Assessments: Our mental health clinician and providers will conduct a focused mental health evaluation, assessing for immediate safety concerns and further mental health needs. ?Referral: Our team will provide resources and help connect to community based mental health  treatment, when indicated, including psychotherapy, psychiatry, and other specialized behavioral health or substance use disorder services (for those not already in treatment). ?Transitional Care: Our team providers in person bridging and/or telephonic follow-up during the patient's transition to outpatient services.  ?[facility] ?24-Hour Call Center: ?[phone] ?Behavioral Health Crisis Line: ?[phone] ? ?Patient is instructed prior to discharge to: ? Take all medications as prescribed by his/her mental healthcare provider. ?Report any adverse effects and or reactions from the medicines to his/her outpatient provider promptly. ?Keep all scheduled appointments, to ensure that you are getting refills on time and to avoid any interruption in your medication.  If you are unable to keep an appointment call to reschedule.  Be sure to follow-up with resources and follow-up appointments provided.  ?Patient has been instructed & cautioned: To not engage in alcohol and or illegal drug use while on prescription medicines. ?In the event of worsening symptoms, patient is instructed to call the crisis hotline, 911 and or go to the nearest ED for appropriate evaluation and treatment of symptoms. ?To follow-up with his/her primary care provider for your other medical issues, concerns and or health care needs. ?  ? ?

## 2022-01-12 NOTE — BH Assessment (Signed)
Comprehensive Clinical Assessment (CCA) Note ? ?01/12/2022 ?[name] R Bejar ?[id number] ? ?Disposition: Per Doran Heaterina Allen, NP patient does not meet inpatient criteria.  Outpatient treatment is recommended and patient will be provided with referral information for outpatient providers SA Treatment options.  ? ?The patient demonstrates the following risk factors for suicide: Chronic risk factors for suicide include: psychiatric disorder of reported Bipolar Disorder and substance use disorder. Acute risk factors for suicide include: unemployment, social withdrawal/isolation, and loss (financial, interpersonal, professional). Protective factors for this patient include: coping skills and hope for the future. Considering these factors, the overall suicide risk at this point appears to be low. Patient is appropriate for outpatient follow up. ? ?Patient is a 66  year old male with a history of Alcohol Use Disorder, severe and reported Bipolar Disorder(untreated) who [facility].  Pt called the crisis line requesting assistance due to feeling like he might harm himself. Pt stated he was [facility] but, when asked if he could keep himself safe until the bus was running, pt expressed uncertainty, so clinician encouraged pt to call the [facility]. Pt showed up a short time later (approx 40 minutes later). Clinician attempted to complete the full CCA but pt was irritable and argumentative.  Pt endorsed SI on arrival, stating he has cut and stabbed himself in the past and he will do it again. When asked when pt cut himself and stabbed himself he stated "2010." Pt states he got out of prison after 12 years and 7 months in either January or February 2023 and "has nothing - they kicked me out without anything." Pt states he has attempted to kill himself in the past but is unable to identify when. He states [facility]  [facility] and [facility]x. When asked if he has a plan to kill himself, pt responded, "I already told you! If I had a gun I'd have already shot myself!" When clinician asked if pt has access to a gun he denies this. Pt denies HI and AVH.   Patient shares he drinks 5-6 40's on a daily basis; he states he was drinking up until the time he was picked up by [employer] [facility]. ? ?On assessment with provider, patient states he needs to get into a long term place "like [facility] where you can stay 2 years."  Patient states he has been working with Social Security office to apply for disability and has an appointment on 5/5.  He states he needs a place to stay where "they will help you to get things set up and get your disability checks."  Informed patient there are no options outside of shelters or residential SA programs.  Patient requested that he be referred for residential SA treatment, and affirms he will be safe and "really I need a place to stay so I can get on my feet and get the services and my check set up."  Patient given number for [facility], [place].  He then expresses frustration that he would have to make a phone call for phone assessment and states, "Isn't that something you can just set up for me."  Informed patient he would need to call and he became frustrated stating, "Well then can [facility]  They can keep me up  to 90 days and they have people there that can help me get my checks and other things set up."  Clinician contacted [facility], however they have a wait list and patient will need to call to speak with their CM to be placed on their list.  Clearly, patient is looking for someone to provide direct support for all of his needs and requests a place to stay that "will get me all set up."  Patient has been provided with [facility] contact number, should he decide to pursue Residential SA Treatment.   ? ?Chief Complaint:  ?Chief Complaint  ?Patient presents with  ? Suicidal  ? Alcohol Problem  ? ?Visit Diagnosis: Alcohol Use Disorder, severe ?                            Bipolar Disorder (pt report) ?      ?Flowsheet Row ED from 01/12/2022 in [facility] ED from 01/05/2022 in [facility] EMERGENCY DEPARTMENT  ?Thoughts that you would be better off dead, or of hurting yourself in some way Several days Nearly every day  ?PHQ-9 Total Score 7 24  ? ?  ? ?Flowsheet Row ED from 01/12/2022 in [facility] ED from 01/10/2022 in [facility] EMERGENCY DEPARTMENT ED from 01/05/2022 in [facility] EMERGENCY DEPARTMENT  ?C-SSRS RISK CATEGORY Error: Q3, 4, or 5 should not be populated when Q2 is No No Risk Low Risk  ? ?  ?Low risk today - form is not populating                    ?                                ?CCA Screening, Triage and Referral (STR) ? ?Patient Reported Information ?How did you hear about [place]? Self ? ?What Is the Reason for Your Visit/Call Today? Pt called the crisis line requesting assistance due to feeling like he might harm himself. Pt stated he was going to use the bus to come to the [facility] but, when asked if he could keep himself safe until the bus was running, pt expressed uncertainty, so clinician encouraged pt to call the police to have him brought to the [facility]. Pt showed up a short time later (approx 40 minutes later). Clinician attempted to complete the full CCA but pt was irritable, argumentative, and back-talked clinician throughout the entirety of the assessment. Pt endorses SI, stating he has cut and stabbed himself in the past and he will do it again. When asked when pt cut himself and stabbed himself he stated "2010." Pt states he got out of prison after 12 years and 7 months in either January or February 2023 and "has nothing - they kicked me out without anything." Pt states he has attempted  to kill himself in the past but is unable to identify when. He states he's been hospitalized at [facility] 2x and [facility] 2x. When asked if he has a plan to kill himself, pt responded, "I already told you! If I had a gun I'd have already shot myself!" When clinician asked if pt has access to a gun he denies this. Pt denies HI, VH, and current NSSIB. When asked if pt has access to any weapons, he said, "why are you asking  me the same thing?!?!" Clinician asked if pt had access to any *weapons*, such as a knife. Pt then growled, "I already told you - I hid it in the woods." Clinician informed pt that he had not, infact, informed her that he had hidden a knife in the woods. Pt verified he hid the knife with the intention of going back to get it. Pt endorses experiencing AH, stating, "I see things." Pt shares he drinks 6 40's of EtOH on a daily basis; he states he was drinking up until the time he was picked up by [employer] to be brought to the [facility]. ? ?How Long Has This Been Causing You Problems? 1 wk - 1 month ? ?What Do You Feel Would Help You the Most Today? Alcohol or Drug Use Treatment; Treatment for Depression or other mood problem; Medication(s) ? ? ?Have You Recently Had Any Thoughts About Hurting Yourself? Yes ? ?Are You Planning to Commit Suicide/Harm Yourself At This time? Yes ? ? ?Have you Recently Had Thoughts About Hurting Someone [name]? No ? ?Are You Planning to Harm Someone at This Time? No ? ?Explanation: No data recorded ? ?Have You Used Any Alcohol or Drugs in the Past 24 Hours? Yes ? ?How Long Ago Did You Use Drugs or Alcohol? No data recorded ?What Did You Use and How Much? Pt reports he has been drinking beer ? ? ?Do You Currently Have a Therapist/Psychiatrist? No ? ?Name of Therapist/Psychiatrist: No data recorded ? ?Have You Been Recently Discharged From Any Office Practice or Programs? No ? ?Explanation of Discharge From Practice/Program: No data recorded ? ?  ?CCA Screening Triage Referral  Assessment ?Type of Contact: Face-to-Face ? ?Telemedicine Service Delivery:   ?Is this Initial or Reassessment? Initial Assessment ? ?Date Telepsych consult ordered in [facility]:  01/12/22 ? ?Time Telepsych consult ordered in [facility]:

## 2022-01-12 NOTE — BH Assessment (Signed)
[facility]  ?

## 2022-01-12 NOTE — Progress Notes (Signed)
TRIAGE: ROUTINE ? ? 01/12/22 0600  ?[facility] Triage Screening (Walk-ins at [facility] only)  ?How Did You Hear About [place]? Self  ?What Is the Reason for Your Visit/Call Today? Pt called the crisis line requesting assistance due to feeling like he might harm himself. Pt stated he was going to use the bus to come to the [facility] but, when asked if he could keep himself safe until the bus was running, pt expressed uncertainty, so clinician encouraged pt to call the police to have him brought to the [facility]. Pt showed up a short time later (approx 40 minutes later). Clinician attempted to complete the full CCA but pt was irritable, argumentative, and back-talked clinician throughout the entirety of the assessment. Pt endorses SI, stating he has cut and stabbed himself in the past and he will do it again. When asked when pt cut himself and stabbed himself he stated "2010." Pt states he got out of prison after 12 years and 7 months in either January or February 2023 and "has nothing - they kicked me out without anything." Pt states he has attempted to kill himself in the past but is unable to identify when. He states he's been hospitalized at [facility] 2x and [facility] 2x. When asked if he has a plan to kill himself, pt responded, "I already told you! If I had a gun I'd have already shot myself!" When clinician asked if pt has access to a gun he denies this. Pt denies HI, VH, and current NSSIB. When asked if pt has access to any weapons, he said, "why are you asking me the same thing?!?!" Clinician asked if pt had access to any *weapons*, such as a knife. Pt then growled, "I already told you - I hid it in the woods." Clinician informed pt that he had not, infact, informed her that he had hidden a knife in the woods. Pt verified he hid the knife with the intention of going back to get it. Pt endorses experiencing AH, stating, "I see things." Pt shares he drinks 6 40's of EtOH on a daily basis; he states he was drinking up until the time he  was picked up by [employer] to be brought to the [facility].  ?How Long Has This Been Causing You Problems? 1 wk - 1 month  ?Have You Recently Had Any Thoughts About Hurting Yourself? Yes  ?How long ago did you have thoughts about hurting yourself? Currently  ?Are You Planning to Commit Suicide/Harm Yourself At This time? Yes  ?Have you Recently Had Thoughts About Hurting Someone [name]? No  ?How long ago did you have thoughts of harming others? Several days ago  ?Are You Planning To Harm Someone At This Time? No  ?Are you currently experiencing any auditory, visual or other hallucinations? Yes  ?Please explain the hallucinations you are currently experiencing: Pt shares he "sees things."  ?Have You Used Any Alcohol or Drugs in the Past 24 Hours? Yes  ?How long ago did you use Drugs or Alcohol? Just prior to coming to the [facility]  ?What Did You Use and How Much? Pt reports he has been drinking beer  ?Do you have any current medical co-morbidities that require immediate attention? No  ?Clinician description of patient physical appearance/behavior: Pt is wearing dirty clothes. He is ornery, irritable, argumentative, and demanding.  ?What Do You Feel Would Help You the Most Today? Alcohol or Drug Use Treatment;Treatment for Depression or other mood problem;Medication(s)  ?If access to [facility] was  not available, would you have sought care in the Emergency Department? Yes  ?Determination of Need Urgent (48 hours)  ?Options For Referral [facility] Urgent Care;Medication Management;Outpatient Therapy  ? ? ?

## 2022-01-22 ENCOUNTER — Encounter (HOSPITAL_COMMUNITY): Payer: Self-pay | Admitting: *Deleted

## 2022-01-22 ENCOUNTER — Emergency Department (HOSPITAL_COMMUNITY)
Admission: EM | Admit: 2022-01-22 | Discharge: 2022-01-22 | Disposition: A | Discharge status: Home / Self Care | Payer: Medicaid Other | Attending: Emergency Medicine | Admitting: Emergency Medicine

## 2022-01-22 ENCOUNTER — Other Ambulatory Visit: Payer: Self-pay

## 2022-01-22 DIAGNOSIS — Y906 Blood alcohol level of 120-199 mg/100 ml: Secondary | ICD-10-CM | POA: Diagnosis not present

## 2022-01-22 DIAGNOSIS — I10 Essential (primary) hypertension: Secondary | ICD-10-CM | POA: Diagnosis not present

## 2022-01-22 DIAGNOSIS — D696 Thrombocytopenia, unspecified: Secondary | ICD-10-CM | POA: Insufficient documentation

## 2022-01-22 DIAGNOSIS — R45851 Suicidal ideations: Secondary | ICD-10-CM | POA: Diagnosis not present

## 2022-01-22 DIAGNOSIS — F1090 Alcohol use, unspecified, uncomplicated: Secondary | ICD-10-CM | POA: Insufficient documentation

## 2022-01-22 DIAGNOSIS — Z79899 Other long term (current) drug therapy: Secondary | ICD-10-CM | POA: Diagnosis not present

## 2022-01-22 DIAGNOSIS — R1031 Right lower quadrant pain: Secondary | ICD-10-CM | POA: Insufficient documentation

## 2022-01-22 DIAGNOSIS — G8929 Other chronic pain: Secondary | ICD-10-CM | POA: Insufficient documentation

## 2022-01-22 DIAGNOSIS — R7401 Elevation of levels of liver transaminase levels: Secondary | ICD-10-CM | POA: Insufficient documentation

## 2022-01-22 DIAGNOSIS — R1011 Right upper quadrant pain: Secondary | ICD-10-CM | POA: Diagnosis not present

## 2022-01-22 DIAGNOSIS — Z789 Other specified health status: Secondary | ICD-10-CM

## 2022-01-22 LAB — CBC
HCT: 43.9 % (ref 39.0–52.0)
Hemoglobin: 13.1 g/dL (ref 13.0–17.0)
MCH: 22.4 pg — ABNORMAL LOW (ref 26.0–34.0)
MCHC: 29.8 g/dL — ABNORMAL LOW (ref 30.0–36.0)
MCV: 75.2 fL — ABNORMAL LOW (ref 80.0–100.0)
Platelets: 128 10*3/uL — ABNORMAL LOW (ref 150–400)
RBC: 5.84 MIL/uL — ABNORMAL HIGH (ref 4.22–5.81)
RDW: 19.7 % — ABNORMAL HIGH (ref 11.5–15.5)
WBC: 4.7 10*3/uL (ref 4.0–10.5)
nRBC: 0 % (ref 0.0–0.2)

## 2022-01-22 LAB — URINALYSIS, ROUTINE W REFLEX MICROSCOPIC
Bilirubin Urine: NEGATIVE
Glucose, UA: NEGATIVE mg/dL
Hgb urine dipstick: NEGATIVE
Ketones, ur: NEGATIVE mg/dL
Leukocytes,Ua: NEGATIVE
Nitrite: NEGATIVE
Protein, ur: NEGATIVE mg/dL
Specific Gravity, Urine: 1.002 — ABNORMAL LOW (ref 1.005–1.030)
pH: 5 (ref 5.0–8.0)

## 2022-01-22 LAB — RAPID URINE DRUG SCREEN, HOSP PERFORMED
Amphetamines: NOT DETECTED
Barbiturates: NOT DETECTED
Benzodiazepines: NOT DETECTED
Cocaine: NOT DETECTED
Opiates: NOT DETECTED
Tetrahydrocannabinol: NOT DETECTED

## 2022-01-22 LAB — COMPREHENSIVE METABOLIC PANEL
ALT: 31 U/L (ref 0–44)
AST: 43 U/L — ABNORMAL HIGH (ref 15–41)
Albumin: 3.7 g/dL (ref 3.5–5.0)
Alkaline Phosphatase: 74 U/L (ref 38–126)
Anion gap: 9 (ref 5–15)
BUN: 10 mg/dL (ref 8–23)
CO2: 24 mmol/L (ref 22–32)
Calcium: 8.9 mg/dL (ref 8.9–10.3)
Chloride: 105 mmol/L (ref 98–111)
Creatinine, Ser: 0.87 mg/dL (ref 0.61–1.24)
GFR, Estimated: >60 mL/min (ref >60)
Glucose, Bld: 90 mg/dL (ref 70–99)
Potassium: 3.4 mmol/L — ABNORMAL LOW (ref 3.5–5.1)
Sodium: 138 mmol/L (ref 135–145)
Total Bilirubin: 2 mg/dL — ABNORMAL HIGH (ref 0.3–1.2)
Total Protein: 7.9 g/dL (ref 6.5–8.1)

## 2022-01-22 LAB — AMMONIA: Ammonia: 18 umol/L (ref 9–35)

## 2022-01-22 LAB — SALICYLATE LEVEL: Salicylate Lvl: <7 mg/dL — ABNORMAL LOW (ref 7.0–30.0)

## 2022-01-22 LAB — ETHANOL: Alcohol, Ethyl (B): 134 mg/dL — ABNORMAL HIGH (ref <10)

## 2022-01-22 LAB — ACETAMINOPHEN LEVEL: Acetaminophen (Tylenol), Serum: <10 ug/mL — ABNORMAL LOW (ref 10–30)

## 2022-01-22 LAB — LIPASE, BLOOD: Lipase: 44 U/L (ref 11–51)

## 2022-01-22 NOTE — ED Triage Notes (Signed)
Pt comes in for ETOH detox, also has pain with urination and "Liver" pain, history of drug and ETOH abuse ?

## 2022-01-22 NOTE — ED Notes (Signed)
Patient requested food. RN gave him food and a beverage. RN also brought in scrubs and asked the patient to change out explaining it was part of the process. Patient refused saying he wanted help and if someone didn't come to help him he was going to leave. ? ?Patient attempted to leave after telling multiple people that if he didn't get help he was going to leave and cut himself open with a knife. PA and security present when patient attempted to leave. PA to take out IVC paperwork.  ? ?RN asked the patient if he would move to a room with greater visibility. Patient refused.  ?

## 2022-01-22 NOTE — Discharge Instructions (Signed)
You came to the emerge apartment today to be evaluated for your chronic abdominal pain and alcohol use.  Your physical exam and lab results were reassuring.  Please follow-up with the resources given to you about counseling and substance use. ? ?Please return to the emergency department if you have any new or worsening symptoms, suicidal ideations, homicidal ideations, auditory or visual hallucinations. ?

## 2022-01-22 NOTE — ED Notes (Signed)
Patient left before complete discharge process could be completed. RN attempted to give him his AVS paperwork with discharge instructions and resources. Patient said he could not read the paperwork and left it. Another RN gave him the ACT team number. He agreed to call.  ?

## 2022-01-22 NOTE — ED Notes (Signed)
Patient has now recanted all statements of wanting to hurt himself.  ?

## 2022-01-22 NOTE — ED Provider Notes (Signed)
?[facility]-EMERGENCY DEPT ?Provider Note ? ? ?CSN: [id number] ?Arrival date & time: 01/22/22  1118 ? ?  ? ?History ? ?Chief Complaint  ?Patient presents with  ? Abdominal Pain  ? ? ?[name] CornfieldRandy R [name] is a 66 y.o. male with past medical history of chronic hep C, cirrhosis of liver, pancreatitis, hypertension, depression, anxiety, previous suicide attempt, alcohol use disorder, status post laparotomy. ? ?Presents to the emergency department with a chief complaint of suicidal ideation and chronic abdominal pain.  Patient reports that he has pain to his right upper quadrant and lower abdomen.  States that this pain has been present for multiple months and is unchanged.  Patient attributes his right upper quadrant pain to his cirrhosis and lower abdominal pain due to hernia and history of abdominal surgery.  Patient does report that his lower abdomen hurts when he urinates.  He reports this is a chronic issue for him.  Additionally patient endorses constipation.  States last bowel movement was 3 days prior.  Patient is passing flatus.  Patient denies any associated nausea, vomiting, melena, blood in stool, dysuria, hematuria, swelling or tenderness genitals, genital sores or lesions, fever, chills. ? ?Patient is requesting detox from alcohol.  Last alcohol use was 20 minutes prior.  Patient states that in the last 24 hours she had five 40oz beers.  Patient states that he went to behavioral urgent care for detox and they were unable to help him.  Patient states that "I do not want to live like this anymore."  Additionally he states "if I am not able to go to rehab facility I will go outside and use a knife to gut myself."  Patient endorses history of previous suicide attempts via overdose and stabbing.  Denies any HI, AVH.  Denies any illicit drug use. ? ? ?Abdominal Pain ?Associated symptoms: constipation   ?Associated symptoms: no chest pain, no chills, no diarrhea, no dysuria, no fever, no hematuria, no  nausea, no shortness of breath and no vomiting   ? ?  ? ?Home Medications ?Prior to Admission medications   ?Medication Sig Start Date End Date Taking? Authorizing Provider  ?acetaminophen (TYLENOL) 325 MG tablet Take 2 tablets (650 mg total) by mouth 4 (four) times daily as needed for mild pain or moderate pain. ?Patient not taking: Reported on 12/05/2021 02/11/21   Alphonzo SeveranceWatson, Julia, MD  ?amLODipine (NORVASC) 5 MG tablet Take 5 mg by mouth daily. ?Patient not taking: Reported on 01/05/2022    [provider]  ?ARIPiprazole (ABILIFY) 15 MG tablet Take 1 tablet (15 mg total) by mouth at bedtime. ?Patient not taking: Reported on 12/05/2021 05/13/21   Storm FriskWright, Patrick E, MD  ?benzoyl peroxide 5 % gel Apply 1 application topically 2 (two) times daily. ?Patient not taking: Reported on 12/05/2021 02/11/21   Alphonzo SeveranceWatson, Julia, MD  ?cetirizine (ZYRTEC) 10 MG tablet Take 10 mg by mouth daily. ?Patient not taking: Reported on 01/05/2022    [provider]  ?docusate sodium (COLACE) 100 MG capsule Take 100 mg by mouth daily. ?Patient not taking: Reported on 01/05/2022    [provider]  ?ferrous sulfate 325 (65 FE) MG tablet Take 1 tablet (325 mg total) by mouth daily. ?Patient not taking: Reported on 12/05/2021 05/13/21 09/10/21  Storm FriskWright, Patrick E, MD  ?folic acid (FOLVITE) 1 MG tablet Take 1 tablet (1 mg total) by mouth daily. ?Patient not taking: Reported on 12/05/2021 05/13/21   Storm FriskWright, Patrick E, MD  ?furosemide (LASIX) 20 MG tablet Take  20 mg by mouth daily. ?Patient not taking: Reported on 01/05/2022    [provider]  ?lactulose, encephalopathy, (CHRONULAC) 10 GM/15ML SOLN Take 30 mLs (20 g total) by mouth 2 (two) times daily. ?Patient not taking: Reported on 12/05/2021 05/13/21   Storm Frisk, MD  ?levothyroxine (SYNTHROID) 50 MCG tablet Take 50 mcg by mouth daily before breakfast. ?Patient not taking: Reported on 01/05/2022    [provider]  ?lidocaine (LIDODERM) 5 % Place 1 patch onto  the skin daily. Remove & Discard patch within 12 hours or as directed by MD ?Patient not taking: Reported on 12/05/2021 02/11/21   Alphonzo Severance, MD  ?Multiple Vitamin (MULTIVITAMIN WITH MINERALS) TABS tablet Take 1 tablet by mouth daily. ?Patient not taking: Reported on 12/05/2021 02/11/21   Alphonzo Severance, MD  ?omeprazole (PRILOSEC) 20 MG capsule Take 1 capsule (20 mg total) by mouth 2 (two) times daily as needed (acid reflux). ?Patient not taking: Reported on 12/05/2021 05/13/21   Storm Frisk, MD  ?pantoprazole (PROTONIX) 20 MG tablet Take 20 mg by mouth daily. ?Patient not taking: Reported on 01/05/2022    [provider]  ?tamsulosin (FLOMAX) 0.4 MG CAPS capsule Take 1 capsule (0.4 mg total) by mouth daily. ?Patient not taking: Reported on 12/05/2021 05/13/21   Storm Frisk, MD  ?tamsulosin (FLOMAX) 0.4 MG CAPS capsule Take 0.4 mg by mouth daily. ?Patient not taking: Reported on 01/05/2022    [provider]  ?thiamine 100 MG tablet Take 1 tablet (100 mg total) by mouth daily. ?Patient not taking: Reported on 12/05/2021 05/13/21   Storm Frisk, MD  ?   ? ?Allergies    ?Fish allergy and Carrot [daucus carota]   ? ?Review of Systems   ?Review of Systems  ?Constitutional:  Negative for chills and fever.  ?Eyes:  Negative for visual disturbance.  ?Respiratory:  Negative for shortness of breath.   ?Cardiovascular:  Negative for chest pain.  ?Gastrointestinal:  Positive for abdominal pain and constipation. Negative for abdominal distention, anal bleeding, blood in stool, diarrhea, nausea, rectal pain and vomiting.  ?Genitourinary:  Negative for difficulty urinating, dysuria, flank pain, frequency, genital sores, hematuria, penile discharge, penile pain, penile swelling, scrotal swelling, testicular pain and urgency.  ?Musculoskeletal:  Negative for back pain and neck pain.  ?Skin:  Negative for color change and rash.  ?Neurological:  Negative for dizziness, syncope, light-headedness and  headaches.  ?Psychiatric/Behavioral:  Positive for suicidal ideas. Negative for confusion, hallucinations and self-injury.   ? ?Physical Exam ?Updated Vital Signs ?BP (!) 172/86 (BP Location: Right Arm)   Pulse 75   Temp 98 ?F (36.7 ?C) (Oral)   Resp 18   Ht 5\' 11"  (1.803 m)   Wt 84.8 kg   SpO2 100%   BMI 26.08 kg/m?  ?Physical Exam ?Vitals and nursing note reviewed.  ?Constitutional:   ?   General: He is not in acute distress. ?   Appearance: He is not ill-appearing, toxic-appearing or diaphoretic.  ?HENT:  ?   Head: Normocephalic.  ?Eyes:  ?   General: No scleral icterus.    ?   Right eye: No discharge.     ?   Left eye: No discharge.  ?Cardiovascular:  ?   Rate and Rhythm: Normal rate.  ?Pulmonary:  ?   Effort: Pulmonary effort is normal. No tachypnea, bradypnea or respiratory distress.  ?   Breath sounds: Normal breath sounds. No stridor.  ?Abdominal:  ?   General: Abdomen is  protuberant. A surgical scar is present. Bowel sounds are normal. There is no distension. There are no signs of injury.  ?   Palpations: There is no mass or pulsatile mass.  ?   Tenderness: There is no abdominal tenderness. There is no right CVA tenderness, left CVA tenderness, guarding or rebound.  ?   Hernia: There is no hernia in the umbilical area or ventral area.  ?   Comments: Patient has large midline surgical scar.  ?Skin: ?   General: Skin is warm and dry.  ?Neurological:  ?   General: No focal deficit present.  ?   Mental Status: He is alert.  ?   GCS: GCS eye subscore is 4. GCS verbal subscore is 5. GCS motor subscore is 6.  ?Psychiatric:     ?   Attention and Perception: He is attentive. He does not perceive auditory or visual hallucinations.     ?   Behavior: Behavior is cooperative.     ?   Thought Content: Thought content is not paranoid or delusional. Thought content includes suicidal ideation. Thought content does not include homicidal ideation. Thought content includes suicidal plan. Thought content does not include  homicidal plan.  ? ? ?ED Results / Procedures / Treatments   ?Labs ?(all labs ordered are listed, but only abnormal results are displayed) ?Labs Reviewed  ?COMPREHENSIVE METABOLIC PANEL - Abnormal; Notable for the fo

## 2022-01-23 LAB — URINE CULTURE: Culture: NO GROWTH

## 2022-03-31 ENCOUNTER — Other Ambulatory Visit: Payer: Self-pay

## 2022-03-31 ENCOUNTER — Emergency Department (HOSPITAL_COMMUNITY): Payer: Medicare Other

## 2022-03-31 ENCOUNTER — Ambulatory Visit (HOSPITAL_COMMUNITY)
Admission: EM | Admit: 2022-03-31 | Discharge: 2022-03-31 | Discharge status: Against Medical Advice | Payer: Medicare Other

## 2022-03-31 ENCOUNTER — Emergency Department (HOSPITAL_COMMUNITY)
Admission: EM | Admit: 2022-03-31 | Discharge: 2022-04-02 | Disposition: A | Discharge status: Other Acute Inpatient Hospital | Payer: Medicare Other | Attending: Emergency Medicine | Admitting: Emergency Medicine

## 2022-03-31 ENCOUNTER — Encounter (HOSPITAL_COMMUNITY): Payer: Self-pay

## 2022-03-31 DIAGNOSIS — R45851 Suicidal ideations: Secondary | ICD-10-CM

## 2022-03-31 DIAGNOSIS — F329 Major depressive disorder, single episode, unspecified: Secondary | ICD-10-CM | POA: Insufficient documentation

## 2022-03-31 DIAGNOSIS — Z20822 Contact with and (suspected) exposure to covid-19: Secondary | ICD-10-CM | POA: Insufficient documentation

## 2022-03-31 DIAGNOSIS — I10 Essential (primary) hypertension: Secondary | ICD-10-CM | POA: Insufficient documentation

## 2022-03-31 DIAGNOSIS — Y907 Blood alcohol level of 200-239 mg/100 ml: Secondary | ICD-10-CM | POA: Insufficient documentation

## 2022-03-31 DIAGNOSIS — F1020 Alcohol dependence, uncomplicated: Secondary | ICD-10-CM | POA: Diagnosis not present

## 2022-03-31 DIAGNOSIS — Z046 Encounter for general psychiatric examination, requested by authority: Secondary | ICD-10-CM | POA: Diagnosis present

## 2022-03-31 HISTORY — DX: Disorder of thyroid, unspecified: E07.9

## 2022-03-31 LAB — COMPREHENSIVE METABOLIC PANEL
ALT: 35 U/L (ref 0–44)
AST: 56 U/L — ABNORMAL HIGH (ref 15–41)
Albumin: 3.5 g/dL (ref 3.5–5.0)
Alkaline Phosphatase: 61 U/L (ref 38–126)
Anion gap: 13 (ref 5–15)
BUN: 9 mg/dL (ref 8–23)
CO2: 19 mmol/L — ABNORMAL LOW (ref 22–32)
Calcium: 8.6 mg/dL — ABNORMAL LOW (ref 8.9–10.3)
Chloride: 108 mmol/L (ref 98–111)
Creatinine, Ser: 0.86 mg/dL (ref 0.61–1.24)
GFR, Estimated: >60 mL/min (ref >60)
Glucose, Bld: 84 mg/dL (ref 70–99)
Potassium: 3.6 mmol/L (ref 3.5–5.1)
Sodium: 140 mmol/L (ref 135–145)
Total Bilirubin: 2 mg/dL — ABNORMAL HIGH (ref 0.3–1.2)
Total Protein: 7.2 g/dL (ref 6.5–8.1)

## 2022-03-31 LAB — RAPID URINE DRUG SCREEN, HOSP PERFORMED
Amphetamines: NOT DETECTED
Barbiturates: NOT DETECTED
Benzodiazepines: NOT DETECTED
Cocaine: NOT DETECTED
Opiates: NOT DETECTED
Tetrahydrocannabinol: NOT DETECTED

## 2022-03-31 LAB — CBC
HCT: 38.5 % — ABNORMAL LOW (ref 39.0–52.0)
Hemoglobin: 11.8 g/dL — ABNORMAL LOW (ref 13.0–17.0)
MCH: 23.1 pg — ABNORMAL LOW (ref 26.0–34.0)
MCHC: 30.6 g/dL (ref 30.0–36.0)
MCV: 75.5 fL — ABNORMAL LOW (ref 80.0–100.0)
Platelets: 102 10*3/uL — ABNORMAL LOW (ref 150–400)
RBC: 5.1 MIL/uL (ref 4.22–5.81)
RDW: 19.4 % — ABNORMAL HIGH (ref 11.5–15.5)
WBC: 4.4 10*3/uL (ref 4.0–10.5)
nRBC: 0 % (ref 0.0–0.2)

## 2022-03-31 LAB — ETHANOL: Alcohol, Ethyl (B): 203 mg/dL — ABNORMAL HIGH (ref <10)

## 2022-03-31 LAB — ACETAMINOPHEN LEVEL: Acetaminophen (Tylenol), Serum: <10 ug/mL — ABNORMAL LOW (ref 10–30)

## 2022-03-31 LAB — SARS CORONAVIRUS 2 BY RT PCR: SARS Coronavirus 2 by RT PCR: NEGATIVE

## 2022-03-31 LAB — SALICYLATE LEVEL: Salicylate Lvl: <7 mg/dL — ABNORMAL LOW (ref 7.0–30.0)

## 2022-03-31 MED ORDER — ZIPRASIDONE MESYLATE 20 MG IM SOLR: 20.0000 mg | Freq: Once | INTRAMUSCULAR | Status: AC

## 2022-03-31 MED ORDER — DIPHENHYDRAMINE HCL 50 MG/ML IJ SOLN
50.0000 mg | Freq: Once | INTRAMUSCULAR | Status: AC
  Administered 2022-03-31: 50 mg via INTRAMUSCULAR
  Filled 2022-03-31: qty 1

## 2022-03-31 MED ORDER — ZIPRASIDONE MESYLATE 20 MG IM SOLR
INTRAMUSCULAR | Status: AC
  Administered 2022-03-31: 20 mg via INTRAMUSCULAR
  Filled 2022-03-31: qty 20

## 2022-03-31 MED ORDER — LORAZEPAM 2 MG/ML IJ SOLN
2.0000 mg | Freq: Once | INTRAMUSCULAR | Status: AC
  Administered 2022-03-31: 2 mg via INTRAMUSCULAR
  Filled 2022-03-31: qty 1

## 2022-03-31 MED ORDER — STERILE WATER FOR INJECTION IJ SOLN
INTRAMUSCULAR | Status: AC
  Administered 2022-03-31: 1.2 mL
  Filled 2022-03-31: qty 10

## 2022-03-31 NOTE — ED Notes (Signed)
Pt in exam room hitting table, verbally redirected to stop. MD aware.

## 2022-03-31 NOTE — ED Notes (Signed)
Patient cooperative at this time.  Able to obtain labs

## 2022-03-31 NOTE — ED Notes (Signed)
Patient getting aggressive and trying to leave PD with patient.  AC notified of need for sitter.  No sitters available at this time.

## 2022-03-31 NOTE — ED Triage Notes (Addendum)
Pt brought in by [employer] for SI. The patient called mobile crisis who then called police. He was stating he is tired of living, he wanted to "gut himself" and continues to state he wants to leave and is argumentative with [employer] and this RN, continues to state 'I dont want to live." He reports "the devil is telling me to kill somebody and God is telling me to live." He reports visual hallucinations but will not elaborate. He reports drinking "about six 40s." Denies illegal drug use. Hx of Bipolar but states he has not taken his meds in 4 months.  One pocket knife retrieved from the patients belongings, sent to security.

## 2022-03-31 NOTE — ED Provider Triage Note (Signed)
Emergency Medicine Provider Triage Evaluation Note  [name] , a 66 y.o. male  was evaluated in triage.  Pt complains of being suicidal  Pt going to take 9 people out with him.    Review of Systems  Positive: Pt refuses to answer Negative:   Physical Exam  BP (!) 162/86 (BP Location: Right Arm)   Pulse 68   Temp 98.2 F (36.8 C) (Oral)   Resp 18   SpO2 98%  Gen:   Awake, Pt threatening police.   Resp:  Normal effort  MSK:   Moves extremities without difficulty  Other:    Medical Decision Making  Medically screening exam initiated at 5:41 PM.  Appropriate orders placed.  [name] was informed that the remainder of the evaluation will be completed by another provider, this initial triage assessment does not replace that evaluation, and the importance of remaining in the ED until their evaluation is complete.  Pt has IVC    Elson Areas, [place] 03/31/22 1743

## 2022-03-31 NOTE — ED Notes (Signed)
While pt was changing into burgundy scrubs, pt attempted to hide a razor blade underneath scrub shirt. This RN took razor blade and disposed of it in sharps container and informed [employer]. When asked if he had anymore razor blades pt stated, "I have one in my asshole." DR notified, security notified to come wand pt.

## 2022-03-31 NOTE — ED Provider Notes (Signed)
Emergency Department Provider Note   I have reviewed the triage vital signs and the nursing notes.   HISTORY  Chief Complaint Suicidal   HPI [name] is a 66 y.o. male past medical history reviewed presents to the emergency department with report of suicidal ideation.  Patient states he has been drinking today.  He apparently called the mobile crisis line and told them that he wanted to "gut himself."  He also apparently told staff today that "the devil is telling me to kill somebody."  He denies feeling this way to me but was very aggressive and agitated during the initial ED evaluation and required Geodon.  Nursing staff found a razor blade in his possession and removed it from him.  He insinuated to them that he was hiding another blade, potentially "up my butt" but denies this to me.    Past Medical History:  Diagnosis Date   Alcohol abuse    Anxiety    Cirrhosis (HCC)    Depression    Hep C w/o coma, chronic (HCC)    Hepatitis C    Hypertension    Liver cirrhosis (HCC)    Pancreatitis    Suicide attempt (HCC)     Review of Systems  Constitutional: No fever/chills Eyes: No visual changes. ENT: No sore throat. Cardiovascular: Denies chest pain. Respiratory: Denies shortness of breath. Gastrointestinal: No abdominal pain.  No nausea, no vomiting.  No diarrhea.  No constipation. Genitourinary: Negative for dysuria. Musculoskeletal: Negative for back pain. Skin: Negative for rash. Neurological: Negative for headaches, focal weakness or numbness. Psychiatric: Positive SI/HI.    ____________________________________________   PHYSICAL EXAM:  VITAL SIGNS: ED Triage Vitals  Enc Vitals Group     BP 03/31/22 1724 (!) 162/86     Pulse Rate 03/31/22 1724 68     Resp 03/31/22 1724 18     Temp 03/31/22 1724 98.2 F (36.8 C)     Temp Source 03/31/22 1724 Oral     SpO2 03/31/22 1724 98 %   Constitutional: Alert.  Able to verbally redirect but intermittently  becomes more agitated.  Not requiring physical restraint at this time but did receive Geodon prior to my evaluation. Eyes: Conjunctivae are normal.  Head: Atraumatic. Nose: No congestion/rhinnorhea. Mouth/Throat: Mucous membranes are moist. Neck: No stridor.   Cardiovascular: Good peripheral circulation.   Respiratory: Normal respiratory effort.   Gastrointestinal: No distention.  Musculoskeletal: No lower extremity tenderness nor edema. No gross deformities of extremities. Neurologic:  Normal speech and language. No gross focal neurologic deficits are appreciated.  Skin:  Skin is warm, dry and intact. No rash noted. Psychiatric: Mood and affect are somewhat belligerent and agitated on my evaluation.   ____________________________________________   LABS (all labs ordered are listed, but only abnormal results are displayed)  Labs Reviewed  COMPREHENSIVE METABOLIC PANEL - Abnormal; Notable for the following components:      Result Value   CO2 19 (*)    Calcium 8.6 (*)    AST 56 (*)    Total Bilirubin 2.0 (*)    All other components within normal limits  CBC - Abnormal; Notable for the following components:   Hemoglobin 11.8 (*)    HCT 38.5 (*)    MCV 75.5 (*)    MCH 23.1 (*)    RDW 19.4 (*)    Platelets 102 (*)    All other components within normal limits  SARS CORONAVIRUS 2 BY RT PCR  RESP PANEL BY RT-PCR (  FLU A&B, COVID) ARPGX2  RAPID URINE DRUG SCREEN, HOSP PERFORMED  ETHANOL  SALICYLATE LEVEL  ACETAMINOPHEN LEVEL   ____________________________________________  RADIOLOGY  DG Pelvis Portable  Result Date: 03/31/2022 CLINICAL DATA:  Question concealing razor blade. EXAM: PORTABLE PELVIS 1-2 VIEWS COMPARISON:  None Available. FINDINGS: No radiopaque foreign body. The cortical margins of the bony pelvis are intact. No fracture. Pubic symphysis and sacroiliac joints are congruent. Both femoral heads are well-seated in the respective acetabula. IMPRESSION: No radiopaque  foreign body. Unremarkable radiographic appearance of the pelvis. Electronically Signed   By: Narda Rutherford M.D.   On: 03/31/2022 18:51    ____________________________________________   PROCEDURES  Procedure(s) performed:   Procedures  CRITICAL CARE Performed by: Maia Plan Total critical care time: 35 minutes Critical care time was exclusive of separately billable procedures and treating other patients. Critical care was necessary to treat or prevent imminent or life-threatening deterioration. Critical care was time spent personally by me on the following activities: development of treatment plan with patient and/or surrogate as well as nursing, discussions with consultants, evaluation of patient's response to treatment, examination of patient, obtaining history from patient or surrogate, ordering and performing treatments and interventions, ordering and review of laboratory studies, ordering and review of radiographic studies, pulse oximetry and re-evaluation of patient's condition.  Alona Bene, MD Emergency Medicine  ____________________________________________   INITIAL IMPRESSION / ASSESSMENT AND PLAN / ED COURSE  Pertinent labs & imaging results that were available during my care of the patient were reviewed by me and considered in my medical decision making (see chart for details).   This patient is Presenting for Evaluation of SI/HI, which does require a range of treatment options, and is a complaint that involves a high risk of morbidity and mortality.  The Differential Diagnoses include depression/anxiety, polysubstance abuse, substance abuse mood disorder, psychosis.   Critical Interventions-    Medications  ziprasidone (GEODON) injection 20 mg (20 mg Intramuscular Given 03/31/22 1747)  sterile water (preservative free) injection (1.2 mLs  Given 03/31/22 1747)  LORazepam (ATIVAN) injection 2 mg (2 mg Intramuscular Given 03/31/22 1911)  diphenhydrAMINE (BENADRYL)  injection 50 mg (50 mg Intramuscular Given 03/31/22 1911)    Reassessment after intervention:  Patient calm and cooperative.    I did obtain Additional Historical Information from Police at bedside.  I decided to review pertinent External Data, and in summary patient last evaluated by behavioral health in our system on 3/29.  Did not meet inpatient criteria at that time.  Noted to have alcohol use disorder as well as untreated bipolar disorder.   Clinical Laboratory Tests Ordered, included no leukocytosis.  Mild anemia similar to prior.  No acute kidney injury.  UDS negative  Radiologic Tests Ordered, included pelvis x-ray. I independently interpreted the images and agree with radiology interpretation.    Social Determinants of Health Risk positive for EtOH abuse.   Consult complete with TTS. Awaiting evaluation and disposition.   Medical Decision Making: Summary:  Patient arrives with SI/HI.  He is accompanied by police and required IM Geodon prior to my evaluation.  He is under IVC.  I completed the paperwork here in the emergency department and it has been filed.  He claimed to nursing that he may have another razor blade, potentially hidden in his rectum.  I feel this is not likely based on exam but will obtain a plain film of the pelvis to rule out metallic foreign body and obtain screening blood work.   Reevaluation  with update and discussion with patient.  He is calm down significantly with sedation medication.  He has lab work which is near his baseline.  He is noncompliant with all of his home medications.  He is under IVC.  Do plan for TTS evaluation.  Disposition: pending TTS evaluation  ____________________________________________  FINAL CLINICAL IMPRESSION(S) / ED DIAGNOSES  Final diagnoses:  Suicidal ideation    Note:  This document was prepared using Dragon voice recognition software and may include unintentional dictation errors.  Alona Bene, MD, [facility] Emergency  Medicine    Vannia Pola, Arlyss Repress, MD 03/31/22 2256

## 2022-03-31 NOTE — ED Notes (Signed)
Pt wanded by security, belongings inventoried/placed in bag and placed in locker #3.

## 2022-04-01 DIAGNOSIS — F329 Major depressive disorder, single episode, unspecified: Secondary | ICD-10-CM | POA: Diagnosis not present

## 2022-04-01 MED ORDER — CHLORDIAZEPOXIDE HCL 25 MG PO CAPS
25.0000 mg | ORAL_CAPSULE | Freq: Three times a day (TID) | ORAL | Status: DC
  Administered 2022-04-02: 25 mg via ORAL
  Filled 2022-04-01 (×2): qty 1

## 2022-04-01 MED ORDER — THIAMINE HCL 100 MG PO TABS
100.0000 mg | ORAL_TABLET | Freq: Once | ORAL | Status: AC
  Administered 2022-04-01: 100 mg via ORAL
  Filled 2022-04-01: qty 1

## 2022-04-01 MED ORDER — CHLORDIAZEPOXIDE HCL 25 MG PO CAPS: 25.0000 mg | ORAL_CAPSULE | ORAL | Status: DC

## 2022-04-01 MED ORDER — CHLORDIAZEPOXIDE HCL 25 MG PO CAPS
25.0000 mg | ORAL_CAPSULE | Freq: Four times a day (QID) | ORAL | Status: AC
  Administered 2022-04-01 – 2022-04-02 (×4): 25 mg via ORAL
  Filled 2022-04-01 (×4): qty 1

## 2022-04-01 MED ORDER — ADULT MULTIVITAMIN W/MINERALS CH
1.0000 | ORAL_TABLET | Freq: Every day | ORAL | Status: DC
  Administered 2022-04-01 – 2022-04-02 (×2): 1 via ORAL
  Filled 2022-04-01 (×2): qty 1

## 2022-04-01 MED ORDER — CHLORDIAZEPOXIDE HCL 25 MG PO CAPS
25.0000 mg | ORAL_CAPSULE | Freq: Once | ORAL | Status: AC
Start: 2022-04-01 — End: 2022-04-01
  Administered 2022-04-01: 25 mg via ORAL
  Filled 2022-04-01: qty 1

## 2022-04-01 MED ORDER — CHLORDIAZEPOXIDE HCL 25 MG PO CAPS: 25.0000 mg | ORAL_CAPSULE | Freq: Every day | ORAL | Status: DC

## 2022-04-01 MED ORDER — HYDROXYZINE HCL 25 MG PO TABS
25.0000 mg | ORAL_TABLET | Freq: Four times a day (QID) | ORAL | Status: DC | PRN
  Administered 2022-04-02: 25 mg via ORAL
  Filled 2022-04-01: qty 1

## 2022-04-01 MED ORDER — CHLORDIAZEPOXIDE HCL 25 MG PO CAPS
25.0000 mg | ORAL_CAPSULE | Freq: Four times a day (QID) | ORAL | Status: DC | PRN
  Administered 2022-04-02: 25 mg via ORAL

## 2022-04-01 MED ORDER — ONDANSETRON 4 MG PO TBDP: 4.0000 mg | ORAL_TABLET | Freq: Four times a day (QID) | ORAL | Status: DC | PRN

## 2022-04-01 MED ORDER — PANTOPRAZOLE SODIUM 40 MG PO TBEC
40.0000 mg | DELAYED_RELEASE_TABLET | Freq: Every day | ORAL | Status: DC
  Administered 2022-04-01 – 2022-04-02 (×2): 40 mg via ORAL
  Filled 2022-04-01 (×2): qty 1

## 2022-04-01 MED ORDER — TAMSULOSIN HCL 0.4 MG PO CAPS: 0.4000 mg | ORAL_CAPSULE | Freq: Every day | ORAL | Status: DC

## 2022-04-01 MED ORDER — THIAMINE HCL 100 MG/ML IJ SOLN: 100.0000 mg | Freq: Once | INTRAMUSCULAR | Status: DC

## 2022-04-01 MED ORDER — LOPERAMIDE HCL 2 MG PO CAPS: 2.0000 mg | ORAL_CAPSULE | ORAL | Status: DC | PRN

## 2022-04-01 MED ORDER — THIAMINE HCL 100 MG PO TABS
100.0000 mg | ORAL_TABLET | Freq: Every day | ORAL | Status: DC
  Administered 2022-04-02: 100 mg via ORAL
  Filled 2022-04-01 (×2): qty 1

## 2022-04-01 MED ORDER — LEVOTHYROXINE SODIUM 50 MCG PO TABS
50.0000 ug | ORAL_TABLET | Freq: Every day | ORAL | Status: DC
  Administered 2022-04-02: 50 ug via ORAL
  Filled 2022-04-01: qty 1

## 2022-04-01 NOTE — Progress Notes (Signed)
Inpatient Behavioral Health Placement  Meets inpatient criteria per Lynne Leader, NP.There are no aviabale beds at [facility] per [facility] Rona Ravens, RN. Referral was sent to the following facilities;    Destination Service Provider Address Phone Fax  [facility]  [address], [place] [address] [phone]  [facility]  [address] [place] [address] [phone]  [facility] [address]  [address] [place] [address] [phone]  [facility]  [address], [place] [address] [phone]  [facility]  [address] [place] [address] [phone]  [facility]  [address] [place] [address] [phone]  [facility]  [address], [place] [address] [phone]  [facility]  [address] [place] [address] [phone]  [facility]  [address] [place], [place] [address] [phone]  [facility]  [address], [place] [address] [phone]  [facility]  [address] [place] [address] [phone]  [facility]  [address] [place] [address] [phone]  [facility]  [address], [place] [address] [phone]  [facility]  [address], [place] [address] [phone]  [facility]  [phone] [place] [address] [phone]   Situation ongoing,  CSW will follow up.   Maryjean Ka, MSW, [facility] 04/01/2022  @ 6:56 PM

## 2022-04-01 NOTE — Progress Notes (Addendum)
Pt was accept to [facility] 04/02/22 after 8:00am; Bed Assignment [facility]  Pt meets inpatient criteria per Lynne Leader, NP  Attending Physician will be Dr. Jola Babinski  Report can be called to: - [phone]  Pt can arrive after 8:00am  Nursing notified: Gretta Arab, RN  Kelton Pillar, LCSWA 04/01/2022 @ 8:38 PM

## 2022-04-01 NOTE — ED Notes (Signed)
Received call from [place] from [facility] asking questions regarding patient.  She is going to call SW with accepting information for patient to arrive tomorrow.

## 2022-04-01 NOTE — ED Notes (Signed)
Called Sheriff and left voicemail for transport in the morning. Sheriff called back confirming time.

## 2022-04-01 NOTE — ED Provider Notes (Signed)
Emergency Medicine Observation Re-evaluation Note  MOHAMAD BRUSO is a 66 y.o. male, seen on rounds today.  Pt initially presented to the ED for complaints of Suicidal Currently, the patient is resting comfortably.  Physical Exam  BP (!) 164/78 (BP Location: Right Arm)   Pulse 75   Temp 97.9 F (36.6 C) (Oral)   Resp 16   SpO2 95%  Physical Exam General: Nontoxic. Cardiac: Normal heart rate Lungs: Normal respiratory Psych: No internal responsiveness  ED Course / MDM  EKG:   I have reviewed the labs performed to date as well as medications administered while in observation.  Recent changes in the last 24 hours include he has been seen by TTS who recommended hospitalization.  Plan  Current plan is for psychiatric hospitalization. AMILCAR REEVER is under involuntary commitment.      Mancel Bale, MD 04/01/22 1450

## 2022-04-01 NOTE — BH Assessment (Signed)
Per Clois Comber, RN: "He had to be medicated on arrival and still is sedated." Clinician asked RN to let us know when the pt is alert and can engage in assessment.    Redmond Pulling, MS, [facility], [facility] Triage Specialist [phone]

## 2022-04-01 NOTE — ED Notes (Signed)
TTS in process 
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
## 2022-04-01 NOTE — BH Assessment (Signed)
Clinician messaged [name] Comber, RN: "Hey. It's [name] with TTS. Is the pt able to engage in the assessment, if so the pt will need to be placed in a private room. Also is the pt under IVC? Please fax IVC paperwork to [phone]. Is the pt medically cleared?"    Clinician awaiting response.    [name] Pulling, MS, [facility], [facility] Triage Specialist [phone]

## 2022-04-02 ENCOUNTER — Encounter (HOSPITAL_COMMUNITY): Payer: Self-pay | Admitting: *Deleted

## 2022-04-02 DIAGNOSIS — F329 Major depressive disorder, single episode, unspecified: Secondary | ICD-10-CM | POA: Diagnosis not present

## 2022-04-02 MED ORDER — NEPAFENAC 0.1 % OP SUSP
1.0000 [drp] | Freq: Two times a day (BID) | OPHTHALMIC | Status: DC
  Administered 2022-04-02: 1 [drp] via OPHTHALMIC
  Filled 2022-04-02: qty 3

## 2022-04-02 MED ORDER — PREDNISOLON-MOXIFLOX-NEPAFENAC 1-0.5-0.1 % OP SUSP: 1.0000 [drp] | OPHTHALMIC | Status: DC

## 2022-04-02 MED ORDER — GATIFLOXACIN 0.5 % OP SOLN
1.0000 [drp] | Freq: Two times a day (BID) | OPHTHALMIC | Status: DC
  Administered 2022-04-02: 1 [drp] via OPHTHALMIC
  Filled 2022-04-02: qty 2.5

## 2022-04-02 MED ORDER — PREDNISOLONE ACETATE 1 % OP SUSP
1.0000 [drp] | Freq: Two times a day (BID) | OPHTHALMIC | Status: DC
Start: 2022-04-02 — End: 2022-04-02
  Administered 2022-04-02: 1 [drp] via OPHTHALMIC
  Filled 2022-04-02: qty 5

## 2022-04-02 NOTE — ED Notes (Signed)
Attempted to call report to [phone].  Phone rung until a fast busy.   

## 2022-04-02 NOTE — ED Notes (Signed)
Lunch delivered/ provided 

## 2022-04-02 NOTE — ED Notes (Signed)
Called [facility] for transport, left message on [employer] machine.

## 2022-04-02 NOTE — ED Notes (Signed)
Called Sheriff's office second time.

## 2022-04-02 NOTE — ED Provider Notes (Signed)
Emergency Medicine Observation Re-evaluation Note  [name] is a 66 y.o. male, seen on rounds today.  Pt initially presented to the ED for complaints of Suicidal Currently, the patient is resting comfortably.  He is requesting that we give him his eyedrops.  Physical Exam  BP (!) 158/91   Pulse 63   Temp (!) 97.5 F (36.4 C) (Oral)   Resp 20   SpO2 97%  Physical Exam General: No distress Cardiac: Regular rate Lungs: No respiratory distress Psych: Calm  ED Course / MDM  EKG:   I have reviewed the labs performed to date as well as medications administered while in observation.  Recent changes in the last 24 hours include no new changes.  Plan  Current plan is for patient to be transferred to [facility].  Nursing staff informed me that they are waiting for sheriff. [name] is under involuntary commitment.      Derwood Kaplan, MD 04/02/22 1304

## 2022-04-02 NOTE — ED Notes (Signed)
SD here unannounced for pt transport, attempting to prepare pt for transport

## 2022-04-02 NOTE — ED Notes (Addendum)
Ambulatory out with SD, alert, NAD, calm, interactive, with belongings. With IVC, [place], medical necessity and transport paperwork.

## 2022-04-02 NOTE — ED Notes (Signed)
Breakfast served

## 2022-04-02 NOTE — ED Notes (Signed)
SD/ [employer] transport never arrived/ presented for transport to [employer]

## 2022-04-02 NOTE — ED Notes (Addendum)
Attempted to call report to [phone].  Phone rung until a fast busy.   

## 2022-04-02 NOTE — ED Notes (Addendum)
Confirm: eye surgery 6/5, Dr. Clelia Croft, [facility]. Taking mixed ABT eye gtt: TID for 1 week, BID for 1 week, starts QD for 1 week on 6/19.

## 2022-04-02 NOTE — ED Notes (Addendum)
Report received: Report to [facility] attempted by previous nurse unsuccessfully multiple times (x6), no answer. [facility] transport initiated.

## 2022-04-02 NOTE — ED Notes (Signed)
Attempted to call report x 2 to [phone].  Phone rung until a fast busy.

## 2022-04-02 NOTE — ED Notes (Addendum)
All belongings (2 bags, and valuables from security office), given to SD for transport with pt to [facility]. Includes 3 medication eye drops from [facility] ED pharmacy. Valuables envelope [id number] with wallet and phone given sealed to SD [facility]. Envelope [id number] with black pocketknife remains in ED unable to be located at time of d/c.

## 2022-04-02 NOTE — ED Notes (Signed)
Attempted to call report to [phone].  Phone rung until a fast busy.

## 2022-04-20 ENCOUNTER — Emergency Department (HOSPITAL_COMMUNITY): Payer: Medicare Other

## 2022-04-20 ENCOUNTER — Encounter (HOSPITAL_COMMUNITY): Payer: Self-pay | Admitting: Pharmacy Technician

## 2022-04-20 ENCOUNTER — Inpatient Hospital Stay (HOSPITAL_COMMUNITY)
Admission: EM | Admit: 2022-04-20 | Discharge: 2022-05-03 | DRG: 605 | Disposition: A | Discharge status: Home / Self Care | Payer: Medicare Other | Attending: General Surgery | Admitting: General Surgery

## 2022-04-20 ENCOUNTER — Other Ambulatory Visit: Payer: Self-pay

## 2022-04-20 DIAGNOSIS — K5900 Constipation, unspecified: Secondary | ICD-10-CM | POA: Diagnosis not present

## 2022-04-20 DIAGNOSIS — Z781 Physical restraint status: Secondary | ICD-10-CM

## 2022-04-20 DIAGNOSIS — F250 Schizoaffective disorder, bipolar type: Secondary | ICD-10-CM | POA: Diagnosis present

## 2022-04-20 DIAGNOSIS — D696 Thrombocytopenia, unspecified: Secondary | ICD-10-CM | POA: Diagnosis not present

## 2022-04-20 DIAGNOSIS — M549 Dorsalgia, unspecified: Secondary | ICD-10-CM | POA: Diagnosis present

## 2022-04-20 DIAGNOSIS — Z91013 Allergy to seafood: Secondary | ICD-10-CM

## 2022-04-20 DIAGNOSIS — B1920 Unspecified viral hepatitis C without hepatic coma: Secondary | ICD-10-CM | POA: Diagnosis present

## 2022-04-20 DIAGNOSIS — Z23 Encounter for immunization: Secondary | ICD-10-CM | POA: Diagnosis present

## 2022-04-20 DIAGNOSIS — K7460 Unspecified cirrhosis of liver: Secondary | ICD-10-CM | POA: Diagnosis present

## 2022-04-20 DIAGNOSIS — I2510 Atherosclerotic heart disease of native coronary artery without angina pectoris: Secondary | ICD-10-CM | POA: Diagnosis present

## 2022-04-20 DIAGNOSIS — E669 Obesity, unspecified: Secondary | ICD-10-CM | POA: Diagnosis present

## 2022-04-20 DIAGNOSIS — F10129 Alcohol abuse with intoxication, unspecified: Secondary | ICD-10-CM | POA: Diagnosis present

## 2022-04-20 DIAGNOSIS — S61151A Open bite of right thumb with damage to nail, initial encounter: Secondary | ICD-10-CM | POA: Diagnosis not present

## 2022-04-20 DIAGNOSIS — F10120 Alcohol abuse with intoxication, uncomplicated: Secondary | ICD-10-CM | POA: Diagnosis present

## 2022-04-20 DIAGNOSIS — T1491XA Suicide attempt, initial encounter: Secondary | ICD-10-CM

## 2022-04-20 DIAGNOSIS — Z5902 Unsheltered homelessness: Secondary | ICD-10-CM | POA: Diagnosis not present

## 2022-04-20 DIAGNOSIS — T148XXA Other injury of unspecified body region, initial encounter: Secondary | ICD-10-CM | POA: Diagnosis not present

## 2022-04-20 DIAGNOSIS — X788XXA Intentional self-harm by other sharp object, initial encounter: Secondary | ICD-10-CM | POA: Diagnosis present

## 2022-04-20 DIAGNOSIS — Z91018 Allergy to other foods: Secondary | ICD-10-CM

## 2022-04-20 DIAGNOSIS — S31115A Laceration without foreign body of abdominal wall, periumbilic region without penetration into peritoneal cavity, initial encounter: Secondary | ICD-10-CM | POA: Diagnosis present

## 2022-04-20 DIAGNOSIS — F1410 Cocaine abuse, uncomplicated: Secondary | ICD-10-CM | POA: Diagnosis present

## 2022-04-20 DIAGNOSIS — S31119A Laceration without foreign body of abdominal wall, unspecified quadrant without penetration into peritoneal cavity, initial encounter: Principal | ICD-10-CM

## 2022-04-20 DIAGNOSIS — F329 Major depressive disorder, single episode, unspecified: Secondary | ICD-10-CM | POA: Diagnosis not present

## 2022-04-20 DIAGNOSIS — Y908 Blood alcohol level of 240 mg/100 ml or more: Secondary | ICD-10-CM | POA: Diagnosis present

## 2022-04-20 DIAGNOSIS — Z6836 Body mass index (BMI) 36.0-36.9, adult: Secondary | ICD-10-CM

## 2022-04-20 DIAGNOSIS — Z9151 Personal history of suicidal behavior: Secondary | ICD-10-CM | POA: Diagnosis not present

## 2022-04-20 HISTORY — DX: Unspecified cirrhosis of liver: K74.60

## 2022-04-20 HISTORY — DX: Depression, unspecified: F32.A

## 2022-04-20 LAB — CBC
HCT: 38 % — ABNORMAL LOW (ref 39.0–52.0)
Hemoglobin: 11.1 g/dL — ABNORMAL LOW (ref 13.0–17.0)
MCH: 22.6 pg — ABNORMAL LOW (ref 26.0–34.0)
MCHC: 29.2 g/dL — ABNORMAL LOW (ref 30.0–36.0)
MCV: 77.4 fL — ABNORMAL LOW (ref 80.0–100.0)
Platelets: 139 10*3/uL — ABNORMAL LOW (ref 150–400)
RBC: 4.91 MIL/uL (ref 4.22–5.81)
RDW: 21 % — ABNORMAL HIGH (ref 11.5–15.5)
WBC: 6.9 10*3/uL (ref 4.0–10.5)
nRBC: 0 % (ref 0.0–0.2)

## 2022-04-20 LAB — I-STAT CHEM 8, ED
BUN: 10 mg/dL (ref 8–23)
Calcium, Ion: 0.88 mmol/L — CL (ref 1.15–1.40)
Chloride: 103 mmol/L (ref 98–111)
Creatinine, Ser: 1.3 mg/dL — ABNORMAL HIGH (ref 0.61–1.24)
Glucose, Bld: 93 mg/dL (ref 70–99)
HCT: 38 % — ABNORMAL LOW (ref 39.0–52.0)
Hemoglobin: 12.9 g/dL — ABNORMAL LOW (ref 13.0–17.0)
Potassium: 4.2 mmol/L (ref 3.5–5.1)
Sodium: 135 mmol/L (ref 135–145)
TCO2: 21 mmol/L — ABNORMAL LOW (ref 22–32)

## 2022-04-20 LAB — COMPREHENSIVE METABOLIC PANEL
ALT: 41 U/L (ref 0–44)
AST: 74 U/L — ABNORMAL HIGH (ref 15–41)
Albumin: 3.4 g/dL — ABNORMAL LOW (ref 3.5–5.0)
Alkaline Phosphatase: 69 U/L (ref 38–126)
Anion gap: 16 — ABNORMAL HIGH (ref 5–15)
BUN: 10 mg/dL (ref 8–23)
CO2: 19 mmol/L — ABNORMAL LOW (ref 22–32)
Calcium: 8.8 mg/dL — ABNORMAL LOW (ref 8.9–10.3)
Chloride: 100 mmol/L (ref 98–111)
Creatinine, Ser: 1.06 mg/dL (ref 0.61–1.24)
GFR, Estimated: >60 mL/min (ref >60)
Glucose, Bld: 90 mg/dL (ref 70–99)
Potassium: 4.1 mmol/L (ref 3.5–5.1)
Sodium: 135 mmol/L (ref 135–145)
Total Bilirubin: 1.4 mg/dL — ABNORMAL HIGH (ref 0.3–1.2)
Total Protein: 7.3 g/dL (ref 6.5–8.1)

## 2022-04-20 LAB — SAMPLE TO BLOOD BANK

## 2022-04-20 LAB — PROTIME-INR
INR: 1.1 (ref 0.8–1.2)
Prothrombin Time: 14.1 seconds (ref 11.4–15.2)

## 2022-04-20 LAB — ETHANOL: Alcohol, Ethyl (B): 262 mg/dL — ABNORMAL HIGH (ref <10)

## 2022-04-20 LAB — LACTIC ACID, PLASMA: Lactic Acid, Venous: 3.2 mmol/L (ref 0.5–1.9)

## 2022-04-20 MED ORDER — ADULT MULTIVITAMIN W/MINERALS CH
1.0000 | ORAL_TABLET | Freq: Every day | ORAL | Status: DC
  Administered 2022-04-20 – 2022-05-03 (×14): 1 via ORAL
  Filled 2022-04-20 (×14): qty 1

## 2022-04-20 MED ORDER — THIAMINE HCL 100 MG/ML IJ SOLN
100.0000 mg | Freq: Every day | INTRAMUSCULAR | Status: DC
Start: 2022-04-20 — End: 2022-05-03
  Filled 2022-04-20 (×2): qty 2

## 2022-04-20 MED ORDER — IOHEXOL 300 MG/ML  SOLN
100.0000 mL | Freq: Once | INTRAMUSCULAR | Status: AC | PRN
  Administered 2022-04-20: 100 mL via INTRAVENOUS

## 2022-04-20 MED ORDER — OXYCODONE HCL 5 MG PO TABS
10.0000 mg | ORAL_TABLET | ORAL | Status: DC | PRN
  Administered 2022-04-22 – 2022-04-30 (×18): 10 mg via ORAL
  Filled 2022-04-20 (×18): qty 2

## 2022-04-20 MED ORDER — LORAZEPAM 1 MG PO TABS
1.0000 mg | ORAL_TABLET | ORAL | Status: AC | PRN
  Administered 2022-04-20 – 2022-04-22 (×2): 1 mg via ORAL
  Filled 2022-04-20: qty 1

## 2022-04-20 MED ORDER — LORAZEPAM 2 MG/ML IJ SOLN: 1.0000 mg | INTRAMUSCULAR | Status: AC | PRN

## 2022-04-20 MED ORDER — ONDANSETRON HCL 4 MG/2ML IJ SOLN
4.0000 mg | Freq: Four times a day (QID) | INTRAMUSCULAR | Status: DC | PRN
  Administered 2022-04-29: 4 mg via INTRAVENOUS
  Filled 2022-04-20: qty 2

## 2022-04-20 MED ORDER — SIMETHICONE 80 MG PO CHEW
80.0000 mg | CHEWABLE_TABLET | Freq: Four times a day (QID) | ORAL | Status: DC | PRN
  Administered 2022-04-23: 80 mg via ORAL
  Filled 2022-04-20: qty 1

## 2022-04-20 MED ORDER — OXYCODONE HCL 5 MG PO TABS
5.0000 mg | ORAL_TABLET | ORAL | Status: DC | PRN
  Administered 2022-04-20 – 2022-05-03 (×5): 5 mg via ORAL
  Filled 2022-04-20 (×5): qty 1

## 2022-04-20 MED ORDER — TETANUS-DIPHTH-ACELL PERTUSSIS 5-2.5-18.5 LF-MCG/0.5 IM SUSY
0.5000 mL | PREFILLED_SYRINGE | Freq: Once | INTRAMUSCULAR | Status: AC
Start: 2022-04-20 — End: 2022-04-20
  Administered 2022-04-20: 0.5 mL via INTRAMUSCULAR

## 2022-04-20 MED ORDER — LORAZEPAM 1 MG PO TABS
0.0000 mg | ORAL_TABLET | ORAL | Status: AC
  Administered 2022-04-21: 1 mg via ORAL
  Administered 2022-04-22: 2 mg via ORAL
  Filled 2022-04-20: qty 1
  Filled 2022-04-20: qty 2

## 2022-04-20 MED ORDER — FOLIC ACID 1 MG PO TABS
1.0000 mg | ORAL_TABLET | Freq: Every day | ORAL | Status: DC
Start: 2022-04-20 — End: 2022-05-03
  Administered 2022-04-20 – 2022-05-03 (×14): 1 mg via ORAL
  Filled 2022-04-20 (×14): qty 1

## 2022-04-20 MED ORDER — METHOCARBAMOL 1000 MG/10ML IJ SOLN: 500.0000 mg | Freq: Four times a day (QID) | INTRAVENOUS | Status: DC | PRN

## 2022-04-20 MED ORDER — GABAPENTIN 600 MG PO TABS
300.0000 mg | ORAL_TABLET | Freq: Three times a day (TID) | ORAL | Status: DC
  Administered 2022-04-20 – 2022-05-03 (×37): 300 mg via ORAL
  Filled 2022-04-20 (×37): qty 1

## 2022-04-20 MED ORDER — LORAZEPAM 1 MG PO TABS
0.0000 mg | ORAL_TABLET | Freq: Three times a day (TID) | ORAL | Status: AC
  Filled 2022-04-20: qty 2

## 2022-04-20 MED ORDER — THIAMINE HCL 100 MG PO TABS
100.0000 mg | ORAL_TABLET | Freq: Every day | ORAL | Status: DC
  Administered 2022-04-20 – 2022-05-03 (×14): 100 mg via ORAL
  Filled 2022-04-20 (×14): qty 1

## 2022-04-20 MED ORDER — DOCUSATE SODIUM 100 MG PO CAPS
100.0000 mg | ORAL_CAPSULE | Freq: Two times a day (BID) | ORAL | Status: DC
  Administered 2022-04-20 – 2022-05-03 (×26): 100 mg via ORAL
  Filled 2022-04-20 (×27): qty 1

## 2022-04-20 MED ORDER — SODIUM CHLORIDE 0.9 % IV BOLUS
1000.0000 mL | Freq: Once | INTRAVENOUS | Status: AC
  Administered 2022-04-20: 1000 mL via INTRAVENOUS

## 2022-04-20 MED ORDER — ENOXAPARIN SODIUM 30 MG/0.3ML IJ SOSY
30.0000 mg | PREFILLED_SYRINGE | Freq: Two times a day (BID) | INTRAMUSCULAR | Status: DC
  Administered 2022-04-21 – 2022-04-25 (×9): 30 mg via SUBCUTANEOUS
  Filled 2022-04-20 (×9): qty 0.3

## 2022-04-20 MED ORDER — PROCHLORPERAZINE EDISYLATE 10 MG/2ML IJ SOLN: 10.0000 mg | INTRAMUSCULAR | Status: DC | PRN

## 2022-04-20 NOTE — TOC CAGE-AID Note (Signed)
Transition of Care [facility]) - CAGE-AID Screening   Patient Details  Name: KEKOA FYOCK MRN: [id number] Date of Birth: 1955/12/18  [facility]) CM/SW Contact:    Katha Hamming, RN Phone Number: 04/20/2022, 10:23 PM   Clinical Narrative:  Reports drinking 10 40oz beers daily and has been using cocaine daily for the last two weeks. Hx alcohol withdrawal, CIWA orders in place. Requesting long term detox facility, requests [facility] - states he's been there before. States he recently was in a 5-7 day detox and they don't work for him.  CAGE-AID Screening:    Have You Ever Felt You Ought to Cut Down on Your Drinking or Drug Use?: Yes Have People Annoyed You By Critizing Your Drinking Or Drug Use?: Yes Have You Felt Bad Or Guilty About Your Drinking Or Drug Use?: Yes Have You Ever Had a Drink or Used Drugs First Thing In The Morning to Steady Your Nerves or to Get Rid of a Hangover?: Yes CAGE-AID Score: 4  Substance Abuse Education Offered: Yes  Substance abuse interventions: Other (must comment) (wants referral to "long term detox")
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
## 2022-04-20 NOTE — Progress Notes (Signed)
RT responded to level 1 trauma page. Pt arrived in no resp distress. No stridor noted, clear bilateral breath sounds. Sat 98% on room air. RT released by Dr. Charm Barges. RT will continue to be available.
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
## 2022-04-20 NOTE — Progress Notes (Signed)
Chaplain responded to Level 1 trauma page.  Pt receiving treatment.  No family present.  Chaplain on standby and available if needed.  Vernell Morgans Chaplain

## 2022-04-20 NOTE — Progress Notes (Signed)
Orthopedic Tech Progress Note Patient Details:  [name] 1956-08-25 [id number]  Patient ID: [name], male   DOB: February 02, 1956, 66 y.o.   MRN: [id number] Melina Fiddler attended trauma page. Trinna Post 04/20/2022, 7:18 PM

## 2022-04-20 NOTE — ED Notes (Signed)
Pt admits to drinking 5 40's of beer today
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
## 2022-04-21 DIAGNOSIS — F250 Schizoaffective disorder, bipolar type: Secondary | ICD-10-CM

## 2022-04-21 DIAGNOSIS — F329 Major depressive disorder, single episode, unspecified: Secondary | ICD-10-CM

## 2022-04-21 DIAGNOSIS — T1491XA Suicide attempt, initial encounter: Secondary | ICD-10-CM | POA: Diagnosis not present

## 2022-04-21 DIAGNOSIS — T148XXA Other injury of unspecified body region, initial encounter: Secondary | ICD-10-CM

## 2022-04-21 LAB — CBC
HCT: 29.3 % — ABNORMAL LOW (ref 39.0–52.0)
Hemoglobin: 9.1 g/dL — ABNORMAL LOW (ref 13.0–17.0)
MCH: 23.1 pg — ABNORMAL LOW (ref 26.0–34.0)
MCHC: 31.1 g/dL (ref 30.0–36.0)
MCV: 74.4 fL — ABNORMAL LOW (ref 80.0–100.0)
Platelets: 99 10*3/uL — ABNORMAL LOW (ref 150–400)
RBC: 3.94 MIL/uL — ABNORMAL LOW (ref 4.22–5.81)
RDW: 20.3 % — ABNORMAL HIGH (ref 11.5–15.5)
WBC: 5.8 10*3/uL (ref 4.0–10.5)
nRBC: 0 % (ref 0.0–0.2)

## 2022-04-21 LAB — BASIC METABOLIC PANEL
Anion gap: 14 (ref 5–15)
BUN: 12 mg/dL (ref 8–23)
CO2: 25 mmol/L (ref 22–32)
Calcium: 9.1 mg/dL (ref 8.9–10.3)
Chloride: 102 mmol/L (ref 98–111)
Creatinine, Ser: 0.97 mg/dL (ref 0.61–1.24)
GFR, Estimated: >60 mL/min (ref >60)
Glucose, Bld: 104 mg/dL — ABNORMAL HIGH (ref 70–99)
Potassium: 4.1 mmol/L (ref 3.5–5.1)
Sodium: 141 mmol/L (ref 135–145)

## 2022-04-21 LAB — HIV ANTIBODY (ROUTINE TESTING W REFLEX): HIV Screen 4th Generation wRfx: NONREACTIVE

## 2022-04-21 MED ORDER — ARIPIPRAZOLE 10 MG PO TABS
5.0000 mg | ORAL_TABLET | Freq: Every day | ORAL | Status: DC
  Administered 2022-04-21 – 2022-04-25 (×5): 5 mg via ORAL
  Filled 2022-04-21 (×5): qty 1

## 2022-04-21 MED ORDER — SODIUM CHLORIDE 0.9 % IV SOLN
25.0000 mg | Freq: Once | INTRAVENOUS | Status: AC
  Administered 2022-04-21: 25 mg via INTRAVENOUS
  Filled 2022-04-21: qty 1

## 2022-04-21 MED ORDER — SODIUM CHLORIDE 0.9 % IV SOLN
25.0000 mg | Freq: Four times a day (QID) | INTRAVENOUS | Status: DC | PRN
  Administered 2022-04-30: 25 mg via INTRAVENOUS
  Filled 2022-04-21 (×2): qty 1

## 2022-04-21 MED ORDER — SPIRITUS FRUMENTI
1.0000 | Freq: Three times a day (TID) | ORAL | Status: DC
  Administered 2022-04-21 – 2022-04-24 (×12): 1 via ORAL
  Filled 2022-04-21 (×15): qty 1
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
## 2022-04-22 DIAGNOSIS — F250 Schizoaffective disorder, bipolar type: Secondary | ICD-10-CM | POA: Diagnosis not present

## 2022-04-22 DIAGNOSIS — T148XXA Other injury of unspecified body region, initial encounter: Secondary | ICD-10-CM | POA: Diagnosis not present

## 2022-04-22 DIAGNOSIS — F329 Major depressive disorder, single episode, unspecified: Secondary | ICD-10-CM | POA: Diagnosis not present

## 2022-04-22 DIAGNOSIS — T1491XA Suicide attempt, initial encounter: Secondary | ICD-10-CM | POA: Diagnosis not present

## 2022-04-22 LAB — CBC
HCT: 26.6 % — ABNORMAL LOW (ref 39.0–52.0)
Hemoglobin: 8.2 g/dL — ABNORMAL LOW (ref 13.0–17.0)
MCH: 23.1 pg — ABNORMAL LOW (ref 26.0–34.0)
MCHC: 30.8 g/dL (ref 30.0–36.0)
MCV: 74.9 fL — ABNORMAL LOW (ref 80.0–100.0)
Platelets: 73 10*3/uL — ABNORMAL LOW (ref 150–400)
RBC: 3.55 MIL/uL — ABNORMAL LOW (ref 4.22–5.81)
RDW: 20.5 % — ABNORMAL HIGH (ref 11.5–15.5)
WBC: 3.6 10*3/uL — ABNORMAL LOW (ref 4.0–10.5)
nRBC: 0 % (ref 0.0–0.2)

## 2022-04-22 LAB — BASIC METABOLIC PANEL
Anion gap: 9 (ref 5–15)
BUN: 9 mg/dL (ref 8–23)
CO2: 26 mmol/L (ref 22–32)
Calcium: 8.6 mg/dL — ABNORMAL LOW (ref 8.9–10.3)
Chloride: 103 mmol/L (ref 98–111)
Creatinine, Ser: 0.9 mg/dL (ref 0.61–1.24)
GFR, Estimated: >60 mL/min (ref >60)
Glucose, Bld: 124 mg/dL — ABNORMAL HIGH (ref 70–99)
Potassium: 3.7 mmol/L (ref 3.5–5.1)
Sodium: 138 mmol/L (ref 135–145)

## 2022-04-22 MED ORDER — LACTULOSE 10 GM/15ML PO SOLN
20.0000 g | Freq: Every day | ORAL | Status: DC | PRN
  Administered 2022-04-24 – 2022-04-26 (×3): 20 g via ORAL
  Filled 2022-04-22 (×3): qty 30

## 2022-04-22 MED ORDER — MELATONIN 3 MG PO TABS
3.0000 mg | ORAL_TABLET | Freq: Every day | ORAL | Status: DC
  Administered 2022-04-22 – 2022-04-25 (×4): 3 mg via ORAL
  Filled 2022-04-22 (×4): qty 1

## 2022-04-22 MED ORDER — METOCLOPRAMIDE HCL 5 MG/ML IJ SOLN
10.0000 mg | Freq: Three times a day (TID) | INTRAMUSCULAR | Status: DC | PRN
  Administered 2022-04-22 – 2022-04-25 (×7): 10 mg via INTRAVENOUS
  Filled 2022-04-22 (×7): qty 2

## 2022-04-22 MED ORDER — LACTULOSE 10 GM/15ML PO SOLN
20.0000 g | Freq: Once | ORAL | Status: AC
Start: 2022-04-22 — End: 2022-04-22
  Administered 2022-04-22: 20 g via ORAL
  Filled 2022-04-22: qty 30

## 2022-04-22 NOTE — TOC Progression Note (Signed)
Transition of Care [facility]) - Progression Note    Patient Details  Name: CHRISTPHOR GROFT MRN: [id number] Date of Birth: Feb 14, 1956  Transition of Care [facility]) CM/SW Contact  Astrid Drafts Berna Spare, RN Phone Number: 04/22/2022, 2:14pm  Clinical Narrative:    [facility] and [facility] continuing to review for potential psych admission; sent messages to intake at [facility] and Dr. Marlou Porch at [facility].  TOC will continue to follow/provide updates as available.   Expected Discharge Plan: Psychiatric Hospital Barriers to Discharge: Continued Medical Work up  Expected Discharge Plan and Services Expected Discharge Plan: Psychiatric Hospital   Discharge Planning Services: CM Consult   Living arrangements for the past 2 months: Homeless                                       Social Determinants of Health (SDOH) Interventions    Readmission Risk Interventions     No data to display         Quintella Baton, RN, BSN  Trauma/Neuro ICU Case Manager [phone]
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
## 2022-04-23 LAB — CBC
HCT: 27 % — ABNORMAL LOW (ref 39.0–52.0)
Hemoglobin: 8.4 g/dL — ABNORMAL LOW (ref 13.0–17.0)
MCH: 23.3 pg — ABNORMAL LOW (ref 26.0–34.0)
MCHC: 31.1 g/dL (ref 30.0–36.0)
MCV: 74.8 fL — ABNORMAL LOW (ref 80.0–100.0)
Platelets: 73 10*3/uL — ABNORMAL LOW (ref 150–400)
RBC: 3.61 MIL/uL — ABNORMAL LOW (ref 4.22–5.81)
RDW: 20.9 % — ABNORMAL HIGH (ref 11.5–15.5)
WBC: 3.7 10*3/uL — ABNORMAL LOW (ref 4.0–10.5)
nRBC: 0 % (ref 0.0–0.2)

## 2022-04-23 MED ORDER — NEPAFENAC 0.1 % OP SUSP
1.0000 [drp] | Freq: Two times a day (BID) | OPHTHALMIC | Status: DC
  Administered 2022-04-23 – 2022-05-03 (×17): 1 [drp] via OPHTHALMIC
  Filled 2022-04-23 (×2): qty 3

## 2022-04-23 MED ORDER — POLYVINYL ALCOHOL 1.4 % OP SOLN
2.0000 [drp] | OPHTHALMIC | Status: DC | PRN
Start: 2022-04-23 — End: 2022-05-03
  Administered 2022-04-23 – 2022-05-03 (×9): 2 [drp] via OPHTHALMIC
  Filled 2022-04-23: qty 15
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
## 2022-04-23 NOTE — Plan of Care (Signed)
  Problem: Health Behavior/Discharge Planning: Goal: Ability to manage health-related needs will improve Outcome: Progressing   

## 2022-04-23 NOTE — Progress Notes (Signed)
Patient was transferred to [facility] , verbal and written report given to receiving nurse. Suicide sitter at bedside

## 2022-04-24 MED ORDER — POLYETHYLENE GLYCOL 3350 17 G PO PACK
17.0000 g | PACK | Freq: Every day | ORAL | Status: DC
  Administered 2022-04-24 – 2022-05-03 (×10): 17 g via ORAL
  Filled 2022-04-24 (×10): qty 1
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
## 2022-04-24 NOTE — Progress Notes (Signed)
Per Dr. Christia Reading, patient meets criteria for inpatient treatment. There are no available beds at [facility] today. CSW faxed referrals to the following facilities for review:  [facility]  Pending - Request Sent N/A [address], [place] [address] [phone] --  [facility]  Pending - Request Sent N/A [address] [place] [address] [phone] --  [facility]  Pending - No Request Sent N/A [address] [place] [address] [phone] --  [facility]  Pending - No Request Sent N/A [address] [place] [address] [phone] --  [facility]  Pending - No Request Sent N/A [address] [place], [place] [address] [phone] --  [facility]  Pending - No Request Sent N/A [address] [place] [address] [phone] --  [facility]  Pending - No Request Sent N/A [address] [place] [address] [phone] --  [facility]  Pending - No Request Sent N/A [address] [place] [address] [phone] --  [facility]  Pending - No Request Sent N/A [address] [place] [address] [phone] --   TTS will continue to seek bed placement.  Crissie Reese, MSW, Lenice Pressman Phone: [phone] Disposition/TOC

## 2022-04-25 DIAGNOSIS — T148XXA Other injury of unspecified body region, initial encounter: Secondary | ICD-10-CM | POA: Diagnosis not present

## 2022-04-25 MED ORDER — CHLORDIAZEPOXIDE HCL 5 MG PO CAPS
5.0000 mg | ORAL_CAPSULE | Freq: Three times a day (TID) | ORAL | Status: DC
  Administered 2022-04-25 – 2022-05-03 (×23): 5 mg via ORAL
  Filled 2022-04-25 (×23): qty 1

## 2022-04-25 MED ORDER — LORAZEPAM 2 MG/ML IJ SOLN: 1.0000 mg | INTRAMUSCULAR | Status: AC | PRN

## 2022-04-25 MED ORDER — LORAZEPAM 1 MG PO TABS: 1.0000 mg | ORAL_TABLET | ORAL | Status: AC | PRN

## 2022-04-25 MED ORDER — LACTULOSE 10 GM/15ML PO SOLN
20.0000 g | ORAL | Status: AC
  Administered 2022-04-25: 20 g via ORAL
  Filled 2022-04-25: qty 30

## 2022-04-25 MED ORDER — ARIPIPRAZOLE 5 MG PO TABS
7.5000 mg | ORAL_TABLET | Freq: Every day | ORAL | Status: DC
  Administered 2022-04-26 – 2022-05-02 (×7): 7.5 mg via ORAL
  Filled 2022-04-25 (×8): qty 2

## 2022-04-25 NOTE — Progress Notes (Signed)
Pt refused Tele-monitor ordered today. When nurse explained why needs it he stated " I dont need that, I need something for my stomach, I need more lactulose". Nurse continued patient about what new medications were prescribed and patient still refused cardiac monitoring. MD notified.

## 2022-04-25 NOTE — Care Management Important Message (Signed)
Important Message  Patient Details  Name: [name] MRN: [id number] Date of Birth: [birth year]   Medicare Important Message Given:  Yes     Dorena Bodo 04/25/2022, 4:09 PM
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
## 2022-04-26 DIAGNOSIS — T148XXA Other injury of unspecified body region, initial encounter: Secondary | ICD-10-CM | POA: Diagnosis not present

## 2022-04-26 LAB — URINALYSIS, ROUTINE W REFLEX MICROSCOPIC
Bilirubin Urine: NEGATIVE
Glucose, UA: NEGATIVE mg/dL
Hgb urine dipstick: NEGATIVE
Ketones, ur: NEGATIVE mg/dL
Leukocytes,Ua: NEGATIVE
Nitrite: NEGATIVE
Protein, ur: NEGATIVE mg/dL
Specific Gravity, Urine: 1.008 (ref 1.005–1.030)
pH: 7 (ref 5.0–8.0)

## 2022-04-26 LAB — BASIC METABOLIC PANEL
Anion gap: 7 (ref 5–15)
BUN: 12 mg/dL (ref 8–23)
CO2: 28 mmol/L (ref 22–32)
Calcium: 8.8 mg/dL — ABNORMAL LOW (ref 8.9–10.3)
Chloride: 105 mmol/L (ref 98–111)
Creatinine, Ser: 1.04 mg/dL (ref 0.61–1.24)
GFR, Estimated: >60 mL/min (ref >60)
Glucose, Bld: 101 mg/dL — ABNORMAL HIGH (ref 70–99)
Potassium: 4.5 mmol/L (ref 3.5–5.1)
Sodium: 140 mmol/L (ref 135–145)

## 2022-04-26 LAB — CBC
HCT: 25.1 % — ABNORMAL LOW (ref 39.0–52.0)
Hemoglobin: 7.7 g/dL — ABNORMAL LOW (ref 13.0–17.0)
MCH: 23.1 pg — ABNORMAL LOW (ref 26.0–34.0)
MCHC: 30.7 g/dL (ref 30.0–36.0)
MCV: 75.1 fL — ABNORMAL LOW (ref 80.0–100.0)
Platelets: 98 10*3/uL — ABNORMAL LOW (ref 150–400)
RBC: 3.34 MIL/uL — ABNORMAL LOW (ref 4.22–5.81)
RDW: 20.3 % — ABNORMAL HIGH (ref 11.5–15.5)
WBC: 3.8 10*3/uL — ABNORMAL LOW (ref 4.0–10.5)
nRBC: 0 % (ref 0.0–0.2)

## 2022-04-26 MED ORDER — LACTULOSE 10 GM/15ML PO SOLN
20.0000 g | Freq: Two times a day (BID) | ORAL | Status: DC | PRN
Start: 2022-04-26 — End: 2022-04-28
  Administered 2022-04-26 – 2022-04-28 (×3): 20 g via ORAL
  Filled 2022-04-26 (×3): qty 30

## 2022-04-26 MED ORDER — MELATONIN 5 MG PO TABS
5.0000 mg | ORAL_TABLET | Freq: Every day | ORAL | Status: DC
  Administered 2022-04-26 – 2022-05-02 (×7): 5 mg via ORAL
  Filled 2022-04-26 (×7): qty 1
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
## 2022-04-26 NOTE — Social Work (Signed)
Per [facility] psychiatry they are knowledgeable about this pt and similar repeat admissions but they have no beds available. [facility] will follow up again tomorrow on availability.
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
## 2022-04-27 LAB — BASIC METABOLIC PANEL
Anion gap: 6 (ref 5–15)
BUN: 9 mg/dL (ref 8–23)
CO2: 25 mmol/L (ref 22–32)
Calcium: 8.7 mg/dL — ABNORMAL LOW (ref 8.9–10.3)
Chloride: 108 mmol/L (ref 98–111)
Creatinine, Ser: 1.16 mg/dL (ref 0.61–1.24)
GFR, Estimated: >60 mL/min (ref >60)
Glucose, Bld: 117 mg/dL — ABNORMAL HIGH (ref 70–99)
Potassium: 4.2 mmol/L (ref 3.5–5.1)
Sodium: 139 mmol/L (ref 135–145)

## 2022-04-27 LAB — CBC
HCT: 27.1 % — ABNORMAL LOW (ref 39.0–52.0)
Hemoglobin: 8.2 g/dL — ABNORMAL LOW (ref 13.0–17.0)
MCH: 22.8 pg — ABNORMAL LOW (ref 26.0–34.0)
MCHC: 30.3 g/dL (ref 30.0–36.0)
MCV: 75.5 fL — ABNORMAL LOW (ref 80.0–100.0)
Platelets: 114 10*3/uL — ABNORMAL LOW (ref 150–400)
RBC: 3.59 MIL/uL — ABNORMAL LOW (ref 4.22–5.81)
RDW: 20.1 % — ABNORMAL HIGH (ref 11.5–15.5)
WBC: 3.8 10*3/uL — ABNORMAL LOW (ref 4.0–10.5)
nRBC: 0 % (ref 0.0–0.2)

## 2022-04-27 NOTE — Progress Notes (Signed)
Patient  is biting on his right thumb finger and requesting for band-aid for his finger.On call informed.

## 2022-04-27 NOTE — TOC Progression Note (Addendum)
Transition of Care [facility]) - Progression Note    Patient Details  Name: [name] MRN: [id number] Date of Birth: [birth year]  Transition of Care [facility]) CM/SW Contact  Glennon Mac, RN Phone Number: 04/27/2022, 2:24 PM  Clinical Narrative:    The following [place] inpatient psychiatric facilities have been contacted, requesting follow up on referral:  [facility]: spoke with [facility], case under review [facility]: no beds upon initial referral; requesting refax/done [facility]:  patient declined for "behaviors"/would not specify [facility]:  Left message on VM for f/u [facility]: no gero psych beds available [facility]:  LVM for Precious Haws in admissions [facility]: Left message on VM [place]: Left message on VM [facility]: Left message for admissions [place]: Left message on VM [facility]:  no gero psych beds available [facility]:  requested referral refaxed/done [facility]: No answer [facility]: declined, "not appropriate", would not specify [facility]:  No bed availability [facility]: no longer has IP psych unit  May need [facility] referral; will reach out to psych.     Expected Discharge Plan: Psychiatric Hospital Barriers to Discharge: Continued Medical Work up  Expected Discharge Plan and Services Expected Discharge Plan: Psychiatric Hospital   Discharge Planning Services: CM Consult   Living arrangements for the past 2 months: Homeless                                       Social Determinants of Health (SDOH) Interventions    Readmission Risk Interventions     No data to display         Quintella Baton, RN, BSN  Trauma/Neuro ICU Case Manager [phone]
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
## 2022-04-28 DIAGNOSIS — T148XXA Other injury of unspecified body region, initial encounter: Secondary | ICD-10-CM | POA: Diagnosis not present

## 2022-04-28 LAB — CBC
HCT: 25.4 % — ABNORMAL LOW (ref 39.0–52.0)
Hemoglobin: 7.7 g/dL — ABNORMAL LOW (ref 13.0–17.0)
MCH: 22.7 pg — ABNORMAL LOW (ref 26.0–34.0)
MCHC: 30.3 g/dL (ref 30.0–36.0)
MCV: 74.9 fL — ABNORMAL LOW (ref 80.0–100.0)
Platelets: 125 10*3/uL — ABNORMAL LOW (ref 150–400)
RBC: 3.39 MIL/uL — ABNORMAL LOW (ref 4.22–5.81)
RDW: 19.9 % — ABNORMAL HIGH (ref 11.5–15.5)
WBC: 4.5 10*3/uL (ref 4.0–10.5)
nRBC: 0 % (ref 0.0–0.2)

## 2022-04-28 MED ORDER — LACTULOSE 10 GM/15ML PO SOLN
40.0000 g | Freq: Every day | ORAL | Status: DC | PRN
  Administered 2022-04-29 – 2022-05-03 (×5): 40 g via ORAL
  Filled 2022-04-28 (×6): qty 60

## 2022-04-28 MED ORDER — LORAZEPAM 2 MG/ML IJ SOLN: 1.0000 mg | INTRAMUSCULAR | Status: AC | PRN

## 2022-04-28 MED ORDER — LORAZEPAM 1 MG PO TABS
1.0000 mg | ORAL_TABLET | ORAL | Status: AC | PRN
  Administered 2022-04-29: 1 mg via ORAL
  Filled 2022-04-28: qty 1

## 2022-04-28 MED ORDER — ENOXAPARIN SODIUM 60 MG/0.6ML IJ SOSY
60.0000 mg | PREFILLED_SYRINGE | INTRAMUSCULAR | Status: DC
  Administered 2022-04-28 – 2022-05-03 (×6): 60 mg via SUBCUTANEOUS
  Filled 2022-04-28 (×6): qty 0.6
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
## 2022-04-28 NOTE — Progress Notes (Signed)
Mobility Specialist Progress Note:   04/28/22 1620  Mobility  Activity Ambulated independently in hallway  Level of Assistance Independent  Assistive Device None  Distance Ambulated (ft) 550 ft  Activity Response Tolerated well  $Mobility charge 1 Mobility   Pt ambulated independently. No complaints throughout. Pt back in room with sitter.   [facility] Secure Chat or Office Phone: [phone]

## 2022-04-29 LAB — CBC
HCT: 25.1 % — ABNORMAL LOW (ref 39.0–52.0)
Hemoglobin: 7.8 g/dL — ABNORMAL LOW (ref 13.0–17.0)
MCH: 23 pg — ABNORMAL LOW (ref 26.0–34.0)
MCHC: 31.1 g/dL (ref 30.0–36.0)
MCV: 74 fL — ABNORMAL LOW (ref 80.0–100.0)
Platelets: 125 10*3/uL — ABNORMAL LOW (ref 150–400)
RBC: 3.39 MIL/uL — ABNORMAL LOW (ref 4.22–5.81)
RDW: 19.9 % — ABNORMAL HIGH (ref 11.5–15.5)
WBC: 4.3 10*3/uL (ref 4.0–10.5)
nRBC: 0 % (ref 0.0–0.2)

## 2022-04-29 NOTE — TOC CM/SW Note (Signed)
Was asked to schedule a hospital follow up appointment / establish PCP. First available at [facility]  is May 27, 2022 at 0930 am . They will try to work him in sooner.   Information placed on AVS
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
## 2022-04-29 NOTE — TOC Progression Note (Addendum)
Transition of Care [facility]) - Progression Note    Patient Details  Name: RENALD HAITHCOCK MRN: [id number] Date of Birth: [birth year]  Transition [facility]) CM/SW Contact  Carley Hammed, [place] Phone Number: 04/29/2022, 1:46 PM  Clinical Narrative:    ACT referral sent, will follow up on process as needed.  CSW was notified by [facility] they are unable to offer a bed, ne reason specified. [facility] declined as pt is "no longer actively suicidal". [facility] states they have no beds and don't tend to do admissions over the weekend. The # to call to check on admission status is [phone]. However, MD's will not review pt's until a bed is available and "then it's whoever they want to admit". Per intake there is no way of knowing before hand if pt will be accepted, but states she does not see why he wouldn't. CSW to provide resources for transitional housing options. He is encouraged to call the numbers provided and request an assessment to see if he would be a candidate for services.  CSW reached out to psych to determine disposition in the light of not having a bed, and pt not meeting criteria for some. Psych to see and will advise. TOC will continue to follow for DC needs.  Expected Discharge Plan: Psychiatric Hospital Barriers to Discharge: Continued Medical Work up  Expected Discharge Plan and Services Expected Discharge Plan: Psychiatric Hospital   Discharge Planning Services: CM Consult   Living arrangements for the past 2 months: Homeless                                       Social Determinants of Health (SDOH) Interventions    Readmission Risk Interventions     No data to display

## 2022-04-29 NOTE — Progress Notes (Signed)
Pt is very anxious and under the impression that we are trying to "kick him out" today. I have reach out to social worker- Randa Evens and Dr. Blanche East about this matter and they both assure me that this is not true.   Will continue to monitor pt.
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
## 2022-04-30 DIAGNOSIS — T148XXA Other injury of unspecified body region, initial encounter: Secondary | ICD-10-CM | POA: Diagnosis not present
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
## 2022-04-30 NOTE — Progress Notes (Signed)
Mobility Specialist Progress Note:   04/30/22 1000  Mobility  Activity Ambulated independently in hallway  Level of Assistance Independent  Assistive Device None  Distance Ambulated (ft) 550 ft  Activity Response Tolerated well  $Mobility charge 1 Mobility   Pt received ambulating in hallway independently. No complaints. Back in room with sitter.   [facility] Secure Chat or Office Phone: [phone]

## 2022-05-01 LAB — BASIC METABOLIC PANEL
Anion gap: 5 (ref 5–15)
BUN: 14 mg/dL (ref 8–23)
CO2: 23 mmol/L (ref 22–32)
Calcium: 8.4 mg/dL — ABNORMAL LOW (ref 8.9–10.3)
Chloride: 109 mmol/L (ref 98–111)
Creatinine, Ser: 0.83 mg/dL (ref 0.61–1.24)
GFR, Estimated: >60 mL/min (ref >60)
Glucose, Bld: 98 mg/dL (ref 70–99)
Potassium: 4 mmol/L (ref 3.5–5.1)
Sodium: 137 mmol/L (ref 135–145)

## 2022-05-01 LAB — CBC
HCT: 23 % — ABNORMAL LOW (ref 39.0–52.0)
Hemoglobin: 7.1 g/dL — ABNORMAL LOW (ref 13.0–17.0)
MCH: 22.7 pg — ABNORMAL LOW (ref 26.0–34.0)
MCHC: 30.9 g/dL (ref 30.0–36.0)
MCV: 73.5 fL — ABNORMAL LOW (ref 80.0–100.0)
Platelets: 149 10*3/uL — ABNORMAL LOW (ref 150–400)
RBC: 3.13 MIL/uL — ABNORMAL LOW (ref 4.22–5.81)
RDW: 19.8 % — ABNORMAL HIGH (ref 11.5–15.5)
WBC: 3.6 10*3/uL — ABNORMAL LOW (ref 4.0–10.5)
nRBC: 0 % (ref 0.0–0.2)

## 2022-05-01 NOTE — Progress Notes (Signed)
Notified by telemetry @ 2305 pt had 13 beat atrial tachycardia. Pt in bed resting, no acute distress. Pt denies being symptomatic. No further events during the night.

## 2022-05-01 NOTE — Plan of Care (Signed)
  Problem: Education: Goal: Knowledge of General Education information will improve Description: Including pain rating scale, medication(s)/side effects and non-pharmacologic comfort measures Outcome: Progressing   Problem: Activity: Goal: Risk for activity intolerance will decrease Outcome: Progressing   Problem: Nutrition: Goal: Adequate nutrition will be maintained Outcome: Progressing   Problem: Elimination: Goal: Will not experience complications related to bowel motility Outcome: Progressing   Problem: Pain Managment: Goal: General experience of comfort will improve Outcome: Progressing   Problem: Safety: Goal: Ability to remain free from injury will improve Outcome: Progressing   Problem: Skin Integrity: Goal: Risk for impaired skin integrity will decrease Outcome: Progressing   
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
## 2022-05-02 LAB — CBC
HCT: 23.6 % — ABNORMAL LOW (ref 39.0–52.0)
Hemoglobin: 7.2 g/dL — ABNORMAL LOW (ref 13.0–17.0)
MCH: 22.3 pg — ABNORMAL LOW (ref 26.0–34.0)
MCHC: 30.5 g/dL (ref 30.0–36.0)
MCV: 73.1 fL — ABNORMAL LOW (ref 80.0–100.0)
Platelets: 153 10*3/uL (ref 150–400)
RBC: 3.23 MIL/uL — ABNORMAL LOW (ref 4.22–5.81)
RDW: 19.5 % — ABNORMAL HIGH (ref 11.5–15.5)
WBC: 7.8 10*3/uL (ref 4.0–10.5)
nRBC: 0 % (ref 0.0–0.2)

## 2022-05-02 MED ORDER — HALOPERIDOL LACTATE 5 MG/ML IJ SOLN
10.0000 mg | Freq: Four times a day (QID) | INTRAMUSCULAR | Status: DC | PRN
  Administered 2022-05-02: 10 mg via INTRAVENOUS
  Filled 2022-05-02: qty 2

## 2022-05-02 MED ORDER — HALOPERIDOL LACTATE 5 MG/ML IJ SOLN: 10.0000 mg | Freq: Four times a day (QID) | INTRAMUSCULAR | Status: DC | PRN

## 2022-05-02 NOTE — Progress Notes (Signed)
Patient remains up-set, agitated, angry, and has repeatedly threatened to leave facility due to phone being removed from room.  Spoke with patient in calm manner and explained what his options are with success evidenced by patient agreeing to allow this RN to administer ordered Haldol injection via IV. Will monitor for results and inform next shift nurse of same.

## 2022-05-02 NOTE — Progress Notes (Signed)
RN was called into the room because the patient was going off and threatening to leave due to the sitter taking his room phone out of the room that he is not supposed to have. RN called and notified Trauma MD and she put in an order for Haldol.
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
## 2022-05-03 ENCOUNTER — Other Ambulatory Visit (HOSPITAL_COMMUNITY): Payer: Self-pay

## 2022-05-03 MED ORDER — OXYCODONE HCL 5 MG PO TABS
5.0000 mg | ORAL_TABLET | Freq: Two times a day (BID) | ORAL | 0 refills | Status: DC | PRN
Start: 2022-05-03 — End: 2022-05-06
  Filled 2022-05-03: qty 6, 3d supply, fill #0

## 2022-05-03 MED ORDER — NEPAFENAC 0.1 % OP SUSP
1.0000 [drp] | Freq: Two times a day (BID) | OPHTHALMIC | 0 refills | Status: DC
  Filled 2022-05-03: qty 3, 30d supply, fill #0

## 2022-05-03 MED ORDER — MELATONIN 5 MG PO TABS
5.0000 mg | ORAL_TABLET | Freq: Every day | ORAL | 0 refills | Status: DC
  Filled 2022-05-03: qty 30, 30d supply, fill #0

## 2022-05-03 MED ORDER — LACTULOSE 10 GM/15ML PO SOLN
40.0000 g | Freq: Every day | ORAL | 0 refills | Status: DC | PRN
  Filled 2022-05-03: qty 236, 3d supply, fill #0

## 2022-05-03 MED ORDER — POLYVINYL ALCOHOL 1.4 % OP SOLN
2.0000 [drp] | OPHTHALMIC | 0 refills | Status: DC | PRN
  Filled 2022-05-03: qty 15, fill #0

## 2022-05-03 MED ORDER — ARIPIPRAZOLE 15 MG PO TABS
7.5000 mg | ORAL_TABLET | Freq: Every day | ORAL | 0 refills | Status: DC
  Filled 2022-05-03: qty 30, 60d supply, fill #0

## 2022-05-03 MED ORDER — GABAPENTIN 600 MG PO TABS
300.0000 mg | ORAL_TABLET | Freq: Three times a day (TID) | ORAL | 0 refills | Status: DC
  Filled 2022-05-03: qty 45, 30d supply, fill #0

## 2022-05-03 NOTE — Progress Notes (Signed)
Mobility Specialist Progress Note:   05/03/22 1115  Mobility  Activity Ambulated independently in hallway  Level of Assistance Independent  Assistive Device None  Distance Ambulated (ft) 1650 ft  Activity Response Tolerated well  $Mobility charge 1 Mobility   Pt eager for mobility session. Ambulated independently, asx throughout. Back in room with sitter present.  [facility] Secure Chat or Office Phone: [phone]
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
## 2022-05-03 NOTE — TOC Progression Note (Addendum)
Transition of Care [facility]) - Progression Note    Patient Details  Name: [name] MRN: [id number] Date of Birth: 23-May-1956  Transition of Care [facility] CM/SW Contact  Glennon Mac, RN Phone Number: 05/03/2022, 2:06 PM  Clinical Narrative:    Patient has been cleared by psychiatry for inpatient psych admission.    Referral has been made to ACT team, and patient is appreciative of referral; ACT team contact information has been placed on patient discharge instructions.  Patient states he would go to the shelter if there is a bed available at [facility]; he states he has been speaking with the case manager they are.  I spoke with Lorin Picket at [facility], and he states patient left the shelter on 12/13/2021; facility requires that if patient exits the shelter, that he cannot come back for 6 months.  Lorin Picket, Case manager has placed [name] back on the waiting list for a shelter bed.  [employer] given patient's phone number of [phone].   Updated [name] on discharge arrangements, and provided pt with two bus passes.  DC meds to be filled in [facility] and delivered to room prior to dc. Pt appreciates assistance, and states his is ready for dc.    Expected Discharge Plan:  Home/Self Care Barriers to Discharge:  Barriers Resolved.   Expected Discharge Plan and Services Discharge Planning Services: CM Consult   Living arrangements for the past 2 months: Homeless Expected Discharge Date: 05/03/22                                     Social Determinants of Health (SDOH) Interventions    Readmission Risk Interventions     No data to display         Quintella Baton, RN, BSN  Trauma/Neuro ICU Case Manager [phone]

## 2022-05-03 NOTE — Plan of Care (Signed)
  Problem: Health Behavior/Discharge Planning: Goal: Ability to manage health-related needs will improve Outcome: Completed/Met   Problem: Education: Goal: Knowledge of General Education information will improve Description: Including pain rating scale, medication(s)/side effects and non-pharmacologic comfort measures Outcome: Completed/Met   Problem: Health Behavior/Discharge Planning: Goal: Ability to manage health-related needs will improve Outcome: Completed/Met   Problem: Clinical Measurements: Goal: Ability to maintain clinical measurements within normal limits will improve Outcome: Completed/Met   Problem: Activity: Goal: Risk for activity intolerance will decrease Outcome: Completed/Met   Problem: Nutrition: Goal: Adequate nutrition will be maintained Outcome: Completed/Met   Problem: Elimination: Goal: Will not experience complications related to bowel motility Outcome: Completed/Met   Problem: Pain Managment: Goal: General experience of comfort will improve Outcome: Completed/Met   Problem: Safety: Goal: Ability to remain free from injury will improve Outcome: Completed/Met   Problem: Skin Integrity: Goal: Risk for impaired skin integrity will decrease Outcome: Completed/Met

## 2022-05-03 NOTE — Progress Notes (Addendum)
1:15pm: CSW spoke with Marva at [facility] who confirms receipt of referral. The agency will follow up with patient.  10:25am: CSW spoke with at [facility] at [facility] who states the agency did not receive a referral for this patient. CSW sent referral to [facility] at [facility] for review.  Edwin Dada, MSW, LCSW Transitions of Care  Clinical Social Worker II [phone]

## 2022-05-03 NOTE — Plan of Care (Signed)
  Problem: Health Behavior/Discharge Planning: Goal: Ability to manage health-related needs will improve Outcome: Completed/Met   Problem: Education: Goal: Knowledge of General Education information will improve Description: Including pain rating scale, medication(s)/side effects and non-pharmacologic comfort measures Outcome: Completed/Met   Problem: Health Behavior/Discharge Planning: Goal: Ability to manage health-related needs will improve Outcome: Completed/Met   Problem: Clinical Measurements: Goal: Ability to maintain clinical measurements within normal limits will improve Outcome: Completed/Met   Problem: Activity: Goal: Risk for activity intolerance will decrease Outcome: Completed/Met   Problem: Nutrition: Goal: Adequate nutrition will be maintained Outcome: Completed/Met   Problem: Elimination: Goal: Will not experience complications related to bowel motility Outcome: Completed/Met   Problem: Pain Managment: Goal: General experience of comfort will improve Outcome: Completed/Met   Problem: Safety: Goal: Ability to remain free from injury will improve Outcome: Completed/Met   Problem: Skin Integrity: Goal: Risk for impaired skin integrity will decrease Outcome: Completed/Met   
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
## 2022-05-04 ENCOUNTER — Encounter (HOSPITAL_COMMUNITY): Payer: Self-pay | Admitting: *Deleted

## 2022-05-05 ENCOUNTER — Encounter (HOSPITAL_COMMUNITY): Payer: Self-pay | Admitting: *Deleted

## 2022-05-05 ENCOUNTER — Emergency Department (HOSPITAL_COMMUNITY)
Admission: EM | Admit: 2022-05-05 | Discharge: 2022-05-06 | Disposition: A | Discharge status: Home / Self Care | Payer: Medicare Other | Source: Home / Self Care | Attending: Emergency Medicine | Admitting: Emergency Medicine

## 2022-05-05 ENCOUNTER — Emergency Department (HOSPITAL_COMMUNITY): Payer: Medicare Other

## 2022-05-05 ENCOUNTER — Other Ambulatory Visit: Payer: Self-pay

## 2022-05-05 ENCOUNTER — Emergency Department (HOSPITAL_COMMUNITY)
Admission: EM | Admit: 2022-05-05 | Discharge: 2022-05-05 | Discharge status: Against Medical Advice | Payer: Medicare Other | Attending: Emergency Medicine | Admitting: Emergency Medicine

## 2022-05-05 DIAGNOSIS — R45851 Suicidal ideations: Secondary | ICD-10-CM | POA: Insufficient documentation

## 2022-05-05 DIAGNOSIS — Z20822 Contact with and (suspected) exposure to covid-19: Secondary | ICD-10-CM | POA: Insufficient documentation

## 2022-05-05 DIAGNOSIS — I10 Essential (primary) hypertension: Secondary | ICD-10-CM | POA: Insufficient documentation

## 2022-05-05 DIAGNOSIS — Z5321 Procedure and treatment not carried out due to patient leaving prior to being seen by health care provider: Secondary | ICD-10-CM | POA: Diagnosis not present

## 2022-05-05 DIAGNOSIS — F1090 Alcohol use, unspecified, uncomplicated: Secondary | ICD-10-CM | POA: Insufficient documentation

## 2022-05-05 DIAGNOSIS — Z79899 Other long term (current) drug therapy: Secondary | ICD-10-CM | POA: Insufficient documentation

## 2022-05-05 DIAGNOSIS — T1491XA Suicide attempt, initial encounter: Secondary | ICD-10-CM

## 2022-05-05 DIAGNOSIS — Y906 Blood alcohol level of 120-199 mg/100 ml: Secondary | ICD-10-CM | POA: Insufficient documentation

## 2022-05-05 DIAGNOSIS — R079 Chest pain, unspecified: Secondary | ICD-10-CM | POA: Insufficient documentation

## 2022-05-05 DIAGNOSIS — F332 Major depressive disorder, recurrent severe without psychotic features: Secondary | ICD-10-CM | POA: Insufficient documentation

## 2022-05-05 LAB — BASIC METABOLIC PANEL
Anion gap: 14 (ref 5–15)
BUN: 11 mg/dL (ref 8–23)
CO2: 19 mmol/L — ABNORMAL LOW (ref 22–32)
Calcium: 8.9 mg/dL (ref 8.9–10.3)
Chloride: 105 mmol/L (ref 98–111)
Creatinine, Ser: 0.9 mg/dL (ref 0.61–1.24)
GFR, Estimated: >60 mL/min (ref >60)
Glucose, Bld: 91 mg/dL (ref 70–99)
Potassium: 3.7 mmol/L (ref 3.5–5.1)
Sodium: 138 mmol/L (ref 135–145)

## 2022-05-05 LAB — COMPREHENSIVE METABOLIC PANEL
ALT: 23 U/L (ref 0–44)
AST: 33 U/L (ref 15–41)
Albumin: 3.3 g/dL — ABNORMAL LOW (ref 3.5–5.0)
Alkaline Phosphatase: 60 U/L (ref 38–126)
Anion gap: 12 (ref 5–15)
BUN: 9 mg/dL (ref 8–23)
CO2: 17 mmol/L — ABNORMAL LOW (ref 22–32)
Calcium: 8.6 mg/dL — ABNORMAL LOW (ref 8.9–10.3)
Chloride: 109 mmol/L (ref 98–111)
Creatinine, Ser: 0.87 mg/dL (ref 0.61–1.24)
GFR, Estimated: >60 mL/min (ref >60)
Glucose, Bld: 91 mg/dL (ref 70–99)
Potassium: 3.8 mmol/L (ref 3.5–5.1)
Sodium: 138 mmol/L (ref 135–145)
Total Bilirubin: 0.9 mg/dL (ref 0.3–1.2)
Total Protein: 7.9 g/dL (ref 6.5–8.1)

## 2022-05-05 LAB — CBC
HCT: 27.4 % — ABNORMAL LOW (ref 39.0–52.0)
Hemoglobin: 8.4 g/dL — ABNORMAL LOW (ref 13.0–17.0)
MCH: 22.2 pg — ABNORMAL LOW (ref 26.0–34.0)
MCHC: 30.7 g/dL (ref 30.0–36.0)
MCV: 72.5 fL — ABNORMAL LOW (ref 80.0–100.0)
Platelets: 222 10*3/uL (ref 150–400)
RBC: 3.78 MIL/uL — ABNORMAL LOW (ref 4.22–5.81)
RDW: 19.8 % — ABNORMAL HIGH (ref 11.5–15.5)
WBC: 8 10*3/uL (ref 4.0–10.5)
nRBC: 0 % (ref 0.0–0.2)

## 2022-05-05 LAB — CBC WITH DIFFERENTIAL/PLATELET
Abs Immature Granulocytes: 0.02 10*3/uL (ref 0.00–0.07)
Basophils Absolute: 0.1 10*3/uL (ref 0.0–0.1)
Basophils Relative: 1 %
Eosinophils Absolute: 0.3 10*3/uL (ref 0.0–0.5)
Eosinophils Relative: 4 %
HCT: 27.1 % — ABNORMAL LOW (ref 39.0–52.0)
Hemoglobin: 8.2 g/dL — ABNORMAL LOW (ref 13.0–17.0)
Immature Granulocytes: 0 %
Lymphocytes Relative: 38 %
Lymphs Abs: 2.8 10*3/uL (ref 0.7–4.0)
MCH: 22.3 pg — ABNORMAL LOW (ref 26.0–34.0)
MCHC: 30.3 g/dL (ref 30.0–36.0)
MCV: 73.6 fL — ABNORMAL LOW (ref 80.0–100.0)
Monocytes Absolute: 0.9 10*3/uL (ref 0.1–1.0)
Monocytes Relative: 13 %
Neutro Abs: 3.2 10*3/uL (ref 1.7–7.7)
Neutrophils Relative %: 44 %
Platelets: 234 10*3/uL (ref 150–400)
RBC: 3.68 MIL/uL — ABNORMAL LOW (ref 4.22–5.81)
RDW: 19.6 % — ABNORMAL HIGH (ref 11.5–15.5)
WBC: 7.3 10*3/uL (ref 4.0–10.5)
nRBC: 0 % (ref 0.0–0.2)

## 2022-05-05 LAB — SALICYLATE LEVEL: Salicylate Lvl: <7 mg/dL — ABNORMAL LOW (ref 7.0–30.0)

## 2022-05-05 LAB — ETHANOL: Alcohol, Ethyl (B): 169 mg/dL — ABNORMAL HIGH (ref <10)

## 2022-05-05 LAB — TROPONIN I (HIGH SENSITIVITY)
Troponin I (High Sensitivity): 10 ng/L (ref <18)
Troponin I (High Sensitivity): 7 ng/L (ref <18)

## 2022-05-05 LAB — ACETAMINOPHEN LEVEL: Acetaminophen (Tylenol), Serum: <10 ug/mL — ABNORMAL LOW (ref 10–30)

## 2022-05-05 MED ORDER — ACETAMINOPHEN 325 MG PO TABS: 650.0000 mg | ORAL_TABLET | Freq: Four times a day (QID) | ORAL | Status: DC | PRN

## 2022-05-05 MED ORDER — FOLIC ACID 1 MG PO TABS
1.0000 mg | ORAL_TABLET | Freq: Every day | ORAL | Status: DC
  Administered 2022-05-06: 1 mg via ORAL
  Filled 2022-05-05: qty 1

## 2022-05-05 MED ORDER — LEVOTHYROXINE SODIUM 25 MCG PO TABS
50.0000 ug | ORAL_TABLET | Freq: Every day | ORAL | Status: DC
  Administered 2022-05-06: 50 ug via ORAL
  Filled 2022-05-05: qty 2

## 2022-05-05 MED ORDER — MELATONIN 5 MG PO TABS
5.0000 mg | ORAL_TABLET | Freq: Every day | ORAL | Status: DC
  Administered 2022-05-06: 5 mg via ORAL
  Filled 2022-05-05 (×2): qty 1

## 2022-05-05 MED ORDER — PANTOPRAZOLE SODIUM 40 MG PO TBEC
40.0000 mg | DELAYED_RELEASE_TABLET | Freq: Every day | ORAL | Status: DC
  Administered 2022-05-06: 40 mg via ORAL
  Filled 2022-05-05: qty 1

## 2022-05-05 MED ORDER — AMLODIPINE BESYLATE 5 MG PO TABS
5.0000 mg | ORAL_TABLET | Freq: Every day | ORAL | Status: DC
  Administered 2022-05-06: 5 mg via ORAL
  Filled 2022-05-05: qty 1

## 2022-05-05 MED ORDER — THIAMINE HCL 100 MG PO TABS
100.0000 mg | ORAL_TABLET | Freq: Every day | ORAL | Status: DC
  Administered 2022-05-06: 100 mg via ORAL
  Filled 2022-05-05: qty 1

## 2022-05-05 MED ORDER — FERROUS SULFATE 325 (65 FE) MG PO TABS
325.0000 mg | ORAL_TABLET | Freq: Every day | ORAL | Status: DC
  Administered 2022-05-06: 325 mg via ORAL
  Filled 2022-05-05: qty 1

## 2022-05-05 MED ORDER — OXYCODONE HCL 5 MG PO TABS: 5.0000 mg | ORAL_TABLET | Freq: Two times a day (BID) | ORAL | Status: DC | PRN

## 2022-05-05 MED ORDER — LACTULOSE 10 GM/15ML PO SOLN
20.0000 g | Freq: Two times a day (BID) | ORAL | Status: DC
  Filled 2022-05-05 (×3): qty 30

## 2022-05-05 MED ORDER — GABAPENTIN 300 MG PO CAPS
300.0000 mg | ORAL_CAPSULE | Freq: Three times a day (TID) | ORAL | Status: DC
  Administered 2022-05-06 (×2): 300 mg via ORAL
  Filled 2022-05-05 (×2): qty 1

## 2022-05-05 MED ORDER — ARIPIPRAZOLE 5 MG PO TABS
15.0000 mg | ORAL_TABLET | Freq: Every day | ORAL | Status: DC
Start: 2022-05-05 — End: 2022-05-06
  Administered 2022-05-06: 15 mg via ORAL
  Filled 2022-05-05 (×2): qty 1
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
## 2022-05-05 NOTE — ED Notes (Signed)
Pt not answering when sort calls for vitals and do not see pt

## 2022-05-05 NOTE — ED Notes (Signed)
3 copies of IVC paperwork on purple folder. Original in red folder, 1 copy in medical records pt ivc paperwork expires on 05/12/22

## 2022-05-05 NOTE — ED Provider Triage Note (Signed)
  Emergency Medicine Provider Triage Evaluation Note  MRN:  [id number]  Arrival date & time: 05/05/22    Medically screening exam initiated at 6:26 AM.   CC:   No chief complaint on file.   HPI:  EON ZUNKER is a 66 y.o. year-old male presents to the ED with chief complaint of chest pains for the past month or so.  States that he passed out yesterday.  Reports associated dyspnea on exertion.  Denies any treatments PTA.  History provided by patient. ROS:  -As included in HPI PE:   Vitals:   05/05/22 0626  BP: (!) 174/88  Pulse: 80  Resp: 17  Temp: 98.2 F (36.8 C)  SpO2: 97%    Non-toxic appearing No respiratory distress Slightly pale MDM:  Based on signs and symptoms, ACS is highest on my differential, followed by anemia. I've ordered labs and imaging in triage to expedite lab/diagnostic workup.  Patient was informed that the remainder of the evaluation will be completed by another provider, this initial triage assessment does not replace that evaluation, and the importance of remaining in the ED until their evaluation is complete.    Roxy Horseman, PA-C 05/05/22 [phone]

## 2022-05-06 ENCOUNTER — Ambulatory Visit (HOSPITAL_COMMUNITY)
Admission: EM | Admit: 2022-05-06 | Discharge: 2022-05-06 | Disposition: A | Discharge status: Home / Self Care | Payer: Medicare Other | Attending: Student | Admitting: Student

## 2022-05-06 DIAGNOSIS — R079 Chest pain, unspecified: Secondary | ICD-10-CM | POA: Diagnosis not present

## 2022-05-06 DIAGNOSIS — F1090 Alcohol use, unspecified, uncomplicated: Secondary | ICD-10-CM

## 2022-05-06 DIAGNOSIS — F1094 Alcohol use, unspecified with alcohol-induced mood disorder: Secondary | ICD-10-CM

## 2022-05-06 DIAGNOSIS — Z59819 Housing instability, housed unspecified: Secondary | ICD-10-CM

## 2022-05-06 LAB — RESP PANEL BY RT-PCR (FLU A&B, COVID) ARPGX2
Influenza A by PCR: NEGATIVE
Influenza B by PCR: NEGATIVE
SARS Coronavirus 2 by RT PCR: NEGATIVE

## 2022-05-06 LAB — RAPID URINE DRUG SCREEN, HOSP PERFORMED
Amphetamines: NOT DETECTED
Barbiturates: NOT DETECTED
Benzodiazepines: POSITIVE — AB
Cocaine: NOT DETECTED
Opiates: NOT DETECTED
Tetrahydrocannabinol: NOT DETECTED

## 2022-05-06 MED ORDER — ARIPIPRAZOLE 15 MG PO TABS: 7.5000 mg | ORAL_TABLET | Freq: Every day | ORAL | Status: DC

## 2022-05-06 MED ORDER — ONDANSETRON HCL 4 MG PO TABS: 4.0000 mg | ORAL_TABLET | Freq: Three times a day (TID) | ORAL | Status: DC | PRN

## 2022-05-06 MED ORDER — ZOLPIDEM TARTRATE 5 MG PO TABS: 5.0000 mg | ORAL_TABLET | Freq: Every evening | ORAL | Status: DC | PRN

## 2022-05-06 MED ORDER — LACTULOSE ENCEPHALOPATHY 10 GM/15ML PO SOLN: 20.0000 g | Freq: Every day | ORAL | Status: DC | PRN

## 2022-05-06 MED ORDER — HALOPERIDOL 5 MG PO TABS: 5.0000 mg | ORAL_TABLET | Freq: Four times a day (QID) | ORAL | Status: DC | PRN

## 2022-05-06 MED ORDER — BENZTROPINE MESYLATE 1 MG PO TABS
1.0000 mg | ORAL_TABLET | Freq: Four times a day (QID) | ORAL | Status: DC | PRN
  Administered 2022-05-06: 1 mg via ORAL
  Filled 2022-05-06: qty 1
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
## 2022-05-06 NOTE — ED Notes (Signed)
Book bag and belonging bag placed in locker #5. IVC papers remain in blue zone with NS Annice Pih.

## 2022-05-06 NOTE — ED Notes (Signed)
Changed into scrubs. Calm and cooperative. Wanded by security. Belongings gathered. Snack bag given.

## 2022-05-06 NOTE — Progress Notes (Signed)
[facility] by Park Pope, MD with consideration for [facility] tomorrow.   Nursing notified: Lauree Chandler, NP, Juluis Rainier, RN, Augusto Gamble, MD, Park Pope, MD, Rex Kras, RN, Princess Bruins, DO, Ophelia Shoulder, NP, and Percell Boston, RN.   Maryjean Ka, MSW, [facility] 05/06/2022 12:29 PM

## 2022-05-06 NOTE — ED Notes (Signed)
Patient A&O x 4, ambulatory. Patient discharged in no acute distress. Patient denied SI/HI, A/VH upon discharge. Patient verbalized understanding of all discharge instructions explained by staff, to include follow up appointments and safety plan.  Pt belongings returned to patient from locker intact. Pt asked if he could take a shower before he left facility. Hygiene supplies provided. Patient escorted to lobby via staff with bus pass in hand. Safety maintained.
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
## 2022-05-06 NOTE — ED Notes (Signed)
TTS at bedside. IVC papers faxed to [facility].

## 2022-05-06 NOTE — ED Notes (Signed)
Per security, pt states, "I better be admitted here or I'm gonna show out. I'm looking for long term". MD made aware via secure chat.

## 2022-05-06 NOTE — Discharge Instructions (Addendum)
Dear Mr. Milstein,  Thank you for speaking with me. You will be discharged with plan to see your ACT team on Monday. Please minimize your alcohol use. Please take your medications as prescribed. Please follow up with Dr. Yetta Barre in August. If you have worsening symptoms of depression, alcohol withdrawal, liver cirrhosis (stomach swelling, constant nausea/vomiting, feeling confused) or feel you are unable to keep yourself safe, please call 911, go to the ED or return to [facility]   Take care, -Dr. Hazle Quant

## 2022-05-10 ENCOUNTER — Emergency Department (HOSPITAL_COMMUNITY)
Admission: EM | Admit: 2022-05-10 | Discharge: 2022-05-11 | Disposition: A | Discharge status: Other Acute Inpatient Hospital | Payer: Medicare Other | Attending: Emergency Medicine | Admitting: Emergency Medicine

## 2022-05-10 ENCOUNTER — Encounter (HOSPITAL_COMMUNITY): Payer: Self-pay

## 2022-05-10 ENCOUNTER — Other Ambulatory Visit: Payer: Self-pay

## 2022-05-10 DIAGNOSIS — F1010 Alcohol abuse, uncomplicated: Secondary | ICD-10-CM

## 2022-05-10 DIAGNOSIS — F32A Depression, unspecified: Secondary | ICD-10-CM | POA: Diagnosis present

## 2022-05-10 DIAGNOSIS — Y906 Blood alcohol level of 120-199 mg/100 ml: Secondary | ICD-10-CM | POA: Diagnosis not present

## 2022-05-10 DIAGNOSIS — Z20822 Contact with and (suspected) exposure to covid-19: Secondary | ICD-10-CM | POA: Diagnosis not present

## 2022-05-10 DIAGNOSIS — I10 Essential (primary) hypertension: Secondary | ICD-10-CM | POA: Insufficient documentation

## 2022-05-10 DIAGNOSIS — X789XXA Intentional self-harm by unspecified sharp object, initial encounter: Secondary | ICD-10-CM | POA: Insufficient documentation

## 2022-05-10 DIAGNOSIS — R45851 Suicidal ideations: Secondary | ICD-10-CM | POA: Diagnosis not present

## 2022-05-10 DIAGNOSIS — S51812A Laceration without foreign body of left forearm, initial encounter: Secondary | ICD-10-CM

## 2022-05-10 DIAGNOSIS — F332 Major depressive disorder, recurrent severe without psychotic features: Secondary | ICD-10-CM

## 2022-05-10 DIAGNOSIS — Z5900 Homelessness unspecified: Secondary | ICD-10-CM | POA: Insufficient documentation

## 2022-05-10 DIAGNOSIS — F1020 Alcohol dependence, uncomplicated: Secondary | ICD-10-CM | POA: Diagnosis present

## 2022-05-10 DIAGNOSIS — T1491XA Suicide attempt, initial encounter: Secondary | ICD-10-CM | POA: Diagnosis not present

## 2022-05-10 DIAGNOSIS — S59912A Unspecified injury of left forearm, initial encounter: Secondary | ICD-10-CM | POA: Diagnosis present

## 2022-05-10 LAB — URINALYSIS, ROUTINE W REFLEX MICROSCOPIC
Bilirubin Urine: NEGATIVE
Glucose, UA: NEGATIVE mg/dL
Hgb urine dipstick: NEGATIVE
Ketones, ur: NEGATIVE mg/dL
Leukocytes,Ua: NEGATIVE
Nitrite: NEGATIVE
Protein, ur: NEGATIVE mg/dL
Specific Gravity, Urine: 1.005 (ref 1.005–1.030)
pH: 5 (ref 5.0–8.0)

## 2022-05-10 LAB — CBC WITH DIFFERENTIAL/PLATELET
Abs Immature Granulocytes: 0.01 10*3/uL (ref 0.00–0.07)
Basophils Absolute: 0.1 10*3/uL (ref 0.0–0.1)
Basophils Relative: 1 %
Eosinophils Absolute: 0.1 10*3/uL (ref 0.0–0.5)
Eosinophils Relative: 2 %
HCT: 27.3 % — ABNORMAL LOW (ref 39.0–52.0)
Hemoglobin: 8.3 g/dL — ABNORMAL LOW (ref 13.0–17.0)
Immature Granulocytes: 0 %
Lymphocytes Relative: 32 %
Lymphs Abs: 1.4 10*3/uL (ref 0.7–4.0)
MCH: 22 pg — ABNORMAL LOW (ref 26.0–34.0)
MCHC: 30.4 g/dL (ref 30.0–36.0)
MCV: 72.4 fL — ABNORMAL LOW (ref 80.0–100.0)
Monocytes Absolute: 0.4 10*3/uL (ref 0.1–1.0)
Monocytes Relative: 10 %
Neutro Abs: 2.5 10*3/uL (ref 1.7–7.7)
Neutrophils Relative %: 55 %
Platelets: 162 10*3/uL (ref 150–400)
RBC: 3.77 MIL/uL — ABNORMAL LOW (ref 4.22–5.81)
RDW: 19.8 % — ABNORMAL HIGH (ref 11.5–15.5)
WBC: 4.5 10*3/uL (ref 4.0–10.5)
nRBC: 0 % (ref 0.0–0.2)

## 2022-05-10 LAB — COMPREHENSIVE METABOLIC PANEL
ALT: 21 U/L (ref 0–44)
AST: 30 U/L (ref 15–41)
Albumin: 3.6 g/dL (ref 3.5–5.0)
Alkaline Phosphatase: 62 U/L (ref 38–126)
Anion gap: 8 (ref 5–15)
BUN: 13 mg/dL (ref 8–23)
CO2: 23 mmol/L (ref 22–32)
Calcium: 8.8 mg/dL — ABNORMAL LOW (ref 8.9–10.3)
Chloride: 106 mmol/L (ref 98–111)
Creatinine, Ser: 1.04 mg/dL (ref 0.61–1.24)
GFR, Estimated: >60 mL/min (ref >60)
Glucose, Bld: 90 mg/dL (ref 70–99)
Potassium: 3.7 mmol/L (ref 3.5–5.1)
Sodium: 137 mmol/L (ref 135–145)
Total Bilirubin: 0.8 mg/dL (ref 0.3–1.2)
Total Protein: 7.8 g/dL (ref 6.5–8.1)

## 2022-05-10 LAB — RAPID URINE DRUG SCREEN, HOSP PERFORMED
Amphetamines: NOT DETECTED
Barbiturates: NOT DETECTED
Benzodiazepines: POSITIVE — AB
Cocaine: POSITIVE — AB
Opiates: POSITIVE — AB
Tetrahydrocannabinol: NOT DETECTED

## 2022-05-10 LAB — ETHANOL: Alcohol, Ethyl (B): 156 mg/dL — ABNORMAL HIGH (ref <10)

## 2022-05-10 LAB — RESP PANEL BY RT-PCR (FLU A&B, COVID) ARPGX2
Influenza A by PCR: NEGATIVE
Influenza B by PCR: NEGATIVE
SARS Coronavirus 2 by RT PCR: NEGATIVE

## 2022-05-10 LAB — MAGNESIUM: Magnesium: 2 mg/dL (ref 1.7–2.4)

## 2022-05-10 LAB — ACETAMINOPHEN LEVEL: Acetaminophen (Tylenol), Serum: <10 ug/mL — ABNORMAL LOW (ref 10–30)

## 2022-05-10 LAB — SALICYLATE LEVEL: Salicylate Lvl: <7 mg/dL — ABNORMAL LOW (ref 7.0–30.0)

## 2022-05-10 MED ORDER — THIAMINE HCL 100 MG PO TABS
100.0000 mg | ORAL_TABLET | Freq: Every day | ORAL | Status: DC
  Administered 2022-05-11: 100 mg via ORAL
  Filled 2022-05-10 (×2): qty 1

## 2022-05-10 MED ORDER — THIAMINE HCL 100 MG/ML IJ SOLN
100.0000 mg | Freq: Once | INTRAMUSCULAR | Status: AC
Start: 2022-05-10 — End: 2022-05-10
  Administered 2022-05-10: 100 mg via INTRAVENOUS
  Filled 2022-05-10: qty 2

## 2022-05-10 MED ORDER — LORAZEPAM 2 MG/ML IJ SOLN
1.0000 mg | INTRAMUSCULAR | Status: DC | PRN
  Administered 2022-05-10: 1 mg via INTRAVENOUS
  Administered 2022-05-10: 2 mg via INTRAVENOUS
  Administered 2022-05-11: 1 mg via INTRAVENOUS
  Filled 2022-05-10 (×3): qty 1

## 2022-05-10 MED ORDER — ADULT MULTIVITAMIN W/MINERALS CH
1.0000 | ORAL_TABLET | Freq: Every day | ORAL | Status: DC
  Administered 2022-05-10 – 2022-05-11 (×2): 1 via ORAL
  Filled 2022-05-10 (×2): qty 1

## 2022-05-10 MED ORDER — LORAZEPAM 1 MG PO TABS
1.0000 mg | ORAL_TABLET | ORAL | Status: DC | PRN
  Administered 2022-05-11: 1 mg via ORAL
  Filled 2022-05-10: qty 1

## 2022-05-10 MED ORDER — LACTATED RINGERS IV BOLUS
1000.0000 mL | Freq: Once | INTRAVENOUS | Status: AC
  Administered 2022-05-10: 1000 mL via INTRAVENOUS

## 2022-05-10 MED ORDER — LIDOCAINE-EPINEPHRINE (PF) 2 %-1:200000 IJ SOLN
10.0000 mL | Freq: Once | INTRAMUSCULAR | Status: DC
  Filled 2022-05-10: qty 20

## 2022-05-10 MED ORDER — THIAMINE HCL 100 MG/ML IJ SOLN: 100.0000 mg | Freq: Every day | INTRAMUSCULAR | Status: DC

## 2022-05-10 MED ORDER — FOLIC ACID 1 MG PO TABS
1.0000 mg | ORAL_TABLET | Freq: Every day | ORAL | Status: DC
  Administered 2022-05-10 – 2022-05-11 (×2): 1 mg via ORAL
  Filled 2022-05-10 (×2): qty 1

## 2022-05-10 NOTE — Progress Notes (Signed)
Inpatient Behavioral Health Placement  Pt meets inpatient criteria per Earney Navy, NP. There are no available beds at [facility] per [facility] Malva Limes, RN. Referral was sent to the following facilities;   Destination Service Provider Address Phone Fax  [facility]  [address] [place] [address] [phone]  [facility] [address]  [address] [place] [address] [phone]  [facility]  [address] [place] [address] [phone]  [facility]-Geriatric  [address] [place], [place] [address] [phone]  [facility] Center-Adult  [address] [place], [place] [address] [phone]  [facility]  [address] [place] [address] [phone]  [facility]  [address] [place] [address] [phone]  [facility]  [address] [place] [address] [phone]  [facility]  [address] [place] [address] [phone]  [facility]  [address] [place] [address] [phone]  [facility]  [address], [place] [address] [phone]  [facility]  [address] [place] [address] [phone]  [facility]  [address] [place] [place] [address] [phone]  [facility]  [address], [place] [address] [phone]  [facility]  [address] [place] [address] [phone]  [facility]  [address] [place] [address] [phone]    Situation ongoing,  CSW will follow  up.   Maryjean Ka, MSW, [facility] 05/10/2022  @ 7:17 PM

## 2022-05-10 NOTE — ED Notes (Signed)
Per Staci Acosta, LCSW:  Accepted [facility] tomorrow 05/11/22 PENDING Negative COVID-19 by Beatriz Chancellor, NP. Pt can arrive at 10:00am, and the phone number [phone].

## 2022-05-10 NOTE — Progress Notes (Signed)
CSW requested that [facility] Malva Limes, RN review pt for [facility] placement. CSW will assist and follow with placement.   Maryjean Ka, MSW, [facility] 05/10/2022 6:17 PM
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
## 2022-05-10 NOTE — Progress Notes (Signed)
TOC CSW attached substance abuse resources  to pt's AVS.   Valentina Shaggy.Ridley Schewe, MSW, LCSWA [facility]  Transitions of Care Clinical Social Worker I Direct Dial: [phone]  Fax: [phone] [email or web address]

## 2022-05-10 NOTE — Progress Notes (Signed)
Accepted [facility] tomorrow 05/11/22 PENDING Negative COVID-19 by Beatriz Chancellor, NP. Pt can arrive at 10:00am, and the phone number [phone].  Pt meets inpatient criteria per Earney Navy, NP.  Care Team notified: Letitia Caul, RN.  Maryjean Ka, MSW, [facility] 05/10/2022 8:50 PM

## 2022-05-10 NOTE — Progress Notes (Signed)
Pt under review at [facility]. CSW will assist and follow.  Maryjean Ka, MSW, LCSWA 05/10/2022 8:30 PM

## 2022-05-11 DIAGNOSIS — S51812A Laceration without foreign body of left forearm, initial encounter: Secondary | ICD-10-CM | POA: Diagnosis not present

## 2022-05-11 NOTE — ED Notes (Signed)
Sheriff at bedside and ready for transport.   EMTALA signed by EDP.   VS updated.   Sheriff given pts bag. 1 belongings bag.

## 2022-05-11 NOTE — ED Notes (Addendum)
Report given to [place], RN at [facility] [phone]  Pt IVC'D.   Sheriff transport dispatched and will call this RN when on the way.

## 2022-05-11 NOTE — ED Notes (Signed)
Unable to complete CIWA att. Pt allowed to sleep.

## 2022-05-11 NOTE — ED Provider Notes (Addendum)
Emergency Medicine Observation Re-evaluation Note  [name] is a 66 y.o. male, seen on rounds today.  Pt initially presented to the ED for complaints of Suicide Attempt, Extremity Laceration, and Alcohol Intoxication Currently, the patient is resting comfortably.  Physical Exam  BP (!) 158/84   Pulse 81   Temp 100.2 F (37.9 C) (Oral)   Resp 19   SpO2 98%  Physical Exam General: No acute distress Cardiac: Regular rate Lungs: No respiratory distress Psych: Currently calm  ED Course / MDM  EKG:   I have reviewed the labs performed to date as well as medications administered while in observation.  Recent changes in the last 24 hours include no acute changes.  Laceration has been repaired.  Plan  Current plan is for patient to be transferred to another facility.  [name] is not under involuntary commitment.  Patient has a low-grade fever.  He has no elevated white count, and has no UTI-like symptoms or abdominal pain. Likely the fever is because of acute inflammation from the injury yesterday. We will reassess and consider prophylactic treatment.   Derwood Kaplan, MD 05/11/22 1147  11:57 AM Plan is to not start him on any empiric treatment. I have requested the patient and the transporting Sheriff to ensure the excepting facility changes the dressing twice a day.  If there is any concerns for infection, they can return the patient to the ER for further assessment.   Derwood Kaplan, MD 05/11/22 1158

## 2022-05-11 NOTE — ED Notes (Signed)
Unable to complete CIWA att. Pt allowed to sleep. 

## 2022-05-23 NOTE — Progress Notes (Signed)
Erroneous encounter-disregard

## 2022-05-24 ENCOUNTER — Ambulatory Visit (HOSPITAL_COMMUNITY)
Admission: EM | Admit: 2022-05-24 | Discharge: 2022-05-25 | Disposition: A | Discharge status: Home / Self Care | Payer: Medicare Other | Attending: Nurse Practitioner | Admitting: Nurse Practitioner

## 2022-05-24 DIAGNOSIS — R45851 Suicidal ideations: Secondary | ICD-10-CM | POA: Insufficient documentation

## 2022-05-24 DIAGNOSIS — F1094 Alcohol use, unspecified with alcohol-induced mood disorder: Secondary | ICD-10-CM | POA: Insufficient documentation

## 2022-05-24 DIAGNOSIS — F1190 Opioid use, unspecified, uncomplicated: Secondary | ICD-10-CM | POA: Diagnosis not present

## 2022-05-24 DIAGNOSIS — F250 Schizoaffective disorder, bipolar type: Secondary | ICD-10-CM | POA: Diagnosis present

## 2022-05-24 DIAGNOSIS — Z5900 Homelessness unspecified: Secondary | ICD-10-CM | POA: Diagnosis not present

## 2022-05-24 DIAGNOSIS — Z20822 Contact with and (suspected) exposure to covid-19: Secondary | ICD-10-CM | POA: Diagnosis not present

## 2022-05-24 DIAGNOSIS — F332 Major depressive disorder, recurrent severe without psychotic features: Secondary | ICD-10-CM

## 2022-05-24 DIAGNOSIS — F1490 Cocaine use, unspecified, uncomplicated: Secondary | ICD-10-CM | POA: Insufficient documentation

## 2022-05-24 DIAGNOSIS — Z9151 Personal history of suicidal behavior: Secondary | ICD-10-CM | POA: Insufficient documentation

## 2022-05-24 DIAGNOSIS — F1090 Alcohol use, unspecified, uncomplicated: Secondary | ICD-10-CM

## 2022-05-24 LAB — ETHANOL: Alcohol, Ethyl (B): 99 mg/dL — ABNORMAL HIGH (ref <10)

## 2022-05-24 LAB — COMPREHENSIVE METABOLIC PANEL
ALT: 26 U/L (ref 0–44)
AST: 36 U/L (ref 15–41)
Albumin: 3.6 g/dL (ref 3.5–5.0)
Alkaline Phosphatase: 77 U/L (ref 38–126)
Anion gap: 10 (ref 5–15)
BUN: 12 mg/dL (ref 8–23)
CO2: 21 mmol/L — ABNORMAL LOW (ref 22–32)
Calcium: 9 mg/dL (ref 8.9–10.3)
Chloride: 108 mmol/L (ref 98–111)
Creatinine, Ser: 1.04 mg/dL (ref 0.61–1.24)
GFR, Estimated: >60 mL/min (ref >60)
Glucose, Bld: 100 mg/dL — ABNORMAL HIGH (ref 70–99)
Potassium: 3.9 mmol/L (ref 3.5–5.1)
Sodium: 139 mmol/L (ref 135–145)
Total Bilirubin: 1.2 mg/dL (ref 0.3–1.2)
Total Protein: 7.4 g/dL (ref 6.5–8.1)

## 2022-05-24 LAB — CBC WITH DIFFERENTIAL/PLATELET
Abs Immature Granulocytes: 0.01 10*3/uL (ref 0.00–0.07)
Basophils Absolute: 0.1 10*3/uL (ref 0.0–0.1)
Basophils Relative: 2 %
Eosinophils Absolute: 0.2 10*3/uL (ref 0.0–0.5)
Eosinophils Relative: 3 %
HCT: 27.4 % — ABNORMAL LOW (ref 39.0–52.0)
Hemoglobin: 8.4 g/dL — ABNORMAL LOW (ref 13.0–17.0)
Immature Granulocytes: 0 %
Lymphocytes Relative: 45 %
Lymphs Abs: 2.4 10*3/uL (ref 0.7–4.0)
MCH: 21.1 pg — ABNORMAL LOW (ref 26.0–34.0)
MCHC: 30.7 g/dL (ref 30.0–36.0)
MCV: 68.8 fL — ABNORMAL LOW (ref 80.0–100.0)
Monocytes Absolute: 0.6 10*3/uL (ref 0.1–1.0)
Monocytes Relative: 11 %
Neutro Abs: 2 10*3/uL (ref 1.7–7.7)
Neutrophils Relative %: 39 %
Platelets: 149 10*3/uL — ABNORMAL LOW (ref 150–400)
RBC: 3.98 MIL/uL — ABNORMAL LOW (ref 4.22–5.81)
RDW: 19.3 % — ABNORMAL HIGH (ref 11.5–15.5)
WBC: 5.3 10*3/uL (ref 4.0–10.5)
nRBC: 0 % (ref 0.0–0.2)

## 2022-05-24 LAB — RESP PANEL BY RT-PCR (FLU A&B, COVID) ARPGX2
Influenza A by PCR: NEGATIVE
Influenza B by PCR: NEGATIVE
SARS Coronavirus 2 by RT PCR: NEGATIVE

## 2022-05-24 LAB — HEMOGLOBIN A1C
Hgb A1c MFr Bld: 5.2 % (ref 4.8–5.6)
Mean Plasma Glucose: 102.54 mg/dL

## 2022-05-24 LAB — POC SARS CORONAVIRUS 2 AG: SARSCOV2ONAVIRUS 2 AG: NEGATIVE

## 2022-05-24 LAB — TSH: TSH: 2.834 u[IU]/mL (ref 0.350–4.500)

## 2022-05-24 MED ORDER — ONDANSETRON 4 MG PO TBDP: 4.0000 mg | ORAL_TABLET | Freq: Four times a day (QID) | ORAL | Status: DC | PRN

## 2022-05-24 MED ORDER — LORAZEPAM 1 MG PO TABS
1.0000 mg | ORAL_TABLET | ORAL | Status: AC | PRN
  Administered 2022-05-25: 1 mg via ORAL
  Filled 2022-05-24: qty 1

## 2022-05-24 MED ORDER — ALUM & MAG HYDROXIDE-SIMETH 200-200-20 MG/5ML PO SUSP: 30.0000 mL | ORAL | Status: DC | PRN

## 2022-05-24 MED ORDER — MAGNESIUM HYDROXIDE 400 MG/5ML PO SUSP: 30.0000 mL | Freq: Every day | ORAL | Status: DC | PRN

## 2022-05-24 MED ORDER — ACETAMINOPHEN 325 MG PO TABS: 650.0000 mg | ORAL_TABLET | Freq: Four times a day (QID) | ORAL | Status: DC | PRN

## 2022-05-24 MED ORDER — HYDROXYZINE HCL 25 MG PO TABS
25.0000 mg | ORAL_TABLET | Freq: Four times a day (QID) | ORAL | Status: DC | PRN
  Administered 2022-05-25: 25 mg via ORAL
  Filled 2022-05-24: qty 1

## 2022-05-24 MED ORDER — LORAZEPAM 1 MG PO TABS: 1.0000 mg | ORAL_TABLET | Freq: Every day | ORAL | Status: DC

## 2022-05-24 MED ORDER — LORAZEPAM 1 MG PO TABS
1.0000 mg | ORAL_TABLET | Freq: Four times a day (QID) | ORAL | Status: DC
  Administered 2022-05-24 – 2022-05-25 (×2): 1 mg via ORAL
  Filled 2022-05-24 (×2): qty 1

## 2022-05-24 MED ORDER — ZIPRASIDONE MESYLATE 20 MG IM SOLR: 20.0000 mg | INTRAMUSCULAR | Status: DC | PRN

## 2022-05-24 MED ORDER — THIAMINE HCL 100 MG/ML IJ SOLN
100.0000 mg | Freq: Once | INTRAMUSCULAR | Status: AC
  Administered 2022-05-24: 100 mg via INTRAMUSCULAR
  Filled 2022-05-24: qty 2

## 2022-05-24 MED ORDER — LORAZEPAM 1 MG PO TABS
1.0000 mg | ORAL_TABLET | Freq: Four times a day (QID) | ORAL | Status: DC | PRN
  Administered 2022-05-24: 1 mg via ORAL
  Filled 2022-05-24: qty 1

## 2022-05-24 MED ORDER — OLANZAPINE 10 MG PO TBDP: 10.0000 mg | ORAL_TABLET | Freq: Three times a day (TID) | ORAL | Status: DC | PRN

## 2022-05-24 MED ORDER — THIAMINE HCL 100 MG PO TABS
100.0000 mg | ORAL_TABLET | Freq: Every day | ORAL | Status: DC
  Administered 2022-05-25: 100 mg via ORAL
  Filled 2022-05-24: qty 1

## 2022-05-24 MED ORDER — LOPERAMIDE HCL 2 MG PO CAPS: 2.0000 mg | ORAL_CAPSULE | ORAL | Status: DC | PRN

## 2022-05-24 MED ORDER — ADULT MULTIVITAMIN W/MINERALS CH
1.0000 | ORAL_TABLET | Freq: Every day | ORAL | Status: DC
  Administered 2022-05-24: 1 via ORAL
  Filled 2022-05-24: qty 1

## 2022-05-24 MED ORDER — LORAZEPAM 1 MG PO TABS: 1.0000 mg | ORAL_TABLET | Freq: Two times a day (BID) | ORAL | Status: DC

## 2022-05-24 MED ORDER — LORAZEPAM 1 MG PO TABS: 1.0000 mg | ORAL_TABLET | Freq: Three times a day (TID) | ORAL | Status: DC

## 2022-05-24 NOTE — ED Notes (Signed)
Pt resting in bed. Respirations even and unlabored. Monitoring for safety. 

## 2022-05-24 NOTE — ED Notes (Signed)
Staff unable to perform EKG at this time due the stickers not sticking to patients chest. Patient had a very hairy chest and was unable to get EKG at this time.
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
## 2022-05-24 NOTE — ED Provider Notes (Signed)
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  13.0 - 17.0  g/dL Final   HCT 60/60/0459 38.0 (L)  39.0 - 52.0 % Final   Comment 04/20/2022 NOTIFIED PHYSICIAN   Final   WBC 04/20/2022 6.9  4.0 - 10.5 K/uL Final  RBC 04/20/2022 4.91  4.22 - 5.81 MIL/uL Final   Hemoglobin 04/20/2022 11.1 (L)  13.0 - 17.0 g/dL Final   HCT 16/07/9603 38.0 (L)  39.0 - 52.0 % Final   MCV 04/20/2022 77.4 (L)  80.0 - 100.0 fL Final   MCH 04/20/2022 22.6 (L)  26.0 - 34.0 pg Final   MCHC 04/20/2022 29.2 (L)  30.0 - 36.0 g/dL Final   RDW 54/06/8118 21.0 (H)  11.5 - 15.5 % Final   Platelets 04/20/2022 139 (L)  150 - 400 K/uL Final   nRBC 04/20/2022 0.0  0.0 - 0.2 % Final   Performed at [facility], [address] [place], [place] [address]   Alcohol, Ethyl (B) 04/20/2022 262 (H)  <10 mg/dL Final   Comment: (NOTE) Lowest detectable limit for serum alcohol is 10 mg/dL.  For medical purposes only. Performed at [facility], [address] [place], [place] [address]    Color, Urine 04/20/2022 YELLOW  YELLOW Final   APPearance 04/20/2022 CLEAR  CLEAR Final   Specific Gravity, Urine 04/20/2022 1.008  1.005 - 1.030 Final   pH 04/20/2022 7.0  5.0 - 8.0 Final   Glucose, UA 04/20/2022 NEGATIVE  NEGATIVE mg/dL Final   Hgb urine dipstick 04/20/2022 NEGATIVE  NEGATIVE Final   Bilirubin Urine 04/20/2022 NEGATIVE  NEGATIVE Final   Ketones, ur 04/20/2022 NEGATIVE  NEGATIVE mg/dL Final   Protein, ur 30/86/5784 NEGATIVE  NEGATIVE mg/dL Final   Nitrite 69/62/9528 NEGATIVE  NEGATIVE Final   Leukocytes,Ua 04/20/2022 NEGATIVE  NEGATIVE Final   Performed at [facility], [address], [place], [place] [address]   Lactic Acid, Venous 04/20/2022 3.2 (HH)  0.5 - 1.9 mmol/L Final   Comment: CRITICAL RESULT CALLED TO, READ BACK BY AND VERIFIED WITH: M.RUGGIERO,RN  04/20/2022 [email or web address] Performed at [facility], [address] [place], [place] [address]    Prothrombin Time 04/20/2022 14.1  11.4 - 15.2 seconds Final   INR 04/20/2022 1.1  0.8 - 1.2 Final    Comment: (NOTE) INR goal varies based on device and disease states. Performed at [facility], [address], [place], [place] [address]    Blood Bank Specimen 04/20/2022 SAMPLE AVAILABLE FOR TESTING   Final   Sample Expiration 04/20/2022    Final                   Value:04/21/2022,2359 Performed at [facility], [address] [place], [place] [address]    Sodium 04/21/2022 141  135 - 145 mmol/L Final   Potassium 04/21/2022 4.1  3.5 - 5.1 mmol/L Final   Chloride 04/21/2022 102  98 - 111 mmol/L Final   CO2 04/21/2022 25  22 - 32 mmol/L Final   Glucose, Bld 04/21/2022 104 (H)  70 - 99 mg/dL Final   Glucose reference range applies only to samples taken after fasting for at least 8 hours.   BUN 04/21/2022 12  8 - 23 mg/dL Final   Creatinine, Ser 04/21/2022 0.97  0.61 - 1.24 mg/dL Final   Calcium 47/42/5956 9.1  8.9 - 10.3 mg/dL Final   GFR, Estimated 04/21/2022 >60  >60 mL/min Final   Comment: (NOTE) Calculated using the CKD-EPI Creatinine Equation (2021)    Anion gap 04/21/2022 14  5 - 15 Final   Performed at [facility], [address], [place], [place] [address]   WBC 04/21/2022 5.8  4.0 - 10.5 K/uL Final  RBC 04/21/2022 3.94 (L)  4.22 - 5.81 MIL/uL Final   Hemoglobin 04/21/2022 9.1 (L)  13.0 - 17.0 g/dL Final   HCT 16/07/9603 29.3 (L)  39.0 - 52.0 % Final   MCV 04/21/2022 74.4 (L)  80.0 - 100.0 fL Final   MCH 04/21/2022 23.1 (L)  26.0 - 34.0 pg Final   MCHC 04/21/2022 31.1  30.0 - 36.0 g/dL Final   RDW 54/06/8118 20.3 (H)  11.5 - 15.5 % Final   Platelets 04/21/2022 99 (L)  150 - 400 K/uL Final   Comment: Immature Platelet Fraction may be clinically indicated, consider ordering this additional test [id number] REPEATED TO VERIFY PLATELET COUNT CONFIRMED BY SMEAR    nRBC 04/21/2022 0.0  0.0 - 0.2 % Final   Performed at [facility], [address] [place], [place] [address]   HIV Screen 4th Generation wRfx 04/21/2022 Non Reactive  Non Reactive Final    Performed at [facility], [address] [place], [place] [address]   Sodium 04/22/2022 138  135 - 145 mmol/L Final   Potassium 04/22/2022 3.7  3.5 - 5.1 mmol/L Final   Chloride 04/22/2022 103  98 - 111 mmol/L Final   CO2 04/22/2022 26  22 - 32 mmol/L Final   Glucose, Bld 04/22/2022 124 (H)  70 - 99 mg/dL Final   Glucose reference range applies only to samples taken after fasting for at least 8 hours.   BUN 04/22/2022 9  8 - 23 mg/dL Final   Creatinine, Ser 04/22/2022 0.90  0.61 - 1.24 mg/dL Final   Calcium 69/62/9528 8.6 (L)  8.9 - 10.3 mg/dL Final   GFR, Estimated 04/22/2022 >60  >60 mL/min Final   Comment: (NOTE) Calculated using the CKD-EPI Creatinine Equation (2021)    Anion gap 04/22/2022 9  5 - 15 Final   Performed at [facility], [address], [place], [place] [address]   WBC 04/22/2022 3.6 (L)  4.0 - 10.5 K/uL Final   RBC 04/22/2022 3.55 (L)  4.22 - 5.81 MIL/uL Final   Hemoglobin 04/22/2022 8.2 (L)  13.0 - 17.0 g/dL Final   Comment: Reticulocyte Hemoglobin testing may be clinically indicated, consider ordering this additional test [id number]    HCT 04/22/2022 26.6 (L)  39.0 - 52.0 % Final   MCV 04/22/2022 74.9 (L)  80.0 - 100.0 fL Final   MCH 04/22/2022 23.1 (L)  26.0 - 34.0 pg Final   MCHC 04/22/2022 30.8  30.0 - 36.0 g/dL Final   RDW 36/64/4034 20.5 (H)  11.5 - 15.5 % Final   Platelets 04/22/2022 73 (L)  150 - 400 K/uL Final   Comment: Immature Platelet Fraction may be clinically indicated, consider ordering this additional test [id number] REPEATED TO VERIFY    nRBC 04/22/2022 0.0  0.0 - 0.2 % Final   Performed at [facility], [address], [place], [place] [address]   WBC 04/23/2022 3.7 (L)  4.0 - 10.5 K/uL Final   RBC 04/23/2022 3.61 (L)  4.22 - 5.81 MIL/uL Final   Hemoglobin 04/23/2022 8.4 (L)  13.0 - 17.0 g/dL Final   Comment: Reticulocyte Hemoglobin testing may be clinically indicated, consider ordering this additional test  [id number]    HCT 04/23/2022 27.0 (L)  39.0 - 52.0 % Final   MCV 04/23/2022 74.8 (L)  80.0 - 100.0 fL Final   MCH 04/23/2022 23.3 (L)  26.0 - 34.0 pg Final   MCHC 04/23/2022 31.1  30.0 - 36.0  g/dL Final   RDW 16/07/9603 20.9 (H)  11.5 - 15.5 % Final   Platelets 04/23/2022 73 (L)  150 - 400 K/uL Final   Comment: Immature Platelet Fraction may be clinically indicated, consider ordering this additional test [id number] CONSISTENT WITH PREVIOUS RESULT REPEATED TO VERIFY    nRBC 04/23/2022 0.0  0.0 - 0.2 % Final   Performed at [facility], [address], [place], [place] [address]   WBC 04/26/2022 3.8 (L)  4.0 - 10.5 K/uL Final   RBC 04/26/2022 3.34 (L)  4.22 - 5.81 MIL/uL Final   Hemoglobin 04/26/2022 7.7 (L)  13.0 - 17.0 g/dL Final   Comment: Reticulocyte Hemoglobin testing may be clinically indicated, consider ordering this additional test [id number]    HCT 04/26/2022 25.1 (L)  39.0 - 52.0 % Final   MCV 04/26/2022 75.1 (L)  80.0 - 100.0 fL Final   MCH 04/26/2022 23.1 (L)  26.0 - 34.0 pg Final   MCHC 04/26/2022 30.7  30.0 - 36.0 g/dL Final   RDW 65/78/4696 20.3 (H)  11.5 - 15.5 % Final   Platelets 04/26/2022 98 (L)  150 - 400 K/uL Final   REPEATED TO VERIFY   nRBC 04/26/2022 0.0  0.0 - 0.2 % Final   Performed at [facility], [address], [place], [place] [address]   Sodium 04/26/2022 140  135 - 145 mmol/L Final   Potassium 04/26/2022 4.5  3.5 - 5.1 mmol/L Final   Chloride 04/26/2022 105  98 - 111 mmol/L Final   CO2 04/26/2022 28  22 - 32 mmol/L Final   Glucose, Bld 04/26/2022 101 (H)  70 - 99 mg/dL Final   Glucose reference range applies only to samples taken after fasting for at least 8 hours.   BUN 04/26/2022 12  8 - 23 mg/dL Final   Creatinine, Ser 04/26/2022 1.04  0.61 - 1.24 mg/dL Final   Calcium 41/32/4401 8.8 (L)  8.9 - 10.3 mg/dL Final   GFR, Estimated 04/26/2022 >60  >60 mL/min Final   Comment: (NOTE) Calculated using the CKD-EPI Creatinine Equation  (2021)    Anion gap 04/26/2022 7  5 - 15 Final   Performed at [facility], [address], [place], [place] [address]   WBC 04/27/2022 3.8 (L)  4.0 - 10.5 K/uL Final   RBC 04/27/2022 3.59 (L)  4.22 - 5.81 MIL/uL Final   Hemoglobin 04/27/2022 8.2 (L)  13.0 - 17.0 g/dL Final   Comment: Reticulocyte Hemoglobin testing may be clinically indicated, consider ordering this additional test [id number]    HCT 04/27/2022 27.1 (L)  39.0 - 52.0 % Final   MCV 04/27/2022 75.5 (L)  80.0 - 100.0 fL Final   MCH 04/27/2022 22.8 (L)  26.0 - 34.0 pg Final   MCHC 04/27/2022 30.3  30.0 - 36.0 g/dL Final   RDW 74/25/9563 20.1 (H)  11.5 - 15.5 % Final   Platelets 04/27/2022 114 (L)  150 - 400 K/uL Final   REPEATED TO VERIFY   nRBC 04/27/2022 0.0  0.0 - 0.2 % Final   Performed at [facility], [address] [place], [place] [address]   Sodium 04/27/2022 139  135 - 145 mmol/L Final   Potassium 04/27/2022 4.2  3.5 - 5.1 mmol/L Final   Chloride 04/27/2022 108  98 - 111 mmol/L Final   CO2 04/27/2022 25  22 - 32 mmol/L Final   Glucose, Bld 04/27/2022 117 (H)  70 - 99 mg/dL  Final   Glucose reference range applies only to samples taken after fasting for at least 8 hours.   BUN 04/27/2022 9  8 - 23 mg/dL Final   Creatinine, Ser 04/27/2022 1.16  0.61 - 1.24 mg/dL Final   Calcium 16/07/9603 8.7 (L)  8.9 - 10.3 mg/dL Final   GFR, Estimated 04/27/2022 >60  >60 mL/min Final   Comment: (NOTE) Calculated using the CKD-EPI Creatinine Equation (2021)    Anion gap 04/27/2022 6  5 - 15 Final   Performed at [facility], [address] [place], [place] [address]   WBC 04/28/2022 4.5  4.0 - 10.5 K/uL Final   RBC 04/28/2022 3.39 (L)  4.22 - 5.81 MIL/uL Final   Hemoglobin 04/28/2022 7.7 (L)  13.0 - 17.0 g/dL Final   Comment: Reticulocyte Hemoglobin testing may be clinically indicated, consider ordering this additional test [id number]    HCT 04/28/2022 25.4 (L)  39.0 - 52.0 % Final   MCV 04/28/2022 74.9  (L)  80.0 - 100.0 fL Final   MCH 04/28/2022 22.7 (L)  26.0 - 34.0 pg Final   MCHC 04/28/2022 30.3  30.0 - 36.0 g/dL Final   RDW 95/62/1308 19.9 (H)  11.5 - 15.5 % Final   Platelets 04/28/2022 125 (L)  150 - 400 K/uL Final   REPEATED TO VERIFY   nRBC 04/28/2022 0.0  0.0 - 0.2 % Final   Performed at [facility], [address] [place], [place] [address]   WBC 04/29/2022 4.3  4.0 - 10.5 K/uL Final   RBC 04/29/2022 3.39 (L)  4.22 - 5.81 MIL/uL Final   Hemoglobin 04/29/2022 7.8 (L)  13.0 - 17.0 g/dL Final   Comment: Reticulocyte Hemoglobin testing may be clinically indicated, consider ordering this additional test [id number]    HCT 04/29/2022 25.1 (L)  39.0 - 52.0 % Final   MCV 04/29/2022 74.0 (L)  80.0 - 100.0 fL Final   MCH 04/29/2022 23.0 (L)  26.0 - 34.0 pg Final   MCHC 04/29/2022 31.1  30.0 - 36.0 g/dL Final   RDW 41/32/4401 19.9 (H)  11.5 - 15.5 % Final   Platelets 04/29/2022 125 (L)  150 - 400 K/uL Final   REPEATED TO VERIFY   nRBC 04/29/2022 0.0  0.0 - 0.2 % Final   Performed at [facility], [address], [place], [place] [address]   WBC 05/01/2022 3.6 (L)  4.0 - 10.5 K/uL Final   RBC 05/01/2022 3.13 (L)  4.22 - 5.81 MIL/uL Final   Hemoglobin 05/01/2022 7.1 (L)  13.0 - 17.0 g/dL Final   Comment: Reticulocyte Hemoglobin testing may be clinically indicated, consider ordering this additional test [id number]    HCT 05/01/2022 23.0 (L)  39.0 - 52.0 % Final   MCV 05/01/2022 73.5 (L)  80.0 - 100.0 fL Final   MCH 05/01/2022 22.7 (L)  26.0 - 34.0 pg Final   MCHC 05/01/2022 30.9  30.0 - 36.0 g/dL Final   RDW 74/25/9563 19.8 (H)  11.5 - 15.5 % Final   Platelets 05/01/2022 149 (L)  150 - 400 K/uL Final   REPEATED TO VERIFY   nRBC 05/01/2022 0.0  0.0 - 0.2 % Final   Performed at [facility], [address] [place], [place] [address]   Sodium 05/01/2022 137  135 - 145 mmol/L Final   Potassium 05/01/2022 4.0  3.5 - 5.1 mmol/L Final   Chloride 05/01/2022 109  98 -  111 mmol/L Final  CO2 05/01/2022 23  22 - 32 mmol/L Final   Glucose, Bld 05/01/2022 98  70 - 99 mg/dL Final   Glucose reference range applies only to samples taken after fasting for at least 8 hours.   BUN 05/01/2022 14  8 - 23 mg/dL Final   Creatinine, Ser 05/01/2022 0.83  0.61 - 1.24 mg/dL Final   Calcium 95/62/1308 8.4 (L)  8.9 - 10.3 mg/dL Final   GFR, Estimated 05/01/2022 >60  >60 mL/min Final   Comment: (NOTE) Calculated using the CKD-EPI Creatinine Equation (2021)    Anion gap 05/01/2022 5  5 - 15 Final   Performed at [facility], [address] [place], [place] [address]   WBC 05/02/2022 7.8  4.0 - 10.5 K/uL Final   RBC 05/02/2022 3.23 (L)  4.22 - 5.81 MIL/uL Final   Hemoglobin 05/02/2022 7.2 (L)  13.0 - 17.0 g/dL Final   Comment: Reticulocyte Hemoglobin testing may be clinically indicated, consider ordering this additional test [id number]    HCT 05/02/2022 23.6 (L)  39.0 - 52.0 % Final   MCV 05/02/2022 73.1 (L)  80.0 - 100.0 fL Final   MCH 05/02/2022 22.3 (L)  26.0 - 34.0 pg Final   MCHC 05/02/2022 30.5  30.0 - 36.0 g/dL Final   RDW 41/32/4401 19.5 (H)  11.5 - 15.5 % Final   Platelets 05/02/2022 153  150 - 400 K/uL Final   REPEATED TO VERIFY   nRBC 05/02/2022 0.0  0.0 - 0.2 % Final   Performed at [facility], [address] [place], [place] [address]  Admission on 03/31/2022, Discharged on 04/02/2022  Component Date Value Ref Range Status   Sodium 03/31/2022 140  135 - 145 mmol/L Final   Potassium 03/31/2022 3.6  3.5 - 5.1 mmol/L Final   Chloride 03/31/2022 108  98 - 111 mmol/L Final   CO2 03/31/2022 19 (L)  22 - 32 mmol/L Final   Glucose, Bld 03/31/2022 84  70 - 99 mg/dL Final   Glucose reference range applies only to samples taken after fasting for at least 8 hours.   BUN 03/31/2022 9  8 - 23 mg/dL Final   Creatinine, Ser 03/31/2022 0.86  0.61 - 1.24 mg/dL Final   Calcium 36/64/4034 8.6 (L)  8.9 - 10.3 mg/dL Final   Total Protein 74/25/9563 7.2  6.5  - 8.1 g/dL Final   Albumin 87/56/4332 3.5  3.5 - 5.0 g/dL Final   AST 95/18/8416 56 (H)  15 - 41 U/L Final   ALT 03/31/2022 35  0 - 44 U/L Final   Alkaline Phosphatase 03/31/2022 61  38 - 126 U/L Final   Total Bilirubin 03/31/2022 2.0 (H)  0.3 - 1.2 mg/dL Final   GFR, Estimated 03/31/2022 >60  >60 mL/min Final   Comment: (NOTE) Calculated using the CKD-EPI Creatinine Equation (2021)    Anion gap 03/31/2022 13  5 - 15 Final   Performed at [facility], [address] [place], [place] [address]   Alcohol, Ethyl (B) 03/31/2022 203 (H)  <10 mg/dL Final   Comment: (NOTE) Lowest detectable limit for serum alcohol is 10 mg/dL.  For medical purposes only. Performed at [facility], [address], [place], [place] [address]    Salicylate Lvl 03/31/2022 <7.0 (L)  7.0 - 30.0 mg/dL Final   Performed at [facility], [address], [place], [place] [address]   Acetaminophen (Tylenol), Serum 03/31/2022 <10 (L)  10 -  30 ug/mL Final   Comment: (NOTE) Therapeutic concentrations vary significantly. A range of 10-30 ug/mL  may be an effective concentration for many patients. However, some  are best treated at concentrations outside of this range. Acetaminophen concentrations >150 ug/mL at 4 hours after ingestion  and >50 ug/mL at 12 hours after ingestion are often associated with  toxic reactions.  Performed at [facility], [address], [place], [place] [address]    WBC 03/31/2022 4.4  4.0 - 10.5 K/uL Final   RBC 03/31/2022 5.10  4.22 - 5.81 MIL/uL Final   Hemoglobin 03/31/2022 11.8 (L)  13.0 - 17.0 g/dL Final   HCT 60/45/4098 38.5 (L)  39.0 - 52.0 % Final   MCV 03/31/2022 75.5 (L)  80.0 - 100.0 fL Final   MCH 03/31/2022 23.1 (L)  26.0 - 34.0 pg Final   MCHC 03/31/2022 30.6  30.0 - 36.0 g/dL Final   RDW 11/91/4782 19.4 (H)  11.5 - 15.5 % Final   Platelets 03/31/2022 102 (L)  150 - 400 K/uL Final   Comment: Immature Platelet Fraction may be clinically indicated,  consider ordering this additional test [id number] REPEATED TO VERIFY PLATELET COUNT CONFIRMED BY SMEAR    nRBC 03/31/2022 0.0  0.0 - 0.2 % Final   Performed at [facility], [address] [place], [place] [address]   Opiates 03/31/2022 NONE DETECTED  NONE DETECTED Final   Cocaine 03/31/2022 NONE DETECTED  NONE DETECTED Final   Benzodiazepines 03/31/2022 NONE DETECTED  NONE DETECTED Final   Amphetamines 03/31/2022 NONE DETECTED  NONE DETECTED Final   Tetrahydrocannabinol 03/31/2022 NONE DETECTED  NONE DETECTED Final   Barbiturates 03/31/2022 NONE DETECTED  NONE DETECTED Final   Comment: (NOTE) DRUG SCREEN FOR MEDICAL PURPOSES ONLY.  IF CONFIRMATION IS NEEDED FOR ANY PURPOSE, NOTIFY LAB WITHIN 5 DAYS.  LOWEST DETECTABLE LIMITS FOR URINE DRUG SCREEN Drug Class                     Cutoff (ng/mL) Amphetamine and metabolites    1000 Barbiturate and metabolites    200 Benzodiazepine                 200 Tricyclics and metabolites     300 Opiates and metabolites        300 Cocaine and metabolites        300 THC                            50 Performed at [facility], [address], [place], [place] [address]    SARS Coronavirus 2 by RT PCR 03/31/2022 NEGATIVE  NEGATIVE Final   Comment: (NOTE) SARS-CoV-2 target nucleic acids are NOT DETECTED.  The SARS-CoV-2 RNA is generally detectable in upper and lower respiratory specimens during the acute phase of infection. The lowest concentration of SARS-CoV-2 viral copies this assay can detect is 250 copies / mL. A negative result does not preclude SARS-CoV-2 infection and should not be used as the sole basis for treatment or other patient management decisions.  A negative result may occur with improper specimen collection / handling, submission of specimen other than nasopharyngeal swab, presence of viral mutation(s) within the areas targeted by this assay, and inadequate number of viral copies (<250 copies / mL). A negative  result must be combined with clinical observations, patient history, and epidemiological information.  Fact Sheet for Patients:   RoadLapTop.co.za  Fact Sheet for Healthcare Providers: http://kim-miller.com/  This test is not yet approved or                           cleared by the [place] FDA and has been authorized for detection and/or diagnosis of SARS-CoV-2 by FDA under an Emergency Use Authorization (EUA).  This EUA will remain in effect (meaning this test can be used) for the duration of the COVID-19 declaration under Section 564(b)(1) of the Act, 21 U.S.C. section 360bbb-3(b)(1), unless the authorization is terminated or revoked sooner.  Performed at [facility], [address], [place], [place] [address]   Admission on 01/22/2022, Discharged on 01/22/2022  Component Date Value Ref Range Status   Lipase 01/22/2022 44  11 - 51 U/L Final   Performed at [facility], [address], [place], [place] [address]   Sodium 01/22/2022 138  135 - 145 mmol/L Final   Potassium 01/22/2022 3.4 (L)  3.5 - 5.1 mmol/L Final   Chloride 01/22/2022 105  98 - 111 mmol/L Final   CO2 01/22/2022 24  22 - 32 mmol/L Final   Glucose, Bld 01/22/2022 90  70 - 99 mg/dL Final   Glucose reference range applies only to samples taken after fasting for at least 8 hours.   BUN 01/22/2022 10  8 - 23 mg/dL Final   Creatinine, Ser 01/22/2022 0.87  0.61 - 1.24 mg/dL Final   Calcium 34/19/6222 8.9  8.9 - 10.3 mg/dL Final   Total Protein 97/98/9211 7.9  6.5 - 8.1 g/dL Final   Albumin 94/17/4081 3.7  3.5 - 5.0 g/dL Final   AST 44/81/8563 43 (H)  15 - 41 U/L Final   ALT 01/22/2022 31  0 - 44 U/L Final   Alkaline Phosphatase 01/22/2022 74  38 - 126 U/L Final   Total Bilirubin 01/22/2022 2.0 (H)  0.3 - 1.2 mg/dL Final   GFR, Estimated 01/22/2022 >60  >60 mL/min Final   Comment: (NOTE) Calculated using the CKD-EPI Creatinine Equation  (2021)    Anion gap 01/22/2022 9  5 - 15 Final   Performed at [facility], [address], [place], [place] [address]   WBC 01/22/2022 4.7  4.0 - 10.5 K/uL Final   RBC 01/22/2022 5.84 (H)  4.22 - 5.81 MIL/uL Final   Hemoglobin 01/22/2022 13.1  13.0 - 17.0 g/dL Final   HCT 26/37/8588 43.9  39.0 - 52.0 % Final   MCV 01/22/2022 75.2 (L)  80.0 - 100.0 fL Final   MCH 01/22/2022 22.4 (L)  26.0 - 34.0 pg Final   MCHC 01/22/2022 29.8 (L)  30.0 - 36.0 g/dL Final   RDW 50/27/7412 19.7 (H)  11.5 - 15.5 % Final   Platelets 01/22/2022 128 (L)  150 - 400 K/uL Final   nRBC 01/22/2022 0.0  0.0 - 0.2 % Final   Performed at [facility], [address] [place], [place] [address]   Color, Urine 01/22/2022 STRAW (A)  YELLOW Final   APPearance 01/22/2022 CLEAR  CLEAR Final   Specific Gravity, Urine 01/22/2022 1.002 (L)  1.005 - 1.030 Final   pH 01/22/2022 5.0  5.0 - 8.0 Final   Glucose, UA 01/22/2022 NEGATIVE  NEGATIVE mg/dL Final   Hgb urine dipstick 01/22/2022 NEGATIVE  NEGATIVE Final   Bilirubin Urine 01/22/2022 NEGATIVE  NEGATIVE Final   Ketones, ur 01/22/2022 NEGATIVE  NEGATIVE mg/dL Final   Protein, ur  01/22/2022 NEGATIVE  NEGATIVE mg/dL Final   Nitrite 16/07/9603 NEGATIVE  NEGATIVE Final   Leukocytes,Ua 01/22/2022 NEGATIVE  NEGATIVE Final   Performed at [facility], [address], [place], [place] [address]   Opiates 01/22/2022 NONE DETECTED  NONE DETECTED Final   Cocaine 01/22/2022 NONE DETECTED  NONE DETECTED Final   Benzodiazepines 01/22/2022 NONE DETECTED  NONE DETECTED Final   Amphetamines 01/22/2022 NONE DETECTED  NONE DETECTED Final   Tetrahydrocannabinol 01/22/2022 NONE DETECTED  NONE DETECTED Final   Barbiturates 01/22/2022 NONE DETECTED  NONE DETECTED Final   Comment: (NOTE) DRUG SCREEN FOR MEDICAL PURPOSES ONLY.  IF CONFIRMATION IS NEEDED FOR ANY PURPOSE, NOTIFY LAB WITHIN 5 DAYS.  LOWEST DETECTABLE LIMITS FOR URINE  DRUG SCREEN Drug Class                     Cutoff (ng/mL) Amphetamine and metabolites    1000 Barbiturate and metabolites    200 Benzodiazepine                 200 Tricyclics and metabolites     300 Opiates and metabolites        300 Cocaine and metabolites        300 THC                            50 Performed at [facility], [address] [place], [place] [address]    Alcohol, Ethyl (B) 01/22/2022 134 (H)  <10 mg/dL Final   Comment: (NOTE) Lowest detectable limit for serum alcohol is 10 mg/dL.  For medical purposes only. Performed at [facility], [address], [place], [place] [address]    Specimen Description 01/22/2022    Final                   Value:URINE, CLEAN CATCH Performed at [facility], [address], [place], [place] [address]    Special Requests 01/22/2022    Final                   Value:NONE Performed at [facility], [address], [place], [place] [address]    Culture 01/22/2022    Final                   Value:NO GROWTH Performed at [facility], [address] [address] [place], [place] [address]    Report Status 01/22/2022 01/23/2022 FINAL   Final   Ammonia 01/22/2022 18  9 - 35 umol/L Final   Performed at [facility], [address] [place], [place] [address]   Acetaminophen (Tylenol), Serum 01/22/2022 <10 (L)  10 - 30 ug/mL Final   Comment: (NOTE) Therapeutic concentrations vary significantly. A range of 10-30 ug/mL  may be an effective concentration for many patients. However, some  are best treated at concentrations outside of this range. Acetaminophen concentrations >150 ug/mL at 4 hours after ingestion  and >50 ug/mL at 12 hours after ingestion are often associated with  toxic reactions.  Performed at [facility], [address] [place], [place] [address]    Salicylate Lvl 01/22/2022 <7.0 (L)  7.0 - 30.0 mg/dL Final    Performed at [facility], [address], [place], [place] [address]  Admission on 01/10/2022, Discharged on 01/10/2022  Component Date Value Ref Range Status   Sodium  01/10/2022 131 (L)  135 - 145 mmol/L Final   Potassium 01/10/2022 4.0  3.5 - 5.1 mmol/L Final   Chloride 01/10/2022 98  98 - 111 mmol/L Final   CO2 01/10/2022 23  22 - 32 mmol/L Final   Glucose, Bld 01/10/2022 84  70 - 99 mg/dL Final   Glucose reference range applies only to samples taken after fasting for at least 8 hours.   BUN 01/10/2022 8  8 - 23 mg/dL Final   Creatinine, Ser 01/10/2022 0.76  0.61 - 1.24 mg/dL Final   Calcium 16/07/9603 9.1  8.9 - 10.3 mg/dL Final   Total Protein 54/06/8118 7.8  6.5 - 8.1 g/dL Final   Albumin 14/78/2956 3.8  3.5 - 5.0 g/dL Final   AST 21/30/8657 55 (H)  15 - 41 U/L Final   ALT 01/10/2022 36  0 - 44 U/L Final   Alkaline Phosphatase 01/10/2022 76  38 - 126 U/L Final   Total Bilirubin 01/10/2022 3.3 (H)  0.3 - 1.2 mg/dL Final   GFR, Estimated 01/10/2022 >60  >60 mL/min Final   Comment: (NOTE) Calculated using the CKD-EPI Creatinine Equation (2021)    Anion gap 01/10/2022 10  5 - 15 Final   Performed at [facility], [address], [place], [place] [address]   WBC 01/10/2022 5.7  4.0 - 10.5 K/uL Final   RBC 01/10/2022 5.16  4.22 - 5.81 MIL/uL Final   Hemoglobin 01/10/2022 11.8 (L)  13.0 - 17.0 g/dL Final   HCT 29/52/8413 39.0  39.0 - 52.0 % Final   MCV 01/10/2022 75.6 (L)  80.0 - 100.0 fL Final   MCH 01/10/2022 22.9 (L)  26.0 - 34.0 pg Final   MCHC 01/10/2022 30.3  30.0 - 36.0 g/dL Final   RDW 24/40/1027 18.4 (H)  11.5 - 15.5 % Final   Platelets 01/10/2022 138 (L)  150 - 400 K/uL Final   REPEATED TO VERIFY   nRBC 01/10/2022 0.0  0.0 - 0.2 % Final   Neutrophils Relative % 01/10/2022 61  % Final   Neutro Abs 01/10/2022 3.5  1.7 - 7.7 K/uL Final   Lymphocytes Relative 01/10/2022 27  % Final   Lymphs Abs 01/10/2022 1.5  0.7 - 4.0 K/uL Final   Monocytes  Relative 01/10/2022 10  % Final   Monocytes Absolute 01/10/2022 0.6  0.1 - 1.0 K/uL Final   Eosinophils Relative 01/10/2022 1  % Final   Eosinophils Absolute 01/10/2022 0.0  0.0 - 0.5 K/uL Final   Basophils Relative 01/10/2022 1  % Final   Basophils Absolute 01/10/2022 0.1  0.0 - 0.1 K/uL Final   Immature Granulocytes 01/10/2022 0  % Final   Abs Immature Granulocytes 01/10/2022 0.02  0.00 - 0.07 K/uL Final   Performed at [facility], [address], [place], [place] [address]   Opiates 01/10/2022 NONE DETECTED  NONE DETECTED Final   Cocaine 01/10/2022 NONE DETECTED  NONE DETECTED Final   Benzodiazepines 01/10/2022 NONE DETECTED  NONE DETECTED Final   Amphetamines 01/10/2022 NONE DETECTED  NONE DETECTED Final   Tetrahydrocannabinol 01/10/2022 NONE DETECTED  NONE DETECTED Final   Barbiturates 01/10/2022 NONE DETECTED  NONE DETECTED Final   Comment: (NOTE) DRUG SCREEN FOR MEDICAL PURPOSES ONLY.  IF CONFIRMATION IS NEEDED FOR ANY PURPOSE, NOTIFY LAB WITHIN 5 DAYS.  LOWEST DETECTABLE LIMITS FOR URINE DRUG SCREEN Drug Class  Cutoff (ng/mL) Amphetamine and metabolites    1000 Barbiturate and metabolites    200 Benzodiazepine                 200 Tricyclics and metabolites     300 Opiates and metabolites        300 Cocaine and metabolites        300 THC                            50 Performed at [facility], [address] [place], [place] [address]    Color, Urine 01/10/2022 YELLOW  YELLOW Final   APPearance 01/10/2022 CLEAR  CLEAR Final   Specific Gravity, Urine 01/10/2022 1.003 (L)  1.005 - 1.030 Final   pH 01/10/2022 5.0  5.0 - 8.0 Final   Glucose, UA 01/10/2022 NEGATIVE  NEGATIVE mg/dL Final   Hgb urine dipstick 01/10/2022 SMALL (A)  NEGATIVE Final   Bilirubin Urine 01/10/2022 NEGATIVE  NEGATIVE Final   Ketones, ur 01/10/2022 5 (A)  NEGATIVE mg/dL Final   Protein, ur 60/45/409803/27/2023 NEGATIVE  NEGATIVE mg/dL Final   Nitrite 11/91/478203/27/2023 NEGATIVE   NEGATIVE Final   Leukocytes,Ua 01/10/2022 NEGATIVE  NEGATIVE Final   WBC, UA 01/10/2022 0-5  0 - 5 WBC/hpf Final   Bacteria, UA 01/10/2022 NONE SEEN  NONE SEEN Final   Squamous Epithelial / LPF 01/10/2022 0-5  0 - 5 Final   Performed at [facility], [address] [place], [place] [address]   Lipase 01/10/2022 37  11 - 51 U/L Final   Performed at [facility], [address] [place], [place] [address]   Total CK 01/10/2022 163  49 - 397 U/L Final   Performed at [facility], [address] [place], [place] [address]  Admission on 01/05/2022, Discharged on 01/06/2022  Component Date Value Ref Range Status   Sodium 01/05/2022 138  135 - 145 mmol/L Final   Potassium 01/05/2022 3.7  3.5 - 5.1 mmol/L Final   Chloride 01/05/2022 103  98 - 111 mmol/L Final   CO2 01/05/2022 23  22 - 32 mmol/L Final   Glucose, Bld 01/05/2022 89  70 - 99 mg/dL Final   Glucose reference range applies only to samples taken after fasting for at least 8 hours.   BUN 01/05/2022 <5 (L)  8 - 23 mg/dL Final   Creatinine, Ser 01/05/2022 0.89  0.61 - 1.24 mg/dL Final   Calcium 62/95/284103/22/2023 9.1  8.9 - 10.3 mg/dL Final   Total Protein 32/44/010203/22/2023 7.9  6.5 - 8.1 g/dL Final   Albumin 72/53/664403/22/2023 3.8  3.5 - 5.0 g/dL Final   AST 03/47/425903/22/2023 46 (H)  15 - 41 U/L Final   ALT 01/05/2022 33  0 - 44 U/L Final   Alkaline Phosphatase 01/05/2022 79  38 - 126 U/L Final   Total Bilirubin 01/05/2022 2.1 (H)  0.3 - 1.2 mg/dL Final   GFR, Estimated 01/05/2022 >60  >60 mL/min Final   Comment: (NOTE) Calculated using the CKD-EPI Creatinine Equation (2021)    Anion gap 01/05/2022 12  5 - 15 Final   Performed at [facility], [address] [place], [place] [address]   Alcohol, Ethyl (B) 01/05/2022 142 (H)  <10 mg/dL Final   Comment: (NOTE) Lowest detectable limit for serum alcohol is 10 mg/dL.  For medical purposes only. Performed at [facility], [address]  [place], [place] [address]    WBC  01/05/2022 6.8  4.0 - 10.5 K/uL Final   RBC 01/05/2022 5.26  4.22 - 5.81 MIL/uL Final   Hemoglobin 01/05/2022 12.2 (L)  13.0 - 17.0 g/dL Final   HCT 11/91/4782 39.6  39.0 - 52.0 % Final   MCV 01/05/2022 75.3 (L)  80.0 - 100.0 fL Final   MCH 01/05/2022 23.2 (L)  26.0 - 34.0 pg Final   MCHC 01/05/2022 30.8  30.0 - 36.0 g/dL Final   RDW 95/62/1308 18.8 (H)  11.5 - 15.5 % Final   Platelets 01/05/2022 124 (L)  150 - 400 K/uL Final   REPEATED TO VERIFY   nRBC 01/05/2022 0.0  0.0 - 0.2 % Final   Performed at [facility], [address], [place], [place] [address]   Opiates 01/05/2022 NONE DETECTED  NONE DETECTED Final   Cocaine 01/05/2022 POSITIVE (A)  NONE DETECTED Final   Benzodiazepines 01/05/2022 NONE DETECTED  NONE DETECTED Final   Amphetamines 01/05/2022 NONE DETECTED  NONE DETECTED Final   Tetrahydrocannabinol 01/05/2022 NONE DETECTED  NONE DETECTED Final   Barbiturates 01/05/2022 NONE DETECTED  NONE DETECTED Final   Comment: (NOTE) DRUG SCREEN FOR MEDICAL PURPOSES ONLY.  IF CONFIRMATION IS NEEDED FOR ANY PURPOSE, NOTIFY LAB WITHIN 5 DAYS.  LOWEST DETECTABLE LIMITS FOR URINE DRUG SCREEN Drug Class                     Cutoff (ng/mL) Amphetamine and metabolites    1000 Barbiturate and metabolites    200 Benzodiazepine                 200 Tricyclics and metabolites     300 Opiates and metabolites        300 Cocaine and metabolites        300 THC                            50 Performed at [facility], [address] [place], [place] [address]    SARS Coronavirus 2 by RT PCR 01/05/2022 NEGATIVE  NEGATIVE Final   Comment: (NOTE) SARS-CoV-2 target nucleic acids are NOT DETECTED.  The SARS-CoV-2 RNA is generally detectable in upper respiratory specimens during the acute phase of infection. The lowest concentration of SARS-CoV-2 viral copies this assay can detect is 138 copies/mL. A negative result does not preclude SARS-Cov-2 infection and should not be used as the  sole basis for treatment or other patient management decisions. A negative result may occur with  improper specimen collection/handling, submission of specimen other than nasopharyngeal swab, presence of viral mutation(s) within the areas targeted by this assay, and inadequate number of viral copies(<138 copies/mL). A negative result must be combined with clinical observations, patient history, and epidemiological information. The expected result is Negative.  Fact Sheet for Patients:  BloggerCourse.com  Fact Sheet for Healthcare Providers:  SeriousBroker.it  This test is no                          t yet approved or cleared by the [place] FDA and  has been authorized for detection and/or diagnosis of SARS-CoV-2 by FDA under an Emergency Use Authorization (EUA). This EUA will remain  in effect (meaning this test can be used) for the duration of the COVID-19 declaration under Section 564(b)(1) of the Act, 21 U.S.C.section 360bbb-3(b)(1), unless the authorization  is terminated  or revoked sooner.       Influenza A by PCR 01/05/2022 NEGATIVE  NEGATIVE Final   Influenza B by PCR 01/05/2022 NEGATIVE  NEGATIVE Final   Comment: (NOTE) The Xpert Xpress SARS-CoV-2/FLU/RSV plus assay is intended as an aid in the diagnosis of influenza from Nasopharyngeal swab specimens and should not be used as a sole basis for treatment. Nasal washings and aspirates are unacceptable for Xpert Xpress SARS-CoV-2/FLU/RSV testing.  Fact Sheet for Patients: BloggerCourse.com  Fact Sheet for Healthcare Providers: SeriousBroker.it  This test is not yet approved or cleared by the [place] FDA and has been authorized for detection and/or diagnosis of SARS-CoV-2 by FDA under an Emergency Use Authorization (EUA). This EUA will remain in effect (meaning this test can be used) for the duration of  the COVID-19 declaration under Section 564(b)(1) of the Act, 21 U.S.C. section 360bbb-3(b)(1), unless the authorization is terminated or revoked.  Performed at [facility], [address], [place], [place] [address]   Admission on 12/04/2021, Discharged on 12/05/2021  Component Date Value Ref Range Status   Sodium 12/04/2021 140  135 - 145 mmol/L Final   Potassium 12/04/2021 3.9  3.5 - 5.1 mmol/L Final   Chloride 12/04/2021 105  98 - 111 mmol/L Final   CO2 12/04/2021 23  22 - 32 mmol/L Final   Glucose, Bld 12/04/2021 88  70 - 99 mg/dL Final   Glucose reference range applies only to samples taken after fasting for at least 8 hours.   BUN 12/04/2021 13  8 - 23 mg/dL Final   Creatinine, Ser 12/04/2021 0.87  0.61 - 1.24 mg/dL Final   Calcium 60/45/4098 9.1  8.9 - 10.3 mg/dL Final   GFR, Estimated 12/04/2021 >60  >60 mL/min Final   Comment: (NOTE) Calculated using the CKD-EPI Creatinine Equation (2021)    Anion gap 12/04/2021 12  5 - 15 Final   Performed at [facility], [address], [place], [place] [address]   WBC 12/04/2021 8.1  4.0 - 10.5 K/uL Final   RBC 12/04/2021 5.04  4.22 - 5.81 MIL/uL Final   Hemoglobin 12/04/2021 11.8 (L)  13.0 - 17.0 g/dL Final   HCT 78/29/5621 38.4 (L)  39.0 - 52.0 % Final   MCV 12/04/2021 76.2 (L)  80.0 - 100.0 fL Final   MCH 12/04/2021 23.4 (L)  26.0 - 34.0 pg Final   MCHC 12/04/2021 30.7  30.0 - 36.0 g/dL Final   RDW 30/86/5784 15.4  11.5 - 15.5 % Final   Platelets 12/04/2021 162  150 - 400 K/uL Final   nRBC 12/04/2021 0.0  0.0 - 0.2 % Final   Performed at [facility], [address], [place], [place] [address]   Troponin I (High Sensitivity) 12/04/2021 6  <18 ng/L Final   Comment: (NOTE) Elevated high sensitivity troponin I (hsTnI) values and significant  changes across serial measurements may suggest ACS but many other  chronic and acute conditions are known to elevate hsTnI results.  Refer to the "Links" section for chest  pain algorithms and additional  guidance. Performed at [facility], [address] [place], [place] [address]    Alcohol, Ethyl (B) 12/04/2021 146 (H)  <10 mg/dL Final   Comment: (NOTE) Lowest detectable limit for serum alcohol is 10 mg/dL.  For medical purposes only. Performed at [facility], [address], [place], [place] [address]    Salicylate Lvl 12/04/2021 <7.0 (L)  7.0 - 30.0 mg/dL Final   Performed at [facility], [address] [place], [place] [address]   Acetaminophen (Tylenol), Serum 12/04/2021 <10 (L)  10 - 30 ug/mL Final   Comment: (NOTE) Therapeutic concentrations vary significantly. A range of 10-30 ug/mL  may be an effective concentration for many patients. However, some  are best treated at concentrations outside of this range. Acetaminophen concentrations >150 ug/mL at 4 hours after ingestion  and >50 ug/mL at 12 hours after ingestion are often associated with  toxic reactions.  Performed at [facility], [address], [place], [place] [address]    Opiates 12/04/2021 NONE DETECTED  NONE DETECTED Final   Cocaine 12/04/2021 NONE DETECTED  NONE DETECTED Final   Benzodiazepines 12/04/2021 NONE DETECTED  NONE DETECTED Final   Amphetamines 12/04/2021 NONE DETECTED  NONE DETECTED Final   Tetrahydrocannabinol 12/04/2021 NONE DETECTED  NONE DETECTED Final   Barbiturates 12/04/2021 NONE DETECTED  NONE DETECTED Final   Comment: (NOTE) DRUG SCREEN FOR MEDICAL PURPOSES ONLY.  IF CONFIRMATION IS NEEDED FOR ANY PURPOSE, NOTIFY LAB WITHIN 5 DAYS.  LOWEST DETECTABLE LIMITS FOR URINE DRUG SCREEN Drug Class                     Cutoff (ng/mL) Amphetamine and metabolites    1000 Barbiturate and metabolites    200 Benzodiazepine                 200 Tricyclics and metabolites     300 Opiates and metabolites        300 Cocaine and metabolites        300 THC                            50 Performed at [facility], [address]  [place], [place] [address]    SARS Coronavirus 2 by RT PCR 12/04/2021 NEGATIVE  NEGATIVE Final   Comment: (NOTE) SARS-CoV-2 target nucleic acids are NOT DETECTED.  The SARS-CoV-2 RNA is generally detectable in upper respiratory specimens during the acute phase of infection. The lowest concentration of SARS-CoV-2 viral copies this assay can detect is 138 copies/mL. A negative result does not preclude SARS-Cov-2 infection and should not be used as the sole basis for treatment or other patient management decisions. A negative result may occur with  improper specimen collection/handling, submission of specimen other than nasopharyngeal swab, presence of viral mutation(s) within the areas targeted by this assay, and inadequate number of viral copies(<138 copies/mL). A negative result must be combined with clinical observations, patient history, and epidemiological information. The expected result is Negative.  Fact Sheet for Patients:  BloggerCourse.com  Fact Sheet for Healthcare Providers:  SeriousBroker.it  This test is no                          t yet approved or cleared by the [place] FDA and  has been authorized for detection and/or diagnosis of SARS-CoV-2 by FDA under an Emergency Use Authorization (EUA). This EUA will remain  in effect (meaning this test can be used) for the duration of the COVID-19 declaration under Section 564(b)(1) of the Act, 21 U.S.C.section 360bbb-3(b)(1), unless the authorization is terminated  or revoked sooner.       Influenza A by PCR 12/04/2021 NEGATIVE  NEGATIVE Final   Influenza B by PCR 12/04/2021 NEGATIVE  NEGATIVE Final  Comment: (NOTE) The Xpert Xpress SARS-CoV-2/FLU/RSV plus assay is intended as an aid in the diagnosis of influenza from Nasopharyngeal swab specimens and should not be used as a sole basis for treatment. Nasal washings and aspirates are unacceptable for Xpert Xpress  SARS-CoV-2/FLU/RSV testing.  Fact Sheet for Patients: BloggerCourse.com  Fact Sheet for Healthcare Providers: SeriousBroker.it  This test is not yet approved or cleared by the [place] FDA and has been authorized for detection and/or diagnosis of SARS-CoV-2 by FDA under an Emergency Use Authorization (EUA). This EUA will remain in effect (meaning this test can be used) for the duration of the COVID-19 declaration under Section 564(b)(1) of the Act, 21 U.S.C. section 360bbb-3(b)(1), unless the authorization is terminated or revoked.  Performed at [facility], [address] [place], [place] [address]    Troponin I (High Sensitivity) 12/05/2021 6  <18 ng/L Final   Comment: (NOTE) Elevated high sensitivity troponin I (hsTnI) values and significant  changes across serial measurements may suggest ACS but many other  chronic and acute conditions are known to elevate hsTnI results.  Refer to the "Links" section for chest pain algorithms and additional  guidance. Performed at [facility], [address], [place], [place] [address]     Allergies: Fish allergy, Carrot oil, and Carrot [daucus carota]  PTA Medications: (Not in a hospital admission)   Medical Decision Making  ***    Recommendations  [facility] MSE Recommendations:[id number]}  Mancel Bale, NP 05/24/22  8:55 PM
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
## 2022-05-25 DIAGNOSIS — F1090 Alcohol use, unspecified, uncomplicated: Secondary | ICD-10-CM | POA: Diagnosis not present

## 2022-05-25 DIAGNOSIS — Z5900 Homelessness unspecified: Secondary | ICD-10-CM | POA: Diagnosis not present

## 2022-05-25 DIAGNOSIS — F250 Schizoaffective disorder, bipolar type: Secondary | ICD-10-CM | POA: Diagnosis not present

## 2022-05-25 DIAGNOSIS — R45851 Suicidal ideations: Secondary | ICD-10-CM

## 2022-05-25 DIAGNOSIS — F1094 Alcohol use, unspecified with alcohol-induced mood disorder: Secondary | ICD-10-CM | POA: Diagnosis not present

## 2022-05-25 DIAGNOSIS — F332 Major depressive disorder, recurrent severe without psychotic features: Secondary | ICD-10-CM

## 2022-05-25 LAB — POCT URINE DRUG SCREEN - MANUAL ENTRY (I-SCREEN)
POC Amphetamine UR: NOT DETECTED
POC Buprenorphine (BUP): NOT DETECTED
POC Cocaine UR: POSITIVE — AB
POC Marijuana UR: NOT DETECTED
POC Methadone UR: NOT DETECTED
POC Methamphetamine UR: NOT DETECTED
POC Morphine: NOT DETECTED
POC Oxazepam (BZO): POSITIVE — AB
POC Oxycodone UR: NOT DETECTED
POC Secobarbital (BAR): NOT DETECTED

## 2022-05-25 LAB — LIPID PANEL
Cholesterol: 141 mg/dL (ref 0–200)
HDL: 57 mg/dL (ref >40)
Total CHOL/HDL Ratio: 2.5 RATIO
Triglycerides: 94 mg/dL (ref <150)
VLDL: 19 mg/dL (ref 0–40)

## 2022-05-25 LAB — HEPATITIS PANEL, ACUTE
HCV Ab: REACTIVE — AB
Hep A IgM: NONREACTIVE
Hep B C IgM: NONREACTIVE
Hepatitis B Surface Ag: NONREACTIVE

## 2022-05-25 MED ORDER — CLONIDINE HCL 0.1 MG PO TABS
0.1000 mg | ORAL_TABLET | Freq: Every day | ORAL | Status: DC
  Administered 2022-05-25: 0.1 mg via ORAL
  Filled 2022-05-25: qty 1

## 2022-05-25 MED ORDER — ADULT MULTIVITAMIN W/MINERALS CH
1.0000 | ORAL_TABLET | Freq: Every day | ORAL | Status: DC
  Administered 2022-05-25: 1 via ORAL
  Filled 2022-05-25: qty 1

## 2022-05-25 MED ORDER — MELATONIN 5 MG PO TABS: 5.0000 mg | ORAL_TABLET | Freq: Every day | ORAL | Status: DC

## 2022-05-25 MED ORDER — ARIPIPRAZOLE 15 MG PO TABS
7.5000 mg | ORAL_TABLET | Freq: Every day | ORAL | Status: DC
  Administered 2022-05-25: 7.5 mg via ORAL
  Filled 2022-05-25: qty 1

## 2022-05-25 MED ORDER — POLYVINYL ALCOHOL 1.4 % OP SOLN
1.0000 [drp] | Freq: Once | OPHTHALMIC | Status: AC
  Administered 2022-05-25: 1 [drp] via OPHTHALMIC
  Filled 2022-05-25: qty 15

## 2022-05-25 NOTE — ED Notes (Signed)
Patient A&Ox4. Patient denies SI/HI and AVH. Patient complains of dry eyes. No acute distress noted. Support and encouragement provided. Routine safety checks conducted according to facility protocol. Encouraged patient to notify staff if thoughts of harm toward self or others arise. Patient verbalize understanding and agreement. Will continue to monitor for safety.

## 2022-05-25 NOTE — ED Notes (Signed)
Patient resting quietly in bed with eyes closed. Respirations equal and unlabored, skin warm and dry, NAD. No change in assessment or acuity. Routine safety checks conducted according to facility protocol. Will continue to monitor for safety.   

## 2022-05-25 NOTE — ED Notes (Signed)
Pt resting in bed. Respirations even and unlabored. Monitoring for safety. 

## 2022-05-25 NOTE — ED Notes (Signed)
Pt states, "I feel like my skin is crawling, I need some Benadryl or something." PRN Atarax administered without difficulty. Monitoring for safety.
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
## 2022-05-25 NOTE — ED Notes (Signed)
Pt was given a muffin and milk for breakfast.  
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
## 2022-05-25 NOTE — BH Assessment (Signed)
LCSW Progress Note   1104 - LCSW contacted [facility] to inquire about his last visit with them and if they have been working housing placement for them.  LCSW learned that pt was seen yesterday by the nurse or psychiatric provider on the ACT Team for his injections.  LCSW requested a message to be sent to his ACT Team that he will be discharged to the [facility] if anyone is able to meet with him there.  Per Carlyn Reichert, MD, this pt does not require psychiatric hospitalization at this time.  Pt is psychiatrically cleared.  Discharge instructions include instructions to follow up with his ACTT provider at [facility], contact information for [facility] and [facility], and other resources for aging adults.  EDP xxx and pt's nurse, xxx, have been notified.  [name], MSW, LCSW [facility] [phone] or [phone]
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
## 2022-05-25 NOTE — Discharge Instructions (Addendum)
Good afternoon, Mr. Slusher!  It was a pleasure to serve you at this time.  It is important that you follow-up with your ACTT services at [facility] no later than the end of this week to ask about being linked to the resources listed below if you have not been already.  I'm not sure if the goals in your Person-Centered Plan would involve these, but they could be a good supplement to support to what you are currently working on.  Especially if there is a chance at getting a Teacher, English as a foreign language.  See below for further details.   [facility] [place] [address] [place], [place], [address] [phone] phone Faythe Casa in 1977 as [place] [facility], the agency changed its name to [facility] on April 16, 2000.  [facility] [place] is the [employer] in [place] for a wide range of federal, state and locally funded programs which assist senior adults in maintaining their independence and continuing their involvement in community activities.  Our programs are open to all individuals who meet established program criteria regardless of race, color, creed or sex. The agency does not discriminate in its employment practices.  The agency is a 501(c)(3) non-profit organization eligible to receive tax deductible donations.   Publishing rights manager has offices in [place] and [place] and sponsors activities at locations throughout [place].  [employer]STA[employer] Educational psychologist in cooperation with a [employer] offer seniors the opportunity to enjoy a relaxed restaurant dining experience in a locally owned family restaurant while receiving a nutritious meal that supports healthy aging. The participant must be 60 years or older, be a resident of [facility], agree to attend a health and wellness/nutrition workshop at [employer] once per quarter in order to maintain eligibility, and  complete the reassessment process annually. Please call [facility] at [phone] (in [place]) and [phone] (in [facility]) for more information.  Contact the [facility] [phone]- [place] [phone] - [place] [email or web address] [address], [address] [place], [place], [address] [phone] phone  Many nonprofits in this sector focus on a single disability or a single aspect of a person's life. Here at [facility], we take a whole-person approach in less of a medical or judicial way than a traditional case worker. We work with the individual to determine all the factors of the social determinants of health that would improve overall livelihood and ability to live independently. Our goal is not just independency but a fulfilled life with the rights of success, enjoyment, and productivity deserved by every individual regardless of disability and any societal barriers.   Our five core services are: Information & Referrals, Systems & Individual Advocacy, Independent Living Skills Training, Institutional & Youth Transitions Support, and Peer Counseling. Contact us today to learn more.    [facility]  [employer] [facility] [address] [place], [place] [phone] ext. [phone] phone  [facility] [address] [place], [place], [address] [phone] phone [phone] phone  [address] [facility] [place] [address] [place], [place], [address] [phone] phone Documents required: Valid ID & another form of identification  [facility] [employer] [address] [place], [place] [address] [phone] Population Served: Families with children, adult women, and adult men.

## 2022-05-27 ENCOUNTER — Inpatient Hospital Stay: Payer: Medicare Other | Admitting: Internal Medicine

## 2022-05-29 ENCOUNTER — Other Ambulatory Visit: Payer: Self-pay

## 2022-05-29 ENCOUNTER — Encounter (HOSPITAL_COMMUNITY): Payer: Self-pay

## 2022-05-29 ENCOUNTER — Other Ambulatory Visit (HOSPITAL_COMMUNITY)
Admission: EM | Admit: 2022-05-29 | Discharge: 2022-06-01 | Disposition: A | Discharge status: Home / Self Care | Payer: Medicare Other | Attending: Psychiatry | Admitting: Psychiatry

## 2022-05-29 DIAGNOSIS — D509 Iron deficiency anemia, unspecified: Secondary | ICD-10-CM | POA: Diagnosis not present

## 2022-05-29 DIAGNOSIS — F250 Schizoaffective disorder, bipolar type: Secondary | ICD-10-CM | POA: Insufficient documentation

## 2022-05-29 DIAGNOSIS — F1090 Alcohol use, unspecified, uncomplicated: Secondary | ICD-10-CM | POA: Diagnosis present

## 2022-05-29 DIAGNOSIS — F1010 Alcohol abuse, uncomplicated: Secondary | ICD-10-CM | POA: Diagnosis not present

## 2022-05-29 DIAGNOSIS — Z9151 Personal history of suicidal behavior: Secondary | ICD-10-CM | POA: Diagnosis not present

## 2022-05-29 DIAGNOSIS — F1914 Other psychoactive substance abuse with psychoactive substance-induced mood disorder: Secondary | ICD-10-CM | POA: Diagnosis not present

## 2022-05-29 DIAGNOSIS — R45851 Suicidal ideations: Secondary | ICD-10-CM | POA: Diagnosis not present

## 2022-05-29 DIAGNOSIS — E079 Disorder of thyroid, unspecified: Secondary | ICD-10-CM | POA: Diagnosis not present

## 2022-05-29 DIAGNOSIS — F1994 Other psychoactive substance use, unspecified with psychoactive substance-induced mood disorder: Secondary | ICD-10-CM

## 2022-05-29 DIAGNOSIS — Z20822 Contact with and (suspected) exposure to covid-19: Secondary | ICD-10-CM | POA: Insufficient documentation

## 2022-05-29 DIAGNOSIS — F1910 Other psychoactive substance abuse, uncomplicated: Secondary | ICD-10-CM | POA: Diagnosis not present

## 2022-05-29 LAB — POCT URINE DRUG SCREEN - MANUAL ENTRY (I-SCREEN)
POC Amphetamine UR: NOT DETECTED
POC Buprenorphine (BUP): NOT DETECTED
POC Cocaine UR: NOT DETECTED
POC Marijuana UR: NOT DETECTED
POC Methadone UR: NOT DETECTED
POC Methamphetamine UR: NOT DETECTED
POC Morphine: NOT DETECTED
POC Oxazepam (BZO): NOT DETECTED
POC Oxycodone UR: NOT DETECTED
POC Secobarbital (BAR): NOT DETECTED

## 2022-05-29 LAB — COMPREHENSIVE METABOLIC PANEL
ALT: 23 U/L (ref 0–44)
AST: 30 U/L (ref 15–41)
Albumin: 3.8 g/dL (ref 3.5–5.0)
Alkaline Phosphatase: 65 U/L (ref 38–126)
Anion gap: 10 (ref 5–15)
BUN: 8 mg/dL (ref 8–23)
CO2: 19 mmol/L — ABNORMAL LOW (ref 22–32)
Calcium: 8.7 mg/dL — ABNORMAL LOW (ref 8.9–10.3)
Chloride: 105 mmol/L (ref 98–111)
Creatinine, Ser: 0.87 mg/dL (ref 0.61–1.24)
GFR, Estimated: >60 mL/min (ref >60)
Glucose, Bld: 84 mg/dL (ref 70–99)
Potassium: 3.8 mmol/L (ref 3.5–5.1)
Sodium: 134 mmol/L — ABNORMAL LOW (ref 135–145)
Total Bilirubin: 1.3 mg/dL — ABNORMAL HIGH (ref 0.3–1.2)
Total Protein: 7.8 g/dL (ref 6.5–8.1)

## 2022-05-29 LAB — CBC WITH DIFFERENTIAL/PLATELET
Abs Immature Granulocytes: 0.02 10*3/uL (ref 0.00–0.07)
Basophils Absolute: 0.1 10*3/uL (ref 0.0–0.1)
Basophils Relative: 1 %
Eosinophils Absolute: 0.3 10*3/uL (ref 0.0–0.5)
Eosinophils Relative: 4 %
HCT: 29.7 % — ABNORMAL LOW (ref 39.0–52.0)
Hemoglobin: 9 g/dL — ABNORMAL LOW (ref 13.0–17.0)
Immature Granulocytes: 0 %
Lymphocytes Relative: 33 %
Lymphs Abs: 2.3 10*3/uL (ref 0.7–4.0)
MCH: 20.7 pg — ABNORMAL LOW (ref 26.0–34.0)
MCHC: 30.3 g/dL (ref 30.0–36.0)
MCV: 68.3 fL — ABNORMAL LOW (ref 80.0–100.0)
Monocytes Absolute: 0.6 10*3/uL (ref 0.1–1.0)
Monocytes Relative: 9 %
Neutro Abs: 3.8 10*3/uL (ref 1.7–7.7)
Neutrophils Relative %: 53 %
Platelets: 148 10*3/uL — ABNORMAL LOW (ref 150–400)
RBC: 4.35 MIL/uL (ref 4.22–5.81)
RDW: 19 % — ABNORMAL HIGH (ref 11.5–15.5)
WBC: 7.2 10*3/uL (ref 4.0–10.5)
nRBC: 0 % (ref 0.0–0.2)

## 2022-05-29 LAB — RESP PANEL BY RT-PCR (FLU A&B, COVID) ARPGX2
Influenza A by PCR: NEGATIVE
Influenza B by PCR: NEGATIVE
SARS Coronavirus 2 by RT PCR: NEGATIVE

## 2022-05-29 LAB — POC SARS CORONAVIRUS 2 AG: SARSCOV2ONAVIRUS 2 AG: NEGATIVE

## 2022-05-29 LAB — ETHANOL: Alcohol, Ethyl (B): 194 mg/dL — ABNORMAL HIGH (ref <10)

## 2022-05-29 MED ORDER — THIAMINE HCL 100 MG PO TABS
100.0000 mg | ORAL_TABLET | Freq: Every day | ORAL | Status: DC
  Administered 2022-05-30: 100 mg via ORAL
  Filled 2022-05-29: qty 1

## 2022-05-29 MED ORDER — THIAMINE HCL 100 MG/ML IJ SOLN
100.0000 mg | Freq: Once | INTRAMUSCULAR | Status: AC
  Administered 2022-05-29: 100 mg via INTRAMUSCULAR
  Filled 2022-05-29: qty 2

## 2022-05-29 MED ORDER — GABAPENTIN 600 MG PO TABS
300.0000 mg | ORAL_TABLET | Freq: Three times a day (TID) | ORAL | Status: DC
  Administered 2022-05-29: 300 mg via ORAL
  Filled 2022-05-29 (×2): qty 1

## 2022-05-29 MED ORDER — LOPERAMIDE HCL 2 MG PO CAPS: 2.0000 mg | ORAL_CAPSULE | ORAL | Status: DC | PRN

## 2022-05-29 MED ORDER — ALUM & MAG HYDROXIDE-SIMETH 200-200-20 MG/5ML PO SUSP: 30.0000 mL | ORAL | Status: DC | PRN

## 2022-05-29 MED ORDER — ADULT MULTIVITAMIN W/MINERALS CH
1.0000 | ORAL_TABLET | Freq: Every day | ORAL | Status: DC
  Administered 2022-05-29 – 2022-05-30 (×2): 1 via ORAL
  Filled 2022-05-29 (×2): qty 1

## 2022-05-29 MED ORDER — TRAZODONE HCL 50 MG PO TABS
50.0000 mg | ORAL_TABLET | Freq: Every evening | ORAL | Status: DC | PRN
  Administered 2022-05-29: 50 mg via ORAL
  Filled 2022-05-29: qty 1

## 2022-05-29 MED ORDER — LORAZEPAM 1 MG PO TABS: 1.0000 mg | ORAL_TABLET | Freq: Four times a day (QID) | ORAL | Status: DC | PRN

## 2022-05-29 MED ORDER — ARIPIPRAZOLE 15 MG PO TABS
7.5000 mg | ORAL_TABLET | Freq: Every day | ORAL | Status: DC
  Administered 2022-05-29 – 2022-05-31 (×3): 7.5 mg via ORAL
  Filled 2022-05-29 (×3): qty 1

## 2022-05-29 MED ORDER — ONDANSETRON 4 MG PO TBDP: 4.0000 mg | ORAL_TABLET | Freq: Four times a day (QID) | ORAL | Status: DC | PRN

## 2022-05-29 MED ORDER — ACETAMINOPHEN 325 MG PO TABS: 650.0000 mg | ORAL_TABLET | Freq: Four times a day (QID) | ORAL | Status: DC | PRN

## 2022-05-29 MED ORDER — MAGNESIUM HYDROXIDE 400 MG/5ML PO SUSP: 30.0000 mL | Freq: Every day | ORAL | Status: DC | PRN

## 2022-05-29 MED ORDER — LACTULOSE ENCEPHALOPATHY 10 GM/15ML PO SOLN: 20.0000 g | Freq: Every day | ORAL | Status: DC | PRN

## 2022-05-29 MED ORDER — HYDROXYZINE HCL 25 MG PO TABS
25.0000 mg | ORAL_TABLET | Freq: Four times a day (QID) | ORAL | Status: DC | PRN
  Administered 2022-05-29 – 2022-06-01 (×2): 25 mg via ORAL
  Filled 2022-05-29 (×2): qty 1
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
## 2022-05-29 NOTE — ED Provider Notes (Signed)
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 Final   Basophils Absolute 05/10/2022 0.1  0.0 - 0.1 K/uL Final   Immature Granulocytes 05/10/2022 0  % Final   Abs Immature Granulocytes 05/10/2022 0.01  0.00 - 0.07 K/uL Final    Performed at [facility], [place] [address], [place], [place] [address]   Salicylate Lvl 56/38/9373 <7.0 (L)  7.0 - 30.0 mg/dL Final   Performed at [place] [address] [place], [place] [address]   Acetaminophen (Tylenol), Serum 05/10/2022 <10 (L)  10 - 30 ug/mL Final   Comment: (NOTE) Therapeutic concentrations vary significantly. A range of 10-30 ug/mL  may be an effective concentration for many patients. However, some  are best treated at concentrations outside of this range. Acetaminophen concentrations >150 ug/mL at 4 hours after ingestion  and >50 ug/mL at 12 hours after ingestion are often associated with  toxic reactions.  Performed at [facility], [place]  [address], [place], [place] [address]    Magnesium 05/10/2022 2.0  1.7 - 2.4 mg/dL Final   Performed at [place] [facility] [address], [place], [place] [address]   Color, Urine 05/10/2022 STRAW (A)  YELLOW Final   APPearance 05/10/2022 CLEAR  CLEAR Final   Specific Gravity, Urine 05/10/2022 1.005  1.005 - 1.030 Final   pH 05/10/2022 5.0  5.0 - 8.0 Final   Glucose, UA 05/10/2022 NEGATIVE  NEGATIVE mg/dL Final   Hgb urine dipstick 05/10/2022 NEGATIVE  NEGATIVE Final   Bilirubin Urine 05/10/2022 NEGATIVE  NEGATIVE Final   Ketones, ur 05/10/2022 NEGATIVE  NEGATIVE mg/dL Final   Protein, ur 05/10/2022 NEGATIVE  NEGATIVE mg/dL Final   Nitrite 05/10/2022 NEGATIVE  NEGATIVE Final   Leukocytes,Ua 05/10/2022 NEGATIVE  NEGATIVE Final   Performed at [facility] [address], [place], [place] [address]  Admission on 05/05/2022, Discharged on 05/06/2022  Component Date Value Ref Range Status   WBC 05/05/2022 7.3  4.0 - 10.5 K/uL Final   RBC 05/05/2022 3.68 (L)  4.22 - 5.81 MIL/uL Final   Hemoglobin 05/05/2022 8.2 (L)  13.0 - 17.0 g/dL Final   Comment: Reticulocyte Hemoglobin testing may be clinically indicated, consider ordering  this additional test [id number]    HCT 05/05/2022 27.1 (L)  39.0 - 52.0 % Final   MCV 05/05/2022 73.6 (L)  80.0 - 100.0 fL Final   MCH 05/05/2022 22.3 (L)  26.0 - 34.0 pg Final   MCHC 05/05/2022 30.3  30.0 - 36.0 g/dL Final   RDW 05/05/2022 19.6 (H)  11.5 - 15.5 % Final   Platelets 05/05/2022 234  150 - 400 K/uL Final   nRBC 05/05/2022 0.0  0.0 - 0.2 % Final   Neutrophils Relative % 05/05/2022 44  % Final   Neutro Abs 05/05/2022 3.2  1.7 - 7.7 K/uL Final   Lymphocytes Relative 05/05/2022 38  % Final   Lymphs Abs 05/05/2022 2.8  0.7 - 4.0 K/uL Final   Monocytes Relative 05/05/2022 13  % Final   Monocytes Absolute 05/05/2022 0.9  0.1 - 1.0 K/uL Final   Eosinophils Relative 05/05/2022 4  % Final   Eosinophils Absolute 05/05/2022 0.3  0.0 - 0.5 K/uL Final   Basophils Relative 05/05/2022 1  % Final   Basophils Absolute 05/05/2022 0.1  0.0 - 0.1 K/uL Final   Immature Granulocytes 05/05/2022 0  % Final   Abs Immature Granulocytes 05/05/2022 0.02  0.00 - 0.07 K/uL Final   Performed at [facility], [place] [address], [place], [place] [address]   Sodium 05/05/2022 138  135 - 145 mmol/L Final   Potassium 05/05/2022 3.8  3.5 - 5.1 mmol/L Final   Chloride 05/05/2022 109  98 - 111 mmol/L Final   CO2 05/05/2022 17 (L)  22 - 32 mmol/L Final   Glucose, Bld 05/05/2022 91  70 - 99 mg/dL Final   Glucose reference range applies only to samples taken after fasting for at least 8 hours.   BUN 05/05/2022 9  8 - 23 mg/dL Final   Creatinine, Ser 05/05/2022 0.87  0.61 - 1.24 mg/dL Final   Calcium 05/05/2022 8.6 (L)  8.9 - 10.3 mg/dL Final   Total Protein 05/05/2022 7.9  6.5 - 8.1 g/dL Final   Albumin 05/05/2022 3.3 (L)  3.5 - 5.0 g/dL Final   AST 05/05/2022 33  15 - 41 U/L Final   ALT 05/05/2022 23  0 - 44 U/L Final  Alkaline Phosphatase 05/05/2022 60  38 - 126 U/L Final   Total Bilirubin 05/05/2022 0.9  0.3 - 1.2 mg/dL Final   GFR, Estimated 05/05/2022 >60  >60 mL/min Final   Comment:  (NOTE) Calculated using the CKD-EPI Creatinine Equation (2021)    Anion gap 05/05/2022 12  5 - 15 Final   Performed at [facility] [address], [place], [place] [address]   Alcohol, Ethyl (B) 05/05/2022 169 (H)  <10 mg/dL Final   Comment: (NOTE) Lowest detectable limit for serum alcohol is 10 mg/dL.  For medical purposes only. Performed at [facility], [place] [address] [place], [place] [address]    Salicylate Lvl 09/81/1914 <7.0 (L)  7.0 - 30.0 mg/dL Final   Performed at [facility] [address] [place], [place] [address]   Acetaminophen (Tylenol), Serum 05/05/2022 <10 (L)  10 - 30 ug/mL Final   Comment: (NOTE) Therapeutic concentrations vary significantly. A range of 10-30 ug/mL  may be an effective concentration for many patients. However, some  are best treated at concentrations outside of this range. Acetaminophen concentrations >150 ug/mL at 4 hours after ingestion  and >50 ug/mL at 12 hours after ingestion are often associated with  toxic reactions.  Performed at [facility], [place] [address], [place], [place] [address]    Opiates 05/06/2022 NONE DETECTED  NONE DETECTED Final   Cocaine 05/06/2022 NONE DETECTED  NONE DETECTED Final   Benzodiazepines 05/06/2022 POSITIVE (A)  NONE DETECTED Final   Amphetamines 05/06/2022 NONE DETECTED  NONE DETECTED Final   Tetrahydrocannabinol 05/06/2022 NONE DETECTED  NONE DETECTED Final   Barbiturates 05/06/2022 NONE DETECTED  NONE DETECTED Final   Comment: (NOTE) DRUG SCREEN FOR MEDICAL PURPOSES ONLY.  IF CONFIRMATION IS NEEDED FOR ANY PURPOSE, NOTIFY LAB WITHIN 5 DAYS.  LOWEST DETECTABLE LIMITS FOR URINE DRUG SCREEN Drug Class                     Cutoff (ng/mL) Amphetamine and metabolites    1000 Barbiturate and metabolites    200 Benzodiazepine                 865 Tricyclics and metabolites     300 Opiates and metabolites        300 Cocaine and metabolites        300 THC                             50 Performed at [facility], [place] [address] [place], [place] [address]    SARS Coronavirus 2 by RT PCR 05/06/2022 NEGATIVE  NEGATIVE Final   Comment: (NOTE) SARS-CoV-2 target nucleic acids are NOT DETECTED.  The SARS-CoV-2 RNA is generally detectable in upper respiratory specimens during the acute phase of infection. The lowest concentration of SARS-CoV-2 viral copies this assay can detect is 138 copies/mL. A negative result does not preclude SARS-Cov-2 infection and should not be used as the sole basis for treatment or other patient management decisions. A negative result may occur with  improper specimen collection/handling, submission of specimen other than nasopharyngeal swab, presence of viral mutation(s) within the areas targeted by this assay, and inadequate number of viral copies(<138 copies/mL). A negative result must be combined with clinical observations, patient history, and epidemiological information. The expected result is Negative.  Fact Sheet for Patients:  EntrepreneurPulse.com.au  Fact Sheet for Healthcare Providers:  IncredibleEmployment.be  This test is no                          t yet approved or cleared by the [place] and  has been authorized for detection and/or diagnosis of SARS-CoV-2 by FDA under an Emergency Use Authorization (EUA). This EUA will remain  in effect (meaning this test can be used) for the duration of the COVID-19 declaration under Section 564(b)(1) of the Act, 21 U.S.C.section 360bbb-3(b)(1), unless the authorization is terminated  or revoked sooner.       Influenza A by PCR 05/06/2022 NEGATIVE  NEGATIVE Final   Influenza B by PCR 05/06/2022 NEGATIVE  NEGATIVE Final   Comment: (NOTE) The Xpert Xpress SARS-CoV-2/FLU/RSV plus assay is intended as an aid in the diagnosis of influenza from Nasopharyngeal swab specimens and should not be used as a sole basis for treatment. Nasal  washings and aspirates are unacceptable for Xpert Xpress SARS-CoV-2/FLU/RSV testing.  Fact Sheet for Patients: EntrepreneurPulse.com.au  Fact Sheet for Healthcare Providers: IncredibleEmployment.be  This test is not yet approved or cleared by the [place] FDA and has been authorized for detection and/or diagnosis of SARS-CoV-2 by FDA under an Emergency Use Authorization (EUA). This EUA will remain in effect (meaning this test can be used) for the duration of the COVID-19 declaration under Section 564(b)(1) of the Act, 21 U.S.C. section 360bbb-3(b)(1), unless the authorization is terminated or revoked.  Performed at [facility], [place] [address], [place], [place] [address]   Admission on 05/05/2022, Discharged on 05/05/2022  Component Date Value Ref Range Status   Sodium 05/05/2022 138  135 - 145 mmol/L Final   Potassium 05/05/2022 3.7  3.5 - 5.1 mmol/L Final   Chloride 05/05/2022 105  98 - 111 mmol/L Final   CO2 05/05/2022 19 (L)  22 - 32 mmol/L Final   Glucose, Bld 05/05/2022 91  70 - 99 mg/dL Final   Glucose reference range applies only to samples taken after fasting for at least 8 hours.   BUN 05/05/2022 11  8 - 23 mg/dL Final   Creatinine, Ser 05/05/2022 0.90  0.61 - 1.24 mg/dL Final   Calcium 05/05/2022 8.9  8.9 - 10.3 mg/dL Final   GFR, Estimated 05/05/2022 >60  >60 mL/min Final   Comment: (NOTE) Calculated using the CKD-EPI Creatinine Equation (2021)    Anion gap 05/05/2022 14  5 - 15 Final   Performed at [facility] [address], [place], [place] [address]   WBC 05/05/2022 8.0  4.0 - 10.5 K/uL Final   RBC 05/05/2022 3.78 (L)  4.22 - 5.81 MIL/uL Final   Hemoglobin 05/05/2022 8.4 (L)  13.0 - 17.0 g/dL Final   Comment: Reticulocyte Hemoglobin testing may be clinically indicated, consider ordering this additional test [id number]    HCT 05/05/2022 27.4 (L)  39.0 - 52.0 % Final   MCV 05/05/2022 72.5 (L)  80.0 -  100.0 fL Final   MCH 05/05/2022 22.2 (L)  26.0 - 34.0 pg Final   MCHC 05/05/2022 30.7  30.0 - 36.0 g/dL Final   RDW 05/05/2022 19.8 (H)  11.5 - 15.5 % Final   Platelets 05/05/2022 222  150 - 400 K/uL Final   nRBC 05/05/2022 0.0  0.0 - 0.2 % Final   Performed at [facility], [place] [address], [place], [place] [address]   Troponin I (High Sensitivity) 05/05/2022 10  <18 ng/L Final   Comment: (NOTE)  Elevated high sensitivity troponin I (hsTnI) values and significant  changes across serial measurements may suggest ACS but many other  chronic and acute conditions are known to elevate hsTnI results.  Refer to the "Links" section for chest pain algorithms and additional  guidance. Performed at [facility], [place] [address], [place], [place] [address]    Troponin I (High Sensitivity) 05/05/2022 7  <18 ng/L Final   Comment: (NOTE) Elevated high sensitivity troponin I (hsTnI) values and significant  changes across serial measurements may suggest ACS but many other  chronic and acute conditions are known to elevate hsTnI results.  Refer to the "Links" section for chest pain algorithms and additional  guidance. Performed at [facility], [place] [address] [place], [place] [address]   Admission on 04/20/2022, Discharged on 05/03/2022  Component Date Value Ref Range Status   Sodium 04/20/2022 135  135 - 145 mmol/L Final   Potassium 04/20/2022 4.1  3.5 - 5.1 mmol/L Final   Chloride 04/20/2022 100  98 - 111 mmol/L Final   CO2 04/20/2022 19 (L)  22 - 32 mmol/L Final   Glucose, Bld 04/20/2022 90  70 - 99 mg/dL Final   Glucose reference range applies only to samples taken after fasting for at least 8 hours.   BUN 04/20/2022 10  8 - 23 mg/dL Final   Creatinine, Ser 04/20/2022 1.06  0.61 - 1.24 mg/dL Final   Calcium 04/20/2022 8.8 (L)  8.9 - 10.3 mg/dL Final   Total Protein 04/20/2022 7.3  6.5 - 8.1 g/dL Final   Albumin 04/20/2022 3.4 (L)  3.5 - 5.0 g/dL Final   AST 04/20/2022 74 (H)   15 - 41 U/L Final   ALT 04/20/2022 41  0 - 44 U/L Final   Alkaline Phosphatase 04/20/2022 69  38 - 126 U/L Final   Total Bilirubin 04/20/2022 1.4 (H)  0.3 - 1.2 mg/dL Final   GFR, Estimated 04/20/2022 >60  >60 mL/min Final   Comment: (NOTE) Calculated using the CKD-EPI Creatinine Equation (2021)    Anion gap 04/20/2022 16 (H)  5 - 15 Final   Performed at [facility], [place] [address] [place], [place] [address]   Sodium 04/20/2022 135  135 - 145 mmol/L Final   Potassium 04/20/2022 4.2  3.5 - 5.1 mmol/L Final   Chloride 04/20/2022 103  98 - 111 mmol/L Final   BUN 04/20/2022 10  8 - 23 mg/dL Final   Creatinine, Ser 04/20/2022 1.30 (H)  0.61 - 1.24 mg/dL Final   Glucose, Bld 04/20/2022 93  70 - 99 mg/dL Final   Glucose reference range applies only to samples taken after fasting for at least 8 hours.   Calcium, Ion 04/20/2022 0.88 (LL)  1.15 - 1.40 mmol/L Final   TCO2 04/20/2022 21 (L)  22 - 32 mmol/L Final   Hemoglobin 04/20/2022 12.9 (L)  13.0 - 17.0 g/dL Final   HCT 04/20/2022 38.0 (L)  39.0 - 52.0 % Final   Comment 04/20/2022 NOTIFIED PHYSICIAN   Final   WBC 04/20/2022 6.9  4.0 - 10.5 K/uL Final   RBC 04/20/2022 4.91  4.22 - 5.81 MIL/uL Final   Hemoglobin 04/20/2022 11.1 (L)  13.0 - 17.0 g/dL Final   HCT 04/20/2022 38.0 (L)  39.0 - 52.0 % Final   MCV 04/20/2022 77.4 (L)  80.0 - 100.0 fL Final   MCH 04/20/2022 22.6 (L)  26.0 - 34.0 pg Final   MCHC 04/20/2022 29.2 (L)  30.0 - 36.0 g/dL Final   RDW 04/20/2022 21.0 (H)  11.5 - 15.5 % Final   Platelets 04/20/2022 139 (L)  150 - 400 K/uL Final   nRBC 04/20/2022 0.0  0.0 - 0.2 % Final   Performed at [facility], [place] [address], [place], [place] [address]   Alcohol, Ethyl (B) 04/20/2022 262 (H)  <10 mg/dL Final   Comment: (NOTE) Lowest detectable limit for serum alcohol is 10 mg/dL.  For medical purposes only. Performed at [facility], [place] [address], [place], [place] [address]    Color, Urine 04/20/2022 YELLOW   YELLOW Final   APPearance 04/20/2022 CLEAR  CLEAR Final   Specific Gravity, Urine 04/20/2022 1.008  1.005 - 1.030 Final   pH 04/20/2022 7.0  5.0 - 8.0 Final   Glucose, UA 04/20/2022 NEGATIVE  NEGATIVE mg/dL Final   Hgb urine dipstick 04/20/2022 NEGATIVE  NEGATIVE Final   Bilirubin Urine 04/20/2022 NEGATIVE  NEGATIVE Final   Ketones, ur 04/20/2022 NEGATIVE  NEGATIVE mg/dL Final   Protein, ur 04/20/2022 NEGATIVE  NEGATIVE mg/dL Final   Nitrite 04/20/2022 NEGATIVE  NEGATIVE Final   Leukocytes,Ua 04/20/2022 NEGATIVE  NEGATIVE Final   Performed at [facility], [place] [address], [place], [place] [address]   Lactic Acid, Venous 04/20/2022 3.2 (HH)  0.5 - 1.9 mmol/L Final   Comment: CRITICAL RESULT CALLED TO, READ BACK BY AND VERIFIED WITH: M.RUGGIERO,RN _0  04/20/2022 [email or web address] Performed at [facility], [place] [address] [place], [place] [address]    Prothrombin Time 04/20/2022 14.1  11.4 - 15.2 seconds Final   INR 04/20/2022 1.1  0.8 - 1.2 Final   Comment: (NOTE) INR goal varies based on device and disease states. Performed at [facility], [place] [address], [place], [place] [address]    Blood Bank Specimen 04/20/2022 SAMPLE AVAILABLE FOR TESTING   Final   Sample Expiration 04/20/2022    Final                   Value:04/21/2022,2359 Performed at [facility] [address], [place], [place] [address]    Sodium 04/21/2022 141  135 - 145 mmol/L Final   Potassium 04/21/2022 4.1  3.5 - 5.1 mmol/L Final   Chloride 04/21/2022 102  98 - 111 mmol/L Final   CO2 04/21/2022 25  22 - 32 mmol/L Final   Glucose, Bld 04/21/2022 104 (H)  70 - 99 mg/dL Final   Glucose reference range applies only to samples taken after fasting for at least 8 hours.   BUN 04/21/2022 12  8 - 23 mg/dL Final   Creatinine, Ser 04/21/2022 0.97  0.61 - 1.24 mg/dL Final   Calcium 04/21/2022 9.1  8.9 - 10.3 mg/dL Final   GFR, Estimated 04/21/2022 >60  >60 mL/min Final   Comment: (NOTE) Calculated using  the CKD-EPI Creatinine Equation (2021)    Anion gap 04/21/2022 14  5 - 15 Final   Performed at [facility], [place] [address] [place], [place] [address]   WBC 04/21/2022 5.8  4.0 - 10.5 K/uL Final   RBC 04/21/2022 3.94 (L)  4.22 - 5.81 MIL/uL Final   Hemoglobin 04/21/2022 9.1 (L)  13.0 - 17.0 g/dL Final   HCT 04/21/2022 29.3 (L)  39.0 - 52.0 % Final   MCV 04/21/2022 74.4 (L)  80.0 - 100.0 fL Final   MCH 04/21/2022 23.1 (L)  26.0 - 34.0 pg Final   MCHC 04/21/2022  31.1  30.0 - 36.0 g/dL Final   RDW 04/21/2022 20.3 (H)  11.5 - 15.5 % Final   Platelets 04/21/2022 99 (L)  150 - 400 K/uL Final   Comment: Immature Platelet Fraction may be clinically indicated, consider ordering this additional test [id number] REPEATED TO VERIFY PLATELET COUNT CONFIRMED BY SMEAR    nRBC 04/21/2022 0.0  0.0 - 0.2 % Final   Performed at [facility], [place] [address], [place], [place] [address]   HIV Screen 4th Generation wRfx 04/21/2022 Non Reactive  Non Reactive Final   Performed at [facility], [place] [address] [place], [place] [address]   Sodium 04/22/2022 138  135 - 145 mmol/L Final   Potassium 04/22/2022 3.7  3.5 - 5.1 mmol/L Final   Chloride 04/22/2022 103  98 - 111 mmol/L Final   CO2 04/22/2022 26  22 - 32 mmol/L Final   Glucose, Bld 04/22/2022 124 (H)  70 - 99 mg/dL Final   Glucose reference range applies only to samples taken after fasting for at least 8 hours.   BUN 04/22/2022 9  8 - 23 mg/dL Final   Creatinine, Ser 04/22/2022 0.90  0.61 - 1.24 mg/dL Final   Calcium 04/22/2022 8.6 (L)  8.9 - 10.3 mg/dL Final   GFR, Estimated 04/22/2022 >60  >60 mL/min Final   Comment: (NOTE) Calculated using the CKD-EPI Creatinine Equation (2021)    Anion gap 04/22/2022 9  5 - 15 Final   Performed at [facility], [place] [address] [place], [place] [address]   WBC 04/22/2022 3.6 (L)  4.0 - 10.5 K/uL Final   RBC 04/22/2022 3.55 (L)  4.22 - 5.81 MIL/uL Final   Hemoglobin 04/22/2022 8.2 (L)   13.0 - 17.0 g/dL Final   Comment: Reticulocyte Hemoglobin testing may be clinically indicated, consider ordering this additional test [id number]    HCT 04/22/2022 26.6 (L)  39.0 - 52.0 % Final   MCV 04/22/2022 74.9 (L)  80.0 - 100.0 fL Final   MCH 04/22/2022 23.1 (L)  26.0 - 34.0 pg Final   MCHC 04/22/2022 30.8  30.0 - 36.0 g/dL Final   RDW 04/22/2022 20.5 (H)  11.5 - 15.5 % Final   Platelets 04/22/2022 73 (L)  150 - 400 K/uL Final   Comment: Immature Platelet Fraction may be clinically indicated, consider ordering this additional test [id number] REPEATED TO VERIFY    nRBC 04/22/2022 0.0  0.0 - 0.2 % Final   Performed at [facility], [place] [address] [place], [place] [address]   WBC 04/23/2022 3.7 (L)  4.0 - 10.5 K/uL Final   RBC 04/23/2022 3.61 (L)  4.22 - 5.81 MIL/uL Final   Hemoglobin 04/23/2022 8.4 (L)  13.0 - 17.0 g/dL Final   Comment: Reticulocyte Hemoglobin testing may be clinically indicated, consider ordering this additional test [id number]    HCT 04/23/2022 27.0 (L)  39.0 - 52.0 % Final   MCV 04/23/2022 74.8 (L)  80.0 - 100.0 fL Final   MCH 04/23/2022 23.3 (L)  26.0 - 34.0 pg Final   MCHC 04/23/2022 31.1  30.0 - 36.0 g/dL Final   RDW 04/23/2022 20.9 (H)  11.5 - 15.5 % Final   Platelets 04/23/2022 73 (L)  150 - 400 K/uL Final   Comment: Immature Platelet Fraction may be clinically indicated, consider ordering this additional test [id number] CONSISTENT WITH PREVIOUS RESULT REPEATED TO VERIFY    nRBC 04/23/2022 0.0  0.0 - 0.2 % Final  Performed at [facility], [place] [address] [place], [place] [address]   WBC 04/26/2022 3.8 (L)  4.0 - 10.5 K/uL Final   RBC 04/26/2022 3.34 (L)  4.22 - 5.81 MIL/uL Final   Hemoglobin 04/26/2022 7.7 (L)  13.0 - 17.0 g/dL Final   Comment: Reticulocyte Hemoglobin testing may be clinically indicated, consider ordering this additional test [id number]    HCT 04/26/2022 25.1 (L)  39.0 - 52.0 % Final   MCV 04/26/2022 75.1 (L)  80.0  - 100.0 fL Final   MCH 04/26/2022 23.1 (L)  26.0 - 34.0 pg Final   MCHC 04/26/2022 30.7  30.0 - 36.0 g/dL Final   RDW 04/26/2022 20.3 (H)  11.5 - 15.5 % Final   Platelets 04/26/2022 98 (L)  150 - 400 K/uL Final   REPEATED TO VERIFY   nRBC 04/26/2022 0.0  0.0 - 0.2 % Final   Performed at [facility], [place] [address] [place], [place] [address]   Sodium 04/26/2022 140  135 - 145 mmol/L Final   Potassium 04/26/2022 4.5  3.5 - 5.1 mmol/L Final   Chloride 04/26/2022 105  98 - 111 mmol/L Final   CO2 04/26/2022 28  22 - 32 mmol/L Final   Glucose, Bld 04/26/2022 101 (H)  70 - 99 mg/dL Final   Glucose reference range applies only to samples taken after fasting for at least 8 hours.   BUN 04/26/2022 12  8 - 23 mg/dL Final   Creatinine, Ser 04/26/2022 1.04  0.61 - 1.24 mg/dL Final   Calcium 04/26/2022 8.8 (L)  8.9 - 10.3 mg/dL Final   GFR, Estimated 04/26/2022 >60  >60 mL/min Final   Comment: (NOTE) Calculated using the CKD-EPI Creatinine Equation (2021)    Anion gap 04/26/2022 7  5 - 15 Final   Performed at [facility], [place] [address], [place], [place] [address]   WBC 04/27/2022 3.8 (L)  4.0 - 10.5 K/uL Final   RBC 04/27/2022 3.59 (L)  4.22 - 5.81 MIL/uL Final   Hemoglobin 04/27/2022 8.2 (L)  13.0 - 17.0 g/dL Final   Comment: Reticulocyte Hemoglobin testing may be clinically indicated, consider ordering this additional test [id number]    HCT 04/27/2022 27.1 (L)  39.0 - 52.0 % Final   MCV 04/27/2022 75.5 (L)  80.0 - 100.0 fL Final   MCH 04/27/2022 22.8 (L)  26.0 - 34.0 pg Final   MCHC 04/27/2022 30.3  30.0 - 36.0 g/dL Final   RDW 04/27/2022 20.1 (H)  11.5 - 15.5 % Final   Platelets 04/27/2022 114 (L)  150 - 400 K/uL Final   REPEATED TO VERIFY   nRBC 04/27/2022 0.0  0.0 - 0.2 % Final   Performed at [facility], [place] [address] [place], [place] [address]   Sodium 04/27/2022 139  135 - 145 mmol/L Final   Potassium 04/27/2022 4.2  3.5 - 5.1 mmol/L Final   Chloride  04/27/2022 108  98 - 111 mmol/L Final   CO2 04/27/2022 25  22 - 32 mmol/L Final   Glucose, Bld 04/27/2022 117 (H)  70 - 99 mg/dL Final   Glucose reference range applies only to samples taken after fasting for at least 8 hours.   BUN 04/27/2022 9  8 - 23 mg/dL Final   Creatinine, Ser 04/27/2022 1.16  0.61 - 1.24 mg/dL Final   Calcium 04/27/2022 8.7 (L)  8.9 - 10.3 mg/dL Final   GFR, Estimated 04/27/2022 >60  >60 mL/min  Final   Comment: (NOTE) Calculated using the CKD-EPI Creatinine Equation (2021)    Anion gap 04/27/2022 6  5 - 15 Final   Performed at [facility], [place] [address], [place], [place] [address]   WBC 04/28/2022 4.5  4.0 - 10.5 K/uL Final   RBC 04/28/2022 3.39 (L)  4.22 - 5.81 MIL/uL Final   Hemoglobin 04/28/2022 7.7 (L)  13.0 - 17.0 g/dL Final   Comment: Reticulocyte Hemoglobin testing may be clinically indicated, consider ordering this additional test [id number]    HCT 04/28/2022 25.4 (L)  39.0 - 52.0 % Final   MCV 04/28/2022 74.9 (L)  80.0 - 100.0 fL Final   MCH 04/28/2022 22.7 (L)  26.0 - 34.0 pg Final   MCHC 04/28/2022 30.3  30.0 - 36.0 g/dL Final   RDW 04/28/2022 19.9 (H)  11.5 - 15.5 % Final   Platelets 04/28/2022 125 (L)  150 - 400 K/uL Final   REPEATED TO VERIFY   nRBC 04/28/2022 0.0  0.0 - 0.2 % Final   Performed at [facility], [place] [address] [place], [place] [address]   WBC 04/29/2022 4.3  4.0 - 10.5 K/uL Final   RBC 04/29/2022 3.39 (L)  4.22 - 5.81 MIL/uL Final   Hemoglobin 04/29/2022 7.8 (L)  13.0 - 17.0 g/dL Final   Comment: Reticulocyte Hemoglobin testing may be clinically indicated, consider ordering this additional test [id number]    HCT 04/29/2022 25.1 (L)  39.0 - 52.0 % Final   MCV 04/29/2022 74.0 (L)  80.0 - 100.0 fL Final   MCH 04/29/2022 23.0 (L)  26.0 - 34.0 pg Final   MCHC 04/29/2022 31.1  30.0 - 36.0 g/dL Final   RDW 04/29/2022 19.9 (H)  11.5 - 15.5 % Final   Platelets 04/29/2022 125 (L)  150 - 400 K/uL Final   REPEATED TO  VERIFY   nRBC 04/29/2022 0.0  0.0 - 0.2 % Final   Performed at [facility], [place] [address] [place], [place] [address]   WBC 05/01/2022 3.6 (L)  4.0 - 10.5 K/uL Final   RBC 05/01/2022 3.13 (L)  4.22 - 5.81 MIL/uL Final   Hemoglobin 05/01/2022 7.1 (L)  13.0 - 17.0 g/dL Final   Comment: Reticulocyte Hemoglobin testing may be clinically indicated, consider ordering this additional test [id number]    HCT 05/01/2022 23.0 (L)  39.0 - 52.0 % Final   MCV 05/01/2022 73.5 (L)  80.0 - 100.0 fL Final   MCH 05/01/2022 22.7 (L)  26.0 - 34.0 pg Final   MCHC 05/01/2022 30.9  30.0 - 36.0 g/dL Final   RDW 05/01/2022 19.8 (H)  11.5 - 15.5 % Final   Platelets 05/01/2022 149 (L)  150 - 400 K/uL Final   REPEATED TO VERIFY   nRBC 05/01/2022 0.0  0.0 - 0.2 % Final   Performed at [facility], [place] [address] [place], [place] [address]   Sodium 05/01/2022 137  135 - 145 mmol/L Final   Potassium 05/01/2022 4.0  3.5 - 5.1 mmol/L Final   Chloride 05/01/2022 109  98 - 111 mmol/L Final   CO2 05/01/2022 23  22 - 32 mmol/L Final   Glucose, Bld 05/01/2022 98  70 - 99 mg/dL Final   Glucose reference range applies only to samples taken after fasting for at least 8 hours.   BUN 05/01/2022 14  8 - 23 mg/dL Final   Creatinine, Ser 05/01/2022 0.83  0.61 - 1.24 mg/dL Final  Calcium 05/01/2022 8.4 (L)  8.9 - 10.3 mg/dL Final   GFR, Estimated 05/01/2022 >60  >60 mL/min Final   Comment: (NOTE) Calculated using the CKD-EPI Creatinine Equation (2021)    Anion gap 05/01/2022 5  5 - 15 Final   Performed at [place] [address], [place], [place] [address]   WBC 05/02/2022 7.8  4.0 - 10.5 K/uL Final   RBC 05/02/2022 3.23 (L)  4.22 - 5.81 MIL/uL Final   Hemoglobin 05/02/2022 7.2 (L)  13.0 - 17.0 g/dL Final   Comment: Reticulocyte Hemoglobin testing may be clinically indicated, consider ordering this additional test [id number]    HCT 05/02/2022 23.6 (L)  39.0 - 52.0 % Final   MCV 05/02/2022 73.1 (L)   80.0 - 100.0 fL Final   MCH 05/02/2022 22.3 (L)  26.0 - 34.0 pg Final   MCHC 05/02/2022 30.5  30.0 - 36.0 g/dL Final   RDW 05/02/2022 19.5 (H)  11.5 - 15.5 % Final   Platelets 05/02/2022 153  150 - 400 K/uL Final   REPEATED TO VERIFY   nRBC 05/02/2022 0.0  0.0 - 0.2 % Final   Performed at [facility], [place] [address] [place], [place] [address]  Admission on 03/31/2022, Discharged on 04/02/2022  Component Date Value Ref Range Status   Sodium 03/31/2022 140  135 - 145 mmol/L Final   Potassium 03/31/2022 3.6  3.5 - 5.1 mmol/L Final   Chloride 03/31/2022 108  98 - 111 mmol/L Final   CO2 03/31/2022 19 (L)  22 - 32 mmol/L Final   Glucose, Bld 03/31/2022 84  70 - 99 mg/dL Final   Glucose reference range applies only to samples taken after fasting for at least 8 hours.   BUN 03/31/2022 9  8 - 23 mg/dL Final   Creatinine, Ser 03/31/2022 0.86  0.61 - 1.24 mg/dL Final   Calcium 03/31/2022 8.6 (L)  8.9 - 10.3 mg/dL Final   Total Protein 03/31/2022 7.2  6.5 - 8.1 g/dL Final   Albumin 03/31/2022 3.5  3.5 - 5.0 g/dL Final   AST 03/31/2022 56 (H)  15 - 41 U/L Final   ALT 03/31/2022 35  0 - 44 U/L Final   Alkaline Phosphatase 03/31/2022 61  38 - 126 U/L Final   Total Bilirubin 03/31/2022 2.0 (H)  0.3 - 1.2 mg/dL Final   GFR, Estimated 03/31/2022 >60  >60 mL/min Final   Comment: (NOTE) Calculated using the CKD-EPI Creatinine Equation (2021)    Anion gap 03/31/2022 13  5 - 15 Final   Performed at [facility], [place] [address] [place], [place] [address]   Alcohol, Ethyl (B) 03/31/2022 203 (H)  <10 mg/dL Final   Comment: (NOTE) Lowest detectable limit for serum alcohol is 10 mg/dL.  For medical purposes only. Performed at [facility], [place] [address], [place], [place] [address]    Salicylate Lvl 86/57/8469 <7.0 (L)  7.0 - 30.0 mg/dL Final   Performed at [place] [address] [place], [place] [address]   Acetaminophen (Tylenol), Serum 03/31/2022 <10 (L)  10 - 30  ug/mL Final   Comment: (NOTE) Therapeutic concentrations vary significantly. A range of 10-30 ug/mL  may be an effective concentration for many patients. However, some  are best treated at concentrations outside of this range. Acetaminophen concentrations >150 ug/mL at 4 hours after ingestion  and >50 ug/mL at 12 hours after ingestion are often associated with  toxic reactions.  Performed at [facility], [place] [address] [place], [place] [address]    WBC 03/31/2022 4.4  4.0 - 10.5 K/uL Final   RBC 03/31/2022 5.10  4.22 - 5.81 MIL/uL Final   Hemoglobin 03/31/2022 11.8 (L)  13.0 - 17.0 g/dL Final   HCT 03/31/2022 38.5 (L)  39.0 - 52.0 % Final   MCV 03/31/2022 75.5 (L)  80.0 - 100.0 fL Final   MCH 03/31/2022 23.1 (L)  26.0 - 34.0 pg Final   MCHC 03/31/2022 30.6  30.0 - 36.0 g/dL Final   RDW 03/31/2022 19.4 (H)  11.5 - 15.5 % Final   Platelets 03/31/2022 102 (L)  150 - 400 K/uL Final   Comment: Immature Platelet Fraction may be clinically indicated, consider ordering this additional test [id number] REPEATED TO VERIFY PLATELET COUNT CONFIRMED BY SMEAR    nRBC 03/31/2022 0.0  0.0 - 0.2 % Final   Performed at [facility], [place] [address] [place], [place] [address]   Opiates 03/31/2022 NONE DETECTED  NONE DETECTED Final   Cocaine 03/31/2022 NONE DETECTED  NONE DETECTED Final   Benzodiazepines 03/31/2022 NONE DETECTED  NONE DETECTED Final   Amphetamines 03/31/2022 NONE DETECTED  NONE DETECTED Final   Tetrahydrocannabinol 03/31/2022 NONE DETECTED  NONE DETECTED Final   Barbiturates 03/31/2022 NONE DETECTED  NONE DETECTED Final   Comment: (NOTE) DRUG SCREEN FOR MEDICAL PURPOSES ONLY.  IF CONFIRMATION IS NEEDED FOR ANY PURPOSE, NOTIFY LAB WITHIN 5 DAYS.  LOWEST DETECTABLE LIMITS FOR URINE DRUG SCREEN Drug Class                     Cutoff (ng/mL) Amphetamine and metabolites    1000 Barbiturate and metabolites    200 Benzodiazepine                 102 Tricyclics and  metabolites     300 Opiates and metabolites        300 Cocaine and metabolites        300 THC                            50 Performed at [facility], [place] [address] [place], [place] [address]    SARS Coronavirus 2 by RT PCR 03/31/2022 NEGATIVE  NEGATIVE Final   Comment: (NOTE) SARS-CoV-2 target nucleic acids are NOT DETECTED.  The SARS-CoV-2 RNA is generally detectable in upper and lower respiratory specimens during the acute phase of infection. The lowest concentration of SARS-CoV-2 viral copies this assay can detect is 250 copies / mL. A negative result does not preclude SARS-CoV-2 infection and should not be used as the sole basis for treatment or other patient management decisions.  A negative result may occur with improper specimen collection / handling, submission of specimen other than nasopharyngeal swab, presence of viral mutation(s) within the areas targeted by this assay, and inadequate number of viral copies (<250 copies / mL). A negative result must be combined with clinical observations, patient history, and epidemiological information.  Fact Sheet for Patients:   https://www.patel.info/  Fact Sheet for Healthcare Providers: https://hall.com/  This test is not yet approved or                           cleared by the [place] FDA and has been authorized for detection and/or diagnosis of SARS-CoV-2 by FDA under an Emergency Use Authorization (  EUA).  This EUA will remain in effect (meaning this test can be used) for the duration of the COVID-19 declaration under Section 564(b)(1) of the Act, 21 U.S.C. section 360bbb-3(b)(1), unless the authorization is terminated or revoked sooner.  Performed at [facility], [place] [address], [place], [place] [address]   Admission on 01/22/2022, Discharged on 01/22/2022  Component Date Value Ref Range Status   Lipase 01/22/2022 44  11 - 51 U/L Final   Performed at  [facility], [place] [address] [place], [place] [address]   Sodium 01/22/2022 138  135 - 145 mmol/L Final   Potassium 01/22/2022 3.4 (L)  3.5 - 5.1 mmol/L Final   Chloride 01/22/2022 105  98 - 111 mmol/L Final   CO2 01/22/2022 24  22 - 32 mmol/L Final   Glucose, Bld 01/22/2022 90  70 - 99 mg/dL Final   Glucose reference range applies only to samples taken after fasting for at least 8 hours.   BUN 01/22/2022 10  8 - 23 mg/dL Final   Creatinine, Ser 01/22/2022 0.87  0.61 - 1.24 mg/dL Final   Calcium 01/22/2022 8.9  8.9 - 10.3 mg/dL Final   Total Protein 01/22/2022 7.9  6.5 - 8.1 g/dL Final   Albumin 01/22/2022 3.7  3.5 - 5.0 g/dL Final   AST 01/22/2022 43 (H)  15 - 41 U/L Final   ALT 01/22/2022 31  0 - 44 U/L Final   Alkaline Phosphatase 01/22/2022 74  38 - 126 U/L Final   Total Bilirubin 01/22/2022 2.0 (H)  0.3 - 1.2 mg/dL Final   GFR, Estimated 01/22/2022 >60  >60 mL/min Final   Comment: (NOTE) Calculated using the CKD-EPI Creatinine Equation (2021)    Anion gap 01/22/2022 9  5 - 15 Final   Performed at [facility], [place] [address] [place], [place] [address]   WBC 01/22/2022 4.7  4.0 - 10.5 K/uL Final   RBC 01/22/2022 5.84 (H)  4.22 - 5.81 MIL/uL Final   Hemoglobin 01/22/2022 13.1  13.0 - 17.0 g/dL Final   HCT 01/22/2022 43.9  39.0 - 52.0 % Final   MCV 01/22/2022 75.2 (L)  80.0 - 100.0 fL Final   MCH 01/22/2022 22.4 (L)  26.0 - 34.0 pg Final   MCHC 01/22/2022 29.8 (L)  30.0 - 36.0 g/dL Final   RDW 01/22/2022 19.7 (H)  11.5 - 15.5 % Final   Platelets 01/22/2022 128 (L)  150 - 400 K/uL Final   nRBC 01/22/2022 0.0  0.0 - 0.2 % Final   Performed at [facility], [place] [address], [place], [place] [address]   Color, Urine 01/22/2022 STRAW (A)  YELLOW Final   APPearance 01/22/2022 CLEAR  CLEAR Final   Specific Gravity, Urine 01/22/2022 1.002 (L)  1.005 - 1.030 Final   pH 01/22/2022 5.0  5.0 - 8.0 Final   Glucose, UA 01/22/2022  NEGATIVE  NEGATIVE mg/dL Final   Hgb urine dipstick 01/22/2022 NEGATIVE  NEGATIVE Final   Bilirubin Urine 01/22/2022 NEGATIVE  NEGATIVE Final   Ketones, ur 01/22/2022 NEGATIVE  NEGATIVE mg/dL Final   Protein, ur 01/22/2022 NEGATIVE  NEGATIVE mg/dL Final   Nitrite 01/22/2022 NEGATIVE  NEGATIVE Final   Leukocytes,Ua 01/22/2022 NEGATIVE  NEGATIVE Final   Performed at [place] [address], [place], [place] [address]   Opiates 01/22/2022 NONE DETECTED  NONE DETECTED Final   Cocaine 01/22/2022 NONE DETECTED  NONE DETECTED Final   Benzodiazepines 01/22/2022 NONE  DETECTED  NONE DETECTED Final   Amphetamines 01/22/2022 NONE DETECTED  NONE DETECTED Final   Tetrahydrocannabinol 01/22/2022 NONE DETECTED  NONE DETECTED Final   Barbiturates 01/22/2022 NONE DETECTED  NONE DETECTED Final   Comment: (NOTE) DRUG SCREEN FOR MEDICAL PURPOSES ONLY.  IF CONFIRMATION IS NEEDED FOR ANY PURPOSE, NOTIFY LAB WITHIN 5 DAYS.  LOWEST DETECTABLE LIMITS FOR URINE DRUG SCREEN Drug Class                     Cutoff (ng/mL) Amphetamine and metabolites    1000 Barbiturate and metabolites    200 Benzodiazepine                 357 Tricyclics and metabolites     300 Opiates and metabolites        300 Cocaine and metabolites        300 THC                            50 Performed at [facility], [place] [address] [place], [place] [address]    Alcohol, Ethyl (B) 01/22/2022 134 (H)  <10 mg/dL Final   Comment: (NOTE) Lowest detectable limit for serum alcohol is 10 mg/dL.  For medical purposes only. Performed at [facility], [place] [address], [place], [place] [address]    Specimen Description 01/22/2022    Final                   Value:URINE, CLEAN CATCH Performed at [facility], [place] [address], [place], [place] [address]    Special Requests 01/22/2022    Final                   Value:NONE Performed at [facility],  [place] [address], [place], [place] [address]    Culture 01/22/2022    Final                   Value:NO GROWTH Performed at [facility], [place] [address], [place], [place] [address]    Report Status 01/22/2022 01/23/2022 FINAL   Final   Ammonia 01/22/2022 18  9 - 35 umol/L Final   Performed at [facility], [place] [address] [place], [place] [address]   Acetaminophen (Tylenol), Serum 01/22/2022 <10 (L)  10 - 30 ug/mL Final   Comment: (NOTE) Therapeutic concentrations vary significantly. A range of 10-30 ug/mL  may be an effective concentration for many patients. However, some  are best treated at concentrations outside of this range. Acetaminophen concentrations >150 ug/mL at 4 hours after ingestion  and >50 ug/mL at 12 hours after ingestion are often associated with  toxic reactions.  Performed at [facility], [place] [address], [place], [place] [address]    Salicylate Lvl 37/34/2876 <7.0 (L)  7.0 - 30.0 mg/dL Final   Performed at [place] [address], [place], [place] [address]  Admission on 01/10/2022, Discharged on 01/10/2022  Component Date Value Ref Range Status   Sodium 01/10/2022 131 (L)  135 - 145 mmol/L Final   Potassium 01/10/2022 4.0  3.5 - 5.1 mmol/L Final   Chloride 01/10/2022 98  98 - 111 mmol/L Final   CO2 01/10/2022 23  22 - 32 mmol/L Final   Glucose, Bld 01/10/2022 84  70 - 99 mg/dL Final   Glucose reference range applies only to samples  taken after fasting for at least 8 hours.   BUN 01/10/2022 8  8 - 23 mg/dL Final   Creatinine, Ser 01/10/2022 0.76  0.61 - 1.24 mg/dL Final   Calcium 01/10/2022 9.1  8.9 - 10.3 mg/dL Final   Total Protein 01/10/2022 7.8  6.5 - 8.1 g/dL Final   Albumin 01/10/2022 3.8  3.5 - 5.0 g/dL Final   AST 01/10/2022 55 (H)  15 - 41 U/L Final   ALT 01/10/2022 36  0 - 44 U/L Final   Alkaline Phosphatase 01/10/2022 76  38 - 126 U/L Final   Total Bilirubin 01/10/2022 3.3 (H)  0.3  - 1.2 mg/dL Final   GFR, Estimated 01/10/2022 >60  >60 mL/min Final   Comment: (NOTE) Calculated using the CKD-EPI Creatinine Equation (2021)    Anion gap 01/10/2022 10  5 - 15 Final   Performed at [place] [address], [place], [place] [address]   WBC 01/10/2022 5.7  4.0 - 10.5 K/uL Final   RBC 01/10/2022 5.16  4.22 - 5.81 MIL/uL Final   Hemoglobin 01/10/2022 11.8 (L)  13.0 - 17.0 g/dL Final   HCT 01/10/2022 39.0  39.0 - 52.0 % Final   MCV 01/10/2022 75.6 (L)  80.0 - 100.0 fL Final   MCH 01/10/2022 22.9 (L)  26.0 - 34.0 pg Final   MCHC 01/10/2022 30.3  30.0 - 36.0 g/dL Final   RDW 01/10/2022 18.4 (H)  11.5 - 15.5 % Final   Platelets 01/10/2022 138 (L)  150 - 400 K/uL Final   REPEATED TO VERIFY   nRBC 01/10/2022 0.0  0.0 - 0.2 % Final   Neutrophils Relative % 01/10/2022 61  % Final   Neutro Abs 01/10/2022 3.5  1.7 - 7.7 K/uL Final   Lymphocytes Relative 01/10/2022 27  % Final   Lymphs Abs 01/10/2022 1.5  0.7 - 4.0 K/uL Final   Monocytes Relative 01/10/2022 10  % Final   Monocytes Absolute 01/10/2022 0.6  0.1 - 1.0 K/uL Final   Eosinophils Relative 01/10/2022 1  % Final   Eosinophils Absolute 01/10/2022 0.0  0.0 - 0.5 K/uL Final   Basophils Relative 01/10/2022 1  % Final   Basophils Absolute 01/10/2022 0.1  0.0 - 0.1 K/uL Final   Immature Granulocytes 01/10/2022 0  % Final   Abs Immature Granulocytes 01/10/2022 0.02  0.00 - 0.07 K/uL Final   Performed at [facility], [place] [address] [place], [place] [address]   Opiates 01/10/2022 NONE DETECTED  NONE DETECTED Final   Cocaine 01/10/2022 NONE DETECTED  NONE DETECTED Final   Benzodiazepines 01/10/2022 NONE DETECTED  NONE DETECTED Final   Amphetamines 01/10/2022 NONE DETECTED  NONE DETECTED Final   Tetrahydrocannabinol 01/10/2022 NONE DETECTED  NONE DETECTED Final   Barbiturates 01/10/2022 NONE DETECTED  NONE DETECTED Final   Comment: (NOTE) DRUG SCREEN FOR MEDICAL PURPOSES ONLY.  IF CONFIRMATION IS NEEDED FOR  ANY PURPOSE, NOTIFY LAB WITHIN 5 DAYS.  LOWEST DETECTABLE LIMITS FOR URINE DRUG SCREEN Drug Class                     Cutoff (ng/mL) Amphetamine and metabolites    1000 Barbiturate and metabolites    200 Benzodiazepine                 034 Tricyclics and metabolites     300 Opiates and metabolites        300 Cocaine and metabolites  300 THC                            50 Performed at [facility], [place] [address], [place], [place] [address]    Color, Urine 01/10/2022 YELLOW  YELLOW Final   APPearance 01/10/2022 CLEAR  CLEAR Final   Specific Gravity, Urine 01/10/2022 1.003 (L)  1.005 - 1.030 Final   pH 01/10/2022 5.0  5.0 - 8.0 Final   Glucose, UA 01/10/2022 NEGATIVE  NEGATIVE mg/dL Final   Hgb urine dipstick 01/10/2022 SMALL (A)  NEGATIVE Final   Bilirubin Urine 01/10/2022 NEGATIVE  NEGATIVE Final   Ketones, ur 01/10/2022 5 (A)  NEGATIVE mg/dL Final   Protein, ur 01/10/2022 NEGATIVE  NEGATIVE mg/dL Final   Nitrite 01/10/2022 NEGATIVE  NEGATIVE Final   Leukocytes,Ua 01/10/2022 NEGATIVE  NEGATIVE Final   WBC, UA 01/10/2022 0-5  0 - 5 WBC/hpf Final   Bacteria, UA 01/10/2022 NONE SEEN  NONE SEEN Final   Squamous Epithelial / LPF 01/10/2022 0-5  0 - 5 Final   Performed at [facility], [place] [address], [place], [place] [address]   Lipase 01/10/2022 37  11 - 51 U/L Final   Performed at [place] [address], [place], [place] [address]   Total CK 01/10/2022 163  49 - 397 U/L Final   Performed at [facility], [place] [address] [place], [place] [address]  Admission on 01/05/2022, Discharged on 01/06/2022  Component Date Value Ref Range Status   Sodium 01/05/2022 138  135 - 145 mmol/L Final   Potassium 01/05/2022 3.7  3.5 - 5.1 mmol/L Final   Chloride 01/05/2022 103  98 - 111 mmol/L Final   CO2 01/05/2022 23  22 - 32 mmol/L Final   Glucose, Bld 01/05/2022 89  70 - 99 mg/dL Final   Glucose reference range applies only to samples taken after fasting for  at least 8 hours.   BUN 01/05/2022 <5 (L)  8 - 23 mg/dL Final   Creatinine, Ser 01/05/2022 0.89  0.61 - 1.24 mg/dL Final   Calcium 01/05/2022 9.1  8.9 - 10.3 mg/dL Final   Total Protein 01/05/2022 7.9  6.5 - 8.1 g/dL Final   Albumin 01/05/2022 3.8  3.5 - 5.0 g/dL Final   AST 01/05/2022 46 (H)  15 - 41 U/L Final   ALT 01/05/2022 33  0 - 44 U/L Final   Alkaline Phosphatase 01/05/2022 79  38 - 126 U/L Final   Total Bilirubin 01/05/2022 2.1 (H)  0.3 - 1.2 mg/dL Final   GFR, Estimated 01/05/2022 >60  >60 mL/min Final   Comment: (NOTE) Calculated using the CKD-EPI Creatinine Equation (2021)    Anion gap 01/05/2022 12  5 - 15 Final   Performed at [facility], [place] [address], [place], [place] [address]   Alcohol, Ethyl (B) 01/05/2022 142 (H)  <10 mg/dL Final   Comment: (NOTE) Lowest detectable limit for serum alcohol is 10 mg/dL.  For medical purposes only. Performed at [facility], [place] [address] [place] [address] [place], [place] [address]    WBC 01/05/2022 6.8  4.0 - 10.5 K/uL Final   RBC 01/05/2022 5.26  4.22 - 5.81 MIL/uL Final   Hemoglobin 01/05/2022 12.2 (L)  13.0 - 17.0 g/dL Final   HCT 01/05/2022 39.6  39.0 - 52.0 % Final   MCV 01/05/2022 75.3 (L)  80.0 - 100.0 fL Final  MCH 01/05/2022 23.2 (L)  26.0 - 34.0 pg Final   MCHC 01/05/2022 30.8  30.0 - 36.0 g/dL Final   RDW 01/05/2022 18.8 (H)  11.5 - 15.5 % Final   Platelets 01/05/2022 124 (L)  150 - 400 K/uL Final   REPEATED TO VERIFY   nRBC 01/05/2022 0.0  0.0 - 0.2 % Final   Performed at [facility], [place] [address], [place], [place] [address]   Opiates 01/05/2022 NONE DETECTED  NONE DETECTED Final   Cocaine 01/05/2022 POSITIVE (A)  NONE DETECTED Final   Benzodiazepines 01/05/2022 NONE DETECTED  NONE DETECTED Final   Amphetamines 01/05/2022 NONE DETECTED  NONE DETECTED Final   Tetrahydrocannabinol 01/05/2022 NONE DETECTED  NONE DETECTED Final   Barbiturates 01/05/2022 NONE DETECTED  NONE DETECTED Final   Comment:  (NOTE) DRUG SCREEN FOR MEDICAL PURPOSES ONLY.  IF CONFIRMATION IS NEEDED FOR ANY PURPOSE, NOTIFY LAB WITHIN 5 DAYS.  LOWEST DETECTABLE LIMITS FOR URINE DRUG SCREEN Drug Class                     Cutoff (ng/mL) Amphetamine and metabolites    1000 Barbiturate and metabolites    200 Benzodiazepine                 588 Tricyclics and metabolites     300 Opiates and metabolites        300 Cocaine and metabolites        300 THC                            50 Performed at [facility], [place] [address] [place], [place] [address]    SARS Coronavirus 2 by RT PCR 01/05/2022 NEGATIVE  NEGATIVE Final   Comment: (NOTE) SARS-CoV-2 target nucleic acids are NOT DETECTED.  The SARS-CoV-2 RNA is generally detectable in upper respiratory specimens during the acute phase of infection. The lowest concentration of SARS-CoV-2 viral copies this assay can detect is 138 copies/mL. A negative result does not preclude SARS-Cov-2 infection and should not be used as the sole basis for treatment or other patient management decisions. A negative result may occur with  improper specimen collection/handling, submission of specimen other than nasopharyngeal swab, presence of viral mutation(s) within the areas targeted by this assay, and inadequate number of viral copies(<138 copies/mL). A negative result must be combined with clinical observations, patient history, and epidemiological information. The expected result is Negative.  Fact Sheet for Patients:  EntrepreneurPulse.com.au  Fact Sheet for Healthcare Providers:  IncredibleEmployment.be  This test is no                          t yet approved or cleared by the [place] FDA and  has been authorized for detection and/or diagnosis of SARS-CoV-2 by FDA under an Emergency Use Authorization (EUA). This EUA will remain  in effect (meaning this test can be used) for the duration of the COVID-19 declaration under  Section 564(b)(1) of the Act, 21 U.S.C.section 360bbb-3(b)(1), unless the authorization is terminated  or revoked sooner.       Influenza A by PCR 01/05/2022 NEGATIVE  NEGATIVE Final   Influenza B by PCR 01/05/2022 NEGATIVE  NEGATIVE Final   Comment: (NOTE) The Xpert Xpress SARS-CoV-2/FLU/RSV plus assay is intended as an aid in the diagnosis of influenza from Nasopharyngeal swab specimens and should not be used as a  sole basis for treatment. Nasal washings and aspirates are unacceptable for Xpert Xpress SARS-CoV-2/FLU/RSV testing.  Fact Sheet for Patients: EntrepreneurPulse.com.au  Fact Sheet for Healthcare Providers: IncredibleEmployment.be  This test is not yet approved or cleared by the [place] FDA and has been authorized for detection and/or diagnosis of SARS-CoV-2 by FDA under an Emergency Use Authorization (EUA). This EUA will remain in effect (meaning this test can be used) for the duration of the COVID-19 declaration under Section 564(b)(1) of the Act, 21 U.S.C. section 360bbb-3(b)(1), unless the authorization is terminated or revoked.  Performed at [facility], [place] [address], [place], [place] [address]   Admission on 12/04/2021, Discharged on 12/05/2021  Component Date Value Ref Range Status   Sodium 12/04/2021 140  135 - 145 mmol/L Final   Potassium 12/04/2021 3.9  3.5 - 5.1 mmol/L Final   Chloride 12/04/2021 105  98 - 111 mmol/L Final   CO2 12/04/2021 23  22 - 32 mmol/L Final   Glucose, Bld 12/04/2021 88  70 - 99 mg/dL Final   Glucose reference range applies only to samples taken after fasting for at least 8 hours.   BUN 12/04/2021 13  8 - 23 mg/dL Final   Creatinine, Ser 12/04/2021 0.87  0.61 - 1.24 mg/dL Final   Calcium 12/04/2021 9.1  8.9 - 10.3 mg/dL Final   GFR, Estimated 12/04/2021 >60  >60 mL/min Final   Comment: (NOTE) Calculated using the CKD-EPI Creatinine Equation (2021)    Anion gap 12/04/2021 12  5  - 15 Final   Performed at [facility], [place] [address], [place], [place] [address]   WBC 12/04/2021 8.1  4.0 - 10.5 K/uL Final   RBC 12/04/2021 5.04  4.22 - 5.81 MIL/uL Final   Hemoglobin 12/04/2021 11.8 (L)  13.0 - 17.0 g/dL Final   HCT 12/04/2021 38.4 (L)  39.0 - 52.0 % Final   MCV 12/04/2021 76.2 (L)  80.0 - 100.0 fL Final   MCH 12/04/2021 23.4 (L)  26.0 - 34.0 pg Final   MCHC 12/04/2021 30.7  30.0 - 36.0 g/dL Final   RDW 12/04/2021 15.4  11.5 - 15.5 % Final   Platelets 12/04/2021 162  150 - 400 K/uL Final   nRBC 12/04/2021 0.0  0.0 - 0.2 % Final   Performed at [facility], [place] [address], [place], [place] [address]   Troponin I (High Sensitivity) 12/04/2021 6  <18 ng/L Final   Comment: (NOTE) Elevated high sensitivity troponin I (hsTnI) values and significant  changes across serial measurements may suggest ACS but many other  chronic and acute conditions are known to elevate hsTnI results.  Refer to the "Links" section for chest pain algorithms and additional  guidance. Performed at [facility], [place] [address], [place], [place] [address]    Alcohol, Ethyl (B) 12/04/2021 146 (H)  <10 mg/dL Final   Comment: (NOTE) Lowest detectable limit for serum alcohol is 10 mg/dL.  For medical purposes only. Performed at [facility], [place] [address] [place], [place] [address]    Salicylate Lvl 35/70/1779 <7.0 (L)  7.0 - 30.0 mg/dL Final   Performed at [place] [address] [place], [place] [address]   Acetaminophen (Tylenol), Serum 12/04/2021 <10 (L)  10 - 30 ug/mL Final   Comment: (NOTE) Therapeutic concentrations vary significantly. A range of 10-30 ug/mL  may be an effective concentration for many patients. However, some  are best treated at  concentrations outside of this range. Acetaminophen concentrations >150 ug/mL at 4 hours after ingestion  and >50 ug/mL at 12 hours after ingestion are often associated with  toxic reactions.  Performed  at [facility], [place] [address] [place], [place] [address]    Opiates 12/04/2021 NONE DETECTED  NONE DETECTED Final   Cocaine 12/04/2021 NONE DETECTED  NONE DETECTED Final   Benzodiazepines 12/04/2021 NONE DETECTED  NONE DETECTED Final   Amphetamines 12/04/2021 NONE DETECTED  NONE DETECTED Final   Tetrahydrocannabinol 12/04/2021 NONE DETECTED  NONE DETECTED Final   Barbiturates 12/04/2021 NONE DETECTED  NONE DETECTED Final   Comment: (NOTE) DRUG SCREEN FOR MEDICAL PURPOSES ONLY.  IF CONFIRMATION IS NEEDED FOR ANY PURPOSE, NOTIFY LAB WITHIN 5 DAYS.  LOWEST DETECTABLE LIMITS FOR URINE DRUG SCREEN Drug Class                     Cutoff (ng/mL) Amphetamine and metabolites    1000 Barbiturate and metabolites    200 Benzodiazepine                 098 Tricyclics and metabolites     300 Opiates and metabolites        300 Cocaine and metabolites        300 THC                            50 Performed at [facility], [place] [address] [place], [place] [address]    SARS Coronavirus 2 by RT PCR 12/04/2021 NEGATIVE  NEGATIVE Final   Comment: (NOTE) SARS-CoV-2 target nucleic acids are NOT DETECTED.  The SARS-CoV-2 RNA is generally detectable in upper respiratory specimens during the acute phase of infection. The lowest concentration of SARS-CoV-2 viral copies this assay can detect is 138 copies/mL. A negative result does not preclude SARS-Cov-2 infection and should not be used as the sole basis for treatment or other patient management decisions. A negative result may occur with  improper specimen collection/handling, submission of specimen other than nasopharyngeal swab, presence of viral mutation(s) within the areas targeted by this assay, and inadequate number of viral copies(<138 copies/mL). A negative result must be combined with clinical observations, patient history, and epidemiological information. The expected result is Negative.  Fact Sheet for Patients:   EntrepreneurPulse.com.au  Fact Sheet for Healthcare Providers:  IncredibleEmployment.be  This test is no                          t yet approved or cleared by the [place] FDA and  has been authorized for detection and/or diagnosis of SARS-CoV-2 by FDA under an Emergency Use Authorization (EUA). This EUA will remain  in effect (meaning this test can be used) for the duration of the COVID-19 declaration under Section 564(b)(1) of the Act, 21 U.S.C.section 360bbb-3(b)(1), unless the authorization is terminated  or revoked sooner.       Influenza A by PCR 12/04/2021 NEGATIVE  NEGATIVE Final   Influenza B by PCR 12/04/2021 NEGATIVE  NEGATIVE Final   Comment: (NOTE) The Xpert Xpress SARS-CoV-2/FLU/RSV plus assay is intended as an aid in the diagnosis of influenza from Nasopharyngeal swab specimens and should not be used as a sole basis for treatment. Nasal washings and aspirates are unacceptable for Xpert Xpress SARS-CoV-2/FLU/RSV testing.  Fact Sheet for Patients: EntrepreneurPulse.com.au  Fact Sheet for Healthcare Providers: IncredibleEmployment.be  This test  is not yet approved or cleared by the [place] and has been authorized for detection and/or diagnosis of SARS-CoV-2 by FDA under an Emergency Use Authorization (EUA). This EUA will remain in effect (meaning this test can be used) for the duration of the COVID-19 declaration under Section 564(b)(1) of the Act, 21 U.S.C. section 360bbb-3(b)(1), unless the authorization is terminated or revoked.  Performed at [facility], [place] [address] [place], [place] [address]    Troponin I (High Sensitivity) 12/05/2021 6  <18 ng/L Final   Comment: (NOTE) Elevated high sensitivity troponin I (hsTnI) values and significant  changes across serial measurements may suggest ACS but many other  chronic and acute conditions are known to elevate hsTnI  results.  Refer to the "Links" section for chest pain algorithms and additional  guidance. Performed at [facility], [place] [address], [place], [place] [address]   There may be more visits with results that are not included.    Allergies: Carrot [daucus carota]  PTA Medications: (Not in a hospital admission)   Medical Decision Making  Patient reports that he is unable to contract for safety at this time patient will be admitted to [facility] for continuous assessment with follow-up by psychiatry on 05-30-2022 Lab Orders         Resp Panel by RT-PCR (Flu A&B, Covid) Anterior Nasal Swab         CBC with Differential/Platelet         Comprehensive metabolic panel         Ethanol         POCT Urine Drug Screen - (I-Screen)         POC SARS Coronavirus 2 Ag    Alcohol abuse -CIWA protocol ordered   Resume home medications    Recommendations  Based on my evaluation the patient does not appear to have an emergency medical condition.  Ophelia Shoulder, NP 05/29/22  8:48 PM

## 2022-05-29 NOTE — Progress Notes (Signed)
   05/29/22 1930  [facility]UC Triage Screening (Walk-ins at [facility] only)  How Did You Hear About [place]? Self  What Is the Reason for Your Visit/Call Today? [name] is a 66 year old male presenting as a voluntary walk-in to [facility] due to [facility] with plan to overdose on drugs. Per chart, patient has psychiatric history of schizoaffective disorder Bipolar type, MDD, alcohol intoxication  without complications, cocaine use disorder, chronic alcoholism, substance induced mood disorder, MDD severe, without psychotic features. Patient reported transportation gave him a ride to somewhere and dropped him off at the wrong place, therefore he never received any help after he was discharged from [facility] on 05/25/22. Patient reported being released from prison 11/2021 after being incarcerated for 11 years. Patient states, "I don't know how to live out here since I been released". Patient is currently homeless. Patient reported being paranoid and feeling that "people, all are trying to hurt me". Patient denied auditory and reported visual hallucinations of seeing shadows. Patient using alcohol, 6-7 40 ounce beers daily. Patient reported using $60.00 worth of cocaine daily. Patient reported using heroin, $400.00 this week. Patient reported he is now out of money. Patient unable to contract for safety. Patient was irritable and cooperative during assessment.  How Long Has This Been Causing You Problems? 1-6 months  Have You Recently Had Any Thoughts About Hurting Yourself? Yes  How long ago did you have thoughts about hurting yourself? currently having thoughts  Are You Planning to Commit Suicide/Harm Yourself At This time? Yes  Have you Recently Had Thoughts About Hurting Someone [name]? No  Are You Planning To Harm Someone At This Time? No  Are you currently experiencing any auditory, visual or other hallucinations? Yes  Please explain the hallucinations you are currently experiencing: seeing shadows   Have You Used Any Alcohol or Drugs in the Past 24 Hours? Yes  How long ago did you use Drugs or Alcohol? prior to arrival  What Did You Use and How Much? 6-7x 40 ounce beers  Do you have any current medical co-morbidities that require immediate attention? Yes  Please describe current medical co-morbidities that require immediate attention: "pain everywhere"  Clinician description of patient physical appearance/behavior: casual / irritated  What Do You Feel Would Help You the Most Today? Alcohol or Drug Use Treatment;Treatment for Depression or other mood problem  If access to [facility] was not available, would you have sought care in the Emergency Department? Yes  Determination of Need Emergent (2 hours)  Options For Referral Medication Management;Facility-Based Crisis;Inpatient Hospitalization;Outpatient Therapy;BH Urgent Care

## 2022-05-29 NOTE — ED Notes (Signed)
Pt resting in bed asleep at this hour. No apparent distress. RR even and unlabored. Monitored for safety.
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
## 2022-05-30 ENCOUNTER — Encounter: Payer: Medicaid Other | Admitting: Family

## 2022-05-30 DIAGNOSIS — Z7689 Persons encountering health services in other specified circumstances: Secondary | ICD-10-CM

## 2022-05-30 DIAGNOSIS — F1090 Alcohol use, unspecified, uncomplicated: Secondary | ICD-10-CM | POA: Diagnosis present

## 2022-05-30 DIAGNOSIS — F250 Schizoaffective disorder, bipolar type: Secondary | ICD-10-CM | POA: Diagnosis not present

## 2022-05-30 MED ORDER — THIAMINE HCL 100 MG PO TABS
100.0000 mg | ORAL_TABLET | Freq: Every day | ORAL | Status: DC
  Administered 2022-05-31 – 2022-06-01 (×2): 100 mg via ORAL
  Filled 2022-05-30 (×2): qty 1

## 2022-05-30 MED ORDER — POLYVINYL ALCOHOL 1.4 % OP SOLN
1.0000 [drp] | OPHTHALMIC | Status: DC | PRN
  Administered 2022-05-30 – 2022-06-01 (×5): 1 [drp] via OPHTHALMIC
  Filled 2022-05-30: qty 15

## 2022-05-30 MED ORDER — ADULT MULTIVITAMIN W/MINERALS CH
1.0000 | ORAL_TABLET | Freq: Every day | ORAL | Status: DC
  Administered 2022-05-30 – 2022-06-01 (×3): 1 via ORAL
  Filled 2022-05-30 (×3): qty 1

## 2022-05-30 MED ORDER — GABAPENTIN 400 MG PO CAPS
400.0000 mg | ORAL_CAPSULE | Freq: Three times a day (TID) | ORAL | Status: DC
  Administered 2022-05-30 – 2022-06-01 (×7): 400 mg via ORAL
  Filled 2022-05-30 (×7): qty 1

## 2022-05-30 MED ORDER — CLONIDINE HCL 0.1 MG PO TABS
0.1000 mg | ORAL_TABLET | Freq: Once | ORAL | Status: AC
  Administered 2022-05-30: 0.1 mg via ORAL
  Filled 2022-05-30: qty 1

## 2022-05-30 MED ORDER — LORAZEPAM 1 MG PO TABS: 1.0000 mg | ORAL_TABLET | Freq: Every day | ORAL | Status: DC

## 2022-05-30 MED ORDER — GABAPENTIN 300 MG PO CAPS
300.0000 mg | ORAL_CAPSULE | Freq: Three times a day (TID) | ORAL | Status: DC
  Administered 2022-05-30: 300 mg via ORAL
  Filled 2022-05-30: qty 1

## 2022-05-30 MED ORDER — AMLODIPINE BESYLATE 5 MG PO TABS
5.0000 mg | ORAL_TABLET | Freq: Every day | ORAL | Status: DC
  Administered 2022-05-30 – 2022-06-01 (×3): 5 mg via ORAL
  Filled 2022-05-30 (×3): qty 1

## 2022-05-30 MED ORDER — LORAZEPAM 1 MG PO TABS
1.0000 mg | ORAL_TABLET | Freq: Four times a day (QID) | ORAL | Status: AC
  Administered 2022-05-30 (×4): 1 mg via ORAL
  Filled 2022-05-30 (×4): qty 1

## 2022-05-30 MED ORDER — LORAZEPAM 1 MG PO TABS
1.0000 mg | ORAL_TABLET | Freq: Three times a day (TID) | ORAL | Status: AC
  Administered 2022-05-31 (×3): 1 mg via ORAL
  Filled 2022-05-30 (×3): qty 1

## 2022-05-30 MED ORDER — LORAZEPAM 1 MG PO TABS
1.0000 mg | ORAL_TABLET | Freq: Two times a day (BID) | ORAL | Status: DC
  Administered 2022-06-01: 1 mg via ORAL
  Filled 2022-05-30: qty 1

## 2022-05-30 NOTE — ED Notes (Signed)
Patient anxious about MD appt he has today - denies SI, HI, AVH at this time - will continue to monitor for safety

## 2022-05-30 NOTE — ED Notes (Signed)
Pt asleep at this hour. No apparent distress. RR even and unlabored. Monitored for safety.  
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
## 2022-05-30 NOTE — ED Notes (Signed)
Pt is in the bed sleeping. Respirations are even and unlabored. No acute distress noted. Will continue to monitor for safety. 

## 2022-05-30 NOTE — Progress Notes (Signed)
Pt's CIWA was 6. °

## 2022-05-30 NOTE — ED Provider Notes (Signed)
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 is 138 copies/mL. A negative result does not preclude SARS-Cov-2 infection and should not be used as the sole basis for treatment or other patient management decisions. A negative result may  occur with  improper specimen collection/handling, submission of specimen other than nasopharyngeal swab, presence of viral mutation(s) within the areas targeted by this assay, and inadequate number of viral copies(<138 copies/mL). A negative result must be combined with clinical observations, patient history, and epidemiological information. The expected result is Negative.  Fact Sheet for Patients:  BloggerCourse.com  Fact Sheet for Healthcare Providers:  SeriousBroker.it  This test is no                          t yet approved or cleared by the [place] FDA and  has been authorized for detection and/or diagnosis of SARS-CoV-2 by FDA under an Emergency Use Authorization (EUA). This EUA will remain  in effect (meaning this test can be used) for the duration of the COVID-19 declaration under Section 564(b)(1) of the Act, 21 U.S.C.section 360bbb-3(b)(1), unless the authorization is terminated  or revoked sooner.       Influenza A by PCR 05/10/2022 NEGATIVE  NEGATIVE Final   Influenza B by  PCR 05/10/2022 NEGATIVE  NEGATIVE Final   Comment: (NOTE) The Xpert Xpress SARS-CoV-2/FLU/RSV plus assay is intended as an aid in the diagnosis of influenza from Nasopharyngeal swab specimens and should not be used as a sole basis for treatment. Nasal washings and aspirates are unacceptable for Xpert Xpress SARS-CoV-2/FLU/RSV testing.  Fact Sheet for Patients: BloggerCourse.com  Fact Sheet for Healthcare Providers: SeriousBroker.it  This test is not yet approved or cleared by the [place] FDA and has been authorized for detection and/or diagnosis of SARS-CoV-2 by FDA under an Emergency Use Authorization (EUA). This EUA will remain in effect (meaning this test can be used) for the duration of the COVID-19 declaration under Section 564(b)(1) of the Act, 21 U.S.C. section  360bbb-3(b)(1), unless the authorization is terminated or revoked.  Performed at [facility], [address], [place], [place] [address]    Sodium 05/10/2022 137  135 - 145 mmol/L Final   Potassium 05/10/2022 3.7  3.5 - 5.1 mmol/L Final   Chloride 05/10/2022 106  98 - 111 mmol/L Final   CO2 05/10/2022 23  22 - 32 mmol/L Final   Glucose, Bld 05/10/2022 90  70 - 99 mg/dL Final   Glucose reference range applies only to samples taken after fasting for at least 8 hours.   BUN 05/10/2022 13  8 - 23 mg/dL Final   Creatinine, Ser 05/10/2022 1.04  0.61 - 1.24 mg/dL Final   Calcium 60/45/4098 8.8 (L)  8.9 - 10.3 mg/dL Final   Total Protein 11/91/4782 7.8  6.5 - 8.1 g/dL Final   Albumin 95/62/1308 3.6  3.5 - 5.0 g/dL Final   AST 65/78/4696 30  15 - 41 U/L Final   ALT 05/10/2022 21  0 - 44 U/L Final   Alkaline Phosphatase 05/10/2022 62  38 - 126 U/L Final   Total Bilirubin 05/10/2022 0.8  0.3 - 1.2 mg/dL Final   GFR, Estimated 05/10/2022 >60  >60 mL/min Final   Comment: (NOTE) Calculated using the CKD-EPI Creatinine Equation (2021)    Anion gap 05/10/2022 8  5 - 15 Final   Performed at [facility], [address], [place], [place] [address]   Alcohol, Ethyl (B) 05/10/2022 156 (H)  <10 mg/dL Final   Comment: (NOTE) Lowest detectable limit for serum alcohol is 10 mg/dL.  For medical purposes only. Performed at [facility], [address] [place], [place] [address]    Opiates 05/10/2022 POSITIVE (A)  NONE DETECTED Final   Cocaine 05/10/2022 POSITIVE (A)  NONE DETECTED Final   Benzodiazepines 05/10/2022 POSITIVE (A)  NONE DETECTED Final   Amphetamines 05/10/2022 NONE DETECTED  NONE DETECTED Final   Tetrahydrocannabinol 05/10/2022 NONE DETECTED  NONE DETECTED Final   Barbiturates 05/10/2022 NONE DETECTED  NONE DETECTED Final   Comment: (NOTE) DRUG SCREEN FOR MEDICAL PURPOSES ONLY.  IF CONFIRMATION IS NEEDED FOR ANY PURPOSE,  NOTIFY LAB WITHIN 5 DAYS.  LOWEST DETECTABLE LIMITS FOR URINE DRUG SCREEN Drug Class                     Cutoff (ng/mL) Amphetamine and metabolites    1000 Barbiturate and metabolites    200 Benzodiazepine                 200 Tricyclics and metabolites     300 Opiates and metabolites        300 Cocaine and metabolites        300 THC  50 Performed at [facility], [address] [place], [place] [address]    WBC 05/10/2022 4.5  4.0 - 10.5 K/uL Final   RBC 05/10/2022 3.77 (L)  4.22 - 5.81 MIL/uL Final   Hemoglobin 05/10/2022 8.3 (L)  13.0 - 17.0 g/dL Final   Comment: Reticulocyte Hemoglobin testing may be clinically indicated, consider ordering this additional test [id number]    HCT 05/10/2022 27.3 (L)  39.0 - 52.0 % Final   MCV 05/10/2022 72.4 (L)  80.0 - 100.0 fL Final   MCH 05/10/2022 22.0 (L)  26.0 - 34.0 pg Final   MCHC 05/10/2022 30.4  30.0 - 36.0 g/dL Final   RDW 11/91/4782 19.8 (H)  11.5 - 15.5 % Final   Platelets 05/10/2022 162  150 - 400 K/uL Final   nRBC 05/10/2022 0.0  0.0 - 0.2 % Final   Neutrophils Relative % 05/10/2022 55  % Final   Neutro Abs 05/10/2022 2.5  1.7 - 7.7 K/uL Final   Lymphocytes Relative 05/10/2022 32  % Final   Lymphs Abs 05/10/2022 1.4  0.7 - 4.0 K/uL Final   Monocytes Relative 05/10/2022 10  % Final   Monocytes Absolute 05/10/2022 0.4  0.1 - 1.0 K/uL Final   Eosinophils Relative 05/10/2022 2  % Final   Eosinophils Absolute 05/10/2022 0.1  0.0 - 0.5 K/uL Final   Basophils Relative 05/10/2022 1  % Final   Basophils Absolute 05/10/2022 0.1  0.0 - 0.1 K/uL Final   Immature Granulocytes 05/10/2022 0  % Final   Abs Immature Granulocytes 05/10/2022 0.01  0.00 - 0.07 K/uL Final   Performed at [facility], [address], [place], [place] [address]   Salicylate Lvl 05/10/2022 <7.0 (L)  7.0 - 30.0 mg/dL Final   Performed at [facility], [address]  [place], [place] [address]   Acetaminophen (Tylenol), Serum 05/10/2022 <10 (L)  10 - 30 ug/mL Final   Comment: (NOTE) Therapeutic concentrations vary significantly. A range of 10-30 ug/mL  may be an effective concentration for many patients. However, some  are best treated at concentrations outside of this range. Acetaminophen concentrations >150 ug/mL at 4 hours after ingestion  and >50 ug/mL at 12 hours after ingestion are often associated with  toxic reactions.  Performed at [facility], [address], [place], [place] [address]    Magnesium 05/10/2022 2.0  1.7 - 2.4 mg/dL Final   Performed at [facility], [address] [place], [place] [address]   Color, Urine 05/10/2022 STRAW (A)  YELLOW Final   APPearance 05/10/2022 CLEAR  CLEAR Final   Specific Gravity, Urine 05/10/2022 1.005  1.005 - 1.030 Final   pH 05/10/2022 5.0  5.0 - 8.0 Final   Glucose, UA 05/10/2022 NEGATIVE  NEGATIVE mg/dL Final   Hgb urine dipstick 05/10/2022 NEGATIVE  NEGATIVE Final   Bilirubin Urine 05/10/2022 NEGATIVE  NEGATIVE Final   Ketones, ur 05/10/2022 NEGATIVE  NEGATIVE mg/dL Final   Protein, ur 84/13/2440 NEGATIVE  NEGATIVE mg/dL Final   Nitrite 08/13/2535 NEGATIVE  NEGATIVE Final   Leukocytes,Ua 05/10/2022 NEGATIVE  NEGATIVE Final   Performed at [facility], [address] [place], [place] [address]  Admission on 05/05/2022, Discharged on 05/06/2022  Component Date Value Ref Range Status   WBC 05/05/2022 7.3  4.0 - 10.5 K/uL Final   RBC 05/05/2022 3.68 (L)  4.22 - 5.81 MIL/uL Final   Hemoglobin 05/05/2022 8.2 (L)  13.0 -  17.0 g/dL Final   Comment: Reticulocyte Hemoglobin testing may be clinically indicated, consider ordering this additional test [id number]    HCT 05/05/2022 27.1 (L)  39.0 - 52.0 % Final   MCV 05/05/2022 73.6 (L)  80.0 - 100.0 fL Final   MCH 05/05/2022 22.3 (L)  26.0 - 34.0 pg Final   MCHC 05/05/2022 30.3  30.0 - 36.0 g/dL  Final   RDW 54/06/8118 19.6 (H)  11.5 - 15.5 % Final   Platelets 05/05/2022 234  150 - 400 K/uL Final   nRBC 05/05/2022 0.0  0.0 - 0.2 % Final   Neutrophils Relative % 05/05/2022 44  % Final   Neutro Abs 05/05/2022 3.2  1.7 - 7.7 K/uL Final   Lymphocytes Relative 05/05/2022 38  % Final   Lymphs Abs 05/05/2022 2.8  0.7 - 4.0 K/uL Final   Monocytes Relative 05/05/2022 13  % Final   Monocytes Absolute 05/05/2022 0.9  0.1 - 1.0 K/uL Final   Eosinophils Relative 05/05/2022 4  % Final   Eosinophils Absolute 05/05/2022 0.3  0.0 - 0.5 K/uL Final   Basophils Relative 05/05/2022 1  % Final   Basophils Absolute 05/05/2022 0.1  0.0 - 0.1 K/uL Final   Immature Granulocytes 05/05/2022 0  % Final   Abs Immature Granulocytes 05/05/2022 0.02  0.00 - 0.07 K/uL Final   Performed at [facility], [address], [place], [place] [address]   Sodium 05/05/2022 138  135 - 145 mmol/L Final   Potassium 05/05/2022 3.8  3.5 - 5.1 mmol/L Final   Chloride 05/05/2022 109  98 - 111 mmol/L Final   CO2 05/05/2022 17 (L)  22 - 32 mmol/L Final   Glucose, Bld 05/05/2022 91  70 - 99 mg/dL Final   Glucose reference range applies only to samples taken after fasting for at least 8 hours.   BUN 05/05/2022 9  8 - 23 mg/dL Final   Creatinine, Ser 05/05/2022 0.87  0.61 - 1.24 mg/dL Final   Calcium 95/62/1308 8.6 (L)  8.9 - 10.3 mg/dL Final   Total Protein 65/78/4696 7.9  6.5 - 8.1 g/dL Final   Albumin 29/52/8413 3.3 (L)  3.5 - 5.0 g/dL Final   AST 24/40/1027 33  15 - 41 U/L Final   ALT 05/05/2022 23  0 - 44 U/L Final   Alkaline Phosphatase 05/05/2022 60  38 - 126 U/L Final   Total Bilirubin 05/05/2022 0.9  0.3 - 1.2 mg/dL Final   GFR, Estimated 05/05/2022 >60  >60 mL/min Final   Comment: (NOTE) Calculated using the CKD-EPI Creatinine Equation (2021)    Anion gap 05/05/2022 12  5 - 15 Final   Performed at [facility], [address], [place], [place] [address]   Alcohol, Ethyl (B) 05/05/2022 169 (H)  <10 mg/dL  Final   Comment: (NOTE) Lowest detectable limit for serum alcohol is 10 mg/dL.  For medical purposes only. Performed at [facility], [address] [place], [place] [address]    Salicylate Lvl 05/05/2022 <7.0 (L)  7.0 - 30.0 mg/dL Final   Performed at [facility], [address] [place], [place] [address]   Acetaminophen (Tylenol), Serum 05/05/2022 <10 (L)  10 - 30 ug/mL Final   Comment: (NOTE) Therapeutic concentrations vary significantly. A range of 10-30 ug/mL  may be an effective concentration for many patients. However, some  are best treated at concentrations outside of this range. Acetaminophen concentrations >150 ug/mL at 4 hours  after ingestion  and >50 ug/mL at 12 hours after ingestion are often associated with  toxic reactions.  Performed at [facility], [address], [place], [place] [address]    Opiates 05/06/2022 NONE DETECTED  NONE DETECTED Final   Cocaine 05/06/2022 NONE DETECTED  NONE DETECTED Final   Benzodiazepines 05/06/2022 POSITIVE (A)  NONE DETECTED Final   Amphetamines 05/06/2022 NONE DETECTED  NONE DETECTED Final   Tetrahydrocannabinol 05/06/2022 NONE DETECTED  NONE DETECTED Final   Barbiturates 05/06/2022 NONE DETECTED  NONE DETECTED Final   Comment: (NOTE) DRUG SCREEN FOR MEDICAL PURPOSES ONLY.  IF CONFIRMATION IS NEEDED FOR ANY PURPOSE, NOTIFY LAB WITHIN 5 DAYS.  LOWEST DETECTABLE LIMITS FOR URINE DRUG SCREEN Drug Class                     Cutoff (ng/mL) Amphetamine and metabolites    1000 Barbiturate and metabolites    200 Benzodiazepine                 200 Tricyclics and metabolites     300 Opiates and metabolites        300 Cocaine and metabolites        300 THC                            50 Performed at [facility], [address] [place], [place] [address]    SARS Coronavirus 2 by RT PCR 05/06/2022 NEGATIVE  NEGATIVE Final   Comment: (NOTE) SARS-CoV-2 target nucleic acids are NOT DETECTED.  The  SARS-CoV-2 RNA is generally detectable in upper respiratory specimens during the acute phase of infection. The lowest concentration of SARS-CoV-2 viral copies this assay can detect is 138 copies/mL. A negative result does not preclude SARS-Cov-2 infection and should not be used as the sole basis for treatment or other patient management decisions. A negative result may occur with  improper specimen collection/handling, submission of specimen other than nasopharyngeal swab, presence of viral mutation(s) within the areas targeted by this assay, and inadequate number of viral copies(<138 copies/mL). A negative result must be combined with clinical observations, patient history, and epidemiological information. The expected result is Negative.  Fact Sheet for Patients:  BloggerCourse.com  Fact Sheet for Healthcare Providers:  SeriousBroker.it  This test is no                          t yet approved or cleared by the [place] FDA and  has been authorized for detection and/or diagnosis of SARS-CoV-2 by FDA under an Emergency Use Authorization (EUA). This EUA will remain  in effect (meaning this test can be used) for the duration of the COVID-19 declaration under Section 564(b)(1) of the Act, 21 U.S.C.section 360bbb-3(b)(1), unless the authorization is terminated  or revoked sooner.       Influenza A by PCR 05/06/2022 NEGATIVE  NEGATIVE Final   Influenza B by PCR 05/06/2022 NEGATIVE  NEGATIVE Final   Comment: (NOTE) The Xpert Xpress SARS-CoV-2/FLU/RSV plus assay is intended as an aid in the diagnosis of influenza from Nasopharyngeal swab specimens and should not be used as a sole basis for treatment. Nasal washings and aspirates are unacceptable for Xpert Xpress SARS-CoV-2/FLU/RSV testing.  Fact Sheet for Patients: BloggerCourse.com  Fact Sheet for Healthcare  Providers: SeriousBroker.it  This test is not yet approved or cleared by the [place] and  has been authorized for detection and/or diagnosis of SARS-CoV-2 by FDA under an Emergency Use Authorization (EUA). This EUA will remain in effect (meaning this test can be used) for the duration of the COVID-19 declaration under Section 564(b)(1) of the Act, 21 U.S.C. section 360bbb-3(b)(1), unless the authorization is terminated or revoked.  Performed at [facility], [address] [place], [place] [address]   Admission on 05/05/2022, Discharged on 05/05/2022  Component Date Value Ref Range Status   Sodium 05/05/2022 138  135 - 145 mmol/L Final   Potassium 05/05/2022 3.7  3.5 - 5.1 mmol/L Final   Chloride 05/05/2022 105  98 - 111 mmol/L Final   CO2 05/05/2022 19 (L)  22 - 32 mmol/L Final   Glucose, Bld 05/05/2022 91  70 - 99 mg/dL Final   Glucose reference range applies only to samples taken after fasting for at least 8 hours.   BUN 05/05/2022 11  8 - 23 mg/dL Final   Creatinine, Ser 05/05/2022 0.90  0.61 - 1.24 mg/dL Final   Calcium 46/96/295207/20/2023 8.9  8.9 - 10.3 mg/dL Final   GFR, Estimated 05/05/2022 >60  >60 mL/min Final   Comment: (NOTE) Calculated using the CKD-EPI Creatinine Equation (2021)    Anion gap 05/05/2022 14  5 - 15 Final   Performed at [facility], [address] [place], [place] [address]   WBC 05/05/2022 8.0  4.0 - 10.5 K/uL Final   RBC 05/05/2022 3.78 (L)  4.22 - 5.81 MIL/uL Final   Hemoglobin 05/05/2022 8.4 (L)  13.0 - 17.0 g/dL Final   Comment: Reticulocyte Hemoglobin testing may be clinically indicated, consider ordering this additional test [id number]    HCT 05/05/2022 27.4 (L)  39.0 - 52.0 % Final   MCV 05/05/2022 72.5 (L)  80.0 - 100.0 fL Final   MCH 05/05/2022 22.2 (L)  26.0 - 34.0 pg Final   MCHC 05/05/2022 30.7  30.0 - 36.0 g/dL Final   RDW 53/66/440307/20/2023 19.8 (H)  11.5 - 15.5 % Final   Platelets 05/05/2022 222   150 - 400 K/uL Final   nRBC 05/05/2022 0.0  0.0 - 0.2 % Final   Performed at [facility], [address] [place], [place] [address]   Troponin I (High Sensitivity) 05/05/2022 10  <18 ng/L Final   Comment: (NOTE) Elevated high sensitivity troponin I (hsTnI) values and significant  changes across serial measurements may suggest ACS but many other  chronic and acute conditions are known to elevate hsTnI results.  Refer to the "Links" section for chest pain algorithms and additional  guidance. Performed at [facility], [address] [place], [place] [address]    Troponin I (High Sensitivity) 05/05/2022 7  <18 ng/L Final   Comment: (NOTE) Elevated high sensitivity troponin I (hsTnI) values and significant  changes across serial measurements may suggest ACS but many other  chronic and acute conditions are known to elevate hsTnI results.  Refer to the "Links" section for chest pain algorithms and additional  guidance. Performed at [facility], [address] [place], [place] [address]   Admission on 04/20/2022, Discharged on 05/03/2022  Component Date Value Ref Range Status   Sodium 04/20/2022 135  135 - 145 mmol/L Final   Potassium 04/20/2022 4.1  3.5 - 5.1 mmol/L Final   Chloride 04/20/2022 100  98 - 111 mmol/L Final   CO2 04/20/2022 19 (L)  22 - 32 mmol/L Final   Glucose, Bld  04/20/2022 90  70 - 99 mg/dL Final   Glucose reference range applies only to samples taken after fasting for at least 8 hours.   BUN 04/20/2022 10  8 - 23 mg/dL Final   Creatinine, Ser 04/20/2022 1.06  0.61 - 1.24 mg/dL Final   Calcium 96/01/5408 8.8 (L)  8.9 - 10.3 mg/dL Final   Total Protein 81/19/1478 7.3  6.5 - 8.1 g/dL Final   Albumin 29/56/2130 3.4 (L)  3.5 - 5.0 g/dL Final   AST 86/57/8469 74 (H)  15 - 41 U/L Final   ALT 04/20/2022 41  0 - 44 U/L Final   Alkaline Phosphatase 04/20/2022 69  38 - 126 U/L Final   Total Bilirubin 04/20/2022 1.4 (H)  0.3 - 1.2 mg/dL Final   GFR,  Estimated 04/20/2022 >60  >60 mL/min Final   Comment: (NOTE) Calculated using the CKD-EPI Creatinine Equation (2021)    Anion gap 04/20/2022 16 (H)  5 - 15 Final   Performed at [facility], [address], [place], [place] [address]   Sodium 04/20/2022 135  135 - 145 mmol/L Final   Potassium 04/20/2022 4.2  3.5 - 5.1 mmol/L Final   Chloride 04/20/2022 103  98 - 111 mmol/L Final   BUN 04/20/2022 10  8 - 23 mg/dL Final   Creatinine, Ser 04/20/2022 1.30 (H)  0.61 - 1.24 mg/dL Final   Glucose, Bld 84/13/2440 93  70 - 99 mg/dL Final   Glucose reference range applies only to samples taken after fasting for at least 8 hours.   Calcium, Ion 04/20/2022 0.88 (LL)  1.15 - 1.40 mmol/L Final   TCO2 04/20/2022 21 (L)  22 - 32 mmol/L Final   Hemoglobin 04/20/2022 12.9 (L)  13.0 - 17.0 g/dL Final   HCT 08/13/2535 38.0 (L)  39.0 - 52.0 % Final   Comment 04/20/2022 NOTIFIED PHYSICIAN   Final   WBC 04/20/2022 6.9  4.0 - 10.5 K/uL Final   RBC 04/20/2022 4.91  4.22 - 5.81 MIL/uL Final   Hemoglobin 04/20/2022 11.1 (L)  13.0 - 17.0 g/dL Final   HCT 64/40/3474 38.0 (L)  39.0 - 52.0 % Final   MCV 04/20/2022 77.4 (L)  80.0 - 100.0 fL Final   MCH 04/20/2022 22.6 (L)  26.0 - 34.0 pg Final   MCHC 04/20/2022 29.2 (L)  30.0 - 36.0 g/dL Final   RDW 25/95/6387 21.0 (H)  11.5 - 15.5 % Final   Platelets 04/20/2022 139 (L)  150 - 400 K/uL Final   nRBC 04/20/2022 0.0  0.0 - 0.2 % Final   Performed at [facility], [address] [place], [place] [address]   Alcohol, Ethyl (B) 04/20/2022 262 (H)  <10 mg/dL Final   Comment: (NOTE) Lowest detectable limit for serum alcohol is 10 mg/dL.  For medical purposes only. Performed at [facility], [address] [place], [place] [address]    Color, Urine 04/20/2022 YELLOW  YELLOW Final   APPearance 04/20/2022 CLEAR  CLEAR Final   Specific Gravity, Urine 04/20/2022 1.008  1.005 - 1.030 Final   pH 04/20/2022 7.0  5.0 - 8.0 Final   Glucose, UA 04/20/2022  NEGATIVE  NEGATIVE mg/dL Final   Hgb urine dipstick 04/20/2022 NEGATIVE  NEGATIVE Final   Bilirubin Urine 04/20/2022 NEGATIVE  NEGATIVE Final   Ketones, ur 04/20/2022 NEGATIVE  NEGATIVE mg/dL Final   Protein, ur 84/16/6063 NEGATIVE  NEGATIVE mg/dL Final   Nitrite 01/60/1093 NEGATIVE  NEGATIVE Final  Leukocytes,Ua 04/20/2022 NEGATIVE  NEGATIVE Final   Performed at [facility], [address], [place], [place] [address]   Lactic Acid, Venous 04/20/2022 3.2 (HH)  0.5 - 1.9 mmol/L Final   Comment: CRITICAL RESULT CALLED TO, READ BACK BY AND VERIFIED WITH: M.RUGGIERO,RN @1959  04/20/2022 [email or web address] Performed at [facility], [address], [place], [place] [address]    Prothrombin Time 04/20/2022 14.1  11.4 - 15.2 seconds Final   INR 04/20/2022 1.1  0.8 - 1.2 Final   Comment: (NOTE) INR goal varies based on device and disease states. Performed at [facility], [address] [place], [place] [address]    Blood Bank Specimen 04/20/2022 SAMPLE AVAILABLE FOR TESTING   Final   Sample Expiration 04/20/2022    Final                   Value:04/21/2022,2359 Performed at [facility], [address], [place], [place] [address]    Sodium 04/21/2022 141  135 - 145 mmol/L Final   Potassium 04/21/2022 4.1  3.5 - 5.1 mmol/L Final   Chloride 04/21/2022 102  98 - 111 mmol/L Final   CO2 04/21/2022 25  22 - 32 mmol/L Final   Glucose, Bld 04/21/2022 104 (H)  70 - 99 mg/dL Final   Glucose reference range applies only to samples taken after fasting for at least 8 hours.   BUN 04/21/2022 12  8 - 23 mg/dL Final   Creatinine, Ser 04/21/2022 0.97  0.61 - 1.24 mg/dL Final   Calcium 29/56/2130 9.1  8.9 - 10.3 mg/dL Final   GFR, Estimated 04/21/2022 >60  >60 mL/min Final   Comment: (NOTE) Calculated using the CKD-EPI Creatinine Equation (2021)    Anion gap 04/21/2022 14  5 - 15 Final   Performed at [facility], [address], [place], [place] [address]   WBC 04/21/2022 5.8  4.0  - 10.5 K/uL Final   RBC 04/21/2022 3.94 (L)  4.22 - 5.81 MIL/uL Final   Hemoglobin 04/21/2022 9.1 (L)  13.0 - 17.0 g/dL Final   HCT 46/96/2952 29.3 (L)  39.0 - 52.0 % Final   MCV 04/21/2022 74.4 (L)  80.0 - 100.0 fL Final   MCH 04/21/2022 23.1 (L)  26.0 - 34.0 pg Final   MCHC 04/21/2022 31.1  30.0 - 36.0 g/dL Final   RDW 84/13/2440 20.3 (H)  11.5 - 15.5 % Final   Platelets 04/21/2022 99 (L)  150 - 400 K/uL Final   Comment: Immature Platelet Fraction may be clinically indicated, consider ordering this additional test [id number] REPEATED TO VERIFY PLATELET COUNT CONFIRMED BY SMEAR    nRBC 04/21/2022 0.0  0.0 - 0.2 % Final   Performed at [facility], [address], [place], [place] [address]   HIV Screen 4th Generation wRfx 04/21/2022 Non Reactive  Non Reactive Final   Performed at [facility], [address], [place], [place] [address]   Sodium 04/22/2022 138  135 - 145 mmol/L Final   Potassium 04/22/2022 3.7  3.5 - 5.1 mmol/L Final   Chloride 04/22/2022 103  98 - 111 mmol/L Final   CO2 04/22/2022 26  22 - 32 mmol/L Final   Glucose, Bld 04/22/2022 124 (H)  70 - 99 mg/dL Final   Glucose reference range applies only to samples taken after fasting for at least 8 hours.   BUN 04/22/2022 9  8 - 23 mg/dL Final  Creatinine, Ser 04/22/2022 0.90  0.61 - 1.24 mg/dL Final   Calcium 54/06/8118 8.6 (L)  8.9 - 10.3 mg/dL Final   GFR, Estimated 04/22/2022 >60  >60 mL/min Final   Comment: (NOTE) Calculated using the CKD-EPI Creatinine Equation (2021)    Anion gap 04/22/2022 9  5 - 15 Final   Performed at [facility], [address], [place], [place] [address]   WBC 04/22/2022 3.6 (L)  4.0 - 10.5 K/uL Final   RBC 04/22/2022 3.55 (L)  4.22 - 5.81 MIL/uL Final   Hemoglobin 04/22/2022 8.2 (L)  13.0 - 17.0 g/dL Final   Comment: Reticulocyte Hemoglobin testing may be clinically indicated, consider ordering this additional test [id number]    HCT 04/22/2022 26.6 (L)  39.0 - 52.0 %  Final   MCV 04/22/2022 74.9 (L)  80.0 - 100.0 fL Final   MCH 04/22/2022 23.1 (L)  26.0 - 34.0 pg Final   MCHC 04/22/2022 30.8  30.0 - 36.0 g/dL Final   RDW 65/78/4696 20.5 (H)  11.5 - 15.5 % Final   Platelets 04/22/2022 73 (L)  150 - 400 K/uL Final   Comment: Immature Platelet Fraction may be clinically indicated, consider ordering this additional test [id number] REPEATED TO VERIFY    nRBC 04/22/2022 0.0  0.0 - 0.2 % Final   Performed at [facility], [address], [place], [place] [address]   WBC 04/23/2022 3.7 (L)  4.0 - 10.5 K/uL Final   RBC 04/23/2022 3.61 (L)  4.22 - 5.81 MIL/uL Final   Hemoglobin 04/23/2022 8.4 (L)  13.0 - 17.0 g/dL Final   Comment: Reticulocyte Hemoglobin testing may be clinically indicated, consider ordering this additional test [id number]    HCT 04/23/2022 27.0 (L)  39.0 - 52.0 % Final   MCV 04/23/2022 74.8 (L)  80.0 - 100.0 fL Final   MCH 04/23/2022 23.3 (L)  26.0 - 34.0 pg Final   MCHC 04/23/2022 31.1  30.0 - 36.0 g/dL Final   RDW 44/12/4740 20.9 (H)  11.5 - 15.5 % Final   Platelets 04/23/2022 73 (L)  150 - 400 K/uL Final   Comment: Immature Platelet Fraction may be clinically indicated, consider ordering this additional test [id number] CONSISTENT WITH PREVIOUS RESULT REPEATED TO VERIFY    nRBC 04/23/2022 0.0  0.0 - 0.2 % Final   Performed at [facility], [address], [place], [place] [address]   WBC 04/26/2022 3.8 (L)  4.0 - 10.5 K/uL Final   RBC 04/26/2022 3.34 (L)  4.22 - 5.81 MIL/uL Final   Hemoglobin 04/26/2022 7.7 (L)  13.0 - 17.0 g/dL Final   Comment: Reticulocyte Hemoglobin testing may be clinically indicated, consider ordering this additional test [id number]    HCT 04/26/2022 25.1 (L)  39.0 - 52.0 % Final   MCV 04/26/2022 75.1 (L)  80.0 - 100.0 fL Final   MCH 04/26/2022 23.1 (L)  26.0 - 34.0 pg Final   MCHC 04/26/2022 30.7  30.0 - 36.0 g/dL Final   RDW 60/63/0160 20.3 (H)  11.5 - 15.5 % Final   Platelets 04/26/2022 98  (L)  150 - 400 K/uL Final   REPEATED TO VERIFY   nRBC 04/26/2022 0.0  0.0 - 0.2 % Final   Performed at [facility], [address] [place], [place] [address]   Sodium 04/26/2022 140  135 - 145 mmol/L Final   Potassium 04/26/2022 4.5  3.5 - 5.1 mmol/L Final   Chloride 04/26/2022 105  98 -  111 mmol/L Final   CO2 04/26/2022 28  22 - 32 mmol/L Final   Glucose, Bld 04/26/2022 101 (H)  70 - 99 mg/dL Final   Glucose reference range applies only to samples taken after fasting for at least 8 hours.   BUN 04/26/2022 12  8 - 23 mg/dL Final   Creatinine, Ser 04/26/2022 1.04  0.61 - 1.24 mg/dL Final   Calcium 96/29/5284 8.8 (L)  8.9 - 10.3 mg/dL Final   GFR, Estimated 04/26/2022 >60  >60 mL/min Final   Comment: (NOTE) Calculated using the CKD-EPI Creatinine Equation (2021)    Anion gap 04/26/2022 7  5 - 15 Final   Performed at [facility], [address], [place], [place] [address]   WBC 04/27/2022 3.8 (L)  4.0 - 10.5 K/uL Final   RBC 04/27/2022 3.59 (L)  4.22 - 5.81 MIL/uL Final   Hemoglobin 04/27/2022 8.2 (L)  13.0 - 17.0 g/dL Final   Comment: Reticulocyte Hemoglobin testing may be clinically indicated, consider ordering this additional test [id number]    HCT 04/27/2022 27.1 (L)  39.0 - 52.0 % Final   MCV 04/27/2022 75.5 (L)  80.0 - 100.0 fL Final   MCH 04/27/2022 22.8 (L)  26.0 - 34.0 pg Final   MCHC 04/27/2022 30.3  30.0 - 36.0 g/dL Final   RDW 66/44/0347 20.1 (H)  11.5 - 15.5 % Final   Platelets 04/27/2022 114 (L)  150 - 400 K/uL Final   REPEATED TO VERIFY   nRBC 04/27/2022 0.0  0.0 - 0.2 % Final   Performed at [facility], [address] [place], [place] [address]   Sodium 04/27/2022 139  135 - 145 mmol/L Final   Potassium 04/27/2022 4.2  3.5 - 5.1 mmol/L Final   Chloride 04/27/2022 108  98 - 111 mmol/L Final   CO2 04/27/2022 25  22 - 32 mmol/L Final   Glucose, Bld 04/27/2022 117 (H)  70 - 99 mg/dL Final   Glucose reference range applies only to samples taken  after fasting for at least 8 hours.   BUN 04/27/2022 9  8 - 23 mg/dL Final   Creatinine, Ser 04/27/2022 1.16  0.61 - 1.24 mg/dL Final   Calcium 63/87/5643 8.7 (L)  8.9 - 10.3 mg/dL Final   GFR, Estimated 04/27/2022 >60  >60 mL/min Final   Comment: (NOTE) Calculated using the CKD-EPI Creatinine Equation (2021)    Anion gap 04/27/2022 6  5 - 15 Final   Performed at [facility], [address] [place], [place] [address]   WBC 04/28/2022 4.5  4.0 - 10.5 K/uL Final   RBC 04/28/2022 3.39 (L)  4.22 - 5.81 MIL/uL Final   Hemoglobin 04/28/2022 7.7 (L)  13.0 - 17.0 g/dL Final   Comment: Reticulocyte Hemoglobin testing may be clinically indicated, consider ordering this additional test [id number]    HCT 04/28/2022 25.4 (L)  39.0 - 52.0 % Final   MCV 04/28/2022 74.9 (L)  80.0 - 100.0 fL Final   MCH 04/28/2022 22.7 (L)  26.0 - 34.0 pg Final   MCHC 04/28/2022 30.3  30.0 - 36.0 g/dL Final   RDW 30/16/0109 19.9 (H)  11.5 - 15.5 % Final   Platelets 04/28/2022 125 (L)  150 - 400 K/uL Final   REPEATED TO VERIFY   nRBC 04/28/2022 0.0  0.0 - 0.2 % Final   Performed at [facility], [address] [place], [place] [address]   WBC 04/29/2022 4.3  4.0 - 10.5 K/uL Final   RBC 04/29/2022 3.39 (L)  4.22 - 5.81 MIL/uL Final   Hemoglobin 04/29/2022 7.8 (L)  13.0 - 17.0 g/dL Final   Comment: Reticulocyte Hemoglobin testing may be clinically indicated, consider ordering this additional test [id number]    HCT 04/29/2022 25.1 (L)  39.0 - 52.0 % Final   MCV 04/29/2022 74.0 (L)  80.0 - 100.0 fL Final   MCH 04/29/2022 23.0 (L)  26.0 - 34.0 pg Final   MCHC 04/29/2022 31.1  30.0 - 36.0 g/dL Final   RDW 54/62/7035 19.9 (H)  11.5 - 15.5 % Final   Platelets 04/29/2022 125 (L)  150 - 400 K/uL Final   REPEATED TO VERIFY   nRBC 04/29/2022 0.0  0.0 - 0.2 % Final   Performed at [facility], [address], [place], [place] [address]   WBC 05/01/2022 3.6 (L)  4.0 - 10.5 K/uL Final   RBC 05/01/2022  3.13 (L)  4.22 - 5.81 MIL/uL Final   Hemoglobin 05/01/2022 7.1 (L)  13.0 - 17.0 g/dL Final   Comment: Reticulocyte Hemoglobin testing may be clinically indicated, consider ordering this additional test [id number]    HCT 05/01/2022 23.0 (L)  39.0 - 52.0 % Final   MCV 05/01/2022 73.5 (L)  80.0 - 100.0 fL Final   MCH 05/01/2022 22.7 (L)  26.0 - 34.0 pg Final   MCHC 05/01/2022 30.9  30.0 - 36.0 g/dL Final   RDW 69/67/8938 19.8 (H)  11.5 - 15.5 % Final   Platelets 05/01/2022 149 (L)  150 - 400 K/uL Final   REPEATED TO VERIFY   nRBC 05/01/2022 0.0  0.0 - 0.2 % Final   Performed at [facility], [address] [place], [place] [address]   Sodium 05/01/2022 137  135 - 145 mmol/L Final   Potassium 05/01/2022 4.0  3.5 - 5.1 mmol/L Final   Chloride 05/01/2022 109  98 - 111 mmol/L Final   CO2 05/01/2022 23  22 - 32 mmol/L Final   Glucose, Bld 05/01/2022 98  70 - 99 mg/dL Final   Glucose reference range applies only to samples taken after fasting for at least 8 hours.   BUN 05/01/2022 14  8 - 23 mg/dL Final   Creatinine, Ser 05/01/2022 0.83  0.61 - 1.24 mg/dL Final   Calcium 08/10/8526 8.4 (L)  8.9 - 10.3 mg/dL Final   GFR, Estimated 05/01/2022 >60  >60 mL/min Final   Comment: (NOTE) Calculated using the CKD-EPI Creatinine Equation (2021)    Anion gap 05/01/2022 5  5 - 15 Final   Performed at [facility], [address], [place], [place] [address]   WBC 05/02/2022 7.8  4.0 - 10.5 K/uL Final   RBC 05/02/2022 3.23 (L)  4.22 - 5.81 MIL/uL Final   Hemoglobin 05/02/2022 7.2 (L)  13.0 - 17.0 g/dL Final   Comment: Reticulocyte Hemoglobin testing may be clinically indicated, consider ordering this additional test [id number]    HCT 05/02/2022 23.6 (L)  39.0 - 52.0 % Final   MCV 05/02/2022 73.1 (L)  80.0 - 100.0 fL Final   MCH 05/02/2022 22.3 (L)  26.0 - 34.0 pg Final   MCHC 05/02/2022 30.5  30.0 - 36.0 g/dL Final   RDW 15/40/0867 19.5 (H)  11.5 - 15.5 % Final   Platelets 05/02/2022  153  150 - 400 K/uL Final   REPEATED TO VERIFY   nRBC 05/02/2022 0.0  0.0 - 0.2 % Final  Performed at [facility], [address], [place], [place] [address]  Admission on 03/31/2022, Discharged on 04/02/2022  Component Date Value Ref Range Status   Sodium 03/31/2022 140  135 - 145 mmol/L Final   Potassium 03/31/2022 3.6  3.5 - 5.1 mmol/L Final   Chloride 03/31/2022 108  98 - 111 mmol/L Final   CO2 03/31/2022 19 (L)  22 - 32 mmol/L Final   Glucose, Bld 03/31/2022 84  70 - 99 mg/dL Final   Glucose reference range applies only to samples taken after fasting for at least 8 hours.   BUN 03/31/2022 9  8 - 23 mg/dL Final   Creatinine, Ser 03/31/2022 0.86  0.61 - 1.24 mg/dL Final   Calcium 60/45/4098 8.6 (L)  8.9 - 10.3 mg/dL Final   Total Protein 11/91/4782 7.2  6.5 - 8.1 g/dL Final   Albumin 95/62/1308 3.5  3.5 - 5.0 g/dL Final   AST 65/78/4696 56 (H)  15 - 41 U/L Final   ALT 03/31/2022 35  0 - 44 U/L Final   Alkaline Phosphatase 03/31/2022 61  38 - 126 U/L Final   Total Bilirubin 03/31/2022 2.0 (H)  0.3 - 1.2 mg/dL Final   GFR, Estimated 03/31/2022 >60  >60 mL/min Final   Comment: (NOTE) Calculated using the CKD-EPI Creatinine Equation (2021)    Anion gap 03/31/2022 13  5 - 15 Final   Performed at [facility], [address] [place], [place] [address]   Alcohol, Ethyl (B) 03/31/2022 203 (H)  <10 mg/dL Final   Comment: (NOTE) Lowest detectable limit for serum alcohol is 10 mg/dL.  For medical purposes only. Performed at [facility], [address], [place], [place] [address]    Salicylate Lvl 03/31/2022 <7.0 (L)  7.0 - 30.0 mg/dL Final   Performed at [facility], [address] [place], [place] [address]   Acetaminophen (Tylenol), Serum 03/31/2022 <10 (L)  10 - 30 ug/mL Final   Comment: (NOTE) Therapeutic concentrations vary significantly. A range of 10-30 ug/mL  may be an effective concentration for many patients. However, some  are best treated at  concentrations outside of this range. Acetaminophen concentrations >150 ug/mL at 4 hours after ingestion  and >50 ug/mL at 12 hours after ingestion are often associated with  toxic reactions.  Performed at [facility], [address], [place], [place] [address]    WBC 03/31/2022 4.4  4.0 - 10.5 K/uL Final   RBC 03/31/2022 5.10  4.22 - 5.81 MIL/uL Final   Hemoglobin 03/31/2022 11.8 (L)  13.0 - 17.0 g/dL Final   HCT 64/40/3474 38.5 (L)  39.0 - 52.0 % Final   MCV 03/31/2022 75.5 (L)  80.0 - 100.0 fL Final   MCH 03/31/2022 23.1 (L)  26.0 - 34.0 pg Final   MCHC 03/31/2022 30.6  30.0 - 36.0 g/dL Final   RDW 25/95/6387 19.4 (H)  11.5 - 15.5 % Final   Platelets 03/31/2022 102 (L)  150 - 400 K/uL Final   Comment: Immature Platelet Fraction may be clinically indicated, consider ordering this additional test [id number] REPEATED TO VERIFY PLATELET COUNT CONFIRMED BY SMEAR    nRBC 03/31/2022 0.0  0.0 - 0.2 % Final   Performed at [facility], [address], [place], [place] [address]   Opiates 03/31/2022 NONE DETECTED  NONE DETECTED Final   Cocaine 03/31/2022 NONE DETECTED  NONE DETECTED Final   Benzodiazepines 03/31/2022 NONE DETECTED  NONE  DETECTED Final   Amphetamines 03/31/2022 NONE DETECTED  NONE DETECTED Final   Tetrahydrocannabinol 03/31/2022 NONE DETECTED  NONE DETECTED Final   Barbiturates 03/31/2022 NONE DETECTED  NONE DETECTED Final   Comment: (NOTE) DRUG SCREEN FOR MEDICAL PURPOSES ONLY.  IF CONFIRMATION IS NEEDED FOR ANY PURPOSE, NOTIFY LAB WITHIN 5 DAYS.  LOWEST DETECTABLE LIMITS FOR URINE DRUG SCREEN Drug Class                     Cutoff (ng/mL) Amphetamine and metabolites    1000 Barbiturate and metabolites    200 Benzodiazepine                 200 Tricyclics and metabolites     300 Opiates and metabolites        300 Cocaine and metabolites        300 THC                            50 Performed at [facility], [address] [place],  [place] [address]    SARS Coronavirus 2 by RT PCR 03/31/2022 NEGATIVE  NEGATIVE Final   Comment: (NOTE) SARS-CoV-2 target nucleic acids are NOT DETECTED.  The SARS-CoV-2 RNA is generally detectable in upper and lower respiratory specimens during the acute phase of infection. The lowest concentration of SARS-CoV-2 viral copies this assay can detect is 250 copies / mL. A negative result does not preclude SARS-CoV-2 infection and should not be used as the sole basis for treatment or other patient management decisions.  A negative result may occur with improper specimen collection / handling, submission of specimen other than nasopharyngeal swab, presence of viral mutation(s) within the areas targeted by this assay, and inadequate number of viral copies (<250 copies / mL). A negative result must be combined with clinical observations, patient history, and epidemiological information.  Fact Sheet for Patients:   RoadLapTop.co.za  Fact Sheet for Healthcare Providers: http://kim-miller.com/  This test is not yet approved or                           cleared by the [place] FDA and has been authorized for detection and/or diagnosis of SARS-CoV-2 by FDA under an Emergency Use Authorization (EUA).  This EUA will remain in effect (meaning this test can be used) for the duration of the COVID-19 declaration under Section 564(b)(1) of the Act, 21 U.S.C. section 360bbb-3(b)(1), unless the authorization is terminated or revoked sooner.  Performed at [facility], [address] [place], [place] [address]   Admission on 01/22/2022, Discharged on 01/22/2022  Component Date Value Ref Range Status   Lipase 01/22/2022 44  11 - 51 U/L Final   Performed at [facility], [address], [place], [place] [address]   Sodium 01/22/2022 138  135 - 145 mmol/L Final   Potassium 01/22/2022 3.4 (L)  3.5 - 5.1 mmol/L Final   Chloride  01/22/2022 105  98 - 111 mmol/L Final   CO2 01/22/2022 24  22 - 32 mmol/L Final   Glucose, Bld 01/22/2022 90  70 - 99 mg/dL Final   Glucose reference range applies only to samples taken after fasting for at least 8 hours.   BUN 01/22/2022 10  8 - 23 mg/dL Final   Creatinine, Ser 01/22/2022 0.87  0.61 - 1.24 mg/dL Final   Calcium 91/47/8295 8.9  8.9 - 10.3 mg/dL Final   Total Protein 40/98/1191 7.9  6.5 - 8.1 g/dL Final   Albumin 47/82/9562 3.7  3.5 - 5.0 g/dL Final   AST 13/05/6577 43 (H)  15 - 41 U/L Final   ALT 01/22/2022 31  0 - 44 U/L Final   Alkaline Phosphatase 01/22/2022 74  38 - 126 U/L Final   Total Bilirubin 01/22/2022 2.0 (H)  0.3 - 1.2 mg/dL Final   GFR, Estimated 01/22/2022 >60  >60 mL/min Final   Comment: (NOTE) Calculated using the CKD-EPI Creatinine Equation (2021)    Anion gap 01/22/2022 9  5 - 15 Final   Performed at [facility], [address], [place], [place] [address]   WBC 01/22/2022 4.7  4.0 - 10.5 K/uL Final   RBC 01/22/2022 5.84 (H)  4.22 - 5.81 MIL/uL Final   Hemoglobin 01/22/2022 13.1  13.0 - 17.0 g/dL Final   HCT 95/28/4132 43.9  39.0 - 52.0 % Final   MCV 01/22/2022 75.2 (L)  80.0 - 100.0 fL Final   MCH 01/22/2022 22.4 (L)  26.0 - 34.0 pg Final   MCHC 01/22/2022 29.8 (L)  30.0 - 36.0 g/dL Final   RDW 44/10/270 19.7 (H)  11.5 - 15.5 % Final   Platelets 01/22/2022 128 (L)  150 - 400 K/uL Final   nRBC 01/22/2022 0.0  0.0 - 0.2 % Final   Performed at [facility], [address], [place], [place] [address]   Color, Urine 01/22/2022 STRAW (A)  YELLOW Final   APPearance 01/22/2022 CLEAR  CLEAR Final   Specific Gravity, Urine 01/22/2022 1.002 (L)  1.005 - 1.030 Final   pH 01/22/2022 5.0  5.0 - 8.0 Final   Glucose, UA 01/22/2022 NEGATIVE  NEGATIVE mg/dL Final   Hgb urine dipstick 01/22/2022 NEGATIVE  NEGATIVE Final   Bilirubin Urine 01/22/2022 NEGATIVE  NEGATIVE Final   Ketones, ur 01/22/2022 NEGATIVE  NEGATIVE mg/dL  Final   Protein, ur 40/34/7425 NEGATIVE  NEGATIVE mg/dL Final   Nitrite 95/63/8756 NEGATIVE  NEGATIVE Final   Leukocytes,Ua 01/22/2022 NEGATIVE  NEGATIVE Final   Performed at [facility], [address] [place], [place] [address]   Opiates 01/22/2022 NONE DETECTED  NONE DETECTED Final   Cocaine 01/22/2022 NONE DETECTED  NONE DETECTED Final   Benzodiazepines 01/22/2022 NONE DETECTED  NONE DETECTED Final   Amphetamines 01/22/2022 NONE DETECTED  NONE DETECTED Final   Tetrahydrocannabinol 01/22/2022 NONE DETECTED  NONE DETECTED Final   Barbiturates 01/22/2022 NONE DETECTED  NONE DETECTED Final   Comment: (NOTE) DRUG SCREEN FOR MEDICAL PURPOSES ONLY.  IF CONFIRMATION IS NEEDED FOR ANY PURPOSE, NOTIFY LAB WITHIN 5 DAYS.  LOWEST DETECTABLE LIMITS FOR URINE DRUG SCREEN Drug Class                     Cutoff (ng/mL) Amphetamine and metabolites    1000 Barbiturate and metabolites    200 Benzodiazepine                 200 Tricyclics and metabolites     300 Opiates and metabolites        300 Cocaine and metabolites        300 THC                            50 Performed at [facility], [address] [place], [place] [address]    Alcohol,  Ethyl (B) 01/22/2022 134 (H)  <10 mg/dL Final   Comment: (NOTE) Lowest detectable limit for serum alcohol is 10 mg/dL.  For medical purposes only. Performed at [facility], [address], [place], [place] [address]    Specimen Description 01/22/2022    Final                   Value:URINE, CLEAN CATCH Performed at [facility], [address], [place], [place] [address]    Special Requests 01/22/2022    Final                   Value:NONE Performed at [facility], [address], [place], [place] [address]    Culture 01/22/2022    Final                   Value:NO GROWTH Performed at [facility], [address] [place], [place] [address]     Report Status 01/22/2022 01/23/2022 FINAL   Final   Ammonia 01/22/2022 18  9 - 35 umol/L Final   Performed at [facility], [address] [place] [address], [place], [place] [address]   Acetaminophen (Tylenol), Serum 01/22/2022 <10 (L)  10 - 30 ug/mL Final   Comment: (NOTE) Therapeutic concentrations vary significantly. A range of 10-30 ug/mL  may be an effective concentration for many patients. However, some  are best treated at concentrations outside of this range. Acetaminophen concentrations >150 ug/mL at 4 hours after ingestion  and >50 ug/mL at 12 hours after ingestion are often associated with  toxic reactions.  Performed at [facility], [address], [place], [place] [address]    Salicylate Lvl 01/22/2022 <7.0 (L)  7.0 - 30.0 mg/dL Final   Performed at [facility], [address], [place], [place] [address]  Admission on 01/10/2022, Discharged on 01/10/2022  Component Date Value Ref Range Status   Sodium 01/10/2022 131 (L)  135 - 145 mmol/L Final   Potassium 01/10/2022 4.0  3.5 - 5.1 mmol/L Final   Chloride 01/10/2022 98  98 - 111 mmol/L Final   CO2 01/10/2022 23  22 - 32 mmol/L Final   Glucose, Bld 01/10/2022 84  70 - 99 mg/dL Final   Glucose reference range applies only to samples taken after fasting for at least 8 hours.   BUN 01/10/2022 8  8 - 23 mg/dL Final   Creatinine, Ser 01/10/2022 0.76  0.61 - 1.24 mg/dL Final   Calcium 96/29/5284 9.1  8.9 - 10.3 mg/dL Final   Total Protein 13/24/4010 7.8  6.5 - 8.1 g/dL Final   Albumin 27/25/3664 3.8  3.5 - 5.0 g/dL Final   AST 40/34/7425 55 (H)  15 - 41 U/L Final   ALT 01/10/2022 36  0 - 44 U/L Final   Alkaline Phosphatase 01/10/2022 76  38 - 126 U/L Final   Total Bilirubin 01/10/2022 3.3 (H)  0.3 - 1.2 mg/dL Final   GFR, Estimated 01/10/2022 >60  >60 mL/min Final   Comment: (NOTE) Calculated using the CKD-EPI Creatinine Equation (2021)    Anion gap 01/10/2022 10  5 - 15 Final    Performed at [facility], [address], [place], [place] [address]   WBC 01/10/2022 5.7  4.0 - 10.5 K/uL Final   RBC 01/10/2022 5.16  4.22 - 5.81 MIL/uL Final   Hemoglobin 01/10/2022 11.8 (L)  13.0 - 17.0 g/dL Final  HCT 01/10/2022 39.0  39.0 - 52.0 % Final   MCV 01/10/2022 75.6 (L)  80.0 - 100.0 fL Final   MCH 01/10/2022 22.9 (L)  26.0 - 34.0 pg Final   MCHC 01/10/2022 30.3  30.0 - 36.0 g/dL Final   RDW 40/98/1191 18.4 (H)  11.5 - 15.5 % Final   Platelets 01/10/2022 138 (L)  150 - 400 K/uL Final   REPEATED TO VERIFY   nRBC 01/10/2022 0.0  0.0 - 0.2 % Final   Neutrophils Relative % 01/10/2022 61  % Final   Neutro Abs 01/10/2022 3.5  1.7 - 7.7 K/uL Final   Lymphocytes Relative 01/10/2022 27  % Final   Lymphs Abs 01/10/2022 1.5  0.7 - 4.0 K/uL Final   Monocytes Relative 01/10/2022 10  % Final   Monocytes Absolute 01/10/2022 0.6  0.1 - 1.0 K/uL Final   Eosinophils Relative 01/10/2022 1  % Final   Eosinophils Absolute 01/10/2022 0.0  0.0 - 0.5 K/uL Final   Basophils Relative 01/10/2022 1  % Final   Basophils Absolute 01/10/2022 0.1  0.0 - 0.1 K/uL Final   Immature Granulocytes 01/10/2022 0  % Final   Abs Immature Granulocytes 01/10/2022 0.02  0.00 - 0.07 K/uL Final   Performed at [facility], [address], [place], [place] [address]   Opiates 01/10/2022 NONE DETECTED  NONE DETECTED Final   Cocaine 01/10/2022 NONE DETECTED  NONE DETECTED Final   Benzodiazepines 01/10/2022 NONE DETECTED  NONE DETECTED Final   Amphetamines 01/10/2022 NONE DETECTED  NONE DETECTED Final   Tetrahydrocannabinol 01/10/2022 NONE DETECTED  NONE DETECTED Final   Barbiturates 01/10/2022 NONE DETECTED  NONE DETECTED Final   Comment: (NOTE) DRUG SCREEN FOR MEDICAL PURPOSES ONLY.  IF CONFIRMATION IS NEEDED FOR ANY PURPOSE, NOTIFY LAB WITHIN 5 DAYS.  LOWEST DETECTABLE LIMITS FOR URINE DRUG SCREEN Drug Class                     Cutoff (ng/mL) Amphetamine and metabolites    1000 Barbiturate and  metabolites    200 Benzodiazepine                 200 Tricyclics and metabolites     300 Opiates and metabolites        300 Cocaine and metabolites        300 THC                            50 Performed at [facility], [address] [place], [place] [address]    Color, Urine 01/10/2022 YELLOW  YELLOW Final   APPearance 01/10/2022 CLEAR  CLEAR Final   Specific Gravity, Urine 01/10/2022 1.003 (L)  1.005 - 1.030 Final   pH 01/10/2022 5.0  5.0 - 8.0 Final   Glucose, UA 01/10/2022 NEGATIVE  NEGATIVE mg/dL Final   Hgb urine dipstick 01/10/2022 SMALL (A)  NEGATIVE Final   Bilirubin Urine 01/10/2022 NEGATIVE  NEGATIVE Final   Ketones, ur 01/10/2022 5 (A)  NEGATIVE mg/dL Final   Protein, ur 08/65/7846 NEGATIVE  NEGATIVE mg/dL Final   Nitrite 96/29/5284 NEGATIVE  NEGATIVE Final   Leukocytes,Ua 01/10/2022 NEGATIVE  NEGATIVE Final   WBC, UA 01/10/2022 0-5  0 - 5 WBC/hpf Final   Bacteria, UA 01/10/2022 NONE SEEN  NONE SEEN Final   Squamous Epithelial / LPF 01/10/2022 0-5  0 - 5 Final   Performed at [facility], [address] [place], [place] [address]   Lipase 01/10/2022 37  11 - 51 U/L Final   Performed at [facility], [address], [place], [place] [address]   Total CK 01/10/2022 163  49 - 397 U/L Final   Performed at [facility], [address], [place], [place] [address]  Admission on 01/05/2022, Discharged on 01/06/2022  Component Date Value Ref Range Status   Sodium 01/05/2022 138  135 - 145 mmol/L Final   Potassium 01/05/2022 3.7  3.5 - 5.1 mmol/L Final   Chloride 01/05/2022 103  98 - 111 mmol/L Final   CO2 01/05/2022 23  22 - 32 mmol/L Final   Glucose, Bld 01/05/2022 89  70 - 99 mg/dL Final   Glucose reference range applies only to samples taken after fasting for at least 8 hours.   BUN 01/05/2022 <5 (L)  8 - 23 mg/dL Final   Creatinine, Ser 01/05/2022 0.89  0.61 - 1.24 mg/dL Final   Calcium 91/47/8295 9.1  8.9 - 10.3 mg/dL Final   Total Protein  01/05/2022 7.9  6.5 - 8.1 g/dL Final   Albumin 62/13/0865 3.8  3.5 - 5.0 g/dL Final   AST 78/46/9629 46 (H)  15 - 41 U/L Final   ALT 01/05/2022 33  0 - 44 U/L Final   Alkaline Phosphatase 01/05/2022 79  38 - 126 U/L Final   Total Bilirubin 01/05/2022 2.1 (H)  0.3 - 1.2 mg/dL Final   GFR, Estimated 01/05/2022 >60  >60 mL/min Final   Comment: (NOTE) Calculated using the CKD-EPI Creatinine Equation (2021)    Anion gap 01/05/2022 12  5 - 15 Final   Performed at [facility], [address] [place], [place] [address]   Alcohol, Ethyl (B) 01/05/2022 142 (H)  <10 mg/dL Final   Comment: (NOTE) Lowest detectable limit for serum alcohol is 10 mg/dL.  For medical purposes only. Performed at [facility], [address] [place], [place] [address]    WBC 01/05/2022 6.8  4.0 - 10.5 K/uL Final   RBC 01/05/2022 5.26  4.22 - 5.81 MIL/uL Final   Hemoglobin 01/05/2022 12.2 (L)  13.0 - 17.0 g/dL Final   HCT 08/13/2535 39.6  39.0 - 52.0 % Final   MCV 01/05/2022 75.3 (L)  80.0 - 100.0 fL Final   MCH 01/05/2022 23.2 (L)  26.0 - 34.0 pg Final   MCHC 01/05/2022 30.8  30.0 - 36.0 g/dL Final   RDW 64/40/3474 18.8 (H)  11.5 - 15.5 % Final   Platelets 01/05/2022 124 (L)  150 - 400 K/uL Final   REPEATED TO VERIFY   nRBC 01/05/2022 0.0  0.0 - 0.2 % Final   Performed at [facility], [address] [place], [place] [address]   Opiates 01/05/2022 NONE DETECTED  NONE DETECTED Final   Cocaine 01/05/2022 POSITIVE (A)  NONE DETECTED Final   Benzodiazepines 01/05/2022 NONE DETECTED  NONE DETECTED Final   Amphetamines 01/05/2022 NONE DETECTED  NONE DETECTED Final   Tetrahydrocannabinol 01/05/2022 NONE DETECTED  NONE DETECTED Final   Barbiturates 01/05/2022 NONE DETECTED  NONE DETECTED Final   Comment: (NOTE) DRUG SCREEN FOR MEDICAL PURPOSES ONLY.  IF CONFIRMATION IS NEEDED FOR ANY PURPOSE, NOTIFY LAB WITHIN 5 DAYS.  LOWEST DETECTABLE LIMITS FOR URINE DRUG SCREEN Drug Class                      Cutoff (ng/mL) Amphetamine and metabolites  1000 Barbiturate and metabolites    200 Benzodiazepine                 200 Tricyclics and metabolites     300 Opiates and metabolites        300 Cocaine and metabolites        300 THC                            50 Performed at [facility], [address] [place], [place] [address]    SARS Coronavirus 2 by RT PCR 01/05/2022 NEGATIVE  NEGATIVE Final   Comment: (NOTE) SARS-CoV-2 target nucleic acids are NOT DETECTED.  The SARS-CoV-2 RNA is generally detectable in upper respiratory specimens during the acute phase of infection. The lowest concentration of SARS-CoV-2 viral copies this assay can detect is 138 copies/mL. A negative result does not preclude SARS-Cov-2 infection and should not be used as the sole basis for treatment or other patient management decisions. A negative result may occur with  improper specimen collection/handling, submission of specimen other than nasopharyngeal swab, presence of viral mutation(s) within the areas targeted by this assay, and inadequate number of viral copies(<138 copies/mL). A negative result must be combined with clinical observations, patient history, and epidemiological information. The expected result is Negative.  Fact Sheet for Patients:  BloggerCourse.com  Fact Sheet for Healthcare Providers:  SeriousBroker.it  This test is no                          t yet approved or cleared by the [place] FDA and  has been authorized for detection and/or diagnosis of SARS-CoV-2 by FDA under an Emergency Use Authorization (EUA). This EUA will remain  in effect (meaning this test can be used) for the duration of the COVID-19 declaration under Section 564(b)(1) of the Act, 21 U.S.C.section 360bbb-3(b)(1), unless the authorization is terminated  or revoked sooner.       Influenza A by PCR 01/05/2022 NEGATIVE  NEGATIVE Final    Influenza B by PCR 01/05/2022 NEGATIVE  NEGATIVE Final   Comment: (NOTE) The Xpert Xpress SARS-CoV-2/FLU/RSV plus assay is intended as an aid in the diagnosis of influenza from Nasopharyngeal swab specimens and should not be used as a sole basis for treatment. Nasal washings and aspirates are unacceptable for Xpert Xpress SARS-CoV-2/FLU/RSV testing.  Fact Sheet for Patients: BloggerCourse.com  Fact Sheet for Healthcare Providers: SeriousBroker.it  This test is not yet approved or cleared by the [place] FDA and has been authorized for detection and/or diagnosis of SARS-CoV-2 by FDA under an Emergency Use Authorization (EUA). This EUA will remain in effect (meaning this test can be used) for the duration of the COVID-19 declaration under Section 564(b)(1) of the Act, 21 U.S.C. section 360bbb-3(b)(1), unless the authorization is terminated or revoked.  Performed at [facility], [address], [place], [place] [address]   There may be more visits with results that are not included.    Allergies: Carrot [daucus carota]  PTA Medications: (Not in a hospital admission)   Long Term Goals: Improvement in symptoms so as ready for discharge  Short Term Goals: Patient will verbalize feelings in meetings with treatment team members., Patient will attend at least of 50% of the groups daily., Pt will complete the PHQ9 on admission, day 3 and discharge., Patient will participate in completing the [place] Suicide Severity Rating Scale,  Patient will score a low risk of violence for 24 hours prior to discharge, and Patient will take medications as prescribed daily.  Medical Decision Making   Status: Voluntary admission to the [facility]  Schizoaffective disorder - Continue home Abilify 7.5 mg  Alcohol use disorder - Ativan taper with CIWA and as needed Ativan - Increase home gabapentin from 300 mg 3 times daily to 400 mg 3 times  daily - Thiamine  Cirrhosis - Lactulose as needed for constipation  Labs unremarkable except for: Hgb of 9, stable Plts of 148  Dispo: likely residential rehab     Recommendations  Based on my evaluation the patient does not appear to have an emergency medical condition.  Carlyn Reichert, MD 05/30/22  10:45 AM

## 2022-05-30 NOTE — ED Notes (Signed)
Pt was transferred to [facility].  PHQ-9 and vol consent signed.  Ryta RN aware.
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
## 2022-05-31 DIAGNOSIS — F250 Schizoaffective disorder, bipolar type: Secondary | ICD-10-CM

## 2022-05-31 LAB — T4, FREE: Free T4: 0.81 ng/dL (ref 0.61–1.12)

## 2022-05-31 LAB — IRON AND TIBC
Iron: 21 ug/dL — ABNORMAL LOW (ref 45–182)
Saturation Ratios: 4 % — ABNORMAL LOW (ref 17.9–39.5)
TIBC: 553 ug/dL — ABNORMAL HIGH (ref 250–450)
UIBC: 532 ug/dL

## 2022-05-31 MED ORDER — QUETIAPINE FUMARATE 100 MG PO TABS
100.0000 mg | ORAL_TABLET | Freq: Every day | ORAL | Status: DC
  Administered 2022-05-31: 100 mg via ORAL
  Filled 2022-05-31: qty 1

## 2022-05-31 NOTE — ED Notes (Signed)
Pt is in the dinning room watching TV. Respirations are even and unlabored. No acute distress noted. Will continue to monitor for safety. 
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
## 2022-05-31 NOTE — ED Notes (Signed)
Patient awake and alert on unit.  Anxious and pacing.  Asking about medication repeatedly.  Patient offered breakfast and ate a small amount.  Patient is without distress or withdrawal.  Will monitor and provide a safe environment.

## 2022-05-31 NOTE — BH Assessment (Signed)
LCSW Update Note   LCSW met with [name] for introduction and to begin discussions regarding treatment and potential discharge planning.   [name] presented with a dysphoric affect, congruent mood however he was pleasant and cooperative with providing information. [name] denied having any SI, HI or AVH at this time.   [name] shared that he came "back" to the [facility] seeking assistance for his ongoing substance abuse issues and suicidal ideation with a plan to overdose.   According to [name]'s initial CCA note, "[name] is a 66 year old male presenting as a voluntary walk-in to [facility] due to [facility] with plan to overdose on drugs. Per chart, patient has psychiatric history of schizoaffective disorder Bipolar type, MDD, alcohol intoxication  without complications, cocaine use disorder, chronic alcoholism, substance induced mood disorder, MDD severe, without psychotic features. Patient reported transportation gave him a ride to somewhere and dropped him off at the wrong place, therefore he never received any help after he was discharged from [facility] on 05/25/22. Patient reported being released from prison 11/2021 after being incarcerated for 11 years. Patient states, "I don't know how to live out here since I been released". Patient is currently homeless. Patient reported being paranoid and feeling that "people, all are trying to hurt me". Patient denied auditory and reported visual hallucinations of seeing shadows. Patient using alcohol, 6-7 40 ounce beers daily. Patient reported using $60.00 worth of cocaine daily. Patient reported using heroin, $400.00 this week. Patient reported he is now out of money. Patient unable to contract for safety. Patient was irritable and cooperative during assessment".   [name] expressed interest in participating in residential substance abuse treatment. LCSW explained the referral process to [facility] and he was agreeable.   LCSW will continue to follow  for possible residential treatment placement.     Radonna Ricker, MSW, LCSW Clinical Education officer, museum ([place]) [facility]

## 2022-05-31 NOTE — ED Notes (Signed)
Pt attending AA group meeting 

## 2022-05-31 NOTE — Progress Notes (Signed)
Patient is awake and alert on unit.  Calm, pleasant, organized and logical.  Patient without distress or complaint.  He asked for his glasses and same were retrieved for him from his locker.  Patient is presently watching tv in the day room.  Will monitor and provide safe supportive environment.

## 2022-05-31 NOTE — ED Notes (Signed)
Pt is in the bed sleeping. Respirations are even and unlabored. No acute distress noted. Will continue to monitor safety.

## 2022-05-31 NOTE — ED Notes (Signed)
Pt is in the bed sleeping. Respirations ar even and unlabored. No acute distress noted. Will continue to monitor for safety.

## 2022-05-31 NOTE — Progress Notes (Signed)
Encouraged patient to attend a nursing group however he stated he preferred to rest.

## 2022-06-01 ENCOUNTER — Encounter (HOSPITAL_COMMUNITY): Payer: Self-pay

## 2022-06-01 ENCOUNTER — Encounter (HOSPITAL_COMMUNITY): Payer: Self-pay | Admitting: Student

## 2022-06-01 DIAGNOSIS — F250 Schizoaffective disorder, bipolar type: Secondary | ICD-10-CM | POA: Diagnosis not present

## 2022-06-01 LAB — LIPID PANEL
Cholesterol: 140 mg/dL (ref 0–200)
HDL: 54 mg/dL (ref >40)
LDL Cholesterol: 68 mg/dL (ref 0–99)
Total CHOL/HDL Ratio: 2.6 RATIO
Triglycerides: 91 mg/dL (ref <150)
VLDL: 18 mg/dL (ref 0–40)

## 2022-06-01 MED ORDER — SALINE SPRAY 0.65 % NA SOLN
1.0000 | Freq: Every day | NASAL | Status: DC
  Administered 2022-06-01: 1 via NASAL
  Filled 2022-06-01: qty 44

## 2022-06-01 MED ORDER — QUETIAPINE FUMARATE 200 MG PO TABS: 200.0000 mg | ORAL_TABLET | Freq: Every day | ORAL | Status: DC

## 2022-06-01 MED ORDER — SALINE SPRAY 0.65 % NA SOLN: 1.0000 | Freq: Every day | NASAL | 0 refills | Status: DC

## 2022-06-01 MED ORDER — AMLODIPINE BESYLATE 5 MG PO TABS: 5.0000 mg | ORAL_TABLET | Freq: Every day | ORAL | 0 refills | Status: DC

## 2022-06-01 MED ORDER — POLYVINYL ALCOHOL 1.4 % OP SOLN: 2.0000 [drp] | OPHTHALMIC | 0 refills | Status: DC | PRN

## 2022-06-01 MED ORDER — SENNOSIDES-DOCUSATE SODIUM 8.6-50 MG PO TABS: 1.0000 | ORAL_TABLET | Freq: Every day | ORAL | Status: DC

## 2022-06-01 MED ORDER — FERROUS SULFATE 325 (65 FE) MG PO TABS: 325.0000 mg | ORAL_TABLET | Freq: Every day | ORAL | 0 refills | Status: DC

## 2022-06-01 MED ORDER — FERROUS SULFATE 325 (65 FE) MG PO TABS
325.0000 mg | ORAL_TABLET | Freq: Every day | ORAL | Status: DC
  Administered 2022-06-01: 325 mg via ORAL
  Filled 2022-06-01: qty 1

## 2022-06-01 MED ORDER — POLYETHYLENE GLYCOL 3350 17 G PO PACK
17.0000 g | PACK | Freq: Every day | ORAL | Status: DC
  Administered 2022-06-01: 17 g via ORAL
  Filled 2022-06-01: qty 1

## 2022-06-01 MED ORDER — GABAPENTIN 400 MG PO CAPS: 400.0000 mg | ORAL_CAPSULE | Freq: Three times a day (TID) | ORAL | 0 refills | Status: DC

## 2022-06-01 MED ORDER — QUETIAPINE FUMARATE 200 MG PO TABS: 200.0000 mg | ORAL_TABLET | Freq: Every day | ORAL | 0 refills | Status: DC

## 2022-06-01 NOTE — Clinical Social Work Note (Signed)
LCSW Update Note  [name] reported feeling "alright" this morning. [name] shared that he was in a better mood, however he felt "stopped up" and requested treatment for congestion. [name] denied having any additional physical complaints at this time.   [name] denied having any SI, HI or AVH.   LCSW updated [name] on the search for possible residential treatment services. [name] currently has Medicare A and B and is requesting residential substance abuse treatment. LCSW has contacted appropriate facilities for review:  [facility] - declined; recommended dual diagnosis facility [facility] - no beds  [facility] - no beds  [facility] - no beds  [facility] - no beds   [facility] shared that they will send the patient's information to [facility] in [place], [place] for possible review, as they are "sister facilities". LCSW will continue to follow up for possible placement.   [name] also provided his contact information for his ACTT provider, [facility] [phone]). LCSW contacted [facility] to determine if there were any leads regarding placement. [facility] receptionist transferred LCSW to [facility] [place] [phone]). LCSW has to leave a voicemail requesting a call back.   [name] continues to express interest in residential treatment services. He reports he is currently homeless and was staying in a tent. [name] shared that he does not have any family or friend supports at this time.   [name] expressed concerns regarding being discharged before having placement. [name] continuously mentioned "yall cant let me go, I have no where to go if I leave here".   LCSW explained to [name] that if he no longer met criteria for the Facility Based Crisis unit, that providers could not allow him to remain on the unit solely for housing purposes. [name] stated that he wanted to speak with the doctor, and that he wanted me to continue to seek possible  placement.   [name] denied having any additional questions or concerns at this time.   LCSW will continue to follow.   Radonna Ricker, MSW, LCSW Clinical Education officer, museum ([place]) [facility]

## 2022-06-01 NOTE — ED Notes (Signed)
Pt approached nurses station requesting "something to take the edge off. I feel gittery on the inside. Can I have one of those pills I got earlier. That's what they usually give me" (Ativan). Informed pt that he don't meet the parameters for that drug but I can give him Vistaril for anxiety. Pt received Vistaril without difficulty. Will continue to monitor for safety.

## 2022-06-01 NOTE — ED Notes (Signed)
Pt approached nurses station asking to discharge today instead of waiting for tomorrow. Pt states, "If I don't get in that place tomorrow, I can just come back here, can't I". RN informed pt that he have to go through another walk-in assessment and meet criteria for admission. Pt states, "I'll just get drunk and come back in". RN informed pt that appearing drunk don't necessarily mean admission to our unit. Pt states, "I know what to say to get admitted. This ain't my first rodeo". RN informed provider and SW via secure chat of pt request and statements.

## 2022-06-01 NOTE — BH IP Treatment Plan (Signed)
Interdisciplinary Treatment and Diagnostic Plan Update  06/01/2022 Time of Session: 10:00AM  [name] MRN: [id number]  Diagnosis:  Final diagnoses:  Schizoaffective disorder, bipolar type (HCC)  Alcohol use disorder  Polysubstance abuse (HCC)  Suicidal ideation     Current Medications:  Current Facility-Administered Medications  Medication Dose Route Frequency Provider Last Rate Last Admin   acetaminophen (TYLENOL) tablet 650 mg  650 mg Oral Q6H PRN Ajibola, Ene A, NP       alum & mag hydroxide-simeth (MAALOX/MYLANTA) 200-200-20 MG/5ML suspension 30 mL  30 mL Oral Q4H PRN Ajibola, Ene A, NP       amLODipine (NORVASC) tablet 5 mg  5 mg Oral Daily Park Pope, MD   5 mg at 05/31/22 5035   ferrous sulfate tablet 325 mg  325 mg Oral Q breakfast Park Pope, MD       gabapentin (NEURONTIN) capsule 400 mg  400 mg Oral TID Carlyn Reichert, MD   400 mg at 05/31/22 2103   hydrOXYzine (ATARAX) tablet 25 mg  25 mg Oral Q6H PRN Ajibola, Ene A, NP   25 mg at 05/29/22 2122   lactulose (encephalopathy) (CHRONULAC) 10 GM/15ML solution 20 g  20 g Oral Daily PRN Ajibola, Ene A, NP       loperamide (IMODIUM) capsule 2-4 mg  2-4 mg Oral PRN Ajibola, Ene A, NP       LORazepam (ATIVAN) tablet 1 mg  1 mg Oral Q6H PRN Ajibola, Ene A, NP       LORazepam (ATIVAN) tablet 1 mg  1 mg Oral BID Carlyn Reichert, MD       Followed by   Melene Muller ON 06/02/2022] LORazepam (ATIVAN) tablet 1 mg  1 mg Oral Daily Carlyn Reichert, MD       magnesium hydroxide (MILK OF MAGNESIA) suspension 30 mL  30 mL Oral Daily PRN Ajibola, Ene A, NP       multivitamin with minerals tablet 1 tablet  1 tablet Oral Daily Carlyn Reichert, MD   1 tablet at 05/31/22 0923   ondansetron (ZOFRAN-ODT) disintegrating tablet 4 mg  4 mg Oral Q6H PRN Ajibola, Ene A, NP       polyethylene glycol (MIRALAX / GLYCOLAX) packet 17 g  17 g Oral Daily Park Pope, MD       polyvinyl alcohol (LIQUIFILM TEARS) 1.4 % ophthalmic solution 1 drop  1 drop Both Eyes  PRN Carlyn Reichert, MD   1 drop at 05/31/22 2105   QUEtiapine (SEROQUEL) tablet 200 mg  200 mg Oral Seabron Spates, MD       senna-docusate (Senokot-S) tablet 1 tablet  1 tablet Oral QHS Park Pope, MD       thiamine (VITAMIN B1) tablet 100 mg  100 mg Oral Daily Carlyn Reichert, MD   100 mg at 05/31/22 4656   traZODone (DESYREL) tablet 50 mg  50 mg Oral QHS PRN Ajibola, Ene A, NP   50 mg at 05/29/22 2122   Current Outpatient Medications  Medication Sig Dispense Refill   ARIPiprazole (ABILIFY) 15 MG tablet Take 0.5 tablets (7.5 mg total) by mouth daily.     gabapentin (NEURONTIN) 600 MG tablet Take 0.5 tablets (300 mg total) by mouth 3 (three) times daily. 45 tablet 0   polyvinyl alcohol (LIQUIFILM TEARS) 1.4 % ophthalmic solution Place 2 drops into both eyes as needed for dry eyes. 15 mL 0   PTA Medications: Prior to Admission medications   Medication Sig Start Date End Date  Taking? Authorizing Provider  ARIPiprazole (ABILIFY) 15 MG tablet Take 0.5 tablets (7.5 mg total) by mouth daily. 05/06/22  Yes Park Pope, MD  gabapentin (NEURONTIN) 600 MG tablet Take 0.5 tablets (300 mg total) by mouth 3 (three) times daily. 05/03/22 06/02/22 Yes Simaan, Francine Graven, PA-C  polyvinyl alcohol (LIQUIFILM TEARS) 1.4 % ophthalmic solution Place 2 drops into both eyes as needed for dry eyes. 05/03/22  Yes Adam Phenix, PA-C    Patient Stressors: Financial difficulties   Medication change or noncompliance   Substance abuse    Patient Strengths: Motivation for treatment/growth   Treatment Modalities: Medication Management, Group therapy, Case management,  1 to 1 session with clinician, Psychoeducation, Recreational therapy.   Physician Treatment Plan for Primary and Secondary Diagnosis:  Final diagnoses:  Schizoaffective disorder, bipolar type (HCC)  Alcohol use disorder  Polysubstance abuse (HCC)  Suicidal ideation   Long Term Goal(s): Improvement in symptoms so as ready for  discharge  Short Term Goals: Patient will verbalize feelings in meetings with treatment team members. Patient will attend at least of 50% of the groups daily. Pt will complete the PHQ9 on admission, day 3 and discharge. Patient will participate in completing the [place] Suicide Severity Rating Scale Patient will score a low risk of violence for 24 hours prior to discharge Patient will take medications as prescribed daily.  Medication Management: Evaluate patient's response, side effects, and tolerance of medication regimen.  Therapeutic Interventions: 1 to 1 sessions, Unit Group sessions and Medication administration.  Evaluation of Outcomes: Progressing  LCSW Treatment Plan for Primary Diagnosis:  Final diagnoses:  Schizoaffective disorder, bipolar type (HCC)  Alcohol use disorder  Polysubstance abuse (HCC)  Suicidal ideation    Long Term Goal(s): Safe transition to appropriate next level of care at discharge.  Short Term Goals: Facilitate acceptance of mental health diagnosis and concerns through verbal commitment to aftercare plan and appointments at discharge. and Identify minimum of 2 triggers associated with mental health/substance abuse issues with treatment team members.  Therapeutic Interventions: Assess for all discharge needs, 1 to 1 time with Child psychotherapist, Explore available resources and support systems, Assess for adequacy in community support network, Educate family and significant other(s) on suicide prevention, Complete Psychosocial Assessment, Interpersonal group therapy.  Evaluation of Outcomes: Progressing   Progress in Treatment: Attending groups: Yes. Participating in groups: Yes. Taking medication as prescribed: Yes. Toleration medication: Yes. Family/Significant other contact made: No, will contact:  no one at this time Patient understands diagnosis: Yes. Discussing patient identified problems/goals with staff: Yes. Medical problems stabilized or  resolved: Yes. Denies suicidal/homicidal ideation: Yes. Issues/concerns per patient self-inventory: No. Other: None   New problem(s) identified: No, Describe:  None   New Short Term/Long Term Goal(s): [name] reports his long term goal is to participate in a long term residential program.   Patient Goals: "to get off of alcohol and drugs"    Discharge Plan or Barriers: [name] has expressed interest in residential treatment services following his completion of detox services on the [facility]. LCSW will continue to explore possible placement options.   Reason for Continuation of Hospitalization: Medication stabilization  Estimated Length of Stay: 3-5 days   Last 3 [place] Suicide Severity Risk Score: Flowsheet Row ED from 05/29/2022 in [facility] ED from 05/24/2022 in [facility] ED from 05/10/2022 in [facility]  C-SSRS RISK CATEGORY No Risk High Risk High Risk       Last PHQ  2/9 Scores:    01/12/2022    9:47 AM 01/05/2022    4:55 PM  Depression screen PHQ 2/9  Decreased Interest 1 2  Down, Depressed, Hopeless 1 3  PHQ - 2 Score 2 5  Altered sleeping 1 3  Tired, decreased energy 1 3  Change in appetite 0 3  Feeling bad or failure about yourself  1 3  Trouble concentrating 1 2  Moving slowly or fidgety/restless 0 2  Suicidal thoughts 1 3  PHQ-9 Score 7 24  Difficult doing work/chores Somewhat difficult Very difficult    Scribe for Treatment Team: Maeola Sarah, LCSW 06/01/2022 9:06 AM
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
## 2022-06-01 NOTE — Discharge Instructions (Signed)
Take all medications as prescribed by his/her mental healthcare provider. ?Report any adverse effects and or reactions from the medicines to your outpatient provider promptly. ?Do not engage in alcohol and or illegal drug use while on prescription medicines. ?In the event of worsening symptoms, call the crisis hotline, 911 and or go to the nearest ED for appropriate evaluation and treatment of symptoms. ?follow-up with your primary care provider for your other medical issues, concerns and or health care needs. ? ?

## 2022-06-01 NOTE — ED Notes (Signed)
Pt sleeping in no acute distress. RR even and unlabored. Safety maintained. 
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
## 2022-06-01 NOTE — ED Notes (Signed)
Pt calm, cooperative with staff. Denies concerns at present. Denies SI/HI/AVH. Pt states, "I just want some help with placement. I need somewhere to go so I don't relapse". Support given and assured pt that staff was working diligently to find somewhere for pt to receive the help needed for his addiction. Pt appreciative. Informed pt to notify staff with any needs or concerns. Will continue to monitor for safety.

## 2022-06-01 NOTE — ED Notes (Signed)
Patient A&O x 4, ambulatory. Patient discharged in no acute distress. Patient denied SI/HI, A/VH upon discharge. Patient verbalized understanding of all discharge instructions explained by staff, to include follow up appointments, RX's and safety plan. Pt belongings returned to patient from locker #25 intact. Patient escorted to lobby via staff with bus pass to aide with transportation. Safety maintained.

## 2022-06-02 LAB — T3, FREE: T3, Free: 2.5 pg/mL (ref 2.0–4.4)

## 2022-06-10 ENCOUNTER — Other Ambulatory Visit (HOSPITAL_COMMUNITY)
Admission: EM | Admit: 2022-06-10 | Discharge: 2022-06-15 | Disposition: A | Discharge status: Home / Self Care | Payer: Medicare Other | Attending: Psychiatry | Admitting: Psychiatry

## 2022-06-10 DIAGNOSIS — F10139 Alcohol abuse with withdrawal, unspecified: Secondary | ICD-10-CM | POA: Diagnosis present

## 2022-06-10 DIAGNOSIS — F10939 Alcohol use, unspecified with withdrawal, unspecified: Secondary | ICD-10-CM | POA: Diagnosis present

## 2022-06-10 DIAGNOSIS — D509 Iron deficiency anemia, unspecified: Secondary | ICD-10-CM | POA: Diagnosis not present

## 2022-06-10 DIAGNOSIS — Z79899 Other long term (current) drug therapy: Secondary | ICD-10-CM | POA: Diagnosis not present

## 2022-06-10 DIAGNOSIS — Z20822 Contact with and (suspected) exposure to covid-19: Secondary | ICD-10-CM | POA: Diagnosis not present

## 2022-06-10 DIAGNOSIS — F1090 Alcohol use, unspecified, uncomplicated: Secondary | ICD-10-CM

## 2022-06-10 DIAGNOSIS — D696 Thrombocytopenia, unspecified: Secondary | ICD-10-CM | POA: Insufficient documentation

## 2022-06-10 DIAGNOSIS — F1994 Other psychoactive substance use, unspecified with psychoactive substance-induced mood disorder: Secondary | ICD-10-CM | POA: Diagnosis not present

## 2022-06-10 DIAGNOSIS — R45851 Suicidal ideations: Secondary | ICD-10-CM | POA: Diagnosis not present

## 2022-06-10 DIAGNOSIS — K7460 Unspecified cirrhosis of liver: Secondary | ICD-10-CM | POA: Insufficient documentation

## 2022-06-10 DIAGNOSIS — F1020 Alcohol dependence, uncomplicated: Secondary | ICD-10-CM | POA: Insufficient documentation

## 2022-06-10 LAB — POCT URINE DRUG SCREEN - MANUAL ENTRY (I-SCREEN)
POC Amphetamine UR: NOT DETECTED
POC Buprenorphine (BUP): NOT DETECTED
POC Cocaine UR: NOT DETECTED
POC Marijuana UR: NOT DETECTED
POC Methadone UR: NOT DETECTED
POC Methamphetamine UR: NOT DETECTED
POC Morphine: NOT DETECTED
POC Oxazepam (BZO): NOT DETECTED
POC Oxycodone UR: NOT DETECTED
POC Secobarbital (BAR): NOT DETECTED

## 2022-06-10 LAB — RESP PANEL BY RT-PCR (FLU A&B, COVID) ARPGX2
Influenza A by PCR: NEGATIVE
Influenza B by PCR: NEGATIVE
SARS Coronavirus 2 by RT PCR: NEGATIVE

## 2022-06-10 LAB — POC SARS CORONAVIRUS 2 AG: SARSCOV2ONAVIRUS 2 AG: NEGATIVE

## 2022-06-10 MED ORDER — LORAZEPAM 1 MG PO TABS
1.0000 mg | ORAL_TABLET | Freq: Four times a day (QID) | ORAL | Status: AC | PRN
  Administered 2022-06-10: 1 mg via ORAL
  Filled 2022-06-10 (×2): qty 1

## 2022-06-10 MED ORDER — ONDANSETRON 4 MG PO TBDP: 4.0000 mg | ORAL_TABLET | Freq: Four times a day (QID) | ORAL | Status: AC | PRN

## 2022-06-10 MED ORDER — THIAMINE HCL 100 MG/ML IJ SOLN
100.0000 mg | Freq: Once | INTRAMUSCULAR | Status: AC
  Administered 2022-06-10: 100 mg via INTRAMUSCULAR
  Filled 2022-06-10: qty 2

## 2022-06-10 MED ORDER — MAGNESIUM HYDROXIDE 400 MG/5ML PO SUSP: 30.0000 mL | Freq: Every day | ORAL | Status: DC | PRN

## 2022-06-10 MED ORDER — ALUM & MAG HYDROXIDE-SIMETH 200-200-20 MG/5ML PO SUSP: 30.0000 mL | ORAL | Status: DC | PRN

## 2022-06-10 MED ORDER — ADULT MULTIVITAMIN W/MINERALS CH
1.0000 | ORAL_TABLET | Freq: Every day | ORAL | Status: DC
  Administered 2022-06-10 – 2022-06-15 (×6): 1 via ORAL
  Filled 2022-06-10 (×6): qty 1

## 2022-06-10 MED ORDER — AMLODIPINE BESYLATE 5 MG PO TABS
5.0000 mg | ORAL_TABLET | Freq: Every day | ORAL | Status: DC
Start: 2022-06-10 — End: 2022-06-13
  Administered 2022-06-10 – 2022-06-12 (×3): 5 mg via ORAL
  Filled 2022-06-10 (×3): qty 1

## 2022-06-10 MED ORDER — FERROUS SULFATE 325 (65 FE) MG PO TABS
325.0000 mg | ORAL_TABLET | Freq: Every day | ORAL | Status: DC
  Administered 2022-06-11 – 2022-06-15 (×5): 325 mg via ORAL
  Filled 2022-06-10 (×5): qty 1
  Filled 2022-06-10: qty 7

## 2022-06-10 MED ORDER — LOPERAMIDE HCL 2 MG PO CAPS: 2.0000 mg | ORAL_CAPSULE | ORAL | Status: AC | PRN

## 2022-06-10 MED ORDER — HYDROXYZINE HCL 25 MG PO TABS
25.0000 mg | ORAL_TABLET | Freq: Three times a day (TID) | ORAL | Status: DC | PRN
  Administered 2022-06-11 (×2): 25 mg via ORAL
  Filled 2022-06-10 (×4): qty 1

## 2022-06-10 MED ORDER — ACETAMINOPHEN 325 MG PO TABS
650.0000 mg | ORAL_TABLET | Freq: Four times a day (QID) | ORAL | Status: DC | PRN
  Administered 2022-06-11 – 2022-06-13 (×5): 650 mg via ORAL
  Filled 2022-06-10 (×5): qty 2

## 2022-06-10 MED ORDER — QUETIAPINE FUMARATE 100 MG PO TABS
100.0000 mg | ORAL_TABLET | Freq: Every day | ORAL | Status: DC
  Administered 2022-06-10: 100 mg via ORAL
  Filled 2022-06-10: qty 1

## 2022-06-10 MED ORDER — THIAMINE HCL 100 MG PO TABS
100.0000 mg | ORAL_TABLET | Freq: Every day | ORAL | Status: DC
  Administered 2022-06-11 – 2022-06-15 (×5): 100 mg via ORAL
  Filled 2022-06-10 (×5): qty 1
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
## 2022-06-10 NOTE — ED Notes (Signed)
Pt was given dinner. 

## 2022-06-10 NOTE — ED Notes (Signed)
Pt sleeping at present, no distress noted.  Monitoring for safety. 

## 2022-06-10 NOTE — BH Assessment (Signed)
Comprehensive Clinical Assessment (CCA) Screening, Triage and Referral Note  06/10/2022 [name] [id number]  Disposition: Per Liborio Nixon, NP, patient is recommended for overnight observation.  Flowsheet Row ED from 06/10/2022 in [facility] ED from 05/29/2022 in [facility] ED from 05/24/2022 in [facility]  C-SSRS RISK CATEGORY Moderate Risk No Risk High Risk      The patient demonstrates the following risk factors for suicide: Chronic risk factors for suicide include: psychiatric disorder of depression, substance use disorder, and medical illness HBP . Acute risk factors for suicide include: N/A. Protective factors for this patient include:  none . Considering these factors, the overall suicide risk at this point appears to be moderate. Patient is not appropriate for outpatient follow up.  Chief Complaint:  Chief Complaint  Patient presents with   Addiction Problem    [name] 66 year old male presents to [facility] voluntary brought by [employer]. Patient stated, "I was just discharged 2 1/2 weeks ago but I need help. I need a long term facility." Per chart review patient was discharged from [facility] 06/01/2022. He reports continues use of drinking alcohol, an abusing cocaine / crack cocaine. Report suicidal ideations no plan, homicidal ideations if someone missing with me and auditory/visual hallucinations.    Visit Diagnosis:  Alcohol use disorder  Uncomplicated alcohol dependence (HCC)  Suicidal ideation    Patient Reported Information How did you hear about [place]? Self  What Is the Reason for Your Visit/Call Today? [name] 66 year old male presents to [facility] voluntary brought by [employer]. Patient stated, "I was just discharged 2 1/2 weeks ago but I need help. I need a long term facility." Per chart review patient was discharged from [facility] 06/01/2022. He reports continues use of drinking alcohol, an abusing cocaine  / crack cocaine. Report drank four 40oz beers since 5am this morning. Last abuse crack cocaine Wednesday $50 worth. Report suicidal ideations no plan, homicidal ideations if someone missing with me and auditory/visual hallucinations. Report, "I think everyday about taking my life. I wish I wasn't alive today." Report auditory hallucinatios triggered by a busted ear drum in his right ear.  Patient reports he has an ACT provider but does not know the name. Patient reports frustration with ACT team taking to long to help him with getting to his appointments. Patient was living at [facility] and was encouraged to seek help for his alcohol and drug use.   Patient is oriented x4, engaged, alert, agitated and irritable. Patient argumentative but eventually settles down during the assessment.      How Long Has This Been Causing You Problems? 1-6 months  What Do You Feel Would Help You the Most Today? Alcohol or Drug Use Treatment   Have You Recently Had Any Thoughts About Hurting Yourself? Yes  Are You Planning to Commit Suicide/Harm Yourself At This time? No   Have you Recently Had Thoughts About Hurting Someone [name]? No  Are You Planning to Harm Someone at This Time? No  Explanation: No data recorded  Have You Used Any Alcohol or Drugs in the Past 24 Hours? Yes  How Long Ago Did You Use Drugs or Alcohol? No data recorded What Did You Use and How Much? 4  40oz beers   Do You Currently Have a Therapist/Psychiatrist? No  Name of Therapist/Psychiatrist: No data recorded  Have You Been Recently Discharged From Any Office Practice or Programs? Yes  Explanation of Discharge From Practice/Program: Discharged from [facility]  on 06/01/22    CCA Screening Triage Referral Assessment Type of Contact: Face-to-Face  Telemedicine Service Delivery: Telemedicine service delivery: This service was provided via telemedicine using a 2-way, interactive audio and video technology  Is this Initial or  Reassessment? Reassessment  Date Telepsych consult ordered in [facility]:  05/24/22  Time Telepsych consult ordered in [facility]:  2115  Location of Assessment: [facility] Assessment Services  Provider Location: [facility] Assessment Services   Collateral Involvement: none   Does Patient Have a Automotive engineer Guardian? No data recorded Name and Contact of Legal Guardian: No data recorded If Minor and Not Living with Parent(s), Who has Custody? NA  Is CPS involved or ever been involved? Never  Is APS involved or ever been involved? Never   Patient Determined To Be At Risk for Harm To Self or Others Based on Review of Patient Reported Information or Presenting Complaint? Yes, for Self-Harm  Method: No data recorded Availability of Means: No data recorded Intent: No data recorded Notification Required: No data recorded Additional Information for Danger to Others Potential: No data recorded Additional Comments for Danger to Others Potential: No data recorded Are There Guns or Other Weapons in Your Home? No data recorded Types of Guns/Weapons: No data recorded Are These Weapons Safely Secured?                            No data recorded Who Could Verify You Are Able To Have These Secured: No data recorded Do You Have any Outstanding Charges, Pending Court Dates, Parole/Probation? No data recorded Contacted To Inform of Risk of Harm To Self or Others: Unable to Contact:   Does Patient Present under Involuntary Commitment? No  IVC Papers Initial File Date: 05/05/22   [place] of Residence: [place]   Patient Currently Receiving the Following Services: Not Receiving Services   Determination of Need: Emergent (2 hours)   Options For Referral: Inpatient Hospitalization; Facility-Based Crisis; [facility] Urgent Care; Outpatient Therapy; Medication Management; Chemical Dependency Intensive Outpatient Therapy (CDIOP)   Discharge Disposition:     Audree Camel, [facility]

## 2022-06-10 NOTE — ED Provider Notes (Signed)
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 Final   Creatinine, Ser 05/24/2022 1.04  0.61 - 1.24 mg/dL Final   Calcium 30/86/5784 9.0  8.9 - 10.3 mg/dL Final   Total Protein 69/62/9528 7.4  6.5 - 8.1 g/dL Final   Albumin 41/32/4401 3.6  3.5 - 5.0 g/dL Final   AST 02/72/5366 36  15 - 41 U/L Final   ALT  05/24/2022 26  0 - 44 U/L Final   Alkaline Phosphatase 05/24/2022 77  38 - 126 U/L Final   Total Bilirubin 05/24/2022 1.2  0.3 - 1.2 mg/dL Final   GFR, Estimated 05/24/2022 >60  >60 mL/min Final   Comment: (NOTE) Calculated using the CKD-EPI Creatinine Equation (2021)    Anion gap 05/24/2022 10  5 - 15 Final   Performed at [facility], [address], [place], [place] [address]   Hgb A1c MFr Bld 05/24/2022 5.2  4.8 - 5.6 % Final   Comment: (NOTE) Pre diabetes:          5.7%-6.4%  Diabetes:              >6.4%  Glycemic control for   <7.0% adults with diabetes  Mean Plasma Glucose 05/24/2022 102.54  mg/dL Final   Performed at [facility], [address] [place], [place] [id number]   Alcohol, Ethyl (B) 05/24/2022 99 (H)  <10 mg/dL Final   Comment: (NOTE) Lowest detectable limit for serum alcohol is 10 mg/dL.  For medical purposes only. Performed at [facility], [address] [place], [place] [address]    Hepatitis B Surface Ag 05/24/2022 NON REACTIVE  NON REACTIVE Final   HCV Ab 05/24/2022 Reactive (A)  NON REACTIVE Final   Comment: (NOTE) The CDC recommends that a Reactive HCV antibody result be followed up  with a HCV Nucleic Acid Amplification test.     Hep A IgM 05/24/2022 NON REACTIVE  NON REACTIVE Final   Hep B C IgM 05/24/2022 NON REACTIVE  NON REACTIVE Final   Performed at [facility], [address] [place], [place] [address]   POC Amphetamine UR 05/25/2022 None Detected  NONE DETECTED (Cut Off Level 1000 ng/mL) Final   POC Secobarbital (BAR) 05/25/2022 None Detected  NONE DETECTED (Cut Off Level 300 ng/mL) Final   POC Buprenorphine (BUP) 05/25/2022 None Detected  NONE DETECTED (Cut Off Level 10 ng/mL) Final   POC Oxazepam (BZO) 05/25/2022 Positive (A)  NONE DETECTED (Cut Off Level 300 ng/mL) Final   POC Cocaine UR 05/25/2022 Positive (A)  NONE DETECTED (Cut Off Level 300 ng/mL) Final   POC Methamphetamine UR 05/25/2022 None Detected   NONE DETECTED (Cut Off Level 1000 ng/mL) Final   POC Morphine 05/25/2022 None Detected  NONE DETECTED (Cut Off Level 300 ng/mL) Final   POC Methadone UR 05/25/2022 None Detected  NONE DETECTED (Cut Off Level 300 ng/mL) Final   POC Oxycodone UR 05/25/2022 None Detected  NONE DETECTED (Cut Off Level 100 ng/mL) Final   POC Marijuana UR 05/25/2022 None Detected  NONE DETECTED (Cut Off Level 50 ng/mL) Final   SARSCOV2ONAVIRUS 2 AG 05/24/2022 NEGATIVE  NEGATIVE Final   Comment: (NOTE) SARS-CoV-2 antigen NOT DETECTED.   Negative results are presumptive.  Negative results do not preclude SARS-CoV-2 infection and should not be used as the sole basis for treatment or other patient management decisions, including infection  control decisions, particularly in the presence of clinical signs and  symptoms consistent with COVID-19, or in those who have been in contact with the virus.  Negative results must be combined with clinical observations, patient history, and epidemiological information. The expected result is Negative.  Fact Sheet for Patients: https://www.jennings-kim.com/https://www.fda.gov/media/141569/download  Fact Sheet for Healthcare Providers: https://alexander-rogers.biz/https://www.fda.gov/media/141568/download  This test is not yet approved or cleared by the [place] FDA and  has been authorized for detection and/or diagnosis of SARS-CoV-2 by FDA under an Emergency Use Authorization (EUA).  This EUA will remain in effect (meaning this test can be used) for the duration of  the COV                          ID-19 declaration under Section 564(b)(1) of the Act, 21 U.S.C. section 360bbb-3(b)(1), unless the authorization is terminated or revoked sooner.     Cholesterol 05/24/2022 141  0 - 200 mg/dL Final   Triglycerides 21/30/865708/05/2022 94  <150 mg/dL Final   HDL 84/69/629508/05/2022 57  >40 mg/dL Final   Total CHOL/HDL Ratio 05/24/2022 2.5  RATIO Final   VLDL 05/24/2022 19  0 - 40 mg/dL Final   LDL Cholesterol 05/24/2022 NOT CALCULATED  0 - 99  mg/dL Final  Performed at [facility], [address] [place], [place] [address]   TSH 05/24/2022 2.834  0.350 - 4.500 uIU/mL Final   Comment: Performed by a 3rd Generation assay with a functional sensitivity of <=0.01 uIU/mL. Performed at [facility], [address], [place], [place] [address]   Admission on 05/10/2022, Discharged on 05/11/2022  Component Date Value Ref Range Status   SARS Coronavirus 2 by RT PCR 05/10/2022 NEGATIVE  NEGATIVE Final   Comment: (NOTE) SARS-CoV-2 target nucleic acids are NOT DETECTED.  The SARS-CoV-2 RNA is generally detectable in upper respiratory specimens during the acute phase of infection. The lowest concentration of SARS-CoV-2 viral copies this assay can detect is 138 copies/mL. A negative result does not preclude SARS-Cov-2 infection and should not be used as the sole basis for treatment or other patient management decisions. A negative result may occur with  improper specimen collection/handling, submission of specimen other than nasopharyngeal swab, presence of viral mutation(s) within the areas targeted by this assay, and inadequate number of viral copies(<138 copies/mL). A negative result must be combined with clinical observations, patient history, and epidemiological information. The expected result is Negative.  Fact Sheet for Patients:  BloggerCourse.com  Fact Sheet for Healthcare Providers:  SeriousBroker.it  This test is no                          t yet approved or cleared by the [place] FDA and  has been authorized for detection and/or diagnosis of SARS-CoV-2 by FDA under an Emergency Use Authorization (EUA). This EUA will remain  in effect (meaning this test can be used) for the duration of the COVID-19 declaration under Section 564(b)(1) of the Act, 21 U.S.C.section 360bbb-3(b)(1), unless the authorization is terminated  or revoked sooner.        Influenza A by PCR 05/10/2022 NEGATIVE  NEGATIVE Final   Influenza B by PCR 05/10/2022 NEGATIVE  NEGATIVE Final   Comment: (NOTE) The Xpert Xpress SARS-CoV-2/FLU/RSV plus assay is intended as an aid in the diagnosis of influenza from Nasopharyngeal swab specimens and should not be used as a sole basis for treatment. Nasal washings and aspirates are unacceptable for Xpert Xpress SARS-CoV-2/FLU/RSV testing.  Fact Sheet for Patients: BloggerCourse.com  Fact Sheet for Healthcare Providers: SeriousBroker.it  This test is not yet approved or cleared by the [place] FDA and has been authorized for detection and/or diagnosis of SARS-CoV-2 by FDA under an Emergency Use Authorization (EUA). This EUA will remain in effect (meaning this test can be used) for the duration of the COVID-19 declaration under Section 564(b)(1) of the Act, 21 U.S.C. section 360bbb-3(b)(1), unless the authorization is terminated or revoked.  Performed at [facility], [address] [place], [place] [address]    Sodium 05/10/2022 137  135 - 145 mmol/L Final   Potassium 05/10/2022 3.7  3.5 - 5.1 mmol/L Final   Chloride 05/10/2022 106  98 - 111 mmol/L Final   CO2 05/10/2022 23  22 - 32 mmol/L Final   Glucose, Bld 05/10/2022 90  70 - 99 mg/dL Final   Glucose reference range applies only to samples taken after fasting for at least 8 hours.   BUN 05/10/2022 13  8 - 23 mg/dL Final   Creatinine, Ser 05/10/2022 1.04  0.61 - 1.24 mg/dL Final   Calcium 91/47/8295 8.8 (L)  8.9 - 10.3 mg/dL Final   Total Protein 62/13/0865 7.8  6.5 - 8.1  g/dL Final   Albumin 16/07/9603 3.6  3.5 - 5.0 g/dL Final   AST 54/06/8118 30  15 - 41 U/L Final   ALT 05/10/2022 21  0 - 44 U/L Final   Alkaline Phosphatase 05/10/2022 62  38 - 126 U/L Final   Total Bilirubin 05/10/2022 0.8  0.3 - 1.2 mg/dL Final   GFR, Estimated 05/10/2022 >60  >60 mL/min Final   Comment:  (NOTE) Calculated using the CKD-EPI Creatinine Equation (2021)    Anion gap 05/10/2022 8  5 - 15 Final   Performed at [facility], [address] [place], [place] [address]   Alcohol, Ethyl (B) 05/10/2022 156 (H)  <10 mg/dL Final   Comment: (NOTE) Lowest detectable limit for serum alcohol is 10 mg/dL.  For medical purposes only. Performed at [facility], [address] [place], [place] [address]    Opiates 05/10/2022 POSITIVE (A)  NONE DETECTED Final   Cocaine 05/10/2022 POSITIVE (A)  NONE DETECTED Final   Benzodiazepines 05/10/2022 POSITIVE (A)  NONE DETECTED Final   Amphetamines 05/10/2022 NONE DETECTED  NONE DETECTED Final   Tetrahydrocannabinol 05/10/2022 NONE DETECTED  NONE DETECTED Final   Barbiturates 05/10/2022 NONE DETECTED  NONE DETECTED Final   Comment: (NOTE) DRUG SCREEN FOR MEDICAL PURPOSES ONLY.  IF CONFIRMATION IS NEEDED FOR ANY PURPOSE, NOTIFY LAB WITHIN 5 DAYS.  LOWEST DETECTABLE LIMITS FOR URINE DRUG SCREEN Drug Class                     Cutoff (ng/mL) Amphetamine and metabolites    1000 Barbiturate and metabolites    200 Benzodiazepine                 200 Tricyclics and metabolites     300 Opiates and metabolites        300 Cocaine and metabolites        300 THC                            50 Performed at [facility], [address], [place], [place] [address]    WBC 05/10/2022 4.5  4.0 - 10.5 K/uL Final   RBC 05/10/2022 3.77 (L)  4.22 - 5.81 MIL/uL Final   Hemoglobin 05/10/2022 8.3 (L)  13.0 - 17.0 g/dL Final   Comment: Reticulocyte Hemoglobin testing may be clinically indicated, consider ordering this additional test [id number]    HCT 05/10/2022 27.3 (L)  39.0 - 52.0 % Final   MCV 05/10/2022 72.4 (L)  80.0 - 100.0 fL Final   MCH 05/10/2022 22.0 (L)  26.0 - 34.0 pg Final   MCHC 05/10/2022 30.4  30.0 - 36.0 g/dL Final   RDW 52/84/1324 19.8 (H)  11.5 - 15.5 % Final   Platelets 05/10/2022  162  150 - 400 K/uL Final   nRBC 05/10/2022 0.0  0.0 - 0.2 % Final   Neutrophils Relative % 05/10/2022 55  % Final   Neutro Abs 05/10/2022 2.5  1.7 - 7.7 K/uL Final   Lymphocytes Relative 05/10/2022 32  % Final   Lymphs Abs 05/10/2022 1.4  0.7 - 4.0 K/uL Final   Monocytes Relative 05/10/2022 10  % Final   Monocytes Absolute 05/10/2022 0.4  0.1 - 1.0 K/uL Final   Eosinophils Relative 05/10/2022 2  % Final   Eosinophils Absolute 05/10/2022 0.1  0.0 - 0.5 K/uL Final   Basophils Relative 05/10/2022 1  % Final  Basophils Absolute 05/10/2022 0.1  0.0 - 0.1 K/uL Final   Immature Granulocytes 05/10/2022 0  % Final   Abs Immature Granulocytes 05/10/2022 0.01  0.00 - 0.07 K/uL Final   Performed at [facility], [address], [place], [place] [address]   Salicylate Lvl 05/10/2022 <7.0 (L)  7.0 - 30.0 mg/dL Final   Performed at [facility], [address], [place], [place] [address]   Acetaminophen (Tylenol), Serum 05/10/2022 <10 (L)  10 - 30 ug/mL Final   Comment: (NOTE) Therapeutic concentrations vary significantly. A range of 10-30 ug/mL  may be an effective concentration for many patients. However, some  are best treated at concentrations outside of this range. Acetaminophen concentrations >150 ug/mL at 4 hours after ingestion  and >50 ug/mL at 12 hours after ingestion are often associated with  toxic reactions.  Performed at [facility], [address] [place], [place] [address]    Magnesium 05/10/2022 2.0  1.7 - 2.4 mg/dL Final   Performed at [facility], [address], [place], [place] [address]   Color, Urine 05/10/2022 STRAW (A)  YELLOW Final   APPearance 05/10/2022 CLEAR  CLEAR Final   Specific Gravity, Urine 05/10/2022 1.005  1.005 - 1.030 Final   pH 05/10/2022 5.0  5.0 - 8.0 Final   Glucose, UA 05/10/2022 NEGATIVE  NEGATIVE mg/dL Final   Hgb urine dipstick 05/10/2022 NEGATIVE  NEGATIVE Final    Bilirubin Urine 05/10/2022 NEGATIVE  NEGATIVE Final   Ketones, ur 05/10/2022 NEGATIVE  NEGATIVE mg/dL Final   Protein, ur 08/65/7846 NEGATIVE  NEGATIVE mg/dL Final   Nitrite 96/29/5284 NEGATIVE  NEGATIVE Final   Leukocytes,Ua 05/10/2022 NEGATIVE  NEGATIVE Final   Performed at [facility], [address], [place], [place] [address]  Admission on 05/05/2022, Discharged on 05/06/2022  Component Date Value Ref Range Status   WBC 05/05/2022 7.3  4.0 - 10.5 K/uL Final   RBC 05/05/2022 3.68 (L)  4.22 - 5.81 MIL/uL Final   Hemoglobin 05/05/2022 8.2 (L)  13.0 - 17.0 g/dL Final   Comment: Reticulocyte Hemoglobin testing may be clinically indicated, consider ordering this additional test [id number]    HCT 05/05/2022 27.1 (L)  39.0 - 52.0 % Final   MCV 05/05/2022 73.6 (L)  80.0 - 100.0 fL Final   MCH 05/05/2022 22.3 (L)  26.0 - 34.0 pg Final   MCHC 05/05/2022 30.3  30.0 - 36.0 g/dL Final   RDW 66/44/0347 19.6 (H)  11.5 - 15.5 % Final   Platelets 05/05/2022 234  150 - 400 K/uL Final   nRBC 05/05/2022 0.0  0.0 - 0.2 % Final   Neutrophils Relative % 05/05/2022 44  % Final   Neutro Abs 05/05/2022 3.2  1.7 - 7.7 K/uL Final   Lymphocytes Relative 05/05/2022 38  % Final   Lymphs Abs 05/05/2022 2.8  0.7 - 4.0 K/uL Final   Monocytes Relative 05/05/2022 13  % Final   Monocytes Absolute 05/05/2022 0.9  0.1 - 1.0 K/uL Final   Eosinophils Relative 05/05/2022 4  % Final   Eosinophils Absolute 05/05/2022 0.3  0.0 - 0.5 K/uL Final   Basophils Relative 05/05/2022 1  % Final   Basophils Absolute 05/05/2022 0.1  0.0 - 0.1 K/uL Final   Immature Granulocytes 05/05/2022 0  % Final   Abs Immature Granulocytes 05/05/2022 0.02  0.00 - 0.07 K/uL Final   Performed at [facility], [address] [place], [place] [address]  Sodium 05/05/2022 138  135 - 145 mmol/L Final   Potassium 05/05/2022 3.8  3.5 - 5.1 mmol/L Final   Chloride 05/05/2022 109  98 - 111 mmol/L Final   CO2 05/05/2022 17 (L)   22 - 32 mmol/L Final   Glucose, Bld 05/05/2022 91  70 - 99 mg/dL Final   Glucose reference range applies only to samples taken after fasting for at least 8 hours.   BUN 05/05/2022 9  8 - 23 mg/dL Final   Creatinine, Ser 05/05/2022 0.87  0.61 - 1.24 mg/dL Final   Calcium 40/98/1191 8.6 (L)  8.9 - 10.3 mg/dL Final   Total Protein 47/82/9562 7.9  6.5 - 8.1 g/dL Final   Albumin 13/05/6577 3.3 (L)  3.5 - 5.0 g/dL Final   AST 46/96/2952 33  15 - 41 U/L Final   ALT 05/05/2022 23  0 - 44 U/L Final   Alkaline Phosphatase 05/05/2022 60  38 - 126 U/L Final   Total Bilirubin 05/05/2022 0.9  0.3 - 1.2 mg/dL Final   GFR, Estimated 05/05/2022 >60  >60 mL/min Final   Comment: (NOTE) Calculated using the CKD-EPI Creatinine Equation (2021)    Anion gap 05/05/2022 12  5 - 15 Final   Performed at [facility], [address] [place], [place] [address]   Alcohol, Ethyl (B) 05/05/2022 169 (H)  <10 mg/dL Final   Comment: (NOTE) Lowest detectable limit for serum alcohol is 10 mg/dL.  For medical purposes only. Performed at [facility], [address], [place], [place] [address]    Salicylate Lvl 05/05/2022 <7.0 (L)  7.0 - 30.0 mg/dL Final   Performed at [facility], [address] [place], [place] [address]   Acetaminophen (Tylenol), Serum 05/05/2022 <10 (L)  10 - 30 ug/mL Final   Comment: (NOTE) Therapeutic concentrations vary significantly. A range of 10-30 ug/mL  may be an effective concentration for many patients. However, some  are best treated at concentrations outside of this range. Acetaminophen concentrations >150 ug/mL at 4 hours after ingestion  and >50 ug/mL at 12 hours after ingestion are often associated with  toxic reactions.  Performed at [facility], [address] [place], [place] [address]    Opiates 05/06/2022 NONE DETECTED  NONE DETECTED Final   Cocaine 05/06/2022 NONE DETECTED  NONE DETECTED Final   Benzodiazepines 05/06/2022 POSITIVE (A)  NONE  DETECTED Final   Amphetamines 05/06/2022 NONE DETECTED  NONE DETECTED Final   Tetrahydrocannabinol 05/06/2022 NONE DETECTED  NONE DETECTED Final   Barbiturates 05/06/2022 NONE DETECTED  NONE DETECTED Final   Comment: (NOTE) DRUG SCREEN FOR MEDICAL PURPOSES ONLY.  IF CONFIRMATION IS NEEDED FOR ANY PURPOSE, NOTIFY LAB WITHIN 5 DAYS.  LOWEST DETECTABLE LIMITS FOR URINE DRUG SCREEN Drug Class                     Cutoff (ng/mL) Amphetamine and metabolites    1000 Barbiturate and metabolites    200 Benzodiazepine                 200 Tricyclics and metabolites     300 Opiates and metabolites        300 Cocaine and metabolites        300 THC                            50 Performed at [facility], [address] [place], [place] [address]    SARS Coronavirus 2 by RT PCR 05/06/2022 NEGATIVE  NEGATIVE Final   Comment: (NOTE) SARS-CoV-2 target nucleic acids are NOT DETECTED.  The SARS-CoV-2 RNA is generally detectable in upper respiratory specimens during the acute phase of infection. The lowest concentration of SARS-CoV-2 viral copies this assay can detect is 138 copies/mL. A negative result does not preclude SARS-Cov-2 infection and should not be used as the sole basis for treatment or other patient management decisions. A negative result may occur with  improper specimen collection/handling, submission of specimen other than nasopharyngeal swab, presence of viral mutation(s) within the areas targeted by this assay, and inadequate number of viral copies(<138 copies/mL). A negative result must be combined with clinical observations, patient history, and epidemiological information. The expected result is Negative.  Fact Sheet for Patients:  BloggerCourse.com  Fact Sheet for Healthcare Providers:  SeriousBroker.it  This test is no                          t yet approved or cleared by the [place] FDA and  has been  authorized for detection and/or diagnosis of SARS-CoV-2 by FDA under an Emergency Use Authorization (EUA). This EUA will remain  in effect (meaning this test can be used) for the duration of the COVID-19 declaration under Section 564(b)(1) of the Act, 21 U.S.C.section 360bbb-3(b)(1), unless the authorization is terminated  or revoked sooner.       Influenza A by PCR 05/06/2022 NEGATIVE  NEGATIVE Final   Influenza B by PCR 05/06/2022 NEGATIVE  NEGATIVE Final   Comment: (NOTE) The Xpert Xpress SARS-CoV-2/FLU/RSV plus assay is intended as an aid in the diagnosis of influenza from Nasopharyngeal swab specimens and should not be used as a sole basis for treatment. Nasal washings and aspirates are unacceptable for Xpert Xpress SARS-CoV-2/FLU/RSV testing.  Fact Sheet for Patients: BloggerCourse.com  Fact Sheet for Healthcare Providers: SeriousBroker.it  This test is not yet approved or cleared by the [place] FDA and has been authorized for detection and/or diagnosis of SARS-CoV-2 by FDA under an Emergency Use Authorization (EUA). This EUA will remain in effect (meaning this test can be used) for the duration of the COVID-19 declaration under Section 564(b)(1) of the Act, 21 U.S.C. section 360bbb-3(b)(1), unless the authorization is terminated or revoked.  Performed at [facility], [address] [place], [place] [address]   Admission on 05/05/2022, Discharged on 05/05/2022  Component Date Value Ref Range Status   Sodium 05/05/2022 138  135 - 145 mmol/L Final   Potassium 05/05/2022 3.7  3.5 - 5.1 mmol/L Final   Chloride 05/05/2022 105  98 - 111 mmol/L Final   CO2 05/05/2022 19 (L)  22 - 32 mmol/L Final   Glucose, Bld 05/05/2022 91  70 - 99 mg/dL Final   Glucose reference range applies only to samples taken after fasting for at least 8 hours.   BUN 05/05/2022 11  8 - 23 mg/dL Final   Creatinine, Ser 05/05/2022 0.90   0.61 - 1.24 mg/dL Final   Calcium 09/81/1914 8.9  8.9 - 10.3 mg/dL Final   GFR, Estimated 05/05/2022 >60  >60 mL/min Final   Comment: (NOTE) Calculated using the CKD-EPI Creatinine Equation (2021)    Anion gap 05/05/2022 14  5 - 15 Final   Performed at [facility], [address], [place], [place] [address]   WBC 05/05/2022 8.0  4.0 -  10.5 K/uL Final   RBC 05/05/2022 3.78 (L)  4.22 - 5.81 MIL/uL Final   Hemoglobin 05/05/2022 8.4 (L)  13.0 - 17.0 g/dL Final   Comment: Reticulocyte Hemoglobin testing may be clinically indicated, consider ordering this additional test [id number]    HCT 05/05/2022 27.4 (L)  39.0 - 52.0 % Final   MCV 05/05/2022 72.5 (L)  80.0 - 100.0 fL Final   MCH 05/05/2022 22.2 (L)  26.0 - 34.0 pg Final   MCHC 05/05/2022 30.7  30.0 - 36.0 g/dL Final   RDW 47/82/9562 19.8 (H)  11.5 - 15.5 % Final   Platelets 05/05/2022 222  150 - 400 K/uL Final   nRBC 05/05/2022 0.0  0.0 - 0.2 % Final   Performed at [facility], [address], [place], [place] [address]   Troponin I (High Sensitivity) 05/05/2022 10  <18 ng/L Final   Comment: (NOTE) Elevated high sensitivity troponin I (hsTnI) values and significant  changes across serial measurements may suggest ACS but many other  chronic and acute conditions are known to elevate hsTnI results.  Refer to the "Links" section for chest pain algorithms and additional  guidance. Performed at [facility], [address], [place], [place] [address]    Troponin I (High Sensitivity) 05/05/2022 7  <18 ng/L Final   Comment: (NOTE) Elevated high sensitivity troponin I (hsTnI) values and significant  changes across serial measurements may suggest ACS but many other  chronic and acute conditions are known to elevate hsTnI results.  Refer to the "Links" section for chest pain algorithms and additional  guidance. Performed at [facility], [address], [place], [place] [address]   Admission on 04/20/2022,  Discharged on 05/03/2022  Component Date Value Ref Range Status   Sodium 04/20/2022 135  135 - 145 mmol/L Final   Potassium 04/20/2022 4.1  3.5 - 5.1 mmol/L Final   Chloride 04/20/2022 100  98 - 111 mmol/L Final   CO2 04/20/2022 19 (L)  22 - 32 mmol/L Final   Glucose, Bld 04/20/2022 90  70 - 99 mg/dL Final   Glucose reference range applies only to samples taken after fasting for at least 8 hours.   BUN 04/20/2022 10  8 - 23 mg/dL Final   Creatinine, Ser 04/20/2022 1.06  0.61 - 1.24 mg/dL Final   Calcium 28/41/3244 8.8 (L)  8.9 - 10.3 mg/dL Final   Total Protein 10/19/7251 7.3  6.5 - 8.1 g/dL Final   Albumin 66/44/0347 3.4 (L)  3.5 - 5.0 g/dL Final   AST 42/59/5638 74 (H)  15 - 41 U/L Final   ALT 04/20/2022 41  0 - 44 U/L Final   Alkaline Phosphatase 04/20/2022 69  38 - 126 U/L Final   Total Bilirubin 04/20/2022 1.4 (H)  0.3 - 1.2 mg/dL Final   GFR, Estimated 04/20/2022 >60  >60 mL/min Final   Comment: (NOTE) Calculated using the CKD-EPI Creatinine Equation (2021)    Anion gap 04/20/2022 16 (H)  5 - 15 Final   Performed at [facility], [address] [place], [place] [address]   Sodium 04/20/2022 135  135 - 145 mmol/L Final   Potassium 04/20/2022 4.2  3.5 - 5.1 mmol/L Final   Chloride 04/20/2022 103  98 - 111 mmol/L Final   BUN 04/20/2022 10  8 - 23 mg/dL Final   Creatinine, Ser 04/20/2022 1.30 (H)  0.61 - 1.24 mg/dL Final   Glucose, Bld 32/95/1884 93  70 -  99 mg/dL Final   Glucose reference range applies only to samples taken after fasting for at least 8 hours.   Calcium, Ion 04/20/2022 0.88 (LL)  1.15 - 1.40 mmol/L Final   TCO2 04/20/2022 21 (L)  22 - 32 mmol/L Final   Hemoglobin 04/20/2022 12.9 (L)  13.0 - 17.0 g/dL Final   HCT 16/07/9603 38.0 (L)  39.0 - 52.0 % Final   Comment 04/20/2022 NOTIFIED PHYSICIAN   Final   WBC 04/20/2022 6.9  4.0 - 10.5 K/uL Final   RBC 04/20/2022 4.91  4.22 - 5.81 MIL/uL Final   Hemoglobin 04/20/2022 11.1 (L)  13.0 - 17.0 g/dL Final   HCT  54/06/8118 38.0 (L)  39.0 - 52.0 % Final   MCV 04/20/2022 77.4 (L)  80.0 - 100.0 fL Final   MCH 04/20/2022 22.6 (L)  26.0 - 34.0 pg Final   MCHC 04/20/2022 29.2 (L)  30.0 - 36.0 g/dL Final   RDW 14/78/2956 21.0 (H)  11.5 - 15.5 % Final   Platelets 04/20/2022 139 (L)  150 - 400 K/uL Final   nRBC 04/20/2022 0.0  0.0 - 0.2 % Final   Performed at [facility], [address], [place], [place] [address]   Alcohol, Ethyl (B) 04/20/2022 262 (H)  <10 mg/dL Final   Comment: (NOTE) Lowest detectable limit for serum alcohol is 10 mg/dL.  For medical purposes only. Performed at [facility], [address] [place], [place] [address]    Color, Urine 04/20/2022 YELLOW  YELLOW Final   APPearance 04/20/2022 CLEAR  CLEAR Final   Specific Gravity, Urine 04/20/2022 1.008  1.005 - 1.030 Final   pH 04/20/2022 7.0  5.0 - 8.0 Final   Glucose, UA 04/20/2022 NEGATIVE  NEGATIVE mg/dL Final   Hgb urine dipstick 04/20/2022 NEGATIVE  NEGATIVE Final   Bilirubin Urine 04/20/2022 NEGATIVE  NEGATIVE Final   Ketones, ur 04/20/2022 NEGATIVE  NEGATIVE mg/dL Final   Protein, ur 69/62/9528 NEGATIVE  NEGATIVE mg/dL Final   Nitrite 41/32/4401 NEGATIVE  NEGATIVE Final   Leukocytes,Ua 04/20/2022 NEGATIVE  NEGATIVE Final   Performed at [facility], [address], [place], [place] [address]   Lactic Acid, Venous 04/20/2022 3.2 (HH)  0.5 - 1.9 mmol/L Final   Comment: CRITICAL RESULT CALLED TO, READ BACK BY AND VERIFIED WITH: M.RUGGIERO,RN  04/20/2022 [email or web address] Performed at [facility], [address], [place], [place] [address]    Prothrombin Time 04/20/2022 14.1  11.4 - 15.2 seconds Final   INR 04/20/2022 1.1  0.8 - 1.2 Final   Comment: (NOTE) INR goal varies based on device and disease states. Performed at [facility], [address] [place], [place] [address]    Blood Bank Specimen 04/20/2022 SAMPLE AVAILABLE FOR TESTING   Final   Sample Expiration 04/20/2022    Final                    Value:04/21/2022,2359 Performed at [facility], [address], [place], [place] [address]    Sodium 04/21/2022 141  135 - 145 mmol/L Final   Potassium 04/21/2022 4.1  3.5 - 5.1 mmol/L Final   Chloride 04/21/2022 102  98 - 111 mmol/L Final   CO2 04/21/2022 25  22 - 32 mmol/L Final   Glucose, Bld 04/21/2022 104 (H)  70 - 99 mg/dL Final   Glucose reference range applies only to samples taken after fasting for at least 8 hours.  BUN 04/21/2022 12  8 - 23 mg/dL Final   Creatinine, Ser 04/21/2022 0.97  0.61 - 1.24 mg/dL Final   Calcium 81/19/1478 9.1  8.9 - 10.3 mg/dL Final   GFR, Estimated 04/21/2022 >60  >60 mL/min Final   Comment: (NOTE) Calculated using the CKD-EPI Creatinine Equation (2021)    Anion gap 04/21/2022 14  5 - 15 Final   Performed at [facility], [address], [place], [place] [address]   WBC 04/21/2022 5.8  4.0 - 10.5 K/uL Final   RBC 04/21/2022 3.94 (L)  4.22 - 5.81 MIL/uL Final   Hemoglobin 04/21/2022 9.1 (L)  13.0 - 17.0 g/dL Final   HCT 13/05/6577 29.3 (L)  39.0 - 52.0 % Final   MCV 04/21/2022 74.4 (L)  80.0 - 100.0 fL Final   MCH 04/21/2022 23.1 (L)  26.0 - 34.0 pg Final   MCHC 04/21/2022 31.1  30.0 - 36.0 g/dL Final   RDW 46/96/2952 20.3 (H)  11.5 - 15.5 % Final   Platelets 04/21/2022 99 (L)  150 - 400 K/uL Final   Comment: Immature Platelet Fraction may be clinically indicated, consider ordering this additional test [id number] REPEATED TO VERIFY PLATELET COUNT CONFIRMED BY SMEAR    nRBC 04/21/2022 0.0  0.0 - 0.2 % Final   Performed at [facility], [address] [place], [place] [address]   HIV Screen 4th Generation wRfx 04/21/2022 Non Reactive  Non Reactive Final   Performed at [facility], [address], [place], [place] [address]   Sodium 04/22/2022 138  135 - 145 mmol/L Final   Potassium 04/22/2022 3.7  3.5 - 5.1 mmol/L Final   Chloride 04/22/2022 103  98 - 111 mmol/L Final   CO2 04/22/2022 26  22 - 32 mmol/L Final    Glucose, Bld 04/22/2022 124 (H)  70 - 99 mg/dL Final   Glucose reference range applies only to samples taken after fasting for at least 8 hours.   BUN 04/22/2022 9  8 - 23 mg/dL Final   Creatinine, Ser 04/22/2022 0.90  0.61 - 1.24 mg/dL Final   Calcium 40/34/7425 8.6 (L)  8.9 - 10.3 mg/dL Final   GFR, Estimated 04/22/2022 >60  >60 mL/min Final   Comment: (NOTE) Calculated using the CKD-EPI Creatinine Equation (2021)    Anion gap 04/22/2022 9  5 - 15 Final   Performed at [facility], [address] [place], [place] [address]   WBC 04/22/2022 3.6 (L)  4.0 - 10.5 K/uL Final   RBC 04/22/2022 3.55 (L)  4.22 - 5.81 MIL/uL Final   Hemoglobin 04/22/2022 8.2 (L)  13.0 - 17.0 g/dL Final   Comment: Reticulocyte Hemoglobin testing may be clinically indicated, consider ordering this additional test [id number]    HCT 04/22/2022 26.6 (L)  39.0 - 52.0 % Final   MCV 04/22/2022 74.9 (L)  80.0 - 100.0 fL Final   MCH 04/22/2022 23.1 (L)  26.0 - 34.0 pg Final   MCHC 04/22/2022 30.8  30.0 - 36.0 g/dL Final   RDW 51/88/4166 20.5 (H)  11.5 - 15.5 % Final   Platelets 04/22/2022 73 (L)  150 - 400 K/uL Final   Comment: Immature Platelet Fraction may be clinically indicated, consider ordering this additional test [id number] REPEATED TO VERIFY    nRBC 04/22/2022 0.0  0.0 - 0.2 % Final   Performed at [facility], [address], [place], [place] [address]   WBC 04/23/2022 3.7 (L)  4.0 - 10.5 K/uL Final   RBC 04/23/2022 3.61 (L)  4.22 - 5.81 MIL/uL Final   Hemoglobin 04/23/2022 8.4 (L)  13.0 - 17.0 g/dL Final   Comment: Reticulocyte Hemoglobin testing may be clinically indicated, consider ordering this additional test [id number]    HCT 04/23/2022 27.0 (L)  39.0 - 52.0 % Final   MCV 04/23/2022 74.8 (L)  80.0 - 100.0 fL Final   MCH 04/23/2022 23.3 (L)  26.0 - 34.0 pg Final   MCHC 04/23/2022 31.1  30.0 - 36.0 g/dL Final   RDW 54/06/8118 20.9 (H)  11.5 - 15.5 % Final   Platelets 04/23/2022 73  (L)  150 - 400 K/uL Final   Comment: Immature Platelet Fraction may be clinically indicated, consider ordering this additional test [id number] CONSISTENT WITH PREVIOUS RESULT REPEATED TO VERIFY    nRBC 04/23/2022 0.0  0.0 - 0.2 % Final   Performed at [facility], [address] [place], [place] [address]   WBC 04/26/2022 3.8 (L)  4.0 - 10.5 K/uL Final   RBC 04/26/2022 3.34 (L)  4.22 - 5.81 MIL/uL Final   Hemoglobin 04/26/2022 7.7 (L)  13.0 - 17.0 g/dL Final   Comment: Reticulocyte Hemoglobin testing may be clinically indicated, consider ordering this additional test [id number]    HCT 04/26/2022 25.1 (L)  39.0 - 52.0 % Final   MCV 04/26/2022 75.1 (L)  80.0 - 100.0 fL Final   MCH 04/26/2022 23.1 (L)  26.0 - 34.0 pg Final   MCHC 04/26/2022 30.7  30.0 - 36.0 g/dL Final   RDW 29/52/8413 20.3 (H)  11.5 - 15.5 % Final   Platelets 04/26/2022 98 (L)  150 - 400 K/uL Final   REPEATED TO VERIFY   nRBC 04/26/2022 0.0  0.0 - 0.2 % Final   Performed at [facility], [address], [place], [place] [address]   Sodium 04/26/2022 140  135 - 145 mmol/L Final   Potassium 04/26/2022 4.5  3.5 - 5.1 mmol/L Final   Chloride 04/26/2022 105  98 - 111 mmol/L Final   CO2 04/26/2022 28  22 - 32 mmol/L Final   Glucose, Bld 04/26/2022 101 (H)  70 - 99 mg/dL Final   Glucose reference range applies only to samples taken after fasting for at least 8 hours.   BUN 04/26/2022 12  8 - 23 mg/dL Final   Creatinine, Ser 04/26/2022 1.04  0.61 - 1.24 mg/dL Final   Calcium 02/72/5366 8.8 (L)  8.9 - 10.3 mg/dL Final   GFR, Estimated 04/26/2022 >60  >60 mL/min Final   Comment: (NOTE) Calculated using the CKD-EPI Creatinine Equation (2021)    Anion gap 04/26/2022 7  5 - 15 Final   Performed at [facility], [address], [place], [place] [address]   WBC 04/27/2022 3.8 (L)  4.0 - 10.5 K/uL Final   RBC 04/27/2022 3.59 (L)  4.22 - 5.81 MIL/uL Final   Hemoglobin 04/27/2022 8.2 (L)  13.0 - 17.0 g/dL Final    Comment: Reticulocyte Hemoglobin testing may be clinically indicated, consider ordering this additional test [id number]    HCT 04/27/2022 27.1 (L)  39.0 - 52.0 % Final   MCV 04/27/2022 75.5 (L)  80.0 - 100.0 fL Final   MCH 04/27/2022 22.8 (L)  26.0 - 34.0 pg Final   MCHC 04/27/2022 30.3  30.0 - 36.0 g/dL Final   RDW 87/56/4332 20.1 (H)  11.5 - 15.5 % Final   Platelets 04/27/2022 114 (L)  150 - 400 K/uL Final   REPEATED TO VERIFY   nRBC 04/27/2022 0.0  0.0 - 0.2 % Final   Performed at [facility], [address], [place], [place] [address]   Sodium 04/27/2022 139  135 - 145 mmol/L Final   Potassium 04/27/2022 4.2  3.5 - 5.1 mmol/L Final   Chloride 04/27/2022 108  98 - 111 mmol/L Final   CO2 04/27/2022 25  22 - 32 mmol/L Final   Glucose, Bld 04/27/2022 117 (H)  70 - 99 mg/dL Final   Glucose reference range applies only to samples taken after fasting for at least 8 hours.   BUN 04/27/2022 9  8 - 23 mg/dL Final   Creatinine, Ser 04/27/2022 1.16  0.61 - 1.24 mg/dL Final   Calcium 60/45/4098 8.7 (L)  8.9 - 10.3 mg/dL Final   GFR, Estimated 04/27/2022 >60  >60 mL/min Final   Comment: (NOTE) Calculated using the CKD-EPI Creatinine Equation (2021)    Anion gap 04/27/2022 6  5 - 15 Final   Performed at [facility], [address] [place], [place] [address]   WBC 04/28/2022 4.5  4.0 - 10.5 K/uL Final   RBC 04/28/2022 3.39 (L)  4.22 - 5.81 MIL/uL Final   Hemoglobin 04/28/2022 7.7 (L)  13.0 - 17.0 g/dL Final   Comment: Reticulocyte Hemoglobin testing may be clinically indicated, consider ordering this additional test [id number]    HCT 04/28/2022 25.4 (L)  39.0 - 52.0 % Final   MCV 04/28/2022 74.9 (L)  80.0 - 100.0 fL Final   MCH 04/28/2022 22.7 (L)  26.0 - 34.0 pg Final   MCHC 04/28/2022 30.3  30.0 - 36.0 g/dL Final   RDW 30/86/5784 19.9 (H)  11.5 - 15.5 % Final   Platelets 04/28/2022 125 (L)  150 - 400 K/uL Final   REPEATED TO VERIFY   nRBC 04/28/2022 0.0  0.0 - 0.2 %  Final   Performed at [facility], [address], [place], [place] [address]   WBC 04/29/2022 4.3  4.0 - 10.5 K/uL Final   RBC 04/29/2022 3.39 (L)  4.22 - 5.81 MIL/uL Final   Hemoglobin 04/29/2022 7.8 (L)  13.0 - 17.0 g/dL Final   Comment: Reticulocyte Hemoglobin testing may be clinically indicated, consider ordering this additional test [id number]    HCT 04/29/2022 25.1 (L)  39.0 - 52.0 % Final   MCV 04/29/2022 74.0 (L)  80.0 - 100.0 fL Final   MCH 04/29/2022 23.0 (L)  26.0 - 34.0 pg Final   MCHC 04/29/2022 31.1  30.0 - 36.0 g/dL Final   RDW 40/07/2724 19.9 (H)  11.5 - 15.5 % Final   Platelets 04/29/2022 125 (L)  150 - 400 K/uL Final   REPEATED TO VERIFY   nRBC 04/29/2022 0.0  0.0 - 0.2 % Final   Performed at [facility], [address], [place], [place] [address]   WBC 05/01/2022 3.6 (L)  4.0 - 10.5 K/uL Final   RBC 05/01/2022 3.13 (L)  4.22 - 5.81 MIL/uL Final   Hemoglobin 05/01/2022 7.1 (L)  13.0 - 17.0 g/dL Final   Comment: Reticulocyte Hemoglobin testing may be clinically indicated, consider ordering this additional test [id number]    HCT 05/01/2022 23.0 (L)  39.0 - 52.0 % Final   MCV 05/01/2022 73.5 (L)  80.0 - 100.0 fL Final   MCH 05/01/2022 22.7 (L)  26.0 - 34.0 pg Final   MCHC 05/01/2022 30.9  30.0 - 36.0  g/dL Final   RDW 16/07/9603 19.8 (H)  11.5 - 15.5 % Final   Platelets 05/01/2022 149 (L)  150 - 400 K/uL Final   REPEATED TO VERIFY   nRBC 05/01/2022 0.0  0.0 - 0.2 % Final   Performed at [facility], [address] [place], [place] [address]   Sodium 05/01/2022 137  135 - 145 mmol/L Final   Potassium 05/01/2022 4.0  3.5 - 5.1 mmol/L Final   Chloride 05/01/2022 109  98 - 111 mmol/L Final   CO2 05/01/2022 23  22 - 32 mmol/L Final   Glucose, Bld 05/01/2022 98  70 - 99 mg/dL Final   Glucose reference range applies only to samples taken after fasting for at least 8 hours.   BUN 05/01/2022 14  8 - 23 mg/dL Final   Creatinine, Ser 05/01/2022 0.83   0.61 - 1.24 mg/dL Final   Calcium 11/91/4782 8.4 (L)  8.9 - 10.3 mg/dL Final   GFR, Estimated 05/01/2022 >60  >60 mL/min Final   Comment: (NOTE) Calculated using the CKD-EPI Creatinine Equation (2021)    Anion gap 05/01/2022 5  5 - 15 Final   Performed at [facility], [address] [place], [place] [address]   WBC 05/02/2022 7.8  4.0 - 10.5 K/uL Final   RBC 05/02/2022 3.23 (L)  4.22 - 5.81 MIL/uL Final   Hemoglobin 05/02/2022 7.2 (L)  13.0 - 17.0 g/dL Final   Comment: Reticulocyte Hemoglobin testing may be clinically indicated, consider ordering this additional test [id number]    HCT 05/02/2022 23.6 (L)  39.0 - 52.0 % Final   MCV 05/02/2022 73.1 (L)  80.0 - 100.0 fL Final   MCH 05/02/2022 22.3 (L)  26.0 - 34.0 pg Final   MCHC 05/02/2022 30.5  30.0 - 36.0 g/dL Final   RDW 69/62/9528 19.5 (H)  11.5 - 15.5 % Final   Platelets 05/02/2022 153  150 - 400 K/uL Final   REPEATED TO VERIFY   nRBC 05/02/2022 0.0  0.0 - 0.2 % Final   Performed at [facility], [address], [place], [place] [address]  Admission on 03/31/2022, Discharged on 04/02/2022  Component Date Value Ref Range Status   Sodium 03/31/2022 140  135 - 145 mmol/L Final   Potassium 03/31/2022 3.6  3.5 - 5.1 mmol/L Final   Chloride 03/31/2022 108  98 - 111 mmol/L Final   CO2 03/31/2022 19 (L)  22 - 32 mmol/L Final   Glucose, Bld 03/31/2022 84  70 - 99 mg/dL Final   Glucose reference range applies only to samples taken after fasting for at least 8 hours.   BUN 03/31/2022 9  8 - 23 mg/dL Final   Creatinine, Ser 03/31/2022 0.86  0.61 - 1.24 mg/dL Final   Calcium 40/07/2724 8.6 (L)  8.9 - 10.3 mg/dL Final   Total Protein 36/64/4034 7.2  6.5 - 8.1 g/dL Final   Albumin 74/25/9563 3.5  3.5 - 5.0 g/dL Final   AST 87/56/4332 56 (H)  15 - 41 U/L Final   ALT 03/31/2022 35  0 - 44 U/L Final   Alkaline Phosphatase 03/31/2022 61  38 - 126 U/L Final   Total Bilirubin 03/31/2022 2.0 (H)  0.3 - 1.2 mg/dL Final   GFR,  Estimated 03/31/2022 >60  >60 mL/min Final   Comment: (NOTE) Calculated using the CKD-EPI Creatinine Equation (2021)    Anion gap 03/31/2022 13  5 - 15 Final   Performed at [facility], [address] [place], [place] [address]   Alcohol, Ethyl (B) 03/31/2022 203 (H)  <10 mg/dL Final   Comment: (NOTE) Lowest detectable limit for serum alcohol is 10 mg/dL.  For medical purposes only. Performed at [facility], [address] [place], [place] [address]    Salicylate Lvl 03/31/2022 <7.0 (L)  7.0 - 30.0 mg/dL Final   Performed at [facility], [address], [place], [place] [address]   Acetaminophen (Tylenol), Serum 03/31/2022 <10 (L)  10 - 30 ug/mL Final   Comment: (NOTE) Therapeutic concentrations vary significantly. A range of 10-30 ug/mL  may be an effective concentration for many patients. However, some  are best treated at concentrations outside of this range. Acetaminophen concentrations >150 ug/mL at 4 hours after ingestion  and >50 ug/mL at 12 hours after ingestion are often associated with  toxic reactions.  Performed at [facility], [address], [place], [place] [address]    WBC 03/31/2022 4.4  4.0 - 10.5 K/uL Final   RBC 03/31/2022 5.10  4.22 - 5.81 MIL/uL Final   Hemoglobin 03/31/2022 11.8 (L)  13.0 - 17.0 g/dL Final   HCT 04/09/7627 38.5 (L)  39.0 - 52.0 % Final   MCV 03/31/2022 75.5 (L)  80.0 - 100.0 fL Final   MCH 03/31/2022 23.1 (L)  26.0 - 34.0 pg Final   MCHC 03/31/2022 30.6  30.0 - 36.0 g/dL Final   RDW 31/51/7616 19.4 (H)  11.5 - 15.5 % Final   Platelets 03/31/2022 102 (L)  150 - 400 K/uL Final   Comment: Immature Platelet Fraction may be clinically indicated, consider ordering this additional test [id number] REPEATED TO VERIFY PLATELET COUNT CONFIRMED BY SMEAR    nRBC 03/31/2022 0.0  0.0 - 0.2 % Final   Performed at [facility], [address] [place], [place] [address]   Opiates 03/31/2022 NONE DETECTED  NONE  DETECTED Final   Cocaine 03/31/2022 NONE DETECTED  NONE DETECTED Final   Benzodiazepines 03/31/2022 NONE DETECTED  NONE DETECTED Final   Amphetamines 03/31/2022 NONE DETECTED  NONE DETECTED Final   Tetrahydrocannabinol 03/31/2022 NONE DETECTED  NONE DETECTED Final   Barbiturates 03/31/2022 NONE DETECTED  NONE DETECTED Final   Comment: (NOTE) DRUG SCREEN FOR MEDICAL PURPOSES ONLY.  IF CONFIRMATION IS NEEDED FOR ANY PURPOSE, NOTIFY LAB WITHIN 5 DAYS.  LOWEST DETECTABLE LIMITS FOR URINE DRUG SCREEN Drug Class                     Cutoff (ng/mL) Amphetamine and metabolites    1000 Barbiturate and metabolites    200 Benzodiazepine                 200 Tricyclics and metabolites     300 Opiates and metabolites        300 Cocaine and metabolites        300 THC                            50 Performed at [facility] Lab, [address], [place], [place] [address]    SARS Coronavirus 2 by RT PCR 03/31/2022 NEGATIVE  NEGATIVE Final   Comment: (NOTE) SARS-CoV-2 target nucleic acids are NOT DETECTED.  The SARS-CoV-2 RNA is generally detectable in upper and lower respiratory specimens during the acute phase of infection. The lowest concentration of SARS-CoV-2 viral copies this assay can  detect is 250 copies / mL. A negative result does not preclude SARS-CoV-2 infection and should not be used as the sole basis for treatment or other patient management decisions.  A negative result may occur with improper specimen collection / handling, submission of specimen other than nasopharyngeal swab, presence of viral mutation(s) within the areas targeted by this assay, and inadequate number of viral copies (<250 copies / mL). A negative result must be combined with clinical observations, patient history, and epidemiological information.  Fact Sheet for Patients:   RoadLapTop.co.za  Fact Sheet for Healthcare Providers: http://kim-miller.com/  This  test is not yet approved or                           cleared by the [place] FDA and has been authorized for detection and/or diagnosis of SARS-CoV-2 by FDA under an Emergency Use Authorization (EUA).  This EUA will remain in effect (meaning this test can be used) for the duration of the COVID-19 declaration under Section 564(b)(1) of the Act, 21 U.S.C. section 360bbb-3(b)(1), unless the authorization is terminated or revoked sooner.  Performed at [facility], [address], [place], [place] [address]   Admission on 01/22/2022, Discharged on 01/22/2022  Component Date Value Ref Range Status   Lipase 01/22/2022 44  11 - 51 U/L Final   Performed at [facility] [place] [facility], [address] [place], [place] [address]   Sodium 01/22/2022 138  135 - 145 mmol/L Final   Potassium 01/22/2022 3.4 (L)  3.5 - 5.1 mmol/L Final   Chloride 01/22/2022 105  98 - 111 mmol/L Final   CO2 01/22/2022 24  22 - 32 mmol/L Final   Glucose, Bld 01/22/2022 90  70 - 99 mg/dL Final   Glucose reference range applies only to samples taken after fasting for at least 8 hours.   BUN 01/22/2022 10  8 - 23 mg/dL Final   Creatinine, Ser 01/22/2022 0.87  0.61 - 1.24 mg/dL Final   Calcium 62/83/1517 8.9  8.9 - 10.3 mg/dL Final   Total Protein 61/60/7371 7.9  6.5 - 8.1 g/dL Final   Albumin 04/11/9484 3.7  3.5 - 5.0 g/dL Final   AST 46/27/0350 43 (H)  15 - 41 U/L Final   ALT 01/22/2022 31  0 - 44 U/L Final   Alkaline Phosphatase 01/22/2022 74  38 - 126 U/L Final   Total Bilirubin 01/22/2022 2.0 (H)  0.3 - 1.2 mg/dL Final   GFR, Estimated 01/22/2022 >60  >60 mL/min Final   Comment: (NOTE) Calculated using the CKD-EPI Creatinine Equation (2021)    Anion gap 01/22/2022 9  5 - 15 Final   Performed at [facility], [address] [place], [place] [address]   WBC 01/22/2022 4.7  4.0 - 10.5 K/uL Final   RBC 01/22/2022 5.84 (H)  4.22 - 5.81 MIL/uL Final   Hemoglobin 01/22/2022 13.1   13.0 - 17.0 g/dL Final   HCT 82/99/3716 43.9  39.0 - 52.0 % Final   MCV 01/22/2022 75.2 (L)  80.0 - 100.0 fL Final   MCH 01/22/2022 22.4 (L)  26.0 - 34.0 pg Final   MCHC 01/22/2022 29.8 (L)  30.0 - 36.0 g/dL Final   RDW 96/78/9381 19.7 (H)  11.5 - 15.5 % Final   Platelets 01/22/2022 128 (L)  150 - 400 K/uL Final   nRBC 01/22/2022 0.0  0.0 - 0.2 % Final   Performed at [facility], [address], [place], [place] [address]   Color, Urine 01/22/2022 STRAW (A)  YELLOW Final   APPearance 01/22/2022 CLEAR  CLEAR Final   Specific Gravity, Urine 01/22/2022 1.002 (L)  1.005 - 1.030 Final   pH 01/22/2022 5.0  5.0 - 8.0 Final   Glucose, UA 01/22/2022 NEGATIVE  NEGATIVE mg/dL Final   Hgb urine dipstick 01/22/2022 NEGATIVE  NEGATIVE Final   Bilirubin Urine 01/22/2022 NEGATIVE  NEGATIVE Final   Ketones, ur 01/22/2022 NEGATIVE  NEGATIVE mg/dL Final   Protein, ur 60/45/4098 NEGATIVE  NEGATIVE mg/dL Final   Nitrite 11/91/4782 NEGATIVE  NEGATIVE Final   Leukocytes,Ua 01/22/2022 NEGATIVE  NEGATIVE Final   Performed at [facility], [address], [place], [place] [address]   Opiates 01/22/2022 NONE DETECTED  NONE DETECTED Final   Cocaine 01/22/2022 NONE DETECTED  NONE DETECTED Final   Benzodiazepines 01/22/2022 NONE DETECTED  NONE DETECTED Final   Amphetamines 01/22/2022 NONE DETECTED  NONE DETECTED Final   Tetrahydrocannabinol 01/22/2022 NONE DETECTED  NONE DETECTED Final   Barbiturates 01/22/2022 NONE DETECTED  NONE DETECTED Final   Comment: (NOTE) DRUG SCREEN FOR MEDICAL PURPOSES ONLY.  IF CONFIRMATION IS NEEDED FOR ANY PURPOSE, NOTIFY LAB WITHIN 5 DAYS.  LOWEST DETECTABLE LIMITS FOR URINE DRUG SCREEN Drug Class                     Cutoff (ng/mL) Amphetamine and metabolites    1000 Barbiturate and metabolites    200 Benzodiazepine                 200 Tricyclics and metabolites     300 Opiates and metabolites        300 Cocaine and metabolites         300 THC                            50 Performed at [facility], [address], [place], [place] [address]    Alcohol, Ethyl (B) 01/22/2022 134 (H)  <10 mg/dL Final   Comment: (NOTE) Lowest detectable limit for serum alcohol is 10 mg/dL.  For medical purposes only. Performed at [facility], [address], [place], [place] [address]    Specimen Description 01/22/2022    Final                   Value:URINE, CLEAN CATCH Performed at [facility] [address] [place], [place] [address]    Special Requests 01/22/2022    Final                   Value:NONE Performed at [facility], [address] [place], [place] [address]    Culture 01/22/2022    Final                   Value:NO GROWTH Performed at [facility], [address] [place], [place] [address]    Report Status 01/22/2022 01/23/2022 FINAL   Final   Ammonia 01/22/2022 18  9 - 35 umol/L Final   Performed at [facility], [address], [place], [place] [address]   Acetaminophen (Tylenol), Serum 01/22/2022 <10 (L)  10 - 30 ug/mL Final   Comment: (NOTE) Therapeutic concentrations vary significantly. A range of 10-30 ug/mL  may be an effective concentration for many patients. However, some  are  best treated at concentrations outside of this range. Acetaminophen concentrations >150 ug/mL at 4 hours after ingestion  and >50 ug/mL at 12 hours after ingestion are often associated with  toxic reactions.  Performed at [facility], [address], [place], [place] [address]    Salicylate Lvl 01/22/2022 <7.0 (L)  7.0 - 30.0 mg/dL Final   Performed at [facility], [address] [address], [place], [place] [address]  Admission on 01/10/2022, Discharged on 01/10/2022  Component Date Value Ref Range Status   Sodium 01/10/2022 131 (L)  135 - 145 mmol/L Final   Potassium 01/10/2022 4.0  3.5 - 5.1  mmol/L Final   Chloride 01/10/2022 98  98 - 111 mmol/L Final   CO2 01/10/2022 23  22 - 32 mmol/L Final   Glucose, Bld 01/10/2022 84  70 - 99 mg/dL Final   Glucose reference range applies only to samples taken after fasting for at least 8 hours.   BUN 01/10/2022 8  8 - 23 mg/dL Final   Creatinine, Ser 01/10/2022 0.76  0.61 - 1.24 mg/dL Final   Calcium 09/81/1914 9.1  8.9 - 10.3 mg/dL Final   Total Protein 78/29/5621 7.8  6.5 - 8.1 g/dL Final   Albumin 30/86/5784 3.8  3.5 - 5.0 g/dL Final   AST 69/62/9528 55 (H)  15 - 41 U/L Final   ALT 01/10/2022 36  0 - 44 U/L Final   Alkaline Phosphatase 01/10/2022 76  38 - 126 U/L Final   Total Bilirubin 01/10/2022 3.3 (H)  0.3 - 1.2 mg/dL Final   GFR, Estimated 01/10/2022 >60  >60 mL/min Final   Comment: (NOTE) Calculated using the CKD-EPI Creatinine Equation (2021)    Anion gap 01/10/2022 10  5 - 15 Final   Performed at [facility], [address], [place], [place] [address]   WBC 01/10/2022 5.7  4.0 - 10.5 K/uL Final   RBC 01/10/2022 5.16  4.22 - 5.81 MIL/uL Final   Hemoglobin 01/10/2022 11.8 (L)  13.0 - 17.0 g/dL Final   HCT 40/07/2724 39.0  39.0 - 52.0 % Final   MCV 01/10/2022 75.6 (L)  80.0 - 100.0 fL Final   MCH 01/10/2022 22.9 (L)  26.0 - 34.0 pg Final   MCHC 01/10/2022 30.3  30.0 - 36.0 g/dL Final   RDW 36/64/4034 18.4 (H)  11.5 - 15.5 % Final   Platelets 01/10/2022 138 (L)  150 - 400 K/uL Final   REPEATED TO VERIFY   nRBC 01/10/2022 0.0  0.0 - 0.2 % Final   Neutrophils Relative % 01/10/2022 61  % Final   Neutro Abs 01/10/2022 3.5  1.7 - 7.7 K/uL Final   Lymphocytes Relative 01/10/2022 27  % Final   Lymphs Abs 01/10/2022 1.5  0.7 - 4.0 K/uL Final   Monocytes Relative 01/10/2022 10  % Final   Monocytes Absolute 01/10/2022 0.6  0.1 - 1.0 K/uL Final   Eosinophils Relative 01/10/2022 1  % Final   Eosinophils Absolute 01/10/2022 0.0  0.0 - 0.5 K/uL Final   Basophils Relative 01/10/2022 1  % Final   Basophils Absolute 01/10/2022  0.1  0.0 - 0.1 K/uL Final   Immature Granulocytes 01/10/2022 0  % Final   Abs Immature Granulocytes 01/10/2022 0.02  0.00 - 0.07 K/uL Final   Performed at [facility], [address], [place], [place] [address]   Opiates 01/10/2022 NONE DETECTED  NONE DETECTED Final   Cocaine 01/10/2022 NONE DETECTED  NONE  DETECTED Final   Benzodiazepines 01/10/2022 NONE DETECTED  NONE DETECTED Final   Amphetamines 01/10/2022 NONE DETECTED  NONE DETECTED Final   Tetrahydrocannabinol 01/10/2022 NONE DETECTED  NONE DETECTED Final   Barbiturates 01/10/2022 NONE DETECTED  NONE DETECTED Final   Comment: (NOTE) DRUG SCREEN FOR MEDICAL PURPOSES ONLY.  IF CONFIRMATION IS NEEDED FOR ANY PURPOSE, NOTIFY LAB WITHIN 5 DAYS.  LOWEST DETECTABLE LIMITS FOR URINE DRUG SCREEN Drug Class                     Cutoff (ng/mL) Amphetamine and metabolites    1000 Barbiturate and metabolites    200 Benzodiazepine                 200 Tricyclics and metabolites     300 Opiates and metabolites        300 Cocaine and metabolites        300 THC                            50 Performed at [facility], [address], [place], [place] [address]    Color, Urine 01/10/2022 YELLOW  YELLOW Final   APPearance 01/10/2022 CLEAR  CLEAR Final   Specific Gravity, Urine 01/10/2022 1.003 (L)  1.005 - 1.030 Final   pH 01/10/2022 5.0  5.0 - 8.0 Final   Glucose, UA 01/10/2022 NEGATIVE  NEGATIVE mg/dL Final   Hgb urine dipstick 01/10/2022 SMALL (A)  NEGATIVE Final   Bilirubin Urine 01/10/2022 NEGATIVE  NEGATIVE Final   Ketones, ur 01/10/2022 5 (A)  NEGATIVE mg/dL Final   Protein, ur 60/45/4098 NEGATIVE  NEGATIVE mg/dL Final   Nitrite 11/91/4782 NEGATIVE  NEGATIVE Final   Leukocytes,Ua 01/10/2022 NEGATIVE  NEGATIVE Final   WBC, UA 01/10/2022 0-5  0 - 5 WBC/hpf Final   Bacteria, UA 01/10/2022 NONE SEEN  NONE SEEN Final   Squamous Epithelial / LPF 01/10/2022 0-5  0 - 5 Final   Performed at [facility], [address] [place], [place] [address]   Lipase 01/10/2022 37  11 - 51 U/L Final   Performed at [facility], [address] [place], [place] [address]   Total CK 01/10/2022 163  49 - 397 U/L Final   Performed at [facility], [address], [place], [place] [address]  There may be more visits with results that are not included.    Allergies: Carrot [daucus carota]  PTA Medications: (Not in a hospital admission)   Medical Decision Making  Patient is voluntary.  Patient admitted to the [facility] continuous assessment for mood stabilization and safety.  Lab Orders         Resp Panel by RT-PCR (Flu A&B, Covid) Anterior Nasal Swab         CBC with Differential/Platelet         Comprehensive metabolic panel         Hemoglobin A1c         Ethanol         POCT Urine Drug Screen - (I-Screen)    EKG  Medications:  Will restart Norvasc 5 mg p.o. daily for hypertension Will restart Seroquel at 100 mg p.o. nightly for mood disorder Will order CIWA for alcohol use Will order ativan 1 mg po q 6 hours for CIWA greater than 10. Labs pending collection.   Meds ordered this encounter  Medications   acetaminophen (  TYLENOL) tablet 650 mg   alum & mag hydroxide-simeth (MAALOX/MYLANTA) 200-200-20 MG/5ML suspension 30 mL   magnesium hydroxide (MILK OF MAGNESIA) suspension 30 mL   hydrOXYzine (ATARAX) tablet 25 mg   thiamine (VITAMIN B1) injection 100 mg   thiamine (VITAMIN B1) tablet 100 mg   multivitamin with minerals tablet 1 tablet   LORazepam (ATIVAN) tablet 1 mg   loperamide (IMODIUM) capsule 2-4 mg   ondansetron (ZOFRAN-ODT) disintegrating tablet 4 mg   amLODipine (NORVASC) tablet 5 mg   ferrous sulfate tablet 325 mg   QUEtiapine (SEROQUEL) tablet 100 mg    Recommendations  Based on my evaluation the patient does not appear to have an emergency medical condition.  Iridessa Harrow L, NP 06/10/22  1:00 PM

## 2022-06-11 DIAGNOSIS — R45851 Suicidal ideations: Secondary | ICD-10-CM

## 2022-06-11 DIAGNOSIS — F10139 Alcohol abuse with withdrawal, unspecified: Secondary | ICD-10-CM | POA: Diagnosis present

## 2022-06-11 DIAGNOSIS — F10939 Alcohol use, unspecified with withdrawal, unspecified: Secondary | ICD-10-CM | POA: Diagnosis present

## 2022-06-11 DIAGNOSIS — F1994 Other psychoactive substance use, unspecified with psychoactive substance-induced mood disorder: Secondary | ICD-10-CM | POA: Diagnosis not present

## 2022-06-11 DIAGNOSIS — F1020 Alcohol dependence, uncomplicated: Secondary | ICD-10-CM

## 2022-06-11 DIAGNOSIS — F1090 Alcohol use, unspecified, uncomplicated: Secondary | ICD-10-CM | POA: Diagnosis not present

## 2022-06-11 LAB — COMPREHENSIVE METABOLIC PANEL
ALT: 18 U/L (ref 0–44)
AST: 30 U/L (ref 15–41)
Albumin: 3.5 g/dL (ref 3.5–5.0)
Alkaline Phosphatase: 59 U/L (ref 38–126)
Anion gap: 9 (ref 5–15)
BUN: 12 mg/dL (ref 8–23)
CO2: 23 mmol/L (ref 22–32)
Calcium: 8.9 mg/dL (ref 8.9–10.3)
Chloride: 104 mmol/L (ref 98–111)
Creatinine, Ser: 0.93 mg/dL (ref 0.61–1.24)
GFR, Estimated: >60 mL/min (ref >60)
Glucose, Bld: 141 mg/dL — ABNORMAL HIGH (ref 70–99)
Potassium: 3.7 mmol/L (ref 3.5–5.1)
Sodium: 136 mmol/L (ref 135–145)
Total Bilirubin: 1.3 mg/dL — ABNORMAL HIGH (ref 0.3–1.2)
Total Protein: 7.1 g/dL (ref 6.5–8.1)

## 2022-06-11 LAB — CBC WITH DIFFERENTIAL/PLATELET
Abs Immature Granulocytes: 0.01 10*3/uL (ref 0.00–0.07)
Basophils Absolute: 0.1 10*3/uL (ref 0.0–0.1)
Basophils Relative: 1 %
Eosinophils Absolute: 0.2 10*3/uL (ref 0.0–0.5)
Eosinophils Relative: 5 %
HCT: 31.4 % — ABNORMAL LOW (ref 39.0–52.0)
Hemoglobin: 9.2 g/dL — ABNORMAL LOW (ref 13.0–17.0)
Immature Granulocytes: 0 %
Lymphocytes Relative: 27 %
Lymphs Abs: 1.1 10*3/uL (ref 0.7–4.0)
MCH: 20.2 pg — ABNORMAL LOW (ref 26.0–34.0)
MCHC: 29.3 g/dL — ABNORMAL LOW (ref 30.0–36.0)
MCV: 68.9 fL — ABNORMAL LOW (ref 80.0–100.0)
Monocytes Absolute: 0.3 10*3/uL (ref 0.1–1.0)
Monocytes Relative: 8 %
Neutro Abs: 2.3 10*3/uL (ref 1.7–7.7)
Neutrophils Relative %: 59 %
Platelets: 121 10*3/uL — ABNORMAL LOW (ref 150–400)
RBC: 4.56 MIL/uL (ref 4.22–5.81)
RDW: 19.4 % — ABNORMAL HIGH (ref 11.5–15.5)
WBC: 3.9 10*3/uL — ABNORMAL LOW (ref 4.0–10.5)
nRBC: 0 % (ref 0.0–0.2)

## 2022-06-11 LAB — ETHANOL: Alcohol, Ethyl (B): <10 mg/dL (ref <10)

## 2022-06-11 MED ORDER — LORAZEPAM 1 MG PO TABS
1.0000 mg | ORAL_TABLET | Freq: Every day | ORAL | Status: AC
  Administered 2022-06-14: 1 mg via ORAL
  Filled 2022-06-11: qty 1

## 2022-06-11 MED ORDER — QUETIAPINE FUMARATE 100 MG PO TABS
100.0000 mg | ORAL_TABLET | Freq: Every day | ORAL | Status: DC
  Administered 2022-06-12: 100 mg via ORAL
  Filled 2022-06-11: qty 1

## 2022-06-11 MED ORDER — LORAZEPAM 1 MG PO TABS
1.0000 mg | ORAL_TABLET | Freq: Three times a day (TID) | ORAL | Status: AC
  Administered 2022-06-12 (×3): 1 mg via ORAL
  Filled 2022-06-11 (×3): qty 1

## 2022-06-11 MED ORDER — LORAZEPAM 1 MG PO TABS
1.0000 mg | ORAL_TABLET | Freq: Two times a day (BID) | ORAL | Status: AC
  Administered 2022-06-13 (×2): 1 mg via ORAL
  Filled 2022-06-11 (×2): qty 1

## 2022-06-11 MED ORDER — LORAZEPAM 1 MG PO TABS
1.0000 mg | ORAL_TABLET | Freq: Four times a day (QID) | ORAL | Status: AC
  Administered 2022-06-11 (×4): 1 mg via ORAL
  Filled 2022-06-11 (×3): qty 1

## 2022-06-11 MED ORDER — QUETIAPINE FUMARATE 50 MG PO TABS
50.0000 mg | ORAL_TABLET | Freq: Every day | ORAL | Status: AC
  Administered 2022-06-11: 50 mg via ORAL
  Filled 2022-06-11: qty 1

## 2022-06-11 MED ORDER — POLYVINYL ALCOHOL 1.4 % OP SOLN
1.0000 [drp] | Freq: Every day | OPHTHALMIC | Status: DC
  Administered 2022-06-11 – 2022-06-12 (×2): 1 [drp] via OPHTHALMIC
  Filled 2022-06-11: qty 15

## 2022-06-11 NOTE — ED Notes (Signed)
Pt sleeping@this time. Breathing even and unlabored. Will continue to monitor for safety 

## 2022-06-11 NOTE — ED Notes (Signed)
Lab spec obtained

## 2022-06-11 NOTE — ED Notes (Signed)
Pt given snack of cheese and cracker. States he is feeling " a little better" and is looking forward to going to [facility]

## 2022-06-11 NOTE — ED Notes (Signed)
Pt sitting up and eating cereal at this time.

## 2022-06-11 NOTE — ED Notes (Signed)
Pt sleeping at present, no distress noted.  Monitoring for safety. 

## 2022-06-11 NOTE — ED Notes (Signed)
The provider woke up pt and is assessing him at this time.

## 2022-06-11 NOTE — ED Notes (Signed)
Pt sleeping quietly breathing even and unlabored.  Pt is in view of nursing station. No distress noted.  

## 2022-06-11 NOTE — ED Provider Notes (Signed)
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 SARS-Cov-2 infection and should not be used as the sole basis for treatment or other patient management decisions. A negative result may occur with  improper specimen collection/handling, submission of specimen other than nasopharyngeal swab, presence of viral mutation(s) within the areas targeted by this assay, and inadequate number of viral copies(<138  copies/mL). A negative  result must be combined with clinical observations, patient history, and epidemiological information. The expected result is Negative.  Fact Sheet for Patients:  BloggerCourse.comhttps://www.fda.gov/media/152166/download  Fact Sheet for Healthcare Providers:  SeriousBroker.ithttps://www.fda.gov/media/152162/download  This test is no                          t yet approved or cleared by the [place] FDA and  has been authorized for detection and/or diagnosis of SARS-CoV-2 by FDA under an Emergency Use Authorization (EUA). This EUA will remain  in effect (meaning this test can be used) for the duration of the COVID-19 declaration under Section 564(b)(1) of the Act, 21 U.S.C.section 360bbb-3(b)(1), unless the authorization is terminated  or revoked sooner.       Influenza A by PCR 05/24/2022 NEGATIVE  NEGATIVE Final   Influenza B by PCR 05/24/2022 NEGATIVE  NEGATIVE Final   Comment: (NOTE) The Xpert Xpress SARS-CoV-2/FLU/RSV plus assay is intended as an aid in the diagnosis of influenza from Nasopharyngeal swab specimens and should not be used as a sole basis for treatment. Nasal washings and aspirates are unacceptable for Xpert Xpress SARS-CoV-2/FLU/RSV testing.  Fact Sheet for Patients: BloggerCourse.comhttps://www.fda.gov/media/152166/download  Fact Sheet for Healthcare Providers: SeriousBroker.ithttps://www.fda.gov/media/152162/download  This test is not yet approved or cleared by the [place] FDA and has been authorized for detection and/or diagnosis of SARS-CoV-2 by FDA under an Emergency Use Authorization (EUA). This EUA will remain in effect (meaning this test can be used) for the duration of the COVID-19 declaration under Section 564(b)(1) of the Act, 21 U.S.C. section 360bbb-3(b)(1), unless the authorization is terminated or revoked.  Performed at [facility], [address] [place], [place] [address]    WBC 05/24/2022 5.3  4.0 - 10.5 K/uL Final   RBC 05/24/2022 3.98 (L)  4.22 - 5.81 MIL/uL Final    Hemoglobin 05/24/2022 8.4 (L)  13.0 - 17.0 g/dL Final   Comment: Reticulocyte Hemoglobin testing may be clinically indicated, consider ordering this additional test [id number]    HCT 05/24/2022 27.4 (L)  39.0 - 52.0 % Final   MCV 05/24/2022 68.8 (L)  80.0 - 100.0 fL Final   MCH 05/24/2022 21.1 (L)  26.0 - 34.0 pg Final   MCHC 05/24/2022 30.7  30.0 - 36.0 g/dL Final   RDW 11/91/478208/05/2022 19.3 (H)  11.5 - 15.5 % Final   Platelets 05/24/2022 149 (L)  150 - 400 K/uL Final   REPEATED TO VERIFY   nRBC 05/24/2022 0.0  0.0 - 0.2 % Final   Neutrophils Relative % 05/24/2022 39  % Final   Neutro Abs 05/24/2022 2.0  1.7 - 7.7 K/uL Final   Lymphocytes Relative 05/24/2022 45  % Final   Lymphs Abs 05/24/2022 2.4  0.7 - 4.0 K/uL Final   Monocytes Relative 05/24/2022 11  % Final   Monocytes Absolute 05/24/2022 0.6  0.1 - 1.0 K/uL Final   Eosinophils Relative 05/24/2022 3  % Final   Eosinophils Absolute 05/24/2022 0.2  0.0 - 0.5 K/uL Final   Basophils Relative 05/24/2022 2  % Final   Basophils Absolute 05/24/2022 0.1  0.0 - 0.1 K/uL Final   Immature Granulocytes 05/24/2022 0  % Final   Abs Immature Granulocytes 05/24/2022 0.01  0.00 - 0.07 K/uL Final   Performed at [facility], [address] [place], [place] [address]   Sodium 05/24/2022 139  135 - 145 mmol/L Final   Potassium 05/24/2022 3.9  3.5 - 5.1 mmol/L Final   Chloride 05/24/2022 108  98 - 111 mmol/L Final   CO2 05/24/2022 21 (L)  22 - 32 mmol/L Final   Glucose, Bld 05/24/2022 100 (H)  70 - 99 mg/dL Final   Glucose reference range applies only to samples taken after fasting for at least 8 hours.   BUN 05/24/2022 12  8 - 23 mg/dL Final   Creatinine, Ser 05/24/2022 1.04  0.61 - 1.24 mg/dL Final   Calcium 16/07/9603 9.0  8.9 - 10.3 mg/dL Final   Total Protein 54/06/8118 7.4  6.5 - 8.1 g/dL Final   Albumin 14/78/2956 3.6  3.5 - 5.0 g/dL Final   AST 21/30/8657 36  15 - 41 U/L Final   ALT 05/24/2022 26  0 - 44 U/L Final   Alkaline  Phosphatase 05/24/2022 77  38 - 126 U/L Final   Total Bilirubin 05/24/2022 1.2  0.3 - 1.2 mg/dL Final   GFR, Estimated 05/24/2022 >60  >60 mL/min Final   Comment: (NOTE) Calculated using the CKD-EPI Creatinine Equation (2021)    Anion gap 05/24/2022 10  5 - 15 Final   Performed at [facility], [address] [place], [place] [address]   Hgb A1c MFr Bld 05/24/2022 5.2  4.8 - 5.6 % Final   Comment: (NOTE) Pre diabetes:          5.7%-6.4%  Diabetes:              >6.4%  Glycemic control for   <7.0% adults with diabetes    Mean Plasma Glucose 05/24/2022 102.54  mg/dL Final   Performed at [facility], [address] [place], [place] [address]   Alcohol, Ethyl (B) 05/24/2022 99 (H)  <10 mg/dL Final   Comment: (NOTE) Lowest detectable limit for serum alcohol is 10 mg/dL.  For medical purposes only. Performed at [facility], [address] [place], [place] [address]    Hepatitis B Surface Ag 05/24/2022 NON REACTIVE  NON REACTIVE Final   HCV Ab 05/24/2022 Reactive (A)  NON REACTIVE Final   Comment: (NOTE) The CDC recommends that a Reactive HCV antibody result be followed up  with a HCV Nucleic Acid Amplification test.     Hep A IgM 05/24/2022 NON REACTIVE  NON REACTIVE Final   Hep B C IgM 05/24/2022 NON REACTIVE  NON REACTIVE Final   Performed at [facility], [address], [place], [place] [address]   POC Amphetamine UR 05/25/2022 None Detected  NONE DETECTED (Cut Off Level 1000 ng/mL) Final   POC Secobarbital (BAR) 05/25/2022 None Detected  NONE DETECTED (Cut Off Level 300 ng/mL) Final   POC Buprenorphine (BUP) 05/25/2022 None Detected  NONE DETECTED (Cut Off Level 10 ng/mL) Final   POC Oxazepam (BZO) 05/25/2022 Positive (A)  NONE DETECTED (Cut Off Level 300 ng/mL) Final   POC Cocaine UR 05/25/2022 Positive (A)  NONE DETECTED (Cut Off Level 300 ng/mL) Final   POC Methamphetamine UR 05/25/2022 None Detected  NONE DETECTED (Cut Off Level 1000 ng/mL)  Final   POC Morphine 05/25/2022 None Detected  NONE DETECTED (Cut Off Level 300 ng/mL) Final   POC Methadone UR 05/25/2022 None Detected  NONE DETECTED (Cut Off Level 300 ng/mL) Final   POC Oxycodone UR 05/25/2022 None Detected  NONE DETECTED (Cut Off Level 100 ng/mL) Final   POC Marijuana UR 05/25/2022 None Detected  NONE DETECTED (Cut Off Level 50 ng/mL) Final   SARSCOV2ONAVIRUS 2  AG 05/24/2022 NEGATIVE  NEGATIVE Final   Comment: (NOTE) SARS-CoV-2 antigen NOT DETECTED.   Negative results are presumptive.  Negative results do not preclude SARS-CoV-2 infection and should not be used as the sole basis for treatment or other patient management decisions, including infection  control decisions, particularly in the presence of clinical signs and  symptoms consistent with COVID-19, or in those who have been in contact with the virus.  Negative results must be combined with clinical observations, patient history, and epidemiological information. The expected result is Negative.  Fact Sheet for Patients: https://www.jennings-kim.com/  Fact Sheet for Healthcare Providers: https://alexander-rogers.biz/  This test is not yet approved or cleared by the [place] FDA and  has been authorized for detection and/or diagnosis of SARS-CoV-2 by FDA under an Emergency Use Authorization (EUA).  This EUA will remain in effect (meaning this test can be used) for the duration of  the COV                          ID-19 declaration under Section 564(b)(1) of the Act, 21 U.S.C. section 360bbb-3(b)(1), unless the authorization is terminated or revoked sooner.     Cholesterol 05/24/2022 141  0 - 200 mg/dL Final   Triglycerides 45/40/9811 94  <150 mg/dL Final   HDL 91/47/8295 57  >40 mg/dL Final   Total CHOL/HDL Ratio 05/24/2022 2.5  RATIO Final   VLDL 05/24/2022 19  0 - 40 mg/dL Final   LDL Cholesterol 05/24/2022 NOT CALCULATED  0 - 99 mg/dL Final   Performed at [facility], [address] [place], [place] [address]   TSH 05/24/2022 2.834  0.350 - 4.500 uIU/mL Final   Comment: Performed by a 3rd Generation assay with a functional sensitivity of <=0.01 uIU/mL. Performed at [facility], [address] [place], [place] [address]   Admission on 05/10/2022, Discharged on 05/11/2022  Component Date Value Ref Range Status   SARS Coronavirus 2 by RT PCR 05/10/2022 NEGATIVE  NEGATIVE Final   Comment: (NOTE) SARS-CoV-2 target nucleic acids are NOT DETECTED.  The SARS-CoV-2 RNA is generally detectable in upper respiratory specimens during the acute phase of infection. The lowest concentration of SARS-CoV-2 viral copies this assay can detect is 138 copies/mL. A negative result does not preclude SARS-Cov-2 infection and should not be used as the sole basis for treatment or other patient management decisions. A negative result may occur with  improper specimen collection/handling, submission of specimen other than nasopharyngeal swab, presence of viral mutation(s) within the areas targeted by this assay, and inadequate number of viral copies(<138 copies/mL). A negative result must be combined with clinical observations, patient history, and epidemiological information. The expected result is Negative.  Fact Sheet for Patients:  BloggerCourse.com  Fact Sheet for Healthcare Providers:  SeriousBroker.it  This test is no                          t yet approved or cleared by the [place] FDA and  has been authorized for detection and/or diagnosis of SARS-CoV-2 by FDA under an Emergency Use Authorization (EUA). This EUA will remain  in effect (meaning this test can be used) for the duration of the COVID-19 declaration under Section 564(b)(1) of the Act, 21 U.S.C.section 360bbb-3(b)(1), unless the authorization is terminated  or revoked sooner.       Influenza A by PCR 05/10/2022 NEGATIVE   NEGATIVE Final  Influenza B by PCR 05/10/2022 NEGATIVE  NEGATIVE Final   Comment: (NOTE) The Xpert Xpress SARS-CoV-2/FLU/RSV plus assay is intended as an aid in the diagnosis of influenza from Nasopharyngeal swab specimens and should not be used as a sole basis for treatment. Nasal washings and aspirates are unacceptable for Xpert Xpress SARS-CoV-2/FLU/RSV testing.  Fact Sheet for Patients: BloggerCourse.com  Fact Sheet for Healthcare Providers: SeriousBroker.it  This test is not yet approved or cleared by the [place] FDA and has been authorized for detection and/or diagnosis of SARS-CoV-2 by FDA under an Emergency Use Authorization (EUA). This EUA will remain in effect (meaning this test can be used) for the duration of the COVID-19 declaration under Section 564(b)(1) of the Act, 21 U.S.C. section 360bbb-3(b)(1), unless the authorization is terminated or revoked.  Performed at [facility], [address] [place], [place] [address]    Sodium 05/10/2022 137  135 - 145 mmol/L Final   Potassium 05/10/2022 3.7  3.5 - 5.1 mmol/L Final   Chloride 05/10/2022 106  98 - 111 mmol/L Final   CO2 05/10/2022 23  22 - 32 mmol/L Final   Glucose, Bld 05/10/2022 90  70 - 99 mg/dL Final   Glucose reference range applies only to samples taken after fasting for at least 8 hours.   BUN 05/10/2022 13  8 - 23 mg/dL Final   Creatinine, Ser 05/10/2022 1.04  0.61 - 1.24 mg/dL Final   Calcium 19/14/7829 8.8 (L)  8.9 - 10.3 mg/dL Final   Total Protein 56/21/3086 7.8  6.5 - 8.1 g/dL Final   Albumin 57/84/6962 3.6  3.5 - 5.0 g/dL Final   AST 95/28/4132 30  15 - 41 U/L Final   ALT 05/10/2022 21  0 - 44 U/L Final   Alkaline Phosphatase 05/10/2022 62  38 - 126 U/L Final   Total Bilirubin 05/10/2022 0.8  0.3 - 1.2 mg/dL Final   GFR, Estimated 05/10/2022 >60  >60 mL/min Final   Comment: (NOTE) Calculated using the CKD-EPI Creatinine  Equation (2021)    Anion gap 05/10/2022 8  5 - 15 Final   Performed at [facility], [address] [place], [place] [address]   Alcohol, Ethyl (B) 05/10/2022 156 (H)  <10 mg/dL Final   Comment: (NOTE) Lowest detectable limit for serum alcohol is 10 mg/dL.  For medical purposes only. Performed at [facility], [address], [place], [place] [address]    Opiates 05/10/2022 POSITIVE (A)  NONE DETECTED Final   Cocaine 05/10/2022 POSITIVE (A)  NONE DETECTED Final   Benzodiazepines 05/10/2022 POSITIVE (A)  NONE DETECTED Final   Amphetamines 05/10/2022 NONE DETECTED  NONE DETECTED Final   Tetrahydrocannabinol 05/10/2022 NONE DETECTED  NONE DETECTED Final   Barbiturates 05/10/2022 NONE DETECTED  NONE DETECTED Final   Comment: (NOTE) DRUG SCREEN FOR MEDICAL PURPOSES ONLY.  IF CONFIRMATION IS NEEDED FOR ANY PURPOSE, NOTIFY LAB WITHIN 5 DAYS.  LOWEST DETECTABLE LIMITS FOR URINE DRUG SCREEN Drug Class                     Cutoff (ng/mL) Amphetamine and metabolites    1000 Barbiturate and metabolites    200 Benzodiazepine                 200 Tricyclics and metabolites     300 Opiates and metabolites        300 Cocaine and metabolites        300 THC  50 Performed at [facility], [address] [place], [place] [address]    WBC 05/10/2022 4.5  4.0 - 10.5 K/uL Final   RBC 05/10/2022 3.77 (L)  4.22 - 5.81 MIL/uL Final   Hemoglobin 05/10/2022 8.3 (L)  13.0 - 17.0 g/dL Final   Comment: Reticulocyte Hemoglobin testing may be clinically indicated, consider ordering this additional test [id number]    HCT 05/10/2022 27.3 (L)  39.0 - 52.0 % Final   MCV 05/10/2022 72.4 (L)  80.0 - 100.0 fL Final   MCH 05/10/2022 22.0 (L)  26.0 - 34.0 pg Final   MCHC 05/10/2022 30.4  30.0 - 36.0 g/dL Final   RDW 93/23/5573 19.8 (H)  11.5 - 15.5 % Final   Platelets 05/10/2022 162  150 - 400 K/uL Final   nRBC 05/10/2022 0.0   0.0 - 0.2 % Final   Neutrophils Relative % 05/10/2022 55  % Final   Neutro Abs 05/10/2022 2.5  1.7 - 7.7 K/uL Final   Lymphocytes Relative 05/10/2022 32  % Final   Lymphs Abs 05/10/2022 1.4  0.7 - 4.0 K/uL Final   Monocytes Relative 05/10/2022 10  % Final   Monocytes Absolute 05/10/2022 0.4  0.1 - 1.0 K/uL Final   Eosinophils Relative 05/10/2022 2  % Final   Eosinophils Absolute 05/10/2022 0.1  0.0 - 0.5 K/uL Final   Basophils Relative 05/10/2022 1  % Final   Basophils Absolute 05/10/2022 0.1  0.0 - 0.1 K/uL Final   Immature Granulocytes 05/10/2022 0  % Final   Abs Immature Granulocytes 05/10/2022 0.01  0.00 - 0.07 K/uL Final   Performed at [facility], [address], [place], [place] [address]   Salicylate Lvl 05/10/2022 <7.0 (L)  7.0 - 30.0 mg/dL Final   Performed at [facility], [address] [place], [place] [address]   Acetaminophen (Tylenol), Serum 05/10/2022 <10 (L)  10 - 30 ug/mL Final   Comment: (NOTE) Therapeutic concentrations vary significantly. A range of 10-30 ug/mL  may be an effective concentration for many patients. However, some  are best treated at concentrations outside of this range. Acetaminophen concentrations >150 ug/mL at 4 hours after ingestion  and >50 ug/mL at 12 hours after ingestion are often associated with  toxic reactions.  Performed at [facility], [address] [place], [place] [address]    Magnesium 05/10/2022 2.0  1.7 - 2.4 mg/dL Final   Performed at [facility] [address] [place], [place] [address]   Color, Urine 05/10/2022 STRAW (A)  YELLOW Final   APPearance 05/10/2022 CLEAR  CLEAR Final   Specific Gravity, Urine 05/10/2022 1.005  1.005 - 1.030 Final   pH 05/10/2022 5.0  5.0 - 8.0 Final   Glucose, UA 05/10/2022 NEGATIVE  NEGATIVE mg/dL Final   Hgb urine dipstick 05/10/2022 NEGATIVE  NEGATIVE Final   Bilirubin Urine 05/10/2022 NEGATIVE  NEGATIVE Final    Ketones, ur 05/10/2022 NEGATIVE  NEGATIVE mg/dL Final   Protein, ur 76/16/0737 NEGATIVE  NEGATIVE mg/dL Final   Nitrite 10/62/6948 NEGATIVE  NEGATIVE Final   Leukocytes,Ua 05/10/2022 NEGATIVE  NEGATIVE Final   Performed at [facility], [address] [place], [place] [address]  Admission on 05/05/2022, Discharged on 05/06/2022  Component Date Value Ref Range Status   WBC 05/05/2022 7.3  4.0 - 10.5 K/uL Final   RBC 05/05/2022 3.68 (L)  4.22 - 5.81 MIL/uL Final   Hemoglobin 05/05/2022 8.2 (L)  13.0 -  17.0 g/dL Final   Comment: Reticulocyte Hemoglobin testing may be clinically indicated, consider ordering this additional test [id number]    HCT 05/05/2022 27.1 (L)  39.0 - 52.0 % Final   MCV 05/05/2022 73.6 (L)  80.0 - 100.0 fL Final   MCH 05/05/2022 22.3 (L)  26.0 - 34.0 pg Final   MCHC 05/05/2022 30.3  30.0 - 36.0 g/dL Final   RDW 54/06/8118 19.6 (H)  11.5 - 15.5 % Final   Platelets 05/05/2022 234  150 - 400 K/uL Final   nRBC 05/05/2022 0.0  0.0 - 0.2 % Final   Neutrophils Relative % 05/05/2022 44  % Final   Neutro Abs 05/05/2022 3.2  1.7 - 7.7 K/uL Final   Lymphocytes Relative 05/05/2022 38  % Final   Lymphs Abs 05/05/2022 2.8  0.7 - 4.0 K/uL Final   Monocytes Relative 05/05/2022 13  % Final   Monocytes Absolute 05/05/2022 0.9  0.1 - 1.0 K/uL Final   Eosinophils Relative 05/05/2022 4  % Final   Eosinophils Absolute 05/05/2022 0.3  0.0 - 0.5 K/uL Final   Basophils Relative 05/05/2022 1  % Final   Basophils Absolute 05/05/2022 0.1  0.0 - 0.1 K/uL Final   Immature Granulocytes 05/05/2022 0  % Final   Abs Immature Granulocytes 05/05/2022 0.02  0.00 - 0.07 K/uL Final   Performed at [facility], [address], [place], [place] [address]   Sodium 05/05/2022 138  135 - 145 mmol/L Final   Potassium 05/05/2022 3.8  3.5 - 5.1 mmol/L Final   Chloride 05/05/2022 109  98 - 111 mmol/L Final   CO2 05/05/2022 17 (L)  22 - 32 mmol/L Final   Glucose, Bld 05/05/2022 91   70 - 99 mg/dL Final   Glucose reference range applies only to samples taken after fasting for at least 8 hours.   BUN 05/05/2022 9  8 - 23 mg/dL Final   Creatinine, Ser 05/05/2022 0.87  0.61 - 1.24 mg/dL Final   Calcium 95/62/1308 8.6 (L)  8.9 - 10.3 mg/dL Final   Total Protein 65/78/4696 7.9  6.5 - 8.1 g/dL Final   Albumin 29/52/8413 3.3 (L)  3.5 - 5.0 g/dL Final   AST 24/40/1027 33  15 - 41 U/L Final   ALT 05/05/2022 23  0 - 44 U/L Final   Alkaline Phosphatase 05/05/2022 60  38 - 126 U/L Final   Total Bilirubin 05/05/2022 0.9  0.3 - 1.2 mg/dL Final   GFR, Estimated 05/05/2022 >60  >60 mL/min Final   Comment: (NOTE) Calculated using the CKD-EPI Creatinine Equation (2021)    Anion gap 05/05/2022 12  5 - 15 Final   Performed at [facility], [address] [place], [place] [address]   Alcohol, Ethyl (B) 05/05/2022 169 (H)  <10 mg/dL Final   Comment: (NOTE) Lowest detectable limit for serum alcohol is 10 mg/dL.  For medical purposes only. Performed at [facility], [address] [place], [place] [address]    Salicylate Lvl 05/05/2022 <7.0 (L)  7.0 - 30.0 mg/dL Final   Performed at [facility], [address] [place], [place] [address]   Acetaminophen (Tylenol), Serum 05/05/2022 <10 (L)  10 - 30 ug/mL Final   Comment: (NOTE) Therapeutic concentrations vary significantly. A range of 10-30 ug/mL  may be an effective concentration for many patients. However, some  are best treated at concentrations outside of this range. Acetaminophen concentrations >150 ug/mL at 4 hours after  ingestion  and >50 ug/mL at 12 hours after ingestion are often associated with  toxic reactions.  Performed at [facility], [address] [place], [place] [address]    Opiates 05/06/2022 NONE DETECTED  NONE DETECTED Final   Cocaine 05/06/2022 NONE DETECTED  NONE DETECTED Final   Benzodiazepines 05/06/2022 POSITIVE (A)  NONE DETECTED Final   Amphetamines 05/06/2022 NONE  DETECTED  NONE DETECTED Final   Tetrahydrocannabinol 05/06/2022 NONE DETECTED  NONE DETECTED Final   Barbiturates 05/06/2022 NONE DETECTED  NONE DETECTED Final   Comment: (NOTE) DRUG SCREEN FOR MEDICAL PURPOSES ONLY.  IF CONFIRMATION IS NEEDED FOR ANY PURPOSE, NOTIFY LAB WITHIN 5 DAYS.  LOWEST DETECTABLE LIMITS FOR URINE DRUG SCREEN Drug Class                     Cutoff (ng/mL) Amphetamine and metabolites    1000 Barbiturate and metabolites    200 Benzodiazepine                 200 Tricyclics and metabolites     300 Opiates and metabolites        300 Cocaine and metabolites        300 THC                            50 Performed at [facility], [address] [place], [place] [address]    SARS Coronavirus 2 by RT PCR 05/06/2022 NEGATIVE  NEGATIVE Final   Comment: (NOTE) SARS-CoV-2 target nucleic acids are NOT DETECTED.  The SARS-CoV-2 RNA is generally detectable in upper respiratory specimens during the acute phase of infection. The lowest concentration of SARS-CoV-2 viral copies this assay can detect is 138 copies/mL. A negative result does not preclude SARS-Cov-2 infection and should not be used as the sole basis for treatment or other patient management decisions. A negative result may occur with  improper specimen collection/handling, submission of specimen other than nasopharyngeal swab, presence of viral mutation(s) within the areas targeted by this assay, and inadequate number of viral copies(<138 copies/mL). A negative result must be combined with clinical observations, patient history, and epidemiological information. The expected result is Negative.  Fact Sheet for Patients:  BloggerCourse.com  Fact Sheet for Healthcare Providers:  SeriousBroker.it  This test is no                          t yet approved or cleared by the [place] FDA and  has been authorized for detection and/or diagnosis of  SARS-CoV-2 by FDA under an Emergency Use Authorization (EUA). This EUA will remain  in effect (meaning this test can be used) for the duration of the COVID-19 declaration under Section 564(b)(1) of the Act, 21 U.S.C.section 360bbb-3(b)(1), unless the authorization is terminated  or revoked sooner.       Influenza A by PCR 05/06/2022 NEGATIVE  NEGATIVE Final   Influenza B by PCR 05/06/2022 NEGATIVE  NEGATIVE Final   Comment: (NOTE) The Xpert Xpress SARS-CoV-2/FLU/RSV plus assay is intended as an aid in the diagnosis of influenza from Nasopharyngeal swab specimens and should not be used as a sole basis for treatment. Nasal washings and aspirates are unacceptable for Xpert Xpress SARS-CoV-2/FLU/RSV testing.  Fact Sheet for Patients: BloggerCourse.com  Fact Sheet for Healthcare Providers: SeriousBroker.it  This test is not yet approved or cleared by the [place] FDA and has  been authorized for detection and/or diagnosis of SARS-CoV-2 by FDA under an Emergency Use Authorization (EUA). This EUA will remain in effect (meaning this test can be used) for the duration of the COVID-19 declaration under Section 564(b)(1) of the Act, 21 U.S.C. section 360bbb-3(b)(1), unless the authorization is terminated or revoked.  Performed at [facility], [address], [place], [place] [address]   Admission on 05/05/2022, Discharged on 05/05/2022  Component Date Value Ref Range Status   Sodium 05/05/2022 138  135 - 145 mmol/L Final   Potassium 05/05/2022 3.7  3.5 - 5.1 mmol/L Final   Chloride 05/05/2022 105  98 - 111 mmol/L Final   CO2 05/05/2022 19 (L)  22 - 32 mmol/L Final   Glucose, Bld 05/05/2022 91  70 - 99 mg/dL Final   Glucose reference range applies only to samples taken after fasting for at least 8 hours.   BUN 05/05/2022 11  8 - 23 mg/dL Final   Creatinine, Ser 05/05/2022 0.90  0.61 - 1.24 mg/dL Final   Calcium 60/45/4098  8.9  8.9 - 10.3 mg/dL Final   GFR, Estimated 05/05/2022 >60  >60 mL/min Final   Comment: (NOTE) Calculated using the CKD-EPI Creatinine Equation (2021)    Anion gap 05/05/2022 14  5 - 15 Final   Performed at [facility], [address] [place], [place] [address]   WBC 05/05/2022 8.0  4.0 - 10.5 K/uL Final   RBC 05/05/2022 3.78 (L)  4.22 - 5.81 MIL/uL Final   Hemoglobin 05/05/2022 8.4 (L)  13.0 - 17.0 g/dL Final   Comment: Reticulocyte Hemoglobin testing may be clinically indicated, consider ordering this additional test [id number]    HCT 05/05/2022 27.4 (L)  39.0 - 52.0 % Final   MCV 05/05/2022 72.5 (L)  80.0 - 100.0 fL Final   MCH 05/05/2022 22.2 (L)  26.0 - 34.0 pg Final   MCHC 05/05/2022 30.7  30.0 - 36.0 g/dL Final   RDW 30/86/5784 19.8 (H)  11.5 - 15.5 % Final   Platelets 05/05/2022 222  150 - 400 K/uL Final   nRBC 05/05/2022 0.0  0.0 - 0.2 % Final   Performed at [facility], [address] [place], [place] [address]   Troponin I (High Sensitivity) 05/05/2022 10  <18 ng/L Final   Comment: (NOTE) Elevated high sensitivity troponin I (hsTnI) values and significant  changes across serial measurements may suggest ACS but many other  chronic and acute conditions are known to elevate hsTnI results.  Refer to the "Links" section for chest pain algorithms and additional  guidance. Performed at [facility], [address] [place], [place] [address]    Troponin I (High Sensitivity) 05/05/2022 7  <18 ng/L Final   Comment: (NOTE) Elevated high sensitivity troponin I (hsTnI) values and significant  changes across serial measurements may suggest ACS but many other  chronic and acute conditions are known to elevate hsTnI results.  Refer to the "Links" section for chest pain algorithms and additional  guidance. Performed at [facility], [address] [place], [place] [address]   Admission on 04/20/2022, Discharged on 05/03/2022  Component Date Value Ref  Range Status   Sodium 04/20/2022 135  135 - 145 mmol/L Final   Potassium 04/20/2022 4.1  3.5 - 5.1 mmol/L Final   Chloride 04/20/2022 100  98 - 111 mmol/L Final   CO2 04/20/2022 19 (L)  22 - 32 mmol/L Final   Glucose, Bld  04/20/2022 90  70 - 99 mg/dL Final   Glucose reference range applies only to samples taken after fasting for at least 8 hours.   BUN 04/20/2022 10  8 - 23 mg/dL Final   Creatinine, Ser 04/20/2022 1.06  0.61 - 1.24 mg/dL Final   Calcium 16/07/9603 8.8 (L)  8.9 - 10.3 mg/dL Final   Total Protein 54/06/8118 7.3  6.5 - 8.1 g/dL Final   Albumin 14/78/2956 3.4 (L)  3.5 - 5.0 g/dL Final   AST 21/30/8657 74 (H)  15 - 41 U/L Final   ALT 04/20/2022 41  0 - 44 U/L Final   Alkaline Phosphatase 04/20/2022 69  38 - 126 U/L Final   Total Bilirubin 04/20/2022 1.4 (H)  0.3 - 1.2 mg/dL Final   GFR, Estimated 04/20/2022 >60  >60 mL/min Final   Comment: (NOTE) Calculated using the CKD-EPI Creatinine Equation (2021)    Anion gap 04/20/2022 16 (H)  5 - 15 Final   Performed at [facility], [address] [place], [place] [address]   Sodium 04/20/2022 135  135 - 145 mmol/L Final   Potassium 04/20/2022 4.2  3.5 - 5.1 mmol/L Final   Chloride 04/20/2022 103  98 - 111 mmol/L Final   BUN 04/20/2022 10  8 - 23 mg/dL Final   Creatinine, Ser 04/20/2022 1.30 (H)  0.61 - 1.24 mg/dL Final   Glucose, Bld 29/52/8413 93  70 - 99 mg/dL Final   Glucose reference range applies only to samples taken after fasting for at least 8 hours.   Calcium, Ion 04/20/2022 0.88 (LL)  1.15 - 1.40 mmol/L Final   TCO2 04/20/2022 21 (L)  22 - 32 mmol/L Final   Hemoglobin 04/20/2022 12.9 (L)  13.0 - 17.0 g/dL Final   HCT 24/40/1027 38.0 (L)  39.0 - 52.0 % Final   Comment 04/20/2022 NOTIFIED PHYSICIAN   Final   WBC 04/20/2022 6.9  4.0 - 10.5 K/uL Final   RBC 04/20/2022 4.91  4.22 - 5.81 MIL/uL Final   Hemoglobin 04/20/2022 11.1 (L)  13.0 - 17.0 g/dL Final   HCT 25/36/6440 38.0 (L)  39.0 - 52.0 % Final   MCV  04/20/2022 77.4 (L)  80.0 - 100.0 fL Final   MCH 04/20/2022 22.6 (L)  26.0 - 34.0 pg Final   MCHC 04/20/2022 29.2 (L)  30.0 - 36.0 g/dL Final   RDW 34/74/2595 21.0 (H)  11.5 - 15.5 % Final   Platelets 04/20/2022 139 (L)  150 - 400 K/uL Final   nRBC 04/20/2022 0.0  0.0 - 0.2 % Final   Performed at [facility], [address], [place], [place] [address]   Alcohol, Ethyl (B) 04/20/2022 262 (H)  <10 mg/dL Final   Comment: (NOTE) Lowest detectable limit for serum alcohol is 10 mg/dL.  For medical purposes only. Performed at [facility], [address] [place], [place] [address]    Color, Urine 04/20/2022 YELLOW  YELLOW Final   APPearance 04/20/2022 CLEAR  CLEAR Final   Specific Gravity, Urine 04/20/2022 1.008  1.005 - 1.030 Final   pH 04/20/2022 7.0  5.0 - 8.0 Final   Glucose, UA 04/20/2022 NEGATIVE  NEGATIVE mg/dL Final   Hgb urine dipstick 04/20/2022 NEGATIVE  NEGATIVE Final   Bilirubin Urine 04/20/2022 NEGATIVE  NEGATIVE Final   Ketones, ur 04/20/2022 NEGATIVE  NEGATIVE mg/dL Final   Protein, ur 95/18/8416 NEGATIVE  NEGATIVE mg/dL Final   Nitrite 60/63/0160 NEGATIVE  NEGATIVE Final  Leukocytes,Ua 04/20/2022 NEGATIVE  NEGATIVE Final   Performed at [facility], [address], [place], [place] [address]   Lactic Acid, Venous 04/20/2022 3.2 (HH)  0.5 - 1.9 mmol/L Final   Comment: CRITICAL RESULT CALLED TO, READ BACK BY AND VERIFIED WITH: M.RUGGIERO,RN @1959  04/20/2022 [email or web address] Performed at [facility], [address], [place], [place] [address]    Prothrombin Time 04/20/2022 14.1  11.4 - 15.2 seconds Final   INR 04/20/2022 1.1  0.8 - 1.2 Final   Comment: (NOTE) INR goal varies based on device and disease states. Performed at [facility], [address], [place], [place] [address]    Blood Bank Specimen 04/20/2022 SAMPLE AVAILABLE FOR TESTING   Final   Sample Expiration 04/20/2022    Final                   Value:04/21/2022,2359 Performed at  [facility], [address] [place], [place] [address]    Sodium 04/21/2022 141  135 - 145 mmol/L Final   Potassium 04/21/2022 4.1  3.5 - 5.1 mmol/L Final   Chloride 04/21/2022 102  98 - 111 mmol/L Final   CO2 04/21/2022 25  22 - 32 mmol/L Final   Glucose, Bld 04/21/2022 104 (H)  70 - 99 mg/dL Final   Glucose reference range applies only to samples taken after fasting for at least 8 hours.   BUN 04/21/2022 12  8 - 23 mg/dL Final   Creatinine, Ser 04/21/2022 0.97  0.61 - 1.24 mg/dL Final   Calcium 96/29/5284 9.1  8.9 - 10.3 mg/dL Final   GFR, Estimated 04/21/2022 >60  >60 mL/min Final   Comment: (NOTE) Calculated using the CKD-EPI Creatinine Equation (2021)    Anion gap 04/21/2022 14  5 - 15 Final   Performed at [facility], [address], [place], [place] [address]   WBC 04/21/2022 5.8  4.0 - 10.5 K/uL Final   RBC 04/21/2022 3.94 (L)  4.22 - 5.81 MIL/uL Final   Hemoglobin 04/21/2022 9.1 (L)  13.0 - 17.0 g/dL Final   HCT 10/19/7251 29.3 (L)  39.0 - 52.0 % Final   MCV 04/21/2022 74.4 (L)  80.0 - 100.0 fL Final   MCH 04/21/2022 23.1 (L)  26.0 - 34.0 pg Final   MCHC 04/21/2022 31.1  30.0 - 36.0 g/dL Final   RDW 66/44/0347 20.3 (H)  11.5 - 15.5 % Final   Platelets 04/21/2022 99 (L)  150 - 400 K/uL Final   Comment: Immature Platelet Fraction may be clinically indicated, consider ordering this additional test [id number] REPEATED TO VERIFY PLATELET COUNT CONFIRMED BY SMEAR    nRBC 04/21/2022 0.0  0.0 - 0.2 % Final   Performed at [facility], [address] [place], [place] [address]   HIV Screen 4th Generation wRfx 04/21/2022 Non Reactive  Non Reactive Final   Performed at [facility], [address], [place], [place] [address]   Sodium 04/22/2022 138  135 - 145 mmol/L Final   Potassium 04/22/2022 3.7  3.5 - 5.1 mmol/L Final   Chloride 04/22/2022 103  98 - 111 mmol/L Final   CO2 04/22/2022 26  22 - 32 mmol/L Final   Glucose, Bld 04/22/2022 124 (H)  70 -  99 mg/dL Final   Glucose reference range applies only to samples taken after fasting for at least 8 hours.   BUN 04/22/2022 9  8 - 23 mg/dL Final  Creatinine, Ser 04/22/2022 0.90  0.61 - 1.24 mg/dL Final   Calcium 16/07/9603 8.6 (L)  8.9 - 10.3 mg/dL Final   GFR, Estimated 04/22/2022 >60  >60 mL/min Final   Comment: (NOTE) Calculated using the CKD-EPI Creatinine Equation (2021)    Anion gap 04/22/2022 9  5 - 15 Final   Performed at [facility], [address], [place], [place] [address]   WBC 04/22/2022 3.6 (L)  4.0 - 10.5 K/uL Final   RBC 04/22/2022 3.55 (L)  4.22 - 5.81 MIL/uL Final   Hemoglobin 04/22/2022 8.2 (L)  13.0 - 17.0 g/dL Final   Comment: Reticulocyte Hemoglobin testing may be clinically indicated, consider ordering this additional test [id number]    HCT 04/22/2022 26.6 (L)  39.0 - 52.0 % Final   MCV 04/22/2022 74.9 (L)  80.0 - 100.0 fL Final   MCH 04/22/2022 23.1 (L)  26.0 - 34.0 pg Final   MCHC 04/22/2022 30.8  30.0 - 36.0 g/dL Final   RDW 95/62/1308 20.5 (H)  11.5 - 15.5 % Final   Platelets 04/22/2022 73 (L)  150 - 400 K/uL Final   Comment: Immature Platelet Fraction may be clinically indicated, consider ordering this additional test [id number] REPEATED TO VERIFY    nRBC 04/22/2022 0.0  0.0 - 0.2 % Final   Performed at [facility], [address] [place], [place] [address]   WBC 04/23/2022 3.7 (L)  4.0 - 10.5 K/uL Final   RBC 04/23/2022 3.61 (L)  4.22 - 5.81 MIL/uL Final   Hemoglobin 04/23/2022 8.4 (L)  13.0 - 17.0 g/dL Final   Comment: Reticulocyte Hemoglobin testing may be clinically indicated, consider ordering this additional test [id number]    HCT 04/23/2022 27.0 (L)  39.0 - 52.0 % Final   MCV 04/23/2022 74.8 (L)  80.0 - 100.0 fL Final   MCH 04/23/2022 23.3 (L)  26.0 - 34.0 pg Final   MCHC 04/23/2022 31.1  30.0 - 36.0 g/dL Final   RDW 02/72/5366 20.9 (H)  11.5 - 15.5 % Final   Platelets 04/23/2022 73 (L)  150 - 400 K/uL Final   Comment:  Immature Platelet Fraction may be clinically indicated, consider ordering this additional test [id number] CONSISTENT WITH PREVIOUS RESULT REPEATED TO VERIFY    nRBC 04/23/2022 0.0  0.0 - 0.2 % Final   Performed at [facility], [address] [place], [place] [address]   WBC 04/26/2022 3.8 (L)  4.0 - 10.5 K/uL Final   RBC 04/26/2022 3.34 (L)  4.22 - 5.81 MIL/uL Final   Hemoglobin 04/26/2022 7.7 (L)  13.0 - 17.0 g/dL Final   Comment: Reticulocyte Hemoglobin testing may be clinically indicated, consider ordering this additional test [id number]    HCT 04/26/2022 25.1 (L)  39.0 - 52.0 % Final   MCV 04/26/2022 75.1 (L)  80.0 - 100.0 fL Final   MCH 04/26/2022 23.1 (L)  26.0 - 34.0 pg Final   MCHC 04/26/2022 30.7  30.0 - 36.0 g/dL Final   RDW 95/18/8416 20.3 (H)  11.5 - 15.5 % Final   Platelets 04/26/2022 98 (L)  150 - 400 K/uL Final   REPEATED TO VERIFY   nRBC 04/26/2022 0.0  0.0 - 0.2 % Final   Performed at [facility], [address], [place], [place] [address]   Sodium 04/26/2022 140  135 - 145 mmol/L Final   Potassium 04/26/2022 4.5  3.5 - 5.1 mmol/L Final   Chloride 04/26/2022 105  98 -  111 mmol/L Final   CO2 04/26/2022 28  22 - 32 mmol/L Final   Glucose, Bld 04/26/2022 101 (H)  70 - 99 mg/dL Final   Glucose reference range applies only to samples taken after fasting for at least 8 hours.   BUN 04/26/2022 12  8 - 23 mg/dL Final   Creatinine, Ser 04/26/2022 1.04  0.61 - 1.24 mg/dL Final   Calcium 78/29/5621 8.8 (L)  8.9 - 10.3 mg/dL Final   GFR, Estimated 04/26/2022 >60  >60 mL/min Final   Comment: (NOTE) Calculated using the CKD-EPI Creatinine Equation (2021)    Anion gap 04/26/2022 7  5 - 15 Final   Performed at [facility], [address] [place], [place] [address]   WBC 04/27/2022 3.8 (L)  4.0 - 10.5 K/uL Final   RBC 04/27/2022 3.59 (L)  4.22 - 5.81 MIL/uL Final   Hemoglobin 04/27/2022 8.2 (L)  13.0 - 17.0 g/dL Final   Comment: Reticulocyte Hemoglobin  testing may be clinically indicated, consider ordering this additional test [id number]    HCT 04/27/2022 27.1 (L)  39.0 - 52.0 % Final   MCV 04/27/2022 75.5 (L)  80.0 - 100.0 fL Final   MCH 04/27/2022 22.8 (L)  26.0 - 34.0 pg Final   MCHC 04/27/2022 30.3  30.0 - 36.0 g/dL Final   RDW 52/84/1324 20.1 (H)  11.5 - 15.5 % Final   Platelets 04/27/2022 114 (L)  150 - 400 K/uL Final   REPEATED TO VERIFY   nRBC 04/27/2022 0.0  0.0 - 0.2 % Final   Performed at [facility], [address] [place], [place] [address]   Sodium 04/27/2022 139  135 - 145 mmol/L Final   Potassium 04/27/2022 4.2  3.5 - 5.1 mmol/L Final   Chloride 04/27/2022 108  98 - 111 mmol/L Final   CO2 04/27/2022 25  22 - 32 mmol/L Final   Glucose, Bld 04/27/2022 117 (H)  70 - 99 mg/dL Final   Glucose reference range applies only to samples taken after fasting for at least 8 hours.   BUN 04/27/2022 9  8 - 23 mg/dL Final   Creatinine, Ser 04/27/2022 1.16  0.61 - 1.24 mg/dL Final   Calcium 72/53/6644 8.7 (L)  8.9 - 10.3 mg/dL Final   GFR, Estimated 04/27/2022 >60  >60 mL/min Final   Comment: (NOTE) Calculated using the CKD-EPI Creatinine Equation (2021)    Anion gap 04/27/2022 6  5 - 15 Final   Performed at [facility], [address] [place], [place] [address]   WBC 04/28/2022 4.5  4.0 - 10.5 K/uL Final   RBC 04/28/2022 3.39 (L)  4.22 - 5.81 MIL/uL Final   Hemoglobin 04/28/2022 7.7 (L)  13.0 - 17.0 g/dL Final   Comment: Reticulocyte Hemoglobin testing may be clinically indicated, consider ordering this additional test [id number]    HCT 04/28/2022 25.4 (L)  39.0 - 52.0 % Final   MCV 04/28/2022 74.9 (L)  80.0 - 100.0 fL Final   MCH 04/28/2022 22.7 (L)  26.0 - 34.0 pg Final   MCHC 04/28/2022 30.3  30.0 - 36.0 g/dL Final   RDW 56/43/3295 19.9 (H)  11.5 - 15.5 % Final   Platelets 04/28/2022 125 (L)  150 - 400 K/uL Final   REPEATED TO VERIFY   nRBC 04/28/2022 0.0  0.0 - 0.2 % Final   Performed at [facility], [address] [place], [place] [address]   WBC 04/29/2022 4.3  4.0 - 10.5 K/uL Final   RBC 04/29/2022 3.39 (L)  4.22 - 5.81 MIL/uL Final   Hemoglobin 04/29/2022 7.8 (L)  13.0 - 17.0 g/dL Final   Comment: Reticulocyte Hemoglobin testing may be clinically indicated, consider ordering this additional test [id number]    HCT 04/29/2022 25.1 (L)  39.0 - 52.0 % Final   MCV 04/29/2022 74.0 (L)  80.0 - 100.0 fL Final   MCH 04/29/2022 23.0 (L)  26.0 - 34.0 pg Final   MCHC 04/29/2022 31.1  30.0 - 36.0 g/dL Final   RDW 47/82/9562 19.9 (H)  11.5 - 15.5 % Final   Platelets 04/29/2022 125 (L)  150 - 400 K/uL Final   REPEATED TO VERIFY   nRBC 04/29/2022 0.0  0.0 - 0.2 % Final   Performed at [facility], [address], [place], [place] [address]   WBC 05/01/2022 3.6 (L)  4.0 - 10.5 K/uL Final   RBC 05/01/2022 3.13 (L)  4.22 - 5.81 MIL/uL Final   Hemoglobin 05/01/2022 7.1 (L)  13.0 - 17.0 g/dL Final   Comment: Reticulocyte Hemoglobin testing may be clinically indicated, consider ordering this additional test [id number]    HCT 05/01/2022 23.0 (L)  39.0 - 52.0 % Final   MCV 05/01/2022 73.5 (L)  80.0 - 100.0 fL Final   MCH 05/01/2022 22.7 (L)  26.0 - 34.0 pg Final   MCHC 05/01/2022 30.9  30.0 - 36.0 g/dL Final   RDW 95/28/4132 19.8 (H)  11.5 - 15.5 % Final   Platelets 05/01/2022 149 (L)  150 - 400 K/uL Final   REPEATED TO VERIFY   nRBC 05/01/2022 0.0  0.0 - 0.2 % Final   Performed at [facility], [address] [place], [place] [address]   Sodium 05/01/2022 137  135 - 145 mmol/L Final   Potassium 05/01/2022 4.0  3.5 - 5.1 mmol/L Final   Chloride 05/01/2022 109  98 - 111 mmol/L Final   CO2 05/01/2022 23  22 - 32 mmol/L Final   Glucose, Bld 05/01/2022 98  70 - 99 mg/dL Final   Glucose reference range applies only to samples taken after fasting for at least 8 hours.   BUN 05/01/2022 14  8 - 23 mg/dL Final   Creatinine, Ser 05/01/2022 0.83  0.61 - 1.24 mg/dL Final   Calcium  27/25/3664 8.4 (L)  8.9 - 10.3 mg/dL Final   GFR, Estimated 05/01/2022 >60  >60 mL/min Final   Comment: (NOTE) Calculated using the CKD-EPI Creatinine Equation (2021)    Anion gap 05/01/2022 5  5 - 15 Final   Performed at [facility], [address] [place], [place] [address]   WBC 05/02/2022 7.8  4.0 - 10.5 K/uL Final   RBC 05/02/2022 3.23 (L)  4.22 - 5.81 MIL/uL Final   Hemoglobin 05/02/2022 7.2 (L)  13.0 - 17.0 g/dL Final   Comment: Reticulocyte Hemoglobin testing may be clinically indicated, consider ordering this additional test [id number]    HCT 05/02/2022 23.6 (L)  39.0 - 52.0 % Final   MCV 05/02/2022 73.1 (L)  80.0 - 100.0 fL Final   MCH 05/02/2022 22.3 (L)  26.0 - 34.0 pg Final   MCHC 05/02/2022 30.5  30.0 - 36.0 g/dL Final   RDW 75/64/3329 19.5 (H)  11.5 - 15.5 % Final   Platelets 05/02/2022 153  150 - 400 K/uL Final   REPEATED TO VERIFY   nRBC 05/02/2022 0.0  0.0 - 0.2 % Final  Performed at [facility], [address] [place], [place] [address]  Admission on 03/31/2022, Discharged on 04/02/2022  Component Date Value Ref Range Status   Sodium 03/31/2022 140  135 - 145 mmol/L Final   Potassium 03/31/2022 3.6  3.5 - 5.1 mmol/L Final   Chloride 03/31/2022 108  98 - 111 mmol/L Final   CO2 03/31/2022 19 (L)  22 - 32 mmol/L Final   Glucose, Bld 03/31/2022 84  70 - 99 mg/dL Final   Glucose reference range applies only to samples taken after fasting for at least 8 hours.   BUN 03/31/2022 9  8 - 23 mg/dL Final   Creatinine, Ser 03/31/2022 0.86  0.61 - 1.24 mg/dL Final   Calcium 60/45/4098 8.6 (L)  8.9 - 10.3 mg/dL Final   Total Protein 11/91/4782 7.2  6.5 - 8.1 g/dL Final   Albumin 95/62/1308 3.5  3.5 - 5.0 g/dL Final   AST 65/78/4696 56 (H)  15 - 41 U/L Final   ALT 03/31/2022 35  0 - 44 U/L Final   Alkaline Phosphatase 03/31/2022 61  38 - 126 U/L Final   Total Bilirubin 03/31/2022 2.0 (H)  0.3 - 1.2 mg/dL Final   GFR, Estimated 03/31/2022 >60  >60 mL/min  Final   Comment: (NOTE) Calculated using the CKD-EPI Creatinine Equation (2021)    Anion gap 03/31/2022 13  5 - 15 Final   Performed at [facility], [address], [place], [place] [address]   Alcohol, Ethyl (B) 03/31/2022 203 (H)  <10 mg/dL Final   Comment: (NOTE) Lowest detectable limit for serum alcohol is 10 mg/dL.  For medical purposes only. Performed at [facility], [address] [place], [place] [address]    Salicylate Lvl 03/31/2022 <7.0 (L)  7.0 - 30.0 mg/dL Final   Performed at [facility], [address], [place], [place] [address]   Acetaminophen (Tylenol), Serum 03/31/2022 <10 (L)  10 - 30 ug/mL Final   Comment: (NOTE) Therapeutic concentrations vary significantly. A range of 10-30 ug/mL  may be an effective concentration for many patients. However, some  are best treated at concentrations outside of this range. Acetaminophen concentrations >150 ug/mL at 4 hours after ingestion  and >50 ug/mL at 12 hours after ingestion are often associated with  toxic reactions.  Performed at [facility], [address] [place], [place] [address]    WBC 03/31/2022 4.4  4.0 - 10.5 K/uL Final   RBC 03/31/2022 5.10  4.22 - 5.81 MIL/uL Final   Hemoglobin 03/31/2022 11.8 (L)  13.0 - 17.0 g/dL Final   HCT 64/40/3474 38.5 (L)  39.0 - 52.0 % Final   MCV 03/31/2022 75.5 (L)  80.0 - 100.0 fL Final   MCH 03/31/2022 23.1 (L)  26.0 - 34.0 pg Final   MCHC 03/31/2022 30.6  30.0 - 36.0 g/dL Final   RDW 25/95/6387 19.4 (H)  11.5 - 15.5 % Final   Platelets 03/31/2022 102 (L)  150 - 400 K/uL Final   Comment: Immature Platelet Fraction may be clinically indicated, consider ordering this additional test [id number] REPEATED TO VERIFY PLATELET COUNT CONFIRMED BY SMEAR    nRBC 03/31/2022 0.0  0.0 - 0.2 % Final   Performed at [facility], [address] [place], [place] [address]   Opiates 03/31/2022 NONE DETECTED  NONE DETECTED Final   Cocaine 03/31/2022 NONE  DETECTED  NONE DETECTED Final   Benzodiazepines 03/31/2022 NONE DETECTED  NONE DETECTED Final   Amphetamines 03/31/2022 NONE DETECTED  NONE DETECTED Final   Tetrahydrocannabinol 03/31/2022 NONE DETECTED  NONE DETECTED Final   Barbiturates 03/31/2022 NONE DETECTED  NONE DETECTED Final   Comment: (NOTE) DRUG SCREEN FOR MEDICAL PURPOSES ONLY.  IF CONFIRMATION IS NEEDED FOR ANY PURPOSE, NOTIFY LAB WITHIN 5 DAYS.  LOWEST DETECTABLE LIMITS FOR URINE DRUG SCREEN Drug Class                     Cutoff (ng/mL) Amphetamine and metabolites    1000 Barbiturate and metabolites    200 Benzodiazepine                 200 Tricyclics and metabolites     300 Opiates and metabolites        300 Cocaine and metabolites        300 THC                            50 Performed at [facility] Lab, [address] [place], [place] [address]    SARS Coronavirus 2 by RT PCR 03/31/2022 NEGATIVE  NEGATIVE Final   Comment: (NOTE) SARS-CoV-2 target nucleic acids are NOT DETECTED.  The SARS-CoV-2 RNA is generally detectable in upper and lower respiratory specimens during the acute phase of infection. The lowest concentration of SARS-CoV-2 viral copies this assay can detect is 250 copies / mL. A negative result does not preclude SARS-CoV-2 infection and should not be used as the sole basis for treatment or other patient management decisions.  A negative result may occur with improper specimen collection / handling, submission of specimen other than nasopharyngeal swab, presence of viral mutation(s) within the areas targeted by this assay, and inadequate number of viral copies (<250 copies / mL). A negative result must be combined with clinical observations, patient history, and epidemiological information.  Fact Sheet for Patients:   RoadLapTop.co.za  Fact Sheet for Healthcare Providers: http://kim-miller.com/  This test is not yet approved or                            cleared by the [place] FDA and has been authorized for detection and/or diagnosis of SARS-CoV-2 by FDA under an Emergency Use Authorization (EUA).  This EUA will remain in effect (meaning this test can be used) for the duration of the COVID-19 declaration under Section 564(b)(1) of the Act, 21 U.S.C. section 360bbb-3(b)(1), unless the authorization is terminated or revoked sooner.  Performed at [facility], [address] [place], [place] [address]   Admission on 01/22/2022, Discharged on 01/22/2022  Component Date Value Ref Range Status   Lipase 01/22/2022 44  11 - 51 U/L Final   Performed at [facility], [address], [place], [place] [address]   Sodium 01/22/2022 138  135 - 145 mmol/L Final   Potassium 01/22/2022 3.4 (L)  3.5 - 5.1 mmol/L Final   Chloride 01/22/2022 105  98 - 111 mmol/L Final   CO2 01/22/2022 24  22 - 32 mmol/L Final   Glucose, Bld 01/22/2022 90  70 - 99 mg/dL Final   Glucose reference range applies only to samples taken after fasting for at least 8 hours.   BUN 01/22/2022 10  8 - 23 mg/dL Final   Creatinine, Ser 01/22/2022 0.87  0.61 - 1.24 mg/dL Final   Calcium 91/47/8295 8.9  8.9 - 10.3 mg/dL Final   Total Protein 16/07/9603 7.9  6.5 - 8.1 g/dL Final   Albumin 54/06/8118 3.7  3.5 - 5.0 g/dL Final   AST 14/78/2956 43 (H)  15 - 41 U/L Final   ALT 01/22/2022 31  0 - 44 U/L Final   Alkaline Phosphatase 01/22/2022 74  38 - 126 U/L Final   Total Bilirubin 01/22/2022 2.0 (H)  0.3 - 1.2 mg/dL Final   GFR, Estimated 01/22/2022 >60  >60 mL/min Final   Comment: (NOTE) Calculated using the CKD-EPI Creatinine Equation (2021)    Anion gap 01/22/2022 9  5 - 15 Final   Performed at [facility], [address] [place], [place] [address]   WBC 01/22/2022 4.7  4.0 - 10.5 K/uL Final   RBC 01/22/2022 5.84 (H)  4.22 - 5.81 MIL/uL Final   Hemoglobin 01/22/2022 13.1  13.0 - 17.0 g/dL Final   HCT 65/78/4696 43.9   39.0 - 52.0 % Final   MCV 01/22/2022 75.2 (L)  80.0 - 100.0 fL Final   MCH 01/22/2022 22.4 (L)  26.0 - 34.0 pg Final   MCHC 01/22/2022 29.8 (L)  30.0 - 36.0 g/dL Final   RDW 29/52/8413 19.7 (H)  11.5 - 15.5 % Final   Platelets 01/22/2022 128 (L)  150 - 400 K/uL Final   nRBC 01/22/2022 0.0  0.0 - 0.2 % Final   Performed at [facility], [address] [place], [place] [address]   Color, Urine 01/22/2022 STRAW (A)  YELLOW Final   APPearance 01/22/2022 CLEAR  CLEAR Final   Specific Gravity, Urine 01/22/2022 1.002 (L)  1.005 - 1.030 Final   pH 01/22/2022 5.0  5.0 - 8.0 Final   Glucose, UA 01/22/2022 NEGATIVE  NEGATIVE mg/dL Final   Hgb urine dipstick 01/22/2022 NEGATIVE  NEGATIVE Final   Bilirubin Urine 01/22/2022 NEGATIVE  NEGATIVE Final   Ketones, ur 01/22/2022 NEGATIVE  NEGATIVE mg/dL Final   Protein, ur 02/72/5366 NEGATIVE  NEGATIVE mg/dL Final   Nitrite 44/12/4740 NEGATIVE  NEGATIVE Final   Leukocytes,Ua 01/22/2022 NEGATIVE  NEGATIVE Final   Performed at [facility], [address], [place], [place] [address]   Opiates 01/22/2022 NONE DETECTED  NONE DETECTED Final   Cocaine 01/22/2022 NONE DETECTED  NONE DETECTED Final   Benzodiazepines 01/22/2022 NONE DETECTED  NONE DETECTED Final   Amphetamines 01/22/2022 NONE DETECTED  NONE DETECTED Final   Tetrahydrocannabinol 01/22/2022 NONE DETECTED  NONE DETECTED Final   Barbiturates 01/22/2022 NONE DETECTED  NONE DETECTED Final   Comment: (NOTE) DRUG SCREEN FOR MEDICAL PURPOSES ONLY.  IF CONFIRMATION IS NEEDED FOR ANY PURPOSE, NOTIFY LAB WITHIN 5 DAYS.  LOWEST DETECTABLE LIMITS FOR URINE DRUG SCREEN Drug Class                     Cutoff (ng/mL) Amphetamine and metabolites    1000 Barbiturate and metabolites    200 Benzodiazepine                 200 Tricyclics and metabolites     300 Opiates and metabolites        300 Cocaine and metabolites        300 THC                             50 Performed at [facility], [address] [place], [place] [address]  Alcohol, Ethyl (B) 01/22/2022 134 (H)  <10 mg/dL Final   Comment: (NOTE) Lowest detectable limit for serum alcohol is 10 mg/dL.  For medical purposes only. Performed at [facility], [address], [place], [place] [address]    Specimen Description 01/22/2022    Final                   Value:URINE, CLEAN CATCH Performed at [facility], [address] [place], [place] [address]    Special Requests 01/22/2022    Final                   Value:NONE Performed at [facility], [address] [place], [place] [address]    Culture 01/22/2022    Final                   Value:NO GROWTH Performed at [facility], [address] [place], [place] [address]    Report Status 01/22/2022 01/23/2022 FINAL   Final   Ammonia 01/22/2022 18  9 - 35 umol/L Final   Performed at [facility], [address], [place], [place] [address]   Acetaminophen (Tylenol), Serum 01/22/2022 <10 (L)  10 - 30 ug/mL Final   Comment: (NOTE) Therapeutic concentrations vary significantly. A range of 10-30 ug/mL  may be an effective concentration for many patients. However, some  are best treated at concentrations outside of this range. Acetaminophen concentrations >150 ug/mL at 4 hours after ingestion  and >50 ug/mL at 12 hours after ingestion are often associated with  toxic reactions.  Performed at [facility], [address] [place], [place] [address]    Salicylate Lvl 01/22/2022 <7.0 (L)  7.0 - 30.0 mg/dL Final   Performed at [facility], [address], [place], [place] [address]  Admission on 01/10/2022, Discharged on 01/10/2022  Component Date Value Ref Range Status   Sodium 01/10/2022 131 (L)  135 - 145 mmol/L Final   Potassium 01/10/2022 4.0  3.5 - 5.1 mmol/L Final   Chloride 01/10/2022  98  98 - 111 mmol/L Final   CO2 01/10/2022 23  22 - 32 mmol/L Final   Glucose, Bld 01/10/2022 84  70 - 99 mg/dL Final   Glucose reference range applies only to samples taken after fasting for at least 8 hours.   BUN 01/10/2022 8  8 - 23 mg/dL Final   Creatinine, Ser 01/10/2022 0.76  0.61 - 1.24 mg/dL Final   Calcium 34/74/2595 9.1  8.9 - 10.3 mg/dL Final   Total Protein 63/87/5643 7.8  6.5 - 8.1 g/dL Final   Albumin 32/95/1884 3.8  3.5 - 5.0 g/dL Final   AST 16/60/6301 55 (H)  15 - 41 U/L Final   ALT 01/10/2022 36  0 - 44 U/L Final   Alkaline Phosphatase 01/10/2022 76  38 - 126 U/L Final   Total Bilirubin 01/10/2022 3.3 (H)  0.3 - 1.2 mg/dL Final   GFR, Estimated 01/10/2022 >60  >60 mL/min Final   Comment: (NOTE) Calculated using the CKD-EPI Creatinine Equation (2021)    Anion gap 01/10/2022 10  5 - 15 Final   Performed at [facility], [address], [place], [place] [address]   WBC 01/10/2022 5.7  4.0 - 10.5 K/uL Final   RBC 01/10/2022 5.16  4.22 - 5.81 MIL/uL Final   Hemoglobin 01/10/2022 11.8 (L)  13.0 - 17.0 g/dL Final  HCT 01/10/2022 39.0  39.0 - 52.0 % Final   MCV 01/10/2022 75.6 (L)  80.0 - 100.0 fL Final   MCH 01/10/2022 22.9 (L)  26.0 - 34.0 pg Final   MCHC 01/10/2022 30.3  30.0 - 36.0 g/dL Final   RDW 78/29/5621 18.4 (H)  11.5 - 15.5 % Final   Platelets 01/10/2022 138 (L)  150 - 400 K/uL Final   REPEATED TO VERIFY   nRBC 01/10/2022 0.0  0.0 - 0.2 % Final   Neutrophils Relative % 01/10/2022 61  % Final   Neutro Abs 01/10/2022 3.5  1.7 - 7.7 K/uL Final   Lymphocytes Relative 01/10/2022 27  % Final   Lymphs Abs 01/10/2022 1.5  0.7 - 4.0 K/uL Final   Monocytes Relative 01/10/2022 10  % Final   Monocytes Absolute 01/10/2022 0.6  0.1 - 1.0 K/uL Final   Eosinophils Relative 01/10/2022 1  % Final   Eosinophils Absolute 01/10/2022 0.0  0.0 - 0.5 K/uL Final   Basophils Relative 01/10/2022 1  % Final   Basophils Absolute 01/10/2022 0.1  0.0 - 0.1 K/uL Final   Immature  Granulocytes 01/10/2022 0  % Final   Abs Immature Granulocytes 01/10/2022 0.02  0.00 - 0.07 K/uL Final   Performed at [facility], [address] [place], [place] [address]   Opiates 01/10/2022 NONE DETECTED  NONE DETECTED Final   Cocaine 01/10/2022 NONE DETECTED  NONE DETECTED Final   Benzodiazepines 01/10/2022 NONE DETECTED  NONE DETECTED Final   Amphetamines 01/10/2022 NONE DETECTED  NONE DETECTED Final   Tetrahydrocannabinol 01/10/2022 NONE DETECTED  NONE DETECTED Final   Barbiturates 01/10/2022 NONE DETECTED  NONE DETECTED Final   Comment: (NOTE) DRUG SCREEN FOR MEDICAL PURPOSES ONLY.  IF CONFIRMATION IS NEEDED FOR ANY PURPOSE, NOTIFY LAB WITHIN 5 DAYS.  LOWEST DETECTABLE LIMITS FOR URINE DRUG SCREEN Drug Class                     Cutoff (ng/mL) Amphetamine and metabolites    1000 Barbiturate and metabolites    200 Benzodiazepine                 200 Tricyclics and metabolites     300 Opiates and metabolites        300 Cocaine and metabolites        300 THC                            50 Performed at [facility], [address], [place], [place] [address]    Color, Urine 01/10/2022 YELLOW  YELLOW Final   APPearance 01/10/2022 CLEAR  CLEAR Final   Specific Gravity, Urine 01/10/2022 1.003 (L)  1.005 - 1.030 Final   pH 01/10/2022 5.0  5.0 - 8.0 Final   Glucose, UA 01/10/2022 NEGATIVE  NEGATIVE mg/dL Final   Hgb urine dipstick 01/10/2022 SMALL (A)  NEGATIVE Final   Bilirubin Urine 01/10/2022 NEGATIVE  NEGATIVE Final   Ketones, ur 01/10/2022 5 (A)  NEGATIVE mg/dL Final   Protein, ur 62/95/2841 NEGATIVE  NEGATIVE mg/dL Final   Nitrite 32/44/0102 NEGATIVE  NEGATIVE Final   Leukocytes,Ua 01/10/2022 NEGATIVE  NEGATIVE Final   WBC, UA 01/10/2022 0-5  0 - 5 WBC/hpf Final   Bacteria, UA 01/10/2022 NONE SEEN  NONE SEEN Final   Squamous Epithelial / LPF 01/10/2022 0-5  0 - 5 Final   Performed at [facility], [address] [place], [place] [address]   Lipase  01/10/2022 37  11 - 51 U/L Final   Performed at [facility], [address], [place], [place] [address]   Total CK 01/10/2022 163  49 - 397 U/L Final   Performed at [facility], [address], [place], [place] [address]  There may be more visits with results that are not included.    Blood Alcohol level:  Lab Results  Component Value Date   ETH <10 06/11/2022   ETH 194 (H) 05/29/2022    Metabolic Disorder Labs: Lab Results  Component Value Date   HGBA1C 5.2 05/24/2022   MPG 102.54 05/24/2022   MPG 108 05/06/2009   No results found for: "PROLACTIN" Lab Results  Component Value Date   CHOL 140 05/31/2022   TRIG 91 05/31/2022   HDL 54 05/31/2022   CHOLHDL 2.6 05/31/2022   VLDL 18 05/31/2022   LDLCALC 68 05/31/2022   LDLCALC NOT CALCULATED 05/24/2022    Therapeutic Lab Levels: Lab Results  Component Value Date   LITHIUM <0.06 (L) 02/09/2021   No results found for: "VALPROATE" Lab Results  Component Value Date   CBMZ <0.3 (L) 11/19/2010    Physical Findings   AUDIT    Flowsheet Row ED from 05/29/2022 in [facility]  Alcohol Use Disorder Identification Test Final Score (AUDIT) 14      CAGE-AID    Flowsheet Row ED to Hosp-Admission (Discharged) from 04/20/2022 in [facility]  SURGICAL ED to Hosp-Admission (Discharged) from 02/07/2021 in [facility]  CAGE-AID Score 4 0      PHQ2-9    Flowsheet Row ED [facility] 06/10/2022 in [facility] ED from 05/29/2022 in [facility] from 01/12/2022 in [facility] ED from 01/05/2022 in [facility] EMERGENCY DEPARTMENT  PHQ-2 Total Score 6 6 2 5   PHQ-9 Total Score 21 17 7 24       Flowsheet Row ED from 06/10/2022 in [facility] ED from 05/29/2022 in [facility] ED from 05[facility]24/2022 in  [facility]  C-SSRS RISK CATEGORY Moderate Risk No Risk High Risk        Musculoskeletal  Strength & Muscle Tone: within normal limits Gait & Station: normal Patient leans: N/A  Psychiatric Specialty Exam  Presentation  General Appearance: Appropriate for Environment; Casual  Eye Contact:Fair  Speech:Clear and Coherent  Speech Volume:Normal  Handedness:Right   Mood and Affect  Mood:Dysphoric  Affect:Congruent; Depressed   Thought Process  Thought Processes:Goal Directed  Descriptions of Associations:Circumstantial  Orientation:Full (Time, Place and Person)  Thought Content:Logical  Diagnosis of Schizophrenia or Schizoaffective disorder in past: No    Hallucinations:Hallucinations: Tactile  Ideas of Reference:None  Suicidal Thoughts:Suicidal Thoughts: No SI Active Intent and/or Plan: Without Intent; Without Plan  Homicidal Thoughts:Homicidal Thoughts: No   Sensorium  Memory:Immediate Fair; Recent Fair  Judgment:Fair  Insight:Fair   Executive Functions  Concentration:Fair  Attention Span:Fair  Recall:Fair  Fund of Knowledge:Fair  Language:Fair   Psychomotor Activity  Psychomotor Activity:Psychomotor Activity: Psychomotor Retardation   Assets  Assets:Desire for Improvement; Resilience; Communication Skills   Sleep  Sleep:Sleep: Poor   Nutritional Assessment (For OBS and FBC admissions only) Has the patient had a weight loss or gain of 10 pounds or more in the last 3 months?: No Has the patient had  a decrease in food intake/or appetite?: No Does the patient have dental problems?: No Does the patient have eating habits or behaviors that may be indicators of an eating disorder including binging or inducing vomiting?: No Has the patient recently lost weight without trying?: 0 Has the patient been eating poorly because of a decreased appetite?: 0 Malnutrition Screening Tool Score: 0    Physical Exam   Physical Exam Constitutional:      Appearance: Normal appearance.  HENT:     Head: Normocephalic and atraumatic.  Pulmonary:     Effort: Pulmonary effort is normal.  Neurological:     Mental Status: He is alert and oriented to person, place, and time.    Review of Systems  Gastrointestinal:  Negative for diarrhea, nausea and vomiting.  Psychiatric/Behavioral:  Positive for depression, hallucinations and substance abuse. Negative for suicidal ideas. The patient has insomnia.    Blood pressure (!) 127/94, pulse 63, temperature 98.3 F (36.8 C), temperature source Oral, resp. rate 18, SpO2 100 %. There is no height or weight on file to calculate BMI.  Treatment Plan Summary: Daily contact with patient to assess and evaluate symptoms and progress in treatment and Medication management [name] is a 66 year old male patient with a past psychiatric history significant of alcohol use disorder, depression, anxiety, and suicide attempt who presented to the [facility] behavioral health urgent care voluntary accompanied by law enforcement with a chief complaint of suicidal ideations with a plan and alcohol detox.  On assessment today patient does endorse symptoms of EtOH withdrawal.  Patient has a history of doing well on scheduled taper.  Patient is also 66 years old, increasing need for scheduled taper to decrease risk of further complications from either under use or overuse of Ativan.  Patient also has underline dysphoric mood that was previously treated with Seroquel successfully, will restart this and intend to titrate up over course of stay  Labs reviewed: CBC-WBC 3.9/Hgb 9.2/HCT 31.4/MCV 68.9/PLT 121, CMP-T. 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

## 2022-06-11 NOTE — ED Notes (Signed)
Pt went in his room to lay he stressed how sleepy he was and he hasn't been able to sleep. Pt calm and cooperative. Will continue to monitor for safety

## 2022-06-11 NOTE — ED Provider Notes (Signed)
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 FA:[id number]    Free T4 05/31/2022 0.81  0.61 - 1.12 ng/dL Final   Comment: (NOTE) Biotin ingestion may interfere with free T4 tests. If the results are inconsistent with the TSH level, previous test results, or the clinical presentation, then consider  biotin interference. If needed, order repeat testing after stopping biotin. Performed at [facility], [address] [place], [place] [address]    Cholesterol 05/31/2022 140  0 - 200 mg/dL Final   Triglycerides 52/84/1324 91  <150 mg/dL Final   HDL 40/07/2724 54  >40 mg/dL Final   Total CHOL/HDL Ratio 05/31/2022 2.6  RATIO Final   VLDL 05/31/2022 18  0 - 40 mg/dL Final   LDL Cholesterol 05/31/2022 68  0 - 99 mg/dL Final   Comment:        Total Cholesterol/HDL:CHD Risk Coronary Heart Disease Risk Table                     Men   Women  1/2 Average Risk   3.4   3.3  Average Risk       5.0   4.4  2 X Average Risk   9.6   7.1  3 X Average Risk  23.4   11.0        Use the calculated Patient Ratio above and the CHD Risk Table to determine the patient's CHD Risk.        ATP III CLASSIFICATION (LDL):  <100     mg/dL   Optimal  366-440  mg/dL   Near or Above  Optimal  130-159  mg/dL   Borderline  409-811  mg/dL   High  >914     mg/dL   Very High Performed at [facility], [address], [place], [place] [address]   Admission on 05/24/2022, Discharged on 05/25/2022  Component Date Value Ref Range Status   SARS Coronavirus 2 by RT PCR 05/24/2022 NEGATIVE  NEGATIVE Final   Comment: (NOTE) SARS-CoV-2 target nucleic acids are NOT DETECTED.  The SARS-CoV-2 RNA is generally detectable in upper respiratory specimens during the acute phase of infection. The lowest concentration of SARS-CoV-2 viral copies this assay can detect is 138 copies/mL. A negative result does not preclude SARS-Cov-2 infection and should not be used as the sole basis for treatment or other patient management decisions. A negative result may occur with  improper specimen collection/handling, submission of specimen other than nasopharyngeal swab, presence of viral mutation(s) within the areas targeted by this assay, and inadequate number of viral copies(<138 copies/mL). A negative  result must be combined with clinical observations, patient history, and epidemiological information. The expected result is Negative.  Fact Sheet for Patients:  BloggerCourse.com  Fact Sheet for Healthcare Providers:  SeriousBroker.it  This test is no                          t yet approved or cleared by the [place] FDA and  has been authorized for detection and/or diagnosis of SARS-CoV-2 by FDA under an Emergency Use Authorization (EUA). This EUA will remain  in effect (meaning this test can be used) for the duration of the COVID-19 declaration under Section 564(b)(1) of the Act, 21 U.S.C.section 360bbb-3(b)(1), unless the authorization is terminated  or revoked sooner.       Influenza A by PCR 05/24/2022 NEGATIVE  NEGATIVE Final   Influenza B by PCR 05/24/2022 NEGATIVE  NEGATIVE Final   Comment: (NOTE) The Xpert Xpress SARS-CoV-2/FLU/RSV plus assay is intended as an aid in the diagnosis of influenza from Nasopharyngeal swab specimens and should not be used as a sole basis for treatment. Nasal washings and aspirates are unacceptable for Xpert Xpress SARS-CoV-2/FLU/RSV testing.  Fact Sheet for Patients: BloggerCourse.com  Fact Sheet for Healthcare Providers: SeriousBroker.it  This test is not yet approved or cleared by the [place] FDA and has been authorized for detection and/or diagnosis of SARS-CoV-2 by FDA under an Emergency Use Authorization (EUA). This EUA will remain in effect (meaning this test can be used) for the duration of the COVID-19 declaration under Section 564(b)(1) of the Act, 21 U.S.C. section 360bbb-3(b)(1), unless the authorization is terminated or revoked.  Performed at [facility], [address], [place], [place] [address]    WBC 05/24/2022 5.3  4.0 - 10.5 K/uL Final   RBC 05/24/2022 3.98 (L)  4.22 - 5.81 MIL/uL Final    Hemoglobin 05/24/2022 8.4 (L)  13.0 - 17.0 g/dL Final   Comment: Reticulocyte Hemoglobin testing may be clinically indicated, consider ordering this additional test [id number]    HCT 05/24/2022 27.4 (L)  39.0 - 52.0 % Final   MCV 05/24/2022 68.8 (L)  80.0 - 100.0 fL Final   MCH 05/24/2022 21.1 (L)  26.0 - 34.0 pg Final   MCHC 05/24/2022 30.7  30.0 - 36.0 g/dL Final   RDW 62/95/2841 19.3 (H)  11.5 - 15.5 % Final   Platelets 05/24/2022 149 (L)  150 - 400 K/uL Final   REPEATED TO VERIFY   nRBC  05/24/2022 0.0  0.0 - 0.2 % Final   Neutrophils Relative % 05/24/2022 39  % Final   Neutro Abs 05/24/2022 2.0  1.7 - 7.7 K/uL Final   Lymphocytes Relative 05/24/2022 45  % Final   Lymphs Abs 05/24/2022 2.4  0.7 - 4.0 K/uL Final   Monocytes Relative 05/24/2022 11  % Final   Monocytes Absolute 05/24/2022 0.6  0.1 - 1.0 K/uL Final   Eosinophils Relative 05/24/2022 3  % Final   Eosinophils Absolute 05/24/2022 0.2  0.0 - 0.5 K/uL Final   Basophils Relative 05/24/2022 2  % Final   Basophils Absolute 05/24/2022 0.1  0.0 - 0.1 K/uL Final   Immature Granulocytes 05/24/2022 0  % Final   Abs Immature Granulocytes 05/24/2022 0.01  0.00 - 0.07 K/uL Final   Performed at [facility], [address], [place], [place] [address]   Sodium 05/24/2022 139  135 - 145 mmol/L Final   Potassium 05/24/2022 3.9  3.5 - 5.1 mmol/L Final   Chloride 05/24/2022 108  98 - 111 mmol/L Final   CO2 05/24/2022 21 (L)  22 - 32 mmol/L Final   Glucose, Bld 05/24/2022 100 (H)  70 - 99 mg/dL Final   Glucose reference range applies only to samples taken after fasting for at least 8 hours.   BUN 05/24/2022 12  8 - 23 mg/dL Final   Creatinine, Ser 05/24/2022 1.04  0.61 - 1.24 mg/dL Final   Calcium 60/45/4098 9.0  8.9 - 10.3 mg/dL Final   Total Protein 11/91/4782 7.4  6.5 - 8.1 g/dL Final   Albumin 95/62/1308 3.6  3.5 - 5.0 g/dL Final   AST 65/78/4696 36  15 - 41 U/L Final   ALT 05/24/2022 26  0 - 44 U/L Final   Alkaline  Phosphatase 05/24/2022 77  38 - 126 U/L Final   Total Bilirubin 05/24/2022 1.2  0.3 - 1.2 mg/dL Final   GFR, Estimated 05/24/2022 >60  >60 mL/min Final   Comment: (NOTE) Calculated using the CKD-EPI Creatinine Equation (2021)    Anion gap 05/24/2022 10  5 - 15 Final   Performed at [facility], [address], [place], [place] [address]   Hgb A1c MFr Bld 05/24/2022 5.2  4.8 - 5.6 % Final   Comment: (NOTE) Pre diabetes:          5.7%-6.4%  Diabetes:              >6.4%  Glycemic control for   <7.0% adults with diabetes    Mean Plasma Glucose 05/24/2022 102.54  mg/dL Final   Performed at [facility], [address] [place], [place] [address]   Alcohol, Ethyl (B) 05/24/2022 99 (H)  <10 mg/dL Final   Comment: (NOTE) Lowest detectable limit for serum alcohol is 10 mg/dL.  For medical purposes only. Performed at [facility], [address] [place], [place] [address]    Hepatitis B Surface Ag 05/24/2022 NON REACTIVE  NON REACTIVE Final   HCV Ab 05/24/2022 Reactive (A)  NON REACTIVE Final   Comment: (NOTE) The CDC recommends that a Reactive HCV antibody result be followed up  with a HCV Nucleic Acid Amplification test.     Hep A IgM 05/24/2022 NON REACTIVE  NON REACTIVE Final   Hep B C IgM 05/24/2022 NON REACTIVE  NON REACTIVE Final   Performed at [facility], [address], [place], [place] [address]   POC Amphetamine UR 05/25/2022  None Detected  NONE DETECTED (Cut Off Level 1000 ng/mL) Final   POC Secobarbital (BAR) 05/25/2022 None Detected  NONE DETECTED (Cut Off Level 300 ng/mL) Final   POC Buprenorphine (BUP) 05/25/2022 None Detected  NONE DETECTED (Cut Off Level 10 ng/mL) Final   POC Oxazepam (BZO) 05/25/2022 Positive (A)  NONE DETECTED (Cut Off Level 300 ng/mL) Final   POC Cocaine UR 05/25/2022 Positive (A)  NONE DETECTED (Cut Off Level 300 ng/mL) Final   POC Methamphetamine UR 05/25/2022 None Detected  NONE DETECTED (Cut Off Level 1000 ng/mL)  Final   POC Morphine 05/25/2022 None Detected  NONE DETECTED (Cut Off Level 300 ng/mL) Final   POC Methadone UR 05/25/2022 None Detected  NONE DETECTED (Cut Off Level 300 ng/mL) Final   POC Oxycodone UR 05/25/2022 None Detected  NONE DETECTED (Cut Off Level 100 ng/mL) Final   POC Marijuana UR 05/25/2022 None Detected  NONE DETECTED (Cut Off Level 50 ng/mL) Final   SARSCOV2ONAVIRUS 2 AG 05/24/2022 NEGATIVE  NEGATIVE Final   Comment: (NOTE) SARS-CoV-2 antigen NOT DETECTED.   Negative results are presumptive.  Negative results do not preclude SARS-CoV-2 infection and should not be used as the sole basis for treatment or other patient management decisions, including infection  control decisions, particularly in the presence of clinical signs and  symptoms consistent with COVID-19, or in those who have been in contact with the virus.  Negative results must be combined with clinical observations, patient history, and epidemiological information. The expected result is Negative.  Fact Sheet for Patients: https://www.jennings-kim.com/  Fact Sheet for Healthcare Providers: https://alexander-rogers.biz/  This test is not yet approved or cleared by the [place] FDA and  has been authorized for detection and/or diagnosis of SARS-CoV-2 by FDA under an Emergency Use Authorization (EUA).  This EUA will remain in effect (meaning this test can be used) for the duration of  the COV                          ID-19 declaration under Section 564(b)(1) of the Act, 21 U.S.C. section 360bbb-3(b)(1), unless the authorization is terminated or revoked sooner.     Cholesterol 05/24/2022 141  0 - 200 mg/dL Final   Triglycerides 16/07/9603 94  <150 mg/dL Final   HDL 54/06/8118 57  >40 mg/dL Final   Total CHOL/HDL Ratio 05/24/2022 2.5  RATIO Final   VLDL 05/24/2022 19  0 - 40 mg/dL Final   LDL Cholesterol 05/24/2022 NOT CALCULATED  0 - 99 mg/dL Final   Performed at [facility], [address], [place], [place] [address]   TSH 05/24/2022 2.834  0.350 - 4.500 uIU/mL Final   Comment: Performed by a 3rd Generation assay with a functional sensitivity of <=0.01 uIU/mL. Performed at [facility], [address] [place], [place] [address]   Admission on 05/10/2022, Discharged on 05/11/2022  Component Date Value Ref Range Status   SARS Coronavirus 2 by RT PCR 05/10/2022 NEGATIVE  NEGATIVE Final   Comment: (NOTE) SARS-CoV-2 target nucleic acids are NOT DETECTED.  The SARS-CoV-2 RNA is generally detectable in upper respiratory specimens during the acute phase of infection. The lowest concentration of SARS-CoV-2 viral copies this assay can detect is 138 copies/mL. A negative result does not preclude SARS-Cov-2 infection and should not be used as the sole basis for treatment or other patient management decisions. A negative result may occur with  improper specimen collection/handling, submission of specimen other  than nasopharyngeal swab, presence of viral mutation(s) within the areas targeted by this assay, and inadequate number of viral copies(<138 copies/mL). A negative result must be combined with clinical observations, patient history, and epidemiological information. The expected result is Negative.  Fact Sheet for Patients:  BloggerCourse.com  Fact Sheet for Healthcare Providers:  SeriousBroker.it  This test is no                          t yet approved or cleared by the [place] FDA and  has been authorized for detection and/or diagnosis of SARS-CoV-2 by FDA under an Emergency Use Authorization (EUA). This EUA will remain  in effect (meaning this test can be used) for the duration of the COVID-19 declaration under Section 564(b)(1) of the Act, 21 U.S.C.section 360bbb-3(b)(1), unless the authorization is terminated  or revoked sooner.       Influenza A by PCR 05/10/2022 NEGATIVE   NEGATIVE Final   Influenza B by PCR 05/10/2022 NEGATIVE  NEGATIVE Final   Comment: (NOTE) The Xpert Xpress SARS-CoV-2/FLU/RSV plus assay is intended as an aid in the diagnosis of influenza from Nasopharyngeal swab specimens and should not be used as a sole basis for treatment. Nasal washings and aspirates are unacceptable for Xpert Xpress SARS-CoV-2/FLU/RSV testing.  Fact Sheet for Patients: BloggerCourse.com  Fact Sheet for Healthcare Providers: SeriousBroker.it  This test is not yet approved or cleared by the [place] FDA and has been authorized for detection and/or diagnosis of SARS-CoV-2 by FDA under an Emergency Use Authorization (EUA). This EUA will remain in effect (meaning this test can be used) for the duration of the COVID-19 declaration under Section 564(b)(1) of the Act, 21 U.S.C. section 360bbb-3(b)(1), unless the authorization is terminated or revoked.  Performed at [facility], [address] [place], [place] [address]    Sodium 05/10/2022 137  135 - 145 mmol/L Final   Potassium 05/10/2022 3.7  3.5 - 5.1 mmol/L Final   Chloride 05/10/2022 106  98 - 111 mmol/L Final   CO2 05/10/2022 23  22 - 32 mmol/L Final   Glucose, Bld 05/10/2022 90  70 - 99 mg/dL Final   Glucose reference range applies only to samples taken after fasting for at least 8 hours.   BUN 05/10/2022 13  8 - 23 mg/dL Final   Creatinine, Ser 05/10/2022 1.04  0.61 - 1.24 mg/dL Final   Calcium 47/82/9562 8.8 (L)  8.9 - 10.3 mg/dL Final   Total Protein 13/05/6577 7.8  6.5 - 8.1 g/dL Final   Albumin 46/96/2952 3.6  3.5 - 5.0 g/dL Final   AST 84/13/2440 30  15 - 41 U/L Final   ALT 05/10/2022 21  0 - 44 U/L Final   Alkaline Phosphatase 05/10/2022 62  38 - 126 U/L Final   Total Bilirubin 05/10/2022 0.8  0.3 - 1.2 mg/dL Final   GFR, Estimated 05/10/2022 >60  >60 mL/min Final   Comment: (NOTE) Calculated using the CKD-EPI Creatinine  Equation (2021)    Anion gap 05/10/2022 8  5 - 15 Final   Performed at [facility], [address], [place], [place] [address]   Alcohol, Ethyl (B) 05/10/2022 156 (H)  <10 mg/dL Final   Comment: (NOTE) Lowest detectable limit for serum alcohol is 10 mg/dL.  For medical purposes only. Performed at [facility], [address], [place], [place] [address]    Opiates 05/10/2022 POSITIVE (A)  NONE DETECTED  Final   Cocaine 05/10/2022 POSITIVE (A)  NONE DETECTED Final   Benzodiazepines 05/10/2022 POSITIVE (A)  NONE DETECTED Final   Amphetamines 05/10/2022 NONE DETECTED  NONE DETECTED Final   Tetrahydrocannabinol 05/10/2022 NONE DETECTED  NONE DETECTED Final   Barbiturates 05/10/2022 NONE DETECTED  NONE DETECTED Final   Comment: (NOTE) DRUG SCREEN FOR MEDICAL PURPOSES ONLY.  IF CONFIRMATION IS NEEDED FOR ANY PURPOSE, NOTIFY LAB WITHIN 5 DAYS.  LOWEST DETECTABLE LIMITS FOR URINE DRUG SCREEN Drug Class                     Cutoff (ng/mL) Amphetamine and metabolites    1000 Barbiturate and metabolites    200 Benzodiazepine                 200 Tricyclics and metabolites     300 Opiates and metabolites        300 Cocaine and metabolites        300 THC                            50 Performed at [facility], [address], [place], [place] [address]    WBC 05/10/2022 4.5  4.0 - 10.5 K/uL Final   RBC 05/10/2022 3.77 (L)  4.22 - 5.81 MIL/uL Final   Hemoglobin 05/10/2022 8.3 (L)  13.0 - 17.0 g/dL Final   Comment: Reticulocyte Hemoglobin testing may be clinically indicated, consider ordering this additional test [id number]    HCT 05/10/2022 27.3 (L)  39.0 - 52.0 % Final   MCV 05/10/2022 72.4 (L)  80.0 - 100.0 fL Final   MCH 05/10/2022 22.0 (L)  26.0 - 34.0 pg Final   MCHC 05/10/2022 30.4  30.0 - 36.0 g/dL Final   RDW 84/13/2440 19.8 (H)  11.5 - 15.5 % Final   Platelets 05/10/2022 162  150 - 400 K/uL Final   nRBC 05/10/2022 0.0   0.0 - 0.2 % Final   Neutrophils Relative % 05/10/2022 55  % Final   Neutro Abs 05/10/2022 2.5  1.7 - 7.7 K/uL Final   Lymphocytes Relative 05/10/2022 32  % Final   Lymphs Abs 05/10/2022 1.4  0.7 - 4.0 K/uL Final   Monocytes Relative 05/10/2022 10  % Final   Monocytes Absolute 05/10/2022 0.4  0.1 - 1.0 K/uL Final   Eosinophils Relative 05/10/2022 2  % Final   Eosinophils Absolute 05/10/2022 0.1  0.0 - 0.5 K/uL Final   Basophils Relative 05/10/2022 1  % Final   Basophils Absolute 05/10/2022 0.1  0.0 - 0.1 K/uL Final   Immature Granulocytes 05/10/2022 0  % Final   Abs Immature Granulocytes 05/10/2022 0.01  0.00 - 0.07 K/uL Final   Performed at [facility], [address] [place], [place] [address]   Salicylate Lvl 05/10/2022 <7.0 (L)  7.0 - 30.0 mg/dL Final   Performed at [facility], [address] [place], [place] [address]   Acetaminophen (Tylenol), Serum 05/10/2022 <10 (L)  10 - 30 ug/mL Final   Comment: (NOTE) Therapeutic concentrations vary significantly. A range of 10-30 ug/mL  may be an effective concentration for many patients. However, some  are best treated at concentrations outside of this range. Acetaminophen concentrations >150 ug/mL at 4 hours after ingestion  and >50 ug/mL at 12 hours after ingestion are often associated with  toxic reactions.  Performed at [facility], [address]  [place], [place] [address]    Magnesium 05/10/2022 2.0  1.7 - 2.4 mg/dL Final   Performed at [facility], [address], [place], [place] [address]   Color, Urine 05/10/2022 STRAW (A)  YELLOW Final   APPearance 05/10/2022 CLEAR  CLEAR Final   Specific Gravity, Urine 05/10/2022 1.005  1.005 - 1.030 Final   pH 05/10/2022 5.0  5.0 - 8.0 Final   Glucose, UA 05/10/2022 NEGATIVE  NEGATIVE mg/dL Final   Hgb urine dipstick 05/10/2022 NEGATIVE  NEGATIVE Final   Bilirubin Urine 05/10/2022 NEGATIVE  NEGATIVE Final    Ketones, ur 05/10/2022 NEGATIVE  NEGATIVE mg/dL Final   Protein, ur 09/81/1914 NEGATIVE  NEGATIVE mg/dL Final   Nitrite 78/29/5621 NEGATIVE  NEGATIVE Final   Leukocytes,Ua 05/10/2022 NEGATIVE  NEGATIVE Final   Performed at [facility], [address] [place], [place] [address]  Admission on 05/05/2022, Discharged on 05/06/2022  Component Date Value Ref Range Status   WBC 05/05/2022 7.3  4.0 - 10.5 K/uL Final   RBC 05/05/2022 3.68 (L)  4.22 - 5.81 MIL/uL Final   Hemoglobin 05/05/2022 8.2 (L)  13.0 - 17.0 g/dL Final   Comment: Reticulocyte Hemoglobin testing may be clinically indicated, consider ordering this additional test [id number]    HCT 05/05/2022 27.1 (L)  39.0 - 52.0 % Final   MCV 05/05/2022 73.6 (L)  80.0 - 100.0 fL Final   MCH 05/05/2022 22.3 (L)  26.0 - 34.0 pg Final   MCHC 05/05/2022 30.3  30.0 - 36.0 g/dL Final   RDW 52/84/1324 19.6 (H)  11.5 - 15.5 % Final   Platelets 05/05/2022 234  150 - 400 K/uL Final   nRBC 05/05/2022 0.0  0.0 - 0.2 % Final   Neutrophils Relative % 05/05/2022 44  % Final   Neutro Abs 05/05/2022 3.2  1.7 - 7.7 K/uL Final   Lymphocytes Relative 05/05/2022 38  % Final   Lymphs Abs 05/05/2022 2.8  0.7 - 4.0 K/uL Final   Monocytes Relative 05/05/2022 13  % Final   Monocytes Absolute 05/05/2022 0.9  0.1 - 1.0 K/uL Final   Eosinophils Relative 05/05/2022 4  % Final   Eosinophils Absolute 05/05/2022 0.3  0.0 - 0.5 K/uL Final   Basophils Relative 05/05/2022 1  % Final   Basophils Absolute 05/05/2022 0.1  0.0 - 0.1 K/uL Final   Immature Granulocytes 05/05/2022 0  % Final   Abs Immature Granulocytes 05/05/2022 0.02  0.00 - 0.07 K/uL Final   Performed at [facility], [address] [place], [place] [address]   Sodium 05/05/2022 138  135 - 145 mmol/L Final   Potassium 05/05/2022 3.8  3.5 - 5.1 mmol/L Final   Chloride 05/05/2022 109  98 - 111 mmol/L Final   CO2 05/05/2022 17 (L)  22 - 32 mmol/L Final   Glucose, Bld 05/05/2022 91   70 - 99 mg/dL Final   Glucose reference range applies only to samples taken after fasting for at least 8 hours.   BUN 05/05/2022 9  8 - 23 mg/dL Final   Creatinine, Ser 05/05/2022 0.87  0.61 - 1.24 mg/dL Final   Calcium 72/53/6644 8.6 (L)  8.9 - 10.3 mg/dL Final   Total Protein 03/47/4259 7.9  6.5 - 8.1 g/dL Final   Albumin 56/38/7564 3.3 (L)  3.5 - 5.0 g/dL Final   AST 33/29/5188 33  15 - 41 U/L Final   ALT 05/05/2022 23  0 - 44 U/L Final   Alkaline  Phosphatase 05/05/2022 60  38 - 126 U/L Final   Total Bilirubin 05/05/2022 0.9  0.3 - 1.2 mg/dL Final   GFR, Estimated 05/05/2022 >60  >60 mL/min Final   Comment: (NOTE) Calculated using the CKD-EPI Creatinine Equation (2021)    Anion gap 05/05/2022 12  5 - 15 Final   Performed at [facility], [address], [place], [place] [address]   Alcohol, Ethyl (B) 05/05/2022 169 (H)  <10 mg/dL Final   Comment: (NOTE) Lowest detectable limit for serum alcohol is 10 mg/dL.  For medical purposes only. Performed at [facility], [address], [place], [place] [address]    Salicylate Lvl 05/05/2022 <7.0 (L)  7.0 - 30.0 mg/dL Final   Performed at [facility], [address] [place], [place] [address]   Acetaminophen (Tylenol), Serum 05/05/2022 <10 (L)  10 - 30 ug/mL Final   Comment: (NOTE) Therapeutic concentrations vary significantly. A range of 10-30 ug/mL  may be an effective concentration for many patients. However, some  are best treated at concentrations outside of this range. Acetaminophen concentrations >150 ug/mL at 4 hours after ingestion  and >50 ug/mL at 12 hours after ingestion are often associated with  toxic reactions.  Performed at [facility], [address] [place], [place] [address]    Opiates 05/06/2022 NONE DETECTED  NONE DETECTED Final   Cocaine 05/06/2022 NONE DETECTED  NONE DETECTED Final   Benzodiazepines 05/06/2022 POSITIVE (A)  NONE DETECTED Final   Amphetamines 05/06/2022 NONE  DETECTED  NONE DETECTED Final   Tetrahydrocannabinol 05/06/2022 NONE DETECTED  NONE DETECTED Final   Barbiturates 05/06/2022 NONE DETECTED  NONE DETECTED Final   Comment: (NOTE) DRUG SCREEN FOR MEDICAL PURPOSES ONLY.  IF CONFIRMATION IS NEEDED FOR ANY PURPOSE, NOTIFY LAB WITHIN 5 DAYS.  LOWEST DETECTABLE LIMITS FOR URINE DRUG SCREEN Drug Class                     Cutoff (ng/mL) Amphetamine and metabolites    1000 Barbiturate and metabolites    200 Benzodiazepine                 200 Tricyclics and metabolites     300 Opiates and metabolites        300 Cocaine and metabolites        300 THC                            50 Performed at [facility], [address] [place], [place] [address]    SARS Coronavirus 2 by RT PCR 05/06/2022 NEGATIVE  NEGATIVE Final   Comment: (NOTE) SARS-CoV-2 target nucleic acids are NOT DETECTED.  The SARS-CoV-2 RNA is generally detectable in upper respiratory specimens during the acute phase of infection. The lowest concentration of SARS-CoV-2 viral copies this assay can detect is 138 copies/mL. A negative result does not preclude SARS-Cov-2 infection and should not be used as the sole basis for treatment or other patient management decisions. A negative result may occur with  improper specimen collection/handling, submission of specimen other than nasopharyngeal swab, presence of viral mutation(s) within the areas targeted by this assay, and inadequate number of viral copies(<138 copies/mL). A negative result must be combined with clinical observations, patient history, and epidemiological information. The expected result is Negative.  Fact Sheet for Patients:  BloggerCourse.com  Fact Sheet for Healthcare Providers:  SeriousBroker.it  This  test is no                          t yet approved or cleared by the [place] and  has been authorized for detection and/or diagnosis of  SARS-CoV-2 by FDA under an Emergency Use Authorization (EUA). This EUA will remain  in effect (meaning this test can be used) for the duration of the COVID-19 declaration under Section 564(b)(1) of the Act, 21 U.S.C.section 360bbb-3(b)(1), unless the authorization is terminated  or revoked sooner.       Influenza A by PCR 05/06/2022 NEGATIVE  NEGATIVE Final   Influenza B by PCR 05/06/2022 NEGATIVE  NEGATIVE Final   Comment: (NOTE) The Xpert Xpress SARS-CoV-2/FLU/RSV plus assay is intended as an aid in the diagnosis of influenza from Nasopharyngeal swab specimens and should not be used as a sole basis for treatment. Nasal washings and aspirates are unacceptable for Xpert Xpress SARS-CoV-2/FLU/RSV testing.  Fact Sheet for Patients: BloggerCourse.com  Fact Sheet for Healthcare Providers: SeriousBroker.it  This test is not yet approved or cleared by the [place] FDA and has been authorized for detection and/or diagnosis of SARS-CoV-2 by FDA under an Emergency Use Authorization (EUA). This EUA will remain in effect (meaning this test can be used) for the duration of the COVID-19 declaration under Section 564(b)(1) of the Act, 21 U.S.C. section 360bbb-3(b)(1), unless the authorization is terminated or revoked.  Performed at [facility], [address] [place], [place] [address]   Admission on 05/05/2022, Discharged on 05/05/2022  Component Date Value Ref Range Status   Sodium 05/05/2022 138  135 - 145 mmol/L Final   Potassium 05/05/2022 3.7  3.5 - 5.1 mmol/L Final   Chloride 05/05/2022 105  98 - 111 mmol/L Final   CO2 05/05/2022 19 (L)  22 - 32 mmol/L Final   Glucose, Bld 05/05/2022 91  70 - 99 mg/dL Final   Glucose reference range applies only to samples taken after fasting for at least 8 hours.   BUN 05/05/2022 11  8 - 23 mg/dL Final   Creatinine, Ser 05/05/2022 0.90  0.61 - 1.24 mg/dL Final   Calcium 60/45/4098  8.9  8.9 - 10.3 mg/dL Final   GFR, Estimated 05/05/2022 >60  >60 mL/min Final   Comment: (NOTE) Calculated using the CKD-EPI Creatinine Equation (2021)    Anion gap 05/05/2022 14  5 - 15 Final   Performed at [facility], [address] [place], [place] [address]   WBC 05/05/2022 8.0  4.0 - 10.5 K/uL Final   RBC 05/05/2022 3.78 (L)  4.22 - 5.81 MIL/uL Final   Hemoglobin 05/05/2022 8.4 (L)  13.0 - 17.0 g/dL Final   Comment: Reticulocyte Hemoglobin testing may be clinically indicated, consider ordering this additional test [id number]    HCT 05/05/2022 27.4 (L)  39.0 - 52.0 % Final   MCV 05/05/2022 72.5 (L)  80.0 - 100.0 fL Final   MCH 05/05/2022 22.2 (L)  26.0 - 34.0 pg Final   MCHC 05/05/2022 30.7  30.0 - 36.0 g/dL Final   RDW 30/86/5784 19.8 (H)  11.5 - 15.5 % Final   Platelets 05/05/2022 222  150 - 400 K/uL Final   nRBC 05/05/2022 0.0  0.0 - 0.2 % Final   Performed at [facility], [address] [place], [place] [address]   Troponin I (High Sensitivity) 05/05/2022 10  <18 ng/L Final   Comment: (NOTE) Elevated  high sensitivity troponin I (hsTnI) values and significant  changes across serial measurements may suggest ACS but many other  chronic and acute conditions are known to elevate hsTnI results.  Refer to the "Links" section for chest pain algorithms and additional  guidance. Performed at [facility], [address], [place], [place] [address]    Troponin I (High Sensitivity) 05/05/2022 7  <18 ng/L Final   Comment: (NOTE) Elevated high sensitivity troponin I (hsTnI) values and significant  changes across serial measurements may suggest ACS but many other  chronic and acute conditions are known to elevate hsTnI results.  Refer to the "Links" section for chest pain algorithms and additional  guidance. Performed at [facility], [address] [place], [place] [address]   Admission on 04/20/2022, Discharged on 05/03/2022  Component Date Value Ref  Range Status   Sodium 04/20/2022 135  135 - 145 mmol/L Final   Potassium 04/20/2022 4.1  3.5 - 5.1 mmol/L Final   Chloride 04/20/2022 100  98 - 111 mmol/L Final   CO2 04/20/2022 19 (L)  22 - 32 mmol/L Final   Glucose, Bld 04/20/2022 90  70 - 99 mg/dL Final   Glucose reference range applies only to samples taken after fasting for at least 8 hours.   BUN 04/20/2022 10  8 - 23 mg/dL Final   Creatinine, Ser 04/20/2022 1.06  0.61 - 1.24 mg/dL Final   Calcium 09/81/1914 8.8 (L)  8.9 - 10.3 mg/dL Final   Total Protein 78/29/5621 7.3  6.5 - 8.1 g/dL Final   Albumin 30/86/5784 3.4 (L)  3.5 - 5.0 g/dL Final   AST 69/62/9528 74 (H)  15 - 41 U/L Final   ALT 04/20/2022 41  0 - 44 U/L Final   Alkaline Phosphatase 04/20/2022 69  38 - 126 U/L Final   Total Bilirubin 04/20/2022 1.4 (H)  0.3 - 1.2 mg/dL Final   GFR, Estimated 04/20/2022 >60  >60 mL/min Final   Comment: (NOTE) Calculated using the CKD-EPI Creatinine Equation (2021)    Anion gap 04/20/2022 16 (H)  5 - 15 Final   Performed at [facility], [address] [place], [place] [address]   Sodium 04/20/2022 135  135 - 145 mmol/L Final   Potassium 04/20/2022 4.2  3.5 - 5.1 mmol/L Final   Chloride 04/20/2022 103  98 - 111 mmol/L Final   BUN 04/20/2022 10  8 - 23 mg/dL Final   Creatinine, Ser 04/20/2022 1.30 (H)  0.61 - 1.24 mg/dL Final   Glucose, Bld 40/07/2724 93  70 - 99 mg/dL Final   Glucose reference range applies only to samples taken after fasting for at least 8 hours.   Calcium, Ion 04/20/2022 0.88 (LL)  1.15 - 1.40 mmol/L Final   TCO2 04/20/2022 21 (L)  22 - 32 mmol/L Final   Hemoglobin 04/20/2022 12.9 (L)  13.0 - 17.0 g/dL Final   HCT 36/64/4034 38.0 (L)  39.0 - 52.0 % Final   Comment 04/20/2022 NOTIFIED PHYSICIAN   Final   WBC 04/20/2022 6.9  4.0 - 10.5 K/uL Final   RBC 04/20/2022 4.91  4.22 - 5.81 MIL/uL Final   Hemoglobin 04/20/2022 11.1 (L)  13.0 - 17.0 g/dL Final   HCT 74/25/9563 38.0 (L)  39.0 - 52.0 % Final   MCV  04/20/2022 77.4 (L)  80.0 - 100.0 fL Final   MCH 04/20/2022 22.6 (L)  26.0 - 34.0 pg Final   MCHC 04/20/2022 29.2 (L)  30.0 - 36.0 g/dL Final   RDW 16/07/9603 21.0 (H)  11.5 - 15.5 % Final   Platelets 04/20/2022 139 (L)  150 - 400 K/uL Final   nRBC 04/20/2022 0.0  0.0 - 0.2 % Final   Performed at [facility], [address] [place], [place] [address]   Alcohol, Ethyl (B) 04/20/2022 262 (H)  <10 mg/dL Final   Comment: (NOTE) Lowest detectable limit for serum alcohol is 10 mg/dL.  For medical purposes only. Performed at [facility], [address], [place], [place] [address]    Color, Urine 04/20/2022 YELLOW  YELLOW Final   APPearance 04/20/2022 CLEAR  CLEAR Final   Specific Gravity, Urine 04/20/2022 1.008  1.005 - 1.030 Final   pH 04/20/2022 7.0  5.0 - 8.0 Final   Glucose, UA 04/20/2022 NEGATIVE  NEGATIVE mg/dL Final   Hgb urine dipstick 04/20/2022 NEGATIVE  NEGATIVE Final   Bilirubin Urine 04/20/2022 NEGATIVE  NEGATIVE Final   Ketones, ur 04/20/2022 NEGATIVE  NEGATIVE mg/dL Final   Protein, ur 78/29/5621 NEGATIVE  NEGATIVE mg/dL Final   Nitrite 30/86/5784 NEGATIVE  NEGATIVE Final   Leukocytes,Ua 04/20/2022 NEGATIVE  NEGATIVE Final   Performed at [facility], [address], [place], [place] [address]   Lactic Acid, Venous 04/20/2022 3.2 (HH)  0.5 - 1.9 mmol/L Final   Comment: CRITICAL RESULT CALLED TO, READ BACK BY AND VERIFIED WITH: M.RUGGIERO,RN @1959  04/20/2022 [email or web address] Performed at [facility], [address] [place], [place] [address]    Prothrombin Time 04/20/2022 14.1  11.4 - 15.2 seconds Final   INR 04/20/2022 1.1  0.8 - 1.2 Final   Comment: (NOTE) INR goal varies based on device and disease states. Performed at [facility], [address], [place], [place] [address]    Blood Bank Specimen 04/20/2022 SAMPLE AVAILABLE FOR TESTING   Final   Sample Expiration 04/20/2022    Final                   Value:04/21/2022,2359 Performed at  [facility], [address], [place], [place] [address]    Sodium 04/21/2022 141  135 - 145 mmol/L Final   Potassium 04/21/2022 4.1  3.5 - 5.1 mmol/L Final   Chloride 04/21/2022 102  98 - 111 mmol/L Final   CO2 04/21/2022 25  22 - 32 mmol/L Final   Glucose, Bld 04/21/2022 104 (H)  70 - 99 mg/dL Final   Glucose reference range applies only to samples taken after fasting for at least 8 hours.   BUN 04/21/2022 12  8 - 23 mg/dL Final   Creatinine, Ser 04/21/2022 0.97  0.61 - 1.24 mg/dL Final   Calcium 53/66/4403 9.1  8.9 - 10.3 mg/dL Final   GFR, Estimated 04/21/2022 >60  >60 mL/min Final   Comment: (NOTE) Calculated using the CKD-EPI Creatinine Equation (2021)    Anion gap 04/21/2022 14  5 - 15 Final   Performed at [facility], [address], [place], [place] [address]   WBC 04/21/2022 5.8  4.0 - 10.5 K/uL Final   RBC 04/21/2022 3.94 (L)  4.22 - 5.81 MIL/uL Final   Hemoglobin 04/21/2022 9.1 (L)  13.0 - 17.0 g/dL Final   HCT 95/63/8756 29.3 (L)  39.0 - 52.0 % Final   MCV 04/21/2022 74.4 (L)  80.0 - 100.0 fL Final   MCH 04/21/2022 23.1 (L)  26.0 - 34.0 pg Final   MCHC 04/21/2022 31.1  30.0 - 36.0 g/dL Final   RDW 16/07/9603 20.3 (H)  11.5 - 15.5 % Final   Platelets 04/21/2022 99 (L)  150 - 400 K/uL Final   Comment: Immature Platelet Fraction may be clinically indicated, consider ordering this additional test [id number] REPEATED TO VERIFY PLATELET COUNT CONFIRMED BY SMEAR    nRBC 04/21/2022 0.0  0.0 - 0.2 % Final   Performed at [facility], [address] [place], [place] [address]   HIV Screen 4th Generation wRfx 04/21/2022 Non Reactive  Non Reactive Final   Performed at [facility], [address], [place], [place] [address]   Sodium 04/22/2022 138  135 - 145 mmol/L Final   Potassium 04/22/2022 3.7  3.5 - 5.1 mmol/L Final   Chloride 04/22/2022 103  98 - 111 mmol/L Final   CO2 04/22/2022 26  22 - 32 mmol/L Final   Glucose, Bld 04/22/2022 124 (H)  70 -  99 mg/dL Final   Glucose reference range applies only to samples taken after fasting for at least 8 hours.   BUN 04/22/2022 9  8 - 23 mg/dL Final   Creatinine, Ser 04/22/2022 0.90  0.61 - 1.24 mg/dL Final   Calcium 30/86/5784 8.6 (L)  8.9 - 10.3 mg/dL Final   GFR, Estimated 04/22/2022 >60  >60 mL/min Final   Comment: (NOTE) Calculated using the CKD-EPI Creatinine Equation (2021)    Anion gap 04/22/2022 9  5 - 15 Final   Performed at [facility], [address] [place], [place] [address]   WBC 04/22/2022 3.6 (L)  4.0 - 10.5 K/uL Final   RBC 04/22/2022 3.55 (L)  4.22 - 5.81 MIL/uL Final   Hemoglobin 04/22/2022 8.2 (L)  13.0 - 17.0 g/dL Final   Comment: Reticulocyte Hemoglobin testing may be clinically indicated, consider ordering this additional test [id number]    HCT 04/22/2022 26.6 (L)  39.0 - 52.0 % Final   MCV 04/22/2022 74.9 (L)  80.0 - 100.0 fL Final   MCH 04/22/2022 23.1 (L)  26.0 - 34.0 pg Final   MCHC 04/22/2022 30.8  30.0 - 36.0 g/dL Final   RDW 40/07/2724 20.5 (H)  11.5 - 15.5 % Final   Platelets 04/22/2022 73 (L)  150 - 400 K/uL Final   Comment: Immature Platelet Fraction may be clinically indicated, consider ordering this additional test [id number] REPEATED TO VERIFY    nRBC 04/22/2022 0.0  0.0 - 0.2 % Final   Performed at [facility], [address], [place], [place] [address]   WBC 04/23/2022 3.7 (L)  4.0 - 10.5 K/uL Final   RBC 04/23/2022 3.61 (L)  4.22 - 5.81 MIL/uL Final   Hemoglobin 04/23/2022 8.4 (L)  13.0 - 17.0 g/dL Final   Comment: Reticulocyte Hemoglobin testing may be clinically indicated, consider ordering this additional test [id number]    HCT 04/23/2022 27.0 (L)  39.0 - 52.0 % Final   MCV 04/23/2022 74.8 (L)  80.0 - 100.0 fL Final   MCH 04/23/2022 23.3 (L)  26.0 - 34.0 pg Final   MCHC 04/23/2022 31.1  30.0 - 36.0 g/dL Final   RDW 33/29/5188 20.9 (H)  11.5 - 15.5 % Final   Platelets 04/23/2022 73 (L)  150 - 400 K/uL Final   Comment:  Immature Platelet Fraction may be clinically indicated, consider ordering this additional test [id number] CONSISTENT WITH PREVIOUS RESULT REPEATED TO VERIFY    nRBC 04/23/2022 0.0  0.0 - 0.2 % Final   Performed  at [facility], [address] [place], [place] [address]   WBC 04/26/2022 3.8 (L)  4.0 - 10.5 K/uL Final   RBC 04/26/2022 3.34 (L)  4.22 - 5.81 MIL/uL Final   Hemoglobin 04/26/2022 7.7 (L)  13.0 - 17.0 g/dL Final   Comment: Reticulocyte Hemoglobin testing may be clinically indicated, consider ordering this additional test [id number]    HCT 04/26/2022 25.1 (L)  39.0 - 52.0 % Final   MCV 04/26/2022 75.1 (L)  80.0 - 100.0 fL Final   MCH 04/26/2022 23.1 (L)  26.0 - 34.0 pg Final   MCHC 04/26/2022 30.7  30.0 - 36.0 g/dL Final   RDW 11/91/4782 20.3 (H)  11.5 - 15.5 % Final   Platelets 04/26/2022 98 (L)  150 - 400 K/uL Final   REPEATED TO VERIFY   nRBC 04/26/2022 0.0  0.0 - 0.2 % Final   Performed at [facility], [address] [place], [place] [address]   Sodium 04/26/2022 140  135 - 145 mmol/L Final   Potassium 04/26/2022 4.5  3.5 - 5.1 mmol/L Final   Chloride 04/26/2022 105  98 - 111 mmol/L Final   CO2 04/26/2022 28  22 - 32 mmol/L Final   Glucose, Bld 04/26/2022 101 (H)  70 - 99 mg/dL Final   Glucose reference range applies only to samples taken after fasting for at least 8 hours.   BUN 04/26/2022 12  8 - 23 mg/dL Final   Creatinine, Ser 04/26/2022 1.04  0.61 - 1.24 mg/dL Final   Calcium 30/86/5784 8.8 (L)  8.9 - 10.3 mg/dL Final   GFR, Estimated 04/26/2022 >60  >60 mL/min Final   Comment: (NOTE) Calculated using the CKD-EPI Creatinine Equation (2021)    Anion gap 04/26/2022 7  5 - 15 Final   Performed at [facility], [address], [place], [place] [address]   WBC 04/27/2022 3.8 (L)  4.0 - 10.5 K/uL Final   RBC 04/27/2022 3.59 (L)  4.22 - 5.81 MIL/uL Final   Hemoglobin 04/27/2022 8.2 (L)  13.0 - 17.0 g/dL Final   Comment: Reticulocyte Hemoglobin  testing may be clinically indicated, consider ordering this additional test [id number]    HCT 04/27/2022 27.1 (L)  39.0 - 52.0 % Final   MCV 04/27/2022 75.5 (L)  80.0 - 100.0 fL Final   MCH 04/27/2022 22.8 (L)  26.0 - 34.0 pg Final   MCHC 04/27/2022 30.3  30.0 - 36.0 g/dL Final   RDW 40/07/2724 20.1 (H)  11.5 - 15.5 % Final   Platelets 04/27/2022 114 (L)  150 - 400 K/uL Final   REPEATED TO VERIFY   nRBC 04/27/2022 0.0  0.0 - 0.2 % Final   Performed at [facility], [address] [place], [place] [address]   Sodium 04/27/2022 139  135 - 145 mmol/L Final   Potassium 04/27/2022 4.2  3.5 - 5.1 mmol/L Final   Chloride 04/27/2022 108  98 - 111 mmol/L Final   CO2 04/27/2022 25  22 - 32 mmol/L Final   Glucose, Bld 04/27/2022 117 (H)  70 - 99 mg/dL Final   Glucose reference range applies only to samples taken after fasting for at least 8 hours.   BUN 04/27/2022 9  8 - 23 mg/dL Final   Creatinine, Ser 04/27/2022 1.16  0.61 - 1.24 mg/dL Final   Calcium 03/47/4259 8.7 (L)  8.9 - 10.3 mg/dL Final   GFR, Estimated 04/27/2022 >60  >60 mL/min Final  Comment: (NOTE) Calculated using the CKD-EPI Creatinine Equation (2021)    Anion gap 04/27/2022 6  5 - 15 Final   Performed at [facility], [address], [place], [place] [address]   WBC 04/28/2022 4.5  4.0 - 10.5 K/uL Final   RBC 04/28/2022 3.39 (L)  4.22 - 5.81 MIL/uL Final   Hemoglobin 04/28/2022 7.7 (L)  13.0 - 17.0 g/dL Final   Comment: Reticulocyte Hemoglobin testing may be clinically indicated, consider ordering this additional test [id number]    HCT 04/28/2022 25.4 (L)  39.0 - 52.0 % Final   MCV 04/28/2022 74.9 (L)  80.0 - 100.0 fL Final   MCH 04/28/2022 22.7 (L)  26.0 - 34.0 pg Final   MCHC 04/28/2022 30.3  30.0 - 36.0 g/dL Final   RDW 40/07/2724 19.9 (H)  11.5 - 15.5 % Final   Platelets 04/28/2022 125 (L)  150 - 400 K/uL Final   REPEATED TO VERIFY   nRBC 04/28/2022 0.0  0.0 - 0.2 % Final   Performed at [facility], [address] [place], [place] [address]   WBC 04/29/2022 4.3  4.0 - 10.5 K/uL Final   RBC 04/29/2022 3.39 (L)  4.22 - 5.81 MIL/uL Final   Hemoglobin 04/29/2022 7.8 (L)  13.0 - 17.0 g/dL Final   Comment: Reticulocyte Hemoglobin testing may be clinically indicated, consider ordering this additional test [id number]    HCT 04/29/2022 25.1 (L)  39.0 - 52.0 % Final   MCV 04/29/2022 74.0 (L)  80.0 - 100.0 fL Final   MCH 04/29/2022 23.0 (L)  26.0 - 34.0 pg Final   MCHC 04/29/2022 31.1  30.0 - 36.0 g/dL Final   RDW 56/38/7564 19.9 (H)  11.5 - 15.5 % Final   Platelets 04/29/2022 125 (L)  150 - 400 K/uL Final   REPEATED TO VERIFY   nRBC 04/29/2022 0.0  0.0 - 0.2 % Final   Performed at [facility], [address], [place], [place] [address]   WBC 05/01/2022 3.6 (L)  4.0 - 10.5 K/uL Final   RBC 05/01/2022 3.13 (L)  4.22 - 5.81 MIL/uL Final   Hemoglobin 05/01/2022 7.1 (L)  13.0 - 17.0 g/dL Final   Comment: Reticulocyte Hemoglobin testing may be clinically indicated, consider ordering this additional test [id number]    HCT 05/01/2022 23.0 (L)  39.0 - 52.0 % Final   MCV 05/01/2022 73.5 (L)  80.0 - 100.0 fL Final   MCH 05/01/2022 22.7 (L)  26.0 - 34.0 pg Final   MCHC 05/01/2022 30.9  30.0 - 36.0 g/dL Final   RDW 63/10/6008 19.8 (H)  11.5 - 15.5 % Final   Platelets 05/01/2022 149 (L)  150 - 400 K/uL Final   REPEATED TO VERIFY   nRBC 05/01/2022 0.0  0.0 - 0.2 % Final   Performed at [facility], [address] [place], [place] [address]   Sodium 05/01/2022 137  135 - 145 mmol/L Final   Potassium 05/01/2022 4.0  3.5 - 5.1 mmol/L Final   Chloride 05/01/2022 109  98 - 111 mmol/L Final   CO2 05/01/2022 23  22 - 32 mmol/L Final   Glucose, Bld 05/01/2022 98  70 - 99 mg/dL Final   Glucose reference range applies only to samples taken after fasting for at least 8 hours.   BUN 05/01/2022 14  8 - 23 mg/dL Final   Creatinine, Ser 05/01/2022 0.83  0.61 - 1.24 mg/dL Final   Calcium  05/01/2022 8.4 (L)  8.9 - 10.3 mg/dL Final   GFR, Estimated 05/01/2022 >60  >60 mL/min Final   Comment: (NOTE) Calculated using the CKD-EPI Creatinine Equation (2021)    Anion gap 05/01/2022 5  5 - 15 Final   Performed at [facility], [address], [place], [place] [address]   WBC 05/02/2022 7.8  4.0 - 10.5 K/uL Final   RBC 05/02/2022 3.23 (L)  4.22 - 5.81 MIL/uL Final   Hemoglobin 05/02/2022 7.2 (L)  13.0 - 17.0 g/dL Final   Comment: Reticulocyte Hemoglobin testing may be clinically indicated, consider ordering this additional test [id number]    HCT 05/02/2022 23.6 (L)  39.0 - 52.0 % Final   MCV 05/02/2022 73.1 (L)  80.0 - 100.0 fL Final   MCH 05/02/2022 22.3 (L)  26.0 - 34.0 pg Final   MCHC 05/02/2022 30.5  30.0 - 36.0 g/dL Final   RDW 11/91/4782 19.5 (H)  11.5 - 15.5 % Final   Platelets 05/02/2022 153  150 - 400 K/uL Final   REPEATED TO VERIFY   nRBC 05/02/2022 0.0  0.0 - 0.2 % Final   Performed at [facility], [address], [place], [place] [address]  Admission on 03/31/2022, Discharged on 04/02/2022  Component Date Value Ref Range Status   Sodium 03/31/2022 140  135 - 145 mmol/L Final   Potassium 03/31/2022 3.6  3.5 - 5.1 mmol/L Final   Chloride 03/31/2022 108  98 - 111 mmol/L Final   CO2 03/31/2022 19 (L)  22 - 32 mmol/L Final   Glucose, Bld 03/31/2022 84  70 - 99 mg/dL Final   Glucose reference range applies only to samples taken after fasting for at least 8 hours.   BUN 03/31/2022 9  8 - 23 mg/dL Final   Creatinine, Ser 03/31/2022 0.86  0.61 - 1.24 mg/dL Final   Calcium 30/86/5784 8.6 (L)  8.9 - 10.3 mg/dL Final   Total Protein 69/62/9528 7.2  6.5 - 8.1 g/dL Final   Albumin 41/32/4401 3.5  3.5 - 5.0 g/dL Final   AST 02/72/5366 56 (H)  15 - 41 U/L Final   ALT 03/31/2022 35  0 - 44 U/L Final   Alkaline Phosphatase 03/31/2022 61  38 - 126 U/L Final   Total Bilirubin 03/31/2022 2.0 (H)  0.3 - 1.2 mg/dL Final   GFR, Estimated 03/31/2022 >60  >60 mL/min  Final   Comment: (NOTE) Calculated using the CKD-EPI Creatinine Equation (2021)    Anion gap 03/31/2022 13  5 - 15 Final   Performed at [facility], [address] [place], [place] [address]   Alcohol, Ethyl (B) 03/31/2022 203 (H)  <10 mg/dL Final   Comment: (NOTE) Lowest detectable limit for serum alcohol is 10 mg/dL.  For medical purposes only. Performed at [facility] Lab, [address], [place], [place] [address]    Salicylate Lvl 03/31/2022 <7.0 (L)  7.0 - 30.0 mg/dL Final   Performed at [facility], [address] [place], [place] [address]   Acetaminophen (Tylenol), Serum 03/31/2022 <10 (L)  10 - 30 ug/mL Final   Comment: (NOTE) Therapeutic concentrations vary significantly. A range of 10-30 ug/mL  may be an effective concentration for many patients. However, some  are best treated at concentrations outside of this range. Acetaminophen concentrations >150 ug/mL at 4 hours after ingestion  and >50 ug/mL at 12 hours after ingestion are often associated with  toxic reactions.  Performed  at [facility] Lab, [address], [place], [place] [address]    WBC 03/31/2022 4.4  4.0 - 10.5 K/uL Final   RBC 03/31/2022 5.10  4.22 - 5.81 MIL/uL Final   Hemoglobin 03/31/2022 11.8 (L)  13.0 - 17.0 g/dL Final   HCT 91/47/8295 38.5 (L)  39.0 - 52.0 % Final   MCV 03/31/2022 75.5 (L)  80.0 - 100.0 fL Final   MCH 03/31/2022 23.1 (L)  26.0 - 34.0 pg Final   MCHC 03/31/2022 30.6  30.0 - 36.0 g/dL Final   RDW 62/13/0865 19.4 (H)  11.5 - 15.5 % Final   Platelets 03/31/2022 102 (L)  150 - 400 K/uL Final   Comment: Immature Platelet Fraction may be clinically indicated, consider ordering this additional test [id number] REPEATED TO VERIFY PLATELET COUNT CONFIRMED BY SMEAR    nRBC 03/31/2022 0.0  0.0 - 0.2 % Final   Performed at [facility], [address], [place], [place] [address]   Opiates 03/31/2022 NONE DETECTED  NONE DETECTED Final   Cocaine 03/31/2022 NONE  DETECTED  NONE DETECTED Final   Benzodiazepines 03/31/2022 NONE DETECTED  NONE DETECTED Final   Amphetamines 03/31/2022 NONE DETECTED  NONE DETECTED Final   Tetrahydrocannabinol 03/31/2022 NONE DETECTED  NONE DETECTED Final   Barbiturates 03/31/2022 NONE DETECTED  NONE DETECTED Final   Comment: (NOTE) DRUG SCREEN FOR MEDICAL PURPOSES ONLY.  IF CONFIRMATION IS NEEDED FOR ANY PURPOSE, NOTIFY LAB WITHIN 5 DAYS.  LOWEST DETECTABLE LIMITS FOR URINE DRUG SCREEN Drug Class                     Cutoff (ng/mL) Amphetamine and metabolites    1000 Barbiturate and metabolites    200 Benzodiazepine                 200 Tricyclics and metabolites     300 Opiates and metabolites        300 Cocaine and metabolites        300 THC                            50 Performed at [facility], [address] [place], [place] [address]    SARS Coronavirus 2 by RT PCR 03/31/2022 NEGATIVE  NEGATIVE Final   Comment: (NOTE) SARS-CoV-2 target nucleic acids are NOT DETECTED.  The SARS-CoV-2 RNA is generally detectable in upper and lower respiratory specimens during the acute phase of infection. The lowest concentration of SARS-CoV-2 viral copies this assay can detect is 250 copies / mL. A negative result does not preclude SARS-CoV-2 infection and should not be used as the sole basis for treatment or other patient management decisions.  A negative result may occur with improper specimen collection / handling, submission of specimen other than nasopharyngeal swab, presence of viral mutation(s) within the areas targeted by this assay, and inadequate number of viral copies (<250 copies / mL). A negative result must be combined with clinical observations, patient history, and epidemiological information.  Fact Sheet for Patients:   RoadLapTop.co.za  Fact Sheet for Healthcare Providers: http://kim-miller.com/  This test is not yet approved or                            cleared by the [place] FDA and has been authorized for detection and/or diagnosis of SARS-CoV-2 by FDA under an Emergency Use Authorization (  EUA).  This EUA will remain in effect (meaning this test can be used) for the duration of the COVID-19 declaration under Section 564(b)(1) of the Act, 21 U.S.C. section 360bbb-3(b)(1), unless the authorization is terminated or revoked sooner.  Performed at [facility], [address] [place], [place] [address]   Admission on 01/22/2022, Discharged on 01/22/2022  Component Date Value Ref Range Status   Lipase 01/22/2022 44  11 - 51 U/L Final   Performed at [facility], [address] [place], [place] [address]   Sodium 01/22/2022 138  135 - 145 mmol/L Final   Potassium 01/22/2022 3.4 (L)  3.5 - 5.1 mmol/L Final   Chloride 01/22/2022 105  98 - 111 mmol/L Final   CO2 01/22/2022 24  22 - 32 mmol/L Final   Glucose, Bld 01/22/2022 90  70 - 99 mg/dL Final   Glucose reference range applies only to samples taken after fasting for at least 8 hours.   BUN 01/22/2022 10  8 - 23 mg/dL Final   Creatinine, Ser 01/22/2022 0.87  0.61 - 1.24 mg/dL Final   Calcium 57/84/696204/05/2022 8.9  8.9 - 10.3 mg/dL Final   Total Protein 95/28/413204/05/2022 7.9  6.5 - 8.1 g/dL Final   Albumin 44/01/027204/05/2022 3.7  3.5 - 5.0 g/dL Final   AST 53/66/440304/05/2022 43 (H)  15 - 41 U/L Final   ALT 01/22/2022 31  0 - 44 U/L Final   Alkaline Phosphatase 01/22/2022 74  38 - 126 U/L Final   Total Bilirubin 01/22/2022 2.0 (H)  0.3 - 1.2 mg/dL Final   GFR, Estimated 01/22/2022 >60  >60 mL/min Final   Comment: (NOTE) Calculated using the CKD-EPI Creatinine Equation (2021)    Anion gap 01/22/2022 9  5 - 15 Final   Performed at [facility], [address] [place], [place] [address]   WBC 01/22/2022 4.7  4.0 - 10.5 K/uL Final   RBC 01/22/2022 5.84 (H)  4.22 - 5.81 MIL/uL Final   Hemoglobin 01/22/2022 13.1  13.0 - 17.0 g/dL Final   HCT 95/63/875604/05/2022 43.9   39.0 - 52.0 % Final   MCV 01/22/2022 75.2 (L)  80.0 - 100.0 fL Final   MCH 01/22/2022 22.4 (L)  26.0 - 34.0 pg Final   MCHC 01/22/2022 29.8 (L)  30.0 - 36.0 g/dL Final   RDW 43/32/951804/05/2022 19.7 (H)  11.5 - 15.5 % Final   Platelets 01/22/2022 128 (L)  150 - 400 K/uL Final   nRBC 01/22/2022 0.0  0.0 - 0.2 % Final   Performed at [facility], [address] [place], [place] [address]   Color, Urine 01/22/2022 STRAW (A)  YELLOW Final   APPearance 01/22/2022 CLEAR  CLEAR Final   Specific Gravity, Urine 01/22/2022 1.002 (L)  1.005 - 1.030 Final   pH 01/22/2022 5.0  5.0 - 8.0 Final   Glucose, UA 01/22/2022 NEGATIVE  NEGATIVE mg/dL Final   Hgb urine dipstick 01/22/2022 NEGATIVE  NEGATIVE Final   Bilirubin Urine 01/22/2022 NEGATIVE  NEGATIVE Final   Ketones, ur 01/22/2022 NEGATIVE  NEGATIVE mg/dL Final   Protein, ur 06/30/160104/05/2022 NEGATIVE  NEGATIVE mg/dL Final   Nitrite 09/32/355704/05/2022 NEGATIVE  NEGATIVE Final   Leukocytes,Ua 01/22/2022 NEGATIVE  NEGATIVE Final   Performed at [facility], [address] [place], [place] [address]   Opiates 01/22/2022 NONE DETECTED  NONE DETECTED Final   Cocaine 01/22/2022 NONE DETECTED  NONE DETECTED Final   Benzodiazepines 01/22/2022 NONE DETECTED  NONE DETECTED Final   Amphetamines 01/22/2022 NONE DETECTED  NONE DETECTED Final   Tetrahydrocannabinol 01/22/2022 NONE DETECTED  NONE DETECTED Final   Barbiturates 01/22/2022 NONE DETECTED  NONE DETECTED Final   Comment: (NOTE) DRUG SCREEN FOR MEDICAL PURPOSES ONLY.  IF CONFIRMATION IS NEEDED FOR ANY PURPOSE, NOTIFY LAB WITHIN 5 DAYS.  LOWEST DETECTABLE LIMITS FOR URINE DRUG SCREEN Drug Class                     Cutoff (ng/mL) Amphetamine and metabolites    1000 Barbiturate and metabolites    200 Benzodiazepine                 200 Tricyclics and metabolites     300 Opiates and metabolites        300 Cocaine and metabolites        300 THC                             50 Performed at [facility], [address] [place], [place] [address]    Alcohol, Ethyl (B) 01/22/2022 134 (H)  <10 mg/dL Final   Comment: (NOTE) Lowest detectable limit for serum alcohol is 10 mg/dL.  For medical purposes only. Performed at [facility], [address] [place], [place] [address]    Specimen Description 01/22/2022    Final                   Value:URINE, CLEAN CATCH Performed at [facility], [address] [place], [place] [address]    Special Requests 01/22/2022    Final                   Value:NONE Performed at [facility], [address], [place], [place] [address]    Culture 01/22/2022    Final                   Value:NO GROWTH Performed at [facility], [address], [place], [place] [address]    Report Status 01/22/2022 01/23/2022 FINAL   Final   Ammonia 01/22/2022 18  9 - 35 umol/L Final   Performed at [facility], [address], [place], [place] [address]   Acetaminophen (Tylenol), Serum 01/22/2022 <10 (L)  10 - 30 ug/mL Final   Comment: (NOTE) Therapeutic concentrations vary significantly. A range of 10-30 ug/mL  may be an effective concentration for many patients. However, some  are best treated at concentrations outside of this range. Acetaminophen concentrations >150 ug/mL at 4 hours after ingestion  and >50 ug/mL at 12 hours after ingestion are often associated with  toxic reactions.  Performed at [facility], [address] [place], [place] [address]    Salicylate Lvl 01/22/2022 <7.0 (L)  7.0 - 30.0 mg/dL Final   Performed at [facility], [address], [place], [place] [address]  Admission on 01/10/2022, Discharged on 01/10/2022  Component Date Value Ref Range Status   Sodium 01/10/2022 131 (L)  135 - 145 mmol/L Final   Potassium 01/10/2022 4.0  3.5 - 5.1 mmol/L Final   Chloride 01/10/2022  98  98 - 111 mmol/L Final   CO2 01/10/2022 23  22 - 32 mmol/L Final   Glucose, Bld 01/10/2022 84  70 - 99 mg/dL Final   Glucose reference range applies only to samples taken  after fasting for at least 8 hours.   BUN 01/10/2022 8  8 - 23 mg/dL Final   Creatinine, Ser 01/10/2022 0.76  0.61 - 1.24 mg/dL Final   Calcium 83/66/2947 9.1  8.9 - 10.3 mg/dL Final   Total Protein 65/46/5035 7.8  6.5 - 8.1 g/dL Final   Albumin 46/56/8127 3.8  3.5 - 5.0 g/dL Final   AST 51/70/0174 55 (H)  15 - 41 U/L Final   ALT 01/10/2022 36  0 - 44 U/L Final   Alkaline Phosphatase 01/10/2022 76  38 - 126 U/L Final   Total Bilirubin 01/10/2022 3.3 (H)  0.3 - 1.2 mg/dL Final   GFR, Estimated 01/10/2022 >60  >60 mL/min Final   Comment: (NOTE) Calculated using the CKD-EPI Creatinine Equation (2021)    Anion gap 01/10/2022 10  5 - 15 Final   Performed at [facility], [address] [place], [place] [address]   WBC 01/10/2022 5.7  4.0 - 10.5 K/uL Final   RBC 01/10/2022 5.16  4.22 - 5.81 MIL/uL Final   Hemoglobin 01/10/2022 11.8 (L)  13.0 - 17.0 g/dL Final   HCT 75/91/6384 39.0  39.0 - 52.0 % Final   MCV 01/10/2022 75.6 (L)  80.0 - 100.0 fL Final   MCH 01/10/2022 22.9 (L)  26.0 - 34.0 pg Final   MCHC 01/10/2022 30.3  30.0 - 36.0 g/dL Final   RDW 66/59/9357 18.4 (H)  11.5 - 15.5 % Final   Platelets 01/10/2022 138 (L)  150 - 400 K/uL Final   REPEATED TO VERIFY   nRBC 01/10/2022 0.0  0.0 - 0.2 % Final   Neutrophils Relative % 01/10/2022 61  % Final   Neutro Abs 01/10/2022 3.5  1.7 - 7.7 K/uL Final   Lymphocytes Relative 01/10/2022 27  % Final   Lymphs Abs 01/10/2022 1.5  0.7 - 4.0 K/uL Final   Monocytes Relative 01/10/2022 10  % Final   Monocytes Absolute 01/10/2022 0.6  0.1 - 1.0 K/uL Final   Eosinophils Relative 01/10/2022 1  % Final   Eosinophils Absolute 01/10/2022 0.0  0.0 - 0.5 K/uL Final   Basophils Relative 01/10/2022 1  % Final   Basophils Absolute 01/10/2022 0.1  0.0 - 0.1 K/uL Final   Immature  Granulocytes 01/10/2022 0  % Final   Abs Immature Granulocytes 01/10/2022 0.02  0.00 - 0.07 K/uL Final   Performed at [facility], [address], [place], [place] [address]   Opiates 01/10/2022 NONE DETECTED  NONE DETECTED Final   Cocaine 01/10/2022 NONE DETECTED  NONE DETECTED Final   Benzodiazepines 01/10/2022 NONE DETECTED  NONE DETECTED Final   Amphetamines 01/10/2022 NONE DETECTED  NONE DETECTED Final   Tetrahydrocannabinol 01/10/2022 NONE DETECTED  NONE DETECTED Final   Barbiturates 01/10/2022 NONE DETECTED  NONE DETECTED Final   Comment: (NOTE) DRUG SCREEN FOR MEDICAL PURPOSES ONLY.  IF CONFIRMATION IS NEEDED FOR ANY PURPOSE, NOTIFY LAB WITHIN 5 DAYS.  LOWEST DETECTABLE LIMITS FOR URINE DRUG SCREEN Drug Class                     Cutoff (ng/mL) Amphetamine and metabolites    1000 Barbiturate and metabolites    200 Benzodiazepine                 200 Tricyclics and metabolites     300 Opiates and metabolites        300 Cocaine and metabolites  300 THC                            50 Performed at [facility], [address], [place], [place] [address]    Color, Urine 01/10/2022 YELLOW  YELLOW Final   APPearance 01/10/2022 CLEAR  CLEAR Final   Specific Gravity, Urine 01/10/2022 1.003 (L)  1.005 - 1.030 Final   pH 01/10/2022 5.0  5.0 - 8.0 Final   Glucose, UA 01/10/2022 NEGATIVE  NEGATIVE mg/dL Final   Hgb urine dipstick 01/10/2022 SMALL (A)  NEGATIVE Final   Bilirubin Urine 01/10/2022 NEGATIVE  NEGATIVE Final   Ketones, ur 01/10/2022 5 (A)  NEGATIVE mg/dL Final   Protein, ur 60/45/4098 NEGATIVE  NEGATIVE mg/dL Final   Nitrite 11/91/4782 NEGATIVE  NEGATIVE Final   Leukocytes,Ua 01/10/2022 NEGATIVE  NEGATIVE Final   WBC, UA 01/10/2022 0-5  0 - 5 WBC/hpf Final   Bacteria, UA 01/10/2022 NONE SEEN  NONE SEEN Final   Squamous Epithelial / LPF 01/10/2022 0-5  0 - 5 Final   Performed at [facility], [address], [place], [place] [address]   Lipase  01/10/2022 37  11 - 51 U/L Final   Performed at [facility], [address], [place], [place] [address]   Total CK 01/10/2022 163  49 - 397 U/L Final   Performed at [facility], [address], [place], [place] [address]  There may be more visits with results that are not included.    Allergies: Carrot [daucus carota]  PTA Medications: (Not in a hospital admission)   Long Term Goals: Improvement in symptoms so as ready for discharge  Short Term Goals: Patient will attend at least of 50% of the groups daily., Pt will complete the PHQ9 on admission, day 3 and discharge., and Patient will take medications as prescribed daily.  Medical Decision Making  Patient admitted to the [facility] from obs for alcohol detox and mood stabilization.   Medications:  Continue Norvasc 5 mg po daily for HTN Increase Seroquel  to 150 mg po QHS. Patient last prescribed was 200 mg QHS Add ativan 1 mg po taper for AUD  Labs pending. Encouraged patient and nurse to obtain labs today.     Recommendations  Based on my evaluation the patient does not appear to have an emergency medical condition.  Kenniel Bergsma L, NP 06/11/22  10:22 AM

## 2022-06-12 ENCOUNTER — Other Ambulatory Visit: Payer: Self-pay

## 2022-06-12 DIAGNOSIS — F1020 Alcohol dependence, uncomplicated: Secondary | ICD-10-CM | POA: Diagnosis not present

## 2022-06-12 MED ORDER — HYDROXYZINE HCL 25 MG PO TABS
25.0000 mg | ORAL_TABLET | Freq: Three times a day (TID) | ORAL | Status: DC
  Administered 2022-06-12 – 2022-06-15 (×10): 25 mg via ORAL
  Filled 2022-06-12 (×5): qty 1
  Filled 2022-06-12: qty 21
  Filled 2022-06-12 (×5): qty 1

## 2022-06-12 MED ORDER — POLYVINYL ALCOHOL 1.4 % OP SOLN
1.0000 [drp] | OPHTHALMIC | Status: DC | PRN
  Administered 2022-06-12 – 2022-06-14 (×3): 1 [drp] via OPHTHALMIC

## 2022-06-12 NOTE — ED Notes (Signed)
Pt. Requested a cup of coffee

## 2022-06-12 NOTE — Progress Notes (Signed)
Patient is resting in bed at this time without complaint or distress.  No evidence of withdrawal.  Will monitor and provide safe environment.

## 2022-06-12 NOTE — ED Notes (Signed)
Pt sleeping@this time. Breathing even and unlabored. Will continue to monitor for safety 

## 2022-06-12 NOTE — ED Provider Notes (Cosign Needed Addendum)
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  IncredibleEmployment.be  This test is no                          t yet approved or cleared by the [place] FDA and  has been authorized for detection and/or diagnosis of SARS-CoV-2 by FDA under an Emergency Use Authorization (EUA). This EUA will remain  in effect (meaning this test can be used) for the duration of the COVID-19 declaration under Section 564(b)(1) of the Act, 21 U.S.C.section  360bbb-3(b)(1), unless the authorization  is terminated  or revoked sooner.       Influenza A by PCR 05/24/2022 NEGATIVE  NEGATIVE Final   Influenza B by PCR 05/24/2022 NEGATIVE  NEGATIVE Final   Comment: (NOTE) The Xpert Xpress SARS-CoV-2/FLU/RSV plus assay is intended as an aid in the diagnosis of influenza from Nasopharyngeal swab specimens and should not be used as a sole basis for treatment. Nasal washings and aspirates are unacceptable for Xpert Xpress SARS-CoV-2/FLU/RSV testing.  Fact Sheet for Patients: EntrepreneurPulse.com.au  Fact Sheet for Healthcare Providers: IncredibleEmployment.be  This test is not yet approved or cleared by the [place] FDA and has been authorized for detection and/or diagnosis of SARS-CoV-2 by FDA under an Emergency Use Authorization (EUA). This EUA will remain in effect (meaning this test can be used) for the duration of the COVID-19 declaration under Section 564(b)(1) of the Act, 21 U.S.C. section 360bbb-3(b)(1), unless the authorization is terminated or revoked.  Performed at [facility], [place] [address], [place], [place] [address]    WBC 05/24/2022 5.3  4.0 - 10.5 K/uL Final   RBC 05/24/2022 3.98 (L)  4.22 - 5.81 MIL/uL Final   Hemoglobin 05/24/2022 8.4 (L)  13.0 - 17.0 g/dL Final   Comment: Reticulocyte Hemoglobin testing may be clinically indicated, consider ordering this additional test [id number]    HCT 05/24/2022 27.4 (L)  39.0 - 52.0 % Final   MCV 05/24/2022 68.8 (L)  80.0 - 100.0 fL Final   MCH 05/24/2022 21.1 (L)  26.0 - 34.0 pg Final   MCHC 05/24/2022 30.7  30.0 - 36.0 g/dL Final   RDW 05/24/2022 19.3 (H)  11.5 - 15.5 % Final   Platelets 05/24/2022 149 (L)  150 - 400 K/uL Final   REPEATED TO VERIFY   nRBC 05/24/2022 0.0  0.0 - 0.2 % Final   Neutrophils Relative % 05/24/2022 39  % Final   Neutro Abs 05/24/2022 2.0  1.7 - 7.7 K/uL Final   Lymphocytes Relative 05/24/2022 45  % Final   Lymphs Abs 05/24/2022 2.4  0.7 - 4.0  K/uL Final   Monocytes Relative 05/24/2022 11  % Final   Monocytes Absolute 05/24/2022 0.6  0.1 - 1.0 K/uL Final   Eosinophils Relative 05/24/2022 3  % Final   Eosinophils Absolute 05/24/2022 0.2  0.0 - 0.5 K/uL Final   Basophils Relative 05/24/2022 2  % Final   Basophils Absolute 05/24/2022 0.1  0.0 - 0.1 K/uL Final   Immature Granulocytes 05/24/2022 0  % Final   Abs Immature Granulocytes 05/24/2022 0.01  0.00 - 0.07 K/uL Final   Performed at [facility], [place] [address], [place], [place] [address]   Sodium 05/24/2022 139  135 - 145 mmol/L Final   Potassium 05/24/2022 3.9  3.5 - 5.1 mmol/L Final   Chloride 05/24/2022 108  98 - 111 mmol/L Final   CO2 05/24/2022 21 (L)  22 - 32 mmol/L Final   Glucose, Bld 05/24/2022 100 (H)  70 - 99 mg/dL Final   Glucose reference range applies only to samples taken after fasting for at least 8 hours.   BUN 05/24/2022 12  8 - 23 mg/dL Final   Creatinine, Ser 05/24/2022 1.04  0.61 - 1.24 mg/dL Final   Calcium 05/24/2022 9.0  8.9 - 10.3 mg/dL Final   Total Protein 05/24/2022 7.4  6.5 - 8.1 g/dL Final   Albumin 05/24/2022 3.6  3.5 - 5.0 g/dL Final   AST 05/24/2022 36  15 - 41 U/L Final   ALT 05/24/2022 26  0 - 44 U/L Final   Alkaline Phosphatase 05/24/2022 77  38 - 126 U/L Final   Total Bilirubin 05/24/2022 1.2  0.3 - 1.2 mg/dL Final   GFR, Estimated 05/24/2022 >60  >60 mL/min Final   Comment: (NOTE) Calculated using the CKD-EPI Creatinine Equation (2021)    Anion gap 05/24/2022 10  5 - 15 Final   Performed at [facility], [place] [address] [place], [place] [address]   Hgb A1c MFr Bld 05/24/2022 5.2  4.8 - 5.6 % Final   Comment: (NOTE) Pre diabetes:          5.7%-6.4%  Diabetes:              >6.4%  Glycemic control for   <7.0% adults with diabetes    Mean Plasma Glucose 05/24/2022 102.54  mg/dL Final   Performed at [facility], [place] [address] [place], [place] [address]   Alcohol, Ethyl (B) 05/24/2022 99 (H)  <10 mg/dL Final    Comment: (NOTE) Lowest detectable limit for serum alcohol is 10 mg/dL.  For medical purposes only. Performed at [facility], [place] [address] [place], [place] [address]    Hepatitis B Surface Ag 05/24/2022 NON REACTIVE  NON REACTIVE Final   HCV Ab 05/24/2022 Reactive (A)  NON REACTIVE Final   Comment: (NOTE) The CDC recommends that a Reactive HCV antibody result be followed up  with a HCV Nucleic Acid Amplification test.     Hep A IgM 05/24/2022 NON REACTIVE  NON REACTIVE Final   Hep B C IgM 05/24/2022 NON REACTIVE  NON REACTIVE Final   Performed at [facility], [place] [address] [place], [place] [address]   POC Amphetamine UR 05/25/2022 None Detected  NONE DETECTED (Cut Off Level 1000 ng/mL) Final   POC Secobarbital (BAR) 05/25/2022 None Detected  NONE DETECTED (Cut Off Level 300 ng/mL) Final   POC Buprenorphine (BUP) 05/25/2022 None Detected  NONE DETECTED (Cut Off Level 10 ng/mL) Final   POC Oxazepam (BZO) 05/25/2022 Positive (A)  NONE DETECTED (Cut Off Level 300 ng/mL) Final   POC Cocaine UR 05/25/2022 Positive (A)  NONE DETECTED (Cut Off Level 300 ng/mL) Final   POC Methamphetamine UR 05/25/2022 None Detected  NONE DETECTED (Cut Off Level 1000 ng/mL) Final   POC Morphine 05/25/2022 None Detected  NONE DETECTED (Cut Off Level 300 ng/mL) Final   POC Methadone UR 05/25/2022 None Detected  NONE DETECTED (Cut Off Level 300 ng/mL) Final   POC Oxycodone UR 05/25/2022 None Detected  NONE DETECTED (Cut Off Level 100 ng/mL) Final   POC Marijuana UR 05/25/2022 None Detected  NONE DETECTED (Cut Off Level 50 ng/mL) Final   SARSCOV2ONAVIRUS 2 AG 05/24/2022 NEGATIVE  NEGATIVE Final   Comment: (NOTE) SARS-CoV-2 antigen NOT DETECTED.   Negative results are presumptive.  Negative results do not preclude SARS-CoV-2 infection and should not be used as the sole basis for treatment or other patient management decisions, including infection  control decisions, particularly in the presence  of clinical signs and  symptoms consistent with COVID-19, or in those who have been in contact with the virus.  Negative results must be combined with clinical observations, patient history, and epidemiological information. The expected result is Negative.  Fact Sheet for Patients: HandmadeRecipes.com.cy  Fact Sheet for Healthcare Providers: FuneralLife.at  This test is not yet approved or cleared by the [place] and  has been authorized for detection and/or diagnosis of SARS-CoV-2 by FDA under an Emergency Use Authorization (EUA).  This EUA will remain in effect (meaning this test can be used) for the duration of  the COV                          ID-19 declaration under Section 564(b)(1) of the Act, 21 U.S.C. section 360bbb-3(b)(1), unless the authorization is terminated or revoked sooner.     Cholesterol 05/24/2022 141  0 - 200 mg/dL Final   Triglycerides 05/24/2022 94  <150 mg/dL Final   HDL 05/24/2022 57  >40 mg/dL Final   Total CHOL/HDL Ratio 05/24/2022 2.5  RATIO Final   VLDL 05/24/2022 19  0 - 40 mg/dL Final   LDL Cholesterol 05/24/2022 NOT CALCULATED  0 - 99 mg/dL Final   Performed at [place] [address] [place], [place] [address]   TSH 05/24/2022 2.834  0.350 - 4.500 uIU/mL Final   Comment: Performed by a 3rd Generation assay with a functional sensitivity of <=0.01 uIU/mL. Performed at [facility], [place] [address], [place], [place] [address]   Admission on 05/10/2022, Discharged on 05/11/2022  Component Date Value Ref Range Status   SARS Coronavirus 2 by RT PCR 05/10/2022 NEGATIVE  NEGATIVE Final   Comment: (NOTE) SARS-CoV-2 target nucleic acids are NOT DETECTED.  The SARS-CoV-2 RNA is generally detectable in upper respiratory specimens during the acute phase of infection. The lowest concentration of SARS-CoV-2 viral copies this assay can detect is 138 copies/mL. A negative result does not  preclude SARS-Cov-2 infection and should not be used as the sole basis for treatment or other patient management decisions. A negative result may occur with  improper specimen collection/handling, submission of specimen other than nasopharyngeal swab, presence of viral mutation(s) within the areas targeted by this assay, and inadequate number of viral copies(<138 copies/mL). A negative result must be combined with clinical observations, patient history, and epidemiological information. The expected result is Negative.  Fact Sheet for Patients:  EntrepreneurPulse.com.au  Fact Sheet for Healthcare Providers:  IncredibleEmployment.be  This test is no                          t yet approved or cleared by the [place] FDA and  has been authorized for detection and/or diagnosis of SARS-CoV-2 by FDA under an Emergency Use Authorization (EUA). This EUA will remain  in effect (meaning this test can be used) for the duration of the COVID-19 declaration under Section 564(b)(1) of the Act, 21 U.S.C.section 360bbb-3(b)(1), unless the authorization is terminated  or revoked sooner.       Influenza A by PCR 05/10/2022 NEGATIVE  NEGATIVE Final   Influenza B by PCR 05/10/2022 NEGATIVE  NEGATIVE Final   Comment: (NOTE) The Xpert Xpress SARS-CoV-2/FLU/RSV plus assay is intended as an aid in the diagnosis of influenza from Nasopharyngeal swab specimens and should not be used as a sole basis for treatment. Nasal washings and aspirates are unacceptable for Xpert Xpress SARS-CoV-2/FLU/RSV testing.  Fact Sheet for Patients: EntrepreneurPulse.com.au  Fact Sheet for Healthcare Providers: IncredibleEmployment.be  This test is not yet approved or cleared by the [place] FDA and has been authorized for detection and/or diagnosis of SARS-CoV-2 by FDA under an Emergency Use Authorization (EUA). This EUA will remain in  effect (meaning this test can be used) for the duration of the COVID-19 declaration  under Section 564(b)(1) of the Act, 21 U.S.C. section 360bbb-3(b)(1), unless the authorization is terminated or revoked.  Performed at [facility], [place] [address] [place], [place] [address]    Sodium 05/10/2022 137  135 - 145 mmol/L Final   Potassium 05/10/2022 3.7  3.5 - 5.1 mmol/L Final   Chloride 05/10/2022 106  98 - 111 mmol/L Final   CO2 05/10/2022 23  22 - 32 mmol/L Final   Glucose, Bld 05/10/2022 90  70 - 99 mg/dL Final   Glucose reference range applies only to samples taken after fasting for at least 8 hours.   BUN 05/10/2022 13  8 - 23 mg/dL Final   Creatinine, Ser 05/10/2022 1.04  0.61 - 1.24 mg/dL Final   Calcium 05/10/2022 8.8 (L)  8.9 - 10.3 mg/dL Final   Total Protein 05/10/2022 7.8  6.5 - 8.1 g/dL Final   Albumin 05/10/2022 3.6  3.5 - 5.0 g/dL Final   AST 05/10/2022 30  15 - 41 U/L Final   ALT 05/10/2022 21  0 - 44 U/L Final   Alkaline Phosphatase 05/10/2022 62  38 - 126 U/L Final   Total Bilirubin 05/10/2022 0.8  0.3 - 1.2 mg/dL Final   GFR, Estimated 05/10/2022 >60  >60 mL/min Final   Comment: (NOTE) Calculated using the CKD-EPI Creatinine Equation (2021)    Anion gap 05/10/2022 8  5 - 15 Final   Performed at [facility], [place] [address], [place], [place] [address]   Alcohol, Ethyl (B) 05/10/2022 156 (H)  <10 mg/dL Final   Comment: (NOTE) Lowest detectable limit for serum alcohol is 10 mg/dL.  For medical purposes only. Performed at [facility], [place] [address], [place], [place] [address]    Opiates 05/10/2022 POSITIVE (A)  NONE DETECTED Final   Cocaine 05/10/2022 POSITIVE (A)  NONE DETECTED Final   Benzodiazepines 05/10/2022 POSITIVE (A)  NONE DETECTED Final   Amphetamines 05/10/2022 NONE DETECTED  NONE DETECTED Final   Tetrahydrocannabinol 05/10/2022 NONE DETECTED  NONE DETECTED Final   Barbiturates 05/10/2022 NONE  DETECTED  NONE DETECTED Final   Comment: (NOTE) DRUG SCREEN FOR MEDICAL PURPOSES ONLY.  IF CONFIRMATION IS NEEDED FOR ANY PURPOSE, NOTIFY LAB WITHIN 5 DAYS.  LOWEST DETECTABLE LIMITS FOR URINE DRUG SCREEN Drug Class                     Cutoff (ng/mL) Amphetamine and metabolites    1000 Barbiturate and metabolites    200 Benzodiazepine                 [id number] Tricyclics and metabolites     300 Opiates and metabolites        300 Cocaine and metabolites        300 THC                            50 Performed at [facility], [place] [address] [place], [place] [address]    WBC 05/10/2022 4.5  4.0 - 10.5 K/uL Final   RBC 05/10/2022 3.77 (L)  4.22 - 5.81 MIL/uL Final   Hemoglobin 05/10/2022 8.3 (L)  13.0 - 17.0 g/dL Final   Comment: Reticulocyte Hemoglobin testing may be clinically indicated, consider ordering this additional test [id number]    HCT 05/10/2022 27.3 (L)  39.0 - 52.0 % Final   MCV 05/10/2022 72.4 (L)  80.0 - 100.0 fL Final  MCH 05/10/2022 22.0 (L)  26.0 - 34.0 pg Final   MCHC 05/10/2022 30.4  30.0 - 36.0 g/dL Final   RDW 05/10/2022 19.8 (H)  11.5 - 15.5 % Final   Platelets 05/10/2022 162  150 - 400 K/uL Final   nRBC 05/10/2022 0.0  0.0 - 0.2 % Final   Neutrophils Relative % 05/10/2022 55  % Final   Neutro Abs 05/10/2022 2.5  1.7 - 7.7 K/uL Final   Lymphocytes Relative 05/10/2022 32  % Final   Lymphs Abs 05/10/2022 1.4  0.7 - 4.0 K/uL Final   Monocytes Relative 05/10/2022 10  % Final   Monocytes Absolute 05/10/2022 0.4  0.1 - 1.0 K/uL Final   Eosinophils Relative 05/10/2022 2  % Final   Eosinophils Absolute 05/10/2022 0.1  0.0 - 0.5 K/uL Final   Basophils Relative 05/10/2022 1  % Final   Basophils Absolute 05/10/2022 0.1  0.0 - 0.1 K/uL Final   Immature Granulocytes 05/10/2022 0  % Final   Abs Immature Granulocytes 05/10/2022 0.01  0.00 - 0.07 K/uL Final   Performed at [facility], [place] [address] [place], [place] [id number]    Salicylate Lvl 99991111 <7.0 (L)  7.0 - 30.0 mg/dL Final   Performed at [place] [address] [place], [place] [address]   Acetaminophen (Tylenol), Serum 05/10/2022 <10 (L)  10 - 30 ug/mL Final   Comment: (NOTE) Therapeutic concentrations vary significantly. A range of 10-30 ug/mL  may be an effective concentration for many patients. However, some  are best treated at concentrations outside of this range. Acetaminophen concentrations >150 ug/mL at 4 hours after ingestion  and >50 ug/mL at 12 hours after ingestion are often associated with  toxic reactions.  Performed at [facility], [place] [address], [place], [place] [address]    Magnesium 05/10/2022 2.0  1.7 - 2.4 mg/dL Final   Performed at [place] [address] [place], [place] [address]   Color, Urine 05/10/2022 STRAW (A)  YELLOW Final   APPearance 05/10/2022 CLEAR  CLEAR Final   Specific Gravity, Urine 05/10/2022 1.005  1.005 - 1.030 Final   pH 05/10/2022 5.0  5.0 - 8.0 Final   Glucose, UA 05/10/2022 NEGATIVE  NEGATIVE mg/dL Final   Hgb urine dipstick 05/10/2022 NEGATIVE  NEGATIVE Final   Bilirubin Urine 05/10/2022 NEGATIVE  NEGATIVE Final   Ketones, ur 05/10/2022 NEGATIVE  NEGATIVE mg/dL Final   Protein, ur 05/10/2022 NEGATIVE  NEGATIVE mg/dL Final   Nitrite 05/10/2022 NEGATIVE  NEGATIVE Final   Leukocytes,Ua 05/10/2022 NEGATIVE  NEGATIVE Final   Performed at [place] [address] [place], [place] [address]  Admission on 05/05/2022, Discharged on 05/06/2022  Component Date Value Ref Range Status   WBC 05/05/2022 7.3  4.0 - 10.5 K/uL Final   RBC 05/05/2022 3.68 (L)  4.22 - 5.81 MIL/uL Final   Hemoglobin 05/05/2022 8.2 (L)  13.0 - 17.0 g/dL Final   Comment: Reticulocyte Hemoglobin testing may be clinically indicated, consider ordering this additional test [id number]    HCT 05/05/2022 27.1 (L)  39.0 - 52.0 % Final   MCV  05/05/2022 73.6 (L)  80.0 - 100.0 fL Final   MCH 05/05/2022 22.3 (L)  26.0 - 34.0 pg Final   MCHC 05/05/2022 30.3  30.0 - 36.0 g/dL Final   RDW 05/05/2022 19.6 (H)  11.5 - 15.5 % Final   Platelets 05/05/2022 234  150 - 400 K/uL Final  nRBC 05/05/2022 0.0  0.0 - 0.2 % Final   Neutrophils Relative % 05/05/2022 44  % Final   Neutro Abs 05/05/2022 3.2  1.7 - 7.7 K/uL Final   Lymphocytes Relative 05/05/2022 38  % Final   Lymphs Abs 05/05/2022 2.8  0.7 - 4.0 K/uL Final   Monocytes Relative 05/05/2022 13  % Final   Monocytes Absolute 05/05/2022 0.9  0.1 - 1.0 K/uL Final   Eosinophils Relative 05/05/2022 4  % Final   Eosinophils Absolute 05/05/2022 0.3  0.0 - 0.5 K/uL Final   Basophils Relative 05/05/2022 1  % Final   Basophils Absolute 05/05/2022 0.1  0.0 - 0.1 K/uL Final   Immature Granulocytes 05/05/2022 0  % Final   Abs Immature Granulocytes 05/05/2022 0.02  0.00 - 0.07 K/uL Final   Performed at [facility], [place] [address], [place], [place] [address]   Sodium 05/05/2022 138  135 - 145 mmol/L Final   Potassium 05/05/2022 3.8  3.5 - 5.1 mmol/L Final   Chloride 05/05/2022 109  98 - 111 mmol/L Final   CO2 05/05/2022 17 (L)  22 - 32 mmol/L Final   Glucose, Bld 05/05/2022 91  70 - 99 mg/dL Final   Glucose reference range applies only to samples taken after fasting for at least 8 hours.   BUN 05/05/2022 9  8 - 23 mg/dL Final   Creatinine, Ser 05/05/2022 0.87  0.61 - 1.24 mg/dL Final   Calcium 05/05/2022 8.6 (L)  8.9 - 10.3 mg/dL Final   Total Protein 05/05/2022 7.9  6.5 - 8.1 g/dL Final   Albumin 05/05/2022 3.3 (L)  3.5 - 5.0 g/dL Final   AST 05/05/2022 33  15 - 41 U/L Final   ALT 05/05/2022 23  0 - 44 U/L Final   Alkaline Phosphatase 05/05/2022 60  38 - 126 U/L Final   Total Bilirubin 05/05/2022 0.9  0.3 - 1.2 mg/dL Final   GFR, Estimated 05/05/2022 >60  >60 mL/min Final   Comment: (NOTE) Calculated using the CKD-EPI Creatinine Equation (2021)    Anion gap 05/05/2022 12  5 - 15  Final   Performed at [place] [address], [place], [place] [address]   Alcohol, Ethyl (B) 05/05/2022 169 (H)  <10 mg/dL Final   Comment: (NOTE) Lowest detectable limit for serum alcohol is 10 mg/dL.  For medical purposes only. Performed at [facility], [place] [address], [place], [place] [id number]    Salicylate Lvl [id number] <7.0 (L)  7.0 - 30.0 mg/dL Final   Performed at [place] [address], [place], [place] [address]   Acetaminophen (Tylenol), Serum 05/05/2022 <10 (L)  10 - 30 ug/mL Final   Comment: (NOTE) Therapeutic concentrations vary significantly. A range of 10-30 ug/mL  may be an effective concentration for many patients. However, some  are best treated at concentrations outside of this range. Acetaminophen concentrations >150 ug/mL at 4 hours after ingestion  and >50 ug/mL at 12 hours after ingestion are often associated with  toxic reactions.  Performed at [facility], [place] [address], [place],  [address]    Opiates 05/06/2022 NONE DETECTED  NONE DETECTED Final   Cocaine 05/06/2022 NONE DETECTED  NONE DETECTED Final   Benzodiazepines 05/06/2022 POSITIVE (A)  NONE DETECTED Final   Amphetamines 05/06/2022 NONE DETECTED  NONE DETECTED Final   Tetrahydrocannabinol 05/06/2022 NONE DETECTED  NONE DETECTED Final   Barbiturates 05/06/2022 NONE DETECTED  NONE DETECTED Final  Comment: (NOTE) DRUG SCREEN FOR MEDICAL PURPOSES ONLY.  IF CONFIRMATION IS NEEDED FOR ANY PURPOSE, NOTIFY LAB WITHIN 5 DAYS.  LOWEST DETECTABLE LIMITS FOR URINE DRUG SCREEN Drug Class                     Cutoff (ng/mL) Amphetamine and metabolites    1000 Barbiturate and metabolites    200 Benzodiazepine                 200 Tricyclics and metabolites     300 Opiates and metabolites        300 Cocaine and metabolites        300 THC                            50 Performed at [facility], [address] [place], [place] [address]    SARS  Coronavirus 2 by RT PCR 05/06/2022 NEGATIVE  NEGATIVE Final   Comment: (NOTE) SARS-CoV-2 target nucleic acids are NOT DETECTED.  The SARS-CoV-2 RNA is generally detectable in upper respiratory specimens during the acute phase of infection. The lowest concentration of SARS-CoV-2 viral copies this assay can detect is 138 copies/mL. A negative result does not preclude SARS-Cov-2 infection and should not be used as the sole basis for treatment or other patient management decisions. A negative result may occur with  improper specimen collection/handling, submission of specimen other than nasopharyngeal swab, presence of viral mutation(s) within the areas targeted by this assay, and inadequate number of viral copies(<138 copies/mL). A negative result must be combined with clinical observations, patient history, and epidemiological information. The expected result is Negative.  Fact Sheet for Patients:  BloggerCourse.com  Fact Sheet for Healthcare Providers:  SeriousBroker.it  This test is no                          t yet approved or cleared by the [place] FDA and  has been authorized for detection and/or diagnosis of SARS-CoV-2 by FDA under an Emergency Use Authorization (EUA). This EUA will remain  in effect (meaning this test can be used) for the duration of the COVID-19 declaration under Section 564(b)(1) of the Act, 21 U.S.C.section 360bbb-3(b)(1), unless the authorization is terminated  or revoked sooner.       Influenza A by PCR 05/06/2022 NEGATIVE  NEGATIVE Final   Influenza B by PCR 05/06/2022 NEGATIVE  NEGATIVE Final   Comment: (NOTE) The Xpert Xpress SARS-CoV-2/FLU/RSV plus assay is intended as an aid in the diagnosis of influenza from Nasopharyngeal swab specimens and should not be used as a sole basis for treatment. Nasal washings and aspirates are unacceptable for Xpert Xpress SARS-CoV-2/FLU/RSV testing.  Fact  Sheet for Patients: BloggerCourse.com  Fact Sheet for Healthcare Providers: SeriousBroker.it  This test is not yet approved or cleared by the [place] FDA and has been authorized for detection and/or diagnosis of SARS-CoV-2 by FDA under an Emergency Use Authorization (EUA). This EUA will remain in effect (meaning this test can be used) for the duration of the COVID-19 declaration under Section 564(b)(1) of the Act, 21 U.S.C. section 360bbb-3(b)(1), unless the authorization is terminated or revoked.  Performed at [facility], [address], [place], [place] [address]   Admission on 05/05/2022, Discharged on 05/05/2022  Component Date Value Ref Range Status   Sodium 05/05/2022 138  135 - 145 mmol/L Final  Potassium 05/05/2022 3.7  3.5 - 5.1 mmol/L Final   Chloride 05/05/2022 105  98 - 111 mmol/L Final   CO2 05/05/2022 19 (L)  22 - 32 mmol/L Final   Glucose, Bld 05/05/2022 91  70 - 99 mg/dL Final   Glucose reference range applies only to samples taken after fasting for at least 8 hours.   BUN 05/05/2022 11  8 - 23 mg/dL Final   Creatinine, Ser 05/05/2022 0.90  0.61 - 1.24 mg/dL Final   Calcium 09/32/3557 8.9  8.9 - 10.3 mg/dL Final   GFR, Estimated 05/05/2022 >60  >60 mL/min Final   Comment: (NOTE) Calculated using the CKD-EPI Creatinine Equation (2021)    Anion gap 05/05/2022 14  5 - 15 Final   Performed at [facility], [address], [place], [place] [address]   WBC 05/05/2022 8.0  4.0 - 10.5 K/uL Final   RBC 05/05/2022 3.78 (L)  4.22 - 5.81 MIL/uL Final   Hemoglobin 05/05/2022 8.4 (L)  13.0 - 17.0 g/dL Final   Comment: Reticulocyte Hemoglobin testing may be clinically indicated, consider ordering this additional test [id number]    HCT 05/05/2022 27.4 (L)  39.0 - 52.0 % Final   MCV 05/05/2022 72.5 (L)  80.0 - 100.0 fL Final   MCH 05/05/2022 22.2 (L)  26.0 - 34.0 pg Final   MCHC 05/05/2022 30.7  30.0 -  36.0 g/dL Final   RDW 76/28/3151 19.8 (H)  11.5 - 15.5 % Final   Platelets 05/05/2022 222  150 - 400 K/uL Final   nRBC 05/05/2022 0.0  0.0 - 0.2 % Final   Performed at [facility], [address] [place], [place] [address]   Troponin I (High Sensitivity) 05/05/2022 10  <18 ng/L Final   Comment: (NOTE) Elevated high sensitivity troponin I (hsTnI) values and significant  changes across serial measurements may suggest ACS but many other  chronic and acute conditions are known to elevate hsTnI results.  Refer to the "Links" section for chest pain algorithms and additional  guidance. Performed at [facility], [address] [place], [place] [address]    Troponin I (High Sensitivity) 05/05/2022 7  <18 ng/L Final   Comment: (NOTE) Elevated high sensitivity troponin I (hsTnI) values and significant  changes across serial measurements may suggest ACS but many other  chronic and acute conditions are known to elevate hsTnI results.  Refer to the "Links" section for chest pain algorithms and additional  guidance. Performed at [facility], [address] [place], [place] [address]   Admission on 04/20/2022, Discharged on 05/03/2022  Component Date Value Ref Range Status   Sodium 04/20/2022 135  135 - 145 mmol/L Final   Potassium 04/20/2022 4.1  3.5 - 5.1 mmol/L Final   Chloride 04/20/2022 100  98 - 111 mmol/L Final   CO2 04/20/2022 19 (L)  22 - 32 mmol/L Final   Glucose, Bld 04/20/2022 90  70 - 99 mg/dL Final   Glucose reference range applies only to samples taken after fasting for at least 8 hours.   BUN 04/20/2022 10  8 - 23 mg/dL Final   Creatinine, Ser 04/20/2022 1.06  0.61 - 1.24 mg/dL Final   Calcium 85/46/2703 8.8 (L)  8.9 - 10.3 mg/dL Final   Total Protein 50/06/3817 7.3  6.5 - 8.1 g/dL Final   Albumin 29/93/7169 3.4 (L)  3.5 - 5.0 g/dL Final   AST 67/89/3810 74 (H)  15 - 41 U/L  Final   ALT 04/20/2022 41  0 - 44 U/L Final   Alkaline Phosphatase 04/20/2022 69   38 - 126 U/L Final   Total Bilirubin 04/20/2022 1.4 (H)  0.3 - 1.2 mg/dL Final   GFR, Estimated 04/20/2022 >60  >60 mL/min Final   Comment: (NOTE) Calculated using the CKD-EPI Creatinine Equation (2021)    Anion gap 04/20/2022 16 (H)  5 - 15 Final   Performed at [place] [address] [place], [place] [address]   Sodium 04/20/2022 135  135 - 145 mmol/L Final   Potassium 04/20/2022 4.2  3.5 - 5.1 mmol/L Final   Chloride 04/20/2022 103  98 - 111 mmol/L Final   BUN 04/20/2022 10  8 - 23 mg/dL Final   Creatinine, Ser 04/20/2022 1.30 (H)  0.61 - 1.24 mg/dL Final   Glucose, Bld 04/20/2022 93  70 - 99 mg/dL Final   Glucose reference range applies only to samples taken after fasting for at least 8 hours.   Calcium, Ion 04/20/2022 0.88 (LL)  1.15 - 1.40 mmol/L Final   TCO2 04/20/2022 21 (L)  22 - 32 mmol/L Final   Hemoglobin 04/20/2022 12.9 (L)  13.0 - 17.0 g/dL Final   HCT 04/20/2022 38.0 (L)  39.0 - 52.0 % Final   Comment 04/20/2022 NOTIFIED PHYSICIAN   Final   WBC 04/20/2022 6.9  4.0 - 10.5 K/uL Final   RBC 04/20/2022 4.91  4.22 - 5.81 MIL/uL Final   Hemoglobin 04/20/2022 11.1 (L)  13.0 - 17.0 g/dL Final   HCT 04/20/2022 38.0 (L)  39.0 - 52.0 % Final   MCV 04/20/2022 77.4 (L)  80.0 - 100.0 fL Final   MCH 04/20/2022 22.6 (L)  26.0 - 34.0 pg Final   MCHC 04/20/2022 29.2 (L)  30.0 - 36.0 g/dL Final   RDW 04/20/2022 21.0 (H)  11.5 - 15.5 % Final   Platelets 04/20/2022 139 (L)  150 - 400 K/uL Final   nRBC 04/20/2022 0.0  0.0 - 0.2 % Final   Performed at [facility], [place] [address], [place], [place] [address]   Alcohol, Ethyl (B) 04/20/2022 262 (H)  <10 mg/dL Final   Comment: (NOTE) Lowest detectable limit for serum alcohol is 10 mg/dL.  For medical purposes only. Performed at [facility], [place] [address], [place], [place] [address]    Color, Urine 04/20/2022 YELLOW  YELLOW Final   APPearance 04/20/2022 CLEAR  CLEAR Final   Specific Gravity, Urine 04/20/2022  1.008  1.005 - 1.030 Final   pH 04/20/2022 7.0  5.0 - 8.0 Final   Glucose, UA 04/20/2022 NEGATIVE  NEGATIVE mg/dL Final   Hgb urine dipstick 04/20/2022 NEGATIVE  NEGATIVE Final   Bilirubin Urine 04/20/2022 NEGATIVE  NEGATIVE Final   Ketones, ur 04/20/2022 NEGATIVE  NEGATIVE mg/dL Final   Protein, ur 04/20/2022 NEGATIVE  NEGATIVE mg/dL Final   Nitrite 04/20/2022 NEGATIVE  NEGATIVE Final   Leukocytes,Ua 04/20/2022 NEGATIVE  NEGATIVE Final   Performed at [facility], [place] [address] [place], [place] [address]   Lactic Acid, Venous 04/20/2022 3.2 (HH)  0.5 - 1.9 mmol/L Final   Comment: CRITICAL RESULT CALLED TO, READ BACK BY AND VERIFIED WITH: M.RUGGIERO,RN @1959  04/20/2022 [email or web address] Performed at [facility], [place] [address] [place], [place] [address]    Prothrombin Time 04/20/2022 14.1  11.4 - 15.2 seconds Final   INR 04/20/2022 1.1  0.8 - 1.2 Final   Comment: (NOTE) INR goal  varies based on device and disease states. Performed at [facility], [place] [address] [place], [place] [address]    Blood Bank Specimen 04/20/2022 SAMPLE AVAILABLE FOR TESTING   Final   Sample Expiration 04/20/2022    Final                   Value:04/21/2022,2359 Performed at [place] [address] [place], [place] [address]    Sodium 04/21/2022 141  135 - 145 mmol/L Final   Potassium 04/21/2022 4.1  3.5 - 5.1 mmol/L Final   Chloride 04/21/2022 102  98 - 111 mmol/L Final   CO2 04/21/2022 25  22 - 32 mmol/L Final   Glucose, Bld 04/21/2022 104 (H)  70 - 99 mg/dL Final   Glucose reference range applies only to samples taken after fasting for at least 8 hours.   BUN 04/21/2022 12  8 - 23 mg/dL Final   Creatinine, Ser 04/21/2022 0.97  0.61 - 1.24 mg/dL Final   Calcium 04/21/2022 9.1  8.9 - 10.3 mg/dL Final   GFR, Estimated 04/21/2022 >60  >60 mL/min Final   Comment: (NOTE) Calculated using the CKD-EPI Creatinine Equation (2021)    Anion gap 04/21/2022 14  5 - 15 Final   Performed at  [facility], [place] [address], [place], [place] [address]   WBC 04/21/2022 5.8  4.0 - 10.5 K/uL Final   RBC 04/21/2022 3.94 (L)  4.22 - 5.81 MIL/uL Final   Hemoglobin 04/21/2022 9.1 (L)  13.0 - 17.0 g/dL Final   HCT 04/21/2022 29.3 (L)  39.0 - 52.0 % Final   MCV 04/21/2022 74.4 (L)  80.0 - 100.0 fL Final   MCH 04/21/2022 23.1 (L)  26.0 - 34.0 pg Final   MCHC 04/21/2022 31.1  30.0 - 36.0 g/dL Final   RDW 04/21/2022 20.3 (H)  11.5 - 15.5 % Final   Platelets 04/21/2022 99 (L)  150 - 400 K/uL Final   Comment: Immature Platelet Fraction may be clinically indicated, consider ordering this additional test [id number] REPEATED TO VERIFY PLATELET COUNT CONFIRMED BY SMEAR    nRBC 04/21/2022 0.0  0.0 - 0.2 % Final   Performed at [facility], [place] [address], [place], [place] [address]   HIV Screen 4th Generation wRfx 04/21/2022 Non Reactive  Non Reactive Final   Performed at [facility], [place] [address] [place], [place] [address]   Sodium 04/22/2022 138  135 - 145 mmol/L Final   Potassium 04/22/2022 3.7  3.5 - 5.1 mmol/L Final   Chloride 04/22/2022 103  98 - 111 mmol/L Final   CO2 04/22/2022 26  22 - 32 mmol/L Final   Glucose, Bld 04/22/2022 124 (H)  70 - 99 mg/dL Final   Glucose reference range applies only to samples taken after fasting for at least 8 hours.   BUN 04/22/2022 9  8 - 23 mg/dL Final   Creatinine, Ser 04/22/2022 0.90  0.61 - 1.24 mg/dL Final   Calcium 04/22/2022 8.6 (L)  8.9 - 10.3 mg/dL Final   GFR, Estimated 04/22/2022 >60  >60 mL/min Final   Comment: (NOTE) Calculated using the CKD-EPI Creatinine Equation (2021)    Anion gap 04/22/2022 9  5 - 15 Final   Performed at [facility], [place] [address] [place], [place] [address]   WBC 04/22/2022 3.6 (L)  4.0 - 10.5 K/uL Final   RBC 04/22/2022 3.55 (L)  4.22 - 5.81 MIL/uL Final  Hemoglobin 04/22/2022 8.2 (L)  13.0 - 17.0 g/dL Final   Comment: Reticulocyte Hemoglobin testing may be clinically  indicated, consider ordering this additional test [id number]    HCT 04/22/2022 26.6 (L)  39.0 - 52.0 % Final   MCV 04/22/2022 74.9 (L)  80.0 - 100.0 fL Final   MCH 04/22/2022 23.1 (L)  26.0 - 34.0 pg Final   MCHC 04/22/2022 30.8  30.0 - 36.0 g/dL Final   RDW 04/22/2022 20.5 (H)  11.5 - 15.5 % Final   Platelets 04/22/2022 73 (L)  150 - 400 K/uL Final   Comment: Immature Platelet Fraction may be clinically indicated, consider ordering this additional test [id number] REPEATED TO VERIFY    nRBC 04/22/2022 0.0  0.0 - 0.2 % Final   Performed at [facility], [place] [address] [place], [place] [address]   WBC 04/23/2022 3.7 (L)  4.0 - 10.5 K/uL Final   RBC 04/23/2022 3.61 (L)  4.22 - 5.81 MIL/uL Final   Hemoglobin 04/23/2022 8.4 (L)  13.0 - 17.0 g/dL Final   Comment: Reticulocyte Hemoglobin testing may be clinically indicated, consider ordering this additional test [id number]    HCT 04/23/2022 27.0 (L)  39.0 - 52.0 % Final   MCV 04/23/2022 74.8 (L)  80.0 - 100.0 fL Final   MCH 04/23/2022 23.3 (L)  26.0 - 34.0 pg Final   MCHC 04/23/2022 31.1  30.0 - 36.0 g/dL Final   RDW 04/23/2022 20.9 (H)  11.5 - 15.5 % Final   Platelets 04/23/2022 73 (L)  150 - 400 K/uL Final   Comment: Immature Platelet Fraction may be clinically indicated, consider ordering this additional test [id number] CONSISTENT WITH PREVIOUS RESULT REPEATED TO VERIFY    nRBC 04/23/2022 0.0  0.0 - 0.2 % Final   Performed at [facility], [place] [address] [place], [place] [address]   WBC 04/26/2022 3.8 (L)  4.0 - 10.5 K/uL Final   RBC 04/26/2022 3.34 (L)  4.22 - 5.81 MIL/uL Final   Hemoglobin 04/26/2022 7.7 (L)  13.0 - 17.0 g/dL Final   Comment: Reticulocyte Hemoglobin testing may be clinically indicated, consider ordering this additional test [id number]    HCT 04/26/2022 25.1 (L)  39.0 - 52.0 % Final   MCV 04/26/2022 75.1 (L)  80.0 - 100.0 fL Final   MCH 04/26/2022 23.1 (L)  26.0 - 34.0 pg Final   MCHC 04/26/2022  30.7  30.0 - 36.0 g/dL Final   RDW 04/26/2022 20.3 (H)  11.5 - 15.5 % Final   Platelets 04/26/2022 98 (L)  150 - 400 K/uL Final   REPEATED TO VERIFY   nRBC 04/26/2022 0.0  0.0 - 0.2 % Final   Performed at [facility], [place] [address] [place], [place] [address]   Sodium 04/26/2022 140  135 - 145 mmol/L Final   Potassium 04/26/2022 4.5  3.5 - 5.1 mmol/L Final   Chloride 04/26/2022 105  98 - 111 mmol/L Final   CO2 04/26/2022 28  22 - 32 mmol/L Final   Glucose, Bld 04/26/2022 101 (H)  70 - 99 mg/dL Final   Glucose reference range applies only to samples taken after fasting for at least 8 hours.   BUN 04/26/2022 12  8 - 23 mg/dL Final   Creatinine, Ser 04/26/2022 1.04  0.61 - 1.24 mg/dL Final   Calcium 04/26/2022 8.8 (L)  8.9 - 10.3 mg/dL Final   GFR, Estimated 04/26/2022 >60  >60 mL/min Final   Comment: (NOTE) Calculated using  the CKD-EPI Creatinine Equation (2021)    Anion gap 04/26/2022 7  5 - 15 Final   Performed at [facility], [address] [place], [place] [address]   WBC 04/27/2022 3.8 (L)  4.0 - 10.5 K/uL Final   RBC 04/27/2022 3.59 (L)  4.22 - 5.81 MIL/uL Final   Hemoglobin 04/27/2022 8.2 (L)  13.0 - 17.0 g/dL Final   Comment: Reticulocyte Hemoglobin testing may be clinically indicated, consider ordering this additional test [id number]    HCT 04/27/2022 27.1 (L)  39.0 - 52.0 % Final   MCV 04/27/2022 75.5 (L)  80.0 - 100.0 fL Final   MCH 04/27/2022 22.8 (L)  26.0 - 34.0 pg Final   MCHC 04/27/2022 30.3  30.0 - 36.0 g/dL Final   RDW 62/86/3817 20.1 (H)  11.5 - 15.5 % Final   Platelets 04/27/2022 114 (L)  150 - 400 K/uL Final   REPEATED TO VERIFY   nRBC 04/27/2022 0.0  0.0 - 0.2 % Final   Performed at [facility], [address] [place], [place] [address]   Sodium 04/27/2022 139  135 - 145 mmol/L Final   Potassium 04/27/2022 4.2  3.5 - 5.1 mmol/L Final   Chloride 04/27/2022 108  98 - 111 mmol/L Final   CO2 04/27/2022 25  22 - 32 mmol/L Final   Glucose,  Bld 04/27/2022 117 (H)  70 - 99 mg/dL Final   Glucose reference range applies only to samples taken after fasting for at least 8 hours.   BUN 04/27/2022 9  8 - 23 mg/dL Final   Creatinine, Ser 04/27/2022 1.16  0.61 - 1.24 mg/dL Final   Calcium 79/12/8331 8.7 (L)  8.9 - 10.3 mg/dL Final   GFR, Estimated 04/27/2022 >60  >60 mL/min Final   Comment: (NOTE) Calculated using the CKD-EPI Creatinine Equation (2021)    Anion gap 04/27/2022 6  5 - 15 Final   Performed at [facility], [address] [place], [place] [address]   WBC 04/28/2022 4.5  4.0 - 10.5 K/uL Final   RBC 04/28/2022 3.39 (L)  4.22 - 5.81 MIL/uL Final   Hemoglobin 04/28/2022 7.7 (L)  13.0 - 17.0 g/dL Final   Comment: Reticulocyte Hemoglobin testing may be clinically indicated, consider ordering this additional test [id number]    HCT 04/28/2022 25.4 (L)  39.0 - 52.0 % Final   MCV 04/28/2022 74.9 (L)  80.0 - 100.0 fL Final   MCH 04/28/2022 22.7 (L)  26.0 - 34.0 pg Final   MCHC 04/28/2022 30.3  30.0 - 36.0 g/dL Final   RDW 45/99/7741 19.9 (H)  11.5 - 15.5 % Final   Platelets 04/28/2022 125 (L)  150 - 400 K/uL Final   REPEATED TO VERIFY   nRBC 04/28/2022 0.0  0.0 - 0.2 % Final   Performed at [facility], [address], [place], [place] [address]   WBC 04/29/2022 4.3  4.0 - 10.5 K/uL Final   RBC 04/29/2022 3.39 (L)  4.22 - 5.81 MIL/uL Final   Hemoglobin 04/29/2022 7.8 (L)  13.0 - 17.0 g/dL Final   Comment: Reticulocyte Hemoglobin testing may be clinically indicated, consider ordering this additional test [id number]    HCT 04/29/2022 25.1 (L)  39.0 - 52.0 % Final   MCV 04/29/2022 74.0 (L)  80.0 - 100.0 fL Final   MCH 04/29/2022 23.0 (L)  26.0 - 34.0 pg Final   MCHC 04/29/2022 31.1  30.0 - 36.0 g/dL Final  RDW 04/29/2022 19.9 (H)  11.5 - 15.5 % Final   Platelets 04/29/2022 125 (L)  150 - 400 K/uL Final   REPEATED TO VERIFY   nRBC 04/29/2022 0.0  0.0 - 0.2 % Final   Performed at [facility], [place] [address] [place], [place] [address]   WBC 05/01/2022 3.6 (L)  4.0 - 10.5 K/uL Final   RBC 05/01/2022 3.13 (L)  4.22 - 5.81 MIL/uL Final   Hemoglobin 05/01/2022 7.1 (L)  13.0 - 17.0 g/dL Final   Comment: Reticulocyte Hemoglobin testing may be clinically indicated, consider ordering this additional test [id number]    HCT 05/01/2022 23.0 (L)  39.0 - 52.0 % Final   MCV 05/01/2022 73.5 (L)  80.0 - 100.0 fL Final   MCH 05/01/2022 22.7 (L)  26.0 - 34.0 pg Final   MCHC 05/01/2022 30.9  30.0 - 36.0 g/dL Final   RDW 05/01/2022 19.8 (H)  11.5 - 15.5 % Final   Platelets 05/01/2022 149 (L)  150 - 400 K/uL Final   REPEATED TO VERIFY   nRBC 05/01/2022 0.0  0.0 - 0.2 % Final   Performed at [facility], [place] [address], [place], [place] [address]   Sodium 05/01/2022 137  135 - 145 mmol/L Final   Potassium 05/01/2022 4.0  3.5 - 5.1 mmol/L Final   Chloride 05/01/2022 109  98 - 111 mmol/L Final   CO2 05/01/2022 23  22 - 32 mmol/L Final   Glucose, Bld 05/01/2022 98  70 - 99 mg/dL Final   Glucose reference range applies only to samples taken after fasting for at least 8 hours.   BUN 05/01/2022 14  8 - 23 mg/dL Final   Creatinine, Ser 05/01/2022 0.83  0.61 - 1.24 mg/dL Final   Calcium 05/01/2022 8.4 (L)  8.9 - 10.3 mg/dL Final   GFR, Estimated 05/01/2022 >60  >60 mL/min Final   Comment: (NOTE) Calculated using the CKD-EPI Creatinine Equation (2021)    Anion gap 05/01/2022 5  5 - 15 Final   Performed at [facility], [place] [address], [place], [place] [address]   WBC 05/02/2022 7.8  4.0 - 10.5 K/uL Final   RBC 05/02/2022 3.23 (L)  4.22 - 5.81 MIL/uL Final   Hemoglobin 05/02/2022 7.2 (L)  13.0 - 17.0 g/dL Final   Comment: Reticulocyte Hemoglobin testing may be clinically indicated, consider ordering this additional test [id number]    HCT 05/02/2022 23.6 (L)  39.0 - 52.0 % Final   MCV 05/02/2022 73.1 (L)  80.0 - 100.0 fL Final   MCH 05/02/2022 22.3 (L)  26.0 - 34.0 pg Final   MCHC 05/02/2022  30.5  30.0 - 36.0 g/dL Final   RDW 05/02/2022 19.5 (H)  11.5 - 15.5 % Final   Platelets 05/02/2022 153  150 - 400 K/uL Final   REPEATED TO VERIFY   nRBC 05/02/2022 0.0  0.0 - 0.2 % Final   Performed at [facility], [place] [address], [place], [place] [address]  Admission on 03/31/2022, Discharged on 04/02/2022  Component Date Value Ref Range Status   Sodium 03/31/2022 140  135 - 145 mmol/L Final   Potassium 03/31/2022 3.6  3.5 - 5.1 mmol/L Final   Chloride 03/31/2022 108  98 - 111 mmol/L Final   CO2 03/31/2022 19 (L)  22 - 32 mmol/L Final   Glucose, Bld 03/31/2022 84  70 - 99 mg/dL Final   Glucose reference range applies only to samples taken  after fasting for at least 8 hours.   BUN 03/31/2022 9  8 - 23 mg/dL Final   Creatinine, Ser 03/31/2022 0.86  0.61 - 1.24 mg/dL Final   Calcium 03/31/2022 8.6 (L)  8.9 - 10.3 mg/dL Final   Total Protein 03/31/2022 7.2  6.5 - 8.1 g/dL Final   Albumin 03/31/2022 3.5  3.5 - 5.0 g/dL Final   AST 03/31/2022 56 (H)  15 - 41 U/L Final   ALT 03/31/2022 35  0 - 44 U/L Final   Alkaline Phosphatase 03/31/2022 61  38 - 126 U/L Final   Total Bilirubin 03/31/2022 2.0 (H)  0.3 - 1.2 mg/dL Final   GFR, Estimated 03/31/2022 >60  >60 mL/min Final   Comment: (NOTE) Calculated using the CKD-EPI Creatinine Equation (2021)    Anion gap 03/31/2022 13  5 - 15 Final   Performed at [facility], [place] [address], [place], [place] [address]   Alcohol, Ethyl (B) 03/31/2022 203 (H)  <10 mg/dL Final   Comment: (NOTE) Lowest detectable limit for serum alcohol is 10 mg/dL.  For medical purposes only. Performed at [facility], [place] [address], [place], [place] [id number]    Salicylate Lvl [id number] <7.0 (L)  7.0 - 30.0 mg/dL Final   Performed at [place] [address], [place], [place] [address]   Acetaminophen (Tylenol), Serum 03/31/2022 <10 (L)  10 - 30 ug/mL Final   Comment: (NOTE) Therapeutic concentrations vary significantly. A range of  10-30 ug/mL  may be an effective concentration for many patients. However, some  are best treated at concentrations outside of this range. Acetaminophen concentrations >150 ug/mL at 4 hours after ingestion  and >50 ug/mL at 12 hours after ingestion are often associated with  toxic reactions.  Performed at [facility], [place] [address] [place], [place] [address]    WBC 03/31/2022 4.4  4.0 - 10.5 K/uL Final   RBC 03/31/2022 5.10  4.22 - 5.81 MIL/uL Final   Hemoglobin 03/31/2022 11.8 (L)  13.0 - 17.0 g/dL Final   HCT 03/31/2022 38.5 (L)  39.0 - 52.0 % Final   MCV 03/31/2022 75.5 (L)  80.0 - 100.0 fL Final   MCH 03/31/2022 23.1 (L)  26.0 - 34.0 pg Final   MCHC 03/31/2022 30.6  30.0 - 36.0 g/dL Final   RDW 03/31/2022 19.4 (H)  11.5 - 15.5 % Final   Platelets 03/31/2022 102 (L)  150 - 400 K/uL Final   Comment: Immature Platelet Fraction may be clinically indicated, consider ordering this additional test [id number] REPEATED TO VERIFY PLATELET COUNT CONFIRMED BY SMEAR    nRBC 03/31/2022 0.0  0.0 - 0.2 % Final   Performed at [facility], [place] [address] [place], [place] [address]   Opiates 03/31/2022 NONE DETECTED  NONE DETECTED Final   Cocaine 03/31/2022 NONE DETECTED  NONE DETECTED Final   Benzodiazepines 03/31/2022 NONE DETECTED  NONE DETECTED Final   Amphetamines 03/31/2022 NONE DETECTED  NONE DETECTED Final   Tetrahydrocannabinol 03/31/2022 NONE DETECTED  NONE DETECTED Final   Barbiturates 03/31/2022 NONE DETECTED  NONE DETECTED Final   Comment: (NOTE) DRUG SCREEN FOR MEDICAL PURPOSES ONLY.  IF CONFIRMATION IS NEEDED FOR ANY PURPOSE, NOTIFY LAB WITHIN 5 DAYS.  LOWEST DETECTABLE LIMITS FOR URINE DRUG SCREEN Drug Class                     Cutoff (ng/mL) Amphetamine and metabolites    1000  Barbiturate and metabolites    200 Benzodiazepine                 [id number] Tricyclics and metabolites     300 Opiates and metabolites        300 Cocaine and metabolites         300 THC                            50 Performed at [facility], [place] [address], [place], [place] [address]    SARS Coronavirus 2 by RT PCR 03/31/2022 NEGATIVE  NEGATIVE Final   Comment: (NOTE) SARS-CoV-2 target nucleic acids are NOT DETECTED.  The SARS-CoV-2 RNA is generally detectable in upper and lower respiratory specimens during the acute phase of infection. The lowest concentration of SARS-CoV-2 viral copies this assay can detect is 250 copies / mL. A negative result does not preclude SARS-CoV-2 infection and should not be used as the sole basis for treatment or other patient management decisions.  A negative result may occur with improper specimen collection / handling, submission of specimen other than nasopharyngeal swab, presence of viral mutation(s) within the areas targeted by this assay, and inadequate number of viral copies (<250 copies / mL). A negative result must be combined with clinical observations, patient history, and epidemiological information.  Fact Sheet for Patients:   https://www.patel.info/  Fact Sheet for Healthcare Providers: https://hall.com/  This test is not yet approved or                           cleared by the [place] FDA and has been authorized for detection and/or diagnosis of SARS-CoV-2 by FDA under an Emergency Use Authorization (EUA).  This EUA will remain in effect (meaning this test can be used) for the duration of the COVID-19 declaration under Section 564(b)(1) of the Act, 21 U.S.C. section 360bbb-3(b)(1), unless the authorization is terminated or revoked sooner.  Performed at [facility], [place] [address] [place], [place] [address]   Admission on 01/22/2022, Discharged on 01/22/2022  Component Date Value Ref Range Status   Lipase 01/22/2022 44  11 - 51 U/L Final   Performed at [facility], [place] [address] [place], [place] [address]   Sodium  01/22/2022 138  135 - 145 mmol/L Final   Potassium 01/22/2022 3.4 (L)  3.5 - 5.1 mmol/L Final   Chloride 01/22/2022 105  98 - 111 mmol/L Final   CO2 01/22/2022 24  22 - 32 mmol/L Final   Glucose, Bld 01/22/2022 90  70 - 99 mg/dL Final   Glucose reference range applies only to samples taken after fasting for at least 8 hours.   BUN 01/22/2022 10  8 - 23 mg/dL Final   Creatinine, Ser 01/22/2022 0.87  0.61 - 1.24 mg/dL Final   Calcium 01/22/2022 8.9  8.9 - 10.3 mg/dL Final   Total Protein 01/22/2022 7.9  6.5 - 8.1 g/dL Final   Albumin 01/22/2022 3.7  3.5 - 5.0 g/dL Final   AST 01/22/2022 43 (H)  15 - 41 U/L Final   ALT 01/22/2022 31  0 - 44 U/L Final   Alkaline Phosphatase 01/22/2022 74  38 - 126 U/L Final   Total Bilirubin 01/22/2022 2.0 (H)  0.3 - 1.2 mg/dL Final   GFR, Estimated 01/22/2022 >60  >60 mL/min Final   Comment: (NOTE) Calculated using the CKD-EPI  Creatinine Equation (2021)    Anion gap 01/22/2022 9  5 - 15 Final   Performed at [facility], [place] [address] [place], [place] [address]   WBC 01/22/2022 4.7  4.0 - 10.5 K/uL Final   RBC 01/22/2022 5.84 (H)  4.22 - 5.81 MIL/uL Final   Hemoglobin 01/22/2022 13.1  13.0 - 17.0 g/dL Final   HCT 01/22/2022 43.9  39.0 - 52.0 % Final   MCV 01/22/2022 75.2 (L)  80.0 - 100.0 fL Final   MCH 01/22/2022 22.4 (L)  26.0 - 34.0 pg Final   MCHC 01/22/2022 29.8 (L)  30.0 - 36.0 g/dL Final   RDW 01/22/2022 19.7 (H)  11.5 - 15.5 % Final   Platelets 01/22/2022 128 (L)  150 - 400 K/uL Final   nRBC 01/22/2022 0.0  0.0 - 0.2 % Final   Performed at [facility], [place] [address], [place], [place] [address]   Color, Urine 01/22/2022 STRAW (A)  YELLOW Final   APPearance 01/22/2022 CLEAR  CLEAR Final   Specific Gravity, Urine 01/22/2022 1.002 (L)  1.005 - 1.030 Final   pH 01/22/2022 5.0  5.0 - 8.0 Final   Glucose, UA 01/22/2022 NEGATIVE  NEGATIVE mg/dL Final   Hgb urine dipstick 01/22/2022 NEGATIVE  NEGATIVE Final    Bilirubin Urine 01/22/2022 NEGATIVE  NEGATIVE Final   Ketones, ur 01/22/2022 NEGATIVE  NEGATIVE mg/dL Final   Protein, ur 01/22/2022 NEGATIVE  NEGATIVE mg/dL Final   Nitrite 01/22/2022 NEGATIVE  NEGATIVE Final   Leukocytes,Ua 01/22/2022 NEGATIVE  NEGATIVE Final   Performed at [place] [address], [place], [place] [address]   Opiates 01/22/2022 NONE DETECTED  NONE DETECTED Final   Cocaine 01/22/2022 NONE DETECTED  NONE DETECTED Final   Benzodiazepines 01/22/2022 NONE DETECTED  NONE DETECTED Final   Amphetamines 01/22/2022 NONE DETECTED  NONE DETECTED Final   Tetrahydrocannabinol 01/22/2022 NONE DETECTED  NONE DETECTED Final   Barbiturates 01/22/2022 NONE DETECTED  NONE DETECTED Final   Comment: (NOTE) DRUG SCREEN FOR MEDICAL PURPOSES ONLY.  IF CONFIRMATION IS NEEDED FOR ANY PURPOSE, NOTIFY LAB WITHIN 5 DAYS.  LOWEST DETECTABLE LIMITS FOR URINE DRUG SCREEN Drug Class                     Cutoff (ng/mL) Amphetamine and metabolites    1000 Barbiturate and metabolites    200 Benzodiazepine                 [id number] Tricyclics and metabolites     300 Opiates and metabolites        300 Cocaine and metabolites        300 THC                            50 Performed at [facility], [place] [address] [place], [place] [address]    Alcohol, Ethyl (B) 01/22/2022 134 (H)  <10 mg/dL Final   Comment: (NOTE) Lowest detectable limit for serum alcohol is 10 mg/dL.  For medical purposes only. Performed at [facility], [place] [address] [place], [place] [address]    Specimen Description 01/22/2022    Final                   Value:URINE, CLEAN CATCH Performed at [facility], [place] [address] [place], [place] [address]    Special Requests 01/22/2022    Final  Value:NONE Performed at [facility], [address] [place], [place] [address]    Culture 01/22/2022    Final                    Value:NO GROWTH Performed at [facility], [address], [place], [place] [address]    Report Status 01/22/2022 01/23/2022 FINAL   Final   Ammonia 01/22/2022 18  9 - 35 umol/L Final   Performed at [facility], [address] [place], [place] [address]   Acetaminophen (Tylenol), Serum 01/22/2022 <10 (L)  10 - 30 ug/mL Final   Comment: (NOTE) Therapeutic concentrations vary significantly. A range of 10-30 ug/mL  may be an effective concentration for many patients. However, some  are best treated at concentrations outside of this range. Acetaminophen concentrations >150 ug/mL at 4 hours after ingestion  and >50 ug/mL at 12 hours after ingestion are often associated with  toxic reactions.  Performed at [facility], [address], [place], [place] [address]    Salicylate Lvl 01/22/2022 <7.0 (L)  7.0 - 30.0 mg/dL Final   Performed at [facility], [address], [place], [place] [address]  Admission on 01/10/2022, Discharged on 01/10/2022  Component Date Value Ref Range Status   Sodium 01/10/2022 131 (L)  135 - 145 mmol/L Final   Potassium 01/10/2022 4.0  3.5 - 5.1 mmol/L Final   Chloride 01/10/2022 98  98 - 111 mmol/L Final   CO2 01/10/2022 23  22 - 32 mmol/L Final   Glucose, Bld 01/10/2022 84  70 - 99 mg/dL Final   Glucose reference range applies only to samples taken after fasting for at least 8 hours.   BUN 01/10/2022 8  8 - 23 mg/dL Final   Creatinine, Ser 01/10/2022 0.76  0.61 - 1.24 mg/dL Final   Calcium 20/94/7096 9.1  8.9 - 10.3 mg/dL Final   Total Protein 28/36/6294 7.8  6.5 - 8.1 g/dL Final   Albumin 76/54/6503 3.8  3.5 - 5.0 g/dL Final   AST 54/65/6812 55 (H)  15 - 41 U/L Final   ALT 01/10/2022 36  0 - 44 U/L Final   Alkaline Phosphatase 01/10/2022 76  38 - 126 U/L Final   Total Bilirubin 01/10/2022 3.3 (H)  0.3 - 1.2 mg/dL Final   GFR, Estimated 01/10/2022 >60  >60 mL/min Final   Comment:  (NOTE) Calculated using the CKD-EPI Creatinine Equation (2021)    Anion gap 01/10/2022 10  5 - 15 Final   Performed at [facility], [address], [place], [place] [address]   WBC 01/10/2022 5.7  4.0 - 10.5 K/uL Final   RBC 01/10/2022 5.16  4.22 - 5.81 MIL/uL Final   Hemoglobin 01/10/2022 11.8 (L)  13.0 - 17.0 g/dL Final   HCT 01/74/9449 39.0  39.0 - 52.0 % Final   MCV 01/10/2022 75.6 (L)  80.0 - 100.0 fL Final   MCH 01/10/2022 22.9 (L)  26.0 - 34.0 pg Final   MCHC 01/10/2022 30.3  30.0 - 36.0 g/dL Final   RDW 67/59/1638 18.4 (H)  11.5 - 15.5 % Final   Platelets 01/10/2022 138 (L)  150 - 400 K/uL Final   REPEATED TO VERIFY   nRBC 01/10/2022 0.0  0.0 - 0.2 % Final   Neutrophils Relative % 01/10/2022 61  % Final   Neutro Abs 01/10/2022 3.5  1.7 - 7.7 K/uL Final   Lymphocytes Relative 01/10/2022 27  %  Final   Lymphs Abs 01/10/2022 1.5  0.7 - 4.0 K/uL Final   Monocytes Relative 01/10/2022 10  % Final   Monocytes Absolute 01/10/2022 0.6  0.1 - 1.0 K/uL Final   Eosinophils Relative 01/10/2022 1  % Final   Eosinophils Absolute 01/10/2022 0.0  0.0 - 0.5 K/uL Final   Basophils Relative 01/10/2022 1  % Final   Basophils Absolute 01/10/2022 0.1  0.0 - 0.1 K/uL Final   Immature Granulocytes 01/10/2022 0  % Final   Abs Immature Granulocytes 01/10/2022 0.02  0.00 - 0.07 K/uL Final   Performed at [facility], [place] [address] [place], [place] [address]   Opiates 01/10/2022 NONE DETECTED  NONE DETECTED Final   Cocaine 01/10/2022 NONE DETECTED  NONE DETECTED Final   Benzodiazepines 01/10/2022 NONE DETECTED  NONE DETECTED Final   Amphetamines 01/10/2022 NONE DETECTED  NONE DETECTED Final   Tetrahydrocannabinol 01/10/2022 NONE DETECTED  NONE DETECTED Final   Barbiturates 01/10/2022 NONE DETECTED  NONE DETECTED Final   Comment: (NOTE) DRUG SCREEN FOR MEDICAL PURPOSES ONLY.  IF CONFIRMATION IS NEEDED FOR ANY PURPOSE, NOTIFY LAB WITHIN 5 DAYS.  LOWEST DETECTABLE LIMITS FOR URINE  DRUG SCREEN Drug Class                     Cutoff (ng/mL) Amphetamine and metabolites    1000 Barbiturate and metabolites    200 Benzodiazepine                 [id number] Tricyclics and metabolites     300 Opiates and metabolites        300 Cocaine and metabolites        300 THC                            50 Performed at [facility], [place] [address], [place], [place] [address]    Color, Urine 01/10/2022 YELLOW  YELLOW Final   APPearance 01/10/2022 CLEAR  CLEAR Final   Specific Gravity, Urine 01/10/2022 1.003 (L)  1.005 - 1.030 Final   pH 01/10/2022 5.0  5.0 - 8.0 Final   Glucose, UA 01/10/2022 NEGATIVE  NEGATIVE mg/dL Final   Hgb urine dipstick 01/10/2022 SMALL (A)  NEGATIVE Final   Bilirubin Urine 01/10/2022 NEGATIVE  NEGATIVE Final   Ketones, ur 01/10/2022 5 (A)  NEGATIVE mg/dL Final   Protein, ur 01/10/2022 NEGATIVE  NEGATIVE mg/dL Final   Nitrite 01/10/2022 NEGATIVE  NEGATIVE Final   Leukocytes,Ua 01/10/2022 NEGATIVE  NEGATIVE Final   WBC, UA 01/10/2022 0-5  0 - 5 WBC/hpf Final   Bacteria, UA 01/10/2022 NONE SEEN  NONE SEEN Final   Squamous Epithelial / LPF 01/10/2022 0-5  0 - 5 Final   Performed at [facility], [place] [address] [place], [place] [address]   Lipase 01/10/2022 37  11 - 51 U/L Final   Performed at [place] [address] [place], [place] [address]   Total CK 01/10/2022 163  49 - 397 U/L Final   Performed at [facility], [place] [address], [place], [place] [address]  There may be more visits with results that are not included.    Blood Alcohol level:  Lab Results  Component Value Date   ETH <10 06/11/2022   ETH 194 (H) [id number]    Metabolic Disorder Labs: Lab Results  Component Value Date   HGBA1C 5.2 05/24/2022  MPG 102.54 05/24/2022   MPG 108 05/06/2009   No results found for: "PROLACTIN" Lab Results  Component Value Date   CHOL 140 05/31/2022   TRIG 91 05/31/2022   HDL 54 05/31/2022   CHOLHDL 2.6 05/31/2022   VLDL 18  05/31/2022   LDLCALC 68 05/31/2022   LDLCALC NOT CALCULATED 05/24/2022    Therapeutic Lab Levels: Lab Results  Component Value Date   LITHIUM <0.06 (L) 02/09/2021   No results found for: "VALPROATE" Lab Results  Component Value Date   CBMZ <0.3 (L) 11/19/2010    Physical Findings   AUDIT    Flowsheet Row ED from 05/29/2022 in [facility]  Alcohol Use Disorder Identification Test Final Score (AUDIT) 14      CAGE-AID    Flowsheet Row ED to Hosp-Admission (Discharged) from 04/20/2022 in [facility] ED to Hosp-Admission (Discharged) from 02/07/2021 in [place] Score 4 0      PHQ2-9    [place] ED from 06/10/2022 in [facility] ED from 05/29/2022 in [name] ED from 01/12/2022 in [facility] ED from 01/05/2022 in [place]  PHQ-2 Total Score 6 6 2 5   PHQ-9 Total Score 21 17 7 24       Flowsheet Row ED from 06/10/2022 in [facility] ED from 05/29/2022 in [facility] ED from 05/24/2022 in [place] CATEGORY Moderate Risk No Risk High Risk        Musculoskeletal  Strength & Muscle Tone: within normal limits Gait & Station: normal Patient leans: N/A  Psychiatric Specialty Exam  Presentation  General Appearance: Appropriate for Environment; Casual  Eye Contact:Fair  Speech:Clear and Coherent  Speech Volume:Normal  Handedness:Right   Mood and Affect  Mood:Euthymic  Affect:Appropriate   Thought Process  Thought Processes:Coherent  Descriptions of Associations:Intact  Orientation:Full (Time, Place and Person)  Thought Content:Logical  Diagnosis of Schizophrenia or Schizoaffective disorder in past: No     Hallucinations:Hallucinations: None  Ideas of Reference:None  Suicidal Thoughts:Suicidal Thoughts: No SI Active Intent and/or Plan: Without Intent; Without Plan  Homicidal Thoughts:Homicidal Thoughts: No   Sensorium  Memory:Immediate Fair; Recent Fair  Judgment:Fair  Insight:Fair   Executive Functions  Concentration:Fair  Attention Span:Fair  [place]   Psychomotor Activity  Psychomotor Activity:Psychomotor Activity: Normal   Assets  Assets:Desire for Improvement; Resilience   Sleep  Sleep:Sleep: Poor   No data recorded  Physical Exam  Physical Exam Constitutional:      Appearance: Normal appearance.  HENT:     Head: Normocephalic and atraumatic.  Pulmonary:     Effort: Pulmonary effort is normal.  Neurological:     Mental Status: He is alert and oriented to person, place, and time.    Review of Systems  Psychiatric/Behavioral:  Negative for suicidal ideas. The patient is nervous/anxious and has insomnia.    Blood pressure (!) 155/82, pulse 69, temperature 97.6 F (36.4 C), temperature source Oral, resp. rate 20, SpO2 99 %. There is no height or weight on file to calculate BMI.  Treatment Plan Summary: Daily contact with patient to assess and evaluate symptoms and progress in treatment and Medication management   Patient symptoms appear to be improving, regarding EtOH withdrawal.  Patient will continue scheduled titration. 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

## 2022-06-12 NOTE — ED Notes (Signed)
Snacks given 

## 2022-06-12 NOTE — ED Notes (Signed)
Pt. Eating lunch

## 2022-06-12 NOTE — ED Notes (Signed)
Patient denies SI, HI, AVH - wants his night medications - told I will give them as soon as I can - will continue to monitor for safety

## 2022-06-12 NOTE — ED Notes (Signed)
Patient was awake and alert on unit for a short period of time.  He ate breakfast and drank coffee.  Patient given tylenol for headache.  He has since returned to room and bed as he reports that he did not sleep well last night.  CIWA = 0.  Patient is calm and cooperative with mild irritability.  Will monitor and provide safe environment.

## 2022-06-13 ENCOUNTER — Encounter (HOSPITAL_COMMUNITY): Payer: Self-pay

## 2022-06-13 DIAGNOSIS — F1020 Alcohol dependence, uncomplicated: Secondary | ICD-10-CM | POA: Diagnosis not present

## 2022-06-13 MED ORDER — QUETIAPINE FUMARATE 200 MG PO TABS
200.0000 mg | ORAL_TABLET | Freq: Every day | ORAL | Status: DC
  Administered 2022-06-13 – 2022-06-14 (×2): 200 mg via ORAL
  Filled 2022-06-13 (×2): qty 1
  Filled 2022-06-13: qty 7

## 2022-06-13 MED ORDER — AMLODIPINE BESYLATE 10 MG PO TABS
10.0000 mg | ORAL_TABLET | Freq: Every day | ORAL | Status: DC
  Administered 2022-06-13 – 2022-06-15 (×3): 10 mg via ORAL
  Filled 2022-06-13: qty 7
  Filled 2022-06-13 (×3): qty 1

## 2022-06-13 MED ORDER — GABAPENTIN 300 MG PO CAPS
300.0000 mg | ORAL_CAPSULE | Freq: Three times a day (TID) | ORAL | Status: DC
  Administered 2022-06-13 – 2022-06-15 (×7): 300 mg via ORAL
  Filled 2022-06-13 (×2): qty 1
  Filled 2022-06-13: qty 21
  Filled 2022-06-13 (×5): qty 1

## 2022-06-13 NOTE — ED Notes (Signed)
Pt sleeping@this time. Breathing even and unlabored. Will continue to monitor for safety 

## 2022-06-13 NOTE — ED Notes (Signed)
Patient sleeping with no sxs of distress - will continue to monitor for safety 

## 2022-06-13 NOTE — ED Notes (Signed)
Attended the AA meeting.

## 2022-06-13 NOTE — ED Provider Notes (Signed)
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 the COVID-19 declaration under Section 564(b)(1) of the Act, 21 U.S.C.section 360bbb-3(b)(1), unless the authorization is terminated  or revoked sooner.       Influenza A by PCR 05/24/2022 NEGATIVE  NEGATIVE Final   Influenza B by PCR 05/24/2022 NEGATIVE  NEGATIVE Final   Comment: (NOTE) The Xpert Xpress SARS-CoV-2/FLU/RSV plus assay is intended as an aid in the diagnosis of influenza from Nasopharyngeal swab specimens and should not  be used as a sole basis for treatment. Nasal washings and aspirates are unacceptable for Xpert Xpress SARS-CoV-2/FLU/RSV testing.  Fact Sheet for Patients: BloggerCourse.com  Fact Sheet for Healthcare Providers:  SeriousBroker.it  This test is not yet approved or cleared by the [place] and has been authorized for detection and/or diagnosis of SARS-CoV-2 by FDA under an Emergency Use Authorization (EUA). This EUA will remain in effect (meaning this test can be used) for the duration of the COVID-19 declaration under Section 564(b)(1) of the Act, 21 U.S.C. section 360bbb-3(b)(1), unless the authorization is terminated or revoked.  Performed at [facility], [address] [place], [place] [address]    WBC 05/24/2022 5.3  4.0 - 10.5 K/uL Final   RBC 05/24/2022 3.98 (L)  4.22 - 5.81 MIL/uL Final   Hemoglobin 05/24/2022 8.4 (L)  13.0 - 17.0 g/dL Final   Comment: Reticulocyte Hemoglobin testing may be clinically indicated, consider ordering this additional test [id number]    HCT 05/24/2022 27.4 (L)  39.0 - 52.0 % Final   MCV 05/24/2022 68.8 (L)  80.0 - 100.0 fL Final   MCH 05/24/2022 21.1 (L)  26.0 - 34.0 pg Final   MCHC 05/24/2022 30.7  30.0 - 36.0 g/dL Final   RDW 11/91/4782 19.3 (H)  11.5 - 15.5 % Final   Platelets 05/24/2022 149 (L)  150 - 400 K/uL Final   REPEATED TO VERIFY   nRBC 05/24/2022 0.0  0.0 - 0.2 % Final   Neutrophils Relative % 05/24/2022 39  % Final   Neutro Abs 05/24/2022 2.0  1.7 - 7.7 K/uL Final   Lymphocytes Relative 05/24/2022 45  % Final   Lymphs Abs 05/24/2022 2.4  0.7 - 4.0 K/uL Final   Monocytes Relative 05/24/2022 11  % Final   Monocytes Absolute 05/24/2022 0.6  0.1 - 1.0 K/uL Final   Eosinophils Relative 05/24/2022 3  % Final   Eosinophils Absolute 05/24/2022 0.2  0.0 - 0.5 K/uL Final   Basophils Relative 05/24/2022 2  % Final   Basophils Absolute 05/24/2022 0.1  0.0 - 0.1 K/uL Final    Immature Granulocytes 05/24/2022 0  % Final   Abs Immature Granulocytes 05/24/2022 0.01  0.00 - 0.07 K/uL Final   Performed at [facility], [address], [place], [place] [address]   Sodium 05/24/2022 139  135 - 145 mmol/L Final   Potassium 05/24/2022 3.9  3.5 - 5.1 mmol/L Final   Chloride 05/24/2022 108  98 - 111 mmol/L Final   CO2 05/24/2022 21 (L)  22 - 32 mmol/L Final   Glucose, Bld 05/24/2022 100 (H)  70 - 99 mg/dL Final   Glucose reference range applies only to samples taken after fasting for at least 8 hours.   BUN 05/24/2022 12  8 - 23 mg/dL Final   Creatinine, Ser 05/24/2022 1.04  0.61 - 1.24 mg/dL Final   Calcium 30/86/5784 9.0  8.9 - 10.3 mg/dL Final   Total Protein 69/62/9528 7.4  6.5 - 8.1 g/dL Final   Albumin 41/32/4401 3.6  3.5 - 5.0 g/dL Final   AST 02/72/5366 36  15 - 41 U/L Final   ALT 05/24/2022 26  0 - 44 U/L Final   Alkaline Phosphatase 05/24/2022 77  38 - 126 U/L Final   Total Bilirubin 05/24/2022 1.2  0.3 - 1.2 mg/dL Final   GFR, Estimated 05/24/2022 >60  >60 mL/min Final   Comment: (NOTE) Calculated using the CKD-EPI Creatinine Equation (2021)    Anion gap 05/24/2022 10  5 - 15 Final   Performed at [facility], [address] [place], [place] [address]   Hgb A1c MFr Bld 05/24/2022  5.2  4.8 - 5.6 % Final   Comment: (NOTE) Pre diabetes:          5.7%-6.4%  Diabetes:              >6.4%  Glycemic control for   <7.0% adults with diabetes    Mean Plasma Glucose 05/24/2022 102.54  mg/dL Final   Performed at [facility], [address] [place], [place] [address]   Alcohol, Ethyl (B) 05/24/2022 99 (H)  <10 mg/dL Final   Comment: (NOTE) Lowest detectable limit for serum alcohol is 10 mg/dL.  For medical purposes only. Performed at [facility], [address], [place], [place] [address]    Hepatitis B Surface Ag 05/24/2022 NON REACTIVE  NON REACTIVE Final   HCV Ab 05/24/2022 Reactive (A)  NON REACTIVE Final   Comment:  (NOTE) The CDC recommends that a Reactive HCV antibody result be followed up  with a HCV Nucleic Acid Amplification test.     Hep A IgM 05/24/2022 NON REACTIVE  NON REACTIVE Final   Hep B C IgM 05/24/2022 NON REACTIVE  NON REACTIVE Final   Performed at [facility], [address], [place], [place] [address]   POC Amphetamine UR 05/25/2022 None Detected  NONE DETECTED (Cut Off Level 1000 ng/mL) Final   POC Secobarbital (BAR) 05/25/2022 None Detected  NONE DETECTED (Cut Off Level 300 ng/mL) Final   POC Buprenorphine (BUP) 05/25/2022 None Detected  NONE DETECTED (Cut Off Level 10 ng/mL) Final   POC Oxazepam (BZO) 05/25/2022 Positive (A)  NONE DETECTED (Cut Off Level 300 ng/mL) Final   POC Cocaine UR 05/25/2022 Positive (A)  NONE DETECTED (Cut Off Level 300 ng/mL) Final   POC Methamphetamine UR 05/25/2022 None Detected  NONE DETECTED (Cut Off Level 1000 ng/mL) Final   POC Morphine 05/25/2022 None Detected  NONE DETECTED (Cut Off Level 300 ng/mL) Final   POC Methadone UR 05/25/2022 None Detected  NONE DETECTED (Cut Off Level 300 ng/mL) Final   POC Oxycodone UR 05/25/2022 None Detected  NONE DETECTED (Cut Off Level 100 ng/mL) Final   POC Marijuana UR 05/25/2022 None Detected  NONE DETECTED (Cut Off Level 50 ng/mL) Final   SARSCOV2ONAVIRUS 2 AG 05/24/2022 NEGATIVE  NEGATIVE Final   Comment: (NOTE) SARS-CoV-2 antigen NOT DETECTED.   Negative results are presumptive.  Negative results do not preclude SARS-CoV-2 infection and should not be used as the sole basis for treatment or other patient management decisions, including infection  control decisions, particularly in the presence of clinical signs and  symptoms consistent with COVID-19, or in those who have been in contact with the virus.  Negative results must be combined with clinical observations, patient history, and epidemiological information. The expected result is Negative.  Fact Sheet for Patients:  https://www.jennings-kim.com/  Fact Sheet for Healthcare Providers: https://alexander-rogers.biz/  This test is not yet approved or cleared by the [place] FDA and  has been authorized for detection and/or diagnosis of SARS-CoV-2 by FDA under an Emergency Use Authorization (EUA).  This EUA will remain in effect (meaning this test can be used) for the duration of  the COV                          ID-19 declaration under Section 564(b)(1) of the Act, 21 U.S.C. section 360bbb-3(b)(1), unless the authorization is terminated or revoked sooner.     Cholesterol 05/24/2022 141  0 - 200 mg/dL Final  Triglycerides 05/24/2022 94  <150 mg/dL Final   HDL 32/44/0102 57  >40 mg/dL Final   Total CHOL/HDL Ratio 05/24/2022 2.5  RATIO Final   VLDL 05/24/2022 19  0 - 40 mg/dL Final   LDL Cholesterol 05/24/2022 NOT CALCULATED  0 - 99 mg/dL Final   Performed at [facility], [address] [place], [place] [address]   TSH 05/24/2022 2.834  0.350 - 4.500 uIU/mL Final   Comment: Performed by a 3rd Generation assay with a functional sensitivity of <=0.01 uIU/mL. Performed at [facility], [address] [name], [place] [address]   Admission on 05/10/2022, Discharged on 05/11/2022  Component Date Value Ref Range Status   SARS Coronavirus 2 by RT PCR 05/10/2022 NEGATIVE  NEGATIVE Final   Comment: (NOTE) SARS-CoV-2 target nucleic acids are NOT DETECTED.  The SARS-CoV-2 RNA is generally detectable in upper respiratory specimens during the acute phase of infection. The lowest concentration of SARS-CoV-2 viral copies this assay can detect is 138 copies/mL. A negative result does not preclude SARS-Cov-2 infection and should not be used as the sole basis for treatment or other patient management decisions. A negative result may occur with  improper specimen collection/handling, submission of specimen other than nasopharyngeal swab, presence of viral mutation(s)  within the areas targeted by this assay, and inadequate number of viral copies(<138 copies/mL). A negative result must be combined with clinical observations, patient history, and epidemiological information. The expected result is Negative.  Fact Sheet for Patients:  BloggerCourse.com  Fact Sheet for Healthcare Providers:  SeriousBroker.it  This test is no                          t yet approved or cleared by the [place] FDA and  has been authorized for detection and/or diagnosis of SARS-CoV-2 by FDA under an Emergency Use Authorization (EUA). This EUA will remain  in effect (meaning this test can be used) for the duration of the COVID-19 declaration under Section 564(b)(1) of the Act, 21 U.S.C.section 360bbb-3(b)(1), unless the authorization is terminated  or revoked sooner.       Influenza A by PCR 05/10/2022 NEGATIVE  NEGATIVE Final   Influenza B by PCR 05/10/2022 NEGATIVE  NEGATIVE Final   Comment: (NOTE) The Xpert Xpress SARS-CoV-2/FLU/RSV plus assay is intended as an aid in the diagnosis of influenza from Nasopharyngeal swab specimens and should not be used as a sole basis for treatment. Nasal washings and aspirates are unacceptable for Xpert Xpress SARS-CoV-2/FLU/RSV testing.  Fact Sheet for Patients: BloggerCourse.com  Fact Sheet for Healthcare Providers: SeriousBroker.it  This test is not yet approved or cleared by the [place] FDA and has been authorized for detection and/or diagnosis of SARS-CoV-2 by FDA under an Emergency Use Authorization (EUA). This EUA will remain in effect (meaning this test can be used) for the duration of the COVID-19 declaration under Section 564(b)(1) of the Act, 21 U.S.C. section 360bbb-3(b)(1), unless the authorization is terminated or revoked.  Performed at [facility], [address] [place],  [place] [address]    Sodium 05/10/2022 137  135 - 145 mmol/L Final   Potassium 05/10/2022 3.7  3.5 - 5.1 mmol/L Final   Chloride 05/10/2022 106  98 - 111 mmol/L Final   CO2 05/10/2022 23  22 - 32 mmol/L Final   Glucose, Bld 05/10/2022 90  70 - 99 mg/dL Final   Glucose reference range applies only to samples  taken after fasting for at least 8 hours.   BUN 05/10/2022 13  8 - 23 mg/dL Final   Creatinine, Ser 05/10/2022 1.04  0.61 - 1.24 mg/dL Final   Calcium 81/19/1478 8.8 (L)  8.9 - 10.3 mg/dL Final   Total Protein 29/56/2130 7.8  6.5 - 8.1 g/dL Final   Albumin 86/57/8469 3.6  3.5 - 5.0 g/dL Final   AST 62/95/2841 30  15 - 41 U/L Final   ALT 05/10/2022 21  0 - 44 U/L Final   Alkaline Phosphatase 05/10/2022 62  38 - 126 U/L Final   Total Bilirubin 05/10/2022 0.8  0.3 - 1.2 mg/dL Final   GFR, Estimated 05/10/2022 >60  >60 mL/min Final   Comment: (NOTE) Calculated using the CKD-EPI Creatinine Equation (2021)    Anion gap 05/10/2022 8  5 - 15 Final   Performed at [facility], [address], [place], [place] [address]   Alcohol, Ethyl (B) 05/10/2022 156 (H)  <10 mg/dL Final   Comment: (NOTE) Lowest detectable limit for serum alcohol is 10 mg/dL.  For medical purposes only. Performed at [facility], [address] [place], [place] [address]    Opiates 05/10/2022 POSITIVE (A)  NONE DETECTED Final   Cocaine 05/10/2022 POSITIVE (A)  NONE DETECTED Final   Benzodiazepines 05/10/2022 POSITIVE (A)  NONE DETECTED Final   Amphetamines 05/10/2022 NONE DETECTED  NONE DETECTED Final   Tetrahydrocannabinol 05/10/2022 NONE DETECTED  NONE DETECTED Final   Barbiturates 05/10/2022 NONE DETECTED  NONE DETECTED Final   Comment: (NOTE) DRUG SCREEN FOR MEDICAL PURPOSES ONLY.  IF CONFIRMATION IS NEEDED FOR ANY PURPOSE, NOTIFY LAB WITHIN 5 DAYS.  LOWEST DETECTABLE LIMITS FOR URINE DRUG SCREEN Drug Class                     Cutoff (ng/mL) Amphetamine and metabolites     1000 Barbiturate and metabolites    200 Benzodiazepine                 200 Tricyclics and metabolites     300 Opiates and metabolites        300 Cocaine and metabolites        300 THC                            50 Performed at [facility], [address], [place], [place] [address]    WBC 05/10/2022 4.5  4.0 - 10.5 K/uL Final   RBC 05/10/2022 3.77 (L)  4.22 - 5.81 MIL/uL Final   Hemoglobin 05/10/2022 8.3 (L)  13.0 - 17.0 g/dL Final   Comment: Reticulocyte Hemoglobin testing may be clinically indicated, consider ordering this additional test [id number]    HCT 05/10/2022 27.3 (L)  39.0 - 52.0 % Final   MCV 05/10/2022 72.4 (L)  80.0 - 100.0 fL Final   MCH 05/10/2022 22.0 (L)  26.0 - 34.0 pg Final   MCHC 05/10/2022 30.4  30.0 - 36.0 g/dL Final   RDW 95/63/8756 19.8 (H)  11.5 - 15.5 % Final   Platelets 05/10/2022 162  150 - 400 K/uL Final   nRBC 05/10/2022 0.0  0.0 - 0.2 % Final   Neutrophils Relative % 05/10/2022 55  % Final   Neutro Abs 05/10/2022 2.5  1.7 - 7.7 K/uL Final   Lymphocytes Relative 05/10/2022 32  % Final   Lymphs Abs 05/10/2022 1.4  0.7 - 4.0 K/uL  Final   Monocytes Relative 05/10/2022 10  % Final   Monocytes Absolute 05/10/2022 0.4  0.1 - 1.0 K/uL Final   Eosinophils Relative 05/10/2022 2  % Final   Eosinophils Absolute 05/10/2022 0.1  0.0 - 0.5 K/uL Final   Basophils Relative 05/10/2022 1  % Final   Basophils Absolute 05/10/2022 0.1  0.0 - 0.1 K/uL Final   Immature Granulocytes 05/10/2022 0  % Final   Abs Immature Granulocytes 05/10/2022 0.01  0.00 - 0.07 K/uL Final   Performed at [facility], [address], [place], [place] [address]   Salicylate Lvl 05/10/2022 <7.0 (L)  7.0 - 30.0 mg/dL Final   Performed at [facility], [address], [place], [place] [address]   Acetaminophen (Tylenol), Serum 05/10/2022 <10 (L)  10 - 30 ug/mL Final   Comment: (NOTE) Therapeutic concentrations vary significantly. A  range of 10-30 ug/mL  may be an effective concentration for many patients. However, some  are best treated at concentrations outside of this range. Acetaminophen concentrations >150 ug/mL at 4 hours after ingestion  and >50 ug/mL at 12 hours after ingestion are often associated with  toxic reactions.  Performed at [facility], [address] [place], [place] [address]    Magnesium 05/10/2022 2.0  1.7 - 2.4 mg/dL Final   Performed at [facility], [address], [place], [place] [address]   Color, Urine 05/10/2022 STRAW (A)  YELLOW Final   APPearance 05/10/2022 CLEAR  CLEAR Final   Specific Gravity, Urine 05/10/2022 1.005  1.005 - 1.030 Final   pH 05/10/2022 5.0  5.0 - 8.0 Final   Glucose, UA 05/10/2022 NEGATIVE  NEGATIVE mg/dL Final   Hgb urine dipstick 05/10/2022 NEGATIVE  NEGATIVE Final   Bilirubin Urine 05/10/2022 NEGATIVE  NEGATIVE Final   Ketones, ur 05/10/2022 NEGATIVE  NEGATIVE mg/dL Final   Protein, ur 29/56/2130 NEGATIVE  NEGATIVE mg/dL Final   Nitrite 86/57/8469 NEGATIVE  NEGATIVE Final   Leukocytes,Ua 05/10/2022 NEGATIVE  NEGATIVE Final   Performed at [facility], [address], [place], [place] [address]  Admission on 05/05/2022, Discharged on 05/06/2022  Component Date Value Ref Range Status   WBC 05/05/2022 7.3  4.0 - 10.5 K/uL Final   RBC 05/05/2022 3.68 (L)  4.22 - 5.81 MIL/uL Final   Hemoglobin 05/05/2022 8.2 (L)  13.0 - 17.0 g/dL Final   Comment: Reticulocyte Hemoglobin testing may be clinically indicated, consider ordering this additional test [id number]    HCT 05/05/2022 27.1 (L)  39.0 - 52.0 % Final   MCV 05/05/2022 73.6 (L)  80.0 - 100.0 fL Final   MCH 05/05/2022 22.3 (L)  26.0 - 34.0 pg Final   MCHC 05/05/2022 30.3  30.0 - 36.0 g/dL Final   RDW 08/13/2535 19.6 (H)  11.5 - 15.5 % Final   Platelets 05/05/2022 234  150 - 400 K/uL Final   nRBC 05/05/2022 0.0  0.0 - 0.2 % Final   Neutrophils  Relative % 05/05/2022 44  % Final   Neutro Abs 05/05/2022 3.2  1.7 - 7.7 K/uL Final   Lymphocytes Relative 05/05/2022 38  % Final   Lymphs Abs 05/05/2022 2.8  0.7 - 4.0 K/uL Final   Monocytes Relative 05/05/2022 13  % Final   Monocytes Absolute 05/05/2022 0.9  0.1 - 1.0 K/uL Final   Eosinophils Relative 05/05/2022 4  % Final   Eosinophils Absolute 05/05/2022 0.3  0.0 - 0.5 K/uL Final   Basophils  Relative 05/05/2022 1  % Final   Basophils Absolute 05/05/2022 0.1  0.0 - 0.1 K/uL Final   Immature Granulocytes 05/05/2022 0  % Final   Abs Immature Granulocytes 05/05/2022 0.02  0.00 - 0.07 K/uL Final   Performed at [facility], [address] [place], [place] [address]   Sodium 05/05/2022 138  135 - 145 mmol/L Final   Potassium 05/05/2022 3.8  3.5 - 5.1 mmol/L Final   Chloride 05/05/2022 109  98 - 111 mmol/L Final   CO2 05/05/2022 17 (L)  22 - 32 mmol/L Final   Glucose, Bld 05/05/2022 91  70 - 99 mg/dL Final   Glucose reference range applies only to samples taken after fasting for at least 8 hours.   BUN 05/05/2022 9  8 - 23 mg/dL Final   Creatinine, Ser 05/05/2022 0.87  0.61 - 1.24 mg/dL Final   Calcium 60/45/4098 8.6 (L)  8.9 - 10.3 mg/dL Final   Total Protein 11/91/4782 7.9  6.5 - 8.1 g/dL Final   Albumin 95/62/1308 3.3 (L)  3.5 - 5.0 g/dL Final   AST 65/78/4696 33  15 - 41 U/L Final   ALT 05/05/2022 23  0 - 44 U/L Final   Alkaline Phosphatase 05/05/2022 60  38 - 126 U/L Final   Total Bilirubin 05/05/2022 0.9  0.3 - 1.2 mg/dL Final   GFR, Estimated 05/05/2022 >60  >60 mL/min Final   Comment: (NOTE) Calculated using the CKD-EPI Creatinine Equation (2021)    Anion gap 05/05/2022 12  5 - 15 Final   Performed at [facility], [address] [place], [place] [address]   Alcohol, Ethyl (B) 05/05/2022 169 (H)  <10 mg/dL Final   Comment: (NOTE) Lowest detectable limit for serum alcohol is 10 mg/dL.  For medical purposes only. Performed at [facility], [address], [place], [place] [address]    Salicylate Lvl 05/05/2022 <7.0 (L)  7.0 - 30.0 mg/dL Final   Performed at [facility], [address] [place], [place] [address]   Acetaminophen (Tylenol), Serum 05/05/2022 <10 (L)  10 - 30 ug/mL Final   Comment: (NOTE) Therapeutic concentrations vary significantly. A range of 10-30 ug/mL  may be an effective concentration for many patients. However, some  are best treated at concentrations outside of this range. Acetaminophen concentrations >150 ug/mL at 4 hours after ingestion  and >50 ug/mL at 12 hours after ingestion are often associated with  toxic reactions.  Performed at [facility], [address] [place], [place] [address]    Opiates 05/06/2022 NONE DETECTED  NONE DETECTED Final   Cocaine 05/06/2022 NONE DETECTED  NONE DETECTED Final   Benzodiazepines 05/06/2022 POSITIVE (A)  NONE DETECTED Final   Amphetamines 05/06/2022 NONE DETECTED  NONE DETECTED Final   Tetrahydrocannabinol 05/06/2022 NONE DETECTED  NONE DETECTED Final   Barbiturates 05/06/2022 NONE DETECTED  NONE DETECTED Final   Comment: (NOTE) DRUG SCREEN FOR MEDICAL PURPOSES ONLY.  IF CONFIRMATION IS NEEDED FOR ANY PURPOSE, NOTIFY LAB WITHIN 5 DAYS.  LOWEST DETECTABLE LIMITS FOR URINE DRUG SCREEN Drug Class                     Cutoff (ng/mL) Amphetamine and metabolites    1000 Barbiturate and metabolites    200 Benzodiazepine                 200 Tricyclics and metabolites     300 Opiates and  metabolites        300 Cocaine and metabolites        300 THC                            50 Performed at [facility], [address], [place], [place] [address]    SARS Coronavirus 2 by RT PCR 05/06/2022 NEGATIVE  NEGATIVE Final   Comment: (NOTE) SARS-CoV-2 target nucleic acids are NOT DETECTED.  The SARS-CoV-2 RNA is generally detectable in upper respiratory specimens during the acute phase of infection. The lowest concentration of SARS-CoV-2 viral copies  this assay can detect is 138 copies/mL. A negative result does not preclude SARS-Cov-2 infection and should not be used as the sole basis for treatment or other patient management decisions. A negative result may occur with  improper specimen collection/handling, submission of specimen other than nasopharyngeal swab, presence of viral mutation(s) within the areas targeted by this assay, and inadequate number of viral copies(<138 copies/mL). A negative result must be combined with clinical observations, patient history, and epidemiological information. The expected result is Negative.  Fact Sheet for Patients:  BloggerCourse.com  Fact Sheet for Healthcare Providers:  SeriousBroker.it  This test is no                          t yet approved or cleared by the [place] FDA and  has been authorized for detection and/or diagnosis of SARS-CoV-2 by FDA under an Emergency Use Authorization (EUA). This EUA will remain  in effect (meaning this test can be used) for the duration of the COVID-19 declaration under Section 564(b)(1) of the Act, 21 U.S.C.section 360bbb-3(b)(1), unless the authorization is terminated  or revoked sooner.       Influenza A by PCR 05/06/2022 NEGATIVE  NEGATIVE Final   Influenza B by PCR 05/06/2022 NEGATIVE  NEGATIVE Final   Comment: (NOTE) The Xpert Xpress SARS-CoV-2/FLU/RSV plus assay is intended as an aid in the diagnosis of influenza from Nasopharyngeal swab specimens and should not be used as a sole basis for treatment. Nasal washings and aspirates are unacceptable for Xpert Xpress SARS-CoV-2/FLU/RSV testing.  Fact Sheet for Patients: BloggerCourse.com  Fact Sheet for Healthcare Providers: SeriousBroker.it  This test is not yet approved or cleared by the [place] FDA and has been authorized for detection and/or diagnosis of SARS-CoV-2 by FDA under  an Emergency Use Authorization (EUA). This EUA will remain in effect (meaning this test can be used) for the duration of the COVID-19 declaration under Section 564(b)(1) of the Act, 21 U.S.C. section 360bbb-3(b)(1), unless the authorization is terminated or revoked.  Performed at [facility], [address] [place], [place] [address]   Admission on 05/05/2022, Discharged on 05/05/2022  Component Date Value Ref Range Status   Sodium 05/05/2022 138  135 - 145 mmol/L Final   Potassium 05/05/2022 3.7  3.5 - 5.1 mmol/L Final   Chloride 05/05/2022 105  98 - 111 mmol/L Final   CO2 05/05/2022 19 (L)  22 - 32 mmol/L Final   Glucose, Bld 05/05/2022 91  70 - 99 mg/dL Final   Glucose reference range applies only to samples taken after fasting for at least 8 hours.   BUN 05/05/2022 11  8 - 23 mg/dL Final   Creatinine, Ser 05/05/2022 0.90  0.61 - 1.24 mg/dL Final   Calcium 73/42/8768 8.9  8.9 - 10.3 mg/dL Final  GFR, Estimated 05/05/2022 >60  >60 mL/min Final   Comment: (NOTE) Calculated using the CKD-EPI Creatinine Equation (2021)    Anion gap 05/05/2022 14  5 - 15 Final   Performed at [facility], [address], [place], [place] [address]   WBC 05/05/2022 8.0  4.0 - 10.5 K/uL Final   RBC 05/05/2022 3.78 (L)  4.22 - 5.81 MIL/uL Final   Hemoglobin 05/05/2022 8.4 (L)  13.0 - 17.0 g/dL Final   Comment: Reticulocyte Hemoglobin testing may be clinically indicated, consider ordering this additional test [id number]    HCT 05/05/2022 27.4 (L)  39.0 - 52.0 % Final   MCV 05/05/2022 72.5 (L)  80.0 - 100.0 fL Final   MCH 05/05/2022 22.2 (L)  26.0 - 34.0 pg Final   MCHC 05/05/2022 30.7  30.0 - 36.0 g/dL Final   RDW 11/91/4782 19.8 (H)  11.5 - 15.5 % Final   Platelets 05/05/2022 222  150 - 400 K/uL Final   nRBC 05/05/2022 0.0  0.0 - 0.2 % Final   Performed at [facility], [address], [place], [place] [address]   Troponin I (High Sensitivity) 05/05/2022 10  <18 ng/L Final    Comment: (NOTE) Elevated high sensitivity troponin I (hsTnI) values and significant  changes across serial measurements may suggest ACS but many other  chronic and acute conditions are known to elevate hsTnI results.  Refer to the "Links" section for chest pain algorithms and additional  guidance. Performed at [facility], [address], [place], [place] [address]    Troponin I (High Sensitivity) 05/05/2022 7  <18 ng/L Final   Comment: (NOTE) Elevated high sensitivity troponin I (hsTnI) values and significant  changes across serial measurements may suggest ACS but many other  chronic and acute conditions are known to elevate hsTnI results.  Refer to the "Links" section for chest pain algorithms and additional  guidance. Performed at [facility], [address] [place], [place] [address]   Admission on 04/20/2022, Discharged on 05/03/2022  Component Date Value Ref Range Status   Sodium 04/20/2022 135  135 - 145 mmol/L Final   Potassium 04/20/2022 4.1  3.5 - 5.1 mmol/L Final   Chloride 04/20/2022 100  98 - 111 mmol/L Final   CO2 04/20/2022 19 (L)  22 - 32 mmol/L Final   Glucose, Bld 04/20/2022 90  70 - 99 mg/dL Final   Glucose reference range applies only to samples taken after fasting for at least 8 hours.   BUN 04/20/2022 10  8 - 23 mg/dL Final   Creatinine, Ser 04/20/2022 1.06  0.61 - 1.24 mg/dL Final   Calcium 62/95/2841 8.8 (L)  8.9 - 10.3 mg/dL Final   Total Protein 32/44/0102 7.3  6.5 - 8.1 g/dL Final   Albumin 72/53/6644 3.4 (L)  3.5 - 5.0 g/dL Final   AST 03/47/4259 74 (H)  15 - 41 U/L Final   ALT 04/20/2022 41  0 - 44 U/L Final   Alkaline Phosphatase 04/20/2022 69  38 - 126 U/L Final   Total Bilirubin 04/20/2022 1.4 (H)  0.3 - 1.2 mg/dL Final   GFR, Estimated 04/20/2022 >60  >60 mL/min Final   Comment: (NOTE) Calculated using the CKD-EPI Creatinine Equation (2021)    Anion gap 04/20/2022 16 (H)  5 - 15 Final   Performed at [facility], [address] [place], [place] [address]   Sodium 04/20/2022 135  135 - 145 mmol/L  Final   Potassium 04/20/2022 4.2  3.5 - 5.1 mmol/L Final   Chloride 04/20/2022 103  98 - 111 mmol/L Final   BUN 04/20/2022 10  8 - 23 mg/dL Final   Creatinine, Ser 04/20/2022 1.30 (H)  0.61 - 1.24 mg/dL Final   Glucose, Bld 40/98/1191 93  70 - 99 mg/dL Final   Glucose reference range applies only to samples taken after fasting for at least 8 hours.   Calcium, Ion 04/20/2022 0.88 (LL)  1.15 - 1.40 mmol/L Final   TCO2 04/20/2022 21 (L)  22 - 32 mmol/L Final   Hemoglobin 04/20/2022 12.9 (L)  13.0 - 17.0 g/dL Final   HCT 47/82/9562 38.0 (L)  39.0 - 52.0 % Final   Comment 04/20/2022 NOTIFIED PHYSICIAN   Final   WBC 04/20/2022 6.9  4.0 - 10.5 K/uL Final   RBC 04/20/2022 4.91  4.22 - 5.81 MIL/uL Final   Hemoglobin 04/20/2022 11.1 (L)  13.0 - 17.0 g/dL Final   HCT 13/05/6577 38.0 (L)  39.0 - 52.0 % Final   MCV 04/20/2022 77.4 (L)  80.0 - 100.0 fL Final   MCH 04/20/2022 22.6 (L)  26.0 - 34.0 pg Final   MCHC 04/20/2022 29.2 (L)  30.0 - 36.0 g/dL Final   RDW 46/96/2952 21.0 (H)  11.5 - 15.5 % Final   Platelets 04/20/2022 139 (L)  150 - 400 K/uL Final   nRBC 04/20/2022 0.0  0.0 - 0.2 % Final   Performed at [facility], [address] [place], [place] [address]   Alcohol, Ethyl (B) 04/20/2022 262 (H)  <10 mg/dL Final   Comment: (NOTE) Lowest detectable limit for serum alcohol is 10 mg/dL.  For medical purposes only. Performed at [facility], [address], [place], [place] [address]    Color, Urine 04/20/2022 YELLOW  YELLOW Final   APPearance 04/20/2022 CLEAR  CLEAR Final   Specific Gravity, Urine 04/20/2022 1.008  1.005 - 1.030 Final   pH 04/20/2022 7.0  5.0 - 8.0 Final   Glucose, UA 04/20/2022 NEGATIVE  NEGATIVE mg/dL Final   Hgb urine dipstick 04/20/2022 NEGATIVE  NEGATIVE Final   Bilirubin Urine 04/20/2022 NEGATIVE  NEGATIVE Final   Ketones, ur 04/20/2022 NEGATIVE  NEGATIVE mg/dL Final   Protein,  ur 27/25/3664 NEGATIVE  NEGATIVE mg/dL Final   Nitrite 40/34/7425 NEGATIVE  NEGATIVE Final   Leukocytes,Ua 04/20/2022 NEGATIVE  NEGATIVE Final   Performed at [facility], [address], [place], [place] [address]   Lactic Acid, Venous 04/20/2022 3.2 (HH)  0.5 - 1.9 mmol/L Final   Comment: CRITICAL RESULT CALLED TO, READ BACK BY AND VERIFIED WITH: M.RUGGIERO,RN @1959  04/20/2022 [email or web address] Performed at [facility], [address] [place], [place] [address]    Prothrombin Time 04/20/2022 14.1  11.4 - 15.2 seconds Final   INR 04/20/2022 1.1  0.8 - 1.2 Final   Comment: (NOTE) INR goal varies based on device and disease states. Performed at [facility], [address] [place], [place] [address]    Blood Bank Specimen 04/20/2022 SAMPLE AVAILABLE FOR TESTING   Final   Sample Expiration 04/20/2022    Final                   Value:04/21/2022,2359 Performed at [facility], [address], [place], [place] [address]    Sodium 04/21/2022 141  135 - 145 mmol/L Final   Potassium 04/21/2022 4.1  3.5 - 5.1 mmol/L  Final   Chloride 04/21/2022 102  98 - 111 mmol/L Final   CO2 04/21/2022 25  22 - 32 mmol/L Final   Glucose, Bld 04/21/2022 104 (H)  70 - 99 mg/dL Final   Glucose reference range applies only to samples taken after fasting for at least 8 hours.   BUN 04/21/2022 12  8 - 23 mg/dL Final   Creatinine, Ser 04/21/2022 0.97  0.61 - 1.24 mg/dL Final   Calcium 16/07/9603 9.1  8.9 - 10.3 mg/dL Final   GFR, Estimated 04/21/2022 >60  >60 mL/min Final   Comment: (NOTE) Calculated using the CKD-EPI Creatinine Equation (2021)    Anion gap 04/21/2022 14  5 - 15 Final   Performed at [facility], [address] [place], [place] [address]   WBC 04/21/2022 5.8  4.0 - 10.5 K/uL Final   RBC 04/21/2022 3.94 (L)  4.22 - 5.81 MIL/uL Final   Hemoglobin 04/21/2022 9.1 (L)  13.0 - 17.0 g/dL Final   HCT 11/91/4782 29.3 (L)  39.0 - 52.0 % Final   MCV 04/21/2022 74.4 (L)  80.0 -  100.0 fL Final   MCH 04/21/2022 23.1 (L)  26.0 - 34.0 pg Final   MCHC 04/21/2022 31.1  30.0 - 36.0 g/dL Final   RDW 95/62/1308 20.3 (H)  11.5 - 15.5 % Final   Platelets 04/21/2022 99 (L)  150 - 400 K/uL Final   Comment: Immature Platelet Fraction may be clinically indicated, consider ordering this additional test [id number] REPEATED TO VERIFY PLATELET COUNT CONFIRMED BY SMEAR    nRBC 04/21/2022 0.0  0.0 - 0.2 % Final   Performed at [facility], [address], [place], [place] [address]   HIV Screen 4th Generation wRfx 04/21/2022 Non Reactive  Non Reactive Final   Performed at [facility], [address] [place], [place] [address]   Sodium 04/22/2022 138  135 - 145 mmol/L Final   Potassium 04/22/2022 3.7  3.5 - 5.1 mmol/L Final   Chloride 04/22/2022 103  98 - 111 mmol/L Final   CO2 04/22/2022 26  22 - 32 mmol/L Final   Glucose, Bld 04/22/2022 124 (H)  70 - 99 mg/dL Final   Glucose reference range applies only to samples taken after fasting for at least 8 hours.   BUN 04/22/2022 9  8 - 23 mg/dL Final   Creatinine, Ser 04/22/2022 0.90  0.61 - 1.24 mg/dL Final   Calcium 40/07/2724 8.6 (L)  8.9 - 10.3 mg/dL Final   GFR, Estimated 04/22/2022 >60  >60 mL/min Final   Comment: (NOTE) Calculated using the CKD-EPI Creatinine Equation (2021)    Anion gap 04/22/2022 9  5 - 15 Final   Performed at [facility], [address] [place], [place] [address]   WBC 04/22/2022 3.6 (L)  4.0 - 10.5 K/uL Final   RBC 04/22/2022 3.55 (L)  4.22 - 5.81 MIL/uL Final   Hemoglobin 04/22/2022 8.2 (L)  13.0 - 17.0 g/dL Final   Comment: Reticulocyte Hemoglobin testing may be clinically indicated, consider ordering this additional test [id number]    HCT 04/22/2022 26.6 (L)  39.0 - 52.0 % Final   MCV 04/22/2022 74.9 (L)  80.0 - 100.0 fL Final   MCH 04/22/2022 23.1 (L)  26.0 - 34.0 pg Final   MCHC 04/22/2022 30.8  30.0 - 36.0 g/dL Final   RDW 56/38/7564 20.5 (H)  11.5 - 15.5 % Final    Platelets 04/22/2022 73 (L)  150 -  400 K/uL Final   Comment: Immature Platelet Fraction may be clinically indicated, consider ordering this additional test [id number] REPEATED TO VERIFY    nRBC 04/22/2022 0.0  0.0 - 0.2 % Final   Performed at [facility], [address], [place], [place] [address]   WBC 04/23/2022 3.7 (L)  4.0 - 10.5 K/uL Final   RBC 04/23/2022 3.61 (L)  4.22 - 5.81 MIL/uL Final   Hemoglobin 04/23/2022 8.4 (L)  13.0 - 17.0 g/dL Final   Comment: Reticulocyte Hemoglobin testing may be clinically indicated, consider ordering this additional test [id number]    HCT 04/23/2022 27.0 (L)  39.0 - 52.0 % Final   MCV 04/23/2022 74.8 (L)  80.0 - 100.0 fL Final   MCH 04/23/2022 23.3 (L)  26.0 - 34.0 pg Final   MCHC 04/23/2022 31.1  30.0 - 36.0 g/dL Final   RDW 95/62/1308 20.9 (H)  11.5 - 15.5 % Final   Platelets 04/23/2022 73 (L)  150 - 400 K/uL Final   Comment: Immature Platelet Fraction may be clinically indicated, consider ordering this additional test [id number] CONSISTENT WITH PREVIOUS RESULT REPEATED TO VERIFY    nRBC 04/23/2022 0.0  0.0 - 0.2 % Final   Performed at [facility], [address], [place], [place] [address]   WBC 04/26/2022 3.8 (L)  4.0 - 10.5 K/uL Final   RBC 04/26/2022 3.34 (L)  4.22 - 5.81 MIL/uL Final   Hemoglobin 04/26/2022 7.7 (L)  13.0 - 17.0 g/dL Final   Comment: Reticulocyte Hemoglobin testing may be clinically indicated, consider ordering this additional test [id number]    HCT 04/26/2022 25.1 (L)  39.0 - 52.0 % Final   MCV 04/26/2022 75.1 (L)  80.0 - 100.0 fL Final   MCH 04/26/2022 23.1 (L)  26.0 - 34.0 pg Final   MCHC 04/26/2022 30.7  30.0 - 36.0 g/dL Final   RDW 02/72/5366 20.3 (H)  11.5 - 15.5 % Final   Platelets 04/26/2022 98 (L)  150 - 400 K/uL Final   REPEATED TO VERIFY   nRBC 04/26/2022 0.0  0.0 - 0.2 % Final   Performed at [facility], [address] [place], [place] [address]   Sodium 04/26/2022 140  135 - 145  mmol/L Final   Potassium 04/26/2022 4.5  3.5 - 5.1 mmol/L Final   Chloride 04/26/2022 105  98 - 111 mmol/L Final   CO2 04/26/2022 28  22 - 32 mmol/L Final   Glucose, Bld 04/26/2022 101 (H)  70 - 99 mg/dL Final   Glucose reference range applies only to samples taken after fasting for at least 8 hours.   BUN 04/26/2022 12  8 - 23 mg/dL Final   Creatinine, Ser 04/26/2022 1.04  0.61 - 1.24 mg/dL Final   Calcium 74/25/9563 8.8 (L)  8.9 - 10.3 mg/dL Final   GFR, Estimated 04/26/2022 >60  >60 mL/min Final   Comment: (NOTE) Calculated using the CKD-EPI Creatinine Equation (2021)    Anion gap 04/26/2022 7  5 - 15 Final   Performed at [facility], [address] [place], [place] [address]   WBC 04/27/2022 3.8 (L)  4.0 - 10.5 K/uL Final   RBC 04/27/2022 3.59 (L)  4.22 - 5.81 MIL/uL Final   Hemoglobin 04/27/2022 8.2 (L)  13.0 - 17.0 g/dL Final   Comment: Reticulocyte Hemoglobin testing may be clinically indicated, consider ordering this additional test [id number]    HCT 04/27/2022 27.1 (L)  39.0 - 52.0 %  Final   MCV 04/27/2022 75.5 (L)  80.0 - 100.0 fL Final   MCH 04/27/2022 22.8 (L)  26.0 - 34.0 pg Final   MCHC 04/27/2022 30.3  30.0 - 36.0 g/dL Final   RDW 16/07/9603 20.1 (H)  11.5 - 15.5 % Final   Platelets 04/27/2022 114 (L)  150 - 400 K/uL Final   REPEATED TO VERIFY   nRBC 04/27/2022 0.0  0.0 - 0.2 % Final   Performed at [facility], [address] [place], [place] [address]   Sodium 04/27/2022 139  135 - 145 mmol/L Final   Potassium 04/27/2022 4.2  3.5 - 5.1 mmol/L Final   Chloride 04/27/2022 108  98 - 111 mmol/L Final   CO2 04/27/2022 25  22 - 32 mmol/L Final   Glucose, Bld 04/27/2022 117 (H)  70 - 99 mg/dL Final   Glucose reference range applies only to samples taken after fasting for at least 8 hours.   BUN 04/27/2022 9  8 - 23 mg/dL Final   Creatinine, Ser 04/27/2022 1.16  0.61 - 1.24 mg/dL Final   Calcium 11/91/4782 8.7 (L)  8.9 - 10.3 mg/dL Final   GFR, Estimated  04/27/2022 >60  >60 mL/min Final   Comment: (NOTE) Calculated using the CKD-EPI Creatinine Equation (2021)    Anion gap 04/27/2022 6  5 - 15 Final   Performed at [facility], [address] [place], [place] [address]   WBC 04/28/2022 4.5  4.0 - 10.5 K/uL Final   RBC 04/28/2022 3.39 (L)  4.22 - 5.81 MIL/uL Final   Hemoglobin 04/28/2022 7.7 (L)  13.0 - 17.0 g/dL Final   Comment: Reticulocyte Hemoglobin testing may be clinically indicated, consider ordering this additional test [id number]    HCT 04/28/2022 25.4 (L)  39.0 - 52.0 % Final   MCV 04/28/2022 74.9 (L)  80.0 - 100.0 fL Final   MCH 04/28/2022 22.7 (L)  26.0 - 34.0 pg Final   MCHC 04/28/2022 30.3  30.0 - 36.0 g/dL Final   RDW 69/62/9528 19.9 (H)  11.5 - 15.5 % Final   Platelets 04/28/2022 125 (L)  150 - 400 K/uL Final   REPEATED TO VERIFY   nRBC 04/28/2022 0.0  0.0 - 0.2 % Final   Performed at [facility], [address] [place], [place] [address]   WBC 04/29/2022 4.3  4.0 - 10.5 K/uL Final   RBC 04/29/2022 3.39 (L)  4.22 - 5.81 MIL/uL Final   Hemoglobin 04/29/2022 7.8 (L)  13.0 - 17.0 g/dL Final   Comment: Reticulocyte Hemoglobin testing may be clinically indicated, consider ordering this additional test [id number]    HCT 04/29/2022 25.1 (L)  39.0 - 52.0 % Final   MCV 04/29/2022 74.0 (L)  80.0 - 100.0 fL Final   MCH 04/29/2022 23.0 (L)  26.0 - 34.0 pg Final   MCHC 04/29/2022 31.1  30.0 - 36.0 g/dL Final   RDW 36/64/4034 19.9 (H)  11.5 - 15.5 % Final   Platelets 04/29/2022 125 (L)  150 - 400 K/uL Final   REPEATED TO VERIFY   nRBC 04/29/2022 0.0  0.0 - 0.2 % Final   Performed at [facility], [address] [place], [place] [address]   WBC 05/01/2022 3.6 (L)  4.0 - 10.5 K/uL Final   RBC 05/01/2022 3.13 (L)  4.22 - 5.81 MIL/uL Final   Hemoglobin 05/01/2022 7.1 (L)  13.0 - 17.0 g/dL Final   Comment: Reticulocyte Hemoglobin testing may  be clinically indicated, consider ordering this additional test  [id number]    HCT 05/01/2022 23.0 (L)  39.0 - 52.0 % Final   MCV 05/01/2022 73.5 (L)  80.0 - 100.0 fL Final   MCH 05/01/2022 22.7 (L)  26.0 - 34.0 pg Final   MCHC 05/01/2022 30.9  30.0 - 36.0 g/dL Final   RDW 54/06/8118 19.8 (H)  11.5 - 15.5 % Final   Platelets 05/01/2022 149 (L)  150 - 400 K/uL Final   REPEATED TO VERIFY   nRBC 05/01/2022 0.0  0.0 - 0.2 % Final   Performed at [facility], [address] [place], [place] [address]   Sodium 05/01/2022 137  135 - 145 mmol/L Final   Potassium 05/01/2022 4.0  3.5 - 5.1 mmol/L Final   Chloride 05/01/2022 109  98 - 111 mmol/L Final   CO2 05/01/2022 23  22 - 32 mmol/L Final   Glucose, Bld 05/01/2022 98  70 - 99 mg/dL Final   Glucose reference range applies only to samples taken after fasting for at least 8 hours.   BUN 05/01/2022 14  8 - 23 mg/dL Final   Creatinine, Ser 05/01/2022 0.83  0.61 - 1.24 mg/dL Final   Calcium 95/62/1308 8.4 (L)  8.9 - 10.3 mg/dL Final   GFR, Estimated 05/01/2022 >60  >60 mL/min Final   Comment: (NOTE) Calculated using the CKD-EPI Creatinine Equation (2021)    Anion gap 05/01/2022 5  5 - 15 Final   Performed at [facility], [address] [place], [place] [address]   WBC 05/02/2022 7.8  4.0 - 10.5 K/uL Final   RBC 05/02/2022 3.23 (L)  4.22 - 5.81 MIL/uL Final   Hemoglobin 05/02/2022 7.2 (L)  13.0 - 17.0 g/dL Final   Comment: Reticulocyte Hemoglobin testing may be clinically indicated, consider ordering this additional test [id number]    HCT 05/02/2022 23.6 (L)  39.0 - 52.0 % Final   MCV 05/02/2022 73.1 (L)  80.0 - 100.0 fL Final   MCH 05/02/2022 22.3 (L)  26.0 - 34.0 pg Final   MCHC 05/02/2022 30.5  30.0 - 36.0 g/dL Final   RDW 41/32/4401 19.5 (H)  11.5 - 15.5 % Final   Platelets 05/02/2022 153  150 - 400 K/uL Final   REPEATED TO VERIFY   nRBC 05/02/2022 0.0  0.0 - 0.2 % Final   Performed at [facility], [address] [place], [place] [address]  Admission on 03/31/2022, Discharged on  04/02/2022  Component Date Value Ref Range Status   Sodium 03/31/2022 140  135 - 145 mmol/L Final   Potassium 03/31/2022 3.6  3.5 - 5.1 mmol/L Final   Chloride 03/31/2022 108  98 - 111 mmol/L Final   CO2 03/31/2022 19 (L)  22 - 32 mmol/L Final   Glucose, Bld 03/31/2022 84  70 - 99 mg/dL Final   Glucose reference range applies only to samples taken after fasting for at least 8 hours.   BUN 03/31/2022 9  8 - 23 mg/dL Final   Creatinine, Ser 03/31/2022 0.86  0.61 - 1.24 mg/dL Final   Calcium 36/64/4034 8.6 (L)  8.9 - 10.3 mg/dL Final   Total Protein 74/25/9563 7.2  6.5 - 8.1 g/dL Final   Albumin 87/56/4332 3.5  3.5 - 5.0 g/dL Final   AST 95/18/8416 56 (H)  15 - 41 U/L Final   ALT 03/31/2022 35  0 - 44 U/L Final   Alkaline Phosphatase 03/31/2022 61  38 -  126 U/L Final   Total Bilirubin 03/31/2022 2.0 (H)  0.3 - 1.2 mg/dL Final   GFR, Estimated 03/31/2022 >60  >60 mL/min Final   Comment: (NOTE) Calculated using the CKD-EPI Creatinine Equation (2021)    Anion gap 03/31/2022 13  5 - 15 Final   Performed at [facility], [address] [place], [place] [address]   Alcohol, Ethyl (B) 03/31/2022 203 (H)  <10 mg/dL Final   Comment: (NOTE) Lowest detectable limit for serum alcohol is 10 mg/dL.  For medical purposes only. Performed at [facility], [address] [place], [place] [address]    Salicylate Lvl 03/31/2022 <7.0 (L)  7.0 - 30.0 mg/dL Final   Performed at [facility], [address], [place], [place] [address]   Acetaminophen (Tylenol), Serum 03/31/2022 <10 (L)  10 - 30 ug/mL Final   Comment: (NOTE) Therapeutic concentrations vary significantly. A range of 10-30 ug/mL  may be an effective concentration for many patients. However, some  are best treated at concentrations outside of this range. Acetaminophen concentrations >150 ug/mL at 4 hours after ingestion  and >50 ug/mL at 12 hours after ingestion are often associated with  toxic reactions.  Performed at  [facility], [address], [place], [place] [address]    WBC 03/31/2022 4.4  4.0 - 10.5 K/uL Final   RBC 03/31/2022 5.10  4.22 - 5.81 MIL/uL Final   Hemoglobin 03/31/2022 11.8 (L)  13.0 - 17.0 g/dL Final   HCT 48/54/6270 38.5 (L)  39.0 - 52.0 % Final   MCV 03/31/2022 75.5 (L)  80.0 - 100.0 fL Final   MCH 03/31/2022 23.1 (L)  26.0 - 34.0 pg Final   MCHC 03/31/2022 30.6  30.0 - 36.0 g/dL Final   RDW 35/00/9381 19.4 (H)  11.5 - 15.5 % Final   Platelets 03/31/2022 102 (L)  150 - 400 K/uL Final   Comment: Immature Platelet Fraction may be clinically indicated, consider ordering this additional test [id number] REPEATED TO VERIFY PLATELET COUNT CONFIRMED BY SMEAR    nRBC 03/31/2022 0.0  0.0 - 0.2 % Final   Performed at [facility], [address] [place], [place] [address]   Opiates 03/31/2022 NONE DETECTED  NONE DETECTED Final   Cocaine 03/31/2022 NONE DETECTED  NONE DETECTED Final   Benzodiazepines 03/31/2022 NONE DETECTED  NONE DETECTED Final   Amphetamines 03/31/2022 NONE DETECTED  NONE DETECTED Final   Tetrahydrocannabinol 03/31/2022 NONE DETECTED  NONE DETECTED Final   Barbiturates 03/31/2022 NONE DETECTED  NONE DETECTED Final   Comment: (NOTE) DRUG SCREEN FOR MEDICAL PURPOSES ONLY.  IF CONFIRMATION IS NEEDED FOR ANY PURPOSE, NOTIFY LAB WITHIN 5 DAYS.  LOWEST DETECTABLE LIMITS FOR URINE DRUG SCREEN Drug Class                     Cutoff (ng/mL) Amphetamine and metabolites    1000 Barbiturate and metabolites    200 Benzodiazepine                 200 Tricyclics and metabolites     300 Opiates and metabolites        300 Cocaine and metabolites        300 THC                            50 Performed at [facility], [address], [place], [place] [address]  SARS Coronavirus 2 by RT PCR 03/31/2022 NEGATIVE  NEGATIVE Final   Comment: (NOTE) SARS-CoV-2 target nucleic acids are NOT DETECTED.  The SARS-CoV-2 RNA is generally detectable in upper and  lower respiratory specimens during the acute phase of infection. The lowest concentration of SARS-CoV-2 viral copies this assay can detect is 250 copies / mL. A negative result does not preclude SARS-CoV-2 infection and should not be used as the sole basis for treatment or other patient management decisions.  A negative result may occur with improper specimen collection / handling, submission of specimen other than nasopharyngeal swab, presence of viral mutation(s) within the areas targeted by this assay, and inadequate number of viral copies (<250 copies / mL). A negative result must be combined with clinical observations, patient history, and epidemiological information.  Fact Sheet for Patients:   RoadLapTop.co.za  Fact Sheet for Healthcare Providers: http://kim-miller.com/  This test is not yet approved or                           cleared by the [place] FDA and has been authorized for detection and/or diagnosis of SARS-CoV-2 by FDA under an Emergency Use Authorization (EUA).  This EUA will remain in effect (meaning this test can be used) for the duration of the COVID-19 declaration under Section 564(b)(1) of the Act, 21 U.S.C. section 360bbb-3(b)(1), unless the authorization is terminated or revoked sooner.  Performed at [facility], [address], [place], [place] [address]   Admission on 01/22/2022, Discharged on 01/22/2022  Component Date Value Ref Range Status   Lipase 01/22/2022 44  11 - 51 U/L Final   Performed at [facility], [address], [place], [place] [address]   Sodium 01/22/2022 138  135 - 145 mmol/L Final   Potassium 01/22/2022 3.4 (L)  3.5 - 5.1 mmol/L Final   Chloride 01/22/2022 105  98 - 111 mmol/L Final   CO2 01/22/2022 24  22 - 32 mmol/L Final   Glucose, Bld 01/22/2022 90  70 - 99 mg/dL Final   Glucose reference range applies only to samples taken after fasting for at least  8 hours.   BUN 01/22/2022 10  8 - 23 mg/dL Final   Creatinine, Ser 01/22/2022 0.87  0.61 - 1.24 mg/dL Final   Calcium 09/81/1914 8.9  8.9 - 10.3 mg/dL Final   Total Protein 78/29/5621 7.9  6.5 - 8.1 g/dL Final   Albumin 30/86/5784 3.7  3.5 - 5.0 g/dL Final   AST 69/62/9528 43 (H)  15 - 41 U/L Final   ALT 01/22/2022 31  0 - 44 U/L Final   Alkaline Phosphatase 01/22/2022 74  38 - 126 U/L Final   Total Bilirubin 01/22/2022 2.0 (H)  0.3 - 1.2 mg/dL Final   GFR, Estimated 01/22/2022 >60  >60 mL/min Final   Comment: (NOTE) Calculated using the CKD-EPI Creatinine Equation (2021)    Anion gap 01/22/2022 9  5 - 15 Final   Performed at [facility], [address] [place], [place] [address]   WBC 01/22/2022 4.7  4.0 - 10.5 K/uL Final   RBC 01/22/2022 5.84 (H)  4.22 - 5.81 MIL/uL Final   Hemoglobin 01/22/2022 13.1  13.0 - 17.0 g/dL Final   HCT 40/07/2724 43.9  39.0 - 52.0 % Final   MCV 01/22/2022 75.2 (L)  80.0 - 100.0 fL Final   MCH 01/22/2022 22.4 (L)  26.0 - 34.0 pg Final  MCHC 01/22/2022 29.8 (L)  30.0 - 36.0 g/dL Final   RDW 11/91/4782 19.7 (H)  11.5 - 15.5 % Final   Platelets 01/22/2022 128 (L)  150 - 400 K/uL Final   nRBC 01/22/2022 0.0  0.0 - 0.2 % Final   Performed at [facility], [address] [place], [place] [address]   Color, Urine 01/22/2022 STRAW (A)  YELLOW Final   APPearance 01/22/2022 CLEAR  CLEAR Final   Specific Gravity, Urine 01/22/2022 1.002 (L)  1.005 - 1.030 Final   pH 01/22/2022 5.0  5.0 - 8.0 Final   Glucose, UA 01/22/2022 NEGATIVE  NEGATIVE mg/dL Final   Hgb urine dipstick 01/22/2022 NEGATIVE  NEGATIVE Final   Bilirubin Urine 01/22/2022 NEGATIVE  NEGATIVE Final   Ketones, ur 01/22/2022 NEGATIVE  NEGATIVE mg/dL Final   Protein, ur 30/86/5784 NEGATIVE  NEGATIVE mg/dL Final   Nitrite 69/62/9528 NEGATIVE  NEGATIVE Final   Leukocytes,Ua 01/22/2022 NEGATIVE  NEGATIVE Final   Performed at [facility], [address] [address] [place], [place] [address]   Opiates 01/22/2022 NONE DETECTED  NONE DETECTED Final   Cocaine 01/22/2022 NONE DETECTED  NONE DETECTED Final   Benzodiazepines 01/22/2022 NONE DETECTED  NONE DETECTED Final   Amphetamines 01/22/2022 NONE DETECTED  NONE DETECTED Final   Tetrahydrocannabinol 01/22/2022 NONE DETECTED  NONE DETECTED Final   Barbiturates 01/22/2022 NONE DETECTED  NONE DETECTED Final   Comment: (NOTE) DRUG SCREEN FOR MEDICAL PURPOSES ONLY.  IF CONFIRMATION IS NEEDED FOR ANY PURPOSE, NOTIFY LAB WITHIN 5 DAYS.  LOWEST DETECTABLE LIMITS FOR URINE DRUG SCREEN Drug Class                     Cutoff (ng/mL) Amphetamine and metabolites    1000 Barbiturate and metabolites    200 Benzodiazepine                 200 Tricyclics and metabolites     300 Opiates and metabolites        300 Cocaine and metabolites        300 THC                            50 Performed at [facility], [address] [place], [place] [address]    Alcohol, Ethyl (B) 01/22/2022 134 (H)  <10 mg/dL Final   Comment: (NOTE) Lowest detectable limit for serum alcohol is 10 mg/dL.  For medical purposes only. Performed at [facility], [address] [place], [place] [address]    Specimen Description 01/22/2022    Final                   Value:URINE, CLEAN CATCH Performed at [facility], [address], [place], [place] [address]    Special Requests 01/22/2022    Final                   Value:NONE Performed at [facility], [address] [place], [place] [address]    Culture 01/22/2022    Final                   Value:NO GROWTH Performed at [facility], [address] [place], [place] [address]    Report Status 01/22/2022 01/23/2022 FINAL   Final   Ammonia 01/22/2022 18  9 - 35 umol/L Final   Performed at [facility], [address] [place], [place] [address]   Acetaminophen (Tylenol),  Serum 01/22/2022 <10 (L)  10 - 30 ug/mL Final   Comment: (NOTE) Therapeutic concentrations vary significantly. A range of 10-30 ug/mL  may be an effective concentration for many patients. However, some  are best treated at concentrations outside of this range. Acetaminophen concentrations >150 ug/mL at 4 hours after ingestion  and >50 ug/mL at 12 hours after ingestion are often associated with  toxic reactions.  Performed at [facility], [address], [place], [place] [address]    Salicylate Lvl 01/22/2022 <7.0 (L)  7.0 - 30.0 mg/dL Final   Performed at [facility], [address], [place], [place] [address]  Admission on 01/10/2022, Discharged on 01/10/2022  Component Date Value Ref Range Status   Sodium 01/10/2022 131 (L)  135 - 145 mmol/L Final   Potassium 01/10/2022 4.0  3.5 - 5.1 mmol/L Final   Chloride 01/10/2022 98  98 - 111 mmol/L Final   CO2 01/10/2022 23  22 - 32 mmol/L Final   Glucose, Bld 01/10/2022 84  70 - 99 mg/dL Final   Glucose reference range applies only to samples taken after fasting for at least 8 hours.   BUN 01/10/2022 8  8 - 23 mg/dL Final   Creatinine, Ser 01/10/2022 0.76  0.61 - 1.24 mg/dL Final   Calcium 84/13/2440 9.1  8.9 - 10.3 mg/dL Final   Total Protein 08/13/2535 7.8  6.5 - 8.1 g/dL Final   Albumin 64/40/3474 3.8  3.5 - 5.0 g/dL Final   AST 25/95/6387 55 (H)  15 - 41 U/L Final   ALT 01/10/2022 36  0 - 44 U/L Final   Alkaline Phosphatase 01/10/2022 76  38 - 126 U/L Final   Total Bilirubin 01/10/2022 3.3 (H)  0.3 - 1.2 mg/dL Final   GFR, Estimated 01/10/2022 >60  >60 mL/min Final   Comment: (NOTE) Calculated using the CKD-EPI Creatinine Equation (2021)    Anion gap 01/10/2022 10  5 - 15 Final   Performed at [facility], [address], [place], [place] [address]   WBC 01/10/2022 5.7  4.0 - 10.5 K/uL Final   RBC 01/10/2022 5.16  4.22 - 5.81 MIL/uL Final   Hemoglobin 01/10/2022 11.8 (L)  13.0 - 17.0  g/dL Final   HCT 29/51/8841 39.0  39.0 - 52.0 % Final   MCV 01/10/2022 75.6 (L)  80.0 - 100.0 fL Final   MCH 01/10/2022 22.9 (L)  26.0 - 34.0 pg Final   MCHC 01/10/2022 30.3  30.0 - 36.0 g/dL Final   RDW 66/03/3015 18.4 (H)  11.5 - 15.5 % Final   Platelets 01/10/2022 138 (L)  150 - 400 K/uL Final   REPEATED TO VERIFY   nRBC 01/10/2022 0.0  0.0 - 0.2 % Final   Neutrophils Relative % 01/10/2022 61  % Final   Neutro Abs 01/10/2022 3.5  1.7 - 7.7 K/uL Final   Lymphocytes Relative 01/10/2022 27  % Final   Lymphs Abs 01/10/2022 1.5  0.7 - 4.0 K/uL Final   Monocytes Relative 01/10/2022 10  % Final   Monocytes Absolute 01/10/2022 0.6  0.1 - 1.0 K/uL Final   Eosinophils Relative 01/10/2022 1  % Final   Eosinophils Absolute 01/10/2022 0.0  0.0 - 0.5 K/uL Final   Basophils Relative 01/10/2022 1  % Final   Basophils Absolute 01/10/2022 0.1  0.0 - 0.1 K/uL Final   Immature Granulocytes 01/10/2022  0  % Final   Abs Immature Granulocytes 01/10/2022 0.02  0.00 - 0.07 K/uL Final   Performed at [facility], [address], [place], [place] [address]   Opiates 01/10/2022 NONE DETECTED  NONE DETECTED Final   Cocaine 01/10/2022 NONE DETECTED  NONE DETECTED Final   Benzodiazepines 01/10/2022 NONE DETECTED  NONE DETECTED Final   Amphetamines 01/10/2022 NONE DETECTED  NONE DETECTED Final   Tetrahydrocannabinol 01/10/2022 NONE DETECTED  NONE DETECTED Final   Barbiturates 01/10/2022 NONE DETECTED  NONE DETECTED Final   Comment: (NOTE) DRUG SCREEN FOR MEDICAL PURPOSES ONLY.  IF CONFIRMATION IS NEEDED FOR ANY PURPOSE, NOTIFY LAB WITHIN 5 DAYS.  LOWEST DETECTABLE LIMITS FOR URINE DRUG SCREEN Drug Class                     Cutoff (ng/mL) Amphetamine and metabolites    1000 Barbiturate and metabolites    200 Benzodiazepine                 200 Tricyclics and metabolites     300 Opiates and metabolites        300 Cocaine and metabolites        300 THC                            50 Performed at [facility], [address] [place], [place] [address]    Color, Urine 01/10/2022 YELLOW  YELLOW Final   APPearance 01/10/2022 CLEAR  CLEAR Final   Specific Gravity, Urine 01/10/2022 1.003 (L)  1.005 - 1.030 Final   pH 01/10/2022 5.0  5.0 - 8.0 Final   Glucose, UA 01/10/2022 NEGATIVE  NEGATIVE mg/dL Final   Hgb urine dipstick 01/10/2022 SMALL (A)  NEGATIVE Final   Bilirubin Urine 01/10/2022 NEGATIVE  NEGATIVE Final   Ketones, ur 01/10/2022 5 (A)  NEGATIVE mg/dL Final   Protein, ur 09/81/1914 NEGATIVE  NEGATIVE mg/dL Final   Nitrite 78/29/5621 NEGATIVE  NEGATIVE Final   Leukocytes,Ua 01/10/2022 NEGATIVE  NEGATIVE Final   WBC, UA 01/10/2022 0-5  0 - 5 WBC/hpf Final   Bacteria, UA 01/10/2022 NONE SEEN  NONE SEEN Final   Squamous Epithelial / LPF 01/10/2022 0-5  0 - 5 Final   Performed at [facility], [address] [place], [place] [address]   Lipase 01/10/2022 37  11 - 51 U/L Final   Performed at [facility], [address], [place], [place] [address]   Total CK 01/10/2022 163  49 - 397 U/L Final   Performed at [facility], [address], [place], [place] [address]  There may be more visits with results that are not included.    Blood Alcohol level:  Lab Results  Component Value Date   ETH <10 06/11/2022   ETH 194 (H) 05/29/2022    Metabolic Disorder Labs: Lab Results  Component Value Date   HGBA1C 5.2 05/24/2022   MPG 102.54 05/24/2022   MPG 108 05/06/2009   No results found for: "PROLACTIN" Lab Results  Component Value Date   CHOL 140 05/31/2022   TRIG 91 05/31/2022   HDL 54 05/31/2022   CHOLHDL 2.6 05/31/2022   VLDL 18 05/31/2022   LDLCALC 68 05/31/2022   LDLCALC NOT CALCULATED 05/24/2022    Therapeutic Lab Levels: Lab Results  Component Value Date   LITHIUM <0.06 (L) 02/09/2021   No results found for: "  VALPROATE" Lab Results  Component Value Date   CBMZ <0.3 (L) 11/19/2010    Physical Findings   AUDIT    Flowsheet Row ED  from 05/29/2022 in [facility]  Alcohol Use Disorder Identification Test Final Score (AUDIT) 14      CAGE-AID    Flowsheet Row ED to Hosp-Admission (Discharged) from 04/20/2022 in [facility]  SURGICAL ED to Hosp-Admission (Discharged) from 02/07/2021 in [facility]  CAGE-AID Score 4 0      PHQ2-9    Flowsheet Row ED from 06/10/2022 in [facility] ED from 05/29/2022 in [facility] ED from 01/12/2022 in [facility] ED from 01/05/2022 in [facility] EMERGENCY DEPARTMENT  PHQ-2 Total Score 6 6 2 5   PHQ-9 Total Score 21 17 7 24       Flowsheet Row ED from 06/10/2022 in [facility] ED from 05/29/2022 in [facility] ED from 05/24/2022 in [facility]  C-SSRS RISK CATEGORY Moderate Risk No Risk High Risk        Musculoskeletal  Strength & Muscle Tone: within normal limits Gait & Station: normal Patient leans: N/A  Psychiatric Specialty Exam  Presentation  General Appearance: Appropriate for Environment; Casual  Eye Contact:Fair  Speech:Clear and Coherent  Speech Volume:Normal  Handedness:Right   Mood and Affect  Mood:Euthymic  Affect:Appropriate   Thought Process  Thought Processes:Coherent  Descriptions of Associations:Intact  Orientation:Full (Time, Place and Person)  Thought Content:Logical  Diagnosis of Schizophrenia or Schizoaffective disorder in past: No    Hallucinations:Hallucinations: None  Ideas of Reference:None  Suicidal Thoughts:Suicidal Thoughts: No  Homicidal Thoughts:Homicidal Thoughts: No   Sensorium  Memory:Immediate Fair; Recent Fair  Judgment:Fair  Insight:Fair   Executive Functions  Concentration:Fair  Attention Span:Fair  Recall:Fair  Fund of  Knowledge:Fair  Language:Fair   Psychomotor Activity  Psychomotor Activity:Psychomotor Activity: Normal   Assets  Assets:Desire for Improvement; Resilience   Sleep  Sleep:Sleep: Poor   No data recorded  Physical Exam  Physical Exam Constitutional:      Appearance: Normal appearance.  HENT:     Head: Normocephalic and atraumatic.  Pulmonary:     Effort: Pulmonary effort is normal.  Neurological:     Mental Status: He is alert and oriented to person, place, and time.    Review of Systems  Psychiatric/Behavioral:  Negative for suicidal ideas. The patient is nervous/anxious and has insomnia.    Blood pressure (!) 150/83, pulse 66, temperature (!) 97.5 F (36.4 C), temperature source Oral, resp. rate 20, SpO2 99 %. There is no height or weight on file to calculate BMI.  Treatment Plan Summary: Daily contact with patient to assess and evaluate symptoms and progress in treatment and Medication management   Patient symptoms appear to be improving, regarding EtOH withdrawal.  Patient will continue scheduled titration. 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

## 2022-06-13 NOTE — ED Notes (Signed)
Pt sleeping@this time. Breathing even and unlabored will continue to monitor for safety 

## 2022-06-13 NOTE — BH IP Treatment Plan (Signed)
Interdisciplinary Treatment and Diagnostic Plan Update  06/13/2022 Time of Session: 11:00AM   [name] MRN: [id number]  Diagnosis:  Final diagnoses:  Alcohol use disorder  Uncomplicated alcohol dependence (HCC)  Suicidal ideation  Substance induced mood disorder (HCC)   Current Medications:  Current Facility-Administered Medications  Medication Dose Route Frequency Provider Last Rate Last Admin   acetaminophen (TYLENOL) tablet 650 mg  650 mg Oral Q6H PRN White, Patrice L, NP   650 mg at 06/12/22 2107   alum & mag hydroxide-simeth (MAALOX/MYLANTA) 200-200-20 MG/5ML suspension 30 mL  30 mL Oral Q4H PRN White, Patrice L, NP       amLODipine (NORVASC) tablet 10 mg  10 mg Oral Daily Park Pope, MD   10 mg at 06/13/22 1009   ferrous sulfate tablet 325 mg  325 mg Oral Q breakfast White, Patrice L, NP   325 mg at 06/13/22 1009   gabapentin (NEURONTIN) capsule 300 mg  300 mg Oral TID Park Pope, MD   300 mg at 06/13/22 1009   hydrOXYzine (ATARAX) tablet 25 mg  25 mg Oral TID Eliseo Gum B, MD   25 mg at 06/13/22 1009   LORazepam (ATIVAN) tablet 1 mg  1 mg Oral BID White, Patrice L, NP   1 mg at 06/13/22 1008   Followed by   Melene Muller ON 06/14/2022] LORazepam (ATIVAN) tablet 1 mg  1 mg Oral Daily White, Patrice L, NP       magnesium hydroxide (MILK OF MAGNESIA) suspension 30 mL  30 mL Oral Daily PRN White, Patrice L, NP       multivitamin with minerals tablet 1 tablet  1 tablet Oral Daily White, Patrice L, NP   1 tablet at 06/13/22 1009   polyvinyl alcohol (LIQUIFILM TEARS) 1.4 % ophthalmic solution 1 drop  1 drop Both Eyes PRN Nwoko, Uchenna E, PA   1 drop at 06/12/22 2115   QUEtiapine (SEROQUEL) tablet 200 mg  200 mg Oral Seabron Spates, MD       thiamine (VITAMIN B1) tablet 100 mg  100 mg Oral Daily White, Patrice L, NP   100 mg at 06/13/22 1009   Current Outpatient Medications  Medication Sig Dispense Refill   amLODipine (NORVASC) 5 MG tablet Take 1 tablet (5 mg total) by mouth daily.  (Patient not taking: Reported on 06/10/2022) 30 tablet 0   ferrous sulfate 325 (65 FE) MG tablet Take 1 tablet (325 mg total) by mouth daily with breakfast. (Patient not taking: Reported on 06/10/2022) 30 tablet 0   gabapentin (NEURONTIN) 400 MG capsule Take 1 capsule (400 mg total) by mouth 3 (three) times daily. (Patient not taking: Reported on 06/10/2022) 90 capsule 0   QUEtiapine (SEROQUEL) 200 MG tablet Take 1 tablet (200 mg total) by mouth at bedtime. (Patient not taking: Reported on 06/10/2022) 30 tablet 0   PTA Medications: Prior to Admission medications   Medication Sig Start Date End Date Taking? Authorizing Provider  amLODipine (NORVASC) 5 MG tablet Take 1 tablet (5 mg total) by mouth daily. Patient not taking: Reported on 06/10/2022 06/02/22 07/02/22  Park Pope, MD  ferrous sulfate 325 (65 FE) MG tablet Take 1 tablet (325 mg total) by mouth daily with breakfast. Patient not taking: Reported on 06/10/2022 06/02/22 07/02/22  Park Pope, MD  gabapentin (NEURONTIN) 400 MG capsule Take 1 capsule (400 mg total) by mouth 3 (three) times daily. Patient not taking: Reported on 06/10/2022 06/01/22 07/01/22  Park Pope, MD  QUEtiapine (SEROQUEL)  200 MG tablet Take 1 tablet (200 mg total) by mouth at bedtime. Patient not taking: Reported on 06/10/2022 06/01/22 07/01/22  Park Pope, MD    Patient Stressors: Medication change or noncompliance    Patient Strengths: Motivation for treatment/growth   Treatment Modalities: Medication Management, Group therapy, Case management,  1 to 1 session with clinician, Psychoeducation, Recreational therapy.   Physician Treatment Plan for Primary and Secondary Diagnosis:  Final diagnoses:  Alcohol use disorder  Uncomplicated alcohol dependence (HCC)  Suicidal ideation  Substance induced mood disorder (HCC)   Long Term Goal(s): Improvement in symptoms so as ready for discharge  Short Term Goals: Patient will attend at least of 50% of the groups daily. Pt will  complete the PHQ9 on admission, day 3 and discharge. Patient will take medications as prescribed daily.  Medication Management: Evaluate patient's response, side effects, and tolerance of medication regimen.  Therapeutic Interventions: 1 to 1 sessions, Unit Group sessions and Medication administration.  Evaluation of Outcomes: Progressing  LCSW Treatment Plan for Primary Diagnosis:  Final diagnoses:  Alcohol use disorder  Uncomplicated alcohol dependence (HCC)  Suicidal ideation  Substance induced mood disorder (HCC)    Long Term Goal(s): Safe transition to appropriate next level of care at discharge.  Short Term Goals: Facilitate acceptance of mental health diagnosis and concerns through verbal commitment to aftercare plan and appointments at discharge. and Identify minimum of 2 triggers associated with mental health/substance abuse issues with treatment team members.  Therapeutic Interventions: Assess for all discharge needs, 1 to 1 time with Child psychotherapist, Explore available resources and support systems, Assess for adequacy in community support network, Educate family and significant other(s) on suicide prevention, Complete Psychosocial Assessment, Interpersonal group therapy.  Evaluation of Outcomes: Progressing   Progress in Treatment: Attending groups: Yes. Participating in groups: Yes. Taking medication as prescribed: Yes. Toleration medication: Yes. Family/Significant other contact made: No, will contact:  no one at this time Patient understands diagnosis: Yes. Discussing patient identified problems/goals with staff: Yes. Medical problems stabilized or resolved: Yes. Denies suicidal/homicidal ideation: Yes. Issues/concerns per patient self-inventory: No. Other: None  New problem(s) identified: No, Describe:  None   New Short Term/Long Term Goal(s): [name] reports his long term goal  is to participate in a long-term residential treatment program.   Patient Goals:  "I  just really needed help"  Discharge Plan or Barriers: [name] expressed interest in discharging to a residential treatment facility. LCSW will continue to follow up for possible resources/referrals.   Reason for Continuation of Hospitalization: Medication stabilization  Estimated Length of Stay: 3-5 days  Last 3 [place] Suicide Severity Risk Score: Flowsheet Row ED from 06/10/2022 in [facility] ED from 05/29/2022 in [facility] ED from 05/24/2022 in [facility]  C-SSRS RISK CATEGORY Moderate Risk No Risk High Risk       Last [facility] 2/9 Scores:    06/11/2022   10:21 AM 05/30/2022    3:14 PM 01/12/2022    9:47 AM  Depression screen PHQ 2/9  Decreased Interest 3 3 1   Down, Depressed, Hopeless 3 3 1   PHQ - 2 Score 6 6 2   Altered sleeping 3 3 1   Tired, decreased energy  3 1  Change in appetite 3 1 0  Feeling bad or failure about yourself  3 1 1   Trouble concentrating 3 1 1   Moving slowly or fidgety/restless 0 1 0  Suicidal thoughts 3 1 1   PHQ-9 Score  21 17 7   Difficult doing work/chores Very difficult Somewhat difficult Somewhat difficult    Scribe for Treatment Team: , Maeola Sarah 06/13/2022 2:57 PM

## 2022-06-13 NOTE — Progress Notes (Signed)
Patient attending AA meeting.

## 2022-06-13 NOTE — ED Notes (Signed)
Patient is resting in bed without issue or complaint.  Will monitor and meet needs as they arise.

## 2022-06-14 DIAGNOSIS — F1020 Alcohol dependence, uncomplicated: Secondary | ICD-10-CM | POA: Diagnosis not present

## 2022-06-14 MED ORDER — AMLODIPINE BESYLATE 10 MG PO TABS
10.0000 mg | ORAL_TABLET | Freq: Every day | ORAL | 0 refills | Status: DC
Start: 2022-06-15 — End: 2023-01-11

## 2022-06-14 MED ORDER — HYDROXYZINE HCL 25 MG PO TABS: 25.0000 mg | ORAL_TABLET | Freq: Three times a day (TID) | ORAL | 0 refills | Status: DC

## 2022-06-14 MED ORDER — FERROUS SULFATE 325 (65 FE) MG PO TABS
325.0000 mg | ORAL_TABLET | Freq: Every day | ORAL | 0 refills | Status: DC
Start: 2022-06-14 — End: 2023-01-11

## 2022-06-14 MED ORDER — QUETIAPINE FUMARATE 200 MG PO TABS
200.0000 mg | ORAL_TABLET | Freq: Every day | ORAL | 0 refills | Status: DC
Start: 2022-06-14 — End: 2022-07-08

## 2022-06-14 MED ORDER — GABAPENTIN 300 MG PO CAPS
300.0000 mg | ORAL_CAPSULE | Freq: Three times a day (TID) | ORAL | 0 refills | Status: DC
Start: 2022-06-14 — End: 2022-07-08

## 2022-06-14 NOTE — ED Notes (Addendum)
[name] denied going to the AA group.

## 2022-06-14 NOTE — ED Notes (Signed)
Patient is resting quietly in room. He did not want to attend the AA meeting but did not give a reason. No complaints at this time, will continue to monitor for safety.

## 2022-06-14 NOTE — ED Notes (Signed)
Patient was awake for breakfast and has now returned to bed.  He is calm, avoidant and with mild irritability.  Patient is without withdrawal symptoms, makes needs known and is comfortable on unit.  He states he is staying until his check comes and then wants to be discharged.  Patient is tolerating taper.  Will monitor.

## 2022-06-14 NOTE — Discharge Instructions (Addendum)
Take all medications as prescribed by his/her mental healthcare provider. ?Report any adverse effects and or reactions from the medicines to your outpatient provider promptly. ?Do not engage in alcohol and or illegal drug use while on prescription medicines. ?In the event of worsening symptoms, call the crisis hotline, 911 and or go to the nearest ED for appropriate evaluation and treatment of symptoms. ?follow-up with your primary care provider for your other medical issues, concerns and or health care needs. ? ?

## 2022-06-14 NOTE — ED Notes (Signed)
Pt sleeping@this time. Breathing even and unlabored. Will continue to monitor for safety 

## 2022-06-14 NOTE — ED Provider Notes (Signed)
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 Providers: SeriousBroker.it  This test is not yet approved or cleared by the [place] FDA and has been authorized for detection and/or diagnosis of SARS-CoV-2 by FDA under an Emergency Use Authorization (EUA). This EUA will remain in effect (meaning this test can be used) for the duration of the COVID-19 declaration under  Section 564(b)(1) of the Act, 21 U.S.C. section 360bbb-3(b)(1), unless the authorization is terminated or revoked.  Performed at [facility], [address] [place], [place] [address]    WBC 05/24/2022 5.3  4.0 - 10.5 K/uL Final   RBC 05/24/2022 3.98 (L)  4.22 - 5.81 MIL/uL Final   Hemoglobin 05/24/2022 8.4 (L)  13.0 - 17.0 g/dL Final   Comment: Reticulocyte Hemoglobin testing may be clinically indicated, consider ordering this additional test [id number]    HCT 05/24/2022 27.4 (L)  39.0 - 52.0 % Final   MCV 05/24/2022 68.8 (L)  80.0 - 100.0 fL Final   MCH 05/24/2022 21.1 (L)  26.0 - 34.0 pg Final   MCHC 05/24/2022 30.7  30.0 - 36.0 g/dL Final   RDW 50/06/3817 19.3 (H)  11.5 - 15.5 % Final   Platelets 05/24/2022 149 (L)  150 - 400 K/uL Final   REPEATED TO VERIFY   nRBC 05/24/2022 0.0  0.0 - 0.2 % Final   Neutrophils Relative % 05/24/2022 39  % Final   Neutro Abs 05/24/2022 2.0  1.7 - 7.7 K/uL Final   Lymphocytes Relative 05/24/2022 45  % Final   Lymphs Abs 05/24/2022 2.4  0.7 - 4.0 K/uL Final   Monocytes Relative 05/24/2022 11  % Final   Monocytes Absolute 05/24/2022 0.6  0.1 - 1.0 K/uL Final   Eosinophils Relative 05/24/2022 3  % Final   Eosinophils Absolute 05/24/2022 0.2  0.0 - 0.5 K/uL Final   Basophils Relative 05/24/2022 2  % Final   Basophils Absolute 05/24/2022 0.1  0.0 - 0.1 K/uL Final   Immature Granulocytes 05/24/2022 0  % Final   Abs Immature Granulocytes 05/24/2022 0.01  0.00 - 0.07 K/uL Final   Performed at [facility], [address], [place], [place] [address]   Sodium 05/24/2022 139  135 - 145 mmol/L Final   Potassium 05/24/2022 3.9  3.5 - 5.1 mmol/L Final   Chloride 05/24/2022 108  98 - 111 mmol/L Final   CO2 05/24/2022 21 (L)  22 - 32 mmol/L Final   Glucose, Bld 05/24/2022 100 (H)  70 - 99 mg/dL Final   Glucose reference range applies only to samples taken after fasting for at least 8 hours.   BUN 05/24/2022 12  8 - 23 mg/dL Final    Creatinine, Ser 05/24/2022 1.04  0.61 - 1.24 mg/dL Final   Calcium 16/96/7893 9.0  8.9 - 10.3 mg/dL Final   Total Protein 81/10/7508 7.4  6.5 - 8.1 g/dL Final   Albumin 25/85/2778 3.6  3.5 - 5.0 g/dL Final   AST 24/23/5361 36  15 - 41 U/L Final   ALT 05/24/2022 26  0 - 44 U/L Final   Alkaline Phosphatase 05/24/2022 77  38 - 126 U/L Final   Total Bilirubin 05/24/2022 1.2  0.3 - 1.2 mg/dL Final   GFR, Estimated 05/24/2022 >60  >60 mL/min Final   Comment: (NOTE) Calculated using the CKD-EPI Creatinine Equation (2021)    Anion gap 05/24/2022 10  5 - 15 Final   Performed at [facility], [address], [place], [place] [address]   Hgb A1c MFr Bld 05/24/2022 5.2  4.8 - 5.6 % Final   Comment: (NOTE) Pre diabetes:          5.7%-6.4%  Diabetes:              >6.4%  Glycemic control for   <7.0% adults with diabetes    Mean Plasma Glucose 05/24/2022 102.54  mg/dL Final   Performed at [facility], [address] [place], [place] [address]   Alcohol, Ethyl (B) 05/24/2022 99 (H)  <10 mg/dL Final   Comment: (NOTE) Lowest detectable limit for serum alcohol is 10 mg/dL.  For  medical purposes only. Performed at [facility], [address], [place], [place] [address]    Hepatitis B Surface Ag 05/24/2022 NON REACTIVE  NON REACTIVE Final   HCV Ab 05/24/2022 Reactive (A)  NON REACTIVE Final   Comment: (NOTE) The CDC recommends that a Reactive HCV antibody result be followed up  with a HCV Nucleic Acid Amplification test.     Hep A IgM 05/24/2022 NON REACTIVE  NON REACTIVE Final   Hep B C IgM 05/24/2022 NON REACTIVE  NON REACTIVE Final   Performed at [facility], [address], [place], [place] [address]   POC Amphetamine UR 05/25/2022 None Detected  NONE DETECTED (Cut Off Level 1000 ng/mL) Final   POC Secobarbital (BAR) 05/25/2022 None Detected  NONE DETECTED (Cut Off Level 300 ng/mL) Final   POC Buprenorphine (BUP) 05/25/2022 None Detected  NONE DETECTED (Cut Off  Level 10 ng/mL) Final   POC Oxazepam (BZO) 05/25/2022 Positive (A)  NONE DETECTED (Cut Off Level 300 ng/mL) Final   POC Cocaine UR 05/25/2022 Positive (A)  NONE DETECTED (Cut Off Level 300 ng/mL) Final   POC Methamphetamine UR 05/25/2022 None Detected  NONE DETECTED (Cut Off Level 1000 ng/mL) Final   POC Morphine 05/25/2022 None Detected  NONE DETECTED (Cut Off Level 300 ng/mL) Final   POC Methadone UR 05/25/2022 None Detected  NONE DETECTED (Cut Off Level 300 ng/mL) Final   POC Oxycodone UR 05/25/2022 None Detected  NONE DETECTED (Cut Off Level 100 ng/mL) Final   POC Marijuana UR 05/25/2022 None Detected  NONE DETECTED (Cut Off Level 50 ng/mL) Final   SARSCOV2ONAVIRUS 2 AG 05/24/2022 NEGATIVE  NEGATIVE Final   Comment: (NOTE) SARS-CoV-2 antigen NOT DETECTED.   Negative results are presumptive.  Negative results do not preclude SARS-CoV-2 infection and should not be used as the sole basis for treatment or other patient management decisions, including infection  control decisions, particularly in the presence of clinical signs and  symptoms consistent with COVID-19, or in those who have been in contact with the virus.  Negative results must be combined with clinical observations, patient history, and epidemiological information. The expected result is Negative.  Fact Sheet for Patients: https://www.jennings-kim.com/  Fact Sheet for Healthcare Providers: https://alexander-rogers.biz/  This test is not yet approved or cleared by the [place] FDA and  has been authorized for detection and/or diagnosis of SARS-CoV-2 by FDA under an Emergency Use Authorization (EUA).  This EUA will remain in effect (meaning this test can be used) for the duration of  the COV                          ID-19 declaration under Section 564(b)(1) of the Act, 21 U.S.C. section 360bbb-3(b)(1), unless the authorization is terminated or revoked sooner.     Cholesterol 05/24/2022 141   0 - 200 mg/dL Final   Triglycerides 09/81/1914 94  <150 mg/dL Final   HDL 78/29/5621 57  >40 mg/dL Final   Total CHOL/HDL Ratio 05/24/2022 2.5  RATIO Final   VLDL 05/24/2022 19  0 - 40 mg/dL Final   LDL Cholesterol 05/24/2022 NOT CALCULATED  0 - 99 mg/dL Final   Performed at [facility], [address], [place], [place] [address]   TSH 05/24/2022 2.834  0.350 - 4.500 uIU/mL Final   Comment: Performed by a 3rd Generation assay with a functional sensitivity of <=0.01 uIU/mL. Performed at [facility], [address] [place], [place] [address]   Admission on 05/10/2022, Discharged on 05/11/2022  Component Date Value Ref Range Status   SARS Coronavirus 2 by RT PCR 05/10/2022 NEGATIVE  NEGATIVE Final   Comment: (NOTE) SARS-CoV-2 target nucleic acids are NOT DETECTED.  The SARS-CoV-2 RNA is generally detectable in upper respiratory specimens during the acute phase of infection. The lowest concentration of SARS-CoV-2 viral copies this assay can detect is 138 copies/mL. A negative result does not preclude SARS-Cov-2 infection and should not be used as the sole basis for treatment or other patient management decisions. A negative result may occur with  improper specimen collection/handling, submission of specimen other than nasopharyngeal swab, presence of viral mutation(s) within the areas targeted by this assay, and inadequate number of viral copies(<138 copies/mL). A negative result must be combined with clinical observations, patient history, and epidemiological information. The expected result is Negative.  Fact Sheet for Patients:  BloggerCourse.com  Fact Sheet for Healthcare Providers:  SeriousBroker.it  This test is no                          t yet approved or cleared by the [place] FDA and  has been authorized for detection and/or diagnosis of SARS-CoV-2 by FDA under an Emergency Use Authorization (EUA). This  EUA will remain  in effect (meaning this test can be used) for the duration of the COVID-19 declaration under Section 564(b)(1) of the Act, 21 U.S.C.section 360bbb-3(b)(1), unless the authorization is terminated  or revoked sooner.       Influenza A by PCR 05/10/2022 NEGATIVE  NEGATIVE Final   Influenza B by PCR 05/10/2022 NEGATIVE  NEGATIVE Final   Comment: (NOTE) The Xpert Xpress SARS-CoV-2/FLU/RSV plus assay is intended as an aid in the diagnosis of influenza from Nasopharyngeal swab specimens and should not be used as a sole basis for treatment. Nasal washings and aspirates are unacceptable for Xpert Xpress SARS-CoV-2/FLU/RSV testing.  Fact Sheet for Patients: BloggerCourse.com  Fact Sheet for Healthcare Providers: SeriousBroker.it  This test is not yet approved or cleared by the [place] FDA and has been authorized for detection and/or diagnosis of SARS-CoV-2 by FDA under an Emergency Use Authorization (EUA). This EUA will remain in effect (meaning this test can be used) for the duration of the COVID-19 declaration under Section 564(b)(1) of the Act, 21 U.S.C. section 360bbb-3(b)(1), unless the authorization is terminated or revoked.  Performed at [facility], [address] [place], [place] [address]    Sodium 05/10/2022 137  135 - 145 mmol/L Final   Potassium 05/10/2022 3.7  3.5 - 5.1 mmol/L Final   Chloride 05/10/2022 106  98 - 111 mmol/L Final   CO2 05/10/2022 23  22 - 32 mmol/L Final   Glucose, Bld 05/10/2022 90  70 - 99 mg/dL Final   Glucose reference range applies only to samples taken after fasting for at least 8 hours.   BUN 05/10/2022 13  8 - 23 mg/dL Final   Creatinine, Ser 05/10/2022 1.04  0.61 - 1.24 mg/dL Final   Calcium 09/81/1914 8.8 (L)  8.9 - 10.3 mg/dL Final   Total Protein 78/29/5621 7.8  6.5 - 8.1 g/dL Final   Albumin 30/86/5784 3.6  3.5 - 5.0 g/dL Final   AST 69/62/9528  30  15 - 41 U/L Final   ALT 05/10/2022 21  0 - 44 U/L Final   Alkaline Phosphatase 05/10/2022 62  38 - 126 U/L Final  Total Bilirubin 05/10/2022 0.8  0.3 - 1.2 mg/dL Final   GFR, Estimated 05/10/2022 >60  >60 mL/min Final   Comment: (NOTE) Calculated using the CKD-EPI Creatinine Equation (2021)    Anion gap 05/10/2022 8  5 - 15 Final   Performed at [facility], [address] [place], [place] [address]   Alcohol, Ethyl (B) 05/10/2022 156 (H)  <10 mg/dL Final   Comment: (NOTE) Lowest detectable limit for serum alcohol is 10 mg/dL.  For medical purposes only. Performed at [facility], [address], [place], [place] [address]    Opiates 05/10/2022 POSITIVE (A)  NONE DETECTED Final   Cocaine 05/10/2022 POSITIVE (A)  NONE DETECTED Final   Benzodiazepines 05/10/2022 POSITIVE (A)  NONE DETECTED Final   Amphetamines 05/10/2022 NONE DETECTED  NONE DETECTED Final   Tetrahydrocannabinol 05/10/2022 NONE DETECTED  NONE DETECTED Final   Barbiturates 05/10/2022 NONE DETECTED  NONE DETECTED Final   Comment: (NOTE) DRUG SCREEN FOR MEDICAL PURPOSES ONLY.  IF CONFIRMATION IS NEEDED FOR ANY PURPOSE, NOTIFY LAB WITHIN 5 DAYS.  LOWEST DETECTABLE LIMITS FOR URINE DRUG SCREEN Drug Class                     Cutoff (ng/mL) Amphetamine and metabolites    1000 Barbiturate and metabolites    200 Benzodiazepine                 200 Tricyclics and metabolites     300 Opiates and metabolites        300 Cocaine and metabolites        300 THC                            50 Performed at [facility], [address] [place], [place] [address]    WBC 05/10/2022 4.5  4.0 - 10.5 K/uL Final   RBC 05/10/2022 3.77 (L)  4.22 - 5.81 MIL/uL Final   Hemoglobin 05/10/2022 8.3 (L)  13.0 - 17.0 g/dL Final   Comment: Reticulocyte Hemoglobin testing may be clinically indicated, consider ordering this additional test [id number]    HCT 05/10/2022 27.3 (L)   39.0 - 52.0 % Final   MCV 05/10/2022 72.4 (L)  80.0 - 100.0 fL Final   MCH 05/10/2022 22.0 (L)  26.0 - 34.0 pg Final   MCHC 05/10/2022 30.4  30.0 - 36.0 g/dL Final   RDW 62/13/0865 19.8 (H)  11.5 - 15.5 % Final   Platelets 05/10/2022 162  150 - 400 K/uL Final   nRBC 05/10/2022 0.0  0.0 - 0.2 % Final   Neutrophils Relative % 05/10/2022 55  % Final   Neutro Abs 05/10/2022 2.5  1.7 - 7.7 K/uL Final   Lymphocytes Relative 05/10/2022 32  % Final   Lymphs Abs 05/10/2022 1.4  0.7 - 4.0 K/uL Final   Monocytes Relative 05/10/2022 10  % Final   Monocytes Absolute 05/10/2022 0.4  0.1 - 1.0 K/uL Final   Eosinophils Relative 05/10/2022 2  % Final   Eosinophils Absolute 05/10/2022 0.1  0.0 - 0.5 K/uL Final   Basophils Relative 05/10/2022 1  % Final   Basophils Absolute 05/10/2022 0.1  0.0 - 0.1 K/uL Final   Immature Granulocytes 05/10/2022 0  % Final   Abs Immature Granulocytes 05/10/2022 0.01  0.00 - 0.07 K/uL Final   Performed at [facility], [address], [place], [place] [address]  Salicylate Lvl 05/10/2022 <7.0 (L)  7.0 - 30.0 mg/dL Final   Performed at [facility], [address] [place], [place] [address]   Acetaminophen (Tylenol), Serum 05/10/2022 <10 (L)  10 - 30 ug/mL Final   Comment: (NOTE) Therapeutic concentrations vary significantly. A range of 10-30 ug/mL  may be an effective concentration for many patients. However, some  are best treated at concentrations outside of this range. Acetaminophen concentrations >150 ug/mL at 4 hours after ingestion  and >50 ug/mL at 12 hours after ingestion are often associated with  toxic reactions.  Performed at [facility], [address], [place], [place] [address]    Magnesium 05/10/2022 2.0  1.7 - 2.4 mg/dL Final   Performed at [facility], [address] [place], [place] [address]   Color, Urine 05/10/2022 STRAW (A)  YELLOW Final   APPearance 05/10/2022  CLEAR  CLEAR Final   Specific Gravity, Urine 05/10/2022 1.005  1.005 - 1.030 Final   pH 05/10/2022 5.0  5.0 - 8.0 Final   Glucose, UA 05/10/2022 NEGATIVE  NEGATIVE mg/dL Final   Hgb urine dipstick 05/10/2022 NEGATIVE  NEGATIVE Final   Bilirubin Urine 05/10/2022 NEGATIVE  NEGATIVE Final   Ketones, ur 05/10/2022 NEGATIVE  NEGATIVE mg/dL Final   Protein, ur 30/86/5784 NEGATIVE  NEGATIVE mg/dL Final   Nitrite 69/62/9528 NEGATIVE  NEGATIVE Final   Leukocytes,Ua 05/10/2022 NEGATIVE  NEGATIVE Final   Performed at [facility], [address], [place], [place] [address]  Admission on 05/05/2022, Discharged on 05/06/2022  Component Date Value Ref Range Status   WBC 05/05/2022 7.3  4.0 - 10.5 K/uL Final   RBC 05/05/2022 3.68 (L)  4.22 - 5.81 MIL/uL Final   Hemoglobin 05/05/2022 8.2 (L)  13.0 - 17.0 g/dL Final   Comment: Reticulocyte Hemoglobin testing may be clinically indicated, consider ordering this additional test [id number]    HCT 05/05/2022 27.1 (L)  39.0 - 52.0 % Final   MCV 05/05/2022 73.6 (L)  80.0 - 100.0 fL Final   MCH 05/05/2022 22.3 (L)  26.0 - 34.0 pg Final   MCHC 05/05/2022 30.3  30.0 - 36.0 g/dL Final   RDW 36/64/4034 19.6 (H)  11.5 - 15.5 % Final   Platelets 05/05/2022 234  150 - 400 K/uL Final   nRBC 05/05/2022 0.0  0.0 - 0.2 % Final   Neutrophils Relative % 05/05/2022 44  % Final   Neutro Abs 05/05/2022 3.2  1.7 - 7.7 K/uL Final   Lymphocytes Relative 05/05/2022 38  % Final   Lymphs Abs 05/05/2022 2.8  0.7 - 4.0 K/uL Final   Monocytes Relative 05/05/2022 13  % Final   Monocytes Absolute 05/05/2022 0.9  0.1 - 1.0 K/uL Final   Eosinophils Relative 05/05/2022 4  % Final   Eosinophils Absolute 05/05/2022 0.3  0.0 - 0.5 K/uL Final   Basophils Relative 05/05/2022 1  % Final   Basophils Absolute 05/05/2022 0.1  0.0 - 0.1 K/uL Final   Immature Granulocytes 05/05/2022 0  % Final   Abs Immature Granulocytes 05/05/2022 0.02  0.00 - 0.07 K/uL Final   Performed at  [facility], [address] [place], [place] [address]   Sodium 05/05/2022 138  135 - 145 mmol/L Final   Potassium 05/05/2022 3.8  3.5 - 5.1 mmol/L Final   Chloride 05/05/2022 109  98 - 111 mmol/L Final   CO2 05/05/2022 17 (L)  22 - 32 mmol/L Final   Glucose,  Bld 05/05/2022 91  70 - 99 mg/dL Final   Glucose reference range applies only to samples taken after fasting for at least 8 hours.   BUN 05/05/2022 9  8 - 23 mg/dL Final   Creatinine, Ser 05/05/2022 0.87  0.61 - 1.24 mg/dL Final   Calcium 62/95/2841 8.6 (L)  8.9 - 10.3 mg/dL Final   Total Protein 32/44/0102 7.9  6.5 - 8.1 g/dL Final   Albumin 72/53/6644 3.3 (L)  3.5 - 5.0 g/dL Final   AST 03/47/4259 33  15 - 41 U/L Final   ALT 05/05/2022 23  0 - 44 U/L Final   Alkaline Phosphatase 05/05/2022 60  38 - 126 U/L Final   Total Bilirubin 05/05/2022 0.9  0.3 - 1.2 mg/dL Final   GFR, Estimated 05/05/2022 >60  >60 mL/min Final   Comment: (NOTE) Calculated using the CKD-EPI Creatinine Equation (2021)    Anion gap 05/05/2022 12  5 - 15 Final   Performed at [facility], [address], [place], [place] [address]   Alcohol, Ethyl (B) 05/05/2022 169 (H)  <10 mg/dL Final   Comment: (NOTE) Lowest detectable limit for serum alcohol is 10 mg/dL.  For medical purposes only. Performed at [facility], [address] [place], [place] [address]    Salicylate Lvl 05/05/2022 <7.0 (L)  7.0 - 30.0 mg/dL Final   Performed at [facility], [address] [place], [place] [address]   Acetaminophen (Tylenol), Serum 05/05/2022 <10 (L)  10 - 30 ug/mL Final   Comment: (NOTE) Therapeutic concentrations vary significantly. A range of 10-30 ug/mL  may be an effective concentration for many patients. However, some  are best treated at concentrations outside of this range. Acetaminophen concentrations >150 ug/mL at 4 hours after ingestion  and >50 ug/mL at 12 hours after ingestion are often associated with  toxic  reactions.  Performed at [facility], [address], [place], [place] [address]    Opiates 05/06/2022 NONE DETECTED  NONE DETECTED Final   Cocaine 05/06/2022 NONE DETECTED  NONE DETECTED Final   Benzodiazepines 05/06/2022 POSITIVE (A)  NONE DETECTED Final   Amphetamines 05/06/2022 NONE DETECTED  NONE DETECTED Final   Tetrahydrocannabinol 05/06/2022 NONE DETECTED  NONE DETECTED Final   Barbiturates 05/06/2022 NONE DETECTED  NONE DETECTED Final   Comment: (NOTE) DRUG SCREEN FOR MEDICAL PURPOSES ONLY.  IF CONFIRMATION IS NEEDED FOR ANY PURPOSE, NOTIFY LAB WITHIN 5 DAYS.  LOWEST DETECTABLE LIMITS FOR URINE DRUG SCREEN Drug Class                     Cutoff (ng/mL) Amphetamine and metabolites    1000 Barbiturate and metabolites    200 Benzodiazepine                 200 Tricyclics and metabolites     300 Opiates and metabolites        300 Cocaine and metabolites        300 THC                            50 Performed at [facility], [address] [place], [place] [address]    SARS Coronavirus 2 by RT PCR 05/06/2022 NEGATIVE  NEGATIVE Final   Comment: (NOTE) SARS-CoV-2 target nucleic acids are NOT DETECTED.  The SARS-CoV-2 RNA is generally detectable in upper respiratory specimens during the acute phase of  infection. The lowest concentration of SARS-CoV-2 viral copies this assay can detect is 138 copies/mL. A negative result does not preclude SARS-Cov-2 infection and should not be used as the sole basis for treatment or other patient management decisions. A negative result may occur with  improper specimen collection/handling, submission of specimen other than nasopharyngeal swab, presence of viral mutation(s) within the areas targeted by this assay, and inadequate number of viral copies(<138 copies/mL). A negative result must be combined with clinical observations, patient history, and epidemiological information. The expected result is Negative.  Fact Sheet  for Patients:  BloggerCourse.com  Fact Sheet for Healthcare Providers:  SeriousBroker.it  This test is no                          t yet approved or cleared by the [place] FDA and  has been authorized for detection and/or diagnosis of SARS-CoV-2 by FDA under an Emergency Use Authorization (EUA). This EUA will remain  in effect (meaning this test can be used) for the duration of the COVID-19 declaration under Section 564(b)(1) of the Act, 21 U.S.C.section 360bbb-3(b)(1), unless the authorization is terminated  or revoked sooner.       Influenza A by PCR 05/06/2022 NEGATIVE  NEGATIVE Final   Influenza B by PCR 05/06/2022 NEGATIVE  NEGATIVE Final   Comment: (NOTE) The Xpert Xpress SARS-CoV-2/FLU/RSV plus assay is intended as an aid in the diagnosis of influenza from Nasopharyngeal swab specimens and should not be used as a sole basis for treatment. Nasal washings and aspirates are unacceptable for Xpert Xpress SARS-CoV-2/FLU/RSV testing.  Fact Sheet for Patients: BloggerCourse.com  Fact Sheet for Healthcare Providers: SeriousBroker.it  This test is not yet approved or cleared by the [place] FDA and has been authorized for detection and/or diagnosis of SARS-CoV-2 by FDA under an Emergency Use Authorization (EUA). This EUA will remain in effect (meaning this test can be used) for the duration of the COVID-19 declaration under Section 564(b)(1) of the Act, 21 U.S.C. section 360bbb-3(b)(1), unless the authorization is terminated or revoked.  Performed at [facility], [address], [place], [place] [address]   Admission on 05/05/2022, Discharged on 05/05/2022  Component Date Value Ref Range Status   Sodium 05/05/2022 138  135 - 145 mmol/L Final   Potassium 05/05/2022 3.7  3.5 - 5.1 mmol/L Final   Chloride 05/05/2022 105  98 - 111 mmol/L Final   CO2 05/05/2022  19 (L)  22 - 32 mmol/L Final   Glucose, Bld 05/05/2022 91  70 - 99 mg/dL Final   Glucose reference range applies only to samples taken after fasting for at least 8 hours.   BUN 05/05/2022 11  8 - 23 mg/dL Final   Creatinine, Ser 05/05/2022 0.90  0.61 - 1.24 mg/dL Final   Calcium 91/47/8295 8.9  8.9 - 10.3 mg/dL Final   GFR, Estimated 05/05/2022 >60  >60 mL/min Final   Comment: (NOTE) Calculated using the CKD-EPI Creatinine Equation (2021)    Anion gap 05/05/2022 14  5 - 15 Final   Performed at [facility], [address] [place], [place] [address]   WBC 05/05/2022 8.0  4.0 - 10.5 K/uL Final   RBC 05/05/2022 3.78 (L)  4.22 - 5.81 MIL/uL Final   Hemoglobin 05/05/2022 8.4 (L)  13.0 - 17.0 g/dL Final   Comment: Reticulocyte Hemoglobin testing may be clinically indicated, consider ordering this additional test [id number]    HCT  05/05/2022 27.4 (L)  39.0 - 52.0 % Final   MCV 05/05/2022 72.5 (L)  80.0 - 100.0 fL Final   MCH 05/05/2022 22.2 (L)  26.0 - 34.0 pg Final   MCHC 05/05/2022 30.7  30.0 - 36.0 g/dL Final   RDW 16/07/9603 19.8 (H)  11.5 - 15.5 % Final   Platelets 05/05/2022 222  150 - 400 K/uL Final   nRBC 05/05/2022 0.0  0.0 - 0.2 % Final   Performed at [facility], [address] [place], [place] [address]   Troponin I (High Sensitivity) 05/05/2022 10  <18 ng/L Final   Comment: (NOTE) Elevated high sensitivity troponin I (hsTnI) values and significant  changes across serial measurements may suggest ACS but many other  chronic and acute conditions are known to elevate hsTnI results.  Refer to the "Links" section for chest pain algorithms and additional  guidance. Performed at [facility], [address] [place], [place] [address]    Troponin I (High Sensitivity) 05/05/2022 7  <18 ng/L Final   Comment: (NOTE) Elevated high sensitivity troponin I (hsTnI) values and significant  changes across serial measurements may suggest ACS but many other  chronic and  acute conditions are known to elevate hsTnI results.  Refer to the "Links" section for chest pain algorithms and additional  guidance. Performed at [facility], [address] [place], [place] [address]   Admission on 04/20/2022, Discharged on 05/03/2022  Component Date Value Ref Range Status   Sodium 04/20/2022 135  135 - 145 mmol/L Final   Potassium 04/20/2022 4.1  3.5 - 5.1 mmol/L Final   Chloride 04/20/2022 100  98 - 111 mmol/L Final   CO2 04/20/2022 19 (L)  22 - 32 mmol/L Final   Glucose, Bld 04/20/2022 90  70 - 99 mg/dL Final   Glucose reference range applies only to samples taken after fasting for at least 8 hours.   BUN 04/20/2022 10  8 - 23 mg/dL Final   Creatinine, Ser 04/20/2022 1.06  0.61 - 1.24 mg/dL Final   Calcium 62/13/0865 8.8 (L)  8.9 - 10.3 mg/dL Final   Total Protein 78/46/9629 7.3  6.5 - 8.1 g/dL Final   Albumin 52/84/1324 3.4 (L)  3.5 - 5.0 g/dL Final   AST 40/07/2724 74 (H)  15 - 41 U/L Final   ALT 04/20/2022 41  0 - 44 U/L Final   Alkaline Phosphatase 04/20/2022 69  38 - 126 U/L Final   Total Bilirubin 04/20/2022 1.4 (H)  0.3 - 1.2 mg/dL Final   GFR, Estimated 04/20/2022 >60  >60 mL/min Final   Comment: (NOTE) Calculated using the CKD-EPI Creatinine Equation (2021)    Anion gap 04/20/2022 16 (H)  5 - 15 Final   Performed at [facility], [address], [place], [place] [address]   Sodium 04/20/2022 135  135 - 145 mmol/L Final   Potassium 04/20/2022 4.2  3.5 - 5.1 mmol/L Final   Chloride 04/20/2022 103  98 - 111 mmol/L Final   BUN 04/20/2022 10  8 - 23 mg/dL Final   Creatinine, Ser 04/20/2022 1.30 (H)  0.61 - 1.24 mg/dL Final   Glucose, Bld 03/47/4259 93  70 - 99 mg/dL Final   Glucose reference range applies only to samples taken after fasting for at least 8 hours.   Calcium, Ion 04/20/2022 0.88 (LL)  1.15 - 1.40 mmol/L Final   TCO2 04/20/2022 21 (L)  22 - 32 mmol/L Final  Hemoglobin 04/20/2022 12.9 (L)  13.0 - 17.0 g/dL Final   HCT  62/95/2841 38.0 (L)  39.0 - 52.0 % Final   Comment 04/20/2022 NOTIFIED PHYSICIAN   Final   WBC 04/20/2022 6.9  4.0 - 10.5 K/uL Final   RBC 04/20/2022 4.91  4.22 - 5.81 MIL/uL Final   Hemoglobin 04/20/2022 11.1 (L)  13.0 - 17.0 g/dL Final   HCT 32/44/0102 38.0 (L)  39.0 - 52.0 % Final   MCV 04/20/2022 77.4 (L)  80.0 - 100.0 fL Final   MCH 04/20/2022 22.6 (L)  26.0 - 34.0 pg Final   MCHC 04/20/2022 29.2 (L)  30.0 - 36.0 g/dL Final   RDW 72/53/6644 21.0 (H)  11.5 - 15.5 % Final   Platelets 04/20/2022 139 (L)  150 - 400 K/uL Final   nRBC 04/20/2022 0.0  0.0 - 0.2 % Final   Performed at [facility], [address], [place], [place] [address]   Alcohol, Ethyl (B) 04/20/2022 262 (H)  <10 mg/dL Final   Comment: (NOTE) Lowest detectable limit for serum alcohol is 10 mg/dL.  For medical purposes only. Performed at [facility], [address] [place], [place] [address]    Color, Urine 04/20/2022 YELLOW  YELLOW Final   APPearance 04/20/2022 CLEAR  CLEAR Final   Specific Gravity, Urine 04/20/2022 1.008  1.005 - 1.030 Final   pH 04/20/2022 7.0  5.0 - 8.0 Final   Glucose, UA 04/20/2022 NEGATIVE  NEGATIVE mg/dL Final   Hgb urine dipstick 04/20/2022 NEGATIVE  NEGATIVE Final   Bilirubin Urine 04/20/2022 NEGATIVE  NEGATIVE Final   Ketones, ur 04/20/2022 NEGATIVE  NEGATIVE mg/dL Final   Protein, ur 38/75/6433 NEGATIVE  NEGATIVE mg/dL Final   Nitrite 29/51/8841 NEGATIVE  NEGATIVE Final   Leukocytes,Ua 04/20/2022 NEGATIVE  NEGATIVE Final   Performed at [facility], [address] [place], [place] [address]   Lactic Acid, Venous 04/20/2022 3.2 (HH)  0.5 - 1.9 mmol/L Final   Comment: CRITICAL RESULT CALLED TO, READ BACK BY AND VERIFIED WITH: M.RUGGIERO,RN @1959  04/20/2022 [email or web address] Performed at [facility], [address], [place], [place] [address]    Prothrombin Time 04/20/2022 14.1  11.4 - 15.2 seconds Final   INR 04/20/2022 1.1  0.8 - 1.2 Final   Comment: (NOTE) INR  goal varies based on device and disease states. Performed at [facility], [address], [place], [place] [address]    Blood Bank Specimen 04/20/2022 SAMPLE AVAILABLE FOR TESTING   Final   Sample Expiration 04/20/2022    Final                   Value:04/21/2022,2359 Performed at [facility], [address], [place], [place] [address]    Sodium 04/21/2022 141  135 - 145 mmol/L Final   Potassium 04/21/2022 4.1  3.5 - 5.1 mmol/L Final   Chloride 04/21/2022 102  98 - 111 mmol/L Final   CO2 04/21/2022 25  22 - 32 mmol/L Final   Glucose, Bld 04/21/2022 104 (H)  70 - 99 mg/dL Final   Glucose reference range applies only to samples taken after fasting for at least 8 hours.   BUN 04/21/2022 12  8 - 23 mg/dL Final   Creatinine, Ser 04/21/2022 0.97  0.61 - 1.24 mg/dL Final   Calcium 20/25/4270 9.1  8.9 - 10.3 mg/dL Final   GFR, Estimated 04/21/2022 >60  >60 mL/min Final   Comment: (NOTE) Calculated  using the CKD-EPI Creatinine Equation (2021)    Anion gap 04/21/2022 14  5 - 15 Final   Performed at [facility], [address] [place], [place] [address]   WBC 04/21/2022 5.8  4.0 - 10.5 K/uL Final   RBC 04/21/2022 3.94 (L)  4.22 - 5.81 MIL/uL Final   Hemoglobin 04/21/2022 9.1 (L)  13.0 - 17.0 g/dL Final   HCT 60/45/4098 29.3 (L)  39.0 - 52.0 % Final   MCV 04/21/2022 74.4 (L)  80.0 - 100.0 fL Final   MCH 04/21/2022 23.1 (L)  26.0 - 34.0 pg Final   MCHC 04/21/2022 31.1  30.0 - 36.0 g/dL Final   RDW 11/91/4782 20.3 (H)  11.5 - 15.5 % Final   Platelets 04/21/2022 99 (L)  150 - 400 K/uL Final   Comment: Immature Platelet Fraction may be clinically indicated, consider ordering this additional test [id number] REPEATED TO VERIFY PLATELET COUNT CONFIRMED BY SMEAR    nRBC 04/21/2022 0.0  0.0 - 0.2 % Final   Performed at [facility], [address] [place], [place] [address]   HIV Screen 4th Generation wRfx 04/21/2022 Non Reactive  Non Reactive Final   Performed at  [facility], [address], [place], [place] [address]   Sodium 04/22/2022 138  135 - 145 mmol/L Final   Potassium 04/22/2022 3.7  3.5 - 5.1 mmol/L Final   Chloride 04/22/2022 103  98 - 111 mmol/L Final   CO2 04/22/2022 26  22 - 32 mmol/L Final   Glucose, Bld 04/22/2022 124 (H)  70 - 99 mg/dL Final   Glucose reference range applies only to samples taken after fasting for at least 8 hours.   BUN 04/22/2022 9  8 - 23 mg/dL Final   Creatinine, Ser 04/22/2022 0.90  0.61 - 1.24 mg/dL Final   Calcium 52/84/1324 8.6 (L)  8.9 - 10.3 mg/dL Final   GFR, Estimated 04/22/2022 >60  >60 mL/min Final   Comment: (NOTE) Calculated using the CKD-EPI Creatinine Equation (2021)    Anion gap 04/22/2022 9  5 - 15 Final   Performed at [facility], [address], [place], [place] [address]   WBC 04/22/2022 3.6 (L)  4.0 - 10.5 K/uL Final   RBC 04/22/2022 3.55 (L)  4.22 - 5.81 MIL/uL Final   Hemoglobin 04/22/2022 8.2 (L)  13.0 - 17.0 g/dL Final   Comment: Reticulocyte Hemoglobin testing may be clinically indicated, consider ordering this additional test [id number]    HCT 04/22/2022 26.6 (L)  39.0 - 52.0 % Final   MCV 04/22/2022 74.9 (L)  80.0 - 100.0 fL Final   MCH 04/22/2022 23.1 (L)  26.0 - 34.0 pg Final   MCHC 04/22/2022 30.8  30.0 - 36.0 g/dL Final   RDW 03/47/4259 20.5 (H)  11.5 - 15.5 % Final   Platelets 04/22/2022 73 (L)  150 - 400 K/uL Final   Comment: Immature Platelet Fraction may be clinically indicated, consider ordering this additional test [id number] REPEATED TO VERIFY    nRBC 04/22/2022 0.0  0.0 - 0.2 % Final   Performed at [facility], [address], [place], [place] [address]   WBC 04/23/2022 3.7 (L)  4.0 - 10.5 K/uL Final   RBC 04/23/2022 3.61 (L)  4.22 - 5.81 MIL/uL Final   Hemoglobin 04/23/2022 8.4 (L)  13.0 - 17.0 g/dL Final   Comment: Reticulocyte Hemoglobin testing may be clinically indicated, consider ordering this additional test [id number]  HCT  04/23/2022 27.0 (L)  39.0 - 52.0 % Final   MCV 04/23/2022 74.8 (L)  80.0 - 100.0 fL Final   MCH 04/23/2022 23.3 (L)  26.0 - 34.0 pg Final   MCHC 04/23/2022 31.1  30.0 - 36.0 g/dL Final   RDW 40/98/1191 20.9 (H)  11.5 - 15.5 % Final   Platelets 04/23/2022 73 (L)  150 - 400 K/uL Final   Comment: Immature Platelet Fraction may be clinically indicated, consider ordering this additional test [id number] CONSISTENT WITH PREVIOUS RESULT REPEATED TO VERIFY    nRBC 04/23/2022 0.0  0.0 - 0.2 % Final   Performed at [facility], [address] [place], [place] [address]   WBC 04/26/2022 3.8 (L)  4.0 - 10.5 K/uL Final   RBC 04/26/2022 3.34 (L)  4.22 - 5.81 MIL/uL Final   Hemoglobin 04/26/2022 7.7 (L)  13.0 - 17.0 g/dL Final   Comment: Reticulocyte Hemoglobin testing may be clinically indicated, consider ordering this additional test [id number]    HCT 04/26/2022 25.1 (L)  39.0 - 52.0 % Final   MCV 04/26/2022 75.1 (L)  80.0 - 100.0 fL Final   MCH 04/26/2022 23.1 (L)  26.0 - 34.0 pg Final   MCHC 04/26/2022 30.7  30.0 - 36.0 g/dL Final   RDW 95/28/4132 20.3 (H)  11.5 - 15.5 % Final   Platelets 04/26/2022 98 (L)  150 - 400 K/uL Final   REPEATED TO VERIFY   nRBC 04/26/2022 0.0  0.0 - 0.2 % Final   Performed at [facility], [address], [place], [place] [address]   Sodium 04/26/2022 140  135 - 145 mmol/L Final   Potassium 04/26/2022 4.5  3.5 - 5.1 mmol/L Final   Chloride 04/26/2022 105  98 - 111 mmol/L Final   CO2 04/26/2022 28  22 - 32 mmol/L Final   Glucose, Bld 04/26/2022 101 (H)  70 - 99 mg/dL Final   Glucose reference range applies only to samples taken after fasting for at least 8 hours.   BUN 04/26/2022 12  8 - 23 mg/dL Final   Creatinine, Ser 04/26/2022 1.04  0.61 - 1.24 mg/dL Final   Calcium 27/25/3664 8.8 (L)  8.9 - 10.3 mg/dL Final   GFR, Estimated 04/26/2022 >60  >60 mL/min Final   Comment: (NOTE) Calculated using the CKD-EPI Creatinine Equation (2021)    Anion gap  04/26/2022 7  5 - 15 Final   Performed at [facility], [address], [place], [place] [address]   WBC 04/27/2022 3.8 (L)  4.0 - 10.5 K/uL Final   RBC 04/27/2022 3.59 (L)  4.22 - 5.81 MIL/uL Final   Hemoglobin 04/27/2022 8.2 (L)  13.0 - 17.0 g/dL Final   Comment: Reticulocyte Hemoglobin testing may be clinically indicated, consider ordering this additional test [id number]    HCT 04/27/2022 27.1 (L)  39.0 - 52.0 % Final   MCV 04/27/2022 75.5 (L)  80.0 - 100.0 fL Final   MCH 04/27/2022 22.8 (L)  26.0 - 34.0 pg Final   MCHC 04/27/2022 30.3  30.0 - 36.0 g/dL Final   RDW 75/64/3329 20.1 (H)  11.5 - 15.5 % Final   Platelets 04/27/2022 114 (L)  150 - 400 K/uL Final   REPEATED TO VERIFY   nRBC 04/27/2022 0.0  0.0 - 0.2 % Final   Performed at [facility], [address], [place], [place] [address]   Sodium 04/27/2022 139  135 - 145 mmol/L Final  Potassium 04/27/2022 4.2  3.5 - 5.1 mmol/L Final   Chloride 04/27/2022 108  98 - 111 mmol/L Final   CO2 04/27/2022 25  22 - 32 mmol/L Final   Glucose, Bld 04/27/2022 117 (H)  70 - 99 mg/dL Final   Glucose reference range applies only to samples taken after fasting for at least 8 hours.   BUN 04/27/2022 9  8 - 23 mg/dL Final   Creatinine, Ser 04/27/2022 1.16  0.61 - 1.24 mg/dL Final   Calcium 16/07/9603 8.7 (L)  8.9 - 10.3 mg/dL Final   GFR, Estimated 04/27/2022 >60  >60 mL/min Final   Comment: (NOTE) Calculated using the CKD-EPI Creatinine Equation (2021)    Anion gap 04/27/2022 6  5 - 15 Final   Performed at [facility], [address], [place], [place] [address]   WBC 04/28/2022 4.5  4.0 - 10.5 K/uL Final   RBC 04/28/2022 3.39 (L)  4.22 - 5.81 MIL/uL Final   Hemoglobin 04/28/2022 7.7 (L)  13.0 - 17.0 g/dL Final   Comment: Reticulocyte Hemoglobin testing may be clinically indicated, consider ordering this additional test [id number]    HCT 04/28/2022 25.4 (L)  39.0 - 52.0 % Final   MCV 04/28/2022 74.9 (L)  80.0 - 100.0 fL  Final   MCH 04/28/2022 22.7 (L)  26.0 - 34.0 pg Final   MCHC 04/28/2022 30.3  30.0 - 36.0 g/dL Final   RDW 95/62/1308 19.9 (H)  11.5 - 15.5 % Final   Platelets 04/28/2022 125 (L)  150 - 400 K/uL Final   REPEATED TO VERIFY   nRBC 04/28/2022 0.0  0.0 - 0.2 % Final   Performed at [facility], [address] [place], [place] [address]   WBC 04/29/2022 4.3  4.0 - 10.5 K/uL Final   RBC 04/29/2022 3.39 (L)  4.22 - 5.81 MIL/uL Final   Hemoglobin 04/29/2022 7.8 (L)  13.0 - 17.0 g/dL Final   Comment: Reticulocyte Hemoglobin testing may be clinically indicated, consider ordering this additional test [id number]    HCT 04/29/2022 25.1 (L)  39.0 - 52.0 % Final   MCV 04/29/2022 74.0 (L)  80.0 - 100.0 fL Final   MCH 04/29/2022 23.0 (L)  26.0 - 34.0 pg Final   MCHC 04/29/2022 31.1  30.0 - 36.0 g/dL Final   RDW 41/32/4401 19.9 (H)  11.5 - 15.5 % Final   Platelets 04/29/2022 125 (L)  150 - 400 K/uL Final   REPEATED TO VERIFY   nRBC 04/29/2022 0.0  0.0 - 0.2 % Final   Performed at [facility], [address], [place], [place] [address]   WBC 05/01/2022 3.6 (L)  4.0 - 10.5 K/uL Final   RBC 05/01/2022 3.13 (L)  4.22 - 5.81 MIL/uL Final   Hemoglobin 05/01/2022 7.1 (L)  13.0 - 17.0 g/dL Final   Comment: Reticulocyte Hemoglobin testing may be clinically indicated, consider ordering this additional test [id number]    HCT 05/01/2022 23.0 (L)  39.0 - 52.0 % Final   MCV 05/01/2022 73.5 (L)  80.0 - 100.0 fL Final   MCH 05/01/2022 22.7 (L)  26.0 - 34.0 pg Final   MCHC 05/01/2022 30.9  30.0 - 36.0 g/dL Final   RDW 74/25/9563 19.8 (H)  11.5 - 15.5 % Final   Platelets 05/01/2022 149 (L)  150 - 400 K/uL Final   REPEATED TO VERIFY   nRBC 05/01/2022 0.0  0.0 - 0.2 % Final   Performed at [facility], [address] [place], [place] [address]   Sodium 05/01/2022 137  135 - 145 mmol/L Final   Potassium 05/01/2022 4.0  3.5 - 5.1 mmol/L Final   Chloride 05/01/2022 109  98 - 111 mmol/L Final    CO2 05/01/2022 23  22 - 32 mmol/L Final   Glucose, Bld 05/01/2022 98  70 - 99 mg/dL Final   Glucose reference range applies only to samples taken after fasting for at least 8 hours.   BUN 05/01/2022 14  8 - 23 mg/dL Final   Creatinine, Ser 05/01/2022 0.83  0.61 - 1.24 mg/dL Final   Calcium 91/47/8295 8.4 (L)  8.9 - 10.3 mg/dL Final   GFR, Estimated 05/01/2022 >60  >60 mL/min Final   Comment: (NOTE) Calculated using the CKD-EPI Creatinine Equation (2021)    Anion gap 05/01/2022 5  5 - 15 Final   Performed at [facility], [address] [place], [place] [address]   WBC 05/02/2022 7.8  4.0 - 10.5 K/uL Final   RBC 05/02/2022 3.23 (L)  4.22 - 5.81 MIL/uL Final   Hemoglobin 05/02/2022 7.2 (L)  13.0 - 17.0 g/dL Final   Comment: Reticulocyte Hemoglobin testing may be clinically indicated, consider ordering this additional test [id number]    HCT 05/02/2022 23.6 (L)  39.0 - 52.0 % Final   MCV 05/02/2022 73.1 (L)  80.0 - 100.0 fL Final   MCH 05/02/2022 22.3 (L)  26.0 - 34.0 pg Final   MCHC 05/02/2022 30.5  30.0 - 36.0 g/dL Final   RDW 62/95/2841 19.5 (H)  11.5 - 15.5 % Final   Platelets 05/02/2022 153  150 - 400 K/uL Final   REPEATED TO VERIFY   nRBC 05/02/2022 0.0  0.0 - 0.2 % Final   Performed at [facility], [address], [place], [place] [address]  Admission on 03/31/2022, Discharged on 04/02/2022  Component Date Value Ref Range Status   Sodium 03/31/2022 140  135 - 145 mmol/L Final   Potassium 03/31/2022 3.6  3.5 - 5.1 mmol/L Final   Chloride 03/31/2022 108  98 - 111 mmol/L Final   CO2 03/31/2022 19 (L)  22 - 32 mmol/L Final   Glucose, Bld 03/31/2022 84  70 - 99 mg/dL Final   Glucose reference range applies only to samples taken after fasting for at least 8 hours.   BUN 03/31/2022 9  8 - 23 mg/dL Final   Creatinine, Ser 03/31/2022 0.86  0.61 - 1.24 mg/dL Final   Calcium 08/13/2535 8.6 (L)  8.9 - 10.3 mg/dL Final   Total Protein 64/40/3474 7.2  6.5 - 8.1 g/dL Final    Albumin 25/95/6387 3.5  3.5 - 5.0 g/dL Final   AST 56/43/3295 56 (H)  15 - 41 U/L Final   ALT 03/31/2022 35  0 - 44 U/L Final   Alkaline Phosphatase 03/31/2022 61  38 - 126 U/L Final   Total Bilirubin 03/31/2022 2.0 (H)  0.3 - 1.2 mg/dL Final   GFR, Estimated 03/31/2022 >60  >60 mL/min Final   Comment: (NOTE) Calculated using the CKD-EPI Creatinine Equation (2021)    Anion gap 03/31/2022 13  5 - 15 Final   Performed at [facility], [address], [place], [place] [address]   Alcohol, Ethyl (B) 03/31/2022 203 (H)  <10 mg/dL Final   Comment: (NOTE) Lowest detectable limit for serum alcohol is 10 mg/dL.  For medical purposes only. Performed at [facility], [address] [place], [place] [address]    Salicylate Lvl 03/31/2022 <7.0 (L)  7.0 - 30.0 mg/dL Final   Performed at [facility], [address] [place], [place] [address]   Acetaminophen (Tylenol), Serum 03/31/2022 <10 (L)  10 - 30 ug/mL Final   Comment: (NOTE) Therapeutic concentrations vary significantly. A range of 10-30 ug/mL  may be an effective concentration for many patients. However, some  are best treated at concentrations outside of this range. Acetaminophen concentrations >150 ug/mL at 4 hours after ingestion  and >50 ug/mL at 12 hours after ingestion are often associated with  toxic reactions.  Performed at [facility], [address] [place], [place] [address]    WBC 03/31/2022 4.4  4.0 - 10.5 K/uL Final   RBC 03/31/2022 5.10  4.22 - 5.81 MIL/uL Final   Hemoglobin 03/31/2022 11.8 (L)  13.0 - 17.0 g/dL Final   HCT 91/47/8295 38.5 (L)  39.0 - 52.0 % Final   MCV 03/31/2022 75.5 (L)  80.0 - 100.0 fL Final   MCH 03/31/2022 23.1 (L)  26.0 - 34.0 pg Final   MCHC 03/31/2022 30.6  30.0 - 36.0 g/dL Final   RDW 62/13/0865 19.4 (H)  11.5 - 15.5 % Final   Platelets 03/31/2022 102 (L)  150 - 400 K/uL Final   Comment: Immature Platelet Fraction may be clinically indicated, consider ordering  this additional test [id number] REPEATED TO VERIFY PLATELET COUNT CONFIRMED BY SMEAR    nRBC 03/31/2022 0.0  0.0 - 0.2 % Final   Performed at [facility], [address], [place], [place] [address]   Opiates 03/31/2022 NONE DETECTED  NONE DETECTED Final   Cocaine 03/31/2022 NONE DETECTED  NONE DETECTED Final   Benzodiazepines 03/31/2022 NONE DETECTED  NONE DETECTED Final   Amphetamines 03/31/2022 NONE DETECTED  NONE DETECTED Final   Tetrahydrocannabinol 03/31/2022 NONE DETECTED  NONE DETECTED Final   Barbiturates 03/31/2022 NONE DETECTED  NONE DETECTED Final   Comment: (NOTE) DRUG SCREEN FOR MEDICAL PURPOSES ONLY.  IF CONFIRMATION IS NEEDED FOR ANY PURPOSE, NOTIFY LAB WITHIN 5 DAYS.  LOWEST DETECTABLE LIMITS FOR URINE DRUG SCREEN Drug Class                     Cutoff (ng/mL) Amphetamine and metabolites    1000 Barbiturate and metabolites    200 Benzodiazepine                 200 Tricyclics and metabolites     300 Opiates and metabolites        300 Cocaine and metabolites        300 THC                            50 Performed at [facility] Lab, [address] [place], [place] [address]    SARS Coronavirus 2 by RT PCR 03/31/2022 NEGATIVE  NEGATIVE Final   Comment: (NOTE) SARS-CoV-2 target nucleic acids are NOT DETECTED.  The SARS-CoV-2 RNA is generally detectable in upper and lower respiratory specimens during the acute phase of infection. The lowest concentration of SARS-CoV-2 viral copies this assay can detect is 250 copies / mL. A negative result does not preclude SARS-CoV-2 infection and should not be used as the sole basis for treatment or other patient management decisions.  A negative result may occur with improper specimen collection / handling, submission of specimen other than nasopharyngeal swab,  presence of viral mutation(s) within the areas targeted by this assay, and inadequate number of viral copies (<250 copies / mL). A negative result must be  combined with clinical observations, patient history, and epidemiological information.  Fact Sheet for Patients:   RoadLapTop.co.za  Fact Sheet for Healthcare Providers: http://kim-miller.com/  This test is not yet approved or                           cleared by the [place] FDA and has been authorized for detection and/or diagnosis of SARS-CoV-2 by FDA under an Emergency Use Authorization (EUA).  This EUA will remain in effect (meaning this test can be used) for the duration of the COVID-19 declaration under Section 564(b)(1) of the Act, 21 U.S.C. section 360bbb-3(b)(1), unless the authorization is terminated or revoked sooner.  Performed at [facility], [address] [place], [place] [address]   Admission on 01/22/2022, Discharged on 01/22/2022  Component Date Value Ref Range Status   Lipase 01/22/2022 44  11 - 51 U/L Final   Performed at [facility], [address], [place], [place] [address]   Sodium 01/22/2022 138  135 - 145 mmol/L Final   Potassium 01/22/2022 3.4 (L)  3.5 - 5.1 mmol/L Final   Chloride 01/22/2022 105  98 - 111 mmol/L Final   CO2 01/22/2022 24  22 - 32 mmol/L Final   Glucose, Bld 01/22/2022 90  70 - 99 mg/dL Final   Glucose reference range applies only to samples taken after fasting for at least 8 hours.   BUN 01/22/2022 10  8 - 23 mg/dL Final   Creatinine, Ser 01/22/2022 0.87  0.61 - 1.24 mg/dL Final   Calcium 62/13/0865 8.9  8.9 - 10.3 mg/dL Final   Total Protein 78/46/9629 7.9  6.5 - 8.1 g/dL Final   Albumin 52/84/1324 3.7  3.5 - 5.0 g/dL Final   AST 40/07/2724 43 (H)  15 - 41 U/L Final   ALT 01/22/2022 31  0 - 44 U/L Final   Alkaline Phosphatase 01/22/2022 74  38 - 126 U/L Final   Total Bilirubin 01/22/2022 2.0 (H)  0.3 - 1.2 mg/dL Final   GFR, Estimated 01/22/2022 >60  >60 mL/min Final   Comment: (NOTE) Calculated using the CKD-EPI Creatinine Equation (2021)    Anion  gap 01/22/2022 9  5 - 15 Final   Performed at [facility], [address] [place], [place] [address]   WBC 01/22/2022 4.7  4.0 - 10.5 K/uL Final   RBC 01/22/2022 5.84 (H)  4.22 - 5.81 MIL/uL Final   Hemoglobin 01/22/2022 13.1  13.0 - 17.0 g/dL Final   HCT 03/47/4259 43.9  39.0 - 52.0 % Final   MCV 01/22/2022 75.2 (L)  80.0 - 100.0 fL Final   MCH 01/22/2022 22.4 (L)  26.0 - 34.0 pg Final   MCHC 01/22/2022 29.8 (L)  30.0 - 36.0 g/dL Final   RDW 56/38/7564 19.7 (H)  11.5 - 15.5 % Final   Platelets 01/22/2022 128 (L)  150 - 400 K/uL Final   nRBC 01/22/2022 0.0  0.0 - 0.2 % Final   Performed at [facility], [address] [place], [place] [address]   Color, Urine 01/22/2022 STRAW (A)  YELLOW Final   APPearance 01/22/2022 CLEAR  CLEAR Final   Specific Gravity, Urine 01/22/2022 1.002 (L)  1.005 - 1.030 Final   pH 01/22/2022 5.0  5.0 -  8.0 Final   Glucose, UA 01/22/2022 NEGATIVE  NEGATIVE mg/dL Final   Hgb urine dipstick 01/22/2022 NEGATIVE  NEGATIVE Final   Bilirubin Urine 01/22/2022 NEGATIVE  NEGATIVE Final   Ketones, ur 01/22/2022 NEGATIVE  NEGATIVE mg/dL Final   Protein, ur 16/07/9603 NEGATIVE  NEGATIVE mg/dL Final   Nitrite 54/06/8118 NEGATIVE  NEGATIVE Final   Leukocytes,Ua 01/22/2022 NEGATIVE  NEGATIVE Final   Performed at [facility], [address], [place], [place] [address]   Opiates 01/22/2022 NONE DETECTED  NONE DETECTED Final   Cocaine 01/22/2022 NONE DETECTED  NONE DETECTED Final   Benzodiazepines 01/22/2022 NONE DETECTED  NONE DETECTED Final   Amphetamines 01/22/2022 NONE DETECTED  NONE DETECTED Final   Tetrahydrocannabinol 01/22/2022 NONE DETECTED  NONE DETECTED Final   Barbiturates 01/22/2022 NONE DETECTED  NONE DETECTED Final   Comment: (NOTE) DRUG SCREEN FOR MEDICAL PURPOSES ONLY.  IF CONFIRMATION IS NEEDED FOR ANY PURPOSE, NOTIFY LAB WITHIN 5 DAYS.  LOWEST DETECTABLE LIMITS FOR URINE DRUG SCREEN Drug  Class                     Cutoff (ng/mL) Amphetamine and metabolites    1000 Barbiturate and metabolites    200 Benzodiazepine                 200 Tricyclics and metabolites     300 Opiates and metabolites        300 Cocaine and metabolites        300 THC                            50 Performed at [facility], [address], [place], [place] [address]    Alcohol, Ethyl (B) 01/22/2022 134 (H)  <10 mg/dL Final   Comment: (NOTE) Lowest detectable limit for serum alcohol is 10 mg/dL.  For medical purposes only. Performed at [facility], [address] [place], [place] [address]    Specimen Description 01/22/2022    Final                   Value:URINE, CLEAN CATCH Performed at [facility], [address], [place], [place] [address]    Special Requests 01/22/2022    Final                   Value:NONE Performed at [facility], [address], [place], [place] [address]    Culture 01/22/2022    Final                   Value:NO GROWTH Performed at [facility], [address], [place], [place] [address]    Report Status 01/22/2022 01/23/2022 FINAL   Final   Ammonia 01/22/2022 18  9 - 35 umol/L Final   Performed at [facility], [address] [place], [place] [address]   Acetaminophen (Tylenol), Serum 01/22/2022 <10 (L)  10 - 30 ug/mL Final   Comment: (NOTE) Therapeutic concentrations vary significantly. A range of 10-30 ug/mL  may be an effective concentration for many patients. However, some  are best treated at concentrations outside of this range. Acetaminophen concentrations >150 ug/mL at 4 hours after ingestion  and >50 ug/mL at 12 hours after ingestion are often associated with  toxic reactions.  Performed at [facility], [address], [place], [place] [address]  Salicylate Lvl 01/22/2022 <7.0 (L)  7.0 - 30.0 mg/dL Final   Performed at [facility], [address], [place], [place] [address]  Admission on 01/10/2022, Discharged on 01/10/2022  Component Date Value Ref Range Status   Sodium 01/10/2022 131 (L)  135 - 145 mmol/L Final   Potassium 01/10/2022 4.0  3.5 - 5.1 mmol/L Final   Chloride 01/10/2022 98  98 - 111 mmol/L Final   CO2 01/10/2022 23  22 - 32 mmol/L Final   Glucose, Bld 01/10/2022 84  70 - 99 mg/dL Final   Glucose reference range applies only to samples taken after fasting for at least 8 hours.   BUN 01/10/2022 8  8 - 23 mg/dL Final   Creatinine, Ser 01/10/2022 0.76  0.61 - 1.24 mg/dL Final   Calcium 60/45/4098 9.1  8.9 - 10.3 mg/dL Final   Total Protein 11/91/4782 7.8  6.5 - 8.1 g/dL Final   Albumin 95/62/1308 3.8  3.5 - 5.0 g/dL Final   AST 65/78/4696 55 (H)  15 - 41 U/L Final   ALT 01/10/2022 36  0 - 44 U/L Final   Alkaline Phosphatase 01/10/2022 76  38 - 126 U/L Final   Total Bilirubin 01/10/2022 3.3 (H)  0.3 - 1.2 mg/dL Final   GFR, Estimated 01/10/2022 >60  >60 mL/min Final   Comment: (NOTE) Calculated using the CKD-EPI Creatinine Equation (2021)    Anion gap 01/10/2022 10  5 - 15 Final   Performed at [facility], [address] [place], [place] [address]   WBC 01/10/2022 5.7  4.0 - 10.5 K/uL Final   RBC 01/10/2022 5.16  4.22 - 5.81 MIL/uL Final   Hemoglobin 01/10/2022 11.8 (L)  13.0 - 17.0 g/dL Final   HCT 41/32/4401 39.0  39.0 - 52.0 % Final   MCV 01/10/2022 75.6 (L)  80.0 - 100.0 fL Final   MCH 01/10/2022 22.9 (L)  26.0 - 34.0 pg Final   MCHC 01/10/2022 30.3  30.0 - 36.0 g/dL Final   RDW 02/72/5366 18.4 (H)  11.5 - 15.5 % Final   Platelets 01/10/2022 138 (L)  150 - 400 K/uL Final   REPEATED TO VERIFY   nRBC 01/10/2022 0.0  0.0 - 0.2 % Final   Neutrophils Relative % 01/10/2022 61  % Final   Neutro Abs 01/10/2022 3.5  1.7 - 7.7 K/uL Final   Lymphocytes Relative 01/10/2022 27  % Final   Lymphs Abs 01/10/2022 1.5  0.7 - 4.0 K/uL Final   Monocytes Relative 01/10/2022 10   % Final   Monocytes Absolute 01/10/2022 0.6  0.1 - 1.0 K/uL Final   Eosinophils Relative 01/10/2022 1  % Final   Eosinophils Absolute 01/10/2022 0.0  0.0 - 0.5 K/uL Final   Basophils Relative 01/10/2022 1  % Final   Basophils Absolute 01/10/2022 0.1  0.0 - 0.1 K/uL Final   Immature Granulocytes 01/10/2022 0  % Final   Abs Immature Granulocytes 01/10/2022 0.02  0.00 - 0.07 K/uL Final   Performed at [facility], [address], [place], [place] [address]   Opiates 01/10/2022 NONE DETECTED  NONE DETECTED Final   Cocaine 01/10/2022 NONE DETECTED  NONE DETECTED Final   Benzodiazepines 01/10/2022 NONE DETECTED  NONE DETECTED Final   Amphetamines 01/10/2022 NONE DETECTED  NONE DETECTED Final   Tetrahydrocannabinol 01/10/2022 NONE DETECTED  NONE DETECTED Final   Barbiturates 01/10/2022 NONE DETECTED  NONE DETECTED Final   Comment: (NOTE) DRUG SCREEN FOR MEDICAL  PURPOSES ONLY.  IF CONFIRMATION IS NEEDED FOR ANY PURPOSE, NOTIFY LAB WITHIN 5 DAYS.  LOWEST DETECTABLE LIMITS FOR URINE DRUG SCREEN Drug Class                     Cutoff (ng/mL) Amphetamine and metabolites    1000 Barbiturate and metabolites    200 Benzodiazepine                 200 Tricyclics and metabolites     300 Opiates and metabolites        300 Cocaine and metabolites        300 THC                            50 Performed at [facility], [address], [place], [place] [address]    Color, Urine 01/10/2022 YELLOW  YELLOW Final   APPearance 01/10/2022 CLEAR  CLEAR Final   Specific Gravity, Urine 01/10/2022 1.003 (L)  1.005 - 1.030 Final   pH 01/10/2022 5.0  5.0 - 8.0 Final   Glucose, UA 01/10/2022 NEGATIVE  NEGATIVE mg/dL Final   Hgb urine dipstick 01/10/2022 SMALL (A)  NEGATIVE Final   Bilirubin Urine 01/10/2022 NEGATIVE  NEGATIVE Final   Ketones, ur 01/10/2022 5 (A)  NEGATIVE mg/dL Final   Protein, ur 60/45/4098 NEGATIVE  NEGATIVE mg/dL Final   Nitrite 11/91/4782 NEGATIVE  NEGATIVE Final    Leukocytes,Ua 01/10/2022 NEGATIVE  NEGATIVE Final   WBC, UA 01/10/2022 0-5  0 - 5 WBC/hpf Final   Bacteria, UA 01/10/2022 NONE SEEN  NONE SEEN Final   Squamous Epithelial / LPF 01/10/2022 0-5  0 - 5 Final   Performed at [facility], [address], [place], [place] [address]   Lipase 01/10/2022 37  11 - 51 U/L Final   Performed at [facility], [address] [place], [place] [address]   Total CK 01/10/2022 163  49 - 397 U/L Final   Performed at [facility], [address] [place], [place] [address]  There may be more visits with results that are not included.    Blood Alcohol level:  Lab Results  Component Value Date   ETH <10 06/11/2022   ETH 194 (H) 05/29/2022    Metabolic Disorder Labs: Lab Results  Component Value Date   HGBA1C 5.2 05/24/2022   MPG 102.54 05/24/2022   MPG 108 05/06/2009   No results found for: "PROLACTIN" Lab Results  Component Value Date   CHOL 140 05/31/2022   TRIG 91 05/31/2022   HDL 54 05/31/2022   CHOLHDL 2.6 05/31/2022   VLDL 18 05/31/2022   LDLCALC 68 05/31/2022   LDLCALC NOT CALCULATED 05/24/2022    Therapeutic Lab Levels: Lab Results  Component Value Date   LITHIUM <0.06 (L) 02/09/2021   No results found for: "VALPROATE" Lab Results  Component Value Date   CBMZ <0.3 (L) 11/19/2010    Physical Findings   AUDIT    Flowsheet Row ED from 05/29/2022 in [facility]  Alcohol Use Disorder Identification Test Final Score (AUDIT) 14      CAGE-AID    Flowsheet Row ED to Hosp-Admission (Discharged) from 04/20/2022 in [facility]  SURGICAL ED to Hosp-Admission (Discharged) from 02/07/2021 in [facility]  CAGE-AID Score 4 0      PHQ2-9    Flowsheet Row  ED from 06/10/2022 in [facility] ED from 05/29/2022 in [facility] ED from 01/12/2022 in [facility] ED  from 01/05/2022 in [facility] EMERGENCY DEPARTMENT  PHQ-2 Total Score 6 6 2 5   PHQ-9 Total Score 21 17 7 24       Flowsheet Row ED from 06/10/2022 in [facility] ED from 05/29/2022 in [facility] ED from 05/24/2022 in [facility]  C-SSRS RISK CATEGORY Moderate Risk No Risk High Risk        Musculoskeletal  Strength & Muscle Tone: within normal limits Gait & Station: normal Patient leans: N/A  Psychiatric Specialty Exam  Presentation  General Appearance: Appropriate for Environment; Casual  Eye Contact:Fair  Speech:Clear and Coherent  Speech Volume:Normal  Handedness:Right   Mood and Affect  Mood:Euthymic  Affect:Appropriate   Thought Process  Thought Processes:Coherent  Descriptions of Associations:Intact  Orientation:Full (Time, Place and Person)  Thought Content:Logical  Diagnosis of Schizophrenia or Schizoaffective disorder in past: No    Hallucinations:No data recorded  Ideas of Reference:None  Suicidal Thoughts:No data recorded  Homicidal Thoughts:No data recorded   Sensorium  Memory:Immediate Fair; Recent Fair  Judgment:Fair  Insight:Fair   Executive Functions  Concentration:Fair  Attention Span:Fair  Recall:Fair  Fund of Knowledge:Fair  Language:Fair   Psychomotor Activity  Psychomotor Activity:No data recorded   Assets  Assets:Desire for Improvement; Resilience   Sleep  Sleep:No data recorded   No data recorded  Physical Exam  Physical Exam Constitutional:      Appearance: Normal appearance.  HENT:     Head: Normocephalic and atraumatic.  Pulmonary:     Effort: Pulmonary effort is normal.  Neurological:     Mental Status: He is alert and oriented to person, place, and time.    Review of Systems  Psychiatric/Behavioral:  Negative for suicidal ideas. The patient is nervous/anxious and has insomnia.    Blood  pressure 118/68, pulse 66, temperature 98.3 F (36.8 C), temperature source Oral, resp. rate 16, SpO2 100 %. There is no height or weight on file to calculate BMI.  Treatment Plan Summary: Daily contact with patient to assess and evaluate symptoms and progress in treatment and Medication management   Patient symptoms appear to be improving, regarding EtOH withdrawal.  Patient will continue scheduled titration. 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

## 2022-06-15 DIAGNOSIS — F1020 Alcohol dependence, uncomplicated: Secondary | ICD-10-CM | POA: Diagnosis not present

## 2022-06-15 NOTE — ED Notes (Signed)
Patient A&Ox4. Denies intent to harm self/others when asked. Denies A/VH. Patient denies any physical complaints when asked. No acute distress noted. Routine safety checks conducted according to facility protocol. Encouraged patient to notify staff if thoughts of harm toward self or others arise. Patient verbalize understanding and agreement. Will continue to monitor for safety.    

## 2022-06-15 NOTE — Progress Notes (Signed)
Pt appears to be sleeping, breathing is even and unlabored

## 2022-06-15 NOTE — ED Notes (Signed)
Patient A&O x 4, ambulatory. Patient discharged in no acute distress. Patient denied SI/HI, A/VH upon discharge. Patient verbalized understanding of all discharge instructions explained by staff, to include follow up appointments, RX's and safety plan. Pt belongings returned to patient from locker #1 intact. Patient escorted to lobby via staff for transport to [employer] via taxi. Safety maintained.

## 2022-06-15 NOTE — Progress Notes (Signed)
Patient reports that he is leaving tomorrow and is "going back to the shelter" but that he "has a case worker who is going to help him find more permanent housing, maybe in an assisted living facility somewhere."  Patient was in a good mood, smiling and initiating conversation with this Clinical research associate.  He is pleasant and cooperative with care.

## 2022-06-15 NOTE — Clinical Social Work Psych Note (Signed)
LCSW Update Note   [name] has completed his Ativan taper for alcohol withdrawal symptoms.   [name] continues to express interest in long term residential treatment services, however he has expressed that as an alternative he plans to return to [facility].   LCSW has provided [name] with residential treatment resources, as well as resources for outpatient psychitric and therapy services.   LCSW has approved a taxi voucher to provide transportation assistance.   LCSW signing off.     Baldo Daub, MSW, LCSW Clinical Child psychotherapist (Facility Based Crisis) [facility]
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
## 2022-06-15 NOTE — ED Notes (Signed)
Pt sleeping in no acute distress. RR even and unlabored. Safety maintained. 

## 2022-06-24 ENCOUNTER — Inpatient Hospital Stay: Payer: Self-pay | Admitting: Internal Medicine

## 2022-07-06 ENCOUNTER — Encounter (HOSPITAL_COMMUNITY): Payer: Self-pay | Admitting: Registered Nurse

## 2022-07-06 ENCOUNTER — Other Ambulatory Visit (HOSPITAL_COMMUNITY)
Admission: EM | Admit: 2022-07-06 | Discharge: 2022-07-07 | Disposition: A | Discharge status: Home / Self Care | Payer: Medicare Other | Attending: Psychiatry | Admitting: Psychiatry

## 2022-07-06 DIAGNOSIS — K432 Incisional hernia without obstruction or gangrene: Secondary | ICD-10-CM

## 2022-07-06 DIAGNOSIS — F32A Depression, unspecified: Secondary | ICD-10-CM | POA: Insufficient documentation

## 2022-07-06 DIAGNOSIS — F1094 Alcohol use, unspecified with alcohol-induced mood disorder: Secondary | ICD-10-CM

## 2022-07-06 DIAGNOSIS — F1090 Alcohol use, unspecified, uncomplicated: Secondary | ICD-10-CM

## 2022-07-06 DIAGNOSIS — R45851 Suicidal ideations: Secondary | ICD-10-CM | POA: Diagnosis not present

## 2022-07-06 DIAGNOSIS — D696 Thrombocytopenia, unspecified: Secondary | ICD-10-CM

## 2022-07-06 DIAGNOSIS — F10139 Alcohol abuse with withdrawal, unspecified: Secondary | ICD-10-CM

## 2022-07-06 DIAGNOSIS — Z9151 Personal history of suicidal behavior: Secondary | ICD-10-CM | POA: Insufficient documentation

## 2022-07-06 DIAGNOSIS — Z20822 Contact with and (suspected) exposure to covid-19: Secondary | ICD-10-CM | POA: Diagnosis not present

## 2022-07-06 DIAGNOSIS — I10 Essential (primary) hypertension: Secondary | ICD-10-CM

## 2022-07-06 DIAGNOSIS — F609 Personality disorder, unspecified: Secondary | ICD-10-CM

## 2022-07-06 DIAGNOSIS — K7030 Alcoholic cirrhosis of liver without ascites: Secondary | ICD-10-CM

## 2022-07-06 LAB — POCT URINE DRUG SCREEN - MANUAL ENTRY (I-SCREEN)
POC Amphetamine UR: NOT DETECTED
POC Buprenorphine (BUP): NOT DETECTED
POC Cocaine UR: NOT DETECTED
POC Marijuana UR: NOT DETECTED
POC Methadone UR: NOT DETECTED
POC Methamphetamine UR: NOT DETECTED
POC Morphine: NOT DETECTED
POC Oxazepam (BZO): NOT DETECTED
POC Oxycodone UR: NOT DETECTED
POC Secobarbital (BAR): NOT DETECTED

## 2022-07-06 LAB — COMPREHENSIVE METABOLIC PANEL
ALT: 27 U/L (ref 0–44)
AST: 33 U/L (ref 15–41)
Albumin: 3.8 g/dL (ref 3.5–5.0)
Alkaline Phosphatase: 64 U/L (ref 38–126)
Anion gap: 12 (ref 5–15)
BUN: 14 mg/dL (ref 8–23)
CO2: 22 mmol/L (ref 22–32)
Calcium: 9.2 mg/dL (ref 8.9–10.3)
Chloride: 104 mmol/L (ref 98–111)
Creatinine, Ser: 0.96 mg/dL (ref 0.61–1.24)
GFR, Estimated: >60 mL/min (ref >60)
Glucose, Bld: 78 mg/dL (ref 70–99)
Potassium: 4 mmol/L (ref 3.5–5.1)
Sodium: 138 mmol/L (ref 135–145)
Total Bilirubin: 2 mg/dL — ABNORMAL HIGH (ref 0.3–1.2)
Total Protein: 8.6 g/dL — ABNORMAL HIGH (ref 6.5–8.1)

## 2022-07-06 LAB — URINALYSIS, ROUTINE W REFLEX MICROSCOPIC
Bacteria, UA: NONE SEEN
Bilirubin Urine: NEGATIVE
Glucose, UA: NEGATIVE mg/dL
Hgb urine dipstick: NEGATIVE
Ketones, ur: 20 mg/dL — AB
Leukocytes,Ua: NEGATIVE
Nitrite: NEGATIVE
Protein, ur: 30 mg/dL — AB
Specific Gravity, Urine: 1.019 (ref 1.005–1.030)
pH: 5 (ref 5.0–8.0)

## 2022-07-06 LAB — CBC WITH DIFFERENTIAL/PLATELET
Abs Immature Granulocytes: 0.04 10*3/uL (ref 0.00–0.07)
Basophils Absolute: 0.1 10*3/uL (ref 0.0–0.1)
Basophils Relative: 1 %
Eosinophils Absolute: 0.4 10*3/uL (ref 0.0–0.5)
Eosinophils Relative: 3 %
HCT: 41.9 % (ref 39.0–52.0)
Hemoglobin: 12.8 g/dL — ABNORMAL LOW (ref 13.0–17.0)
Immature Granulocytes: 0 %
Lymphocytes Relative: 21 %
Lymphs Abs: 2.8 10*3/uL (ref 0.7–4.0)
MCH: 22.3 pg — ABNORMAL LOW (ref 26.0–34.0)
MCHC: 30.5 g/dL (ref 30.0–36.0)
MCV: 73 fL — ABNORMAL LOW (ref 80.0–100.0)
Monocytes Absolute: 1 10*3/uL (ref 0.1–1.0)
Monocytes Relative: 8 %
Neutro Abs: 9.1 10*3/uL — ABNORMAL HIGH (ref 1.7–7.7)
Neutrophils Relative %: 67 %
Platelets: 87 10*3/uL — ABNORMAL LOW (ref 150–400)
RBC: 5.74 MIL/uL (ref 4.22–5.81)
RDW: 27.9 % — ABNORMAL HIGH (ref 11.5–15.5)
WBC: 13.5 10*3/uL — ABNORMAL HIGH (ref 4.0–10.5)
nRBC: 0 % (ref 0.0–0.2)

## 2022-07-06 LAB — RESP PANEL BY RT-PCR (FLU A&B, COVID) ARPGX2
Influenza A by PCR: NEGATIVE
Influenza B by PCR: NEGATIVE
SARS Coronavirus 2 by RT PCR: NEGATIVE

## 2022-07-06 LAB — POC SARS CORONAVIRUS 2 AG: SARSCOV2ONAVIRUS 2 AG: NEGATIVE

## 2022-07-06 LAB — GLUCOSE, CAPILLARY: Glucose-Capillary: 83 mg/dL (ref 70–99)

## 2022-07-06 LAB — ETHANOL: Alcohol, Ethyl (B): <10 mg/dL (ref <10)

## 2022-07-06 LAB — MAGNESIUM: Magnesium: 2 mg/dL (ref 1.7–2.4)

## 2022-07-06 MED ORDER — QUETIAPINE FUMARATE 200 MG PO TABS
200.0000 mg | ORAL_TABLET | Freq: Every day | ORAL | Status: DC
  Administered 2022-07-06: 200 mg via ORAL
  Filled 2022-07-06: qty 1

## 2022-07-06 MED ORDER — CHLORDIAZEPOXIDE HCL 25 MG PO CAPS: 25.0000 mg | ORAL_CAPSULE | Freq: Four times a day (QID) | ORAL | Status: DC | PRN

## 2022-07-06 MED ORDER — ACETAMINOPHEN 325 MG PO TABS
650.0000 mg | ORAL_TABLET | Freq: Four times a day (QID) | ORAL | Status: DC | PRN
  Administered 2022-07-08: 650 mg via ORAL

## 2022-07-06 MED ORDER — LOPERAMIDE HCL 2 MG PO CAPS
2.0000 mg | ORAL_CAPSULE | ORAL | Status: DC | PRN
  Administered 2022-07-08: 4 mg via ORAL
  Filled 2022-07-06: qty 2

## 2022-07-06 MED ORDER — HYDROXYZINE HCL 25 MG PO TABS: 25.0000 mg | ORAL_TABLET | Freq: Four times a day (QID) | ORAL | Status: DC | PRN

## 2022-07-06 MED ORDER — MAGNESIUM HYDROXIDE 400 MG/5ML PO SUSP: 30.0000 mL | Freq: Every day | ORAL | Status: DC | PRN

## 2022-07-06 MED ORDER — AMLODIPINE BESYLATE 10 MG PO TABS
10.0000 mg | ORAL_TABLET | Freq: Every day | ORAL | Status: DC
  Administered 2022-07-06 – 2022-07-08 (×3): 10 mg via ORAL
  Filled 2022-07-06 (×3): qty 1

## 2022-07-06 MED ORDER — ONDANSETRON 4 MG PO TBDP
4.0000 mg | ORAL_TABLET | Freq: Four times a day (QID) | ORAL | Status: DC | PRN
  Administered 2022-07-06: 4 mg via ORAL
  Filled 2022-07-06: qty 1

## 2022-07-06 MED ORDER — ALUM & MAG HYDROXIDE-SIMETH 200-200-20 MG/5ML PO SUSP
30.0000 mL | ORAL | Status: DC | PRN
  Administered 2022-07-08: 30 mL via ORAL
  Filled 2022-07-06: qty 30

## 2022-07-06 MED ORDER — FERROUS SULFATE 325 (65 FE) MG PO TABS
325.0000 mg | ORAL_TABLET | Freq: Every day | ORAL | Status: DC
  Administered 2022-07-07 – 2022-07-08 (×2): 325 mg via ORAL
  Filled 2022-07-06 (×2): qty 1

## 2022-07-06 MED ORDER — THIAMINE MONONITRATE 100 MG PO TABS
100.0000 mg | ORAL_TABLET | Freq: Every day | ORAL | Status: DC
  Administered 2022-07-07 – 2022-07-08 (×2): 100 mg via ORAL
  Filled 2022-07-06 (×2): qty 1

## 2022-07-06 MED ORDER — GABAPENTIN 300 MG PO CAPS
300.0000 mg | ORAL_CAPSULE | Freq: Three times a day (TID) | ORAL | Status: DC
  Administered 2022-07-06 – 2022-07-08 (×6): 300 mg via ORAL
  Filled 2022-07-06 (×6): qty 1

## 2022-07-06 MED ORDER — ADULT MULTIVITAMIN W/MINERALS CH
1.0000 | ORAL_TABLET | Freq: Every day | ORAL | Status: DC
  Administered 2022-07-06 – 2022-07-08 (×3): 1 via ORAL
  Filled 2022-07-06 (×3): qty 1

## 2022-07-06 NOTE — ED Notes (Signed)
Patient arrived on unit, patient cooperative., Patient redirectable. Patient safe on unit with continued observation.

## 2022-07-06 NOTE — ED Provider Notes (Addendum)
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  Average Risk       5.0   4.4  2 X Average Risk   9.6   7.1  3 X Average Risk  23.4   11.0        Use the calculated Patient  Ratio above and the CHD Risk Table to determine the patient's CHD Risk.        ATP III CLASSIFICATION (LDL):  <100     mg/dL   Optimal  366-440  mg/dL   Near or Above                    Optimal  130-159  mg/dL   Borderline  347-425  mg/dL   High  >956     mg/dL   Very High Performed at [facility], [address], [place], [place] [address]   Admission on 05/24/2022, Discharged on 05/25/2022  Component Date Value Ref Range Status   SARS Coronavirus 2 by RT PCR 05/24/2022 NEGATIVE  NEGATIVE Final   Comment: (NOTE) SARS-CoV-2 target nucleic acids are NOT DETECTED.  The SARS-CoV-2 RNA is generally detectable in upper respiratory specimens during the acute phase of  infection. The lowest concentration of SARS-CoV-2 viral copies this assay can detect is 138 copies/mL. A negative result does not preclude SARS-Cov-2 infection and should not be used as the sole basis for treatment or other patient management decisions. A negative result may occur with  improper specimen collection/handling, submission of specimen other than nasopharyngeal swab, presence of viral mutation(s) within the areas targeted by this assay, and inadequate number of viral copies(<138 copies/mL). A negative result must be combined with clinical observations, patient history, and epidemiological information. The expected result is Negative.  Fact Sheet for Patients:  BloggerCourse.com  Fact Sheet for Healthcare Providers:  SeriousBroker.it  This test is no                          t yet approved or cleared by the [place] FDA and  has been authorized for detection and/or diagnosis of SARS-CoV-2 by FDA under an Emergency Use Authorization (EUA). This EUA will remain  in effect (meaning this test can be used) for the duration of the COVID-19 declaration under Section 564(b)(1) of the Act, 21 U.S.C.section 360bbb-3(b)(1), unless the authorization is  terminated  or revoked sooner.       Influenza A by PCR 05/24/2022 NEGATIVE  NEGATIVE Final   Influenza B by PCR 05/24/2022 NEGATIVE  NEGATIVE Final   Comment: (NOTE) The Xpert Xpress SARS-CoV-2/FLU/RSV plus assay is intended as an aid in the diagnosis of influenza from Nasopharyngeal swab specimens and should not be used as a sole basis for treatment. Nasal washings and aspirates are unacceptable for Xpert Xpress SARS-CoV-2/FLU/RSV testing.  Fact Sheet for Patients: BloggerCourse.com  Fact Sheet for Healthcare Providers: SeriousBroker.it  This test is not yet approved or cleared by the [place] FDA and has been authorized for detection and/or diagnosis of SARS-CoV-2 by FDA under an Emergency Use Authorization (EUA). This EUA will remain in effect (meaning this test can be used) for the duration of the COVID-19 declaration under Section 564(b)(1) of the Act, 21 U.S.C. section 360bbb-3(b)(1), unless the authorization is terminated or revoked.  Performed at [facility], [address] [place], [place] [address]    WBC 05/24/2022 5.3  4.0 - 10.5 K/uL Final   RBC 05/24/2022 3.98 (L)  4.22 - 5.81 MIL/uL Final   Hemoglobin 05/24/2022 8.4 (L)  13.0 - 17.0 g/dL Final   Comment: Reticulocyte Hemoglobin testing may be clinically indicated, consider ordering this additional test [id number]    HCT 05/24/2022 27.4 (L)  39.0 - 52.0 % Final   MCV 05/24/2022 68.8 (L)  80.0 - 100.0 fL Final   MCH 05/24/2022 21.1 (L)  26.0 - 34.0 pg Final   MCHC 05/24/2022 30.7  30.0 - 36.0 g/dL Final   RDW 11/91/4782 19.3 (H)  11.5 - 15.5 % Final   Platelets 05/24/2022 149 (L)  150 - 400 K/uL Final   REPEATED TO VERIFY   nRBC 05/24/2022 0.0  0.0 - 0.2 % Final   Neutrophils Relative % 05/24/2022 39  % Final   Neutro Abs 05/24/2022 2.0  1.7 - 7.7 K/uL Final   Lymphocytes Relative 05/24/2022 45  % Final   Lymphs Abs 05/24/2022 2.4  0.7 - 4.0  K/uL Final   Monocytes Relative 05/24/2022 11  % Final   Monocytes Absolute 05/24/2022 0.6  0.1 - 1.0 K/uL Final   Eosinophils Relative 05/24/2022 3  % Final   Eosinophils Absolute 05/24/2022 0.2  0.0 - 0.5 K/uL Final  Basophils Relative 05/24/2022 2  % Final   Basophils Absolute 05/24/2022 0.1  0.0 - 0.1 K/uL Final   Immature Granulocytes 05/24/2022 0  % Final   Abs Immature Granulocytes 05/24/2022 0.01  0.00 - 0.07 K/uL Final   Performed at [facility], [place] [address], [place], [place] [address]   Sodium 05/24/2022 139  135 - 145 mmol/L Final   Potassium 05/24/2022 3.9  3.5 - 5.1 mmol/L Final   Chloride 05/24/2022 108  98 - 111 mmol/L Final   CO2 05/24/2022 21 (L)  22 - 32 mmol/L Final   Glucose, Bld 05/24/2022 100 (H)  70 - 99 mg/dL Final   Glucose reference range applies only to samples taken after fasting for at least 8 hours.   BUN 05/24/2022 12  8 - 23 mg/dL Final   Creatinine, Ser 05/24/2022 1.04  0.61 - 1.24 mg/dL Final   Calcium 05/24/2022 9.0  8.9 - 10.3 mg/dL Final   Total Protein 05/24/2022 7.4  6.5 - 8.1 g/dL Final   Albumin 05/24/2022 3.6  3.5 - 5.0 g/dL Final   AST 05/24/2022 36  15 - 41 U/L Final   ALT 05/24/2022 26  0 - 44 U/L Final   Alkaline Phosphatase 05/24/2022 77  38 - 126 U/L Final   Total Bilirubin 05/24/2022 1.2  0.3 - 1.2 mg/dL Final   GFR, Estimated 05/24/2022 >60  >60 mL/min Final   Comment: (NOTE) Calculated using the CKD-EPI Creatinine Equation (2021)    Anion gap 05/24/2022 10  5 - 15 Final   Performed at [place] [address], [place], [place] [address]   Hgb A1c MFr Bld 05/24/2022 5.2  4.8 - 5.6 % Final   Comment: (NOTE) Pre diabetes:          5.7%-6.4%  Diabetes:              >6.4%  Glycemic control for   <7.0% adults with diabetes    Mean Plasma Glucose 05/24/2022 102.54  mg/dL Final   Performed at [facility], [place] [address] [place], [place] [address]   Alcohol, Ethyl (B) 05/24/2022 99 (H)  <10 mg/dL Final    Comment: (NOTE) Lowest detectable limit for serum alcohol is 10 mg/dL.  For medical purposes only. Performed at [facility], [place] [address] [place], [place] [address]    Hepatitis B Surface Ag 05/24/2022 NON REACTIVE  NON REACTIVE Final   HCV Ab 05/24/2022 Reactive (A)  NON REACTIVE Final   Comment: (NOTE) The CDC recommends that a Reactive HCV antibody result be followed up  with a HCV Nucleic Acid Amplification test.     Hep A IgM 05/24/2022 NON REACTIVE  NON REACTIVE Final   Hep B C IgM 05/24/2022 NON REACTIVE  NON REACTIVE Final   Performed at [facility], [place] [address], [place], [place] [address]   POC Amphetamine UR 05/25/2022 None Detected  NONE DETECTED (Cut Off Level 1000 ng/mL) Final   POC Secobarbital (BAR) 05/25/2022 None Detected  NONE DETECTED (Cut Off Level 300 ng/mL) Final   POC Buprenorphine (BUP) 05/25/2022 None Detected  NONE DETECTED (Cut Off Level 10 ng/mL) Final   POC Oxazepam (BZO) 05/25/2022 Positive (A)  NONE DETECTED (Cut Off Level 300 ng/mL) Final   POC Cocaine UR 05/25/2022 Positive (A)  NONE DETECTED (Cut Off Level 300 ng/mL) Final   POC Methamphetamine UR 05/25/2022 None Detected  NONE DETECTED (Cut Off Level 1000 ng/mL)  Final   POC Morphine 05/25/2022 None Detected  NONE DETECTED (Cut Off Level 300 ng/mL) Final   POC Methadone UR 05/25/2022 None Detected  NONE DETECTED (Cut Off Level 300 ng/mL) Final   POC Oxycodone UR 05/25/2022 None Detected  NONE DETECTED (Cut Off Level 100 ng/mL) Final   POC Marijuana UR 05/25/2022 None Detected  NONE DETECTED (Cut Off Level 50 ng/mL) Final   SARSCOV2ONAVIRUS 2 AG 05/24/2022 NEGATIVE  NEGATIVE Final   Comment: (NOTE) SARS-CoV-2 antigen NOT DETECTED.   Negative results are presumptive.  Negative results do not preclude SARS-CoV-2 infection and should not be used as the sole basis for treatment or other patient management decisions, including infection  control decisions, particularly in the presence  of clinical signs and  symptoms consistent with COVID-19, or in those who have been in contact with the virus.  Negative results must be combined with clinical observations, patient history, and epidemiological information. The expected result is Negative.  Fact Sheet for Patients: https://www.jennings-kim.com/https://www.fda.gov/media/141569/download  Fact Sheet for Healthcare Providers: https://alexander-rogers.biz/https://www.fda.gov/media/141568/download  This test is not yet approved or cleared by the [place] FDA and  has been authorized for detection and/or diagnosis of SARS-CoV-2 by FDA under an Emergency Use Authorization (EUA).  This EUA will remain in effect (meaning this test can be used) for the duration of  the COV                          ID-19 declaration under Section 564(b)(1) of the Act, 21 U.S.C. section 360bbb-3(b)(1), unless the authorization is terminated or revoked sooner.     Cholesterol 05/24/2022 141  0 - 200 mg/dL Final   Triglycerides 16/10/960408/05/2022 94  <150 mg/dL Final   HDL 54/09/811908/05/2022 57  >40 mg/dL Final   Total CHOL/HDL Ratio 05/24/2022 2.5  RATIO Final   VLDL 05/24/2022 19  0 - 40 mg/dL Final   LDL Cholesterol 05/24/2022 NOT CALCULATED  0 - 99 mg/dL Final   Performed at [facility], [address] [place], [place] [address]   TSH 05/24/2022 2.834  0.350 - 4.500 uIU/mL Final   Comment: Performed by a 3rd Generation assay with a functional sensitivity of <=0.01 uIU/mL. Performed at [facility], [address] [place], [place] [address]   Admission on 05/10/2022, Discharged on 05/11/2022  Component Date Value Ref Range Status   SARS Coronavirus 2 by RT PCR 05/10/2022 NEGATIVE  NEGATIVE Final   Comment: (NOTE) SARS-CoV-2 target nucleic acids are NOT DETECTED.  The SARS-CoV-2 RNA is generally detectable in upper respiratory specimens during the acute phase of infection. The lowest concentration of SARS-CoV-2 viral copies this assay can detect is 138 copies/mL. A negative result does not  preclude SARS-Cov-2 infection and should not be used as the sole basis for treatment or other patient management decisions. A negative result may occur with  improper specimen collection/handling, submission of specimen other than nasopharyngeal swab, presence of viral mutation(s) within the areas targeted by this assay, and inadequate number of viral copies(<138 copies/mL). A negative result must be combined with clinical observations, patient history, and epidemiological information. The expected result is Negative.  Fact Sheet for Patients:  BloggerCourse.comhttps://www.fda.gov/media/152166/download  Fact Sheet for Healthcare Providers:  SeriousBroker.ithttps://www.fda.gov/media/152162/download  This test is no                          t yet approved or cleared by the [place] FDA  and  has been authorized for detection and/or diagnosis of SARS-CoV-2 by FDA under an Emergency Use Authorization (EUA). This EUA will remain  in effect (meaning this test can be used) for the duration of the COVID-19 declaration under Section 564(b)(1) of the Act, 21 U.S.C.section 360bbb-3(b)(1), unless the authorization is terminated  or revoked sooner.       Influenza A by PCR 05/10/2022 NEGATIVE  NEGATIVE Final   Influenza B by PCR 05/10/2022 NEGATIVE  NEGATIVE Final   Comment: (NOTE) The Xpert Xpress SARS-CoV-2/FLU/RSV plus assay is intended as an aid in the diagnosis of influenza from Nasopharyngeal swab specimens and should not be used as a sole basis for treatment. Nasal washings and aspirates are unacceptable for Xpert Xpress SARS-CoV-2/FLU/RSV testing.  Fact Sheet for Patients: BloggerCourse.com  Fact Sheet for Healthcare Providers: SeriousBroker.it  This test is not yet approved or cleared by the [place] FDA and has been authorized for detection and/or diagnosis of SARS-CoV-2 by FDA under an Emergency Use Authorization (EUA). This EUA will remain in  effect (meaning this test can be used) for the duration of the COVID-19 declaration under Section 564(b)(1) of the Act, 21 U.S.C. section 360bbb-3(b)(1), unless the authorization is terminated or revoked.  Performed at [facility], [address], [place], [place] [address]    Sodium 05/10/2022 137  135 - 145 mmol/L Final   Potassium 05/10/2022 3.7  3.5 - 5.1 mmol/L Final   Chloride 05/10/2022 106  98 - 111 mmol/L Final   CO2 05/10/2022 23  22 - 32 mmol/L Final   Glucose, Bld 05/10/2022 90  70 - 99 mg/dL Final   Glucose reference range applies only to samples taken after fasting for at least 8 hours.   BUN 05/10/2022 13  8 - 23 mg/dL Final   Creatinine, Ser 05/10/2022 1.04  0.61 - 1.24 mg/dL Final   Calcium 21/19/4174 8.8 (L)  8.9 - 10.3 mg/dL Final   Total Protein 05/30/4817 7.8  6.5 - 8.1 g/dL Final   Albumin 56/31/4970 3.6  3.5 - 5.0 g/dL Final   AST 26/37/8588 30  15 - 41 U/L Final   ALT 05/10/2022 21  0 - 44 U/L Final   Alkaline Phosphatase 05/10/2022 62  38 - 126 U/L Final   Total Bilirubin 05/10/2022 0.8  0.3 - 1.2 mg/dL Final   GFR, Estimated 05/10/2022 >60  >60 mL/min Final   Comment: (NOTE) Calculated using the CKD-EPI Creatinine Equation (2021)    Anion gap 05/10/2022 8  5 - 15 Final   Performed at [facility], [address], [place], [place] [address]   Alcohol, Ethyl (B) 05/10/2022 156 (H)  <10 mg/dL Final   Comment: (NOTE) Lowest detectable limit for serum alcohol is 10 mg/dL.  For medical purposes only. Performed at [facility], [address], [place], [place] [address]    Opiates 05/10/2022 POSITIVE (A)  NONE DETECTED Final   Cocaine 05/10/2022 POSITIVE (A)  NONE DETECTED Final   Benzodiazepines 05/10/2022 POSITIVE (A)  NONE DETECTED Final   Amphetamines 05/10/2022 NONE DETECTED  NONE DETECTED Final   Tetrahydrocannabinol 05/10/2022 NONE DETECTED  NONE DETECTED Final   Barbiturates 05/10/2022 NONE  DETECTED  NONE DETECTED Final   Comment: (NOTE) DRUG SCREEN FOR MEDICAL PURPOSES ONLY.  IF CONFIRMATION IS NEEDED FOR ANY PURPOSE, NOTIFY LAB WITHIN 5 DAYS.  LOWEST DETECTABLE LIMITS FOR URINE DRUG SCREEN Drug Class  Cutoff (ng/mL) Amphetamine and metabolites    1000 Barbiturate and metabolites    200 Benzodiazepine                 200 Tricyclics and metabolites     300 Opiates and metabolites        300 Cocaine and metabolites        300 THC                            50 Performed at [facility], [address] [place], [place] [address]    WBC 05/10/2022 4.5  4.0 - 10.5 K/uL Final   RBC 05/10/2022 3.77 (L)  4.22 - 5.81 MIL/uL Final   Hemoglobin 05/10/2022 8.3 (L)  13.0 - 17.0 g/dL Final   Comment: Reticulocyte Hemoglobin testing may be clinically indicated, consider ordering this additional test [id number]    HCT 05/10/2022 27.3 (L)  39.0 - 52.0 % Final   MCV 05/10/2022 72.4 (L)  80.0 - 100.0 fL Final   MCH 05/10/2022 22.0 (L)  26.0 - 34.0 pg Final   MCHC 05/10/2022 30.4  30.0 - 36.0 g/dL Final   RDW 11/91/4782 19.8 (H)  11.5 - 15.5 % Final   Platelets 05/10/2022 162  150 - 400 K/uL Final   nRBC 05/10/2022 0.0  0.0 - 0.2 % Final   Neutrophils Relative % 05/10/2022 55  % Final   Neutro Abs 05/10/2022 2.5  1.7 - 7.7 K/uL Final   Lymphocytes Relative 05/10/2022 32  % Final   Lymphs Abs 05/10/2022 1.4  0.7 - 4.0 K/uL Final   Monocytes Relative 05/10/2022 10  % Final   Monocytes Absolute 05/10/2022 0.4  0.1 - 1.0 K/uL Final   Eosinophils Relative 05/10/2022 2  % Final   Eosinophils Absolute 05/10/2022 0.1  0.0 - 0.5 K/uL Final   Basophils Relative 05/10/2022 1  % Final   Basophils Absolute 05/10/2022 0.1  0.0 - 0.1 K/uL Final   Immature Granulocytes 05/10/2022 0  % Final   Abs Immature Granulocytes 05/10/2022 0.01  0.00 - 0.07 K/uL Final   Performed at [facility], [address], [place], [place] [address]    Salicylate Lvl 05/10/2022 <7.0 (L)  7.0 - 30.0 mg/dL Final   Performed at [facility], [address] [place], [place] [address]   Acetaminophen (Tylenol), Serum 05/10/2022 <10 (L)  10 - 30 ug/mL Final   Comment: (NOTE) Therapeutic concentrations vary significantly. A range of 10-30 ug/mL  may be an effective concentration for many patients. However, some  are best treated at concentrations outside of this range. Acetaminophen concentrations >150 ug/mL at 4 hours after ingestion  and >50 ug/mL at 12 hours after ingestion are often associated with  toxic reactions.  Performed at [facility], [address] [place], [place] [address]    Magnesium 05/10/2022 2.0  1.7 - 2.4 mg/dL Final   Performed at [facility], [address], [place], [place] [address]   Color, Urine 05/10/2022 STRAW (A)  YELLOW Final   APPearance 05/10/2022 CLEAR  CLEAR Final   Specific Gravity, Urine 05/10/2022 1.005  1.005 - 1.030 Final   pH 05/10/2022 5.0  5.0 - 8.0 Final   Glucose, UA 05/10/2022 NEGATIVE  NEGATIVE mg/dL Final   Hgb urine dipstick 05/10/2022 NEGATIVE  NEGATIVE Final   Bilirubin Urine 05/10/2022 NEGATIVE  NEGATIVE Final   Ketones, ur 05/10/2022  NEGATIVE  NEGATIVE mg/dL Final   Protein, ur 16/07/9603 NEGATIVE  NEGATIVE mg/dL Final   Nitrite 54/06/8118 NEGATIVE  NEGATIVE Final   Leukocytes,Ua 05/10/2022 NEGATIVE  NEGATIVE Final   Performed at [facility], [address] [place], [place] [address]  Admission on 05/05/2022, Discharged on 05/06/2022  Component Date Value Ref Range Status   WBC 05/05/2022 7.3  4.0 - 10.5 K/uL Final   RBC 05/05/2022 3.68 (L)  4.22 - 5.81 MIL/uL Final   Hemoglobin 05/05/2022 8.2 (L)  13.0 - 17.0 g/dL Final   Comment: Reticulocyte Hemoglobin testing may be clinically indicated, consider ordering this additional test [id number]    HCT 05/05/2022 27.1 (L)  39.0 - 52.0 % Final   MCV  05/05/2022 73.6 (L)  80.0 - 100.0 fL Final   MCH 05/05/2022 22.3 (L)  26.0 - 34.0 pg Final   MCHC 05/05/2022 30.3  30.0 - 36.0 g/dL Final   RDW 65/78/4696 19.6 (H)  11.5 - 15.5 % Final   Platelets 05/05/2022 234  150 - 400 K/uL Final   nRBC 05/05/2022 0.0  0.0 - 0.2 % Final   Neutrophils Relative % 05/05/2022 44  % Final   Neutro Abs 05/05/2022 3.2  1.7 - 7.7 K/uL Final   Lymphocytes Relative 05/05/2022 38  % Final   Lymphs Abs 05/05/2022 2.8  0.7 - 4.0 K/uL Final   Monocytes Relative 05/05/2022 13  % Final   Monocytes Absolute 05/05/2022 0.9  0.1 - 1.0 K/uL Final   Eosinophils Relative 05/05/2022 4  % Final   Eosinophils Absolute 05/05/2022 0.3  0.0 - 0.5 K/uL Final   Basophils Relative 05/05/2022 1  % Final   Basophils Absolute 05/05/2022 0.1  0.0 - 0.1 K/uL Final   Immature Granulocytes 05/05/2022 0  % Final   Abs Immature Granulocytes 05/05/2022 0.02  0.00 - 0.07 K/uL Final   Performed at [facility], [address] [place], [place] [address]   Sodium 05/05/2022 138  135 - 145 mmol/L Final   Potassium 05/05/2022 3.8  3.5 - 5.1 mmol/L Final   Chloride 05/05/2022 109  98 - 111 mmol/L Final   CO2 05/05/2022 17 (L)  22 - 32 mmol/L Final   Glucose, Bld 05/05/2022 91  70 - 99 mg/dL Final   Glucose reference range applies only to samples taken after fasting for at least 8 hours.   BUN 05/05/2022 9  8 - 23 mg/dL Final   Creatinine, Ser 05/05/2022 0.87  0.61 - 1.24 mg/dL Final   Calcium 41/32/4401 8.6 (L)  8.9 - 10.3 mg/dL Final   Total Protein 02/72/5366 7.9  6.5 - 8.1 g/dL Final   Albumin 44/12/4740 3.3 (L)  3.5 - 5.0 g/dL Final   AST 59/56/3875 33  15 - 41 U/L Final   ALT 05/05/2022 23  0 - 44 U/L Final   Alkaline Phosphatase 05/05/2022 60  38 - 126 U/L Final   Total Bilirubin 05/05/2022 0.9  0.3 - 1.2 mg/dL Final   GFR, Estimated 05/05/2022 >60  >60 mL/min Final   Comment: (NOTE) Calculated using the CKD-EPI Creatinine Equation (2021)    Anion gap 05/05/2022 12  5 - 15  Final   Performed at [facility], [address], [place], [place] [address]   Alcohol, Ethyl (B) 05/05/2022 169 (H)  <10 mg/dL Final   Comment: (NOTE) Lowest detectable limit for serum alcohol is 10 mg/dL.  For medical purposes only. Performed at [facility], [address] [place], [place] [address]    Salicylate Lvl 05/05/2022 <7.0 (L)  7.0 - 30.0 mg/dL Final   Performed at [facility], [address], [place], [place] [address]   Acetaminophen (Tylenol), Serum 05/05/2022 <10 (L)  10 - 30 ug/mL Final   Comment: (NOTE) Therapeutic concentrations vary significantly. A range of 10-30 ug/mL  may be an effective concentration for many patients. However, some  are best treated at concentrations outside of this range. Acetaminophen concentrations >150 ug/mL at 4 hours after ingestion  and >50 ug/mL at 12 hours after ingestion are often associated with  toxic reactions.  Performed at [facility], [address], [place], [place] [address]    Opiates 05/06/2022 NONE DETECTED  NONE DETECTED Final   Cocaine 05/06/2022 NONE DETECTED  NONE DETECTED Final   Benzodiazepines 05/06/2022 POSITIVE (A)  NONE DETECTED Final   Amphetamines 05/06/2022 NONE DETECTED  NONE DETECTED Final   Tetrahydrocannabinol 05/06/2022 NONE DETECTED  NONE DETECTED Final   Barbiturates 05/06/2022 NONE DETECTED  NONE DETECTED Final   Comment: (NOTE) DRUG SCREEN FOR MEDICAL PURPOSES ONLY.  IF CONFIRMATION IS NEEDED FOR ANY PURPOSE, NOTIFY LAB WITHIN 5 DAYS.  LOWEST DETECTABLE LIMITS FOR URINE DRUG SCREEN Drug Class                     Cutoff (ng/mL) Amphetamine and metabolites    1000 Barbiturate and metabolites    200 Benzodiazepine                 200 Tricyclics and metabolites     300 Opiates and metabolites        300 Cocaine and metabolites        300 THC                            50 Performed at [facility], [address], [place], [place] [address]    SARS  Coronavirus 2 by RT PCR 05/06/2022 NEGATIVE  NEGATIVE Final   Comment: (NOTE) SARS-CoV-2 target nucleic acids are NOT DETECTED.  The SARS-CoV-2 RNA is generally detectable in upper respiratory specimens during the acute phase of infection. The lowest concentration of SARS-CoV-2 viral copies this assay can detect is 138 copies/mL. A negative result does not preclude SARS-Cov-2 infection and should not be used as the sole basis for treatment or other patient management decisions. A negative result may occur with  improper specimen collection/handling, submission of specimen other than nasopharyngeal swab, presence of viral mutation(s) within the areas targeted by this assay, and inadequate number of viral copies(<138 copies/mL). A negative result must be combined with clinical observations, patient history, and epidemiological information. The expected result is Negative.  Fact Sheet for Patients:  BloggerCourse.com  Fact Sheet for Healthcare Providers:  SeriousBroker.it  This test is no                          t yet approved or cleared by the [place] FDA and  has been authorized for detection and/or diagnosis of SARS-CoV-2 by FDA under an Emergency Use Authorization (EUA). This EUA will remain  in effect (meaning this test can be used) for the duration of the COVID-19 declaration under Section 564(b)(1) of the Act, 21 U.S.C.section 360bbb-3(b)(1), unless the authorization is terminated  or revoked sooner.       Influenza A by  PCR 05/06/2022 NEGATIVE  NEGATIVE Final   Influenza B by PCR 05/06/2022 NEGATIVE  NEGATIVE Final   Comment: (NOTE) The Xpert Xpress SARS-CoV-2/FLU/RSV plus assay is intended as an aid in the diagnosis of influenza from Nasopharyngeal swab specimens and should not be used as a sole basis for treatment. Nasal washings and aspirates are unacceptable for Xpert Xpress SARS-CoV-2/FLU/RSV testing.  Fact  Sheet for Patients: [email or web address]  Fact Sheet for Healthcare Providers: SeriousBroker.it  This test is not yet approved or cleared by the [place] FDA and has been authorized for detection and/or diagnosis of SARS-CoV-2 by FDA under an Emergency Use Authorization (EUA). This EUA will remain in effect (meaning this test can be used) for the duration of the COVID-19 declaration under Section 564(b)(1) of the Act, 21 U.S.C. section 360bbb-3(b)(1), unless the authorization is terminated or revoked.  Performed at [facility], [address], [place], [place] [address]   Admission on 05/05/2022, Discharged on 05/05/2022  Component Date Value Ref Range Status   Sodium 05/05/2022 138  135 - 145 mmol/L Final   Potassium 05/05/2022 3.7  3.5 - 5.1 mmol/L Final   Chloride 05/05/2022 105  98 - 111 mmol/L Final   CO2 05/05/2022 19 (L)  22 - 32 mmol/L Final   Glucose, Bld 05/05/2022 91  70 - 99 mg/dL Final   Glucose reference range applies only to samples taken after fasting for at least 8 hours.   BUN 05/05/2022 11  8 - 23 mg/dL Final   Creatinine, Ser 05/05/2022 0.90  0.61 - 1.24 mg/dL Final   Calcium 60/45/4098 8.9  8.9 - 10.3 mg/dL Final   GFR, Estimated 05/05/2022 >60  >60 mL/min Final   Comment: (NOTE) Calculated using the CKD-EPI Creatinine Equation (2021)    Anion gap 05/05/2022 14  5 - 15 Final   Performed at [facility], [address], [place], [place] [address]   WBC 05/05/2022 8.0  4.0 - 10.5 K/uL Final   RBC 05/05/2022 3.78 (L)  4.22 - 5.81 MIL/uL Final   Hemoglobin 05/05/2022 8.4 (L)  13.0 - 17.0 g/dL Final   Comment: Reticulocyte Hemoglobin testing may be clinically indicated, consider ordering this additional test [id number]    HCT 05/05/2022 27.4 (L)  39.0 - 52.0 % Final   MCV 05/05/2022 72.5 (L)  80.0 - 100.0 fL Final   MCH 05/05/2022 22.2 (L)  26.0 - 34.0 pg Final   MCHC 05/05/2022 30.7  30.0 -  36.0 g/dL Final   RDW 30/86/5784 19.8 (H)  11.5 - 15.5 % Final   Platelets 05/05/2022 222  150 - 400 K/uL Final   nRBC 05/05/2022 0.0  0.0 - 0.2 % Final   Performed at [facility], [address] [place], [place] [address]   Troponin I (High Sensitivity) 05/05/2022 10  <18 ng/L Final   Comment: (NOTE) Elevated high sensitivity troponin I (hsTnI) values and significant  changes across serial measurements may suggest ACS but many other  chronic and acute conditions are known to elevate hsTnI results.  Refer to the "Links" section for chest pain algorithms and additional  guidance. Performed at [facility], [address] [place], [place] [address]    Troponin I (High Sensitivity) 05/05/2022 7  <18 ng/L Final   Comment: (NOTE) Elevated high sensitivity troponin I (hsTnI) values and significant  changes across serial measurements may suggest ACS but many other  chronic and acute conditions are known to elevate hsTnI  results.  Refer to the "Links" section for chest pain algorithms and additional  guidance. Performed at [facility], [address], [place], [place] [address]   Admission on 04/20/2022, Discharged on 05/03/2022  Component Date Value Ref Range Status   Sodium 04/20/2022 135  135 - 145 mmol/L Final   Potassium 04/20/2022 4.1  3.5 - 5.1 mmol/L Final   Chloride 04/20/2022 100  98 - 111 mmol/L Final   CO2 04/20/2022 19 (L)  22 - 32 mmol/L Final   Glucose, Bld 04/20/2022 90  70 - 99 mg/dL Final   Glucose reference range applies only to samples taken after fasting for at least 8 hours.   BUN 04/20/2022 10  8 - 23 mg/dL Final   Creatinine, Ser 04/20/2022 1.06  0.61 - 1.24 mg/dL Final   Calcium 40/98/1191 8.8 (L)  8.9 - 10.3 mg/dL Final   Total Protein 47/82/9562 7.3  6.5 - 8.1 g/dL Final   Albumin 13/05/6577 3.4 (L)  3.5 - 5.0 g/dL Final   AST 46/96/2952 74 (H)  15 - 41 U/L Final   ALT 04/20/2022 41  0 - 44 U/L Final   Alkaline Phosphatase 04/20/2022 69   38 - 126 U/L Final   Total Bilirubin 04/20/2022 1.4 (H)  0.3 - 1.2 mg/dL Final   GFR, Estimated 04/20/2022 >60  >60 mL/min Final   Comment: (NOTE) Calculated using the CKD-EPI Creatinine Equation (2021)    Anion gap 04/20/2022 16 (H)  5 - 15 Final   Performed at [facility], [address], [place], [place] [address]   Sodium 04/20/2022 135  135 - 145 mmol/L Final   Potassium 04/20/2022 4.2  3.5 - 5.1 mmol/L Final   Chloride 04/20/2022 103  98 - 111 mmol/L Final   BUN 04/20/2022 10  8 - 23 mg/dL Final   Creatinine, Ser 04/20/2022 1.30 (H)  0.61 - 1.24 mg/dL Final   Glucose, Bld 44/10/270 93  70 - 99 mg/dL Final   Glucose reference range applies only to samples taken after fasting for at least 8 hours.   Calcium, Ion 04/20/2022 0.88 (LL)  1.15 - 1.40 mmol/L Final   TCO2 04/20/2022 21 (L)  22 - 32 mmol/L Final   Hemoglobin 04/20/2022 12.9 (L)  13.0 - 17.0 g/dL Final   HCT 53/66/4403 38.0 (L)  39.0 - 52.0 % Final   Comment 04/20/2022 NOTIFIED PHYSICIAN   Final   WBC 04/20/2022 6.9  4.0 - 10.5 K/uL Final   RBC 04/20/2022 4.91  4.22 - 5.81 MIL/uL Final   Hemoglobin 04/20/2022 11.1 (L)  13.0 - 17.0 g/dL Final   HCT 47/42/5956 38.0 (L)  39.0 - 52.0 % Final   MCV 04/20/2022 77.4 (L)  80.0 - 100.0 fL Final   MCH 04/20/2022 22.6 (L)  26.0 - 34.0 pg Final   MCHC 04/20/2022 29.2 (L)  30.0 - 36.0 g/dL Final   RDW 38/75/6433 21.0 (H)  11.5 - 15.5 % Final   Platelets 04/20/2022 139 (L)  150 - 400 K/uL Final   nRBC 04/20/2022 0.0  0.0 - 0.2 % Final   Performed at [facility], [address], [place], [place] [address]   Alcohol, Ethyl (B) 04/20/2022 262 (H)  <10 mg/dL Final   Comment: (NOTE) Lowest detectable limit for serum alcohol is 10 mg/dL.  For medical purposes only. Performed at [facility], [address], [place], [place] [address]    Color, Urine 04/20/2022 YELLOW  YELLOW Final   APPearance 04/20/2022 CLEAR  CLEAR Final   Specific Gravity, Urine 04/20/2022  1.008  1.005 - 1.030 Final   pH 04/20/2022 7.0  5.0 - 8.0 Final   Glucose, UA 04/20/2022 NEGATIVE  NEGATIVE mg/dL Final   Hgb urine dipstick 04/20/2022 NEGATIVE  NEGATIVE Final   Bilirubin Urine 04/20/2022 NEGATIVE  NEGATIVE Final   Ketones, ur 04/20/2022 NEGATIVE  NEGATIVE mg/dL Final   Protein, ur 09/81/1914 NEGATIVE  NEGATIVE mg/dL Final   Nitrite 78/29/5621 NEGATIVE  NEGATIVE Final   Leukocytes,Ua 04/20/2022 NEGATIVE  NEGATIVE Final   Performed at [facility], [address] [place], [place] [address]   Lactic Acid, Venous 04/20/2022 3.2 (HH)  0.5 - 1.9 mmol/L Final   Comment: CRITICAL RESULT CALLED TO, READ BACK BY AND VERIFIED WITH: M.RUGGIERO,RN @1959  04/20/2022 [email or web address] Performed at [facility], [address] [place], [place] [address]    Prothrombin Time 04/20/2022 14.1  11.4 - 15.2 seconds Final   INR 04/20/2022 1.1  0.8 - 1.2 Final   Comment: (NOTE) INR goal varies based on device and disease states. Performed at [facility], [address], [place], [place] [address]    Blood Bank Specimen 04/20/2022 SAMPLE AVAILABLE FOR TESTING   Final   Sample Expiration 04/20/2022    Final                   Value:04/21/2022,2359 Performed at [facility], [address] [place], [place] [address]    Sodium 04/21/2022 141  135 - 145 mmol/L Final   Potassium 04/21/2022 4.1  3.5 - 5.1 mmol/L Final   Chloride 04/21/2022 102  98 - 111 mmol/L Final   CO2 04/21/2022 25  22 - 32 mmol/L Final   Glucose, Bld 04/21/2022 104 (H)  70 - 99 mg/dL Final   Glucose reference range applies only to samples taken after fasting for at least 8 hours.   BUN 04/21/2022 12  8 - 23 mg/dL Final   Creatinine, Ser 04/21/2022 0.97  0.61 - 1.24 mg/dL Final   Calcium 44/10/270 9.1  8.9 - 10.3 mg/dL Final   GFR, Estimated 04/21/2022 >60  >60 mL/min Final   Comment: (NOTE) Calculated using the CKD-EPI Creatinine Equation (2021)    Anion gap 04/21/2022 14  5 - 15 Final   Performed at  [facility], [address], [place], [place] [address]   WBC 04/21/2022 5.8  4.0 - 10.5 K/uL Final   RBC 04/21/2022 3.94 (L)  4.22 - 5.81 MIL/uL Final   Hemoglobin 04/21/2022 9.1 (L)  13.0 - 17.0 g/dL Final   HCT 40/34/7425 29.3 (L)  39.0 - 52.0 % Final   MCV 04/21/2022 74.4 (L)  80.0 - 100.0 fL Final   MCH 04/21/2022 23.1 (L)  26.0 - 34.0 pg Final   MCHC 04/21/2022 31.1  30.0 - 36.0 g/dL Final   RDW 95/63/8756 20.3 (H)  11.5 - 15.5 % Final   Platelets 04/21/2022 99 (L)  150 - 400 K/uL Final   Comment: Immature Platelet Fraction may be clinically indicated, consider ordering this additional test [id number] REPEATED TO VERIFY PLATELET COUNT CONFIRMED BY SMEAR    nRBC 04/21/2022 0.0  0.0 - 0.2 % Final   Performed at [facility], [address] [place], [place] [address]   HIV Screen 4th Generation wRfx 04/21/2022 Non Reactive  Non Reactive Final   Performed at [facility], [address] [place], [place] [address]   Sodium 04/22/2022 138  135 - 145 mmol/L Final   Potassium 04/22/2022 3.7  3.5 - 5.1 mmol/L Final   Chloride 04/22/2022 103  98 - 111 mmol/L Final   CO2 04/22/2022 26  22 - 32 mmol/L Final   Glucose, Bld 04/22/2022 124 (H)  70 - 99 mg/dL Final   Glucose reference range applies only to samples taken after fasting for at least 8 hours.   BUN 04/22/2022 9  8 - 23 mg/dL Final   Creatinine, Ser 04/22/2022 0.90  0.61 - 1.24 mg/dL Final   Calcium 47/82/9562 8.6 (L)  8.9 - 10.3 mg/dL Final   GFR, Estimated 04/22/2022 >60  >60 mL/min Final   Comment: (NOTE) Calculated using the CKD-EPI Creatinine Equation (2021)    Anion gap 04/22/2022 9  5 - 15 Final   Performed at [facility], [address], [place], [place] [address]   WBC 04/22/2022 3.6 (L)  4.0 - 10.5 K/uL Final   RBC 04/22/2022 3.55 (L)  4.22 - 5.81 MIL/uL Final   Hemoglobin 04/22/2022 8.2 (L)  13.0 - 17.0 g/dL Final   Comment: Reticulocyte Hemoglobin testing may be clinically  indicated, consider ordering this additional test [id number]    HCT 04/22/2022 26.6 (L)  39.0 - 52.0 % Final   MCV 04/22/2022 74.9 (L)  80.0 - 100.0 fL Final   MCH 04/22/2022 23.1 (L)  26.0 - 34.0 pg Final   MCHC 04/22/2022 30.8  30.0 - 36.0 g/dL Final   RDW 95/28/4132 20.5 (H)  11.5 - 15.5 % Final   Platelets 04/22/2022 73 (L)  150 - 400 K/uL Final   Comment: Immature Platelet Fraction may be clinically indicated, consider ordering this additional test [id number] REPEATED TO VERIFY    nRBC 04/22/2022 0.0  0.0 - 0.2 % Final   Performed at [facility], [address] [place], [place] [address]   WBC 04/23/2022 3.7 (L)  4.0 - 10.5 K/uL Final   RBC 04/23/2022 3.61 (L)  4.22 - 5.81 MIL/uL Final   Hemoglobin 04/23/2022 8.4 (L)  13.0 - 17.0 g/dL Final   Comment: Reticulocyte Hemoglobin testing may be clinically indicated, consider ordering this additional test [id number]    HCT 04/23/2022 27.0 (L)  39.0 - 52.0 % Final   MCV 04/23/2022 74.8 (L)  80.0 - 100.0 fL Final   MCH 04/23/2022 23.3 (L)  26.0 - 34.0 pg Final   MCHC 04/23/2022 31.1  30.0 - 36.0 g/dL Final   RDW 95/63/8756 20.9 (H)  11.5 - 15.5 % Final   Platelets 04/23/2022 73 (L)  150 - 400 K/uL Final   Comment: Immature Platelet Fraction may be clinically indicated, consider ordering this additional test [id number] CONSISTENT WITH PREVIOUS RESULT REPEATED TO VERIFY    nRBC 04/23/2022 0.0  0.0 - 0.2 % Final   Performed at [facility], [address], [place], [place] [address]   WBC 04/26/2022 3.8 (L)  4.0 - 10.5 K/uL Final   RBC 04/26/2022 3.34 (L)  4.22 - 5.81 MIL/uL Final   Hemoglobin 04/26/2022 7.7 (L)  13.0 - 17.0 g/dL Final   Comment: Reticulocyte Hemoglobin testing may be clinically indicated, consider ordering this additional test [id number]    HCT 04/26/2022 25.1 (L)  39.0 - 52.0 % Final   MCV 04/26/2022 75.1 (L)  80.0 - 100.0 fL Final   MCH 04/26/2022 23.1 (L)  26.0 - 34.0 pg Final   MCHC 04/26/2022  30.7  30.0 - 36.0 g/dL Final   RDW 16/07/9603 20.3 (H)  11.5 - 15.5 % Final   Platelets 04/26/2022 98 (L)  150 - 400 K/uL Final   REPEATED TO VERIFY   nRBC 04/26/2022 0.0  0.0 - 0.2 % Final   Performed at [facility], [address], [place], [place] [address]   Sodium 04/26/2022 140  135 - 145 mmol/L Final   Potassium 04/26/2022 4.5  3.5 - 5.1 mmol/L Final   Chloride 04/26/2022 105  98 - 111 mmol/L Final   CO2 04/26/2022 28  22 - 32 mmol/L Final   Glucose, Bld 04/26/2022 101 (H)  70 - 99 mg/dL Final   Glucose reference range applies only to samples taken after fasting for at least 8 hours.   BUN 04/26/2022 12  8 - 23 mg/dL Final   Creatinine, Ser 04/26/2022 1.04  0.61 - 1.24 mg/dL Final   Calcium 11/91/4782 8.8 (L)  8.9 - 10.3 mg/dL Final   GFR, Estimated 04/26/2022 >60  >60 mL/min Final   Comment: (NOTE) Calculated using the CKD-EPI Creatinine Equation (2021)    Anion gap 04/26/2022 7  5 - 15 Final   Performed at [facility], [address], [place], [place] [address]   WBC 04/27/2022 3.8 (L)  4.0 - 10.5 K/uL Final   RBC 04/27/2022 3.59 (L)  4.22 - 5.81 MIL/uL Final   Hemoglobin 04/27/2022 8.2 (L)  13.0 - 17.0 g/dL Final   Comment: Reticulocyte Hemoglobin testing may be clinically indicated, consider ordering this additional test [id number]    HCT 04/27/2022 27.1 (L)  39.0 - 52.0 % Final   MCV 04/27/2022 75.5 (L)  80.0 - 100.0 fL Final   MCH 04/27/2022 22.8 (L)  26.0 - 34.0 pg Final   MCHC 04/27/2022 30.3  30.0 - 36.0 g/dL Final   RDW 69/62/9528 20.1 (H)  11.5 - 15.5 % Final   Platelets 04/27/2022 114 (L)  150 - 400 K/uL Final   REPEATED TO VERIFY   nRBC 04/27/2022 0.0  0.0 - 0.2 % Final   Performed at [facility], [address], [place], [place] [address]   Sodium 04/27/2022 139  135 - 145 mmol/L Final   Potassium 04/27/2022 4.2  3.5 - 5.1 mmol/L Final   Chloride 04/27/2022 108  98 - 111 mmol/L Final   CO2 04/27/2022 25  22 - 32 mmol/L Final   Glucose,  Bld 04/27/2022 117 (H)  70 - 99 mg/dL Final   Glucose reference range applies only to samples taken after fasting for at least 8 hours.   BUN 04/27/2022 9  8 - 23 mg/dL Final   Creatinine, Ser 04/27/2022 1.16  0.61 - 1.24 mg/dL Final   Calcium 40/07/2724 8.7 (L)  8.9 - 10.3 mg/dL Final   GFR, Estimated 04/27/2022 >60  >60 mL/min Final   Comment: (NOTE) Calculated using the CKD-EPI Creatinine Equation (2021)    Anion gap 04/27/2022 6  5 - 15 Final   Performed at [facility], [address], [place], [place] [address]   WBC 04/28/2022 4.5  4.0 - 10.5 K/uL Final   RBC 04/28/2022 3.39 (L)  4.22 - 5.81 MIL/uL Final   Hemoglobin 04/28/2022 7.7 (L)  13.0 - 17.0 g/dL Final   Comment: Reticulocyte Hemoglobin testing may be clinically indicated, consider ordering this additional test [id number]    HCT 04/28/2022 25.4 (L)  39.0 - 52.0 % Final   MCV 04/28/2022  74.9 (L)  80.0 - 100.0 fL Final   MCH 04/28/2022 22.7 (L)  26.0 - 34.0 pg Final   MCHC 04/28/2022 30.3  30.0 - 36.0 g/dL Final   RDW 16/07/9603 19.9 (H)  11.5 - 15.5 % Final   Platelets 04/28/2022 125 (L)  150 - 400 K/uL Final   REPEATED TO VERIFY   nRBC 04/28/2022 0.0  0.0 - 0.2 % Final   Performed at [facility], [address], [place], [place] [address]   WBC 04/29/2022 4.3  4.0 - 10.5 K/uL Final   RBC 04/29/2022 3.39 (L)  4.22 - 5.81 MIL/uL Final   Hemoglobin 04/29/2022 7.8 (L)  13.0 - 17.0 g/dL Final   Comment: Reticulocyte Hemoglobin testing may be clinically indicated, consider ordering this additional test [id number]    HCT 04/29/2022 25.1 (L)  39.0 - 52.0 % Final   MCV 04/29/2022 74.0 (L)  80.0 - 100.0 fL Final   MCH 04/29/2022 23.0 (L)  26.0 - 34.0 pg Final   MCHC 04/29/2022 31.1  30.0 - 36.0 g/dL Final   RDW 95/62/1308 19.9 (H)  11.5 - 15.5 % Final   Platelets 04/29/2022 125 (L)  150 - 400 K/uL Final   REPEATED TO VERIFY   nRBC 04/29/2022 0.0  0.0 - 0.2 % Final   Performed at [facility], [address], [place], [place] [address]   WBC 05/01/2022 3.6 (L)  4.0 - 10.5 K/uL Final   RBC 05/01/2022 3.13 (L)  4.22 - 5.81 MIL/uL Final   Hemoglobin 05/01/2022 7.1 (L)  13.0 - 17.0 g/dL Final   Comment: Reticulocyte Hemoglobin testing may be clinically indicated, consider ordering this additional test [id number]    HCT 05/01/2022 23.0 (L)  39.0 - 52.0 % Final   MCV 05/01/2022 73.5 (L)  80.0 - 100.0 fL Final   MCH 05/01/2022 22.7 (L)  26.0 - 34.0 pg Final   MCHC 05/01/2022 30.9  30.0 - 36.0 g/dL Final   RDW 41/32/4401 19.8 (H)  11.5 - 15.5 % Final   Platelets 05/01/2022 149 (L)  150 - 400 K/uL Final   REPEATED TO VERIFY   nRBC 05/01/2022 0.0  0.0 - 0.2 % Final   Performed at [facility], [address], [place], [place] [address]   Sodium 05/01/2022 137  135 - 145 mmol/L Final   Potassium 05/01/2022 4.0  3.5 - 5.1 mmol/L Final   Chloride 05/01/2022 109  98 - 111 mmol/L Final   CO2 05/01/2022 23  22 - 32 mmol/L Final   Glucose, Bld 05/01/2022 98  70 - 99 mg/dL Final   Glucose reference range applies only to samples taken after fasting for at least 8 hours.   BUN 05/01/2022 14  8 - 23 mg/dL Final   Creatinine, Ser 05/01/2022 0.83  0.61 - 1.24 mg/dL Final   Calcium 36/64/4034 8.4 (L)  8.9 - 10.3 mg/dL Final   GFR, Estimated 05/01/2022 >60  >60 mL/min Final   Comment: (NOTE) Calculated using the CKD-EPI Creatinine Equation (2021)    Anion gap 05/01/2022 5  5 - 15 Final   Performed at [facility], [address], [place], [place] [address]   WBC 05/02/2022 7.8  4.0 - 10.5 K/uL Final   RBC 05/02/2022 3.23 (L)  4.22 - 5.81 MIL/uL Final   Hemoglobin 05/02/2022 7.2 (L)  13.0 - 17.0 g/dL Final   Comment: Reticulocyte Hemoglobin testing may be clinically indicated, consider ordering this  additional test [id number]    HCT 05/02/2022 23.6 (L)  39.0 - 52.0 % Final   MCV 05/02/2022 73.1 (L)  80.0 - 100.0 fL Final   MCH 05/02/2022 22.3 (L)  26.0 - 34.0 pg Final   MCHC 05/02/2022  30.5  30.0 - 36.0 g/dL Final   RDW 95/62/1308 19.5 (H)  11.5 - 15.5 % Final   Platelets 05/02/2022 153  150 - 400 K/uL Final   REPEATED TO VERIFY   nRBC 05/02/2022 0.0  0.0 - 0.2 % Final   Performed at [facility], [address] [place], [place] [address]  Admission on 03/31/2022, Discharged on 04/02/2022  Component Date Value Ref Range Status   Sodium 03/31/2022 140  135 - 145 mmol/L Final   Potassium 03/31/2022 3.6  3.5 - 5.1 mmol/L Final   Chloride 03/31/2022 108  98 - 111 mmol/L Final   CO2 03/31/2022 19 (L)  22 - 32 mmol/L Final   Glucose, Bld 03/31/2022 84  70 - 99 mg/dL Final   Glucose reference range applies only to samples taken after fasting for at least 8 hours.   BUN 03/31/2022 9  8 - 23 mg/dL Final   Creatinine, Ser 03/31/2022 0.86  0.61 - 1.24 mg/dL Final   Calcium 69/62/9528 8.6 (L)  8.9 - 10.3 mg/dL Final   Total Protein 41/32/4401 7.2  6.5 - 8.1 g/dL Final   Albumin 02/72/5366 3.5  3.5 - 5.0 g/dL Final   AST 44/12/4740 56 (H)  15 - 41 U/L Final   ALT 03/31/2022 35  0 - 44 U/L Final   Alkaline Phosphatase 03/31/2022 61  38 - 126 U/L Final   Total Bilirubin 03/31/2022 2.0 (H)  0.3 - 1.2 mg/dL Final   GFR, Estimated 03/31/2022 >60  >60 mL/min Final   Comment: (NOTE) Calculated using the CKD-EPI Creatinine Equation (2021)    Anion gap 03/31/2022 13  5 - 15 Final   Performed at [facility], [address] [place], [place] [address]   Alcohol, Ethyl (B) 03/31/2022 203 (H)  <10 mg/dL Final   Comment: (NOTE) Lowest detectable limit for serum alcohol is 10 mg/dL.  For medical purposes only. Performed at [facility], [address] [place], [place] [address]    Salicylate Lvl 03/31/2022 <7.0 (L)  7.0 - 30.0 mg/dL Final   Performed at [facility], [address] [place], [place] [address]   Acetaminophen (Tylenol), Serum 03/31/2022 <10 (L)  10 - 30 ug/mL Final   Comment: (NOTE) Therapeutic concentrations vary significantly. A range of  10-30 ug/mL  may be an effective concentration for many patients. However, some  are best treated at concentrations outside of this range. Acetaminophen concentrations >150 ug/mL at 4 hours after ingestion  and >50 ug/mL at 12 hours after ingestion are often associated with  toxic reactions.  Performed at [facility], [address], [place], [place] [address]    WBC 03/31/2022 4.4  4.0 - 10.5 K/uL Final   RBC 03/31/2022 5.10  4.22 - 5.81 MIL/uL Final   Hemoglobin 03/31/2022 11.8 (L)  13.0 - 17.0 g/dL Final   HCT 66/03/3015 38.5 (L)  39.0 - 52.0 % Final   MCV 03/31/2022 75.5 (L)  80.0 - 100.0 fL Final   MCH 03/31/2022 23.1 (L)  26.0 - 34.0 pg Final   MCHC 03/31/2022 30.6  30.0 - 36.0 g/dL Final   RDW 10/25/3233 19.4 (H)  11.5 - 15.5 %  Final   Platelets 03/31/2022 102 (L)  150 - 400 K/uL Final   Comment: Immature Platelet Fraction may be clinically indicated, consider ordering this additional test [id number] REPEATED TO VERIFY PLATELET COUNT CONFIRMED BY SMEAR    nRBC 03/31/2022 0.0  0.0 - 0.2 % Final   Performed at [facility], [address], [place], [place] [address]   Opiates 03/31/2022 NONE DETECTED  NONE DETECTED Final   Cocaine 03/31/2022 NONE DETECTED  NONE DETECTED Final   Benzodiazepines 03/31/2022 NONE DETECTED  NONE DETECTED Final   Amphetamines 03/31/2022 NONE DETECTED  NONE DETECTED Final   Tetrahydrocannabinol 03/31/2022 NONE DETECTED  NONE DETECTED Final   Barbiturates 03/31/2022 NONE DETECTED  NONE DETECTED Final   Comment: (NOTE) DRUG SCREEN FOR MEDICAL PURPOSES ONLY.  IF CONFIRMATION IS NEEDED FOR ANY PURPOSE, NOTIFY LAB WITHIN 5 DAYS.  LOWEST DETECTABLE LIMITS FOR URINE DRUG SCREEN Drug Class                     Cutoff (ng/mL) Amphetamine and metabolites    1000 Barbiturate and metabolites    200 Benzodiazepine                 200 Tricyclics and metabolites     300 Opiates and metabolites        300 Cocaine and metabolites         300 THC                            50 Performed at [facility], [address], [place], [place] [address]    SARS Coronavirus 2 by RT PCR 03/31/2022 NEGATIVE  NEGATIVE Final   Comment: (NOTE) SARS-CoV-2 target nucleic acids are NOT DETECTED.  The SARS-CoV-2 RNA is generally detectable in upper and lower respiratory specimens during the acute phase of infection. The lowest concentration of SARS-CoV-2 viral copies this assay can detect is 250 copies / mL. A negative result does not preclude SARS-CoV-2 infection and should not be used as the sole basis for treatment or other patient management decisions.  A negative result may occur with improper specimen collection / handling, submission of specimen other than nasopharyngeal swab, presence of viral mutation(s) within the areas targeted by this assay, and inadequate number of viral copies (<250 copies / mL). A negative result must be combined with clinical observations, patient history, and epidemiological information.  Fact Sheet for Patients:   RoadLapTop.co.za  Fact Sheet for Healthcare Providers: http://kim-miller.com/  This test is not yet approved or                           cleared by the [place] FDA and has been authorized for detection and/or diagnosis of SARS-CoV-2 by FDA under an Emergency Use Authorization (EUA).  This EUA will remain in effect (meaning this test can be used) for the duration of the COVID-19 declaration under Section 564(b)(1) of the Act, 21 U.S.C. section 360bbb-3(b)(1), unless the authorization is terminated or revoked sooner.  Performed at [facility], [address] [place], [place] [address]   Admission on 01/22/2022, Discharged on 01/22/2022  Component Date Value Ref Range Status   Lipase 01/22/2022 44  11 - 51 U/L Final   Performed at [facility], [address], [place], [place] [address]   Sodium  01/22/2022 138  135 - 145 mmol/L Final  Potassium 01/22/2022 3.4 (L)  3.5 - 5.1 mmol/L Final   Chloride 01/22/2022 105  98 - 111 mmol/L Final   CO2 01/22/2022 24  22 - 32 mmol/L Final   Glucose, Bld 01/22/2022 90  70 - 99 mg/dL Final   Glucose reference range applies only to samples taken after fasting for at least 8 hours.   BUN 01/22/2022 10  8 - 23 mg/dL Final   Creatinine, Ser 01/22/2022 0.87  0.61 - 1.24 mg/dL Final   Calcium 40/98/1191 8.9  8.9 - 10.3 mg/dL Final   Total Protein 47/82/9562 7.9  6.5 - 8.1 g/dL Final   Albumin 13/05/6577 3.7  3.5 - 5.0 g/dL Final   AST 46/96/2952 43 (H)  15 - 41 U/L Final   ALT 01/22/2022 31  0 - 44 U/L Final   Alkaline Phosphatase 01/22/2022 74  38 - 126 U/L Final   Total Bilirubin 01/22/2022 2.0 (H)  0.3 - 1.2 mg/dL Final   GFR, Estimated 01/22/2022 >60  >60 mL/min Final   Comment: (NOTE) Calculated using the CKD-EPI Creatinine Equation (2021)    Anion gap 01/22/2022 9  5 - 15 Final   Performed at [facility], [address], [place], [place] [address]   WBC 01/22/2022 4.7  4.0 - 10.5 K/uL Final   RBC 01/22/2022 5.84 (H)  4.22 - 5.81 MIL/uL Final   Hemoglobin 01/22/2022 13.1  13.0 - 17.0 g/dL Final   HCT 44/10/270 43.9  39.0 - 52.0 % Final   MCV 01/22/2022 75.2 (L)  80.0 - 100.0 fL Final   MCH 01/22/2022 22.4 (L)  26.0 - 34.0 pg Final   MCHC 01/22/2022 29.8 (L)  30.0 - 36.0 g/dL Final   RDW 53/66/4403 19.7 (H)  11.5 - 15.5 % Final   Platelets 01/22/2022 128 (L)  150 - 400 K/uL Final   nRBC 01/22/2022 0.0  0.0 - 0.2 % Final   Performed at [facility], [address] [place], [place] [address]   Color, Urine 01/22/2022 STRAW (A)  YELLOW Final   APPearance 01/22/2022 CLEAR  CLEAR Final   Specific Gravity, Urine 01/22/2022 1.002 (L)  1.005 - 1.030 Final   pH 01/22/2022 5.0  5.0 - 8.0 Final   Glucose, UA 01/22/2022 NEGATIVE  NEGATIVE mg/dL Final   Hgb urine dipstick 01/22/2022 NEGATIVE  NEGATIVE Final    Bilirubin Urine 01/22/2022 NEGATIVE  NEGATIVE Final   Ketones, ur 01/22/2022 NEGATIVE  NEGATIVE mg/dL Final   Protein, ur 95/63/8756 NEGATIVE  NEGATIVE mg/dL Final   Nitrite 43/32/9518 NEGATIVE  NEGATIVE Final   Leukocytes,Ua 01/22/2022 NEGATIVE  NEGATIVE Final   Performed at [facility], [address] [place], [place] [address]   Opiates 01/22/2022 NONE DETECTED  NONE DETECTED Final   Cocaine 01/22/2022 NONE DETECTED  NONE DETECTED Final   Benzodiazepines 01/22/2022 NONE DETECTED  NONE DETECTED Final   Amphetamines 01/22/2022 NONE DETECTED  NONE DETECTED Final   Tetrahydrocannabinol 01/22/2022 NONE DETECTED  NONE DETECTED Final   Barbiturates 01/22/2022 NONE DETECTED  NONE DETECTED Final   Comment: (NOTE) DRUG SCREEN FOR MEDICAL PURPOSES ONLY.  IF CONFIRMATION IS NEEDED FOR ANY PURPOSE, NOTIFY LAB WITHIN 5 DAYS.  LOWEST DETECTABLE LIMITS FOR URINE DRUG SCREEN Drug Class                     Cutoff (ng/mL) Amphetamine and metabolites    1000 Barbiturate and metabolites    200 Benzodiazepine  200 Tricyclics and metabolites     300 Opiates and metabolites        300 Cocaine and metabolites        300 THC                            50 Performed at [facility], [address] [place], [place] [address]    Alcohol, Ethyl (B) 01/22/2022 134 (H)  <10 mg/dL Final   Comment: (NOTE) Lowest detectable limit for serum alcohol is 10 mg/dL.  For medical purposes only. Performed at [facility], [address], [place], [place] [address]    Specimen Description 01/22/2022    Final                   Value:URINE, CLEAN CATCH Performed at [facility], [address] [place], [place] [address]    Special Requests 01/22/2022    Final                   Value:NONE Performed at [facility], [address], [place], [place] [address]    Culture 01/22/2022    Final                    Value:NO GROWTH Performed at [facility], [address] [place], [place] [address]    Report Status 01/22/2022 01/23/2022 FINAL   Final   Ammonia 01/22/2022 18  9 - 35 umol/L Final   Performed at [facility], [address] [place], [place] [address]   Acetaminophen (Tylenol), Serum 01/22/2022 <10 (L)  10 - 30 ug/mL Final   Comment: (NOTE) Therapeutic concentrations vary significantly. A range of 10-30 ug/mL  may be an effective concentration for many patients. However, some  are best treated at concentrations outside of this range. Acetaminophen concentrations >150 ug/mL at 4 hours after ingestion  and >50 ug/mL at 12 hours after ingestion are often associated with  toxic reactions.  Performed at [facility], [address], [place], [place] [address]    Salicylate Lvl 01/22/2022 <7.0 (L)  7.0 - 30.0 mg/dL Final   Performed at [facility], [address], [place], [place] [address]  Admission on 01/10/2022, Discharged on 01/10/2022  Component Date Value Ref Range Status   Sodium 01/10/2022 131 (L)  135 - 145 mmol/L Final   Potassium 01/10/2022 4.0  3.5 - 5.1 mmol/L Final   Chloride 01/10/2022 98  98 - 111 mmol/L Final   CO2 01/10/2022 23  22 - 32 mmol/L Final   Glucose, Bld 01/10/2022 84  70 - 99 mg/dL Final   Glucose reference range applies only to samples taken after fasting for at least 8 hours.   BUN 01/10/2022 8  8 - 23 mg/dL Final   Creatinine, Ser 01/10/2022 0.76  0.61 - 1.24 mg/dL Final   Calcium 36/64/4034 9.1  8.9 - 10.3 mg/dL Final   Total Protein 74/25/9563 7.8  6.5 - 8.1 g/dL Final   Albumin 87/56/4332 3.8  3.5 - 5.0 g/dL Final   AST 95/18/8416 55 (H)  15 - 41 U/L Final   ALT 01/10/2022 36  0 - 44 U/L Final   Alkaline Phosphatase 01/10/2022 76  38 - 126 U/L Final   Total Bilirubin 01/10/2022 3.3 (H)  0.3 - 1.2 mg/dL Final   GFR, Estimated 01/10/2022 >60  >60  mL/min Final   Comment:  (NOTE) Calculated using the CKD-EPI Creatinine Equation (2021)    Anion gap 01/10/2022 10  5 - 15 Final   Performed at [facility], [address] [place], [place] [address]   WBC 01/10/2022 5.7  4.0 - 10.5 K/uL Final   RBC 01/10/2022 5.16  4.22 - 5.81 MIL/uL Final   Hemoglobin 01/10/2022 11.8 (L)  13.0 - 17.0 g/dL Final   HCT 60/45/4098 39.0  39.0 - 52.0 % Final   MCV 01/10/2022 75.6 (L)  80.0 - 100.0 fL Final   MCH 01/10/2022 22.9 (L)  26.0 - 34.0 pg Final   MCHC 01/10/2022 30.3  30.0 - 36.0 g/dL Final   RDW 11/91/4782 18.4 (H)  11.5 - 15.5 % Final   Platelets 01/10/2022 138 (L)  150 - 400 K/uL Final   REPEATED TO VERIFY   nRBC 01/10/2022 0.0  0.0 - 0.2 % Final   Neutrophils Relative % 01/10/2022 61  % Final   Neutro Abs 01/10/2022 3.5  1.7 - 7.7 K/uL Final   Lymphocytes Relative 01/10/2022 27  % Final   Lymphs Abs 01/10/2022 1.5  0.7 - 4.0 K/uL Final   Monocytes Relative 01/10/2022 10  % Final   Monocytes Absolute 01/10/2022 0.6  0.1 - 1.0 K/uL Final   Eosinophils Relative 01/10/2022 1  % Final   Eosinophils Absolute 01/10/2022 0.0  0.0 - 0.5 K/uL Final   Basophils Relative 01/10/2022 1  % Final   Basophils Absolute 01/10/2022 0.1  0.0 - 0.1 K/uL Final   Immature Granulocytes 01/10/2022 0  % Final   Abs Immature Granulocytes 01/10/2022 0.02  0.00 - 0.07 K/uL Final   Performed at [facility], [address] [place], [place] [address]   Opiates 01/10/2022 NONE DETECTED  NONE DETECTED Final   Cocaine 01/10/2022 NONE DETECTED  NONE DETECTED Final   Benzodiazepines 01/10/2022 NONE DETECTED  NONE DETECTED Final   Amphetamines 01/10/2022 NONE DETECTED  NONE DETECTED Final   Tetrahydrocannabinol 01/10/2022 NONE DETECTED  NONE DETECTED Final   Barbiturates 01/10/2022 NONE DETECTED  NONE DETECTED Final   Comment: (NOTE) DRUG SCREEN FOR MEDICAL PURPOSES ONLY.  IF CONFIRMATION IS NEEDED FOR ANY PURPOSE, NOTIFY LAB WITHIN 5 DAYS.  LOWEST DETECTABLE LIMITS FOR URINE  DRUG SCREEN Drug Class                     Cutoff (ng/mL) Amphetamine and metabolites    1000 Barbiturate and metabolites    200 Benzodiazepine                 200 Tricyclics and metabolites     300 Opiates and metabolites        300 Cocaine and metabolites        300 THC                            50 Performed at [facility], [address] [place], [place] [address]    Color, Urine 01/10/2022 YELLOW  YELLOW Final   APPearance 01/10/2022 CLEAR  CLEAR Final   Specific Gravity, Urine 01/10/2022 1.003 (L)  1.005 - 1.030 Final   pH 01/10/2022 5.0  5.0 - 8.0 Final   Glucose, UA 01/10/2022 NEGATIVE  NEGATIVE mg/dL Final   Hgb urine dipstick 01/10/2022 SMALL (A)  NEGATIVE Final   Bilirubin Urine 01/10/2022 NEGATIVE  NEGATIVE Final   Ketones, ur 01/10/2022  5 (A)  NEGATIVE mg/dL Final   Protein, ur 13/05/6577 NEGATIVE  NEGATIVE mg/dL Final   Nitrite 46/96/2952 NEGATIVE  NEGATIVE Final   Leukocytes,Ua 01/10/2022 NEGATIVE  NEGATIVE Final   WBC, UA 01/10/2022 0-5  0 - 5 WBC/hpf Final   Bacteria, UA 01/10/2022 NONE SEEN  NONE SEEN Final   Squamous Epithelial / LPF 01/10/2022 0-5  0 - 5 Final   Performed at [facility], [address], [place], [place] [address]   Lipase 01/10/2022 37  11 - 51 U/L Final   Performed at [facility], [address], [place], [place] [address]   Total CK 01/10/2022 163  49 - 397 U/L Final   Performed at [facility], [address], [place], [place] [address]  There may be more visits with results that are not included.    Allergies: Carrot [daucus carota]  PTA Medications: (Not in a hospital admission)   Long Term Goals: Improvement in symptoms so as ready for discharge  Short Term Goals: Patient will verbalize feelings in meetings with treatment team members., Patient will attend at least of 50% of the groups daily., Pt will complete the PHQ9 on admission, day 3 and discharge., Patient will participate in completing the [place]  Suicide Severity Rating Scale, Patient will score a low risk of violence for 24 hours prior to discharge, and Patient will take medications as prescribed daily.  Medical Decision Making  [name] was admitted to [facility] base crisis unit under the service of Mariel Craft, MD for Alcohol-induced mood disorder with depressive symptoms [facility]), crisis management, and stabilization. Routine labs ordered, which include  Lab Orders         Resp Panel by RT-PCR (Flu A&B, Covid) Anterior Nasal Swab         CBC with Differential/Platelet         Comprehensive metabolic panel         Magnesium         Ethanol         Prolactin         Urinalysis, Routine w reflex microscopic Urine, Clean Catch         CBG monitoring         POCT Urine Drug Screen - (I-Screen)    Medication Management: Medications started Meds ordered this encounter  Medications   acetaminophen (TYLENOL) tablet 650 mg   alum & mag hydroxide-simeth (MAALOX/MYLANTA) 200-200-20 MG/5ML suspension 30 mL   magnesium hydroxide (MILK OF MAGNESIA) suspension 30 mL   multivitamin with minerals tablet 1 tablet   chlordiazePOXIDE (LIBRIUM) capsule 25 mg   hydrOXYzine (ATARAX) tablet 25 mg   loperamide (IMODIUM) capsule 2-4 mg   ondansetron (ZOFRAN-ODT) disintegrating tablet 4 mg   thiamine (VITAMIN B1) tablet 100 mg   amLODipine (NORVASC) tablet 10 mg   ferrous sulfate tablet 325 mg   gabapentin (NEURONTIN) capsule 300 mg   QUEtiapine (SEROQUEL) tablet 200 mg    Will maintain observation checks every 15 minutes for safety. Psychosocial education regarding relapse prevention and self-care; social and communication  Social work will consult with family for collateral information and discuss discharge and follow up plan.     Recommendations  Based on my evaluation the patient does not appear to have an emergency medical condition.  Jennell Janosik, NP 07/06/22  4:11 PM

## 2022-07-06 NOTE — ED Notes (Signed)
Pt admitted to [facility] for depression and substance use. Pt A&O x4, calm and cooperative. Denies SI/HI/AVH. Pt tolerated skin assessment and admission process well. Pt ambulated independently to unit. Oriented to unit/staff. Snack and juice provided. No signs of distress noted. Monitoring for safety.

## 2022-07-06 NOTE — ED Notes (Signed)
Pending FBC.

## 2022-07-06 NOTE — ED Notes (Signed)
Pt is alert and oriented.  Reports " I dont want to live but I do not have a plan " pt denies AVH, HI.  He was searched with no contraband found.  Skin assessment performed.  Pt was given dinner tray and medications.  Currently resting in no distress.   Staff will monitor for safety.

## 2022-07-06 NOTE — ED Notes (Signed)
Pt asleep in bed. Respirations even and unlabored. Monitoring for safety. 

## 2022-07-07 ENCOUNTER — Encounter (HOSPITAL_COMMUNITY): Payer: Self-pay

## 2022-07-07 DIAGNOSIS — F32A Depression, unspecified: Secondary | ICD-10-CM | POA: Diagnosis not present

## 2022-07-07 LAB — HEPATIC FUNCTION PANEL
ALT: 21 U/L (ref 0–44)
AST: 25 U/L (ref 15–41)
Albumin: 3.4 g/dL — ABNORMAL LOW (ref 3.5–5.0)
Alkaline Phosphatase: 69 U/L (ref 38–126)
Bilirubin, Direct: 0.2 mg/dL (ref 0.0–0.2)
Indirect Bilirubin: 1 mg/dL — ABNORMAL HIGH (ref 0.3–0.9)
Total Bilirubin: 1.2 mg/dL (ref 0.3–1.2)
Total Protein: 8.1 g/dL (ref 6.5–8.1)

## 2022-07-07 LAB — CBC
HCT: 40.8 % (ref 39.0–52.0)
Hemoglobin: 12.1 g/dL — ABNORMAL LOW (ref 13.0–17.0)
MCH: 22.2 pg — ABNORMAL LOW (ref 26.0–34.0)
MCHC: 29.7 g/dL — ABNORMAL LOW (ref 30.0–36.0)
MCV: 74.7 fL — ABNORMAL LOW (ref 80.0–100.0)
Platelets: 66 10*3/uL — ABNORMAL LOW (ref 150–400)
RBC: 5.46 MIL/uL (ref 4.22–5.81)
RDW: 27.9 % — ABNORMAL HIGH (ref 11.5–15.5)
WBC: 8 10*3/uL (ref 4.0–10.5)
nRBC: 0 % (ref 0.0–0.2)

## 2022-07-07 LAB — PROTIME-INR
INR: 1.2 (ref 0.8–1.2)
Prothrombin Time: 15.2 seconds (ref 11.4–15.2)

## 2022-07-07 MED ORDER — ARIPIPRAZOLE 10 MG PO TABS
10.0000 mg | ORAL_TABLET | Freq: Every day | ORAL | Status: DC
  Administered 2022-07-07: 10 mg via ORAL
  Filled 2022-07-07: qty 1

## 2022-07-07 MED ORDER — TRAZODONE HCL 50 MG PO TABS
50.0000 mg | ORAL_TABLET | Freq: Every evening | ORAL | Status: DC | PRN
  Administered 2022-07-07: 50 mg via ORAL
  Filled 2022-07-07: qty 1

## 2022-07-07 NOTE — ED Notes (Signed)
Pt is in the bed sleeping. Respirations are even and unlabored. No acute distress noted. Will continue to monitor for safety. 

## 2022-07-07 NOTE — ED Provider Notes (Addendum)
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 Negative.  Fact Sheet for Patients: https://www.jennings-kim.com/  Fact Sheet for Healthcare Providers: https://alexander-rogers.biz/  This test is not yet approved or cleared by the [place] FDA and  has been authorized for detection and/or diagnosis of SARS-CoV-2 by FDA under an Emergency Use Authorization (EUA).  This EUA will remain in effect (meaning this test can  be used) for the duration of  the COV                          ID-19 declaration under Section 564(b)(1) of the Act, 21 U.S.C. section 360bbb-3(b)(1), unless the authorization is terminated or revoked sooner.    Admission on 05/29/2022, Discharged on 06/01/2022  Component Date Value Ref Range Status   SARS Coronavirus  2 by RT PCR 05/29/2022 NEGATIVE  NEGATIVE Final   Comment: (NOTE) SARS-CoV-2 target nucleic acids are NOT DETECTED.  The SARS-CoV-2 RNA is generally detectable in upper respiratory specimens during the acute phase of infection. The lowest concentration of SARS-CoV-2 viral copies this assay can detect is 138 copies/mL. A negative result does not preclude SARS-Cov-2 infection and should not be used as the sole basis for treatment or other patient management decisions. A negative result may occur with  improper specimen collection/handling, submission of specimen other than nasopharyngeal swab, presence of viral mutation(s) within the areas targeted by this assay, and inadequate number of viral copies(<138 copies/mL). A negative result must be combined with clinical observations, patient history, and epidemiological information. The expected result is Negative.  Fact Sheet for Patients:  BloggerCourse.com  Fact Sheet for Healthcare Providers:  SeriousBroker.it  This test is no                          t yet approved or cleared by the [place] FDA and  has been authorized for detection and/or diagnosis of SARS-CoV-2 by FDA under an Emergency Use Authorization (EUA). This EUA will remain  in effect (meaning this test can be used) for the duration of the COVID-19 declaration under Section 564(b)(1) of the Act, 21 U.S.C.section 360bbb-3(b)(1), unless the authorization is terminated  or revoked sooner.       Influenza A by PCR 05/29/2022 NEGATIVE  NEGATIVE Final   Influenza B by PCR 05/29/2022 NEGATIVE  NEGATIVE  Final   Comment: (NOTE) The Xpert Xpress SARS-CoV-2/FLU/RSV plus assay is intended as an aid in the diagnosis of influenza from Nasopharyngeal swab specimens and should not be used as a sole basis for treatment. Nasal washings and aspirates are unacceptable for Xpert Xpress SARS-CoV-2/FLU/RSV testing.  Fact Sheet for Patients: BloggerCourse.com  Fact Sheet for Healthcare Providers: SeriousBroker.it  This test is not yet approved or cleared by the [place] FDA and has been authorized for detection and/or diagnosis of SARS-CoV-2 by FDA under an Emergency Use Authorization (EUA). This EUA will remain in effect (meaning this test can be used) for the duration of the COVID-19 declaration under Section 564(b)(1) of the Act, 21 U.S.C. section 360bbb-3(b)(1), unless the authorization is terminated or revoked.  Performed at [facility], [address] [place], [place] [address]    WBC 05/29/2022 7.2  4.0 - 10.5 K/uL Final   RBC 05/29/2022 4.35  4.22 - 5.81 MIL/uL Final   Hemoglobin 05/29/2022 9.0 (L)  13.0 - 17.0 g/dL Final   HCT 60/45/4098 29.7 (L)  39.0 - 52.0 % Final   MCV 05/29/2022 68.3 (L)  80.0 - 100.0 fL Final   MCH 05/29/2022 20.7 (L)  26.0 - 34.0 pg Final   MCHC 05/29/2022 30.3  30.0 - 36.0 g/dL Final   RDW 11/91/4782 19.0 (H)  11.5 - 15.5 % Final   Platelets 05/29/2022 148 (L)  150 - 400 K/uL Final   REPEATED TO VERIFY   nRBC 05/29/2022 0.0  0.0 - 0.2 % Final   Neutrophils Relative % 05/29/2022 53  % Final   Neutro Abs 05/29/2022 3.8  1.7 - 7.7 K/uL Final   Lymphocytes Relative 05/29/2022 33  % Final   Lymphs Abs 05/29/2022 2.3  0.7 - 4.0 K/uL Final   Monocytes Relative 05/29/2022 9  % Final   Monocytes Absolute 05/29/2022 0.6  0.1 - 1.0 K/uL Final   Eosinophils Relative 05/29/2022  4  % Final   Eosinophils Absolute 05/29/2022 0.3  0.0 - 0.5 K/uL Final   Basophils Relative 05/29/2022 1  % Final   Basophils  Absolute 05/29/2022 0.1  0.0 - 0.1 K/uL Final   Immature Granulocytes 05/29/2022 0  % Final   Abs Immature Granulocytes 05/29/2022 0.02  0.00 - 0.07 K/uL Final   Performed at [facility], [address], [place], [place] [address]   Sodium 05/29/2022 134 (L)  135 - 145 mmol/L Final   Potassium 05/29/2022 3.8  3.5 - 5.1 mmol/L Final   Chloride 05/29/2022 105  98 - 111 mmol/L Final   CO2 05/29/2022 19 (L)  22 - 32 mmol/L Final   Glucose, Bld 05/29/2022 84  70 - 99 mg/dL Final   Glucose reference range applies only to samples taken after fasting for at least 8 hours.   BUN 05/29/2022 8  8 - 23 mg/dL Final   Creatinine, Ser 05/29/2022 0.87  0.61 - 1.24 mg/dL Final   Calcium 60/45/4098 8.7 (L)  8.9 - 10.3 mg/dL Final   Total Protein 11/91/4782 7.8  6.5 - 8.1 g/dL Final   Albumin 95/62/1308 3.8  3.5 - 5.0 g/dL Final   AST 65/78/4696 30  15 - 41 U/L Final   ALT 05/29/2022 23  0 - 44 U/L Final   Alkaline Phosphatase 05/29/2022 65  38 - 126 U/L Final   Total Bilirubin 05/29/2022 1.3 (H)  0.3 - 1.2 mg/dL Final   GFR, Estimated 05/29/2022 >60  >60 mL/min Final   Comment: (NOTE) Calculated using the CKD-EPI Creatinine Equation (2021)    Anion gap 05/29/2022 10  5 - 15 Final   Performed at [facility], [address] [place], [place] [address]   Alcohol, Ethyl (B) 05/29/2022 194 (H)  <10 mg/dL Final   Comment: (NOTE) Lowest detectable limit for serum alcohol is 10 mg/dL.  For medical purposes only. Performed at [facility], [address], [place], [place] [address]    POC Amphetamine UR 05/29/2022 None Detected  NONE DETECTED (Cut Off Level 1000 ng/mL) Final   POC Secobarbital (BAR) 05/29/2022 None Detected  NONE DETECTED (Cut Off Level 300 ng/mL) Final   POC Buprenorphine (BUP) 05/29/2022 None Detected  NONE DETECTED (Cut Off Level 10 ng/mL) Final   POC Oxazepam (BZO) 05/29/2022 None Detected  NONE DETECTED (Cut Off Level 300 ng/mL) Final   POC Cocaine UR 05/29/2022  None Detected  NONE DETECTED (Cut Off Level 300 ng/mL) Final   POC Methamphetamine UR 05/29/2022 None Detected  NONE DETECTED (Cut Off Level 1000 ng/mL) Final   POC Morphine 05/29/2022 None Detected  NONE DETECTED (Cut Off Level 300 ng/mL) Final   POC Methadone UR 05/29/2022 None Detected  NONE DETECTED (Cut Off Level 300 ng/mL) Final   POC Oxycodone UR 05/29/2022 None Detected  NONE DETECTED (Cut Off Level 100 ng/mL) Final   POC Marijuana UR 05/29/2022 None Detected  NONE DETECTED (Cut Off Level 50 ng/mL) Final   SARSCOV2ONAVIRUS 2 AG 05/29/2022 NEGATIVE  NEGATIVE Final   Comment: (NOTE) SARS-CoV-2 antigen NOT DETECTED.   Negative results are presumptive.  Negative results do not preclude SARS-CoV-2 infection and should not be used as the sole basis for treatment or other patient management decisions, including infection  control decisions, particularly in the presence of clinical signs and  symptoms consistent with COVID-19, or in those who have been in contact with the virus.  Negative results must be combined  with clinical observations, patient history, and epidemiological information. The expected result is Negative.  Fact Sheet for Patients: https://www.jennings-kim.com/  Fact Sheet for Healthcare Providers: https://alexander-rogers.biz/  This test is not yet approved or cleared by the [place] FDA and  has been authorized for detection and/or diagnosis of SARS-CoV-2 by FDA under an Emergency Use Authorization (EUA).  This EUA will remain in effect (meaning this test can be used) for the duration of  the COV                          ID-19 declaration under Section 564(b)(1) of the Act, 21 U.S.C. section 360bbb-3(b)(1), unless the authorization is terminated or revoked sooner.     Iron 05/31/2022 21 (L)  45 - 182 ug/dL Final   TIBC 16/07/9603 553 (H)  250 - 450 ug/dL Final   Saturation Ratios 05/31/2022 4 (L)  17.9 - 39.5 % Final   UIBC  05/31/2022 532  ug/dL Final   Performed at [facility], [address], [place], [place] [address]   T3, Free 05/31/2022 2.5  2.0 - 4.4 pg/mL Final   Comment: (NOTE) Performed At: [facility] [address] [place], [place] [id number] Jolene Schimke MD FA:[id number]    Free T4 05/31/2022 0.81  0.61 - 1.12 ng/dL Final   Comment: (NOTE) Biotin ingestion may interfere with free T4 tests. If the results are inconsistent with the TSH level, previous test results, or the clinical presentation, then consider biotin interference. If needed, order repeat testing after stopping biotin. Performed at [facility], [address] [place], [place] [address]    Cholesterol 05/31/2022 140  0 - 200 mg/dL Final   Triglycerides 52/84/1324 91  <150 mg/dL Final   HDL 40/07/2724 54  >40 mg/dL Final   Total CHOL/HDL Ratio 05/31/2022 2.6  RATIO Final   VLDL 05/31/2022 18  0 - 40 mg/dL Final   LDL Cholesterol 05/31/2022 68  0 - 99 mg/dL Final   Comment:        Total Cholesterol/HDL:CHD Risk Coronary Heart Disease Risk Table                     Men   Women  1/2 Average Risk   3.4   3.3  Average Risk       5.0   4.4  2 X Average Risk   9.6   7.1  3 X Average Risk  23.4   11.0        Use the calculated Patient Ratio above and the CHD Risk Table to determine the patient's CHD Risk.        ATP III CLASSIFICATION (LDL):  <100     mg/dL   Optimal  366-440  mg/dL   Near or Above                    Optimal  130-159  mg/dL   Borderline  347-425  mg/dL   High  >956     mg/dL   Very High Performed at [facility], [address] [place], [place] [address]   Admission on 05/24/2022, Discharged on 05/25/2022  Component Date Value Ref Range Status   SARS Coronavirus 2 by RT PCR 05/24/2022 NEGATIVE  NEGATIVE Final   Comment: (NOTE) SARS-CoV-2 target nucleic acids are NOT DETECTED.  The SARS-CoV-2 RNA is generally detectable in upper respiratory specimens during the acute  phase  of infection. The lowest concentration of SARS-CoV-2 viral copies this assay can detect is 138 copies/mL. A negative result does not preclude SARS-Cov-2 infection and should not be used as the sole basis for treatment or other patient management decisions. A negative result may occur with  improper specimen collection/handling, submission of specimen other than nasopharyngeal swab, presence of viral mutation(s) within the areas targeted by this assay, and inadequate number of viral copies(<138 copies/mL). A negative result must be combined with clinical observations, patient history, and epidemiological information. The expected result is Negative.  Fact Sheet for Patients:  BloggerCourse.com  Fact Sheet for Healthcare Providers:  SeriousBroker.it  This test is no                          t yet approved or cleared by the [place] FDA and  has been authorized for detection and/or diagnosis of SARS-CoV-2 by FDA under an Emergency Use Authorization (EUA). This EUA will remain  in effect (meaning this test can be used) for the duration of the COVID-19 declaration under Section 564(b)(1) of the Act, 21 U.S.C.section 360bbb-3(b)(1), unless the authorization is terminated  or revoked sooner.       Influenza A by PCR 05/24/2022 NEGATIVE  NEGATIVE Final   Influenza B by PCR 05/24/2022 NEGATIVE  NEGATIVE Final   Comment: (NOTE) The Xpert Xpress SARS-CoV-2/FLU/RSV plus assay is intended as an aid in the diagnosis of influenza from Nasopharyngeal swab specimens and should not be used as a sole basis for treatment. Nasal washings and aspirates are unacceptable for Xpert Xpress SARS-CoV-2/FLU/RSV testing.  Fact Sheet for Patients: BloggerCourse.com  Fact Sheet for Healthcare Providers: SeriousBroker.it  This test is not yet approved or cleared by the [place] FDA and has  been authorized for detection and/or diagnosis of SARS-CoV-2 by FDA under an Emergency Use Authorization (EUA). This EUA will remain in effect (meaning this test can be used) for the duration of the COVID-19 declaration under Section 564(b)(1) of the Act, 21 U.S.C. section 360bbb-3(b)(1), unless the authorization is terminated or revoked.  Performed at [facility], [address], [place], [place] [address]    WBC 05/24/2022 5.3  4.0 - 10.5 K/uL Final   RBC 05/24/2022 3.98 (L)  4.22 - 5.81 MIL/uL Final   Hemoglobin 05/24/2022 8.4 (L)  13.0 - 17.0 g/dL Final   Comment: Reticulocyte Hemoglobin testing may be clinically indicated, consider ordering this additional test [id number]    HCT 05/24/2022 27.4 (L)  39.0 - 52.0 % Final   MCV 05/24/2022 68.8 (L)  80.0 - 100.0 fL Final   MCH 05/24/2022 21.1 (L)  26.0 - 34.0 pg Final   MCHC 05/24/2022 30.7  30.0 - 36.0 g/dL Final   RDW 11/91/4782 19.3 (H)  11.5 - 15.5 % Final   Platelets 05/24/2022 149 (L)  150 - 400 K/uL Final   REPEATED TO VERIFY   nRBC 05/24/2022 0.0  0.0 - 0.2 % Final   Neutrophils Relative % 05/24/2022 39  % Final   Neutro Abs 05/24/2022 2.0  1.7 - 7.7 K/uL Final   Lymphocytes Relative 05/24/2022 45  % Final   Lymphs Abs 05/24/2022 2.4  0.7 - 4.0 K/uL Final   Monocytes Relative 05/24/2022 11  % Final   Monocytes Absolute 05/24/2022 0.6  0.1 - 1.0 K/uL Final   Eosinophils Relative 05/24/2022 3  % Final   Eosinophils Absolute 05/24/2022 0.2  0.0 - 0.5 K/uL Final  Basophils Relative 05/24/2022 2  % Final   Basophils Absolute 05/24/2022 0.1  0.0 - 0.1 K/uL Final   Immature Granulocytes 05/24/2022 0  % Final   Abs Immature Granulocytes 05/24/2022 0.01  0.00 - 0.07 K/uL Final   Performed at [facility], [address] [place], [place] [address]   Sodium 05/24/2022 139  135 - 145 mmol/L Final   Potassium 05/24/2022 3.9  3.5 - 5.1 mmol/L Final   Chloride 05/24/2022 108  98 - 111 mmol/L Final   CO2 05/24/2022 21  (L)  22 - 32 mmol/L Final   Glucose, Bld 05/24/2022 100 (H)  70 - 99 mg/dL Final   Glucose reference range applies only to samples taken after fasting for at least 8 hours.   BUN 05/24/2022 12  8 - 23 mg/dL Final   Creatinine, Ser 05/24/2022 1.04  0.61 - 1.24 mg/dL Final   Calcium 60/45/4098 9.0  8.9 - 10.3 mg/dL Final   Total Protein 11/91/4782 7.4  6.5 - 8.1 g/dL Final   Albumin 95/62/1308 3.6  3.5 - 5.0 g/dL Final   AST 65/78/4696 36  15 - 41 U/L Final   ALT 05/24/2022 26  0 - 44 U/L Final   Alkaline Phosphatase 05/24/2022 77  38 - 126 U/L Final   Total Bilirubin 05/24/2022 1.2  0.3 - 1.2 mg/dL Final   GFR, Estimated 05/24/2022 >60  >60 mL/min Final   Comment: (NOTE) Calculated using the CKD-EPI Creatinine Equation (2021)    Anion gap 05/24/2022 10  5 - 15 Final   Performed at [facility], [address], [place], [place] [address]   Hgb A1c MFr Bld 05/24/2022 5.2  4.8 - 5.6 % Final   Comment: (NOTE) Pre diabetes:          5.7%-6.4%  Diabetes:              >6.4%  Glycemic control for   <7.0% adults with diabetes    Mean Plasma Glucose 05/24/2022 102.54  mg/dL Final   Performed at [facility], [address], [place], [place] [address]   Alcohol, Ethyl (B) 05/24/2022 99 (H)  <10 mg/dL Final   Comment: (NOTE) Lowest detectable limit for serum alcohol is 10 mg/dL.  For medical purposes only. Performed at [facility], [address] [place], [place] [address]    Hepatitis B Surface Ag 05/24/2022 NON REACTIVE  NON REACTIVE Final   HCV Ab 05/24/2022 Reactive (A)  NON REACTIVE Final   Comment: (NOTE) The CDC recommends that a Reactive HCV antibody result be followed up  with a HCV Nucleic Acid Amplification test.     Hep A IgM 05/24/2022 NON REACTIVE  NON REACTIVE Final   Hep B C IgM 05/24/2022 NON REACTIVE  NON REACTIVE Final   Performed at [facility], [address], [place], [place] [address]   POC Amphetamine UR 05/25/2022 None Detected   NONE DETECTED (Cut Off Level 1000 ng/mL) Final   POC Secobarbital (BAR) 05/25/2022 None Detected  NONE DETECTED (Cut Off Level 300 ng/mL) Final   POC Buprenorphine (BUP) 05/25/2022 None Detected  NONE DETECTED (Cut Off Level 10 ng/mL) Final   POC Oxazepam (BZO) 05/25/2022 Positive (A)  NONE DETECTED (Cut Off Level 300 ng/mL) Final   POC Cocaine UR 05/25/2022 Positive (A)  NONE DETECTED (Cut Off Level 300 ng/mL) Final   POC Methamphetamine UR 05/25/2022 None Detected  NONE DETECTED (Cut Off Level 1000  ng/mL) Final   POC Morphine 05/25/2022 None Detected  NONE DETECTED (Cut Off Level 300 ng/mL) Final   POC Methadone UR 05/25/2022 None Detected  NONE DETECTED (Cut Off Level 300 ng/mL) Final   POC Oxycodone UR 05/25/2022 None Detected  NONE DETECTED (Cut Off Level 100 ng/mL) Final   POC Marijuana UR 05/25/2022 None Detected  NONE DETECTED (Cut Off Level 50 ng/mL) Final   SARSCOV2ONAVIRUS 2 AG 05/24/2022 NEGATIVE  NEGATIVE Final   Comment: (NOTE) SARS-CoV-2 antigen NOT DETECTED.   Negative results are presumptive.  Negative results do not preclude SARS-CoV-2 infection and should not be used as the sole basis for treatment or other patient management decisions, including infection  control decisions, particularly in the presence of clinical signs and  symptoms consistent with COVID-19, or in those who have been in contact with the virus.  Negative results must be combined with clinical observations, patient history, and epidemiological information. The expected result is Negative.  Fact Sheet for Patients: https://www.jennings-kim.com/  Fact Sheet for Healthcare Providers: https://alexander-rogers.biz/  This test is not yet approved or cleared by the [place] FDA and  has been authorized for detection and/or diagnosis of SARS-CoV-2 by FDA under an Emergency Use Authorization (EUA).  This EUA will remain in effect (meaning this test can be used) for the duration  of  the COV                          ID-19 declaration under Section 564(b)(1) of the Act, 21 U.S.C. section 360bbb-3(b)(1), unless the authorization is terminated or revoked sooner.     Cholesterol 05/24/2022 141  0 - 200 mg/dL Final   Triglycerides 04/54/0981 94  <150 mg/dL Final   HDL 19/14/7829 57  >40 mg/dL Final   Total CHOL/HDL Ratio 05/24/2022 2.5  RATIO Final   VLDL 05/24/2022 19  0 - 40 mg/dL Final   LDL Cholesterol 05/24/2022 NOT CALCULATED  0 - 99 mg/dL Final   Performed at [facility], [address] [place], [place] [address]   TSH 05/24/2022 2.834  0.350 - 4.500 uIU/mL Final   Comment: Performed by a 3rd Generation assay with a functional sensitivity of <=0.01 uIU/mL. Performed at [facility], [address] [place], [place] [address]   Admission on 05/10/2022, Discharged on 05/11/2022  Component Date Value Ref Range Status   SARS Coronavirus 2 by RT PCR 05/10/2022 NEGATIVE  NEGATIVE Final   Comment: (NOTE) SARS-CoV-2 target nucleic acids are NOT DETECTED.  The SARS-CoV-2 RNA is generally detectable in upper respiratory specimens during the acute phase of infection. The lowest concentration of SARS-CoV-2 viral copies this assay can detect is 138 copies/mL. A negative result does not preclude SARS-Cov-2 infection and should not be used as the sole basis for treatment or other patient management decisions. A negative result may occur with  improper specimen collection/handling, submission of specimen other than nasopharyngeal swab, presence of viral mutation(s) within the areas targeted by this assay, and inadequate number of viral copies(<138 copies/mL). A negative result must be combined with clinical observations, patient history, and epidemiological information. The expected result is Negative.  Fact Sheet for Patients:  BloggerCourse.com  Fact Sheet for Healthcare Providers:   SeriousBroker.it  This test is no                          t yet approved or cleared by the [place]  FDA and  has been authorized for detection and/or diagnosis of SARS-CoV-2 by FDA under an Emergency Use Authorization (EUA). This EUA will remain  in effect (meaning this test can be used) for the duration of the COVID-19 declaration under Section 564(b)(1) of the Act, 21 U.S.C.section 360bbb-3(b)(1), unless the authorization is terminated  or revoked sooner.       Influenza A by PCR 05/10/2022 NEGATIVE  NEGATIVE Final   Influenza B by PCR 05/10/2022 NEGATIVE  NEGATIVE Final   Comment: (NOTE) The Xpert Xpress SARS-CoV-2/FLU/RSV plus assay is intended as an aid in the diagnosis of influenza from Nasopharyngeal swab specimens and should not be used as a sole basis for treatment. Nasal washings and aspirates are unacceptable for Xpert Xpress SARS-CoV-2/FLU/RSV testing.  Fact Sheet for Patients: BloggerCourse.com  Fact Sheet for Healthcare Providers: SeriousBroker.it  This test is not yet approved or cleared by the [place] FDA and has been authorized for detection and/or diagnosis of SARS-CoV-2 by FDA under an Emergency Use Authorization (EUA). This EUA will remain in effect (meaning this test can be used) for the duration of the COVID-19 declaration under Section 564(b)(1) of the Act, 21 U.S.C. section 360bbb-3(b)(1), unless the authorization is terminated or revoked.  Performed at [facility], [address], [place], [place] [address]    Sodium 05/10/2022 137  135 - 145 mmol/L Final   Potassium 05/10/2022 3.7  3.5 - 5.1 mmol/L Final   Chloride 05/10/2022 106  98 - 111 mmol/L Final   CO2 05/10/2022 23  22 - 32 mmol/L Final   Glucose, Bld 05/10/2022 90  70 - 99 mg/dL Final   Glucose reference range applies only to samples taken after fasting for at least 8 hours.   BUN  05/10/2022 13  8 - 23 mg/dL Final   Creatinine, Ser 05/10/2022 1.04  0.61 - 1.24 mg/dL Final   Calcium 40/98/1191 8.8 (L)  8.9 - 10.3 mg/dL Final   Total Protein 47/82/9562 7.8  6.5 - 8.1 g/dL Final   Albumin 13/05/6577 3.6  3.5 - 5.0 g/dL Final   AST 46/96/2952 30  15 - 41 U/L Final   ALT 05/10/2022 21  0 - 44 U/L Final   Alkaline Phosphatase 05/10/2022 62  38 - 126 U/L Final   Total Bilirubin 05/10/2022 0.8  0.3 - 1.2 mg/dL Final   GFR, Estimated 05/10/2022 >60  >60 mL/min Final   Comment: (NOTE) Calculated using the CKD-EPI Creatinine Equation (2021)    Anion gap 05/10/2022 8  5 - 15 Final   Performed at [facility], [address], [place], [place] [address]   Alcohol, Ethyl (B) 05/10/2022 156 (H)  <10 mg/dL Final   Comment: (NOTE) Lowest detectable limit for serum alcohol is 10 mg/dL.  For medical purposes only. Performed at [facility], [address] [place], [place] [address]    Opiates 05/10/2022 POSITIVE (A)  NONE DETECTED Final   Cocaine 05/10/2022 POSITIVE (A)  NONE DETECTED Final   Benzodiazepines 05/10/2022 POSITIVE (A)  NONE DETECTED Final   Amphetamines 05/10/2022 NONE DETECTED  NONE DETECTED Final   Tetrahydrocannabinol 05/10/2022 NONE DETECTED  NONE DETECTED Final   Barbiturates 05/10/2022 NONE DETECTED  NONE DETECTED Final   Comment: (NOTE) DRUG SCREEN FOR MEDICAL PURPOSES ONLY.  IF CONFIRMATION IS NEEDED FOR ANY PURPOSE, NOTIFY LAB WITHIN 5 DAYS.  LOWEST DETECTABLE LIMITS FOR URINE DRUG SCREEN Drug Class  Cutoff (ng/mL) Amphetamine and metabolites    1000 Barbiturate and metabolites    200 Benzodiazepine                 200 Tricyclics and metabolites     300 Opiates and metabolites        300 Cocaine and metabolites        300 THC                            50 Performed at [facility], [address], [place], [place] [address]    WBC 05/10/2022 4.5  4.0 - 10.5 K/uL  Final   RBC 05/10/2022 3.77 (L)  4.22 - 5.81 MIL/uL Final   Hemoglobin 05/10/2022 8.3 (L)  13.0 - 17.0 g/dL Final   Comment: Reticulocyte Hemoglobin testing may be clinically indicated, consider ordering this additional test [id number]    HCT 05/10/2022 27.3 (L)  39.0 - 52.0 % Final   MCV 05/10/2022 72.4 (L)  80.0 - 100.0 fL Final   MCH 05/10/2022 22.0 (L)  26.0 - 34.0 pg Final   MCHC 05/10/2022 30.4  30.0 - 36.0 g/dL Final   RDW 47/82/9562 19.8 (H)  11.5 - 15.5 % Final   Platelets 05/10/2022 162  150 - 400 K/uL Final   nRBC 05/10/2022 0.0  0.0 - 0.2 % Final   Neutrophils Relative % 05/10/2022 55  % Final   Neutro Abs 05/10/2022 2.5  1.7 - 7.7 K/uL Final   Lymphocytes Relative 05/10/2022 32  % Final   Lymphs Abs 05/10/2022 1.4  0.7 - 4.0 K/uL Final   Monocytes Relative 05/10/2022 10  % Final   Monocytes Absolute 05/10/2022 0.4  0.1 - 1.0 K/uL Final   Eosinophils Relative 05/10/2022 2  % Final   Eosinophils Absolute 05/10/2022 0.1  0.0 - 0.5 K/uL Final   Basophils Relative 05/10/2022 1  % Final   Basophils Absolute 05/10/2022 0.1  0.0 - 0.1 K/uL Final   Immature Granulocytes 05/10/2022 0  % Final   Abs Immature Granulocytes 05/10/2022 0.01  0.00 - 0.07 K/uL Final   Performed at [facility], [address] [place], [place] [address]   Salicylate Lvl 05/10/2022 <7.0 (L)  7.0 - 30.0 mg/dL Final   Performed at [facility], [address], [place], [place] [address]   Acetaminophen (Tylenol), Serum 05/10/2022 <10 (L)  10 - 30 ug/mL Final   Comment: (NOTE) Therapeutic concentrations vary significantly. A range of 10-30 ug/mL  may be an effective concentration for many patients. However, some  are best treated at concentrations outside of this range. Acetaminophen concentrations >150 ug/mL at 4 hours after ingestion  and >50 ug/mL at 12 hours after ingestion are often associated with  toxic reactions.  Performed at [facility],  [address], [place], [place] [address]    Magnesium 05/10/2022 2.0  1.7 - 2.4 mg/dL Final   Performed at [facility], [address] [place], [place] [address]   Color, Urine 05/10/2022 STRAW (A)  YELLOW Final   APPearance 05/10/2022 CLEAR  CLEAR Final   Specific Gravity, Urine 05/10/2022 1.005  1.005 - 1.030 Final   pH 05/10/2022 5.0  5.0 - 8.0 Final   Glucose, UA 05/10/2022 NEGATIVE  NEGATIVE mg/dL Final   Hgb urine dipstick 05/10/2022 NEGATIVE  NEGATIVE Final   Bilirubin Urine 05/10/2022 NEGATIVE  NEGATIVE Final   Ketones, ur  05/10/2022 NEGATIVE  NEGATIVE mg/dL Final   Protein, ur 05/10/2022 NEGATIVE  NEGATIVE mg/dL Final   Nitrite 05/10/2022 NEGATIVE  NEGATIVE Final   Leukocytes,Ua 05/10/2022 NEGATIVE  NEGATIVE Final   Performed at [place] [address], [place], [place] [address]  Admission on 05/05/2022, Discharged on 05/06/2022  Component Date Value Ref Range Status   WBC 05/05/2022 7.3  4.0 - 10.5 K/uL Final   RBC 05/05/2022 3.68 (L)  4.22 - 5.81 MIL/uL Final   Hemoglobin 05/05/2022 8.2 (L)  13.0 - 17.0 g/dL Final   Comment: Reticulocyte Hemoglobin testing may be clinically indicated, consider ordering this additional test [id number]    HCT 05/05/2022 27.1 (L)  39.0 - 52.0 % Final   MCV 05/05/2022 73.6 (L)  80.0 - 100.0 fL Final   MCH 05/05/2022 22.3 (L)  26.0 - 34.0 pg Final   MCHC 05/05/2022 30.3  30.0 - 36.0 g/dL Final   RDW 05/05/2022 19.6 (H)  11.5 - 15.5 % Final   Platelets 05/05/2022 234  150 - 400 K/uL Final   nRBC 05/05/2022 0.0  0.0 - 0.2 % Final   Neutrophils Relative % 05/05/2022 44  % Final   Neutro Abs 05/05/2022 3.2  1.7 - 7.7 K/uL Final   Lymphocytes Relative 05/05/2022 38  % Final   Lymphs Abs 05/05/2022 2.8  0.7 - 4.0 K/uL Final   Monocytes Relative 05/05/2022 13  % Final   Monocytes Absolute 05/05/2022 0.9  0.1 - 1.0 K/uL Final   Eosinophils Relative 05/05/2022 4  % Final   Eosinophils Absolute  05/05/2022 0.3  0.0 - 0.5 K/uL Final   Basophils Relative 05/05/2022 1  % Final   Basophils Absolute 05/05/2022 0.1  0.0 - 0.1 K/uL Final   Immature Granulocytes 05/05/2022 0  % Final   Abs Immature Granulocytes 05/05/2022 0.02  0.00 - 0.07 K/uL Final   Performed at [facility], [place] [address] [place], [place] [address]   Sodium 05/05/2022 138  135 - 145 mmol/L Final   Potassium 05/05/2022 3.8  3.5 - 5.1 mmol/L Final   Chloride 05/05/2022 109  98 - 111 mmol/L Final   CO2 05/05/2022 17 (L)  22 - 32 mmol/L Final   Glucose, Bld 05/05/2022 91  70 - 99 mg/dL Final   Glucose reference range applies only to samples taken after fasting for at least 8 hours.   BUN 05/05/2022 9  8 - 23 mg/dL Final   Creatinine, Ser 05/05/2022 0.87  0.61 - 1.24 mg/dL Final   Calcium 05/05/2022 8.6 (L)  8.9 - 10.3 mg/dL Final   Total Protein 05/05/2022 7.9  6.5 - 8.1 g/dL Final   Albumin 05/05/2022 3.3 (L)  3.5 - 5.0 g/dL Final   AST 05/05/2022 33  15 - 41 U/L Final   ALT 05/05/2022 23  0 - 44 U/L Final   Alkaline Phosphatase 05/05/2022 60  38 - 126 U/L Final   Total Bilirubin 05/05/2022 0.9  0.3 - 1.2 mg/dL Final   GFR, Estimated 05/05/2022 >60  >60 mL/min Final   Comment: (NOTE) Calculated using the CKD-EPI Creatinine Equation (2021)    Anion gap 05/05/2022 12  5 - 15 Final   Performed at [place] [address] [place], [place] [address]   Alcohol, Ethyl (B) 05/05/2022 169 (H)  <10 mg/dL Final   Comment: (NOTE) Lowest detectable limit for serum alcohol is 10 mg/dL.  For medical purposes only. Performed at [facility], [address], [place], [place] [address]    Salicylate Lvl 05/05/2022 <7.0 (L)  7.0 - 30.0 mg/dL Final   Performed at [facility], [address], [place], [place] [address]   Acetaminophen (Tylenol), Serum 05/05/2022 <10 (L)  10 - 30 ug/mL Final   Comment: (NOTE) Therapeutic concentrations vary significantly. A range of 10-30 ug/mL  may be an effective  concentration for many patients. However, some  are best treated at concentrations outside of this range. Acetaminophen concentrations >150 ug/mL at 4 hours after ingestion  and >50 ug/mL at 12 hours after ingestion are often associated with  toxic reactions.  Performed at [facility], [address], [place], [place] [address]    Opiates 05/06/2022 NONE DETECTED  NONE DETECTED Final   Cocaine 05/06/2022 NONE DETECTED  NONE DETECTED Final   Benzodiazepines 05/06/2022 POSITIVE (A)  NONE DETECTED Final   Amphetamines 05/06/2022 NONE DETECTED  NONE DETECTED Final   Tetrahydrocannabinol 05/06/2022 NONE DETECTED  NONE DETECTED Final   Barbiturates 05/06/2022 NONE DETECTED  NONE DETECTED Final   Comment: (NOTE) DRUG SCREEN FOR MEDICAL PURPOSES ONLY.  IF CONFIRMATION IS NEEDED FOR ANY PURPOSE, NOTIFY LAB WITHIN 5 DAYS.  LOWEST DETECTABLE LIMITS FOR URINE DRUG SCREEN Drug Class                     Cutoff (ng/mL) Amphetamine and metabolites    1000 Barbiturate and metabolites    200 Benzodiazepine                 200 Tricyclics and metabolites     300 Opiates and metabolites        300 Cocaine and metabolites        300 THC                            50 Performed at [facility], [address], [place], [place] [address]    SARS Coronavirus 2 by RT PCR 05/06/2022 NEGATIVE  NEGATIVE Final   Comment: (NOTE) SARS-CoV-2 target nucleic acids are NOT DETECTED.  The SARS-CoV-2 RNA is generally detectable in upper respiratory specimens during the acute phase of infection. The lowest concentration of SARS-CoV-2 viral copies this assay can detect is 138 copies/mL. A negative result does not preclude SARS-Cov-2 infection and should not be used as the sole basis for treatment or other patient management decisions. A negative result may occur with  improper specimen collection/handling, submission of specimen other than nasopharyngeal swab, presence of viral mutation(s) within  the areas targeted by this assay, and inadequate number of viral copies(<138 copies/mL). A negative result must be combined with clinical observations, patient history, and epidemiological information. The expected result is Negative.  Fact Sheet for Patients:  BloggerCourse.com  Fact Sheet for Healthcare Providers:  SeriousBroker.it  This test is no                          t yet approved or cleared by the [place] FDA and  has been authorized for detection and/or diagnosis of SARS-CoV-2 by FDA under an Emergency Use Authorization (EUA). This EUA will remain  in effect (meaning this test can be used) for the duration of the COVID-19 declaration under Section 564(b)(1) of the Act, 21 U.S.C.section 360bbb-3(b)(1), unless the authorization is terminated  or revoked sooner.       Influenza A  by PCR 05/06/2022 NEGATIVE  NEGATIVE Final   Influenza B by PCR 05/06/2022 NEGATIVE  NEGATIVE Final   Comment: (NOTE) The Xpert Xpress SARS-CoV-2/FLU/RSV plus assay is intended as an aid in the diagnosis of influenza from Nasopharyngeal swab specimens and should not be used as a sole basis for treatment. Nasal washings and aspirates are unacceptable for Xpert Xpress SARS-CoV-2/FLU/RSV testing.  Fact Sheet for Patients: BloggerCourse.comhttps://www.fda.gov/media/152166/download  Fact Sheet for Healthcare Providers: SeriousBroker.ithttps://www.fda.gov/media/152162/download  This test is not yet approved or cleared by the [place] FDA and has been authorized for detection and/or diagnosis of SARS-CoV-2 by FDA under an Emergency Use Authorization (EUA). This EUA will remain in effect (meaning this test can be used) for the duration of the COVID-19 declaration under Section 564(b)(1) of the Act, 21 U.S.C. section 360bbb-3(b)(1), unless the authorization is terminated or revoked.  Performed at [facility], [address] [place], [place] [address]    Admission on 05/05/2022, Discharged on 05/05/2022  Component Date Value Ref Range Status   Sodium 05/05/2022 138  135 - 145 mmol/L Final   Potassium 05/05/2022 3.7  3.5 - 5.1 mmol/L Final   Chloride 05/05/2022 105  98 - 111 mmol/L Final   CO2 05/05/2022 19 (L)  22 - 32 mmol/L Final   Glucose, Bld 05/05/2022 91  70 - 99 mg/dL Final   Glucose reference range applies only to samples taken after fasting for at least 8 hours.   BUN 05/05/2022 11  8 - 23 mg/dL Final   Creatinine, Ser 05/05/2022 0.90  0.61 - 1.24 mg/dL Final   Calcium 29/56/213007/20/2023 8.9  8.9 - 10.3 mg/dL Final   GFR, Estimated 05/05/2022 >60  >60 mL/min Final   Comment: (NOTE) Calculated using the CKD-EPI Creatinine Equation (2021)    Anion gap 05/05/2022 14  5 - 15 Final   Performed at [facility], [address] [place], [place] [address]   WBC 05/05/2022 8.0  4.0 - 10.5 K/uL Final   RBC 05/05/2022 3.78 (L)  4.22 - 5.81 MIL/uL Final   Hemoglobin 05/05/2022 8.4 (L)  13.0 - 17.0 g/dL Final   Comment: Reticulocyte Hemoglobin testing may be clinically indicated, consider ordering this additional test [id number]    HCT 05/05/2022 27.4 (L)  39.0 - 52.0 % Final   MCV 05/05/2022 72.5 (L)  80.0 - 100.0 fL Final   MCH 05/05/2022 22.2 (L)  26.0 - 34.0 pg Final   MCHC 05/05/2022 30.7  30.0 - 36.0 g/dL Final   RDW 84/13/244007/20/2023 19.8 (H)  11.5 - 15.5 % Final   Platelets 05/05/2022 222  150 - 400 K/uL Final   nRBC 05/05/2022 0.0  0.0 - 0.2 % Final   Performed at [facility], [address] [place], [place] [address]   Troponin I (High Sensitivity) 05/05/2022 10  <18 ng/L Final   Comment: (NOTE) Elevated high sensitivity troponin I (hsTnI) values and significant  changes across serial measurements may suggest ACS but many other  chronic and acute conditions are known to elevate hsTnI results.  Refer to the "Links" section for chest pain algorithms and additional  guidance. Performed at [facility], [address] [place], [place] [address]    Troponin I (High Sensitivity) 05/05/2022 7  <18 ng/L Final   Comment: (NOTE) Elevated high sensitivity troponin I (hsTnI) values and significant  changes across serial measurements may suggest ACS but many other  chronic and acute conditions are known to elevate  hsTnI results.  Refer to the "Links" section for chest pain algorithms and additional  guidance. Performed at [facility], [address], [place], [place] [address]   Admission on 04/20/2022, Discharged on 05/03/2022  Component Date Value Ref Range Status   Sodium 04/20/2022 135  135 - 145 mmol/L Final   Potassium 04/20/2022 4.1  3.5 - 5.1 mmol/L Final   Chloride 04/20/2022 100  98 - 111 mmol/L Final   CO2 04/20/2022 19 (L)  22 - 32 mmol/L Final   Glucose, Bld 04/20/2022 90  70 - 99 mg/dL Final   Glucose reference range applies only to samples taken after fasting for at least 8 hours.   BUN 04/20/2022 10  8 - 23 mg/dL Final   Creatinine, Ser 04/20/2022 1.06  0.61 - 1.24 mg/dL Final   Calcium 78/46/9629 8.8 (L)  8.9 - 10.3 mg/dL Final   Total Protein 52/84/1324 7.3  6.5 - 8.1 g/dL Final   Albumin 40/07/2724 3.4 (L)  3.5 - 5.0 g/dL Final   AST 36/64/4034 74 (H)  15 - 41 U/L Final   ALT 04/20/2022 41  0 - 44 U/L Final   Alkaline Phosphatase 04/20/2022 69  38 - 126 U/L Final   Total Bilirubin 04/20/2022 1.4 (H)  0.3 - 1.2 mg/dL Final   GFR, Estimated 04/20/2022 >60  >60 mL/min Final   Comment: (NOTE) Calculated using the CKD-EPI Creatinine Equation (2021)    Anion gap 04/20/2022 16 (H)  5 - 15 Final   Performed at [facility], [address], [place], [place] [address]   Sodium 04/20/2022 135  135 - 145 mmol/L Final   Potassium 04/20/2022 4.2  3.5 - 5.1 mmol/L Final   Chloride 04/20/2022 103  98 - 111 mmol/L Final   BUN 04/20/2022 10  8 - 23 mg/dL Final   Creatinine, Ser 04/20/2022 1.30 (H)  0.61 - 1.24 mg/dL Final   Glucose, Bld 56/38/7564 93  70 - 99 mg/dL Final   Glucose  reference range applies only to samples taken after fasting for at least 8 hours.   Calcium, Ion 04/20/2022 0.88 (LL)  1.15 - 1.40 mmol/L Final   TCO2 04/20/2022 21 (L)  22 - 32 mmol/L Final   Hemoglobin 04/20/2022 12.9 (L)  13.0 - 17.0 g/dL Final   HCT 33/29/5188 38.0 (L)  39.0 - 52.0 % Final   Comment 04/20/2022 NOTIFIED PHYSICIAN   Final   WBC 04/20/2022 6.9  4.0 - 10.5 K/uL Final   RBC 04/20/2022 4.91  4.22 - 5.81 MIL/uL Final   Hemoglobin 04/20/2022 11.1 (L)  13.0 - 17.0 g/dL Final   HCT 41/66/0630 38.0 (L)  39.0 - 52.0 % Final   MCV 04/20/2022 77.4 (L)  80.0 - 100.0 fL Final   MCH 04/20/2022 22.6 (L)  26.0 - 34.0 pg Final   MCHC 04/20/2022 29.2 (L)  30.0 - 36.0 g/dL Final   RDW 16/10/930 21.0 (H)  11.5 - 15.5 % Final   Platelets 04/20/2022 139 (L)  150 - 400 K/uL Final   nRBC 04/20/2022 0.0  0.0 - 0.2 % Final   Performed at [facility], [address], [place], [place] [address]   Alcohol, Ethyl (B) 04/20/2022 262 (H)  <10 mg/dL Final   Comment: (NOTE) Lowest detectable limit for serum alcohol is 10 mg/dL.  For medical purposes only. Performed at [facility], [address], [place], [place] [address]    Color, Urine 04/20/2022  YELLOW  YELLOW Final   APPearance 04/20/2022 CLEAR  CLEAR Final   Specific Gravity, Urine 04/20/2022 1.008  1.005 - 1.030 Final   pH 04/20/2022 7.0  5.0 - 8.0 Final   Glucose, UA 04/20/2022 NEGATIVE  NEGATIVE mg/dL Final   Hgb urine dipstick 04/20/2022 NEGATIVE  NEGATIVE Final   Bilirubin Urine 04/20/2022 NEGATIVE  NEGATIVE Final   Ketones, ur 04/20/2022 NEGATIVE  NEGATIVE mg/dL Final   Protein, ur 16/07/9603 NEGATIVE  NEGATIVE mg/dL Final   Nitrite 54/06/8118 NEGATIVE  NEGATIVE Final   Leukocytes,Ua 04/20/2022 NEGATIVE  NEGATIVE Final   Performed at [facility], [address] [place], [place] [address]   Lactic Acid, Venous 04/20/2022 3.2 (HH)  0.5 - 1.9 mmol/L Final   Comment: CRITICAL RESULT CALLED TO, READ BACK BY AND  VERIFIED WITH: M.RUGGIERO,RN @1959  04/20/2022 [email or web address] Performed at [facility], [address], [place], [place] [address]    Prothrombin Time 04/20/2022 14.1  11.4 - 15.2 seconds Final   INR 04/20/2022 1.1  0.8 - 1.2 Final   Comment: (NOTE) INR goal varies based on device and disease states. Performed at [facility], [address] [place], [place] [address]    Blood Bank Specimen 04/20/2022 SAMPLE AVAILABLE FOR TESTING   Final   Sample Expiration 04/20/2022    Final                   Value:04/21/2022,2359 Performed at [facility], [address], [place], [place] [address]    Sodium 04/21/2022 141  135 - 145 mmol/L Final   Potassium 04/21/2022 4.1  3.5 - 5.1 mmol/L Final   Chloride 04/21/2022 102  98 - 111 mmol/L Final   CO2 04/21/2022 25  22 - 32 mmol/L Final   Glucose, Bld 04/21/2022 104 (H)  70 - 99 mg/dL Final   Glucose reference range applies only to samples taken after fasting for at least 8 hours.   BUN 04/21/2022 12  8 - 23 mg/dL Final   Creatinine, Ser 04/21/2022 0.97  0.61 - 1.24 mg/dL Final   Calcium 62/95/2841 9.1  8.9 - 10.3 mg/dL Final   GFR, Estimated 04/21/2022 >60  >60 mL/min Final   Comment: (NOTE) Calculated using the CKD-EPI Creatinine Equation (2021)    Anion gap 04/21/2022 14  5 - 15 Final   Performed at [facility], [address] [place], [place] [address]   WBC 04/21/2022 5.8  4.0 - 10.5 K/uL Final   RBC 04/21/2022 3.94 (L)  4.22 - 5.81 MIL/uL Final   Hemoglobin 04/21/2022 9.1 (L)  13.0 - 17.0 g/dL Final   HCT 08/13/2535 29.3 (L)  39.0 - 52.0 % Final   MCV 04/21/2022 74.4 (L)  80.0 - 100.0 fL Final   MCH 04/21/2022 23.1 (L)  26.0 - 34.0 pg Final   MCHC 04/21/2022 31.1  30.0 - 36.0 g/dL Final   RDW 64/40/3474 20.3 (H)  11.5 - 15.5 % Final   Platelets 04/21/2022 99 (L)  150 - 400 K/uL Final   Comment: Immature Platelet Fraction may be clinically indicated, consider ordering this additional test [id number] REPEATED TO  VERIFY PLATELET COUNT CONFIRMED BY SMEAR    nRBC 04/21/2022 0.0  0.0 - 0.2 % Final   Performed at [facility], [address] [place], [place] [address]   HIV Screen 4th Generation wRfx 04/21/2022 Non Reactive  Non Reactive Final   Performed at [facility],  [address], [place], [place] [address]   Sodium 04/22/2022 138  135 - 145 mmol/L Final   Potassium 04/22/2022 3.7  3.5 - 5.1 mmol/L Final   Chloride 04/22/2022 103  98 - 111 mmol/L Final   CO2 04/22/2022 26  22 - 32 mmol/L Final   Glucose, Bld 04/22/2022 124 (H)  70 - 99 mg/dL Final   Glucose reference range applies only to samples taken after fasting for at least 8 hours.   BUN 04/22/2022 9  8 - 23 mg/dL Final   Creatinine, Ser 04/22/2022 0.90  0.61 - 1.24 mg/dL Final   Calcium 59/56/3875 8.6 (L)  8.9 - 10.3 mg/dL Final   GFR, Estimated 04/22/2022 >60  >60 mL/min Final   Comment: (NOTE) Calculated using the CKD-EPI Creatinine Equation (2021)    Anion gap 04/22/2022 9  5 - 15 Final   Performed at [facility], [address] [place], [place] [address]   WBC 04/22/2022 3.6 (L)  4.0 - 10.5 K/uL Final   RBC 04/22/2022 3.55 (L)  4.22 - 5.81 MIL/uL Final   Hemoglobin 04/22/2022 8.2 (L)  13.0 - 17.0 g/dL Final   Comment: Reticulocyte Hemoglobin testing may be clinically indicated, consider ordering this additional test [id number]    HCT 04/22/2022 26.6 (L)  39.0 - 52.0 % Final   MCV 04/22/2022 74.9 (L)  80.0 - 100.0 fL Final   MCH 04/22/2022 23.1 (L)  26.0 - 34.0 pg Final   MCHC 04/22/2022 30.8  30.0 - 36.0 g/dL Final   RDW 60/63/0160 20.5 (H)  11.5 - 15.5 % Final   Platelets 04/22/2022 73 (L)  150 - 400 K/uL Final   Comment: Immature Platelet Fraction may be clinically indicated, consider ordering this additional test [id number] REPEATED TO VERIFY    nRBC 04/22/2022 0.0  0.0 - 0.2 % Final   Performed at [facility], [address], [place], [place] [address]   WBC 04/23/2022 3.7 (L)  4.0 - 10.5  K/uL Final   RBC 04/23/2022 3.61 (L)  4.22 - 5.81 MIL/uL Final   Hemoglobin 04/23/2022 8.4 (L)  13.0 - 17.0 g/dL Final   Comment: Reticulocyte Hemoglobin testing may be clinically indicated, consider ordering this additional test [id number]    HCT 04/23/2022 27.0 (L)  39.0 - 52.0 % Final   MCV 04/23/2022 74.8 (L)  80.0 - 100.0 fL Final   MCH 04/23/2022 23.3 (L)  26.0 - 34.0 pg Final   MCHC 04/23/2022 31.1  30.0 - 36.0 g/dL Final   RDW 37/62/8315 20.9 (H)  11.5 - 15.5 % Final   Platelets 04/23/2022 73 (L)  150 - 400 K/uL Final   Comment: Immature Platelet Fraction may be clinically indicated, consider ordering this additional test [id number] CONSISTENT WITH PREVIOUS RESULT REPEATED TO VERIFY    nRBC 04/23/2022 0.0  0.0 - 0.2 % Final   Performed at [facility], [address] [place], [place] [address]   WBC 04/26/2022 3.8 (L)  4.0 - 10.5 K/uL Final   RBC 04/26/2022 3.34 (L)  4.22 - 5.81 MIL/uL Final   Hemoglobin 04/26/2022 7.7 (L)  13.0 - 17.0 g/dL Final   Comment: Reticulocyte Hemoglobin testing may be clinically indicated, consider ordering this additional test [id number]    HCT 04/26/2022 25.1 (L)  39.0 - 52.0 % Final   MCV 04/26/2022 75.1 (L)  80.0 - 100.0 fL Final   MCH 04/26/2022 23.1 (L)  26.0 - 34.0 pg Final  MCHC 04/26/2022 30.7  30.0 - 36.0 g/dL Final   RDW 16/07/9603 20.3 (H)  11.5 - 15.5 % Final   Platelets 04/26/2022 98 (L)  150 - 400 K/uL Final   REPEATED TO VERIFY   nRBC 04/26/2022 0.0  0.0 - 0.2 % Final   Performed at [facility], [address], [place], [place] [address]   Sodium 04/26/2022 140  135 - 145 mmol/L Final   Potassium 04/26/2022 4.5  3.5 - 5.1 mmol/L Final   Chloride 04/26/2022 105  98 - 111 mmol/L Final   CO2 04/26/2022 28  22 - 32 mmol/L Final   Glucose, Bld 04/26/2022 101 (H)  70 - 99 mg/dL Final   Glucose reference range applies only to samples taken after fasting for at least 8 hours.   BUN 04/26/2022 12  8 - 23 mg/dL Final    Creatinine, Ser 04/26/2022 1.04  0.61 - 1.24 mg/dL Final   Calcium 11/91/4782 8.8 (L)  8.9 - 10.3 mg/dL Final   GFR, Estimated 04/26/2022 >60  >60 mL/min Final   Comment: (NOTE) Calculated using the CKD-EPI Creatinine Equation (2021)    Anion gap 04/26/2022 7  5 - 15 Final   Performed at [facility], [address] [place], [place] [address]   WBC 04/27/2022 3.8 (L)  4.0 - 10.5 K/uL Final   RBC 04/27/2022 3.59 (L)  4.22 - 5.81 MIL/uL Final   Hemoglobin 04/27/2022 8.2 (L)  13.0 - 17.0 g/dL Final   Comment: Reticulocyte Hemoglobin testing may be clinically indicated, consider ordering this additional test [id number]    HCT 04/27/2022 27.1 (L)  39.0 - 52.0 % Final   MCV 04/27/2022 75.5 (L)  80.0 - 100.0 fL Final   MCH 04/27/2022 22.8 (L)  26.0 - 34.0 pg Final   MCHC 04/27/2022 30.3  30.0 - 36.0 g/dL Final   RDW 69/62/9528 20.1 (H)  11.5 - 15.5 % Final   Platelets 04/27/2022 114 (L)  150 - 400 K/uL Final   REPEATED TO VERIFY   nRBC 04/27/2022 0.0  0.0 - 0.2 % Final   Performed at [facility], [address] [place], [place] [address]   Sodium 04/27/2022 139  135 - 145 mmol/L Final   Potassium 04/27/2022 4.2  3.5 - 5.1 mmol/L Final   Chloride 04/27/2022 108  98 - 111 mmol/L Final   CO2 04/27/2022 25  22 - 32 mmol/L Final   Glucose, Bld 04/27/2022 117 (H)  70 - 99 mg/dL Final   Glucose reference range applies only to samples taken after fasting for at least 8 hours.   BUN 04/27/2022 9  8 - 23 mg/dL Final   Creatinine, Ser 04/27/2022 1.16  0.61 - 1.24 mg/dL Final   Calcium 40/07/2724 8.7 (L)  8.9 - 10.3 mg/dL Final   GFR, Estimated 04/27/2022 >60  >60 mL/min Final   Comment: (NOTE) Calculated using the CKD-EPI Creatinine Equation (2021)    Anion gap 04/27/2022 6  5 - 15 Final   Performed at [facility], [address] [place], [place] [address]   WBC 04/28/2022 4.5  4.0 - 10.5 K/uL Final   RBC 04/28/2022 3.39 (L)  4.22 - 5.81 MIL/uL Final   Hemoglobin  04/28/2022 7.7 (L)  13.0 - 17.0 g/dL Final   Comment: Reticulocyte Hemoglobin testing may be clinically indicated, consider ordering this additional test [id number]    HCT 04/28/2022 25.4 (L)  39.0 - 52.0 % Final  MCV 04/28/2022 74.9 (L)  80.0 - 100.0 fL Final   MCH 04/28/2022 22.7 (L)  26.0 - 34.0 pg Final   MCHC 04/28/2022 30.3  30.0 - 36.0 g/dL Final   RDW 16/07/9603 19.9 (H)  11.5 - 15.5 % Final   Platelets 04/28/2022 125 (L)  150 - 400 K/uL Final   REPEATED TO VERIFY   nRBC 04/28/2022 0.0  0.0 - 0.2 % Final   Performed at [facility], [address], [place], [place] [address]   WBC 04/29/2022 4.3  4.0 - 10.5 K/uL Final   RBC 04/29/2022 3.39 (L)  4.22 - 5.81 MIL/uL Final   Hemoglobin 04/29/2022 7.8 (L)  13.0 - 17.0 g/dL Final   Comment: Reticulocyte Hemoglobin testing may be clinically indicated, consider ordering this additional test [id number]    HCT 04/29/2022 25.1 (L)  39.0 - 52.0 % Final   MCV 04/29/2022 74.0 (L)  80.0 - 100.0 fL Final   MCH 04/29/2022 23.0 (L)  26.0 - 34.0 pg Final   MCHC 04/29/2022 31.1  30.0 - 36.0 g/dL Final   RDW 95/62/1308 19.9 (H)  11.5 - 15.5 % Final   Platelets 04/29/2022 125 (L)  150 - 400 K/uL Final   REPEATED TO VERIFY   nRBC 04/29/2022 0.0  0.0 - 0.2 % Final   Performed at [facility], [address] [place], [place] [address]   WBC 05/01/2022 3.6 (L)  4.0 - 10.5 K/uL Final   RBC 05/01/2022 3.13 (L)  4.22 - 5.81 MIL/uL Final   Hemoglobin 05/01/2022 7.1 (L)  13.0 - 17.0 g/dL Final   Comment: Reticulocyte Hemoglobin testing may be clinically indicated, consider ordering this additional test [id number]    HCT 05/01/2022 23.0 (L)  39.0 - 52.0 % Final   MCV 05/01/2022 73.5 (L)  80.0 - 100.0 fL Final   MCH 05/01/2022 22.7 (L)  26.0 - 34.0 pg Final   MCHC 05/01/2022 30.9  30.0 - 36.0 g/dL Final   RDW 41/32/4401 19.8 (H)  11.5 - 15.5 % Final   Platelets 05/01/2022 149 (L)  150 - 400 K/uL Final   REPEATED TO VERIFY   nRBC  05/01/2022 0.0  0.0 - 0.2 % Final   Performed at [facility], [address] [place], [place] [address]   Sodium 05/01/2022 137  135 - 145 mmol/L Final   Potassium 05/01/2022 4.0  3.5 - 5.1 mmol/L Final   Chloride 05/01/2022 109  98 - 111 mmol/L Final   CO2 05/01/2022 23  22 - 32 mmol/L Final   Glucose, Bld 05/01/2022 98  70 - 99 mg/dL Final   Glucose reference range applies only to samples taken after fasting for at least 8 hours.   BUN 05/01/2022 14  8 - 23 mg/dL Final   Creatinine, Ser 05/01/2022 0.83  0.61 - 1.24 mg/dL Final   Calcium 36/64/4034 8.4 (L)  8.9 - 10.3 mg/dL Final   GFR, Estimated 05/01/2022 >60  >60 mL/min Final   Comment: (NOTE) Calculated using the CKD-EPI Creatinine Equation (2021)    Anion gap 05/01/2022 5  5 - 15 Final   Performed at [facility], [address] [place], [place] [address]   WBC 05/02/2022 7.8  4.0 - 10.5 K/uL Final   RBC 05/02/2022 3.23 (L)  4.22 - 5.81 MIL/uL Final   Hemoglobin 05/02/2022 7.2 (L)  13.0 - 17.0 g/dL Final   Comment: Reticulocyte Hemoglobin testing may be clinically indicated, consider  ordering this additional test [id number]    HCT 05/02/2022 23.6 (L)  39.0 - 52.0 % Final   MCV 05/02/2022 73.1 (L)  80.0 - 100.0 fL Final   MCH 05/02/2022 22.3 (L)  26.0 - 34.0 pg Final   MCHC 05/02/2022 30.5  30.0 - 36.0 g/dL Final   RDW 30/86/5784 19.5 (H)  11.5 - 15.5 % Final   Platelets 05/02/2022 153  150 - 400 K/uL Final   REPEATED TO VERIFY   nRBC 05/02/2022 0.0  0.0 - 0.2 % Final   Performed at [facility], [address], [place], [place] [address]  Admission on 03/31/2022, Discharged on 04/02/2022  Component Date Value Ref Range Status   Sodium 03/31/2022 140  135 - 145 mmol/L Final   Potassium 03/31/2022 3.6  3.5 - 5.1 mmol/L Final   Chloride 03/31/2022 108  98 - 111 mmol/L Final   CO2 03/31/2022 19 (L)  22 - 32 mmol/L Final   Glucose, Bld 03/31/2022 84  70 - 99 mg/dL Final   Glucose reference range applies  only to samples taken after fasting for at least 8 hours.   BUN 03/31/2022 9  8 - 23 mg/dL Final   Creatinine, Ser 03/31/2022 0.86  0.61 - 1.24 mg/dL Final   Calcium 52/84/1324 8.6 (L)  8.9 - 10.3 mg/dL Final   Total Protein 40/07/2724 7.2  6.5 - 8.1 g/dL Final   Albumin 36/64/4034 3.5  3.5 - 5.0 g/dL Final   AST 74/25/9563 56 (H)  15 - 41 U/L Final   ALT 03/31/2022 35  0 - 44 U/L Final   Alkaline Phosphatase 03/31/2022 61  38 - 126 U/L Final   Total Bilirubin 03/31/2022 2.0 (H)  0.3 - 1.2 mg/dL Final   GFR, Estimated 03/31/2022 >60  >60 mL/min Final   Comment: (NOTE) Calculated using the CKD-EPI Creatinine Equation (2021)    Anion gap 03/31/2022 13  5 - 15 Final   Performed at [facility], [address], [place], [place] [address]   Alcohol, Ethyl (B) 03/31/2022 203 (H)  <10 mg/dL Final   Comment: (NOTE) Lowest detectable limit for serum alcohol is 10 mg/dL.  For medical purposes only. Performed at [facility], [address], [place], [place] [address]    Salicylate Lvl 03/31/2022 <7.0 (L)  7.0 - 30.0 mg/dL Final   Performed at [facility], [address] [place], [place] [address]   Acetaminophen (Tylenol), Serum 03/31/2022 <10 (L)  10 - 30 ug/mL Final   Comment: (NOTE) Therapeutic concentrations vary significantly. A range of 10-30 ug/mL  may be an effective concentration for many patients. However, some  are best treated at concentrations outside of this range. Acetaminophen concentrations >150 ug/mL at 4 hours after ingestion  and >50 ug/mL at 12 hours after ingestion are often associated with  toxic reactions.  Performed at [facility], [address], [place], [place] [address]    WBC 03/31/2022 4.4  4.0 - 10.5 K/uL Final   RBC 03/31/2022 5.10  4.22 - 5.81 MIL/uL Final   Hemoglobin 03/31/2022 11.8 (L)  13.0 - 17.0 g/dL Final   HCT 01/60/1093 38.5 (L)  39.0 - 52.0 % Final   MCV 03/31/2022 75.5 (L)  80.0 - 100.0 fL Final   MCH 03/31/2022  23.1 (L)  26.0 - 34.0 pg Final   MCHC 03/31/2022 30.6  30.0 - 36.0 g/dL Final   RDW 23/55/7322 19.4 (H)  11.5 -  15.5 % Final   Platelets 03/31/2022 102 (L)  150 - 400 K/uL Final   Comment: Immature Platelet Fraction may be clinically indicated, consider ordering this additional test [id number] REPEATED TO VERIFY PLATELET COUNT CONFIRMED BY SMEAR    nRBC 03/31/2022 0.0  0.0 - 0.2 % Final   Performed at [facility], [address], [place], [place] [address]   Opiates 03/31/2022 NONE DETECTED  NONE DETECTED Final   Cocaine 03/31/2022 NONE DETECTED  NONE DETECTED Final   Benzodiazepines 03/31/2022 NONE DETECTED  NONE DETECTED Final   Amphetamines 03/31/2022 NONE DETECTED  NONE DETECTED Final   Tetrahydrocannabinol 03/31/2022 NONE DETECTED  NONE DETECTED Final   Barbiturates 03/31/2022 NONE DETECTED  NONE DETECTED Final   Comment: (NOTE) DRUG SCREEN FOR MEDICAL PURPOSES ONLY.  IF CONFIRMATION IS NEEDED FOR ANY PURPOSE, NOTIFY LAB WITHIN 5 DAYS.  LOWEST DETECTABLE LIMITS FOR URINE DRUG SCREEN Drug Class                     Cutoff (ng/mL) Amphetamine and metabolites    1000 Barbiturate and metabolites    200 Benzodiazepine                 200 Tricyclics and metabolites     300 Opiates and metabolites        300 Cocaine and metabolites        300 THC                            50 Performed at [facility], [address] [place], [place] [address]    SARS Coronavirus 2 by RT PCR 03/31/2022 NEGATIVE  NEGATIVE Final   Comment: (NOTE) SARS-CoV-2 target nucleic acids are NOT DETECTED.  The SARS-CoV-2 RNA is generally detectable in upper and lower respiratory specimens during the acute phase of infection. The lowest concentration of SARS-CoV-2 viral copies this assay can detect is 250 copies / mL. A negative result does not preclude SARS-CoV-2 infection and should not be used as the sole basis for treatment or other patient management decisions.  A negative result may  occur with improper specimen collection / handling, submission of specimen other than nasopharyngeal swab, presence of viral mutation(s) within the areas targeted by this assay, and inadequate number of viral copies (<250 copies / mL). A negative result must be combined with clinical observations, patient history, and epidemiological information.  Fact Sheet for Patients:   RoadLapTop.co.za  Fact Sheet for Healthcare Providers: http://kim-miller.com/  This test is not yet approved or                           cleared by the [place] FDA and has been authorized for detection and/or diagnosis of SARS-CoV-2 by FDA under an Emergency Use Authorization (EUA).  This EUA will remain in effect (meaning this test can be used) for the duration of the COVID-19 declaration under Section 564(b)(1) of the Act, 21 U.S.C. section 360bbb-3(b)(1), unless the authorization is terminated or revoked sooner.  Performed at [facility], [address] [place], [place] [address]   Admission on 01/22/2022, Discharged on 01/22/2022  Component Date Value Ref Range Status   Lipase 01/22/2022 44  11 - 51 U/L Final   Performed at [facility], [address] [place], [place] [address]   Sodium 01/22/2022 138  135 - 145 mmol/L  Final   Potassium 01/22/2022 3.4 (L)  3.5 - 5.1 mmol/L Final   Chloride 01/22/2022 105  98 - 111 mmol/L Final   CO2 01/22/2022 24  22 - 32 mmol/L Final   Glucose, Bld 01/22/2022 90  70 - 99 mg/dL Final   Glucose reference range applies only to samples taken after fasting for at least 8 hours.   BUN 01/22/2022 10  8 - 23 mg/dL Final   Creatinine, Ser 01/22/2022 0.87  0.61 - 1.24 mg/dL Final   Calcium 65/78/4696 8.9  8.9 - 10.3 mg/dL Final   Total Protein 29/52/8413 7.9  6.5 - 8.1 g/dL Final   Albumin 24/40/1027 3.7  3.5 - 5.0 g/dL Final   AST 25/36/6440 43 (H)  15 - 41 U/L Final   ALT 01/22/2022 31  0 - 44 U/L  Final   Alkaline Phosphatase 01/22/2022 74  38 - 126 U/L Final   Total Bilirubin 01/22/2022 2.0 (H)  0.3 - 1.2 mg/dL Final   GFR, Estimated 01/22/2022 >60  >60 mL/min Final   Comment: (NOTE) Calculated using the CKD-EPI Creatinine Equation (2021)    Anion gap 01/22/2022 9  5 - 15 Final   Performed at [facility], [address] [place], [place] [address]   WBC 01/22/2022 4.7  4.0 - 10.5 K/uL Final   RBC 01/22/2022 5.84 (H)  4.22 - 5.81 MIL/uL Final   Hemoglobin 01/22/2022 13.1  13.0 - 17.0 g/dL Final   HCT 59/56/3875 43.9  39.0 - 52.0 % Final   MCV 01/22/2022 75.2 (L)  80.0 - 100.0 fL Final   MCH 01/22/2022 22.4 (L)  26.0 - 34.0 pg Final   MCHC 01/22/2022 29.8 (L)  30.0 - 36.0 g/dL Final   RDW 64/33/2951 19.7 (H)  11.5 - 15.5 % Final   Platelets 01/22/2022 128 (L)  150 - 400 K/uL Final   nRBC 01/22/2022 0.0  0.0 - 0.2 % Final   Performed at [facility], [address], [place], [place] [address]   Color, Urine 01/22/2022 STRAW (A)  YELLOW Final   APPearance 01/22/2022 CLEAR  CLEAR Final   Specific Gravity, Urine 01/22/2022 1.002 (L)  1.005 - 1.030 Final   pH 01/22/2022 5.0  5.0 - 8.0 Final   Glucose, UA 01/22/2022 NEGATIVE  NEGATIVE mg/dL Final   Hgb urine dipstick 01/22/2022 NEGATIVE  NEGATIVE Final   Bilirubin Urine 01/22/2022 NEGATIVE  NEGATIVE Final   Ketones, ur 01/22/2022 NEGATIVE  NEGATIVE mg/dL Final   Protein, ur 60/63/0160 NEGATIVE  NEGATIVE mg/dL Final   Nitrite 10/93/2355 NEGATIVE  NEGATIVE Final   Leukocytes,Ua 01/22/2022 NEGATIVE  NEGATIVE Final   Performed at [facility], [address] [place], [place] [address]   Opiates 01/22/2022 NONE DETECTED  NONE DETECTED Final   Cocaine 01/22/2022 NONE DETECTED  NONE DETECTED Final   Benzodiazepines 01/22/2022 NONE DETECTED  NONE DETECTED Final   Amphetamines 01/22/2022 NONE DETECTED  NONE DETECTED Final   Tetrahydrocannabinol 01/22/2022 NONE DETECTED  NONE  DETECTED Final   Barbiturates 01/22/2022 NONE DETECTED  NONE DETECTED Final   Comment: (NOTE) DRUG SCREEN FOR MEDICAL PURPOSES ONLY.  IF CONFIRMATION IS NEEDED FOR ANY PURPOSE, NOTIFY LAB WITHIN 5 DAYS.  LOWEST DETECTABLE LIMITS FOR URINE DRUG SCREEN Drug Class                     Cutoff (ng/mL) Amphetamine and metabolites    1000 Barbiturate and metabolites    200  Benzodiazepine                 200 Tricyclics and metabolites     300 Opiates and metabolites        300 Cocaine and metabolites        300 THC                            50 Performed at [facility], [address] [place], [place] [address]    Alcohol, Ethyl (B) 01/22/2022 134 (H)  <10 mg/dL Final   Comment: (NOTE) Lowest detectable limit for serum alcohol is 10 mg/dL.  For medical purposes only. Performed at [facility], [address], [place], [place] [address]    Specimen Description 01/22/2022    Final                   Value:URINE, CLEAN CATCH Performed at [facility], [address] [place], [place] [address]    Special Requests 01/22/2022    Final                   Value:NONE Performed at [facility], [address] [place], [place] [address]    Culture 01/22/2022    Final                   Value:NO GROWTH Performed at [facility], [address] [place], [place] [address]    Report Status 01/22/2022 01/23/2022 FINAL   Final   Ammonia 01/22/2022 18  9 - 35 umol/L Final   Performed at [facility], [address], [place], [place] [address]   Acetaminophen (Tylenol), Serum 01/22/2022 <10 (L)  10 - 30 ug/mL Final   Comment: (NOTE) Therapeutic concentrations vary significantly. A range of 10-30 ug/mL  may be an effective concentration for many patients. However, some  are best treated at concentrations outside of this range. Acetaminophen concentrations >150 ug/mL at 4 hours after ingestion   and >50 ug/mL at 12 hours after ingestion are often associated with  toxic reactions.  Performed at [facility], [address] [place], [place] [address]    Salicylate Lvl 01/22/2022 <7.0 (L)  7.0 - 30.0 mg/dL Final   Performed at [facility], [address], [place], [place] [address]  There may be more visits with results that are not included.   Blood Alcohol level:  Lab Results  Component Value Date   ETH <10 07/06/2022   ETH <10 06/11/2022   Metabolic Disorder Labs: Lab Results  Component Value Date   HGBA1C 5.2 05/24/2022   MPG 102.54 05/24/2022   MPG 108 05/06/2009   No results found for: "PROLACTIN" Lab Results  Component Value Date   CHOL 140 05/31/2022   TRIG 91 05/31/2022   HDL 54 05/31/2022   CHOLHDL 2.6 05/31/2022   VLDL 18 05/31/2022   LDLCALC 68 05/31/2022   LDLCALC NOT CALCULATED 05/24/2022   Therapeutic Lab Levels: Lab Results  Component Value Date   LITHIUM <0.06 (L) 02/09/2021   No results found for: "VALPROATE" Lab Results  Component Value Date   CBMZ <0.3 (L) 11/19/2010   Physical Findings   AUDIT    Flowsheet Row ED from 05/29/2022 in [facility]  Alcohol Use Disorder Identification Test Final Score (AUDIT) 14      CAGE-AID  Flowsheet Row ED to Hosp-Admission (Discharged) from 04/20/2022 in [facility]  SURGICAL ED to Hosp-Admission (Discharged) from 02/07/2021 in [facility]  CAGE-AID Score 4 0      PHQ2-9    Flowsheet Row ED from 07/06/2022 in [facility] ED from 06/10/2022 in [facility] ED from 05/29/2022 in [facility] ED from 01/12/2022 in [facility] ED from 01/05/2022 in [facility] EMERGENCY DEPARTMENT  PHQ-2 Total Score 6 6 6 2 5   PHQ-9 Total Score 23 21 17 7 24        Flowsheet Row ED from 07/06/2022 in [facility] ED from 06/10/2022 in [facility] ED from 05/29/2022 in [facility]  C-SSRS RISK CATEGORY No Risk Moderate Risk No Risk       Musculoskeletal  Strength & Muscle Tone: within normal limits Gait & Station: normal Patient leans: N/A   Psychiatric Specialty Exam   Presentation  General Appearance:Disheveled Eye Contact:Fair Speech:Normal Rate, Clear and Coherent Volume:Normal Handedness:Right  Mood and Affect  Mood:Dysphoric, Irritable Affect:Appropriate, Congruent, Constricted  Thought Process  Thought Process:Coherent, Goal Directed, Irrevelant Descriptions of Associations:Tangential  Thought Content Suicidal Thoughts:Suicidal Thoughts: Yes, Passive (contracted to safety) SI Active Intent and/or Plan: Without Intent, Without Plan Homicidal Thoughts:Homicidal Thoughts: No Hallucinations:Hallucinations: None Ideas of Reference:None Thought Content:Rumination, Scattered, Tangential, Perseveration  Sensorium  Memory:Immediate Fair Judgment:Impaired Insight:Shallow  Executive Functions  Orientation:Full (Time, Place and Person) Language:Good Concentration:Good Attention:Good Recall:Good Fund of Knowledge:Fair  Psychomotor Activity  Psychomotor Activity:Psychomotor Activity: Normal  Assets  Assets:Communication Skills, Desire for Improvement  Sleep  Quality:Poor  Physical Exam  BP 125/73 (BP Location: Right Arm)   Pulse 79   Temp 98.6 F (37 C) (Oral)   Resp 18   SpO2 99%  Physical Exam Vitals and nursing note reviewed.  Constitutional:      General: He is not in acute distress.    Appearance: He is not ill-appearing, toxic-appearing or diaphoretic.  HENT:     Head: Normocephalic.  Pulmonary:     Effort: Pulmonary effort is normal. No respiratory distress.  Abdominal:     General: Abdomen is protuberant. There is no  distension.     Palpations: There is no shifting dullness.     Tenderness: There is no abdominal tenderness. There is no guarding or rebound.  Neurological:     Mental Status: He is alert.      Assessment / Plan  Total Time spent with patient: 45 minutes Treatment Plan Summary: Daily contact with patient to assess and evaluate symptoms and progress in treatment and Medication management  Principal Problem:   Alcohol-induced mood disorder with depressive symptoms (HCC)     [name] is a 66 y.o. male with SIMD, SIPD, AUD, opioid use d/o, stimulant use d/o (cocaine), compensated cirrhosis w thrmbocytopenia, chronic IDA, chronic hep C, HTN, past suicide attempt who presented to [facility] as a walkin, then admitted to [facility] (07/06/2022) for assistance with residential placement. Presented 3x in Aug 2023, and 16x since Jan 2023. Last time [facility] 06/12/2022, [facility] due being asked to contribute to workforce. Admission BAL neg, UDS neg Total duration of encounter: 1 day  Home Rx: amlodipine 10 mg daily, Abilify 15 mg, Seroquel 200 mg, gabapentin 300 mg TID  AUD, severe (contemplative stage) Drinks 40oz beers x5-6 daily  with additional 4 L of wine x3 days. Last drink 9/20 at ~2PM. Denied h/o seizures and DT.  BAL neg (9/20), AST/ALT wnl.  CIWA with librium PRN per protocol with thiamine & MV supplement Plan to discuss starting naltrexone prior to dc Would start 50 mg around 72hrs after last drink if patient amenable and AST/ALT are within 2-3x ULN CONTINUED home gabapentin 300 mg 3 times daily  SIMD  h/o SIPD DISCONTINUED home Seroquel 200 mg daily Possibly received abilify LAI on 06/28/2022, unclear-pending to confirm with ACTT Received Abilify 10 mg x1 prior to learning patient may be on LAI  Compensated cirrhosis  h/o hepatitis C  Chronic thrombocytopenia AST/ALT wnl. PLT 87, in past 3 months -lowest 70s & highest 230s & avg 140s.  Patient  reported treated HCV in the past, but will confirm with HCV quant Ordered LFTs, INR/PT, HCV RNA quant for 9/21 Patient has PCP, next appointment 07/11/2022  OUD, severe (precontemplative)  Opioid withdrawal Declined HIV/RPR screening. No hepatitis screen, given history of hepatitis COWS with clonidine and comfort PRNs per protocol Plan to discuss naltrexone prior to dc  Tobacco use d/o (precontemplative stage) NRT Encouraged cessation  STABLE HTN Continued home amlodipine 10 mg daily Chronic IDA, improving Continued home iron supplement  Considerations for follow-up: Recommend high risk screening every 6-12 months with HIV, RPR, hepatitis panel Recommend monitoring LFT every 6-12 months while on naltrexone  DISPO: IS amenable to residential rehab. Will attempt to dc patient door-to-door, however if unable will discuss with [facility] as bridge to rehab.  Tentative date: TBA Location: TBA   Signed: Princess Bruins, DO Psychiatry Resident, PGY-2 07/07/2022, 3:16 PM   [facility] [address] [place], [place] [address] Dept: [phone] Dept Fax: [phone]

## 2022-07-07 NOTE — ED Notes (Signed)
Snacks given 

## 2022-07-07 NOTE — ED Notes (Signed)
In a group meeting 

## 2022-07-07 NOTE — ED Notes (Signed)
Pt asleep in bed. Respirations even and unlabored. Monitoring for safety. 

## 2022-07-07 NOTE — ED Notes (Signed)
Patient awake and alert on unit.  He ate breakfast and asked for medication which he was given.  Patient without somatic distress or complaint.  No evidence of withdrawal symptoms.  Patient has irritable affect and sad mood.  Will monitor.

## 2022-07-07 NOTE — ED Notes (Signed)
Pt requested for medication to help him sleep.

## 2022-07-07 NOTE — BH IP Treatment Plan (Signed)
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[place])  Alcohol abuse with withdrawal ([place])     Current Medications:  Current Facility-Administered Medications  Medication Dose Route Frequency Provider Last Rate Last Admin   acetaminophen (TYLENOL) tablet 650 mg  650 mg Oral Q6H PRN Rankin, Shuvon B, NP       alum & mag hydroxide-simeth (MAALOX/MYLANTA) 200-200-20 MG/5ML suspension 30 mL  30 mL Oral Q4H PRN Rankin, Shuvon B, NP       amLODipine (NORVASC) tablet 10 mg  10 mg Oral Daily Rankin, Shuvon B, NP   10 mg at 07/07/22 0948   chlordiazePOXIDE (LIBRIUM) capsule 25 mg  25 mg Oral Q6H PRN Rankin, Shuvon B, NP       ferrous sulfate tablet 325 mg  325 mg Oral Q breakfast Rankin, Shuvon B, NP   325 mg at 07/07/22 0948   gabapentin (NEURONTIN) capsule 300 mg  300 mg Oral TID Rankin, Shuvon B, NP   300 mg at 07/07/22 0948   hydrOXYzine (ATARAX) tablet 25 mg  25 mg Oral Q6H PRN Rankin, Shuvon B, NP       loperamide (IMODIUM) capsule 2-4 mg  2-4 mg Oral PRN Rankin, Shuvon B, NP       magnesium hydroxide (MILK OF MAGNESIA) suspension 30 mL  30 mL Oral Daily PRN Rankin, Shuvon B, NP       multivitamin with minerals tablet 1 tablet  1 tablet Oral Daily Rankin, Shuvon B, NP   1 tablet at 07/07/22 0948   ondansetron (ZOFRAN-ODT) disintegrating tablet 4 mg  4 mg Oral Q6H PRN Rankin, Shuvon B, NP   4 mg at 07/06/22 1810   thiamine (VITAMIN B1) tablet 100 mg  100 mg Oral Daily Rankin, Shuvon B, NP   100 mg at 07/07/22 0948   Current Outpatient Medications  Medication Sig Dispense Refill   amLODipine (NORVASC) 10 MG tablet Take 1 tablet (10 mg total) by mouth daily. 30 tablet 0   amoxicillin (AMOXIL) 500 MG capsule Take 500 mg by mouth every 8 (eight) hours. Take for 7 days starting on 06/30/22.     chlorhexidine (PERIDEX) 0.12 % solution Use  as directed 15 mLs in the mouth or throat 2 (two) times daily as needed (For oral hygiene after teeth removal).     ferrous sulfate 325 (65 FE) MG tablet Take 1 tablet (325 mg total) by mouth daily with breakfast. 30 tablet 0   gabapentin (NEURONTIN) 300 MG capsule Take 1 capsule (300 mg total) by mouth 3 (three) times daily. 90 capsule 0   hydrOXYzine (VISTARIL) 25 MG capsule Take 25 mg by mouth 2 (two) times daily as needed (For anxiety or sleep).     methylPREDNISolone (MEDROL DOSEPAK) 4 MG TBPK tablet Take 4-24 mg by mouth See admin instructions. Take 6 tablets on day 1, 5 tablets on day 2, 4 tablets on day 3, 3 tablets on day 4, 2 tablets on day 2 and 1 tablet on day 6 starting on 06/30/22.     QUEtiapine (SEROQUEL) 200 MG tablet Take 1 tablet (200 mg total) by mouth at bedtime. 30 tablet 0   PTA Medications: Prior to Admission medications   Medication Sig Start Date End Date Taking? Authorizing Provider  amLODipine (NORVASC) 10 MG tablet Take 1 tablet (10 mg total) by mouth daily. 06/15/22 07/15/22 Yes France Ravens,  MD  amoxicillin (AMOXIL) 500 MG capsule Take 500 mg by mouth every 8 (eight) hours. Take for 7 days starting on 06/30/22. 06/30/22  Yes [provider]  chlorhexidine (PERIDEX) 0.12 % solution Use as directed 15 mLs in the mouth or throat 2 (two) times daily as needed (For oral hygiene after teeth removal). 06/30/22  Yes [provider]  ferrous sulfate 325 (65 FE) MG tablet Take 1 tablet (325 mg total) by mouth daily with breakfast. 06/14/22 07/14/22 Yes France Ravens, MD  gabapentin (NEURONTIN) 300 MG capsule Take 1 capsule (300 mg total) by mouth 3 (three) times daily. 06/14/22 07/14/22 Yes France Ravens, MD  hydrOXYzine (VISTARIL) 25 MG capsule Take 25 mg by mouth 2 (two) times daily as needed (For anxiety or sleep). 06/28/22  Yes [provider]  methylPREDNISolone (MEDROL DOSEPAK) 4 MG TBPK tablet Take 4-24 mg by mouth See admin instructions. Take 6 tablets on day 1, 5  tablets on day 2, 4 tablets on day 3, 3 tablets on day 4, 2 tablets on day 2 and 1 tablet on day 6 starting on 06/30/22. 06/30/22  Yes [provider]  QUEtiapine (SEROQUEL) 200 MG tablet Take 1 tablet (200 mg total) by mouth at bedtime. 06/14/22 07/14/22 Yes France Ravens, MD    Patient Stressors: Financial difficulties   Health problems   Loss of housing - has been homeless since he was released from prison in 2022.   Substance abuse    Patient Strengths: Marketing executive fund of knowledge   Treatment Modalities: Medication Management, Group therapy, Case management,  1 to 1 session with clinician, Psychoeducation, Recreational therapy.   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 Patient will attend at least of 50% of the groups daily. Pt will complete the PHQ9 on admission, day 3 and discharge. Patient will participate in completing the [place] Patient will score a low risk of violence for 24 hours prior to discharge Patient will take medications as prescribed daily.  Medication Management: Evaluate patient's response, side effects, and tolerance of medication regimen.  Therapeutic Interventions: 1 to 1 sessions, Unit Group sessions and Medication administration.  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  Short Term Goals: Facilitate acceptance of mental health diagnosis and concerns through  verbal commitment to aftercare plan and appointments at discharge., Patient will identify one social support prior to discharge to aid in patient's recovery., Patient will attend AA/NA groups as scheduled., Identify minimum of 2 triggers associated with mental health/substance abuse issues with treatment team members., and Increase skills for wellness and recovery by attending 50% of scheduled groups.  Therapeutic Interventions: Assess for all discharge needs, 1 to 1 time with Education officer, museum, Explore available resources and support systems, Assess for adequacy in community support network, Educate family and significant other(s) on suicide prevention, Complete Psychosocial Assessment, Interpersonal group therapy.  Evaluation of Outcomes: Not Met   Progress in Treatment: Attending groups: No. Participating in groups: No. Taking medication as prescribed: Yes.  Toleration medication: Yes. Family/Significant other contact made: No, will contact:  Attempted to contact [place], [place], [phone].  Had to leave a message. Patient understands diagnosis: Yes. Discussing patient identified problems/goals with staff: Yes. Medical problems stabilized or resolved: No. Denies suicidal/homicidal ideation:  Passive SI but with zero intent or plan other than to continue drinking and let his liver fail.  Zero HI. Issues/concerns per patient self-inventory: Yes. Other: Irritated that he is being encouraged to make phone calls to providers for availability in their residential treatment program and wanting everyone else to do these things for him rather than take initiative.  New problem(s) identified: Yes, Describe:  Pt believes the  cirrhosis is worsening.  New Short Term/Long Term Goal(s): "Put my life back together."   Patient Goals:  "Quit drinking; save money; find a place to live."  Pt's ultimate goal is to get into a long-term facility.  Discharge Plan or Barriers:  Minimal initiative; homeless -  living in shelter until November 2023, per pt report.  Reason for Continuation of Hospitalization: Medical Issues Medication stabilization Withdrawal symptoms  Estimated Length of Stay:  Last 3 [place] Suicide Severity Risk Score: [place] ED from 07/06/2022 in [facility] ED from 06/10/2022 in [facility] ED from 05/29/2022 in [place] No Risk Moderate Risk No Risk       Last PHQ 2/9 Scores:    07/07/2022    9:26 AM 07/06/2022    4:09 PM 06/11/2022   10:21 AM  Depression screen PHQ 2/9  Decreased Interest 3 2 3   Down, Depressed, Hopeless 3 3 3   PHQ - 2 Score 6 5 6   Altered sleeping 3 3 3   Tired, decreased energy 3 3   Change in appetite 3 3 3   Feeling bad or failure about yourself  3 3 3   Trouble concentrating 3 3 3   Moving slowly or fidgety/restless 0 0 0  Suicidal thoughts 2 3 3   PHQ-9 Score 23 23 21   Difficult doing work/chores Extremely dIfficult Very difficult Very difficult    Scribe for Treatment Team: Kerri Perches, LCSW 07/07/2022 3:09 PM

## 2022-07-08 ENCOUNTER — Telehealth (HOSPITAL_COMMUNITY): Payer: Self-pay

## 2022-07-08 ENCOUNTER — Encounter (HOSPITAL_COMMUNITY): Payer: Self-pay | Admitting: Registered Nurse

## 2022-07-08 ENCOUNTER — Encounter (HOSPITAL_COMMUNITY): Payer: Self-pay | Admitting: Emergency Medicine

## 2022-07-08 ENCOUNTER — Other Ambulatory Visit: Payer: Self-pay

## 2022-07-08 ENCOUNTER — Emergency Department (HOSPITAL_COMMUNITY)
Admission: EM | Admit: 2022-07-08 | Discharge: 2022-07-09 | Disposition: A | Discharge status: Home / Self Care | Payer: Medicare Other | Attending: Emergency Medicine | Admitting: Emergency Medicine

## 2022-07-08 DIAGNOSIS — I10 Essential (primary) hypertension: Secondary | ICD-10-CM | POA: Diagnosis not present

## 2022-07-08 DIAGNOSIS — Z79899 Other long term (current) drug therapy: Secondary | ICD-10-CM | POA: Insufficient documentation

## 2022-07-08 DIAGNOSIS — R109 Unspecified abdominal pain: Secondary | ICD-10-CM | POA: Diagnosis present

## 2022-07-08 DIAGNOSIS — A09 Infectious gastroenteritis and colitis, unspecified: Secondary | ICD-10-CM

## 2022-07-08 LAB — COMPREHENSIVE METABOLIC PANEL
ALT: 17 U/L (ref 0–44)
AST: 21 U/L (ref 15–41)
Albumin: 3.2 g/dL — ABNORMAL LOW (ref 3.5–5.0)
Alkaline Phosphatase: 58 U/L (ref 38–126)
Anion gap: 9 (ref 5–15)
BUN: 9 mg/dL (ref 8–23)
CO2: 24 mmol/L (ref 22–32)
Calcium: 8.9 mg/dL (ref 8.9–10.3)
Chloride: 106 mmol/L (ref 98–111)
Creatinine, Ser: 0.98 mg/dL (ref 0.61–1.24)
GFR, Estimated: >60 mL/min (ref >60)
Glucose, Bld: 93 mg/dL (ref 70–99)
Potassium: 3.9 mmol/L (ref 3.5–5.1)
Sodium: 139 mmol/L (ref 135–145)
Total Bilirubin: 0.8 mg/dL (ref 0.3–1.2)
Total Protein: 7.5 g/dL (ref 6.5–8.1)

## 2022-07-08 LAB — URINALYSIS, ROUTINE W REFLEX MICROSCOPIC
Bilirubin Urine: NEGATIVE
Glucose, UA: NEGATIVE mg/dL
Hgb urine dipstick: NEGATIVE
Ketones, ur: NEGATIVE mg/dL
Leukocytes,Ua: NEGATIVE
Nitrite: NEGATIVE
Protein, ur: NEGATIVE mg/dL
Specific Gravity, Urine: 1.025 (ref 1.005–1.030)
pH: 5.5 (ref 5.0–8.0)

## 2022-07-08 LAB — CBC WITH DIFFERENTIAL/PLATELET
Abs Immature Granulocytes: 0.03 10*3/uL (ref 0.00–0.07)
Basophils Absolute: 0.1 10*3/uL (ref 0.0–0.1)
Basophils Relative: 1 %
Eosinophils Absolute: 0.4 10*3/uL (ref 0.0–0.5)
Eosinophils Relative: 4 %
HCT: 37.6 % — ABNORMAL LOW (ref 39.0–52.0)
Hemoglobin: 11.4 g/dL — ABNORMAL LOW (ref 13.0–17.0)
Immature Granulocytes: 0 %
Lymphocytes Relative: 26 %
Lymphs Abs: 2.5 10*3/uL (ref 0.7–4.0)
MCH: 22.4 pg — ABNORMAL LOW (ref 26.0–34.0)
MCHC: 30.3 g/dL (ref 30.0–36.0)
MCV: 74 fL — ABNORMAL LOW (ref 80.0–100.0)
Monocytes Absolute: 1.1 10*3/uL — ABNORMAL HIGH (ref 0.1–1.0)
Monocytes Relative: 11 %
Neutro Abs: 5.7 10*3/uL (ref 1.7–7.7)
Neutrophils Relative %: 58 %
Platelets: 61 10*3/uL — ABNORMAL LOW (ref 150–400)
RBC: 5.08 MIL/uL (ref 4.22–5.81)
RDW: 27.9 % — ABNORMAL HIGH (ref 11.5–15.5)
WBC: 9.8 10*3/uL (ref 4.0–10.5)
nRBC: 0 % (ref 0.0–0.2)

## 2022-07-08 LAB — RAPID URINE DRUG SCREEN, HOSP PERFORMED
Amphetamines: NOT DETECTED
Barbiturates: NOT DETECTED
Benzodiazepines: NOT DETECTED
Cocaine: NOT DETECTED
Opiates: NOT DETECTED
Tetrahydrocannabinol: NOT DETECTED

## 2022-07-08 LAB — HCV RNA QUANT: HCV Quantitative: NOT DETECTED IU/mL (ref >50)

## 2022-07-08 LAB — ETHANOL: Alcohol, Ethyl (B): <10 mg/dL (ref <10)

## 2022-07-08 LAB — LIPASE, BLOOD: Lipase: 149 U/L — ABNORMAL HIGH (ref 11–51)

## 2022-07-08 LAB — PROLACTIN: Prolactin: 15.3 ng/mL — ABNORMAL HIGH (ref 4.0–15.2)

## 2022-07-08 MED ORDER — ADULT MULTIVITAMIN W/MINERALS CH: 1.0000 | ORAL_TABLET | Freq: Every day | ORAL | Status: DC

## 2022-07-08 MED ORDER — GABAPENTIN 300 MG PO CAPS: 300.0000 mg | ORAL_CAPSULE | Freq: Two times a day (BID) | ORAL | Status: DC

## 2022-07-08 MED ORDER — VITAMIN B-1 100 MG PO TABS: 100.0000 mg | ORAL_TABLET | Freq: Every day | ORAL | Status: DC

## 2022-07-08 MED ORDER — GABAPENTIN 300 MG PO CAPS: 300.0000 mg | ORAL_CAPSULE | Freq: Two times a day (BID) | ORAL | 0 refills | Status: DC

## 2022-07-08 NOTE — ED Notes (Signed)
Pt eating lunch in no acute distress. Denies concerns at present. Encouraged pt to notify staff with any needs or concerns. Will continue to monitor for safety.
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
## 2022-07-08 NOTE — ED Notes (Signed)
Pt provided breakfast this morning.  He reports stomach discomfort with loose stools.  PRN medication provided to assist with loose stools.  Pt reports history of abdominal issues.  He denies SI, HI, and AVH.  Reports fair sleep overnight.  Breathing is even and unlabored.  Will continue to monitor for safety.

## 2022-07-08 NOTE — BH Specialist Note (Addendum)
LCSW Progress Note   LCSW received a call back yesterday from [name], the Lobbyist at [facility] to discuss the pt's case and possible placement for aftercare.  LCSW informed her of possible placements and how his much needed medical appointments would make it difficult for facilitators to adjust the daily program schedule to get him to those appointments.  Pt has two appointments coming up -  11 July 2022 with Dr. Archie Patten, and 27 September with [facility].  LCSW informed Ms. Rollene Fare that a call can be placed with Lenor Coffin to discuss transportation coordination with his ACTT providers so that he may attend the residential rehab program with them but still make his appointments.    LCSW spoke with pt this morning regarding [place], [phone] - no answer.  [place], [id number], @ [facility] to discuss a possible resolution for transporting him to appointments utilizing his ACTT provider.  Sheena informed the LCSW that there would be no room in the schedule for him to attend his appointments.  [phone] - Contacted Cherlyn Cushing, at the [facility] to inquire about vacancies as a Administrator, Civil Service.  Mr. Bobbye Charleston confirmed that there are vacancies and for the pt to call him when he is awake.  LCSW also contacted another [employer] on [address] to confirm vacancies.  Railroad again at [facility] to inquire about pt coming to the program and was informed that he could continue with the intake process and see what the pastor says. LCSW was informed at 1529 that he was accepted and would be picked up on Sunday, 10 July 2022 between 1300-1400.  1611 - LCSW learned pt is discharging and because he does not like the plan to go to [facility].  Resources for other outpatient and residential rehab facilities were provided in his AVS.  Instructions  to contact [facility] to complete the intake process that had been started on his behalf were included as well.   Omelia Blackwater, MSW, [place] phone [phone] work mobile.

## 2022-07-08 NOTE — ED Notes (Signed)
Snacks given 

## 2022-07-08 NOTE — BH Assessment (Signed)
Care Management - [place] Follow Up Discharges   Writer attempted to make contact with patient today and was unsuccessful.  The phone number listed is not a working number.   Per chart review, patient was provided with outpatient resources.

## 2022-07-08 NOTE — ED Triage Notes (Signed)
Patient arrived with PTAR reports pain across lower abdomen with diarrhea onset last week , no fever or diarrhea .

## 2022-07-08 NOTE — ED Notes (Signed)
Pt is in the bed sleeping. Respirations are even and unlabored. No acute distress noted. Will continue to monitor for safety. 

## 2022-07-08 NOTE — ED Notes (Signed)
Pt discharged agitated and threatening to call 911 to take him to the ED. Pt states, "If I go there, that will buy me some time for them to get me in a 10 day program. They did it before. These women doctors don't know shit. I'm not going anywhere where I have to work to stay. I've done my time with that in prison. I know the system better than you". Pt was given the AVS and his belongings. Pt escorted to lobby with bus pass in hand. Pt got to lobby and said, "I'm getting ready to call EMS right now (laughing). MD made aware. Safety maintained. Pt denies SI/HI/AVH and ETOH withdrawal sx upon discharge.

## 2022-07-08 NOTE — Discharge Instructions (Signed)
Base on the information you have provided and the presenting issue, outpatient services with therapy and psychiatry have been recommended.  It is imperative that you follow through with treatment recommendations within 5-7 days from the of discharge to mitigate further risk to your safety and mental well-being. A list of referrals has been provided below to get you started.  You are not limited to the list provided.  In case of an urgent crisis, you may contact the Mobile Crisis Unit with [facility] at [phone].  Outpatient Therapy and Psychiatry for Medicare Recipients  [place] [address] [place], [place], [address] [phone] phone  [place] [address] [place], [place], [address] [phone] phone ([address] [place], [place], [place], [place], [employer], [place], [place], [place], [place], [place], [place], [place], [place], [place], [employer], [place])  [facility] [address] [place], [place], [address] [phone] phone  [facility] [address], [place], [place], [address] [phone] phone  [place] [place], [place], [address] [phone] phone [phone] fax  [facility], [place] [address], [place], [address] [phone] phone  [facility] [address] [place] [place], [place], [address] [phone] phone [phone] fax  [place] [address] [place], [place], [address] [phone] phone  Jinny Blossom [address] [place], [place], [address] [phone] phone  [employer] [place] [employer]. [place], [place], [address] [phone] phone [phone] fax   [place]  (women only at this campus) [place], [place] [address] [phone]  [facility] [place] (men only at this campus) [place], [place] [address] [phone]  ((These programs listed above have a one-time application fee.))   [place] [address], [place] [address] [phone]  [facility] [address] [place] [address] [phone] II Charna Busman [address], [place] [address] [phone], [place] [address] [phone]  [facility] (Rehab for men only) [address] [place], [place] [address] [phone]   RESIDENTIAL TREATMENT- PRIVATE PAY:  [facility] [place], [place] [phone] Women's [place], [place] [phone]  MEDICAL DETOX/RESIDENTIAL TREATMENT- MEDICAID/IPRS:  [facility] [address] [place], [place] [address] [phone]  [place] [address] [place], [place] [address] [phone]  [place] [place], [place] [address] [phone]  [place] [address] [place], [place] [address] [phone]  [place], [place] [address] [phone]  ((For admissions to these three [facility] facilities during weekday days and possibly other times, contact [facility], phone: [phone]; fax: [phone]))  [facility] (detox for men and women) [place], [place] [address] [phone]  MEDICAL DETOX AND RESIDENTIAL TREATMENT - INSURANCE:  [facility] (also offers CD-IOP for graduates of residential program) [place]. [place], [place] [address] [phone]  [facility] (now accepting [place]) [place] [address] [phone]  [facility] (accept  [employer] for some services)  [address] [place], [place] [address] [phone]  OUTPATIENT PROGRAMS:  [facility] (ADS) [address], [place] [address] [phone]  ((CD-IOP is currently not operational; Opioid replacement clinic is operational))   [facility] (private insurance) [address] [place], [place] [address] [phone]  ((CD-IOP (afternoon program); individual therapy))   [facility] (Medicaid & IPRS for [facility] residents) [address] [place], [place] [address] [phone]  ((CD-IOP; new clients must go through walk-in clinic; NOT CURRENTLY OPERATIONAL))   [facility] (Medicaid & IPRS for [facility] residents) [address] [place], [place] [address] [phone]  ((CD-IOP))   [facility] (Medicaid for [employer] and [employer]; some private insurance) [address] [place], [place] [address] [phone]  ((CD-IOP))   [facility] & private insurance) [phone] [address] [place], [place] [address] [phone]  ((CD-IOP (morning & evening programs); individual therapy))   HALFWAY HOUSES:  [facility] [phone]  [email or web address]   OPIOID REPLACEMENT PROVIDERS:  [facility] ([facility]) [address], [place] [address] [phone]  [facility] [address] [place], [place] [address] [phone]  [facility] [address] [place], [place] [address] [phone]   12 STEP PROGRAMS:  Alcoholics Anonymous of [place] [employer]  Narcotics Anonymous of [place] [employer]dk  Al-Anon of  [place], [place] [email or web address]/find-meetings.html  Nar-Anon https://nar-anon.org/find-[email or web address]-meeting

## 2022-07-08 NOTE — ED Provider Triage Note (Signed)
Emergency Medicine Provider Triage Evaluation Note  [name] , a 66 y.o. male  was evaluated in triage.  Pt complains of suprapubic abdominal pain x 1 week. Has associated diarrhea. No meds tried. Notes that he consumes 6 40 oz beers daily with his last drink being two days ago. Pt was at [facility] for the past 2 days and was just released 2 hours ago. Denies nausea, vomiting, urinary symptoms, fever.   Review of Systems  Positive:  Negative:   Physical Exam  BP (!) 158/72 (BP Location: Left Arm)   Pulse 65   Temp 98.6 F (37 C) (Oral)   Resp 16   SpO2 97%  Gen:   Awake, no distress   Resp:  Normal effort  MSK:   Moves extremities without difficulty  Other:  Mild abdominal TTP noted to suprapubic region. Two hernias noted to ventral region without TTP.   Medical Decision Making  Medically screening exam initiated at 7:37 PM.  Appropriate orders placed.  [name] was informed that the remainder of the evaluation will be completed by another provider, this initial triage assessment does not replace that evaluation, and the importance of remaining in the ED until their evaluation is complete.  Work-up initiated.    Aivah Putman A, PA-C 07/08/22 1937

## 2022-07-09 ENCOUNTER — Emergency Department (HOSPITAL_COMMUNITY): Payer: Medicare Other

## 2022-07-09 DIAGNOSIS — A09 Infectious gastroenteritis and colitis, unspecified: Secondary | ICD-10-CM | POA: Diagnosis not present

## 2022-07-09 MED ORDER — IOHEXOL 350 MG/ML SOLN
75.0000 mL | Freq: Once | INTRAVENOUS | Status: AC | PRN
  Administered 2022-07-09: 75 mL via INTRAVENOUS

## 2022-07-09 MED ORDER — DICYCLOMINE HCL 20 MG PO TABS: 20.0000 mg | ORAL_TABLET | Freq: Two times a day (BID) | ORAL | 0 refills | Status: DC | PRN

## 2022-07-09 MED ORDER — CIPROFLOXACIN HCL 500 MG PO TABS: 500.0000 mg | ORAL_TABLET | Freq: Two times a day (BID) | ORAL | 0 refills | Status: DC

## 2022-07-09 MED ORDER — ONDANSETRON HCL 4 MG/2ML IJ SOLN
4.0000 mg | Freq: Once | INTRAMUSCULAR | Status: AC
  Administered 2022-07-09: 4 mg via INTRAVENOUS
  Filled 2022-07-09: qty 2

## 2022-07-09 MED ORDER — MORPHINE SULFATE (PF) 4 MG/ML IV SOLN
4.0000 mg | Freq: Once | INTRAVENOUS | Status: AC
  Administered 2022-07-09: 4 mg via INTRAVENOUS
  Filled 2022-07-09: qty 1

## 2022-07-09 MED ORDER — CIPROFLOXACIN HCL 500 MG PO TABS: 500.0000 mg | ORAL_TABLET | Freq: Two times a day (BID) | ORAL | 0 refills | Status: AC

## 2022-07-09 MED ORDER — METRONIDAZOLE 500 MG PO TABS
500.0000 mg | ORAL_TABLET | Freq: Once | ORAL | Status: AC
  Administered 2022-07-09: 500 mg via ORAL
  Filled 2022-07-09: qty 1

## 2022-07-09 MED ORDER — SODIUM CHLORIDE 0.9 % IV BOLUS
500.0000 mL | Freq: Once | INTRAVENOUS | Status: AC
  Administered 2022-07-09: 500 mL via INTRAVENOUS

## 2022-07-09 MED ORDER — CIPROFLOXACIN HCL 500 MG PO TABS
500.0000 mg | ORAL_TABLET | Freq: Once | ORAL | Status: AC
  Administered 2022-07-09: 500 mg via ORAL
  Filled 2022-07-09: qty 1

## 2022-07-09 MED ORDER — METRONIDAZOLE 500 MG PO TABS: 500.0000 mg | ORAL_TABLET | Freq: Three times a day (TID) | ORAL | 0 refills | Status: DC

## 2022-07-09 NOTE — Discharge Instructions (Addendum)
You were seen in the emergency department for abdominal pain.  Your labs overall look similar to prior labs you have had done including your low hemoglobin and platelet counts.  Your lipase which was your pancreas was mildly elevated however your pancreas looked okay on your CT scan.  Your CT scan showed findings of colitis or inflammation throughout the colon/large intestine.  We are treating this as an infectious colitis with antibiotics.  We are sending you home with ciprofloxacin and Flagyl to treat the infectious colitis.  Please take these as prescribed.  Ciprofloxacin can put you at increased risk for tendon injury therefore you should not participate in quick movements/motions.  Flagyl can make you extremely sick if you take it with alcohol, do not drink while taking this medication.  We are also sending home with Bentyl to take as needed for abdominal cramping.  We have prescribed you new medication(s) today. Discuss the medications prescribed today with your pharmacist as they can have adverse effects and interactions with your other medicines including over the counter and prescribed medications. Seek medical evaluation if you start to experience new or abnormal symptoms after taking one of these medicines, seek care immediately if you start to experience difficulty breathing, feeling of your throat closing, facial swelling, or rash as these could be indications of a more serious allergic reaction  Please follow-up with GI, it is important that you have a repeat colonoscopy given these colitis findings to ensure that she did not have any type of underlying cancerous process.  We have sent a referral to GI.  Return to the emergency department for any new or worsening symptoms including but not limited to new or worsening pain, inability to keep fluids down, blood in your stool, passing out, fever, or any other concerns.

## 2022-07-09 NOTE — ED Notes (Signed)
Patient unable to give stool sample, will retry at later time.
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
## 2022-07-11 ENCOUNTER — Inpatient Hospital Stay: Payer: Medicaid Other | Admitting: Nurse Practitioner

## 2022-12-17 IMAGING — CT CT ABD-PELV W/ CM
3 of 5 series · 16 of 46 positions shown, 18 images · IV contrast (omnipaque)
Comparison: 03/17/2012

CLINICAL DATA: Abdominal pain, cirrhosis

EXAM:
CT ABDOMEN AND PELVIS WITH CONTRAST
TECHNIQUE: Multidetector CT imaging of the abdomen and pelvis was performed
using the standard protocol following bolus administration of
intravenous contrast.
CONTRAST:  100mL OMNIPAQUE IOHEXOL 300 MG/ML  SOLN

[Series 2: axial st · axial · 0.90mm/px · z∈[+1091,+1526]mm · 12 of 103 slices shown, 14 images]
[im 8/103  soft-tissue]
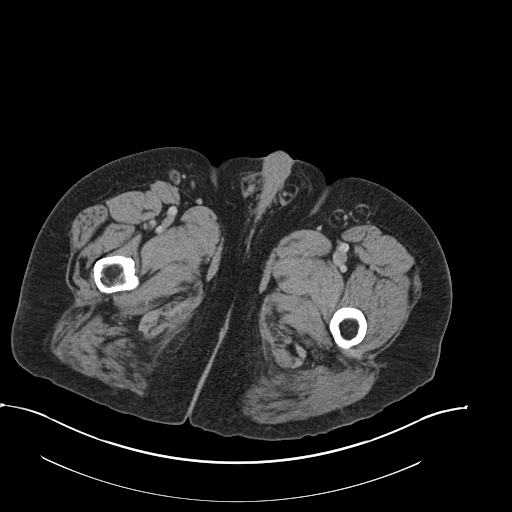
[im 8/103  bone]
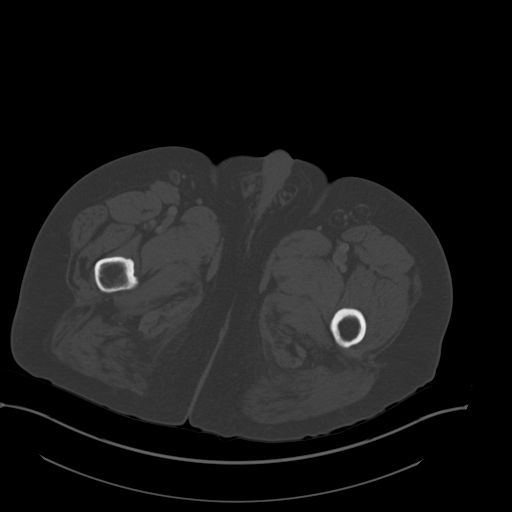
[im 16/103  soft-tissue]
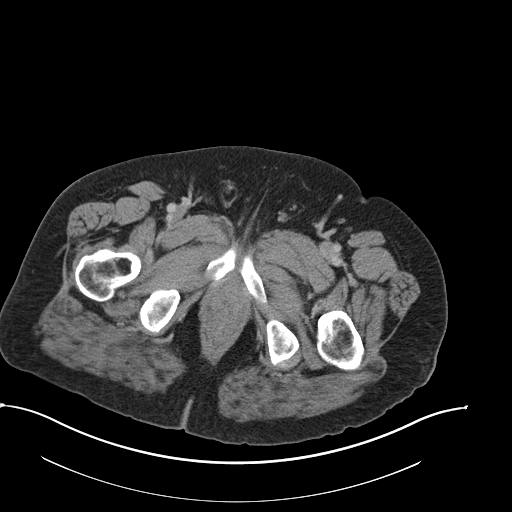
[im 24/103  soft-tissue]
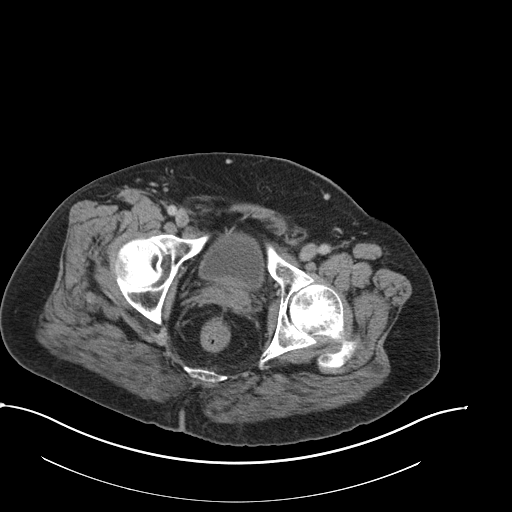
[im 32/103  soft-tissue]
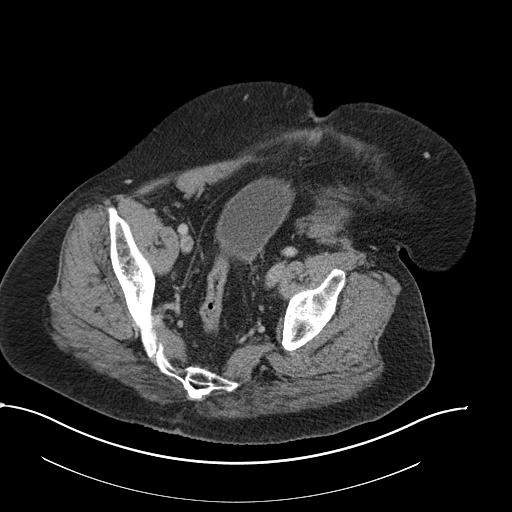
[im 40/103  soft-tissue]
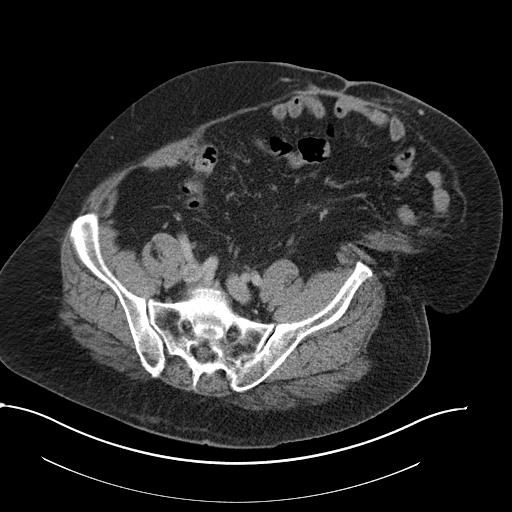
[im 48/103  soft-tissue]
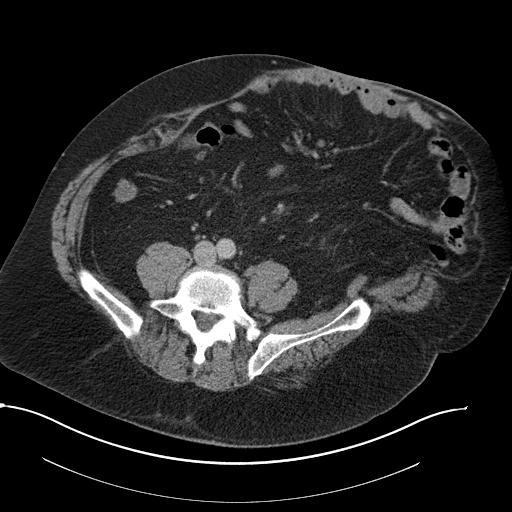
[im 55/103  soft-tissue]
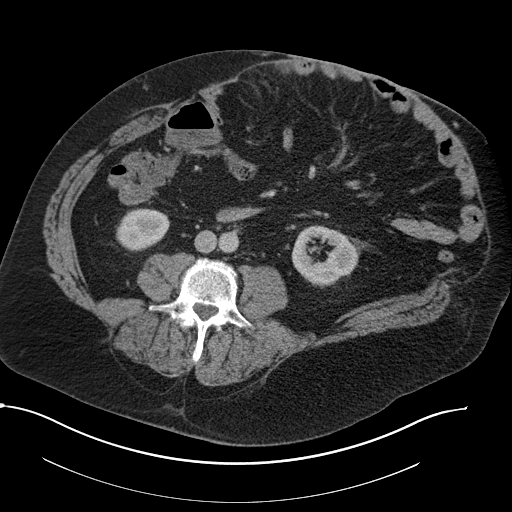
[im 63/103  soft-tissue]
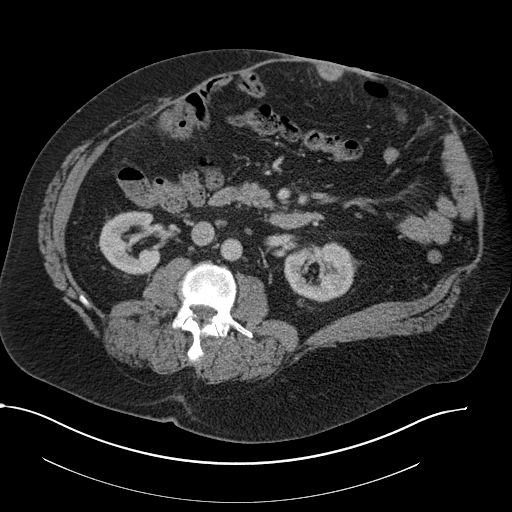
[im 71/103  soft-tissue]
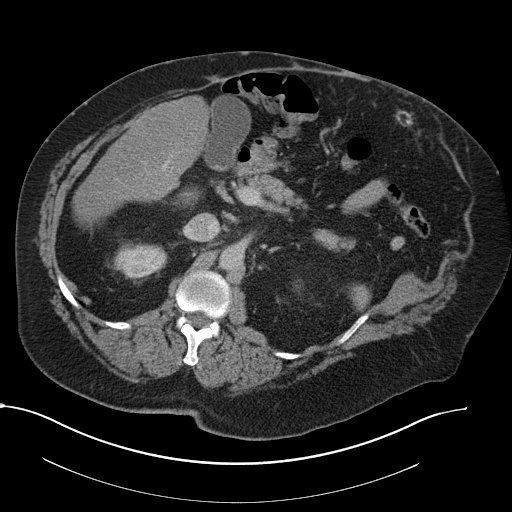
[im 71/103  bone]
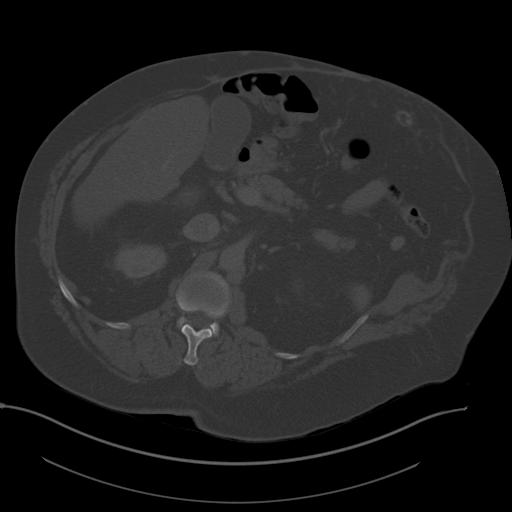
[im 79/103  soft-tissue]
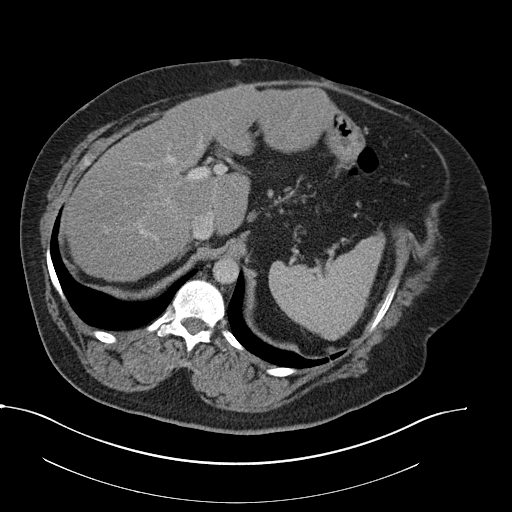
[im 87/103  soft-tissue]
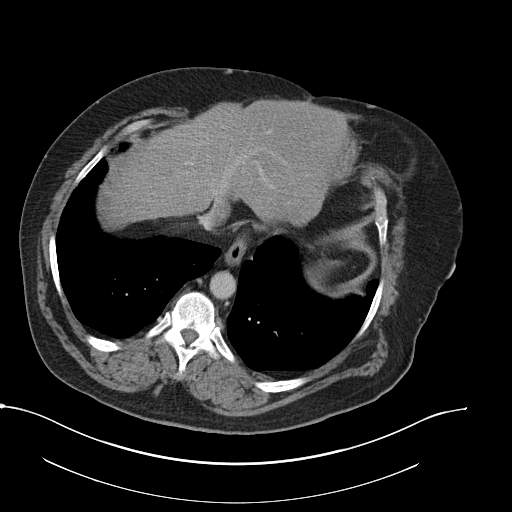
[im 95/103  soft-tissue]
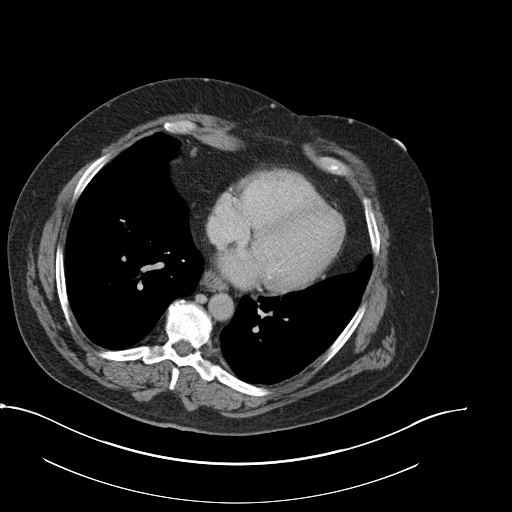

[Series 4: lung bases · axial · 0.90mm/px · 1 of 100 slices shown]
[im 9/100  bone]
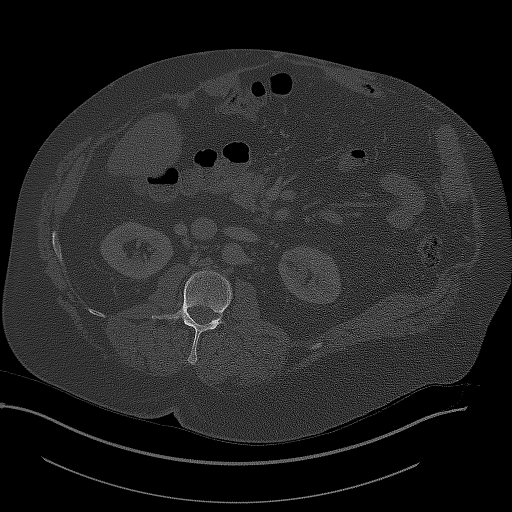

[Series 5: coronal st · coronal · 0.91mm/px · 3 of 184 slices shown]
[im 62/184  soft-tissue]
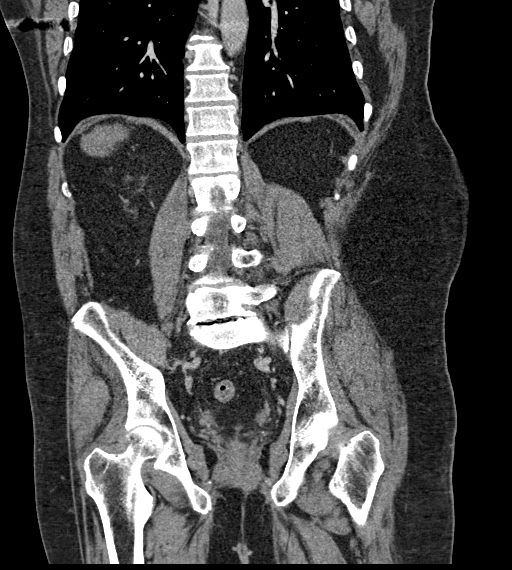
[im 82/184  soft-tissue]
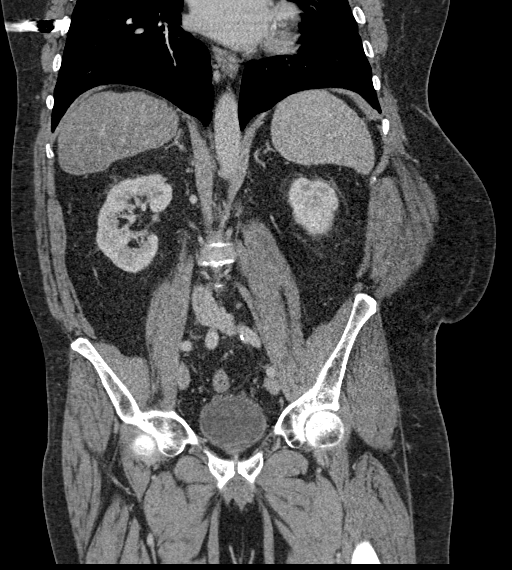
[im 102/184  soft-tissue]
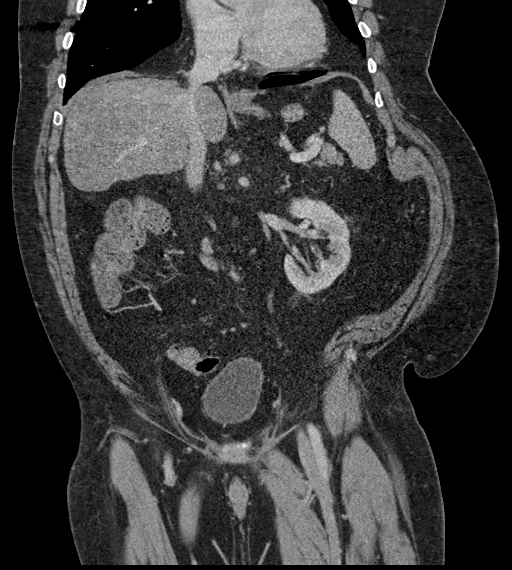

[16 of 46 positions shown; findings below may reference images not displayed]

FINDINGS: Lower chest: No acute pleural or parenchymal lung disease.

Hepatobiliary: Diffuse hepatic steatosis. Slight nodular contour of
the liver consistent with history of cirrhosis. No focal liver
abnormalities. Small calcified gallstones without cholecystitis.

Pancreas: Unremarkable. No pancreatic ductal dilatation or
surrounding inflammatory changes.

Spleen: Normal in size without focal abnormality.

Adrenals/Urinary Tract: Adrenal glands are unremarkable. Kidneys are
normal, without renal calculi, focal lesion, or hydronephrosis.
Bladder is unremarkable.

Stomach/Bowel: No bowel obstruction or ileus. No bowel wall
thickening or inflammatory change.

Vascular/Lymphatic: Aortic atherosclerosis. No enlarged abdominal or
pelvic lymph nodes.

Reproductive: Prostate is unremarkable.

Other: No free fluid or free gas. There is wide diastasis of the
rectus musculature which has developed in the interim since prior
study. Postsurgical changes from previous midline laparotomy.

Musculoskeletal: No acute or destructive bony lesions. Reconstructed
images demonstrate no additional findings.
IMPRESSION: 1. Cholelithiasis without cholecystitis.
2. Cirrhosis, with diffuse hepatic steatosis.
3. Aortic Atherosclerosis ([id number]).
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
## 2023-01-09 ENCOUNTER — Encounter (HOSPITAL_COMMUNITY): Payer: Self-pay

## 2023-01-09 ENCOUNTER — Emergency Department (HOSPITAL_COMMUNITY)
Admission: EM | Admit: 2023-01-09 | Discharge: 2023-01-09 | Disposition: A | Discharge status: Other Acute Inpatient Hospital | Payer: Medicare HMO | Attending: Emergency Medicine | Admitting: Emergency Medicine

## 2023-01-09 ENCOUNTER — Other Ambulatory Visit: Payer: Self-pay

## 2023-01-09 DIAGNOSIS — Z1152 Encounter for screening for COVID-19: Secondary | ICD-10-CM | POA: Diagnosis not present

## 2023-01-09 DIAGNOSIS — R45850 Homicidal ideations: Secondary | ICD-10-CM | POA: Insufficient documentation

## 2023-01-09 DIAGNOSIS — Z79899 Other long term (current) drug therapy: Secondary | ICD-10-CM | POA: Diagnosis not present

## 2023-01-09 DIAGNOSIS — F319 Bipolar disorder, unspecified: Secondary | ICD-10-CM | POA: Insufficient documentation

## 2023-01-09 DIAGNOSIS — R059 Cough, unspecified: Secondary | ICD-10-CM | POA: Insufficient documentation

## 2023-01-09 DIAGNOSIS — R45851 Suicidal ideations: Secondary | ICD-10-CM | POA: Insufficient documentation

## 2023-01-09 DIAGNOSIS — Y908 Blood alcohol level of 240 mg/100 ml or more: Secondary | ICD-10-CM | POA: Insufficient documentation

## 2023-01-09 DIAGNOSIS — F1099 Alcohol use, unspecified with unspecified alcohol-induced disorder: Secondary | ICD-10-CM | POA: Diagnosis present

## 2023-01-09 DIAGNOSIS — C229 Malignant neoplasm of liver, not specified as primary or secondary: Secondary | ICD-10-CM | POA: Insufficient documentation

## 2023-01-09 DIAGNOSIS — F10129 Alcohol abuse with intoxication, unspecified: Secondary | ICD-10-CM | POA: Insufficient documentation

## 2023-01-09 DIAGNOSIS — Z560 Unemployment, unspecified: Secondary | ICD-10-CM | POA: Insufficient documentation

## 2023-01-09 DIAGNOSIS — K7460 Unspecified cirrhosis of liver: Secondary | ICD-10-CM | POA: Insufficient documentation

## 2023-01-09 DIAGNOSIS — Z5901 Sheltered homelessness: Secondary | ICD-10-CM | POA: Insufficient documentation

## 2023-01-09 LAB — COMPREHENSIVE METABOLIC PANEL
ALT: 69 U/L — ABNORMAL HIGH (ref 0–44)
AST: 91 U/L — ABNORMAL HIGH (ref 15–41)
Albumin: 3.9 g/dL (ref 3.5–5.0)
Alkaline Phosphatase: 79 U/L (ref 38–126)
Anion gap: 11 (ref 5–15)
BUN: 7 mg/dL — ABNORMAL LOW (ref 8–23)
CO2: 27 mmol/L (ref 22–32)
Calcium: 8.9 mg/dL (ref 8.9–10.3)
Chloride: 97 mmol/L — ABNORMAL LOW (ref 98–111)
Creatinine, Ser: 0.77 mg/dL (ref 0.61–1.24)
GFR, Estimated: >60 mL/min (ref >60)
Glucose, Bld: 101 mg/dL — ABNORMAL HIGH (ref 70–99)
Potassium: 3.8 mmol/L (ref 3.5–5.1)
Sodium: 135 mmol/L (ref 135–145)
Total Bilirubin: 2.1 mg/dL — ABNORMAL HIGH (ref 0.3–1.2)
Total Protein: 8.5 g/dL — ABNORMAL HIGH (ref 6.5–8.1)

## 2023-01-09 LAB — URINALYSIS, ROUTINE W REFLEX MICROSCOPIC
Bilirubin Urine: NEGATIVE
Glucose, UA: NEGATIVE mg/dL
Hgb urine dipstick: NEGATIVE
Ketones, ur: NEGATIVE mg/dL
Leukocytes,Ua: NEGATIVE
Nitrite: NEGATIVE
Protein, ur: NEGATIVE mg/dL
Specific Gravity, Urine: 1.002 — ABNORMAL LOW (ref 1.005–1.030)
pH: 6 (ref 5.0–8.0)

## 2023-01-09 LAB — CBC
HCT: 43 % (ref 39.0–52.0)
Hemoglobin: 13.2 g/dL (ref 13.0–17.0)
MCH: 23.4 pg — ABNORMAL LOW (ref 26.0–34.0)
MCHC: 30.7 g/dL (ref 30.0–36.0)
MCV: 76.2 fL — ABNORMAL LOW (ref 80.0–100.0)
Platelets: 96 10*3/uL — ABNORMAL LOW (ref 150–400)
RBC: 5.64 MIL/uL (ref 4.22–5.81)
RDW: 18.7 % — ABNORMAL HIGH (ref 11.5–15.5)
WBC: 4.3 10*3/uL (ref 4.0–10.5)
nRBC: 0 % (ref 0.0–0.2)

## 2023-01-09 LAB — MAGNESIUM: Magnesium: 2.1 mg/dL (ref 1.7–2.4)

## 2023-01-09 LAB — ACETAMINOPHEN LEVEL: Acetaminophen (Tylenol), Serum: <10 ug/mL — ABNORMAL LOW (ref 10–30)

## 2023-01-09 LAB — LIPID PANEL
Cholesterol: 174 mg/dL (ref 0–200)
HDL: 92 mg/dL (ref >40)
LDL Cholesterol: 71 mg/dL (ref 0–99)
Total CHOL/HDL Ratio: 1.9 RATIO
Triglycerides: 53 mg/dL (ref <150)
VLDL: 11 mg/dL (ref 0–40)

## 2023-01-09 LAB — RAPID URINE DRUG SCREEN, HOSP PERFORMED
Amphetamines: NOT DETECTED
Barbiturates: NOT DETECTED
Benzodiazepines: NOT DETECTED
Cocaine: NOT DETECTED
Opiates: NOT DETECTED
Tetrahydrocannabinol: NOT DETECTED

## 2023-01-09 LAB — RESP PANEL BY RT-PCR (RSV, FLU A&B, COVID)  RVPGX2
Influenza A by PCR: NEGATIVE
Influenza B by PCR: NEGATIVE
Resp Syncytial Virus by PCR: NEGATIVE
SARS Coronavirus 2 by RT PCR: NEGATIVE

## 2023-01-09 LAB — TSH: TSH: 2.744 u[IU]/mL (ref 0.350–4.500)

## 2023-01-09 LAB — SALICYLATE LEVEL: Salicylate Lvl: <7 mg/dL — ABNORMAL LOW (ref 7.0–30.0)

## 2023-01-09 LAB — ETHANOL: Alcohol, Ethyl (B): 258 mg/dL — ABNORMAL HIGH (ref <10)

## 2023-01-09 MED ORDER — THIAMINE HCL 100 MG/ML IJ SOLN
100.0000 mg | Freq: Once | INTRAMUSCULAR | Status: AC
  Administered 2023-01-09: 100 mg via INTRAMUSCULAR
  Filled 2023-01-09: qty 2

## 2023-01-09 MED ORDER — THIAMINE MONONITRATE 100 MG PO TABS: 100.0000 mg | ORAL_TABLET | Freq: Every day | ORAL | Status: DC

## 2023-01-09 MED ORDER — ADULT MULTIVITAMIN W/MINERALS CH
1.0000 | ORAL_TABLET | Freq: Every day | ORAL | Status: DC
  Administered 2023-01-09: 1 via ORAL
  Filled 2023-01-09: qty 1

## 2023-01-09 MED ORDER — HYDROXYZINE HCL 25 MG PO TABS
25.0000 mg | ORAL_TABLET | Freq: Four times a day (QID) | ORAL | Status: DC | PRN
  Administered 2023-01-09: 25 mg via ORAL
  Filled 2023-01-09: qty 1

## 2023-01-09 MED ORDER — LORAZEPAM 1 MG PO TABS: 1.0000 mg | ORAL_TABLET | Freq: Four times a day (QID) | ORAL | Status: DC | PRN

## 2023-01-09 MED ORDER — OLANZAPINE 5 MG PO TABS
5.0000 mg | ORAL_TABLET | Freq: Every day | ORAL | Status: DC
  Administered 2023-01-09: 5 mg via ORAL
  Filled 2023-01-09: qty 1

## 2023-01-09 MED ORDER — AMLODIPINE BESYLATE 5 MG PO TABS
10.0000 mg | ORAL_TABLET | Freq: Every day | ORAL | Status: DC
  Administered 2023-01-09: 10 mg via ORAL
  Filled 2023-01-09: qty 2

## 2023-01-09 MED ORDER — GABAPENTIN 300 MG PO CAPS
300.0000 mg | ORAL_CAPSULE | Freq: Once | ORAL | Status: AC
  Administered 2023-01-09: 300 mg via ORAL
  Filled 2023-01-09: qty 1

## 2023-01-09 MED ORDER — LOPERAMIDE HCL 2 MG PO CAPS: 2.0000 mg | ORAL_CAPSULE | ORAL | Status: DC | PRN

## 2023-01-09 MED ORDER — ONDANSETRON 4 MG PO TBDP: 4.0000 mg | ORAL_TABLET | Freq: Four times a day (QID) | ORAL | Status: DC | PRN

## 2023-01-09 MED ORDER — GABAPENTIN 300 MG PO CAPS: 300.0000 mg | ORAL_CAPSULE | Freq: Two times a day (BID) | ORAL | Status: DC

## 2023-01-09 MED ORDER — GABAPENTIN 300 MG PO CAPS: 300.0000 mg | ORAL_CAPSULE | Freq: Three times a day (TID) | ORAL | Status: DC

## 2023-01-09 NOTE — ED Notes (Signed)
Pt was accepted to [facility] Address: [address] [place], [place] [address] TODAY 01/09/23   Report to [phone]

## 2023-01-09 NOTE — ED Provider Notes (Signed)
[place] EMERGENCY DEPARTMENT AT [facility] Provider Note   CSN: [id number] Arrival date & time: 01/09/23  1517     History  Chief Complaint  Patient presents with   Suicidal   Homicidal   Cough    [name] is a 67 y.o. male. With past medical history of depression, alcohol abuse, personality disorder, cirrhosis, hepatitis C, pancreatitis, previous SI who presents to the emergency department with SI, HI, cough.  States he is having suicidal ideations. He states these have been ongoing for a while. He notes he would maybe cut himself like he has tried before but "wishes he was dead right now." He also states he wants to kill the male that lives with him. He states her name is [name]. He does not say why or how he would do this. He denies AVH. States he has history of self harm. He also endorses daily alcohol use that he does not quantify. He denies withdrawal seizure previously. He states he has also had cough. He denies shortness of breath or chest pain. Denies abdominal pain, nausea, vomiting, diarrhea.   HPI     Home Medications Prior to Admission medications   Medication Sig Start Date End Date Taking? Authorizing Provider  amLODipine (NORVASC) 10 MG tablet Take 1 tablet (10 mg total) by mouth daily. 06/15/22 07/15/22  France Ravens, MD  dicyclomine (BENTYL) 20 MG tablet Take 1 tablet (20 mg total) by mouth 2 (two) times daily as needed (abdominal pain). 07/09/22   Petrucelli, Samantha R, PA-C  ferrous sulfate 325 (65 FE) MG tablet Take 1 tablet (325 mg total) by mouth daily with breakfast. 06/14/22 07/14/22  France Ravens, MD  gabapentin (NEURONTIN) 300 MG capsule Take 1 capsule (300 mg total) by mouth 2 (two) times daily. 07/08/22 08/07/22  Merrily Brittle, DO  hydrOXYzine (VISTARIL) 25 MG capsule Take 25 mg by mouth 2 (two) times daily as needed (For anxiety or sleep). 06/28/22   [provider]  metroNIDAZOLE (FLAGYL) 500 MG tablet Take 1 tablet (500 mg total)  by mouth 3 (three) times daily. 07/09/22   Petrucelli, Samantha R, PA-C  Multiple Vitamin (MULTIVITAMIN WITH MINERALS) TABS tablet Take 1 tablet by mouth daily. 07/09/22   Merrily Brittle, DO  thiamine (VITAMIN B-1) 100 MG tablet Take 1 tablet (100 mg total) by mouth daily. 07/09/22   Merrily Brittle, DO      Allergies    Carrot [daucus carota]    Review of Systems   Review of Systems  Respiratory:  Positive for cough.   Psychiatric/Behavioral:  Positive for behavioral problems and suicidal ideas.   All other systems reviewed and are negative.   Physical Exam Updated Vital Signs BP (!) 166/87 (BP Location: Right Arm)   Pulse 74   Temp 97.8 F (36.6 C) (Oral)   Resp 18   SpO2 100%  Physical Exam Vitals and nursing note reviewed.  Constitutional:      General: He is not in acute distress.    Appearance: He is not ill-appearing or toxic-appearing.  HENT:     Head: Normocephalic and atraumatic.     Mouth/Throat:     Mouth: Mucous membranes are dry.  Eyes:     General: No scleral icterus.    Extraocular Movements: Extraocular movements intact.  Cardiovascular:     Rate and Rhythm: Normal rate and regular rhythm.     Pulses: Normal pulses.     Heart sounds: No murmur heard. Pulmonary:  Effort: Pulmonary effort is normal. No respiratory distress.     Breath sounds: Normal breath sounds.  Abdominal:     General: There is no distension.     Palpations: Abdomen is soft.     Tenderness: There is no abdominal tenderness.  Skin:    General: Skin is warm and dry.     Findings: No rash.  Neurological:     General: No focal deficit present.     Mental Status: He is alert and oriented to person, place, and time.  Psychiatric:        Mood and Affect: Mood normal.        Behavior: Behavior normal.        Thought Content: Thought content normal.        Judgment: Judgment normal.     ED Results / Procedures / Treatments   Labs (all labs ordered are listed, but only abnormal  results are displayed) Labs Reviewed  COMPREHENSIVE METABOLIC PANEL - Abnormal; Notable for the following components:      Result Value   Chloride 97 (*)    Glucose, Bld 101 (*)    BUN 7 (*)    Total Protein 8.5 (*)    AST 91 (*)    ALT 69 (*)    Total Bilirubin 2.1 (*)    All other components within normal limits  ETHANOL - Abnormal; Notable for the following components:   Alcohol, Ethyl (B) 258 (*)    All other components within normal limits  SALICYLATE LEVEL - Abnormal; Notable for the following components:   Salicylate Lvl [id number] (*)    All other components within normal limits  ACETAMINOPHEN LEVEL - Abnormal; Notable for the following components:   Acetaminophen (Tylenol), Serum <10 (*)    All other components within normal limits  RESP PANEL BY RT-PCR (RSV, FLU A&B, COVID)  RVPGX2  RAPID URINE DRUG SCREEN, HOSP PERFORMED  CBC    EKG None  Radiology No results found.  Procedures Procedures   Medications Ordered in ED Medications - No data to display  ED Course/ Medical Decision Making/ A&P    Medical Decision Making Amount and/or Complexity of Data Reviewed Labs: ordered.  Initial Impression and Ddx 67 year old male. Intoxicated. Here with SI/HI.  Patient PMH that increases complexity of ED encounter: Alcohol abuse complicated by cirrhosis, pancreatitis, hepatitis C, personality disorder   Interpretation of Diagnostics I independent reviewed and interpreted the labs as followed: Alcohol is 258, mild transaminitis, consistent with alcohol use.  Coingestion is negative.  - I independently visualized the following imaging with scope of interpretation limited to determining acute life threatening conditions related to emergency care: Not indicated  Patient Reassessment and Ultimate Disposition/Management 67 year old male who presents to the emergency department with SI/HI. On my exam he is laying in bed in no acute distress.  His vitals are stable.  He is  intoxicated.  Alcohol here is 258.  He does participate in conversation.  Tells me that he is suicidal and thinks about cutting himself.  Also thinking about hurting the male that he lives with.  He denies AVH. Not appear to be responding to internal stimuli.  He is not elated, tangential or inappropriate.   Does not appear to be having any alcohol withdrawal symptoms at this time. His labs are overall unremarkable.  He will be medically cleared at this time to be evaluated by TTS.  Nursing to inform provider staff if patient shows signs of withdrawal.  Patient management  required discussion with the following services or consulting groups:  None and Psychiatry/TTS  Complexity of Problems Addressed Chronic illness with exacerbation  Additional Data Reviewed and Analyzed Further history obtained from: Past medical history and medications listed in the EMR, Prior ED visit notes, and Care Everywhere  Patient Encounter Risk Assessment SDOH impact on management and Consideration of hospitalization  Final Clinical Impression(s) / ED Diagnoses Final diagnoses:  Suicidal ideation  Homicidal ideation    Rx / DC Orders ED Discharge Orders     None         Mickie Hillier, PA-C 01/09/23 1747    Sherwood Gambler, MD 01/10/23 1621
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
## 2023-01-09 NOTE — ED Notes (Signed)
Pt receive food tray

## 2023-01-09 NOTE — Progress Notes (Signed)
Pt was accepted to [facility] Address: [address], [place], [place] [address] TODAY 01/09/23  Pt meets inpatient criteria per Earleen Newport, NP   Attending Physician will be Dr. Dwyane Dee  Report can be called to: - [phone]  Pt can arrive after BED IS READY   Care team was notified: [facility] Scharlene Gloss, [place] Cornelia Copa, RN, [place] Baseman, RN   Benjaman Kindler, MSW, [facility] 01/09/2023 10:31 PM

## 2023-01-10 ENCOUNTER — Other Ambulatory Visit (HOSPITAL_COMMUNITY)
Admission: EM | Admit: 2023-01-10 | Discharge: 2023-01-12 | Disposition: A | Discharge status: Home / Self Care | Payer: Medicare HMO | Attending: Psychiatry | Admitting: Psychiatry

## 2023-01-10 DIAGNOSIS — F1020 Alcohol dependence, uncomplicated: Secondary | ICD-10-CM | POA: Diagnosis present

## 2023-01-10 DIAGNOSIS — C229 Malignant neoplasm of liver, not specified as primary or secondary: Secondary | ICD-10-CM | POA: Diagnosis not present

## 2023-01-10 DIAGNOSIS — K7460 Unspecified cirrhosis of liver: Secondary | ICD-10-CM | POA: Insufficient documentation

## 2023-01-10 DIAGNOSIS — Z5900 Homelessness unspecified: Secondary | ICD-10-CM

## 2023-01-10 DIAGNOSIS — Z1152 Encounter for screening for COVID-19: Secondary | ICD-10-CM | POA: Diagnosis not present

## 2023-01-10 DIAGNOSIS — Z560 Unemployment, unspecified: Secondary | ICD-10-CM | POA: Insufficient documentation

## 2023-01-10 DIAGNOSIS — F319 Bipolar disorder, unspecified: Secondary | ICD-10-CM | POA: Insufficient documentation

## 2023-01-10 DIAGNOSIS — Z5901 Sheltered homelessness: Secondary | ICD-10-CM | POA: Diagnosis not present

## 2023-01-10 DIAGNOSIS — F1010 Alcohol abuse, uncomplicated: Secondary | ICD-10-CM | POA: Diagnosis not present

## 2023-01-10 DIAGNOSIS — F10129 Alcohol abuse with intoxication, unspecified: Secondary | ICD-10-CM | POA: Diagnosis not present

## 2023-01-10 MED ORDER — THIAMINE MONONITRATE 100 MG PO TABS
100.0000 mg | ORAL_TABLET | Freq: Every day | ORAL | Status: DC
  Administered 2023-01-11 – 2023-01-12 (×2): 100 mg via ORAL
  Filled 2023-01-10 (×2): qty 1

## 2023-01-10 MED ORDER — HYDROXYZINE HCL 25 MG PO TABS
25.0000 mg | ORAL_TABLET | Freq: Four times a day (QID) | ORAL | Status: DC | PRN
  Administered 2023-01-10 (×2): 25 mg via ORAL
  Filled 2023-01-10 (×2): qty 1

## 2023-01-10 MED ORDER — MAGNESIUM HYDROXIDE 400 MG/5ML PO SUSP: 30.0000 mL | Freq: Every day | ORAL | Status: DC | PRN

## 2023-01-10 MED ORDER — LORAZEPAM 1 MG PO TABS
1.0000 mg | ORAL_TABLET | Freq: Two times a day (BID) | ORAL | Status: DC
  Administered 2023-01-12: 1 mg via ORAL
  Filled 2023-01-10: qty 1

## 2023-01-10 MED ORDER — LORAZEPAM 1 MG PO TABS
1.0000 mg | ORAL_TABLET | Freq: Three times a day (TID) | ORAL | Status: AC
  Administered 2023-01-11 (×3): 1 mg via ORAL
  Filled 2023-01-10 (×3): qty 1

## 2023-01-10 MED ORDER — OLANZAPINE 5 MG PO TBDP: 5.0000 mg | ORAL_TABLET | Freq: Three times a day (TID) | ORAL | Status: DC | PRN

## 2023-01-10 MED ORDER — LORAZEPAM 1 MG PO TABS: 1.0000 mg | ORAL_TABLET | Freq: Four times a day (QID) | ORAL | Status: DC | PRN

## 2023-01-10 MED ORDER — ALUM & MAG HYDROXIDE-SIMETH 200-200-20 MG/5ML PO SUSP: 30.0000 mL | ORAL | Status: DC | PRN

## 2023-01-10 MED ORDER — ONDANSETRON 4 MG PO TBDP: 4.0000 mg | ORAL_TABLET | Freq: Four times a day (QID) | ORAL | Status: DC | PRN

## 2023-01-10 MED ORDER — ACETAMINOPHEN 325 MG PO TABS
650.0000 mg | ORAL_TABLET | Freq: Four times a day (QID) | ORAL | Status: DC | PRN
  Administered 2023-01-10: 650 mg via ORAL
  Filled 2023-01-10: qty 2

## 2023-01-10 MED ORDER — DICYCLOMINE HCL 20 MG PO TABS: 20.0000 mg | ORAL_TABLET | Freq: Two times a day (BID) | ORAL | Status: DC | PRN

## 2023-01-10 MED ORDER — LOPERAMIDE HCL 2 MG PO CAPS: 2.0000 mg | ORAL_CAPSULE | ORAL | Status: DC | PRN

## 2023-01-10 MED ORDER — LORAZEPAM 1 MG PO TABS
1.0000 mg | ORAL_TABLET | Freq: Four times a day (QID) | ORAL | Status: AC
  Administered 2023-01-10 (×4): 1 mg via ORAL
  Filled 2023-01-10 (×4): qty 1

## 2023-01-10 MED ORDER — ADULT MULTIVITAMIN W/MINERALS CH
1.0000 | ORAL_TABLET | Freq: Every day | ORAL | Status: DC
  Administered 2023-01-10 – 2023-01-12 (×3): 1 via ORAL
  Filled 2023-01-10 (×3): qty 1

## 2023-01-10 MED ORDER — GABAPENTIN 300 MG PO CAPS
300.0000 mg | ORAL_CAPSULE | Freq: Three times a day (TID) | ORAL | Status: DC
  Administered 2023-01-10 – 2023-01-12 (×7): 300 mg via ORAL
  Filled 2023-01-10 (×7): qty 1

## 2023-01-10 MED ORDER — METRONIDAZOLE 250 MG PO TABS
500.0000 mg | ORAL_TABLET | Freq: Three times a day (TID) | ORAL | Status: DC
  Administered 2023-01-10 – 2023-01-12 (×7): 500 mg via ORAL
  Filled 2023-01-10 (×7): qty 2

## 2023-01-10 MED ORDER — ZIPRASIDONE MESYLATE 20 MG IM SOLR: 20.0000 mg | INTRAMUSCULAR | Status: DC | PRN

## 2023-01-10 MED ORDER — THIAMINE HCL 100 MG/ML IJ SOLN
100.0000 mg | Freq: Once | INTRAMUSCULAR | Status: AC
  Administered 2023-01-10: 100 mg via INTRAMUSCULAR
  Filled 2023-01-10: qty 2

## 2023-01-10 MED ORDER — LORAZEPAM 1 MG PO TABS: 1.0000 mg | ORAL_TABLET | Freq: Every day | ORAL | Status: DC

## 2023-01-10 NOTE — ED Notes (Signed)
Patient observed/assessed at bedside sitting on edge of bed. Eye contact minimal and affect is flat. Patient alert and oriented x 4. Patient denies pain and anxiety. He denies A/V/H. He denies having any thoughts/plan of self harm and harm towards others. Fluid and snack offered. Patient states that appetite has been good throughout the day.  Verbalizes no further complaints at this time. Will continue to monitor and support.

## 2023-01-10 NOTE — ED Notes (Signed)
Patient has been asleep most of the day.  He has gotten up for meals however appears constricted with sad affect.  Patient is calm and cooperative with care.  No further withdrawal symptoms.  Tolerating ativan taper.  Will monitor and provide a safe environment.

## 2023-01-10 NOTE — Tx Team (Signed)
LCSW and MD met with patient to assess current mood, affect, physical state, and inquire about needs/goals while here in [facility] and after discharge. Patient reports he presented due to being homeless and needed long-term residential placement. Patient reports [place]. Patient reports he was released from prison in 2022 where he served an 11-year sentence. Patient reports discharging with no support. Patient reports he receives about $964 in SS each month. Patient reports no interest in residing in a boarding house at this time. Patient reports an interest in becoming stable on medications. Patient reports a prior history of self-injurious behaviors via cutting a.e.b. deep cut on left arm. Patient reports he has had thoughts of SI stating, "I don't want to live". When asked if he has a plan to self-harm, patient stated "I would just drink myself to death". Patient reports he drinks about 8 40oz beers a day and denies any other substance use. Patient reports his goal is to get into a long-term residential program to work towards recovery. Per chart, "[name] 67 year old male presents to [facility] voluntary brought by [employer]. Patient stated, "I was just discharged 2 1/2 weeks ago but I need help. I need a long-term facility." Per chart review patient was discharged from [facility] 06/01/2022. He reports continues use of drinking alcohol, an abusing cocaine / crack cocaine. Report drank four 40oz beers since 5am this morning. Last abuse crack cocaine Wednesday $50 worth. Report suicidal ideations no plan, homicidal ideations if someone missing with me and auditory/visual hallucinations. Report, "I think every day about taking my life. I wish I wasn't alive today." Report auditory hallucinations triggered by a busted ear drum in his right ear". Patient aware that LCSW will send referrals out for review and will follow up to provide updates as received. Patient expressed understanding and  appreciation of LCSW assistance. No other needs were reported at this time by patient.   Referral to be sent [facility] in [place], [facility] in [facility], and [employer] for review.   Lucius Conn, LCSW Clinical Social Worker [facility] Ph: [phone]

## 2023-01-10 NOTE — ED Notes (Signed)
Patient observed/assessed in room in bed appearing in no immediate distress resting peacefully. Q15 minute checks continued by MHT and nursing staff. Will continue to monitor and support. 

## 2023-01-10 NOTE — ED Provider Notes (Signed)
Behavioral Health Progress Note  Date and Time: 01/10/2023 11:09 AM Name: [name] MRN:  [id number]  Subjective:  [name] is a 67 year old male with a psychiatric history of bipolar spectrum disorder alcohol use disorder who presented voluntarily from [facility] for alcohol detox, for which he was admitted to [facility].  On AM assessment, patient reports feeling down and depressed. He says that he has been drinking to the point that his intake it "out of control." Patient endorses daily alcohol consumption of 6-8 of 40 oz beers. There is no stre His appetite ha s of oputnobeen  gh  iagnosis:  Final diagnoses:  Alcohol abuse with intoxication ([place])  Homelessness unspecified    Total Time spent with patient: 30 minutes  Past Psychiatric History: bipolar disorder and multiple psychiatric hospitalization "[facility] [place]." Past Medical History: *** Family History: *** Family Psychiatric  History: *** Social History: ***  Additional Social History:                         Sleep: {BHH GOOD/FAIR/POOR:22877}  Appetite:  {BHH GOOD/FAIR/POOR:22877}  Current Medications:  Current Facility-Administered Medications  Medication Dose Route Frequency Provider Last Rate Last Admin  . acetaminophen (TYLENOL) tablet 650 mg  650 mg Oral Q6H PRN Evette Georges, NP      . alum & mag hydroxide-simeth (MAALOX/MYLANTA) 200-200-20 MG/5ML suspension 30 mL  30 mL Oral Q4H PRN Evette Georges, NP      . dicyclomine (BENTYL) tablet 20 mg  20 mg Oral BID PRN Evette Georges, NP      . gabapentin (NEURONTIN) capsule 300 mg  300 mg Oral TID Evette Georges, NP   300 mg at 01/10/23 0913  . hydrOXYzine (ATARAX) tablet 25 mg  25 mg Oral Q6H PRN Evette Georges, NP   25 mg at 01/10/23 0559  . loperamide (IMODIUM) capsule 2-4 mg  2-4 mg Oral PRN Evette Georges, NP      . LORazepam (ATIVAN) tablet 1 mg  1 mg Oral Q6H PRN Evette Georges, NP      . LORazepam (ATIVAN) tablet 1 mg  1 mg Oral QID Evette Georges, NP    1 mg at 01/10/23 0912   Followed by  . [START ON 01/11/2023] LORazepam (ATIVAN) tablet 1 mg  1 mg Oral TID Evette Georges, NP       Followed by  . [START ON 01/12/2023] LORazepam (ATIVAN) tablet 1 mg  1 mg Oral BID Evette Georges, NP       Followed by  . [START ON 01/13/2023] LORazepam (ATIVAN) tablet 1 mg  1 mg Oral Daily Evette Georges, NP      . magnesium hydroxide (MILK OF MAGNESIA) suspension 30 mL  30 mL Oral Daily PRN Evette Georges, NP      . metroNIDAZOLE (FLAGYL) tablet 500 mg  500 mg Oral TID Evette Georges, NP   500 mg at 01/10/23 0913  . multivitamin with minerals tablet 1 tablet  1 tablet Oral Daily Evette Georges, NP   1 tablet at 01/10/23 0913  . OLANZapine zydis (ZYPREXA) disintegrating tablet 5 mg  5 mg Oral Q8H PRN Evette Georges, NP       And  . ziprasidone (GEODON) injection 20 mg  20 mg Intramuscular PRN Evette Georges, NP      . ondansetron (ZOFRAN-ODT) disintegrating tablet 4 mg  4 mg Oral Q6H PRN Evette Georges, NP      . Derrill Memo ON 01/11/2023] thiamine (VITAMIN B1)  tablet 100 mg  100 mg Oral Daily Evette Georges, NP       Current Outpatient Medications  Medication Sig Dispense Refill  . amLODipine (NORVASC) 10 MG tablet Take 1 tablet (10 mg total) by mouth daily. (Patient not taking: Reported on 01/09/2023) 30 tablet 0  . dicyclomine (BENTYL) 20 MG tablet Take 1 tablet (20 mg total) by mouth 2 (two) times daily as needed (abdominal pain). (Patient not taking: Reported on 01/09/2023) 10 tablet 0  . ferrous sulfate 325 (65 FE) MG tablet Take 1 tablet (325 mg total) by mouth daily with breakfast. (Patient not taking: Reported on 01/09/2023) 30 tablet 0  . gabapentin (NEURONTIN) 300 MG capsule Take 1 capsule (300 mg total) by mouth 2 (two) times daily. (Patient not taking: Reported on 01/09/2023) 60 capsule 0  . gabapentin (NEURONTIN) 300 MG capsule Take 300 mg by mouth 3 (three) times daily.    . metroNIDAZOLE (FLAGYL) 500 MG tablet Take 1 tablet (500 mg total) by mouth 3 (three) times  daily. (Patient not taking: Reported on 01/09/2023) 21 tablet 0  . Multiple Vitamin (MULTIVITAMIN WITH MINERALS) TABS tablet Take 1 tablet by mouth daily. (Patient not taking: Reported on 01/09/2023)    . thiamine (VITAMIN B-1) 100 MG tablet Take 1 tablet (100 mg total) by mouth daily. (Patient not taking: Reported on 01/09/2023)      Labs  Lab Results:  Admission on 01/09/2023, Discharged on 01/09/2023  Component Date Value Ref Range Status  . Sodium 01/09/2023 135  135 - 145 mmol/L Final  . Potassium 01/09/2023 3.8  3.5 - 5.1 mmol/L Final  . Chloride 01/09/2023 97 (L)  98 - 111 mmol/L Final  . CO2 01/09/2023 27  22 - 32 mmol/L Final  . Glucose, Bld 01/09/2023 101 (H)  70 - 99 mg/dL Final   Glucose reference range applies only to samples taken after fasting for at least 8 hours.  . BUN 01/09/2023 7 (L)  8 - 23 mg/dL Final  . Creatinine, Ser 01/09/2023 0.77  0.61 - 1.24 mg/dL Final  . Calcium 01/09/2023 8.9  8.9 - 10.3 mg/dL Final  . Total Protein 01/09/2023 8.5 (H)  6.5 - 8.1 g/dL Final  . Albumin 01/09/2023 3.9  3.5 - 5.0 g/dL Final  . AST 01/09/2023 91 (H)  15 - 41 U/L Final  . ALT 01/09/2023 69 (H)  0 - 44 U/L Final  . Alkaline Phosphatase 01/09/2023 79  38 - 126 U/L Final  . Total Bilirubin 01/09/2023 2.1 (H)  0.3 - 1.2 mg/dL Final  . GFR, Estimated 01/09/2023 >60  >60 mL/min Final   Comment: (NOTE) Calculated using the CKD-EPI Creatinine Equation (2021)   . Anion gap 01/09/2023 11  5 - 15 Final   Performed at [facility], [place] [address], [place], [place] [address]  . Alcohol, Ethyl (B) 01/09/2023 258 (H)  <10 mg/dL Final   Comment: (NOTE) Lowest detectable limit for serum alcohol is 10 mg/dL.  For medical purposes only. Performed at [facility], [place] [address] [place], [place] [address]   . Salicylate Lvl A999333 <7.0 (L)  7.0 - 30.0 mg/dL Final   Performed at [facility] [address], [place], [place]  [address]  . Acetaminophen (Tylenol), Serum 01/09/2023 <10 (L)  10 - 30 ug/mL Final   Comment: (NOTE) Therapeutic concentrations vary significantly. A range of 10-30 ug/mL  may be an effective concentration for many patients. However, some  are best  treated at concentrations outside of this range. Acetaminophen concentrations >150 ug/mL at 4 hours after ingestion  and >50 ug/mL at 12 hours after ingestion are often associated with  toxic reactions.  Performed at [facility], [place] [address] [place], [place] [address]   . WBC 01/09/2023 4.3  4.0 - 10.5 K/uL Final  . RBC 01/09/2023 5.64  4.22 - 5.81 MIL/uL Final  . Hemoglobin 01/09/2023 13.2  13.0 - 17.0 g/dL Final  . HCT 01/09/2023 43.0  39.0 - 52.0 % Final  . MCV 01/09/2023 76.2 (L)  80.0 - 100.0 fL Final  . MCH 01/09/2023 23.4 (L)  26.0 - 34.0 pg Final  . MCHC 01/09/2023 30.7  30.0 - 36.0 g/dL Final  . RDW 01/09/2023 18.7 (H)  11.5 - 15.5 % Final  . Platelets 01/09/2023 96 (L)  150 - 400 K/uL Final   Comment: SPECIMEN CHECKED FOR CLOTS REPEATED TO VERIFY PLATELET COUNT CONFIRMED BY SMEAR   . nRBC 01/09/2023 0.0  0.0 - 0.2 % Final   Performed at [facility] [address], [place], [place] [address]  . Opiates 01/09/2023 NONE DETECTED  NONE DETECTED Final  . Cocaine 01/09/2023 NONE DETECTED  NONE DETECTED Final  . Benzodiazepines 01/09/2023 NONE DETECTED  NONE DETECTED Final  . Amphetamines 01/09/2023 NONE DETECTED  NONE DETECTED Final  . Tetrahydrocannabinol 01/09/2023 NONE DETECTED  NONE DETECTED Final  . Barbiturates 01/09/2023 NONE DETECTED  NONE DETECTED Final   Comment: (NOTE) DRUG SCREEN FOR MEDICAL PURPOSES ONLY.  IF CONFIRMATION IS NEEDED FOR ANY PURPOSE, NOTIFY LAB WITHIN 5 DAYS.  LOWEST DETECTABLE LIMITS FOR URINE DRUG SCREEN Drug Class                     Cutoff (ng/mL) Amphetamine and metabolites    1000 Barbiturate and metabolites    200 Benzodiazepine                  200 Opiates and metabolites        300 Cocaine and metabolites        300 THC                            50 Performed at [facility], [place] [address], [place], [place] [address]   . SARS Coronavirus 2 by RT PCR 01/09/2023 NEGATIVE  NEGATIVE Final   Comment: (NOTE) SARS-CoV-2 target nucleic acids are NOT DETECTED.  The SARS-CoV-2 RNA is generally detectable in upper respiratory specimens during the acute phase of infection. The lowest concentration of SARS-CoV-2 viral copies this assay can detect is 138 copies/mL. A negative result does not preclude SARS-Cov-2 infection and should not be used as the sole basis for treatment or other patient management decisions. A negative result may occur with  improper specimen collection/handling, submission of specimen other than nasopharyngeal swab, presence of viral mutation(s) within the areas targeted by this assay, and inadequate number of viral copies(<138 copies/mL). A negative result must be combined with clinical observations, patient history, and epidemiological information. The expected result is Negative.  Fact Sheet for Patients:  EntrepreneurPulse.com.au  Fact Sheet for Healthcare Providers:  IncredibleEmployment.be  This test is no                          t yet approved or cleared by the [place] FDA and  has been authorized for detection  and/or diagnosis of SARS-CoV-2 by FDA under an Emergency Use Authorization (EUA). This EUA will remain  in effect (meaning this test can be used) for the duration of the COVID-19 declaration under Section 564(b)(1) of the Act, 21 U.S.C.section 360bbb-3(b)(1), unless the authorization is terminated  or revoked sooner.      . Influenza A by PCR 01/09/2023 NEGATIVE  NEGATIVE Final  . Influenza B by PCR 01/09/2023 NEGATIVE  NEGATIVE Final   Comment: (NOTE) The Xpert Xpress SARS-CoV-2/FLU/RSV plus assay is intended as an aid in the  diagnosis of influenza from Nasopharyngeal swab specimens and should not be used as a sole basis for treatment. Nasal washings and aspirates are unacceptable for Xpert Xpress SARS-CoV-2/FLU/RSV testing.  Fact Sheet for Patients: EntrepreneurPulse.com.au  Fact Sheet for Healthcare Providers: IncredibleEmployment.be  This test is not yet approved or cleared by the [place] FDA and has been authorized for detection and/or diagnosis of SARS-CoV-2 by FDA under an Emergency Use Authorization (EUA). This EUA will remain in effect (meaning this test can be used) for the duration of the COVID-19 declaration under Section 564(b)(1) of the Act, 21 U.S.C. section 360bbb-3(b)(1), unless the authorization is terminated or revoked.    Marland Kitchen Resp Syncytial Virus by PCR 01/09/2023 NEGATIVE  NEGATIVE Final   Comment: (NOTE) Fact Sheet for Patients: EntrepreneurPulse.com.au  Fact Sheet for Healthcare Providers: IncredibleEmployment.be  This test is not yet approved or cleared by the [place] FDA and has been authorized for detection and/or diagnosis of SARS-CoV-2 by FDA under an Emergency Use Authorization (EUA). This EUA will remain in effect (meaning this test can be used) for the duration of the COVID-19 declaration under Section 564(b)(1) of the Act, 21 U.S.C. section 360bbb-3(b)(1), unless the authorization is terminated or revoked.  Performed at [facility], [place] [address], [place], [place] [address]   . Cholesterol 01/09/2023 174  0 - 200 mg/dL Final  . Triglycerides 01/09/2023 53  <150 mg/dL Final  . HDL 01/09/2023 92  >40 mg/dL Final  . Total CHOL/HDL Ratio 01/09/2023 1.9  RATIO Final  . VLDL 01/09/2023 11  0 - 40 mg/dL Final  . LDL Cholesterol 01/09/2023 71  0 - 99 mg/dL Final   Comment:        Total Cholesterol/HDL:CHD Risk Coronary Heart Disease Risk Table                     Men    Women  1/2 Average Risk   3.4   3.3  Average Risk       5.0   4.4  2 X Average Risk   9.6   7.1  3 X Average Risk  23.4   11.0        Use the calculated Patient Ratio above and the CHD Risk Table to determine the patient's CHD Risk.        ATP III CLASSIFICATION (LDL):  <100     mg/dL   Optimal  100-129  mg/dL   Near or Above                    Optimal  130-159  mg/dL   Borderline  160-189  mg/dL   High  >190     mg/dL   Very High Performed at [facility] [address], [place], [place] [address]   . TSH 01/09/2023 2.744  0.350 - 4.500 uIU/mL Final   Comment: Performed by a 3rd Generation assay with  a functional sensitivity of <=0.01 uIU/mL. Performed at [facility], [place] [address], [place], [place] [address]   . Magnesium 01/09/2023 2.1  1.7 - 2.4 mg/dL Final   Performed at [facility] [address] [place], [place] [address]  . Color, Urine 01/09/2023 STRAW (A)  YELLOW Final  . APPearance 01/09/2023 CLEAR  CLEAR Final  . Specific Gravity, Urine 01/09/2023 1.002 (L)  1.005 - 1.030 Final  . pH 01/09/2023 6.0  5.0 - 8.0 Final  . Glucose, UA 01/09/2023 NEGATIVE  NEGATIVE mg/dL Final  . Hgb urine dipstick 01/09/2023 NEGATIVE  NEGATIVE Final  . Bilirubin Urine 01/09/2023 NEGATIVE  NEGATIVE Final  . Ketones, ur 01/09/2023 NEGATIVE  NEGATIVE mg/dL Final  . Protein, ur 01/09/2023 NEGATIVE  NEGATIVE mg/dL Final  . Nitrite 01/09/2023 NEGATIVE  NEGATIVE Final  . Chalmers Guest 01/09/2023 NEGATIVE  NEGATIVE Final   Performed at [facility] [address], [place], [place] [address]    Blood Alcohol level:  Lab Results  Component Value Date   ETH 258 (H) 01/09/2023   ETH <10 [id number]    Metabolic Disorder Labs: Lab Results  Component Value Date   HGBA1C 5.2 05/24/2022   MPG 102.54 05/24/2022   MPG 108 05/06/2009   Lab Results  Component Value Date   PROLACTIN 15.3 (H) 07/06/2022   Lab  Results  Component Value Date   CHOL 174 01/09/2023   TRIG 53 01/09/2023   HDL 92 01/09/2023   CHOLHDL 1.9 01/09/2023   VLDL 11 01/09/2023   LDLCALC 71 01/09/2023   LDLCALC 68 05/31/2022    Therapeutic Lab Levels: Lab Results  Component Value Date   LITHIUM <0.06 (L) 02/09/2021   No results found for: "VALPROATE" Lab Results  Component Value Date   CBMZ <0.3 (L) 11/19/2010    Physical Findings   AUDIT    Flow[facility]  Alcohol Use Disorder Identification Test Final Score (AUDIT) 14      CAGE-AID    Flowsheet Row ED to Hosp-Admission (Discharged) from 04/20/2022 in [facility] ED to Hosp-Admission (Discharged) from 02/07/2021 in [facility]  CAGE-AID Score 4 0      PHQ2-9    [place] ED from 01/10/2023 in [facility] ED from 07/06/2022 in [facility] ED from 06/10/2022 in [facility] ED from 05/29/2022 in [facility] ED from 01/12/2022 in [place]  PHQ-2 Total Score 4 0 6 6 2   PHQ-9 Total Score 19 23 21 17 7       Flowsheet Row ED from 01/10/2023 in [facility] ED from 01/09/2023 in [facility] Emergency Department at [facility] ED from 07/08/2022 in [facility] Emergency Department at [place] No Risk No Risk No Risk        Musculoskeletal  Strength & Muscle Tone: {desc; muscle tone:32375} Gait & Station: {PE GAIT ED QX:[id number] Patient leans: {Patient Leans:[id number]}  Psychiatric Specialty Exam  Presentation  General Appearance:  Casual  Eye Contact: Good  Speech: Clear and Coherent  Speech Volume: Normal  Handedness: Right   Mood and Affect  Mood: Anxious  Affect: Appropriate   Thought Process  Thought  Processes: Coherent  Descriptions of Associations:Circumstantial  Orientation:Full (Time, Place and Person)  Thought Content:WDL  Diagnosis of Schizophrenia or Schizoaffective disorder in past: No    Hallucinations:Hallucinations: None  Ideas of Reference:None  Suicidal Thoughts:Suicidal Thoughts: No SI Passive Intent and/or Plan: Without Intent  Homicidal Thoughts:Homicidal Thoughts: No HI Passive Intent and/or Plan: With Intent; Without Plan   Sensorium  Memory: Immediate Fair  Judgment: Poor  Insight: [place]   Materials engineer: Fair  Attention Span: Fair  Recall: [employer] of Knowledge: Fair  Language: Fair   Psychomotor Activity  Psychomotor Activity: Psychomotor Activity: Normal   Assets  Assets: Desire for Improvement; Housing   Sleep  Sleep: Sleep: Poor Number of Hours of Sleep: 4   Nutritional Assessment (For OBS and FBC admissions only) Has the patient had a weight loss or gain of 10 pounds or more in the last 3 months?: No Has the patient had a decrease in food intake/or appetite?: No Does the patient have dental problems?: No Does the patient have eating habits or behaviors that may be indicators of an eating disorder including binging or inducing vomiting?: No Has the patient recently lost weight without trying?: 0 Has the patient been eating poorly because of a decreased appetite?: 0 Malnutrition Screening Tool Score: 0    Physical Exam  Physical Exam ROS Blood pressure (!) 182/85, pulse 98, temperature 98.6 F (37 C), temperature source Oral, resp. rate 18, SpO2 98 %. There is no height or weight on file to calculate BMI.  Treatment Plan Summary: {CHL [facility] MD [place]. [id number]  Rosezetta Schlatter, MD 01/10/2023 11:09 AM

## 2023-01-10 NOTE — ED Provider Notes (Signed)
Facility Based Crisis Admission H&P  Date: 01/10/23 Patient Name: [name] MRN: [id number] Chief Complaint: increase alcohol use  Diagnoses:  Final diagnoses:  Alcohol abuse with intoxication [facility])  Homelessness unspecified    HPI: [name],  66 y/o male with a history of Alcohol abuse,  SI present to [facility] as a transfer from [place] for alcohol detox.  Per the patient he drinks 8 of 40s daily every day.  Patient is currently homeless, unemployed.  Per the patient he has tried detox many times but cannot remember where he did it.  When asked if he was suicidal patient stated not now.  Please see ED notes: [name] seen face to face by this provider, consulted with Dr. Hampton Abbot; and chart reviewed on 01/09/23.  On evaluation [name] reports he came to hospital because "I need to get some help.  I've been in prison most of my life and if I don't get help I'm going to end up back in prison and I don't want to go back."  Patient states stressors are alcohol use disorder "I drink 6 to 8 forties a day (beer).  I don't drink liquor cause it makes me crazy.  I have cirrhosis of the liver, and liver cancer."  He reports he has been staying in a motel with his girlfriend but being homeless is also a stressor.  "I can't find help no where.  I been trying to get assistance but I just can't find help."  He reports he receives SSI check and has Medicaid.  He reports that he has no support and girlfriend also drinks and does drugs "That's why I said I wanted to kill her."  Patient states he really doesn't want to die but wants to get his life on track "I get a check and I got good insurance, I just need to stop drinking and drugging and find a place to live.  I don't care if I go to a nursing home."  He reports a history of bipolar disorder and multiple psychiatric hospitalization "[place] and [place]."  States he was on psychotropic medications while in prison but can't give the names  "When you in prison they try anything just to see if it works.   Reports he is not currently taking psychotropic medications and has no outpatient psychiatric services.  He states he has not been sleeping well "I hardly ever get any sleep" and has had a decrease in appetite.  He denies auditory hallucinations at this time but states 3 days ago he was hearing the devil tell him to end his life.   During evaluation [name] is sitting on side of bed with no noted distress.  He is alert/oriented x 4, calm, cooperative, and attentive.  His responses were appropriate to assessment questions.  His mood is depressed and tearful with congruent affect.  He spoke in a clear tone at moderate volume, and normal pace, with good eye contact.   He denies active suicidal/homicidal ideation.  He also denies current psychosis, and paranoia.  Objectively:  there is no evidence of psychosis/mania or delusional thinking.  He conversed coherently, with goal directed thoughts, and no distractibility, or pre-occupation.  Recommending admission to [facility] facility base crisis unit for alcohol detox and social worker to assist in getting into a long term rehab facility   Face-to-face observation of patient, patient is alert and oriented x 4, speech is clear, maintaining eye contact.  Patient  appearance is in a hospital scrubs, patient is anxious affect is flat congruent with mood.  Patient currently denies SI, HI, AVH or paranoia.  Patient reported he drinks 8 of a 40 every day last drink prior to coming into the hospital.    Recommend for [facility]  PHQ 2-9:  [place] ED from 07/06/2022 in [facility] ED from 06/10/2022 in [facility] ED from 05/29/2022 in [facility]  Thoughts that you would be better off dead, or of hurting yourself in some way More than half the days Nearly every day Several days  PHQ-9 Total Score 23 21 17         Flowsheet Row ED from 01/10/2023 in [facility] ED from 01/09/2023 in [facility] Emergency Department at [facility] ED from 07/08/2022 in [facility] Emergency Department at [place] No Risk No Risk No Risk        Total Time spent with patient: 20 minutes  Musculoskeletal  Strength & Muscle Tone: within normal limits Gait & Station: normal Patient leans: N/A  Psychiatric Specialty Exam  Presentation General Appearance:  Casual  Eye Contact: Good  Speech: Clear and Coherent  Speech Volume: Normal  Handedness: Right   Mood and Affect  Mood: Anxious  Affect: Appropriate   Thought Process  Thought Processes: Coherent  Descriptions of Associations:Circumstantial  Orientation:Full (Time, Place and Person)  Thought Content:WDL  Diagnosis of Schizophrenia or Schizoaffective disorder in past: No   Hallucinations:Hallucinations: None  Ideas of Reference:None  Suicidal Thoughts:Suicidal Thoughts: No SI Passive Intent and/or Plan: Without Intent  Homicidal Thoughts:Homicidal Thoughts: No HI Passive Intent and/or Plan: With Intent; Without Plan   Sensorium  Memory: Immediate Fair  Judgment: Poor  Insight: [place]   Materials engineer: Fair  Attention Span: Fair  Recall: [employer] of Knowledge: Fair  Language: Fair   Psychomotor Activity  Psychomotor Activity: Psychomotor Activity: Normal   Assets  Assets: Desire for Improvement; Housing   Sleep  Sleep: Sleep: Poor Number of Hours of Sleep: 4   Nutritional Assessment (For OBS and FBC admissions only) Has the patient had a weight loss or gain of 10 pounds or more in the last 3 months?: No Has the patient had a decrease in food intake/or appetite?: No Does the patient have dental problems?: No Does the patient have eating habits or behaviors that may be indicators of an eating disorder  including binging or inducing vomiting?: No Has the patient recently lost weight without trying?: 0 Has the patient been eating poorly because of a decreased appetite?: 0 Malnutrition Screening Tool Score: 0    Physical Exam HENT:     Head: Normocephalic.     Nose: Nose normal.  Cardiovascular:     Rate and Rhythm: Normal rate.  Pulmonary:     Effort: Pulmonary effort is normal.  Musculoskeletal:        General: Normal range of motion.     Cervical back: Normal range of motion.  Neurological:     General: No focal deficit present.     Mental Status: He is alert.  Psychiatric:        Mood and Affect: Mood normal.        Behavior: Behavior normal.        Thought Content: Thought content normal.        Judgment: Judgment normal.    Review of  Systems  Constitutional: Negative.   HENT: Negative.    Eyes: Negative.   Respiratory: Negative.    Cardiovascular: Negative.   Gastrointestinal: Negative.   Genitourinary: Negative.   Musculoskeletal: Negative.   Skin: Negative.   Neurological: Negative.   Psychiatric/Behavioral:  Positive for depression and substance abuse. The patient is nervous/anxious.     Blood pressure (!) 167/83, pulse 91, temperature 98.4 F (36.9 C), temperature source Oral, resp. rate 18, SpO2 95 %. There is no height or weight on file to calculate BMI.  Past Psychiatric History: Alcohol abuse, SI   Is the patient at risk to self? No  Has the patient been a risk to self in the past 6 months? No .    Has the patient been a risk to self within the distant past? Yes   Is the patient a risk to others? No   Has the patient been a risk to others in the past 6 months? No   Has the patient been a risk to others within the distant past? No   Past Medical History: see chart Family History: unknown  Social History: alcohol   Last Labs:  Admission on 01/09/2023, Discharged on 01/09/2023  Component Date Value Ref Range Status   Sodium 01/09/2023 135  135 -  145 mmol/L Final   Potassium 01/09/2023 3.8  3.5 - 5.1 mmol/L Final   Chloride 01/09/2023 97 (L)  98 - 111 mmol/L Final   CO2 01/09/2023 27  22 - 32 mmol/L Final   Glucose, Bld 01/09/2023 101 (H)  70 - 99 mg/dL Final   Glucose reference range applies only to samples taken after fasting for at least 8 hours.   BUN 01/09/2023 7 (L)  8 - 23 mg/dL Final   Creatinine, Ser 01/09/2023 0.77  0.61 - 1.24 mg/dL Final   Calcium 01/09/2023 8.9  8.9 - 10.3 mg/dL Final   Total Protein 01/09/2023 8.5 (H)  6.5 - 8.1 g/dL Final   Albumin 01/09/2023 3.9  3.5 - 5.0 g/dL Final   AST 01/09/2023 91 (H)  15 - 41 U/L Final   ALT 01/09/2023 69 (H)  0 - 44 U/L Final   Alkaline Phosphatase 01/09/2023 79  38 - 126 U/L Final   Total Bilirubin 01/09/2023 2.1 (H)  0.3 - 1.2 mg/dL Final   GFR, Estimated 01/09/2023 >60  >60 mL/min Final   Comment: (NOTE) Calculated using the CKD-EPI Creatinine Equation (2021)    Anion gap 01/09/2023 11  5 - 15 Final   Performed at [facility], [place] [address] [place], [place] [address]   Alcohol, Ethyl (B) 01/09/2023 258 (H)  <10 mg/dL Final   Comment: (NOTE) Lowest detectable limit for serum alcohol is 10 mg/dL.  For medical purposes only. Performed at [facility], [place] [address] [place], [place] [id number]    Salicylate Lvl [id number] <7.0 (L)  7.0 - 30.0 mg/dL Final   Performed at [place] [address] [place], [place] [address]   Acetaminophen (Tylenol), Serum 01/09/2023 <10 (L)  10 - 30 ug/mL Final   Comment: (NOTE) Therapeutic concentrations vary significantly. A range of 10-30 ug/mL  may be an effective concentration for many patients. However, some  are best treated at concentrations outside of this range. Acetaminophen concentrations >150 ug/mL at 4 hours after ingestion  and >50 ug/mL at 12 hours after ingestion are often associated with  toxic reactions.  Performed at [facility],  [place]  [address], [place], [place] [address]    WBC 01/09/2023 4.3  4.0 - 10.5 K/uL Final   RBC 01/09/2023 5.64  4.22 - 5.81 MIL/uL Final   Hemoglobin 01/09/2023 13.2  13.0 - 17.0 g/dL Final   HCT 01/09/2023 43.0  39.0 - 52.0 % Final   MCV 01/09/2023 76.2 (L)  80.0 - 100.0 fL Final   MCH 01/09/2023 23.4 (L)  26.0 - 34.0 pg Final   MCHC 01/09/2023 30.7  30.0 - 36.0 g/dL Final   RDW 01/09/2023 18.7 (H)  11.5 - 15.5 % Final   Platelets 01/09/2023 96 (L)  150 - 400 K/uL Final   Comment: SPECIMEN CHECKED FOR CLOTS REPEATED TO VERIFY PLATELET COUNT CONFIRMED BY SMEAR    nRBC 01/09/2023 0.0  0.0 - 0.2 % Final   Performed at [facility], [place] [address] [place], [place] [address]   Opiates 01/09/2023 NONE DETECTED  NONE DETECTED Final   Cocaine 01/09/2023 NONE DETECTED  NONE DETECTED Final   Benzodiazepines 01/09/2023 NONE DETECTED  NONE DETECTED Final   Amphetamines 01/09/2023 NONE DETECTED  NONE DETECTED Final   Tetrahydrocannabinol 01/09/2023 NONE DETECTED  NONE DETECTED Final   Barbiturates 01/09/2023 NONE DETECTED  NONE DETECTED Final   Comment: (NOTE) DRUG SCREEN FOR MEDICAL PURPOSES ONLY.  IF CONFIRMATION IS NEEDED FOR ANY PURPOSE, NOTIFY LAB WITHIN 5 DAYS.  LOWEST DETECTABLE LIMITS FOR URINE DRUG SCREEN Drug Class                     Cutoff (ng/mL) Amphetamine and metabolites    1000 Barbiturate and metabolites    200 Benzodiazepine                 200 Opiates and metabolites        300 Cocaine and metabolites        300 THC                            50 Performed at [facility], [place] [address], [place], [place] [address]    SARS Coronavirus 2 by RT PCR 01/09/2023 NEGATIVE  NEGATIVE Final   Comment: (NOTE) SARS-CoV-2 target nucleic acids are NOT DETECTED.  The SARS-CoV-2 RNA is generally detectable in upper respiratory specimens during the acute phase of infection. The lowest concentration of SARS-CoV-2 viral copies this assay can  detect is 138 copies/mL. A negative result does not preclude SARS-Cov-2 infection and should not be used as the sole basis for treatment or other patient management decisions. A negative result may occur with  improper specimen collection/handling, submission of specimen other than nasopharyngeal swab, presence of viral mutation(s) within the areas targeted by this assay, and inadequate number of viral copies(<138 copies/mL). A negative result must be combined with clinical observations, patient history, and epidemiological information. The expected result is Negative.  Fact Sheet for Patients:  EntrepreneurPulse.com.au  Fact Sheet for Healthcare Providers:  IncredibleEmployment.be  This test is no                          t yet approved or cleared by the [place] FDA and  has been authorized for detection and/or diagnosis of SARS-CoV-2 by FDA under an Emergency Use Authorization (EUA). This EUA will remain  in effect (meaning this test can be used) for the duration of the COVID-19 declaration under Section 564(b)(1) of the Act, 21 U.S.C.section 360bbb-3(b)(1),  unless the authorization is terminated  or revoked sooner.       Influenza A by PCR 01/09/2023 NEGATIVE  NEGATIVE Final   Influenza B by PCR 01/09/2023 NEGATIVE  NEGATIVE Final   Comment: (NOTE) The Xpert Xpress SARS-CoV-2/FLU/RSV plus assay is intended as an aid in the diagnosis of influenza from Nasopharyngeal swab specimens and should not be used as a sole basis for treatment. Nasal washings and aspirates are unacceptable for Xpert Xpress SARS-CoV-2/FLU/RSV testing.  Fact Sheet for Patients: EntrepreneurPulse.com.au  Fact Sheet for Healthcare Providers: IncredibleEmployment.be  This test is not yet approved or cleared by the [place] FDA and has been authorized for detection and/or diagnosis of SARS-CoV-2 by FDA under an Emergency  Use Authorization (EUA). This EUA will remain in effect (meaning this test can be used) for the duration of the COVID-19 declaration under Section 564(b)(1) of the Act, 21 U.S.C. section 360bbb-3(b)(1), unless the authorization is terminated or revoked.     Resp Syncytial Virus by PCR 01/09/2023 NEGATIVE  NEGATIVE Final   Comment: (NOTE) Fact Sheet for Patients: EntrepreneurPulse.com.au  Fact Sheet for Healthcare Providers: IncredibleEmployment.be  This test is not yet approved or cleared by the [place] FDA and has been authorized for detection and/or diagnosis of SARS-CoV-2 by FDA under an Emergency Use Authorization (EUA). This EUA will remain in effect (meaning this test can be used) for the duration of the COVID-19 declaration under Section 564(b)(1) of the Act, 21 U.S.C. section 360bbb-3(b)(1), unless the authorization is terminated or revoked.  Performed at [facility], [place] [address], [place], [place] [address]    Cholesterol 01/09/2023 174  0 - 200 mg/dL Final   Triglycerides 01/09/2023 53  <150 mg/dL Final   HDL 01/09/2023 92  >40 mg/dL Final   Total CHOL/HDL Ratio 01/09/2023 1.9  RATIO Final   VLDL 01/09/2023 11  0 - 40 mg/dL Final   LDL Cholesterol 01/09/2023 71  0 - 99 mg/dL Final   Comment:        Total Cholesterol/HDL:CHD Risk Coronary Heart Disease Risk Table                     Men   Women  1/2 Average Risk   3.4   3.3  Average Risk       5.0   4.4  2 X Average Risk   9.6   7.1  3 X Average Risk  23.4   11.0        Use the calculated Patient Ratio above and the CHD Risk Table to determine the patient's CHD Risk.        ATP III CLASSIFICATION (LDL):  <100     mg/dL   Optimal  100-129  mg/dL   Near or Above                    Optimal  130-159  mg/dL   Borderline  160-189  mg/dL   High  >190     mg/dL   Very High Performed at [place] [address], [place],  [place] [address]    TSH 01/09/2023 2.744  0.350 - 4.500 uIU/mL Final   Comment: Performed by a 3rd Generation assay with a functional sensitivity of <=0.01 uIU/mL. Performed at [facility], [place] [address], [place], [place] [address]    Magnesium 01/09/2023 2.1  1.7 - 2.4 mg/dL Final   Performed at [place]  [address], [place], [place] [address]   Color, Urine 01/09/2023 STRAW (A)  YELLOW Final   APPearance 01/09/2023 CLEAR  CLEAR Final   Specific Gravity, Urine 01/09/2023 1.002 (L)  1.005 - 1.030 Final   pH 01/09/2023 6.0  5.0 - 8.0 Final   Glucose, UA 01/09/2023 NEGATIVE  NEGATIVE mg/dL Final   Hgb urine dipstick 01/09/2023 NEGATIVE  NEGATIVE Final   Bilirubin Urine 01/09/2023 NEGATIVE  NEGATIVE Final   Ketones, ur 01/09/2023 NEGATIVE  NEGATIVE mg/dL Final   Protein, ur 01/09/2023 NEGATIVE  NEGATIVE mg/dL Final   Nitrite 01/09/2023 NEGATIVE  NEGATIVE Final   Leukocytes,Ua 01/09/2023 NEGATIVE  NEGATIVE Final   Performed at [place] [address] [place] [address] [place], [place] [address]    Allergies: Carrot [daucus carota]  Medications:  Facility Ordered Medications  Medication   [COMPLETED] thiamine (VITAMIN B1) injection 100 mg   [COMPLETED] gabapentin (NEURONTIN) capsule 300 mg   acetaminophen (TYLENOL) tablet 650 mg   alum & mag hydroxide-simeth (MAALOX/MYLANTA) 200-200-20 MG/5ML suspension 30 mL   magnesium hydroxide (MILK OF MAGNESIA) suspension 30 mL   thiamine (VITAMIN B1) injection 100 mg   [START ON 01/11/2023] thiamine (VITAMIN B1) tablet 100 mg   multivitamin with minerals tablet 1 tablet   LORazepam (ATIVAN) tablet 1 mg   hydrOXYzine (ATARAX) tablet 25 mg   loperamide (IMODIUM) capsule 2-4 mg   ondansetron (ZOFRAN-ODT) disintegrating tablet 4 mg   LORazepam (ATIVAN) tablet 1 mg   Followed by   Derrill Memo ON 01/11/2023] LORazepam (ATIVAN) tablet 1 mg   Followed by   Derrill Memo ON 01/12/2023] LORazepam (ATIVAN) tablet 1 mg    Followed by   Derrill Memo ON 01/13/2023] LORazepam (ATIVAN) tablet 1 mg   OLANZapine zydis (ZYPREXA) disintegrating tablet 5 mg   And   ziprasidone (GEODON) injection 20 mg   PTA Medications  Medication Sig   amLODipine (NORVASC) 10 MG tablet Take 1 tablet (10 mg total) by mouth daily. (Patient not taking: Reported on 01/09/2023)   ferrous sulfate 325 (65 FE) MG tablet Take 1 tablet (325 mg total) by mouth daily with breakfast. (Patient not taking: Reported on 01/09/2023)   thiamine (VITAMIN B-1) 100 MG tablet Take 1 tablet (100 mg total) by mouth daily. (Patient not taking: Reported on 01/09/2023)   Multiple Vitamin (MULTIVITAMIN WITH MINERALS) TABS tablet Take 1 tablet by mouth daily. (Patient not taking: Reported on 01/09/2023)   gabapentin (NEURONTIN) 300 MG capsule Take 1 capsule (300 mg total) by mouth 2 (two) times daily. (Patient not taking: Reported on 01/09/2023)   dicyclomine (BENTYL) 20 MG tablet Take 1 tablet (20 mg total) by mouth 2 (two) times daily as needed (abdominal pain). (Patient not taking: Reported on 01/09/2023)   metroNIDAZOLE (FLAGYL) 500 MG tablet Take 1 tablet (500 mg total) by mouth 3 (three) times daily. (Patient not taking: Reported on 01/09/2023)    Long Term Goals:   Short Term Goals: Patient will attend at least of 50% of the groups daily., Pt will complete the PHQ9 on admission, day 3 and discharge., Patient will participate in completing the [place], Patient will score a low risk of violence for 24 hours prior to discharge, and Patient will take medications as prescribed daily.  Medical Decision Making  Inpatient FBC Lab Orders         SARS Coronavirus 2 by RT PCR (hospital order, performed in [facility] hospital lab) *cepheid single result test* Anterior Nasal Swab      Meds  ordered this encounter  Medications   acetaminophen (TYLENOL) tablet 650 mg   alum & mag hydroxide-simeth (MAALOX/MYLANTA) 200-200-20 MG/5ML suspension 30 mL    magnesium hydroxide (MILK OF MAGNESIA) suspension 30 mL   thiamine (VITAMIN B1) injection 100 mg   thiamine (VITAMIN B1) tablet 100 mg   multivitamin with minerals tablet 1 tablet   LORazepam (ATIVAN) tablet 1 mg   hydrOXYzine (ATARAX) tablet 25 mg   loperamide (IMODIUM) capsule 2-4 mg   ondansetron (ZOFRAN-ODT) disintegrating tablet 4 mg   FOLLOWED BY Linked Order Group    LORazepam (ATIVAN) tablet 1 mg    LORazepam (ATIVAN) tablet 1 mg    LORazepam (ATIVAN) tablet 1 mg    LORazepam (ATIVAN) tablet 1 mg   AND Linked Order Group    OLANZapine zydis (ZYPREXA) disintegrating tablet 5 mg    ziprasidone (GEODON) injection 20 mg       Recommendations  Based on my evaluation the patient appears to have an emergency medical condition for which I recommend the patient be transferred to the emergency department for further evaluation.  Evette Georges, NP 01/10/23  5:29 AM

## 2023-01-10 NOTE — ED Notes (Addendum)
Pt admitted to [facility] endorsing SI, HI and ETOH use. Patient stated does not feel suicidal and does not have any plan as well. Patient was cooperative during the admission assessment. Skin assessment complete. Patient oriented to unit and unit rules. Meal and drinks offered to patient.  Patient verbalized agreement to treatment plans. Patient verbally contracts for safety while hospitalized. Will monitor for safety.

## 2023-01-10 NOTE — ED Notes (Signed)
Patient was provided with breakfast 

## 2023-01-10 NOTE — ED Notes (Signed)
Patient was provided with dinner

## 2023-01-11 DIAGNOSIS — F1010 Alcohol abuse, uncomplicated: Secondary | ICD-10-CM | POA: Diagnosis not present

## 2023-01-11 DIAGNOSIS — F10129 Alcohol abuse with intoxication, unspecified: Secondary | ICD-10-CM | POA: Diagnosis not present

## 2023-01-11 LAB — PROLACTIN: Prolactin: 27.2 ng/mL — ABNORMAL HIGH (ref 3.6–25.2)

## 2023-01-11 MED ORDER — AMLODIPINE BESYLATE 10 MG PO TABS: 10.0000 mg | ORAL_TABLET | Freq: Every day | ORAL | 0 refills | Status: DC

## 2023-01-11 MED ORDER — LACTULOSE 10 GM/15ML PO SOLN
30.0000 g | Freq: Every day | ORAL | Status: DC
  Administered 2023-01-11: 30 g via ORAL
  Filled 2023-01-11 (×2): qty 45

## 2023-01-11 MED ORDER — CLOTRIMAZOLE 1 % EX CREA
TOPICAL_CREAM | Freq: Two times a day (BID) | CUTANEOUS | Status: DC
  Administered 2023-01-11: 1 via TOPICAL
  Filled 2023-01-11 (×2): qty 15

## 2023-01-11 MED ORDER — CLOTRIMAZOLE 1 % EX CREA: TOPICAL_CREAM | Freq: Two times a day (BID) | CUTANEOUS | 0 refills | Status: DC

## 2023-01-11 MED ORDER — QUETIAPINE FUMARATE ER 50 MG PO TB24: 100.0000 mg | ORAL_TABLET | Freq: Every day | ORAL | Status: DC

## 2023-01-11 MED ORDER — AMLODIPINE BESYLATE 10 MG PO TABS: 10.0000 mg | ORAL_TABLET | Freq: Once | ORAL | Status: DC

## 2023-01-11 MED ORDER — GABAPENTIN 300 MG PO CAPS: 300.0000 mg | ORAL_CAPSULE | Freq: Three times a day (TID) | ORAL | 0 refills | Status: DC

## 2023-01-11 MED ORDER — AMLODIPINE BESYLATE 10 MG PO TABS
10.0000 mg | ORAL_TABLET | Freq: Every day | ORAL | Status: DC
  Administered 2023-01-11 – 2023-01-12 (×2): 10 mg via ORAL
  Filled 2023-01-11 (×2): qty 1

## 2023-01-11 NOTE — Discharge Planning (Signed)
LCSW received update from patient that he is no longer interested in residential placement at this time. Patient reports his "girlfriend is in the [place] alone and scared and he does not want to leave her alone". Patient reports he has already informed the MD that he will be leaving. Information confirmed by MD and plan is to discharge patient on tomorrow. LCSW will follow up with pending facilities to inform. No other needs to report at this time.   Lucius Conn, LCSW Clinical Social Worker [place] [facility] Ph: [phone]

## 2023-01-11 NOTE — Discharge Planning (Signed)
Referral were sent to the following facilities for review:   [facility] in [place]: currently under review as agency reports biliruben in elevated; medical team discussing referral and will follow back up.  [facility] in [place]: patient has a phone screening at 6:00pm on tonight for possible admission; Agency will call unit number [phone].  [place]: unable to accept patient's insurance since he is already receiving detox at the [facility].   Patient provided an update. LCSW will continue to follow and provide updates as received.   Lucius Conn, LCSW Clinical Social Worker [place] [facility] Ph: [phone]

## 2023-01-11 NOTE — ED Notes (Signed)
Patient is sleeping. Respirations equal and unlabored, skin warm and dry, NAD. No change in assessment or acuity. Routine safety checks conducted according to facility protocol. Will continue to monitor for safety.   

## 2023-01-11 NOTE — ED Notes (Signed)
Patient A&Ox4. Denies intent to harm self/others when asked. Denies A/VH. Patient denies any physical complaints when asked. Pt states, "I drink too much. I drank for 6 mths straight. I need help". Support and encouragement provided. Routine safety checks conducted according to facility protocol. Encouraged patient to notify staff if thoughts of harm toward self or others arise. Patient verbalize understanding and agreement. Will continue to monitor for safety.

## 2023-01-11 NOTE — Discharge Instructions (Signed)
[facility] [place], [place], [address] [phone] phone  New Patient Assessment/Therapy Walk-Ins:  Monday and Wednesday: 8 am until slots are full. Every 1st and 2nd Fridays of the month: 1 pm - 5 pm.  NO ASSESSMENT/THERAPY WALK-INS ON [place]  New Patient Assessment/Medication Management Walk-Ins:  Monday - Friday:  8 am - 11 am.  For all walk-ins, we ask that you arrive by 7:30 am because patients will be seen in the order of arrival.  Availability is limited; therefore, you may not be seen on the same day that you walk-in.  Our goal is to serve and meet the needs of our community to the best of our ability.  HALFWAY HOUSES:  [employer] [facility] [place] [phone]  [employer].oxfordvacancies.com  12 STEP PROGRAMS:  Alcoholics Anonymous of [place] ReportZoo.com.cy  Narcotics Anonymous of [place] GreenScrapbooking.dk  Al-Anon of [employer], [place] www.[place]alanon.org/find-meetings.html  Nar-Anon https://nar-anon.org/find-a-meetin  Residential Programs:  [facility]: [place], [place] or [phone]; Admissions [phone] [facility] in [place], [place]: [phone]; [employer]: [phone]

## 2023-01-11 NOTE — ED Notes (Signed)
Pt sitting in dayroom interacting with peers. No acute distress noted. No concerns voiced. Informed pt to notify staff with any needs or assistance. Pt verbalized understanding or agreement. Will continue to monitor for safety. 

## 2023-01-11 NOTE — ED Notes (Signed)
Pt sleeping in no acute distress. RR even and unlabored. Environment secured. Will continue to monitor for safety. 

## 2023-01-11 NOTE — ED Provider Notes (Signed)
Behavioral Health Progress Note  Date and Time: 01/11/2023 11:01 AM Name: [name] MRN:  [id number]  Subjective:   [name] is a 67 year old male with a psychiatric history of bipolar spectrum disorder alcohol use disorder who presented voluntarily from [facility] for alcohol detox, for which he was admitted to [facility].  On assessment, the patient reports that he is feeling very tired and reports trouble staying asleep last night.  He endorses "jitters" and headache today.  Denies nausea and vomiting.  He complains of very low energy, feeling down, and pain on his feet from tinea pedis and is requesting medication.    Diagnosis:  Final diagnoses:  Alcohol abuse with intoxication ([place])  Homelessness unspecified    Total Time spent with patient: 20 minutes  Past Psychiatric History: bipolar disorder and multiple psychiatric hospitalization "[name] and [name]."  Past Medical History: see chart Family History: see chart Family Psychiatric  History: see H&P Social History: Lives in a tent off of [address]. Receives over $990 in disability monthly.   Sleep: Poor  Appetite:  Good  Current Medications:  Current Facility-Administered Medications  Medication Dose Route Frequency Provider Last Rate Last Admin   acetaminophen (TYLENOL) tablet 650 mg  650 mg Oral Q6H PRN Evette Georges, NP   650 mg at 01/10/23 2104   alum & mag hydroxide-simeth (MAALOX/MYLANTA) 200-200-20 MG/5ML suspension 30 mL  30 mL Oral Q4H PRN Evette Georges, NP       amLODipine (NORVASC) tablet 10 mg  10 mg Oral Daily Rosezetta Schlatter, MD       clotrimazole (LOTRIMIN) 1 % cream   Topical BID Rosezetta Schlatter, MD       dicyclomine (BENTYL) tablet 20 mg  20 mg Oral BID PRN Evette Georges, NP       gabapentin (NEURONTIN) capsule 300 mg  300 mg Oral TID Evette Georges, NP   300 mg at 01/11/23 0912   hydrOXYzine (ATARAX) tablet 25 mg  25 mg Oral Q6H PRN Evette Georges, NP   25 mg at 01/10/23 2104   lactulose  (CHRONULAC) 10 GM/15ML solution 30 g  30 g Oral Daily Rosezetta Schlatter, MD       loperamide (IMODIUM) capsule 2-4 mg  2-4 mg Oral PRN Evette Georges, NP       LORazepam (ATIVAN) tablet 1 mg  1 mg Oral Q6H PRN Evette Georges, NP       LORazepam (ATIVAN) tablet 1 mg  1 mg Oral TID Evette Georges, NP   1 mg at 01/11/23 [id number]   Followed by   Derrill Memo ON 01/12/2023] LORazepam (ATIVAN) tablet 1 mg  1 mg Oral BID Evette Georges, NP       Followed by   Derrill Memo ON 01/13/2023] LORazepam (ATIVAN) tablet 1 mg  1 mg Oral Daily Evette Georges, NP       magnesium hydroxide (MILK OF MAGNESIA) suspension 30 mL  30 mL Oral Daily PRN Evette Georges, NP       metroNIDAZOLE (FLAGYL) tablet 500 mg  500 mg Oral TID Evette Georges, NP   500 mg at 01/11/23 [id number]   multivitamin with minerals tablet 1 tablet  1 tablet Oral Daily Evette Georges, NP   1 tablet at 01/11/23 0912   OLANZapine zydis (ZYPREXA) disintegrating tablet 5 mg  5 mg Oral Q8H PRN Evette Georges, NP       And   ziprasidone (GEODON) injection 20 mg  20 mg Intramuscular PRN Evette Georges, NP  ondansetron (ZOFRAN-ODT) disintegrating tablet 4 mg  4 mg Oral Q6H PRN Evette Georges, NP       QUEtiapine (SEROQUEL XR) 24 hr tablet 100 mg  100 mg Oral QHS Rosezetta Schlatter, MD       thiamine (VITAMIN B1) tablet 100 mg  100 mg Oral Daily Evette Georges, NP   100 mg at 01/11/23 [id number]   Current Outpatient Medications  Medication Sig Dispense Refill   amLODipine (NORVASC) 10 MG tablet Take 1 tablet (10 mg total) by mouth daily. (Patient not taking: Reported on 01/09/2023) 30 tablet 0   dicyclomine (BENTYL) 20 MG tablet Take 1 tablet (20 mg total) by mouth 2 (two) times daily as needed (abdominal pain). (Patient not taking: Reported on 01/09/2023) 10 tablet 0   ferrous sulfate 325 (65 FE) MG tablet Take 1 tablet (325 mg total) by mouth daily with breakfast. (Patient not taking: Reported on 01/09/2023) 30 tablet 0   gabapentin (NEURONTIN) 300 MG capsule Take 1 capsule (300 mg total) by  mouth 2 (two) times daily. (Patient not taking: Reported on 01/09/2023) 60 capsule 0   gabapentin (NEURONTIN) 300 MG capsule Take 300 mg by mouth 3 (three) times daily.     metroNIDAZOLE (FLAGYL) 500 MG tablet Take 1 tablet (500 mg total) by mouth 3 (three) times daily. (Patient not taking: Reported on 01/09/2023) 21 tablet 0   Multiple Vitamin (MULTIVITAMIN WITH MINERALS) TABS tablet Take 1 tablet by mouth daily. (Patient not taking: Reported on 01/09/2023)     thiamine (VITAMIN B-1) 100 MG tablet Take 1 tablet (100 mg total) by mouth daily. (Patient not taking: Reported on 01/09/2023)      Labs  Lab Results:  Admission on 01/09/2023, Discharged on 01/09/2023  Component Date Value Ref Range Status   Sodium 01/09/2023 135  135 - 145 mmol/L Final   Potassium 01/09/2023 3.8  3.5 - 5.1 mmol/L Final   Chloride 01/09/2023 97 (L)  98 - 111 mmol/L Final   CO2 01/09/2023 27  22 - 32 mmol/L Final   Glucose, Bld 01/09/2023 101 (H)  70 - 99 mg/dL Final   Glucose reference range applies only to samples taken after fasting for at least 8 hours.   BUN 01/09/2023 7 (L)  8 - 23 mg/dL Final   Creatinine, Ser 01/09/2023 0.77  0.61 - 1.24 mg/dL Final   Calcium 01/09/2023 8.9  8.9 - 10.3 mg/dL Final   Total Protein 01/09/2023 8.5 (H)  6.5 - 8.1 g/dL Final   Albumin 01/09/2023 3.9  3.5 - 5.0 g/dL Final   AST 01/09/2023 91 (H)  15 - 41 U/L Final   ALT 01/09/2023 69 (H)  0 - 44 U/L Final   Alkaline Phosphatase 01/09/2023 79  38 - 126 U/L Final   Total Bilirubin 01/09/2023 2.1 (H)  0.3 - 1.2 mg/dL Final   GFR, Estimated 01/09/2023 >60  >60 mL/min Final   Comment: (NOTE) Calculated using the CKD-EPI Creatinine Equation (2021)    Anion gap 01/09/2023 11  5 - 15 Final   Performed at [facility], [place] [address] [place], [place] [address]   Alcohol, Ethyl (B) 01/09/2023 258 (H)  <10 mg/dL Final   Comment: (NOTE) Lowest detectable limit for serum alcohol is 10 mg/dL.  For medical purposes  only. Performed at [facility], [place] [address], [place], [place] [id number]    Salicylate Lvl [id number] <7.0 (L)  7.0 - 30.0 mg/dL Final   Performed at [facility], [place] [address], [place], [place] [address]   Acetaminophen (Tylenol), Serum 01/09/2023 <10 (L)  10 - 30 ug/mL Final   Comment: (NOTE) Therapeutic concentrations vary significantly. A range of 10-30 ug/mL  may be an effective concentration for many patients. However, some  are best treated at concentrations outside of this range. Acetaminophen concentrations >150 ug/mL at 4 hours after ingestion  and >50 ug/mL at 12 hours after ingestion are often associated with  toxic reactions.  Performed at [facility], [place] [address] [place], [place] [address]    WBC 01/09/2023 4.3  4.0 - 10.5 K/uL Final   RBC 01/09/2023 5.64  4.22 - 5.81 MIL/uL Final   Hemoglobin 01/09/2023 13.2  13.0 - 17.0 g/dL Final   HCT 01/09/2023 43.0  39.0 - 52.0 % Final   MCV 01/09/2023 76.2 (L)  80.0 - 100.0 fL Final   MCH 01/09/2023 23.4 (L)  26.0 - 34.0 pg Final   MCHC 01/09/2023 30.7  30.0 - 36.0 g/dL Final   RDW 01/09/2023 18.7 (H)  11.5 - 15.5 % Final   Platelets 01/09/2023 96 (L)  150 - 400 K/uL Final   Comment: SPECIMEN CHECKED FOR CLOTS REPEATED TO VERIFY PLATELET COUNT CONFIRMED BY SMEAR    nRBC 01/09/2023 0.0  0.0 - 0.2 % Final   Performed at [facility], [place] [address] [place], [place] [address]   Opiates 01/09/2023 NONE DETECTED  NONE DETECTED Final   Cocaine 01/09/2023 NONE DETECTED  NONE DETECTED Final   Benzodiazepines 01/09/2023 NONE DETECTED  NONE DETECTED Final   Amphetamines 01/09/2023 NONE DETECTED  NONE DETECTED Final   Tetrahydrocannabinol 01/09/2023 NONE DETECTED  NONE DETECTED Final   Barbiturates 01/09/2023 NONE DETECTED  NONE DETECTED Final   Comment: (NOTE) DRUG SCREEN FOR MEDICAL PURPOSES ONLY.  IF CONFIRMATION IS NEEDED FOR ANY PURPOSE,  NOTIFY LAB WITHIN 5 DAYS.  LOWEST DETECTABLE LIMITS FOR URINE DRUG SCREEN Drug Class                     Cutoff (ng/mL) Amphetamine and metabolites    1000 Barbiturate and metabolites    200 Benzodiazepine                 200 Opiates and metabolites        300 Cocaine and metabolites        300 THC                            50 Performed at [facility], [place] [address] [place], [place] [address]    SARS Coronavirus 2 by RT PCR 01/09/2023 NEGATIVE  NEGATIVE Final   Comment: (NOTE) SARS-CoV-2 target nucleic acids are NOT DETECTED.  The SARS-CoV-2 RNA is generally detectable in upper respiratory specimens during the acute phase of infection. The lowest concentration of SARS-CoV-2 viral copies this assay can detect is 138 copies/mL. A negative result does not preclude SARS-Cov-2 infection and should not be used as the sole basis for treatment or other patient management decisions. A negative result may occur with  improper specimen collection/handling, submission of specimen other than nasopharyngeal swab, presence of viral mutation(s) within the areas targeted by this assay, and inadequate number of viral copies(<138 copies/mL). A negative result must be combined with clinical observations, patient history, and epidemiological information. The expected result is Negative.  Fact Sheet for Patients:  EntrepreneurPulse.com.au  Fact Sheet for Healthcare Providers:  IncredibleEmployment.be  This test is no                          t yet approved or cleared by the [place] and  has been authorized for detection and/or diagnosis of SARS-CoV-2 by FDA under an Emergency Use Authorization (EUA). This EUA will remain  in effect (meaning this test can be used) for the duration of the COVID-19 declaration under Section 564(b)(1) of the Act, 21 U.S.C.section 360bbb-3(b)(1), unless the authorization is terminated  or revoked  sooner.       Influenza A by PCR 01/09/2023 NEGATIVE  NEGATIVE Final   Influenza B by PCR 01/09/2023 NEGATIVE  NEGATIVE Final   Comment: (NOTE) The Xpert Xpress SARS-CoV-2/FLU/RSV plus assay is intended as an aid in the diagnosis of influenza from Nasopharyngeal swab specimens and should not be used as a sole basis for treatment. Nasal washings and aspirates are unacceptable for Xpert Xpress SARS-CoV-2/FLU/RSV testing.  Fact Sheet for Patients: EntrepreneurPulse.com.au  Fact Sheet for Healthcare Providers: IncredibleEmployment.be  This test is not yet approved or cleared by the [place] FDA and has been authorized for detection and/or diagnosis of SARS-CoV-2 by FDA under an Emergency Use Authorization (EUA). This EUA will remain in effect (meaning this test can be used) for the duration of the COVID-19 declaration under Section 564(b)(1) of the Act, 21 U.S.C. section 360bbb-3(b)(1), unless the authorization is terminated or revoked.     Resp Syncytial Virus by PCR 01/09/2023 NEGATIVE  NEGATIVE Final   Comment: (NOTE) Fact Sheet for Patients: EntrepreneurPulse.com.au  Fact Sheet for Healthcare Providers: IncredibleEmployment.be  This test is not yet approved or cleared by the [place] FDA and has been authorized for detection and/or diagnosis of SARS-CoV-2 by FDA under an Emergency Use Authorization (EUA). This EUA will remain in effect (meaning this test can be used) for the duration of the COVID-19 declaration under Section 564(b)(1) of the Act, 21 U.S.C. section 360bbb-3(b)(1), unless the authorization is terminated or revoked.  Performed at [facility], [place] [address] [place], [place] [address]    Cholesterol 01/09/2023 174  0 - 200 mg/dL Final   Triglycerides 01/09/2023 53  <150 mg/dL Final   HDL 01/09/2023 92  >40 mg/dL Final   Total CHOL/HDL Ratio 01/09/2023 1.9   RATIO Final   VLDL 01/09/2023 11  0 - 40 mg/dL Final   LDL Cholesterol 01/09/2023 71  0 - 99 mg/dL Final   Comment:        Total Cholesterol/HDL:CHD Risk Coronary Heart Disease Risk Table                     Men   Women  1/2 Average Risk   3.4   3.3  Average Risk       5.0   4.4  2 X Average Risk   9.6   7.1  3 X Average Risk  23.4   11.0        Use the calculated Patient Ratio above and the CHD Risk Table to determine the patient's CHD Risk.        ATP III CLASSIFICATION (LDL):  <100     mg/dL   Optimal  100-129  mg/dL   Near or Above                    Optimal  130-159  mg/dL   Borderline  160-189  mg/dL  High  >190     mg/dL   Very High Performed at [place] [address] [place], [place] [address]    TSH 01/09/2023 2.744  0.350 - 4.500 uIU/mL Final   Comment: Performed by a 3rd Generation assay with a functional sensitivity of <=0.01 uIU/mL. Performed at [facility], [place] [address], [place], [place] [address]    Magnesium 01/09/2023 2.1  1.7 - 2.4 mg/dL Final   Performed at [place] [address], [place], [place] [address]   Color, Urine 01/09/2023 STRAW (A)  YELLOW Final   APPearance 01/09/2023 CLEAR  CLEAR Final   Specific Gravity, Urine 01/09/2023 1.002 (L)  1.005 - 1.030 Final   pH 01/09/2023 6.0  5.0 - 8.0 Final   Glucose, UA 01/09/2023 NEGATIVE  NEGATIVE mg/dL Final   Hgb urine dipstick 01/09/2023 NEGATIVE  NEGATIVE Final   Bilirubin Urine 01/09/2023 NEGATIVE  NEGATIVE Final   Ketones, ur 01/09/2023 NEGATIVE  NEGATIVE mg/dL Final   Protein, ur 01/09/2023 NEGATIVE  NEGATIVE mg/dL Final   Nitrite 01/09/2023 NEGATIVE  NEGATIVE Final   Leukocytes,Ua 01/09/2023 NEGATIVE  NEGATIVE Final   Performed at [place] [address] [place], [place] [address]   Prolactin 01/09/2023 27.2 (H)  3.6 - 25.2 ng/mL Final   Comment: (NOTE) Performed At: [facility] [place], [place] [id number] Rush Farmer MD [id number]     Blood Alcohol level:  Lab Results  Component Value Date   ETH 258 (H) 01/09/2023   ETH <10 [id number]    Metabolic Disorder Labs: Lab Results  Component Value Date   HGBA1C 5.2 05/24/2022   MPG 102.54 05/24/2022   MPG 108 05/06/2009   Lab Results  Component Value Date   PROLACTIN 27.2 (H) 01/09/2023   PROLACTIN 15.3 (H) 07/06/2022   Lab Results  Component Value Date   CHOL 174 01/09/2023   TRIG 53 01/09/2023   HDL 92 01/09/2023   CHOLHDL 1.9 01/09/2023   VLDL 11 01/09/2023   LDLCALC 71 01/09/2023   LDLCALC 68 05/31/2022    Therapeutic Lab Levels: Lab Results  Component Value Date   LITHIUM <0.06 (L) 02/09/2021   No results found for: "VALPROATE" Lab Results  Component Value Date   CBMZ <0.3 (L) 11/19/2010    Physical Findings   AUDIT    Flowsheet Row ED from 05/29/2022 in [facility]  Alcohol Use Disorder Identification Test Final Score (AUDIT) 14      CAGE-AID    Flowsheet Row ED to Hosp-Admission (Discharged) from 04/20/2022 in [facility] ED to Hosp-Admission (Discharged) from 02/07/2021 in [facility] ED to Hosp-Admission (Discharged) from 12/01/2020 in [place] Score 4 0 1      PHQ2-9    Flowsheet Row ED from 01/10/2023 in [facility] ED from 07/06/2022 in [facility] ED from 06/10/2022 in [facility] ED from 05/29/2022 in [facility] ED from 01/12/2022 in [facility]  PHQ-2 Total Score 4 0 6 6 2   PHQ-9 Total Score 19 23 21 17 7       Flowsheet Row ED from 01/10/2023 in [facility] ED from 01/09/2023 in [facility] Emergency Department at [facility] ED from 07/08/2022 in [facility] Emergency  Department at [place] No Risk No Risk No Risk        Musculoskeletal  Strength & Muscle Tone: within normal limits Gait & Station: normal Patient leans: N/A  Psychiatric Specialty Exam  Presentation  General Appearance:  Appropriate for Environment  Eye Contact: Good  Speech: Normal Rate  Speech Volume: Normal  Handedness: Right   Mood and Affect  Mood: "Feeling down"  Affect: Appropriate   Thought Process  Thought Processes: Coherent  Descriptions of Associations: Appropriate  Orientation:Full (Time, Place and Person)  Thought Content:Logical  Diagnosis of Schizophrenia or Schizoaffective disorder in past: No    Hallucinations:Hallucinations: None  Ideas of Reference:None  Suicidal Thoughts:Suicidal Thoughts: No SI Passive Intent and/or Plan: Without Intent  Homicidal Thoughts:Homicidal Thoughts: No HI Passive Intent and/or Plan: Without Intent   Sensorium  Memory: Immediate Good; Recent Good; Remote Good  Judgment: Fair  Insight: Fair   Community education officer  Concentration: Good  Attention Span: Good  Recall: Good  Fund of Knowledge: Good  Language: Good   Psychomotor Activity  Psychomotor Activity: Psychomotor Activity: Normal   Assets  Assets: Desire for Improvement   Sleep  Sleep: Sleep: Poor Number of Hours of Sleep: 4   Nutritional Assessment (For OBS and FBC admissions only) Has the patient had a weight loss or gain of 10 pounds or more in the last 3 months?: No Has the patient had a decrease in food intake/or appetite?: Yes Does the patient have dental problems?: No Does the patient have eating habits or behaviors that may be indicators of an eating disorder including binging or inducing vomiting?: No Has the patient recently lost weight without trying?: 0 Has the patient been eating poorly because of a decreased appetite?: 1 Malnutrition Screening  Tool Score: 1    Physical Exam  Physical Exam Vitals reviewed.  Constitutional:      General: He is not in acute distress.    Appearance: He is not toxic-appearing.  Pulmonary:     Effort: No respiratory distress.  Skin:    Findings: Rash present.  Neurological:     Mental Status: He is oriented to person, place, and time.     Gait: Gait normal.    Review of Systems  Gastrointestinal:  Positive for constipation. Negative for nausea and vomiting.  Skin:  Positive for rash.  Neurological:  Positive for tremors and headaches.   Blood pressure (!) 163/75, pulse 62, temperature 97.9 F (36.6 C), temperature source Oral, resp. rate 20, SpO2 100 %. There is no height or weight on file to calculate BMI.  Treatment Plan Summary: Daily contact with patient to assess and evaluate symptoms and progress in treatment and Medication management.  Clotrimazole cream for tinea pedis.  Seroquel  100 mg QHS for Depression and sleep.  Restart Amlodipine 10 mg daily for hypertension.  Jaiyana Canale, [place] 01/11/2023 11:01 AM  Attestation for Student Documentation:  I personally was present and performed or re-performed the history, physical exam and medical decision-making activities of this service and have verified that the service and findings are accurately documented in the student's note.  Rosezetta Schlatter, MD 01/12/2023, 8:08 AM

## 2023-01-12 DIAGNOSIS — F1010 Alcohol abuse, uncomplicated: Secondary | ICD-10-CM

## 2023-01-12 DIAGNOSIS — F10129 Alcohol abuse with intoxication, unspecified: Secondary | ICD-10-CM | POA: Diagnosis not present

## 2023-01-12 NOTE — ED Notes (Signed)
Patient A&Ox4. Patient denies SI/Hi and AVH. Patient denies any physical complaints when asked. No acute S & S of withdrawal or distress noted. Support and encouragement provided. Routine safety checks conducted according to facility protocol. Encouraged patient to notify staff if thoughts of harm toward self or others arise. Patient verbalize understanding and agreement. Will continue to monitor for safety.

## 2023-01-12 NOTE — ED Notes (Signed)
Patient was eating breakfast this morning. Patient is requesting to to home today. Patient denies SI/HI and AVH. Patient is being monitored for safety.

## 2023-01-12 NOTE — ED Notes (Signed)
Patient reported he did not want his prescriptions.

## 2023-01-12 NOTE — ED Provider Notes (Signed)
FBC/OBS ASAP Discharge Summary  Date and Time: 01/12/2023 9:28 AM  Name: [name]  MRN:  [id number]   Discharge Diagnoses:  Final diagnoses:  Alcohol abuse with intoxication ([place])  Homelessness unspecified    Subjective:  [name] is a 67 year old male with a psychiatric history of bipolar spectrum disorder alcohol use disorder who presented voluntarily from [facility] for alcohol detox, for which he was admitted to [facility].   Stay Summary: The patient was evaluated each day by a clinical provider to ascertain response to treatment. Improvement was noted by the patient's report of decreasing symptoms and medication tolerance.  Patient was asked each day to complete a self inventory noting mood, mental status, pain, new symptoms, anxiety and concerns.   By the day of discharge patient was in somewhat improved condition than upon admission. He decided to leave AMA. The patient denied SI/HI and voiced no AVH.   Multiple members of staff tried to convince patient to stay to complete treatment and seek a higher level of care, but patient refused, stating that he needed to return to his tent for his girlfriend. Despite patient's unwillingness to engage, he did not meet criteria for IVC. It was discussed and he voiced understanding that if he continues to drink alcohol, his health will continue to deteriorate to the point of liver failure or even death.  Total Time spent with patient: 20 minutes  Past Psychiatric History: bipolar disorder and multiple psychiatric hospitalization "[facility]."  Past Medical History: see chart Family History: see chart Family Psychiatric  History: see H&P Social History: Lives in a tent off of [address]. Receives over $990 in disability monthly.  Tobacco Cessation:  N/A, patient does not currently use tobacco products  Current Medications:  Current Facility-Administered Medications  Medication Dose Route Frequency Provider Last Rate Last  Admin   acetaminophen (TYLENOL) tablet 650 mg  650 mg Oral Q6H PRN Evette Georges, NP   650 mg at 01/10/23 2104   alum & mag hydroxide-simeth (MAALOX/MYLANTA) 200-200-20 MG/5ML suspension 30 mL  30 mL Oral Q4H PRN Evette Georges, NP       amLODipine (NORVASC) tablet 10 mg  10 mg Oral Daily Rosezetta Schlatter, MD   10 mg at 01/12/23 [id number]   clotrimazole (LOTRIMIN) 1 % cream   Topical BID Rosezetta Schlatter, MD   1 Application at 0000000 2131   dicyclomine (BENTYL) tablet 20 mg  20 mg Oral BID PRN Evette Georges, NP       gabapentin (NEURONTIN) capsule 300 mg  300 mg Oral TID Evette Georges, NP   300 mg at 01/12/23 0908   hydrOXYzine (ATARAX) tablet 25 mg  25 mg Oral Q6H PRN Evette Georges, NP   25 mg at 01/10/23 2104   lactulose (CHRONULAC) 10 GM/15ML solution 30 g  30 g Oral Daily Rosezetta Schlatter, MD   30 g at 01/11/23 1204   loperamide (IMODIUM) capsule 2-4 mg  2-4 mg Oral PRN Evette Georges, NP       LORazepam (ATIVAN) tablet 1 mg  1 mg Oral Q6H PRN Evette Georges, NP       LORazepam (ATIVAN) tablet 1 mg  1 mg Oral BID Evette Georges, NP   1 mg at 01/12/23 [id number]   Followed by   Derrill Memo ON 01/13/2023] LORazepam (ATIVAN) tablet 1 mg  1 mg Oral Daily Evette Georges, NP       magnesium hydroxide (MILK OF MAGNESIA) suspension 30 mL  30 mL Oral Daily PRN  Evette Georges, NP       metroNIDAZOLE (FLAGYL) tablet 500 mg  500 mg Oral TID Evette Georges, NP   500 mg at 01/12/23 0908   multivitamin with minerals tablet 1 tablet  1 tablet Oral Daily Evette Georges, NP   1 tablet at 01/12/23 0909   OLANZapine zydis (ZYPREXA) disintegrating tablet 5 mg  5 mg Oral Q8H PRN Evette Georges, NP       And   ziprasidone (GEODON) injection 20 mg  20 mg Intramuscular PRN Evette Georges, NP       ondansetron (ZOFRAN-ODT) disintegrating tablet 4 mg  4 mg Oral Q6H PRN Evette Georges, NP       thiamine (VITAMIN B1) tablet 100 mg  100 mg Oral Daily Evette Georges, NP   100 mg at 01/12/23 0908   Current Outpatient Medications  Medication Sig  Dispense Refill   amLODipine (NORVASC) 10 MG tablet Take 1 tablet (10 mg total) by mouth daily. 30 tablet 0   clotrimazole (LOTRIMIN) 1 % cream Apply topically 2 (two) times daily. 30 g 0   gabapentin (NEURONTIN) 300 MG capsule Take 1 capsule (300 mg total) by mouth 3 (three) times daily. 90 capsule 0   Multiple Vitamin (MULTIVITAMIN WITH MINERALS) TABS tablet Take 1 tablet by mouth daily. (Patient not taking: Reported on 01/09/2023)     thiamine (VITAMIN B-1) 100 MG tablet Take 1 tablet (100 mg total) by mouth daily. (Patient not taking: Reported on 01/09/2023)      PTA Medications:  PTA Medications  Medication Sig   thiamine (VITAMIN B-1) 100 MG tablet Take 1 tablet (100 mg total) by mouth daily. (Patient not taking: Reported on 01/09/2023)   Multiple Vitamin (MULTIVITAMIN WITH MINERALS) TABS tablet Take 1 tablet by mouth daily. (Patient not taking: Reported on 01/09/2023)   amLODipine (NORVASC) 10 MG tablet Take 1 tablet (10 mg total) by mouth daily.   gabapentin (NEURONTIN) 300 MG capsule Take 1 capsule (300 mg total) by mouth 3 (three) times daily.   clotrimazole (LOTRIMIN) 1 % cream Apply topically 2 (two) times daily.   Facility Ordered Medications  Medication   [COMPLETED] thiamine (VITAMIN B1) injection 100 mg   [COMPLETED] gabapentin (NEURONTIN) capsule 300 mg   acetaminophen (TYLENOL) tablet 650 mg   alum & mag hydroxide-simeth (MAALOX/MYLANTA) 200-200-20 MG/5ML suspension 30 mL   magnesium hydroxide (MILK OF MAGNESIA) suspension 30 mL   [COMPLETED] thiamine (VITAMIN B1) injection 100 mg   thiamine (VITAMIN B1) tablet 100 mg   multivitamin with minerals tablet 1 tablet   LORazepam (ATIVAN) tablet 1 mg   hydrOXYzine (ATARAX) tablet 25 mg   loperamide (IMODIUM) capsule 2-4 mg   ondansetron (ZOFRAN-ODT) disintegrating tablet 4 mg   [COMPLETED] LORazepam (ATIVAN) tablet 1 mg   Followed by   [COMPLETED] LORazepam (ATIVAN) tablet 1 mg   Followed by   LORazepam (ATIVAN) tablet 1 mg    Followed by   Derrill Memo ON 01/13/2023] LORazepam (ATIVAN) tablet 1 mg   OLANZapine zydis (ZYPREXA) disintegrating tablet 5 mg   And   ziprasidone (GEODON) injection 20 mg   metroNIDAZOLE (FLAGYL) tablet 500 mg   gabapentin (NEURONTIN) capsule 300 mg   dicyclomine (BENTYL) tablet 20 mg   lactulose (CHRONULAC) 10 GM/15ML solution 30 g   clotrimazole (LOTRIMIN) 1 % cream   amLODipine (NORVASC) tablet 10 mg       01/12/2023    8:58 AM 01/10/2023   11:07 AM 07/08/2022    8:18 AM  Depression  screen PHQ 2/9  Decreased Interest 0 2 0  Down, Depressed, Hopeless 1 2 0  PHQ - 2 Score 1 4 0  Altered sleeping 3 3   Tired, decreased energy 1 3   Change in appetite 2 2   Feeling bad or failure about yourself  2 2   Trouble concentrating  3   Moving slowly or fidgety/restless 0    Suicidal thoughts 1 2   PHQ-9 Score 10 19     Flowsheet Row ED from 01/10/2023 in [facility] ED from 01/09/2023 in [facility] Emergency Department at [facility] ED from 07/08/2022 in [facility] Emergency Department at [place] No Risk No Risk No Risk       Musculoskeletal  Strength & Muscle Tone: within normal limits Gait & Station: normal Patient leans: N/A  Psychiatric Specialty Exam  Presentation  General Appearance:  Appropriate for Environment; Casual  Eye Contact: Good  Speech: Clear and Coherent; Normal Rate  Speech Volume: Normal  Handedness: Right   Mood and Affect  Mood: Euthymic; Irritable  Affect: Congruent   Thought Process  Thought Processes: Coherent; Goal Directed  Descriptions of Associations:Intact  Orientation:Full (Time, Place and Person)  Thought Content:WDL  Diagnosis of Schizophrenia or Schizoaffective disorder in past: No    Hallucinations:Hallucinations: None  Ideas of Reference:None  Suicidal Thoughts:Suicidal Thoughts: No SI Passive Intent and/or Plan: Without  Intent  Homicidal Thoughts:Homicidal Thoughts: No HI Passive Intent and/or Plan: Without Intent   Sensorium  Memory: Immediate Good; Recent Good  Judgment: Poor (Leaving AMA)  Insight: Fair   Community education officer  Concentration: Good  Attention Span: Good  Recall: Good  Fund of Knowledge: Good  Language: Good   Psychomotor Activity  Psychomotor Activity: Psychomotor Activity: Normal   Assets  Assets: Communication Skills; Financial Resources/Insurance   Sleep  Sleep: Sleep: Fair   Nutritional Assessment (For OBS and FBC admissions only) Has the patient had a weight loss or gain of 10 pounds or more in the last 3 months?: No Has the patient had a decrease in food intake/or appetite?: Yes Does the patient have dental problems?: No Does the patient have eating habits or behaviors that may be indicators of an eating disorder including binging or inducing vomiting?: No Has the patient recently lost weight without trying?: 0 Has the patient been eating poorly because of a decreased appetite?: 1 Malnutrition Screening Tool Score: 1    Physical Exam  Physical Exam Vitals reviewed.  Constitutional:      General: He is not in acute distress.    Appearance: He is not toxic-appearing.  Pulmonary:     Effort: No respiratory distress.  Skin:    Findings: Rash present.  Neurological:     Mental Status: He is oriented to person, place, and time.     Gait: Gait normal.      Review of Systems  Gastrointestinal:  Negative for constipation. Negative for nausea and vomiting.  Skin:  Positive for rash.  Neurological:  Positive for tremors and headaches.  Blood pressure (!) 168/94, pulse 64, temperature 98.7 F (37.1 C), temperature source Oral, resp. rate 17, SpO2 99 %. There is no height or weight on file to calculate BMI.  Demographic Factors:  Male, Age 39 or older, Caucasian, Low socioeconomic status, Unemployed, and homeless  Loss Factors: Decline  in physical health and Legal issues  Historical Factors: Prior suicide attempts and Impulsivity  Risk Reduction Factors:  NA  Continued Clinical Symptoms:  Bipolar Disorder:   Depressive phase Alcohol/Substance Abuse/Dependencies Unstable or Poor Therapeutic Relationship Previous Psychiatric Diagnoses and Treatments Medical Diagnoses and Treatments/Surgeries  Cognitive Features That Contribute To Risk:  None    Suicide Risk:  Mild:  There are no identifiable plans, no associated intent, mild dysphoria and related symptoms, good self-control (both objective and subjective assessment), few other risk factors, and identifiable protective factors, including available and accessible social support.  Plan Of Care/Follow-up recommendations:  Follow-up recommendations:  Activity:  Normal, as tolerated Diet:  Per PCP recommendation  Patient is instructed prior to discharge to: Take all medications as prescribed by his mental healthcare provider. Report any adverse effects and/or reactions from the medicines to his outpatient provider promptly. Patient has been instructed & cautioned: To not engage in alcohol and or illegal drug use while on prescription medicines.  In the event of worsening symptoms, patient is instructed to call the crisis hotline at 988, 911 and or go to the nearest ED for appropriate evaluation and treatment of symptoms. To follow-up with his primary care provider for your other medical issues, concerns and or health care needs.   Disposition: Home, AMA.  Rosezetta Schlatter, MD 01/12/2023, 9:28 AM

## 2023-01-12 NOTE — ED Notes (Signed)
Patient A&O x 4, ambulatory. Patient discharged in no acute distress. Patient denied SI/HI, A/VH upon discharge. Patient verbalized understanding of all discharge instructions explained by staff, to include follow up appointments and safety plan. Patient reported mood 10/10.  Pt belongings returned to patient from locker # 5 intact. Patient escorted to lobby via staff for transport to destination. Safety maintained.

## 2023-01-12 NOTE — ED Notes (Signed)
Patient resting quietly in bed with eyes closed, Respirations equal and unlabored, skin warm and dry, NAD. Routine safety checks conducted according to facility protocol. Will continue to monitor for safety 

## 2023-01-23 ENCOUNTER — Other Ambulatory Visit: Payer: Self-pay

## 2023-01-23 ENCOUNTER — Emergency Department (HOSPITAL_COMMUNITY)
Admission: EM | Admit: 2023-01-23 | Discharge: 2023-01-24 | Disposition: A | Discharge status: Home / Self Care | Payer: Medicare HMO | Attending: Emergency Medicine | Admitting: Emergency Medicine

## 2023-01-23 DIAGNOSIS — H5702 Anisocoria: Secondary | ICD-10-CM | POA: Diagnosis not present

## 2023-01-23 DIAGNOSIS — Z5900 Homelessness unspecified: Secondary | ICD-10-CM | POA: Insufficient documentation

## 2023-01-23 DIAGNOSIS — R55 Syncope and collapse: Secondary | ICD-10-CM | POA: Diagnosis not present

## 2023-01-23 DIAGNOSIS — Y908 Blood alcohol level of 240 mg/100 ml or more: Secondary | ICD-10-CM | POA: Insufficient documentation

## 2023-01-23 DIAGNOSIS — G8929 Other chronic pain: Secondary | ICD-10-CM | POA: Diagnosis not present

## 2023-01-23 DIAGNOSIS — R45851 Suicidal ideations: Secondary | ICD-10-CM | POA: Diagnosis not present

## 2023-01-23 DIAGNOSIS — F10929 Alcohol use, unspecified with intoxication, unspecified: Secondary | ICD-10-CM

## 2023-01-23 DIAGNOSIS — Z79899 Other long term (current) drug therapy: Secondary | ICD-10-CM | POA: Insufficient documentation

## 2023-01-23 DIAGNOSIS — I10 Essential (primary) hypertension: Secondary | ICD-10-CM | POA: Diagnosis not present

## 2023-01-23 DIAGNOSIS — K439 Ventral hernia without obstruction or gangrene: Secondary | ICD-10-CM | POA: Diagnosis not present

## 2023-01-23 DIAGNOSIS — R109 Unspecified abdominal pain: Secondary | ICD-10-CM | POA: Insufficient documentation

## 2023-01-23 DIAGNOSIS — F10121 Alcohol abuse with intoxication delirium: Secondary | ICD-10-CM | POA: Insufficient documentation

## 2023-01-23 MED ORDER — LORAZEPAM 2 MG/ML IJ SOLN
INTRAMUSCULAR | Status: AC
  Administered 2023-01-23: 2 mg via INTRAVENOUS
  Filled 2023-01-23: qty 1

## 2023-01-23 MED ORDER — LORAZEPAM 2 MG/ML IJ SOLN: 2.0000 mg | Freq: Once | INTRAMUSCULAR | Status: AC

## 2023-01-23 NOTE — ED Provider Notes (Signed)
[facility] EMERGENCY DEPARTMENT AT [facility] Provider Note   CSN: [id number] Arrival date & time: 01/23/23  2234     History  Chief Complaint  Patient presents with   Abdominal Pain   Alcohol Intoxication    [name] is a 67 y.o. male.  67 y/o male with history of thyroid disease, liver cirrhosis, alcohol abuse, hypertension, suicide attempt presents to the emergency department by EMS.  He initially complained of shortness of breath and abdominal pain with EMS.  Upon my questioning of the patient he states that he has had ongoing abdominal pain for the last 10 years.  It feels no different than his baseline.  He is eating a ham sandwich in the emergency department, is clinically intoxicated with slurred speech.  Was previously stumbling about the exam room with unsteady gait.  Insistent that he needs to leave the ED and is unwilling to stay.  Threatened to pull a knife on staff if we were to try and keep him in the department.  The history is provided by the patient. No language interpreter was used.  Abdominal Pain Alcohol Intoxication Associated symptoms include abdominal pain.       Home Medications Prior to Admission medications   Medication Sig Start Date End Date Taking? Authorizing Provider  amLODipine (NORVASC) 10 MG tablet Take 1 tablet (10 mg total) by mouth daily. 01/11/23 02/10/23  Lamar Sprinkles, MD  clotrimazole (LOTRIMIN) 1 % cream Apply topically 2 (two) times daily. 01/11/23   Lamar Sprinkles, MD  gabapentin (NEURONTIN) 300 MG capsule Take 1 capsule (300 mg total) by mouth 3 (three) times daily. 01/11/23 02/10/23  Lamar Sprinkles, MD  Multiple Vitamin (MULTIVITAMIN WITH MINERALS) TABS tablet Take 1 tablet by mouth daily. Patient not taking: Reported on 01/09/2023 07/09/22   Princess Bruins, DO  thiamine (VITAMIN B-1) 100 MG tablet Take 1 tablet (100 mg total) by mouth daily. Patient not taking: Reported on 01/09/2023 07/09/22   Princess Bruins, DO       Allergies    Carrot [daucus carota]    Review of Systems   Review of Systems  Gastrointestinal:  Positive for abdominal pain.  Ten systems reviewed and are negative for acute change, except as noted in the HPI.    Physical Exam Updated Vital Signs BP 112/60   Pulse 65   Temp 97.9 F (36.6 C) (Oral)   Resp 16   SpO2 96%   Physical Exam Vitals and nursing note reviewed.  Constitutional:      General: He is not in acute distress.    Appearance: He is well-developed. He is not diaphoretic.     Comments: Slurred speech. Disheveled. Stumbling in the room. Hiccups.   HENT:     Head: Normocephalic and atraumatic.  Eyes:     General: No scleral icterus.    Conjunctiva/sclera: Conjunctivae normal.     Comments: Anisocoria; L>R  Pulmonary:     Effort: Pulmonary effort is normal. No respiratory distress.     Comments: Respirations even and unlabored. Abdominal:     Comments: Abdomen obese, distended, soft. Large ventral hernia present. Prior ex-lap midline incision.  Musculoskeletal:        General: Normal range of motion.     Cervical back: Normal range of motion.  Skin:    General: Skin is warm and dry.     Coloration: Skin is not pale.     Findings: No erythema or rash.  Neurological:     Mental  Status: He is alert.     Comments: Alert, slurred speech. Unsteady gait. Moving all extremities spontaneously.  Psychiatric:        Behavior: Behavior normal.     ED Results / Procedures / Treatments   Labs (all labs ordered are listed, but only abnormal results are displayed) Labs Reviewed - No data to display  EKG None  Radiology No results found.  Procedures Procedures    Medications Ordered in ED Medications  LORazepam (ATIVAN) injection 2 mg (2 mg Intravenous Given 01/23/23 2307)  ibuprofen (ADVIL) tablet 600 mg (600 mg Oral Given 01/24/23 0422)    ED Course/ Medical Decision Making/ A&P Clinical Course as of 01/24/23 0520  Mon Jan 23, 2023  2326 Patient  intoxicated, stumbling, difficulty to redirect. Given 2mg  IV Ativan for agitation. Now presently calm and eating a sandwich. Plan for IVC if agitation recurs as patient is unsafe for discharge given his degree of intoxication. [KH]  Tue Jan 24, 2023  0102 Patient sleeping. [KH]  [phone] Patient awake and much more agreeable.  His speech is clearer and he appears less intoxicated.  Able to ambulate independently.  Inquiring as to how he got to the emergency department.  Explains that he called EMS for transport.  Does confirm lower abdominal pain, but notes this to be chronic and unchanged.  I have reviewed his CT scan from September 2023 which, at this time, showed findings of suspected colitis.  His abdominal exam is reassuring without any focal tenderness.  Abdomen is soft without palpable masses or peritoneal signs. [KH]  [phone] Patient desiring discharge. Hemodynamically stable. No present concern for acute withdrawal. Given bus pass for transport. [KH]    Clinical Course User Index [KH] Antony Madura, PA-C                             Medical Decision Making Risk Prescription drug management.   This patient presents to the ED for concern of chronic abdominal pain, this involves an extensive number of treatment options, and is a complaint that carries with it a high risk of complications and morbidity.  The differential diagnosis includes ruptured viscous vs strangulated/incarcerated hernia vs pSBO/SBO vs pain from adhesions.   Co morbidities that complicate the patient evaluation  Liver cirrhosis ETOH abuse   Additional history obtained:  Additional history obtained from EMS External records from outside source obtained and reviewed including prior CT from 06/2022   Cardiac Monitoring:  The patient was maintained on a cardiac monitor.  I personally viewed and interpreted the cardiac monitored which showed an underlying rhythm of: NSR   Medicines ordered and prescription drug  management:  I ordered medication including Ativan for agitation  Reevaluation of the patient after these medicines showed that the patient improved I have reviewed the patients home medicines and have made adjustments as needed   Test Considered:  Ethanol    Problem List / ED Course:  As above   Reevaluation:  After the interventions noted above, I reevaluated the patient and found that they have :improved   Social Determinants of Health:  Homelessness    Dispostion:  After consideration of the diagnostic results and the patients response to treatment, I feel that the patent would benefit from outpatient PCP follow up. Return precautions discussed and provided. Patient discharged in stable condition with no unaddressed concerns.          Final Clinical Impression(s) / ED Diagnoses  Final diagnoses:  Alcoholic intoxication with complication  Chronic abdominal pain    Rx / DC Orders ED Discharge Orders     None         Antony Madura, PA-C 01/24/23 0540    Zadie Rhine, MD 01/24/23 [phone]

## 2023-01-23 NOTE — ED Triage Notes (Signed)
Pt arrives EMS with complaints of SHOB and abdominal pain. Pt intoxicated and not answering questions appropriately.  Pt repeating "I gotta go I need to go." Pt attempting to get up and leave. PA Humes made aware and at bedside.

## 2023-01-24 ENCOUNTER — Emergency Department (HOSPITAL_COMMUNITY): Payer: Medicare HMO

## 2023-01-24 ENCOUNTER — Emergency Department (HOSPITAL_COMMUNITY)
Admission: EM | Admit: 2023-01-24 | Discharge: 2023-01-24 | Discharge status: Against Medical Advice | Payer: Medicare HMO | Source: Home / Self Care | Attending: Emergency Medicine | Admitting: Emergency Medicine

## 2023-01-24 ENCOUNTER — Other Ambulatory Visit: Payer: Self-pay

## 2023-01-24 DIAGNOSIS — R45851 Suicidal ideations: Secondary | ICD-10-CM | POA: Insufficient documentation

## 2023-01-24 DIAGNOSIS — I10 Essential (primary) hypertension: Secondary | ICD-10-CM | POA: Insufficient documentation

## 2023-01-24 DIAGNOSIS — F1010 Alcohol abuse, uncomplicated: Secondary | ICD-10-CM | POA: Insufficient documentation

## 2023-01-24 DIAGNOSIS — R109 Unspecified abdominal pain: Secondary | ICD-10-CM | POA: Diagnosis not present

## 2023-01-24 DIAGNOSIS — R55 Syncope and collapse: Secondary | ICD-10-CM | POA: Insufficient documentation

## 2023-01-24 DIAGNOSIS — Y908 Blood alcohol level of 240 mg/100 ml or more: Secondary | ICD-10-CM | POA: Insufficient documentation

## 2023-01-24 LAB — CBC WITH DIFFERENTIAL/PLATELET
Abs Immature Granulocytes: 0 10*3/uL (ref 0.00–0.07)
Basophils Absolute: 0.1 10*3/uL (ref 0.0–0.1)
Basophils Relative: 1 %
Eosinophils Absolute: 0.1 10*3/uL (ref 0.0–0.5)
Eosinophils Relative: 3 %
HCT: 40.4 % (ref 39.0–52.0)
Hemoglobin: 12.2 g/dL — ABNORMAL LOW (ref 13.0–17.0)
Immature Granulocytes: 0 %
Lymphocytes Relative: 39 %
Lymphs Abs: 1.8 10*3/uL (ref 0.7–4.0)
MCH: 23.5 pg — ABNORMAL LOW (ref 26.0–34.0)
MCHC: 30.2 g/dL (ref 30.0–36.0)
MCV: 77.7 fL — ABNORMAL LOW (ref 80.0–100.0)
Monocytes Absolute: 0.4 10*3/uL (ref 0.1–1.0)
Monocytes Relative: 9 %
Neutro Abs: 2.2 10*3/uL (ref 1.7–7.7)
Neutrophils Relative %: 48 %
Platelets: 133 10*3/uL — ABNORMAL LOW (ref 150–400)
RBC: 5.2 MIL/uL (ref 4.22–5.81)
RDW: 19.4 % — ABNORMAL HIGH (ref 11.5–15.5)
WBC: 4.5 10*3/uL (ref 4.0–10.5)
nRBC: 0 % (ref 0.0–0.2)

## 2023-01-24 LAB — ETHANOL: Alcohol, Ethyl (B): 249 mg/dL — ABNORMAL HIGH (ref <10)

## 2023-01-24 LAB — COMPREHENSIVE METABOLIC PANEL
ALT: 30 U/L (ref 0–44)
AST: 59 U/L — ABNORMAL HIGH (ref 15–41)
Albumin: 3.4 g/dL — ABNORMAL LOW (ref 3.5–5.0)
Alkaline Phosphatase: 64 U/L (ref 38–126)
Anion gap: 8 (ref 5–15)
BUN: 12 mg/dL (ref 8–23)
CO2: 24 mmol/L (ref 22–32)
Calcium: 8.4 mg/dL — ABNORMAL LOW (ref 8.9–10.3)
Chloride: 107 mmol/L (ref 98–111)
Creatinine, Ser: 0.85 mg/dL (ref 0.61–1.24)
GFR, Estimated: >60 mL/min (ref >60)
Glucose, Bld: 86 mg/dL (ref 70–99)
Potassium: 3.9 mmol/L (ref 3.5–5.1)
Sodium: 139 mmol/L (ref 135–145)
Total Bilirubin: 1.3 mg/dL — ABNORMAL HIGH (ref 0.3–1.2)
Total Protein: 7.6 g/dL (ref 6.5–8.1)

## 2023-01-24 LAB — RAPID URINE DRUG SCREEN, HOSP PERFORMED
Amphetamines: NOT DETECTED
Barbiturates: NOT DETECTED
Benzodiazepines: NOT DETECTED
Cocaine: NOT DETECTED
Opiates: NOT DETECTED
Tetrahydrocannabinol: NOT DETECTED

## 2023-01-24 LAB — TYPE AND SCREEN
ABO/RH(D): O POS
Antibody Screen: NEGATIVE

## 2023-01-24 LAB — PROTIME-INR
INR: 1.1 (ref 0.8–1.2)
Prothrombin Time: 13.8 seconds (ref 11.4–15.2)

## 2023-01-24 LAB — TROPONIN I (HIGH SENSITIVITY): Troponin I (High Sensitivity): 5 ng/L (ref <18)

## 2023-01-24 MED ORDER — LORAZEPAM 1 MG PO TABS: 0.0000 mg | ORAL_TABLET | Freq: Two times a day (BID) | ORAL | Status: DC

## 2023-01-24 MED ORDER — IBUPROFEN 400 MG PO TABS
600.0000 mg | ORAL_TABLET | Freq: Once | ORAL | Status: AC
  Administered 2023-01-24: 600 mg via ORAL
  Filled 2023-01-24: qty 1

## 2023-01-24 MED ORDER — LORAZEPAM 1 MG PO TABS: 0.0000 mg | ORAL_TABLET | Freq: Four times a day (QID) | ORAL | Status: DC

## 2023-01-24 MED ORDER — NICOTINE 21 MG/24HR TD PT24: 21.0000 mg | MEDICATED_PATCH | Freq: Every day | TRANSDERMAL | Status: DC

## 2023-01-24 MED ORDER — SODIUM CHLORIDE 0.9 % IV BOLUS
500.0000 mL | Freq: Once | INTRAVENOUS | Status: AC
  Administered 2023-01-24: 500 mL via INTRAVENOUS

## 2023-01-24 MED ORDER — THIAMINE HCL 100 MG/ML IJ SOLN
100.0000 mg | Freq: Once | INTRAMUSCULAR | Status: AC
  Administered 2023-01-24: 100 mg via INTRAVENOUS
  Filled 2023-01-24: qty 2

## 2023-01-24 MED ORDER — LORAZEPAM 2 MG/ML IJ SOLN: 0.0000 mg | Freq: Two times a day (BID) | INTRAMUSCULAR | Status: DC

## 2023-01-24 MED ORDER — ONDANSETRON HCL 4 MG PO TABS: 4.0000 mg | ORAL_TABLET | Freq: Three times a day (TID) | ORAL | Status: DC | PRN

## 2023-01-24 MED ORDER — ALUM & MAG HYDROXIDE-SIMETH 200-200-20 MG/5ML PO SUSP: 30.0000 mL | Freq: Four times a day (QID) | ORAL | Status: DC | PRN

## 2023-01-24 MED ORDER — LORAZEPAM 2 MG/ML IJ SOLN: 0.0000 mg | Freq: Four times a day (QID) | INTRAMUSCULAR | Status: DC

## 2023-01-24 MED ORDER — PANTOPRAZOLE 80MG IVPB - SIMPLE MED
80.0000 mg | Freq: Once | INTRAVENOUS | Status: AC
  Administered 2023-01-24: 80 mg via INTRAVENOUS
  Filled 2023-01-24: qty 80

## 2023-01-24 NOTE — ED Provider Notes (Signed)
Emergency Department Provider Note   I have reviewed the triage vital signs and the nursing notes.   HISTORY  Chief Complaint Altered Mental Status   HPI [name] is a 67 y.o. male with past history of cirrhosis, alcohol abuse, hepatitis C, hypertension presents to the emergency department by EMS.  He is currently homeless. A friend called EMS when he was having trouble breathing during sleeping.  Apparently was stopping breathing occasionally but then would restart.  He tells me that he frequently does this and knows about it.  He also apparently had 2 episodes of syncope but also notes that this is common for him.  No prodrome.  No chest pain or palpitations.  He has constant abdominal pain and this is unchanged today.  Nothing new or different.  No fevers or chills.  He vomits most days because he drinks basically whenever he is awake.  He does have clots with vomiting but this is been present for at least the last 4 weeks, daily, if not longer.  No new or larger volume hematemesis.  Patient tells me that he is become increasingly hopeless and "I need some help." He rarely wants help to stop drinking.  He has apparently been to [facility] before with no success. Endorses some suicidal thoughts stating "I just can't do this anymore" and reports a prior history of cutting himself but no specific plan to harm himself today.    Past Medical History:  Diagnosis Date   Alcohol abuse    Anxiety    Cirrhosis (HCC)    Depression    Hep C w/o coma, chronic (HCC)    Hepatitis C    Hypertension    Liver cirrhosis (HCC)    Pancreatitis    Suicide attempt (HCC)    Thyroid disease     Review of Systems  Constitutional: No fever/chills Cardiovascular: Denies chest pain. Positive syncope (chronic) Respiratory: Denies shortness of breath. Gastrointestinal: Positive abdominal pain. Positive nausea and vomiting daily.  No diarrhea.  No constipation. Genitourinary: Negative for  dysuria. Musculoskeletal: Negative for back pain. Skin: Negative for rash. Neurological: Negative for headaches, focal weakness or numbness. Psychiatric: positive passive SI.    ____________________________________________   PHYSICAL EXAM:  VITAL SIGNS: ED Triage Vitals [01/24/23 1213]  Enc Vitals Group     BP (!) 186/84     Pulse Rate 70     Resp (!) 24     Temp 97.8 F (36.6 C)     Temp src      SpO2 97 %   Constitutional: Alert and oriented. Well appearing and in no acute distress. Appears mildly intoxicated but able to provide a full history.  Eyes: Conjunctivae are normal.  Head: Atraumatic. Nose: No congestion/rhinnorhea. Neck: No stridor.   Cardiovascular: Normal rate, regular rhythm. Good peripheral circulation. Grossly normal heart sounds.   Respiratory: Normal respiratory effort.  No retractions. Lungs CTAB. Gastrointestinal: Soft and nontender. Mild distention.  Musculoskeletal: No lower extremity tenderness nor edema. No gross deformities of extremities. Neurologic:  Normal speech and language. No gross focal neurologic deficits are appreciated.  Skin:  Skin is warm, dry and intact. No rash noted.   ____________________________________________   LABS (all labs ordered are listed, but only abnormal results are displayed)  Labs Reviewed  COMPREHENSIVE METABOLIC PANEL - Abnormal; Notable for the following components:      Result Value   Calcium 8.4 (*)    Albumin 3.4 (*)    AST 59 (*)  Total Bilirubin 1.3 (*)    All other components within normal limits  ETHANOL - Abnormal; Notable for the following components:   Alcohol, Ethyl (B) 249 (*)    All other components within normal limits  CBC WITH DIFFERENTIAL/PLATELET - Abnormal; Notable for the following components:   Hemoglobin 12.2 (*)    MCV 77.7 (*)    MCH 23.5 (*)    RDW 19.4 (*)    Platelets 133 (*)    All other components within normal limits  PROTIME-INR  RAPID URINE DRUG SCREEN, HOSP  PERFORMED  TYPE AND SCREEN  TROPONIN I (HIGH SENSITIVITY)  TROPONIN I (HIGH SENSITIVITY)   ____________________________________________  EKG   EKG Interpretation  Date/Time:  Tuesday January 24 2023 12:12:13 EDT Ventricular Rate:  69 PR Interval:  158 QRS Duration: 97 QT Interval:  418 QTC Calculation: 448 R Axis:   57 Text Interpretation: Sinus rhythm Confirmed by Alona Bene [phone]) on 01/24/2023 12:23:08 PM        ____________________________________________  RADIOLOGY  DG Chest Portable 1 View  Result Date: 01/24/2023 CLINICAL DATA:  Shortness of breath. EXAM: PORTABLE CHEST 1 VIEW COMPARISON:  CXR 05/05/22 FINDINGS: No pleural effusion. No pneumothorax. Prominent cardiac contours. There are prominent interstitial opacities, which can be seen in the setting of atypical infection or pulmonary venous congestion. Previously seen nodular opacities in the right upper lung are no longer visualized. No radiographically apparent displaced rib fractures. The upper abdomen is poorly imaged, but the visualized portions are unremarkable. The left costophrenic angle is out of the field of view. IMPRESSION: Limitations: The left costophrenic angle on the left hemidiaphragm are not imaged. Prominent interstitial opacities, which can be seen in the setting of atypical infection or pulmonary venous congestion. Electronically Signed   By: Lorenza Cambridge M.D.   On: 01/24/2023 14:38   CT Head Wo Contrast  Result Date: 01/24/2023 CLINICAL DATA:  Neck trauma (Age >= 65y); Mental status change, unknown cause. EXAM: CT HEAD WITHOUT CONTRAST CT CERVICAL SPINE WITHOUT CONTRAST TECHNIQUE: Multidetector CT imaging of the head and cervical spine was performed following the standard protocol without intravenous contrast. Multiplanar CT image reconstructions of the cervical spine were also generated. RADIATION DOSE REDUCTION: This exam was performed according to the departmental dose-optimization program which  includes automated exposure control, adjustment of the mA and/or kV according to patient size and/or use of iterative reconstruction technique. COMPARISON:  Head CT 02/07/2021 and MRI 02/08/2021. Cervical spine CT 11/09/2011. FINDINGS: CT HEAD FINDINGS Brain: There is no evidence of an acute infarct, intracranial hemorrhage, mass, midline shift, or extra-axial fluid collection. Hypodensities in the cerebral white matter are similar to the prior CT and are nonspecific but compatible with mild chronic small vessel ischemic disease. Encephalomalacia is again noted laterally in the left temporal lobe. There is mild cerebral atrophy. Vascular: Calcified atherosclerosis at the skull base. No hyperdense vessel. Skull: No fracture or suspicious osseous lesion. Sinuses/Orbits: Visualized paranasal sinuses and mastoid air cells are clear. Left cataract extraction. Other: None. CT CERVICAL SPINE FINDINGS Alignment: Straightening of the normal cervical lordosis. Trace anterolisthesis of C2 on C3, C5 on C6, and C7 on T1. Skull base and vertebrae: No acute fracture or suspicious osseous lesion. Soft tissues and spinal canal: No prevertebral fluid or swelling. No visible canal hematoma. Disc levels: Progressive multilevel disc degeneration, greatest at C3-4 where uncovertebral spurring and facet arthrosis result in severe right neural foraminal stenosis. Moderate right neural foraminal stenosis at C4-5 and C6-7. Upper chest: Clear  lung apices. Other: Mild calcified atherosclerosis at the carotid bifurcations. IMPRESSION: 1. No evidence of acute intracranial abnormality. 2. Mild chronic small vessel ischemic disease and left temporal lobe encephalomalacia. 3. No acute cervical spine fracture. Electronically Signed   By: Sebastian Ache M.D.   On: 01/24/2023 13:55   CT Cervical Spine Wo Contrast  Result Date: 01/24/2023 CLINICAL DATA:  Neck trauma (Age >= 65y); Mental status change, unknown cause. EXAM: CT HEAD WITHOUT CONTRAST CT  CERVICAL SPINE WITHOUT CONTRAST TECHNIQUE: Multidetector CT imaging of the head and cervical spine was performed following the standard protocol without intravenous contrast. Multiplanar CT image reconstructions of the cervical spine were also generated. RADIATION DOSE REDUCTION: This exam was performed according to the departmental dose-optimization program which includes automated exposure control, adjustment of the mA and/or kV according to patient size and/or use of iterative reconstruction technique. COMPARISON:  Head CT 02/07/2021 and MRI 02/08/2021. Cervical spine CT 11/09/2011. FINDINGS: CT HEAD FINDINGS Brain: There is no evidence of an acute infarct, intracranial hemorrhage, mass, midline shift, or extra-axial fluid collection. Hypodensities in the cerebral white matter are similar to the prior CT and are nonspecific but compatible with mild chronic small vessel ischemic disease. Encephalomalacia is again noted laterally in the left temporal lobe. There is mild cerebral atrophy. Vascular: Calcified atherosclerosis at the skull base. No hyperdense vessel. Skull: No fracture or suspicious osseous lesion. Sinuses/Orbits: Visualized paranasal sinuses and mastoid air cells are clear. Left cataract extraction. Other: None. CT CERVICAL SPINE FINDINGS Alignment: Straightening of the normal cervical lordosis. Trace anterolisthesis of C2 on C3, C5 on C6, and C7 on T1. Skull base and vertebrae: No acute fracture or suspicious osseous lesion. Soft tissues and spinal canal: No prevertebral fluid or swelling. No visible canal hematoma. Disc levels: Progressive multilevel disc degeneration, greatest at C3-4 where uncovertebral spurring and facet arthrosis result in severe right neural foraminal stenosis. Moderate right neural foraminal stenosis at C4-5 and C6-7. Upper chest: Clear lung apices. Other: Mild calcified atherosclerosis at the carotid bifurcations. IMPRESSION: 1. No evidence of acute intracranial abnormality.  2. Mild chronic small vessel ischemic disease and left temporal lobe encephalomalacia. 3. No acute cervical spine fracture. Electronically Signed   By: Sebastian Ache M.D.   On: 01/24/2023 13:55    ____________________________________________   PROCEDURES  Procedure(s) performed:   Procedures  None  ____________________________________________   INITIAL IMPRESSION / ASSESSMENT AND PLAN / ED COURSE  Pertinent labs & imaging results that were available during my care of the patient were reviewed by me and considered in my medical decision making (see chart for details).   This patient is Presenting for Evaluation of abdominal pain, which does require a range of treatment options, and is a complaint that involves a high risk of morbidity and mortality.  The Differential Diagnoses includes but is not exclusive to acute cholecystitis, intrathoracic causes for epigastric abdominal pain, gastritis, duodenitis, pancreatitis, small bowel or large bowel obstruction, abdominal aortic aneurysm, hernia, gastritis, etc.   Critical Interventions-    Medications  sodium chloride 0.9 % bolus 500 mL (0 mLs Intravenous Stopped 01/24/23 1526)  pantoprazole (PROTONIX) 80 mg /NS 100 mL IVPB (0 mg Intravenous Stopped 01/24/23 1526)  thiamine (VITAMIN B1) injection 100 mg (100 mg Intravenous Given 01/24/23 1404)    Reassessment after intervention: symptoms improved. Patient up and walking around. Eating without difficulty.   I decided to review pertinent External Data, and in summary patient with multiple ED evaluations related to EtOH abuse/intoxication.  Clinical Laboratory Tests Ordered, included UDS negative.  Troponin negative.  EtOH elevated at 249 similar to prior values.  No leukocytosis or severe anemia.  No acute kidney injury.  LFTs and bilirubin normal.  Lipase normal.  Radiologic Tests Ordered, included CXR, CT head, and CT c spine. I independently interpreted the images and agree with radiology  interpretation.   Cardiac Monitor Tracing which shows NSR.    Social Determinants of Health Risk patient is homeless and drinks EtOH daily.   Consult complete with TTS to evaluate for SI and substance abuse.   Medical Decision Making: Summary:  Presents to the emergency department for evaluation of multiple medical complaints, many of which appear chronic and been worked up extensively in the ED in the past.  Abdomen diffusely soft.  Lab work appears to be at patient's baseline.  He is intoxicated which is not particularly new either.  CT imaging of the head and C-spine showed no traumatic findings.  Reevaluation with update and discussion with patient feeling well from a medical standpoint. No plans for CT abdomen/pelvis. Needs to stop drinking and he continues to describe passive SI. Will not place patient under IVC. Will ask TTS to evaluate from an SI standpoint. Patient is medically clear at this point.   Made aware by nursing that patient does not want to stay in the ED any longer and wishes to leave. Discussed with him that I would prefer for him to stay and speak with psych but will not keep him under IVC. Clinically, patient is sober and has capacity to make decision to leave. He is ambulatory without assistance. Passive SI is chronic and present mainly when drinking. Provided resource list at discharge. Patient will be leaving AMA.   Considered admission but symptoms largely chronic and labs/vitals are stable.   Patient's presentation is most consistent with acute presentation with potential threat to life or bodily function.   Disposition: AMA  ____________________________________________  FINAL CLINICAL IMPRESSION(S) / ED DIAGNOSES  Final diagnoses:  Alcohol abuse  Syncope, unspecified syncope type  Suicidal ideation    Note:  This document was prepared using Dragon voice recognition software and may include unintentional dictation errors.  Alona BeneJoshua Bracken Moffa, MD,  [facility] Emergency [facility]    Charlene Detter, Arlyss RepressJoshua G, MD 01/25/23 [id number]

## 2023-01-24 NOTE — ED Notes (Signed)
Pt more awake and alert asking for an update. PA made aware and at bedside speaking with pt. Pt alert and oriented x4 at this time.

## 2023-01-24 NOTE — ED Notes (Signed)
Pt states he wants to leave, requesting IV be taken out. MD aware.

## 2023-01-24 NOTE — ED Triage Notes (Signed)
Pt bib ems after being called out for pt being unresponsive. First medics on scene noted 2 syncopal episodes. Pt +ETOH. Endorses nausea vomiting and headache. Endorses blood in his vomit with a hx of esophageal varices. Pt has not been taking his medications at home. Abdomen distended. Pt awake and alert on arrival.  180/90 HR 70 RR 20 98% RA CBG 93

## 2023-01-24 NOTE — Discharge Instructions (Signed)
Substance Abuse Treatment Programs ° °Intensive Outpatient Programs °[facility]     °[address]      °[place], [place]                   °[phone]      ° °[facility] °[address] °[place], [place] °[phone] ° °[facility]     °(Inpatient and outpatient)     °[address]           °[phone]   ° °[facility] °[phone] (Suboxone and Methadone) ° °[address]      °[place], [place] [address]      °[phone]      ° °[address] °[place], [place] °[phone] ° °[facility] (Outpatient/Inpatient, Chemical)    °(insurance only) [phone]      °       °[facility] (Groups & Residential) °[place], [place] °[phone] ° °   °[facility]     °[address]     °[place], [place]      °[phone]      ° °[facility] (for caregivers and family) °[address] °[place], [place] °336-299-4655 ° ° ° ° ° °Residential Treatment Programs °[facility]      °[address], [place], [place] [address]  °[phone]      ° °[facility] °[address] [place], [place] [address] °[phone] ° °[facility]        °[phone]      ° °[facility] °[phone] ° °[facility]             °[address]                                         °[place], [place]                                                °[phone] or [phone]                              ° °[facility] [place] °[address] °[place], [address] °[phone] ° °[facility]    °[address]      °[place], [place]     °[phone]      ° °[facility] °[address] °, [place] °[phone] ° °[facility]   °[address]     °[place], [place] [address]     °[phone]      °Admissions: 8am-3pm M-F ° °[facility] °[address] °[place],  [place] °[phone] ° °[facility]: Residential Program (90 Days)   °[place], [place]      °336-725-8389 or [phone]    ° °[facility] °[place], [place] °(Walk in Hours over the weekend or by referral) ° °[facility] °[address], [place],  [address] °[phone] ° °Crisis Mobile: [facility]:  1-877-626-1772 (for crisis response 24 hours a day) °[facility] Hotline:      1-800-256-2452 °Outpatient Psychiatry and Counseling ° °[facility]: Mobile Crisis   Management 24 hours:  1-877-626-1772 ° °[facility] sliding scale fee and walk in schedule: M-F 8am-12pm/1pm-3pm °[address]  °[place], [place] [address] °[phone] ° °[facility] °[address] °[place], [place] [address] °[phone] ° °[facility] ([facility] known [facility])- new patient walk-in appointments available Monday - Friday 8am -3pm.          °[address] °[place], [place] [address] °336-676-6840 or crisis line- 336-676-6905 ° °[facility] Intensive Outpatient Therapy Program °[address] °[place], [place] [address] °336-832-9804 ° °[facility]                  °Crisis Services      °[phone]      °[address]     °[place], [place] [address]                ° °[facility]   °[facility] °[phone] °[address] °[place], [place] [address] ° ° °[facility]          °[address],  °[place], [place] [address]       °[phone] ° °[facility] °[address] °[place], [place]  [address] °336-292-1510 ° °[facility]    °[address]    °[place], [place] [address]     °[phone]      ° °Parish McKinney, MD     °[address]     °[place] [address]     °[phone]     °  °[facility] °[address] °[place] [address] ° °[facility]     °[address]     °[place], [place]      °336-542-2076      ° °[facility] °Shamsher Ahluwalia, MD °[address] °[place], [place] [address] °[phone] ° °[facility]     °[address]     °[place], [place] [address]     °336-274-1237      ° °[facility] °[address] °[place], [place] [address] °336-854-4450 °Resources for Temporary Residential Assistance/Crisis Centers ° °DAY CENTERS °[facility] °M-F 8am-3pm   °[address] [place], [place] [address]   336-332-0824 °Services include: laundry, barbering, support groups, case management, phone  & computer access, showers, AA/NA mtgs, mental health/substance abuse nurse, job skills class, disability information, VA assistance, spiritual classes, etc.  ° °HOMELESS SHELTERS ° °[facility]     °[facility]   °[address], [place]     °[phone]       °       °[facility] (women and children)       °[address] °[place], [place] [address] °336-275-0820 °[email or web address] for application and process °Application Required ° °[facility]   °[address]    °[place] [address]     °[phone]       °             °[facility] °[address] °[place], [place] [address] °[phone] °336-235-0363(schedule application appt.) °Application Required ° °[facility] (women only)    °[address]     °[place],  [address]     °336-884-1039      °  Intake starts 6pm daily °Need valid ID, SSC, & Police report °[facility] [place] °[address] °[place], [place] °336-881-5420 °Application Required ° °[facility] (men only)     °[address]      °[place], [place]     °[phone]      ° °[facility] °(Pregnant women only) °[address] °[place], [place] °336-275-0206 ° °[facility]      °[address]      °[place], [place] [address]     °[phone]      °       °[facility] [place] [facility] °[address] °[place], [place] °336-723-1848 °90 day commitment/SA/Application process ° °[facility](men only)     °[address]     °[place], [place]     °[phone]       °Check-in at 7pm     °       °[facility] °[address] °[place], [place] [address] °336-248-6684 °Men/Women/Women and Children must be there by 7 pm ° °[facility] °[place],  °336-722-8721                ° °

## 2023-02-10 ENCOUNTER — Emergency Department (HOSPITAL_COMMUNITY)
Admission: EM | Admit: 2023-02-10 | Discharge: 2023-02-11 | Disposition: A | Discharge status: Home / Self Care | Payer: Medicare HMO | Attending: Emergency Medicine | Admitting: Emergency Medicine

## 2023-02-10 ENCOUNTER — Encounter (HOSPITAL_COMMUNITY): Payer: Self-pay

## 2023-02-10 DIAGNOSIS — R109 Unspecified abdominal pain: Secondary | ICD-10-CM | POA: Insufficient documentation

## 2023-02-10 DIAGNOSIS — K7460 Unspecified cirrhosis of liver: Secondary | ICD-10-CM | POA: Insufficient documentation

## 2023-02-10 DIAGNOSIS — R7989 Other specified abnormal findings of blood chemistry: Secondary | ICD-10-CM | POA: Insufficient documentation

## 2023-02-10 DIAGNOSIS — R45851 Suicidal ideations: Secondary | ICD-10-CM | POA: Diagnosis not present

## 2023-02-10 DIAGNOSIS — Y907 Blood alcohol level of 200-239 mg/100 ml: Secondary | ICD-10-CM | POA: Diagnosis not present

## 2023-02-10 DIAGNOSIS — B1920 Unspecified viral hepatitis C without hepatic coma: Secondary | ICD-10-CM | POA: Diagnosis not present

## 2023-02-10 DIAGNOSIS — R45850 Homicidal ideations: Secondary | ICD-10-CM

## 2023-02-10 DIAGNOSIS — F10920 Alcohol use, unspecified with intoxication, uncomplicated: Secondary | ICD-10-CM | POA: Diagnosis not present

## 2023-02-10 LAB — CBC WITH DIFFERENTIAL/PLATELET
Abs Immature Granulocytes: 0.01 10*3/uL (ref 0.00–0.07)
Basophils Absolute: 0.1 10*3/uL (ref 0.0–0.1)
Basophils Relative: 2 %
Eosinophils Absolute: 0.1 10*3/uL (ref 0.0–0.5)
Eosinophils Relative: 2 %
HCT: 40.9 % (ref 39.0–52.0)
Hemoglobin: 12.9 g/dL — ABNORMAL LOW (ref 13.0–17.0)
Immature Granulocytes: 0 %
Lymphocytes Relative: 49 %
Lymphs Abs: 2.9 10*3/uL (ref 0.7–4.0)
MCH: 24.2 pg — ABNORMAL LOW (ref 26.0–34.0)
MCHC: 31.5 g/dL (ref 30.0–36.0)
MCV: 76.9 fL — ABNORMAL LOW (ref 80.0–100.0)
Monocytes Absolute: 0.8 10*3/uL (ref 0.1–1.0)
Monocytes Relative: 13 %
Neutro Abs: 1.9 10*3/uL (ref 1.7–7.7)
Neutrophils Relative %: 34 %
Platelets: 117 10*3/uL — ABNORMAL LOW (ref 150–400)
RBC: 5.32 MIL/uL (ref 4.22–5.81)
RDW: 19.7 % — ABNORMAL HIGH (ref 11.5–15.5)
WBC: 5.8 10*3/uL (ref 4.0–10.5)
nRBC: 0 % (ref 0.0–0.2)

## 2023-02-10 LAB — ETHANOL: Alcohol, Ethyl (B): 205 mg/dL — ABNORMAL HIGH (ref <10)

## 2023-02-10 LAB — COMPREHENSIVE METABOLIC PANEL
ALT: 37 U/L (ref 0–44)
AST: 51 U/L — ABNORMAL HIGH (ref 15–41)
Albumin: 3.9 g/dL (ref 3.5–5.0)
Alkaline Phosphatase: 78 U/L (ref 38–126)
Anion gap: 15 (ref 5–15)
BUN: 8 mg/dL (ref 8–23)
CO2: 20 mmol/L — ABNORMAL LOW (ref 22–32)
Calcium: 8.6 mg/dL — ABNORMAL LOW (ref 8.9–10.3)
Chloride: 103 mmol/L (ref 98–111)
Creatinine, Ser: 0.74 mg/dL (ref 0.61–1.24)
GFR, Estimated: >60 mL/min (ref >60)
Glucose, Bld: 96 mg/dL (ref 70–99)
Potassium: 3.7 mmol/L (ref 3.5–5.1)
Sodium: 138 mmol/L (ref 135–145)
Total Bilirubin: 2 mg/dL — ABNORMAL HIGH (ref 0.3–1.2)
Total Protein: 8.4 g/dL — ABNORMAL HIGH (ref 6.5–8.1)

## 2023-02-10 LAB — LIPASE, BLOOD: Lipase: 77 U/L — ABNORMAL HIGH (ref 11–51)

## 2023-02-10 MED ORDER — ZIPRASIDONE MESYLATE 20 MG IM SOLR
INTRAMUSCULAR | Status: AC
  Administered 2023-02-10: 10 mg via INTRAMUSCULAR
  Filled 2023-02-10: qty 20

## 2023-02-10 MED ORDER — ZIPRASIDONE MESYLATE 20 MG IM SOLR: 10.0000 mg | Freq: Once | INTRAMUSCULAR | Status: AC

## 2023-02-10 MED ORDER — STERILE WATER FOR INJECTION IJ SOLN
INTRAMUSCULAR | Status: AC
  Filled 2023-02-10: qty 10

## 2023-02-10 NOTE — ED Notes (Addendum)
Pt increasingly agitated and verbal aggressive towards staff. Pt asked repeatedly to calm down. Pt stated "I will cut all of y'all." Pt became psychically aggressive with staff and security. Pt medicated at this time. Security and [employer] at bedside.

## 2023-02-10 NOTE — ED Notes (Signed)
Patient was changed into purple scrubs and belongings were placed in the nurses station cabinet on the 16-18 side.

## 2023-02-10 NOTE — ED Provider Notes (Signed)
[place] EMERGENCY DEPARTMENT AT [facility] Provider Note   CSN: [id number] Arrival date & time: 02/10/23  1953     History  Chief Complaint  Patient presents with   Psychiatric Evaluation   Abdominal Pain    [name] is a 67 y.o. male.  Patient is a 67 year old male with a history of alcohol abuse, cirrhosis, hepatitis C who presents with abdominal pain and suicidal ideations.  He says that he has been drinking alcohol, the last time being about an hour ago.  He is having thoughts of wanting to kill himself.  He thinks that he will either stab himself or overdose.  He also says he is having thoughts of killing his girlfriend because he does not like her anymore.  He complains of some nausea and vomiting.  No fevers.  He states he was recently admitted to [facility] for psychiatric care and he request to be transferred there.       Home Medications Prior to Admission medications   Medication Sig Start Date End Date Taking? Authorizing Provider  amLODipine (NORVASC) 10 MG tablet Take 1 tablet (10 mg total) by mouth daily. 01/11/23 02/10/23  Lamar Sprinkles, MD  clotrimazole (LOTRIMIN) 1 % cream Apply topically 2 (two) times daily. 01/11/23   Lamar Sprinkles, MD  gabapentin (NEURONTIN) 300 MG capsule Take 1 capsule (300 mg total) by mouth 3 (three) times daily. 01/11/23 02/10/23  Lamar Sprinkles, MD  Multiple Vitamin (MULTIVITAMIN WITH MINERALS) TABS tablet Take 1 tablet by mouth daily. 07/09/22   Princess Bruins, DO  thiamine (VITAMIN B-1) 100 MG tablet Take 1 tablet (100 mg total) by mouth daily. 07/09/22   Princess Bruins, DO      Allergies    Carrot [daucus carota]    Review of Systems   Review of Systems  Constitutional:  Negative for chills, diaphoresis, fatigue and fever.  HENT:  Negative for congestion, rhinorrhea and sneezing.   Eyes: Negative.   Respiratory:  Negative for cough, chest tightness and shortness of breath.   Cardiovascular:  Negative  for chest pain and leg swelling.  Gastrointestinal:  Positive for abdominal pain, nausea and vomiting. Negative for blood in stool and diarrhea.  Genitourinary:  Negative for difficulty urinating, flank pain, frequency and hematuria.  Musculoskeletal:  Negative for arthralgias and back pain.  Skin:  Negative for rash.  Neurological:  Negative for dizziness, speech difficulty, weakness, numbness and headaches.  Psychiatric/Behavioral:  Positive for suicidal ideas.     Physical Exam Updated Vital Signs BP (!) 177/99   Pulse 83   Temp 97.8 F (36.6 C) (Oral)   Resp 17   Ht 5\' 8"  (1.727 m)   Wt 93 kg   SpO2 92%   BMI 31.17 kg/m  Physical Exam Constitutional:      Appearance: He is well-developed.     Comments: Sitting on the side of the bed, calm but easily agitated  HENT:     Head: Normocephalic and atraumatic.  Eyes:     Pupils: Pupils are equal, round, and reactive to light.  Cardiovascular:     Rate and Rhythm: Normal rate and regular rhythm.     Heart sounds: Normal heart sounds.  Pulmonary:     Effort: Pulmonary effort is normal. No respiratory distress.     Breath sounds: Normal breath sounds. No wheezing or rales.  Chest:     Chest wall: No tenderness.  Abdominal:     General: Bowel sounds are normal.  Palpations: Abdomen is soft.     Tenderness: There is no abdominal tenderness. There is no guarding or rebound.  Musculoskeletal:        General: Normal range of motion.     Cervical back: Normal range of motion and neck supple.  Lymphadenopathy:     Cervical: No cervical adenopathy.  Skin:    General: Skin is warm and dry.     Findings: No rash.  Neurological:     Mental Status: He is alert.     Comments: Awake and alert, no focal deficits     ED Results / Procedures / Treatments   Labs (all labs ordered are listed, but only abnormal results are displayed) Labs Reviewed  CBC WITH DIFFERENTIAL/PLATELET - Abnormal; Notable for the following components:       Result Value   Hemoglobin 12.9 (*)    MCV 76.9 (*)    MCH 24.2 (*)    RDW 19.7 (*)    Platelets 117 (*)    All other components within normal limits  COMPREHENSIVE METABOLIC PANEL - Abnormal; Notable for the following components:   CO2 20 (*)    Calcium 8.6 (*)    Total Protein 8.4 (*)    AST 51 (*)    Total Bilirubin 2.0 (*)    All other components within normal limits  LIPASE, BLOOD - Abnormal; Notable for the following components:   Lipase 77 (*)    All other components within normal limits  ETHANOL - Abnormal; Notable for the following components:   Alcohol, Ethyl (B) 205 (*)    All other components within normal limits  URINALYSIS, ROUTINE W REFLEX MICROSCOPIC  RAPID URINE DRUG SCREEN, HOSP PERFORMED    EKG EKG Interpretation  Date/Time:  Friday February 10 2023 21:16:08 EDT Ventricular Rate:  85 PR Interval:  141 QRS Duration: 97 QT Interval:  395 QTC Calculation: 470 R Axis:   68 Text Interpretation: Sinus rhythm since last tracing no significant change Confirmed by Rolan Bucco [phone]) on 02/10/2023 9:58:26 PM  Radiology No results found.  Procedures Procedures    Medications Ordered in ED Medications  ziprasidone (GEODON) injection 10 mg (10 mg Intramuscular Given 02/10/23 2114)  sterile water (preservative free) injection (  Given 02/10/23 2115)    ED Course/ Medical Decision Making/ A&P                             Medical Decision Making Risk Prescription drug management.   Patient is a 67 year old male who presents complaining of suicidal and homicidal ideations.  He also complains of abdominal pain.  He had no tenderness on my exam.  His labs show minimal elevation in his lipase consistent with possible mild pancreatitis.  His LFTs are minimally elevated.  His hemoglobin is slightly low at 12.9.  This is similar to prior values.  Although given his lack of abdominal pain on exam, I do not feel that imaging is indicated.  His EtOH level is  elevated.  Following my evaluation, he said he was leaving and that he did not think we could help him.  Given that he made a direct statement about wanting to kill his girlfriend and that he was having suicidal ideations with a plan, IVC papers were initiated and he was given a dose of Geodon.  Currently he is sleeping.  Will need a TTS evaluation once more alert.  Care turned over to Dr. Eudelia Bunch pending  this evaluation. Final Clinical Impression(s) / ED Diagnoses Final diagnoses:  Alcoholic intoxication without complication (HCC)  Suicidal ideation  Homicidal ideation    Rx / DC Orders ED Discharge Orders     None         Rolan Bucco, MD 02/10/23 2350

## 2023-02-10 NOTE — ED Triage Notes (Signed)
Pt also reports abdominal pain and vomiting all day.

## 2023-02-10 NOTE — ED Triage Notes (Signed)
Pt presents to ED for evaluation of suicidal ideations and need for alcohol detox. Pt says he wants to go to [place] for treatment but has not way to get there.

## 2023-02-10 NOTE — ED Notes (Signed)
Pt refusing labs to have labs drawn at this time.

## 2023-02-11 DIAGNOSIS — F10920 Alcohol use, unspecified with intoxication, uncomplicated: Secondary | ICD-10-CM | POA: Diagnosis not present

## 2023-02-11 LAB — URINALYSIS, ROUTINE W REFLEX MICROSCOPIC
Bacteria, UA: NONE SEEN
Bilirubin Urine: NEGATIVE
Glucose, UA: NEGATIVE mg/dL
Hgb urine dipstick: NEGATIVE
Ketones, ur: NEGATIVE mg/dL
Leukocytes,Ua: NEGATIVE
Nitrite: NEGATIVE
Protein, ur: 30 mg/dL — AB
Specific Gravity, Urine: 1.011 (ref 1.005–1.030)
pH: 5 (ref 5.0–8.0)

## 2023-02-11 LAB — RAPID URINE DRUG SCREEN, HOSP PERFORMED
Amphetamines: NOT DETECTED
Barbiturates: NOT DETECTED
Benzodiazepines: NOT DETECTED
Cocaine: NOT DETECTED
Opiates: NOT DETECTED
Tetrahydrocannabinol: NOT DETECTED

## 2023-02-11 NOTE — ED Notes (Signed)
Pt has pocket knife locked up in security office

## 2023-02-11 NOTE — ED Provider Notes (Signed)
I assumed care of this patient.  Please see previous provider's note for further details of history, exam, and MDM.   Briefly patient is a 67 y.o. male who presented for alcohol intoxication and suicidal ideation.  Patient is currently awaiting metabolizing to freedom and reassessment.  IVC due to aggression.  Labs are reassuring Allowed to metabolize  5:25 AM Patient is awake, alert denies any SI, HI, AVH.  Patient states that he drank way too much last night and was out of line.   IVC rescinded  The patient appears reasonably screened and/or stabilized for discharge and I doubt any other medical condition or other [facility] requiring further screening, evaluation, or treatment in the ED at this time. I have discussed the findings, Dx and Tx plan with the patient/family who expressed understanding and agree(s) with the plan. Discharge instructions discussed at length. The patient/family was given strict return precautions who verbalized understanding of the instructions. No further questions at time of discharge.  Disposition: Discharge  Condition: Good  ED Discharge Orders     None       Follow Up: Estevan Oaks, NP [address] [place] [address] [phone]  Call  to schedule an appointment for close follow up        [name], MD 02/11/23 [phone]

## 2023-02-11 NOTE — ED Notes (Signed)
Pt bp elevated, states he is due for his morning dose of bp meds, has med with him and is going to take prior to dc

## 2023-02-11 NOTE — ED Notes (Signed)
Belongings returned to pt, dc/follow up reviewed with pt. Instructed pt to stop by security to retrieve belongings before leaving property. Bus pass provided to pt. Ambulatory from ED

## 2023-02-16 ENCOUNTER — Other Ambulatory Visit: Payer: Self-pay

## 2023-02-16 ENCOUNTER — Encounter (HOSPITAL_COMMUNITY): Payer: Self-pay | Admitting: Emergency Medicine

## 2023-02-16 ENCOUNTER — Emergency Department (HOSPITAL_COMMUNITY)
Admission: EM | Admit: 2023-02-16 | Discharge: 2023-02-16 | Discharge status: Against Medical Advice | Payer: Medicare HMO | Attending: Emergency Medicine | Admitting: Emergency Medicine

## 2023-02-16 DIAGNOSIS — F10129 Alcohol abuse with intoxication, unspecified: Secondary | ICD-10-CM | POA: Diagnosis present

## 2023-02-16 DIAGNOSIS — F10920 Alcohol use, unspecified with intoxication, uncomplicated: Secondary | ICD-10-CM

## 2023-02-16 DIAGNOSIS — Z5329 Procedure and treatment not carried out because of patient's decision for other reasons: Secondary | ICD-10-CM | POA: Insufficient documentation

## 2023-02-16 DIAGNOSIS — Y909 Presence of alcohol in blood, level not specified: Secondary | ICD-10-CM | POA: Diagnosis not present
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
## 2023-02-16 NOTE — Discharge Instructions (Signed)
Return for any problem.  Drink alcohol in moderation. 
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
## 2023-02-23 ENCOUNTER — Encounter (HOSPITAL_COMMUNITY): Admission: EM | Disposition: A | Discharge status: Home / Self Care | Payer: Self-pay | Source: Home / Self Care

## 2023-02-23 ENCOUNTER — Emergency Department (HOSPITAL_COMMUNITY): Payer: Medicare HMO | Admitting: Anesthesiology

## 2023-02-23 ENCOUNTER — Inpatient Hospital Stay (HOSPITAL_COMMUNITY)
Admission: EM | Admit: 2023-02-23 | Discharge: 2023-04-03 | DRG: 329 | Disposition: A | Discharge status: Home / Self Care | Payer: Medicare HMO | Attending: General Surgery | Admitting: General Surgery

## 2023-02-23 ENCOUNTER — Emergency Department (HOSPITAL_COMMUNITY): Payer: Medicare HMO

## 2023-02-23 ENCOUNTER — Inpatient Hospital Stay: Admit: 2023-02-23 | Payer: Self-pay | Admitting: General Surgery

## 2023-02-23 DIAGNOSIS — F10131 Alcohol abuse with withdrawal delirium: Secondary | ICD-10-CM | POA: Diagnosis not present

## 2023-02-23 DIAGNOSIS — Z6829 Body mass index (BMI) 29.0-29.9, adult: Secondary | ICD-10-CM

## 2023-02-23 DIAGNOSIS — K9171 Accidental puncture and laceration of a digestive system organ or structure during a digestive system procedure: Secondary | ICD-10-CM | POA: Diagnosis not present

## 2023-02-23 DIAGNOSIS — F329 Major depressive disorder, single episode, unspecified: Secondary | ICD-10-CM | POA: Diagnosis present

## 2023-02-23 DIAGNOSIS — S36499A Other injury of unspecified part of small intestine, initial encounter: Secondary | ICD-10-CM | POA: Diagnosis not present

## 2023-02-23 DIAGNOSIS — Z781 Physical restraint status: Secondary | ICD-10-CM | POA: Diagnosis not present

## 2023-02-23 DIAGNOSIS — J9601 Acute respiratory failure with hypoxia: Secondary | ICD-10-CM | POA: Diagnosis not present

## 2023-02-23 DIAGNOSIS — K651 Peritoneal abscess: Secondary | ICD-10-CM | POA: Diagnosis not present

## 2023-02-23 DIAGNOSIS — X781XXA Intentional self-harm by knife, initial encounter: Secondary | ICD-10-CM | POA: Diagnosis not present

## 2023-02-23 DIAGNOSIS — Z23 Encounter for immunization: Secondary | ICD-10-CM

## 2023-02-23 DIAGNOSIS — S31634A Puncture wound without foreign body of abdominal wall, left lower quadrant with penetration into peritoneal cavity, initial encounter: Secondary | ICD-10-CM | POA: Diagnosis not present

## 2023-02-23 DIAGNOSIS — M62830 Muscle spasm of back: Secondary | ICD-10-CM | POA: Diagnosis not present

## 2023-02-23 DIAGNOSIS — Y92481 Parking lot as the place of occurrence of the external cause: Secondary | ICD-10-CM

## 2023-02-23 DIAGNOSIS — E440 Moderate protein-calorie malnutrition: Secondary | ICD-10-CM | POA: Diagnosis not present

## 2023-02-23 DIAGNOSIS — S36409A Unspecified injury of unspecified part of small intestine, initial encounter: Secondary | ICD-10-CM | POA: Diagnosis not present

## 2023-02-23 DIAGNOSIS — Y908 Blood alcohol level of 240 mg/100 ml or more: Secondary | ICD-10-CM | POA: Diagnosis not present

## 2023-02-23 DIAGNOSIS — Y733 Surgical instruments, materials and gastroenterology and urology devices (including sutures) associated with adverse incidents: Secondary | ICD-10-CM | POA: Diagnosis not present

## 2023-02-23 DIAGNOSIS — T1491XA Suicide attempt, initial encounter: Secondary | ICD-10-CM | POA: Diagnosis not present

## 2023-02-23 DIAGNOSIS — S31112A Laceration without foreign body of abdominal wall, epigastric region without penetration into peritoneal cavity, initial encounter: Secondary | ICD-10-CM | POA: Diagnosis present

## 2023-02-23 DIAGNOSIS — T1490XA Injury, unspecified, initial encounter: Principal | ICD-10-CM

## 2023-02-23 DIAGNOSIS — Z9151 Personal history of suicidal behavior: Secondary | ICD-10-CM | POA: Diagnosis not present

## 2023-02-23 DIAGNOSIS — I10 Essential (primary) hypertension: Secondary | ICD-10-CM | POA: Diagnosis not present

## 2023-02-23 DIAGNOSIS — F32A Depression, unspecified: Secondary | ICD-10-CM | POA: Diagnosis present

## 2023-02-23 DIAGNOSIS — S31109A Unspecified open wound of abdominal wall, unspecified quadrant without penetration into peritoneal cavity, initial encounter: Secondary | ICD-10-CM | POA: Diagnosis present

## 2023-02-23 DIAGNOSIS — T148XXA Other injury of unspecified body region, initial encounter: Secondary | ICD-10-CM | POA: Diagnosis present

## 2023-02-23 DIAGNOSIS — Y69 Unspecified misadventure during surgical and medical care: Secondary | ICD-10-CM | POA: Diagnosis not present

## 2023-02-23 HISTORY — DX: Alcohol abuse, uncomplicated: F10.10

## 2023-02-23 HISTORY — DX: Unspecified cirrhosis of liver: K74.60

## 2023-02-23 HISTORY — DX: Other psychoactive substance abuse, uncomplicated: F19.10

## 2023-02-23 HISTORY — DX: Essential (primary) hypertension: I10

## 2023-02-23 HISTORY — DX: Anxiety disorder, unspecified: F41.9

## 2023-02-23 HISTORY — PX: LAPAROTOMY: SHX154

## 2023-02-23 HISTORY — DX: Acute pancreatitis without necrosis or infection, unspecified: K85.90

## 2023-02-23 HISTORY — DX: Depression, unspecified: F32.A

## 2023-02-23 HISTORY — DX: Unspecified viral hepatitis C without hepatic coma: B19.20

## 2023-02-23 LAB — COMPREHENSIVE METABOLIC PANEL
ALT: 31 U/L (ref 0–44)
AST: 51 U/L — ABNORMAL HIGH (ref 15–41)
Albumin: 3.3 g/dL — ABNORMAL LOW (ref 3.5–5.0)
Alkaline Phosphatase: 70 U/L (ref 38–126)
Anion gap: 11 (ref 5–15)
BUN: 9 mg/dL (ref 8–23)
CO2: 23 mmol/L (ref 22–32)
Calcium: 8.7 mg/dL — ABNORMAL LOW (ref 8.9–10.3)
Chloride: 104 mmol/L (ref 98–111)
Creatinine, Ser: 0.78 mg/dL (ref 0.61–1.24)
GFR, Estimated: >60 mL/min (ref >60)
Glucose, Bld: 99 mg/dL (ref 70–99)
Potassium: 3.7 mmol/L (ref 3.5–5.1)
Sodium: 138 mmol/L (ref 135–145)
Total Bilirubin: 1.3 mg/dL — ABNORMAL HIGH (ref 0.3–1.2)
Total Protein: 7.6 g/dL (ref 6.5–8.1)

## 2023-02-23 LAB — LACTIC ACID, PLASMA: Lactic Acid, Venous: 1.4 mmol/L (ref 0.5–1.9)

## 2023-02-23 LAB — CBC
HCT: 38.3 % — ABNORMAL LOW (ref 39.0–52.0)
Hemoglobin: 12 g/dL — ABNORMAL LOW (ref 13.0–17.0)
MCH: 24.8 pg — ABNORMAL LOW (ref 26.0–34.0)
MCHC: 31.3 g/dL (ref 30.0–36.0)
MCV: 79.3 fL — ABNORMAL LOW (ref 80.0–100.0)
Platelets: 90 10*3/uL — ABNORMAL LOW (ref 150–400)
RBC: 4.83 MIL/uL (ref 4.22–5.81)
RDW: 21.2 % — ABNORMAL HIGH (ref 11.5–15.5)
WBC: 4.3 10*3/uL (ref 4.0–10.5)
nRBC: 0 % (ref 0.0–0.2)

## 2023-02-23 LAB — POCT I-STAT, CHEM 8
BUN: 10 mg/dL (ref 8–23)
Calcium, Ion: 1.08 mmol/L — ABNORMAL LOW (ref 1.15–1.40)
Chloride: 105 mmol/L (ref 98–111)
Creatinine, Ser: 1.1 mg/dL (ref 0.61–1.24)
Glucose, Bld: 97 mg/dL (ref 70–99)
HCT: 40 % (ref 39.0–52.0)
Hemoglobin: 13.6 g/dL (ref 13.0–17.0)
Potassium: 3.8 mmol/L (ref 3.5–5.1)
Sodium: 142 mmol/L (ref 135–145)
TCO2: 24 mmol/L (ref 22–32)

## 2023-02-23 LAB — ETHANOL: Alcohol, Ethyl (B): 284 mg/dL — ABNORMAL HIGH (ref <10)

## 2023-02-23 LAB — I-STAT CHEM 8, ED
BUN: 10 mg/dL (ref 8–23)
Calcium, Ion: 1.08 mmol/L — ABNORMAL LOW (ref 1.15–1.40)
Chloride: 105 mmol/L (ref 98–111)
Creatinine, Ser: 1.1 mg/dL — ABNORMAL HIGH (ref 0.44–1.00)
Glucose, Bld: 97 mg/dL (ref 70–99)
HCT: 40 % (ref 36.0–46.0)
Hemoglobin: 13.6 g/dL (ref 12.0–15.0)
Potassium: 3.8 mmol/L (ref 3.5–5.1)
Sodium: 142 mmol/L (ref 135–145)
TCO2: 24 mmol/L (ref 22–32)

## 2023-02-23 LAB — ABO/RH: ABO/RH(D): O POS

## 2023-02-23 LAB — PROTIME-INR
INR: 1.2 (ref 0.8–1.2)
Prothrombin Time: 15.1 seconds (ref 11.4–15.2)

## 2023-02-23 LAB — TYPE AND SCREEN
ABO/RH(D): O POS
Antibody Screen: NEGATIVE

## 2023-02-23 SURGERY — LAPAROTOMY, EXPLORATORY
Anesthesia: General | Site: Abdomen

## 2023-02-23 MED ORDER — MIDAZOLAM HCL 2 MG/2ML IJ SOLN
INTRAMUSCULAR | Status: AC
  Filled 2023-02-23: qty 2

## 2023-02-23 MED ORDER — FENTANYL BOLUS VIA INFUSION: 25.0000 ug | INTRAVENOUS | Status: DC | PRN

## 2023-02-23 MED ORDER — DEXAMETHASONE SODIUM PHOSPHATE 10 MG/ML IJ SOLN
INTRAMUSCULAR | Status: DC | PRN
  Administered 2023-02-23: 10 mg via INTRAVENOUS

## 2023-02-23 MED ORDER — MIDAZOLAM HCL 2 MG/2ML IJ SOLN
INTRAMUSCULAR | Status: DC | PRN
  Administered 2023-02-23: 2 mg via INTRAVENOUS

## 2023-02-23 MED ORDER — PROPOFOL 10 MG/ML IV BOLUS
INTRAVENOUS | Status: DC | PRN
  Administered 2023-02-23: 2200 ug via INTRAVENOUS

## 2023-02-23 MED ORDER — EPHEDRINE SULFATE-NACL 50-0.9 MG/10ML-% IV SOSY
PREFILLED_SYRINGE | INTRAVENOUS | Status: DC | PRN
  Administered 2023-02-23 (×3): 5 mg via INTRAVENOUS
  Administered 2023-02-23: 10 mg via INTRAVENOUS
  Administered 2023-02-23: 5 mg via INTRAVENOUS

## 2023-02-23 MED ORDER — ONDANSETRON HCL 4 MG/2ML IJ SOLN
INTRAMUSCULAR | Status: DC | PRN
  Administered 2023-02-23: 4 mg via INTRAVENOUS

## 2023-02-23 MED ORDER — FENTANYL 2500MCG IN NS 250ML (10MCG/ML) PREMIX INFUSION: 25.0000 ug/h | INTRAVENOUS | Status: DC

## 2023-02-23 MED ORDER — ALBUMIN HUMAN 5 % IV SOLN: INTRAVENOUS | Status: DC | PRN

## 2023-02-23 MED ORDER — FENTANYL CITRATE (PF) 250 MCG/5ML IJ SOLN
INTRAMUSCULAR | Status: AC
  Filled 2023-02-23: qty 5

## 2023-02-23 MED ORDER — SUCCINYLCHOLINE CHLORIDE 20 MG/ML IJ SOLN
INTRAMUSCULAR | Status: DC | PRN
  Administered 2023-02-23: 150 mg via INTRAVENOUS

## 2023-02-23 MED ORDER — FENTANYL CITRATE (PF) 250 MCG/5ML IJ SOLN
INTRAMUSCULAR | Status: DC | PRN
  Administered 2023-02-23: 50 ug via INTRAVENOUS

## 2023-02-23 MED ORDER — LACTATED RINGERS IV SOLN: INTRAVENOUS | Status: DC | PRN

## 2023-02-23 MED ORDER — PROPOFOL 500 MG/50ML IV EMUL
INTRAVENOUS | Status: DC | PRN
  Administered 2023-02-23: 30 ug/kg/min via INTRAVENOUS

## 2023-02-23 MED ORDER — SODIUM CHLORIDE 0.9 % IV SOLN
2.0000 g | Freq: Once | INTRAVENOUS | Status: DC
  Administered 2023-02-23: 2 g via INTRAVENOUS

## 2023-02-23 MED ORDER — ETOMIDATE 2 MG/ML IV SOLN
INTRAVENOUS | Status: DC | PRN
  Administered 2023-02-23: 20 mg via INTRAVENOUS

## 2023-02-23 MED ORDER — ROCURONIUM BROMIDE 10 MG/ML (PF) SYRINGE
PREFILLED_SYRINGE | INTRAVENOUS | Status: DC | PRN
  Administered 2023-02-23: 50 mg via INTRAVENOUS
  Administered 2023-02-24: 30 mg via INTRAVENOUS

## 2023-02-23 MED ORDER — TETANUS-DIPHTH-ACELL PERTUSSIS 5-2.5-18.5 LF-MCG/0.5 IM SUSY
0.5000 mL | PREFILLED_SYRINGE | Freq: Once | INTRAMUSCULAR | Status: AC
  Administered 2023-02-24: 0.5 mL via INTRAMUSCULAR
  Filled 2023-02-23 (×2): qty 0.5

## 2023-02-23 MED ORDER — CEFAZOLIN SODIUM-DEXTROSE 2-4 GM/100ML-% IV SOLN
2.0000 g | Freq: Once | INTRAVENOUS | Status: AC
  Administered 2023-02-23: 2 g via INTRAVENOUS
  Filled 2023-02-23: qty 100

## 2023-02-23 MED ORDER — 0.9 % SODIUM CHLORIDE (POUR BTL) OPTIME
TOPICAL | Status: DC | PRN
  Administered 2023-02-23: 3000 mL

## 2023-02-23 SURGICAL SUPPLY — 36 items
APL PRP STRL LF DISP 70% ISPRP (MISCELLANEOUS)
BAG COUNTER SPONGE SURGICOUNT (BAG) ×2 IMPLANT
BAG SPNG CNTER NS LX DISP (BAG) ×1
CHLORAPREP W/TINT 26 (MISCELLANEOUS) ×2 IMPLANT
COVER SURGICAL LIGHT HANDLE (MISCELLANEOUS) ×2 IMPLANT
DRAPE LAPAROSCOPIC ABDOMINAL (DRAPES) ×2 IMPLANT
DRAPE WARM FLUID 44X44 (DRAPES) ×2 IMPLANT
DRSG MEPILEX POST OP 4X12 (GAUZE/BANDAGES/DRESSINGS) IMPLANT
DRSG OPSITE POSTOP 4X10 (GAUZE/BANDAGES/DRESSINGS) IMPLANT
DRSG OPSITE POSTOP 4X8 (GAUZE/BANDAGES/DRESSINGS) IMPLANT
ELECT BLADE 6.5 EXT (BLADE) IMPLANT
ELECT CAUTERY BLADE 6.4 (BLADE) ×2 IMPLANT
ELECT REM PT RETURN 9FT ADLT (ELECTROSURGICAL) ×1
ELECTRODE REM PT RTRN 9FT ADLT (ELECTROSURGICAL) ×2 IMPLANT
GLOVE BIOGEL PI IND STRL 7.0 (GLOVE) ×2 IMPLANT
GLOVE SURG SS PI 7.0 STRL IVOR (GLOVE) ×2 IMPLANT
GOWN STRL REUS W/ TWL LRG LVL3 (GOWN DISPOSABLE) ×4 IMPLANT
GOWN STRL REUS W/TWL LRG LVL3 (GOWN DISPOSABLE) ×2
HANDLE SUCTION POOLE (INSTRUMENTS) ×2 IMPLANT
KIT BASIN OR (CUSTOM PROCEDURE TRAY) ×2 IMPLANT
LIGASURE IMPACT 36 18CM CVD LR (INSTRUMENTS) IMPLANT
PACK GENERAL/GYN (CUSTOM PROCEDURE TRAY) ×2 IMPLANT
PENCIL SMOKE EVACUATOR (MISCELLANEOUS) ×2 IMPLANT
SPONGE T-LAP 18X18 ~~LOC~~+RFID (SPONGE) IMPLANT
STAPLER VISISTAT 35W (STAPLE) ×2 IMPLANT
SUCTION POOLE HANDLE (INSTRUMENTS) ×1
SUT PDS AB 0 CT1 27 (SUTURE) IMPLANT
SUT PDS AB 3-0 SH 27 (SUTURE) ×4 IMPLANT
SUT SILK 2 0 SH CR/8 (SUTURE) ×2 IMPLANT
SUT SILK 2 0 TIES 10X30 (SUTURE) ×2 IMPLANT
SUT SILK 3 0 SH CR/8 (SUTURE) ×2 IMPLANT
SUT SILK 3 0 TIES 10X30 (SUTURE) ×2 IMPLANT
SYR BULB IRRIG 60ML STRL (SYRINGE) IMPLANT
TOWEL GREEN STERILE (TOWEL DISPOSABLE) ×2 IMPLANT
TRAY FOLEY MTR SLVR 16FR STAT (SET/KITS/TRAYS/PACK) ×2 IMPLANT
YANKAUER SUCT BULB TIP NO VENT (SUCTIONS) IMPLANT

## 2023-02-23 NOTE — Anesthesia Preprocedure Evaluation (Addendum)
Anesthesia Evaluation  Patient identified by MRN, date of birth, ID band Patient unresponsive    Reviewed: Unable to perform ROS - Chart review onlyPreop documentation limited or incomplete due to emergent nature of procedure.  Airway Mallampati: Intubated       Dental   Pulmonary    Pulmonary exam normal        Cardiovascular  Rhythm:Regular Rate:Normal     Neuro/Psych    GI/Hepatic   Endo/Other    Renal/GU      Musculoskeletal   Abdominal   Peds  Hematology   Anesthesia Other Findings   Reproductive/Obstetrics                             Anesthesia Physical Anesthesia Plan  ASA: 4 and emergent  Anesthesia Plan: General   Post-op Pain Management: Dilaudid IV and Toradol IV (intra-op)*   Induction: Intravenous  PONV Risk Score and Plan: 4 or greater  Airway Management Planned: Oral ETT  Additional Equipment: Arterial line  Intra-op Plan:   Post-operative Plan: Post-operative intubation/ventilation  Informed Consent:   Plan Discussed with: Anesthesiologist, CRNA and Surgeon  Anesthesia Plan Comments:        Anesthesia Quick Evaluation
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
## 2023-02-23 NOTE — Anesthesia Procedure Notes (Signed)
Arterial Line Insertion Start/End5/06/2023 10:44 PM, 02/23/2023 10:54 PM Performed by: Claudina Lick, CRNA, CRNA  Patient location: OR. Preanesthetic checklist: patient identified, IV checked, site marked, risks and benefits discussed, surgical consent, monitors and equipment checked, pre-op evaluation, timeout performed and anesthesia consent Emergency situation radial was placed Catheter size: 20 G Hand hygiene performed  and maximum sterile barriers used   Attempts: 1 Procedure performed without using ultrasound guided technique. Ultrasound Notes:anatomy identified, needle tip was noted to be adjacent to the nerve/plexus identified and no ultrasound evidence of intravascular and/or intraneural injection Following insertion, dressing applied and Biopatch. Post procedure assessment: normal and unchanged  Patient tolerated the procedure well with no immediate complications.

## 2023-02-23 NOTE — TOC CAGE-AID Note (Signed)
Transition of Care [facility]) - CAGE-AID Screening   Patient Details  Name: [name] MRN: [id number] Date of Birth: [birth year]  Transition of Care [facility]) CM/SW Contact:    Katha Hamming, RN Phone Number:[phone] 02/23/2023, 11:08 PM   CAGE-AID Screening: Substance Abuse Screening unable to be completed due to: : Patient Refused (pt currently intoxicated, combative and will not answer questions.)     Known hx of alcohol abuse, will need CIWAs and TOC consult
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
## 2023-02-24 ENCOUNTER — Other Ambulatory Visit: Payer: Self-pay

## 2023-02-24 ENCOUNTER — Encounter (HOSPITAL_COMMUNITY): Payer: Self-pay

## 2023-02-24 DIAGNOSIS — Z23 Encounter for immunization: Secondary | ICD-10-CM | POA: Diagnosis not present

## 2023-02-24 DIAGNOSIS — S36499A Other injury of unspecified part of small intestine, initial encounter: Secondary | ICD-10-CM | POA: Diagnosis not present

## 2023-02-24 DIAGNOSIS — K651 Peritoneal abscess: Secondary | ICD-10-CM | POA: Diagnosis not present

## 2023-02-24 DIAGNOSIS — S31109A Unspecified open wound of abdominal wall, unspecified quadrant without penetration into peritoneal cavity, initial encounter: Secondary | ICD-10-CM | POA: Diagnosis present

## 2023-02-24 DIAGNOSIS — J9601 Acute respiratory failure with hypoxia: Secondary | ICD-10-CM | POA: Diagnosis not present

## 2023-02-24 LAB — POCT I-STAT 7, (LYTES, BLD GAS, ICA,H+H)
Acid-base deficit: 3 mmol/L — ABNORMAL HIGH (ref 0.0–2.0)
Bicarbonate: 23 mmol/L (ref 20.0–28.0)
Calcium, Ion: 1.14 mmol/L — ABNORMAL LOW (ref 1.15–1.40)
HCT: 35 % — ABNORMAL LOW (ref 39.0–52.0)
Hemoglobin: 11.9 g/dL — ABNORMAL LOW (ref 13.0–17.0)
O2 Saturation: 100 %
Patient temperature: 35.4
Potassium: 3.6 mmol/L (ref 3.5–5.1)
Sodium: 141 mmol/L (ref 135–145)
TCO2: 24 mmol/L (ref 22–32)
pCO2 arterial: 43.1 mmHg (ref 32–48)
pH, Arterial: 7.327 — ABNORMAL LOW (ref 7.35–7.45)
pO2, Arterial: 490 mmHg — ABNORMAL HIGH (ref 83–108)

## 2023-02-24 LAB — HIV ANTIBODY (ROUTINE TESTING W REFLEX): HIV Screen 4th Generation wRfx: NONREACTIVE

## 2023-02-24 LAB — CBC
HCT: 30 % — ABNORMAL LOW (ref 39.0–52.0)
Hemoglobin: 9.5 g/dL — ABNORMAL LOW (ref 13.0–17.0)
MCH: 24.9 pg — ABNORMAL LOW (ref 26.0–34.0)
MCHC: 31.7 g/dL (ref 30.0–36.0)
MCV: 78.5 fL — ABNORMAL LOW (ref 80.0–100.0)
Platelets: 75 10*3/uL — ABNORMAL LOW (ref 150–400)
RBC: 3.82 MIL/uL — ABNORMAL LOW (ref 4.22–5.81)
RDW: 20.7 % — ABNORMAL HIGH (ref 11.5–15.5)
WBC: 3.6 10*3/uL — ABNORMAL LOW (ref 4.0–10.5)
nRBC: 0 % (ref 0.0–0.2)

## 2023-02-24 LAB — TSH: TSH: 1.384 u[IU]/mL (ref 0.350–4.500)

## 2023-02-24 LAB — VITAMIN B12: Vitamin B-12: 216 pg/mL (ref 180–914)

## 2023-02-24 LAB — CREATININE, SERUM
Creatinine, Ser: 0.73 mg/dL (ref 0.61–1.24)
GFR, Estimated: >60 mL/min (ref >60)

## 2023-02-24 LAB — MRSA NEXT GEN BY PCR, NASAL: MRSA by PCR Next Gen: NOT DETECTED

## 2023-02-24 LAB — FOLATE: Folate: 11.5 ng/mL (ref >5.9)

## 2023-02-24 MED ORDER — ORAL CARE MOUTH RINSE: 15.0000 mL | OROMUCOSAL | Status: DC | PRN

## 2023-02-24 MED ORDER — DOCUSATE SODIUM 50 MG/5ML PO LIQD
100.0000 mg | Freq: Two times a day (BID) | ORAL | Status: DC
  Administered 2023-02-27: 100 mg via ORAL
  Filled 2023-02-24 (×3): qty 10

## 2023-02-24 MED ORDER — FENTANYL CITRATE PF 50 MCG/ML IJ SOSY
25.0000 ug | PREFILLED_SYRINGE | Freq: Once | INTRAMUSCULAR | Status: AC
  Administered 2023-02-24: 25 ug via INTRAVENOUS

## 2023-02-24 MED ORDER — DOCUSATE SODIUM 50 MG/5ML PO LIQD
100.0000 mg | Freq: Two times a day (BID) | ORAL | Status: DC
  Administered 2023-02-24 (×2): 100 mg
  Filled 2023-02-24 (×2): qty 10

## 2023-02-24 MED ORDER — LORAZEPAM 2 MG/ML IJ SOLN
1.0000 mg | INTRAMUSCULAR | Status: DC | PRN
  Administered 2023-02-24 – 2023-03-20 (×24): 1 mg via INTRAVENOUS
  Filled 2023-02-24 (×25): qty 1

## 2023-02-24 MED ORDER — ONDANSETRON 4 MG PO TBDP: 4.0000 mg | ORAL_TABLET | Freq: Four times a day (QID) | ORAL | Status: DC | PRN

## 2023-02-24 MED ORDER — METOPROLOL TARTRATE 5 MG/5ML IV SOLN
5.0000 mg | Freq: Four times a day (QID) | INTRAVENOUS | Status: DC | PRN
  Administered 2023-02-26 – 2023-03-17 (×3): 5 mg via INTRAVENOUS
  Filled 2023-02-24 (×4): qty 5

## 2023-02-24 MED ORDER — ACETAMINOPHEN 325 MG PO TABS: 650.0000 mg | ORAL_TABLET | ORAL | Status: DC | PRN

## 2023-02-24 MED ORDER — MORPHINE SULFATE (PF) 2 MG/ML IV SOLN
2.0000 mg | INTRAVENOUS | Status: DC | PRN
  Administered 2023-02-24 (×2): 2 mg via INTRAVENOUS
  Administered 2023-02-26 – 2023-03-01 (×5): 4 mg via INTRAVENOUS
  Administered 2023-03-06 – 2023-03-08 (×9): 2 mg via INTRAVENOUS
  Administered 2023-03-09 (×2): 4 mg via INTRAVENOUS
  Administered 2023-03-11 – 2023-03-14 (×14): 2 mg via INTRAVENOUS
  Administered 2023-03-14: 4 mg via INTRAVENOUS
  Administered 2023-03-14 – 2023-03-18 (×7): 2 mg via INTRAVENOUS
  Filled 2023-02-24: qty 2
  Filled 2023-02-24 (×11): qty 1
  Filled 2023-02-24: qty 2
  Filled 2023-02-24: qty 1
  Filled 2023-02-24 (×3): qty 2
  Filled 2023-02-24 (×3): qty 1
  Filled 2023-02-24: qty 2
  Filled 2023-02-24 (×4): qty 1
  Filled 2023-02-24: qty 2
  Filled 2023-02-24 (×5): qty 1
  Filled 2023-02-24: qty 2
  Filled 2023-02-24 (×11): qty 1

## 2023-02-24 MED ORDER — FENTANYL BOLUS VIA INFUSION: 25.0000 ug | INTRAVENOUS | Status: DC | PRN

## 2023-02-24 MED ORDER — FENTANYL 2500MCG IN NS 250ML (10MCG/ML) PREMIX INFUSION
25.0000 ug/h | INTRAVENOUS | Status: DC
  Administered 2023-02-24: 100 ug/h via INTRAVENOUS
  Filled 2023-02-24: qty 250

## 2023-02-24 MED ORDER — HALOPERIDOL LACTATE 5 MG/ML IJ SOLN
1.0000 mg | Freq: Four times a day (QID) | INTRAMUSCULAR | Status: DC | PRN
  Administered 2023-02-24 – 2023-02-26 (×6): 2.5 mg via INTRAVENOUS
  Filled 2023-02-24 (×7): qty 1

## 2023-02-24 MED ORDER — PHENOBARBITAL SODIUM 65 MG/ML IJ SOLN: 32.5000 mg | Freq: Three times a day (TID) | INTRAMUSCULAR | Status: DC

## 2023-02-24 MED ORDER — THIAMINE HCL 100 MG/ML IJ SOLN
500.0000 mg | INTRAVENOUS | Status: AC
  Administered 2023-02-24 – 2023-02-26 (×3): 500 mg via INTRAVENOUS
  Filled 2023-02-24 (×3): qty 5

## 2023-02-24 MED ORDER — CLONIDINE HCL 0.2 MG PO TABS
0.3000 mg | ORAL_TABLET | Freq: Four times a day (QID) | ORAL | Status: DC
  Administered 2023-02-24 (×4): 0.3 mg
  Filled 2023-02-24 (×4): qty 1

## 2023-02-24 MED ORDER — OLANZAPINE 5 MG PO TBDP: 10.0000 mg | ORAL_TABLET | Freq: Three times a day (TID) | ORAL | Status: DC | PRN

## 2023-02-24 MED ORDER — SODIUM CHLORIDE 0.9 % IV SOLN: INTRAVENOUS | Status: AC

## 2023-02-24 MED ORDER — PROPOFOL 1000 MG/100ML IV EMUL
0.0000 ug/kg/min | INTRAVENOUS | Status: DC
  Administered 2023-02-24: 40 ug/kg/min via INTRAVENOUS
  Filled 2023-02-24 (×2): qty 100

## 2023-02-24 MED ORDER — PHENOBARBITAL SODIUM 130 MG/ML IJ SOLN
97.5000 mg | Freq: Three times a day (TID) | INTRAMUSCULAR | Status: AC
  Administered 2023-02-24 – 2023-02-26 (×6): 97.5 mg via INTRAVENOUS
  Filled 2023-02-24 (×6): qty 1

## 2023-02-24 MED ORDER — ENOXAPARIN SODIUM 30 MG/0.3ML IJ SOSY
30.0000 mg | PREFILLED_SYRINGE | Freq: Two times a day (BID) | INTRAMUSCULAR | Status: DC
  Administered 2023-02-26 – 2023-04-03 (×70): 30 mg via SUBCUTANEOUS
  Filled 2023-02-24 (×75): qty 0.3

## 2023-02-24 MED ORDER — CLONIDINE HCL 0.1 MG PO TABS
0.3000 mg | ORAL_TABLET | Freq: Four times a day (QID) | ORAL | Status: DC
  Administered 2023-02-25 – 2023-03-01 (×12): 0.3 mg via ORAL
  Filled 2023-02-24: qty 3
  Filled 2023-02-24: qty 1
  Filled 2023-02-24: qty 3
  Filled 2023-02-24: qty 1
  Filled 2023-02-24: qty 3
  Filled 2023-02-24 (×2): qty 1
  Filled 2023-02-24 (×4): qty 3
  Filled 2023-02-24 (×2): qty 1
  Filled 2023-02-24 (×2): qty 3
  Filled 2023-02-24: qty 1

## 2023-02-24 MED ORDER — POLYETHYLENE GLYCOL 3350 17 G PO PACK
17.0000 g | PACK | Freq: Every day | ORAL | Status: DC
  Administered 2023-02-25 – 2023-02-27 (×2): 17 g
  Filled 2023-02-24 (×3): qty 1

## 2023-02-24 MED ORDER — PANTOPRAZOLE SODIUM 40 MG IV SOLR
40.0000 mg | Freq: Every day | INTRAVENOUS | Status: DC
  Administered 2023-02-24 – 2023-03-04 (×4): 40 mg via INTRAVENOUS
  Filled 2023-02-24 (×4): qty 10

## 2023-02-24 MED ORDER — ORAL CARE MOUTH RINSE
15.0000 mL | OROMUCOSAL | Status: DC
  Administered 2023-02-24 (×7): 15 mL via OROMUCOSAL

## 2023-02-24 MED ORDER — ACETAMINOPHEN 325 MG PO TABS
650.0000 mg | ORAL_TABLET | ORAL | Status: DC | PRN
  Administered 2023-02-25 – 2023-03-31 (×20): 650 mg via ORAL
  Filled 2023-02-24 (×20): qty 2

## 2023-02-24 MED ORDER — DOCUSATE SODIUM 100 MG PO CAPS
100.0000 mg | ORAL_CAPSULE | Freq: Two times a day (BID) | ORAL | Status: DC
  Administered 2023-02-24 – 2023-02-25 (×3): 100 mg via ORAL
  Filled 2023-02-24 (×4): qty 1

## 2023-02-24 MED ORDER — PANTOPRAZOLE SODIUM 40 MG PO TBEC
40.0000 mg | DELAYED_RELEASE_TABLET | Freq: Every day | ORAL | Status: DC
  Administered 2023-02-25 – 2023-04-03 (×32): 40 mg via ORAL
  Filled 2023-02-24 (×37): qty 1

## 2023-02-24 MED ORDER — CHLORHEXIDINE GLUCONATE CLOTH 2 % EX PADS
6.0000 | MEDICATED_PAD | Freq: Every day | CUTANEOUS | Status: DC
  Administered 2023-02-24: 6 via TOPICAL

## 2023-02-24 MED ORDER — ONDANSETRON HCL 4 MG/2ML IJ SOLN
4.0000 mg | Freq: Four times a day (QID) | INTRAMUSCULAR | Status: DC | PRN
  Administered 2023-03-05 – 2023-03-14 (×3): 4 mg via INTRAVENOUS
  Filled 2023-02-24 (×3): qty 2

## 2023-02-24 MED ORDER — LORAZEPAM 1 MG PO TABS
1.0000 mg | ORAL_TABLET | ORAL | Status: AC | PRN
  Administered 2023-02-24: 1 mg via ORAL
  Filled 2023-02-24: qty 1

## 2023-02-24 MED ORDER — POLYETHYLENE GLYCOL 3350 17 G PO PACK
17.0000 g | PACK | Freq: Every day | ORAL | Status: DC
  Administered 2023-02-24: 17 g
  Filled 2023-02-24: qty 1

## 2023-02-24 MED ORDER — ZIPRASIDONE MESYLATE 20 MG IM SOLR: 20.0000 mg | INTRAMUSCULAR | Status: DC | PRN

## 2023-02-24 MED ORDER — PHENOBARBITAL SODIUM 65 MG/ML IJ SOLN
65.0000 mg | Freq: Three times a day (TID) | INTRAMUSCULAR | Status: AC
  Administered 2023-02-26 – 2023-02-28 (×6): 65 mg via INTRAVENOUS
  Filled 2023-02-24 (×6): qty 1

## 2023-02-24 MED ORDER — CALCIUM GLUCONATE-NACL 2-0.675 GM/100ML-% IV SOLN
2.0000 g | Freq: Once | INTRAVENOUS | Status: AC
  Administered 2023-02-24: 2000 mg via INTRAVENOUS
  Filled 2023-02-24: qty 100

## 2023-02-24 MED ORDER — QUETIAPINE FUMARATE 25 MG PO TABS
50.0000 mg | ORAL_TABLET | Freq: Two times a day (BID) | ORAL | Status: DC
  Administered 2023-02-24 – 2023-02-25 (×5): 50 mg
  Filled 2023-02-24 (×5): qty 2

## 2023-02-24 NOTE — Progress Notes (Signed)
PT Cancellation Note  Patient Details Name: [name] MRN: [id number] DOB: Nov 17, 1955   Cancelled Treatment:    Reason Eval/Treat Not Completed: Fatigue/lethargy limiting ability to participate. Pt recently received ativan due to agitation, somnolent at this time. PT will follow up tomorrow.   Arlyss Gandy 02/24/2023, 3:16 PM
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
## 2023-02-24 NOTE — Progress Notes (Signed)
RT and CRNA transported vent patient from OR to 4N room 24. Vital signs stable through out.

## 2023-02-24 NOTE — Progress Notes (Signed)
Throughout the shift completed infusion verify for I/O. The pump was not associated with the Propofol or Fentanyl, so the infusion volumes and rates were incorrect. I tried to reassociate the pump and was unsuccessful. I tried to modify the rate and dose changes, but the incorrect volumes and rates remained incorrect due to the infusion verify.   Rate/dose 15mcg/hr at 0700 Rate/dose titrated down to 133mcg/hr at 1100 Rate/dose titrated down to 155mcg/hr at 1115 Rate/dose titrated down to 159mcg/hr at 1130 Fentanyl stopped and turned off at 1200 at extubation.   Propofol stopped and turned off at 1200 prior to extubation.

## 2023-02-24 NOTE — Progress Notes (Signed)
PT Cancellation Note  Patient Details Name: [name] MRN: [id number] DOB: 07/23/1956   Cancelled Treatment:    Reason Eval/Treat Not Completed: Other (comment) per discussion with RN, plan is to extubate pt later today, will continue to follow and evaluate after extubation.   Vickki Muff, PT, DPT   Acute Rehabilitation Department Office [phone] Secure Chat Communication Preferred   Ronnie Derby 02/24/2023, 3:04 PM

## 2023-02-24 NOTE — Progress Notes (Signed)
IVC complete, case # [id number] in [employer]Courts file and serve. [employer] called for service.   Dellie Burns, MSW, LCSW [phone] (coverage)
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
## 2023-02-24 NOTE — Anesthesia Postprocedure Evaluation (Signed)
Anesthesia Post Note  Patient: [name]  Procedure(s) Performed: EXPLORATORY LAPAROTOMY (Abdomen)     Patient location during evaluation: SICU Anesthesia Type: General Level of consciousness: sedated Pain management: pain level controlled Vital Signs Assessment: post-procedure vital signs reviewed and stable Respiratory status: patient remains intubated per anesthesia plan Cardiovascular status: stable Postop Assessment: no apparent nausea or vomiting Anesthetic complications: no  No notable events documented.  Last Vitals:  Vitals:   02/23/23 2235 02/24/23 0030  BP: 136/81 129/73  Pulse: 75 70  Resp: 19 (!) 23  Temp:  (!) 36.4 C  SpO2: 100% 100%    Last Pain:  Vitals:   02/23/23 2233  TempSrc: Temporal                 Addalee Kavanagh DANIEL

## 2023-02-24 NOTE — Transfer of Care (Signed)
Immediate Anesthesia Transfer of Care Note  Patient: [name]  Procedure(s) Performed: EXPLORATORY LAPAROTOMY (Abdomen)  Patient Location: ICU  Anesthesia Type:General  Level of Consciousness: Patient remains intubated per anesthesia plan  Airway & Oxygen Therapy: Patient remains intubated per anesthesia plan and Patient placed on Ventilator (see vital sign flow sheet for setting)  Post-op Assessment: Report given to RN and Post -op Vital signs reviewed and stable  Post vital signs: Reviewed and stable  Last Vitals:  Vitals Value Taken Time  BP 129/73 02/24/23 0030  Temp    Pulse 67 02/24/23 0032  Resp 17 02/24/23 0032  SpO2 100 % 02/24/23 0032  Vitals shown include unvalidated device data.  Last Pain:  Vitals:   02/23/23 2233  TempSrc: Temporal         Complications: No notable events documented.
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
## 2023-02-24 NOTE — Procedures (Signed)
Extubation Procedure Note  Patient Details:   Name: [name] DOB: [birth year] MRN: [id number]   Airway Documentation:    Vent end date: 02/24/23 Vent end time: 1210   Evaluation  O2 sats: stable throughout Complications: No apparent complications Patient did tolerate procedure well. Bilateral Breath Sounds: Diminished   Yes  Pt extubated per MD order. Placed on 2L [place]. Positive cuff leak noted, no stridor heard. Vitals stable throughout. RT will continue to monitor.   Vicente Masson 02/24/2023, 12:10 PM

## 2023-02-25 LAB — RAPID URINE DRUG SCREEN, HOSP PERFORMED
Amphetamines: NOT DETECTED
Barbiturates: POSITIVE — AB
Benzodiazepines: POSITIVE — AB
Cocaine: NOT DETECTED
Opiates: POSITIVE — AB
Tetrahydrocannabinol: NOT DETECTED

## 2023-02-25 MED ORDER — CHLORHEXIDINE GLUCONATE CLOTH 2 % EX PADS
6.0000 | MEDICATED_PAD | Freq: Every day | CUTANEOUS | Status: DC
  Administered 2023-02-25 – 2023-03-28 (×26): 6 via TOPICAL
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
## 2023-02-25 NOTE — Plan of Care (Signed)
  Problem: Clinical Measurements: Goal: Will remain free from infection Outcome: Progressing Goal: Respiratory complications will improve Outcome: Progressing Goal: Cardiovascular complication will be avoided Outcome: Progressing   Problem: Activity: Goal: Risk for activity intolerance will decrease Outcome: Progressing   Problem: Safety: Goal: Ability to remain free from injury will improve Outcome: Progressing   Problem: Skin Integrity: Goal: Risk for impaired skin integrity will decrease Outcome: Progressing   Problem: Nutrition: Goal: Adequate fluids and nutrition will be maintained Outcome: Progressing   Problem: Sleep Hygiene: Goal: Ability to obtain adequate restful sleep will improve Outcome: Progressing   Problem: Education: Goal: Knowledge of General Education information will improve Description: Including pain rating scale, medication(s)/side effects and non-pharmacologic comfort measures Outcome: Not Progressing   Problem: Health Behavior/Discharge Planning: Goal: Ability to manage health-related needs will improve Outcome: Not Progressing   Problem: Clinical Measurements: Goal: Ability to maintain clinical measurements within normal limits will improve Outcome: Not Progressing Goal: Diagnostic test results will improve Outcome: Not Progressing   Problem: Nutrition: Goal: Adequate nutrition will be maintained Outcome: Not Progressing   Problem: Coping: Goal: Level of anxiety will decrease Outcome: Not Progressing   Problem: Elimination: Goal: Will not experience complications related to bowel motility Outcome: Not Progressing Goal: Will not experience complications related to urinary retention Outcome: Not Progressing   Problem: Pain Managment: Goal: General experience of comfort will improve Outcome: Not Progressing   Problem: Education: Goal: Knowledge of warning signs, risks, and behaviors that relate to suicide ideation and self-harm  behaviors will improve Outcome: Not Progressing   Problem: Health Behavior/Discharge (Transition) Planning: Goal: Ability to manage health-related needs will improve Outcome: Not Progressing   Problem: Clinical Measurements: Goal: Remain free from any harm during hospitalization Outcome: Not Progressing   Problem: Coping: Goal: Ability to disclose and discuss thoughts of suicide and self-harm will improve Outcome: Not Progressing   Problem: Medication Management: Goal: Adhere to prescribed medication regimen Outcome: Not Progressing   Problem: Self Esteem: Goal: Ability to verbalize positive feeling about self will improve Outcome: Not Progressing   Problem: Safety: Goal: Non-violent Restraint(s) Outcome: Not Progressing
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
## 2023-02-25 NOTE — Plan of Care (Signed)
  Problem: Clinical Measurements: Goal: Ability to maintain clinical measurements within normal limits will improve Outcome: Progressing Goal: Will remain free from infection Outcome: Progressing Goal: Diagnostic test results will improve Outcome: Progressing Goal: Respiratory complications will improve Outcome: Progressing Goal: Cardiovascular complication will be avoided Outcome: Progressing   Problem: Activity: Goal: Risk for activity intolerance will decrease Outcome: Progressing   Problem: Elimination: Goal: Will not experience complications related to urinary retention Outcome: Progressing   Problem: Medication Management: Goal: Adhere to prescribed medication regimen Outcome: Progressing   Problem: Sleep Hygiene: Goal: Ability to obtain adequate restful sleep will improve Outcome: Progressing   Problem: Education: Goal: Knowledge of General Education information will improve Description: Including pain rating scale, medication(s)/side effects and non-pharmacologic comfort measures Outcome: Not Progressing   Problem: Health Behavior/Discharge Planning: Goal: Ability to manage health-related needs will improve Outcome: Not Progressing   Problem: Nutrition: Goal: Adequate nutrition will be maintained Outcome: Not Progressing   Problem: Coping: Goal: Level of anxiety will decrease Outcome: Not Progressing   Problem: Elimination: Goal: Will not experience complications related to bowel motility Outcome: Not Progressing   Problem: Pain Managment: Goal: General experience of comfort will improve Outcome: Not Progressing   Problem: Safety: Goal: Ability to remain free from injury will improve Outcome: Not Progressing   Problem: Skin Integrity: Goal: Risk for impaired skin integrity will decrease Outcome: Not Progressing   Problem: Education: Goal: Knowledge of warning signs, risks, and behaviors that relate to suicide ideation and self-harm behaviors will  improve Outcome: Not Progressing   Problem: Health Behavior/Discharge (Transition) Planning: Goal: Ability to manage health-related needs will improve Outcome: Not Progressing   Problem: Clinical Measurements: Goal: Remain free from any harm during hospitalization Outcome: Not Progressing   Problem: Nutrition: Goal: Adequate fluids and nutrition will be maintained Outcome: Not Progressing   Problem: Coping: Goal: Ability to disclose and discuss thoughts of suicide and self-harm will improve Outcome: Not Progressing   Problem: Self Esteem: Goal: Ability to verbalize positive feeling about self will improve Outcome: Not Progressing   Problem: Safety: Goal: Non-violent Restraint(s) Outcome: Not Progressing

## 2023-02-25 NOTE — Progress Notes (Signed)
Pt highly agitated at this time, yelling that he wants his wallet and his phone. This RN emptied pt belongs bag in front of pt to show him that his only belongs in the hospital include his work boots and his cut clothing. (This RN did not allow the pt to have any of his belongs due to his current IVC orders. Belongings bags are in cabinet in corner of room.) Pt still screaming for cell phone and wallet. This RN explained that those belongings are not here, but pt became increasingly agitated. Pt trying to escape bed. This RN repositioned pt and will continue to monitor for pt safety.

## 2023-02-25 NOTE — Consult Note (Signed)
Patient unable to participate in full psychiatric evaluation due to being lethargic, confused and disoriented at this time. Plan:Will see patient tomorrow for psych evaluation  Thedore Mins, MD

## 2023-02-26 MED ORDER — QUETIAPINE FUMARATE 50 MG PO TABS
50.0000 mg | ORAL_TABLET | Freq: Two times a day (BID) | ORAL | Status: DC
  Administered 2023-02-26 – 2023-03-03 (×10): 50 mg via ORAL
  Filled 2023-02-26 (×8): qty 1
  Filled 2023-02-26: qty 2
  Filled 2023-02-26: qty 1
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
## 2023-02-26 NOTE — Plan of Care (Signed)
  Problem: Clinical Measurements: Goal: Diagnostic test results will improve Outcome: Progressing Goal: Respiratory complications will improve Outcome: Progressing Goal: Cardiovascular complication will be avoided Outcome: Progressing   Problem: Activity: Goal: Risk for activity intolerance will decrease Outcome: Progressing   Problem: Coping: Goal: Level of anxiety will decrease Outcome: Progressing   Problem: Elimination: Goal: Will not experience complications related to urinary retention Outcome: Progressing   Problem: Pain Managment: Goal: General experience of comfort will improve Outcome: Progressing   Problem: Safety: Goal: Ability to remain free from injury will improve Outcome: Progressing   Problem: Skin Integrity: Goal: Risk for impaired skin integrity will decrease Outcome: Progressing   Problem: Clinical Measurements: Goal: Remain free from any harm during hospitalization Outcome: Progressing   Problem: Medication Management: Goal: Adhere to prescribed medication regimen Outcome: Progressing   Problem: Sleep Hygiene: Goal: Ability to obtain adequate restful sleep will improve Outcome: Progressing   Problem: Safety: Goal: Non-violent Restraint(s) Outcome: Progressing   Problem: Education: Goal: Knowledge of General Education information will improve Description: Including pain rating scale, medication(s)/side effects and non-pharmacologic comfort measures Outcome: Not Progressing   Problem: Health Behavior/Discharge Planning: Goal: Ability to manage health-related needs will improve Outcome: Not Progressing   Problem: Clinical Measurements: Goal: Ability to maintain clinical measurements within normal limits will improve Outcome: Not Progressing Goal: Will remain free from infection Outcome: Not Progressing   Problem: Nutrition: Goal: Adequate nutrition will be maintained Outcome: Not Progressing   Problem: Elimination: Goal: Will not  experience complications related to bowel motility Outcome: Not Progressing   Problem: Education: Goal: Knowledge of warning signs, risks, and behaviors that relate to suicide ideation and self-harm behaviors will improve Outcome: Not Progressing   Problem: Health Behavior/Discharge (Transition) Planning: Goal: Ability to manage health-related needs will improve Outcome: Not Progressing   Problem: Nutrition: Goal: Adequate fluids and nutrition will be maintained Outcome: Not Progressing   Problem: Coping: Goal: Ability to disclose and discuss thoughts of suicide and self-harm will improve Outcome: Not Progressing   Problem: Self Esteem: Goal: Ability to verbalize positive feeling about self will improve Outcome: Not Progressing

## 2023-02-26 NOTE — Consult Note (Signed)
Patient seen today, he is unable to participate in psychiatric evaluation due to being lethargic, somnolence, confused and disoriented. Plan: Psychiatry will continue to follow  Thedore Mins, MD Attending psychiatrist

## 2023-02-27 DIAGNOSIS — T148XXA Other injury of unspecified body region, initial encounter: Secondary | ICD-10-CM

## 2023-02-27 LAB — BASIC METABOLIC PANEL
Anion gap: 6 (ref 5–15)
BUN: 10 mg/dL (ref 8–23)
CO2: 20 mmol/L — ABNORMAL LOW (ref 22–32)
Calcium: 7.7 mg/dL — ABNORMAL LOW (ref 8.9–10.3)
Chloride: 108 mmol/L (ref 98–111)
Creatinine, Ser: 0.87 mg/dL (ref 0.61–1.24)
GFR, Estimated: >60 mL/min (ref >60)
Glucose, Bld: 92 mg/dL (ref 70–99)
Potassium: 3.2 mmol/L — ABNORMAL LOW (ref 3.5–5.1)
Sodium: 134 mmol/L — ABNORMAL LOW (ref 135–145)

## 2023-02-27 LAB — CBC
HCT: 31.3 % — ABNORMAL LOW (ref 39.0–52.0)
Hemoglobin: 9.8 g/dL — ABNORMAL LOW (ref 13.0–17.0)
MCH: 25.1 pg — ABNORMAL LOW (ref 26.0–34.0)
MCHC: 31.3 g/dL (ref 30.0–36.0)
MCV: 80.3 fL (ref 80.0–100.0)
Platelets: 64 10*3/uL — ABNORMAL LOW (ref 150–400)
RBC: 3.9 MIL/uL — ABNORMAL LOW (ref 4.22–5.81)
RDW: 20.4 % — ABNORMAL HIGH (ref 11.5–15.5)
WBC: 6.7 10*3/uL (ref 4.0–10.5)
nRBC: 0 % (ref 0.0–0.2)

## 2023-02-27 LAB — MAGNESIUM: Magnesium: 1.6 mg/dL — ABNORMAL LOW (ref 1.7–2.4)

## 2023-02-27 MED ORDER — THIAMINE HCL 100 MG/ML IJ SOLN
500.0000 mg | INTRAVENOUS | Status: AC
  Administered 2023-02-27 – 2023-03-01 (×3): 500 mg via INTRAVENOUS
  Filled 2023-02-27 (×3): qty 5
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
## 2023-02-27 NOTE — Care Management Important Message (Signed)
Important Message  Patient Details  Name: [name] MRN: [id number] Date of Birth: 1956-07-24   Medicare Important Message Given:  Yes     Sherilyn Banker 02/27/2023, 12:25 PM
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
## 2023-02-28 DIAGNOSIS — T148XXA Other injury of unspecified body region, initial encounter: Secondary | ICD-10-CM | POA: Diagnosis not present

## 2023-02-28 MED ORDER — PHENOBARBITAL SODIUM 130 MG/ML IJ SOLN
97.5000 mg | Freq: Three times a day (TID) | INTRAMUSCULAR | Status: AC
  Administered 2023-02-28 – 2023-03-02 (×6): 97.5 mg via INTRAVENOUS
  Filled 2023-02-28 (×6): qty 1

## 2023-02-28 MED ORDER — PHENOBARBITAL SODIUM 65 MG/ML IJ SOLN: 65.0000 mg | Freq: Three times a day (TID) | INTRAMUSCULAR | Status: DC

## 2023-02-28 MED ORDER — PHENOBARBITAL SODIUM 65 MG/ML IJ SOLN
32.5000 mg | Freq: Three times a day (TID) | INTRAMUSCULAR | Status: AC
  Administered 2023-03-04 – 2023-03-06 (×5): 32.5 mg via INTRAVENOUS
  Filled 2023-02-28 (×6): qty 1

## 2023-02-28 MED ORDER — PHENOBARBITAL SODIUM 65 MG/ML IJ SOLN: 32.5000 mg | Freq: Three times a day (TID) | INTRAMUSCULAR | Status: DC

## 2023-02-28 MED ORDER — SODIUM CHLORIDE 0.9 % IV SOLN
260.0000 mg | Freq: Once | INTRAVENOUS | Status: AC
  Administered 2023-02-28: 260 mg via INTRAVENOUS
  Filled 2023-02-28: qty 2

## 2023-02-28 MED ORDER — PHENOBARBITAL SODIUM 65 MG/ML IJ SOLN
65.0000 mg | Freq: Three times a day (TID) | INTRAMUSCULAR | Status: AC
  Administered 2023-03-02 – 2023-03-04 (×6): 65 mg via INTRAVENOUS
  Filled 2023-02-28 (×6): qty 1

## 2023-02-28 MED ORDER — PHENOBARBITAL SODIUM 130 MG/ML IJ SOLN: 97.5000 mg | Freq: Three times a day (TID) | INTRAMUSCULAR | Status: DC

## 2023-02-28 NOTE — Progress Notes (Signed)
PT Cancellation Note  Patient Details Name: [name] MRN: [id number] DOB: 1956/07/23   Cancelled Treatment:    Reason Eval/Treat Not Completed: Patient not medically ready  Patient remains in restraints. Offered to help him sit up and maybe stand up and he yells "I'm not doing anything except lying down. I've been going everywhere all day and I'm not getting up!!" Will continue PT attempts with hopes that agitation will subside and allow participation.    Jerolyn Center, PT Acute Rehabilitation Services  Office [phone]  Zena Amos 02/28/2023, 12:00 PM
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
## 2023-02-28 NOTE — Progress Notes (Signed)
Patient trying to get of bed and remove restraints. Patient speech slurred but able to hear patient requesting alcohol. Patient tried to spit out medication and refused to open his mouth for mouth care.  Administered Ativan to relieve agitation.

## 2023-02-28 NOTE — Progress Notes (Signed)
Patient lethargic throughout the night. Patient became agitated, trying to get out of the bed and remove restraints around 2303. Ativan administered and patient was able to relax and sleep.

## 2023-03-01 ENCOUNTER — Other Ambulatory Visit: Payer: Self-pay

## 2023-03-01 DIAGNOSIS — T148XXA Other injury of unspecified body region, initial encounter: Secondary | ICD-10-CM | POA: Diagnosis not present

## 2023-03-01 LAB — COMPREHENSIVE METABOLIC PANEL
ALT: 26 U/L (ref 0–44)
AST: 38 U/L (ref 15–41)
Albumin: 2.2 g/dL — ABNORMAL LOW (ref 3.5–5.0)
Alkaline Phosphatase: 43 U/L (ref 38–126)
Anion gap: 9 (ref 5–15)
BUN: 8 mg/dL (ref 8–23)
CO2: 18 mmol/L — ABNORMAL LOW (ref 22–32)
Calcium: 7.7 mg/dL — ABNORMAL LOW (ref 8.9–10.3)
Chloride: 108 mmol/L (ref 98–111)
Creatinine, Ser: 0.77 mg/dL (ref 0.61–1.24)
GFR, Estimated: >60 mL/min (ref >60)
Glucose, Bld: 89 mg/dL (ref 70–99)
Potassium: 3 mmol/L — ABNORMAL LOW (ref 3.5–5.1)
Sodium: 135 mmol/L (ref 135–145)
Total Bilirubin: 1.7 mg/dL — ABNORMAL HIGH (ref 0.3–1.2)
Total Protein: 5.8 g/dL — ABNORMAL LOW (ref 6.5–8.1)

## 2023-03-01 LAB — CBC
HCT: 30.6 % — ABNORMAL LOW (ref 39.0–52.0)
Hemoglobin: 9.6 g/dL — ABNORMAL LOW (ref 13.0–17.0)
MCH: 24.7 pg — ABNORMAL LOW (ref 26.0–34.0)
MCHC: 31.4 g/dL (ref 30.0–36.0)
MCV: 78.9 fL — ABNORMAL LOW (ref 80.0–100.0)
Platelets: 93 10*3/uL — ABNORMAL LOW (ref 150–400)
RBC: 3.88 MIL/uL — ABNORMAL LOW (ref 4.22–5.81)
RDW: 19.3 % — ABNORMAL HIGH (ref 11.5–15.5)
WBC: 4.6 10*3/uL (ref 4.0–10.5)
nRBC: 0 % (ref 0.0–0.2)

## 2023-03-01 LAB — GLUCOSE, CAPILLARY
Glucose-Capillary: 103 mg/dL — ABNORMAL HIGH (ref 70–99)
Glucose-Capillary: 130 mg/dL — ABNORMAL HIGH (ref 70–99)

## 2023-03-01 LAB — PHOSPHORUS: Phosphorus: 2.7 mg/dL (ref 2.5–4.6)

## 2023-03-01 LAB — MAGNESIUM: Magnesium: 1.7 mg/dL (ref 1.7–2.4)

## 2023-03-01 MED ORDER — CLONIDINE HCL 0.1 MG PO TABS
0.3000 mg | ORAL_TABLET | Freq: Two times a day (BID) | ORAL | Status: DC
  Administered 2023-03-01 – 2023-03-03 (×4): 0.3 mg via ORAL
  Filled 2023-03-01 (×4): qty 3

## 2023-03-01 MED ORDER — HALOPERIDOL LACTATE 5 MG/ML IJ SOLN
5.0000 mg | Freq: Four times a day (QID) | INTRAMUSCULAR | Status: DC | PRN
  Administered 2023-03-03 – 2023-03-20 (×9): 5 mg via INTRAVENOUS
  Filled 2023-03-01 (×9): qty 1

## 2023-03-01 MED ORDER — POTASSIUM CHLORIDE 10 MEQ/100ML IV SOLN
10.0000 meq | INTRAVENOUS | Status: AC
  Administered 2023-03-01 (×6): 10 meq via INTRAVENOUS
  Filled 2023-03-01 (×6): qty 100

## 2023-03-01 MED ORDER — ADULT MULTIVITAMIN W/MINERALS CH: 1.0000 | ORAL_TABLET | Freq: Every day | ORAL | Status: DC

## 2023-03-01 MED ORDER — MELATONIN 5 MG PO TABS
10.0000 mg | ORAL_TABLET | Freq: Every evening | ORAL | Status: DC | PRN
  Administered 2023-03-04 – 2023-04-02 (×21): 10 mg via ORAL
  Filled 2023-03-01 (×24): qty 2

## 2023-03-01 MED ORDER — SODIUM CHLORIDE 0.9 % IV SOLN: INTRAVENOUS | Status: AC

## 2023-03-01 MED ORDER — MAGNESIUM SULFATE 2 GM/50ML IV SOLN
2.0000 g | Freq: Once | INTRAVENOUS | Status: AC
  Administered 2023-03-01: 2 g via INTRAVENOUS
  Filled 2023-03-01: qty 50

## 2023-03-01 MED ORDER — LOPERAMIDE HCL 2 MG PO CAPS
2.0000 mg | ORAL_CAPSULE | ORAL | Status: AC | PRN
  Administered 2023-03-01: 4 mg via ORAL
  Filled 2023-03-01: qty 2

## 2023-03-01 MED ORDER — HYDROXYZINE HCL 25 MG PO TABS: 25.0000 mg | ORAL_TABLET | Freq: Four times a day (QID) | ORAL | Status: AC | PRN

## 2023-03-01 MED ORDER — RAMELTEON 8 MG PO TABS
8.0000 mg | ORAL_TABLET | Freq: Every day | ORAL | Status: DC
  Administered 2023-03-01 – 2023-03-30 (×30): 8 mg via ORAL
  Filled 2023-03-01 (×30): qty 1

## 2023-03-01 MED ORDER — MELATONIN 5 MG PO TABS: 10.0000 mg | ORAL_TABLET | Freq: Every day | ORAL | Status: DC

## 2023-03-01 MED ORDER — DOCUSATE SODIUM 50 MG/5ML PO LIQD
100.0000 mg | Freq: Two times a day (BID) | ORAL | Status: DC | PRN
  Administered 2023-03-14 – 2023-03-31 (×6): 100 mg via ORAL
  Filled 2023-03-01 (×6): qty 10

## 2023-03-01 MED ORDER — INSULIN ASPART 100 UNIT/ML IJ SOLN
0.0000 [IU] | Freq: Four times a day (QID) | INTRAMUSCULAR | Status: DC
  Administered 2023-03-01 – 2023-03-02 (×3): 2 [IU] via SUBCUTANEOUS
  Administered 2023-03-02: 3 [IU] via SUBCUTANEOUS
  Administered 2023-03-02 – 2023-03-03 (×3): 2 [IU] via SUBCUTANEOUS
  Administered 2023-03-03 – 2023-03-04 (×2): 3 [IU] via SUBCUTANEOUS
  Administered 2023-03-04 – 2023-03-06 (×4): 2 [IU] via SUBCUTANEOUS

## 2023-03-01 MED ORDER — SODIUM CHLORIDE 0.9% FLUSH
10.0000 mL | Freq: Two times a day (BID) | INTRAVENOUS | Status: DC
  Administered 2023-03-01 – 2023-03-02 (×2): 10 mL
  Administered 2023-03-02: 20 mL
  Administered 2023-03-03 – 2023-03-17 (×25): 10 mL
  Administered 2023-03-17: 40 mL
  Administered 2023-03-18: 10 mL
  Administered 2023-03-18: 40 mL
  Administered 2023-03-19: 10 mL
  Administered 2023-03-19: 40 mL
  Administered 2023-03-20: 10 mL
  Administered 2023-03-20: 40 mL
  Administered 2023-03-21 – 2023-04-01 (×23): 10 mL

## 2023-03-01 MED ORDER — SODIUM CHLORIDE 0.9% FLUSH: 10.0000 mL | INTRAVENOUS | Status: DC | PRN

## 2023-03-01 MED ORDER — TRAVASOL 10 % IV SOLN
INTRAVENOUS | Status: AC
  Filled 2023-03-01: qty 499.2

## 2023-03-01 MED ORDER — LORAZEPAM 1 MG PO TABS
1.0000 mg | ORAL_TABLET | Freq: Four times a day (QID) | ORAL | Status: AC | PRN
  Administered 2023-03-03: 1 mg via ORAL
  Filled 2023-03-01: qty 1

## 2023-03-01 MED ORDER — POLYETHYLENE GLYCOL 3350 17 G PO PACK
17.0000 g | PACK | Freq: Every day | ORAL | Status: DC | PRN
  Administered 2023-03-07 – 2023-03-25 (×8): 17 g
  Filled 2023-03-01 (×8): qty 1
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
## 2023-03-01 NOTE — Progress Notes (Signed)
Patient verbally and physically aggressive to nursing staff while performing patient care. Ativan administered to help with agitation.
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
## 2023-03-01 NOTE — Progress Notes (Signed)
Peripherally Inserted Central Catheter Placement  The IV Nurse has discussed with the patient and/or persons authorized to consent for the patient, the purpose of this procedure and the potential benefits and risks involved with this procedure.  The benefits include less needle sticks, lab draws from the catheter, and the patient may be discharged home with the catheter. Risks include, but not limited to, infection, bleeding, blood clot (thrombus formation), and puncture of an artery; nerve damage and irregular heartbeat and possibility to perform a PICC exchange if needed/ordered by physician.  Alternatives to this procedure were also discussed.  Bard Power PICC patient education guide, fact sheet on infection prevention and patient information card has been provided to patient /or left at bedside.    Medical necessity to place PICC provided by Trixie Deis, P.A.  PICC Placement Documentation  PICC Double Lumen 03/01/23 Right Brachial 36 cm 1 cm (Active)  Indication for Insertion or Continuance of Line Administration of hyperosmolar/irritating solutions (i.e. TPN, Vancomycin, etc.) 03/01/23 1749  Exposed Catheter (cm) 1 cm 03/01/23 1749  Site Assessment Clean, Dry, Intact 03/01/23 1749  Lumen #1 Status Flushed;Saline locked;Blood return noted 03/01/23 1749  Lumen #2 Status Flushed;Saline locked;Blood return noted 03/01/23 1749  Dressing Type Transparent;Securing device 03/01/23 1749  Dressing Status Antimicrobial disc in place 03/01/23 1749  Safety Lock Not Applicable 03/01/23 1749  Line Care Connections checked and tightened 03/01/23 1749  Line Adjustment (NICU/IV Team Only) No 03/01/23 1749  Dressing Intervention New dressing 03/01/23 1749  Dressing Change Due 03/08/23 03/01/23 1749       Vernona Rieger  Braxon Suder 03/01/2023, 5:52 PM
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
## 2023-03-01 NOTE — Progress Notes (Signed)
PICC line placement is medically necessary for administration of TPN. 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   [name], [facility] Surgery 03/01/2023, 4:04 PM Please see Amion for pager number during day hours 7:00am-4:30pm

## 2023-03-02 DIAGNOSIS — T148XXA Other injury of unspecified body region, initial encounter: Secondary | ICD-10-CM | POA: Diagnosis not present

## 2023-03-02 DIAGNOSIS — E440 Moderate protein-calorie malnutrition: Secondary | ICD-10-CM | POA: Diagnosis present

## 2023-03-02 LAB — GLUCOSE, CAPILLARY
Glucose-Capillary: 138 mg/dL — ABNORMAL HIGH (ref 70–99)
Glucose-Capillary: 139 mg/dL — ABNORMAL HIGH (ref 70–99)
Glucose-Capillary: 140 mg/dL — ABNORMAL HIGH (ref 70–99)
Glucose-Capillary: 179 mg/dL — ABNORMAL HIGH (ref 70–99)

## 2023-03-02 LAB — COMPREHENSIVE METABOLIC PANEL
ALT: 22 U/L (ref 0–44)
AST: 32 U/L (ref 15–41)
Albumin: 2.2 g/dL — ABNORMAL LOW (ref 3.5–5.0)
Alkaline Phosphatase: 45 U/L (ref 38–126)
Anion gap: 10 (ref 5–15)
BUN: 7 mg/dL — ABNORMAL LOW (ref 8–23)
CO2: 19 mmol/L — ABNORMAL LOW (ref 22–32)
Calcium: 7.6 mg/dL — ABNORMAL LOW (ref 8.9–10.3)
Chloride: 105 mmol/L (ref 98–111)
Creatinine, Ser: 0.72 mg/dL (ref 0.61–1.24)
GFR, Estimated: >60 mL/min (ref >60)
Glucose, Bld: 129 mg/dL — ABNORMAL HIGH (ref 70–99)
Potassium: 3 mmol/L — ABNORMAL LOW (ref 3.5–5.1)
Sodium: 134 mmol/L — ABNORMAL LOW (ref 135–145)
Total Bilirubin: 0.9 mg/dL (ref 0.3–1.2)
Total Protein: 5.9 g/dL — ABNORMAL LOW (ref 6.5–8.1)

## 2023-03-02 LAB — MAGNESIUM: Magnesium: 1.8 mg/dL (ref 1.7–2.4)

## 2023-03-02 LAB — PHOSPHORUS: Phosphorus: 1.7 mg/dL — ABNORMAL LOW (ref 2.5–4.6)

## 2023-03-02 LAB — AMMONIA: Ammonia: 43 umol/L — ABNORMAL HIGH (ref 9–35)
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
## 2023-03-02 IMAGING — CT CT HEAD CODE STROKE
4 series · 16 of 47 positions shown, 18 images · non-contrast
Comparison: 11/09/2011

CLINICAL DATA: Code stroke.  Asymmetric pupils.  Confusion.

EXAM:
CT HEAD WITHOUT CONTRAST
TECHNIQUE: Contiguous axial images were obtained from the base of the skull
through the vertex without intravenous contrast.

[Series 2: head wo · axial · 0.46mm/px · z∈[+983,+1113]mm · 7 of 36 slices shown, 9 images]
[im 5/36  brain]
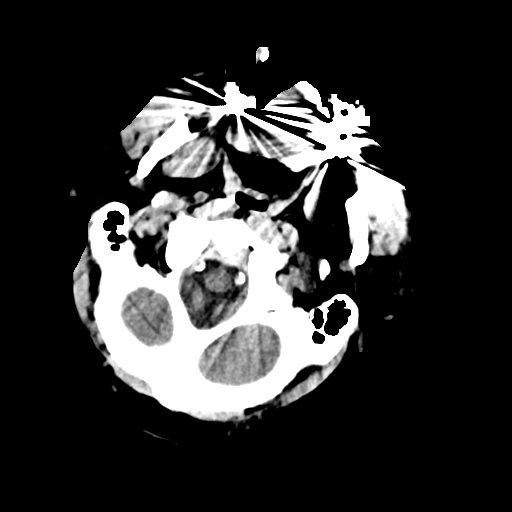
[im 5/36  bone]
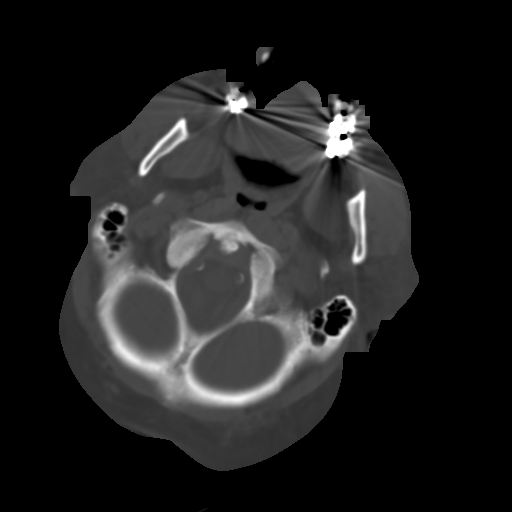
[im 9/36  brain]
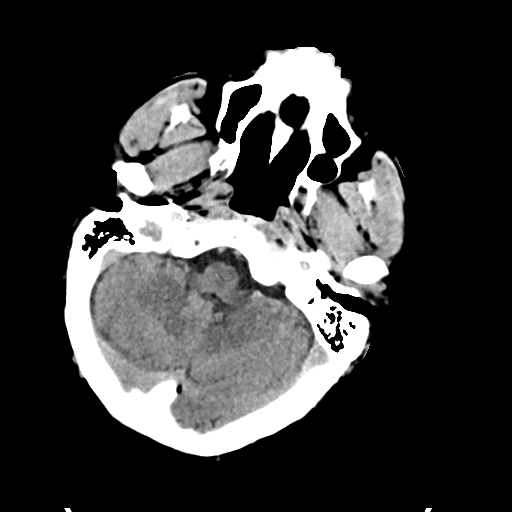
[im 14/36  brain]
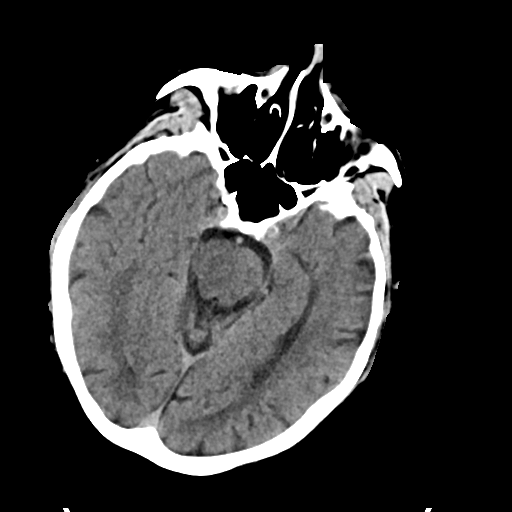
[im 18/36  brain]
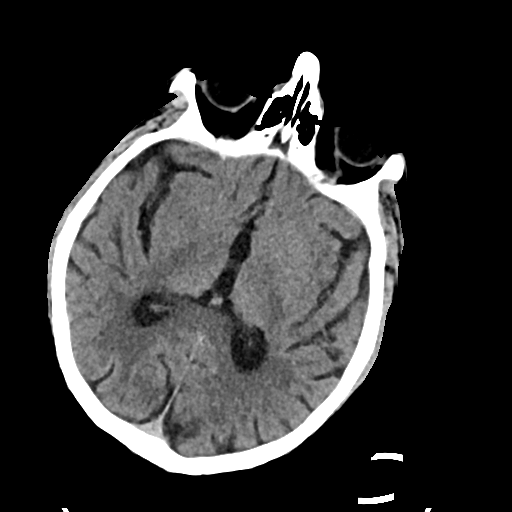
[im 22/36  brain]
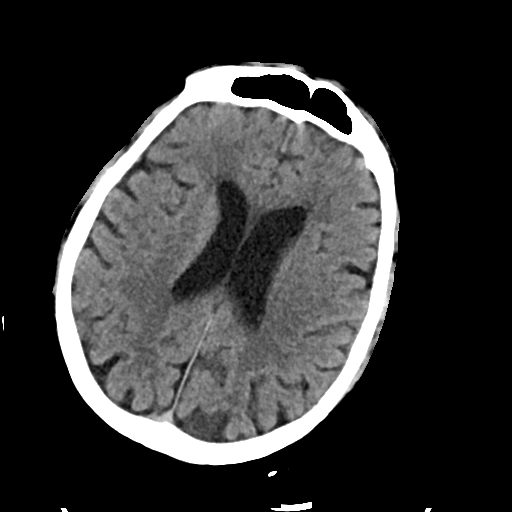
[im 22/36  bone]
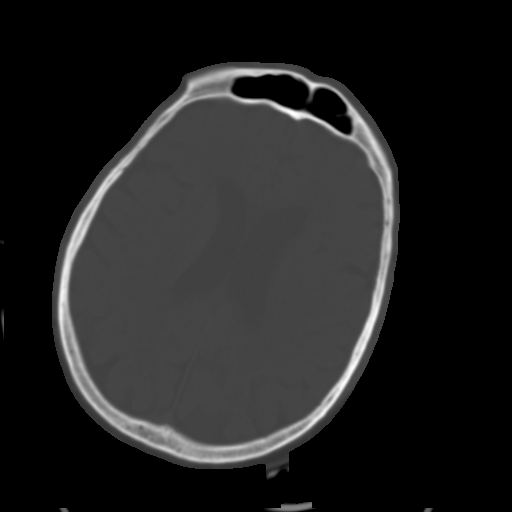
[im 27/36  brain]
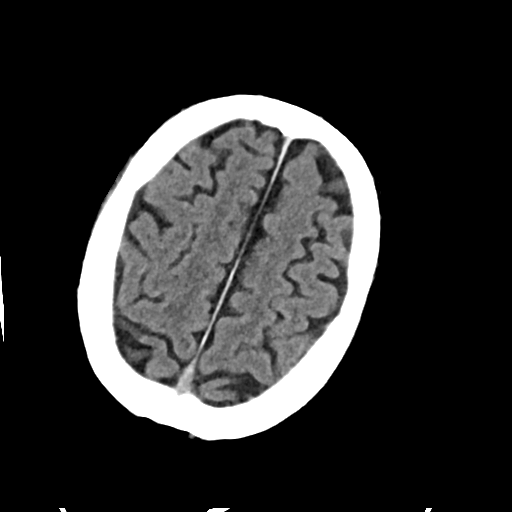
[im 31/36  brain]
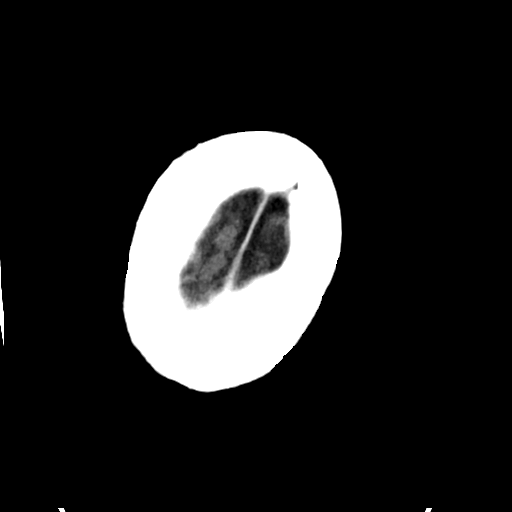

[Series 3: head bone · axial · 0.46mm/px · z∈[+979,+1015]mm · 3 of 89 slices shown]
[im 9/89  bone]
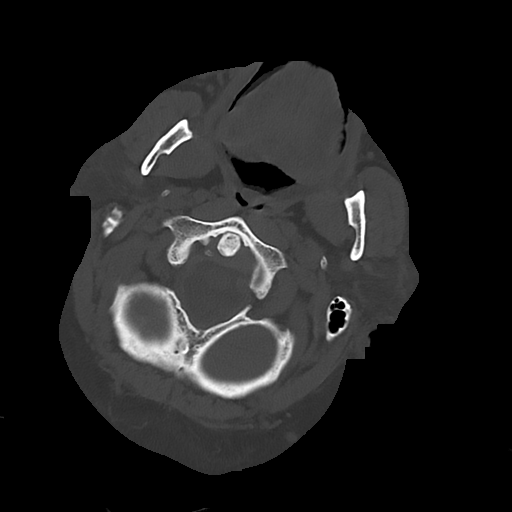
[im 18/89  bone]
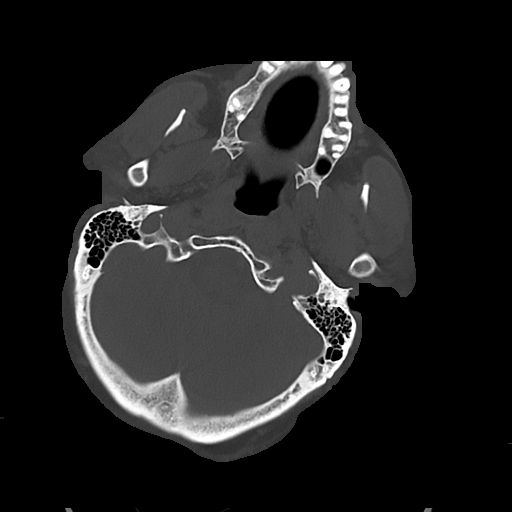
[im 27/89  bone]
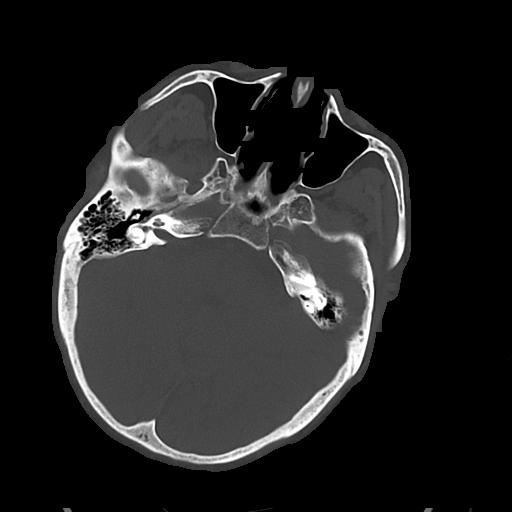

[Series 4: cor soft · coronal · 0.37mm/px · 3 of 78 slices shown]
[im 26/78  brain]
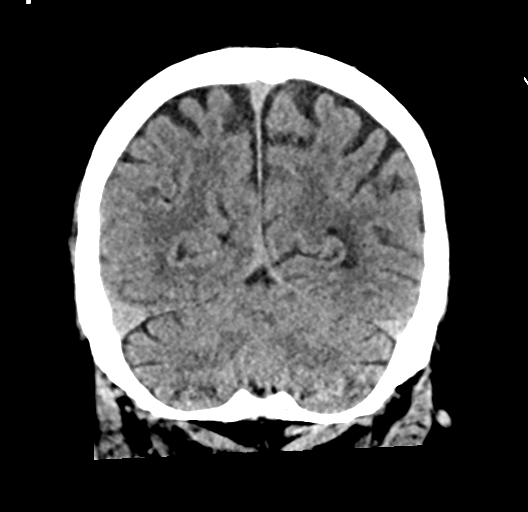
[im 35/78  brain]
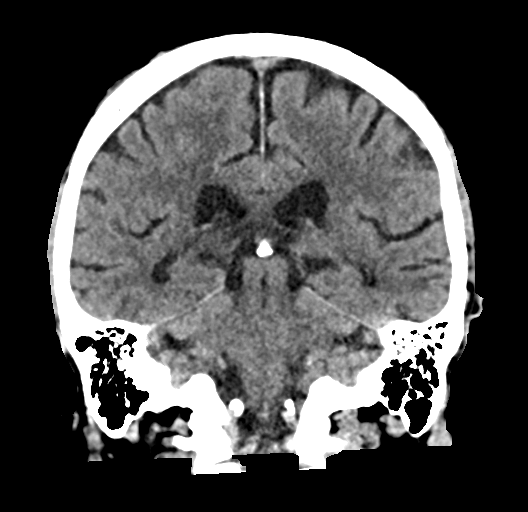
[im 43/78  brain]
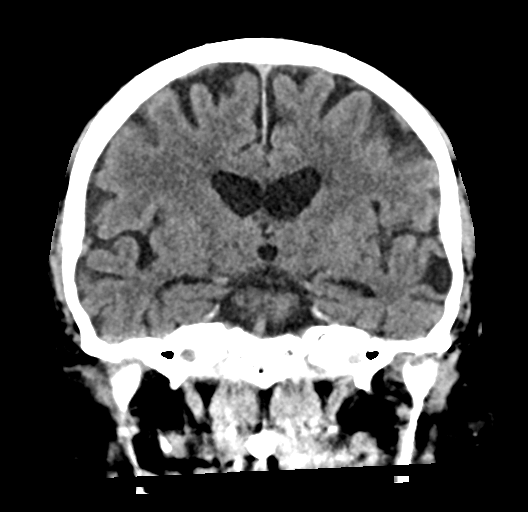

[Series 5: sag soft · sagittal · 0.37mm/px · 3 of 65 slices shown]
[im 22/65  brain]
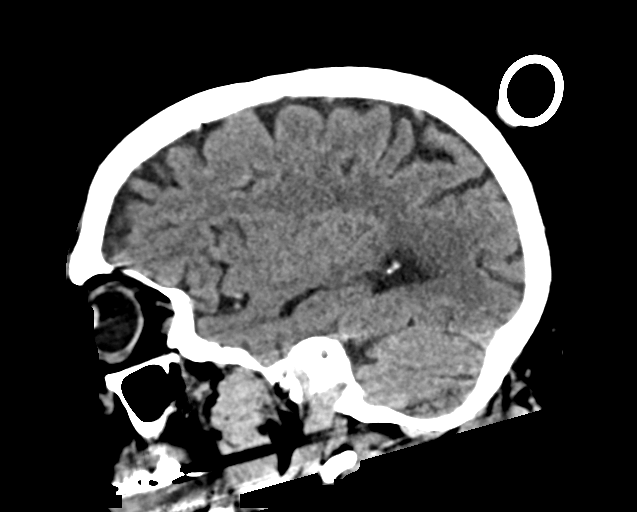
[im 33/65  brain]
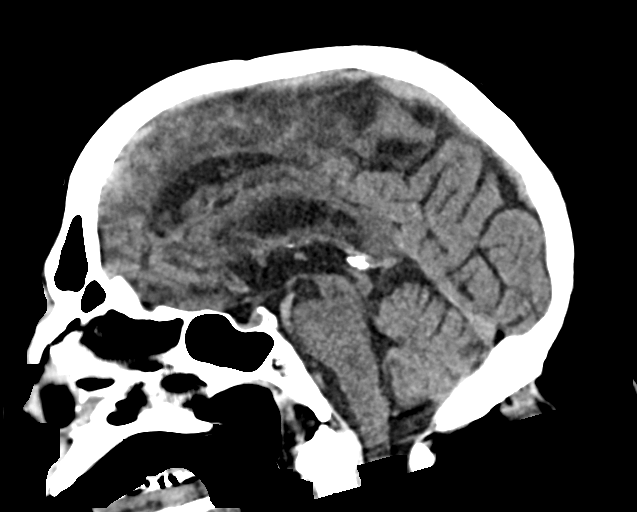
[im 43/65  brain]
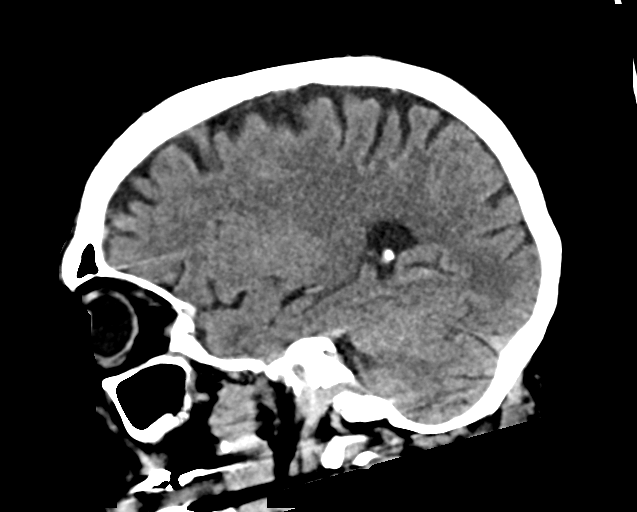

[16 of 47 positions shown; findings below may reference images not displayed]

FINDINGS: Brain: There is no evidence of an acute infarct, intracranial
hemorrhage, mass, midline shift, or extra-axial fluid collection. A
small region of encephalomalacia laterally in the left temporal lobe
is unchanged and may be post ischemic or traumatic. Hypodensities in
the cerebral white matter bilaterally are similar to the prior study
and are nonspecific but compatible with mild chronic small vessel
ischemic disease. There is mild cerebral atrophy.

Vascular: Calcified atherosclerosis at the skull base. No hyperdense
vessel.

Skull: No acute fracture or suspicious osseous lesion.

Sinuses/Orbits: Paranasal sinuses and mastoid air cells are clear.
Unremarkable orbits.

Other: None.

ASPECTS ([place] Stroke Program Early CT Score)

Not scored with this history.
IMPRESSION: 1. No evidence of acute intracranial abnormality.
2. Mild chronic small vessel ischemic disease.
3. Unchanged left temporal encephalomalacia.

These results were communicated to Dr. Daloulet Hobi at [DATE] on
02/07/2021 by text page via the AMION messaging system.

## 2023-03-02 IMAGING — MR MR HEAD W/O CM
11 series · 48 of 48 positions shown · non-contrast
Comparison: None.

CLINICAL DATA: Neuro deficit, acute, stroke suspected; Cerebral
aneurysm, follow-up

EXAM:
MRI HEAD WITHOUT CONTRAST
MRA HEAD WITHOUT CONTRAST
TECHNIQUE: Multiplanar, multiecho pulse sequences of the brain and surrounding
structures were obtained without intravenous contrast. Angiographic
images of the head were obtained using MRA technique without
contrast.

[Series 5: DWI · axial · 3.0mm · 0.88mm/px · z∈[-66,+78]mm · 10 of 100 slices shown (1 of 4)]
[im 1/100]
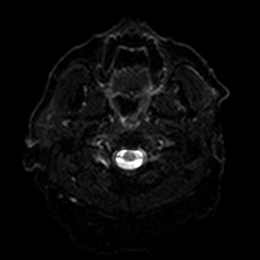
[im 12/100]
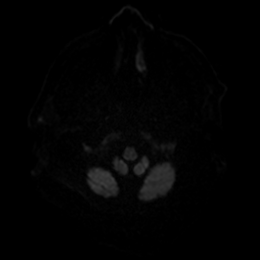
[im 23/100]
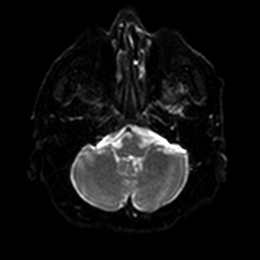
[im 34/100]
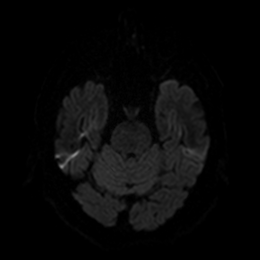
[im 45/100]
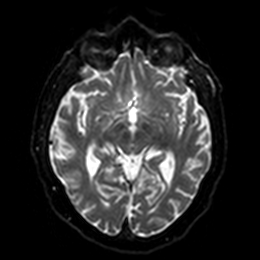
[im 56/100]
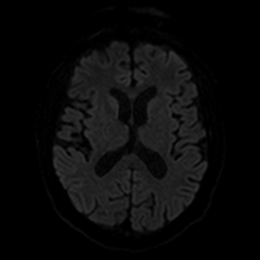
[im 67/100]
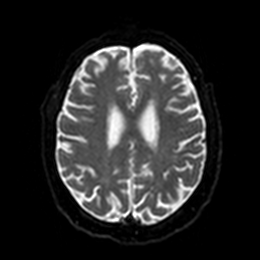
[im 78/100]
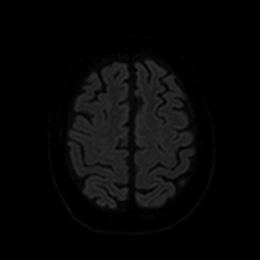
[im 89/100]
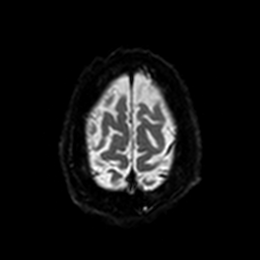
[im 100/100]
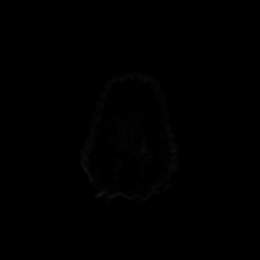

[Series 6: DWI · axial · 3.0mm · 0.88mm/px · z∈[-66,+78]mm · 5 of 50 slices shown (2 of 4)]
[im 1/50]
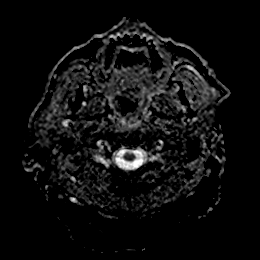
[im 13/50]
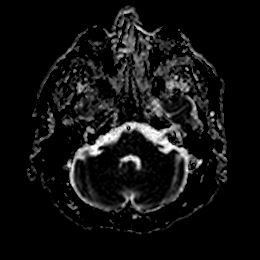
[im 25/50]
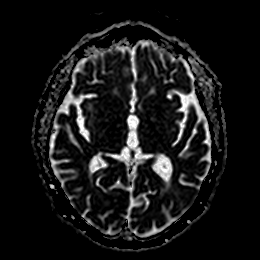
[im 37/50]
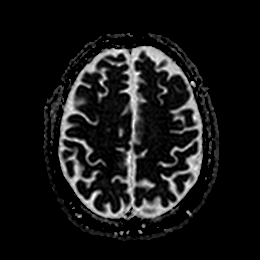
[im 50/50]
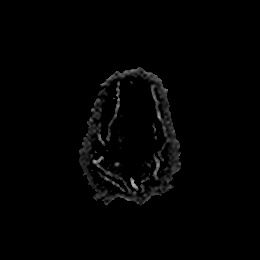

[Series 7: T1 · sagittal · 5.0mm · 0.75mm/px · 2 of 27 slices shown]
[im 1/27]
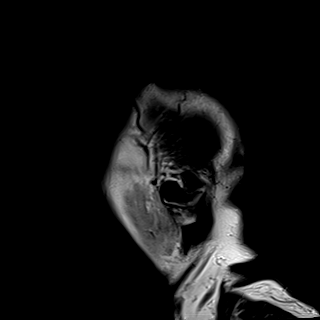
[im 27/27]
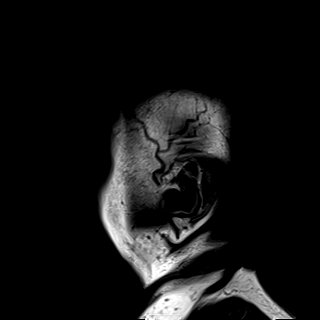

[Series 8: DWI · coronal · 4.0mm · 0.88mm/px · 6 of 70 slices shown (3 of 4)]
[im 1/70]
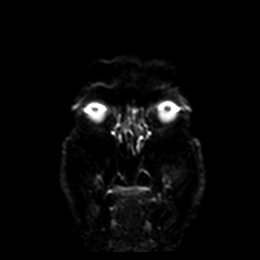
[im 14/70]
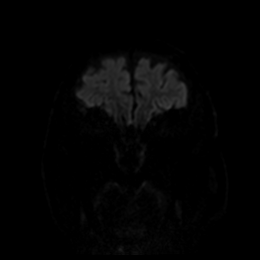
[im 28/70]
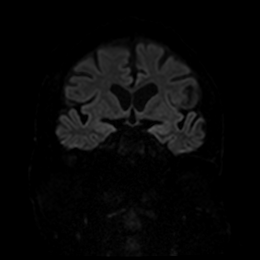
[im 42/70]
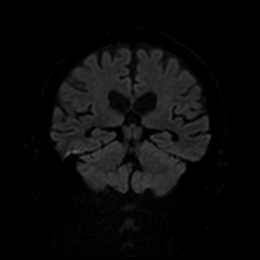
[im 56/70]
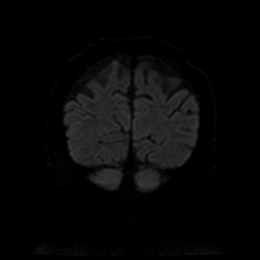
[im 70/70]
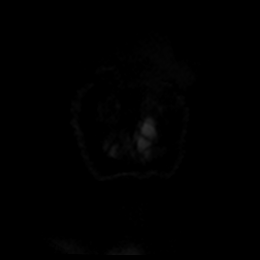

[Series 9: DWI · coronal · 4.0mm · 0.88mm/px · 3 of 35 slices shown (4 of 4)]
[im 1/35]
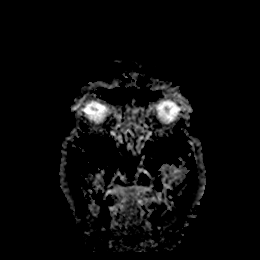
[im 18/35]
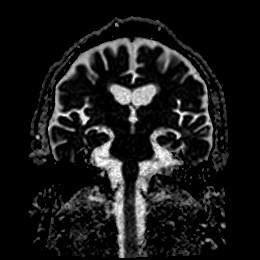
[im 35/35]
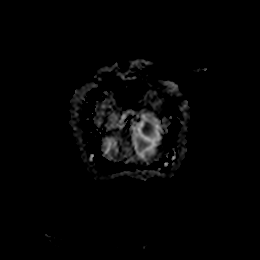

[Series 29: FLAIR · axial · 5.0mm · 0.90mm/px · z∈[-80,+75]mm · 2 of 27 slices shown]
[im 1/27]
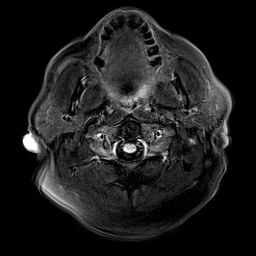
[im 27/27]
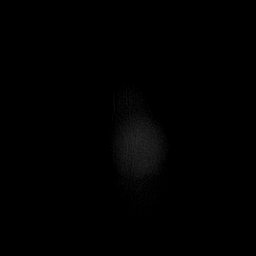

[Series 30: mag_images · axial · 3.0mm · 0.90mm/px · z∈[-84,+81]mm · 5 of 56 slices shown]
[im 1/56]
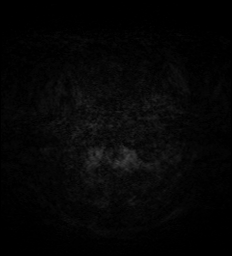
[im 14/56]
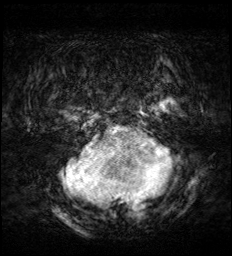
[im 28/56]
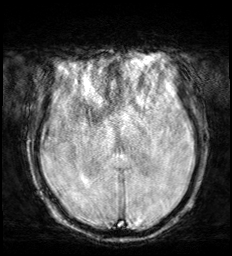
[im 42/56]
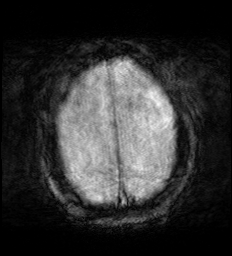
[im 56/56]
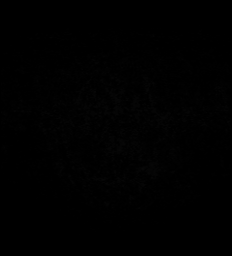

[Series 31: pha_images · axial · 3.0mm · 0.90mm/px · z∈[-81,+69]mm · 4 of 48 slices shown]
[im 1/48]
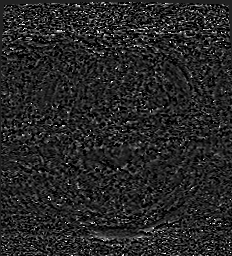
[im 16/48]
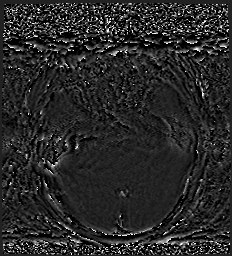
[im 32/48]
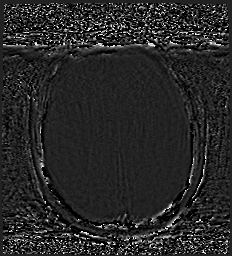
[im 48/48]
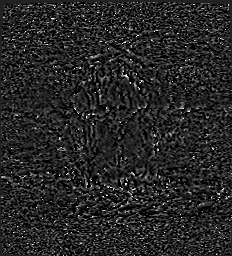

[Series 32: swi_images · axial · 3.0mm · 0.90mm/px · z∈[-84,+81]mm · 5 of 56 slices shown]
[im 1/56]
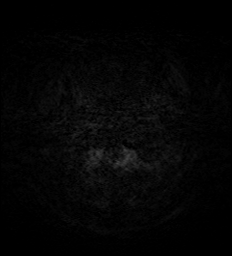
[im 14/56]
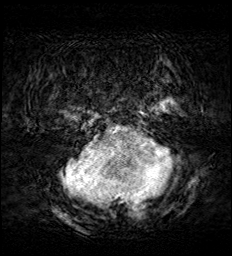
[im 28/56]
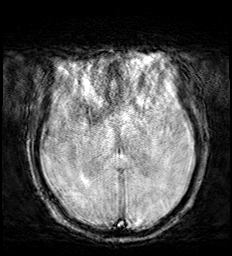
[im 42/56]
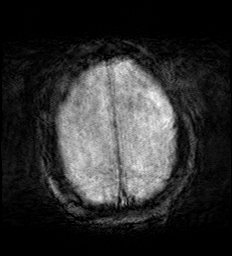
[im 56/56]
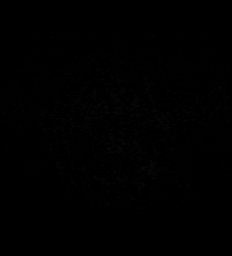

[Series 33: mip_images(sw) · axial · 24.0mm · 0.90mm/px · z∈[-74,+70]mm · 4 of 49 slices shown]
[im 1/49]
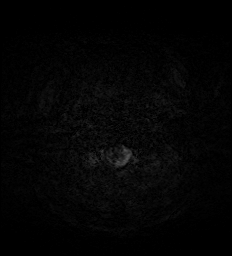
[im 17/49]
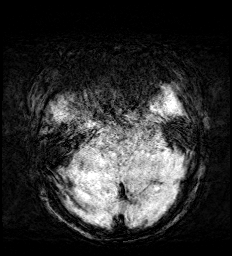
[im 33/49]
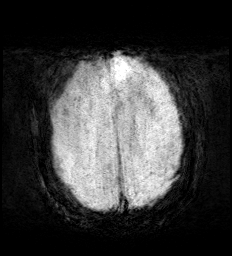
[im 49/49]
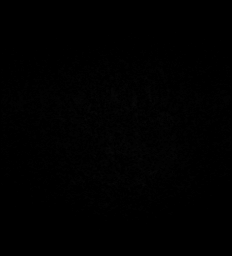

[Series 34: T2 · axial · 5.0mm · 0.72mm/px · z∈[-80,+75]mm · 2 of 27 slices shown]
[im 1/27]
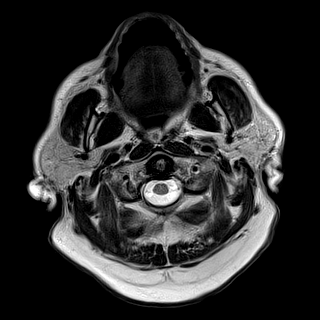
[im 27/27]
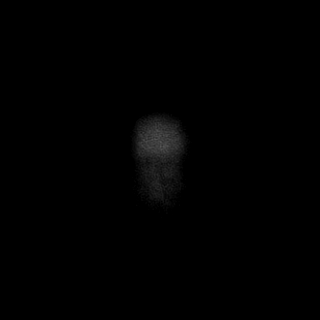

[48 of 48 positions shown; findings below may reference images not displayed]

FINDINGS: MRI HEAD FINDINGS

Mildly motion degraded examination.

Brain: No acute infarct, mass effect or extra-axial collection. No
acute or chronic hemorrhage. There is multifocal hyperintense
T2-weighted signal within the white matter. Parenchymal volume and
CSF spaces are normal. There is an old left temporal lobe infarct.
The midline structures are normal.

Vascular: Major flow voids are preserved.

Skull and upper cervical spine: Normal calvarium and skull base.
Visualized upper cervical spine and soft tissues are normal.

Sinuses/Orbits:No paranasal sinus fluid levels or advanced mucosal
thickening. No mastoid or middle ear effusion. Normal orbits.

MRA HEAD FINDINGS

Mildly motion degraded examination.

POSTERIOR CIRCULATION:

--Vertebral arteries: Normal

--Inferior cerebellar arteries: Normal.

--Basilar artery: Normal.

--Superior cerebellar arteries: Normal.

--Posterior cerebral arteries: Normal.

ANTERIOR CIRCULATION:

--Intracranial internal carotid arteries: Normal.

--Anterior cerebral arteries (ACA): Normal.

--Middle cerebral arteries (MCA): Normal.

ANATOMIC VARIANTS: None
IMPRESSION: 1. Motion degraded examination.
2. No acute intracranial abnormality.
3. Normal intracranial MRA.
4. Old left temporal lobe infarct and findings of chronic small
vessel disease.

## 2023-03-02 MED ORDER — LACTULOSE 10 GM/15ML PO SOLN
20.0000 g | Freq: Every day | ORAL | Status: DC
  Administered 2023-03-02 – 2023-04-03 (×31): 20 g via ORAL
  Filled 2023-03-02 (×34): qty 30

## 2023-03-02 MED ORDER — SODIUM CHLORIDE 0.9 % IV SOLN: INTRAVENOUS | Status: DC

## 2023-03-02 MED ORDER — POTASSIUM CHLORIDE 10 MEQ/50ML IV SOLN
10.0000 meq | INTRAVENOUS | Status: AC
  Administered 2023-03-02 (×2): 10 meq via INTRAVENOUS
  Filled 2023-03-02 (×2): qty 50

## 2023-03-02 MED ORDER — POTASSIUM PHOSPHATES 15 MMOLE/5ML IV SOLN
45.0000 mmol | Freq: Once | INTRAVENOUS | Status: AC
  Administered 2023-03-02: 45 mmol via INTRAVENOUS
  Filled 2023-03-02: qty 15

## 2023-03-02 MED ORDER — MAGNESIUM SULFATE 2 GM/50ML IV SOLN
2.0000 g | Freq: Once | INTRAVENOUS | Status: AC
  Administered 2023-03-02: 2 g via INTRAVENOUS
  Filled 2023-03-02: qty 50

## 2023-03-02 MED ORDER — TRAVASOL 10 % IV SOLN
INTRAVENOUS | Status: AC
  Filled 2023-03-02: qty 624

## 2023-03-02 NOTE — Progress Notes (Addendum)
Patient more cooperative and less agitated throughout the night. Still very confused. Patient's speech becoming clear. Still requesting alcohol. No ativan needed throughout the night.
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
## 2023-03-03 DIAGNOSIS — T148XXA Other injury of unspecified body region, initial encounter: Secondary | ICD-10-CM | POA: Diagnosis not present

## 2023-03-03 LAB — MAGNESIUM: Magnesium: 1.8 mg/dL (ref 1.7–2.4)

## 2023-03-03 LAB — BASIC METABOLIC PANEL
Anion gap: 8 (ref 5–15)
BUN: 6 mg/dL — ABNORMAL LOW (ref 8–23)
CO2: 21 mmol/L — ABNORMAL LOW (ref 22–32)
Calcium: 7.8 mg/dL — ABNORMAL LOW (ref 8.9–10.3)
Chloride: 106 mmol/L (ref 98–111)
Creatinine, Ser: 0.69 mg/dL (ref 0.61–1.24)
GFR, Estimated: >60 mL/min (ref >60)
Glucose, Bld: 151 mg/dL — ABNORMAL HIGH (ref 70–99)
Potassium: 3.1 mmol/L — ABNORMAL LOW (ref 3.5–5.1)
Sodium: 135 mmol/L (ref 135–145)

## 2023-03-03 LAB — GLUCOSE, CAPILLARY
Glucose-Capillary: 120 mg/dL — ABNORMAL HIGH (ref 70–99)
Glucose-Capillary: 158 mg/dL — ABNORMAL HIGH (ref 70–99)
Glucose-Capillary: 139 mg/dL — ABNORMAL HIGH (ref 70–99)
Glucose-Capillary: 141 mg/dL — ABNORMAL HIGH (ref 70–99)

## 2023-03-03 LAB — AMMONIA: Ammonia: 35 umol/L (ref 9–35)

## 2023-03-03 LAB — VITAMIN B1: Vitamin B1 (Thiamine): 70.1 nmol/L (ref 66.5–200.0)

## 2023-03-03 LAB — PHOSPHORUS: Phosphorus: 2.4 mg/dL — ABNORMAL LOW (ref 2.5–4.6)

## 2023-03-03 IMAGING — CR DG HIP (WITH OR WITHOUT PELVIS) 2-3V*R*
3 series · 3 of 3 positions shown · non-contrast
Comparison: None.

CLINICAL DATA: Right-sided hip pain.

EXAM:
DG HIP (WITH OR WITHOUT PELVIS) 2-3V RIGHT

[hip ap]
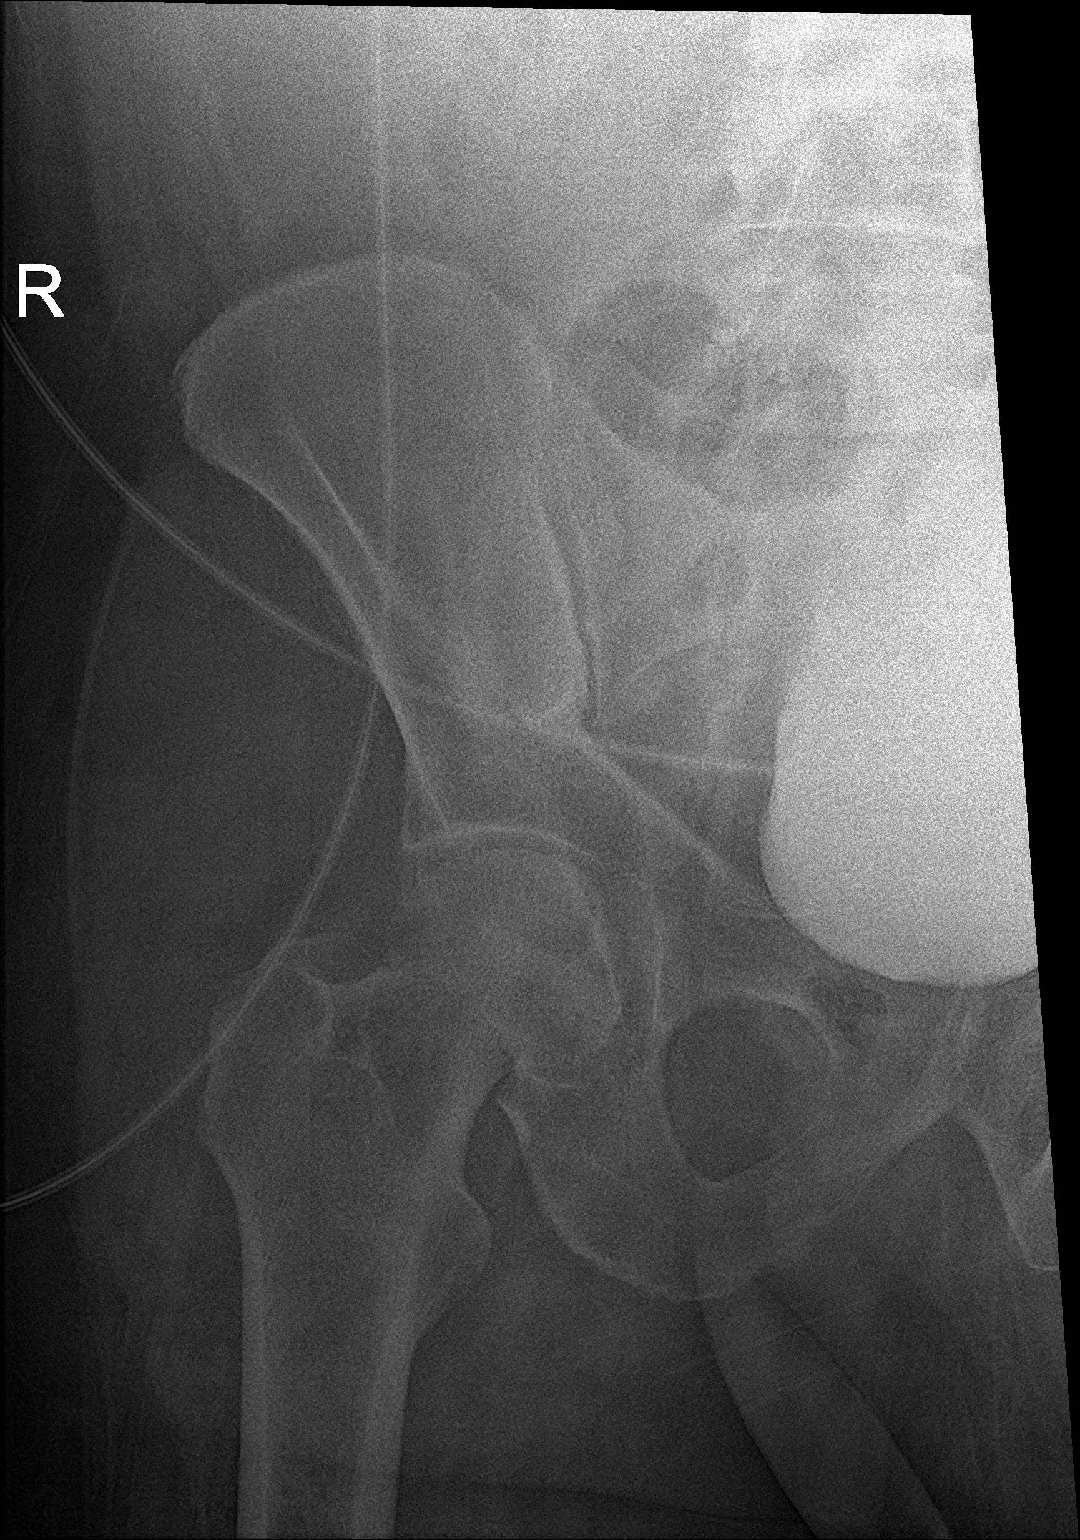

[hip lat]
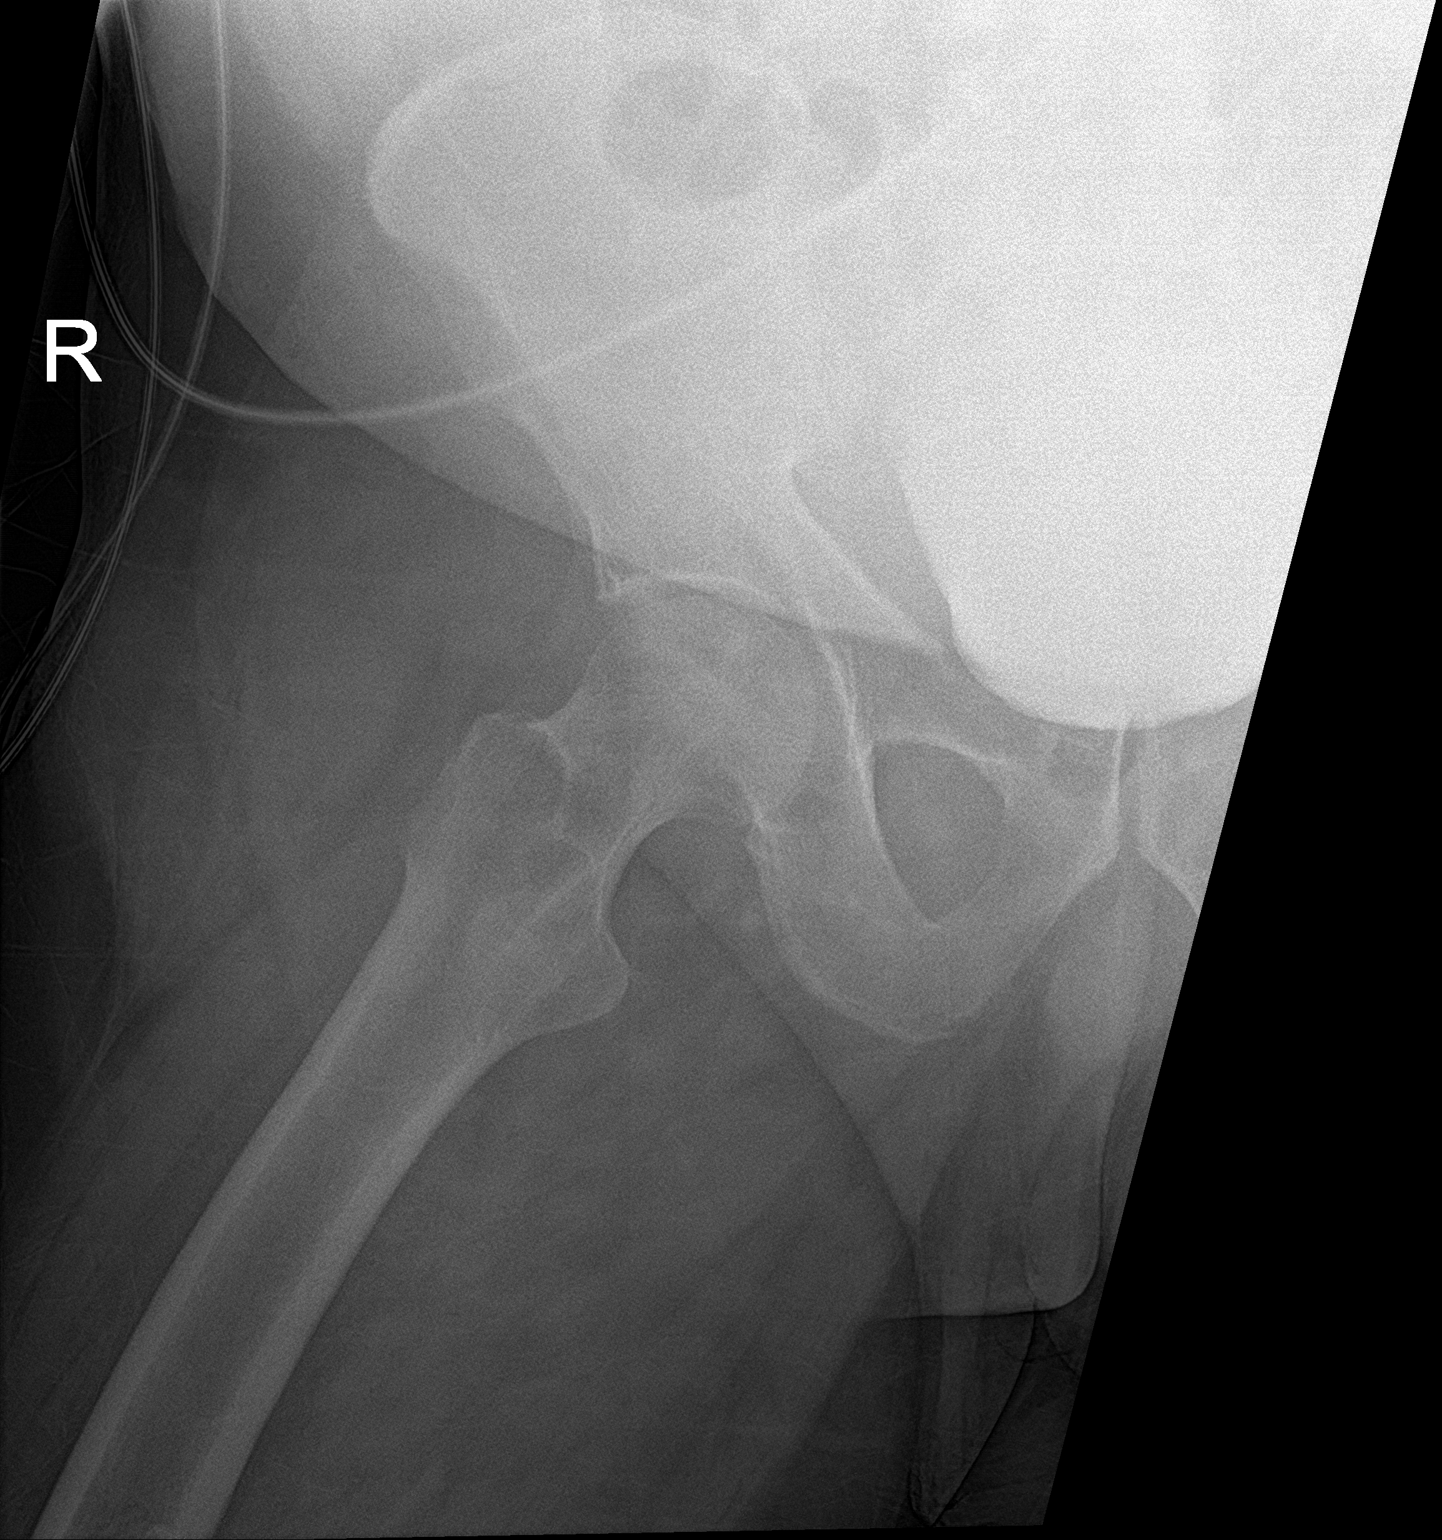

[pelvis ap]
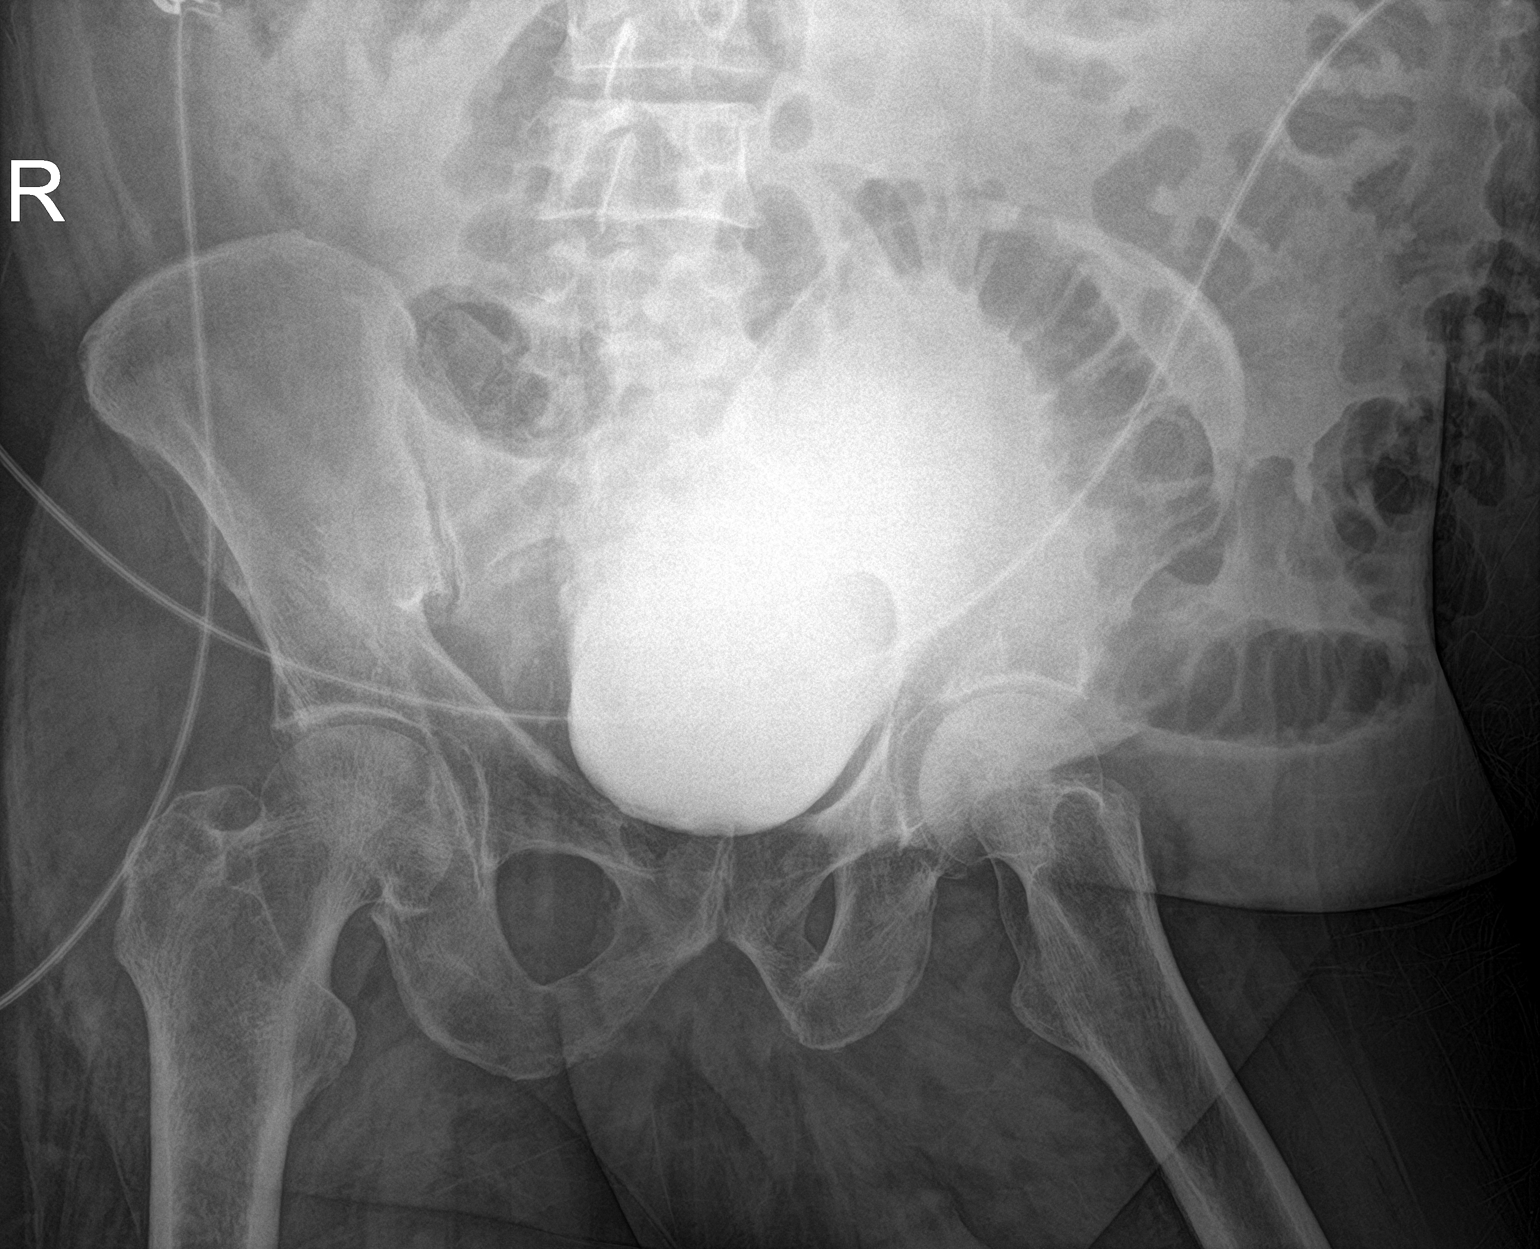

[3 of 3 positions shown; findings below may reference images not displayed]

FINDINGS: Degenerative changes in the right hip. No evidence of acute fracture
or dislocation. No focal bone lesion or bone destruction. Soft
tissues are unremarkable. Residual contrast material in the bladder.
IMPRESSION: Degenerative changes in the right hip.

## 2023-03-03 MED ORDER — POTASSIUM CHLORIDE 10 MEQ/50ML IV SOLN
10.0000 meq | INTRAVENOUS | Status: AC
  Administered 2023-03-03 (×5): 10 meq via INTRAVENOUS
  Filled 2023-03-03 (×5): qty 50

## 2023-03-03 MED ORDER — TRAVASOL 10 % IV SOLN
INTRAVENOUS | Status: AC
  Filled 2023-03-03: qty 998.4

## 2023-03-03 MED ORDER — CLONIDINE HCL 0.1 MG PO TABS
0.2000 mg | ORAL_TABLET | Freq: Two times a day (BID) | ORAL | Status: DC
  Administered 2023-03-03 – 2023-03-07 (×7): 0.2 mg via ORAL
  Filled 2023-03-03 (×8): qty 2

## 2023-03-03 MED ORDER — POTASSIUM PHOSPHATES 15 MMOLE/5ML IV SOLN
30.0000 mmol | Freq: Once | INTRAVENOUS | Status: AC
  Administered 2023-03-03: 30 mmol via INTRAVENOUS
  Filled 2023-03-03: qty 10

## 2023-03-03 MED ORDER — MAGNESIUM SULFATE 2 GM/50ML IV SOLN: 2.0000 g | Freq: Once | INTRAVENOUS | Status: DC

## 2023-03-03 MED ORDER — CALCIUM POLYCARBOPHIL 625 MG PO TABS
625.0000 mg | ORAL_TABLET | Freq: Two times a day (BID) | ORAL | Status: DC
  Administered 2023-03-03 – 2023-04-03 (×60): 625 mg via ORAL
  Filled 2023-03-03 (×65): qty 1

## 2023-03-03 MED ORDER — MAGNESIUM SULFATE 4 GM/100ML IV SOLN
4.0000 g | Freq: Once | INTRAVENOUS | Status: AC
  Administered 2023-03-03: 4 g via INTRAVENOUS
  Filled 2023-03-03: qty 100

## 2023-03-03 MED ORDER — QUETIAPINE FUMARATE ER 50 MG PO TB24
100.0000 mg | ORAL_TABLET | Freq: Every day | ORAL | Status: DC
  Administered 2023-03-03: 100 mg via ORAL
  Filled 2023-03-03: qty 2
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
## 2023-03-03 NOTE — Progress Notes (Signed)
Patient refused to take tylenol for temp.

## 2023-03-03 NOTE — Progress Notes (Signed)
Patient was very agitated and trying to get out of bed to leave.  He was disoriented to place and situation.  Gave ativan PO first and helped to settle him down for a short time but about an hour later he became much more agitated and verbally aggressive. Would not let us clean up his stool incontinence.  Gave Haldol per order and he settled down.  We were able to clean him up and continue additional patient care.

## 2023-03-03 NOTE — TOC Progression Note (Signed)
Transition of Care [facility]) - Progression Note    Patient Details  Name: Kholton Bartolotta MRN: [id number] Date of Birth: 03/15/1956  Transition of Care [facility]) CM/SW Contact  Erin Sons, [place] Phone Number: 03/03/2023, 3:58 PM  Clinical Narrative:     CSW submitted IVC renewal on efile website under original IVC case number. CSW is informed by magistrates office that CSW would need to resubmit renewal as a new filing and that IVC could only be requested for 7 days instead of 30.   CSW submitted new IVC request and received new case number: [id number]  CSW received Findings and Custody Order for Involuntary Commitment signed by magistrate. Copies made and put in pt's chart. CSW called law enforcement for service only of IVC. Unit secretary notified.    Expected Discharge Plan: [facility] Barriers to Discharge: Continued Medical Work up  Expected Discharge Plan and Services   Discharge Planning Services: CM Consult   Living arrangements for the past 2 months: Homeless                                       Social Determinants of Health (SDOH) Interventions SDOH Screenings   Tobacco Use: High Risk (02/24/2023)    Readmission Risk Interventions     No data to display
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
## 2023-03-03 NOTE — Progress Notes (Signed)
Patient again became aggressive and trying to clime out of bed.  Gave IV Ativan per order because PO ativan in AM only helped to calm patient for a short period of time.  This has been affective to this point.
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
## 2023-03-04 DIAGNOSIS — T148XXA Other injury of unspecified body region, initial encounter: Secondary | ICD-10-CM | POA: Diagnosis not present

## 2023-03-04 LAB — BASIC METABOLIC PANEL
Anion gap: 11 (ref 5–15)
BUN: 7 mg/dL — ABNORMAL LOW (ref 8–23)
CO2: 19 mmol/L — ABNORMAL LOW (ref 22–32)
Calcium: 7.5 mg/dL — ABNORMAL LOW (ref 8.9–10.3)
Chloride: 104 mmol/L (ref 98–111)
Creatinine, Ser: 0.78 mg/dL (ref 0.61–1.24)
GFR, Estimated: >60 mL/min (ref >60)
Glucose, Bld: 136 mg/dL — ABNORMAL HIGH (ref 70–99)
Potassium: 3.8 mmol/L (ref 3.5–5.1)
Sodium: 134 mmol/L — ABNORMAL LOW (ref 135–145)

## 2023-03-04 LAB — GLUCOSE, CAPILLARY
Glucose-Capillary: 145 mg/dL — ABNORMAL HIGH (ref 70–99)
Glucose-Capillary: 147 mg/dL — ABNORMAL HIGH (ref 70–99)
Glucose-Capillary: 117 mg/dL — ABNORMAL HIGH (ref 70–99)
Glucose-Capillary: 157 mg/dL — ABNORMAL HIGH (ref 70–99)

## 2023-03-04 LAB — PHOSPHORUS: Phosphorus: 2.5 mg/dL (ref 2.5–4.6)

## 2023-03-04 LAB — MAGNESIUM: Magnesium: 1.9 mg/dL (ref 1.7–2.4)

## 2023-03-04 LAB — AMMONIA: Ammonia: 35 umol/L (ref 9–35)

## 2023-03-04 MED ORDER — QUETIAPINE FUMARATE 100 MG PO TABS
100.0000 mg | ORAL_TABLET | Freq: Every day | ORAL | Status: DC
  Administered 2023-03-04 – 2023-03-10 (×7): 100 mg via ORAL
  Filled 2023-03-04 (×7): qty 1

## 2023-03-04 MED ORDER — TRAVASOL 10 % IV SOLN
INTRAVENOUS | Status: AC
  Filled 2023-03-04: qty 998.4

## 2023-03-04 MED ORDER — QUETIAPINE FUMARATE 50 MG PO TABS
50.0000 mg | ORAL_TABLET | Freq: Every day | ORAL | Status: DC
  Administered 2023-03-04 – 2023-03-07 (×3): 50 mg via ORAL
  Filled 2023-03-04 (×4): qty 1
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
## 2023-03-04 NOTE — Progress Notes (Signed)
Pt noted to have rash around drainage bag as well as down his side, both legs and back   Will pass onto dayshift RN
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
## 2023-03-05 DIAGNOSIS — T148XXA Other injury of unspecified body region, initial encounter: Secondary | ICD-10-CM | POA: Diagnosis not present

## 2023-03-05 LAB — BASIC METABOLIC PANEL
Anion gap: 6 (ref 5–15)
BUN: 10 mg/dL (ref 8–23)
CO2: 24 mmol/L (ref 22–32)
Calcium: 7.8 mg/dL — ABNORMAL LOW (ref 8.9–10.3)
Chloride: 103 mmol/L (ref 98–111)
Creatinine, Ser: 0.67 mg/dL (ref 0.61–1.24)
GFR, Estimated: >60 mL/min (ref >60)
Glucose, Bld: 130 mg/dL — ABNORMAL HIGH (ref 70–99)
Potassium: 3.8 mmol/L (ref 3.5–5.1)
Sodium: 133 mmol/L — ABNORMAL LOW (ref 135–145)

## 2023-03-05 LAB — GLUCOSE, CAPILLARY
Glucose-Capillary: 126 mg/dL — ABNORMAL HIGH (ref 70–99)
Glucose-Capillary: 129 mg/dL — ABNORMAL HIGH (ref 70–99)
Glucose-Capillary: 119 mg/dL — ABNORMAL HIGH (ref 70–99)

## 2023-03-05 LAB — MAGNESIUM: Magnesium: 1.8 mg/dL (ref 1.7–2.4)

## 2023-03-05 LAB — AMMONIA: Ammonia: 29 umol/L (ref 9–35)

## 2023-03-05 LAB — PHOSPHORUS: Phosphorus: 3 mg/dL (ref 2.5–4.6)

## 2023-03-05 MED ORDER — TRAVASOL 10 % IV SOLN
INTRAVENOUS | Status: AC
  Filled 2023-03-05: qty 998.4

## 2023-03-05 MED ORDER — POLYVINYL ALCOHOL 1.4 % OP SOLN
1.0000 [drp] | OPHTHALMIC | Status: DC | PRN
  Administered 2023-03-06 – 2023-03-24 (×4): 1 [drp] via OPHTHALMIC
  Filled 2023-03-05: qty 15

## 2023-03-05 NOTE — Progress Notes (Signed)
Inform Dr.Stechschulte of pupils size on the L eye non reactive with size of 5 mm.Per MD to monitor right now.Pt MAE and avle to follow commands.
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
## 2023-03-05 NOTE — Progress Notes (Signed)
Pt given haldol due to increased agitation, verbally abusive, inferring with care, trying to bite tele wires
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
## 2023-03-06 DIAGNOSIS — T148XXA Other injury of unspecified body region, initial encounter: Secondary | ICD-10-CM | POA: Diagnosis not present

## 2023-03-06 LAB — COMPREHENSIVE METABOLIC PANEL
ALT: 40 U/L (ref 0–44)
AST: 54 U/L — ABNORMAL HIGH (ref 15–41)
Albumin: 2.1 g/dL — ABNORMAL LOW (ref 3.5–5.0)
Alkaline Phosphatase: 46 U/L (ref 38–126)
Anion gap: 8 (ref 5–15)
BUN: 12 mg/dL (ref 8–23)
CO2: 24 mmol/L (ref 22–32)
Calcium: 8.2 mg/dL — ABNORMAL LOW (ref 8.9–10.3)
Chloride: 100 mmol/L (ref 98–111)
Creatinine, Ser: 0.76 mg/dL (ref 0.61–1.24)
GFR, Estimated: >60 mL/min (ref >60)
Glucose, Bld: 117 mg/dL — ABNORMAL HIGH (ref 70–99)
Potassium: 4.3 mmol/L (ref 3.5–5.1)
Sodium: 132 mmol/L — ABNORMAL LOW (ref 135–145)
Total Bilirubin: 0.7 mg/dL (ref 0.3–1.2)
Total Protein: 7.3 g/dL (ref 6.5–8.1)

## 2023-03-06 LAB — GLUCOSE, CAPILLARY
Glucose-Capillary: 104 mg/dL — ABNORMAL HIGH (ref 70–99)
Glucose-Capillary: 126 mg/dL — ABNORMAL HIGH (ref 70–99)
Glucose-Capillary: 128 mg/dL — ABNORMAL HIGH (ref 70–99)
Glucose-Capillary: 129 mg/dL — ABNORMAL HIGH (ref 70–99)
Glucose-Capillary: 148 mg/dL — ABNORMAL HIGH (ref 70–99)
Glucose-Capillary: 98 mg/dL (ref 70–99)

## 2023-03-06 LAB — TRIGLYCERIDES: Triglycerides: 67 mg/dL (ref <150)

## 2023-03-06 LAB — PHOSPHORUS: Phosphorus: 3.6 mg/dL (ref 2.5–4.6)

## 2023-03-06 LAB — MAGNESIUM: Magnesium: 1.8 mg/dL (ref 1.7–2.4)

## 2023-03-06 MED ORDER — TRAVASOL 10 % IV SOLN
INTRAVENOUS | Status: DC
  Filled 2023-03-06 (×3): qty 998.4

## 2023-03-06 MED ORDER — INSULIN ASPART 100 UNIT/ML IJ SOLN
0.0000 [IU] | Freq: Three times a day (TID) | INTRAMUSCULAR | Status: DC
  Administered 2023-03-06: 2 [IU] via SUBCUTANEOUS

## 2023-03-06 MED ORDER — SODIUM CHLORIDE 0.9 % IV SOLN: 12.5000 mg | Freq: Four times a day (QID) | INTRAVENOUS | Status: DC | PRN

## 2023-03-06 MED ORDER — TRAVASOL 10 % IV SOLN
INTRAVENOUS | Status: AC
  Filled 2023-03-06: qty 998.4
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
## 2023-03-06 NOTE — Consult Note (Signed)
WOC Nurse wound follow up Eakin from 03/01/23 still in place.  Will replace today with 1 piece flat ostomy pouch.  Distal wound bleeds with pouch removal today and we will exclude this from the pouching area and provide daily wound care.  No feculent effluent noted.   Wound type: trauma, s/p surgical repair.  Staple line intact, feculent effluent noted from staple line at 3 PM  Measurement: 0.4 cm opening with feculent effluent noted.  Eakin pouch with 150 Ml contained.   Wound bed: dehisced staple line at 3 o'clock, pouch with 1 piece flat pouch. Roll closure.  Staple line to midline  Distal end with bleeding around staple. 0.2 cm nonintact lesion.  I place Xeroform to wound bed and cover with 2x2 gauze, tape.  I cut pouch off center to avoid this area.  Drainage (amount, consistency, odor) moderate feculent effluent.  Periwound:staple line and nonintact wound to distal end Dressing procedure/placement/frequency:Continue 1 piece pouch. Supplies in room.  Will follow twice weekly.  Mike Gip MSN, RN, FNP-BC CWON Wound, Ostomy, Continence Nurse [facility] [phone] Pager [phone]
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
## 2023-03-07 DIAGNOSIS — K651 Peritoneal abscess: Secondary | ICD-10-CM | POA: Diagnosis not present

## 2023-03-07 DIAGNOSIS — S36499A Other injury of unspecified part of small intestine, initial encounter: Secondary | ICD-10-CM | POA: Diagnosis not present

## 2023-03-07 DIAGNOSIS — J9601 Acute respiratory failure with hypoxia: Secondary | ICD-10-CM | POA: Diagnosis not present

## 2023-03-07 DIAGNOSIS — T148XXA Other injury of unspecified body region, initial encounter: Secondary | ICD-10-CM | POA: Diagnosis not present

## 2023-03-07 DIAGNOSIS — Z23 Encounter for immunization: Secondary | ICD-10-CM | POA: Diagnosis not present

## 2023-03-07 LAB — GLUCOSE, CAPILLARY
Glucose-Capillary: 117 mg/dL — ABNORMAL HIGH (ref 70–99)
Glucose-Capillary: 120 mg/dL — ABNORMAL HIGH (ref 70–99)
Glucose-Capillary: 140 mg/dL — ABNORMAL HIGH (ref 70–99)

## 2023-03-07 LAB — BASIC METABOLIC PANEL
Anion gap: 7 (ref 5–15)
BUN: 13 mg/dL (ref 8–23)
CO2: 25 mmol/L (ref 22–32)
Calcium: 8.1 mg/dL — ABNORMAL LOW (ref 8.9–10.3)
Chloride: 100 mmol/L (ref 98–111)
Creatinine, Ser: 0.68 mg/dL (ref 0.61–1.24)
GFR, Estimated: >60 mL/min (ref >60)
Glucose, Bld: 115 mg/dL — ABNORMAL HIGH (ref 70–99)
Potassium: 4.3 mmol/L (ref 3.5–5.1)
Sodium: 132 mmol/L — ABNORMAL LOW (ref 135–145)

## 2023-03-07 LAB — MAGNESIUM: Magnesium: 2 mg/dL (ref 1.7–2.4)

## 2023-03-07 LAB — PHOSPHORUS: Phosphorus: 3.3 mg/dL (ref 2.5–4.6)

## 2023-03-07 MED ORDER — DIPHENHYDRAMINE HCL 50 MG/ML IJ SOLN
25.0000 mg | Freq: Four times a day (QID) | INTRAMUSCULAR | Status: DC | PRN
  Administered 2023-03-07 – 2023-03-08 (×3): 25 mg via INTRAVENOUS
  Filled 2023-03-07 (×4): qty 1

## 2023-03-07 MED ORDER — QUETIAPINE FUMARATE 50 MG PO TABS
25.0000 mg | ORAL_TABLET | Freq: Every day | ORAL | Status: DC
  Administered 2023-03-08 – 2023-03-31 (×22): 25 mg via ORAL
  Filled 2023-03-07 (×16): qty 1
  Filled 2023-03-07: qty 0.5
  Filled 2023-03-07 (×8): qty 1

## 2023-03-07 MED ORDER — TRAVASOL 10 % IV SOLN
INTRAVENOUS | Status: AC
  Filled 2023-03-07: qty 998.4

## 2023-03-07 MED ORDER — CLONIDINE HCL 0.1 MG PO TABS
0.1000 mg | ORAL_TABLET | Freq: Two times a day (BID) | ORAL | Status: DC
  Administered 2023-03-07 – 2023-04-03 (×52): 0.1 mg via ORAL
  Filled 2023-03-07 (×56): qty 1

## 2023-03-07 MED ORDER — GABAPENTIN 100 MG PO CAPS
100.0000 mg | ORAL_CAPSULE | Freq: Three times a day (TID) | ORAL | Status: DC
  Administered 2023-03-07 – 2023-03-31 (×72): 100 mg via ORAL
  Filled 2023-03-07 (×74): qty 1

## 2023-03-07 MED ORDER — METHOCARBAMOL 1000 MG/10ML IJ SOLN
1000.0000 mg | Freq: Three times a day (TID) | INTRAVENOUS | Status: DC | PRN
  Administered 2023-03-11: 1000 mg via INTRAVENOUS
  Filled 2023-03-07: qty 10
  Filled 2023-03-07: qty 1000
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
## 2023-03-07 NOTE — Progress Notes (Signed)
Mobility Specialist Progress Note   03/07/23 1530  Mobility  Activity Ambulated with assistance in hallway  Level of Assistance Contact guard assist, steadying assist  Assistive Device Front wheel walker  Distance Ambulated (ft) 480 ft  Range of Motion/Exercises Active;All extremities  Activity Response Tolerated well   Patient received in supine, pleasantly agreed to participate. Completed bed mobility independently and stood impulsively with supervision. Ambulated min guard with slow steady gait. Throughout session, patient was fixated on having a wheelchair or chair follow so he could rest. When offered options for rest, patient kindly declined. Did complain of intermittent lightheadedness. Was unable to obtain accurate oxygen saturation reading secondary inconsistent waveform on pulse oximeter. Returned to room without incident. Was left in supine with all needs met, call bell in reach and NT/Sitter present.   [place] Aracely Rickett, BS EXP Mobility Specialist Please contact via SecureChat or Rehab office at [phone]
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
## 2023-03-08 DIAGNOSIS — T148XXA Other injury of unspecified body region, initial encounter: Secondary | ICD-10-CM | POA: Diagnosis not present

## 2023-03-08 LAB — GLUCOSE, CAPILLARY
Glucose-Capillary: 116 mg/dL — ABNORMAL HIGH (ref 70–99)
Glucose-Capillary: 132 mg/dL — ABNORMAL HIGH (ref 70–99)
Glucose-Capillary: 136 mg/dL — ABNORMAL HIGH (ref 70–99)
Glucose-Capillary: 146 mg/dL — ABNORMAL HIGH (ref 70–99)
Glucose-Capillary: 135 mg/dL — ABNORMAL HIGH (ref 70–99)
Glucose-Capillary: 142 mg/dL — ABNORMAL HIGH (ref 70–99)

## 2023-03-08 MED ORDER — SODIUM CHLORIDE 0.9 % IV SOLN
25.0000 mg | Freq: Four times a day (QID) | INTRAVENOUS | Status: DC | PRN
  Administered 2023-03-09: 25 mg via INTRAVENOUS
  Filled 2023-03-08: qty 1

## 2023-03-08 MED ORDER — TRAVASOL 10 % IV SOLN
INTRAVENOUS | Status: AC
  Filled 2023-03-08: qty 998.4
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
## 2023-03-08 NOTE — TOC Progression Note (Signed)
Transition of Care [facility]) - Progression Note    Patient Details  Name: Godswill Taniguchi MRN: [id number] Date of Birth: 23-Dec-1955  [facility] of [facility] CM/SW Contact  Astrid Drafts Berna Spare, RN Phone Number: 03/08/2023, 5:08 PM  Clinical Narrative:    Patient in need of IP Psych Facility/likely [facility] referral. Referrals sent to:  [facility],  [facility], [facility]  Will follow with updates as available.    Expected Discharge Plan: Psychiatric Hospital Barriers to Discharge: Continued Medical Work up  Expected Discharge Plan and Services   Discharge Planning Services: CM Consult   Living arrangements for the past 2 months: Homeless                                       Social Determinants of Health (SDOH) Interventions SDOH Screenings   Tobacco Use: High Risk (02/24/2023)    Readmission Risk Interventions     No data to display         Quintella Baton, RN, BSN  Trauma/Neuro ICU Case Manager [phone]

## 2023-03-09 DIAGNOSIS — T148XXA Other injury of unspecified body region, initial encounter: Secondary | ICD-10-CM | POA: Diagnosis not present

## 2023-03-09 LAB — GLUCOSE, CAPILLARY
Glucose-Capillary: 160 mg/dL — ABNORMAL HIGH (ref 70–99)
Glucose-Capillary: 125 mg/dL — ABNORMAL HIGH (ref 70–99)
Glucose-Capillary: 115 mg/dL — ABNORMAL HIGH (ref 70–99)

## 2023-03-09 LAB — COMPREHENSIVE METABOLIC PANEL
ALT: 45 U/L — ABNORMAL HIGH (ref 0–44)
AST: 45 U/L — ABNORMAL HIGH (ref 15–41)
Albumin: 2.2 g/dL — ABNORMAL LOW (ref 3.5–5.0)
Alkaline Phosphatase: 59 U/L (ref 38–126)
Anion gap: 7 (ref 5–15)
BUN: 13 mg/dL (ref 8–23)
CO2: 25 mmol/L (ref 22–32)
Calcium: 8.4 mg/dL — ABNORMAL LOW (ref 8.9–10.3)
Chloride: 99 mmol/L (ref 98–111)
Creatinine, Ser: 0.79 mg/dL (ref 0.61–1.24)
GFR, Estimated: >60 mL/min (ref >60)
Glucose, Bld: 109 mg/dL — ABNORMAL HIGH (ref 70–99)
Potassium: 4 mmol/L (ref 3.5–5.1)
Sodium: 131 mmol/L — ABNORMAL LOW (ref 135–145)
Total Bilirubin: 1.1 mg/dL (ref 0.3–1.2)
Total Protein: 7.7 g/dL (ref 6.5–8.1)

## 2023-03-09 LAB — MAGNESIUM: Magnesium: 2.1 mg/dL (ref 1.7–2.4)

## 2023-03-09 LAB — PHOSPHORUS: Phosphorus: 3.3 mg/dL (ref 2.5–4.6)

## 2023-03-09 MED ORDER — SODIUM CHLORIDE 0.9 % IV BOLUS
500.0000 mL | Freq: Once | INTRAVENOUS | Status: AC
  Administered 2023-03-09: 500 mL via INTRAVENOUS

## 2023-03-09 MED ORDER — ADULT MULTIVITAMIN W/MINERALS CH
1.0000 | ORAL_TABLET | Freq: Every day | ORAL | Status: DC
  Administered 2023-03-10 – 2023-04-03 (×23): 1 via ORAL
  Filled 2023-03-09 (×25): qty 1

## 2023-03-09 MED ORDER — ENSURE ENLIVE PO LIQD
237.0000 mL | Freq: Three times a day (TID) | ORAL | Status: DC
  Administered 2023-03-09 – 2023-04-03 (×62): 237 mL via ORAL
  Filled 2023-03-09: qty 237
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
## 2023-03-09 NOTE — Consult Note (Signed)
WOC Nurse fistula consult note Fistula type/location: LUQ, EC fistula Perifistula assessment: NA Treatment options for perifistula skin: 2" ostomy barrier ring Output tan, milky, putting out smaller amounts  Fistula pouching: 1pc lock and roll, drainable ostomy pouch  Education provided: NA Patient in mittens, agitated, with sitter. No education at this time. Plans for IVC   WOC Nurse team will follow with you and see patient within 10 days. Please notify WOC nurses of any acute changes in the wounds or any new areas of concern Giordano Getman [facility] MSN, RN,CWOCN, CNS, CWON-AP [phone]
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
## 2023-03-09 NOTE — Plan of Care (Signed)
  Problem: Pain Managment: Goal: General experience of comfort will improve Outcome: Progressing   

## 2023-03-10 DIAGNOSIS — T148XXA Other injury of unspecified body region, initial encounter: Secondary | ICD-10-CM | POA: Diagnosis not present

## 2023-03-10 LAB — CREATININE, SERUM
Creatinine, Ser: 1.08 mg/dL (ref 0.61–1.24)
GFR, Estimated: >60 mL/min (ref >60)

## 2023-03-10 LAB — GLUCOSE, CAPILLARY: Glucose-Capillary: 108 mg/dL — ABNORMAL HIGH (ref 70–99)

## 2023-03-10 MED ORDER — DIPHENHYDRAMINE HCL 25 MG PO CAPS
25.0000 mg | ORAL_CAPSULE | Freq: Four times a day (QID) | ORAL | Status: DC | PRN
  Administered 2023-03-11 – 2023-04-03 (×47): 25 mg via ORAL
  Filled 2023-03-10 (×51): qty 1

## 2023-03-10 NOTE — Progress Notes (Signed)
PT Cancellation Note  Patient Details Name: Xaden Ritzman MRN: [id number] DOB: Aug 17, 1956   Cancelled Treatment:    Reason Eval/Treat Not Completed: Patient declined, no reason specified. Pt is eating at this time, refuses PT despite max encouragement. Pt finishes eating while PT in room, continues to decline ambulation. PT will follow up as time allows.   Arlyss Gandy 03/10/2023, 1:22 PM
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
## 2023-03-10 NOTE — TOC Progression Note (Addendum)
Transition of Care [facility]) - Progression Note    Patient Details  Name: [name] MRN: [id number] Date of Birth: [birth year]  [facility] CM/SW Contact  Glennon Mac, RN Phone Number: 03/10/2023, 4:30pm  Clinical Narrative:    IVC renewal completed; law enforcement notified for service only of IVC paperwork.   Addendum:  Patient denied by [facility], [facility] and [facility].   [facility] referral completed and faxed to [phone].  Will follow with updates as available.    Expected Discharge Plan: Psychiatric Hospital Barriers to Discharge: Continued Medical Work up  Expected Discharge Plan and Services   Discharge Planning Services: CM Consult   Living arrangements for the past 2 months: Homeless                                       Social Determinants of Health (SDOH) Interventions SDOH Screenings   Tobacco Use: High Risk (02/24/2023)    Readmission Risk Interventions     No data to display         Quintella Baton, RN, BSN  Trauma/Neuro ICU Case Manager [phone]

## 2023-03-11 DIAGNOSIS — F32A Depression, unspecified: Secondary | ICD-10-CM | POA: Diagnosis not present

## 2023-03-11 LAB — CBC
HCT: 29.5 % — ABNORMAL LOW (ref 39.0–52.0)
Hemoglobin: 9.3 g/dL — ABNORMAL LOW (ref 13.0–17.0)
MCH: 24.7 pg — ABNORMAL LOW (ref 26.0–34.0)
MCHC: 31.5 g/dL (ref 30.0–36.0)
MCV: 78.5 fL — ABNORMAL LOW (ref 80.0–100.0)
Platelets: 215 10*3/uL (ref 150–400)
RBC: 3.76 MIL/uL — ABNORMAL LOW (ref 4.22–5.81)
RDW: 18.7 % — ABNORMAL HIGH (ref 11.5–15.5)
WBC: 8.4 10*3/uL (ref 4.0–10.5)
nRBC: 0 % (ref 0.0–0.2)

## 2023-03-11 LAB — BASIC METABOLIC PANEL
Anion gap: 8 (ref 5–15)
BUN: 16 mg/dL (ref 8–23)
CO2: 22 mmol/L (ref 22–32)
Calcium: 8.2 mg/dL — ABNORMAL LOW (ref 8.9–10.3)
Chloride: 101 mmol/L (ref 98–111)
Creatinine, Ser: 0.84 mg/dL (ref 0.61–1.24)
GFR, Estimated: >60 mL/min (ref >60)
Glucose, Bld: 110 mg/dL — ABNORMAL HIGH (ref 70–99)
Potassium: 3.7 mmol/L (ref 3.5–5.1)
Sodium: 131 mmol/L — ABNORMAL LOW (ref 135–145)

## 2023-03-11 MED ORDER — QUETIAPINE FUMARATE 100 MG PO TABS
200.0000 mg | ORAL_TABLET | Freq: Every day | ORAL | Status: DC
  Administered 2023-03-11 – 2023-03-30 (×19): 200 mg via ORAL
  Filled 2023-03-11 (×21): qty 2

## 2023-03-11 NOTE — Progress Notes (Signed)
Patient noted to have an 8 beat run of v tach denies chest pain or discomfort. Dr. Janee Morn notified no new  orders received  at this time.
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
## 2023-03-11 NOTE — Progress Notes (Signed)
At the shift assessment pt wound dry, no drainage present.  At the midnight sitter c/o foul smell in pt room. This RN assessed wound and tan thin drainage present with foul smell. Wound cleansed and dressing changed. Trauma RN notified. Will continue to monitor for further drainage and notify Trauma team.
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
## 2023-03-12 DIAGNOSIS — F32A Depression, unspecified: Secondary | ICD-10-CM | POA: Diagnosis not present
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
## 2023-03-12 NOTE — Plan of Care (Signed)
  Problem: Pain Managment: Goal: General experience of comfort will improve Outcome: Progressing   
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
## 2023-03-13 DIAGNOSIS — T148XXA Other injury of unspecified body region, initial encounter: Secondary | ICD-10-CM | POA: Diagnosis not present

## 2023-03-13 MED ORDER — METHOCARBAMOL 750 MG PO TABS
750.0000 mg | ORAL_TABLET | Freq: Three times a day (TID) | ORAL | Status: DC
  Administered 2023-03-13 – 2023-03-18 (×15): 750 mg via ORAL
  Filled 2023-03-13 (×15): qty 1

## 2023-03-13 MED ORDER — CHLORPROMAZINE HCL 25 MG PO TABS
25.0000 mg | ORAL_TABLET | Freq: Three times a day (TID) | ORAL | Status: DC | PRN
  Administered 2023-03-13 – 2023-03-14 (×4): 25 mg via ORAL
  Filled 2023-03-13 (×6): qty 1

## 2023-03-13 MED ORDER — SODIUM CHLORIDE 0.9 % IV SOLN
25.0000 mg | Freq: Four times a day (QID) | INTRAVENOUS | Status: DC | PRN
  Administered 2023-03-13: 25 mg via INTRAVENOUS
  Filled 2023-03-13: qty 1
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
## 2023-03-13 NOTE — Progress Notes (Signed)
CSW spoke with Dr. Gasper Sells - CSW informed MD that patient was referred to [facility] on 5/24 by Raynelle Fanning, RN CM.   Edwin Dada, MSW, LCSW Transitions of Care  Clinical Social Worker II [phone]

## 2023-03-13 NOTE — Consult Note (Signed)
Fistula pouching DCed last week, will sign off  Derrian Poli [facility], CNS, [employer] [phone]
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
## 2023-03-14 LAB — CBC
HCT: 29.8 % — ABNORMAL LOW (ref 39.0–52.0)
Hemoglobin: 9.4 g/dL — ABNORMAL LOW (ref 13.0–17.0)
MCH: 24.4 pg — ABNORMAL LOW (ref 26.0–34.0)
MCHC: 31.5 g/dL (ref 30.0–36.0)
MCV: 77.2 fL — ABNORMAL LOW (ref 80.0–100.0)
Platelets: 177 10*3/uL (ref 150–400)
RBC: 3.86 MIL/uL — ABNORMAL LOW (ref 4.22–5.81)
RDW: 18.1 % — ABNORMAL HIGH (ref 11.5–15.5)
WBC: 8 10*3/uL (ref 4.0–10.5)
nRBC: 0 % (ref 0.0–0.2)

## 2023-03-14 LAB — BASIC METABOLIC PANEL
Anion gap: 8 (ref 5–15)
BUN: 12 mg/dL (ref 8–23)
CO2: 26 mmol/L (ref 22–32)
Calcium: 7.7 mg/dL — ABNORMAL LOW (ref 8.9–10.3)
Chloride: 100 mmol/L (ref 98–111)
Creatinine, Ser: 0.78 mg/dL (ref 0.61–1.24)
GFR, Estimated: >60 mL/min (ref >60)
Glucose, Bld: 113 mg/dL — ABNORMAL HIGH (ref 70–99)
Potassium: 3.6 mmol/L (ref 3.5–5.1)
Sodium: 134 mmol/L — ABNORMAL LOW (ref 135–145)

## 2023-03-14 MED ORDER — GUAIFENESIN 100 MG/5ML PO LIQD
10.0000 mL | ORAL | Status: AC
  Administered 2023-03-14 – 2023-03-22 (×36): 10 mL via ORAL
  Filled 2023-03-14 (×41): qty 10

## 2023-03-14 NOTE — TOC Progression Note (Signed)
Transition of Care [facility]) - Progression Note    Patient Details  Name: [name] MRN: [id number] Date of Birth: 1955/10/21  Transition of Care [facility]) CM/SW Contact  Astrid Drafts Berna Spare, RN Phone Number: 03/14/2023, 4:23 PM  Clinical Narrative:    [facility] unable to confirm that patient is on the list for potential admission.  Verbal intake completed and referral refaxed to [facility] intake.  Will call to ensure that facility has received fax.   Expected Discharge Plan: Psychiatric Hospital Barriers to Discharge: Continued Medical Work up  Expected Discharge Plan and Services   Discharge Planning Services: CM Consult   Living arrangements for the past 2 months: Homeless                                       Social Determinants of Health (SDOH) Interventions SDOH Screenings   Tobacco Use: High Risk (02/24/2023)    Readmission Risk Interventions     No data to display         Quintella Baton, RN, BSN  Trauma/Neuro ICU Case Manager [phone]
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
## 2023-03-15 DIAGNOSIS — T148XXA Other injury of unspecified body region, initial encounter: Secondary | ICD-10-CM | POA: Diagnosis not present

## 2023-03-15 MED ORDER — CHLORPROMAZINE HCL 100 MG PO TABS
100.0000 mg | ORAL_TABLET | Freq: Two times a day (BID) | ORAL | Status: DC
  Administered 2023-03-15 – 2023-04-03 (×37): 100 mg via ORAL
  Filled 2023-03-15 (×41): qty 1

## 2023-03-15 NOTE — Consult Note (Signed)
WOC Nurse Consult Note: Reason for Consult: Asked to evaluate midline wound due to irritation and new onset tan drainage around staples.   Casimiro Needle, PA has ordered for dry dressing to this area.   Wound type: inflammatory Pressure Injury POA: NA Measurement: Erythema around midline staple line.  Open wound at 3 o'clock. No longer pouching this, dry dressing only.  Wound bed:0.1 cm open wound bed along staple line with fibrin noted.  Drainage (amount, consistency, odor) minimal tan effluent, irritating to periwound skin.  Periwound: erythema and tenderness.  Dressing procedure/placement/frequency: Cleanse midline wounds with soap and water and pat dry Apply Xeroform over staple line and adjacent abdominal wound (at 3:00)  cover with ABD and tape.  Change daily.  Will not follow at this time.  Please re-consult if needed.  Mike Gip MSN, RN, FNP-BC CWON Wound, Ostomy, Continence Nurse Outpatient [facility] [phone] Pager [phone]

## 2023-03-15 NOTE — Progress Notes (Signed)
PT requesting to take pt outside for fresh air. Per psych (NP Rosario Adie) pt has permission to go outside with staff only but with caution as pt is a SI case.

## 2023-03-15 NOTE — TOC Progression Note (Signed)
Transition of Care [facility]) - Progression Note    Patient Details  Name: [name] MRN: [id number] Date of Birth: [birth year]  [facility]) CM/SW Contact  Astrid Drafts Berna Spare, RN Phone Number: 03/15/2023, 5:26 PM  Clinical Narrative:    Received call from [facility] requesting last 4 days of surgical MD progress notes.  Clinical information faxed as requested.   Expected Discharge Plan: Psychiatric Hospital Barriers to Discharge: Continued Medical Work up  Expected Discharge Plan and Services   Discharge Planning Services: CM Consult   Living arrangements for the past 2 months: Homeless                                       Social Determinants of Health (SDOH) Interventions SDOH Screenings   Tobacco Use: High Risk (02/24/2023)    Readmission Risk Interventions     No data to display         Quintella Baton, RN, BSN  Trauma/Neuro ICU Case Manager [phone]
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
## 2023-03-15 NOTE — Progress Notes (Signed)
PT Cancellation Note  Patient Details Name: [name] MRN: [id number] DOB: 1955-11-18   Cancelled Treatment:    Reason Eval/Treat Not Completed: Patient declined, no reason specified; encouraged pt for OOB to go outside, but pt continued to refuse.  Will attempt another day.    [name] 03/15/2023, 4:48 PM Sheran Lawless, PT Acute Rehabilitation Services Office:[phone] 03/15/2023
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
## 2023-03-16 NOTE — Progress Notes (Signed)
1404- Noted pt abdominal drsg saturated through abd pad with bright red drainage. During dressing change noted increased drainage with pt coughing. Educated pt on bracing abd while coughing. PA Maczis made aware see new order for abd binder. Abd placed.

## 2023-03-16 NOTE — Progress Notes (Signed)
Patient refusing to wear abdominal binder. He said it was too tight and causing him to sweat. Offered to loosen the binder but patient began taking the binder off. Dressing has scant amount of blood. Dr. Sheliah Hatch notified of refusal. Will continue to monitor patient throughout the night.
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
## 2023-03-17 LAB — CREATININE, SERUM
Creatinine, Ser: 0.91 mg/dL (ref 0.61–1.24)
GFR, Estimated: >60 mL/min (ref >60)
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
## 2023-03-17 NOTE — Progress Notes (Addendum)
TOC CSW had [employer] sign and served IVC to pt.  3 copies made and charted.    [name], MSW, LCSW-A Pronouns:  She/Her/Hers [facility] HealthTransitions of Care Clinical Social Worker Direct Number:  [phone] [email or web address]

## 2023-03-17 NOTE — TOC Progression Note (Signed)
Transition of Care [facility]) - Progression Note    Patient Details  Name: [name] MRN: [id number] Date of Birth: 02/05/1956  Transition of [facility]) CM/SW Contact  Astrid Drafts Berna Spare, RN Phone Number: 03/17/2023, 1:51 PM  Clinical Narrative:    Telephone call to [facility] for update: Intake states that case is currently in review with MD.  No additional clinical information needed at this time.   Expected Discharge Plan: [facility] Hospital Barriers to Discharge: Continued Medical Work up  Expected Discharge Plan and Services   Discharge Planning Services: CM Consult   Living arrangements for the past 2 months: Homeless                                       Social Determinants of Health (SDOH) Interventions SDOH Screenings   Tobacco Use: High Risk (02/24/2023)    Readmission Risk Interventions     No data to display         Quintella Baton, RN, BSN  Trauma/Neuro ICU Case Manager [phone]
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
## 2023-03-17 NOTE — Care Management Important Message (Signed)
Important Message  Patient Details  Name: Enderson Riopelle MRN: [id number] Date of Birth: 11/05/1955   Medicare Important Message Given:  Yes     Sherilyn Banker 03/17/2023, 3:03 PM

## 2023-03-17 NOTE — TOC Progression Note (Addendum)
Transition of Care [facility]) - Progression Note    Patient Details  Name: Nakota Lichtenstein MRN: [id number] Date of Birth: 1956-04-20  [facility]) CM/SW Contact  Lorri Frederick, LCSW Phone Number: 03/17/2023, 3:56 PM  Clinical Narrative:   IVC paperwork scanned into epic, sent to magistrate, [id number] case number, [id number] envelope number.  Magistrate called back, it has to be submitted as a new case, needs new envelope number.   1610: IVC paperwork resubmitted as new case through efile.  TC Magistrate to notify of envelope [id number].  Case number received: [id number]  Expected Discharge Plan: [facility] Hospital Barriers to Discharge: Continued Medical Work up  Expected Discharge Plan and Services   Discharge Planning Services: CM Consult   Living arrangements for the past 2 months: Homeless                                       Social Determinants of Health (SDOH) Interventions SDOH Screenings   Tobacco Use: High Risk (02/24/2023)    Readmission Risk Interventions     No data to display

## 2023-03-18 DIAGNOSIS — T148XXA Other injury of unspecified body region, initial encounter: Secondary | ICD-10-CM | POA: Diagnosis not present

## 2023-03-18 DIAGNOSIS — T1491XA Suicide attempt, initial encounter: Secondary | ICD-10-CM | POA: Diagnosis not present

## 2023-03-18 LAB — GLUCOSE, CAPILLARY: Glucose-Capillary: 113 mg/dL — ABNORMAL HIGH (ref 70–99)

## 2023-03-18 MED ORDER — MORPHINE SULFATE (PF) 2 MG/ML IV SOLN
2.0000 mg | INTRAVENOUS | Status: DC | PRN
  Administered 2023-03-18 – 2023-03-29 (×15): 2 mg via INTRAVENOUS
  Filled 2023-03-18 (×17): qty 1

## 2023-03-18 MED ORDER — BACLOFEN 10 MG PO TABS
10.0000 mg | ORAL_TABLET | Freq: Three times a day (TID) | ORAL | Status: DC
  Administered 2023-03-18 – 2023-03-20 (×9): 10 mg via ORAL
  Filled 2023-03-18 (×9): qty 1

## 2023-03-18 MED ORDER — OXYCODONE HCL 5 MG PO TABS
5.0000 mg | ORAL_TABLET | ORAL | Status: DC | PRN
  Administered 2023-03-18 – 2023-03-29 (×19): 5 mg via ORAL
  Filled 2023-03-18 (×20): qty 1
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
## 2023-03-19 LAB — AMMONIA: Ammonia: 46 umol/L — ABNORMAL HIGH (ref 9–35)
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
## 2023-03-20 ENCOUNTER — Inpatient Hospital Stay (HOSPITAL_COMMUNITY): Payer: Medicare HMO

## 2023-03-20 LAB — BASIC METABOLIC PANEL
Anion gap: 9 (ref 5–15)
BUN: 15 mg/dL (ref 8–23)
CO2: 25 mmol/L (ref 22–32)
Calcium: 8.8 mg/dL — ABNORMAL LOW (ref 8.9–10.3)
Chloride: 100 mmol/L (ref 98–111)
Creatinine, Ser: 0.95 mg/dL (ref 0.61–1.24)
GFR, Estimated: >60 mL/min (ref >60)
Glucose, Bld: 121 mg/dL — ABNORMAL HIGH (ref 70–99)
Potassium: 3.9 mmol/L (ref 3.5–5.1)
Sodium: 134 mmol/L — ABNORMAL LOW (ref 135–145)

## 2023-03-20 LAB — CBC
HCT: 30 % — ABNORMAL LOW (ref 39.0–52.0)
Hemoglobin: 9.5 g/dL — ABNORMAL LOW (ref 13.0–17.0)
MCH: 24.9 pg — ABNORMAL LOW (ref 26.0–34.0)
MCHC: 31.7 g/dL (ref 30.0–36.0)
MCV: 78.5 fL — ABNORMAL LOW (ref 80.0–100.0)
Platelets: 154 10*3/uL (ref 150–400)
RBC: 3.82 MIL/uL — ABNORMAL LOW (ref 4.22–5.81)
RDW: 18.3 % — ABNORMAL HIGH (ref 11.5–15.5)
WBC: 11.9 10*3/uL — ABNORMAL HIGH (ref 4.0–10.5)
nRBC: 0 % (ref 0.0–0.2)

## 2023-03-20 LAB — URINALYSIS, ROUTINE W REFLEX MICROSCOPIC
Bilirubin Urine: NEGATIVE
Glucose, UA: NEGATIVE mg/dL
Hgb urine dipstick: NEGATIVE
Ketones, ur: NEGATIVE mg/dL
Nitrite: POSITIVE — AB
Protein, ur: 30 mg/dL — AB
Specific Gravity, Urine: 1.031 — ABNORMAL HIGH (ref 1.005–1.030)
WBC, UA: >50 WBC/hpf (ref 0–5)
pH: 5 (ref 5.0–8.0)

## 2023-03-20 LAB — AMMONIA: Ammonia: 34 umol/L (ref 9–35)

## 2023-03-20 MED ORDER — PIPERACILLIN-TAZOBACTAM 3.375 G IVPB
3.3750 g | Freq: Three times a day (TID) | INTRAVENOUS | Status: DC
  Administered 2023-03-20 – 2023-03-24 (×11): 3.375 g via INTRAVENOUS
  Filled 2023-03-20 (×11): qty 50

## 2023-03-20 MED ORDER — IOHEXOL 350 MG/ML SOLN
75.0000 mL | Freq: Once | INTRAVENOUS | Status: AC | PRN
  Administered 2023-03-20: 75 mL via INTRAVENOUS
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
## 2023-03-20 NOTE — Progress Notes (Signed)
Pt very irritable this am. Refused all his am meds by spitting them out with the exception of liquid form. PA Michael on unit notified. Dionne Bucy RN

## 2023-03-20 NOTE — Progress Notes (Signed)
Per CCMD, pt had a 9 Run of SVT. MD notified.

## 2023-03-20 NOTE — TOC Progression Note (Signed)
Transition of Care [facility]) - Progression Note    Patient Details  Name: [name] MRN: [id number] Date of Birth: 02-Jun-1956  Transition of Care [facility]) CM/SW Contact  Astrid Drafts Berna Spare, RN Phone Number: 03/20/2023, 10:24 AM  Clinical Narrative:    [facility] Intake for update; they state he is on waiting list for admission to facility.   Expected Discharge Plan: [facility] Hospital Barriers to Discharge: Continued Medical Work up  Expected Discharge Plan and Services   Discharge Planning Services: CM Consult   Living arrangements for the past 2 months: Homeless                                       Social Determinants of Health (SDOH) Interventions SDOH Screenings   Tobacco Use: High Risk (02/24/2023)    Readmission Risk Interventions     No data to display         Quintella Baton, RN, BSN  Trauma/Neuro ICU Case Manager [phone]
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
## 2023-03-21 ENCOUNTER — Inpatient Hospital Stay (HOSPITAL_COMMUNITY): Payer: Medicare HMO

## 2023-03-21 LAB — PROTIME-INR
INR: 1.2 (ref 0.8–1.2)
Prothrombin Time: 15.8 seconds — ABNORMAL HIGH (ref 11.4–15.2)

## 2023-03-21 MED ORDER — LIDOCAINE HCL 1 % IJ SOLN
10.0000 mL | Freq: Once | INTRAMUSCULAR | Status: DC
  Filled 2023-03-21: qty 10

## 2023-03-21 MED ORDER — SODIUM CHLORIDE 0.9% FLUSH
5.0000 mL | Freq: Three times a day (TID) | INTRAVENOUS | Status: DC
  Administered 2023-03-21 – 2023-03-29 (×23): 5 mL

## 2023-03-21 MED ORDER — MIDAZOLAM HCL 2 MG/2ML IJ SOLN
INTRAMUSCULAR | Status: AC
  Filled 2023-03-21: qty 2

## 2023-03-21 MED ORDER — FENTANYL CITRATE (PF) 100 MCG/2ML IJ SOLN
INTRAMUSCULAR | Status: AC
  Filled 2023-03-21: qty 2

## 2023-03-21 MED ORDER — MIDAZOLAM HCL 2 MG/2ML IJ SOLN
INTRAMUSCULAR | Status: AC | PRN
  Administered 2023-03-21: .5 mg via INTRAVENOUS

## 2023-03-21 MED ORDER — SODIUM CHLORIDE 0.9 % IV SOLN: INTRAVENOUS | Status: DC

## 2023-03-21 NOTE — Progress Notes (Signed)
Patient is somnolence this morning, unable to open his eyes, tried to give his morning medications but he was unable to follow direction and was coughing with water, PA Casimiro Needle aware, will consult SLP

## 2023-03-21 NOTE — Procedures (Signed)
Interventional Radiology Procedure Note  Procedure: CT Guided Drainage of peritoneal abscess  Complications: None  Estimated Blood Loss: < 10 mL  Findings: 12 Fr drain placed in midline peritoneal abscess with return of purulent fluid. Fluid sample sent for culture analysis. Drain attached to suction bulb drainage.  Will follow.  Jodi Marble. Fredia Sorrow, M.D Pager:  [phone]

## 2023-03-21 NOTE — Plan of Care (Signed)
  Problem: Pain Managment: Goal: General experience of comfort will improve Outcome: Not Progressing   

## 2023-03-21 NOTE — Progress Notes (Signed)
SLP Cancellation Note  Patient Details Name: Nivan Mckeague MRN: [id number] DOB: 14-Feb-1956   Cancelled treatment:       Reason Eval/Treat Not Completed: Patient at procedure or test/unavailable. Swallow eval ordered but pt also NPO for IR procedure. Will f/u when appropriate   Marte Celani, Riley Nearing 03/21/2023, 11:23 AM
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
## 2023-03-21 NOTE — Progress Notes (Signed)
Patient is fully alert and awake now, he passed the bedside swallow eval [name]), will resume his diet and DC SLP.

## 2023-03-21 NOTE — Progress Notes (Signed)
26 Days Post-Op  Subjective: T max 100.8 overnight. Tachycardia improved. No hypotension. WBC 11.9. CT A/P with intra-abdominal fluid collection. IR planning drainage today.   Patient more awake and alert today. Won't open eyes for me but will follow commands x 4 extremities and denies any pain or complaints. Voiding. No bm yesterday.   Objective: Vital signs in last 24 hours: Temp:  [98.3 F (36.8 C)-100.8 F (38.2 C)] 99.9 F (37.7 C) (06/04 0747) Pulse Rate:  [68-105] 82 (06/04 0747) Resp:  [18-22] 22 (06/04 0747) BP: (119-146)/(62-67) 146/63 (06/04 0747) SpO2:  [93 %-97 %] 94 % (06/04 0747) Last BM Date : 03/18/23  Intake/Output from previous day: 06/03 0701 - 06/04 0700 In: 609.4 [P.O.:15; I.V.:544.4; IV Piggyback:50] Out: 1150 [Urine:1150] Intake/Output this shift: No intake/output data recorded.  PE: Gen: Does not open eyes to voice but will f/c otherwise and answer questions.  Card:  Reg Pulm:  CTAB, no W/R/R, effort normal. On RA.  Abd: Soft, ND, NT. Midline wound stable from 6/3 note Msk: No LE edema     Lab Results:  Recent Labs    03/20/23 1122  WBC 11.9*  HGB 9.5*  HCT 30.0*  PLT 154   BMET Recent Labs    03/20/23 1122  NA 134*  K 3.9  CL 100  CO2 25  GLUCOSE 121*  BUN 15  CREATININE 0.95  CALCIUM 8.8*    PT/INR No results for input(s): "LABPROT", "INR" in the last 72 hours. CMP     Component Value Date/Time   NA 134 (L) 03/20/2023 1122   K 3.9 03/20/2023 1122   CL 100 03/20/2023 1122   CO2 25 03/20/2023 1122   GLUCOSE 121 (H) 03/20/2023 1122   BUN 15 03/20/2023 1122   CREATININE 0.95 03/20/2023 1122   CALCIUM 8.8 (L) 03/20/2023 1122   PROT 7.7 03/09/2023 0441   ALBUMIN 2.2 (L) 03/09/2023 0441   AST 45 (H) 03/09/2023 0441   ALT 45 (H) 03/09/2023 0441   ALKPHOS 59 03/09/2023 0441   BILITOT 1.1 03/09/2023 0441   GFRNONAA >60 03/20/2023 1122   Lipase  No results found for: "LIPASE"  Studies/Results: CT ABDOMEN PELVIS W  CONTRAST  Result Date: 03/20/2023 CLINICAL DATA:  Abdominal pain, post-op s/p exlap, extensive adhesiolysis, primary repair of small bowel injury x2 on 5/9 by LK. Fever overnight. Drainage from L abdominal wound. NO PO CONTRAST EXAM: CT ABDOMEN AND PELVIS WITH CONTRAST TECHNIQUE: Multidetector CT imaging of the abdomen and pelvis was performed using the standard protocol following bolus administration of intravenous contrast. RADIATION DOSE REDUCTION: This exam was performed according to the departmental dose-optimization program which includes automated exposure control, adjustment of the mA and/or kV according to patient size and/or use of iterative reconstruction technique. CONTRAST:  75mL OMNIPAQUE IOHEXOL 350 MG/ML SOLN COMPARISON:  02/02/2023 FINDINGS: Lower chest: No pleural or pericardial effusion. Scattered coronary calcifications. Dependent atelectasis or peripheral consolidation in the posterior basal segment left lower lobe. Hepatobiliary: Nodular contour suggesting cirrhosis. No focal liver lesion or biliary ductal dilatation. Cholecystectomy clips. Pancreas: Unremarkable. No pancreatic ductal dilatation or surrounding inflammatory changes. Spleen: Upper limits normal in size, without focal lesion. Small accessory splenule. Adrenals/Urinary Tract: No adrenal mass. Symmetric renal parenchymal enhancement. No evident urolithiasis or hydronephrosis. Urinary bladder is physiologically distended. Stomach/Bowel: Stomach is nondistended, unremarkable. Small bowel decompressed. The colon is partially distended by fluid proximally, gas and fecal material distally, without focal wall thickening. Vascular/Lymphatic: Mild scattered aortic calcified plaque without aneurysm.  Portal vein patent. No abdominal or pelvic adenopathy. Reproductive: Mild prostate enlargement. Other: Bilobed peripherally enhancing fluid collection in the right mid abdomen, largest component 8.6 cm centrally (Im41,Se7) , adjacent potentially  contiguous 5.2 cm more lateral, inferior to the liver on image 46. Focal mesenteric inflammatory/edematous changes anteriorly in the mid abdomen deep to the midline metallic sutures. No ascites. No free air. Musculoskeletal: Marked rectus diastasis. Degenerative disc disease L5-S1. IMPRESSION: 1. Bilobed peripherally enhancing fluid collection in the right mid abdomen, suspicious for abscess. 2. Nodular hepatic contour suggesting cirrhosis. 3.  Aortic Atherosclerosis (ICD10-I70.0). Electronically Signed   By: Corlis Leak M.D.   On: 03/20/2023 14:45   CT HEAD WO CONTRAST ( )  Result Date: 03/20/2023 CLINICAL DATA:  Mental status change, unknown cause EXAM: CT HEAD WITHOUT CONTRAST TECHNIQUE: Contiguous axial images were obtained from the base of the skull through the vertex without intravenous contrast. RADIATION DOSE REDUCTION: This exam was performed according to the departmental dose-optimization program which includes automated exposure control, adjustment of the mA and/or kV according to patient size and/or use of iterative reconstruction technique. COMPARISON:  CT Head 01/24/23 FINDINGS: Brain: No evidence of acute infarction, hemorrhage, hydrocephalus, extra-axial collection or mass lesion/mass effect. Vascular: No hyperdense vessel or unexpected calcification. Skull: Normal. Negative for fracture or focal lesion. Sinuses/Orbits: No middle ear or mastoid effusion. Paranasal sinuses are clear. Orbits are notable for left lens replacement, otherwise unremarkable. Other: None. IMPRESSION: No acute intracranial abnormality. Electronically Signed   By: Lorenza Cambridge M.D.   On: 03/20/2023 14:39   DG CHEST PORT 1 VIEW  Result Date: 03/20/2023 CLINICAL DATA:  [id number] Fever [id number] EXAM: PORTABLE CHEST - 1 VIEW COMPARISON:  02/23/2023 FINDINGS: Patient extubated and the gastric tube removed. Relatively low lung volumes, with bronchovascular crowding in the lung bases, patchy left infrahilar atelectasis or  infiltrate. Heart size upper limits normal. Aortic Atherosclerosis (ICD10-170.0). No effusion. Visualized bones unremarkable. IMPRESSION: Low volumes with patchy left infrahilar atelectasis or infiltrate. Electronically Signed   By: Corlis Leak M.D.   On: 03/20/2023 12:15    Anti-infectives: Anti-infectives (From admission, onward)    Start     Dose/Rate Route Frequency Ordered Stop   03/20/23 1800  piperacillin-tazobactam (ZOSYN) IVPB 3.375 g        3.375 g 12.5 mL/hr over 240 Minutes Intravenous Every 8 hours 03/20/23 1625     02/23/23 2230  ceFEPIme (MAXIPIME) 2 g in sodium chloride 0.9 % 100 mL IVPB  Status:  Discontinued        2 g 200 mL/hr over 30 Minutes Intravenous  Once 02/23/23 2244 02/23/23 2247   02/23/23 2230  ceFAZolin (ANCEF) IVPB 2g/100 mL premix        2 g 200 mL/hr over 30 Minutes Intravenous  Once 02/23/23 2249 02/24/23 0046        Assessment/Plan SISW to abdomen   SW to abdomen - s/p exlap, extensive adhesiolysis, primary repair of small bowel injury x2 on 5/9 by LK. - Midline wound. Monitor drainage. Xeroform over macerated tissue. Will ask md when staples can be removed.  - CT A/P 6/3 with intra-abdominal fluid collection. Started IV abx. IR consult for drainage.  Suicide attempt - psych following, IVC, plan [facility], appreciate Psychiatry F/U Acute hypoxic respiratory failure - extubated 5/10. Continue Mucinex  Alcohol abuse - CIWA, phenobarb tapered off, prn ativan and haldol, TOC c/s. CTH neg 6/3. Ammonia normal 6/3. On scheduled lactulose (did receive this am).  Hiccups - Home  Thorazine 100mg  BID Decreased LOC - CTH neg 6/3. Ammonia normal 6/3. More awake today and f/c x 4 extremities/answering questions but still not as awake and alert as he was last week when I saw him. Will discuss with MD about adjusting Seroquel, Haldol, Ativan etc and if any further w/u warranted.   FEN - NPO for IR. IVF at 26ml/hr while npo DVT - SCDs, LMWH ID -  cefepime peri-op. Zosyn 6/3 >> for intra-abdominal fluid collection. UCx pending.  Dispo - Abx. IR consult. Working towards [facility]    LOS: 26 days    Jacinto Halim , [facility] Surgery 03/21/2023, 9:06 AM Please see Amion for pager number during day hours 7:00am-4:30pm
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
## 2023-03-21 NOTE — TOC Progression Note (Signed)
Transition of Care [facility]) - Progression Note    Patient Details  Name: Emer Kan MRN: [id number] Date of Birth: Apr 09, 1956  Transition of Care [facility]) CM/SW Contact  Glennon Mac, RN Phone Number: 03/21/2023, 4:08 PM  Clinical Narrative:     [facility] Intake for update; they state he is on waiting list for admission to facility.   [facility] Discharge Plan: [facility] Barriers to Discharge: Continued Medical Work up  Expected Discharge Plan and Services   Discharge Planning Services: CM Consult   Living arrangements for the past 2 months: Homeless                                       Social Determinants of Health (SDOH) Interventions SDOH Screenings   Tobacco Use: High Risk (02/24/2023)    Readmission Risk Interventions     No data to display         Quintella Baton, RN, BSN  Trauma/Neuro ICU Case Manager [phone]

## 2023-03-21 NOTE — Consult Note (Signed)
Patient off the unit for surgery. Attempted to assess x 2. Will attempt to reassess tomorrow.

## 2023-03-22 DIAGNOSIS — T148XXA Other injury of unspecified body region, initial encounter: Secondary | ICD-10-CM | POA: Diagnosis not present

## 2023-03-22 LAB — URINALYSIS, W/ REFLEX TO CULTURE (INFECTION SUSPECTED)
Bacteria, UA: NONE SEEN
Bilirubin Urine: NEGATIVE
Glucose, UA: NEGATIVE mg/dL
Hgb urine dipstick: NEGATIVE
Ketones, ur: NEGATIVE mg/dL
Nitrite: NEGATIVE
Protein, ur: NEGATIVE mg/dL
Specific Gravity, Urine: 1.008 (ref 1.005–1.030)
pH: 5 (ref 5.0–8.0)

## 2023-03-22 LAB — URINE CULTURE
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
## 2023-03-23 LAB — CBC
HCT: 29.8 % — ABNORMAL LOW (ref 39.0–52.0)
Hemoglobin: 9.2 g/dL — ABNORMAL LOW (ref 13.0–17.0)
MCH: 24.4 pg — ABNORMAL LOW (ref 26.0–34.0)
MCHC: 30.9 g/dL (ref 30.0–36.0)
MCV: 79 fL — ABNORMAL LOW (ref 80.0–100.0)
Platelets: 197 10*3/uL (ref 150–400)
RBC: 3.77 MIL/uL — ABNORMAL LOW (ref 4.22–5.81)
RDW: 17.7 % — ABNORMAL HIGH (ref 11.5–15.5)
WBC: 4.5 10*3/uL (ref 4.0–10.5)
nRBC: 0 % (ref 0.0–0.2)

## 2023-03-23 LAB — BASIC METABOLIC PANEL
Anion gap: 6 (ref 5–15)
BUN: 13 mg/dL (ref 8–23)
CO2: 24 mmol/L (ref 22–32)
Calcium: 8.2 mg/dL — ABNORMAL LOW (ref 8.9–10.3)
Chloride: 105 mmol/L (ref 98–111)
Creatinine, Ser: 0.8 mg/dL (ref 0.61–1.24)
GFR, Estimated: >60 mL/min (ref >60)
Glucose, Bld: 107 mg/dL — ABNORMAL HIGH (ref 70–99)
Potassium: 3.5 mmol/L (ref 3.5–5.1)
Sodium: 135 mmol/L (ref 135–145)

## 2023-03-23 LAB — URINE CULTURE: Culture: NO GROWTH

## 2023-03-23 LAB — AEROBIC/ANAEROBIC CULTURE W GRAM STAIN (SURGICAL/DEEP WOUND)

## 2023-03-23 MED ORDER — HYDROCORTISONE 1 % EX CREA
TOPICAL_CREAM | Freq: Two times a day (BID) | CUTANEOUS | Status: DC
  Administered 2023-03-24 – 2023-04-02 (×9): 1 via TOPICAL
  Filled 2023-03-23: qty 28

## 2023-03-23 NOTE — TOC Progression Note (Signed)
Transition of Care [facility]) - Progression Note    Patient Details  Name: [name] MRN: [id number] Date of Birth: [birth year]  Transition of Care [facility]) CM/SW Contact  Astrid Drafts Berna Spare, RN Phone Number: 03/23/2023, 1:30pm  Clinical Narrative:    Received call from [facility]; patient remains on waiting list for potential admission to facility.  Plan renewal of IVC on 03/24/2023.   Expected Discharge Plan: Psychiatric Hospital Barriers to Discharge: Continued Medical Work up  Expected Discharge Plan and Services   Discharge Planning Services: CM Consult   Living arrangements for the past 2 months: Homeless                                       Social Determinants of Health (SDOH) Interventions SDOH Screenings   Tobacco Use: High Risk (02/24/2023)    Readmission Risk Interventions     No data to display         Quintella Baton, RN, BSN  Trauma/Neuro ICU Case Manager [phone]
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
## 2023-03-24 DIAGNOSIS — T148XXA Other injury of unspecified body region, initial encounter: Secondary | ICD-10-CM | POA: Diagnosis not present

## 2023-03-24 LAB — AEROBIC/ANAEROBIC CULTURE W GRAM STAIN (SURGICAL/DEEP WOUND): Special Requests: NORMAL

## 2023-03-24 LAB — CREATININE, SERUM
Creatinine, Ser: 0.89 mg/dL (ref 0.61–1.24)
GFR, Estimated: >60 mL/min (ref >60)

## 2023-03-24 MED ORDER — CLOTRIMAZOLE 1 % EX CREA
TOPICAL_CREAM | Freq: Two times a day (BID) | CUTANEOUS | Status: DC
  Administered 2023-03-24 – 2023-04-02 (×9): 1 via TOPICAL
  Filled 2023-03-24: qty 15

## 2023-03-24 MED ORDER — SODIUM CHLORIDE 0.9 % IV SOLN
3.0000 g | Freq: Four times a day (QID) | INTRAVENOUS | Status: AC
  Administered 2023-03-24 – 2023-03-25 (×5): 3 g via INTRAVENOUS
  Filled 2023-03-24 (×5): qty 8
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
## 2023-03-24 NOTE — TOC Progression Note (Signed)
Transition of Care [facility]) - Progression Note    Patient Details  Name: [name] MRN: [id number] Date of Birth: 02-25-1956  Transition of Care [facility] CM/SW Contact  Astrid Drafts Berna Spare, RN Phone Number: 03/24/2023, 2:03pm  Clinical Narrative:    [facility] renewal completed today.  Case number [id number].  Nonemergent law enforcement notified for IVC service only.   Expected Discharge Plan: Psychiatric Hospital Barriers to Discharge: Continued Medical Work up  Expected Discharge Plan and Services   Discharge Planning Services: CM Consult   Living arrangements for the past 2 months: Homeless                                       Social Determinants of Health (SDOH) Interventions SDOH Screenings   Tobacco Use: High Risk (02/24/2023)    Readmission Risk Interventions     No data to display         Quintella Baton, RN, BSN  Trauma/Neuro ICU Case Manager [phone]
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
## 2023-03-26 DIAGNOSIS — T148XXA Other injury of unspecified body region, initial encounter: Secondary | ICD-10-CM | POA: Diagnosis not present

## 2023-03-26 DIAGNOSIS — T1491XA Suicide attempt, initial encounter: Secondary | ICD-10-CM | POA: Diagnosis not present
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
## 2023-03-27 ENCOUNTER — Inpatient Hospital Stay (HOSPITAL_COMMUNITY): Payer: Medicare HMO

## 2023-03-27 DIAGNOSIS — T148XXA Other injury of unspecified body region, initial encounter: Secondary | ICD-10-CM | POA: Diagnosis not present

## 2023-03-27 MED ORDER — POLYETHYLENE GLYCOL 3350 17 G PO PACK
17.0000 g | PACK | Freq: Two times a day (BID) | ORAL | Status: DC
  Administered 2023-03-27 – 2023-04-02 (×12): 17 g
  Filled 2023-03-27 (×16): qty 1

## 2023-03-27 MED ORDER — IOHEXOL 350 MG/ML SOLN
75.0000 mL | Freq: Once | INTRAVENOUS | Status: AC | PRN
  Administered 2023-03-27: 75 mL via INTRAVENOUS

## 2023-03-27 MED ORDER — IOHEXOL 9 MG/ML PO SOLN
500.0000 mL | ORAL | Status: AC
  Administered 2023-03-27 (×2): 500 mL via ORAL
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
## 2023-03-27 NOTE — Progress Notes (Signed)
PT Cancellation Note  Patient Details Name: Woodie Degraffenreid MRN: [id number] DOB: 1956-05-15   Cancelled Treatment:    Reason Eval/Treat Not Completed: Other (comment)  Declining getting up and walking at this time; states he'll walk with the sitter in his room later;   Van Clines, PT  Acute Rehabilitation Services Office [phone] Secure Chat welcomed    Levi Aland 03/27/2023, 2:30 PM

## 2023-03-27 NOTE — TOC Progression Note (Signed)
Transition of Care [facility]) - Progression Note    Patient Details  Name: [name] MRN: [id number] Date of Birth: 06-24-1956  Transition of Care [facility]) CM/SW Contact  Astrid Drafts Berna Spare, RN Phone Number: 03/27/2023, 12:38pm  Clinical Narrative:    Received call from [facility] intake; patient currently remains on waiting list for possible admission to facility.   Expected Discharge Plan: Psychiatric Hospital Barriers to Discharge: Continued Medical Work up  Expected Discharge Plan and Services   Discharge Planning Services: CM Consult   Living arrangements for the past 2 months: Homeless                                       Social Determinants of Health (SDOH) Interventions SDOH Screenings   Tobacco Use: High Risk (02/24/2023)    Readmission Risk Interventions     No data to display         Quintella Baton, RN, BSN  Trauma/Neuro ICU Case Manager [phone]
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
## 2023-03-28 ENCOUNTER — Inpatient Hospital Stay (HOSPITAL_COMMUNITY): Payer: Medicare HMO

## 2023-03-28 HISTORY — PX: IR SINUS/FIST TUBE CHK-NON GI: IMG673

## 2023-03-28 MED ORDER — IOHEXOL 300 MG/ML  SOLN
50.0000 mL | Freq: Once | INTRAMUSCULAR | Status: AC | PRN
  Administered 2023-03-28: 10 mL

## 2023-03-28 NOTE — Progress Notes (Signed)
  Patient is under involuntary commitment and is unable to consent for himself. I recommend proceeding with drain injection and possible removal by IR. This plan of care was discussed with my attending Dr. Bedelia Person.   Hosie Spangle, PA-C [facility] Please see Amion for pager number during day hours 7:00am-4:30pm
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
## 2023-03-29 DIAGNOSIS — T148XXA Other injury of unspecified body region, initial encounter: Secondary | ICD-10-CM | POA: Diagnosis not present

## 2023-03-29 MED ORDER — OXYCODONE HCL 5 MG PO TABS
5.0000 mg | ORAL_TABLET | ORAL | Status: DC | PRN
  Administered 2023-03-29 – 2023-04-02 (×12): 10 mg via ORAL
  Filled 2023-03-29 (×12): qty 2

## 2023-03-29 MED ORDER — OXCARBAZEPINE 150 MG PO TABS
150.0000 mg | ORAL_TABLET | Freq: Two times a day (BID) | ORAL | Status: DC
  Administered 2023-03-29 (×2): 150 mg via ORAL
  Filled 2023-03-29 (×5): qty 1
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
## 2023-03-29 NOTE — Plan of Care (Signed)
  Problem: Pain Managment: Goal: General experience of comfort will improve Outcome: Progressing   
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
## 2023-03-30 MED ORDER — MAGNESIUM CITRATE PO SOLN
1.0000 | Freq: Once | ORAL | Status: AC
  Administered 2023-03-30: 1 via ORAL
  Filled 2023-03-30: qty 296

## 2023-03-30 NOTE — Discharge Instructions (Signed)
[facility]      [facility] [phone]  OPEN ABDOMINAL SURGERY: POST OP INSTRUCTIONS  Always review your discharge instruction sheet given to you by the facility where your surgery was performed.  IF YOU HAVE DISABILITY OR FAMILY LEAVE FORMS, YOU MUST BRING THEM TO THE OFFICE FOR PROCESSING.  PLEASE DO NOT GIVE THEM TO YOUR DOCTOR.  A prescription for pain medication may be given to you upon discharge.  Take your pain medication as prescribed, if needed.  If narcotic pain medicine is not needed, then you may take acetaminophen (Tylenol) or ibuprofen (Advil) as needed. Take your usually prescribed medications unless otherwise directed. If you need a refill on your pain medication, please contact your pharmacy. They will contact our office to request authorization.  Prescriptions will not be filled after 5pm or on week-ends. You should follow a light diet the first few days after arrival home, such as soup and crackers, pudding, etc.unless your doctor has advised otherwise. A high-fiber, low fat diet can be resumed as tolerated.   Be sure to include lots of fluids daily. Most patients will experience some swelling and bruising on the chest and neck area.  Ice packs will help.  Swelling and bruising can take several days to resolve Most patients will experience some swelling and bruising in the area of the incision. Ice pack will help. Swelling and bruising can take several days to resolve..  It is common to experience some constipation if taking pain medication after surgery.  Increasing fluid intake and taking a stool softener will usually help or prevent this problem from occurring.  A mild laxative (Milk of Magnesia or Miralax) should be taken according to package directions if there are no bowel movements after 48 hours.  You may have steri-strips (small skin tapes) in place directly over the incision.  These strips should be left on the skin for 7-10 days.  If your surgeon used skin  glue on the incision, you may shower in 24 hours.  The glue will flake off over the next 2-3 weeks.  Any sutures or staples will be removed at the office during your follow-up visit. You may find that a light gauze bandage over your incision may keep your staples from being rubbed or pulled. You may shower and replace the bandage daily. ACTIVITIES:  You may resume regular (light) daily activities beginning the next day--such as daily self-care, walking, climbing stairs--gradually increasing activities as tolerated.  You may have sexual intercourse when it is comfortable.  Refrain from any heavy lifting or straining until approved by your doctor. You may drive when you no longer are taking prescription pain medication, you can comfortably wear a seatbelt, and you can safely maneuver your car and apply brakes Return to Work: ___________________________________ You should see your doctor in the office for a follow-up appointment approximately two weeks after your surgery.  Make sure that you call for this appointment within a day or two after you arrive home to insure a convenient appointment time. OTHER INSTRUCTIONS:  _____________________________________________________________ _____________________________________________________________  WHEN TO CALL YOUR DOCTOR: Fever over 101.0 Inability to urinate Nausea and/or vomiting Extreme swelling or bruising Continued bleeding from incision. Increased pain, redness, or drainage from the incision. Difficulty swallowing or breathing Muscle cramping or spasms. Numbness or tingling in hands or feet or around lips.  The clinic staff is available to answer your questions during regular business hours.  Please don't hesitate to call and ask to speak to one of   the nurses if you have concerns.  For further questions, please visit [email or web address]  

## 2023-03-30 NOTE — Plan of Care (Signed)
  Problem: Pain Managment: Goal: General experience of comfort will improve Outcome: Progressing   

## 2023-03-30 NOTE — Progress Notes (Signed)
Pt noted to be very restless, irritable and pt states "I'm miserable give me something to have a BM". Pt given his prn and prune juice x3 but ineffective. BS x4 but abd assessed to be distended. MD notified and new order received. Will closely monitor pt. Dionne Bucy RN

## 2023-03-30 NOTE — Progress Notes (Signed)
PT Cancellation Note  Patient Details Name: [name] MRN: [id number] DOB: [birth year]   Cancelled Treatment:    Reason Eval/Treat Not Completed: Pain limiting ability to participate; patient reports constipated and did not want to work with PT.  Noted earlier today sitter assisting pt to walk in the hallway.    [name] 03/30/2023, 4:13 PM Sheran Lawless, PT Acute Rehabilitation Services Office:[phone] 03/30/2023
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
## 2023-03-31 DIAGNOSIS — T148XXA Other injury of unspecified body region, initial encounter: Secondary | ICD-10-CM | POA: Diagnosis not present

## 2023-03-31 LAB — CREATININE, SERUM
Creatinine, Ser: 0.83 mg/dL (ref 0.61–1.24)
GFR, Estimated: >60 mL/min (ref >60)

## 2023-03-31 MED ORDER — QUETIAPINE FUMARATE ER 200 MG PO TB24
200.0000 mg | ORAL_TABLET | Freq: Every day | ORAL | Status: DC
  Administered 2023-03-31 – 2023-04-02 (×3): 200 mg via ORAL
  Filled 2023-03-31 (×4): qty 1

## 2023-03-31 MED ORDER — GABAPENTIN 300 MG PO CAPS
300.0000 mg | ORAL_CAPSULE | Freq: Three times a day (TID) | ORAL | Status: DC
  Administered 2023-03-31 – 2023-04-03 (×10): 300 mg via ORAL
  Filled 2023-03-31 (×10): qty 1

## 2023-03-31 MED ORDER — OXCARBAZEPINE 300 MG PO TABS: 300.0000 mg | ORAL_TABLET | Freq: Two times a day (BID) | ORAL | Status: DC
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
## 2023-03-31 NOTE — Progress Notes (Signed)
Pt is to be on tele-sitter monitor for observation but no tele-sitter monitor delivered to pt's room. RN called staffing and Claris Gladden from staffing office informed RN pt is on the waiting list as no tele-monitor was available presently. Staffing office will bring a tele-monitor once one become available. NP Fredna Dow and [facility] aware and NP ok with frequent check on pt. Will continue to closely monitor pt and frequently assess. Dionne Bucy RN
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
## 2023-03-31 NOTE — TOC Progression Note (Signed)
Transition of Care [facility]) - Progression Note    Patient Details  Name: [name] MRN: [id number] Date of Birth: 03-22-1956  [facility] CM/SW Contact  Glennon Mac, RN Phone Number: 03/31/2023, 4:53 PM  Clinical Narrative:    IVC due to be renewed today.  Spoke with Psych NP, Fredna Dow; she states that patient will likely be discharged on Monday, and be cleared from a psychiatric standpoint.Plan to dc suicide precautions and order tele sitter, per psych.     Expected Discharge Plan: Psychiatric Hospital Barriers to Discharge: Continued Medical Work up  Expected Discharge Plan and Services   Discharge Planning Services: CM Consult   Living arrangements for the past 2 months: Homeless                                       Social Determinants of Health (SDOH) Interventions SDOH Screenings   Tobacco Use: High Risk (03/28/2023)    Readmission Risk Interventions     No data to display         Quintella Baton, RN, BSN  Trauma/Neuro ICU Case Manager [phone]

## 2023-04-01 DIAGNOSIS — T1491XA Suicide attempt, initial encounter: Secondary | ICD-10-CM | POA: Diagnosis not present

## 2023-04-01 DIAGNOSIS — T148XXA Other injury of unspecified body region, initial encounter: Secondary | ICD-10-CM | POA: Diagnosis not present
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
## 2023-04-03 ENCOUNTER — Other Ambulatory Visit (HOSPITAL_COMMUNITY): Payer: Self-pay

## 2023-04-03 DIAGNOSIS — T148XXA Other injury of unspecified body region, initial encounter: Secondary | ICD-10-CM | POA: Diagnosis not present

## 2023-04-03 MED ORDER — CLONIDINE HCL 0.1 MG PO TABS
0.1000 mg | ORAL_TABLET | Freq: Two times a day (BID) | ORAL | 11 refills | Status: DC
  Filled 2023-04-03: qty 60, 30d supply, fill #0

## 2023-04-03 MED ORDER — OXYCODONE HCL 5 MG PO TABS
5.0000 mg | ORAL_TABLET | Freq: Three times a day (TID) | ORAL | 0 refills | Status: DC | PRN
  Filled 2023-04-03: qty 15, 5d supply, fill #0

## 2023-04-03 MED ORDER — QUETIAPINE FUMARATE ER 200 MG PO TB24
200.0000 mg | ORAL_TABLET | Freq: Every day | ORAL | 1 refills | Status: DC
  Filled 2023-04-03: qty 30, 30d supply, fill #0
  Filled 2023-04-03: qty 360, 90d supply, fill #0

## 2023-04-03 MED ORDER — GABAPENTIN 300 MG PO CAPS
300.0000 mg | ORAL_CAPSULE | Freq: Three times a day (TID) | ORAL | 1 refills | Status: DC
  Filled 2023-04-03: qty 90, 30d supply, fill #0

## 2023-04-03 NOTE — Progress Notes (Signed)
Pt discharge education and instructions completed with pt. Pt voices understanding and denies any questions. Pt TOC medications picked up from pharmacy and handed to pt at bedside. Pt discharged and transported off unit via wheelchair by SWOT RN. Pt transported off with his belongings to the side. Arabella Merles Pavel Gadd RN.

## 2023-04-03 NOTE — TOC Transition Note (Signed)
Transition of Care [facility]) - CM/SW Discharge Note   Patient Details  Name: Gage Yildiz MRN: [id number] Date of Birth: 18-Nov-1955  Transition of Care [facility] CM/SW Contact:  Glennon Mac, RN Phone Number: 04/03/2023, 4:28 PM   Clinical Narrative:    Patient medically stable for discharge today, per attending.  He has been cleared from a psych standpoint, and no longer needs inpatient admission.  Patient provided with clothes and bus passes, as well as psych resources.  He is appreciative of help given.     Final next level of care: Home/Self Care Barriers to Discharge: Barriers Resolved                       Discharge Plan and Services Additional resources added to the After Visit Summary for     Discharge Planning Services: CM Consult                                 Social Determinants of Health (SDOH) Interventions SDOH Screenings   Tobacco Use: High Risk (03/28/2023)     Readmission Risk Interventions     No data to display         Quintella Baton, RN, BSN  Trauma/Neuro ICU Case Manager [phone]
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
## 2023-04-03 NOTE — Care Management Important Message (Signed)
Important Message  Patient Details  Name: [name] MRN: [id number] Date of Birth: 13-Jun-1956   Medicare Important Message Given:  Yes     Sherilyn Banker 04/03/2023, 4:08 PM

## 2023-04-06 ENCOUNTER — Encounter (HOSPITAL_COMMUNITY): Payer: Self-pay | Admitting: Emergency Medicine

## 2023-04-23 ENCOUNTER — Other Ambulatory Visit: Payer: Self-pay

## 2023-04-23 ENCOUNTER — Emergency Department (HOSPITAL_COMMUNITY)
Admission: EM | Admit: 2023-04-23 | Discharge: 2023-04-23 | Disposition: A | Discharge status: Home / Self Care | Payer: Medicare HMO | Attending: Emergency Medicine | Admitting: Emergency Medicine

## 2023-04-23 ENCOUNTER — Encounter (HOSPITAL_COMMUNITY): Payer: Self-pay

## 2023-04-23 DIAGNOSIS — R1013 Epigastric pain: Secondary | ICD-10-CM | POA: Diagnosis not present

## 2023-04-23 DIAGNOSIS — E871 Hypo-osmolality and hyponatremia: Secondary | ICD-10-CM | POA: Insufficient documentation

## 2023-04-23 DIAGNOSIS — R066 Hiccough: Secondary | ICD-10-CM | POA: Diagnosis not present

## 2023-04-23 LAB — COMPREHENSIVE METABOLIC PANEL WITH GFR
ALT: 38 U/L (ref 0–44)
AST: 48 U/L — ABNORMAL HIGH (ref 15–41)
Albumin: 3.6 g/dL (ref 3.5–5.0)
Alkaline Phosphatase: 83 U/L (ref 38–126)
Anion gap: 14 (ref 5–15)
BUN: <5 mg/dL — ABNORMAL LOW (ref 8–23)
CO2: 23 mmol/L (ref 22–32)
Calcium: 9 mg/dL (ref 8.9–10.3)
Chloride: 93 mmol/L — ABNORMAL LOW (ref 98–111)
Creatinine, Ser: 0.64 mg/dL (ref 0.61–1.24)
GFR, Estimated: >60 mL/min
Glucose, Bld: 102 mg/dL — ABNORMAL HIGH (ref 70–99)
Potassium: 3.4 mmol/L — ABNORMAL LOW (ref 3.5–5.1)
Sodium: 130 mmol/L — ABNORMAL LOW (ref 135–145)
Total Bilirubin: 1.9 mg/dL — ABNORMAL HIGH (ref 0.3–1.2)
Total Protein: 8.8 g/dL — ABNORMAL HIGH (ref 6.5–8.1)

## 2023-04-23 LAB — CBC WITH DIFFERENTIAL/PLATELET
Abs Immature Granulocytes: 0.03 10*3/uL (ref 0.00–0.07)
Basophils Absolute: 0.1 10*3/uL (ref 0.0–0.1)
Basophils Relative: 1 %
Eosinophils Absolute: 0.1 10*3/uL (ref 0.0–0.5)
Eosinophils Relative: 1 %
HCT: 36.1 % — ABNORMAL LOW (ref 39.0–52.0)
Hemoglobin: 11.5 g/dL — ABNORMAL LOW (ref 13.0–17.0)
Immature Granulocytes: 0 %
Lymphocytes Relative: 23 %
Lymphs Abs: 1.7 10*3/uL (ref 0.7–4.0)
MCH: 24.2 pg — ABNORMAL LOW (ref 26.0–34.0)
MCHC: 31.9 g/dL (ref 30.0–36.0)
MCV: 76 fL — ABNORMAL LOW (ref 80.0–100.0)
Monocytes Absolute: 0.7 10*3/uL (ref 0.1–1.0)
Monocytes Relative: 9 %
Neutro Abs: 5 10*3/uL (ref 1.7–7.7)
Neutrophils Relative %: 66 %
Platelets: 174 10*3/uL (ref 150–400)
RBC: 4.75 MIL/uL (ref 4.22–5.81)
RDW: 18.6 % — ABNORMAL HIGH (ref 11.5–15.5)
WBC: 7.7 10*3/uL (ref 4.0–10.5)
nRBC: 0 % (ref 0.0–0.2)

## 2023-04-23 LAB — LIPASE, BLOOD: Lipase: 61 U/L — ABNORMAL HIGH (ref 11–51)

## 2023-04-23 LAB — TROPONIN I (HIGH SENSITIVITY)
Troponin I (High Sensitivity): 7 ng/L
Troponin I (High Sensitivity): 7 ng/L (ref <18)

## 2023-04-23 MED ORDER — ALUM & MAG HYDROXIDE-SIMETH 200-200-20 MG/5ML PO SUSP
30.0000 mL | Freq: Once | ORAL | Status: AC
  Administered 2023-04-23: 30 mL via ORAL
  Filled 2023-04-23: qty 30

## 2023-04-23 MED ORDER — OMEPRAZOLE 20 MG PO CPDR: 20.0000 mg | DELAYED_RELEASE_CAPSULE | Freq: Every day | ORAL | 0 refills | Status: DC

## 2023-04-23 MED ORDER — CHLORPROMAZINE HCL 25 MG PO TABS
25.0000 mg | ORAL_TABLET | Freq: Once | ORAL | Status: AC
  Administered 2023-04-23: 25 mg via ORAL
  Filled 2023-04-23: qty 1

## 2023-04-23 MED ORDER — SUCRALFATE 1 G PO TABS: 1.0000 g | ORAL_TABLET | Freq: Three times a day (TID) | ORAL | 0 refills | Status: DC

## 2023-04-23 NOTE — ED Triage Notes (Signed)
Patient BIB EMS with c/o liver and esophagus pain. Patient states this has been going on for months. Patient drinks a 5th of alcohol a day with his last drink being at 0100. Patient is A&Ox4.

## 2023-04-23 NOTE — Discharge Instructions (Addendum)
You should decrease your alcohol intake.  Take medications as prescribed.

## 2023-04-23 NOTE — ED Provider Notes (Signed)
[facility]EMERGENCY DEPT [facility] Emergency Department Provider Note MRN:  [id number]  Arrival date & time: 04/23/23     Chief Complaint   Liver and esophagus pain   History of Present Illness   [name] is a 67 y.o. year-old male presents to the ED with chief complaint of GERD and upper abdominal pain.  States that his esophagus and liver hurt.  Reports also having the hiccups.  States that he continues to drink heavily.  Drinks a 5th of alcohol per day.  Last drink was an hour PTA.  Denies fever or chills.  History provided by patient.   Review of Systems  Pertinent positive and negative review of systems noted in HPI.    Physical Exam   Vitals:   04/23/23 0230 04/23/23 0245  BP: (!) 181/82 (!) 166/77  Pulse: 90 86  Resp: 19 15  Temp:    SpO2: 98% 96%    CONSTITUTIONAL:  chronically ill-appearing, NAD NEURO:  Alert and oriented x 3, CN 3-12 grossly intact EYES:  eyes equal and reactive ENT/NECK:  Supple, no stridor  CARDIO:  normal rate, regular rhythm, appears well-perfused  PULM:  No respiratory distress, CTAB GI/GU:  non-distended, no focal tenderness MSK/SPINE:  No gross deformities, no edema, moves all extremities  SKIN:  no rash, atraumatic   *Additional and/or pertinent findings included in MDM below  Diagnostic and Interventional Summary    EKG Interpretation Date/Time:    Ventricular Rate:    PR Interval:    QRS Duration:    QT Interval:    QTC Calculation:   R Axis:      Text Interpretation:         Labs Reviewed  COMPREHENSIVE METABOLIC PANEL - Abnormal; Notable for the following components:      Result Value   Sodium 130 (*)    Potassium 3.4 (*)    Chloride 93 (*)    Glucose, Bld 102 (*)    BUN <5 (*)    Total Protein 8.8 (*)    AST 48 (*)    Total Bilirubin 1.9 (*)    All other components within normal limits  LIPASE, BLOOD - Abnormal; Notable for the following components:   Lipase 61 (*)    All other components within  normal limits  CBC WITH DIFFERENTIAL/PLATELET - Abnormal; Notable for the following components:   Hemoglobin 11.5 (*)    HCT 36.1 (*)    MCV 76.0 (*)    MCH 24.2 (*)    RDW 18.6 (*)    All other components within normal limits  TROPONIN I (HIGH SENSITIVITY)  TROPONIN I (HIGH SENSITIVITY)    No orders to display    Medications  alum & mag hydroxide-simeth (MAALOX/MYLANTA) 200-200-20 MG/5ML suspension 30 mL (30 mLs Oral Given 04/23/23 0242)  chlorproMAZINE (THORAZINE) tablet 25 mg (25 mg Oral Given 04/23/23 0241)     Procedures  /  Critical Care Procedures  ED Course and Medical Decision Making  I have reviewed the triage vital signs, the nursing notes, and pertinent available records from the EMR.  Social Determinants Affecting Complexity of Care: Patient suffers from substance abuse.   ED Course:    Medical Decision Making Patient here with upper abdominal pain.  Hx of significant alcohol use.  Last drink was about an hour prior to arrival.    Labs are fairly reassuring.  Mildly hyponatremic, likely beer drinker's hyponatremia.    Trops are flat, doubt ACS.  Hiccups resolved with  thorazine.   Recommend PCP follow-up.  Amount and/or Complexity of Data Reviewed Labs: ordered.  Risk OTC drugs. Prescription drug management.         Consultants: No consultations were needed in caring for this patient.   Treatment and Plan: Emergency department workup does not suggest an emergent condition requiring admission or immediate intervention beyond  what has been performed at this time. The patient is safe for discharge and has  been instructed to return immediately for worsening symptoms, change in  symptoms or any other concerns    Final Clinical Impressions(s) / ED Diagnoses     ICD-10-CM   1. Epigastric pain  R10.13     2. Hiccups  R06.6       ED Discharge Orders          Ordered    sucralfate (CARAFATE) 1 g tablet  3 times daily with meals & bedtime         04/23/23 0524    omeprazole (PRILOSEC) 20 MG capsule  Daily        04/23/23 0524              Discharge Instructions Discussed with and Provided to Patient:     Discharge Instructions      You should decrease your alcohol intake.  Take medications as prescribed.       Roxy Horseman, PA-C 04/23/23 0527    Melene Plan, DO 04/23/23 [phone]

## 2023-05-06 ENCOUNTER — Emergency Department (HOSPITAL_COMMUNITY)
Admission: EM | Admit: 2023-05-06 | Discharge: 2023-05-06 | Disposition: A | Discharge status: Other Acute Inpatient Hospital | Payer: Medicare HMO | Attending: Emergency Medicine | Admitting: Emergency Medicine

## 2023-05-06 ENCOUNTER — Other Ambulatory Visit: Payer: Self-pay

## 2023-05-06 ENCOUNTER — Ambulatory Visit (INDEPENDENT_AMBULATORY_CARE_PROVIDER_SITE_OTHER)
Admission: EM | Admit: 2023-05-06 | Discharge: 2023-05-06 | Disposition: A | Discharge status: Other Acute Inpatient Hospital | Payer: Medicare HMO | Source: Home / Self Care

## 2023-05-06 ENCOUNTER — Emergency Department (HOSPITAL_COMMUNITY): Payer: Medicare HMO

## 2023-05-06 ENCOUNTER — Encounter (HOSPITAL_COMMUNITY): Payer: Self-pay

## 2023-05-06 DIAGNOSIS — I10 Essential (primary) hypertension: Secondary | ICD-10-CM | POA: Insufficient documentation

## 2023-05-06 DIAGNOSIS — Z91199 Patient's noncompliance with other medical treatment and regimen due to unspecified reason: Secondary | ICD-10-CM | POA: Diagnosis not present

## 2023-05-06 DIAGNOSIS — F1010 Alcohol abuse, uncomplicated: Secondary | ICD-10-CM | POA: Diagnosis not present

## 2023-05-06 DIAGNOSIS — B1920 Unspecified viral hepatitis C without hepatic coma: Secondary | ICD-10-CM | POA: Insufficient documentation

## 2023-05-06 DIAGNOSIS — F1020 Alcohol dependence, uncomplicated: Secondary | ICD-10-CM | POA: Insufficient documentation

## 2023-05-06 DIAGNOSIS — D649 Anemia, unspecified: Secondary | ICD-10-CM | POA: Insufficient documentation

## 2023-05-06 DIAGNOSIS — K2210 Ulcer of esophagus without bleeding: Secondary | ICD-10-CM | POA: Diagnosis not present

## 2023-05-06 DIAGNOSIS — Z560 Unemployment, unspecified: Secondary | ICD-10-CM | POA: Diagnosis not present

## 2023-05-06 DIAGNOSIS — R0602 Shortness of breath: Secondary | ICD-10-CM | POA: Diagnosis not present

## 2023-05-06 DIAGNOSIS — F32A Depression, unspecified: Secondary | ICD-10-CM | POA: Insufficient documentation

## 2023-05-06 DIAGNOSIS — F17210 Nicotine dependence, cigarettes, uncomplicated: Secondary | ICD-10-CM | POA: Insufficient documentation

## 2023-05-06 DIAGNOSIS — F601 Schizoid personality disorder: Secondary | ICD-10-CM | POA: Diagnosis not present

## 2023-05-06 DIAGNOSIS — R569 Unspecified convulsions: Secondary | ICD-10-CM | POA: Insufficient documentation

## 2023-05-06 DIAGNOSIS — Z8673 Personal history of transient ischemic attack (TIA), and cerebral infarction without residual deficits: Secondary | ICD-10-CM | POA: Insufficient documentation

## 2023-05-06 DIAGNOSIS — R45851 Suicidal ideations: Secondary | ICD-10-CM | POA: Insufficient documentation

## 2023-05-06 DIAGNOSIS — Z9151 Personal history of suicidal behavior: Secondary | ICD-10-CM | POA: Diagnosis not present

## 2023-05-06 DIAGNOSIS — Z5900 Homelessness unspecified: Secondary | ICD-10-CM | POA: Insufficient documentation

## 2023-05-06 DIAGNOSIS — R531 Weakness: Secondary | ICD-10-CM | POA: Insufficient documentation

## 2023-05-06 DIAGNOSIS — N390 Urinary tract infection, site not specified: Secondary | ICD-10-CM | POA: Diagnosis not present

## 2023-05-06 DIAGNOSIS — F1024 Alcohol dependence with alcohol-induced mood disorder: Secondary | ICD-10-CM | POA: Diagnosis not present

## 2023-05-06 LAB — POCT URINE DRUG SCREEN - MANUAL ENTRY (I-SCREEN)
POC Amphetamine UR: NOT DETECTED
POC Buprenorphine (BUP): NOT DETECTED
POC Cocaine UR: NOT DETECTED
POC Marijuana UR: NOT DETECTED
POC Methadone UR: NOT DETECTED
POC Methamphetamine UR: NOT DETECTED
POC Morphine: NOT DETECTED
POC Oxazepam (BZO): NOT DETECTED
POC Oxycodone UR: NOT DETECTED
POC Secobarbital (BAR): NOT DETECTED

## 2023-05-06 LAB — CBC WITH DIFFERENTIAL/PLATELET
Abs Immature Granulocytes: 0.01 10*3/uL (ref 0.00–0.07)
Basophils Absolute: 0.1 10*3/uL (ref 0.0–0.1)
Basophils Relative: 1 %
Eosinophils Absolute: 0.1 10*3/uL (ref 0.0–0.5)
Eosinophils Relative: 1 %
HCT: 33.2 % — ABNORMAL LOW (ref 39.0–52.0)
Hemoglobin: 10.6 g/dL — ABNORMAL LOW (ref 13.0–17.0)
Immature Granulocytes: 0 %
Lymphocytes Relative: 29 %
Lymphs Abs: 1.4 10*3/uL (ref 0.7–4.0)
MCH: 24.8 pg — ABNORMAL LOW (ref 26.0–34.0)
MCHC: 31.9 g/dL (ref 30.0–36.0)
MCV: 77.6 fL — ABNORMAL LOW (ref 80.0–100.0)
Monocytes Absolute: 0.6 10*3/uL (ref 0.1–1.0)
Monocytes Relative: 12 %
Neutro Abs: 2.7 10*3/uL (ref 1.7–7.7)
Neutrophils Relative %: 57 %
Platelets: 106 10*3/uL — ABNORMAL LOW (ref 150–400)
RBC: 4.28 MIL/uL (ref 4.22–5.81)
RDW: 17.9 % — ABNORMAL HIGH (ref 11.5–15.5)
WBC: 4.8 10*3/uL (ref 4.0–10.5)
nRBC: 0 % (ref 0.0–0.2)

## 2023-05-06 LAB — COMPREHENSIVE METABOLIC PANEL
ALT: 26 U/L (ref 0–44)
AST: 43 U/L — ABNORMAL HIGH (ref 15–41)
Albumin: 3.2 g/dL — ABNORMAL LOW (ref 3.5–5.0)
Alkaline Phosphatase: 75 U/L (ref 38–126)
Anion gap: 12 (ref 5–15)
BUN: 8 mg/dL (ref 8–23)
CO2: 22 mmol/L (ref 22–32)
Calcium: 8.8 mg/dL — ABNORMAL LOW (ref 8.9–10.3)
Chloride: 101 mmol/L (ref 98–111)
Creatinine, Ser: 0.7 mg/dL (ref 0.61–1.24)
GFR, Estimated: >60 mL/min (ref >60)
Glucose, Bld: 89 mg/dL (ref 70–99)
Potassium: 3.4 mmol/L — ABNORMAL LOW (ref 3.5–5.1)
Sodium: 135 mmol/L (ref 135–145)
Total Bilirubin: 1.6 mg/dL — ABNORMAL HIGH (ref 0.3–1.2)
Total Protein: 7.6 g/dL (ref 6.5–8.1)

## 2023-05-06 LAB — ACETAMINOPHEN LEVEL: Acetaminophen (Tylenol), Serum: <10 ug/mL — ABNORMAL LOW (ref 10–30)

## 2023-05-06 LAB — URINALYSIS, ROUTINE W REFLEX MICROSCOPIC
Bilirubin Urine: NEGATIVE
Glucose, UA: NEGATIVE mg/dL
Ketones, ur: NEGATIVE mg/dL
Nitrite: NEGATIVE
Protein, ur: NEGATIVE mg/dL
Specific Gravity, Urine: 1.002 — ABNORMAL LOW (ref 1.005–1.030)
pH: 5 (ref 5.0–8.0)

## 2023-05-06 LAB — RAPID URINE DRUG SCREEN, HOSP PERFORMED
Amphetamines: NOT DETECTED
Barbiturates: NOT DETECTED
Benzodiazepines: NOT DETECTED
Cocaine: NOT DETECTED
Opiates: NOT DETECTED
Tetrahydrocannabinol: NOT DETECTED

## 2023-05-06 LAB — D-DIMER, QUANTITATIVE: D-Dimer, Quant: 0.59 ug/mL-FEU — ABNORMAL HIGH (ref 0.00–0.50)

## 2023-05-06 LAB — SALICYLATE LEVEL: Salicylate Lvl: <7 mg/dL — ABNORMAL LOW (ref 7.0–30.0)

## 2023-05-06 LAB — TROPONIN I (HIGH SENSITIVITY): Troponin I (High Sensitivity): 6 ng/L (ref <18)

## 2023-05-06 LAB — ETHANOL: Alcohol, Ethyl (B): 134 mg/dL — ABNORMAL HIGH (ref <10)

## 2023-05-06 MED ORDER — THIAMINE HCL 100 MG/ML IJ SOLN
100.0000 mg | Freq: Every day | INTRAMUSCULAR | Status: DC
  Filled 2023-05-06: qty 2

## 2023-05-06 MED ORDER — LOPERAMIDE HCL 2 MG PO CAPS: 2.0000 mg | ORAL_CAPSULE | ORAL | Status: DC | PRN

## 2023-05-06 MED ORDER — LORAZEPAM 1 MG PO TABS
1.0000 mg | ORAL_TABLET | Freq: Four times a day (QID) | ORAL | Status: DC | PRN
  Administered 2023-05-06: 1 mg via ORAL
  Filled 2023-05-06: qty 1

## 2023-05-06 MED ORDER — CLONIDINE HCL 0.1 MG PO TABS
ORAL_TABLET | ORAL | Status: AC
  Administered 2023-05-06: 0.1 mg via ORAL
  Filled 2023-05-06: qty 1

## 2023-05-06 MED ORDER — ONDANSETRON 4 MG PO TBDP: 4.0000 mg | ORAL_TABLET | Freq: Four times a day (QID) | ORAL | Status: DC | PRN

## 2023-05-06 MED ORDER — POTASSIUM CHLORIDE CRYS ER 20 MEQ PO TBCR
40.0000 meq | EXTENDED_RELEASE_TABLET | Freq: Once | ORAL | Status: AC
  Administered 2023-05-06: 40 meq via ORAL
  Filled 2023-05-06: qty 2

## 2023-05-06 MED ORDER — ACETAMINOPHEN 325 MG PO TABS
650.0000 mg | ORAL_TABLET | Freq: Four times a day (QID) | ORAL | Status: DC | PRN
  Administered 2023-05-06: 650 mg via ORAL
  Filled 2023-05-06: qty 2

## 2023-05-06 MED ORDER — HYDROXYZINE HCL 25 MG PO TABS: 25.0000 mg | ORAL_TABLET | Freq: Four times a day (QID) | ORAL | Status: DC | PRN

## 2023-05-06 MED ORDER — THIAMINE HCL 100 MG/ML IJ SOLN
100.0000 mg | Freq: Once | INTRAMUSCULAR | Status: AC
  Administered 2023-05-06: 100 mg via INTRAMUSCULAR
  Filled 2023-05-06: qty 2

## 2023-05-06 MED ORDER — LORAZEPAM 2 MG/ML IJ SOLN: 1.0000 mg | INTRAMUSCULAR | Status: DC | PRN

## 2023-05-06 MED ORDER — TRAZODONE HCL 50 MG PO TABS: 50.0000 mg | ORAL_TABLET | Freq: Every evening | ORAL | Status: DC | PRN

## 2023-05-06 MED ORDER — THIAMINE MONONITRATE 100 MG PO TABS
100.0000 mg | ORAL_TABLET | Freq: Every day | ORAL | Status: DC
  Administered 2023-05-06: 100 mg via ORAL
  Filled 2023-05-06: qty 1

## 2023-05-06 MED ORDER — CLONIDINE HCL 0.1 MG PO TABS: 0.1000 mg | ORAL_TABLET | Freq: Two times a day (BID) | ORAL | Status: DC

## 2023-05-06 MED ORDER — ADULT MULTIVITAMIN W/MINERALS CH
1.0000 | ORAL_TABLET | Freq: Every day | ORAL | Status: DC
  Administered 2023-05-06: 1 via ORAL
  Filled 2023-05-06: qty 1

## 2023-05-06 MED ORDER — ALUM & MAG HYDROXIDE-SIMETH 200-200-20 MG/5ML PO SUSP: 30.0000 mL | ORAL | Status: DC | PRN

## 2023-05-06 MED ORDER — LORAZEPAM 1 MG PO TABS
1.0000 mg | ORAL_TABLET | ORAL | Status: DC | PRN
  Administered 2023-05-06: 1 mg via ORAL
  Filled 2023-05-06: qty 1

## 2023-05-06 MED ORDER — SODIUM CHLORIDE 0.9 % IV SOLN
260.0000 mg | Freq: Once | INTRAVENOUS | Status: DC
  Filled 2023-05-06: qty 2

## 2023-05-06 MED ORDER — FOLIC ACID 1 MG PO TABS
1.0000 mg | ORAL_TABLET | Freq: Every day | ORAL | Status: DC
  Administered 2023-05-06: 1 mg via ORAL
  Filled 2023-05-06: qty 1

## 2023-05-06 MED ORDER — THIAMINE MONONITRATE 100 MG PO TABS: 100.0000 mg | ORAL_TABLET | Freq: Every day | ORAL | Status: DC

## 2023-05-06 MED ORDER — MAGNESIUM HYDROXIDE 400 MG/5ML PO SUSP: 30.0000 mL | Freq: Every day | ORAL | Status: DC | PRN
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
## 2023-05-06 NOTE — ED Notes (Signed)
Patient admitted to obs. Patient cooperative, A&Ox4 but presents with slurred speech, drooling, and drowsy. Patient also demonstrates unsteady gait and drooling with hypertension and hiccups. Patient was difficult to awaken after taking EKG, sternal rub done, and patient woke back up. Patient's EKG NSR along with VS taken and VS: 155/89, T: 97.4, HR: 90, R 16, O2: 98%. NP notified and EMS contacted but EMS then canceled per NP. Patient remains currently alert and provided with meal but claims "Im just drunk." Patient's last drink 2 hours ago. Unable to obtain alcohol level at this time since blood draw attempted x2 by different nursing staff with no success. Patient provided with water with continued monitoring per Nps orders.

## 2023-05-06 NOTE — ED Notes (Signed)
Safe transport called and on the way °

## 2023-05-06 NOTE — Progress Notes (Signed)
   05/06/23 1030  [place] Suicide Severity Rating Scale  1. Wish to be Dead Yes  2. Suicidal Thoughts No  6. Suicide Behavior Question Yes  7. How long ago did you do any of these? Over a year ago  C-SSRS RISK CATEGORY Low Risk  Patient location: [facility] Urgent Care/Facility Based Crisis Center  [facility]gent [facility] Suicide Precautions Interventions  [facility] Suicide Precautions Interventions Low Risk Interventions

## 2023-05-06 NOTE — ED Provider Notes (Signed)
[facility]  Medical Screening Exam Date: 05/06/23 Patient Name: [name] MRN: [id number] Chief Complaint: "I want to die, I just don't want to live"  Diagnoses:  Final diagnoses:  Alcohol abuse  Suicidal ideation    HPI: [name] is a 67 year old male presenting to [facility] voluntarily, complaining of  increased suicidal ideations, having an urge to overdose on Fentanyl. Patient reports that he has a problem of abusing alcohol and has not been able to control himself. Reports that "I have nothing", that he has lost everything due to alcohol abuse.  He reports that he uses about 10-12 beers on daily basis  and last use was right before he came to [facility]. Patient reports that not having support, being homeless, his girlfriend being in jail, are some of his emotional instabilities, leading to alcohol abuse. Patient reports he has been feeling depressed to the point of thinking about shooting himself or overdosing on Fentanyl. He reports a hx of overdosing and last attempt was in 2010 when he took a bunch of pills trying to die in sleep. Patient reports not taking medications for a while. He was suppose to receive ACTT services but "I didn't keep up with them".  He reports feeling hopeless and helpless and endorsing suicidal thoughts and intending to overdose on Fentanyl. Symptoms worsened when his girlfriend went to jail because he was staying with her in her car. Patient is now homeless and has access to substances every day. He reports not having services to help with both his mental/and physical health needs.   Assessment: [name] is a 67 year-old male who is sitting in the assessment room. He is disheveled, restless, anxious, tearful and shouts "I don't want to live, I want to die". Patient is alert and oriented x 4. He is somewhat guarded, not willing to provide detailed information. His thought process is organized and goal-oriented. His speech is pressured with drooling. He has  poor eye contact and is irritable.  Patient admits to endorsing suicidal ideations and planning to overdose on Fentanyl. He denies HI/AVH. He deos not appear to be responding to internal stimuli. Reports that his main problem is alcoholism and currently not having any services. Reports he has been off medications for a while and has not been seeing a PCP. Reports he is supposed to be receiving ACTT services but did not keep up with them and was discharged. Patient reports that his last alcohol intake was today prior to coming here. He reports that he is tired of living his current lifestyle and considering a long term treatment service. He reports that alcohol is the only substance he has been using.   Patient presents with symptoms including anxiety, restlessness, tremors, mild nausea, irritability and generalized weakness. He presents with a hx of HTN, depression, Hep C, anemia, hx of CVA, hx of seizures, and has not taken medications in a while. He presents with elevated BP. His EKG is normal. Patient reports poor appetite and insomnia. He refuses to discuss his current health status and states "I just need help with my addiction or I will just die, I have nothing to live for, I have lost everything".   Patient was admitted to observation unit  but his symptoms kept escalating as evidenced by chest discomfort, increased anxiety, elevated BP, weakness and tremors. He was transferred to [facility] ED for medical clearance as accepted by Dr Anitra Lauth.         Total Time spent  with patient: 45 minutes  Musculoskeletal  Strength & Muscle Tone:  Generalized weakness Gait & Station: shuffle Patient leans: N/A  Psychiatric Specialty Exam  Presentation General Appearance:  Disheveled (poor hygiene)  Eye Contact: Minimal  Speech: Pressured; Slow  Speech Volume: Normal  Handedness: Right   Mood and Affect  Mood: Anxious; Depressed; Hopeless; Irritable  Affect: Depressed;  Flat   Thought Process  Thought Processes: Coherent  Descriptions of Associations:Intact  Orientation:Full (Time, Place and Person)  Thought Content:Logical  Diagnosis of Schizophrenia or Schizoaffective disorder in past: No   Hallucinations:Hallucinations: None  Ideas of Reference:None  Suicidal Thoughts:Suicidal Thoughts: Yes, Passive SI Passive Intent and/or Plan: With Intent  Homicidal Thoughts:Homicidal Thoughts: No   Sensorium  Memory: Immediate Fair; Recent Fair; Remote Fair  Judgment: Fair  Insight: Fair   Chartered certified accountant: Fair  Attention Span: Fair  Recall: [employer] of Knowledge: Fair  Language: Fair   Psychomotor Activity  Psychomotor Activity: Psychomotor Activity: Restlessness; Tremor   Assets  Assets: Communication Skills; Desire for Improvement   Sleep  Sleep: Sleep: Poor Number of Hours of Sleep: 0 (has not been able to sleep for 3-4 days)   Nutritional Assessment (For OBS and FBC admissions only) Has the patient had a weight loss or gain of 10 pounds or more in the last 3 months?: Yes Has the patient had a decrease in food intake/or appetite?: Yes Does the patient have dental problems?: No Does the patient have eating habits or behaviors that may be indicators of an eating disorder including binging or inducing vomiting?: No Has the patient recently lost weight without trying?: 1 Has the patient been eating poorly because of a decreased appetite?: 1 Malnutrition Screening Tool Score: 2    Physical Exam Vitals and nursing note reviewed.  Constitutional:      General: He is in acute distress.     Appearance: He is ill-appearing.  HENT:     Head: Normocephalic and atraumatic.     Right Ear: Tympanic membrane normal.     Left Ear: Tympanic membrane normal.     Nose: Nose normal.     Mouth/Throat:     Mouth: Mucous membranes are dry.  Eyes:     Extraocular Movements: Extraocular movements  intact.     Pupils: Pupils are equal, round, and reactive to light.  Cardiovascular:     Rate and Rhythm: Normal rate.     Pulses: Normal pulses.  Pulmonary:     Effort: Respiratory distress present.  Musculoskeletal:        General: Normal range of motion.     Cervical back: Normal range of motion and neck supple.  Skin:    General: Skin is warm and dry.  Neurological:     Mental Status: He is alert and oriented to person, place, and time.    Review of Systems  Constitutional: Negative.   HENT: Negative.    Eyes: Negative.   Respiratory:  Positive for shortness of breath.   Cardiovascular:        HTN, hx of CVA  Gastrointestinal:  Positive for nausea.  Genitourinary: Negative.   Musculoskeletal:        Generalized weakness  Skin: Negative.   Neurological: Negative.        Hx of seizures  Endo/Heme/Allergies: Negative.   Psychiatric/Behavioral:  Positive for depression, substance abuse and suicidal ideas. The patient is nervous/anxious and has insomnia.     Blood pressure (!) 170/88, pulse 75, temperature  98.6 F (37 C), temperature source Oral, resp. rate 19, SpO2 99%. There is no height or weight on file to calculate BMI.  Past Psychiatric History: MDD, Alcohol abuse   Is the patient at risk to self? Yes  Has the patient been a risk to self in the past 6 months? Yes .    Has the patient been a risk to self within the distant past? Yes   Is the patient a risk to others? No   Has the patient been a risk to others in the past 6 months? No   Has the patient been a risk to others within the distant past? No   Past Medical History: HTN, Seizures, CVA, Anemia  Family History: NA  Social History: Homeless, no support.   Last Labs:  Admission on 04/23/2023, Discharged on 04/23/2023  Component Date Value Ref Range Status   Sodium 04/23/2023 130 (L)  135 - 145 mmol/L Final   Potassium 04/23/2023 3.4 (L)  3.5 - 5.1 mmol/L Final   Chloride 04/23/2023 93 (L)  98 - 111  mmol/L Final   CO2 04/23/2023 23  22 - 32 mmol/L Final   Glucose, Bld 04/23/2023 102 (H)  70 - 99 mg/dL Final   Glucose reference range applies only to samples taken after fasting for at least 8 hours.   BUN 04/23/2023 <5 (L)  8 - 23 mg/dL Final   Creatinine, Ser 04/23/2023 0.64  0.61 - 1.24 mg/dL Final   Calcium 95/62/1308 9.0  8.9 - 10.3 mg/dL Final   Total Protein 65/78/4696 8.8 (H)  6.5 - 8.1 g/dL Final   Albumin 29/52/8413 3.6  3.5 - 5.0 g/dL Final   AST 24/40/1027 48 (H)  15 - 41 U/L Final   ALT 04/23/2023 38  0 - 44 U/L Final   Alkaline Phosphatase 04/23/2023 83  38 - 126 U/L Final   Total Bilirubin 04/23/2023 1.9 (H)  0.3 - 1.2 mg/dL Final   GFR, Estimated 04/23/2023 >60  >60 mL/min Final   Comment: (NOTE) Calculated using the CKD-EPI Creatinine Equation (2021)    Anion gap 04/23/2023 14  5 - 15 Final   Performed at [facility], [address], [place], [place] [address]   Lipase 04/23/2023 61 (H)  11 - 51 U/L Final   Performed at [facility], [address], [place], [place] [address]   WBC 04/23/2023 7.7  4.0 - 10.5 K/uL Final   RBC 04/23/2023 4.75  4.22 - 5.81 MIL/uL Final   Hemoglobin 04/23/2023 11.5 (L)  13.0 - 17.0 g/dL Final   HCT 74/25/9563 36.1 (L)  39.0 - 52.0 % Final   MCV 04/23/2023 76.0 (L)  80.0 - 100.0 fL Final   MCH 04/23/2023 24.2 (L)  26.0 - 34.0 pg Final   MCHC 04/23/2023 31.9  30.0 - 36.0 g/dL Final   RDW 87/56/4332 18.6 (H)  11.5 - 15.5 % Final   Platelets 04/23/2023 174  150 - 400 K/uL Final   nRBC 04/23/2023 0.0  0.0 - 0.2 % Final   Neutrophils Relative % 04/23/2023 66  % Final   Neutro Abs 04/23/2023 5.0  1.7 - 7.7 K/uL Final   Lymphocytes Relative 04/23/2023 23  % Final   Lymphs Abs 04/23/2023 1.7  0.7 - 4.0 K/uL Final   Monocytes Relative 04/23/2023 9  % Final   Monocytes Absolute 04/23/2023 0.7  0.1 - 1.0 K/uL Final   Eosinophils Relative 04/23/2023 1  % Final  Eosinophils Absolute 04/23/2023 0.1  0.0 - 0.5 K/uL Final    Basophils Relative 04/23/2023 1  % Final   Basophils Absolute 04/23/2023 0.1  0.0 - 0.1 K/uL Final   Immature Granulocytes 04/23/2023 0  % Final   Abs Immature Granulocytes 04/23/2023 0.03  0.00 - 0.07 K/uL Final   Performed at [facility], [address] [place], [place] [address]   Troponin I (High Sensitivity) 04/23/2023 7  <18 ng/L Final   Comment: (NOTE) Elevated high sensitivity troponin I (hsTnI) values and significant  changes across serial measurements may suggest ACS but many other  chronic and acute conditions are known to elevate hsTnI results.  Refer to the "Links" section for chest pain algorithms and additional  guidance. Performed at [facility], [address], [place], [place] [address]    Troponin I (High Sensitivity) 04/23/2023 7  <18 ng/L Final   Comment: (NOTE) Elevated high sensitivity troponin I (hsTnI) values and significant  changes across serial measurements may suggest ACS but many other  chronic and acute conditions are known to elevate hsTnI results.  Refer to the "Links" section for chest pain algorithms and additional  guidance. Performed at [facility], [address] [place], [place] [address]   Admission on 02/10/2023, Discharged on 02/11/2023  Component Date Value Ref Range Status   WBC 02/10/2023 5.8  4.0 - 10.5 K/uL Final   RBC 02/10/2023 5.32  4.22 - 5.81 MIL/uL Final   Hemoglobin 02/10/2023 12.9 (L)  13.0 - 17.0 g/dL Final   HCT 08/13/2535 40.9  39.0 - 52.0 % Final   MCV 02/10/2023 76.9 (L)  80.0 - 100.0 fL Final   MCH 02/10/2023 24.2 (L)  26.0 - 34.0 pg Final   MCHC 02/10/2023 31.5  30.0 - 36.0 g/dL Final   RDW 64/40/3474 19.7 (H)  11.5 - 15.5 % Final   Platelets 02/10/2023 117 (L)  150 - 400 K/uL Final   nRBC 02/10/2023 0.0  0.0 - 0.2 % Final   Neutrophils Relative % 02/10/2023 34  % Final   Neutro Abs 02/10/2023 1.9  1.7 - 7.7 K/uL Final   Lymphocytes Relative 02/10/2023 49  % Final   Lymphs Abs 02/10/2023 2.9   0.7 - 4.0 K/uL Final   Monocytes Relative 02/10/2023 13  % Final   Monocytes Absolute 02/10/2023 0.8  0.1 - 1.0 K/uL Final   Eosinophils Relative 02/10/2023 2  % Final   Eosinophils Absolute 02/10/2023 0.1  0.0 - 0.5 K/uL Final   Basophils Relative 02/10/2023 2  % Final   Basophils Absolute 02/10/2023 0.1  0.0 - 0.1 K/uL Final   Immature Granulocytes 02/10/2023 0  % Final   Abs Immature Granulocytes 02/10/2023 0.01  0.00 - 0.07 K/uL Final   Performed at [facility], [address] [place], [place] [address]   Sodium 02/10/2023 138  135 - 145 mmol/L Final   Potassium 02/10/2023 3.7  3.5 - 5.1 mmol/L Final   Chloride 02/10/2023 103  98 - 111 mmol/L Final   CO2 02/10/2023 20 (L)  22 - 32 mmol/L Final   Glucose, Bld 02/10/2023 96  70 - 99 mg/dL Final   Glucose reference range applies only to samples taken after fasting for at least 8 hours.   BUN 02/10/2023 8  8 - 23 mg/dL Final   Creatinine, Ser 02/10/2023 0.74  0.61 - 1.24 mg/dL Final   Calcium 38/75/6433 8.6 (L)  8.9 - 10.3 mg/dL  Final   Total Protein 02/10/2023 8.4 (H)  6.5 - 8.1 g/dL Final   Albumin 16/07/9603 3.9  3.5 - 5.0 g/dL Final   AST 54/06/8118 51 (H)  15 - 41 U/L Final   ALT 02/10/2023 37  0 - 44 U/L Final   Alkaline Phosphatase 02/10/2023 78  38 - 126 U/L Final   Total Bilirubin 02/10/2023 2.0 (H)  0.3 - 1.2 mg/dL Final   GFR, Estimated 02/10/2023 >60  >60 mL/min Final   Comment: (NOTE) Calculated using the CKD-EPI Creatinine Equation (2021)    Anion gap 02/10/2023 15  5 - 15 Final   Performed at [facility], [address] [place], [place] [address]   Lipase 02/10/2023 77 (H)  11 - 51 U/L Final   Performed at [facility], [address] [place], [place] [address]   Color, Urine 02/11/2023 YELLOW  YELLOW Final   APPearance 02/11/2023 CLEAR  CLEAR Final   Specific Gravity, Urine 02/11/2023 1.011  1.005 - 1.030 Final   pH 02/11/2023 5.0  5.0 - 8.0 Final    Glucose, UA 02/11/2023 NEGATIVE  NEGATIVE mg/dL Final   Hgb urine dipstick 02/11/2023 NEGATIVE  NEGATIVE Final   Bilirubin Urine 02/11/2023 NEGATIVE  NEGATIVE Final   Ketones, ur 02/11/2023 NEGATIVE  NEGATIVE mg/dL Final   Protein, ur 30/86/5784 30 (A)  NEGATIVE mg/dL Final   Nitrite 69/62/9528 NEGATIVE  NEGATIVE Final   Leukocytes,Ua 02/11/2023 NEGATIVE  NEGATIVE Final   RBC / HPF 02/11/2023 0-5  0 - 5 RBC/hpf Final   WBC, UA 02/11/2023 0-5  0 - 5 WBC/hpf Final   Bacteria, UA 02/11/2023 NONE SEEN  NONE SEEN Final   Squamous Epithelial / HPF 02/11/2023 0-5  0 - 5 /HPF Final   Mucus 02/11/2023 PRESENT   Final   Performed at [facility], [address], [place], [place] [address]   Alcohol, Ethyl (B) 02/10/2023 205 (H)  <10 mg/dL Final   Comment: (NOTE) Lowest detectable limit for serum alcohol is 10 mg/dL.  For medical purposes only. Performed at [facility], [address], [place], [place] [address]    Opiates 02/11/2023 NONE DETECTED  NONE DETECTED Final   Cocaine 02/11/2023 NONE DETECTED  NONE DETECTED Final   Benzodiazepines 02/11/2023 NONE DETECTED  NONE DETECTED Final   Amphetamines 02/11/2023 NONE DETECTED  NONE DETECTED Final   Tetrahydrocannabinol 02/11/2023 NONE DETECTED  NONE DETECTED Final   Barbiturates 02/11/2023 NONE DETECTED  NONE DETECTED Final   Comment: (NOTE) DRUG SCREEN FOR MEDICAL PURPOSES ONLY.  IF CONFIRMATION IS NEEDED FOR ANY PURPOSE, NOTIFY LAB WITHIN 5 DAYS.  LOWEST DETECTABLE LIMITS FOR URINE DRUG SCREEN Drug Class                     Cutoff (ng/mL) Amphetamine and metabolites    1000 Barbiturate and metabolites    200 Benzodiazepine                 200 Opiates and metabolites        300 Cocaine and metabolites        300 THC                            50 Performed at [facility], [address] [place], [place] [address]   Admission on 01/24/2023, Discharged on 01/24/2023   Component Date Value Ref Range Status  Sodium 01/24/2023 139  135 - 145 mmol/L Final   Potassium 01/24/2023 3.9  3.5 - 5.1 mmol/L Final   Chloride 01/24/2023 107  98 - 111 mmol/L Final   CO2 01/24/2023 24  22 - 32 mmol/L Final   Glucose, Bld 01/24/2023 86  70 - 99 mg/dL Final   Glucose reference range applies only to samples taken after fasting for at least 8 hours.   BUN 01/24/2023 12  8 - 23 mg/dL Final   Creatinine, Ser 01/24/2023 0.85  0.61 - 1.24 mg/dL Final   Calcium 40/98/1191 8.4 (L)  8.9 - 10.3 mg/dL Final   Total Protein 47/82/9562 7.6  6.5 - 8.1 g/dL Final   Albumin 13/05/6577 3.4 (L)  3.5 - 5.0 g/dL Final   AST 46/96/2952 59 (H)  15 - 41 U/L Final   ALT 01/24/2023 30  0 - 44 U/L Final   Alkaline Phosphatase 01/24/2023 64  38 - 126 U/L Final   Total Bilirubin 01/24/2023 1.3 (H)  0.3 - 1.2 mg/dL Final   GFR, Estimated 01/24/2023 >60  >60 mL/min Final   Comment: (NOTE) Calculated using the CKD-EPI Creatinine Equation (2021)    Anion gap 01/24/2023 8  5 - 15 Final   Performed at [facility], [address], [place], [place] [address]   Alcohol, Ethyl (B) 01/24/2023 249 (H)  <10 mg/dL Final   Comment: (NOTE) Lowest detectable limit for serum alcohol is 10 mg/dL.  For medical purposes only. Performed at [facility], [address] [place], [place] [address]    Troponin I (High Sensitivity) 01/24/2023 5  <18 ng/L Final   Comment: (NOTE) Elevated high sensitivity troponin I (hsTnI) values and significant  changes across serial measurements may suggest ACS but many other  chronic and acute conditions are known to elevate hsTnI results.  Refer to the "Links" section for chest pain algorithms and additional  guidance. Performed at [facility], [address], [place], [place] [address]    WBC 01/24/2023 4.5  4.0 - 10.5 K/uL Final   RBC 01/24/2023 5.20  4.22 - 5.81 MIL/uL Final   Hemoglobin 01/24/2023 12.2 (L)   13.0 - 17.0 g/dL Final   HCT 66/44/0347 40.4  39.0 - 52.0 % Final   MCV 01/24/2023 77.7 (L)  80.0 - 100.0 fL Final   MCH 01/24/2023 23.5 (L)  26.0 - 34.0 pg Final   MCHC 01/24/2023 30.2  30.0 - 36.0 g/dL Final   RDW 42/59/5638 19.4 (H)  11.5 - 15.5 % Final   Platelets 01/24/2023 133 (L)  150 - 400 K/uL Final   nRBC 01/24/2023 0.0  0.0 - 0.2 % Final   Neutrophils Relative % 01/24/2023 48  % Final   Neutro Abs 01/24/2023 2.2  1.7 - 7.7 K/uL Final   Lymphocytes Relative 01/24/2023 39  % Final   Lymphs Abs 01/24/2023 1.8  0.7 - 4.0 K/uL Final   Monocytes Relative 01/24/2023 9  % Final   Monocytes Absolute 01/24/2023 0.4  0.1 - 1.0 K/uL Final   Eosinophils Relative 01/24/2023 3  % Final   Eosinophils Absolute 01/24/2023 0.1  0.0 - 0.5 K/uL Final   Basophils Relative 01/24/2023 1  % Final   Basophils Absolute 01/24/2023 0.1  0.0 - 0.1 K/uL Final   Immature Granulocytes 01/24/2023 0  % Final   Abs Immature Granulocytes 01/24/2023 0.00  0.00 - 0.07 K/uL Final   Performed at [facility], [address] [place], [place] [address]   Prothrombin Time 01/24/2023 13.8  11.4 - 15.2 seconds Final   INR 01/24/2023 1.1  0.8 - 1.2 Final   Comment: (NOTE) INR goal varies based on device and disease states. Performed at [facility], [address], [place], [place] [address]    ABO/RH(D) 01/24/2023 O POS   Final   Antibody Screen 01/24/2023 NEG   Final   Sample Expiration 01/24/2023    Final                   Value:01/27/2023,2359 Performed at [facility], [address] [place], [place] [address]    Opiates 01/24/2023 NONE DETECTED  NONE DETECTED Final   Cocaine 01/24/2023 NONE DETECTED  NONE DETECTED Final   Benzodiazepines 01/24/2023 NONE DETECTED  NONE DETECTED Final   Amphetamines 01/24/2023 NONE DETECTED  NONE DETECTED Final   Tetrahydrocannabinol 01/24/2023 NONE DETECTED  NONE DETECTED Final   Barbiturates 01/24/2023 NONE DETECTED   NONE DETECTED Final   Comment: (NOTE) DRUG SCREEN FOR MEDICAL PURPOSES ONLY.  IF CONFIRMATION IS NEEDED FOR ANY PURPOSE, NOTIFY LAB WITHIN 5 DAYS.  LOWEST DETECTABLE LIMITS FOR URINE DRUG SCREEN Drug Class                     Cutoff (ng/mL) Amphetamine and metabolites    1000 Barbiturate and metabolites    200 Benzodiazepine                 200 Opiates and metabolites        300 Cocaine and metabolites        300 THC                            50 Performed at [facility], [address], [place], [place] [address]   Admission on 01/09/2023, Discharged on 01/09/2023  Component Date Value Ref Range Status   Sodium 01/09/2023 135  135 - 145 mmol/L Final   Potassium 01/09/2023 3.8  3.5 - 5.1 mmol/L Final   Chloride 01/09/2023 97 (L)  98 - 111 mmol/L Final   CO2 01/09/2023 27  22 - 32 mmol/L Final   Glucose, Bld 01/09/2023 101 (H)  70 - 99 mg/dL Final   Glucose reference range applies only to samples taken after fasting for at least 8 hours.   BUN 01/09/2023 7 (L)  8 - 23 mg/dL Final   Creatinine, Ser 01/09/2023 0.77  0.61 - 1.24 mg/dL Final   Calcium 29/56/2130 8.9  8.9 - 10.3 mg/dL Final   Total Protein 86/57/8469 8.5 (H)  6.5 - 8.1 g/dL Final   Albumin 62/95/2841 3.9  3.5 - 5.0 g/dL Final   AST 32/44/0102 91 (H)  15 - 41 U/L Final   ALT 01/09/2023 69 (H)  0 - 44 U/L Final   Alkaline Phosphatase 01/09/2023 79  38 - 126 U/L Final   Total Bilirubin 01/09/2023 2.1 (H)  0.3 - 1.2 mg/dL Final   GFR, Estimated 01/09/2023 >60  >60 mL/min Final   Comment: (NOTE) Calculated using the CKD-EPI Creatinine Equation (2021)    Anion gap 01/09/2023 11  5 - 15 Final   Performed at [facility], [address], [place], [place] [address]   Alcohol, Ethyl (B) 01/09/2023 258 (H)  <10 mg/dL Final   Comment: (NOTE) Lowest detectable limit for serum alcohol is 10 mg/dL.  For medical purposes  only. Performed at [facility], [address], [place], [place] [address]    Salicylate Lvl 01/09/2023 <7.0 (L)  7.0 - 30.0 mg/dL Final   Performed at [facility], [address] [place], [place] [address]   Acetaminophen (Tylenol), Serum 01/09/2023 <10 (L)  10 - 30 ug/mL Final   Comment: (NOTE) Therapeutic concentrations vary significantly. A range of 10-30 ug/mL  may be an effective concentration for many patients. However, some  are best treated at concentrations outside of this range. Acetaminophen concentrations >150 ug/mL at 4 hours after ingestion  and >50 ug/mL at 12 hours after ingestion are often associated with  toxic reactions.  Performed at [facility], [address] [place], [place] [address]    WBC 01/09/2023 4.3  4.0 - 10.5 K/uL Final   RBC 01/09/2023 5.64  4.22 - 5.81 MIL/uL Final   Hemoglobin 01/09/2023 13.2  13.0 - 17.0 g/dL Final   HCT 93/23/5573 43.0  39.0 - 52.0 % Final   MCV 01/09/2023 76.2 (L)  80.0 - 100.0 fL Final   MCH 01/09/2023 23.4 (L)  26.0 - 34.0 pg Final   MCHC 01/09/2023 30.7  30.0 - 36.0 g/dL Final   RDW 22/11/5425 18.7 (H)  11.5 - 15.5 % Final   Platelets 01/09/2023 96 (L)  150 - 400 K/uL Final   Comment: SPECIMEN CHECKED FOR CLOTS REPEATED TO VERIFY PLATELET COUNT CONFIRMED BY SMEAR    nRBC 01/09/2023 0.0  0.0 - 0.2 % Final   Performed at [facility], [address] [place], [place] [address]   Opiates 01/09/2023 NONE DETECTED  NONE DETECTED Final   Cocaine 01/09/2023 NONE DETECTED  NONE DETECTED Final   Benzodiazepines 01/09/2023 NONE DETECTED  NONE DETECTED Final   Amphetamines 01/09/2023 NONE DETECTED  NONE DETECTED Final   Tetrahydrocannabinol 01/09/2023 NONE DETECTED  NONE DETECTED Final   Barbiturates 01/09/2023 NONE DETECTED  NONE DETECTED Final   Comment: (NOTE) DRUG SCREEN FOR MEDICAL PURPOSES ONLY.  IF CONFIRMATION IS NEEDED FOR ANY PURPOSE, NOTIFY LAB WITHIN 5 DAYS.  LOWEST DETECTABLE LIMITS FOR  URINE DRUG SCREEN Drug Class                     Cutoff (ng/mL) Amphetamine and metabolites    1000 Barbiturate and metabolites    200 Benzodiazepine                 200 Opiates and metabolites        300 Cocaine and metabolites        300 THC                            50 Performed at [facility], [address], [place], [place] [address]    SARS Coronavirus 2 by RT PCR 01/09/2023 NEGATIVE  NEGATIVE Final   Comment: (NOTE) SARS-CoV-2 target nucleic acids are NOT DETECTED.  The SARS-CoV-2 RNA is generally detectable in upper respiratory specimens during the acute phase of infection. The lowest concentration of SARS-CoV-2 viral copies this assay can detect is 138 copies/mL. A negative result does not preclude SARS-Cov-2 infection and should not be used as the sole basis for treatment or other patient management decisions. A negative result may occur with  improper specimen collection/handling, submission of specimen other than nasopharyngeal swab, presence of viral mutation(s) within the areas targeted by this assay, and inadequate number  of viral copies(<138 copies/mL). A negative result must be combined with clinical observations, patient history, and epidemiological information. The expected result is Negative.  Fact Sheet for Patients:  BloggerCourse.com  Fact Sheet for Healthcare Providers:  SeriousBroker.it  This test is no                          t yet approved or cleared by the [place] FDA and  has been authorized for detection and/or diagnosis of SARS-CoV-2 by FDA under an Emergency Use Authorization (EUA). This EUA will remain  in effect (meaning this test can be used) for the duration of the COVID-19 declaration under Section 564(b)(1) of the Act, 21 U.S.C.section 360bbb-3(b)(1), unless the authorization is terminated  or revoked sooner.       Influenza A by PCR 01/09/2023 NEGATIVE   NEGATIVE Final   Influenza B by PCR 01/09/2023 NEGATIVE  NEGATIVE Final   Comment: (NOTE) The Xpert Xpress SARS-CoV-2/FLU/RSV plus assay is intended as an aid in the diagnosis of influenza from Nasopharyngeal swab specimens and should not be used as a sole basis for treatment. Nasal washings and aspirates are unacceptable for Xpert Xpress SARS-CoV-2/FLU/RSV testing.  Fact Sheet for Patients: BloggerCourse.com  Fact Sheet for Healthcare Providers: SeriousBroker.it  This test is not yet approved or cleared by the [place] FDA and has been authorized for detection and/or diagnosis of SARS-CoV-2 by FDA under an Emergency Use Authorization (EUA). This EUA will remain in effect (meaning this test can be used) for the duration of the COVID-19 declaration under Section 564(b)(1) of the Act, 21 U.S.C. section 360bbb-3(b)(1), unless the authorization is terminated or revoked.     Resp Syncytial Virus by PCR 01/09/2023 NEGATIVE  NEGATIVE Final   Comment: (NOTE) Fact Sheet for Patients: BloggerCourse.com  Fact Sheet for Healthcare Providers: SeriousBroker.it  This test is not yet approved or cleared by the [place] FDA and has been authorized for detection and/or diagnosis of SARS-CoV-2 by FDA under an Emergency Use Authorization (EUA). This EUA will remain in effect (meaning this test can be used) for the duration of the COVID-19 declaration under Section 564(b)(1) of the Act, 21 U.S.C. section 360bbb-3(b)(1), unless the authorization is terminated or revoked.  Performed at [facility], [address], [place], [place] [address]    Cholesterol 01/09/2023 174  0 - 200 mg/dL Final   Triglycerides 02/72/5366 53  <150 mg/dL Final   HDL 44/12/4740 92  >40 mg/dL Final   Total CHOL/HDL Ratio 01/09/2023 1.9  RATIO Final   VLDL 01/09/2023 11  0 - 40 mg/dL Final    LDL Cholesterol 01/09/2023 71  0 - 99 mg/dL Final   Comment:        Total Cholesterol/HDL:CHD Risk Coronary Heart Disease Risk Table                     Men   Women  1/2 Average Risk   3.4   3.3  Average Risk       5.0   4.4  2 X Average Risk   9.6   7.1  3 X Average Risk  23.4   11.0        Use the calculated Patient Ratio above and the CHD Risk Table to determine the patient's CHD Risk.        ATP III CLASSIFICATION (LDL):  <100     mg/dL   Optimal  595-638  mg/dL  Near or Above                    Optimal  130-159  mg/dL   Borderline  563-875  mg/dL   High  >643     mg/dL   Very High Performed at [facility], [address], [place], [place] [address]    TSH 01/09/2023 2.744  0.350 - 4.500 uIU/mL Final   Comment: Performed by a 3rd Generation assay with a functional sensitivity of <=0.01 uIU/mL. Performed at [facility], [address], [place], [place] [address]    Magnesium 01/09/2023 2.1  1.7 - 2.4 mg/dL Final   Performed at [facility], [address], [place], [place] [address]   Color, Urine 01/09/2023 STRAW (A)  YELLOW Final   APPearance 01/09/2023 CLEAR  CLEAR Final   Specific Gravity, Urine 01/09/2023 1.002 (L)  1.005 - 1.030 Final   pH 01/09/2023 6.0  5.0 - 8.0 Final   Glucose, UA 01/09/2023 NEGATIVE  NEGATIVE mg/dL Final   Hgb urine dipstick 01/09/2023 NEGATIVE  NEGATIVE Final   Bilirubin Urine 01/09/2023 NEGATIVE  NEGATIVE Final   Ketones, ur 01/09/2023 NEGATIVE  NEGATIVE mg/dL Final   Protein, ur 16/10/930 NEGATIVE  NEGATIVE mg/dL Final   Nitrite 35/57/3220 NEGATIVE  NEGATIVE Final   Leukocytes,Ua 01/09/2023 NEGATIVE  NEGATIVE Final   Performed at [facility], [address] [place], [place] [address]   Prolactin 01/09/2023 27.2 (H)  3.6 - 25.2 ng/mL Final   Comment: (NOTE) Performed At: [facility] [address] [place], [place] [id number] Jolene Schimke MD  TD:[phone]     Allergies: Carrot [daucus carota] and Carrot [daucus carota]  Medications:  PTA Medications  Medication Sig   thiamine (VITAMIN B-1) 100 MG tablet Take 1 tablet (100 mg total) by mouth daily.   Multiple Vitamin (MULTIVITAMIN WITH MINERALS) TABS tablet Take 1 tablet by mouth daily.   amLODipine (NORVASC) 10 MG tablet Take 1 tablet (10 mg total) by mouth daily.   gabapentin (NEURONTIN) 300 MG capsule Take 1 capsule (300 mg total) by mouth 3 (three) times daily.   clotrimazole (LOTRIMIN) 1 % cream Apply topically 2 (two) times daily.   amLODipine (NORVASC) 5 MG tablet Take 5 mg by mouth daily.   cloNIDine (CATAPRES) 0.1 MG tablet Take 1 tablet (0.1 mg total) by mouth 2 (two) times daily.   QUEtiapine (SEROQUEL XR) 200 MG 24 hr tablet Take 1 tablet (200 mg total) by mouth at bedtime.   gabapentin (NEURONTIN) 300 MG capsule Take 1 capsule (300 mg total) by mouth 3 (three) times daily.   oxyCODONE (OXY IR/ROXICODONE) 5 MG immediate release tablet Take 1 tablet (5 mg total) by mouth every 8 (eight) hours as needed for moderate pain or severe pain.   sucralfate (CARAFATE) 1 g tablet Take 1 tablet (1 g total) by mouth 4 (four) times daily -  with meals and at bedtime.   omeprazole (PRILOSEC) 20 MG capsule Take 1 capsule (20 mg total) by mouth daily.      Medical Decision Making  Admit to observation unit. Initiate CIWA protocol. Te be reevaluated in AM to determine proper disposition.   Medications:  Ativan 1 mg PO per CIWA protocol Clonidine 0.1 mg PO BID Labs: CBC, CMP, RPR, A1c, Ethanol, Magnesium, Lipid Panel, UA, UDS, TSH, Hep function  EKG     Recommendations  Based on my evaluation the patient appears to have an emergency medical  condition for which I recommend the patient be transferred to the emergency department for further evaluation. Patient sent to [name] ED for medical Clearance. May return once medically cleared. Dr Anitra Lauth is the accepting  MD  Olin Pia, NP 05/06/23  10:38 AM

## 2023-05-06 NOTE — Progress Notes (Signed)
   05/06/23 1008  [facility] Triage Screening (Walk-ins at [facility] only)  What Is the Reason for Your Visit/Call Today? Pt is a 67 yo male who presented via [facility] requesting detox from alcohol. Pt stated that he last drank alcohol about 30 minutes before he came to [facility]. Pt reported SI and stated "if I had a gun I'd shoot myself." Pt reported he did not have access to a gun or firearms. Pt denied HI, NSSH and any other substance use other than alcohol consumption. Pt stated he has tried to kill himself once "15 years ago" by cutting himself on his forearm. Pt state that he has been inpatient in a psychiatric hospital with the last time occurring in 1996.Pt has been seen in the ED 8 times since 01/09/2023 with multiple reports of SI but no reported attempts. Pt reported he is experiencing VH and seeing "all kinds of things." Pt stated he does not know if he is only having VH when he is intoxicated because he stated "I'm drinking all the time." Pt reported drinking alcohol daily in excessive amounts.  How Long Has This Been Causing You Problems? > than 6 months  Have You Recently Had Any Thoughts About Hurting Yourself? Yes  How long ago did you have thoughts about hurting yourself? chronic SI- ongoing  Are You Planning to Commit Suicide/Harm Yourself At This time? No  Have you Recently Had Thoughts About Hurting Someone [name]? No  Are You Planning To Harm Someone At This Time? No  Are you currently experiencing any auditory, visual or other hallucinations? Yes  Please explain the hallucinations you are currently experiencing: VH of "all kinds of things"  Have You Used Any Alcohol or Drugs in the Past 24 Hours? Yes  How long ago did you use Drugs or Alcohol? unknown amoutn about 30 minutes PTA  What Did You Use and How Much? unknown amount  Do you have any current medical co-morbidities that require immediate attention? No (nonr reported)  Clinician description of patient physical appearance/behavior: casual dress  and disheveled. irritable mood and flat affect. poor judgment and insight. calm but irritable, seems alert and is oriented.  What Do You Feel Would Help You the Most Today? Alcohol or Drug Use Treatment  If access to [facility] was not available, would you have sought care in the Emergency Department? Yes  Determination of Need Urgent (48 hours)  Options For Referral Facility-Based Crisis
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
## 2023-05-06 NOTE — ED Triage Notes (Signed)
Pt BIB [facility] from [facility]. Pt is at [facility] for alcohol detox. Pt's last drink was 10:15am. Pt's complaint is SOB.   EMS VS P 81 R 18 BP 137/71 O2 97% RA CBG 108

## 2023-05-06 NOTE — ED Notes (Signed)
Patient transferring at this time to [facility] ED via EMS due to alcohol intoxication with trouble breathing. Patient is alert with adequate breathing at time of transfer. Report given to Brayton El, RN at [facility] ED and report provided to EMS along with EMTALA and transfer paperwork. All belongings/valuables sent with patient.

## 2023-05-07 ENCOUNTER — Other Ambulatory Visit (INDEPENDENT_AMBULATORY_CARE_PROVIDER_SITE_OTHER)
Admission: EM | Admit: 2023-05-07 | Discharge: 2023-05-12 | Disposition: A | Discharge status: Home / Self Care | Payer: Medicare HMO | Source: Home / Self Care | Admitting: Family

## 2023-05-07 ENCOUNTER — Ambulatory Visit (INDEPENDENT_AMBULATORY_CARE_PROVIDER_SITE_OTHER)
Admission: EM | Admit: 2023-05-07 | Discharge: 2023-05-07 | Disposition: A | Discharge status: Home / Self Care | Payer: Medicare HMO | Source: Home / Self Care

## 2023-05-07 DIAGNOSIS — F1010 Alcohol abuse, uncomplicated: Secondary | ICD-10-CM | POA: Diagnosis present

## 2023-05-07 DIAGNOSIS — R45851 Suicidal ideations: Secondary | ICD-10-CM | POA: Insufficient documentation

## 2023-05-07 DIAGNOSIS — Z91199 Patient's noncompliance with other medical treatment and regimen due to unspecified reason: Secondary | ICD-10-CM | POA: Insufficient documentation

## 2023-05-07 DIAGNOSIS — Z9151 Personal history of suicidal behavior: Secondary | ICD-10-CM | POA: Insufficient documentation

## 2023-05-07 DIAGNOSIS — F601 Schizoid personality disorder: Secondary | ICD-10-CM | POA: Insufficient documentation

## 2023-05-07 DIAGNOSIS — F1094 Alcohol use, unspecified with alcohol-induced mood disorder: Secondary | ICD-10-CM

## 2023-05-07 DIAGNOSIS — K2210 Ulcer of esophagus without bleeding: Secondary | ICD-10-CM | POA: Insufficient documentation

## 2023-05-07 DIAGNOSIS — F1024 Alcohol dependence with alcohol-induced mood disorder: Secondary | ICD-10-CM | POA: Insufficient documentation

## 2023-05-07 DIAGNOSIS — F1020 Alcohol dependence, uncomplicated: Secondary | ICD-10-CM | POA: Diagnosis not present

## 2023-05-07 DIAGNOSIS — N390 Urinary tract infection, site not specified: Secondary | ICD-10-CM | POA: Insufficient documentation

## 2023-05-07 DIAGNOSIS — B9620 Unspecified Escherichia coli [E. coli] as the cause of diseases classified elsewhere: Secondary | ICD-10-CM | POA: Insufficient documentation

## 2023-05-07 DIAGNOSIS — R0602 Shortness of breath: Secondary | ICD-10-CM | POA: Diagnosis not present

## 2023-05-07 DIAGNOSIS — K7460 Unspecified cirrhosis of liver: Secondary | ICD-10-CM | POA: Insufficient documentation

## 2023-05-07 MED ORDER — CHLORDIAZEPOXIDE HCL 25 MG PO CAPS
25.0000 mg | ORAL_CAPSULE | ORAL | Status: AC
  Administered 2023-05-09 (×2): 25 mg via ORAL
  Filled 2023-05-07 (×2): qty 1

## 2023-05-07 MED ORDER — CHLORDIAZEPOXIDE HCL 25 MG PO CAPS
25.0000 mg | ORAL_CAPSULE | Freq: Three times a day (TID) | ORAL | Status: AC
  Administered 2023-05-08 (×3): 25 mg via ORAL
  Filled 2023-05-07 (×3): qty 1

## 2023-05-07 MED ORDER — LORAZEPAM 1 MG PO TABS
1.0000 mg | ORAL_TABLET | Freq: Four times a day (QID) | ORAL | Status: DC | PRN
  Administered 2023-05-07 (×2): 1 mg via ORAL
  Filled 2023-05-07 (×2): qty 1

## 2023-05-07 MED ORDER — LOPERAMIDE HCL 2 MG PO CAPS: 2.0000 mg | ORAL_CAPSULE | ORAL | Status: AC | PRN

## 2023-05-07 MED ORDER — ALUM & MAG HYDROXIDE-SIMETH 200-200-20 MG/5ML PO SUSP: 30.0000 mL | ORAL | Status: DC | PRN

## 2023-05-07 MED ORDER — THIAMINE MONONITRATE 100 MG PO TABS
100.0000 mg | ORAL_TABLET | Freq: Every day | ORAL | Status: DC
  Administered 2023-05-08 – 2023-05-12 (×5): 100 mg via ORAL
  Filled 2023-05-07 (×5): qty 1
  Filled 2023-05-07: qty 3

## 2023-05-07 MED ORDER — CHLORPROMAZINE HCL 25 MG PO TABS
25.0000 mg | ORAL_TABLET | Freq: Once | ORAL | Status: AC
  Administered 2023-05-07: 25 mg via ORAL
  Filled 2023-05-07: qty 1

## 2023-05-07 MED ORDER — CHLORDIAZEPOXIDE HCL 25 MG PO CAPS
25.0000 mg | ORAL_CAPSULE | Freq: Every day | ORAL | Status: AC
  Administered 2023-05-10: 25 mg via ORAL
  Filled 2023-05-07: qty 1

## 2023-05-07 MED ORDER — THIAMINE HCL 100 MG/ML IJ SOLN
100.0000 mg | Freq: Once | INTRAMUSCULAR | Status: DC
  Filled 2023-05-07: qty 2

## 2023-05-07 MED ORDER — MAGNESIUM HYDROXIDE 400 MG/5ML PO SUSP: 30.0000 mL | Freq: Every day | ORAL | Status: DC | PRN

## 2023-05-07 MED ORDER — CHLORDIAZEPOXIDE HCL 25 MG PO CAPS
25.0000 mg | ORAL_CAPSULE | Freq: Four times a day (QID) | ORAL | Status: AC
  Administered 2023-05-07 (×4): 25 mg via ORAL
  Filled 2023-05-07 (×4): qty 1

## 2023-05-07 MED ORDER — ADULT MULTIVITAMIN W/MINERALS CH
1.0000 | ORAL_TABLET | Freq: Every day | ORAL | Status: DC
  Administered 2023-05-07: 1 via ORAL
  Filled 2023-05-07: qty 1

## 2023-05-07 MED ORDER — THIAMINE MONONITRATE 100 MG PO TABS
100.0000 mg | ORAL_TABLET | Freq: Every day | ORAL | Status: DC
  Administered 2023-05-07: 100 mg via ORAL
  Filled 2023-05-07: qty 1

## 2023-05-07 MED ORDER — GABAPENTIN 100 MG PO CAPS
200.0000 mg | ORAL_CAPSULE | Freq: Two times a day (BID) | ORAL | Status: DC
  Administered 2023-05-07 – 2023-05-08 (×3): 200 mg via ORAL
  Filled 2023-05-07 (×3): qty 2

## 2023-05-07 MED ORDER — ONDANSETRON 4 MG PO TBDP: 4.0000 mg | ORAL_TABLET | Freq: Four times a day (QID) | ORAL | Status: DC | PRN

## 2023-05-07 MED ORDER — PANTOPRAZOLE SODIUM 40 MG PO TBEC
40.0000 mg | DELAYED_RELEASE_TABLET | Freq: Every day | ORAL | Status: DC
  Administered 2023-05-07 – 2023-05-12 (×6): 40 mg via ORAL
  Filled 2023-05-07 (×6): qty 1

## 2023-05-07 MED ORDER — LORAZEPAM 1 MG PO TABS: 1.0000 mg | ORAL_TABLET | Freq: Four times a day (QID) | ORAL | Status: DC | PRN

## 2023-05-07 MED ORDER — CHLORPROMAZINE HCL 25 MG PO TABS
25.0000 mg | ORAL_TABLET | Freq: Every day | ORAL | Status: DC | PRN
  Administered 2023-05-08 – 2023-05-10 (×2): 25 mg via ORAL

## 2023-05-07 MED ORDER — ADULT MULTIVITAMIN W/MINERALS CH
1.0000 | ORAL_TABLET | Freq: Every day | ORAL | Status: DC
  Administered 2023-05-08 – 2023-05-12 (×5): 1 via ORAL
  Filled 2023-05-07 (×5): qty 1

## 2023-05-07 MED ORDER — HYDROXYZINE HCL 25 MG PO TABS: 25.0000 mg | ORAL_TABLET | Freq: Four times a day (QID) | ORAL | Status: DC | PRN

## 2023-05-07 MED ORDER — HYDROXYZINE HCL 25 MG PO TABS
25.0000 mg | ORAL_TABLET | Freq: Four times a day (QID) | ORAL | Status: AC | PRN
  Administered 2023-05-08: 25 mg via ORAL
  Filled 2023-05-07: qty 1

## 2023-05-07 MED ORDER — LOPERAMIDE HCL 2 MG PO CAPS: 2.0000 mg | ORAL_CAPSULE | ORAL | Status: DC | PRN

## 2023-05-07 MED ORDER — THIAMINE MONONITRATE 100 MG PO TABS: 100.0000 mg | ORAL_TABLET | Freq: Every day | ORAL | Status: DC

## 2023-05-07 MED ORDER — ONDANSETRON 4 MG PO TBDP: 4.0000 mg | ORAL_TABLET | Freq: Four times a day (QID) | ORAL | Status: AC | PRN

## 2023-05-07 MED ORDER — ACETAMINOPHEN 325 MG PO TABS
650.0000 mg | ORAL_TABLET | Freq: Four times a day (QID) | ORAL | Status: DC | PRN
  Administered 2023-05-08 – 2023-05-12 (×6): 650 mg via ORAL
  Filled 2023-05-07 (×6): qty 2

## 2023-05-07 MED ORDER — THIAMINE HCL 100 MG/ML IJ SOLN: 100.0000 mg | Freq: Once | INTRAMUSCULAR | Status: DC

## 2023-05-07 MED ORDER — CHLORDIAZEPOXIDE HCL 25 MG PO CAPS: 25.0000 mg | ORAL_CAPSULE | Freq: Four times a day (QID) | ORAL | Status: AC | PRN

## 2023-05-07 NOTE — Progress Notes (Signed)
Patient transferred to [facility] health prior to phenobarbital IV being administered. Order # [id number] wasted in Main RX stericycle.  Ruben Im, PharmD Clinical Pharmacist 05/07/2023 3:42 AM Please check AMION for all [facility] numbers
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
## 2023-05-07 NOTE — ED Provider Notes (Signed)
[facility] Continuous Assessment Admission H&P  Date: 05/07/23 Patient Name: [name] MRN: [id number] Chief Complaint: alcohol abuse  Diagnoses:  Final diagnoses:  Alcohol abuse    HPI: [name] Ishmael. [name] is a 67 year old male who presented on 05/06/23 to [facility] voluntarily for suicidal ideations and alcohol abuse.  Patient was evaluated by psychiatry and TTS and admitted to the [facility] observation unit due to elevated BP, weakness, tremors and chest discomfort patient was transferred to [name], ED for medical clearance.  Patient has since returned to [facility].   Nurse practitioner assessed patient face-to-face and reviewed chart.  Patient is alert oriented x 4, calm and cooperative, mood is depressed and anxious with congruent affect.  Patient denies any SI/HI/AVH.  Patient does not appear to be responding to any internal or external stimuli at this time.  Patient denies any pain at this time. Patient will be admitted to [facility] continuous observation for crisis management, safety and stabilization.  [name] is a 67 year old male presenting to [facility] voluntarily, complaining of  increased suicidal ideations, having an urge to overdose on Fentanyl. Patient reports that he has a problem of abusing alcohol and has not been able to control himself. Reports that "I have nothing", that he has lost everything due to alcohol abuse.  He reports that he uses about 10-12 beers on daily basis  and last use was right before he came to [facility]. Patient reports that not having support, being homeless, his girlfriend being in jail, are some of his emotional instabilities, leading to alcohol abuse. Patient reports he has been feeling depressed to the point of thinking about shooting himself or overdosing on Fentanyl. He reports a hx of overdosing and last attempt was in 2010 when he took a bunch of pills trying to die in sleep. Patient reports not taking medications for a while.  He was suppose to receive ACTT services but "I didn't keep up with them".  He reports feeling hopeless and helpless and endorsing suicidal thoughts and intending to overdose on Fentanyl. Symptoms worsened when his girlfriend went to jail because he was staying with her in her car. Patient is now homeless and has access to substances every day. He reports not having services to help with both his mental/and physical health needs.    Assessment: [name] is a 67 year-old male who is sitting in the assessment room. He is disheveled, restless, anxious, tearful and shouts "I don't want to live, I want to die". Patient is alert and oriented x 4. He is somewhat guarded, not willing to provide detailed information. His thought process is organized and goal-oriented. His speech is pressured with drooling. He has poor eye contact and is irritable.  Patient admits to endorsing suicidal ideations and planning to overdose on Fentanyl. He denies HI/AVH. He deos not appear to be responding to internal stimuli. Reports that his main problem is alcoholism and currently not having any services. Reports he has been off medications for a while and has not been seeing a PCP. Reports he is supposed to be receiving ACTT services but did not keep up with them and was discharged. Patient reports that his last alcohol intake was today prior to coming here. He reports that he is tired of living his current lifestyle and considering a long term treatment service. He reports that alcohol is the only substance he has been using.    Patient presents with symptoms including  anxiety, restlessness, tremors, mild nausea, irritability and generalized weakness. He presents with a hx of HTN, depression, Hep C, anemia, hx of CVA, hx of seizures, and has not taken medications in a while. He presents with elevated BP. His EKG is normal. Patient reports poor appetite and insomnia. He refuses to discuss his current health status and states "I just  need help with my addiction or I will just die, I have nothing to live for, I have lost everything".  Total Time spent with patient: 15 minutes  Musculoskeletal  Strength & Muscle Tone: within normal limits Gait & Station: normal Patient leans: N/A  Psychiatric Specialty Exam  Presentation General Appearance:  Casual  Eye Contact: Fleeting  Speech: Clear and Coherent  Speech Volume: Normal  Handedness: Right   Mood and Affect  Mood: Anxious  Affect: Blunt   Thought Process  Thought Processes: Coherent  Descriptions of Associations:Intact  Orientation:Full (Time, Place and Person)  Thought Content:Logical  Diagnosis of Schizophrenia or Schizoaffective disorder in past: No  Duration of Psychotic Symptoms: Greater than six months  Hallucinations:Hallucinations: None  Ideas of Reference:None  Suicidal Thoughts:Suicidal Thoughts: Yes, Passive SI Passive Intent and/or Plan: With Intent  Homicidal Thoughts:Homicidal Thoughts: No   Sensorium  Memory: Immediate Fair; Recent Fair; Remote Fair  Judgment: Fair  Insight: Fair   Chartered certified accountant: Fair  Attention Span: Fair  Recall: [employer] of Knowledge: Fair  Language: Fair   Psychomotor Activity  Psychomotor Activity: Psychomotor Activity: Restlessness   Assets  Assets: Manufacturing systems engineer; Desire for Improvement; Physical Health   Sleep  Sleep: Sleep: Poor Number of Hours of Sleep: 0   Nutritional Assessment (For OBS and FBC admissions only) Has the patient had a weight loss or gain of 10 pounds or more in the last 3 months?: Yes Has the patient had a decrease in food intake/or appetite?: Yes Does the patient have dental problems?: No Does the patient have eating habits or behaviors that may be indicators of an eating disorder including binging or inducing vomiting?: No Has the patient recently lost weight without trying?: 1 Has the patient been  eating poorly because of a decreased appetite?: 1 Malnutrition Screening Tool Score: 2    Physical Exam Constitutional:      Appearance: Normal appearance.  HENT:     Head: Normocephalic and atraumatic.     Nose: Nose normal.  Eyes:     Pupils: Pupils are equal, round, and reactive to light.  Cardiovascular:     Rate and Rhythm: Normal rate.  Pulmonary:     Effort: Pulmonary effort is normal.  Abdominal:     General: Abdomen is flat.  Musculoskeletal:        General: Normal range of motion.     Cervical back: Normal range of motion.  Skin:    General: Skin is warm.  Neurological:     Mental Status: He is alert and oriented to person, place, and time.  Psychiatric:        Attention and Perception: Attention normal.        Mood and Affect: Mood is anxious and depressed.        Speech: Speech normal.        Behavior: Behavior is cooperative.        Thought Content: Thought content normal.        Cognition and Memory: Cognition normal.        Judgment: Judgment is impulsive.    Review of Systems  Constitutional: Negative.   HENT: Negative.    Eyes: Negative.   Respiratory: Negative.    Cardiovascular: Negative.   Gastrointestinal: Negative.   Genitourinary: Negative.   Musculoskeletal: Negative.   Skin: Negative.   Neurological: Negative.   Endo/Heme/Allergies: Negative.   Psychiatric/Behavioral:  Positive for depression and substance abuse.     Blood pressure (!) 148/87, pulse 70, temperature 98.9 F (37.2 C), temperature source Oral, resp. rate (!) 70, SpO2 99%. There is no height or weight on file to calculate BMI.  Past Psychiatric History: Alcohol abuse, anxiety, depression, schizoaffective disorder, suicidal ideation  Is the patient at risk to self? No  Has the patient been a risk to self in the past 6 months? No .    Has the patient been a risk to self within the distant past? No   Is the patient a risk to others? No   Has the patient been a risk to  others in the past 6 months? No   Has the patient been a risk to others within the distant past? No   Past Medical History: Hypertension, hep C, liver cirrhosis, pancreatitis  Family History: unknown  Social History: 67 y/o homeless male  Last Labs:  Admission on 05/06/2023, Discharged on 05/06/2023  Component Date Value Ref Range Status   Sodium 05/06/2023 135  135 - 145 mmol/L Final   Potassium 05/06/2023 3.4 (L)  3.5 - 5.1 mmol/L Final   Chloride 05/06/2023 101  98 - 111 mmol/L Final   CO2 05/06/2023 22  22 - 32 mmol/L Final   Glucose, Bld 05/06/2023 89  70 - 99 mg/dL Final   Glucose reference range applies only to samples taken after fasting for at least 8 hours.   BUN 05/06/2023 8  8 - 23 mg/dL Final   Creatinine, Ser 05/06/2023 0.70  0.61 - 1.24 mg/dL Final   Calcium 25/36/6440 8.8 (L)  8.9 - 10.3 mg/dL Final   Total Protein 34/74/2595 7.6  6.5 - 8.1 g/dL Final   Albumin 63/87/5643 3.2 (L)  3.5 - 5.0 g/dL Final   AST 32/95/1884 43 (H)  15 - 41 U/L Final   ALT 05/06/2023 26  0 - 44 U/L Final   Alkaline Phosphatase 05/06/2023 75  38 - 126 U/L Final   Total Bilirubin 05/06/2023 1.6 (H)  0.3 - 1.2 mg/dL Final   GFR, Estimated 05/06/2023 >60  >60 mL/min Final   Comment: (NOTE) Calculated using the CKD-EPI Creatinine Equation (2021)    Anion gap 05/06/2023 12  5 - 15 Final   Performed at [facility], [address] [place], [place] [address]   Alcohol, Ethyl (B) 05/06/2023 134 (H)  <10 mg/dL Final   Comment: (NOTE) Lowest detectable limit for serum alcohol is 10 mg/dL.  For medical purposes only. Performed at [facility], [address], [place], [place] [address]    Opiates 05/06/2023 NONE DETECTED  NONE DETECTED Final   Cocaine 05/06/2023 NONE DETECTED  NONE DETECTED Final   Benzodiazepines 05/06/2023 NONE DETECTED  NONE DETECTED Final   Amphetamines 05/06/2023 NONE DETECTED  NONE DETECTED Final   Tetrahydrocannabinol 05/06/2023 NONE DETECTED  NONE  DETECTED Final   Barbiturates 05/06/2023 NONE DETECTED  NONE DETECTED Final   Comment: (NOTE) DRUG SCREEN FOR MEDICAL PURPOSES ONLY.  IF CONFIRMATION IS NEEDED FOR ANY PURPOSE, NOTIFY LAB WITHIN 5 DAYS.  LOWEST DETECTABLE LIMITS FOR URINE DRUG SCREEN Drug Class  Cutoff (ng/mL) Amphetamine and metabolites    1000 Barbiturate and metabolites    200 Benzodiazepine                 200 Opiates and metabolites        300 Cocaine and metabolites        300 THC                            50 Performed at [facility], [address], [place], [place] [address]    WBC 05/06/2023 4.8  4.0 - 10.5 K/uL Final   RBC 05/06/2023 4.28  4.22 - 5.81 MIL/uL Final   Hemoglobin 05/06/2023 10.6 (L)  13.0 - 17.0 g/dL Final   HCT 84/69/6295 33.2 (L)  39.0 - 52.0 % Final   MCV 05/06/2023 77.6 (L)  80.0 - 100.0 fL Final   MCH 05/06/2023 24.8 (L)  26.0 - 34.0 pg Final   MCHC 05/06/2023 31.9  30.0 - 36.0 g/dL Final   RDW 28/41/3244 17.9 (H)  11.5 - 15.5 % Final   Platelets 05/06/2023 106 (L)  150 - 400 K/uL Final   nRBC 05/06/2023 0.0  0.0 - 0.2 % Final   Neutrophils Relative % 05/06/2023 57  % Final   Neutro Abs 05/06/2023 2.7  1.7 - 7.7 K/uL Final   Lymphocytes Relative 05/06/2023 29  % Final   Lymphs Abs 05/06/2023 1.4  0.7 - 4.0 K/uL Final   Monocytes Relative 05/06/2023 12  % Final   Monocytes Absolute 05/06/2023 0.6  0.1 - 1.0 K/uL Final   Eosinophils Relative 05/06/2023 1  % Final   Eosinophils Absolute 05/06/2023 0.1  0.0 - 0.5 K/uL Final   Basophils Relative 05/06/2023 1  % Final   Basophils Absolute 05/06/2023 0.1  0.0 - 0.1 K/uL Final   Immature Granulocytes 05/06/2023 0  % Final   Abs Immature Granulocytes 05/06/2023 0.01  0.00 - 0.07 K/uL Final   Performed at [facility], [address], [place], [place] [address]   Acetaminophen (Tylenol), Serum 05/06/2023 <10 (L)  10 - 30 ug/mL Final   Comment: (NOTE) Therapeutic concentrations vary significantly. A range  of 10-30 ug/mL  may be an effective concentration for many patients. However, some  are best treated at concentrations outside of this range. Acetaminophen concentrations >150 ug/mL at 4 hours after ingestion  and >50 ug/mL at 12 hours after ingestion are often associated with  toxic reactions.  Performed at [facility], [address], [place], [place] [address]    Salicylate Lvl 05/06/2023 <7.0 (L)  7.0 - 30.0 mg/dL Final   Performed at [facility], [address] [place], [place] [address]   Troponin I (High Sensitivity) 05/06/2023 6  <18 ng/L Final   Comment: (NOTE) Elevated high sensitivity troponin I (hsTnI) values and significant  changes across serial measurements may suggest ACS but many other  chronic and acute conditions are known to elevate hsTnI results.  Refer to the "Links" section for chest pain algorithms and additional  guidance. Performed at [facility], [address] [place], [place] [address]    D-Dimer, Quant 05/06/2023 0.59 (H)  0.00 - 0.50 ug/mL-FEU Final   Comment: (NOTE) At the manufacturer cut-off value of 0.5 g/mL FEU, this assay has a negative predictive value of 95-100%.This assay is intended for use in conjunction with a clinical pretest probability (PTP) assessment model  to exclude pulmonary embolism (PE) and deep venous thrombosis (DVT) in outpatients suspected of PE or DVT. Results should be correlated with clinical presentation. Performed at [facility], [address], [place], [place] [address]   Admission on 05/06/2023, Discharged on 05/06/2023  Component Date Value Ref Range Status   Color, Urine 05/06/2023 YELLOW  YELLOW Final   APPearance 05/06/2023 HAZY (A)  CLEAR Final   Specific Gravity, Urine 05/06/2023 1.002 (L)  1.005 - 1.030 Final   pH 05/06/2023 5.0  5.0 - 8.0 Final   Glucose, UA 05/06/2023 NEGATIVE  NEGATIVE mg/dL Final   Hgb urine dipstick 05/06/2023 SMALL (A)  NEGATIVE Final   Bilirubin Urine  05/06/2023 NEGATIVE  NEGATIVE Final   Ketones, ur 05/06/2023 NEGATIVE  NEGATIVE mg/dL Final   Protein, ur 25/36/6440 NEGATIVE  NEGATIVE mg/dL Final   Nitrite 34/74/2595 NEGATIVE  NEGATIVE Final   Leukocytes,Ua 05/06/2023 LARGE (A)  NEGATIVE Final   RBC / HPF 05/06/2023 0-5  0 - 5 RBC/hpf Final   WBC, UA 05/06/2023 11-20  0 - 5 WBC/hpf Final   Bacteria, UA 05/06/2023 RARE (A)  NONE SEEN Final   Squamous Epithelial / HPF 05/06/2023 0-5  0 - 5 /HPF Final   Performed at [facility], [address], [place], [place] [address]   POC Amphetamine UR 05/06/2023 None Detected  NONE DETECTED (Cut Off Level 1000 ng/mL) Final   POC Secobarbital (BAR) 05/06/2023 None Detected  NONE DETECTED (Cut Off Level 300 ng/mL) Final   POC Buprenorphine (BUP) 05/06/2023 None Detected  NONE DETECTED (Cut Off Level 10 ng/mL) Final   POC Oxazepam (BZO) 05/06/2023 None Detected  NONE DETECTED (Cut Off Level 300 ng/mL) Final   POC Cocaine UR 05/06/2023 None Detected  NONE DETECTED (Cut Off Level 300 ng/mL) Final   POC Methamphetamine UR 05/06/2023 None Detected  NONE DETECTED (Cut Off Level 1000 ng/mL) Final   POC Morphine 05/06/2023 None Detected  NONE DETECTED (Cut Off Level 300 ng/mL) Final   POC Methadone UR 05/06/2023 None Detected  NONE DETECTED (Cut Off Level 300 ng/mL) Final   POC Oxycodone UR 05/06/2023 None Detected  NONE DETECTED (Cut Off Level 100 ng/mL) Final   POC Marijuana UR 05/06/2023 None Detected  NONE DETECTED (Cut Off Level 50 ng/mL) Final  Admission on 04/23/2023, Discharged on 04/23/2023  Component Date Value Ref Range Status   Sodium 04/23/2023 130 (L)  135 - 145 mmol/L Final   Potassium 04/23/2023 3.4 (L)  3.5 - 5.1 mmol/L Final   Chloride 04/23/2023 93 (L)  98 - 111 mmol/L Final   CO2 04/23/2023 23  22 - 32 mmol/L Final   Glucose, Bld 04/23/2023 102 (H)  70 - 99 mg/dL Final   Glucose reference range applies only to samples taken after fasting for at least 8 hours.   BUN 04/23/2023 <5  (L)  8 - 23 mg/dL Final   Creatinine, Ser 04/23/2023 0.64  0.61 - 1.24 mg/dL Final   Calcium 64/33/2951 9.0  8.9 - 10.3 mg/dL Final   Total Protein 88/41/6606 8.8 (H)  6.5 - 8.1 g/dL Final   Albumin 30/16/0109 3.6  3.5 - 5.0 g/dL Final   AST 32/35/5732 48 (H)  15 - 41 U/L Final   ALT 04/23/2023 38  0 - 44 U/L Final   Alkaline Phosphatase 04/23/2023 83  38 - 126 U/L Final   Total Bilirubin 04/23/2023 1.9 (H)  0.3 - 1.2 mg/dL Final   GFR, Estimated 04/23/2023 >60  >  60 mL/min Final   Comment: (NOTE) Calculated using the CKD-EPI Creatinine Equation (2021)    Anion gap 04/23/2023 14  5 - 15 Final   Performed at [facility], [address] [place], [place] [address]   Lipase 04/23/2023 61 (H)  11 - 51 U/L Final   Performed at [facility], [address], [place], [place] [address]   WBC 04/23/2023 7.7  4.0 - 10.5 K/uL Final   RBC 04/23/2023 4.75  4.22 - 5.81 MIL/uL Final   Hemoglobin 04/23/2023 11.5 (L)  13.0 - 17.0 g/dL Final   HCT 09/32/3557 36.1 (L)  39.0 - 52.0 % Final   MCV 04/23/2023 76.0 (L)  80.0 - 100.0 fL Final   MCH 04/23/2023 24.2 (L)  26.0 - 34.0 pg Final   MCHC 04/23/2023 31.9  30.0 - 36.0 g/dL Final   RDW 32/20/2542 18.6 (H)  11.5 - 15.5 % Final   Platelets 04/23/2023 174  150 - 400 K/uL Final   nRBC 04/23/2023 0.0  0.0 - 0.2 % Final   Neutrophils Relative % 04/23/2023 66  % Final   Neutro Abs 04/23/2023 5.0  1.7 - 7.7 K/uL Final   Lymphocytes Relative 04/23/2023 23  % Final   Lymphs Abs 04/23/2023 1.7  0.7 - 4.0 K/uL Final   Monocytes Relative 04/23/2023 9  % Final   Monocytes Absolute 04/23/2023 0.7  0.1 - 1.0 K/uL Final   Eosinophils Relative 04/23/2023 1  % Final   Eosinophils Absolute 04/23/2023 0.1  0.0 - 0.5 K/uL Final   Basophils Relative 04/23/2023 1  % Final   Basophils Absolute 04/23/2023 0.1  0.0 - 0.1 K/uL Final   Immature Granulocytes 04/23/2023 0  % Final   Abs Immature Granulocytes 04/23/2023 0.03  0.00 - 0.07 K/uL Final   Performed at  [facility], [address] [place], [place] [address]   Troponin I (High Sensitivity) 04/23/2023 7  <18 ng/L Final   Comment: (NOTE) Elevated high sensitivity troponin I (hsTnI) values and significant  changes across serial measurements may suggest ACS but many other  chronic and acute conditions are known to elevate hsTnI results.  Refer to the "Links" section for chest pain algorithms and additional  guidance. Performed at [facility], [address], [place], [place] [address]    Troponin I (High Sensitivity) 04/23/2023 7  <18 ng/L Final   Comment: (NOTE) Elevated high sensitivity troponin I (hsTnI) values and significant  changes across serial measurements may suggest ACS but many other  chronic and acute conditions are known to elevate hsTnI results.  Refer to the "Links" section for chest pain algorithms and additional  guidance. Performed at [facility], [address] [place], [place] [address]   Admission on 02/10/2023, Discharged on 02/11/2023  Component Date Value Ref Range Status   WBC 02/10/2023 5.8  4.0 - 10.5 K/uL Final   RBC 02/10/2023 5.32  4.22 - 5.81 MIL/uL Final   Hemoglobin 02/10/2023 12.9 (L)  13.0 - 17.0 g/dL Final   HCT 16/04/3709 40.9  39.0 - 52.0 % Final   MCV 02/10/2023 76.9 (L)  80.0 - 100.0 fL Final   MCH 02/10/2023 24.2 (L)  26.0 - 34.0 pg Final   MCHC 02/10/2023 31.5  30.0 - 36.0 g/dL Final   RDW 62/69/4854 19.7 (H)  11.5 - 15.5 % Final   Platelets 02/10/2023 117 (L)  150 - 400 K/uL Final   nRBC 02/10/2023 0.0  0.0 - 0.2 % Final   Neutrophils Relative % 02/10/2023 34  % Final   Neutro Abs 02/10/2023 1.9  1.7 - 7.7 K/uL Final   Lymphocytes Relative 02/10/2023 49  % Final   Lymphs Abs 02/10/2023 2.9  0.7 - 4.0 K/uL Final   Monocytes Relative 02/10/2023 13  % Final   Monocytes Absolute 02/10/2023 0.8  0.1 - 1.0 K/uL Final   Eosinophils Relative 02/10/2023 2  % Final   Eosinophils Absolute 02/10/2023 0.1  0.0 - 0.5 K/uL Final    Basophils Relative 02/10/2023 2  % Final   Basophils Absolute 02/10/2023 0.1  0.0 - 0.1 K/uL Final   Immature Granulocytes 02/10/2023 0  % Final   Abs Immature Granulocytes 02/10/2023 0.01  0.00 - 0.07 K/uL Final   Performed at [facility], [address], [place], [place] [address]   Sodium 02/10/2023 138  135 - 145 mmol/L Final   Potassium 02/10/2023 3.7  3.5 - 5.1 mmol/L Final   Chloride 02/10/2023 103  98 - 111 mmol/L Final   CO2 02/10/2023 20 (L)  22 - 32 mmol/L Final   Glucose, Bld 02/10/2023 96  70 - 99 mg/dL Final   Glucose reference range applies only to samples taken after fasting for at least 8 hours.   BUN 02/10/2023 8  8 - 23 mg/dL Final   Creatinine, Ser 02/10/2023 0.74  0.61 - 1.24 mg/dL Final   Calcium 60/45/4098 8.6 (L)  8.9 - 10.3 mg/dL Final   Total Protein 11/91/4782 8.4 (H)  6.5 - 8.1 g/dL Final   Albumin 95/62/1308 3.9  3.5 - 5.0 g/dL Final   AST 65/78/4696 51 (H)  15 - 41 U/L Final   ALT 02/10/2023 37  0 - 44 U/L Final   Alkaline Phosphatase 02/10/2023 78  38 - 126 U/L Final   Total Bilirubin 02/10/2023 2.0 (H)  0.3 - 1.2 mg/dL Final   GFR, Estimated 02/10/2023 >60  >60 mL/min Final   Comment: (NOTE) Calculated using the CKD-EPI Creatinine Equation (2021)    Anion gap 02/10/2023 15  5 - 15 Final   Performed at [facility], [address] [place], [place] [address]   Lipase 02/10/2023 77 (H)  11 - 51 U/L Final   Performed at [facility], [address] [place], [place] [address]   Color, Urine 02/11/2023 YELLOW  YELLOW Final   APPearance 02/11/2023 CLEAR  CLEAR Final   Specific Gravity, Urine 02/11/2023 1.011  1.005 - 1.030 Final   pH 02/11/2023 5.0  5.0 - 8.0 Final   Glucose, UA 02/11/2023 NEGATIVE  NEGATIVE mg/dL Final   Hgb urine dipstick 02/11/2023 NEGATIVE  NEGATIVE Final   Bilirubin Urine 02/11/2023 NEGATIVE  NEGATIVE Final   Ketones, ur 02/11/2023 NEGATIVE  NEGATIVE mg/dL Final   Protein,  ur 40/07/2724 30 (A)  NEGATIVE mg/dL Final   Nitrite 36/64/4034 NEGATIVE  NEGATIVE Final   Leukocytes,Ua 02/11/2023 NEGATIVE  NEGATIVE Final   RBC / HPF 02/11/2023 0-5  0 - 5 RBC/hpf Final   WBC, UA 02/11/2023 0-5  0 - 5 WBC/hpf Final   Bacteria, UA 02/11/2023 NONE SEEN  NONE SEEN Final   Squamous Epithelial / HPF 02/11/2023 0-5  0 - 5 /HPF Final   Mucus 02/11/2023 PRESENT   Final   Performed at [facility], [address] [place], [place] [address]   Alcohol, Ethyl (B) 02/10/2023 205 (H)  <10 mg/dL Final   Comment: (NOTE)  Lowest detectable limit for serum alcohol is 10 mg/dL.  For medical purposes only. Performed at [facility], [address] [place], [place] [address]    Opiates 02/11/2023 NONE DETECTED  NONE DETECTED Final   Cocaine 02/11/2023 NONE DETECTED  NONE DETECTED Final   Benzodiazepines 02/11/2023 NONE DETECTED  NONE DETECTED Final   Amphetamines 02/11/2023 NONE DETECTED  NONE DETECTED Final   Tetrahydrocannabinol 02/11/2023 NONE DETECTED  NONE DETECTED Final   Barbiturates 02/11/2023 NONE DETECTED  NONE DETECTED Final   Comment: (NOTE) DRUG SCREEN FOR MEDICAL PURPOSES ONLY.  IF CONFIRMATION IS NEEDED FOR ANY PURPOSE, NOTIFY LAB WITHIN 5 DAYS.  LOWEST DETECTABLE LIMITS FOR URINE DRUG SCREEN Drug Class                     Cutoff (ng/mL) Amphetamine and metabolites    1000 Barbiturate and metabolites    200 Benzodiazepine                 200 Opiates and metabolites        300 Cocaine and metabolites        300 THC                            50 Performed at [facility], [address], [place], [place] [address]   Admission on 01/24/2023, Discharged on 01/24/2023  Component Date Value Ref Range Status   Sodium 01/24/2023 139  135 - 145 mmol/L Final   Potassium 01/24/2023 3.9  3.5 - 5.1 mmol/L Final   Chloride 01/24/2023 107  98 - 111 mmol/L Final   CO2 01/24/2023 24  22 - 32 mmol/L Final   Glucose,  Bld 01/24/2023 86  70 - 99 mg/dL Final   Glucose reference range applies only to samples taken after fasting for at least 8 hours.   BUN 01/24/2023 12  8 - 23 mg/dL Final   Creatinine, Ser 01/24/2023 0.85  0.61 - 1.24 mg/dL Final   Calcium 29/56/2130 8.4 (L)  8.9 - 10.3 mg/dL Final   Total Protein 86/57/8469 7.6  6.5 - 8.1 g/dL Final   Albumin 62/95/2841 3.4 (L)  3.5 - 5.0 g/dL Final   AST 32/44/0102 59 (H)  15 - 41 U/L Final   ALT 01/24/2023 30  0 - 44 U/L Final   Alkaline Phosphatase 01/24/2023 64  38 - 126 U/L Final   Total Bilirubin 01/24/2023 1.3 (H)  0.3 - 1.2 mg/dL Final   GFR, Estimated 01/24/2023 >60  >60 mL/min Final   Comment: (NOTE) Calculated using the CKD-EPI Creatinine Equation (2021)    Anion gap 01/24/2023 8  5 - 15 Final   Performed at [facility], [address] [place], [place] [address]   Alcohol, Ethyl (B) 01/24/2023 249 (H)  <10 mg/dL Final   Comment: (NOTE) Lowest detectable limit for serum alcohol is 10 mg/dL.  For medical purposes only. Performed at [facility], [address], [place], [place] [address]    Troponin I (High Sensitivity) 01/24/2023 5  <18 ng/L Final   Comment: (NOTE) Elevated high sensitivity troponin I (hsTnI) values and significant  changes across serial measurements may suggest ACS but many other  chronic and acute conditions are known to elevate hsTnI results.  Refer to the "Links" section for chest pain algorithms and additional  guidance. Performed at [facility], [address] [place], [place]  [address]    WBC 01/24/2023 4.5  4.0 - 10.5 K/uL Final   RBC 01/24/2023 5.20  4.22 - 5.81 MIL/uL Final   Hemoglobin 01/24/2023 12.2 (L)  13.0 - 17.0 g/dL Final   HCT 60/45/4098 40.4  39.0 - 52.0 % Final   MCV 01/24/2023 77.7 (L)  80.0 - 100.0 fL Final   MCH 01/24/2023 23.5 (L)  26.0 - 34.0 pg Final   MCHC 01/24/2023 30.2  30.0 - 36.0 g/dL Final   RDW 11/91/4782 19.4 (H)  11.5  - 15.5 % Final   Platelets 01/24/2023 133 (L)  150 - 400 K/uL Final   nRBC 01/24/2023 0.0  0.0 - 0.2 % Final   Neutrophils Relative % 01/24/2023 48  % Final   Neutro Abs 01/24/2023 2.2  1.7 - 7.7 K/uL Final   Lymphocytes Relative 01/24/2023 39  % Final   Lymphs Abs 01/24/2023 1.8  0.7 - 4.0 K/uL Final   Monocytes Relative 01/24/2023 9  % Final   Monocytes Absolute 01/24/2023 0.4  0.1 - 1.0 K/uL Final   Eosinophils Relative 01/24/2023 3  % Final   Eosinophils Absolute 01/24/2023 0.1  0.0 - 0.5 K/uL Final   Basophils Relative 01/24/2023 1  % Final   Basophils Absolute 01/24/2023 0.1  0.0 - 0.1 K/uL Final   Immature Granulocytes 01/24/2023 0  % Final   Abs Immature Granulocytes 01/24/2023 0.00  0.00 - 0.07 K/uL Final   Performed at [facility], [address], [place], [place] [address]   Prothrombin Time 01/24/2023 13.8  11.4 - 15.2 seconds Final   INR 01/24/2023 1.1  0.8 - 1.2 Final   Comment: (NOTE) INR goal varies based on device and disease states. Performed at [facility], [address], [place], [place] [address]    ABO/RH(D) 01/24/2023 O POS   Final   Antibody Screen 01/24/2023 NEG   Final   Sample Expiration 01/24/2023    Final                   Value:01/27/2023,2359 Performed at [facility], [address] [place], [place] [address]    Opiates 01/24/2023 NONE DETECTED  NONE DETECTED Final   Cocaine 01/24/2023 NONE DETECTED  NONE DETECTED Final   Benzodiazepines 01/24/2023 NONE DETECTED  NONE DETECTED Final   Amphetamines 01/24/2023 NONE DETECTED  NONE DETECTED Final   Tetrahydrocannabinol 01/24/2023 NONE DETECTED  NONE DETECTED Final   Barbiturates 01/24/2023 NONE DETECTED  NONE DETECTED Final   Comment: (NOTE) DRUG SCREEN FOR MEDICAL PURPOSES ONLY.  IF CONFIRMATION IS NEEDED FOR ANY PURPOSE, NOTIFY LAB WITHIN 5 DAYS.  LOWEST DETECTABLE LIMITS FOR URINE DRUG SCREEN Drug Class                     Cutoff  (ng/mL) Amphetamine and metabolites    1000 Barbiturate and metabolites    200 Benzodiazepine                 200 Opiates and metabolites        300 Cocaine and metabolites        300 THC                            50 Performed at [facility], [address], [place], [place] [address]   Admission on 01/09/2023, Discharged on 01/09/2023  Component Date Value Ref Range Status   Sodium  01/09/2023 135  135 - 145 mmol/L Final   Potassium 01/09/2023 3.8  3.5 - 5.1 mmol/L Final   Chloride 01/09/2023 97 (L)  98 - 111 mmol/L Final   CO2 01/09/2023 27  22 - 32 mmol/L Final   Glucose, Bld 01/09/2023 101 (H)  70 - 99 mg/dL Final   Glucose reference range applies only to samples taken after fasting for at least 8 hours.   BUN 01/09/2023 7 (L)  8 - 23 mg/dL Final   Creatinine, Ser 01/09/2023 0.77  0.61 - 1.24 mg/dL Final   Calcium 11/91/4782 8.9  8.9 - 10.3 mg/dL Final   Total Protein 95/62/1308 8.5 (H)  6.5 - 8.1 g/dL Final   Albumin 65/78/4696 3.9  3.5 - 5.0 g/dL Final   AST 29/52/8413 91 (H)  15 - 41 U/L Final   ALT 01/09/2023 69 (H)  0 - 44 U/L Final   Alkaline Phosphatase 01/09/2023 79  38 - 126 U/L Final   Total Bilirubin 01/09/2023 2.1 (H)  0.3 - 1.2 mg/dL Final   GFR, Estimated 01/09/2023 >60  >60 mL/min Final   Comment: (NOTE) Calculated using the CKD-EPI Creatinine Equation (2021)    Anion gap 01/09/2023 11  5 - 15 Final   Performed at [facility], [address], [place], [place] [address]   Alcohol, Ethyl (B) 01/09/2023 258 (H)  <10 mg/dL Final   Comment: (NOTE) Lowest detectable limit for serum alcohol is 10 mg/dL.  For medical purposes only. Performed at [facility], [address] [place], [place] [address]    Salicylate Lvl 01/09/2023 <7.0 (L)  7.0 - 30.0 mg/dL Final   Performed at [facility], [address], [place], [place] [address]   Acetaminophen (Tylenol), Serum 01/09/2023 <10 (L)  10  - 30 ug/mL Final   Comment: (NOTE) Therapeutic concentrations vary significantly. A range of 10-30 ug/mL  may be an effective concentration for many patients. However, some  are best treated at concentrations outside of this range. Acetaminophen concentrations >150 ug/mL at 4 hours after ingestion  and >50 ug/mL at 12 hours after ingestion are often associated with  toxic reactions.  Performed at [facility], [address] [place], [place] [address]    WBC 01/09/2023 4.3  4.0 - 10.5 K/uL Final   RBC 01/09/2023 5.64  4.22 - 5.81 MIL/uL Final   Hemoglobin 01/09/2023 13.2  13.0 - 17.0 g/dL Final   HCT 25/95/6387 43.0  39.0 - 52.0 % Final   MCV 01/09/2023 76.2 (L)  80.0 - 100.0 fL Final   MCH 01/09/2023 23.4 (L)  26.0 - 34.0 pg Final   MCHC 01/09/2023 30.7  30.0 - 36.0 g/dL Final   RDW 56/43/3295 18.7 (H)  11.5 - 15.5 % Final   Platelets 01/09/2023 96 (L)  150 - 400 K/uL Final   Comment: SPECIMEN CHECKED FOR CLOTS REPEATED TO VERIFY PLATELET COUNT CONFIRMED BY SMEAR    nRBC 01/09/2023 0.0  0.0 - 0.2 % Final   Performed at [facility], [address], [place], [place] [address]   Opiates 01/09/2023 NONE DETECTED  NONE DETECTED Final   Cocaine 01/09/2023 NONE DETECTED  NONE DETECTED Final   Benzodiazepines 01/09/2023 NONE DETECTED  NONE DETECTED Final   Amphetamines 01/09/2023 NONE DETECTED  NONE DETECTED Final   Tetrahydrocannabinol 01/09/2023 NONE DETECTED  NONE DETECTED Final   Barbiturates 01/09/2023 NONE DETECTED  NONE DETECTED Final   Comment: (NOTE) DRUG SCREEN  FOR MEDICAL PURPOSES ONLY.  IF CONFIRMATION IS NEEDED FOR ANY PURPOSE, NOTIFY LAB WITHIN 5 DAYS.  LOWEST DETECTABLE LIMITS FOR URINE DRUG SCREEN Drug Class                     Cutoff (ng/mL) Amphetamine and metabolites    1000 Barbiturate and metabolites    200 Benzodiazepine                 200 Opiates and metabolites        300 Cocaine and metabolites        300 THC                             50 Performed at [facility], [address], [place], [place] [address]    SARS Coronavirus 2 by RT PCR 01/09/2023 NEGATIVE  NEGATIVE Final   Comment: (NOTE) SARS-CoV-2 target nucleic acids are NOT DETECTED.  The SARS-CoV-2 RNA is generally detectable in upper respiratory specimens during the acute phase of infection. The lowest concentration of SARS-CoV-2 viral copies this assay can detect is 138 copies/mL. A negative result does not preclude SARS-Cov-2 infection and should not be used as the sole basis for treatment or other patient management decisions. A negative result may occur with  improper specimen collection/handling, submission of specimen other than nasopharyngeal swab, presence of viral mutation(s) within the areas targeted by this assay, and inadequate number of viral copies(<138 copies/mL). A negative result must be combined with clinical observations, patient history, and epidemiological information. The expected result is Negative.  Fact Sheet for Patients:  BloggerCourse.com  Fact Sheet for Healthcare Providers:  SeriousBroker.it  This test is no                          t yet approved or cleared by the [place] FDA and  has been authorized for detection and/or diagnosis of SARS-CoV-2 by FDA under an Emergency Use Authorization (EUA). This EUA will remain  in effect (meaning this test can be used) for the duration of the COVID-19 declaration under Section 564(b)(1) of the Act, 21 U.S.C.section 360bbb-3(b)(1), unless the authorization is terminated  or revoked sooner.       Influenza A by PCR 01/09/2023 NEGATIVE  NEGATIVE Final   Influenza B by PCR 01/09/2023 NEGATIVE  NEGATIVE Final   Comment: (NOTE) The Xpert Xpress SARS-CoV-2/FLU/RSV plus assay is intended as an aid in the diagnosis of influenza from Nasopharyngeal swab specimens and should not be used as a  sole basis for treatment. Nasal washings and aspirates are unacceptable for Xpert Xpress SARS-CoV-2/FLU/RSV testing.  Fact Sheet for Patients: BloggerCourse.com  Fact Sheet for Healthcare Providers: SeriousBroker.it  This test is not yet approved or cleared by the [place] FDA and has been authorized for detection and/or diagnosis of SARS-CoV-2 by FDA under an Emergency Use Authorization (EUA). This EUA will remain in effect (meaning this test can be used) for the duration of the COVID-19 declaration under Section 564(b)(1) of the Act, 21 U.S.C. section 360bbb-3(b)(1), unless the authorization is terminated or revoked.     Resp Syncytial Virus by PCR 01/09/2023 NEGATIVE  NEGATIVE Final   Comment: (NOTE) Fact Sheet for Patients: BloggerCourse.com  Fact Sheet for Healthcare Providers: SeriousBroker.it  This test is not yet approved or cleared by the [place] FDA and has been authorized for detection and/or diagnosis of SARS-CoV-2  by FDA under an Emergency Use Authorization (EUA). This EUA will remain in effect (meaning this test can be used) for the duration of the COVID-19 declaration under Section 564(b)(1) of the Act, 21 U.S.C. section 360bbb-3(b)(1), unless the authorization is terminated or revoked.  Performed at [facility], [address], [place], [place] [address]    Cholesterol 01/09/2023 174  0 - 200 mg/dL Final   Triglycerides 10/19/7251 53  <150 mg/dL Final   HDL 66/44/0347 92  >40 mg/dL Final   Total CHOL/HDL Ratio 01/09/2023 1.9  RATIO Final   VLDL 01/09/2023 11  0 - 40 mg/dL Final   LDL Cholesterol 01/09/2023 71  0 - 99 mg/dL Final   Comment:        Total Cholesterol/HDL:CHD Risk Coronary Heart Disease Risk Table                     Men   Women  1/2 Average Risk   3.4   3.3  Average Risk       5.0   4.4  2 X Average Risk   9.6    7.1  3 X Average Risk  23.4   11.0        Use the calculated Patient Ratio above and the CHD Risk Table to determine the patient's CHD Risk.        ATP III CLASSIFICATION (LDL):  <100     mg/dL   Optimal  425-956  mg/dL   Near or Above                    Optimal  130-159  mg/dL   Borderline  387-564  mg/dL   High  >332     mg/dL   Very High Performed at [facility], [address] [place], [place] [address]    TSH 01/09/2023 2.744  0.350 - 4.500 uIU/mL Final   Comment: Performed by a 3rd Generation assay with a functional sensitivity of <=0.01 uIU/mL. Performed at [facility], [address] [place], [place] [address]    Magnesium 01/09/2023 2.1  1.7 - 2.4 mg/dL Final   Performed at [facility], [address] [place], [place] [address]   Color, Urine 01/09/2023 STRAW (A)  YELLOW Final   APPearance 01/09/2023 CLEAR  CLEAR Final   Specific Gravity, Urine 01/09/2023 1.002 (L)  1.005 - 1.030 Final   pH 01/09/2023 6.0  5.0 - 8.0 Final   Glucose, UA 01/09/2023 NEGATIVE  NEGATIVE mg/dL Final   Hgb urine dipstick 01/09/2023 NEGATIVE  NEGATIVE Final   Bilirubin Urine 01/09/2023 NEGATIVE  NEGATIVE Final   Ketones, ur 01/09/2023 NEGATIVE  NEGATIVE mg/dL Final   Protein, ur 10/25/3233 NEGATIVE  NEGATIVE mg/dL Final   Nitrite 57/32/2025 NEGATIVE  NEGATIVE Final   Leukocytes,Ua 01/09/2023 NEGATIVE  NEGATIVE Final   Performed at [facility], [address] [place], [place] [address]   Prolactin 01/09/2023 27.2 (H)  3.6 - 25.2 ng/mL Final   Comment: (NOTE) Performed At: [facility] [address] [place], [place] [id number] Jolene Schimke MD VV:[id number]     Allergies: Carrot [daucus carota] and Carrot [daucus carota]  Medications:  Facility Ordered Medications  Medication   [COMPLETED] thiamine (VITAMIN B1) injection 100 mg   [COMPLETED] potassium chloride SA (KLOR-CON M) CR tablet 40 mEq    alum & mag hydroxide-simeth (MAALOX/MYLANTA) 200-200-20 MG/5ML suspension 30 mL   magnesium hydroxide (MILK OF  MAGNESIA) suspension 30 mL   multivitamin with minerals tablet 1 tablet   LORazepam (ATIVAN) tablet 1 mg   hydrOXYzine (ATARAX) tablet 25 mg   loperamide (IMODIUM) capsule 2-4 mg   ondansetron (ZOFRAN-ODT) disintegrating tablet 4 mg   thiamine (VITAMIN B1) tablet 100 mg   PTA Medications  Medication Sig   gabapentin (NEURONTIN) 300 MG capsule Take 1 capsule (300 mg total) by mouth 3 (three) times daily.   sucralfate (CARAFATE) 1 g tablet Take 1 tablet (1 g total) by mouth 4 (four) times daily -  with meals and at bedtime.   omeprazole (PRILOSEC) 20 MG capsule Take 1 capsule (20 mg total) by mouth daily.    Screenings    Flowsheet Row Most Recent Value  CIWA-Ar Total 9       Medical Decision Making  Blanca R. Blunck is a 67 year old male who presented on 05/06/23 to [facility] UC voluntarily for suicidal ideations and alcohol abuse.  Patient was evaluated by psychiatry and TTS and admitted to the [facility] observation unit due to elevated BP, weakness, tremors and chest discomfort patient was transferred to [name] ED for medical clearance.  Patient has since returned to [facility].    Recommendations  Based on my evaluation the patient does not appear to have an emergency medical condition.  Patient will be admitted to [facility] continuous observation for crisis management, stabilization and safety.  Jasper Riling, NP 05/07/23  7:37 AM

## 2023-05-07 NOTE — ED Provider Notes (Signed)
FBC/OBS ASAP Discharge Summary  Date and Time: 05/07/2023 12:47 PM  Name: [name]  MRN:  [id number]   Discharge Diagnoses:  Final diagnoses:  Alcohol use disorder, severe, dependence (HCC)  Alcohol-induced mood disorder with depressive symptoms (HCC)  Schizoid personality disorder (HCC)    Subjective: [name] is a 67 year old that presented to [facility] seeking detox treatment from alcohol. Patient well-known to this service line.  He has a charted history with alcohol abuse/dependency, personality disorder, major depression disorder and generalized anxiety disorder.  Patient reports he has not been taking medications as indicated.  Reported gabapentin and continue CIWA protocol.  Patient accepted to Facility Based Crisis.  [name] is sitting; he is alert/oriented x 4; calm/cooperative; and mood congruent with affect.  Patient is speaking in a clear tone at moderate volume, and normal pace; with good eye contact.  Her thought process is coherent and relevant; There is no indication that he is currently responding to internal/external stimuli or experiencing delusional thought content.  Patient denies suicidal/self-harm/homicidal ideation, psychosis, and paranoia.  Patient has remained calm throughout assessment and has answered questions appropriately.   Stay Summary:   Total Time spent with patient: 15 minutes  Past Psychiatric History:  Past Medical History:  Family History:  Family Psychiatric History:  Social History:  Tobacco Cessation:  N/A, patient does not currently use tobacco products  Current Medications:  Current Facility-Administered Medications  Medication Dose Route Frequency Provider Last Rate Last Admin   acetaminophen (TYLENOL) tablet 650 mg  650 mg Oral Q6H PRN Oneta Rack, NP       alum & mag hydroxide-simeth (MAALOX/MYLANTA) 200-200-20 MG/5ML suspension 30 mL  30 mL Oral Q4H PRN Oneta Rack, NP       chlordiazePOXIDE  (LIBRIUM) capsule 25 mg  25 mg Oral Q6H PRN Lamar Sprinkles, MD       chlordiazePOXIDE (LIBRIUM) capsule 25 mg  25 mg Oral QID Lamar Sprinkles, MD   25 mg at 05/07/23 1116   Followed by   Melene Muller ON 05/08/2023] chlordiazePOXIDE (LIBRIUM) capsule 25 mg  25 mg Oral TID Lamar Sprinkles, MD       Followed by   Melene Muller ON 05/09/2023] chlordiazePOXIDE (LIBRIUM) capsule 25 mg  25 mg Oral Alvy Beal, MD       Followed by   Melene Muller ON 05/10/2023] chlordiazePOXIDE (LIBRIUM) capsule 25 mg  25 mg Oral Daily Lamar Sprinkles, MD       gabapentin (NEURONTIN) capsule 200 mg  200 mg Oral BID Oneta Rack, NP   200 mg at 05/07/23 1116   hydrOXYzine (ATARAX) tablet 25 mg  25 mg Oral Q6H PRN Oneta Rack, NP       loperamide (IMODIUM) capsule 2-4 mg  2-4 mg Oral PRN Oneta Rack, NP       magnesium hydroxide (MILK OF MAGNESIA) suspension 30 mL  30 mL Oral Daily PRN Oneta Rack, NP       multivitamin with minerals tablet 1 tablet  1 tablet Oral Daily Kandise Riehle N, NP       ondansetron (ZOFRAN-ODT) disintegrating tablet 4 mg  4 mg Oral Q6H PRN Oneta Rack, NP       pantoprazole (PROTONIX) EC tablet 40 mg  40 mg Oral Daily Lamar Sprinkles, MD   40 mg at 05/07/23 1202   [START ON 05/08/2023] thiamine (VITAMIN B1) tablet 100 mg  100 mg Oral Daily Oneta Rack, NP  Current Outpatient Medications  Medication Sig Dispense Refill   gabapentin (NEURONTIN) 300 MG capsule Take 1 capsule (300 mg total) by mouth 3 (three) times daily. 90 capsule 1   omeprazole (PRILOSEC) 20 MG capsule Take 1 capsule (20 mg total) by mouth daily. 30 capsule 0   sucralfate (CARAFATE) 1 g tablet Take 1 tablet (1 g total) by mouth 4 (four) times daily -  with meals and at bedtime. 120 tablet 0    PTA Medications:  Facility Ordered Medications  Medication   acetaminophen (TYLENOL) tablet 650 mg   alum & mag hydroxide-simeth (MAALOX/MYLANTA) 200-200-20 MG/5ML suspension 30 mL   magnesium hydroxide (MILK OF  MAGNESIA) suspension 30 mL   gabapentin (NEURONTIN) capsule 200 mg   [START ON 05/08/2023] thiamine (VITAMIN B1) tablet 100 mg   multivitamin with minerals tablet 1 tablet   hydrOXYzine (ATARAX) tablet 25 mg   loperamide (IMODIUM) capsule 2-4 mg   ondansetron (ZOFRAN-ODT) disintegrating tablet 4 mg   chlordiazePOXIDE (LIBRIUM) capsule 25 mg   chlordiazePOXIDE (LIBRIUM) capsule 25 mg   Followed by   Melene Muller ON 05/08/2023] chlordiazePOXIDE (LIBRIUM) capsule 25 mg   Followed by   Melene Muller ON 05/09/2023] chlordiazePOXIDE (LIBRIUM) capsule 25 mg   Followed by   Melene Muller ON 05/10/2023] chlordiazePOXIDE (LIBRIUM) capsule 25 mg   pantoprazole (PROTONIX) EC tablet 40 mg   PTA Medications  Medication Sig   gabapentin (NEURONTIN) 300 MG capsule Take 1 capsule (300 mg total) by mouth 3 (three) times daily.   sucralfate (CARAFATE) 1 g tablet Take 1 tablet (1 g total) by mouth 4 (four) times daily -  with meals and at bedtime.   omeprazole (PRILOSEC) 20 MG capsule Take 1 capsule (20 mg total) by mouth daily.       05/07/2023   11:26 AM 05/07/2023    7:19 AM 05/06/2023   10:37 AM  Depression screen PHQ 2/9  Decreased Interest 2 2 1   Down, Depressed, Hopeless 1 1 1   PHQ - 2 Score 3 3 2   Altered sleeping 2 2 1   Tired, decreased energy 1 1 1   Change in appetite 1 1 1   Feeling bad or failure about yourself  1 1 2   Trouble concentrating 1 1 1   Moving slowly or fidgety/restless 1 1 1   Suicidal thoughts 1 1 2   PHQ-9 Score 11 11 11   Difficult doing work/chores Very difficult Very difficult Very difficult    Flowsheet Row ED from 05/07/2023 in [facility] Most recent reading at 05/07/2023 12:33 PM ED from 05/07/2023 in [facility] Most recent reading at 05/07/2023  8:12 AM ED from 05/06/2023 in [facility] Emergency Department [facility] Most recent reading at 05/06/2023  3:58 PM  C-SSRS RISK CATEGORY No Risk No Risk No Risk        Musculoskeletal  Strength & Muscle Tone: within normal limits Gait & Station: normal Patient leans: N/A  Psychiatric Specialty Exam  Presentation  General Appearance:  Casual  Eye Contact: Fleeting  Speech: Clear and Coherent  Speech Volume: Normal  Handedness: Right   Mood and Affect  Mood: Anxious  Affect: Blunt   Thought Process  Thought Processes: Coherent  Descriptions of Associations:Intact  Orientation:Full (Time, Place and Person)  Thought Content:Logical  Diagnosis of Schizophrenia or Schizoaffective disorder in past: No  Duration of Psychotic Symptoms: Greater than six months   Hallucinations:Hallucinations: None  Ideas of Reference:None  Suicidal Thoughts:Suicidal Thoughts: Yes, Passive SI Passive Intent  and/or Plan: With Intent  Homicidal Thoughts:Homicidal Thoughts: No   Sensorium  Memory: Immediate Fair; Recent Fair; Remote Fair  Judgment: Fair  Insight: Fair   Chartered certified accountant: Fair  Attention Span: Fair  Recall: [employer] of Knowledge: Fair  Language: Fair   Psychomotor Activity  Psychomotor Activity: Psychomotor Activity: Restlessness   Assets  Assets: Manufacturing systems engineer; Desire for Improvement; Physical Health   Sleep  Sleep: Sleep: Poor Number of Hours of Sleep: 0   Nutritional Assessment (For OBS and FBC admissions only) Has the patient had a weight loss or gain of 10 pounds or more in the last 3 months?: Yes Has the patient had a decrease in food intake/or appetite?: Yes Does the patient have dental problems?: No Does the patient have eating habits or behaviors that may be indicators of an eating disorder including binging or inducing vomiting?: No Has the patient recently lost weight without trying?: 1 Has the patient been eating poorly because of a decreased appetite?: 1 Malnutrition Screening Tool Score: 2    Physical Exam  Physical Exam Vitals and nursing  note reviewed.  Cardiovascular:     Rate and Rhythm: Normal rate and regular rhythm.  Pulmonary:     Effort: Pulmonary effort is normal.     Breath sounds: Normal breath sounds.  Psychiatric:        Mood and Affect: Mood normal.        Behavior: Behavior normal.        Thought Content: Thought content normal.    Review of Systems  Psychiatric/Behavioral:  Positive for depression and substance abuse. The patient is nervous/anxious.   All other systems reviewed and are negative.  Blood pressure (!) 158/86, pulse (!) 103, temperature 99.5 F (37.5 C), temperature source Oral, resp. rate 17, SpO2 98%. There is no height or weight on file to calculate BMI.  Demographic Factors:  Male and Unemployed  Loss Factors: Financial problems/change in socioeconomic status  Historical Factors: Family history of mental illness or substance abuse  Risk Reduction Factors:   NA  Continued Clinical Symptoms:  Alcohol/Substance Abuse/Dependencies  Cognitive Features That Contribute To Risk:  Closed-mindedness    Suicide Risk:  Minimal: No identifiable suicidal ideation.  Patients presenting with no risk factors but with morbid ruminations; may be classified as minimal risk based on the severity of the depressive symptoms    Disposition: Patient to be accepted and transferred to [facility].  Oneta Rack, NP 05/07/2023, 12:47 PM

## 2023-05-07 NOTE — ED Notes (Signed)
Hiccups continue.  MD cosby aware.

## 2023-05-07 NOTE — Discharge Instructions (Addendum)
[facility] [address], [place], [address] [phone] phone  New Patient Assessment/Therapy Walk-Ins:  Monday and Wednesday: 8 am until slots are full. Every 1st and 2nd Fridays of the month: 1 pm - 5 pm.  NO ASSESSMENT/THERAPY WALK-INS ON TUESDAYS OR THURSDAYS  New Patient Assessment/Medication Management Walk-Ins:  Monday - Friday:  8 am - 11 am.  For all walk-ins, we ask that you arrive by 7:30 am because patients will be seen in the order of arrival.  Availability is limited; therefore, you may not be seen on the same day that you walk-in.  Our goal is to serve and meet the needs of our community to the best of our ability.  Substance Use Resources for follow up:   [employer] [address] [place] [address] [phone]  [facility] [facility] [employer] [phone]  [employer]oxford[employer].com  [facility] [facility] [employer]  [employer]  [facility] [facility] [employer] [address] [place], [place]  [address] [phone]  [facility] [address], [place], [place] [address] [phone]  [facility] [address] [place], [place]  [address] [phone]  [facility] [address], [place], [place] [address] [phone]

## 2023-05-07 NOTE — ED Notes (Signed)
No issues or distress.  Patient ate lunch and returned to bed where he remains asleep.  Tolerating librium taper.  Hiccups continue and patient is ordered thorazine.  Will monitor and provide safe environment.

## 2023-05-07 NOTE — ED Notes (Signed)
Patient resting with no sxs of distress noted - will continue to monitor for safety 

## 2023-05-07 NOTE — ED Provider Notes (Signed)
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 % 01/24/2023 48  % Final   Neutro Abs 01/24/2023 2.2  1.7 - 7.7 K/uL Final   Lymphocytes Relative 01/24/2023 39  % Final   Lymphs Abs 01/24/2023 1.8  0.7 - 4.0 K/uL Final    Monocytes Relative 01/24/2023 9  % Final   Monocytes Absolute 01/24/2023 0.4  0.1 - 1.0 K/uL Final   Eosinophils Relative 01/24/2023 3  % Final   Eosinophils Absolute 01/24/2023 0.1  0.0 - 0.5 K/uL Final   Basophils Relative 01/24/2023 1  % Final   Basophils Absolute 01/24/2023 0.1  0.0 - 0.1 K/uL Final   Immature Granulocytes 01/24/2023 0  % Final   Abs Immature Granulocytes 01/24/2023 0.00  0.00 - 0.07 K/uL Final   Performed at [facility], [address], [place], [place] [address]   Prothrombin Time 01/24/2023 13.8  11.4 - 15.2 seconds Final   INR 01/24/2023 1.1  0.8 - 1.2 Final   Comment: (NOTE) INR goal varies based on device and disease states. Performed at [facility], [address] [place], [place] [address]    ABO/RH(D) 01/24/2023 O POS   Final   Antibody Screen 01/24/2023 NEG   Final   Sample Expiration 01/24/2023    Final                   Value:01/27/2023,2359 Performed at [facility], [address] [place], [place] [address]    Opiates 01/24/2023 NONE DETECTED  NONE DETECTED Final   Cocaine 01/24/2023 NONE DETECTED  NONE DETECTED Final   Benzodiazepines 01/24/2023 NONE DETECTED  NONE DETECTED Final   Amphetamines 01/24/2023 NONE DETECTED  NONE DETECTED Final   Tetrahydrocannabinol 01/24/2023 NONE DETECTED  NONE DETECTED Final   Barbiturates 01/24/2023 NONE DETECTED  NONE DETECTED Final   Comment: (NOTE) DRUG SCREEN FOR MEDICAL PURPOSES ONLY.  IF CONFIRMATION IS NEEDED FOR ANY PURPOSE, NOTIFY LAB WITHIN 5 DAYS.  LOWEST DETECTABLE LIMITS FOR URINE DRUG SCREEN Drug Class                     Cutoff (ng/mL) Amphetamine and metabolites    1000 Barbiturate and metabolites    200 Benzodiazepine                 200 Opiates and metabolites        300 Cocaine and metabolites        300 THC                            50 Performed at [facility], [address] [place], [place]  [address]   Admission on 01/09/2023, Discharged on 01/09/2023  Component Date Value Ref Range Status   Sodium 01/09/2023 135  135 - 145 mmol/L Final   Potassium 01/09/2023 3.8  3.5 - 5.1 mmol/L Final   Chloride 01/09/2023 97 (L)  98 - 111 mmol/L Final   CO2 01/09/2023 27  22 - 32 mmol/L Final   Glucose, Bld 01/09/2023 101 (H)  70 - 99 mg/dL Final   Glucose reference range applies only to samples taken after fasting for at least 8 hours.  BUN 01/09/2023 7 (L)  8 - 23 mg/dL Final   Creatinine, Ser 01/09/2023 0.77  0.61 - 1.24 mg/dL Final   Calcium 16/07/9603 8.9  8.9 - 10.3 mg/dL Final   Total Protein 54/06/8118 8.5 (H)  6.5 - 8.1 g/dL Final   Albumin 14/78/2956 3.9  3.5 - 5.0 g/dL Final   AST 21/30/8657 91 (H)  15 - 41 U/L Final   ALT 01/09/2023 69 (H)  0 - 44 U/L Final   Alkaline Phosphatase 01/09/2023 79  38 - 126 U/L Final   Total Bilirubin 01/09/2023 2.1 (H)  0.3 - 1.2 mg/dL Final   GFR, Estimated 01/09/2023 >60  >60 mL/min Final   Comment: (NOTE) Calculated using the CKD-EPI Creatinine Equation (2021)    Anion gap 01/09/2023 11  5 - 15 Final   Performed at [facility], [address] [place], [place] [address]   Alcohol, Ethyl (B) 01/09/2023 258 (H)  <10 mg/dL Final   Comment: (NOTE) Lowest detectable limit for serum alcohol is 10 mg/dL.  For medical purposes only. Performed at [facility], [address], [place], [place] [address]    Salicylate Lvl 01/09/2023 <7.0 (L)  7.0 - 30.0 mg/dL Final   Performed at [facility], [address], [place], [place] [address]   Acetaminophen (Tylenol), Serum 01/09/2023 <10 (L)  10 - 30 ug/mL Final   Comment: (NOTE) Therapeutic concentrations vary significantly. A range of 10-30 ug/mL  may be an effective concentration for many patients. However, some  are best treated at concentrations outside of this range. Acetaminophen concentrations >150 ug/mL at 4 hours after ingestion   and >50 ug/mL at 12 hours after ingestion are often associated with  toxic reactions.  Performed at [facility], [address], [place], [place] [address]    WBC 01/09/2023 4.3  4.0 - 10.5 K/uL Final   RBC 01/09/2023 5.64  4.22 - 5.81 MIL/uL Final   Hemoglobin 01/09/2023 13.2  13.0 - 17.0 g/dL Final   HCT 72/53/6644 43.0  39.0 - 52.0 % Final   MCV 01/09/2023 76.2 (L)  80.0 - 100.0 fL Final   MCH 01/09/2023 23.4 (L)  26.0 - 34.0 pg Final   MCHC 01/09/2023 30.7  30.0 - 36.0 g/dL Final   RDW 03/47/4259 18.7 (H)  11.5 - 15.5 % Final   Platelets 01/09/2023 96 (L)  150 - 400 K/uL Final   Comment: SPECIMEN CHECKED FOR CLOTS REPEATED TO VERIFY PLATELET COUNT CONFIRMED BY SMEAR    nRBC 01/09/2023 0.0  0.0 - 0.2 % Final   Performed at [facility], [address] [place], [place] [address]   Opiates 01/09/2023 NONE DETECTED  NONE DETECTED Final   Cocaine 01/09/2023 NONE DETECTED  NONE DETECTED Final   Benzodiazepines 01/09/2023 NONE DETECTED  NONE DETECTED Final   Amphetamines 01/09/2023 NONE DETECTED  NONE DETECTED Final   Tetrahydrocannabinol 01/09/2023 NONE DETECTED  NONE DETECTED Final   Barbiturates 01/09/2023 NONE DETECTED  NONE DETECTED Final   Comment: (NOTE) DRUG SCREEN FOR MEDICAL PURPOSES ONLY.  IF CONFIRMATION IS NEEDED FOR ANY PURPOSE, NOTIFY LAB WITHIN 5 DAYS.  LOWEST DETECTABLE LIMITS FOR URINE DRUG SCREEN Drug Class                     Cutoff (ng/mL) Amphetamine and metabolites    1000 Barbiturate and metabolites    200 Benzodiazepine  200 Opiates and metabolites        300 Cocaine and metabolites        300 THC                            50 Performed at [facility], [address], [place], [place] [address]    SARS Coronavirus 2 by RT PCR 01/09/2023 NEGATIVE  NEGATIVE Final   Comment: (NOTE) SARS-CoV-2 target nucleic acids are NOT DETECTED.  The SARS-CoV-2 RNA is generally detectable  in upper respiratory specimens during the acute phase of infection. The lowest concentration of SARS-CoV-2 viral copies this assay can detect is 138 copies/mL. A negative result does not preclude SARS-Cov-2 infection and should not be used as the sole basis for treatment or other patient management decisions. A negative result may occur with  improper specimen collection/handling, submission of specimen other than nasopharyngeal swab, presence of viral mutation(s) within the areas targeted by this assay, and inadequate number of viral copies(<138 copies/mL). A negative result must be combined with clinical observations, patient history, and epidemiological information. The expected result is Negative.  Fact Sheet for Patients:  BloggerCourse.com  Fact Sheet for Healthcare Providers:  SeriousBroker.it  This test is no                          t yet approved or cleared by the [place] FDA and  has been authorized for detection and/or diagnosis of SARS-CoV-2 by FDA under an Emergency Use Authorization (EUA). This EUA will remain  in effect (meaning this test can be used) for the duration of the COVID-19 declaration under Section 564(b)(1) of the Act, 21 U.S.C.section 360bbb-3(b)(1), unless the authorization is terminated  or revoked sooner.       Influenza A by PCR 01/09/2023 NEGATIVE  NEGATIVE Final   Influenza B by PCR 01/09/2023 NEGATIVE  NEGATIVE Final   Comment: (NOTE) The Xpert Xpress SARS-CoV-2/FLU/RSV plus assay is intended as an aid in the diagnosis of influenza from Nasopharyngeal swab specimens and should not be used as a sole basis for treatment. Nasal washings and aspirates are unacceptable for Xpert Xpress SARS-CoV-2/FLU/RSV testing.  Fact Sheet for Patients: BloggerCourse.com  Fact Sheet for Healthcare Providers: SeriousBroker.it  This test is not yet  approved or cleared by the [place] FDA and has been authorized for detection and/or diagnosis of SARS-CoV-2 by FDA under an Emergency Use Authorization (EUA). This EUA will remain in effect (meaning this test can be used) for the duration of the COVID-19 declaration under Section 564(b)(1) of the Act, 21 U.S.C. section 360bbb-3(b)(1), unless the authorization is terminated or revoked.     Resp Syncytial Virus by PCR 01/09/2023 NEGATIVE  NEGATIVE Final   Comment: (NOTE) Fact Sheet for Patients: BloggerCourse.com  Fact Sheet for Healthcare Providers: SeriousBroker.it  This test is not yet approved or cleared by the [place] FDA and has been authorized for detection and/or diagnosis of SARS-CoV-2 by FDA under an Emergency Use Authorization (EUA). This EUA will remain in effect (meaning this test can be used) for the duration of the COVID-19 declaration under Section 564(b)(1) of the Act, 21 U.S.C. section 360bbb-3(b)(1), unless the authorization is terminated or revoked.  Performed at [facility], [address], [place], [place] [address]    Cholesterol 01/09/2023 174  0 - 200 mg/dL Final   Triglycerides 29/56/2130 53  <150 mg/dL Final  HDL 01/09/2023 92  >40 mg/dL Final   Total CHOL/HDL Ratio 01/09/2023 1.9  RATIO Final   VLDL 01/09/2023 11  0 - 40 mg/dL Final   LDL Cholesterol 01/09/2023 71  0 - 99 mg/dL Final   Comment:        Total Cholesterol/HDL:CHD Risk Coronary Heart Disease Risk Table                     Men   Women  1/2 Average Risk   3.4   3.3  Average Risk       5.0   4.4  2 X Average Risk   9.6   7.1  3 X Average Risk  23.4   11.0        Use the calculated Patient Ratio above and the CHD Risk Table to determine the patient's CHD Risk.        ATP III CLASSIFICATION (LDL):  <100     mg/dL   Optimal  098-119  mg/dL   Near or Above                    Optimal  130-159  mg/dL    Borderline  147-829  mg/dL   High  >562     mg/dL   Very High Performed at [facility], [address] [place], [place] [address]    TSH 01/09/2023 2.744  0.350 - 4.500 uIU/mL Final   Comment: Performed by a 3rd Generation assay with a functional sensitivity of <=0.01 uIU/mL. Performed at [facility], [address], [place], [place] [address]    Magnesium 01/09/2023 2.1  1.7 - 2.4 mg/dL Final   Performed at [facility], [address], [place], [place] [address]   Color, Urine 01/09/2023 STRAW (A)  YELLOW Final   APPearance 01/09/2023 CLEAR  CLEAR Final   Specific Gravity, Urine 01/09/2023 1.002 (L)  1.005 - 1.030 Final   pH 01/09/2023 6.0  5.0 - 8.0 Final   Glucose, UA 01/09/2023 NEGATIVE  NEGATIVE mg/dL Final   Hgb urine dipstick 01/09/2023 NEGATIVE  NEGATIVE Final   Bilirubin Urine 01/09/2023 NEGATIVE  NEGATIVE Final   Ketones, ur 01/09/2023 NEGATIVE  NEGATIVE mg/dL Final   Protein, ur 28/41/3244 NEGATIVE  NEGATIVE mg/dL Final   Nitrite 10/19/7251 NEGATIVE  NEGATIVE Final   Leukocytes,Ua 01/09/2023 NEGATIVE  NEGATIVE Final   Performed at [facility], [address] [place], [place] [address]   Prolactin 01/09/2023 27.2 (H)  3.6 - 25.2 ng/mL Final   Comment: (NOTE) Performed At: [facility] [address] [place], [place] [id number] Jolene Schimke MD LO:[id number]     Allergies: Carrot [daucus carota] and Carrot [daucus carota]  Medications:  Facility Ordered Medications  Medication   acetaminophen (TYLENOL) tablet 650 mg   alum & mag hydroxide-simeth (MAALOX/MYLANTA) 200-200-20 MG/5ML suspension 30 mL   magnesium hydroxide (MILK OF MAGNESIA) suspension 30 mL   gabapentin (NEURONTIN) capsule 200 mg   [START ON 05/08/2023] thiamine (VITAMIN B1) tablet 100 mg   multivitamin with minerals tablet 1 tablet   hydrOXYzine (ATARAX) tablet 25 mg   loperamide (IMODIUM) capsule 2-4 mg    ondansetron (ZOFRAN-ODT) disintegrating tablet 4 mg   chlordiazePOXIDE (LIBRIUM) capsule 25 mg   chlordiazePOXIDE (LIBRIUM) capsule 25 mg   Followed by   Melene Muller ON 05/08/2023] chlordiazePOXIDE (LIBRIUM) capsule 25 mg   Followed by   Melene Muller ON 05/09/2023] chlordiazePOXIDE (LIBRIUM) capsule 25 mg  Followed by   Melene Muller ON 05/10/2023] chlordiazePOXIDE (LIBRIUM) capsule 25 mg   pantoprazole (PROTONIX) EC tablet 40 mg   PTA Medications  Medication Sig   gabapentin (NEURONTIN) 300 MG capsule Take 1 capsule (300 mg total) by mouth 3 (three) times daily.   sucralfate (CARAFATE) 1 g tablet Take 1 tablet (1 g total) by mouth 4 (four) times daily -  with meals and at bedtime.   omeprazole (PRILOSEC) 20 MG capsule Take 1 capsule (20 mg total) by mouth daily.    Long Term Goals: Improvement in symptoms so as ready for discharge  Short Term Goals: Patient will verbalize feelings in meetings with treatment team members., Patient will attend at least of 50% of the groups daily., Pt will complete the PHQ9 on admission, day 3 and discharge., Patient will participate in completing the [place] Suicide Severity Rating Scale, Patient will score a low risk of violence for 24 hours prior to discharge, and Patient will take medications as prescribed daily.  Medical Decision Making  Arul Farabee is a 67 year old male who presents for admission to the [facility] for alcohol detox. Despite multiple admissions without motivation for treatment, Trajan insists on having motivation during this visit. He is open to seeking residential substance use treatment. Mood sx appear worsened in the setting of alcohol use. Will continue to assess whether appropriate to initiate an SSRI or whether improvements to his mood occur as the alcohol metabolizes. Kermitt denies being a threat to himself at this time, but will continue safety monitoring as he has a propensity for self-harm. LCSW to assist with disposition planning.     #Alcohol Use  Disorder CIWA Librium protocol initiated: - chlordiazepoxide (Librium) 25 mg 4 times daily x4 doses, 25 mg 3 times daily x3 doses,25 mg 2 times daily x2 doses, 25 mg daily x1 dose -chlordiazepoxide (LIbrium) 25 mg every 6 hours as needed for CIWA greater than 10; Hydroxyzine 25 mg for CIWA less than 10 -Multivitamin with minerals 1 tablet daily -Ondansetron disintegrating tablet 4 mg every 6 as needed/nausea or vomiting -Loperamide 2 to 4 mg oral as needed/diarrhea or loose stools -Thiamine injection 100 mg IM once -Thiamine tablet 500 mg Q8 hours  -- Continue home Gabapentin 200 mg BID. May titrate as clinically required.  #Epigastric Pain - START Protonix 40 mg (home Omeprazole equivalent) daily.  - If [name], consider also starting Carafate 1 g QID (TID w/ meals and at bedtime). Patient prescribed 24-day trial on 7/7 but did not take.  #HTN - Patient takes no anti-hypertensives at home. (Discharged on Norvasc 5 mg daily 6/17).  - Will monitor and start Norvasc 5 mg daily tomorrow if remains elevated.  #Hiccups - Thorazine 25 mg daily PRN. Patient given a dose of Thorazine in the ED 7/7 for hiccups, which provided resolve.     Recommendations  Based on my evaluation the patient does not appear to have an emergency medical condition.  Lamar Sprinkles, MD 05/07/23  12:48 PM

## 2023-05-07 NOTE — ED Notes (Signed)
Patient observed/assessed in patient room lying down asleep in bed. Patient aroused with verbal stimulation after several attempts to which patient responded in an agitated/annoyed demeanor. Patient oriented x 3. Affect is flat/agitated and eye contact is minimal. Patient evasive to preliminary assessment questions. Denies S/I and H/I. Denies A/V/H. Will continue to attempt to encourage patient to get food to eat or snacks and not solely isolate in room. Vitals sign checks was completed on pt by Clinical research associate and all are WDL. Will continue to monitor for safety and provide support.
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
## 2023-05-07 NOTE — ED Notes (Signed)
Patient is sleeping. Respirations equal and unlabored, skin warm and dry. No change in assessment or acuity. Routine safety checks conducted according to facility protocol. Will continue to monitor for safety.   

## 2023-05-07 NOTE — ED Notes (Signed)
Providers made aware of BP

## 2023-05-08 DIAGNOSIS — R0602 Shortness of breath: Secondary | ICD-10-CM | POA: Diagnosis not present

## 2023-05-08 DIAGNOSIS — F1020 Alcohol dependence, uncomplicated: Secondary | ICD-10-CM | POA: Diagnosis not present

## 2023-05-08 LAB — IRON AND TIBC
Iron: 25 ug/dL — ABNORMAL LOW (ref 45–182)
Saturation Ratios: 6 % — ABNORMAL LOW (ref 17.9–39.5)
TIBC: 413 ug/dL (ref 250–450)
UIBC: 388 ug/dL

## 2023-05-08 MED ORDER — SUCRALFATE 1 GM/10ML PO SUSP
1.0000 g | Freq: Three times a day (TID) | ORAL | Status: DC
  Administered 2023-05-08 – 2023-05-12 (×17): 1 g via ORAL
  Filled 2023-05-08 (×16): qty 10

## 2023-05-08 MED ORDER — GABAPENTIN 300 MG PO CAPS
300.0000 mg | ORAL_CAPSULE | Freq: Three times a day (TID) | ORAL | Status: DC
  Administered 2023-05-08 – 2023-05-12 (×13): 300 mg via ORAL
  Filled 2023-05-08 (×13): qty 1

## 2023-05-08 NOTE — ED Notes (Signed)
Patient observed/assessed at bedside lying in bed asleep. Patient alert and oriented to self and location. Affect is flat and patient seems agitated to be disturbed from sleeping. Patient denies pain and anxiety. He denies A/V/H. He denies having any thoughts/plan of self harm and harm towards others. Fluid offered. Patient states that appetite has been good throughout the day. Verbalizes no further complaints at this time but having some tremors. Will continue to monitor and support.

## 2023-05-08 NOTE — ED Notes (Signed)
Patient took a shower this morning and was given clean scrubs and a pair of shorts and a shirt from donation.  He continues to have withdrawal symptoms of tremors and anxiety.  He was given vistaril and thorazine with good effect.  He ate lunch and is now resting in bed.  Will monitor.

## 2023-05-08 NOTE — ED Notes (Signed)
Patient awake this morning with irritable mood and concrete thought process.  Patient remains disheveled and malodorous.  Patient has tremors of hands and stomach discomfort.  MD aware.  Patient is indecisive about rehab and his motivation may be related to housing.  Patient seems to have some insight into the severity of his social issues and the seriousness of his etoh use and how it is affecting his overall health.  Motivation for change is poor.  Will monitor and assist with ADL's shortly.

## 2023-05-08 NOTE — ED Notes (Signed)
Patient is sleeping. Respirations equal and unlabored, skin warm and dry. No change in assessment or acuity. Routine safety checks conducted according to facility protocol. Will continue to monitor for safety.   

## 2023-05-08 NOTE — Tx Team (Signed)
Patient is known to this provider. LCSW, MD, and Resident met with patient to assess current mood, affect, physical state, and inquire about needs/goals while here in [facility] and after discharge. Per admission note, "Pt is a 67 yo male who presented via [employer] requesting detox from alcohol. Pt stated that he last drank alcohol about 30 minutes before he came to [facility]. Pt reported SI and stated "if I had a gun I'd shoot myself." Pt reported he did not have access to a gun or firearms. Pt denied HI, NSSH and any other substance use other than alcohol consumption. Pt stated he has tried to kill himself once "15 years ago" by cutting himself on his forearm. Pt state that he has been inpatient in a psychiatric hospital with the last time occurring in 1996.Pt has been seen in the ED 8 times since 01/09/2023 with multiple reports of SI but no reported attempts. Pt reported he is experiencing VH and seeing "all kinds of things." Pt stated he does not know if he is only having VH when he is intoxicated because he stated "I'm drinking all the time." Pt reported drinking alcohol daily in excessive amounts". Patient reports his current goal is to seek residential placement for himself. Patient has provided permission for LCSW to send his clinicals out for review. LCSW will follow up with patient once update has been provided. No other needs were reported at this time.   LCSW will send updates clinicals to [facility] for review and [facility] in [place], [place].   Fernande Boyden, LCSW Clinical Social Worker [facility] Ph: [phone]
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
## 2023-05-08 NOTE — ED Notes (Signed)
Patient passively refused to attend group session after multiple attempts of encouragement by staff. Patient continued to sleep in room.

## 2023-05-08 NOTE — ED Notes (Signed)
Call placed to [facility] for specimen pick-up

## 2023-05-08 NOTE — ED Notes (Signed)
Pt complained of back pain of 4/10, pain medication administered to that effect.

## 2023-05-09 DIAGNOSIS — F1020 Alcohol dependence, uncomplicated: Secondary | ICD-10-CM | POA: Diagnosis not present

## 2023-05-09 DIAGNOSIS — R0602 Shortness of breath: Secondary | ICD-10-CM | POA: Diagnosis not present

## 2023-05-09 LAB — URINALYSIS, W/ REFLEX TO CULTURE (INFECTION SUSPECTED)
Bilirubin Urine: NEGATIVE
Glucose, UA: NEGATIVE mg/dL
Hgb urine dipstick: NEGATIVE
Ketones, ur: NEGATIVE mg/dL
Nitrite: POSITIVE — AB
Protein, ur: 30 mg/dL — AB
Specific Gravity, Urine: 1.011 (ref 1.005–1.030)
WBC, UA: >50 WBC/hpf (ref 0–5)
pH: 5 (ref 5.0–8.0)

## 2023-05-09 MED ORDER — TRAZODONE HCL 50 MG PO TABS
50.0000 mg | ORAL_TABLET | Freq: Every evening | ORAL | Status: DC | PRN
  Administered 2023-05-09: 50 mg via ORAL
  Filled 2023-05-09 (×2): qty 1
  Filled 2023-05-09: qty 3

## 2023-05-09 MED ORDER — MELATONIN 3 MG PO TABS
3.0000 mg | ORAL_TABLET | Freq: Every day | ORAL | Status: DC
  Administered 2023-05-09 – 2023-05-11 (×3): 3 mg via ORAL
  Filled 2023-05-09 (×3): qty 1

## 2023-05-09 MED ORDER — CEFDINIR 300 MG PO CAPS
300.0000 mg | ORAL_CAPSULE | Freq: Two times a day (BID) | ORAL | Status: DC
  Administered 2023-05-09 – 2023-05-12 (×7): 300 mg via ORAL
  Filled 2023-05-09 (×3): qty 1
  Filled 2023-05-09: qty 6
  Filled 2023-05-09 (×4): qty 1

## 2023-05-09 MED ORDER — FERROUS SULFATE 325 (65 FE) MG PO TABS
325.0000 mg | ORAL_TABLET | Freq: Every day | ORAL | Status: DC
  Administered 2023-05-09 – 2023-05-12 (×4): 325 mg via ORAL
  Filled 2023-05-09 (×2): qty 1
  Filled 2023-05-09: qty 3
  Filled 2023-05-09 (×3): qty 1
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
## 2023-05-09 NOTE — ED Notes (Signed)
Patient observed/assessed at bedside lying in bed asleep. Patient alert and oriented to self and location. Affect is blank. Patient denies pain and anxiety. He denies A/V/H. He denies having any thoughts/plan of self harm and harm towards others. Fluid and snack offered. Patient states that appetite has been good throughout the day. Verbalizes no further complaints at this time. Will continue to monitor and support.
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
## 2023-05-09 NOTE — Progress Notes (Signed)
Pt's CIWA was 9.

## 2023-05-09 NOTE — ED Notes (Signed)
Patient observed/assessed in room in bed appearing in no immediate distress resting peacefully. Q15 minute checks continued by MHT and nursing staff. Will continue to monitor and support. 

## 2023-05-09 NOTE — Progress Notes (Signed)
Pt complained of headache 7/10. PRN Acetaminophen was administered. Will continue to monitor.
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
## 2023-05-09 NOTE — Progress Notes (Signed)
Pt is awake, alert and oriented X4. Pt complained of abdominal pain. No signs of acute distress noted. Administered scheduled meds with no issue. Pt denies current SI/HI/AVH, plan or intent. Staff will monitor for pt's safety.

## 2023-05-09 NOTE — Progress Notes (Signed)
Pt's CIWA was 3
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
## 2023-05-10 DIAGNOSIS — R0602 Shortness of breath: Secondary | ICD-10-CM | POA: Diagnosis not present

## 2023-05-10 DIAGNOSIS — F1020 Alcohol dependence, uncomplicated: Secondary | ICD-10-CM | POA: Diagnosis not present

## 2023-05-10 LAB — URINE CULTURE

## 2023-05-10 MED ORDER — ACAMPROSATE CALCIUM 333 MG PO TBEC
666.0000 mg | DELAYED_RELEASE_TABLET | Freq: Three times a day (TID) | ORAL | Status: DC
  Administered 2023-05-10 – 2023-05-12 (×7): 666 mg via ORAL
  Filled 2023-05-10 (×4): qty 2
  Filled 2023-05-10: qty 18
  Filled 2023-05-10: qty 2
  Filled 2023-05-10: qty 36
  Filled 2023-05-10 (×3): qty 2

## 2023-05-10 MED ORDER — SERTRALINE HCL 25 MG PO TABS
25.0000 mg | ORAL_TABLET | Freq: Every day | ORAL | Status: DC
  Administered 2023-05-10 – 2023-05-12 (×3): 25 mg via ORAL
  Filled 2023-05-10: qty 1
  Filled 2023-05-10: qty 3
  Filled 2023-05-10 (×2): qty 1

## 2023-05-10 MED ORDER — AMLODIPINE BESYLATE 5 MG PO TABS
5.0000 mg | ORAL_TABLET | Freq: Every day | ORAL | Status: DC
  Administered 2023-05-10 – 2023-05-12 (×3): 5 mg via ORAL
  Filled 2023-05-10 (×2): qty 1
  Filled 2023-05-10: qty 3
  Filled 2023-05-10: qty 1

## 2023-05-10 MED ORDER — ENSURE ENLIVE PO LIQD
237.0000 mL | Freq: Two times a day (BID) | ORAL | Status: DC
  Administered 2023-05-10 – 2023-05-12 (×6): 237 mL via ORAL

## 2023-05-10 NOTE — ED Notes (Signed)
Pt sleeping@this  time.breathing eve and unlabored.will continue to monitor for safety
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
## 2023-05-10 NOTE — Progress Notes (Signed)
Pt's CIWA was 6. °

## 2023-05-10 NOTE — ED Notes (Signed)
Patient was provided dinner
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
## 2023-05-10 NOTE — ED Notes (Signed)
Patient was given breakfast.  

## 2023-05-10 NOTE — Progress Notes (Signed)
Pt's CIWA was 5. °

## 2023-05-10 NOTE — Discharge Planning (Signed)
LCSW went and spoke with the patient to inform him of the two options available for placement. Patient was informed of update provided by [facility] regarding them being able to consider him, however they will not provide transport as this was previously covered and he did not show up. Patient reports he does not want to go to [facility] stating he does not know how he will get back to [place] once treatment is complete. LCSW encouraged him to follow up with the facility regarding resources available and patient declined. LCSW informed the patient that he is currently being reviewed by [facility] in [place], [place] however LCSW is waiting on update regarding updated clinicals sent related to liver cirrhosis. Patient reports he would go to [place] if accepted as he really needs treatment at this time. LCSW informed him of limited options due to [employer]. Patient expressed understanding and is aware that LCSW will provide updates once received. No other needs were reported by the patient.   8:25am: LCSW attempted to contact [facility] this morning for updates, however received no answer. LCSW was unable to leave voice message as the phone just continued to ring. LCSW will continue to follow up for updates.   Fernande Boyden, LCSW Clinical Social Worker [facility] Ph: [phone]

## 2023-05-10 NOTE — ED Notes (Signed)
Patient denies SI/HI and AVH. Patient has received his medications this morning. Patient has ate breakfast and is lying down in bed.

## 2023-05-10 NOTE — ED Notes (Signed)
Pt is laying in bed calm and cooperative no pain or distress noted will continue  to monitor for safety
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
## 2023-05-10 NOTE — ED Notes (Signed)
Patient was given lunch. 

## 2023-05-10 NOTE — ED Notes (Signed)
Pt refused AA and Group tonight.
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
## 2023-05-10 NOTE — ED Notes (Signed)
Patient is sleeping. Respirations equal and unlabored, skin warm and dry. No change in assessment or acuity. Routine safety checks conducted according to facility protocol. Will continue to monitor for safety.   

## 2023-05-11 DIAGNOSIS — R0602 Shortness of breath: Secondary | ICD-10-CM | POA: Diagnosis not present

## 2023-05-11 DIAGNOSIS — F1020 Alcohol dependence, uncomplicated: Secondary | ICD-10-CM | POA: Diagnosis not present

## 2023-05-11 LAB — URINE CULTURE: Culture: >=100000 — AB

## 2023-05-11 MED ORDER — QUETIAPINE FUMARATE 50 MG PO TABS
50.0000 mg | ORAL_TABLET | Freq: Every day | ORAL | Status: DC
  Administered 2023-05-11: 50 mg via ORAL
  Filled 2023-05-11: qty 3
  Filled 2023-05-11: qty 1

## 2023-05-11 NOTE — ED Notes (Signed)
Patient in milieu. Environment is secured. Will continue to monitor for safety. 

## 2023-05-11 NOTE — ED Notes (Signed)
Patient alert and oriented x 3. Denies SI/HI/AVH. Denies intent or plan to harm self or others. Routine conducted according to faculty protocol. Encourage patient to notify staff with any needs or concerns. Patient verbalized agreement and understanding. Will continue to monitor for safety. 

## 2023-05-11 NOTE — ED Notes (Signed)
Patient requested medication for  headache. 
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
## 2023-05-11 NOTE — ED Notes (Signed)
Patient is watching tv in the dayroom. Environment secured. Alert and oriented. Will continue to monitor for safety.
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
## 2023-05-11 NOTE — ED Notes (Signed)
Provider states that she will be over shortly to see patient . Patent denies any blurred vision or chest pain. Will continue to monitor for safety.
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
## 2023-05-11 NOTE — ED Notes (Signed)
Patient is sleeping. Respirations equal and unlabored, skin warm and dry. No change in assessment or acuity. Routine safety checks conducted according to facility protocol. Will continue to monitor for safety.   

## 2023-05-11 NOTE — ED Notes (Signed)
Patient had tyenol at 1337 and is requesting tylenol again ,but its to early,so he is requested ibuprofen. His blood pressure was 175/81 first time and 169/74 pulse 88 second time . Rn notified provider waiting on response.

## 2023-05-11 NOTE — ED Notes (Signed)
Patient observed/assessed at nursing station. Patient alert and oriented x 4. Affect is bright. Patient complain of headache and back  pain and denies anxiety. Tylenol 650mg  is administered for pain. He denies A/V/H. He denies having any thoughts/plan of self harm and harm towards others. Fluid and snack offered. Patient states that appetite has been good throughout the day. Verbalizes no further complaints at this time. Will continue to monitor and support.
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
## 2023-05-11 NOTE — Care Management (Addendum)
Care Management   Writer faxed another copy of the 04-10-2023 CT scan to [facility] (Fax: [phone]).    2:30pm  Writer met with patient.  Writer coordinated for the patient to have a phone intake screening with [facility].   Per worker at [employer], his phone screening assessment will have to be reviewed by the Clinical Team and they will contact me once a decision for placement has been made.

## 2023-05-11 NOTE — ED Notes (Signed)
Patient is in room . Environment secure. Will continue to monitor for safety.

## 2023-05-11 NOTE — Care Management (Signed)
Care Management   [facility] informed me that the patient has been accepted to their facility and is able to come tomorrow pending the coordination of transportation.   The transportation coordinator will be calling me back to confirm the time that they will be able to pick up the patient tomorrow Friday 05-12-2023.

## 2023-05-11 NOTE — ED Provider Notes (Signed)
This provider to unit to assess patient due to RN's report of elevated BP and patient requesting Tylenol or Ibuprofen for headache. Patient sitting upright on couch by nurse's station, no signs of acute distress noted. Provider checked patient's BP manually: 148/78. Respirations even and unlabored. Patient reports no physical complaints at this time. Advised nursing staff PRN Tylenol may be given now as it has been 6 hours since last PRN dose. Patient appreciative and verbalizes no further concerns at this time.

## 2023-05-12 DIAGNOSIS — R0602 Shortness of breath: Secondary | ICD-10-CM | POA: Diagnosis not present

## 2023-05-12 DIAGNOSIS — F1020 Alcohol dependence, uncomplicated: Secondary | ICD-10-CM | POA: Diagnosis not present

## 2023-05-12 MED ORDER — SERTRALINE HCL 25 MG PO TABS: 25.0000 mg | ORAL_TABLET | Freq: Every day | ORAL | 0 refills | Status: DC

## 2023-05-12 MED ORDER — FERROUS SULFATE 325 (65 FE) MG PO TABS: 325.0000 mg | ORAL_TABLET | Freq: Every day | ORAL | 0 refills | Status: DC

## 2023-05-12 MED ORDER — VITAMIN B-1 100 MG PO TABS: 100.0000 mg | ORAL_TABLET | Freq: Every day | ORAL | 0 refills | Status: AC

## 2023-05-12 MED ORDER — TRAZODONE HCL 50 MG PO TABS: 50.0000 mg | ORAL_TABLET | Freq: Every evening | ORAL | 0 refills | Status: DC | PRN

## 2023-05-12 MED ORDER — QUETIAPINE FUMARATE 50 MG PO TABS
50.0000 mg | ORAL_TABLET | Freq: Once | ORAL | Status: AC
  Administered 2023-05-12: 50 mg via ORAL
  Filled 2023-05-12: qty 1

## 2023-05-12 MED ORDER — ADULT MULTIVITAMIN W/MINERALS CH: 1.0000 | ORAL_TABLET | Freq: Every day | ORAL | Status: DC

## 2023-05-12 MED ORDER — QUETIAPINE FUMARATE 50 MG PO TABS: 50.0000 mg | ORAL_TABLET | Freq: Every day | ORAL | 0 refills | Status: DC

## 2023-05-12 MED ORDER — CEFDINIR 300 MG PO CAPS: 300.0000 mg | ORAL_CAPSULE | Freq: Two times a day (BID) | ORAL | 0 refills | Status: AC

## 2023-05-12 MED ORDER — OMEPRAZOLE 20 MG PO CPDR: 20.0000 mg | DELAYED_RELEASE_CAPSULE | Freq: Every day | ORAL | 0 refills | Status: DC

## 2023-05-12 MED ORDER — AMLODIPINE BESYLATE 5 MG PO TABS: 5.0000 mg | ORAL_TABLET | Freq: Every day | ORAL | 0 refills | Status: DC

## 2023-05-12 MED ORDER — ACAMPROSATE CALCIUM 333 MG PO TBEC: 666.0000 mg | DELAYED_RELEASE_TABLET | Freq: Three times a day (TID) | ORAL | 0 refills | Status: AC

## 2023-05-12 MED ORDER — MELATONIN 3 MG PO TABS: 3.0000 mg | ORAL_TABLET | Freq: Every day | ORAL | Status: AC

## 2023-05-12 NOTE — ED Notes (Signed)
Patient is discharge to [facility] given bus voucher medication and scripts. Patient refused a meal that we were trying to give himPPatient A&O x 4, ambulatory. Patient discharged in no acute distress. Patient denied SI/HI, A/VH upon discharge. Patient verbalized understanding of all discharge instructions explained by staff, to include follow up appointments, RX's and safety plan. Pt belongings returned to patient from locker # 29 intact. Patient escorted to lobby via staff for transport via to bus station  Safety maintained.
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
## 2023-05-12 NOTE — ED Notes (Signed)
Patient alert and oriented x 3. Denies SI/HI/AVH. Denies intent or plan to harm self or others. Routine conducted according to faculty protocol. Encourage patient to notify staff with any needs or concerns. Patient verbalized agreement and understanding. Will continue to monitor for safety. 

## 2023-05-12 NOTE — ED Notes (Signed)
Patient in milieu. Environment is secured. Will continue to monitor for safety. 

## 2023-05-12 NOTE — ED Notes (Addendum)
Patient is sleeping. Respirations equal and unlabored, skin warm and dry. No change in assessment or acuity. Routine safety checks conducted according to facility protocol. Will continue to monitor for safety.   

## 2023-05-12 NOTE — Care Management (Addendum)
Care Management   Writer spoke to the clinician at [facility] [place] and she informed me that they in the process of securing his [employer] ticket and they do not have the times as of yet.    The facility has requested 3-5 days of medication.  However, if you are not able to then paper scripts are fine because they have an on-site pharmacy  Writer informed the MD working with the patient.   12:00PM  Writer received patient's [employer] through [employer] provided the hard copy of the ticket to the nursing staff.   The patient [employer] Bus will leave today at 6pm and he will need to be at the [employer] an hour before the bus gets there to check in.   Clinical research associate provided a taxi voucher to the nursing staff.

## 2023-05-12 NOTE — ED Notes (Signed)
Request Tylenol for back pain
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
## 2023-05-12 NOTE — ED Notes (Signed)
Patient  sleeping in no acute stress. RR even and unlabored .Environment secured .Will continue to monitor for safely. 
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
## 2023-06-30 ENCOUNTER — Ambulatory Visit (INDEPENDENT_AMBULATORY_CARE_PROVIDER_SITE_OTHER)
Admission: EM | Admit: 2023-06-30 | Discharge: 2023-07-01 | Disposition: A | Discharge status: Other Acute Inpatient Hospital | Payer: Medicare HMO | Source: Home / Self Care

## 2023-06-30 DIAGNOSIS — Z0283 Encounter for blood-alcohol and blood-drug test: Secondary | ICD-10-CM | POA: Diagnosis not present

## 2023-06-30 DIAGNOSIS — F1020 Alcohol dependence, uncomplicated: Secondary | ICD-10-CM | POA: Insufficient documentation

## 2023-06-30 DIAGNOSIS — K7030 Alcoholic cirrhosis of liver without ascites: Secondary | ICD-10-CM | POA: Insufficient documentation

## 2023-06-30 DIAGNOSIS — F419 Anxiety disorder, unspecified: Secondary | ICD-10-CM | POA: Insufficient documentation

## 2023-06-30 DIAGNOSIS — A419 Sepsis, unspecified organism: Secondary | ICD-10-CM | POA: Diagnosis not present

## 2023-06-30 DIAGNOSIS — Z79899 Other long term (current) drug therapy: Secondary | ICD-10-CM | POA: Insufficient documentation

## 2023-06-30 DIAGNOSIS — Z5900 Homelessness unspecified: Secondary | ICD-10-CM | POA: Insufficient documentation

## 2023-06-30 DIAGNOSIS — F259 Schizoaffective disorder, unspecified: Secondary | ICD-10-CM | POA: Insufficient documentation

## 2023-06-30 DIAGNOSIS — F601 Schizoid personality disorder: Secondary | ICD-10-CM | POA: Insufficient documentation

## 2023-06-30 DIAGNOSIS — F319 Bipolar disorder, unspecified: Secondary | ICD-10-CM | POA: Insufficient documentation

## 2023-06-30 DIAGNOSIS — J189 Pneumonia, unspecified organism: Secondary | ICD-10-CM | POA: Diagnosis not present

## 2023-06-30 DIAGNOSIS — R45850 Homicidal ideations: Secondary | ICD-10-CM | POA: Insufficient documentation

## 2023-06-30 DIAGNOSIS — F1010 Alcohol abuse, uncomplicated: Secondary | ICD-10-CM | POA: Diagnosis present

## 2023-06-30 DIAGNOSIS — T40412A Poisoning by fentanyl or fentanyl analogs, intentional self-harm, initial encounter: Secondary | ICD-10-CM | POA: Insufficient documentation

## 2023-06-30 DIAGNOSIS — F1094 Alcohol use, unspecified with alcohol-induced mood disorder: Secondary | ICD-10-CM

## 2023-06-30 LAB — CBC WITH DIFFERENTIAL/PLATELET
Abs Immature Granulocytes: 0.03 K/uL (ref 0.00–0.07)
Basophils Absolute: 0.1 K/uL (ref 0.0–0.1)
Basophils Relative: 1 %
Eosinophils Absolute: 0.3 K/uL (ref 0.0–0.5)
Eosinophils Relative: 3 %
HCT: 40 % (ref 39.0–52.0)
Hemoglobin: 12.8 g/dL — ABNORMAL LOW (ref 13.0–17.0)
Immature Granulocytes: 0 %
Lymphocytes Relative: 16 %
Lymphs Abs: 1.6 K/uL (ref 0.7–4.0)
MCH: 24.3 pg — ABNORMAL LOW (ref 26.0–34.0)
MCHC: 32 g/dL (ref 30.0–36.0)
MCV: 75.9 fL — ABNORMAL LOW (ref 80.0–100.0)
Monocytes Absolute: 1.1 K/uL — ABNORMAL HIGH (ref 0.1–1.0)
Monocytes Relative: 11 %
Neutro Abs: 7.1 K/uL (ref 1.7–7.7)
Neutrophils Relative %: 69 %
Platelets: 204 K/uL (ref 150–400)
RBC: 5.27 MIL/uL (ref 4.22–5.81)
RDW: 19.9 % — ABNORMAL HIGH (ref 11.5–15.5)
WBC: 10.2 K/uL (ref 4.0–10.5)
nRBC: 0 % (ref 0.0–0.2)

## 2023-06-30 LAB — ETHANOL: Alcohol, Ethyl (B): 16 mg/dL — ABNORMAL HIGH

## 2023-06-30 LAB — COMPREHENSIVE METABOLIC PANEL
ALT: 26 U/L (ref 0–44)
AST: 37 U/L (ref 15–41)
Albumin: 3.7 g/dL (ref 3.5–5.0)
Alkaline Phosphatase: 94 U/L (ref 38–126)
Anion gap: 16 — ABNORMAL HIGH (ref 5–15)
BUN: 12 mg/dL (ref 8–23)
CO2: 22 mmol/L (ref 22–32)
Calcium: 9.3 mg/dL (ref 8.9–10.3)
Chloride: 96 mmol/L — ABNORMAL LOW (ref 98–111)
Creatinine, Ser: 0.7 mg/dL (ref 0.61–1.24)
GFR, Estimated: >60 mL/min (ref >60)
Glucose, Bld: 84 mg/dL (ref 70–99)
Potassium: 3.9 mmol/L (ref 3.5–5.1)
Sodium: 134 mmol/L — ABNORMAL LOW (ref 135–145)
Total Bilirubin: 1.4 mg/dL — ABNORMAL HIGH (ref 0.3–1.2)
Total Protein: 9.1 g/dL — ABNORMAL HIGH (ref 6.5–8.1)

## 2023-06-30 LAB — POCT URINE DRUG SCREEN - MANUAL ENTRY (I-SCREEN)
POC Amphetamine UR: NOT DETECTED
POC Buprenorphine (BUP): NOT DETECTED
POC Cocaine UR: NOT DETECTED
POC Marijuana UR: NOT DETECTED
POC Methadone UR: NOT DETECTED
POC Methamphetamine UR: NOT DETECTED
POC Morphine: NOT DETECTED
POC Oxazepam (BZO): POSITIVE — AB
POC Oxycodone UR: NOT DETECTED
POC Secobarbital (BAR): NOT DETECTED

## 2023-06-30 MED ORDER — LORAZEPAM 1 MG PO TABS: 1.0000 mg | ORAL_TABLET | Freq: Two times a day (BID) | ORAL | Status: DC

## 2023-06-30 MED ORDER — ACETAMINOPHEN 325 MG PO TABS
650.0000 mg | ORAL_TABLET | Freq: Four times a day (QID) | ORAL | Status: DC | PRN
  Administered 2023-07-01: 650 mg via ORAL
  Filled 2023-06-30: qty 2

## 2023-06-30 MED ORDER — THIAMINE MONONITRATE 100 MG PO TABS
100.0000 mg | ORAL_TABLET | Freq: Every day | ORAL | Status: DC
  Administered 2023-07-01: 100 mg via ORAL
  Filled 2023-06-30: qty 1

## 2023-06-30 MED ORDER — LORAZEPAM 1 MG PO TABS: 1.0000 mg | ORAL_TABLET | Freq: Every day | ORAL | Status: DC

## 2023-06-30 MED ORDER — THIAMINE HCL 100 MG/ML IJ SOLN
100.0000 mg | Freq: Once | INTRAMUSCULAR | Status: AC
  Administered 2023-06-30: 100 mg via INTRAMUSCULAR
  Filled 2023-06-30: qty 2

## 2023-06-30 MED ORDER — ADULT MULTIVITAMIN W/MINERALS CH
1.0000 | ORAL_TABLET | Freq: Every day | ORAL | Status: DC
  Administered 2023-07-01: 1 via ORAL
  Filled 2023-06-30: qty 1

## 2023-06-30 MED ORDER — MAGNESIUM HYDROXIDE 400 MG/5ML PO SUSP: 30.0000 mL | Freq: Every day | ORAL | Status: DC | PRN

## 2023-06-30 MED ORDER — LORAZEPAM 1 MG PO TABS
1.0000 mg | ORAL_TABLET | Freq: Four times a day (QID) | ORAL | Status: DC
  Administered 2023-06-30 – 2023-07-01 (×2): 1 mg via ORAL
  Filled 2023-06-30 (×2): qty 1

## 2023-06-30 MED ORDER — LORAZEPAM 1 MG PO TABS: 1.0000 mg | ORAL_TABLET | Freq: Three times a day (TID) | ORAL | Status: DC

## 2023-06-30 MED ORDER — LORAZEPAM 1 MG PO TABS
1.0000 mg | ORAL_TABLET | Freq: Four times a day (QID) | ORAL | Status: DC | PRN
  Administered 2023-07-01: 1 mg via ORAL
  Filled 2023-06-30: qty 1

## 2023-06-30 MED ORDER — TRAZODONE HCL 50 MG PO TABS: 50.0000 mg | ORAL_TABLET | Freq: Every evening | ORAL | Status: DC | PRN

## 2023-06-30 MED ORDER — ALUM & MAG HYDROXIDE-SIMETH 200-200-20 MG/5ML PO SUSP: 30.0000 mL | ORAL | Status: DC | PRN

## 2023-06-30 MED ORDER — HYDROXYZINE HCL 25 MG PO TABS: 25.0000 mg | ORAL_TABLET | Freq: Four times a day (QID) | ORAL | Status: DC | PRN

## 2023-06-30 MED ORDER — ONDANSETRON 4 MG PO TBDP: 4.0000 mg | ORAL_TABLET | Freq: Four times a day (QID) | ORAL | Status: DC | PRN

## 2023-06-30 MED ORDER — LOPERAMIDE HCL 2 MG PO CAPS: 2.0000 mg | ORAL_CAPSULE | ORAL | Status: DC | PRN

## 2023-06-30 NOTE — ED Notes (Signed)
Pt is A & O x 4. Pt is oriented to the unit and provided with meal. Medication has been administered to him and pt is lying on his bed. Pt endorses SI with of overdosing on his medication but can contract for safety here. Pt denies HI/AVH. Will continue to monitor for safety and provide support.

## 2023-06-30 NOTE — Progress Notes (Signed)
   06/30/23 2023  [facility] Triage Screening (Walk-ins at [facility] only)  How Did You Hear About [place]? Self  What Is the Reason for Your Visit/Call Today? Patient presents to [facility] voluntarily due to recent argument with partner. Patient reports having suicidal and homicadal thoughts during the argument and left before anything got worse. Patient reports struggling with alcohol addiction. Patient does endorse SI/HI. Patient reports plan to overdose on pills. Patient does not endorse AVH at this time. Patient reports wanting to stay to sober up then go to rehab. Patient is routine.  How Long Has This Been Causing You Problems? 1 wk - 1 month  Have You Recently Had Any Thoughts About Hurting Yourself? Yes  How long ago did you have thoughts about hurting yourself? today  Are You Planning to Commit Suicide/Harm Yourself At This time? Yes  Have you Recently Had Thoughts About Hurting Someone [name]? Yes  How long ago did you have thoughts of harming others? today  Are You Planning To Harm Someone At This Time? No  Are you currently experiencing any auditory, visual or other hallucinations? No  Have You Used Any Alcohol or Drugs in the Past 24 Hours? Yes  How long ago did you use Drugs or Alcohol? 30 minutes ago  What Did You Use and How Much? unknown amount  Do you have any current medical co-morbidities that require immediate attention? No  Please describe current medical co-morbidities that require immediate attention: high blood pressure  Clinician description of patient physical appearance/behavior: Fairly groomed calm and cooperative.  What Do You Feel Would Help You the Most Today? Treatment for Depression or other mood problem;Alcohol or Drug Use Treatment  If access to [facility] was not available, would you have sought care in the Emergency Department? Yes  Determination of Need Routine (7 days)  Options For Referral Mobile Crisis;Chemical Dependency Intensive Outpatient Therapy (CDIOP)

## 2023-06-30 NOTE — ED Triage Notes (Signed)
Patient presents to [facility] voluntarily due to recent argument with partner. Patient reports having suicidal and homicadal thoughts during the argument and left before anything got worse. Patient reports struggling with alcohol addiction. Patient does endorse SI/HI. Patient reports plan to overdose on pills. Patient does not endorse AVH at this time. Patient reports wanting to stay to sober up then go to rehab. Patient is routine.

## 2023-07-01 ENCOUNTER — Emergency Department (HOSPITAL_COMMUNITY): Payer: Medicare HMO

## 2023-07-01 ENCOUNTER — Encounter (HOSPITAL_COMMUNITY): Payer: Self-pay | Admitting: Emergency Medicine

## 2023-07-01 ENCOUNTER — Inpatient Hospital Stay (HOSPITAL_COMMUNITY)
Admission: EM | Admit: 2023-07-01 | Discharge: 2023-07-05 | DRG: 871 | Disposition: A | Discharge status: Home / Self Care | Payer: Medicare HMO | Attending: Family Medicine | Admitting: Family Medicine

## 2023-07-01 ENCOUNTER — Other Ambulatory Visit (HOSPITAL_COMMUNITY)
Admission: EM | Admit: 2023-07-01 | Discharge: 2023-07-01 | Disposition: A | Discharge status: Other Acute Inpatient Hospital | Payer: Medicare HMO | Attending: Psychiatry | Admitting: Psychiatry

## 2023-07-01 DIAGNOSIS — G4700 Insomnia, unspecified: Secondary | ICD-10-CM | POA: Insufficient documentation

## 2023-07-01 DIAGNOSIS — E871 Hypo-osmolality and hyponatremia: Secondary | ICD-10-CM | POA: Insufficient documentation

## 2023-07-01 DIAGNOSIS — N401 Enlarged prostate with lower urinary tract symptoms: Secondary | ICD-10-CM

## 2023-07-01 DIAGNOSIS — F10930 Alcohol use, unspecified with withdrawal, uncomplicated: Secondary | ICD-10-CM

## 2023-07-01 DIAGNOSIS — Z9151 Personal history of suicidal behavior: Secondary | ICD-10-CM | POA: Insufficient documentation

## 2023-07-01 DIAGNOSIS — Z5900 Homelessness unspecified: Secondary | ICD-10-CM | POA: Insufficient documentation

## 2023-07-01 DIAGNOSIS — Z1152 Encounter for screening for COVID-19: Secondary | ICD-10-CM

## 2023-07-01 DIAGNOSIS — Y900 Blood alcohol level of less than 20 mg/100 ml: Secondary | ICD-10-CM | POA: Diagnosis present

## 2023-07-01 DIAGNOSIS — F10939 Alcohol use, unspecified with withdrawal, unspecified: Secondary | ICD-10-CM | POA: Diagnosis present

## 2023-07-01 DIAGNOSIS — J189 Pneumonia, unspecified organism: Principal | ICD-10-CM

## 2023-07-01 DIAGNOSIS — F419 Anxiety disorder, unspecified: Secondary | ICD-10-CM | POA: Insufficient documentation

## 2023-07-01 DIAGNOSIS — F1010 Alcohol abuse, uncomplicated: Secondary | ICD-10-CM | POA: Insufficient documentation

## 2023-07-01 DIAGNOSIS — I10 Essential (primary) hypertension: Secondary | ICD-10-CM | POA: Diagnosis present

## 2023-07-01 DIAGNOSIS — F1994 Other psychoactive substance use, unspecified with psychoactive substance-induced mood disorder: Secondary | ICD-10-CM | POA: Diagnosis present

## 2023-07-01 DIAGNOSIS — F601 Schizoid personality disorder: Secondary | ICD-10-CM | POA: Diagnosis present

## 2023-07-01 DIAGNOSIS — F1024 Alcohol dependence with alcohol-induced mood disorder: Secondary | ICD-10-CM

## 2023-07-01 DIAGNOSIS — Z765 Malingerer [conscious simulation]: Secondary | ICD-10-CM

## 2023-07-01 DIAGNOSIS — F17210 Nicotine dependence, cigarettes, uncomplicated: Secondary | ICD-10-CM | POA: Diagnosis present

## 2023-07-01 DIAGNOSIS — K7030 Alcoholic cirrhosis of liver without ascites: Secondary | ICD-10-CM

## 2023-07-01 DIAGNOSIS — Z8619 Personal history of other infectious and parasitic diseases: Secondary | ICD-10-CM | POA: Insufficient documentation

## 2023-07-01 DIAGNOSIS — K7460 Unspecified cirrhosis of liver: Secondary | ICD-10-CM | POA: Diagnosis present

## 2023-07-01 DIAGNOSIS — F1094 Alcohol use, unspecified with alcohol-induced mood disorder: Secondary | ICD-10-CM

## 2023-07-01 DIAGNOSIS — N400 Enlarged prostate without lower urinary tract symptoms: Secondary | ICD-10-CM | POA: Insufficient documentation

## 2023-07-01 DIAGNOSIS — N3000 Acute cystitis without hematuria: Secondary | ICD-10-CM

## 2023-07-01 DIAGNOSIS — B182 Chronic viral hepatitis C: Secondary | ICD-10-CM | POA: Insufficient documentation

## 2023-07-01 DIAGNOSIS — A419 Sepsis, unspecified organism: Principal | ICD-10-CM | POA: Insufficient documentation

## 2023-07-01 DIAGNOSIS — Z560 Unemployment, unspecified: Secondary | ICD-10-CM

## 2023-07-01 DIAGNOSIS — R066 Hiccough: Secondary | ICD-10-CM | POA: Diagnosis present

## 2023-07-01 DIAGNOSIS — E876 Hypokalemia: Secondary | ICD-10-CM | POA: Diagnosis present

## 2023-07-01 DIAGNOSIS — G9341 Metabolic encephalopathy: Secondary | ICD-10-CM | POA: Diagnosis present

## 2023-07-01 DIAGNOSIS — F411 Generalized anxiety disorder: Secondary | ICD-10-CM | POA: Insufficient documentation

## 2023-07-01 DIAGNOSIS — D696 Thrombocytopenia, unspecified: Secondary | ICD-10-CM | POA: Diagnosis present

## 2023-07-01 DIAGNOSIS — F1090 Alcohol use, unspecified, uncomplicated: Secondary | ICD-10-CM

## 2023-07-01 DIAGNOSIS — R45850 Homicidal ideations: Secondary | ICD-10-CM | POA: Diagnosis present

## 2023-07-01 DIAGNOSIS — R6510 Systemic inflammatory response syndrome (SIRS) of non-infectious origin without acute organ dysfunction: Secondary | ICD-10-CM | POA: Diagnosis not present

## 2023-07-01 DIAGNOSIS — K439 Ventral hernia without obstruction or gangrene: Secondary | ICD-10-CM | POA: Diagnosis present

## 2023-07-01 DIAGNOSIS — Z8673 Personal history of transient ischemic attack (TIA), and cerebral infarction without residual deficits: Secondary | ICD-10-CM

## 2023-07-01 DIAGNOSIS — B9620 Unspecified Escherichia coli [E. coli] as the cause of diseases classified elsewhere: Secondary | ICD-10-CM | POA: Diagnosis present

## 2023-07-01 DIAGNOSIS — Z8659 Personal history of other mental and behavioral disorders: Secondary | ICD-10-CM

## 2023-07-01 DIAGNOSIS — Z23 Encounter for immunization: Secondary | ICD-10-CM

## 2023-07-01 DIAGNOSIS — F1190 Opioid use, unspecified, uncomplicated: Secondary | ICD-10-CM | POA: Insufficient documentation

## 2023-07-01 DIAGNOSIS — Z1611 Resistance to penicillins: Secondary | ICD-10-CM | POA: Diagnosis present

## 2023-07-01 DIAGNOSIS — F10239 Alcohol dependence with withdrawal, unspecified: Secondary | ICD-10-CM | POA: Diagnosis present

## 2023-07-01 DIAGNOSIS — I700 Atherosclerosis of aorta: Secondary | ICD-10-CM | POA: Diagnosis present

## 2023-07-01 DIAGNOSIS — E878 Other disorders of electrolyte and fluid balance, not elsewhere classified: Secondary | ICD-10-CM | POA: Diagnosis present

## 2023-07-01 DIAGNOSIS — D509 Iron deficiency anemia, unspecified: Secondary | ICD-10-CM | POA: Insufficient documentation

## 2023-07-01 DIAGNOSIS — Z9152 Personal history of nonsuicidal self-harm: Secondary | ICD-10-CM

## 2023-07-01 DIAGNOSIS — F1020 Alcohol dependence, uncomplicated: Secondary | ICD-10-CM | POA: Diagnosis not present

## 2023-07-01 DIAGNOSIS — Z5982 Transportation insecurity: Secondary | ICD-10-CM

## 2023-07-01 DIAGNOSIS — Z79899 Other long term (current) drug therapy: Secondary | ICD-10-CM

## 2023-07-01 DIAGNOSIS — Z5902 Unsheltered homelessness: Secondary | ICD-10-CM

## 2023-07-01 DIAGNOSIS — G894 Chronic pain syndrome: Secondary | ICD-10-CM | POA: Diagnosis present

## 2023-07-01 DIAGNOSIS — R3914 Feeling of incomplete bladder emptying: Secondary | ICD-10-CM

## 2023-07-01 DIAGNOSIS — R45851 Suicidal ideations: Secondary | ICD-10-CM | POA: Diagnosis present

## 2023-07-01 LAB — URINALYSIS, ROUTINE W REFLEX MICROSCOPIC
Bilirubin Urine: NEGATIVE
Glucose, UA: NEGATIVE mg/dL
Ketones, ur: NEGATIVE mg/dL
Nitrite: NEGATIVE
Protein, ur: 30 mg/dL — AB
Specific Gravity, Urine: 1.01 (ref 1.005–1.030)
pH: 6 (ref 5.0–8.0)

## 2023-07-01 LAB — COMPREHENSIVE METABOLIC PANEL
ALT: 24 U/L (ref 0–44)
AST: 39 U/L (ref 15–41)
Albumin: 3.3 g/dL — ABNORMAL LOW (ref 3.5–5.0)
Alkaline Phosphatase: 85 U/L (ref 38–126)
Anion gap: 14 (ref 5–15)
BUN: 15 mg/dL (ref 8–23)
CO2: 24 mmol/L (ref 22–32)
Calcium: 9 mg/dL (ref 8.9–10.3)
Chloride: 95 mmol/L — ABNORMAL LOW (ref 98–111)
Creatinine, Ser: 0.92 mg/dL (ref 0.61–1.24)
GFR, Estimated: >60 mL/min (ref >60)
Glucose, Bld: 104 mg/dL — ABNORMAL HIGH (ref 70–99)
Potassium: 3.9 mmol/L (ref 3.5–5.1)
Sodium: 133 mmol/L — ABNORMAL LOW (ref 135–145)
Total Bilirubin: 1.8 mg/dL — ABNORMAL HIGH (ref 0.3–1.2)
Total Protein: 8.4 g/dL — ABNORMAL HIGH (ref 6.5–8.1)

## 2023-07-01 LAB — URINALYSIS, W/ REFLEX TO CULTURE (INFECTION SUSPECTED)
Bilirubin Urine: NEGATIVE
Glucose, UA: NEGATIVE mg/dL
Ketones, ur: NEGATIVE mg/dL
Nitrite: POSITIVE — AB
Protein, ur: 30 mg/dL — AB
Specific Gravity, Urine: 1.01 (ref 1.005–1.030)
WBC, UA: >50 WBC/hpf (ref 0–5)
pH: 6 (ref 5.0–8.0)

## 2023-07-01 LAB — CBC WITH DIFFERENTIAL/PLATELET
Abs Immature Granulocytes: 0.08 10*3/uL — ABNORMAL HIGH (ref 0.00–0.07)
Basophils Absolute: 0 10*3/uL (ref 0.0–0.1)
Basophils Relative: 0 %
Eosinophils Absolute: 0 10*3/uL (ref 0.0–0.5)
Eosinophils Relative: 0 %
HCT: 39.6 % (ref 39.0–52.0)
Hemoglobin: 13 g/dL (ref 13.0–17.0)
Immature Granulocytes: 1 %
Lymphocytes Relative: 13 %
Lymphs Abs: 1.6 10*3/uL (ref 0.7–4.0)
MCH: 25.2 pg — ABNORMAL LOW (ref 26.0–34.0)
MCHC: 32.8 g/dL (ref 30.0–36.0)
MCV: 76.9 fL — ABNORMAL LOW (ref 80.0–100.0)
Monocytes Absolute: 2.1 10*3/uL — ABNORMAL HIGH (ref 0.1–1.0)
Monocytes Relative: 17 %
Neutro Abs: 9 10*3/uL — ABNORMAL HIGH (ref 1.7–7.7)
Neutrophils Relative %: 69 %
Platelets: 139 10*3/uL — ABNORMAL LOW (ref 150–400)
RBC: 5.15 MIL/uL (ref 4.22–5.81)
RDW: 19.9 % — ABNORMAL HIGH (ref 11.5–15.5)
WBC: 12.9 10*3/uL — ABNORMAL HIGH (ref 4.0–10.5)
nRBC: 0 % (ref 0.0–0.2)

## 2023-07-01 LAB — SARS CORONAVIRUS 2 BY RT PCR: SARS Coronavirus 2 by RT PCR: NEGATIVE

## 2023-07-01 LAB — URINALYSIS, MICROSCOPIC (REFLEX): WBC, UA: >50 WBC/hpf (ref 0–5)

## 2023-07-01 LAB — LIPASE, BLOOD: Lipase: 45 U/L (ref 11–51)

## 2023-07-01 MED ORDER — ALUM & MAG HYDROXIDE-SIMETH 200-200-20 MG/5ML PO SUSP: 30.0000 mL | ORAL | Status: DC | PRN

## 2023-07-01 MED ORDER — IOHEXOL 350 MG/ML SOLN
75.0000 mL | Freq: Once | INTRAVENOUS | Status: AC | PRN
  Administered 2023-07-01: 75 mL via INTRAVENOUS

## 2023-07-01 MED ORDER — LACTATED RINGERS IV BOLUS (SEPSIS): 1000.0000 mL | Freq: Once | INTRAVENOUS | Status: DC

## 2023-07-01 MED ORDER — SODIUM CHLORIDE 0.9 % IV SOLN
1.0000 g | Freq: Once | INTRAVENOUS | Status: AC
  Administered 2023-07-02: 1 g via INTRAVENOUS
  Filled 2023-07-01: qty 10

## 2023-07-01 MED ORDER — AMLODIPINE BESYLATE 5 MG PO TABS
5.0000 mg | ORAL_TABLET | Freq: Once | ORAL | Status: AC
  Administered 2023-07-01: 5 mg via ORAL
  Filled 2023-07-01: qty 1

## 2023-07-01 MED ORDER — ACETAMINOPHEN 325 MG PO TABS
325.0000 mg | ORAL_TABLET | Freq: Four times a day (QID) | ORAL | Status: DC | PRN
  Administered 2023-07-01: 325 mg via ORAL
  Filled 2023-07-01: qty 1

## 2023-07-01 MED ORDER — TRAZODONE HCL 50 MG PO TABS: 50.0000 mg | ORAL_TABLET | Freq: Every evening | ORAL | Status: DC | PRN

## 2023-07-01 MED ORDER — GABAPENTIN 300 MG PO CAPS
300.0000 mg | ORAL_CAPSULE | Freq: Three times a day (TID) | ORAL | Status: DC
  Administered 2023-07-01: 300 mg via ORAL
  Filled 2023-07-01: qty 1

## 2023-07-01 MED ORDER — THIAMINE MONONITRATE 100 MG PO TABS: 100.0000 mg | ORAL_TABLET | Freq: Every day | ORAL | Status: DC

## 2023-07-01 MED ORDER — LACTATED RINGERS IV BOLUS (SEPSIS): 500.0000 mL | Freq: Once | INTRAVENOUS | Status: DC

## 2023-07-01 MED ORDER — ONDANSETRON 4 MG PO TBDP: 4.0000 mg | ORAL_TABLET | Freq: Four times a day (QID) | ORAL | Status: DC | PRN

## 2023-07-01 MED ORDER — LACTATED RINGERS IV BOLUS (SEPSIS)
1000.0000 mL | Freq: Once | INTRAVENOUS | Status: AC
  Administered 2023-07-02: 1000 mL via INTRAVENOUS

## 2023-07-01 MED ORDER — AMLODIPINE BESYLATE 10 MG PO TABS: 10.0000 mg | ORAL_TABLET | Freq: Every day | ORAL | Status: DC

## 2023-07-01 MED ORDER — ADULT MULTIVITAMIN W/MINERALS CH: 1.0000 | ORAL_TABLET | Freq: Every day | ORAL | Status: DC

## 2023-07-01 MED ORDER — MAGNESIUM HYDROXIDE 400 MG/5ML PO SUSP: 30.0000 mL | Freq: Every day | ORAL | Status: DC | PRN

## 2023-07-01 MED ORDER — LORAZEPAM 1 MG PO TABS
1.0000 mg | ORAL_TABLET | Freq: Four times a day (QID) | ORAL | Status: DC | PRN
  Administered 2023-07-01: 1 mg via ORAL
  Filled 2023-07-01: qty 1

## 2023-07-01 MED ORDER — HYDROXYZINE HCL 25 MG PO TABS: 25.0000 mg | ORAL_TABLET | Freq: Four times a day (QID) | ORAL | Status: DC | PRN

## 2023-07-01 MED ORDER — SODIUM CHLORIDE 0.9 % IV SOLN
500.0000 mg | Freq: Once | INTRAVENOUS | Status: AC
  Administered 2023-07-02: 500 mg via INTRAVENOUS
  Filled 2023-07-01: qty 5

## 2023-07-01 MED ORDER — GABAPENTIN 300 MG PO CAPS: 300.0000 mg | ORAL_CAPSULE | Freq: Three times a day (TID) | ORAL | Status: DC

## 2023-07-01 MED ORDER — LOPERAMIDE HCL 2 MG PO CAPS: 2.0000 mg | ORAL_CAPSULE | ORAL | Status: DC | PRN

## 2023-07-01 MED ORDER — ONDANSETRON 4 MG PO TBDP
4.0000 mg | ORAL_TABLET | Freq: Once | ORAL | Status: AC
  Administered 2023-07-01: 4 mg via ORAL
  Filled 2023-07-01: qty 1

## 2023-07-01 MED ORDER — CHLORDIAZEPOXIDE HCL 25 MG PO CAPS: 25.0000 mg | ORAL_CAPSULE | Freq: Three times a day (TID) | ORAL | Status: DC | PRN

## 2023-07-01 MED ORDER — LACTATED RINGERS IV SOLN: INTRAVENOUS | Status: DC

## 2023-07-01 MED ORDER — LACTATED RINGERS IV BOLUS
1000.0000 mL | Freq: Once | INTRAVENOUS | Status: AC
  Administered 2023-07-01: 1000 mL via INTRAVENOUS

## 2023-07-01 NOTE — ED Notes (Addendum)
Patient transported to CT, patient verified name with transporter prior to moving.

## 2023-07-01 NOTE — ED Notes (Signed)
Patient discharged. Sent to [facility] ED for medical clearance via EMS

## 2023-07-01 NOTE — ED Provider Notes (Signed)
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 (NOTE) Calculated using  the CKD-EPI Creatinine Equation (2021)    Anion gap 02/10/2023 15  5 - 15 Final   Performed at [facility], [address] [place], [place] [address]   Lipase 02/10/2023 77 (H)  11 - 51 U/L Final   Performed at [facility], [address], [place], [place] [address]   Color, Urine 02/11/2023 YELLOW  YELLOW Final   APPearance 02/11/2023 CLEAR  CLEAR Final   Specific Gravity, Urine 02/11/2023 1.011  1.005 - 1.030 Final   pH  02/11/2023 5.0  5.0 - 8.0 Final   Glucose, UA 02/11/2023 NEGATIVE  NEGATIVE mg/dL Final   Hgb urine dipstick 02/11/2023 NEGATIVE  NEGATIVE Final   Bilirubin Urine 02/11/2023 NEGATIVE  NEGATIVE Final   Ketones, ur 02/11/2023 NEGATIVE  NEGATIVE mg/dL Final   Protein, ur 91/47/8295 30 (A)  NEGATIVE mg/dL Final   Nitrite 62/13/0865 NEGATIVE  NEGATIVE Final   Leukocytes,Ua 02/11/2023 NEGATIVE  NEGATIVE Final   RBC / HPF 02/11/2023 0-5  0 - 5 RBC/hpf Final   WBC, UA 02/11/2023 0-5  0 - 5 WBC/hpf Final   Bacteria, UA 02/11/2023 NONE SEEN  NONE SEEN Final   Squamous Epithelial / HPF 02/11/2023 0-5  0 - 5 /HPF Final   Mucus 02/11/2023 PRESENT   Final   Performed at [facility], [address] [place], [place] [address]   Alcohol, Ethyl (B) 02/10/2023 205 (H)  <10 mg/dL Final   Comment: (NOTE) Lowest detectable limit for serum alcohol is 10 mg/dL.  For medical purposes only. Performed at [facility], [address], [place], [place] [address]    Opiates 02/11/2023 NONE DETECTED  NONE DETECTED Final   Cocaine 02/11/2023 NONE DETECTED  NONE DETECTED Final   Benzodiazepines 02/11/2023 NONE DETECTED  NONE DETECTED Final   Amphetamines 02/11/2023 NONE DETECTED  NONE DETECTED Final   Tetrahydrocannabinol 02/11/2023 NONE DETECTED  NONE DETECTED Final   Barbiturates 02/11/2023 NONE DETECTED  NONE DETECTED Final   Comment: (NOTE) DRUG SCREEN FOR MEDICAL PURPOSES ONLY.  IF CONFIRMATION IS NEEDED FOR ANY PURPOSE, NOTIFY LAB WITHIN 5 DAYS.  LOWEST DETECTABLE LIMITS FOR URINE DRUG SCREEN Drug Class                     Cutoff (ng/mL) Amphetamine and metabolites    1000 Barbiturate and metabolites    200 Benzodiazepine                 200 Opiates and metabolites        300 Cocaine and metabolites        300 THC                            50 Performed at [facility], [address], [place], [place] [address]   Admission on 01/24/2023,  Discharged on 01/24/2023  Component Date Value Ref Range Status   Sodium 01/24/2023 139  135 - 145 mmol/L Final   Potassium 01/24/2023 3.9  3.5 - 5.1 mmol/L Final   Chloride 01/24/2023 107  98 - 111 mmol/L Final   CO2 01/24/2023 24  22 - 32 mmol/L Final   Glucose, Bld 01/24/2023 86  70 - 99 mg/dL Final   Glucose reference range applies only to samples taken after fasting for at least 8 hours.   BUN 01/24/2023 12  8 - 23 mg/dL Final   Creatinine, Ser 01/24/2023 0.85  0.61 - 1.24 mg/dL Final   Calcium 16/07/9603 8.4 (L)  8.9 - 10.3 mg/dL Final   Total Protein 54/06/8118 7.6  6.5 - 8.1 g/dL Final   Albumin 14/78/2956 3.4 (L)  3.5 - 5.0 g/dL Final   AST 21/30/8657 59 (H)  15 - 41 U/L Final   ALT 01/24/2023 30  0 - 44 U/L Final   Alkaline Phosphatase 01/24/2023 64  38 - 126 U/L Final   Total Bilirubin 01/24/2023 1.3 (H)  0.3 - 1.2 mg/dL Final   GFR, Estimated 01/24/2023 >60  >60 mL/min Final   Comment: (NOTE) Calculated using the CKD-EPI Creatinine Equation (2021)    Anion gap 01/24/2023 8  5 - 15 Final   Performed at [facility], [address], [place], [place] [address]   Alcohol, Ethyl (B) 01/24/2023 249 (H)  <10 mg/dL Final   Comment: (NOTE) Lowest detectable limit for serum alcohol is 10 mg/dL.  For medical purposes only. Performed at [facility], [address] [place], [place] [address]    Troponin I (High Sensitivity) 01/24/2023 5  <18 ng/L Final   Comment: (NOTE) Elevated high sensitivity troponin I (hsTnI) values and significant  changes across serial measurements may suggest ACS but many other  chronic and acute conditions are known to elevate hsTnI results.  Refer to the "Links" section for chest pain algorithms and additional  guidance. Performed at [facility], [address], [place], [place] [address]    WBC 01/24/2023 4.5  4.0 - 10.5 K/uL Final   RBC 01/24/2023 5.20  4.22 - 5.81 MIL/uL Final    Hemoglobin 01/24/2023 12.2 (L)  13.0 - 17.0 g/dL Final   HCT 40/07/2724 40.4  39.0 - 52.0 % Final   MCV 01/24/2023 77.7 (L)  80.0 - 100.0 fL Final   MCH 01/24/2023 23.5 (L)  26.0 - 34.0 pg Final   MCHC 01/24/2023 30.2  30.0 - 36.0 g/dL Final   RDW 36/64/4034 19.4 (H)  11.5 - 15.5 % Final   Platelets 01/24/2023 133 (L)  150 - 400 K/uL Final   nRBC 01/24/2023 0.0  0.0 - 0.2 % Final   Neutrophils Relative % 01/24/2023 48  % Final   Neutro Abs 01/24/2023 2.2  1.7 - 7.7 K/uL Final   Lymphocytes Relative 01/24/2023 39  % Final   Lymphs Abs 01/24/2023 1.8  0.7 - 4.0 K/uL Final   Monocytes Relative 01/24/2023 9  % Final   Monocytes Absolute 01/24/2023 0.4  0.1 - 1.0 K/uL Final   Eosinophils Relative 01/24/2023 3  % Final   Eosinophils Absolute 01/24/2023 0.1  0.0 - 0.5 K/uL Final   Basophils Relative 01/24/2023 1  % Final   Basophils Absolute 01/24/2023 0.1  0.0 - 0.1 K/uL Final   Immature Granulocytes 01/24/2023 0  % Final   Abs Immature Granulocytes 01/24/2023 0.00  0.00 - 0.07 K/uL Final   Performed at [facility], [address] [place], [place] [address]   Prothrombin Time 01/24/2023 13.8  11.4 - 15.2 seconds Final   INR 01/24/2023 1.1  0.8 - 1.2 Final   Comment: (NOTE) INR goal varies based on device and disease states. Performed at [facility], [address] [place], [place] [address]    ABO/RH(D) 01/24/2023 O POS   Final   Antibody Screen 01/24/2023 NEG   Final   Sample Expiration 01/24/2023    Final  Value:01/27/2023,2359 Performed at [facility], [address], [place], [place] [address]    Opiates 01/24/2023 NONE DETECTED  NONE DETECTED Final   Cocaine 01/24/2023 NONE DETECTED  NONE DETECTED Final   Benzodiazepines 01/24/2023 NONE DETECTED  NONE DETECTED Final   Amphetamines 01/24/2023 NONE DETECTED  NONE DETECTED Final   Tetrahydrocannabinol 01/24/2023 NONE DETECTED  NONE DETECTED Final    Barbiturates 01/24/2023 NONE DETECTED  NONE DETECTED Final   Comment: (NOTE) DRUG SCREEN FOR MEDICAL PURPOSES ONLY.  IF CONFIRMATION IS NEEDED FOR ANY PURPOSE, NOTIFY LAB WITHIN 5 DAYS.  LOWEST DETECTABLE LIMITS FOR URINE DRUG SCREEN Drug Class                     Cutoff (ng/mL) Amphetamine and metabolites    1000 Barbiturate and metabolites    200 Benzodiazepine                 200 Opiates and metabolites        300 Cocaine and metabolites        300 THC                            50 Performed at [facility], [address] [place], [place] [address]   Admission on 01/09/2023, Discharged on 01/09/2023  Component Date Value Ref Range Status   Sodium 01/09/2023 135  135 - 145 mmol/L Final   Potassium 01/09/2023 3.8  3.5 - 5.1 mmol/L Final   Chloride 01/09/2023 97 (L)  98 - 111 mmol/L Final   CO2 01/09/2023 27  22 - 32 mmol/L Final   Glucose, Bld 01/09/2023 101 (H)  70 - 99 mg/dL Final   Glucose reference range applies only to samples taken after fasting for at least 8 hours.   BUN 01/09/2023 7 (L)  8 - 23 mg/dL Final   Creatinine, Ser 01/09/2023 0.77  0.61 - 1.24 mg/dL Final   Calcium 09/81/1914 8.9  8.9 - 10.3 mg/dL Final   Total Protein 78/29/5621 8.5 (H)  6.5 - 8.1 g/dL Final   Albumin 30/86/5784 3.9  3.5 - 5.0 g/dL Final   AST 69/62/9528 91 (H)  15 - 41 U/L Final   ALT 01/09/2023 69 (H)  0 - 44 U/L Final   Alkaline Phosphatase 01/09/2023 79  38 - 126 U/L Final   Total Bilirubin 01/09/2023 2.1 (H)  0.3 - 1.2 mg/dL Final   GFR, Estimated 01/09/2023 >60  >60 mL/min Final   Comment: (NOTE) Calculated using the CKD-EPI Creatinine Equation (2021)    Anion gap 01/09/2023 11  5 - 15 Final   Performed at [facility], [address] [place], [place] [address]   Alcohol, Ethyl (B) 01/09/2023 258 (H)  <10 mg/dL Final   Comment: (NOTE) Lowest detectable limit for serum alcohol is 10 mg/dL.  For medical purposes only. Performed at [facility], [address], [place], [place] [address]    Salicylate Lvl 01/09/2023 <7.0 (L)  7.0 - 30.0 mg/dL Final   Performed at [facility], [address], [place], [place] [address]   Acetaminophen (Tylenol), Serum 01/09/2023 <10 (L)  10 - 30 ug/mL Final   Comment: (NOTE) Therapeutic concentrations vary significantly. A range of 10-30 ug/mL  may be an effective concentration for many patients. However, some  are best treated at concentrations outside of this range. Acetaminophen concentrations >150 ug/mL at 4  hours after ingestion  and >50 ug/mL at 12 hours after ingestion are often associated with  toxic reactions.  Performed at [facility], [address], [place], [place] [address]    WBC 01/09/2023 4.3  4.0 - 10.5 K/uL Final   RBC 01/09/2023 5.64  4.22 - 5.81 MIL/uL Final   Hemoglobin 01/09/2023 13.2  13.0 - 17.0 g/dL Final   HCT 60/45/4098 43.0  39.0 - 52.0 % Final   MCV 01/09/2023 76.2 (L)  80.0 - 100.0 fL Final   MCH 01/09/2023 23.4 (L)  26.0 - 34.0 pg Final   MCHC 01/09/2023 30.7  30.0 - 36.0 g/dL Final   RDW 11/91/4782 18.7 (H)  11.5 - 15.5 % Final   Platelets 01/09/2023 96 (L)  150 - 400 K/uL Final   Comment: SPECIMEN CHECKED FOR CLOTS REPEATED TO VERIFY PLATELET COUNT CONFIRMED BY SMEAR    nRBC 01/09/2023 0.0  0.0 - 0.2 % Final   Performed at [facility], [address], [place], [place] [address]   Opiates 01/09/2023 NONE DETECTED  NONE DETECTED Final   Cocaine 01/09/2023 NONE DETECTED  NONE DETECTED Final   Benzodiazepines 01/09/2023 NONE DETECTED  NONE DETECTED Final   Amphetamines 01/09/2023 NONE DETECTED  NONE DETECTED Final   Tetrahydrocannabinol 01/09/2023 NONE DETECTED  NONE DETECTED Final   Barbiturates 01/09/2023 NONE DETECTED  NONE DETECTED Final   Comment: (NOTE) DRUG SCREEN FOR MEDICAL PURPOSES ONLY.  IF CONFIRMATION IS NEEDED FOR ANY PURPOSE, NOTIFY LAB WITHIN 5  DAYS.  LOWEST DETECTABLE LIMITS FOR URINE DRUG SCREEN Drug Class                     Cutoff (ng/mL) Amphetamine and metabolites    1000 Barbiturate and metabolites    200 Benzodiazepine                 200 Opiates and metabolites        300 Cocaine and metabolites        300 THC                            50 Performed at [facility], [address], [place], [place] [address]    SARS Coronavirus 2 by RT PCR 01/09/2023 NEGATIVE  NEGATIVE Final   Comment: (NOTE) SARS-CoV-2 target nucleic acids are NOT DETECTED.  The SARS-CoV-2 RNA is generally detectable in upper respiratory specimens during the acute phase of infection. The lowest concentration of SARS-CoV-2 viral copies this assay can detect is 138 copies/mL. A negative result does not preclude SARS-Cov-2 infection and should not be used as the sole basis for treatment or other patient management decisions. A negative result may occur with  improper specimen collection/handling, submission of specimen other than nasopharyngeal swab, presence of viral mutation(s) within the areas targeted by this assay, and inadequate number of viral copies(<138 copies/mL). A negative result must be combined with clinical observations, patient history, and epidemiological information. The expected result is Negative.  Fact Sheet for Patients:  BloggerCourse.com  Fact Sheet for Healthcare Providers:  SeriousBroker.it  This test is no                          t yet approved or cleared by the [place] FDA and  has been authorized for detection and/or diagnosis of SARS-CoV-2 by FDA under an Emergency Use Authorization (EUA). This  EUA will remain  in effect (meaning this test can be used) for the duration of the COVID-19 declaration under Section 564(b)(1) of the Act, 21 U.S.C.section 360bbb-3(b)(1), unless the authorization is terminated  or revoked sooner.        Influenza A by PCR 01/09/2023 NEGATIVE  NEGATIVE Final   Influenza B by PCR 01/09/2023 NEGATIVE  NEGATIVE Final   Comment: (NOTE) The Xpert Xpress SARS-CoV-2/FLU/RSV plus assay is intended as an aid in the diagnosis of influenza from Nasopharyngeal swab specimens and should not be used as a sole basis for treatment. Nasal washings and aspirates are unacceptable for Xpert Xpress SARS-CoV-2/FLU/RSV testing.  Fact Sheet for Patients: BloggerCourse.com  Fact Sheet for Healthcare Providers: SeriousBroker.it  This test is not yet approved or cleared by the [place] FDA and has been authorized for detection and/or diagnosis of SARS-CoV-2 by FDA under an Emergency Use Authorization (EUA). This EUA will remain in effect (meaning this test can be used) for the duration of the COVID-19 declaration under Section 564(b)(1) of the Act, 21 U.S.C. section 360bbb-3(b)(1), unless the authorization is terminated or revoked.     Resp Syncytial Virus by PCR 01/09/2023 NEGATIVE  NEGATIVE Final   Comment: (NOTE) Fact Sheet for Patients: BloggerCourse.com  Fact Sheet for Healthcare Providers: SeriousBroker.it  This test is not yet approved or cleared by the [place] FDA and has been authorized for detection and/or diagnosis of SARS-CoV-2 by FDA under an Emergency Use Authorization (EUA). This EUA will remain in effect (meaning this test can be used) for the duration of the COVID-19 declaration under Section 564(b)(1) of the Act, 21 U.S.C. section 360bbb-3(b)(1), unless the authorization is terminated or revoked.  Performed at [facility], [address], [place], [place] [address]    Cholesterol 01/09/2023 174  0 - 200 mg/dL Final   Triglycerides 60/45/4098 53  <150 mg/dL Final   HDL 11/91/4782 92  >40 mg/dL Final   Total CHOL/HDL Ratio 01/09/2023 1.9  RATIO Final    VLDL 01/09/2023 11  0 - 40 mg/dL Final   LDL Cholesterol 01/09/2023 71  0 - 99 mg/dL Final   Comment:        Total Cholesterol/HDL:CHD Risk Coronary Heart Disease Risk Table                     Men   Women  1/2 Average Risk   3.4   3.3  Average Risk       5.0   4.4  2 X Average Risk   9.6   7.1  3 X Average Risk  23.4   11.0        Use the calculated Patient Ratio above and the CHD Risk Table to determine the patient's CHD Risk.        ATP III CLASSIFICATION (LDL):  <100     mg/dL   Optimal  956-213  mg/dL   Near or Above                    Optimal  130-159  mg/dL   Borderline  086-578  mg/dL   High  >469     mg/dL   Very High Performed at [facility], [address], [place], [place] [address]    TSH 01/09/2023 2.744  0.350 - 4.500 uIU/mL Final   Comment: Performed by a 3rd Generation assay with a functional sensitivity of <=0.01 uIU/mL. Performed at [facility], [address] [place], [place] [address]    Magnesium 01/09/2023 2.1  1.7 - 2.4 mg/dL Final   Performed at [facility], [address] [place], [place] [address]   Color, Urine 01/09/2023 STRAW (A)  YELLOW Final   APPearance 01/09/2023 CLEAR  CLEAR Final   Specific Gravity, Urine 01/09/2023 1.002 (L)  1.005 - 1.030 Final   pH 01/09/2023 6.0  5.0 - 8.0 Final   Glucose, UA 01/09/2023 NEGATIVE  NEGATIVE mg/dL Final   Hgb urine dipstick 01/09/2023 NEGATIVE  NEGATIVE Final   Bilirubin Urine 01/09/2023 NEGATIVE  NEGATIVE Final   Ketones, ur 01/09/2023 NEGATIVE  NEGATIVE mg/dL Final   Protein, ur 09/81/1914 NEGATIVE  NEGATIVE mg/dL Final   Nitrite 78/29/5621 NEGATIVE  NEGATIVE Final   Leukocytes,Ua 01/09/2023 NEGATIVE  NEGATIVE Final   Performed at [facility], [address], [place], [place] [address]   Prolactin 01/09/2023 27.2 (H)  3.6 - 25.2 ng/mL Final   Comment: (NOTE) Performed At: [facility] [address]  [place], [place] [id number] Jolene Schimke MD MW:[id number]     Allergies: Carrot [daucus carota] and Carrot [daucus carota]  Medications:  Facility Ordered Medications  Medication   acetaminophen (TYLENOL) tablet 650 mg   alum & mag hydroxide-simeth (MAALOX/MYLANTA) 200-200-20 MG/5ML suspension 30 mL   magnesium hydroxide (MILK OF MAGNESIA) suspension 30 mL   traZODone (DESYREL) tablet 50 mg   [COMPLETED] thiamine (VITAMIN B1) injection 100 mg   thiamine (VITAMIN B1) tablet 100 mg   multivitamin with minerals tablet 1 tablet   LORazepam (ATIVAN) tablet 1 mg   hydrOXYzine (ATARAX) tablet 25 mg   loperamide (IMODIUM) capsule 2-4 mg   ondansetron (ZOFRAN-ODT) disintegrating tablet 4 mg   LORazepam (ATIVAN) tablet 1 mg   Followed by   Melene Muller ON 07/02/2023] LORazepam (ATIVAN) tablet 1 mg   Followed by   Melene Muller ON 07/03/2023] LORazepam (ATIVAN) tablet 1 mg   Followed by   Melene Muller ON 07/05/2023] LORazepam (ATIVAN) tablet 1 mg   PTA Medications  Medication Sig   gabapentin (NEURONTIN) 300 MG capsule Take 1 capsule (300 mg total) by mouth 3 (three) times daily.   sucralfate (CARAFATE) 1 g tablet Take 1 tablet (1 g total) by mouth 4 (four) times daily -  with meals and at bedtime.   Multiple Vitamin (MULTIVITAMIN WITH MINERALS) TABS tablet Take 1 tablet by mouth daily.      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    Recommendations  Based on my evaluation the patient does not appear to have an emergency medical condition. Patient will be admitted to [facility] for crisis management, stabilization, and safety.   Jasper Riling,  NP 07/01/23  1:58 AM

## 2023-07-01 NOTE — ED Notes (Signed)
Patient Alert and Oriented X 3. Denies SI/HI or AVH. Patient contracted for safety. Is currently calm and cooperative. Safety of environment ensured. Patient admitted for alcohol detox. Will continue to monitor for safety.

## 2023-07-01 NOTE — ED Provider Notes (Signed)
FBC/OBS ASAP Discharge Summary  Date and Time: 07/01/2023 9:11 AM  Name: [name]  MRN:  [id number]   Discharge Diagnoses:  Final diagnoses:  Alcohol use disorder, severe, dependence (HCC)  Schizoid personality disorder (HCC)  Alcohol-induced mood disorder (HCC)    Subjective: "I don't feel good right now"   Stay Summary:  [name] is a 67 male who was admitted to [facility] unit  last night. He presented here complaining of his alcohol use problem along with depression, and seeking treatment. He was endorsing suicidal ideations with a plan to overdose on Fentanyl. He was also endorsing homicidal thoughts toward his girlfriend.  He presented with a hx of Schizoaffective disorder, depression and substance abuse problem. He is known to the system and his last visit here was 05/07/2023. He was admitted to the unit and CIWA protocol initiated. Per nursing review, his most recent CIWAs 10 and 6. BP 150/66. Gabapentin 300mg  TID and Amlodipine 5 mg Daily prescribed as home medications.   Upon assessment this morning: Patient seen face-to-face by this NP and chart reviewed. 67 year-old male who is in bed awake. He appears anxious and disheveled and states "I don't feel good right now". He is alert and oriented x4. Cop[perative with assessment. His eye contact is fair and thought process clear, organized and goal-directed. He does not appear to be preoccupied. Does not appear to be responding to internal stimuli. Patient appears to be having moderate tremors. He denies SI/HI AVH but admits that he was feeling suicidal prior to admission. Reports that he recently completed treatment in a rehab services but relapsed right after discharge. He rates his depression at 10/10, anxiety 10/10. Reports that he slept about 3 hours last night. He reports that being homeless has a lot to do with his drinking.  Patient reports that he is willing to complete detox and be referred to another treatment  service for a long term stay. Patient reports aching allover. He mild nausea and decreased appetite. Reports not having any support system and  his relationship with his girlfriend is currently unhealthy.   Patient  denies suicidal thoughts and he is requesting treatment for his substance abuse problem as well as depression. We will transfer him to [facility] per facility protocol.   Total Time spent with patient: 30 minutes  Past Psychiatric History: MDD, Schizoaffective disorder, Substance induced mood disorder, homelessness Past Medical History: Hep C, Cirrhosis, CVA, HTN Family History: NA Family Psychiatric History: NA Social History: Homeless, unemployed, no support system.  Tobacco Cessation:  N/A, patient does not currently use tobacco products  Current Medications:  Current Facility-Administered Medications  Medication Dose Route Frequency Provider Last Rate Last Admin   acetaminophen (TYLENOL) tablet 650 mg  650 mg Oral Q6H PRN Bobbitt, Shalon E, NP   650 mg at 07/01/23 0815   alum & mag hydroxide-simeth (MAALOX/MYLANTA) 200-200-20 MG/5ML suspension 30 mL  30 mL Oral Q4H PRN Bobbitt, Shalon E, NP       hydrOXYzine (ATARAX) tablet 25 mg  25 mg Oral Q6H PRN Bobbitt, Shalon E, NP       loperamide (IMODIUM) capsule 2-4 mg  2-4 mg Oral PRN Bobbitt, Shalon E, NP       LORazepam (ATIVAN) tablet 1 mg  1 mg Oral Q6H PRN Bobbitt, Shalon E, NP   1 mg at 07/01/23 0633   LORazepam (ATIVAN) tablet 1 mg  1 mg Oral QID Bobbitt, Shalon E, NP   1 mg at 07/01/23 [phone]  Followed by   Melene Muller ON 07/02/2023] LORazepam (ATIVAN) tablet 1 mg  1 mg Oral TID Bobbitt, Shalon E, NP       Followed by   Melene Muller ON 07/03/2023] LORazepam (ATIVAN) tablet 1 mg  1 mg Oral BID Bobbitt, Shalon E, NP       Followed by   Melene Muller ON 07/05/2023] LORazepam (ATIVAN) tablet 1 mg  1 mg Oral Daily Bobbitt, Shalon E, NP       magnesium hydroxide (MILK OF MAGNESIA) suspension 30 mL  30 mL Oral Daily PRN Bobbitt, Shalon E, NP        multivitamin with minerals tablet 1 tablet  1 tablet Oral Daily Bobbitt, Shalon E, NP   1 tablet at 07/01/23 0906   ondansetron (ZOFRAN-ODT) disintegrating tablet 4 mg  4 mg Oral Q6H PRN Bobbitt, Shalon E, NP       thiamine (VITAMIN B1) tablet 100 mg  100 mg Oral Daily Bobbitt, Shalon E, NP   100 mg at 07/01/23 0905   traZODone (DESYREL) tablet 50 mg  50 mg Oral QHS PRN Bobbitt, Shalon E, NP       Current Outpatient Medications  Medication Sig Dispense Refill   amLODipine (NORVASC) 10 MG tablet Take 10 mg by mouth daily.     chlorproMAZINE (THORAZINE) 25 MG tablet Take 25 mg by mouth 2 (two) times daily.     clotrimazole (LOTRIMIN) 1 % cream Apply 1 Application topically 2 (two) times daily.     doxepin (SINEQUAN) 150 MG capsule Take 150 mg by mouth at bedtime.     ibuprofen (ADVIL) 800 MG tablet Take 800 mg by mouth 3 (three) times daily.     tamsulosin (FLOMAX) 0.4 MG CAPS capsule Take 0.4 mg by mouth daily.     amLODipine (NORVASC) 5 MG tablet Take 1 tablet (5 mg total) by mouth daily. 30 tablet 0   ferrous sulfate 325 (65 FE) MG tablet Take 1 tablet (325 mg total) by mouth daily with breakfast. 30 tablet 0   gabapentin (NEURONTIN) 300 MG capsule Take 1 capsule (300 mg total) by mouth 3 (three) times daily. 90 capsule 1   Multiple Vitamin (MULTIVITAMIN WITH MINERALS) TABS tablet Take 1 tablet by mouth daily.     naltrexone (DEPADE) 50 MG tablet Take 50 mg by mouth daily.     omeprazole (PRILOSEC) 20 MG capsule Take 1 capsule (20 mg total) by mouth daily. 30 capsule 0   QUEtiapine (SEROQUEL) 50 MG tablet Take 1 tablet (50 mg total) by mouth at bedtime. 30 tablet 0   sertraline (ZOLOFT) 25 MG tablet Take 1 tablet (25 mg total) by mouth daily. 30 tablet 0   sucralfate (CARAFATE) 1 g tablet Take 1 tablet (1 g total) by mouth 4 (four) times daily -  with meals and at bedtime. 120 tablet 0   traZODone (DESYREL) 50 MG tablet Take 1 tablet (50 mg total) by mouth at bedtime as needed for sleep. 30  tablet 0    PTA Medications:  Facility Ordered Medications  Medication   acetaminophen (TYLENOL) tablet 650 mg   alum & mag hydroxide-simeth (MAALOX/MYLANTA) 200-200-20 MG/5ML suspension 30 mL   magnesium hydroxide (MILK OF MAGNESIA) suspension 30 mL   traZODone (DESYREL) tablet 50 mg   [COMPLETED] thiamine (VITAMIN B1) injection 100 mg   thiamine (VITAMIN B1) tablet 100 mg   multivitamin with minerals tablet 1 tablet   LORazepam (ATIVAN) tablet 1 mg   hydrOXYzine (ATARAX) tablet 25 mg  loperamide (IMODIUM) capsule 2-4 mg   ondansetron (ZOFRAN-ODT) disintegrating tablet 4 mg   LORazepam (ATIVAN) tablet 1 mg   Followed by   Melene Muller ON 07/02/2023] LORazepam (ATIVAN) tablet 1 mg   Followed by   Melene Muller ON 07/03/2023] LORazepam (ATIVAN) tablet 1 mg   Followed by   Melene Muller ON 07/05/2023] LORazepam (ATIVAN) tablet 1 mg   PTA Medications  Medication Sig   chlorproMAZINE (THORAZINE) 25 MG tablet Take 25 mg by mouth 2 (two) times daily.   clotrimazole (LOTRIMIN) 1 % cream Apply 1 Application topically 2 (two) times daily.   doxepin (SINEQUAN) 150 MG capsule Take 150 mg by mouth at bedtime.   ibuprofen (ADVIL) 800 MG tablet Take 800 mg by mouth 3 (three) times daily.   tamsulosin (FLOMAX) 0.4 MG CAPS capsule Take 0.4 mg by mouth daily.   amLODipine (NORVASC) 10 MG tablet Take 10 mg by mouth daily.   gabapentin (NEURONTIN) 300 MG capsule Take 1 capsule (300 mg total) by mouth 3 (three) times daily.   sucralfate (CARAFATE) 1 g tablet Take 1 tablet (1 g total) by mouth 4 (four) times daily -  with meals and at bedtime.   Multiple Vitamin (MULTIVITAMIN WITH MINERALS) TABS tablet Take 1 tablet by mouth daily.   naltrexone (DEPADE) 50 MG tablet Take 50 mg by mouth daily.       07/01/2023    9:05 AM 05/12/2023    2:17 PM 05/10/2023    1:07 PM  Depression screen PHQ 2/9  Decreased Interest 1 0 0  Down, Depressed, Hopeless 1 0 3  PHQ - 2 Score 2 0 3  Altered sleeping 1 0 3  Tired, decreased  energy 1 0 3  Change in appetite 0 0 3  Feeling bad or failure about yourself  1 0 3  Trouble concentrating 1 0 3  Moving slowly or fidgety/restless 1 0 3  Suicidal thoughts 1 0 3  PHQ-9 Score 8 0 24  Difficult doing work/chores Very difficult  Very difficult    Flowsheet Row ED from 06/30/2023 in [facility] Most recent reading at 07/01/2023  9:05 AM ED from 05/07/2023 in [facility] Most recent reading at 05/07/2023 12:33 PM ED from 05/07/2023 in [facility] Most recent reading at 05/07/2023  8:12 AM  C-SSRS RISK CATEGORY Error: Q3, 4, or 5 should not be populated when Q2 is No No Risk No Risk       Musculoskeletal  Strength & Muscle Tone: within normal limits Gait & Station: normal Patient leans: N/A  Psychiatric Specialty Exam  Presentation  General Appearance:  Disheveled  Eye Contact: Fair  Speech: Pressured  Speech Volume: Normal  Handedness: Right   Mood and Affect  Mood: Anxious; Depressed  Affect: Congruent   Thought Process  Thought Processes: Coherent; Goal Directed  Descriptions of Associations:Intact  Orientation:Full (Time, Place and Person)  Thought Content:Logical  Diagnosis of Schizophrenia or Schizoaffective disorder in past: Yes  Duration of Psychotic Symptoms: Greater than six months   Hallucinations:Hallucinations: None  Ideas of Reference:None  Suicidal Thoughts:Suicidal Thoughts: No SI Active Intent and/or Plan: With Intent; With Plan SI Passive Intent and/or Plan: With Intent; With Plan  Homicidal Thoughts:Homicidal Thoughts: No HI Active Intent and/or Plan: With Intent; With Plan   Sensorium  Memory: Immediate Fair; Recent Fair; Remote Fair  Judgment: Fair  Insight: Fair   Chartered certified accountant: Fair  Attention Span: Fair  Recall: Fair  Fund of Knowledge: Fair  Language: Fair   Psychomotor Activity   Psychomotor Activity: Psychomotor Activity: Decreased; Restlessness   Assets  Assets: Manufacturing systems engineer; Desire for Improvement   Sleep  Sleep: Sleep: Poor Number of Hours of Sleep: 3   Nutritional Assessment (For OBS and FBC admissions only) Has the patient had a weight loss or gain of 10 pounds or more in the last 3 months?: No Has the patient had a decrease in food intake/or appetite?: No Does the patient have dental problems?: No Does the patient have eating habits or behaviors that may be indicators of an eating disorder including binging or inducing vomiting?: No Has the patient recently lost weight without trying?: 0 Has the patient been eating poorly because of a decreased appetite?: 0 Malnutrition Screening Tool Score: 0    Physical Exam  Physical Exam ROS Blood pressure (!) 150/66, pulse (!) 106, temperature 99.2 F (37.3 C), temperature source Oral, resp. rate 17, SpO2 93%. There is no height or weight on file to calculate BMI.  Demographic Factors:  Age 49 or older, Caucasian, Low socioeconomic status, and Unemployed  Loss Factors: Decrease in vocational status, Decline in physical health, and Financial problems/change in socioeconomic status  Historical Factors: Prior suicide attempts and Impulsivity  Risk Reduction Factors:   NA  Continued Clinical Symptoms:  Depression:   Impulsivity Alcohol/Substance Abuse/Dependencies Schizophrenia:   Depressive state Medical Diagnoses and Treatments/Surgeries  Cognitive Features That Contribute To Risk:  None    Suicide Risk:  Minimal: No identifiable suicidal ideation.  Patients presenting with no risk factors but with morbid ruminations; may be classified as minimal risk based on the severity of the depressive symptoms   Olin Pia, NP 07/01/2023, 9:11 AM

## 2023-07-01 NOTE — ED Notes (Signed)
Patient observed in his bedroom, lying down, resting. Incontinent episode. Clean pair of pants provided.
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
## 2023-07-01 NOTE — ED Notes (Signed)
Patient A&Ox4. Denies intent to harm self/others when asked. Denies A/VH. No acute distress noted. Support and encouragement provided. Routine safety checks conducted according to facility protocol. Encouraged patient to notify staff if thoughts of harm toward self or others arise. Endorses safety. Patient verbalized understanding and agreement. Will continue to monitor for safety.

## 2023-07-01 NOTE — ED Triage Notes (Signed)
Pt BIB [facility] from [facility] d/t fever. Pt was there for ETOH abuse. Had 325 mg tylenol around 1640 today. Temp 103.5 orally w [facility].   BP 164/88 [id number]

## 2023-07-01 NOTE — ED Notes (Signed)
Pt resting quietly with eyes closed.  No pain or discomfort noted/voiced.  Breathing is even and unlabored.  Will continue to monitor for safety.

## 2023-07-01 NOTE — ED Provider Notes (Signed)
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 [place] [address]   Lipase 02/10/2023 77 (H)  11 - 51 U/L Final   Performed at [facility], [address], [place], [place] [address]   Color, Urine 02/11/2023 YELLOW  YELLOW Final   APPearance 02/11/2023 CLEAR  CLEAR Final   Specific Gravity, Urine 02/11/2023 1.011  1.005 - 1.030 Final    pH 02/11/2023 5.0  5.0 - 8.0 Final   Glucose, UA 02/11/2023 NEGATIVE  NEGATIVE mg/dL Final   Hgb urine dipstick 02/11/2023 NEGATIVE  NEGATIVE Final   Bilirubin Urine 02/11/2023 NEGATIVE  NEGATIVE Final   Ketones, ur 02/11/2023 NEGATIVE  NEGATIVE mg/dL Final   Protein, ur 47/42/5956 30 (A)  NEGATIVE mg/dL Final   Nitrite 38/75/6433 NEGATIVE  NEGATIVE Final   Leukocytes,Ua 02/11/2023 NEGATIVE  NEGATIVE Final   RBC / HPF 02/11/2023 0-5  0 - 5 RBC/hpf Final   WBC, UA 02/11/2023 0-5  0 - 5 WBC/hpf Final   Bacteria, UA 02/11/2023 NONE SEEN  NONE SEEN Final   Squamous Epithelial / HPF 02/11/2023 0-5  0 - 5 /HPF Final   Mucus 02/11/2023 PRESENT   Final   Performed at [facility], [address], [place], [place] [address]   Alcohol, Ethyl (B) 02/10/2023 205 (H)  <10 mg/dL Final   Comment: (NOTE) Lowest detectable limit for serum alcohol is 10 mg/dL.  For medical purposes only. Performed at [facility], [address] [place], [place] [address]    Opiates 02/11/2023 NONE DETECTED  NONE DETECTED Final   Cocaine 02/11/2023 NONE DETECTED  NONE DETECTED Final   Benzodiazepines 02/11/2023 NONE DETECTED  NONE DETECTED Final   Amphetamines 02/11/2023 NONE DETECTED  NONE DETECTED Final   Tetrahydrocannabinol 02/11/2023 NONE DETECTED  NONE DETECTED Final   Barbiturates 02/11/2023 NONE DETECTED  NONE DETECTED Final   Comment: (NOTE) DRUG SCREEN FOR MEDICAL PURPOSES ONLY.  IF CONFIRMATION IS NEEDED FOR ANY PURPOSE, NOTIFY LAB WITHIN 5 DAYS.  LOWEST DETECTABLE LIMITS FOR URINE DRUG SCREEN Drug Class                     Cutoff (ng/mL) Amphetamine and metabolites    1000 Barbiturate and metabolites    200 Benzodiazepine                 200 Opiates and metabolites        300 Cocaine and metabolites        300 THC                            50 Performed at [facility], [address], [place], [place] [address]   Admission on  01/24/2023, Discharged on 01/24/2023  Component Date Value Ref Range Status   Sodium 01/24/2023 139  135 - 145 mmol/L Final   Potassium 01/24/2023 3.9  3.5 - 5.1 mmol/L Final   Chloride 01/24/2023 107  98 - 111 mmol/L Final   CO2 01/24/2023 24  22 - 32 mmol/L Final   Glucose, Bld 01/24/2023 86  70 - 99 mg/dL Final   Glucose reference range applies only to samples taken after fasting for at  least 8 hours.   BUN 01/24/2023 12  8 - 23 mg/dL Final   Creatinine, Ser 01/24/2023 0.85  0.61 - 1.24 mg/dL Final   Calcium 09/81/1914 8.4 (L)  8.9 - 10.3 mg/dL Final   Total Protein 78/29/5621 7.6  6.5 - 8.1 g/dL Final   Albumin 30/86/5784 3.4 (L)  3.5 - 5.0 g/dL Final   AST 69/62/9528 59 (H)  15 - 41 U/L Final   ALT 01/24/2023 30  0 - 44 U/L Final   Alkaline Phosphatase 01/24/2023 64  38 - 126 U/L Final   Total Bilirubin 01/24/2023 1.3 (H)  0.3 - 1.2 mg/dL Final   GFR, Estimated 01/24/2023 >60  >60 mL/min Final   Comment: (NOTE) Calculated using the CKD-EPI Creatinine Equation (2021)    Anion gap 01/24/2023 8  5 - 15 Final   Performed at [facility], [address], [place], [place] [address]   Alcohol, Ethyl (B) 01/24/2023 249 (H)  <10 mg/dL Final   Comment: (NOTE) Lowest detectable limit for serum alcohol is 10 mg/dL.  For medical purposes only. Performed at [facility], [address], [place], [place] [address]    Troponin I (High Sensitivity) 01/24/2023 5  <18 ng/L Final   Comment: (NOTE) Elevated high sensitivity troponin I (hsTnI) values and significant  changes across serial measurements may suggest ACS but many other  chronic and acute conditions are known to elevate hsTnI results.  Refer to the "Links" section for chest pain algorithms and additional  guidance. Performed at [facility], [address] [place], [place] [address]    WBC 01/24/2023 4.5  4.0 - 10.5 K/uL Final   RBC 01/24/2023 5.20  4.22 - 5.81 MIL/uL  Final   Hemoglobin 01/24/2023 12.2 (L)  13.0 - 17.0 g/dL Final   HCT 64/40/3474 40.4  39.0 - 52.0 % Final   MCV 01/24/2023 77.7 (L)  80.0 - 100.0 fL Final   MCH 01/24/2023 23.5 (L)  26.0 - 34.0 pg Final   MCHC 01/24/2023 30.2  30.0 - 36.0 g/dL Final   RDW 25/95/6387 19.4 (H)  11.5 - 15.5 % Final   Platelets 01/24/2023 133 (L)  150 - 400 K/uL Final   nRBC 01/24/2023 0.0  0.0 - 0.2 % Final   Neutrophils Relative % 01/24/2023 48  % Final   Neutro Abs 01/24/2023 2.2  1.7 - 7.7 K/uL Final   Lymphocytes Relative 01/24/2023 39  % Final   Lymphs Abs 01/24/2023 1.8  0.7 - 4.0 K/uL Final   Monocytes Relative 01/24/2023 9  % Final   Monocytes Absolute 01/24/2023 0.4  0.1 - 1.0 K/uL Final   Eosinophils Relative 01/24/2023 3  % Final   Eosinophils Absolute 01/24/2023 0.1  0.0 - 0.5 K/uL Final   Basophils Relative 01/24/2023 1  % Final   Basophils Absolute 01/24/2023 0.1  0.0 - 0.1 K/uL Final   Immature Granulocytes 01/24/2023 0  % Final   Abs Immature Granulocytes 01/24/2023 0.00  0.00 - 0.07 K/uL Final   Performed at [facility], [address] [place], [place] [address]   Prothrombin Time 01/24/2023 13.8  11.4 - 15.2 seconds Final   INR 01/24/2023 1.1  0.8 - 1.2 Final   Comment: (NOTE) INR goal varies based on device and disease states. Performed at [facility], [address], [place], [place] [address]    ABO/RH(D) 01/24/2023 O POS   Final   Antibody Screen 01/24/2023 NEG  Final   Sample Expiration 01/24/2023    Final                   Value:01/27/2023,2359 Performed at [facility], [address] [place], [place] [address]    Opiates 01/24/2023 NONE DETECTED  NONE DETECTED Final   Cocaine 01/24/2023 NONE DETECTED  NONE DETECTED Final   Benzodiazepines 01/24/2023 NONE DETECTED  NONE DETECTED Final   Amphetamines 01/24/2023 NONE DETECTED  NONE DETECTED Final   Tetrahydrocannabinol 01/24/2023 NONE DETECTED  NONE DETECTED Final    Barbiturates 01/24/2023 NONE DETECTED  NONE DETECTED Final   Comment: (NOTE) DRUG SCREEN FOR MEDICAL PURPOSES ONLY.  IF CONFIRMATION IS NEEDED FOR ANY PURPOSE, NOTIFY LAB WITHIN 5 DAYS.  LOWEST DETECTABLE LIMITS FOR URINE DRUG SCREEN Drug Class                     Cutoff (ng/mL) Amphetamine and metabolites    1000 Barbiturate and metabolites    200 Benzodiazepine                 200 Opiates and metabolites        300 Cocaine and metabolites        300 THC                            50 Performed at [facility], [address] [place], [place] [address]   Admission on 01/09/2023, Discharged on 01/09/2023  Component Date Value Ref Range Status   Sodium 01/09/2023 135  135 - 145 mmol/L Final   Potassium 01/09/2023 3.8  3.5 - 5.1 mmol/L Final   Chloride 01/09/2023 97 (L)  98 - 111 mmol/L Final   CO2 01/09/2023 27  22 - 32 mmol/L Final   Glucose, Bld 01/09/2023 101 (H)  70 - 99 mg/dL Final   Glucose reference range applies only to samples taken after fasting for at least 8 hours.   BUN 01/09/2023 7 (L)  8 - 23 mg/dL Final   Creatinine, Ser 01/09/2023 0.77  0.61 - 1.24 mg/dL Final   Calcium 62/13/0865 8.9  8.9 - 10.3 mg/dL Final   Total Protein 78/46/9629 8.5 (H)  6.5 - 8.1 g/dL Final   Albumin 52/84/1324 3.9  3.5 - 5.0 g/dL Final   AST 40/07/2724 91 (H)  15 - 41 U/L Final   ALT 01/09/2023 69 (H)  0 - 44 U/L Final   Alkaline Phosphatase 01/09/2023 79  38 - 126 U/L Final   Total Bilirubin 01/09/2023 2.1 (H)  0.3 - 1.2 mg/dL Final   GFR, Estimated 01/09/2023 >60  >60 mL/min Final   Comment: (NOTE) Calculated using the CKD-EPI Creatinine Equation (2021)    Anion gap 01/09/2023 11  5 - 15 Final   Performed at [facility], [address], [place], [place] [address]   Alcohol, Ethyl (B) 01/09/2023 258 (H)  <10 mg/dL Final   Comment: (NOTE) Lowest detectable limit for serum alcohol is 10 mg/dL.  For medical purposes only. Performed at  [facility], [address], [place], [place] [address]    Salicylate Lvl 01/09/2023 <7.0 (L)  7.0 - 30.0 mg/dL Final   Performed at [facility], [address] [place], [place] [address]   Acetaminophen (Tylenol), Serum 01/09/2023 <10 (L)  10 - 30 ug/mL Final   Comment: (NOTE) Therapeutic concentrations vary significantly. A range of 10-30  ug/mL  may be an effective concentration for many patients. However, some  are best treated at concentrations outside of this range. Acetaminophen concentrations >150 ug/mL at 4 hours after ingestion  and >50 ug/mL at 12 hours after ingestion are often associated with  toxic reactions.  Performed at [facility], [address], [place], [place] [address]    WBC 01/09/2023 4.3  4.0 - 10.5 K/uL Final   RBC 01/09/2023 5.64  4.22 - 5.81 MIL/uL Final   Hemoglobin 01/09/2023 13.2  13.0 - 17.0 g/dL Final   HCT 24/40/1027 43.0  39.0 - 52.0 % Final   MCV 01/09/2023 76.2 (L)  80.0 - 100.0 fL Final   MCH 01/09/2023 23.4 (L)  26.0 - 34.0 pg Final   MCHC 01/09/2023 30.7  30.0 - 36.0 g/dL Final   RDW 25/36/6440 18.7 (H)  11.5 - 15.5 % Final   Platelets 01/09/2023 96 (L)  150 - 400 K/uL Final   Comment: SPECIMEN CHECKED FOR CLOTS REPEATED TO VERIFY PLATELET COUNT CONFIRMED BY SMEAR    nRBC 01/09/2023 0.0  0.0 - 0.2 % Final   Performed at [facility], [address] [place], [place] [address]   Opiates 01/09/2023 NONE DETECTED  NONE DETECTED Final   Cocaine 01/09/2023 NONE DETECTED  NONE DETECTED Final   Benzodiazepines 01/09/2023 NONE DETECTED  NONE DETECTED Final   Amphetamines 01/09/2023 NONE DETECTED  NONE DETECTED Final   Tetrahydrocannabinol 01/09/2023 NONE DETECTED  NONE DETECTED Final   Barbiturates 01/09/2023 NONE DETECTED  NONE DETECTED Final   Comment: (NOTE) DRUG SCREEN FOR MEDICAL PURPOSES ONLY.  IF CONFIRMATION IS NEEDED FOR ANY PURPOSE, NOTIFY LAB WITHIN 5  DAYS.  LOWEST DETECTABLE LIMITS FOR URINE DRUG SCREEN Drug Class                     Cutoff (ng/mL) Amphetamine and metabolites    1000 Barbiturate and metabolites    200 Benzodiazepine                 200 Opiates and metabolites        300 Cocaine and metabolites        300 THC                            50 Performed at [facility], [address] [place], [place] [address]    SARS Coronavirus 2 by RT PCR 01/09/2023 NEGATIVE  NEGATIVE Final   Comment: (NOTE) SARS-CoV-2 target nucleic acids are NOT DETECTED.  The SARS-CoV-2 RNA is generally detectable in upper respiratory specimens during the acute phase of infection. The lowest concentration of SARS-CoV-2 viral copies this assay can detect is 138 copies/mL. A negative result does not preclude SARS-Cov-2 infection and should not be used as the sole basis for treatment or other patient management decisions. A negative result may occur with  improper specimen collection/handling, submission of specimen other than nasopharyngeal swab, presence of viral mutation(s) within the areas targeted by this assay, and inadequate number of viral copies(<138 copies/mL). A negative result must be combined with clinical observations, patient history, and epidemiological information. The expected result is Negative.  Fact Sheet for Patients:  BloggerCourse.com  Fact Sheet for Healthcare Providers:  SeriousBroker.it  This test is no                          t yet  approved or cleared by the [place] and  has been authorized for detection and/or diagnosis of SARS-CoV-2 by FDA under an Emergency Use Authorization (EUA). This EUA will remain  in effect (meaning this test can be used) for the duration of the COVID-19 declaration under Section 564(b)(1) of the Act, 21 U.S.C.section 360bbb-3(b)(1), unless the authorization is terminated  or revoked sooner.        Influenza A by PCR 01/09/2023 NEGATIVE  NEGATIVE Final   Influenza B by PCR 01/09/2023 NEGATIVE  NEGATIVE Final   Comment: (NOTE) The Xpert Xpress SARS-CoV-2/FLU/RSV plus assay is intended as an aid in the diagnosis of influenza from Nasopharyngeal swab specimens and should not be used as a sole basis for treatment. Nasal washings and aspirates are unacceptable for Xpert Xpress SARS-CoV-2/FLU/RSV testing.  Fact Sheet for Patients: BloggerCourse.com  Fact Sheet for Healthcare Providers: SeriousBroker.it  This test is not yet approved or cleared by the [place] FDA and has been authorized for detection and/or diagnosis of SARS-CoV-2 by FDA under an Emergency Use Authorization (EUA). This EUA will remain in effect (meaning this test can be used) for the duration of the COVID-19 declaration under Section 564(b)(1) of the Act, 21 U.S.C. section 360bbb-3(b)(1), unless the authorization is terminated or revoked.     Resp Syncytial Virus by PCR 01/09/2023 NEGATIVE  NEGATIVE Final   Comment: (NOTE) Fact Sheet for Patients: BloggerCourse.com  Fact Sheet for Healthcare Providers: SeriousBroker.it  This test is not yet approved or cleared by the [place] FDA and has been authorized for detection and/or diagnosis of SARS-CoV-2 by FDA under an Emergency Use Authorization (EUA). This EUA will remain in effect (meaning this test can be used) for the duration of the COVID-19 declaration under Section 564(b)(1) of the Act, 21 U.S.C. section 360bbb-3(b)(1), unless the authorization is terminated or revoked.  Performed at [facility], [address], [place], [place] [address]    Cholesterol 01/09/2023 174  0 - 200 mg/dL Final   Triglycerides 69/62/9528 53  <150 mg/dL Final   HDL 41/32/4401 92  >40 mg/dL Final   Total CHOL/HDL Ratio 01/09/2023 1.9  RATIO Final    VLDL 01/09/2023 11  0 - 40 mg/dL Final   LDL Cholesterol 01/09/2023 71  0 - 99 mg/dL Final   Comment:        Total Cholesterol/HDL:CHD Risk Coronary Heart Disease Risk Table                     Men   Women  1/2 Average Risk   3.4   3.3  Average Risk       5.0   4.4  2 X Average Risk   9.6   7.1  3 X Average Risk  23.4   11.0        Use the calculated Patient Ratio above and the CHD Risk Table to determine the patient's CHD Risk.        ATP III CLASSIFICATION (LDL):  <100     mg/dL   Optimal  027-253  mg/dL   Near or Above                    Optimal  130-159  mg/dL   Borderline  664-403  mg/dL   High  >474     mg/dL   Very High Performed at [facility], [address] [place], [place] [address]    TSH 01/09/2023 2.744  0.350 - 4.500 uIU/mL Final   Comment: Performed by a 3rd Generation assay with a functional sensitivity of <=0.01 uIU/mL. Performed at [facility], [address], [place], [place] [address]    Magnesium 01/09/2023 2.1  1.7 - 2.4 mg/dL Final   Performed at [facility], [address] [place], [place] [address]   Color, Urine 01/09/2023 STRAW (A)  YELLOW Final   APPearance 01/09/2023 CLEAR  CLEAR Final   Specific Gravity, Urine 01/09/2023 1.002 (L)  1.005 - 1.030 Final   pH 01/09/2023 6.0  5.0 - 8.0 Final   Glucose, UA 01/09/2023 NEGATIVE  NEGATIVE mg/dL Final   Hgb urine dipstick 01/09/2023 NEGATIVE  NEGATIVE Final   Bilirubin Urine 01/09/2023 NEGATIVE  NEGATIVE Final   Ketones, ur 01/09/2023 NEGATIVE  NEGATIVE mg/dL Final   Protein, ur 86/57/8469 NEGATIVE  NEGATIVE mg/dL Final   Nitrite 62/95/2841 NEGATIVE  NEGATIVE Final   Leukocytes,Ua 01/09/2023 NEGATIVE  NEGATIVE Final   Performed at [facility], [address], [place], [place] [address]   Prolactin 01/09/2023 27.2 (H)  3.6 - 25.2 ng/mL Final   Comment: (NOTE) Performed At: [facility] [address]  [place], [place] [id number] Jolene Schimke MD YQ:[id number]     Allergies: Carrot [daucus carota] and Carrot [daucus carota]  Medications:  Facility Ordered Medications  Medication   [COMPLETED] amLODipine (NORVASC) tablet 5 mg   alum & mag hydroxide-simeth (MAALOX/MYLANTA) 200-200-20 MG/5ML suspension 30 mL   magnesium hydroxide (MILK OF MAGNESIA) suspension 30 mL   traZODone (DESYREL) tablet 50 mg   gabapentin (NEURONTIN) capsule 300 mg   PTA Medications  Medication Sig   gabapentin (NEURONTIN) 300 MG capsule Take 1 capsule (300 mg total) by mouth 3 (three) times daily.   sertraline (ZOLOFT) 25 MG tablet Take 1 tablet (25 mg total) by mouth daily. (Patient not taking: Reported on 07/01/2023)   traZODone (DESYREL) 50 MG tablet Take 1 tablet (50 mg total) by mouth at bedtime as needed for sleep. (Patient not taking: Reported on 07/01/2023)   Multiple Vitamin (MULTIVITAMIN WITH MINERALS) TABS tablet Take 1 tablet by mouth daily.   QUEtiapine (SEROQUEL) 50 MG tablet Take 1 tablet (50 mg total) by mouth at bedtime. (Patient not taking: Reported on 07/01/2023)   omeprazole (PRILOSEC) 20 MG capsule Take 1 capsule (20 mg total) by mouth daily.   chlorproMAZINE (THORAZINE) 25 MG tablet Take 25 mg by mouth 2 (two) times daily.   doxepin (SINEQUAN) 150 MG capsule Take 150 mg by mouth at bedtime.   ibuprofen (ADVIL) 800 MG tablet Take 800 mg by mouth 3 (three) times daily.   naltrexone (DEPADE) 50 MG tablet Take 50 mg by mouth daily. (Patient not taking: Reported on 07/01/2023)   tamsulosin (FLOMAX) 0.4 MG CAPS capsule Take 0.4 mg by mouth daily. (Patient not taking: Reported on 07/01/2023)   amLODipine (NORVASC) 10 MG tablet Take 10 mg by mouth daily.   hydrocortisone cream 1 % Apply 1 Application topically 2 (two) times daily.    Long Term Goals: Improvement in symptoms so as ready for discharge  Short Term Goals: Patient will verbalize feelings in meetings with treatment team members., Patient  will attend at least of 50% of the groups daily., Pt will complete the PHQ9 on admission, day 3 and discharge., Patient will participate in completing the [place] Suicide Severity Rating Scale, Patient will score a low risk of violence for 24 hours prior to discharge, and Patient will take medications  as prescribed daily.  Medical Decision Making  Patient represents to [facility] after relapsing on alcohol. He had been to [employer] [facility] [employer] in [place] for 35 days and the day he discharged, he resumed drinking. He reports drinking a fifth of liquor or 40 oz of beer a day. He continues to deal with sheltered homelessness and is his primary focus as opposed to his alcohol use. Will continue home medications gabapentin and amlodipine. CIWA protocol initiated with PRN librium. Will monitor whether patient will actually require scheduled benzodiazepine taper. Labs reassuring, no signs of alcoholic hepatitis.   #Alcohol Use Disorder Start CIWA protocol (CIWA 10>6) -chlordiazepoxide (LIbrium) 25 mg every 6 hours as needed for CIWA greater than 10;  --Hydroxyzine 25 mg for CIWA less than 10 -Multivitamin with minerals 1 tablet daily -Ondansetron disintegrating tablet 4 mg every 6 as needed/nausea or vomiting -Loperamide 2 to 4 mg oral as needed/diarrhea or loose stools -Thiamine injection 100 mg IM once -Thiamine tablet 100 mg daily -- Continue home Gabapentin 300 mg TID. May titrate as clinically required.  #HTN --Continue home amlodipine 10 mg daily     Recommendations  Based on my evaluation the patient does not appear to have an emergency medical condition.  Park Pope, MD 07/01/23  10:40 AM

## 2023-07-01 NOTE — ED Notes (Addendum)
Patient seen in the dining room stumbling slightly. Asked if was lightheaded and patient stated yes.Could feel heat coming from pt's back while assisting with ambulation. Assisted patient to his room and took vitals. Axillary temp obtained due to pt drinking cold beverage. Temp was 102.4 , pulse 114, BP 169/86. Provider notified and Tylenol given. Patient advised to not ambulate without assistance. Walker provided however pt uses this incorrectly. Patient has had multiple incontinent episodes and refuses to wear a brief. Contacted Charge nurse regarding the need for 1 to 1. Patient is oriented to self, time, place, and situation.

## 2023-07-01 NOTE — ED Notes (Signed)
Patient is sleeping. Respirations equal and unlabored, skin warm and dry, NAD. No change in assessment or acuity. Routine safety checks conducted according to facility protocol. Will continue to monitor for safety.
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
## 2023-07-01 NOTE — ED Provider Notes (Signed)
[place] EMERGENCY DEPARTMENT AT [facility] Provider Note   CSN: [id number] Arrival date & time: 07/01/23  1835     History  Chief Complaint  Patient presents with   Fever    [name] is a 67 y.o. male history of alcohol abuse, depression, psychoactive substance induced organic mood disorder, hepatitis C, hypertension, cirrhosis, CVA, suicide attempt presented for fever.  Patient was at [facility] when they noticed a to fever 37 F and was referred to the emergency department.  Patient has over the past 2 days his abdomen is been hurting specifically in suprapubic and right upper quadrant area.  Patient states he still has his gallbladder.  Patient denies any emesis or hematemesis.  Patient denies chest pain, shortness of breath.  Patient denies dysuria or flank pain.  Patient given Tylenol and referred to ER.  Patient denies sick contacts, hematuria, nausea/vomiting, diarrhea, change sensation/motor skills, headache, cough, neck pain  Home Medications Prior to Admission medications   Medication Sig Start Date End Date Taking? Authorizing Provider  amLODipine (NORVASC) 10 MG tablet Take 10 mg by mouth daily. 06/27/23   [provider]  chlorproMAZINE (THORAZINE) 25 MG tablet Take 25 mg by mouth 2 (two) times daily. 06/14/23   [provider]  doxepin (SINEQUAN) 150 MG capsule Take 150 mg by mouth at bedtime. 06/26/23   [provider]  gabapentin (NEURONTIN) 300 MG capsule Take 1 capsule (300 mg total) by mouth 3 (three) times daily. 04/03/23   Diamantina Monks, MD  hydrocortisone cream 1 % Apply 1 Application topically 2 (two) times daily. 05/14/23   [provider]  ibuprofen (ADVIL) 800 MG tablet Take 800 mg by mouth 3 (three) times daily. 06/27/23   [provider]  Multiple Vitamin (MULTIVITAMIN WITH MINERALS) TABS tablet Take 1 tablet by mouth daily. 05/12/23   Karie Fetch, MD  naltrexone (DEPADE) 50 MG tablet Take 50 mg by  mouth daily. Patient not taking: Reported on 07/01/2023    [provider]  omeprazole (PRILOSEC) 20 MG capsule Take 1 capsule (20 mg total) by mouth daily. 05/12/23 07/01/23  Karie Fetch, MD  QUEtiapine (SEROQUEL) 50 MG tablet Take 1 tablet (50 mg total) by mouth at bedtime. Patient not taking: Reported on 07/01/2023 05/12/23 06/11/23  Karie Fetch, MD  sertraline (ZOLOFT) 25 MG tablet Take 1 tablet (25 mg total) by mouth daily. Patient not taking: Reported on 07/01/2023 05/12/23 06/11/23  Karie Fetch, MD  tamsulosin (FLOMAX) 0.4 MG CAPS capsule Take 0.4 mg by mouth daily. Patient not taking: Reported on 07/01/2023 06/27/23   [provider]  traZODone (DESYREL) 50 MG tablet Take 1 tablet (50 mg total) by mouth at bedtime as needed for sleep. Patient not taking: Reported on 07/01/2023 05/12/23 06/11/23  Karie Fetch, MD      Allergies    Carrot [daucus carota] and Carrot [daucus carota]    Review of Systems   Review of Systems  Constitutional:  Positive for fever.    Physical Exam Updated Vital Signs BP (!) 150/77   Pulse (!) 101   Temp (!) 100.7 F (38.2 C) (Oral)   Resp 18   Ht 5\' 9"  (1.753 m)   Wt 77.1 kg   SpO2 94%   BMI 25.10 kg/m  Physical Exam Constitutional:      Appearance: He is ill-appearing and toxic-appearing.  Eyes:     Extraocular Movements: Extraocular movements intact.     Conjunctiva/sclera: Conjunctivae normal.  Pupils: Pupils are equal, round, and reactive to light.  Cardiovascular:     Rate and Rhythm: Regular rhythm. Tachycardia present.     Pulses: Normal pulses.     Heart sounds: Normal heart sounds.  Pulmonary:     Effort: Pulmonary effort is normal. No respiratory distress.     Breath sounds: Normal breath sounds.     Comments: Nonproductive cough Abdominal:     Palpations: Abdomen is soft.     Tenderness: There is no rebound.     Comments: Right upper quadrant suprapubic tenderness guarding along generalized  tenderness  Musculoskeletal:        General: Normal range of motion.  Skin:    General: Skin is warm and dry.     Capillary Refill: Capillary refill takes less than 2 seconds.  Neurological:     Mental Status: He is alert and oriented to person, place, and time.  Psychiatric:        Mood and Affect: Mood normal.     ED Results / Procedures / Treatments   Labs (all labs ordered are listed, but only abnormal results are displayed) Labs Reviewed  URINALYSIS, ROUTINE W REFLEX MICROSCOPIC - Abnormal; Notable for the following components:      Result Value   APPearance CLOUDY (*)    Hgb urine dipstick SMALL (*)    Protein, ur 30 (*)    Leukocytes,Ua LARGE (*)    All other components within normal limits  URINALYSIS, MICROSCOPIC (REFLEX) - Abnormal; Notable for the following components:   Bacteria, UA MANY (*)    All other components within normal limits  SARS CORONAVIRUS 2 BY RT PCR  CBC WITH DIFFERENTIAL/PLATELET  COMPREHENSIVE METABOLIC PANEL  LIPASE, BLOOD    EKG None  Radiology No results found.  Procedures .Critical Care  Performed by: Netta Corrigan, PA-C Authorized by: Netta Corrigan, PA-C   Critical care provider statement:    Critical care time (minutes):  30   Critical care time was exclusive of:  Separately billable procedures and treating other patients   Critical care was necessary to treat or prevent imminent or life-threatening deterioration of the following conditions:  Sepsis   Critical care was time spent personally by me on the following activities:  Blood draw for specimens, development of treatment plan with patient or surrogate, evaluation of patient's response to treatment, examination of patient, obtaining history from patient or surrogate, review of old charts, re-evaluation of patient's condition, ordering and review of radiographic studies, pulse oximetry, ordering and review of laboratory studies and ordering and performing treatments and  interventions   I assumed direction of critical care for this patient from another provider in my specialty: no     Care discussed with: admitting provider       Medications Ordered in ED Medications  lactated ringers bolus 1,000 mL (has no administration in time range)    ED Course/ Medical Decision Making/ A&P                                 Medical Decision Making Amount and/or Complexity of Data Reviewed Labs: ordered. Radiology: ordered.  Risk Prescription drug management.   [name] 67 y.o. presented today for fever. Working DDx that I considered at this time includes, but not limited to, COVID/viral, UTI, pneumonia, pyelonephritis, cholecystitis, choledocholithiasis, cholangitis, diverticulitis, electrolyte abnormalities, dehydration.  R/o DDx: pyelonephritis, cholecystitis, choledocholithiasis, cholangitis, diverticulitis, electrolyte abnormalities,  dehydration: These are considered less likely due to history of present illness, physical exam, labs/imaging findings  Review of prior external notes: 07/01/2023 ED provider notes  Unique Tests and My Interpretation:  UA: Large leukocytes, small hemoglobin, many bacteria CBC: Leukocytosis 12.9 CMP: Total bilirubin 1.8 likely from alcohol use COVID: Negative Lipase: Markable Urine culture: Pending Chest x-ray: Right patchy upper lobar infiltrate CT abdomen pelvis with contrast: No acute findings Blood cultures: Pending  Discussion with Independent Historian: None  Discussion of Management of Tests:  Sundil, MD Hospitalist  Risk: High: hospitalization or escalation of hospital-level care  Risk Stratification Score: none  Staffed with Plunkett, MD  Plan: On exam patient was resting comfortably however was noted to be slightly tachycardic at 101 and febrile 100.7 F.  Patient did have generalized tenderness along with guarding the right upper quadrant and suprapubic region.  Patient still has gallbladder.   Patient did not have any CVA tenderness however his urine from triage does show many leukocytes and bacteria suspicious of possible UTI or pyelonephritis causing his symptoms however it would be unusual to have abdominal tenderness with this and so CT scan was ordered along with abdominal labs.  Patient given fluids.  Patient's temp has come down with the Tylenol from [facility] and will continue to be monitored.  Do not suspect sepsis at this time.  Patient's labs and imaging have come back pending CT scan.  Chest x-ray does show infiltrates suspicious of pneumonia.  Patient does have white count and continues to be febrile here in the ER and so code sepsis was initiated.  Blood cultures will be obtained and patient was placed on Rocephin azithromycin as this will cover for both pneumonia and UTI/pyelo as we do not have the CT scan back yet.  Anticipate admission due to pneumonia along with UTI/pyelonephritis and appearing very ill.  I spoke to the hospitalist and patient was accepted for admission.  Patient stable for admission at this time.        Final Clinical Impression(s) / ED Diagnoses Final diagnoses:  None    Rx / DC Orders ED Discharge Orders     None         Remi Deter 07/01/23 2353    Gwyneth Sprout, MD 07/02/23 1759

## 2023-07-01 NOTE — ED Notes (Signed)
Patient made aware that he will be sent to the ED for medical clearance. Report given to ED Charge,Jamie. EMS called for transport.

## 2023-07-02 ENCOUNTER — Encounter (HOSPITAL_COMMUNITY): Payer: Self-pay | Admitting: Internal Medicine

## 2023-07-02 ENCOUNTER — Other Ambulatory Visit: Payer: Self-pay

## 2023-07-02 ENCOUNTER — Inpatient Hospital Stay (HOSPITAL_COMMUNITY): Payer: Medicare HMO

## 2023-07-02 DIAGNOSIS — Z1611 Resistance to penicillins: Secondary | ICD-10-CM | POA: Diagnosis present

## 2023-07-02 DIAGNOSIS — R6510 Systemic inflammatory response syndrome (SIRS) of non-infectious origin without acute organ dysfunction: Secondary | ICD-10-CM | POA: Insufficient documentation

## 2023-07-02 DIAGNOSIS — Z8659 Personal history of other mental and behavioral disorders: Secondary | ICD-10-CM

## 2023-07-02 DIAGNOSIS — D509 Iron deficiency anemia, unspecified: Secondary | ICD-10-CM | POA: Insufficient documentation

## 2023-07-02 DIAGNOSIS — E871 Hypo-osmolality and hyponatremia: Secondary | ICD-10-CM | POA: Insufficient documentation

## 2023-07-02 DIAGNOSIS — Z8619 Personal history of other infectious and parasitic diseases: Secondary | ICD-10-CM | POA: Insufficient documentation

## 2023-07-02 DIAGNOSIS — E876 Hypokalemia: Secondary | ICD-10-CM | POA: Diagnosis present

## 2023-07-02 DIAGNOSIS — R45851 Suicidal ideations: Secondary | ICD-10-CM | POA: Diagnosis present

## 2023-07-02 DIAGNOSIS — F1024 Alcohol dependence with alcohol-induced mood disorder: Secondary | ICD-10-CM | POA: Diagnosis present

## 2023-07-02 DIAGNOSIS — Y900 Blood alcohol level of less than 20 mg/100 ml: Secondary | ICD-10-CM | POA: Diagnosis present

## 2023-07-02 DIAGNOSIS — Z23 Encounter for immunization: Secondary | ICD-10-CM | POA: Diagnosis present

## 2023-07-02 DIAGNOSIS — F10930 Alcohol use, unspecified with withdrawal, uncomplicated: Secondary | ICD-10-CM | POA: Diagnosis not present

## 2023-07-02 DIAGNOSIS — F411 Generalized anxiety disorder: Secondary | ICD-10-CM | POA: Diagnosis present

## 2023-07-02 DIAGNOSIS — N3000 Acute cystitis without hematuria: Secondary | ICD-10-CM

## 2023-07-02 DIAGNOSIS — Z9151 Personal history of suicidal behavior: Secondary | ICD-10-CM | POA: Diagnosis present

## 2023-07-02 DIAGNOSIS — Z1152 Encounter for screening for COVID-19: Secondary | ICD-10-CM | POA: Diagnosis not present

## 2023-07-02 DIAGNOSIS — R4689 Other symptoms and signs involving appearance and behavior: Secondary | ICD-10-CM | POA: Diagnosis not present

## 2023-07-02 DIAGNOSIS — G9341 Metabolic encephalopathy: Secondary | ICD-10-CM | POA: Diagnosis present

## 2023-07-02 DIAGNOSIS — F10239 Alcohol dependence with withdrawal, unspecified: Secondary | ICD-10-CM | POA: Diagnosis present

## 2023-07-02 DIAGNOSIS — F1910 Other psychoactive substance abuse, uncomplicated: Secondary | ICD-10-CM | POA: Diagnosis not present

## 2023-07-02 DIAGNOSIS — N400 Enlarged prostate without lower urinary tract symptoms: Secondary | ICD-10-CM | POA: Insufficient documentation

## 2023-07-02 DIAGNOSIS — E878 Other disorders of electrolyte and fluid balance, not elsewhere classified: Secondary | ICD-10-CM | POA: Diagnosis present

## 2023-07-02 DIAGNOSIS — J189 Pneumonia, unspecified organism: Secondary | ICD-10-CM

## 2023-07-02 DIAGNOSIS — A419 Sepsis, unspecified organism: Secondary | ICD-10-CM | POA: Insufficient documentation

## 2023-07-02 DIAGNOSIS — D696 Thrombocytopenia, unspecified: Secondary | ICD-10-CM | POA: Diagnosis present

## 2023-07-02 DIAGNOSIS — F32A Depression, unspecified: Secondary | ICD-10-CM | POA: Diagnosis not present

## 2023-07-02 DIAGNOSIS — Z5902 Unsheltered homelessness: Secondary | ICD-10-CM | POA: Diagnosis not present

## 2023-07-02 DIAGNOSIS — K7030 Alcoholic cirrhosis of liver without ascites: Secondary | ICD-10-CM | POA: Diagnosis present

## 2023-07-02 DIAGNOSIS — Z5900 Homelessness unspecified: Secondary | ICD-10-CM | POA: Diagnosis not present

## 2023-07-02 DIAGNOSIS — R45850 Homicidal ideations: Secondary | ICD-10-CM | POA: Diagnosis present

## 2023-07-02 DIAGNOSIS — B182 Chronic viral hepatitis C: Secondary | ICD-10-CM | POA: Diagnosis present

## 2023-07-02 DIAGNOSIS — I10 Essential (primary) hypertension: Secondary | ICD-10-CM | POA: Diagnosis present

## 2023-07-02 DIAGNOSIS — Z765 Malingerer [conscious simulation]: Secondary | ICD-10-CM | POA: Diagnosis not present

## 2023-07-02 DIAGNOSIS — G4700 Insomnia, unspecified: Secondary | ICD-10-CM | POA: Diagnosis present

## 2023-07-02 DIAGNOSIS — F601 Schizoid personality disorder: Secondary | ICD-10-CM | POA: Diagnosis present

## 2023-07-02 DIAGNOSIS — F1994 Other psychoactive substance use, unspecified with psychoactive substance-induced mood disorder: Secondary | ICD-10-CM | POA: Diagnosis not present

## 2023-07-02 DIAGNOSIS — I700 Atherosclerosis of aorta: Secondary | ICD-10-CM | POA: Diagnosis present

## 2023-07-02 LAB — HEPATITIS PANEL, ACUTE
HCV Ab: REACTIVE — AB
Hep A IgM: NONREACTIVE
Hep B C IgM: NONREACTIVE
Hepatitis B Surface Ag: NONREACTIVE

## 2023-07-02 LAB — BRAIN NATRIURETIC PEPTIDE: B Natriuretic Peptide: 87.8 pg/mL (ref 0.0–100.0)

## 2023-07-02 LAB — CBC
HCT: 38.3 % — ABNORMAL LOW (ref 39.0–52.0)
Hemoglobin: 12.2 g/dL — ABNORMAL LOW (ref 13.0–17.0)
MCH: 24.8 pg — ABNORMAL LOW (ref 26.0–34.0)
MCHC: 31.9 g/dL (ref 30.0–36.0)
MCV: 78 fL — ABNORMAL LOW (ref 80.0–100.0)
Platelets: 119 10*3/uL — ABNORMAL LOW (ref 150–400)
RBC: 4.91 MIL/uL (ref 4.22–5.81)
RDW: 19.9 % — ABNORMAL HIGH (ref 11.5–15.5)
WBC: 10.3 10*3/uL (ref 4.0–10.5)
nRBC: 0 % (ref 0.0–0.2)

## 2023-07-02 LAB — COMPREHENSIVE METABOLIC PANEL
ALT: 23 U/L (ref 0–44)
AST: 33 U/L (ref 15–41)
Albumin: 3 g/dL — ABNORMAL LOW (ref 3.5–5.0)
Alkaline Phosphatase: 77 U/L (ref 38–126)
Anion gap: 7 (ref 5–15)
BUN: 11 mg/dL (ref 8–23)
CO2: 28 mmol/L (ref 22–32)
Calcium: 8.4 mg/dL — ABNORMAL LOW (ref 8.9–10.3)
Chloride: 99 mmol/L (ref 98–111)
Creatinine, Ser: 0.93 mg/dL (ref 0.61–1.24)
GFR, Estimated: >60 mL/min (ref >60)
Glucose, Bld: 97 mg/dL (ref 70–99)
Potassium: 3.3 mmol/L — ABNORMAL LOW (ref 3.5–5.1)
Sodium: 134 mmol/L — ABNORMAL LOW (ref 135–145)
Total Bilirubin: 1.4 mg/dL — ABNORMAL HIGH (ref 0.3–1.2)
Total Protein: 8 g/dL (ref 6.5–8.1)

## 2023-07-02 LAB — STREP PNEUMONIAE URINARY ANTIGEN: Strep Pneumo Urinary Antigen: NEGATIVE

## 2023-07-02 LAB — RETICULOCYTES
Immature Retic Fract: 15.2 % (ref 2.3–15.9)
RBC.: 4.88 MIL/uL (ref 4.22–5.81)
Retic Count, Absolute: 48.3 10*3/uL (ref 19.0–186.0)
Retic Ct Pct: 1 % (ref 0.4–3.1)

## 2023-07-02 LAB — IRON AND TIBC
Iron: 25 ug/dL — ABNORMAL LOW (ref 45–182)
Saturation Ratios: 7 % — ABNORMAL LOW (ref 17.9–39.5)
TIBC: 347 ug/dL (ref 250–450)
UIBC: 322 ug/dL

## 2023-07-02 LAB — PHOSPHORUS: Phosphorus: 3.3 mg/dL (ref 2.5–4.6)

## 2023-07-02 LAB — LACTIC ACID, PLASMA
Lactic Acid, Venous: 1 mmol/L (ref 0.5–1.9)
Lactic Acid, Venous: 0.9 mmol/L (ref 0.5–1.9)

## 2023-07-02 LAB — FOLATE: Folate: 28.9 ng/mL (ref >5.9)

## 2023-07-02 LAB — APTT: aPTT: 30 s (ref 24–36)

## 2023-07-02 LAB — PROTIME-INR
INR: 1.2 (ref 0.8–1.2)
Prothrombin Time: 15.8 s — ABNORMAL HIGH (ref 11.4–15.2)

## 2023-07-02 LAB — FERRITIN: Ferritin: 92 ng/mL (ref 24–336)

## 2023-07-02 LAB — MAGNESIUM: Magnesium: 1.6 mg/dL — ABNORMAL LOW (ref 1.7–2.4)

## 2023-07-02 LAB — VITAMIN B12: Vitamin B-12: 520 pg/mL (ref 180–914)

## 2023-07-02 MED ORDER — SODIUM CHLORIDE 0.9% FLUSH: 3.0000 mL | INTRAVENOUS | Status: DC | PRN

## 2023-07-02 MED ORDER — HYDRALAZINE HCL 20 MG/ML IJ SOLN
10.0000 mg | Freq: Four times a day (QID) | INTRAMUSCULAR | Status: DC | PRN
  Administered 2023-07-02: 10 mg via INTRAVENOUS
  Filled 2023-07-02 (×2): qty 1

## 2023-07-02 MED ORDER — ACETAMINOPHEN 650 MG RE SUPP: 650.0000 mg | Freq: Four times a day (QID) | RECTAL | Status: DC | PRN

## 2023-07-02 MED ORDER — CHLORPROMAZINE HCL 10 MG PO TABS
10.0000 mg | ORAL_TABLET | Freq: Three times a day (TID) | ORAL | Status: DC | PRN
  Administered 2023-07-03 – 2023-07-04 (×2): 10 mg via ORAL
  Filled 2023-07-02 (×4): qty 1

## 2023-07-02 MED ORDER — TAMSULOSIN HCL 0.4 MG PO CAPS
0.4000 mg | ORAL_CAPSULE | Freq: Every day | ORAL | Status: DC
  Administered 2023-07-02 – 2023-07-05 (×4): 0.4 mg via ORAL
  Filled 2023-07-02 (×4): qty 1

## 2023-07-02 MED ORDER — LORAZEPAM 2 MG/ML IJ SOLN
1.0000 mg | INTRAMUSCULAR | Status: AC | PRN
  Administered 2023-07-02 – 2023-07-03 (×6): 2 mg via INTRAVENOUS
  Filled 2023-07-02 (×6): qty 1

## 2023-07-02 MED ORDER — ADULT MULTIVITAMIN W/MINERALS CH
1.0000 | ORAL_TABLET | Freq: Every day | ORAL | Status: DC
  Administered 2023-07-02 – 2023-07-05 (×4): 1 via ORAL
  Filled 2023-07-02 (×4): qty 1

## 2023-07-02 MED ORDER — MIRTAZAPINE 15 MG PO TABS
15.0000 mg | ORAL_TABLET | Freq: Every day | ORAL | Status: DC
  Administered 2023-07-02 – 2023-07-04 (×3): 15 mg via ORAL
  Filled 2023-07-02 (×3): qty 1

## 2023-07-02 MED ORDER — ENOXAPARIN SODIUM 40 MG/0.4ML IJ SOSY: 40.0000 mg | PREFILLED_SYRINGE | INTRAMUSCULAR | Status: DC

## 2023-07-02 MED ORDER — MAGNESIUM SULFATE 2 GM/50ML IV SOLN
2.0000 g | Freq: Once | INTRAVENOUS | Status: AC
  Administered 2023-07-02: 2 g via INTRAVENOUS
  Filled 2023-07-02: qty 50

## 2023-07-02 MED ORDER — SODIUM CHLORIDE 0.9 % IV SOLN: 250.0000 mL | INTRAVENOUS | Status: DC | PRN

## 2023-07-02 MED ORDER — THIAMINE MONONITRATE 100 MG PO TABS
100.0000 mg | ORAL_TABLET | Freq: Every day | ORAL | Status: DC
  Administered 2023-07-02 – 2023-07-05 (×4): 100 mg via ORAL
  Filled 2023-07-02 (×4): qty 1

## 2023-07-02 MED ORDER — PNEUMOCOCCAL 20-VAL CONJ VACC 0.5 ML IM SUSY: 0.5000 mL | PREFILLED_SYRINGE | INTRAMUSCULAR | Status: DC

## 2023-07-02 MED ORDER — SODIUM CHLORIDE 0.9 % IV SOLN
500.0000 mg | INTRAVENOUS | Status: DC
  Administered 2023-07-02: 500 mg via INTRAVENOUS
  Filled 2023-07-02: qty 5

## 2023-07-02 MED ORDER — DOXEPIN HCL 25 MG PO CAPS
150.0000 mg | ORAL_CAPSULE | Freq: Every day | ORAL | Status: DC
  Administered 2023-07-02 – 2023-07-04 (×3): 150 mg via ORAL
  Filled 2023-07-02: qty 6
  Filled 2023-07-02: qty 2
  Filled 2023-07-02 (×4): qty 6

## 2023-07-02 MED ORDER — TAMSULOSIN HCL 0.4 MG PO CAPS: 0.4000 mg | ORAL_CAPSULE | Freq: Every day | ORAL | Status: DC

## 2023-07-02 MED ORDER — CHLORPROMAZINE HCL 25 MG PO TABS: 25.0000 mg | ORAL_TABLET | Freq: Two times a day (BID) | ORAL | Status: DC

## 2023-07-02 MED ORDER — GUAIFENESIN ER 600 MG PO TB12
600.0000 mg | ORAL_TABLET | Freq: Two times a day (BID) | ORAL | Status: DC
  Administered 2023-07-02 – 2023-07-05 (×7): 600 mg via ORAL
  Filled 2023-07-02 (×8): qty 1

## 2023-07-02 MED ORDER — SODIUM CHLORIDE 0.9 % IV SOLN
1.0000 g | INTRAVENOUS | Status: DC
  Administered 2023-07-02: 1 g via INTRAVENOUS
  Filled 2023-07-02: qty 10

## 2023-07-02 MED ORDER — HEPARIN SODIUM (PORCINE) 5000 UNIT/ML IJ SOLN
5000.0000 [IU] | Freq: Three times a day (TID) | INTRAMUSCULAR | Status: DC
  Administered 2023-07-02 – 2023-07-05 (×11): 5000 [IU] via SUBCUTANEOUS
  Filled 2023-07-02 (×11): qty 1

## 2023-07-02 MED ORDER — LORAZEPAM 1 MG PO TABS
1.0000 mg | ORAL_TABLET | ORAL | Status: AC | PRN
  Administered 2023-07-02: 3 mg via ORAL
  Administered 2023-07-02 (×2): 2 mg via ORAL
  Filled 2023-07-02: qty 1
  Filled 2023-07-02 (×3): qty 2

## 2023-07-02 MED ORDER — AMLODIPINE BESYLATE 10 MG PO TABS
10.0000 mg | ORAL_TABLET | Freq: Every day | ORAL | Status: DC
  Administered 2023-07-02 – 2023-07-05 (×4): 10 mg via ORAL
  Filled 2023-07-02 (×4): qty 1

## 2023-07-02 MED ORDER — INFLUENZA VAC A&B SURF ANT ADJ 0.5 ML IM SUSY
0.5000 mL | PREFILLED_SYRINGE | INTRAMUSCULAR | Status: AC
  Administered 2023-07-03: 0.5 mL via INTRAMUSCULAR
  Filled 2023-07-02: qty 0.5

## 2023-07-02 MED ORDER — FERROUS SULFATE 325 (65 FE) MG PO TABS
325.0000 mg | ORAL_TABLET | Freq: Every day | ORAL | Status: DC
  Administered 2023-07-02 – 2023-07-05 (×4): 325 mg via ORAL
  Filled 2023-07-02 (×4): qty 1

## 2023-07-02 MED ORDER — SENNOSIDES-DOCUSATE SODIUM 8.6-50 MG PO TABS: 1.0000 | ORAL_TABLET | Freq: Every evening | ORAL | Status: DC | PRN

## 2023-07-02 MED ORDER — ONDANSETRON HCL 4 MG/2ML IJ SOLN: 4.0000 mg | Freq: Four times a day (QID) | INTRAMUSCULAR | Status: DC | PRN

## 2023-07-02 MED ORDER — PANTOPRAZOLE SODIUM 40 MG PO TBEC
40.0000 mg | DELAYED_RELEASE_TABLET | Freq: Every day | ORAL | Status: DC
  Administered 2023-07-02 – 2023-07-05 (×4): 40 mg via ORAL
  Filled 2023-07-02 (×4): qty 1

## 2023-07-02 MED ORDER — SODIUM CHLORIDE 0.9 % IV SOLN: 500.0000 mg | INTRAVENOUS | Status: DC

## 2023-07-02 MED ORDER — POTASSIUM CHLORIDE CRYS ER 20 MEQ PO TBCR
40.0000 meq | EXTENDED_RELEASE_TABLET | ORAL | Status: AC
  Administered 2023-07-02 (×2): 40 meq via ORAL
  Filled 2023-07-02 (×2): qty 2

## 2023-07-02 MED ORDER — ACETAMINOPHEN 500 MG PO TABS
1000.0000 mg | ORAL_TABLET | Freq: Once | ORAL | Status: AC
  Administered 2023-07-02: 1000 mg via ORAL
  Filled 2023-07-02: qty 2

## 2023-07-02 MED ORDER — ENSURE MAX PROTEIN PO LIQD
11.0000 [oz_av] | Freq: Three times a day (TID) | ORAL | Status: DC
  Administered 2023-07-02 – 2023-07-05 (×11): 11 [oz_av] via ORAL
  Filled 2023-07-02 (×12): qty 330

## 2023-07-02 MED ORDER — IBUPROFEN 200 MG PO TABS
800.0000 mg | ORAL_TABLET | Freq: Once | ORAL | Status: AC
  Administered 2023-07-02: 800 mg via ORAL
  Filled 2023-07-02: qty 4

## 2023-07-02 MED ORDER — FOLIC ACID 1 MG PO TABS
1.0000 mg | ORAL_TABLET | Freq: Every day | ORAL | Status: DC
  Administered 2023-07-02 – 2023-07-05 (×4): 1 mg via ORAL
  Filled 2023-07-02 (×4): qty 1

## 2023-07-02 MED ORDER — HYDRALAZINE HCL 20 MG/ML IJ SOLN: 5.0000 mg | Freq: Four times a day (QID) | INTRAMUSCULAR | Status: DC | PRN

## 2023-07-02 MED ORDER — SODIUM CHLORIDE 0.9% FLUSH: 3.0000 mL | Freq: Two times a day (BID) | INTRAVENOUS | Status: DC

## 2023-07-02 MED ORDER — ACETAMINOPHEN 325 MG PO TABS
650.0000 mg | ORAL_TABLET | Freq: Four times a day (QID) | ORAL | Status: DC | PRN
  Administered 2023-07-02 – 2023-07-03 (×4): 650 mg via ORAL
  Filled 2023-07-02 (×4): qty 2

## 2023-07-02 MED ORDER — THIAMINE HCL 100 MG/ML IJ SOLN
100.0000 mg | Freq: Every day | INTRAMUSCULAR | Status: DC
  Filled 2023-07-02: qty 2

## 2023-07-02 MED ORDER — LEVALBUTEROL HCL 0.63 MG/3ML IN NEBU: 0.6300 mg | INHALATION_SOLUTION | Freq: Four times a day (QID) | RESPIRATORY_TRACT | Status: DC | PRN

## 2023-07-02 MED ORDER — SODIUM CHLORIDE 0.9% FLUSH
3.0000 mL | Freq: Two times a day (BID) | INTRAVENOUS | Status: DC
  Administered 2023-07-02 – 2023-07-05 (×7): 3 mL via INTRAVENOUS

## 2023-07-02 MED ORDER — ONDANSETRON HCL 4 MG PO TABS
4.0000 mg | ORAL_TABLET | Freq: Four times a day (QID) | ORAL | Status: DC | PRN
  Administered 2023-07-02: 4 mg via ORAL
  Filled 2023-07-02: qty 1

## 2023-07-02 MED ORDER — LACTATED RINGERS IV SOLN: INTRAVENOUS | Status: AC
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
## 2023-07-02 NOTE — Plan of Care (Signed)
Problem: Clinical Measurements: Goal: Respiratory complications will improve Outcome: Progressing Goal: Cardiovascular complication will be avoided Outcome: Progressing   Problem: Nutrition: Goal: Adequate nutrition will be maintained Outcome: Progressing   Problem: Elimination: Goal: Will not experience complications related to bowel motility Outcome: Progressing Goal: Will not experience complications related to urinary retention Outcome: Progressing   Problem: Pain Managment: Goal: General experience of comfort will improve Outcome: Progressing   Problem: Safety: Goal: Ability to remain free from injury will improve Outcome: Progressing   Problem: Skin Integrity: Goal: Risk for impaired skin integrity will decrease Outcome: Progressing
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
## 2023-07-02 NOTE — Progress Notes (Signed)
Notified Dr. Jacqulyn Bath of pt's severe hiccups but will try Zofran at this time. MD reported to notify if unrelieved.
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
## 2023-07-02 NOTE — Plan of Care (Signed)

## 2023-07-02 NOTE — Progress Notes (Signed)
Notified Dr. Jacqulyn Bath of pt's fever and too soon to give APAP. Also that pt's hiccups are continuing. New orders received.

## 2023-07-02 NOTE — ED Notes (Signed)
[place] at bedside at this time

## 2023-07-03 ENCOUNTER — Inpatient Hospital Stay (HOSPITAL_COMMUNITY): Payer: Medicare HMO

## 2023-07-03 DIAGNOSIS — F10930 Alcohol use, unspecified with withdrawal, uncomplicated: Secondary | ICD-10-CM | POA: Diagnosis not present

## 2023-07-03 DIAGNOSIS — J189 Pneumonia, unspecified organism: Secondary | ICD-10-CM | POA: Diagnosis not present

## 2023-07-03 LAB — BLOOD GAS, ARTERIAL
Acid-Base Excess: 4.1 mmol/L — ABNORMAL HIGH (ref 0.0–2.0)
Bicarbonate: 27.4 mmol/L (ref 20.0–28.0)
O2 Saturation: 99.3 %
Patient temperature: 37.2
pCO2 arterial: 36 mmHg (ref 32–48)
pH, Arterial: 7.49 — ABNORMAL HIGH (ref 7.35–7.45)
pO2, Arterial: 88 mmHg (ref 83–108)

## 2023-07-03 LAB — RESPIRATORY PANEL BY PCR

## 2023-07-03 LAB — URINE CULTURE: Culture: >=100000 — AB

## 2023-07-03 LAB — CBC
HCT: 37.7 % — ABNORMAL LOW (ref 39.0–52.0)
Hemoglobin: 12.3 g/dL — ABNORMAL LOW (ref 13.0–17.0)
MCH: 25.5 pg — ABNORMAL LOW (ref 26.0–34.0)
MCHC: 32.6 g/dL (ref 30.0–36.0)
MCV: 78.2 fL — ABNORMAL LOW (ref 80.0–100.0)
Platelets: 104 10*3/uL — ABNORMAL LOW (ref 150–400)
RBC: 4.82 MIL/uL (ref 4.22–5.81)
RDW: 20.3 % — ABNORMAL HIGH (ref 11.5–15.5)
WBC: 8.5 10*3/uL (ref 4.0–10.5)
nRBC: 0 % (ref 0.0–0.2)

## 2023-07-03 LAB — COMPREHENSIVE METABOLIC PANEL
ALT: 46 U/L — ABNORMAL HIGH (ref 0–44)
AST: 70 U/L — ABNORMAL HIGH (ref 15–41)
Albumin: 2.9 g/dL — ABNORMAL LOW (ref 3.5–5.0)
Alkaline Phosphatase: 75 U/L (ref 38–126)
Anion gap: 11 (ref 5–15)
BUN: 16 mg/dL (ref 8–23)
CO2: 23 mmol/L (ref 22–32)
Calcium: 8.8 mg/dL — ABNORMAL LOW (ref 8.9–10.3)
Chloride: 104 mmol/L (ref 98–111)
Creatinine, Ser: 0.91 mg/dL (ref 0.61–1.24)
GFR, Estimated: >60 mL/min (ref >60)
Glucose, Bld: 124 mg/dL — ABNORMAL HIGH (ref 70–99)
Potassium: 4.3 mmol/L (ref 3.5–5.1)
Sodium: 138 mmol/L (ref 135–145)
Total Bilirubin: 0.7 mg/dL (ref 0.3–1.2)
Total Protein: 8.1 g/dL (ref 6.5–8.1)

## 2023-07-03 LAB — PROCALCITONIN: Procalcitonin: 0.13 ng/mL

## 2023-07-03 LAB — HEMOGLOBIN A1C
Hgb A1c MFr Bld: 5.5 % (ref 4.8–5.6)
Mean Plasma Glucose: 111 mg/dL

## 2023-07-03 LAB — MAGNESIUM: Magnesium: 1.8 mg/dL (ref 1.7–2.4)

## 2023-07-03 MED ORDER — IOHEXOL 350 MG/ML SOLN
75.0000 mL | Freq: Once | INTRAVENOUS | Status: AC | PRN
  Administered 2023-07-03: 75 mL via INTRAVENOUS

## 2023-07-03 MED ORDER — BENZTROPINE MESYLATE 1 MG PO TABS: 1.0000 mg | ORAL_TABLET | Freq: Four times a day (QID) | ORAL | Status: DC | PRN

## 2023-07-03 MED ORDER — SODIUM CHLORIDE 0.9 % IV SOLN
2.0000 g | INTRAVENOUS | Status: DC
  Administered 2023-07-03 – 2023-07-04 (×2): 2 g via INTRAVENOUS
  Filled 2023-07-03 (×2): qty 20

## 2023-07-03 MED ORDER — AZITHROMYCIN 500 MG PO TABS
500.0000 mg | ORAL_TABLET | Freq: Every day | ORAL | Status: AC
  Administered 2023-07-03 – 2023-07-05 (×3): 500 mg via ORAL
  Filled 2023-07-03 (×3): qty 1

## 2023-07-03 MED ORDER — HALOPERIDOL 1 MG PO TABS: 5.0000 mg | ORAL_TABLET | Freq: Four times a day (QID) | ORAL | Status: DC | PRN

## 2023-07-03 NOTE — Plan of Care (Signed)

## 2023-07-03 NOTE — Consult Note (Signed)
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       pantoprazole (PROTONIX) EC tablet 40 mg  40 mg Oral Daily Sundil, Subrina, MD   40 mg at 07/03/23 1610   protein supplement (ENSURE MAX) liquid  11 oz Oral TID BM Sundil, Subrina, MD   11 oz at 07/03/23 1329   senna-docusate (Senokot-S) tablet 1 tablet  1 tablet Oral QHS PRN Janalyn Shy, Subrina, MD       sodium chloride flush (NS) 0.9 % injection 3 mL  3 mL Intravenous Q12H Sundil,  Subrina, MD   3 mL at 07/03/23 0829   sodium chloride flush (NS) 0.9 % injection 3 mL  3 mL Intravenous PRN Janalyn Shy, Subrina, MD       tamsulosin [facility]) capsule 0.4 mg  0.4 mg Oral Daily Sundil, Subrina, MD   0.4 mg at 07/03/23 9604   thiamine (VITAMIN B1) tablet 100 mg  100 mg Oral Daily Sundil, Subrina, MD   100 mg at 07/03/23 0825      Psychiatric Specialty Exam:  Presentation  General Appearance:  Disheveled  Eye Contact: Minimal  Speech: Pressured  Speech Volume: Normal  Handedness: Right   Mood and Affect  Mood: Anxious  Affect: Full Range; Labile; Blunt   Thought Process  Thought Processes: Coherent; Linear  Descriptions of Associations:Tangential  Orientation:Full (Time, Place and Person)  Thought Content:Tangential; Logical  History of Schizophrenia/Schizoaffective disorder:Yes  Duration of Psychotic Symptoms:Greater than six months  Hallucinations:Hallucinations: None  Ideas of Reference:None  Suicidal Thoughts:Suicidal Thoughts: No  Homicidal Thoughts:Homicidal Thoughts: No   Sensorium  Memory: Immediate Poor; Recent Poor; Remote Poor  Judgment: Impaired  Insight: Lacking   Executive Functions  Concentration: Poor  Attention Span: Poor  Recall: Poor  Fund of Knowledge: Fair  Language: Fair   Psychomotor Activity  Psychomotor Activity: Psychomotor Activity: Restlessness; Increased   Assets  Assets: Communication Skills; Desire for Improvement   Sleep  Sleep: Sleep: Poor   Physical Exam: Physical Exam Vitals and nursing note reviewed.  Constitutional:      General: He is awake.     Appearance: He is obese.  Psychiatric:        Behavior: Behavior is uncooperative.    Review of Systems  Psychiatric/Behavioral:  Positive for substance abuse (ETOH). Negative for depression, hallucinations, memory loss and suicidal ideas. The patient is nervous/anxious and has insomnia.   All other systems reviewed  and are negative.  Blood pressure 133/65, pulse (!) 103, temperature 99 F (37.2 C), temperature source Oral, resp. rate 18, height 5\' 9"  (1.753 m), weight 77.1 kg, SpO2 94%. Body mass index is 25.1 kg/m.  Treatment Plan Summary: Daily contact with patient to assess and evaluate symptoms and progress in treatment -Continue Remeron 15  mg PO at bedtime  -Consider social work consult for long term rehab placement  -Continue CIWA/Ativan Detox protocol. Patient has required higher doses of ativan and phenobarbital on his previous admission.  -Recommend continuing safety sitter, very high risk for developing DTs.  -May require prn Haldol for agitation and aggression(hx of). Will add agitation protocol.    Labs reviewed include: Albumin (2.9), AST (70), ALT (46), Calcium (8.8), Reactive HCV, EKG QTc (411) corrected feder[employer] model.   Disposition: No evidence of imminent risk to self or others at present.   Patient does not meet criteria for psychiatric inpatient admission. -Psychiatry will continue to follow  Maryagnes Amos, FNP 07/03/2023 3:57 PM

## 2023-07-03 NOTE — Progress Notes (Signed)
PHARMACIST - PHYSICIAN COMMUNICATION DR:   Jacqulyn Bath CONCERNING: Antibiotic IV to Oral Route Change Policy  RECOMMENDATION: This patient is receiving azithromycin by the intravenous route.  Based on criteria approved by the Pharmacy and Therapeutics Committee, the antibiotic(s) is/are being converted to the equivalent oral dose form(s).   DESCRIPTION: These criteria include:  Patient being treated for a respiratory tract infection, urinary tract infection, cellulitis or clostridium difficile associated diarrhea if on metronidazole  The patient is not neutropenic and does not exhibit a GI malabsorption state  The patient is eating (either orally or via tube) and/or has been taking other orally administered medications for a least 24 hours  The patient is improving clinically and has a Tmax < 100.5  If you have questions about this conversion, please contact the Pharmacy Department  []   [phone] )  [facility] []   [phone] )  [facility] [x]   [phone] )  [name] []   [phone] )  [facility] []   [phone] )  [facility]

## 2023-07-03 NOTE — Plan of Care (Signed)
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
## 2023-07-04 ENCOUNTER — Inpatient Hospital Stay (HOSPITAL_COMMUNITY): Payer: Medicare HMO

## 2023-07-04 DIAGNOSIS — F10930 Alcohol use, unspecified with withdrawal, uncomplicated: Secondary | ICD-10-CM | POA: Diagnosis not present

## 2023-07-04 DIAGNOSIS — J189 Pneumonia, unspecified organism: Secondary | ICD-10-CM | POA: Diagnosis not present

## 2023-07-04 LAB — LEGIONELLA PNEUMOPHILA SEROGP 1 UR AG: L. pneumophila Serogp 1 Ur Ag: NEGATIVE

## 2023-07-04 LAB — COMPREHENSIVE METABOLIC PANEL
ALT: 35 U/L (ref 0–44)
AST: 42 U/L — ABNORMAL HIGH (ref 15–41)
Albumin: 2.7 g/dL — ABNORMAL LOW (ref 3.5–5.0)
Alkaline Phosphatase: 66 U/L (ref 38–126)
Anion gap: 8 (ref 5–15)
BUN: 12 mg/dL (ref 8–23)
CO2: 25 mmol/L (ref 22–32)
Calcium: 8.3 mg/dL — ABNORMAL LOW (ref 8.9–10.3)
Chloride: 102 mmol/L (ref 98–111)
Creatinine, Ser: 0.69 mg/dL (ref 0.61–1.24)
GFR, Estimated: >60 mL/min (ref >60)
Glucose, Bld: 102 mg/dL — ABNORMAL HIGH (ref 70–99)
Potassium: 4 mmol/L (ref 3.5–5.1)
Sodium: 135 mmol/L (ref 135–145)
Total Bilirubin: 0.5 mg/dL (ref 0.3–1.2)
Total Protein: 7.1 g/dL (ref 6.5–8.1)

## 2023-07-04 LAB — CBC
HCT: 36.5 % — ABNORMAL LOW (ref 39.0–52.0)
Hemoglobin: 11.8 g/dL — ABNORMAL LOW (ref 13.0–17.0)
MCH: 25.5 pg — ABNORMAL LOW (ref 26.0–34.0)
MCHC: 32.3 g/dL (ref 30.0–36.0)
MCV: 78.8 fL — ABNORMAL LOW (ref 80.0–100.0)
Platelets: 108 10*3/uL — ABNORMAL LOW (ref 150–400)
RBC: 4.63 MIL/uL (ref 4.22–5.81)
RDW: 20.2 % — ABNORMAL HIGH (ref 11.5–15.5)
WBC: 6.2 10*3/uL (ref 4.0–10.5)
nRBC: 0 % (ref 0.0–0.2)
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
## 2023-07-04 NOTE — Consult Note (Signed)
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 Linear  Descriptions of Associations:Tangential  Orientation:Full (Time, Place and Person)  Thought Content:Tangential; Logical  History of Schizophrenia/Schizoaffective disorder:Yes  Duration of Psychotic Symptoms:Greater than six months  Hallucinations:Hallucinations: None  Ideas of Reference:None  Suicidal Thoughts:Suicidal Thoughts: No  Homicidal Thoughts:Homicidal Thoughts: No   Sensorium   Memory: Immediate Poor; Recent Poor; Remote Poor  Judgment: Impaired  Insight: Lacking   Executive Functions  Concentration: Poor  Attention Span: Poor  Recall: Poor  Fund of Knowledge: Fair  Language: Fair   Psychomotor Activity  Psychomotor Activity: Psychomotor Activity: Restlessness; Increased   Assets  Assets: Communication Skills; Desire for Improvement   Sleep  Sleep: Sleep: Poor   Physical Exam: Physical Exam Vitals and nursing note reviewed.  Constitutional:      General: He is awake.     Appearance: He is obese.  Psychiatric:        Behavior: Behavior is uncooperative.    Review of Systems  Psychiatric/Behavioral:  Positive for substance abuse (ETOH). Negative for depression, hallucinations, memory loss and suicidal ideas. The patient is nervous/anxious and has insomnia.   All other systems reviewed and are negative.  Blood pressure 133/65, pulse (!) 103, temperature 99 F (37.2 C), temperature source Oral, resp. rate 18, height 5\' 9"  (1.753 m), weight 77.1 kg, SpO2 94%. Body mass index is 25.1 kg/m.  Treatment Plan Summary: Daily contact with patient to assess and evaluate symptoms and progress in treatment -Continue Remeron 15 mg PO at bedtime  -Consider social work consult for long term rehab placement  -Continue CIWA/Ativan Detox protocol. Patient has required higher doses of ativan and phenobarbital on his previous admission.  -Recommend continuing tele sitter, very high risk for developing DTs and unable to contract for safety.   -May require prn Haldol for agitation and aggression(hx of). Will add agitation protocol.   Labs reviewed include: Albumin (2.7), AST (42), ALT (35), Calcium (8.3), Reactive HCV, EKG QTc (411) corrected federica model.   Disposition: No evidence of imminent risk to self or others at present.   Patient does not meet criteria for psychiatric inpatient admission. -Psychiatry will continue to  follow  Maryagnes Amos, FNP 07/03/2023 3:57 PM

## 2023-07-04 NOTE — Plan of Care (Signed)

## 2023-07-04 NOTE — Plan of Care (Signed)

## 2023-07-04 NOTE — Plan of Care (Signed)

## 2023-07-05 ENCOUNTER — Other Ambulatory Visit (HOSPITAL_COMMUNITY): Payer: Self-pay

## 2023-07-05 ENCOUNTER — Ambulatory Visit (HOSPITAL_COMMUNITY)
Admission: EM | Admit: 2023-07-05 | Discharge: 2023-07-05 | Disposition: A | Discharge status: Home / Self Care | Payer: Medicare HMO | Attending: Nurse Practitioner | Admitting: Nurse Practitioner

## 2023-07-05 DIAGNOSIS — F1910 Other psychoactive substance abuse, uncomplicated: Secondary | ICD-10-CM | POA: Insufficient documentation

## 2023-07-05 DIAGNOSIS — F1994 Other psychoactive substance use, unspecified with psychoactive substance-induced mood disorder: Secondary | ICD-10-CM | POA: Insufficient documentation

## 2023-07-05 DIAGNOSIS — J189 Pneumonia, unspecified organism: Secondary | ICD-10-CM | POA: Diagnosis not present

## 2023-07-05 DIAGNOSIS — I10 Essential (primary) hypertension: Secondary | ICD-10-CM | POA: Diagnosis present

## 2023-07-05 DIAGNOSIS — B182 Chronic viral hepatitis C: Secondary | ICD-10-CM | POA: Insufficient documentation

## 2023-07-05 DIAGNOSIS — R4689 Other symptoms and signs involving appearance and behavior: Secondary | ICD-10-CM | POA: Diagnosis not present

## 2023-07-05 DIAGNOSIS — F32A Depression, unspecified: Secondary | ICD-10-CM | POA: Insufficient documentation

## 2023-07-05 DIAGNOSIS — Z5900 Homelessness unspecified: Secondary | ICD-10-CM | POA: Insufficient documentation

## 2023-07-05 DIAGNOSIS — Z765 Malingerer [conscious simulation]: Secondary | ICD-10-CM | POA: Insufficient documentation

## 2023-07-05 DIAGNOSIS — Z9151 Personal history of suicidal behavior: Secondary | ICD-10-CM | POA: Diagnosis present

## 2023-07-05 DIAGNOSIS — F411 Generalized anxiety disorder: Secondary | ICD-10-CM | POA: Insufficient documentation

## 2023-07-05 MED ORDER — AMOXICILLIN-POT CLAVULANATE 875-125 MG PO TABS
1.0000 | ORAL_TABLET | Freq: Two times a day (BID) | ORAL | 0 refills | Status: DC
  Filled 2023-07-05: qty 10, 5d supply, fill #0

## 2023-07-05 MED ORDER — CEFDINIR 300 MG PO CAPS
300.0000 mg | ORAL_CAPSULE | Freq: Two times a day (BID) | ORAL | 0 refills | Status: AC
  Filled 2023-07-05: qty 10, 5d supply, fill #0

## 2023-07-05 MED ORDER — MIRTAZAPINE 7.5 MG PO TABS
15.0000 mg | ORAL_TABLET | Freq: Every day | ORAL | 0 refills | Status: DC
Start: 2023-07-05 — End: 2023-10-16
  Filled 2023-07-05: qty 60, 30d supply, fill #0
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
## 2023-07-05 NOTE — TOC Transition Note (Signed)
Transition of Care [facility]) - CM/SW Discharge Note   Patient Details  Name: [name] MRN: [id number] Date of Birth: 1956-07-04  [facility] CM/SW Contact:  Lawerance Sabal, RN Phone Number: 07/05/2023, 1:27 PM   Clinical Narrative:     Medications will be filed through [facility] pharmacy through his insurance. Patient has [employer] and Medicaid, unfortunately there is not any further assistance available for medication coverage.         Patient Goals and CMS Choice      Discharge Placement                         Discharge Plan and Services Additional resources added to the After Visit Summary for                                       Social Determinants of Health (SDOH) Interventions SDOH Screenings   Food Insecurity: No Food Insecurity (07/02/2023)  Recent Concern: Food Insecurity - Food Insecurity Present (07/01/2023)  Housing: High Risk (07/02/2023)  Transportation Needs: Unmet Transportation Needs (07/02/2023)  Utilities: At Risk (07/02/2023)  Alcohol Screen: Medium Risk (05/29/2022)  Depression (PHQ2-9): Medium Risk (07/01/2023)  Tobacco Use: High Risk (07/02/2023)     Readmission Risk Interventions     No data to display

## 2023-07-05 NOTE — ED Notes (Addendum)
Pt left upset due to being d/c pt jumped towards the  NP and  was escorted off premises by security

## 2023-07-05 NOTE — TOC Transition Note (Signed)
Transition of Care [facility]) - CM/SW Discharge Note   Patient Details  Name: [name] MRN: [id number] Date of Birth: 1955-12-27  [facility]) CM/SW Contact:  Symon Norwood A [place], Theresia Majors Phone Number: 07/05/2023, 1:30 PM   Clinical Narrative:     CSW met with pt and informed him of hospital follow up scheduled and on pt's AVS. He said that he was aware but did not want to go.   He said he was waiting on his clothes then was leaving on the bus. He said he had funds and did not have any needs at discharge.   No other needs identified at this time. TOC will sign off, please consult again if TOC needs arise.    Final next level of care: Homeless Shelter Barriers to Discharge: No Barriers Identified   Patient Goals and CMS Choice      Discharge Placement                         Discharge Plan and Services Additional resources added to the After Visit Summary for                                       Social Determinants of Health (SDOH) Interventions SDOH Screenings   Food Insecurity: No Food Insecurity (07/02/2023)  Recent Concern: Food Insecurity - Food Insecurity Present (07/01/2023)  Housing: High Risk (07/02/2023)  Transportation Needs: Unmet Transportation Needs (07/02/2023)  Utilities: At Risk (07/02/2023)  Alcohol Screen: Medium Risk (05/29/2022)  Depression (PHQ2-9): Medium Risk (07/01/2023)  Tobacco Use: High Risk (07/02/2023)     Readmission Risk Interventions     No data to display

## 2023-07-05 NOTE — Care Management Important Message (Signed)
Important Message  Patient Details  Name: [name] MRN: [id number] Date of Birth: Oct 18, 1955   Medicare Important Message Given:  Yes     Drystan Reader Stefan Church 07/05/2023, 12:51 PM

## 2023-07-05 NOTE — Discharge Instructions (Signed)
  Discharge recommendations:  Patient is to take medications as prescribed. Please see information for follow-up appointment with psychiatry and therapy. Please follow up with your primary care provider for all medical related needs.   Therapy: We recommend that patient participate in individual therapy to address mental health concerns.  Medications: The patient or guardian is to contact a medical professional and/or outpatient provider to address any new side effects that develop. The patient or guardian should update outpatient providers of any new medications and/or medication changes.   Atypical antipsychotics: If you are prescribed an atypical antipsychotic, it is recommended that your height, weight, BMI, blood pressure, fasting lipid panel, and fasting blood sugar be monitored by your outpatient providers.  Safety:  The patient should abstain from use of illicit substances/drugs and abuse of any medications. If symptoms worsen or do not continue to improve or if the patient becomes actively suicidal or homicidal then it is recommended that the patient return to the closest hospital emergency department, the [facility], or call 911 for further evaluation and treatment. National Suicide Prevention Lifeline 1-800-SUICIDE or 1-800-273-8255.  About 988 988 offers 24/7 access to trained crisis counselors who can help people experiencing mental health-related distress. People can call or text 988 or chat 988lifeline.org for themselves or if they are worried about a loved one who may need crisis support.  Crisis Mobile: [facility]:                     1-877-626-1772 (for crisis response 24 hours a day) [facility] Hotline:                                            1-800-256-2452. 

## 2023-07-05 NOTE — Progress Notes (Signed)
   07/05/23 2139  [facility] Triage Screening (Walk-ins at [facility] only)  How Did You Hear About [place]? Self  What Is the Reason for Your Visit/Call Today? Pt is 67 yo male who presents to [facility] voluntarily due to seeking detox treatment. Pt reports that he was just discharged for the hospital earlier today after having pneumonia. Pt reports after he was discharged he began drinking again. Pt reports that around 3 am he had a pint of liquor and 3-4 40 oz beers. Pt reports hx of depression. Pt has hx of SI attempts but currently denies SI, HI, and AVH. Pt does not have any outpatient services. Pt reports that he would like to detox and then go to a long term rehab facility. Pt was calm and cooperative.  How Long Has This Been Causing You Problems? > than 6 months  Have You Recently Had Any Thoughts About Hurting Yourself? No  How long ago did you have thoughts about hurting yourself? Pt denies SI  Are You Planning to Commit Suicide/Harm Yourself At This time? No  Have you Recently Had Thoughts About Hurting Someone [name]? No  How long ago did you have thoughts of harming others? Pt denies HI  Are You Planning To Harm Someone At This Time? No  Explanation: Pt denies HI  Are you currently experiencing any auditory, visual or other hallucinations? No  Please explain the hallucinations you are currently experiencing: Pt denies AVH.  Have You Used Any Alcohol or Drugs in the Past 24 Hours? Yes  How long ago did you use Drugs or Alcohol? Pt reports he began drinking again after a 5 day hospital stay.  What Did You Use and How Much? A pint of liquor and 3-4 40 oz beers  Do you have any current medical co-morbidities that require immediate attention? No  Please describe current medical co-morbidities that require immediate attention: n/a  Clinician description of patient physical appearance/behavior: Pt is calm and cooperative.  What Do You Feel Would Help You the Most Today? Alcohol or Drug Use Treatment  If access  to [facility] was not available, would you have sought care in the Emergency Department? Yes  Determination of Need Urgent (48 hours)  Options For Referral Facility-Based Crisis;Outpatient Therapy    Flowsheet Row ED from 07/05/2023 in [facility] Most recent reading at 07/05/2023 10:10 PM ED to Hosp-Admission (Discharged) from 07/01/2023 in [place] [facility] Most recent reading at 07/02/2023  3:00 AM ED from 07/01/2023 in [facility] Most recent reading at 07/01/2023 11:51 AM  C-SSRS RISK CATEGORY High Risk No Risk Error: Q3, 4, or 5 should not be populated when Q2 is No
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
## 2023-07-05 NOTE — Discharge Summary (Signed)
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 "FERRITIN", "TIBC", "IRON", "RETICCTPCT" in the last 72 hours. Urinalysis    Component Value Date/Time   COLORURINE YELLOW 07/01/2023 1856   APPEARANCEUR CLOUDY (A) 07/01/2023 1856   LABSPEC 1.010 07/01/2023 1856   PHURINE 6.0 07/01/2023 1856   GLUCOSEU NEGATIVE 07/01/2023 1856   HGBUR SMALL (A)  07/01/2023 1856   HGBUR negative 05/12/2010 1022   BILIRUBINUR NEGATIVE 07/01/2023 1856   KETONESUR NEGATIVE 07/01/2023 1856   PROTEINUR 30 (A) 07/01/2023 1856   UROBILINOGEN 1.0 03/17/2012 1530   NITRITE NEGATIVE 07/01/2023 1856   LEUKOCYTESUR LARGE (A) 07/01/2023 1856   Sepsis Labs Recent Labs  Lab 07/02/23 0636 07/03/23 0804 07/04/23 1003 07/05/23 0714  WBC 10.3 8.5 6.2 4.9   Microbiology Recent Results (from the past 240 hour(s))  Urine Culture     Status:  Abnormal   Collection Time: 07/01/23  5:33 PM   Specimen: Urine, Random  Result Value Ref Range Status   Specimen Description URINE, RANDOM  Final   Special Requests   Final    NONE Reflexed from [id number] Performed at [facility], [address] [place], [place] [address]    Culture >=100,000 COLONIES/mL ESCHERICHIA COLI (A)  Final   Report Status 07/03/2023 FINAL  Final   Organism ID, Bacteria ESCHERICHIA COLI (A)  Final      Susceptibility   Escherichia coli - MIC*    AMPICILLIN >=32 RESISTANT Resistant     CEFAZOLIN <=4 SENSITIVE Sensitive     CEFEPIME <=0.12 SENSITIVE Sensitive     CEFTRIAXONE <=0.25 SENSITIVE Sensitive     CIPROFLOXACIN <=0.25 SENSITIVE Sensitive     GENTAMICIN <=1 SENSITIVE Sensitive     IMIPENEM <=0.25 SENSITIVE Sensitive     NITROFURANTOIN <=16 SENSITIVE Sensitive     TRIMETH/SULFA >=320 RESISTANT Resistant     AMPICILLIN/SULBACTAM 16 INTERMEDIATE Intermediate     PIP/TAZO <=4 SENSITIVE Sensitive     * >=100,000 COLONIES/mL ESCHERICHIA COLI  Respiratory (~20 pathogens) panel by PCR     Status: None   Collection Time: 07/01/23  9:35 PM   Specimen: Nasopharyngeal Swab; Respiratory  Result Value Ref Range Status   Adenovirus NOT DETECTED NOT DETECTED Final   Coronavirus 229E NOT DETECTED NOT DETECTED Final    Comment: (NOTE) The Coronavirus on the Respiratory Panel, DOES NOT test for the novel  Coronavirus (2019 nCoV)    Coronavirus HKU1 NOT DETECTED NOT DETECTED Final   Coronavirus NL63 NOT DETECTED NOT DETECTED Final   Coronavirus OC43 NOT DETECTED NOT DETECTED Final   Metapneumovirus NOT DETECTED NOT DETECTED Final   Rhinovirus / Enterovirus NOT DETECTED NOT DETECTED Final   Influenza A NOT DETECTED NOT DETECTED Final   Influenza B NOT DETECTED NOT DETECTED Final   Parainfluenza Virus 1 NOT DETECTED NOT DETECTED Final   Parainfluenza Virus 2 NOT DETECTED NOT DETECTED Final   Parainfluenza Virus 3 NOT DETECTED NOT DETECTED Final    Parainfluenza Virus 4 NOT DETECTED NOT DETECTED Final   Respiratory Syncytial Virus NOT DETECTED NOT DETECTED Final   Bordetella pertussis NOT DETECTED NOT DETECTED Final   Bordetella Parapertussis NOT DETECTED NOT DETECTED Final   Chlamydophila pneumoniae NOT DETECTED NOT DETECTED Final   Mycoplasma pneumoniae NOT DETECTED NOT DETECTED Final    Comment: Performed at [facility], [address] [place], [place] [address]  SARS Coronavirus 2 by RT PCR (hospital order, performed in [facility] hospital lab) *cepheid single result test* Anterior Nasal Swab     Status: None   Collection Time: 07/01/23  9:37 PM   Specimen: Anterior Nasal Swab  Result Value Ref Range Status   SARS Coronavirus 2 by RT PCR NEGATIVE NEGATIVE Final    Comment: Performed at [facility], [address], [place], [place] [address]  Culture, blood (routine x 2)  Status: None (Preliminary result)   Collection Time: 07/02/23 12:05 AM   Specimen: BLOOD LEFT HAND  Result Value Ref Range Status   Specimen Description BLOOD LEFT HAND  Final   Special Requests   Final    BOTTLES DRAWN AEROBIC AND ANAEROBIC Blood Culture adequate volume   Culture   Final    NO GROWTH 3 DAYS Performed at [facility], [address], [place], [place] [address]    Report Status PENDING  Incomplete  Culture, blood (routine x 2)     Status: None (Preliminary result)   Collection Time: 07/02/23 12:17 AM   Specimen: BLOOD RIGHT ARM  Result Value Ref Range Status   Specimen Description BLOOD RIGHT ARM  Final   Special Requests   Final    BOTTLES DRAWN AEROBIC AND ANAEROBIC Blood Culture results may not be optimal due to an excessive volume of blood received in culture bottles   Culture   Final    NO GROWTH 3 DAYS Performed at [facility], [address], [place], [place] [address]    Report Status PENDING  Incomplete    FURTHER DISCHARGE INSTRUCTIONS:   Get Medicines reviewed and adjusted: Please take all  your medications with you for your next visit with your Primary MD   Laboratory/radiological data: Please request your Primary MD to go over all hospital tests and procedure/radiological results at the follow up, please ask your Primary MD to get all Hospital records sent to his/her office.   In some cases, they will be blood work, cultures and biopsy results pending at the time of your discharge. Please request that your primary care M.D. goes through all the records of your hospital data and follows up on these results.   Also Note the following: If you experience worsening of your admission symptoms, develop shortness of breath, life threatening emergency, suicidal or homicidal thoughts you must seek medical attention immediately by calling 911 or calling your MD immediately  if symptoms less severe.   You must read complete instructions/literature along with all the possible adverse reactions/side effects for all the Medicines you take and that have been prescribed to you. Take any new Medicines after you have completely understood and accpet all the possible adverse reactions/side effects.    Do not drive when taking Pain medications or sleeping medications (Benzodaizepines)   Do not take more than prescribed Pain, Sleep and Anxiety Medications. It is not advisable to combine anxiety,sleep and pain medications without talking with your primary care practitioner   Special Instructions: If you have smoked or chewed Tobacco  in the last 2 yrs please stop smoking, stop any regular Alcohol  and or any Recreational drug use.   Wear Seat belts while driving.   Please note: You were cared for by a hospitalist during your hospital stay. Once you are discharged, your primary care physician will handle any further medical issues. Please note that NO REFILLS for any discharge medications will be authorized once you are discharged, as it is imperative that you return to your primary care physician (or  establish a relationship with a primary care physician if you do not have one) for your post hospital discharge needs so that they can reassess your need for medications and monitor your lab values  Time coordinating discharge: Over 30 minutes  SIGNED:   Hughie Closs, MD  [facility] 07/05/2023, 12:23 PM *Please note that this is a verbal dictation therefore any spelling or grammatical errors  are due to the "[employer] One" system interpretation. If 7PM-7AM, please contact night-coverage [email or web address]

## 2023-07-07 LAB — CULTURE, BLOOD (ROUTINE X 2)
Culture: NO GROWTH
Culture: NO GROWTH
Special Requests: ADEQUATE

## 2023-07-19 ENCOUNTER — Other Ambulatory Visit: Payer: Self-pay

## 2023-07-19 ENCOUNTER — Encounter (HOSPITAL_COMMUNITY): Payer: Self-pay | Admitting: Emergency Medicine

## 2023-07-19 ENCOUNTER — Emergency Department (HOSPITAL_COMMUNITY)
Admission: EM | Admit: 2023-07-19 | Discharge: 2023-07-20 | Disposition: A | Discharge status: Home / Self Care | Payer: Medicare HMO | Attending: Emergency Medicine | Admitting: Emergency Medicine

## 2023-07-19 DIAGNOSIS — K920 Hematemesis: Secondary | ICD-10-CM | POA: Insufficient documentation

## 2023-07-19 DIAGNOSIS — I10 Essential (primary) hypertension: Secondary | ICD-10-CM | POA: Diagnosis not present

## 2023-07-19 DIAGNOSIS — F1010 Alcohol abuse, uncomplicated: Secondary | ICD-10-CM | POA: Diagnosis not present

## 2023-07-19 DIAGNOSIS — Z79899 Other long term (current) drug therapy: Secondary | ICD-10-CM | POA: Diagnosis not present

## 2023-07-19 DIAGNOSIS — R112 Nausea with vomiting, unspecified: Secondary | ICD-10-CM | POA: Diagnosis present

## 2023-07-19 LAB — COMPREHENSIVE METABOLIC PANEL
ALT: 30 U/L (ref 0–44)
AST: 37 U/L (ref 15–41)
Albumin: 3.9 g/dL (ref 3.5–5.0)
Alkaline Phosphatase: 88 U/L (ref 38–126)
Anion gap: 11 (ref 5–15)
BUN: 7 mg/dL — ABNORMAL LOW (ref 8–23)
CO2: 25 mmol/L (ref 22–32)
Calcium: 8.9 mg/dL (ref 8.9–10.3)
Chloride: 99 mmol/L (ref 98–111)
Creatinine, Ser: 0.62 mg/dL (ref 0.61–1.24)
GFR, Estimated: >60 mL/min (ref >60)
Glucose, Bld: 98 mg/dL (ref 70–99)
Potassium: 3.4 mmol/L — ABNORMAL LOW (ref 3.5–5.1)
Sodium: 135 mmol/L (ref 135–145)
Total Bilirubin: 1.9 mg/dL — ABNORMAL HIGH (ref 0.3–1.2)
Total Protein: 8.7 g/dL — ABNORMAL HIGH (ref 6.5–8.1)

## 2023-07-19 LAB — CBC
HCT: 42.4 % (ref 39.0–52.0)
Hemoglobin: 13.7 g/dL (ref 13.0–17.0)
MCH: 26.1 pg (ref 26.0–34.0)
MCHC: 32.3 g/dL (ref 30.0–36.0)
MCV: 80.9 fL (ref 80.0–100.0)
Platelets: 156 10*3/uL (ref 150–400)
RBC: 5.24 MIL/uL (ref 4.22–5.81)
RDW: 21.8 % — ABNORMAL HIGH (ref 11.5–15.5)
WBC: 7.9 10*3/uL (ref 4.0–10.5)
nRBC: 0 % (ref 0.0–0.2)

## 2023-07-19 LAB — TYPE AND SCREEN
ABO/RH(D): O POS
Antibody Screen: NEGATIVE

## 2023-07-19 MED ORDER — ONDANSETRON HCL 4 MG PO TABS
4.0000 mg | ORAL_TABLET | Freq: Four times a day (QID) | ORAL | 0 refills | Status: DC
Start: 2023-07-19 — End: 2023-10-16

## 2023-07-19 MED ORDER — ONDANSETRON 4 MG PO TBDP
4.0000 mg | ORAL_TABLET | Freq: Once | ORAL | Status: AC
  Administered 2023-07-19: 4 mg via ORAL
  Filled 2023-07-19: qty 1

## 2023-07-19 NOTE — ED Triage Notes (Addendum)
Pt BIB EMS with c/o dark hematemesis since yesterday that started after drinking a 1/5 of vodka. Pt states that he has been drinking all day today, but cannot keep anything down.

## 2023-07-19 NOTE — ED Provider Notes (Incomplete)
  Physical Exam  BP (!) 177/95 (BP Location: Left Arm)   Pulse 99   Temp 98.3 F (36.8 C) (Oral)   Resp 17   Ht 5\' 9"  (1.753 m)   Wt 77 kg   SpO2 96%   BMI 25.07 kg/m   Physical Exam  Procedures  Procedures  ED Course / MDM   Clinical Course as of 07/19/23 2350  Wed Jul 19, 2023  2328 Patient reports hematemesis since yesterday with any PO intake. Labs tonight show a normal hemoglobin of 13.7. No observed emesis in ED. VSS, no hypotension or tachycardia. Patient appears hemodynamically stable and in NAD. Will provide Zofran and attempt PO challenge.  [SU]    Clinical Course User Index [SU] Elpidio Anis, PA-C   Medical Decision Making Amount and/or Complexity of Data Reviewed Labs: ordered.  Risk Prescription drug management.   Zofran and PO challenge. Hemoglobin stable. Doubt acute GI bleed and no need for admission at this time. Not actively vomiting.

## 2023-07-19 NOTE — ED Provider Notes (Signed)
[facility] AT [facility] Provider Note   CSN: [id number] Arrival date & time: 07/19/23  2129     History  Chief Complaint  Patient presents with   Hematemesis    [name] is a 67 y.o. male.  Patient with history of depression, GAD, alcohol use disorder, opioid use d/o, stimulant use d/o (cocaine) substance-induced mood disorder, chronic iron deficiency anemia,, chronic hep C, compensated cirrhosis with thrombocytopenia, HTN presents to ED reporting persistent vomiting since yesterday. He states his emesis is "pink" and "I can taste blood in my mouth". He states he vomits with any attempt at PO intake. No fever. He reports epigastric abdominal pain, "I think my pancreas is acting up."  The history is provided by the patient. No language interpreter was used.       Home Medications Prior to Admission medications   Medication Sig Start Date End Date Taking? Authorizing Provider  ondansetron (ZOFRAN) 4 MG tablet Take 1 tablet (4 mg total) by mouth every 6 (six) hours. 07/19/23  Yes Ebin Palazzi, Melvenia Beam, PA-C  amLODipine (NORVASC) 10 MG tablet Take 10 mg by mouth daily. 06/27/23   [provider]  chlorproMAZINE (THORAZINE) 25 MG tablet Take 25 mg by mouth 2 (two) times daily. 06/14/23   [provider]  doxepin (SINEQUAN) 150 MG capsule Take 150 mg by mouth at bedtime. 06/26/23   [provider]  gabapentin (NEURONTIN) 300 MG capsule Take 1 capsule (300 mg total) by mouth 3 (three) times daily. 04/03/23   Diamantina Monks, MD  hydrocortisone cream 1 % Apply 1 Application topically 2 (two) times daily. 05/14/23   [provider]  mirtazapine (REMERON) 7.5 MG tablet Take 2 tablets (15 mg total) by mouth at bedtime. 07/05/23 08/04/23  Hughie Closs, MD  Multiple Vitamin (MULTIVITAMIN WITH MINERALS) TABS tablet Take 1 tablet by mouth daily. 05/12/23   Karie Fetch, MD  omeprazole (PRILOSEC) 20 MG capsule Take 1 capsule (20 mg  total) by mouth daily. 05/12/23 07/01/23  Karie Fetch, MD  tamsulosin (FLOMAX) 0.4 MG CAPS capsule Take 0.4 mg by mouth daily. Patient not taking: Reported on 07/01/2023 06/27/23   [provider]      Allergies    Carrot [daucus carota] and Carrot [daucus carota]    Review of Systems   Review of Systems  Physical Exam Updated Vital Signs BP (!) 177/95 (BP Location: Left Arm)   Pulse 99   Temp 98.3 F (36.8 C) (Oral)   Resp 17   Ht 5\' 9"  (1.753 m)   Wt 77 kg   SpO2 96%   BMI 25.07 kg/m  Physical Exam Vitals and nursing note reviewed.  Constitutional:      General: He is not in acute distress.    Appearance: Normal appearance.  Eyes:     Conjunctiva/sclera: Conjunctivae normal.  Cardiovascular:     Rate and Rhythm: Normal rate and regular rhythm.     Heart sounds: No murmur heard. Pulmonary:     Effort: Pulmonary effort is normal.     Breath sounds: No wheezing, rhonchi or rales.  Abdominal:     General: There is no distension.     Palpations: Abdomen is soft.     Tenderness: There is no abdominal tenderness.     Comments: No ascites.  Musculoskeletal:        General: Normal range of motion.     Cervical back: Normal range of motion and neck supple.  Skin:  General: Skin is warm and dry.     Coloration: Skin is not pale.  Neurological:     Mental Status: He is alert and oriented to person, place, and time.     ED Results / Procedures / Treatments   Labs (all labs ordered are listed, but only abnormal results are displayed) Labs Reviewed  COMPREHENSIVE METABOLIC PANEL - Abnormal; Notable for the following components:      Result Value   Potassium 3.4 (*)    BUN 7 (*)    Total Protein 8.7 (*)    Total Bilirubin 1.9 (*)    All other components within normal limits  CBC - Abnormal; Notable for the following components:   RDW 21.8 (*)    All other components within normal limits  TYPE AND SCREEN    EKG None  Radiology No results  found.  Procedures Procedures    Medications Ordered in ED Medications  ondansetron (ZOFRAN-ODT) disintegrating tablet 4 mg (4 mg Oral Given 07/19/23 2351)    ED Course/ Medical Decision Making/ A&P Clinical Course as of 07/20/23 0002  Wed Jul 19, 2023  2328 Patient reports hematemesis since yesterday with any PO intake. Labs tonight show a normal hemoglobin of 13.7. No observed emesis in ED. VSS, no hypotension or tachycardia. Patient appears hemodynamically stable and in NAD. Will provide Zofran and attempt PO challenge.  [SU]  2356 Patient sleeping on recheck. He asks about rehab for alcohol dependence. Informed him that resources will be provided. No vomiting in ED. Feel he is stable and can be discharged with medication for nausea. Encouraged to follow up with outpatient resources.  [SU]    Clinical Course User Index [SU] Elpidio Anis, PA-C                                 Medical Decision Making Amount and/or Complexity of Data Reviewed Labs: ordered.  Risk Prescription drug management.           Final Clinical Impression(s) / ED Diagnoses Final diagnoses:  Alcohol abuse  Nausea and vomiting, unspecified vomiting type    Rx / DC Orders ED Discharge Orders          Ordered    ondansetron (ZOFRAN) 4 MG tablet  Every 6 hours        07/19/23 2359              Elpidio Anis, PA-C 07/20/23 0002    Melene Plan, DO 07/20/23 0003

## 2023-07-20 ENCOUNTER — Inpatient Hospital Stay (INDEPENDENT_AMBULATORY_CARE_PROVIDER_SITE_OTHER): Payer: Medicare HMO | Admitting: Primary Care

## 2023-07-20 NOTE — Discharge Instructions (Signed)
Your labs are stable and your iron level is normal. You can be discharged home. Use Zofran for any further nausea and vomiting.   You have been given resources for both outpatient and residential substance abuse treatment centers.

## 2023-10-05 ENCOUNTER — Other Ambulatory Visit: Payer: Self-pay

## 2023-10-05 ENCOUNTER — Inpatient Hospital Stay (HOSPITAL_COMMUNITY)
Admission: EM | Admit: 2023-10-05 | Discharge: 2023-10-16 | DRG: 291 | Disposition: A | Discharge status: Home / Self Care | Payer: Medicare HMO | Attending: Internal Medicine | Admitting: Internal Medicine

## 2023-10-05 ENCOUNTER — Encounter (HOSPITAL_COMMUNITY): Payer: Self-pay

## 2023-10-05 ENCOUNTER — Emergency Department (HOSPITAL_COMMUNITY): Payer: Medicare HMO

## 2023-10-05 DIAGNOSIS — F329 Major depressive disorder, single episode, unspecified: Secondary | ICD-10-CM | POA: Diagnosis present

## 2023-10-05 DIAGNOSIS — G629 Polyneuropathy, unspecified: Secondary | ICD-10-CM | POA: Diagnosis present

## 2023-10-05 DIAGNOSIS — D509 Iron deficiency anemia, unspecified: Secondary | ICD-10-CM | POA: Diagnosis present

## 2023-10-05 DIAGNOSIS — H5704 Mydriasis: Secondary | ICD-10-CM | POA: Diagnosis present

## 2023-10-05 DIAGNOSIS — E8809 Other disorders of plasma-protein metabolism, not elsewhere classified: Secondary | ICD-10-CM | POA: Diagnosis present

## 2023-10-05 DIAGNOSIS — K7030 Alcoholic cirrhosis of liver without ascites: Secondary | ICD-10-CM | POA: Diagnosis present

## 2023-10-05 DIAGNOSIS — J810 Acute pulmonary edema: Secondary | ICD-10-CM | POA: Diagnosis not present

## 2023-10-05 DIAGNOSIS — Z9152 Personal history of nonsuicidal self-harm: Secondary | ICD-10-CM

## 2023-10-05 DIAGNOSIS — R569 Unspecified convulsions: Secondary | ICD-10-CM | POA: Diagnosis not present

## 2023-10-05 DIAGNOSIS — I509 Heart failure, unspecified: Secondary | ICD-10-CM | POA: Diagnosis present

## 2023-10-05 DIAGNOSIS — Z5900 Homelessness unspecified: Secondary | ICD-10-CM

## 2023-10-05 DIAGNOSIS — K219 Gastro-esophageal reflux disease without esophagitis: Secondary | ICD-10-CM | POA: Diagnosis present

## 2023-10-05 DIAGNOSIS — I5082 Biventricular heart failure: Secondary | ICD-10-CM | POA: Diagnosis present

## 2023-10-05 DIAGNOSIS — R45851 Suicidal ideations: Secondary | ICD-10-CM | POA: Diagnosis present

## 2023-10-05 DIAGNOSIS — E785 Hyperlipidemia, unspecified: Secondary | ICD-10-CM | POA: Diagnosis present

## 2023-10-05 DIAGNOSIS — E872 Acidosis, unspecified: Secondary | ICD-10-CM | POA: Diagnosis present

## 2023-10-05 DIAGNOSIS — I5043 Acute on chronic combined systolic (congestive) and diastolic (congestive) heart failure: Secondary | ICD-10-CM | POA: Diagnosis present

## 2023-10-05 DIAGNOSIS — I110 Hypertensive heart disease with heart failure: Secondary | ICD-10-CM | POA: Diagnosis present

## 2023-10-05 DIAGNOSIS — E876 Hypokalemia: Secondary | ICD-10-CM | POA: Diagnosis present

## 2023-10-05 DIAGNOSIS — F10931 Alcohol use, unspecified with withdrawal delirium: Secondary | ICD-10-CM

## 2023-10-05 DIAGNOSIS — Z781 Physical restraint status: Secondary | ICD-10-CM

## 2023-10-05 DIAGNOSIS — R1310 Dysphagia, unspecified: Secondary | ICD-10-CM | POA: Diagnosis present

## 2023-10-05 DIAGNOSIS — E875 Hyperkalemia: Secondary | ICD-10-CM | POA: Diagnosis not present

## 2023-10-05 DIAGNOSIS — Z7901 Long term (current) use of anticoagulants: Secondary | ICD-10-CM

## 2023-10-05 DIAGNOSIS — F10231 Alcohol dependence with withdrawal delirium: Secondary | ICD-10-CM | POA: Diagnosis present

## 2023-10-05 DIAGNOSIS — Z1152 Encounter for screening for COVID-19: Secondary | ICD-10-CM

## 2023-10-05 DIAGNOSIS — Z9151 Personal history of suicidal behavior: Secondary | ICD-10-CM

## 2023-10-05 DIAGNOSIS — I4891 Unspecified atrial fibrillation: Secondary | ICD-10-CM | POA: Diagnosis present

## 2023-10-05 DIAGNOSIS — F1410 Cocaine abuse, uncomplicated: Secondary | ICD-10-CM | POA: Diagnosis present

## 2023-10-05 DIAGNOSIS — F10939 Alcohol use, unspecified with withdrawal, unspecified: Secondary | ICD-10-CM | POA: Diagnosis not present

## 2023-10-05 DIAGNOSIS — I5041 Acute combined systolic (congestive) and diastolic (congestive) heart failure: Secondary | ICD-10-CM | POA: Diagnosis not present

## 2023-10-05 DIAGNOSIS — I4819 Other persistent atrial fibrillation: Secondary | ICD-10-CM | POA: Diagnosis not present

## 2023-10-05 DIAGNOSIS — E871 Hypo-osmolality and hyponatremia: Secondary | ICD-10-CM | POA: Diagnosis present

## 2023-10-05 DIAGNOSIS — I5021 Acute systolic (congestive) heart failure: Secondary | ICD-10-CM | POA: Diagnosis not present

## 2023-10-05 DIAGNOSIS — F17210 Nicotine dependence, cigarettes, uncomplicated: Secondary | ICD-10-CM | POA: Diagnosis present

## 2023-10-05 DIAGNOSIS — K7460 Unspecified cirrhosis of liver: Secondary | ICD-10-CM | POA: Diagnosis not present

## 2023-10-05 DIAGNOSIS — Z79899 Other long term (current) drug therapy: Secondary | ICD-10-CM

## 2023-10-05 DIAGNOSIS — D649 Anemia, unspecified: Secondary | ICD-10-CM | POA: Diagnosis not present

## 2023-10-05 LAB — CBC WITH DIFFERENTIAL/PLATELET
Abs Immature Granulocytes: 0.01 10*3/uL (ref 0.00–0.07)
Basophils Absolute: 0 10*3/uL (ref 0.0–0.1)
Basophils Relative: 1 %
Eosinophils Absolute: 0 10*3/uL (ref 0.0–0.5)
Eosinophils Relative: 1 %
HCT: 38.5 % — ABNORMAL LOW (ref 39.0–52.0)
Hemoglobin: 11.8 g/dL — ABNORMAL LOW (ref 13.0–17.0)
Immature Granulocytes: 0 %
Lymphocytes Relative: 20 %
Lymphs Abs: 0.9 10*3/uL (ref 0.7–4.0)
MCH: 25.4 pg — ABNORMAL LOW (ref 26.0–34.0)
MCHC: 30.6 g/dL (ref 30.0–36.0)
MCV: 83 fL (ref 80.0–100.0)
Monocytes Absolute: 0.5 10*3/uL (ref 0.1–1.0)
Monocytes Relative: 11 %
Neutro Abs: 3.1 10*3/uL (ref 1.7–7.7)
Neutrophils Relative %: 67 %
Platelets: 141 10*3/uL — ABNORMAL LOW (ref 150–400)
RBC: 4.64 MIL/uL (ref 4.22–5.81)
RDW: 15.9 % — ABNORMAL HIGH (ref 11.5–15.5)
WBC: 4.6 10*3/uL (ref 4.0–10.5)
nRBC: 0 % (ref 0.0–0.2)

## 2023-10-05 LAB — RAPID URINE DRUG SCREEN, HOSP PERFORMED
Amphetamines: NOT DETECTED
Barbiturates: NOT DETECTED
Benzodiazepines: NOT DETECTED
Cocaine: NOT DETECTED
Opiates: NOT DETECTED
Tetrahydrocannabinol: NOT DETECTED

## 2023-10-05 LAB — COMPREHENSIVE METABOLIC PANEL
ALT: 32 U/L (ref 0–44)
AST: 51 U/L — ABNORMAL HIGH (ref 15–41)
Albumin: 3.7 g/dL (ref 3.5–5.0)
Alkaline Phosphatase: 83 U/L (ref 38–126)
Anion gap: 10 (ref 5–15)
BUN: 12 mg/dL (ref 8–23)
CO2: 23 mmol/L (ref 22–32)
Calcium: 8.7 mg/dL — ABNORMAL LOW (ref 8.9–10.3)
Chloride: 103 mmol/L (ref 98–111)
Creatinine, Ser: 0.86 mg/dL (ref 0.61–1.24)
GFR, Estimated: >60 mL/min (ref >60)
Glucose, Bld: 80 mg/dL (ref 70–99)
Potassium: 3.6 mmol/L (ref 3.5–5.1)
Sodium: 136 mmol/L (ref 135–145)
Total Bilirubin: 1.1 mg/dL (ref <1.2)
Total Protein: 8.3 g/dL — ABNORMAL HIGH (ref 6.5–8.1)

## 2023-10-05 LAB — RESP PANEL BY RT-PCR (RSV, FLU A&B, COVID)  RVPGX2
Influenza A by PCR: NEGATIVE
Influenza B by PCR: NEGATIVE
Resp Syncytial Virus by PCR: NEGATIVE
SARS Coronavirus 2 by RT PCR: NEGATIVE

## 2023-10-05 LAB — TROPONIN I (HIGH SENSITIVITY)
Troponin I (High Sensitivity): 7 ng/L (ref <18)
Troponin I (High Sensitivity): 8 ng/L (ref <18)

## 2023-10-05 LAB — MAGNESIUM: Magnesium: 2 mg/dL (ref 1.7–2.4)

## 2023-10-05 LAB — BRAIN NATRIURETIC PEPTIDE: B Natriuretic Peptide: 157.8 pg/mL — ABNORMAL HIGH (ref 0.0–100.0)

## 2023-10-05 MED ORDER — CAMPHOR-MENTHOL 0.5-0.5 % EX LOTN
TOPICAL_LOTION | CUTANEOUS | Status: DC | PRN
  Filled 2023-10-05: qty 222

## 2023-10-05 MED ORDER — FOLIC ACID 1 MG PO TABS
1.0000 mg | ORAL_TABLET | Freq: Every day | ORAL | Status: DC
  Administered 2023-10-05 – 2023-10-06 (×2): 1 mg via ORAL
  Filled 2023-10-05 (×2): qty 1

## 2023-10-05 MED ORDER — ORAL CARE MOUTH RINSE: 15.0000 mL | OROMUCOSAL | Status: DC | PRN

## 2023-10-05 MED ORDER — TRAZODONE HCL 50 MG PO TABS
25.0000 mg | ORAL_TABLET | Freq: Every evening | ORAL | Status: DC | PRN
  Administered 2023-10-05 – 2023-10-14 (×5): 25 mg via ORAL
  Filled 2023-10-05 (×5): qty 1

## 2023-10-05 MED ORDER — APIXABAN 5 MG PO TABS
5.0000 mg | ORAL_TABLET | Freq: Two times a day (BID) | ORAL | Status: DC
Start: 2023-10-05 — End: 2023-10-07
  Administered 2023-10-05 – 2023-10-06 (×2): 5 mg via ORAL
  Filled 2023-10-05 (×2): qty 1

## 2023-10-05 MED ORDER — THIAMINE HCL 100 MG/ML IJ SOLN
100.0000 mg | Freq: Once | INTRAMUSCULAR | Status: AC
  Administered 2023-10-05: 100 mg via INTRAVENOUS
  Filled 2023-10-05: qty 2

## 2023-10-05 MED ORDER — LORAZEPAM 1 MG PO TABS
1.0000 mg | ORAL_TABLET | ORAL | Status: AC | PRN
Start: 2023-10-05 — End: 2023-10-08
  Administered 2023-10-06: 1 mg via ORAL
  Filled 2023-10-05: qty 1

## 2023-10-05 MED ORDER — GABAPENTIN 300 MG PO CAPS
300.0000 mg | ORAL_CAPSULE | Freq: Three times a day (TID) | ORAL | Status: DC
  Administered 2023-10-05 – 2023-10-16 (×25): 300 mg via ORAL
  Filled 2023-10-05 (×29): qty 1

## 2023-10-05 MED ORDER — HYDRALAZINE HCL 20 MG/ML IJ SOLN: 5.0000 mg | Freq: Four times a day (QID) | INTRAMUSCULAR | Status: DC | PRN

## 2023-10-05 MED ORDER — POTASSIUM CHLORIDE CRYS ER 20 MEQ PO TBCR
40.0000 meq | EXTENDED_RELEASE_TABLET | Freq: Once | ORAL | Status: AC
  Administered 2023-10-05: 40 meq via ORAL
  Filled 2023-10-05: qty 2

## 2023-10-05 MED ORDER — PANTOPRAZOLE SODIUM 40 MG PO TBEC
40.0000 mg | DELAYED_RELEASE_TABLET | Freq: Every day | ORAL | Status: DC
  Administered 2023-10-05 – 2023-10-06 (×2): 40 mg via ORAL
  Filled 2023-10-05 (×2): qty 1

## 2023-10-05 MED ORDER — ALBUTEROL SULFATE (2.5 MG/3ML) 0.083% IN NEBU: 2.5000 mg | INHALATION_SOLUTION | RESPIRATORY_TRACT | Status: DC | PRN

## 2023-10-05 MED ORDER — METOPROLOL TARTRATE 25 MG PO TABS
25.0000 mg | ORAL_TABLET | Freq: Two times a day (BID) | ORAL | Status: DC
  Administered 2023-10-05 – 2023-10-06 (×2): 25 mg via ORAL
  Filled 2023-10-05 (×2): qty 1

## 2023-10-05 MED ORDER — CHLORHEXIDINE GLUCONATE CLOTH 2 % EX PADS
6.0000 | MEDICATED_PAD | Freq: Every day | CUTANEOUS | Status: DC
  Administered 2023-10-05 – 2023-10-14 (×9): 6 via TOPICAL

## 2023-10-05 MED ORDER — LORAZEPAM 2 MG/ML IJ SOLN
1.0000 mg | INTRAMUSCULAR | Status: AC | PRN
  Administered 2023-10-06 (×2): 4 mg via INTRAVENOUS
  Administered 2023-10-06: 3 mg via INTRAVENOUS
  Administered 2023-10-06 – 2023-10-08 (×14): 4 mg via INTRAVENOUS
  Filled 2023-10-05 (×17): qty 2

## 2023-10-05 MED ORDER — LORAZEPAM 1 MG PO TABS
1.0000 mg | ORAL_TABLET | Freq: Once | ORAL | Status: AC
  Administered 2023-10-05: 1 mg via ORAL
  Filled 2023-10-05: qty 1

## 2023-10-05 MED ORDER — THIAMINE MONONITRATE 100 MG PO TABS
100.0000 mg | ORAL_TABLET | Freq: Every day | ORAL | Status: DC
Start: 2023-10-05 — End: 2023-10-07
  Administered 2023-10-06: 100 mg via ORAL
  Filled 2023-10-05: qty 1

## 2023-10-05 MED ORDER — THIAMINE HCL 100 MG/ML IJ SOLN
100.0000 mg | Freq: Every day | INTRAMUSCULAR | Status: DC
  Administered 2023-10-05: 100 mg via INTRAVENOUS
  Filled 2023-10-05: qty 2

## 2023-10-05 MED ORDER — MIRTAZAPINE 15 MG PO TABS
15.0000 mg | ORAL_TABLET | Freq: Every day | ORAL | Status: DC
  Administered 2023-10-05 – 2023-10-15 (×9): 15 mg via ORAL
  Filled 2023-10-05: qty 2
  Filled 2023-10-05: qty 1
  Filled 2023-10-05 (×2): qty 2
  Filled 2023-10-05 (×4): qty 1
  Filled 2023-10-05: qty 2
  Filled 2023-10-05: qty 1
  Filled 2023-10-05: qty 2
  Filled 2023-10-05 (×3): qty 1

## 2023-10-05 MED ORDER — AMLODIPINE BESYLATE 10 MG PO TABS
10.0000 mg | ORAL_TABLET | Freq: Every day | ORAL | Status: DC
  Administered 2023-10-05 – 2023-10-06 (×2): 10 mg via ORAL
  Filled 2023-10-05: qty 1
  Filled 2023-10-05: qty 2

## 2023-10-05 MED ORDER — FOLIC ACID 1 MG PO TABS
1.0000 mg | ORAL_TABLET | Freq: Every day | ORAL | Status: DC
  Administered 2023-10-05 – 2023-10-16 (×10): 1 mg via ORAL
  Filled 2023-10-05 (×11): qty 1

## 2023-10-05 MED ORDER — FUROSEMIDE 10 MG/ML IJ SOLN
20.0000 mg | Freq: Once | INTRAMUSCULAR | Status: AC
  Administered 2023-10-05: 20 mg via INTRAVENOUS
  Filled 2023-10-05: qty 4

## 2023-10-05 MED ORDER — METOPROLOL TARTRATE 5 MG/5ML IV SOLN
10.0000 mg | Freq: Once | INTRAVENOUS | Status: AC
  Administered 2023-10-05: 10 mg via INTRAVENOUS
  Filled 2023-10-05: qty 10

## 2023-10-05 MED ORDER — DOXEPIN HCL 50 MG PO CAPS
150.0000 mg | ORAL_CAPSULE | Freq: Every day | ORAL | Status: DC
Start: 2023-10-05 — End: 2023-10-05

## 2023-10-05 MED ORDER — IBUPROFEN 200 MG PO TABS
400.0000 mg | ORAL_TABLET | Freq: Four times a day (QID) | ORAL | Status: DC | PRN
  Administered 2023-10-05 – 2023-10-15 (×3): 400 mg via ORAL
  Filled 2023-10-05 (×3): qty 2

## 2023-10-05 MED ORDER — DILTIAZEM LOAD VIA INFUSION
15.0000 mg | Freq: Once | INTRAVENOUS | Status: DC
  Filled 2023-10-05: qty 15

## 2023-10-05 MED ORDER — ONDANSETRON HCL 4 MG PO TABS: 4.0000 mg | ORAL_TABLET | Freq: Four times a day (QID) | ORAL | Status: DC | PRN

## 2023-10-05 MED ORDER — ENOXAPARIN SODIUM 40 MG/0.4ML IJ SOSY: 40.0000 mg | PREFILLED_SYRINGE | Freq: Every day | INTRAMUSCULAR | Status: DC

## 2023-10-05 MED ORDER — DILTIAZEM HCL-DEXTROSE 125-5 MG/125ML-% IV SOLN (PREMIX)
5.0000 mg/h | INTRAVENOUS | Status: DC
  Administered 2023-10-05 – 2023-10-07 (×2): 5 mg/h via INTRAVENOUS
  Filled 2023-10-05 (×3): qty 125

## 2023-10-05 MED ORDER — ADULT MULTIVITAMIN W/MINERALS CH
1.0000 | ORAL_TABLET | Freq: Every day | ORAL | Status: DC
  Administered 2023-10-05 – 2023-10-09 (×4): 1 via ORAL
  Filled 2023-10-05 (×4): qty 1

## 2023-10-05 MED ORDER — MAGNESIUM OXIDE -MG SUPPLEMENT 400 (240 MG) MG PO TABS
400.0000 mg | ORAL_TABLET | Freq: Every day | ORAL | Status: DC
  Administered 2023-10-05 – 2023-10-16 (×11): 400 mg via ORAL
  Filled 2023-10-05 (×11): qty 1

## 2023-10-05 MED ORDER — ONDANSETRON HCL 4 MG/2ML IJ SOLN: 4.0000 mg | Freq: Four times a day (QID) | INTRAMUSCULAR | Status: DC | PRN

## 2023-10-05 NOTE — ED Provider Notes (Signed)
[place] EMERGENCY DEPARTMENT AT [facility] Provider Note   CSN: [id number] Arrival date & time: 10/05/23  1025     History  Chief Complaint  Patient presents with   Shortness of Breath    [name] is a 67 y.o. male.  With a history of alcohol use disorder, cirrhosis of the liver and atrial fibrillation who presents to the ED for shortness of breath.  Increasing dyspnea over the last 2 weeks.  Made worse with laying flat and minimal exertion.  Also notes increased peripheral edema of bilateral lower extremities.  Feels palpitations as though he is in atrial fibrillation.  Drinks 5 to 640 ounce beers daily.  Last drink this morning prior to arrival.  He does have a history of alcohol withdrawal seizures.  No chest pain, fevers, chills, abdominal pain, nausea, vomiting.   Shortness of Breath      Home Medications Prior to Admission medications   Medication Sig Start Date End Date Taking? Authorizing Provider  amLODipine (NORVASC) 10 MG tablet Take 10 mg by mouth daily. 06/27/23   [provider]  chlorproMAZINE (THORAZINE) 25 MG tablet Take 25 mg by mouth 2 (two) times daily. 06/14/23   [provider]  doxepin (SINEQUAN) 150 MG capsule Take 150 mg by mouth at bedtime. 06/26/23   [provider]  gabapentin (NEURONTIN) 300 MG capsule Take 1 capsule (300 mg total) by mouth 3 (three) times daily. 04/03/23   Diamantina Monks, MD  hydrocortisone cream 1 % Apply 1 Application topically 2 (two) times daily. 05/14/23   [provider]  mirtazapine (REMERON) 7.5 MG tablet Take 2 tablets (15 mg total) by mouth at bedtime. 07/05/23 08/04/23  Hughie Closs, MD  Multiple Vitamin (MULTIVITAMIN WITH MINERALS) TABS tablet Take 1 tablet by mouth daily. 05/12/23   Karie Fetch, MD  omeprazole (PRILOSEC) 20 MG capsule Take 1 capsule (20 mg total) by mouth daily. 05/12/23 07/01/23  Karie Fetch, MD  ondansetron (ZOFRAN) 4 MG tablet Take 1 tablet (4  mg total) by mouth every 6 (six) hours. 07/19/23   Elpidio Anis, PA-C  tamsulosin (FLOMAX) 0.4 MG CAPS capsule Take 0.4 mg by mouth daily. Patient not taking: Reported on 07/01/2023 06/27/23   [provider]      Allergies    Carrot [daucus carota] and Carrot [daucus carota]    Review of Systems   Review of Systems  Respiratory:  Positive for shortness of breath.     Physical Exam Updated Vital Signs BP (!) 149/99   Pulse (!) 141   Temp 97.8 F (36.6 C) (Oral)   Resp 14   Ht 5\' 9"  (1.753 m)   Wt 76.7 kg   SpO2 93%   BMI 24.96 kg/m  Physical Exam Vitals and nursing note reviewed.  HENT:     Head: Normocephalic and atraumatic.  Eyes:     Pupils: Pupils are equal, round, and reactive to light.  Cardiovascular:     Rate and Rhythm: Tachycardia present. Rhythm irregular.  Pulmonary:     Effort: Pulmonary effort is normal.     Breath sounds: Examination of the right-middle field reveals rales. Examination of the right-lower field reveals rales. Examination of the left-lower field reveals rales. Rales present. No wheezing.  Abdominal:     Palpations: Abdomen is soft.     Tenderness: There is no abdominal tenderness.  Skin:    General: Skin is warm and dry.  Neurological:     Mental Status:  He is alert.  Psychiatric:        Mood and Affect: Mood normal.     ED Results / Procedures / Treatments   Labs (all labs ordered are listed, but only abnormal results are displayed) Labs Reviewed  BRAIN NATRIURETIC PEPTIDE - Abnormal; Notable for the following components:      Result Value   B Natriuretic Peptide 157.8 (*)    All other components within normal limits  COMPREHENSIVE METABOLIC PANEL - Abnormal; Notable for the following components:   Calcium 8.7 (*)    Total Protein 8.3 (*)    AST 51 (*)    All other components within normal limits  CBC WITH DIFFERENTIAL/PLATELET - Abnormal; Notable for the following components:   Hemoglobin 11.8 (*)    HCT 38.5 (*)     MCH 25.4 (*)    RDW 15.9 (*)    Platelets 141 (*)    All other components within normal limits  RESP PANEL BY RT-PCR (RSV, FLU A&B, COVID)  [id number]  MAGNESIUM  RAPID URINE DRUG SCREEN, HOSP PERFORMED  TROPONIN I (HIGH SENSITIVITY)  TROPONIN I (HIGH SENSITIVITY)    EKG EKG Interpretation Date/Time:  Thursday October 05 2023 10:41:33 EST Ventricular Rate:  146 PR Interval:    QRS Duration:  93 QT Interval:  307 QTC Calculation: 479 R Axis:   71  Text Interpretation: Atrial fibrillation Low voltage, precordial leads Borderline prolonged QT interval Confirmed by Estelle June [phone]) on 10/05/2023 2:24:06 PM  Radiology No results found.  Procedures Ultrasound ED Echo  Date/Time: 10/05/2023 11:25 AM  Performed by: Royanne Foots, DO Authorized by: Royanne Foots, DO   Procedure details:    Indications: dyspnea     Views: subxiphoid, parasternal long axis view, parasternal short axis view, apical 4 chamber view and IVC view     Images: archived   Findings:    Pericardium: no pericardial effusion     Cardiac Activity: hyperdynamic     LV Function: depressed (30 - 50%)     RV Diameter: normal     IVC: dilated   Impression:    Impression comment:  Hyperdynamic cardiac activity with dilated IVC Ultrasound ED Thoracic  Date/Time: 10/05/2023 11:26 AM  Performed by: Royanne Foots, DO Authorized by: Royanne Foots, DO   Procedure details:    Indications: dyspnea     Assessment for:  Pleural effusion, pneumothorax and interstitial syndrome   Left lung pleural:  Visualized   Right lung pleural:  Visualized   Images: archived   Findings:    B-lines noted throughout: identified   Right Lung Findings:     no right lung sliding    right lung pleural effusion      Left Lung Findings:     no left lung sliding Impression:    Impression: pulmonary edema     Impression comment:  B-lines throughout right greater than left with right-sided pleural effusion      Medications Ordered in ED Medications  folic acid (FOLVITE) tablet 1 mg (1 mg Oral Given 10/05/23 1356)  magnesium oxide (MAG-OX) tablet 400 mg (400 mg Oral Given 10/05/23 1216)  LORazepam (ATIVAN) tablet 1-4 mg (has no administration in time range)    Or  LORazepam (ATIVAN) injection 1-4 mg (has no administration in time range)  thiamine (VITAMIN B1) tablet 100 mg (has no administration in time range)    Or  thiamine (VITAMIN B1) injection 100 mg (has no administration in time range)  folic acid (FOLVITE) tablet 1 mg (has no administration in time range)  multivitamin with minerals tablet 1 tablet (has no administration in time range)  metoprolol tartrate (LOPRESSOR) tablet 25 mg (has no administration in time range)  amLODipine (NORVASC) tablet 10 mg (has no administration in time range)  doxepin (SINEQUAN) capsule 150 mg (has no administration in time range)  mirtazapine (REMERON) tablet 15 mg (has no administration in time range)  pantoprazole (PROTONIX) EC tablet 40 mg (has no administration in time range)  gabapentin (NEURONTIN) capsule 300 mg (has no administration in time range)  enoxaparin (LOVENOX) injection 40 mg (has no administration in time range)  ibuprofen (ADVIL) tablet 400 mg (has no administration in time range)  traZODone (DESYREL) tablet 25 mg (has no administration in time range)  ondansetron (ZOFRAN) tablet 4 mg (has no administration in time range)    Or  ondansetron (ZOFRAN) injection 4 mg (has no administration in time range)  albuterol (PROVENTIL) (2.5 MG/3ML) 0.083% nebulizer solution 2.5 mg (has no administration in time range)  hydrALAZINE (APRESOLINE) injection 5 mg (has no administration in time range)  furosemide (LASIX) injection 20 mg (20 mg Intravenous Given 10/05/23 1215)  thiamine (VITAMIN B1) injection 100 mg (100 mg Intravenous Given 10/05/23 1216)  LORazepam (ATIVAN) tablet 1 mg (1 mg Oral Given 10/05/23 1356)    ED Course/ Medical  Decision Making/ A&P Clinical Course as of 10/05/23 1424  Thu Oct 05, 2023  1418 Laboratory workup notable for elevated BNP, thrombocytopenia and elevated AST consistent with known cirrhosis.  Stable from respiratory standpoint with rate still oscillating in 110s and 140s.  Remains hemodynamically stable.  Discussed with admitting hospitalist who will trial metoprolol for better rate control since the rate does not come down after initiation of diuresis.  Patient will be admitted to medicine [MP]    Clinical Course User Index [MP] Royanne Foots, DO                                 Medical Decision Making 67 year old male with history as above presenting for increased dyspnea on exertion and orthopnea progressive over the last 2 weeks.  Also notes palpitations today.  No reported chest pain.  Initial vital signs notable for tachycardia with rate oscillating between 110s and 130s irregularly irregular rhythm consistent with atrial fibrillation.  Slightly hypertensive.  Afebrile.  No tachypnea on room air.  Exam notable for bilateral rales and increased peripheral edema bilateral lower extremities.  Irregular irregular rhythm.  Hemodynamically stable.  Bedside ultrasound reveals pulmonary edema right greater than left with right-sided pleural effusion.  Differential diagnosis most consistent with decompensated cirrhosis versus pulmonary edema in setting of acute heart failure.  Will provide supplemental oxygen as needed and diuresis with Lasix.  Will also evaluate for ACS with high-sensitivity troponin and EKG.  Will obtain BNP laboratory workup including CMP and CBC here.  Regarding atrial fibrillation, patient is not anticoagulated.  Will treat underlying cause most likely volume overload before considering adding medications for rate control.  Diltiazem would certainly put him at risk for worsening pulmonary edema at this time.  Could consider metoprolol or electrocardioversion if rate does not improve  after diuresis or he becomes unstable.  Admission likely.  Amount and/or Complexity of Data Reviewed Labs: ordered. Radiology: ordered.  Risk OTC drugs. Prescription drug management. Decision regarding hospitalization.           Final Clinical  Impression(s) / ED Diagnoses Final diagnoses:  Cirrhosis of liver without ascites, unspecified hepatic cirrhosis type (HCC)  Atrial fibrillation with rapid ventricular response (HCC)  Acute pulmonary edema [facility])    Rx / DC Orders ED Discharge Orders     None         Royanne Foots, DO 10/05/23 1424
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
## 2023-10-05 NOTE — Discharge Instructions (Addendum)
Information on my medicine - ELIQUIS® (apixaban) ° °Why was Eliquis® prescribed for you? °Eliquis® was prescribed for you to reduce the risk of a blood clot forming that can cause a stroke if you have a medical condition called atrial fibrillation (a type of irregular heartbeat). ° °What do You need to know about Eliquis® ? °Take your Eliquis® TWICE DAILY - one tablet in the morning and one tablet in the evening with or without food. If you have difficulty swallowing the tablet whole please discuss with your pharmacist how to take the medication safely. ° °Take Eliquis® exactly as prescribed by your doctor and DO NOT stop taking Eliquis® without talking to the doctor who prescribed the medication.  Stopping may increase your risk of developing a stroke.  Refill your prescription before you run out. ° °After discharge, you should have regular check-up appointments with your healthcare provider that is prescribing your Eliquis®.  In the future your dose may need to be changed if your kidney function or weight changes by a significant amount or as you get older. ° °What do you do if you miss a dose? °If you miss a dose, take it as soon as you remember on the same day and resume taking twice daily.  Do not take more than one dose of ELIQUIS at the same time to make up a missed dose. ° °Important Safety Information °A possible side effect of Eliquis® is bleeding. You should call your healthcare provider right away if you experience any of the following: °? Bleeding from an injury or your nose that does not stop. °? Unusual colored urine (red or dark brown) or unusual colored stools (red or black). °? Unusual bruising for unknown reasons. °? A serious fall or if you hit your head (even if there is no bleeding). ° °Some medicines may interact with Eliquis® and might increase your risk of bleeding or clotting while on Eliquis®. To help avoid this, consult your healthcare provider or pharmacist prior to using any new  prescription or non-prescription medications, including herbals, vitamins, non-steroidal anti-inflammatory drugs (NSAIDs) and supplements. ° °This website has more information on Eliquis® (apixaban): http://www.eliquis.com/eliquis/home ° °
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
## 2023-10-05 NOTE — ED Triage Notes (Addendum)
Patient BIB [facility] from home. Shortness of breath and dizzy for 2 weeks. Has a fib and feels like his heart is fluttering. Complaining of swollen legs. Stated when he walks 5 feet he gets dizzy.

## 2023-10-05 NOTE — Progress Notes (Signed)
Patient kept his wallet, cell phone, backpack, and clothing at beside. 40oz bottle of beer with sharp items kept secure outside patient room.

## 2023-10-06 ENCOUNTER — Inpatient Hospital Stay (HOSPITAL_COMMUNITY): Payer: Medicare HMO

## 2023-10-06 ENCOUNTER — Encounter (HOSPITAL_COMMUNITY): Payer: Self-pay | Admitting: Internal Medicine

## 2023-10-06 DIAGNOSIS — D649 Anemia, unspecified: Secondary | ICD-10-CM

## 2023-10-06 DIAGNOSIS — F10939 Alcohol use, unspecified with withdrawal, unspecified: Secondary | ICD-10-CM

## 2023-10-06 DIAGNOSIS — R569 Unspecified convulsions: Secondary | ICD-10-CM

## 2023-10-06 DIAGNOSIS — I5021 Acute systolic (congestive) heart failure: Secondary | ICD-10-CM

## 2023-10-06 DIAGNOSIS — I4819 Other persistent atrial fibrillation: Secondary | ICD-10-CM | POA: Diagnosis not present

## 2023-10-06 DIAGNOSIS — I509 Heart failure, unspecified: Secondary | ICD-10-CM

## 2023-10-06 DIAGNOSIS — I4891 Unspecified atrial fibrillation: Secondary | ICD-10-CM | POA: Diagnosis not present

## 2023-10-06 LAB — ECHOCARDIOGRAM COMPLETE
Calc EF: 38.7 %
Height: 69 in
S' Lateral: 3.5 cm
Single Plane A2C EF: 46.1 %
Single Plane A4C EF: 27.9 %
Weight: 2704 [oz_av]

## 2023-10-06 LAB — CBC
HCT: 35.3 % — ABNORMAL LOW (ref 39.0–52.0)
Hemoglobin: 10.9 g/dL — ABNORMAL LOW (ref 13.0–17.0)
MCH: 25.5 pg — ABNORMAL LOW (ref 26.0–34.0)
MCHC: 30.9 g/dL (ref 30.0–36.0)
MCV: 82.7 fL (ref 80.0–100.0)
Platelets: 130 10*3/uL — ABNORMAL LOW (ref 150–400)
RBC: 4.27 MIL/uL (ref 4.22–5.81)
RDW: 16 % — ABNORMAL HIGH (ref 11.5–15.5)
WBC: 4.4 10*3/uL (ref 4.0–10.5)
nRBC: 0 % (ref 0.0–0.2)

## 2023-10-06 LAB — COMPREHENSIVE METABOLIC PANEL
ALT: 26 U/L (ref 0–44)
AST: 35 U/L (ref 15–41)
Albumin: 3 g/dL — ABNORMAL LOW (ref 3.5–5.0)
Alkaline Phosphatase: 69 U/L (ref 38–126)
Anion gap: 11 (ref 5–15)
BUN: 15 mg/dL (ref 8–23)
CO2: 21 mmol/L — ABNORMAL LOW (ref 22–32)
Calcium: 8.5 mg/dL — ABNORMAL LOW (ref 8.9–10.3)
Chloride: 104 mmol/L (ref 98–111)
Creatinine, Ser: 0.98 mg/dL (ref 0.61–1.24)
GFR, Estimated: >60 mL/min (ref >60)
Glucose, Bld: 111 mg/dL — ABNORMAL HIGH (ref 70–99)
Potassium: 4 mmol/L (ref 3.5–5.1)
Sodium: 136 mmol/L (ref 135–145)
Total Bilirubin: 1.2 mg/dL — ABNORMAL HIGH (ref <1.2)
Total Protein: 7.1 g/dL (ref 6.5–8.1)

## 2023-10-06 LAB — MRSA NEXT GEN BY PCR, NASAL: MRSA by PCR Next Gen: DETECTED — AB

## 2023-10-06 MED ORDER — SPIRONOLACTONE 12.5 MG HALF TABLET
12.5000 mg | ORAL_TABLET | Freq: Every day | ORAL | Status: DC
  Filled 2023-10-06 (×2): qty 1

## 2023-10-06 MED ORDER — FUROSEMIDE 10 MG/ML IJ SOLN
20.0000 mg | Freq: Two times a day (BID) | INTRAMUSCULAR | Status: DC
  Administered 2023-10-06: 20 mg via INTRAVENOUS
  Filled 2023-10-06: qty 2

## 2023-10-06 MED ORDER — MUPIROCIN 2 % EX OINT
1.0000 | TOPICAL_OINTMENT | Freq: Two times a day (BID) | CUTANEOUS | Status: AC
  Administered 2023-10-06 – 2023-10-10 (×9): 1 via NASAL
  Filled 2023-10-06: qty 22

## 2023-10-06 MED ORDER — DEXMEDETOMIDINE HCL IN NACL 200 MCG/50ML IV SOLN
0.0000 ug/kg/h | INTRAVENOUS | Status: DC
  Administered 2023-10-06: 0.2 ug/kg/h via INTRAVENOUS
  Administered 2023-10-06: 0.6 ug/kg/h via INTRAVENOUS
  Administered 2023-10-07 (×3): 0.7 ug/kg/h via INTRAVENOUS
  Administered 2023-10-07: 0.6 ug/kg/h via INTRAVENOUS
  Administered 2023-10-07: 0.8 ug/kg/h via INTRAVENOUS
  Administered 2023-10-08: 0.6 ug/kg/h via INTRAVENOUS
  Administered 2023-10-08: 0.8 ug/kg/h via INTRAVENOUS
  Filled 2023-10-06 (×11): qty 50

## 2023-10-06 MED ORDER — LORAZEPAM 2 MG/ML IJ SOLN
1.0000 mg | INTRAMUSCULAR | Status: DC | PRN
  Administered 2023-10-08: 2 mg via INTRAVENOUS
  Filled 2023-10-06: qty 1

## 2023-10-06 MED ORDER — PERFLUTREN LIPID MICROSPHERE
1.0000 mL | INTRAVENOUS | Status: AC | PRN
  Administered 2023-10-06: 5 mL via INTRAVENOUS

## 2023-10-06 MED ORDER — FUROSEMIDE 10 MG/ML IJ SOLN
40.0000 mg | Freq: Two times a day (BID) | INTRAMUSCULAR | Status: DC
  Administered 2023-10-06 – 2023-10-07 (×3): 40 mg via INTRAVENOUS
  Filled 2023-10-06 (×4): qty 4

## 2023-10-06 MED ORDER — POLYVINYL ALCOHOL 1.4 % OP SOLN
1.0000 [drp] | OPHTHALMIC | Status: DC | PRN
  Administered 2023-10-13 – 2023-10-14 (×3): 1 [drp] via OPHTHALMIC
  Filled 2023-10-06: qty 15
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
## 2023-10-06 NOTE — Plan of Care (Signed)
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
## 2023-10-06 NOTE — Progress Notes (Signed)
  Echocardiogram 2D Echocardiogram has been performed.  Leda Roys RDCS 10/06/2023, 2:38 PM

## 2023-10-06 NOTE — Consult Note (Addendum)
Cardiology Consultation   Patient ID: [name] MRN: [id number]; DOB: 03/13/1956  Admit date: 10/05/2023 Date of Consult: 10/06/2023  PCP:  Patient, No Pcp Per   [facility] Providers Cardiologist:  New (Dr. Elease Hashimoto) Click here to update MD or APP on Care Team, Refresh:1}     Patient Profile:   [name] is a 67 y.o. male with a history of hypertension, hyperlipidemia, hepatitis C, hepatic cirrhosis, polysubstance abuse (crack cocaine and alcohol abuse), and depression with prior suicidal and homicidal ideation as well as multiple prior suicide attempts with self-inflicted stab wounds to the abdomen who is being seen 10/06/2023 for the evaluation of CHF and new onset atrial fibrillation at the request of Dr. Marland Mcalpine.  History of Present Illness:   [name] is a 67 year old male with the above history.  I do not see any prior cardiac evaluation in the consistent more in Care Everywhere. He does have a history of hepatitis C and hepatic cirrhosis as well as substance abuse with crack cocaine and alcohol.  He also has a history of depression with prior suicidal and homicidal ideation as well as a multiple suicide attempts with self-inflicted stab wounds to the abdomen requiring multiple exploratory laparotomies.. He has had multiple admissions though for alcohol abuse/intoxication with prior withdrawal seizures.  He was admitted in 03/2023 after an another suicide attempt with self-inflicted's stab wound to the abdomen.  He underwent exploratory laparotomy with extensive adhesiolysis and repair of a small bowel injury.  He was most recently admitted in 06/2023 for alcohol withdrawal and fever.  He was referred from the [facility] where he had initially presented with active suicidal thoughts and ideation and active homicidal ideation.  He was ultimately diagnosed with sepsis secondary to right upper lobe pneumonia as well as acute cystitis and was treated  with antibiotics.  Position was complicated by acute metabolic encephalopathy felt to be due to combination of alcohol withdrawal and use of Ativan.  However, there was also concern for possible stroke.  CT was concerning for possible stroke but subsequent brain MRI was unremarkable.  He was seen in the ED on 07/19/2023 for hematemesis.  Hemoglobin as were stable in the ED and he had no observed emesis. Therefore, he was felt to be stable for discharge.  He presented to the [facility] ED on 10/05/2019 for for further evaluation of shortness of breath and lower extremity edema. Upon arrival to the ED, he was noted to be tachycardic mildly hypertensive. EKG showed atrial fibrillation, rate 144 bpm, with nonspecific T wave changes.  High-sensitivity troponin negative x2.  BNP mildly elevated at 157.8.  Chest x-ray showed an enlarged heart with vascular congestion and small effusions. WBC 4.6, Hgb 11.8, Plts 141. Na 136, K 3.6, Glcuose 80, BUN 12, Cr 0.86. Albumin 3.7, AST 51, ALT 32, Alk Phos 83, Total Bili 1.1. Mag 2.0. Urine drug screen negative.  He was given a dose of IV Lasix and IV Metoprolol. He was admitted and Cardiology was consulted for further evaluation.  Patient reports he was in prison from 2010 to 2023. He is currently homeless. He just got back into a shelter 2 days ago. He states he had a possible stroke in the 1980s but no known heart disease. He reports he has had significant dyspnea with minimal exertion for the past several months.  He has had this at least since his last admission in 06/2023 for pneumonia he does not report any  significant improvement in his breathing after discharge.  He states he can only walk about 5 yards before getting acutely short of breath.  He also describes orthopnea the last couple of days and then yesterday he noticed significant lower extremity edema.  Reports palpitations that he describes as "fluttering" for the last 6 months.  He reports some chest discomfort.  It sounds like this is mostly just palpitations but he does describe occasional sharp chest pain and pressure as well.  He reports intermittent dizziness especially with standing quickly.  He does report he has had prior syncopal episodes but is difficult to get the details around these events.  It is also difficult to know whether this was just in the setting of his alcohol use.  His last syncopal episode was prior to his last admission in 06/2023 but then when I ask him questions about this he basically just says he thinks he passed out because he does not remember how he got to the ED.  He has chronic nasal congestion and productive cough no recent fevers or illnesses.  No nausea, vomiting, diarrhea.  He does describe some mild hematemesis in the past but states the last episode of this was probably 6 weeks ago.  He denies any other abnormal bleeding occluding hemoptysis, hematuria, hematochezia, or melena.  He does have significant alcohol use and drinks five to six 40 ounce beers every day.  He denies any liquor use.  He also reports occasional cocaine use states he last used this 6 weeks ago.  He states he is interested in quitting drinking but he does not have a rehab facility to go to.  He states he is always depressed but does not currently have any suicidal or homicidal thoughts.  Denies any family history of heart disease and states all his family died from lung disease.  Past Medical History:  Diagnosis Date   Alcohol abuse    Anxiety    Cirrhosis (HCC)    Depression    Hep C w/o coma, chronic (HCC)    Hepatitis C    HTN (hypertension)    Hypertension    Liver cirrhosis (HCC)    Pancreatitis    Substance abuse (HCC)    crack cocaine   Suicide attempt (HCC)    Thyroid disease     Past Surgical History:  Procedure Laterality Date   ABDOMINAL SURGERY     EXPLORATORY LAPAROTOMY  02/08/2011   for self inflicted SW; oversew bleeding omentum   EYE SURGERY     HERNIA REPAIR     IR  SINUS/FIST TUBE CHK-NON GI  03/28/2023   LAPAROTOMY  02/08/2012   Procedure: EXPLORATORY LAPAROTOMY;  Surgeon: Almond Lint, MD;  Location: [facility] OR;  Service: General;  Laterality: N/A;  exploratory laparotomy, wound exploration and repair of traumatic hernia   LAPAROTOMY     LAPAROTOMY N/A 12/01/2020   Procedure: EXPLORATORY LAPAROTOMY; Repair of traumatic enterotomy; Closure of abdominal stab wound;  Surgeon: Quentin Ore, MD;  Location: [facility] OR;  Service: General;  Laterality: N/A;   LAPAROTOMY     2012, 2013, 2022   LAPAROTOMY N/A 02/23/2023   Procedure: EXPLORATORY LAPAROTOMY;  Surgeon: Rodman Pickle, MD;  Location: [facility] OR;  Service: General;  Laterality: N/A;   LYSIS OF ADHESION N/A 12/01/2020   Procedure: LYSIS OF ADHESION;  Surgeon: Quentin Ore, MD;  Location: [facility] OR;  Service: General;  Laterality: N/A;     Home Medications:  Prior to Admission medications  Medication Sig Start Date End Date Taking? Authorizing Provider  amLODipine (NORVASC) 10 MG tablet Take 10 mg by mouth in the morning. 06/27/23  Yes [provider]  ARTIFICIAL TEAR SOLUTION OP Place 1 drop into both eyes in the morning.   Yes [provider]  diltiazem (CARDIZEM) 60 MG tablet Take 60 mg by mouth 3 (three) times daily.   Yes [provider]  doxycycline (MONODOX) 100 MG capsule Take 100 mg by mouth 2 (two) times daily. 10/03/23 10/10/23 Yes [provider]  gabapentin (NEURONTIN) 300 MG capsule Take 1 capsule (300 mg total) by mouth 3 (three) times daily. 04/03/23  Yes Diamantina Monks, MD  mirtazapine (REMERON) 15 MG tablet Take 60-75 mg by mouth at bedtime as needed (for sleep).   Yes [provider]  MOTRIN IB 200 MG tablet Take 600-800 mg by mouth 2 (two) times daily.   Yes [provider]  pantoprazole (PROTONIX) 40 MG tablet Take 40 mg by mouth daily before breakfast.   Yes [provider]  rosuvastatin (CRESTOR) 20 MG tablet Take  20 mg by mouth daily.   Yes [provider]  mirtazapine (REMERON) 7.5 MG tablet Take 2 tablets (15 mg total) by mouth at bedtime. Patient not taking: Reported on 10/05/2023 07/05/23 10/05/23  Hughie Closs, MD  Multiple Vitamin (MULTIVITAMIN WITH MINERALS) TABS tablet Take 1 tablet by mouth daily. Patient not taking: Reported on 10/05/2023 05/12/23   Karie Fetch, MD  omeprazole (PRILOSEC) 20 MG capsule Take 1 capsule (20 mg total) by mouth daily. Patient not taking: Reported on 10/05/2023 05/12/23 10/05/23  Karie Fetch, MD  ondansetron (ZOFRAN) 4 MG tablet Take 1 tablet (4 mg total) by mouth every 6 (six) hours. Patient not taking: Reported on 10/05/2023 07/19/23   Elpidio Anis, PA-C    Inpatient Medications: Scheduled Meds:  apixaban  5 mg Oral BID   Chlorhexidine Gluconate Cloth  6 each Topical Daily   diltiazem  15 mg Intravenous Once   folic acid  1 mg Oral Daily   folic acid  1 mg Oral Daily   furosemide  40 mg Intravenous BID   gabapentin  300 mg Oral TID   magnesium oxide  400 mg Oral Daily   metoprolol tartrate  25 mg Oral BID   mirtazapine  15 mg Oral QHS   multivitamin with minerals  1 tablet Oral Daily   mupirocin ointment  1 Application Nasal BID   pantoprazole  40 mg Oral Daily   spironolactone  12.5 mg Oral Daily   thiamine  100 mg Oral Daily   Or   thiamine  100 mg Intravenous Daily   Continuous Infusions:  diltiazem (CARDIZEM) infusion 7.5 mg/hr (10/06/23 0357)   PRN Meds: albuterol, camphor-menthol, hydrALAZINE, ibuprofen, LORazepam **OR** LORazepam, ondansetron **OR** ondansetron (ZOFRAN) IV, mouth rinse, polyvinyl alcohol, traZODone  Allergies:    Allergies  Allergen Reactions   Carrot [Daucus Carota] Anaphylaxis, Swelling and Other (See Comments)    Lips swell- had to receive Benadryl    Social History:   Social History   Socioeconomic History   Marital status: Single    Spouse name: Not on file   Number of children: Not on file    Years of education: Not on file   Highest education level: Not on file  Occupational History   Not on file  Tobacco Use   Smoking status: Every Day    Types: Cigarettes    Passive exposure: Current   Smokeless  tobacco: Former  Building services engineer status: Never Used  Substance and Sexual Activity   Alcohol use: Yes    Comment: 40 oz   Drug use: Yes    Types: "Crack" cocaine    Comment: last use a week ago   Sexual activity: Not on file  Other Topics Concern   Not on file  Social History Narrative   ** Merged History Encounter **       ** Merged History Encounter **       ** Merged History Encounter **       ** Merged History Encounter **       ** Merged History Encounter **       Social Drivers of Corporate investment banker Strain: Not on file  Food Insecurity: No Food Insecurity (10/05/2023)   Hunger Vital Sign    Worried About Running Out of Food in the Last Year: Never true    Ran Out of Food in the Last Year: Never true  Transportation Needs: No Transportation Needs (10/05/2023)   PRAPARE - Administrator, Civil Service (Medical): No    Lack of Transportation (Non-Medical): No  Physical Activity: Not on file  Stress: Not on file  Social Connections: Not on file  Intimate Partner Violence: Not At Risk (10/05/2023)   Humiliation, Afraid, Rape, and Kick questionnaire    Fear of Current or Ex-Partner: No    Emotionally Abused: No    Physically Abused: No    Sexually Abused: No    Family History:   Family History  Problem Relation Age of Onset   Heart disease Neg Hx      ROS:  Please see the history of present illness.  Review of Systems  Constitutional:  Negative for fever.  HENT:  Positive for congestion.   Respiratory:  Positive for cough, sputum production and shortness of breath. Negative for hemoptysis.   Cardiovascular:  Positive for chest pain, palpitations, orthopnea and leg swelling.  Gastrointestinal:  Negative for blood in  stool, melena, nausea and vomiting.  Genitourinary:  Negative for hematuria.  Musculoskeletal:  Negative for myalgias.  Neurological:  Positive for dizziness.  Endo/Heme/Allergies:  Does not bruise/bleed easily.  Psychiatric/Behavioral:  Positive for depression and substance abuse.     Physical Exam/Data:   Vitals:   10/06/23 0827 10/06/23 0915 10/06/23 0930 10/06/23 0945  BP:  (!) 131/51 130/66 (!) 145/103  Pulse: (!) 147 (!) 147 (!) 149 (!) 129  Resp: (!) 23 (!) 21 (!) 28 17  Temp:      TempSrc:      SpO2: 96% 95% 93% 91%  Weight:      Height:        Intake/Output Summary (Last 24 hours) at 10/06/2023 0950 Last data filed at 10/06/2023 0426 Gross per 24 hour  Intake 60 ml  Output 700 ml  Net -640 ml      10/05/2023   10:32 AM 07/19/2023    9:39 PM 07/04/2023    6:42 AM  Last 3 Weights  Weight (lbs) 169 lb 169 lb 12.1 oz 169 lb 15.6 oz  Weight (kg) 76.658 kg 77 kg 77.1 kg     Body mass index is 24.96 kg/m.  General: 67 y.o. Caucasian male resting comfortably in no acute distress. On 2L of O2 via nasal cannula. HEENT: Normocephalic and atraumatic. Sclera clear. EOMs intact. Neck: Supple. Prominent neck veins. Heart: Tachycardic with irregularly irregular rhythm.  No murmurs, gallops,  or rubs.  Lungs: Mild increased work of breathing. Decreased breath sounds but no wheezes, rhonchi, or rales. Abdomen: Firm, distended, and non-tender. MSK: Normal strength and tone for age. Extremities: 1-2+ pitting edema of bilateral lower extremities.  Skin: Warm and dry. Neuro: Alert and oriented x3. No focal deficits. Psych: Normal affect. Responds appropriately.  EKG:  The EKG was personally reviewed and demonstrates:  Atrial fibrillation, rate 144 bpm, with nonspecific T wave changes. Telemetry:  Telemetry was personally reviewed and demonstrates:  Atrial fibrillation with rates mostly in the 120s but as high as the 140s to 150s at times.  Relevant CV  Studies: N/A  Laboratory Data:  High Sensitivity Troponin:   Recent Labs  Lab 10/05/23 1100 10/05/23 1654  TROPONINIHS 7 8     Chemistry Recent Labs  Lab 10/05/23 1100 10/06/23 0305  NA 136 136  K 3.6 4.0  CL 103 104  CO2 23 21*  GLUCOSE 80 111*  BUN 12 15  CREATININE 0.86 0.98  CALCIUM 8.7* 8.5*  MG 2.0  --   GFRNONAA >60 >60  ANIONGAP 10 11    Recent Labs  Lab 10/05/23 1100 10/06/23 0305  PROT 8.3* 7.1  ALBUMIN 3.7 3.0*  AST 51* 35  ALT 32 26  ALKPHOS 83 69  BILITOT 1.1 1.2*   Lipids No results for input(s): "CHOL", "TRIG", "HDL", "LABVLDL", "LDLCALC", "CHOLHDL" in the last 168 hours.  Hematology Recent Labs  Lab 10/05/23 1100 10/06/23 0305  WBC 4.6 4.4  RBC 4.64 4.27  HGB 11.8* 10.9*  HCT 38.5* 35.3*  MCV 83.0 82.7  MCH 25.4* 25.5*  MCHC 30.6 30.9  RDW 15.9* 16.0*  PLT 141* 130*   Thyroid No results for input(s): "TSH", "FREET4" in the last 168 hours.  BNP Recent Labs  Lab 10/05/23 1100  BNP 157.8*    DDimer No results for input(s): "DDIMER" in the last 168 hours.   Radiology/Studies:  DG Chest Portable 1 View Result Date: 10/05/2023 CLINICAL DATA:  Shortness of breath and dizziness. EXAM: PORTABLE CHEST 1 VIEW COMPARISON:  X-ray 07/01/2023.  CTA 07/03/2023. FINDINGS: Under penetrated radiographs. Stable cardiopericardial silhouette with vascular congestion. Small effusions and lung base opacity. No pneumothorax. Overlapping cardiac leads. Calcified aorta. IMPRESSION: Enlarged heart with vascular congestion. Small effusions and adjacent opacity.  Recommend follow-up Electronically Signed   By: Karen Kays M.D.   On: 10/05/2023 15:03     Assessment and Plan:   Acute CHF - EF Unknown Patient presents with dyspnea on exertion as well as orthopnea and lower extremity edema.  BNP only mildly elevated in the 150s.  Chest x-ray showed  enlarged heart with vascular congestion and small effusions. He also has known cirrhosis.  Albumin only  slightly low at 3.0. He was given one dose of IV Lasix 20mg  in the ED with 700cc or urine output.  - He does has signs of volume overload.  - Echo pending. - Agree with increasing IV Lasix to 40mg  twice daily. - Continue Lopressor 25mg  twice daily. If EF is down, will need to switch to Toprol-XL or Coreg. - Will start Spironolactone 12.5mg  daily as this will also be good for both CHF and cirrhosis. - Will not be surprised if his EF will be down given significant alcohol abuse and rapid atrial fibrillation.  If EF is down, will need to continue to optimize GDMT.  - Continue to monitor daily weight, strict I/Os, and renal function.  New Onset Atrial Fibrillation  Patient ports palpitations  that he describes as a "fluttering" sensation prior the past several months.  He was noted to be in new onset atrial fibrillation on arrival with rates as high as the 140s to 150s.  He was initially given a dose of IV Metoprolol and then was started on IV Cardizem given persistent tachycardia. -He remains in atrial fibrillation with heart rates mostly in the 120s but occasionally as high as the 140s to 150s.   - Continue IV Diltiazem. - Continue Lopressor 25mg  twice daily. - Strongly suspect that alcohol withdrawal is contributing to his tachycardia. Would continue to treat his withdrawal symptoms. He is on CIWA protocol. - CHA2DS2-VASc = 6 (CHF, coronary calcifications noted on prior CT, HTN, stroke x2, age). He has already been started on Eliquis 5mg  twice daily here. However, I think he is high risk for long-term anticoagulation candidate given his significant alcohol abuse, severe depression with multiple suicide attempts with self-inflicted stab woundsto the abdomen (most recently in 03/2023), and questions hematemesis. Will review this with MD.  - Given concerns with anticoagulation, will likely need to proceed with a rate control strategy going forward.  Hypertension BP soft at times but stable.Most recent  BP 131/51. - Continue IV Diltiazem. - Continue Lopressor 25mg  twice daily. - Will start Spironolactone 12.5mg  daily. - Will stop Amlodipine to allow for more rate control and GDMT if needed.  Otherwise, per primary team: - Cirrhosis - Alcohol abuse with withdrawal - Severe depression with multiple suicide attempts in the past - GERD - Hyperlipidemia - Homelessness    Risk Assessment/Risk Scores:    New York Heart Association (NYHA) Functional Class NYHA Class III-IV  CHA2DS2-VASc Score = 6  This indicates a 9.7% annual risk of stroke. The patient's score is based upon: CHF History: 1 HTN History: 1 Diabetes History: 0 Stroke History: 2 Vascular Disease History: 1 Age Score: 1 Gender Score: 0    For questions or updates, please contact [facility] Please consult [email or web address] for contact info under    Signed, Corrin Parker, PA-C  10/06/2023 9:50 AM   Attending Note:   The patient was seen and examined.  Agree with assessment and plan as noted above.  Changes made to the above note as needed.  Patient seen and independently examined with Marjie Skiff, PA .   We discussed all aspects of the encounter. I agree with the assessment and plan as stated above.   1.  Atrial fibrillation: He describes atrial fibrillation for the past several months.  He has had heart rates as high as the 140-150 range.  He is currently homeless and I am not sure how compliant he is.  I do not think that it will be possible to start him on any antiarrhythmics.  Our best hope is to achieve rate control and possibly anticoagulation.  He has been started on Eliquis 5 mg twice a day.  He has multiple risk factors for bleeding including significant alcohol abuse, severe depression with multiple suicide attempts (self-inflicted stab wounds).  I do think that we should make an attempt to have him on Eliquis and stressed the importance of correcting these other issues.  Agree with  starting low-dose beta-blocker for rate control.  Could also consider Cardizem.  2.  Acute congestive heart failure-exact etiology unknown  He has a long history of alcohol abuse, chronic cirrhosis, low albumin.  He is getting Lasix 40 mg twice a day.  Continue metoprolol but would consider transitioning to Toprol-XL or carvedilol  if his LVEF is depressed. Will also start him on low-dose spironolactone.  Will try to optimize his medical therapy for heart failure as well as possible.  3.  Hypertension: Blood pressure seems to be fairly stable at this point.  He may need additional medications for congestive heart failure as we collect more information.     I have spent a total of 40 minutes with patient reviewing hospital  notes , telemetry, EKGs, labs and examining patient as well as establishing an assessment and plan that was discussed with the patient.  > 50% of time was spent in direct patient care.    Vesta Mixer, Montez Hageman., MD, [facility] 10/06/2023, 3:11 PM [address],  [address] Office [phone] Pager [phone]

## 2023-10-07 ENCOUNTER — Inpatient Hospital Stay (HOSPITAL_COMMUNITY): Payer: Medicare HMO

## 2023-10-07 DIAGNOSIS — F10939 Alcohol use, unspecified with withdrawal, unspecified: Secondary | ICD-10-CM | POA: Diagnosis not present

## 2023-10-07 DIAGNOSIS — F10931 Alcohol use, unspecified with withdrawal delirium: Secondary | ICD-10-CM | POA: Diagnosis not present

## 2023-10-07 DIAGNOSIS — H5704 Mydriasis: Secondary | ICD-10-CM | POA: Diagnosis not present

## 2023-10-07 DIAGNOSIS — I5041 Acute combined systolic (congestive) and diastolic (congestive) heart failure: Secondary | ICD-10-CM | POA: Diagnosis not present

## 2023-10-07 DIAGNOSIS — I4891 Unspecified atrial fibrillation: Secondary | ICD-10-CM | POA: Diagnosis not present

## 2023-10-07 LAB — CBC WITH DIFFERENTIAL/PLATELET
Abs Immature Granulocytes: 0.01 10*3/uL (ref 0.00–0.07)
Basophils Absolute: 0 10*3/uL (ref 0.0–0.1)
Basophils Relative: 1 %
Eosinophils Absolute: 0.1 10*3/uL (ref 0.0–0.5)
Eosinophils Relative: 3 %
HCT: 36.9 % — ABNORMAL LOW (ref 39.0–52.0)
Hemoglobin: 11 g/dL — ABNORMAL LOW (ref 13.0–17.0)
Immature Granulocytes: 0 %
Lymphocytes Relative: 29 %
Lymphs Abs: 1.1 10*3/uL (ref 0.7–4.0)
MCH: 25.3 pg — ABNORMAL LOW (ref 26.0–34.0)
MCHC: 29.8 g/dL — ABNORMAL LOW (ref 30.0–36.0)
MCV: 85 fL (ref 80.0–100.0)
Monocytes Absolute: 0.5 10*3/uL (ref 0.1–1.0)
Monocytes Relative: 12 %
Neutro Abs: 2.1 10*3/uL (ref 1.7–7.7)
Neutrophils Relative %: 55 %
Platelets: 112 10*3/uL — ABNORMAL LOW (ref 150–400)
RBC: 4.34 MIL/uL (ref 4.22–5.81)
RDW: 15.9 % — ABNORMAL HIGH (ref 11.5–15.5)
WBC: 3.8 10*3/uL — ABNORMAL LOW (ref 4.0–10.5)
nRBC: 0 % (ref 0.0–0.2)

## 2023-10-07 LAB — COMPREHENSIVE METABOLIC PANEL
ALT: 22 U/L (ref 0–44)
AST: 29 U/L (ref 15–41)
Albumin: 2.9 g/dL — ABNORMAL LOW (ref 3.5–5.0)
Alkaline Phosphatase: 64 U/L (ref 38–126)
Anion gap: 9 (ref 5–15)
BUN: 14 mg/dL (ref 8–23)
CO2: 26 mmol/L (ref 22–32)
Calcium: 8.6 mg/dL — ABNORMAL LOW (ref 8.9–10.3)
Chloride: 104 mmol/L (ref 98–111)
Creatinine, Ser: 0.91 mg/dL (ref 0.61–1.24)
GFR, Estimated: >60 mL/min (ref >60)
Glucose, Bld: 104 mg/dL — ABNORMAL HIGH (ref 70–99)
Potassium: 3.8 mmol/L (ref 3.5–5.1)
Sodium: 139 mmol/L (ref 135–145)
Total Bilirubin: 1.5 mg/dL — ABNORMAL HIGH (ref <1.2)
Total Protein: 6.8 g/dL (ref 6.5–8.1)

## 2023-10-07 LAB — GLUCOSE, CAPILLARY: Glucose-Capillary: 114 mg/dL — ABNORMAL HIGH (ref 70–99)

## 2023-10-07 LAB — MAGNESIUM: Magnesium: 2 mg/dL (ref 1.7–2.4)

## 2023-10-07 LAB — PHOSPHORUS: Phosphorus: 3.7 mg/dL (ref 2.5–4.6)

## 2023-10-07 MED ORDER — METOPROLOL TARTRATE 5 MG/5ML IV SOLN
5.0000 mg | Freq: Four times a day (QID) | INTRAVENOUS | Status: DC
Start: 2023-10-07 — End: 2023-10-11
  Administered 2023-10-07 – 2023-10-11 (×17): 5 mg via INTRAVENOUS
  Filled 2023-10-07 (×17): qty 5

## 2023-10-07 MED ORDER — IOHEXOL 350 MG/ML SOLN
100.0000 mL | Freq: Once | INTRAVENOUS | Status: AC | PRN
Start: 2023-10-07 — End: 2023-10-07
  Administered 2023-10-07: 100 mL via INTRAVENOUS

## 2023-10-07 MED ORDER — THIAMINE HCL 100 MG/ML IJ SOLN
500.0000 mg | Freq: Three times a day (TID) | INTRAVENOUS | Status: AC
  Administered 2023-10-07 – 2023-10-09 (×6): 500 mg via INTRAVENOUS
  Filled 2023-10-07 (×6): qty 5

## 2023-10-07 MED ORDER — POTASSIUM CHLORIDE 10 MEQ/100ML IV SOLN
10.0000 meq | INTRAVENOUS | Status: AC
Start: 2023-10-07 — End: 2023-10-07
  Administered 2023-10-07 (×2): 10 meq via INTRAVENOUS
  Filled 2023-10-07 (×2): qty 100

## 2023-10-07 MED ORDER — THIAMINE HCL 100 MG/ML IJ SOLN: 100.0000 mg | Freq: Every day | INTRAMUSCULAR | Status: DC

## 2023-10-07 MED ORDER — PANTOPRAZOLE SODIUM 40 MG IV SOLR
40.0000 mg | INTRAVENOUS | Status: DC
  Administered 2023-10-07 – 2023-10-09 (×3): 40 mg via INTRAVENOUS
  Filled 2023-10-07 (×3): qty 10

## 2023-10-07 MED ORDER — POTASSIUM CHLORIDE CRYS ER 20 MEQ PO TBCR: 20.0000 meq | EXTENDED_RELEASE_TABLET | Freq: Once | ORAL | Status: DC

## 2023-10-07 MED ORDER — THIAMINE HCL 100 MG/ML IJ SOLN
250.0000 mg | Freq: Every day | INTRAVENOUS | Status: AC
  Administered 2023-10-09 – 2023-10-14 (×6): 250 mg via INTRAVENOUS
  Filled 2023-10-07 (×6): qty 2.5

## 2023-10-07 MED ORDER — APIXABAN 5 MG PO TABS
5.0000 mg | ORAL_TABLET | Freq: Two times a day (BID) | ORAL | Status: DC
  Administered 2023-10-07 – 2023-10-16 (×17): 5 mg via ORAL
  Filled 2023-10-07 (×19): qty 1
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
## 2023-10-07 NOTE — Plan of Care (Signed)
  Problem: Education: Goal: Knowledge of General Education information will improve Description: Including pain rating scale, medication(s)/side effects and non-pharmacologic comfort measures 10/07/2023 0721 by Hoover Brunette, RN Outcome: Progressing 10/07/2023 0721 by Hoover Brunette, RN Outcome: Progressing   Problem: Health Behavior/Discharge Planning: Goal: Ability to manage health-related needs will improve 10/07/2023 0721 by Hoover Brunette, RN Outcome: Progressing 10/07/2023 0721 by Hoover Brunette, RN Outcome: Progressing   Problem: Clinical Measurements: Goal: Ability to maintain clinical measurements within normal limits will improve 10/07/2023 0721 by Hoover Brunette, RN Outcome: Progressing 10/07/2023 0721 by Hoover Brunette, RN Outcome: Progressing Goal: Will remain free from infection 10/07/2023 0721 by Hoover Brunette, RN Outcome: Progressing 10/07/2023 0721 by Hoover Brunette, RN Outcome: Progressing Goal: Diagnostic test results will improve 10/07/2023 0721 by Hoover Brunette, RN Outcome: Progressing 10/07/2023 0721 by Hoover Brunette, RN Outcome: Progressing Goal: Respiratory complications will improve 10/07/2023 0721 by Hoover Brunette, RN Outcome: Progressing 10/07/2023 0721 by Hoover Brunette, RN Outcome: Progressing Goal: Cardiovascular complication will be avoided 10/07/2023 0721 by Hoover Brunette, RN Outcome: Progressing 10/07/2023 0721 by Hoover Brunette, RN Outcome: Progressing   Problem: Activity: Goal: Risk for activity intolerance will decrease 10/07/2023 0721 by Hoover Brunette, RN Outcome: Progressing 10/07/2023 0721 by Hoover Brunette, RN Outcome: Progressing   Problem: Nutrition: Goal: Adequate nutrition will be maintained 10/07/2023 0721 by Hoover Brunette, RN Outcome: Progressing 10/07/2023 0721 by Hoover Brunette, RN Outcome: Progressing   Problem: Coping: Goal: Level  of anxiety will decrease 10/07/2023 0721 by Hoover Brunette, RN Outcome: Progressing 10/07/2023 0721 by Hoover Brunette, RN Outcome: Progressing   Problem: Elimination: Goal: Will not experience complications related to bowel motility 10/07/2023 0721 by Hoover Brunette, RN Outcome: Progressing 10/07/2023 0721 by Hoover Brunette, RN Outcome: Progressing Goal: Will not experience complications related to urinary retention 10/07/2023 0721 by Hoover Brunette, RN Outcome: Progressing 10/07/2023 0721 by Hoover Brunette, RN Outcome: Progressing   Problem: Pain Management: Goal: General experience of comfort will improve 10/07/2023 0721 by Hoover Brunette, RN Outcome: Progressing 10/07/2023 0721 by Hoover Brunette, RN Outcome: Progressing   Problem: Safety: Goal: Ability to remain free from injury will improve 10/07/2023 0721 by Hoover Brunette, RN Outcome: Progressing 10/07/2023 0721 by Hoover Brunette, RN Outcome: Progressing   Problem: Skin Integrity: Goal: Risk for impaired skin integrity will decrease 10/07/2023 0721 by Hoover Brunette, RN Outcome: Progressing 10/07/2023 0721 by Hoover Brunette, RN Outcome: Progressing

## 2023-10-07 NOTE — Progress Notes (Signed)
IP Code Stroke cart activated @0859 . Dr. Marland Mcalpine at bedside for evaluation. LKWT 0400. Bedside reports non-reactive left pupil. On Eliquis for Afib, last dose within 48 hours. Currently on Precedex for acute agitation associated with ETOH withdrawal. Initial exam limited. mRS unknown.  Pt transported to CT @0902 . Dr. Iver Nestle paged @0902  and on camera @0904 .  No TNK d/t unclear LKWT and recent anticoagulant use.  TSRN dismissed @0930  by Dr. Iver Nestle. Telestroke Charity fundraiser

## 2023-10-07 NOTE — Plan of Care (Signed)
  Problem: Education: Goal: Knowledge of General Education information will improve Description: Including pain rating scale, medication(s)/side effects and non-pharmacologic comfort measures 10/07/2023 0722 by Hoover Brunette, RN Outcome: Progressing 10/07/2023 0721 by Hoover Brunette, RN Outcome: Progressing 10/07/2023 0721 by Hoover Brunette, RN Outcome: Progressing   Problem: Health Behavior/Discharge Planning: Goal: Ability to manage health-related needs will improve 10/07/2023 0722 by Hoover Brunette, RN Outcome: Progressing 10/07/2023 0721 by Hoover Brunette, RN Outcome: Progressing 10/07/2023 0721 by Hoover Brunette, RN Outcome: Progressing   Problem: Clinical Measurements: Goal: Ability to maintain clinical measurements within normal limits will improve 10/07/2023 0722 by Hoover Brunette, RN Outcome: Progressing 10/07/2023 0721 by Hoover Brunette, RN Outcome: Progressing 10/07/2023 0721 by Hoover Brunette, RN Outcome: Progressing Goal: Will remain free from infection 10/07/2023 0722 by Hoover Brunette, RN Outcome: Progressing 10/07/2023 0721 by Hoover Brunette, RN Outcome: Progressing 10/07/2023 0721 by Hoover Brunette, RN Outcome: Progressing Goal: Diagnostic test results will improve 10/07/2023 0722 by Hoover Brunette, RN Outcome: Progressing 10/07/2023 0721 by Hoover Brunette, RN Outcome: Progressing 10/07/2023 0721 by Hoover Brunette, RN Outcome: Progressing Goal: Respiratory complications will improve 10/07/2023 0722 by Hoover Brunette, RN Outcome: Progressing 10/07/2023 0721 by Hoover Brunette, RN Outcome: Progressing 10/07/2023 0721 by Hoover Brunette, RN Outcome: Progressing Goal: Cardiovascular complication will be avoided 10/07/2023 0722 by Hoover Brunette, RN Outcome: Progressing 10/07/2023 0721 by Hoover Brunette, RN Outcome: Progressing 10/07/2023 0721 by Hoover Brunette,  RN Outcome: Progressing   Problem: Activity: Goal: Risk for activity intolerance will decrease 10/07/2023 0722 by Hoover Brunette, RN Outcome: Progressing 10/07/2023 0721 by Hoover Brunette, RN Outcome: Progressing 10/07/2023 0721 by Hoover Brunette, RN Outcome: Progressing   Problem: Nutrition: Goal: Adequate nutrition will be maintained 10/07/2023 0722 by Hoover Brunette, RN Outcome: Progressing 10/07/2023 0721 by Hoover Brunette, RN Outcome: Progressing 10/07/2023 0721 by Hoover Brunette, RN Outcome: Progressing   Problem: Coping: Goal: Level of anxiety will decrease 10/07/2023 0722 by Hoover Brunette, RN Outcome: Progressing 10/07/2023 0721 by Hoover Brunette, RN Outcome: Progressing 10/07/2023 0721 by Hoover Brunette, RN Outcome: Progressing   Problem: Elimination: Goal: Will not experience complications related to bowel motility 10/07/2023 0722 by Hoover Brunette, RN Outcome: Progressing 10/07/2023 0721 by Hoover Brunette, RN Outcome: Progressing 10/07/2023 0721 by Hoover Brunette, RN Outcome: Progressing Goal: Will not experience complications related to urinary retention 10/07/2023 0722 by Hoover Brunette, RN Outcome: Progressing 10/07/2023 0721 by Hoover Brunette, RN Outcome: Progressing 10/07/2023 0721 by Hoover Brunette, RN Outcome: Progressing   Problem: Pain Management: Goal: General experience of comfort will improve 10/07/2023 0722 by Hoover Brunette, RN Outcome: Progressing 10/07/2023 0721 by Hoover Brunette, RN Outcome: Progressing 10/07/2023 0721 by Hoover Brunette, RN Outcome: Progressing   Problem: Safety: Goal: Ability to remain free from injury will improve 10/07/2023 0722 by Hoover Brunette, RN Outcome: Progressing 10/07/2023 0721 by Hoover Brunette, RN Outcome: Progressing 10/07/2023 0721 by Hoover Brunette, RN Outcome: Progressing   Problem: Skin Integrity: Goal:  Risk for impaired skin integrity will decrease 10/07/2023 0722 by Hoover Brunette, RN Outcome: Progressing 10/07/2023 0721 by Hoover Brunette, RN Outcome: Progressing 10/07/2023 0721 by Hoover Brunette, RN Outcome: Progressing

## 2023-10-07 NOTE — Progress Notes (Signed)
Notified MD @ 0825 that patients left pupil was non reactive to light and 5mm, right pupil 2mm and reactive. MD placed orders for STAT CT Head and en route to bedside.   Changed plan to Code Stroke after MD assessment around 0850. Rapid Response RN at bedside with Neuro tele Cart.

## 2023-10-07 NOTE — Progress Notes (Signed)
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 aggressive finding Other neck: No acute finding. Upper chest: Enlarged lymph nodes in the upper mediastinum with layering pleural effusions. Recent chest radiograph  and chest CT 07/03/2023. Review of the MIP images confirms the above findings CTA HEAD FINDINGS Anterior circulation: Atheromatous calcification of the cavernous carotids. No branch occlusion, beading, or aneurysm. Posterior circulation: Atheromatous calcification on the V4 segments without significant stenosis. The basilar is smoothly contoured and diffusely patent. No branch occlusion, beading, or aneurysm. Venous sinuses: Unremarkable Anatomic variants: None significant Review of the MIP  images confirms the above findings IMPRESSION: 1. No emergent finding. 2. Mild atherosclerosis. No significant stenosis or irregularity of major arteries in the head and neck. Electronically Signed   By: Tiburcio Pea M.D.   On: 10/07/2023 10:38   CT HEAD CODE STROKE WO CONTRAST` Addendum Date: 10/07/2023 ADDENDUM REPORT: 10/07/2023 10:32 ADDENDUM: These results were called by telephone interpretation on 10/07/2023 at 10:32 am to provider [facility] , who verbally acknowledged these results. Electronically Signed   By: Jackey Loge D.O.   On: 10/07/2023 10:32   Result Date: 10/07/2023 CLINICAL DATA:  Code stroke.  Mental status change, unknown cause. EXAM: CT HEAD WITHOUT CONTRAST TECHNIQUE: Contiguous axial images were obtained from the base of the skull through the vertex without intravenous contrast. RADIATION DOSE REDUCTION: This exam was performed according to the departmental dose-optimization program which includes automated exposure control, adjustment of the mA and/or kV according to patient size and/or use of iterative reconstruction technique. COMPARISON:  Brain MRI 07/04/2023.  Head CT 07/04/2023. FINDINGS: Brain: Generalized cerebral atrophy. Focus of chronic cortical encephalomalacia/gliosis again noted within the lateral left upper lobe. This may reflect a chronic infarct or may be posttraumatic in etiology. There is no acute intracranial hemorrhage. No acute demarcated cortical infarct. No extra-axial fluid collection. No evidence of an intracranial mass. No midline shift. Vascular: No hyperdense vessel.  Atherosclerotic calcifications. Skull: No calvarial fracture or aggressive osseous lesion. Sinuses/Orbits: No mass or acute finding within the imaged orbits. Other: Mild mucosal thickening within the right maxillary sinus. Small fluid levels within the sphenoid sinuses. Mild mucosal thickening within the bilateral ethmoid sinuses. Small to moderate size fluid level, and mild background  mucosal thickening, within the right frontal sinus. ASPECTS [facility] Stroke Program Early CT Score) - Ganglionic level infarction (caudate, lentiform nuclei, internal capsule, insula, M1-M3 cortex): 7 - Supraganglionic infarction (M4-M6 cortex): 3 Total score (0-10 with 10 being normal): 10 (when discounting chronic encephalomalacia/gliosis in the left temporal lobe). Attempts are being made to reach the ordering provider at this time. IMPRESSION: 1. No evidence of an acute intracranial abnormality. 2. Redemonstrated focus of chronic encephalomalacia/gliosis within the lateral left temporal lobe, which may be post-traumatic in etiology or may reflect a chronic infarct. 3. Generalized cerebral atrophy. 4. Paranasal sinus disease as described. Electronically Signed: By: Jackey Loge D.O. On: 10/07/2023 10:21   ECHOCARDIOGRAM COMPLETE Result Date: 10/06/2023    ECHOCARDIOGRAM REPORT   Patient Name:   EMIDIO REAM Date of Exam: 10/06/2023 Medical Rec #:  [id number]      Height:       69.0 in Accession #:    [id number]     Weight:       169.0 lb Date of Birth:  07/19/1956      BSA:          1.923 m Patient Age:    67 years       BP:           138/104 mmHg Patient Gender: M              HR:  84 bpm. Exam Location:  Inpatient Procedure: 2D Echo, Color Doppler, Cardiac Doppler and Saline Contrast Bubble            Study Indications:    CHF I50.21  History:        Patient has no prior history of Echocardiogram examinations.  Sonographer:    Harriette Bouillon RDCS Referring Phys: [id number] MIR M [facility] IMPRESSIONS  1. Left ventricular ejection fraction, by estimation, is 35 to 40%. The left ventricle has moderately decreased function. The left ventricle has no regional wall motion abnormalities. There is moderate left ventricular hypertrophy. Left ventricular diastolic parameters are indeterminate.  2. Right ventricular systolic function is moderately reduced. The right ventricular size is mildly enlarged.  Tricuspid regurgitation signal is inadequate for assessing PA pressure.  3. Left atrial size was moderately dilated.  4. Right atrial size was mildly dilated.  5. The mitral valve is degenerative. Mild to moderate mitral valve regurgitation. No evidence of mitral stenosis.  6. The aortic valve is tricuspid. There is mild calcification of the aortic valve. Aortic valve regurgitation is not visualized. No aortic stenosis is present.  7. The inferior vena cava is dilated in size with <50% respiratory variability, suggesting right atrial pressure of 15 mmHg. FINDINGS  Left Ventricle: Left ventricular ejection fraction, by estimation, is 35 to 40%. The left ventricle has moderately decreased function. The left ventricle has no regional wall motion abnormalities. The left ventricular internal cavity size was normal in size. There is moderate left ventricular hypertrophy. Left ventricular diastolic parameters are indeterminate. Right Ventricle: The right ventricular size is mildly enlarged. No increase in right ventricular wall thickness. Right ventricular systolic function is moderately reduced. Tricuspid regurgitation signal is inadequate for assessing PA pressure. Left Atrium: Left atrial size was moderately dilated. Right Atrium: Right atrial size was mildly dilated. Pericardium: There is no evidence of pericardial effusion. Mitral Valve: The mitral valve is degenerative in appearance. Mild to moderate mitral annular calcification. Mild to moderate mitral valve regurgitation. No evidence of mitral valve stenosis. Tricuspid Valve: The tricuspid valve is normal in structure. Tricuspid valve regurgitation is mild . No evidence of tricuspid stenosis. Aortic Valve: The aortic valve is tricuspid. There is mild calcification of the aortic valve. Aortic valve regurgitation is not visualized. No aortic stenosis is present. Pulmonic Valve: The pulmonic valve was not well visualized. Pulmonic valve regurgitation is trivial. No  evidence of pulmonic stenosis. Aorta: The aortic root is normal in size and structure. Venous: The inferior vena cava is dilated in size with less than 50% respiratory variability, suggesting right atrial pressure of 15 mmHg. IAS/Shunts: No atrial level shunt detected by color flow Doppler. Agitated saline contrast was given intravenously to evaluate for intracardiac shunting.  LEFT VENTRICLE PLAX 2D LVIDd:         4.30 cm LVIDs:         3.50 cm LV PW:         1.10 cm LV IVS:        1.00 cm LVOT diam:     2.30 cm LV SV:         65 LV SV Index:   34 LVOT Area:     4.15 cm  LV Volumes (MOD) LV vol d, MOD A2C: 136.0 ml LV vol d, MOD A4C: 103.0 ml LV vol s, MOD A2C: 73.3 ml LV vol s, MOD A4C: 74.3 ml LV SV MOD A2C:     62.7 ml LV SV MOD A4C:  103.0 ml LV SV MOD BP:      47.5 ml LEFT ATRIUM             Index        RIGHT ATRIUM           Index LA diam:        4.30 cm 2.24 cm/m   RA Area:     29.30 cm LA Vol (A2C):   74.0 ml 38.48 ml/m  RA Volume:   115.00 ml 59.79 ml/m LA Vol (A4C):   87.7 ml 45.60 ml/m LA Biplane Vol: 83.4 ml 43.36 ml/m  AORTIC VALVE LVOT Vmax:   80.20 cm/s LVOT Vmean:  56.900 cm/s LVOT VTI:    0.156 m  AORTA Ao Root diam: 3.10 cm Ao Asc diam:  3.40 cm  SHUNTS Systemic VTI:  0.16 m Systemic Diam: 2.30 cm Weston Brass MD Electronically signed by Weston Brass MD Signature Date/Time: 10/06/2023/10:27:06 PM    Final    Scheduled Meds:  apixaban  5 mg Oral BID   Chlorhexidine Gluconate Cloth  6 each Topical Daily   folic acid  1 mg Oral Daily   furosemide  40 mg Intravenous BID   gabapentin  300 mg Oral TID   magnesium oxide  400 mg Oral Daily   metoprolol tartrate  5 mg Intravenous Q6H   mirtazapine  15 mg Oral QHS   multivitamin with minerals  1 tablet Oral Daily   mupirocin ointment  1 Application Nasal BID   pantoprazole (PROTONIX) IV  40 mg Intravenous Q24H   [START ON 10/15/2023] thiamine (VITAMIN B1) injection  100 mg Intravenous Daily   Continuous Infusions:   dexmedetomidine (PRECEDEX) IV infusion 0.8 mcg/kg/hr (10/07/23 1841)   thiamine (VITAMIN B1) injection Stopped (10/07/23 1131)   Followed by   Melene Muller ON 10/09/2023] thiamine (VITAMIN B1) injection      LOS: 2 days   Marguerita Merles, DO [facility] Available via Epic secure chat 7am-7pm After these hours, please refer to coverage provider listed on [email or web address] 10/07/2023, 7:15 PM

## 2023-10-07 NOTE — Progress Notes (Signed)
SLP Cancellation Note  Patient Details Name: [name] MRN: [id number] DOB: Oct 31, 1955   Cancelled evaluation:  Pt not yet appropriate for swallow eval after discussion with RN.  Will follow for readiness as MS allows.  [name] L. [name], MA CCC/SLP Clinical Specialist - Acute Care SLP Acute Rehabilitation Services Office number [phone]          [name] [name] 10/07/2023, 2:55 PM
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
## 2023-10-08 DIAGNOSIS — I4891 Unspecified atrial fibrillation: Secondary | ICD-10-CM | POA: Diagnosis not present

## 2023-10-08 DIAGNOSIS — F10931 Alcohol use, unspecified with withdrawal delirium: Secondary | ICD-10-CM

## 2023-10-08 DIAGNOSIS — I5041 Acute combined systolic (congestive) and diastolic (congestive) heart failure: Secondary | ICD-10-CM | POA: Diagnosis not present

## 2023-10-08 LAB — CBC WITH DIFFERENTIAL/PLATELET
Abs Immature Granulocytes: 0.02 10*3/uL (ref 0.00–0.07)
Basophils Absolute: 0 10*3/uL (ref 0.0–0.1)
Basophils Relative: 1 %
Eosinophils Absolute: 0.1 10*3/uL (ref 0.0–0.5)
Eosinophils Relative: 2 %
HCT: 39.8 % (ref 39.0–52.0)
Hemoglobin: 12.3 g/dL — ABNORMAL LOW (ref 13.0–17.0)
Immature Granulocytes: 0 %
Lymphocytes Relative: 21 %
Lymphs Abs: 1 10*3/uL (ref 0.7–4.0)
MCH: 25 pg — ABNORMAL LOW (ref 26.0–34.0)
MCHC: 30.9 g/dL (ref 30.0–36.0)
MCV: 80.9 fL (ref 80.0–100.0)
Monocytes Absolute: 0.6 10*3/uL (ref 0.1–1.0)
Monocytes Relative: 12 %
Neutro Abs: 3.1 10*3/uL (ref 1.7–7.7)
Neutrophils Relative %: 64 %
Platelets: 127 10*3/uL — ABNORMAL LOW (ref 150–400)
RBC: 4.92 MIL/uL (ref 4.22–5.81)
RDW: 15.7 % — ABNORMAL HIGH (ref 11.5–15.5)
WBC: 4.8 10*3/uL (ref 4.0–10.5)
nRBC: 0 % (ref 0.0–0.2)

## 2023-10-08 LAB — FOLATE: Folate: 19.1 ng/mL (ref >5.9)

## 2023-10-08 LAB — AMMONIA: Ammonia: 21 umol/L (ref 9–35)

## 2023-10-08 LAB — COMPREHENSIVE METABOLIC PANEL
ALT: 23 U/L (ref 0–44)
AST: 30 U/L (ref 15–41)
Albumin: 3.1 g/dL — ABNORMAL LOW (ref 3.5–5.0)
Alkaline Phosphatase: 75 U/L (ref 38–126)
Anion gap: 12 (ref 5–15)
BUN: 14 mg/dL (ref 8–23)
CO2: 28 mmol/L (ref 22–32)
Calcium: 8.7 mg/dL — ABNORMAL LOW (ref 8.9–10.3)
Chloride: 97 mmol/L — ABNORMAL LOW (ref 98–111)
Creatinine, Ser: 1.11 mg/dL (ref 0.61–1.24)
GFR, Estimated: >60 mL/min (ref >60)
Glucose, Bld: 96 mg/dL (ref 70–99)
Potassium: 3.3 mmol/L — ABNORMAL LOW (ref 3.5–5.1)
Sodium: 137 mmol/L (ref 135–145)
Total Bilirubin: 1.9 mg/dL — ABNORMAL HIGH (ref <1.2)
Total Protein: 7.5 g/dL (ref 6.5–8.1)

## 2023-10-08 LAB — RETICULOCYTES
Immature Retic Fract: 14.6 % (ref 2.3–15.9)
RBC.: 4.83 MIL/uL (ref 4.22–5.81)
Retic Count, Absolute: 66.7 10*3/uL (ref 19.0–186.0)
Retic Ct Pct: 1.4 % (ref 0.4–3.1)

## 2023-10-08 LAB — MAGNESIUM: Magnesium: 2 mg/dL (ref 1.7–2.4)

## 2023-10-08 LAB — IRON AND TIBC
Iron: 35 ug/dL — ABNORMAL LOW (ref 45–182)
Saturation Ratios: 7 % — ABNORMAL LOW (ref 17.9–39.5)
TIBC: 475 ug/dL — ABNORMAL HIGH (ref 250–450)
UIBC: 440 ug/dL

## 2023-10-08 LAB — PHOSPHORUS: Phosphorus: 4.3 mg/dL (ref 2.5–4.6)

## 2023-10-08 LAB — FERRITIN: Ferritin: 14 ng/mL — ABNORMAL LOW (ref 24–336)

## 2023-10-08 LAB — VITAMIN B12: Vitamin B-12: 384 pg/mL (ref 180–914)

## 2023-10-08 MED ORDER — METOPROLOL TARTRATE 5 MG/5ML IV SOLN
2.5000 mg | Freq: Once | INTRAVENOUS | Status: AC
  Administered 2023-10-08: 2.5 mg via INTRAVENOUS
  Filled 2023-10-08: qty 5

## 2023-10-08 MED ORDER — POTASSIUM CHLORIDE 10 MEQ/100ML IV SOLN
10.0000 meq | INTRAVENOUS | Status: AC
  Administered 2023-10-08 (×4): 10 meq via INTRAVENOUS
  Filled 2023-10-08 (×4): qty 100

## 2023-10-08 MED ORDER — LORAZEPAM 2 MG/ML IJ SOLN
1.0000 mg | INTRAMUSCULAR | Status: DC | PRN
  Administered 2023-10-09 (×2): 2 mg via INTRAVENOUS
  Administered 2023-10-09: 4 mg via INTRAVENOUS
  Administered 2023-10-09: 2 mg via INTRAVENOUS
  Administered 2023-10-09 (×2): 4 mg via INTRAVENOUS
  Administered 2023-10-09: 1 mg via INTRAVENOUS
  Administered 2023-10-09: 2 mg via INTRAVENOUS
  Administered 2023-10-09 – 2023-10-10 (×3): 4 mg via INTRAVENOUS
  Administered 2023-10-10: 3 mg via INTRAVENOUS
  Filled 2023-10-08 (×2): qty 2
  Filled 2023-10-08 (×2): qty 1
  Filled 2023-10-08 (×3): qty 2
  Filled 2023-10-08 (×2): qty 1
  Filled 2023-10-08 (×2): qty 2
  Filled 2023-10-08: qty 1

## 2023-10-08 MED ORDER — LORAZEPAM 1 MG PO TABS
1.0000 mg | ORAL_TABLET | ORAL | Status: DC | PRN
  Administered 2023-10-08 – 2023-10-09 (×2): 1 mg via ORAL
  Filled 2023-10-08 (×2): qty 1

## 2023-10-08 MED ORDER — FUROSEMIDE 10 MG/ML IJ SOLN
40.0000 mg | Freq: Every day | INTRAMUSCULAR | Status: DC
  Administered 2023-10-08: 40 mg via INTRAVENOUS

## 2023-10-08 MED ORDER — FUROSEMIDE 10 MG/ML IJ SOLN: 40.0000 mg | Freq: Every day | INTRAMUSCULAR | Status: DC

## 2023-10-08 NOTE — Plan of Care (Signed)
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
## 2023-10-08 NOTE — Progress Notes (Signed)
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 arch: Not covered Right carotid system: Motion artifact. Common carotid origin is not covered. Mild atheromatous plaque at the bifurcation without stenosis or ulceration Left carotid system: Calcified plaque at the bifurcation. No stenosis or ulceration. Common carotid origin is not covered. Vertebral arteries: The left subclavian origin is not covered. Calcified plaque at the right vertebral origin without flow reducing stenosis. No vertebral beading or dissection. Skeleton: No acute or aggressive finding Other neck: No acute finding. Upper chest: Enlarged lymph nodes in the upper mediastinum with layering pleural effusions. Recent chest radiograph and chest CT  07/03/2023. Review of the MIP images confirms the above findings CTA HEAD FINDINGS Anterior circulation: Atheromatous calcification of the cavernous carotids. No branch occlusion, beading, or aneurysm. Posterior circulation: Atheromatous calcification on the V4 segments without significant stenosis. The basilar is smoothly contoured and diffusely patent. No branch occlusion, beading, or aneurysm. Venous sinuses: Unremarkable Anatomic variants: None significant Review of the MIP images confirms the above findings IMPRESSION: 1. No emergent finding. 2. Mild atherosclerosis. No significant stenosis or irregularity of major arteries in the head and neck. Electronically Signed   By: Tiburcio Pea M.D.   On: 10/07/2023 10:38   CT HEAD CODE STROKE WO CONTRAST` Addendum Date: 10/07/2023 ADDENDUM REPORT: 10/07/2023 10:32 ADDENDUM: These results were called by telephone interpretation on 10/07/2023 at 10:32 am to provider [facility] , who verbally acknowledged these results. Electronically Signed   By: Jackey Loge D.O.   On: 10/07/2023 10:32   Result Date: 10/07/2023 CLINICAL DATA:  Code stroke.  Mental status change, unknown cause. EXAM: CT HEAD WITHOUT CONTRAST TECHNIQUE: Contiguous axial images were obtained from the base of the skull through the vertex without intravenous contrast. RADIATION DOSE REDUCTION: This exam was performed according to the departmental dose-optimization program which includes automated exposure control, adjustment of the mA and/or kV according to patient size and/or use of iterative reconstruction technique. COMPARISON:  Brain MRI 07/04/2023.  Head CT 07/04/2023. FINDINGS: Brain: Generalized cerebral atrophy. Focus of chronic cortical encephalomalacia/gliosis again noted within the lateral left upper lobe. This may reflect a chronic infarct or may be posttraumatic in etiology. There is no acute intracranial hemorrhage. No acute demarcated cortical infarct. No extra-axial fluid  collection. No evidence of an intracranial mass. No midline shift. Vascular: No hyperdense vessel.  Atherosclerotic calcifications. Skull: No calvarial fracture or aggressive osseous lesion. Sinuses/Orbits: No mass or acute finding within the imaged orbits. Other: Mild mucosal thickening within the right maxillary sinus. Small fluid levels within the sphenoid sinuses. Mild mucosal thickening within the bilateral ethmoid sinuses. Small to moderate size fluid level, and mild background mucosal thickening, within the right frontal sinus. ASPECTS [facility] Stroke Program Early CT Score) - Ganglionic level infarction (caudate, lentiform nuclei, internal capsule, insula, M1-M3 cortex): 7 - Supraganglionic infarction (M4-M6 cortex): 3 Total score (0-10 with 10 being normal): 10 (when discounting chronic encephalomalacia/gliosis in the left temporal lobe). Attempts are being made to reach the ordering provider at this time. IMPRESSION: 1. No evidence of an acute intracranial abnormality. 2. Redemonstrated focus of chronic encephalomalacia/gliosis within the lateral left temporal lobe, which may be post-traumatic in etiology or may reflect a chronic infarct. 3. Generalized cerebral atrophy. 4. Paranasal sinus disease as described. Electronically Signed: By: Jackey Loge D.O. On: 10/07/2023 10:21   Scheduled Meds:  apixaban  5 mg Oral BID   Chlorhexidine Gluconate Cloth  6 each Topical Daily  folic acid  1 mg Oral Daily   furosemide  40 mg Intravenous Daily   gabapentin  300 mg Oral TID   magnesium oxide  400 mg Oral Daily   metoprolol tartrate  5 mg Intravenous Q6H   mirtazapine  15 mg Oral QHS   multivitamin with minerals  1 tablet Oral Daily   mupirocin ointment  1 Application Nasal BID   pantoprazole (PROTONIX) IV  40 mg Intravenous Q24H   [START ON 10/15/2023] thiamine (VITAMIN B1) injection  100 mg Intravenous Daily   Continuous Infusions:  dexmedetomidine (PRECEDEX) IV infusion Stopped (10/08/23 1303)    thiamine (VITAMIN B1) injection Stopped (10/08/23 1623)   Followed by   Melene Muller ON 10/09/2023] thiamine (VITAMIN B1) injection      LOS: 3 days   Marguerita Merles, DO [facility] Available via Epic secure chat 7am-7pm After these hours, please refer to coverage provider listed on [email or web address] 10/08/2023, 7:59 PM
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
## 2023-10-09 ENCOUNTER — Inpatient Hospital Stay (HOSPITAL_COMMUNITY): Payer: Medicare HMO

## 2023-10-09 DIAGNOSIS — I4891 Unspecified atrial fibrillation: Secondary | ICD-10-CM | POA: Diagnosis not present

## 2023-10-09 DIAGNOSIS — I5041 Acute combined systolic (congestive) and diastolic (congestive) heart failure: Secondary | ICD-10-CM | POA: Diagnosis not present

## 2023-10-09 DIAGNOSIS — F10931 Alcohol use, unspecified with withdrawal delirium: Secondary | ICD-10-CM | POA: Diagnosis not present

## 2023-10-09 LAB — PHOSPHORUS: Phosphorus: 4.4 mg/dL (ref 2.5–4.6)

## 2023-10-09 LAB — CBC WITH DIFFERENTIAL/PLATELET
Abs Immature Granulocytes: 0.03 10*3/uL (ref 0.00–0.07)
Basophils Absolute: 0.1 10*3/uL (ref 0.0–0.1)
Basophils Relative: 1 %
Eosinophils Absolute: 0.1 10*3/uL (ref 0.0–0.5)
Eosinophils Relative: 2 %
HCT: 38.3 % — ABNORMAL LOW (ref 39.0–52.0)
Hemoglobin: 12 g/dL — ABNORMAL LOW (ref 13.0–17.0)
Immature Granulocytes: 0 %
Lymphocytes Relative: 24 %
Lymphs Abs: 1.7 10*3/uL (ref 0.7–4.0)
MCH: 25.2 pg — ABNORMAL LOW (ref 26.0–34.0)
MCHC: 31.3 g/dL (ref 30.0–36.0)
MCV: 80.5 fL (ref 80.0–100.0)
Monocytes Absolute: 1 10*3/uL (ref 0.1–1.0)
Monocytes Relative: 13 %
Neutro Abs: 4.5 10*3/uL (ref 1.7–7.7)
Neutrophils Relative %: 60 %
Platelets: 165 10*3/uL (ref 150–400)
RBC: 4.76 MIL/uL (ref 4.22–5.81)
RDW: 15.8 % — ABNORMAL HIGH (ref 11.5–15.5)
WBC: 7.4 10*3/uL (ref 4.0–10.5)
nRBC: 0 % (ref 0.0–0.2)

## 2023-10-09 LAB — COMPREHENSIVE METABOLIC PANEL
ALT: 19 U/L (ref 0–44)
AST: 29 U/L (ref 15–41)
Albumin: 3.1 g/dL — ABNORMAL LOW (ref 3.5–5.0)
Alkaline Phosphatase: 71 U/L (ref 38–126)
Anion gap: 10 (ref 5–15)
BUN: 21 mg/dL (ref 8–23)
CO2: 27 mmol/L (ref 22–32)
Calcium: 8.6 mg/dL — ABNORMAL LOW (ref 8.9–10.3)
Chloride: 101 mmol/L (ref 98–111)
Creatinine, Ser: 1.27 mg/dL — ABNORMAL HIGH (ref 0.61–1.24)
GFR, Estimated: >60 mL/min (ref >60)
Glucose, Bld: 134 mg/dL — ABNORMAL HIGH (ref 70–99)
Potassium: 3.3 mmol/L — ABNORMAL LOW (ref 3.5–5.1)
Sodium: 138 mmol/L (ref 135–145)
Total Bilirubin: 1.7 mg/dL — ABNORMAL HIGH (ref <1.2)
Total Protein: 7 g/dL (ref 6.5–8.1)

## 2023-10-09 LAB — MAGNESIUM: Magnesium: 2.2 mg/dL (ref 1.7–2.4)

## 2023-10-09 MED ORDER — POTASSIUM CHLORIDE CRYS ER 20 MEQ PO TBCR
40.0000 meq | EXTENDED_RELEASE_TABLET | ORAL | Status: AC
  Administered 2023-10-09 (×2): 40 meq via ORAL
  Filled 2023-10-09 (×2): qty 2

## 2023-10-09 MED ORDER — AMIODARONE HCL IN DEXTROSE 360-4.14 MG/200ML-% IV SOLN
60.0000 mg/h | INTRAVENOUS | Status: AC
  Administered 2023-10-09 (×2): 60 mg/h via INTRAVENOUS
  Filled 2023-10-09 (×2): qty 200

## 2023-10-09 MED ORDER — AMIODARONE HCL IN DEXTROSE 360-4.14 MG/200ML-% IV SOLN
30.0000 mg/h | INTRAVENOUS | Status: DC
  Administered 2023-10-09 – 2023-10-11 (×6): 30 mg/h via INTRAVENOUS
  Filled 2023-10-09 (×6): qty 200

## 2023-10-09 MED ORDER — AMIODARONE LOAD VIA INFUSION
150.0000 mg | Freq: Once | INTRAVENOUS | Status: AC
  Administered 2023-10-09: 150 mg via INTRAVENOUS
  Filled 2023-10-09: qty 83.34
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
## 2023-10-09 NOTE — Plan of Care (Signed)
  Problem: Education: Goal: Knowledge of General Education information will improve Description: Including pain rating scale, medication(s)/side effects and non-pharmacologic comfort measures Outcome: Progressing   Problem: Nutrition: Goal: Adequate nutrition will be maintained Outcome: Progressing   Problem: Coping: Goal: Level of anxiety will decrease Outcome: Progressing   Problem: Pain Management: Goal: General experience of comfort will improve Outcome: Progressing   Problem: Clinical Measurements: Goal: Ability to maintain clinical measurements within normal limits will improve Outcome: Not Progressing Goal: Respiratory complications will improve Outcome: Not Progressing Goal: Cardiovascular complication will be avoided Outcome: Not Progressing   Problem: Elimination: Goal: Will not experience complications related to bowel motility Outcome: Not Progressing   Problem: Safety: Goal: Ability to remain free from injury will improve Outcome: Not Progressing

## 2023-10-09 NOTE — Progress Notes (Signed)
   NAME:  [name], MRN:  [id number], DOB:  06/09/1956, LOS: 4 ADMISSION DATE:  10/05/2023, CONSULTATION DATE:  10/09/2023  REFERRING MD:  Marland Mcalpine, CHIEF COMPLAINT: Alcohol withdrawal  History of Present Illness:  67 year old man with hep C/EtOH cirrhosis, homelessness admitted with shortness of breath lower extremity edema and dyspnea.  He was found to have new onset atrial fibrillation with RVR.  Echo showed EF 35 to 40% with RV dysfunction.  He was diuresed with Lasix, rate controlled with Cardizem drip and started on Eliquis. 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  PCCM consulted due to persistent Precedex requirement   Pertinent  Medical History  hypertension,  hepatitis C with cirrhosis,  polysubstance abuse (crack cocaine and alcohol abuse),  Major depression with  multiple prior suicide attempts with self-inflicted stab wounds to the abdomen  Thrombocytopenia  Significant Hospital Events: Including procedures, antibiotic start and stop dates in addition to other pertinent events   12/21 10 mg Ativan required during dayshift 12/22 Needing precedex during day, weaned off  12/23 Afib RVR, off precedex, mentation/agitation improved   Interim History / Subjective:  Patient following commands, remains off precedex    Objective   Blood pressure (!) 143/114, pulse (!) 48, temperature 99.1 F (37.3 C), resp. rate (!) 26, height 5\' 9"  (1.753 m), weight 90.8 kg, SpO2 100%.        Intake/Output Summary (Last 24 hours) at 10/09/2023 1106 Last data filed at 10/09/2023 0600 Gross per 24 hour  Intake 586.32 ml  Output 1216 ml  Net -629.68 ml   Filed Weights   10/05/23  1032 10/09/23 0400  Weight: 76.7 kg 90.8 kg    Examination: General: disheveled older adult male, lying on ICU bed   HENT: Normocephalic, left surgical pupil 3mm non reactive to light, poor dentition  Lungs: clear breath sounds, no respiratory distress  Cardiovascular: s1,s2, irregular, Afib RVR, no JVD, no MRG  Abdomen: BS active, soft  Extremities: non pitting edema, multiple tatoos  Neuro: AOx3, follows commands    Resolved Hospital Problem list     Assessment & Plan:  EtOH withdrawal delirium  ETOH abuse and hep C cirrhosis hx Required ativan and precedex  Off precedex  P: Continue to follow LFTs intermittently  Continue folic acid and thiamine  Precedex dc'd, PCCM will sign off   Acute systolic heart failure New onset A-fib with RVR P: Cardiology following, continue to follow recs for rate control Continue Eliquis  Continue Lasix and spironolactone   Hypokalemia: P:  Replace K Continue daily BMETs   MDD with past hx of suicide attempts  No suicidal ideation reported during this admission  P:  Continue mirtazapine    Best Practice (right click and "Reselect all SmartList Selections" daily)   Diet/type: Regular consistency (see orders) DVT prophylaxis DOAC Pressure ulcer(s): N/A GI prophylaxis: N/A Lines: N/A Foley:  N/A Code Status:  full code Last date of multidisciplinary goals of care discussion [per [facility] ]

## 2023-10-09 NOTE — Progress Notes (Signed)
Rounding Note    Patient Name: [name] Date of Encounter: 10/09/2023  [facility] Cardiologist: None   Subjective   He is currently still altered although he can ask questions.  He has mittens on. He has had a CT recently with no acute stroke.  net -2.5 L today; total net -13 L.  His heart rates still remain pretty elevated up to the 150s   Inpatient Medications    Scheduled Meds:  apixaban  5 mg Oral BID   Chlorhexidine Gluconate Cloth  6 each Topical Daily   folic acid  1 mg Oral Daily   gabapentin  300 mg Oral TID   magnesium oxide  400 mg Oral Daily   metoprolol tartrate  5 mg Intravenous Q6H   mirtazapine  15 mg Oral QHS   multivitamin with minerals  1 tablet Oral Daily   mupirocin ointment  1 Application Nasal BID   pantoprazole (PROTONIX) IV  40 mg Intravenous Q24H   potassium chloride  40 mEq Oral Q4H   [START ON 10/15/2023] thiamine (VITAMIN B1) injection  100 mg Intravenous Daily   Continuous Infusions:  dexmedetomidine (PRECEDEX) IV infusion Stopped (10/08/23 1303)   thiamine (VITAMIN B1) injection     PRN Meds: albuterol, camphor-menthol, hydrALAZINE, ibuprofen, LORazepam **OR** LORazepam, ondansetron **OR** ondansetron (ZOFRAN) IV, mouth rinse, polyvinyl alcohol, traZODone   Vital Signs    Vitals:   10/09/23 0700 10/09/23 0751 10/09/23 0800 10/09/23 0900  BP: (!) 86/73 94/74 (!) 129/95 (!) 143/114  Pulse: (!) 116 (!) 134 77 (!) 48  Resp: (!) 35  18 (!) 26  Temp: 99.1 F (37.3 C)  99.1 F (37.3 C) 99.1 F (37.3 C)  TempSrc:   Bladder   SpO2: 99%  100% 100%  Weight:      Height:        Intake/Output Summary (Last 24 hours) at 10/09/2023 0939 Last data filed at 10/09/2023 0600 Gross per 24 hour  Intake 707.23 ml  Output 2566 ml  Net -1858.77 ml      10/09/2023    4:00 AM 10/05/2023   10:32 AM 07/19/2023    9:39 PM  Last 3 Weights  Weight (lbs) 200 lb 2.8 oz 169 lb 169 lb 12.1 oz  Weight (kg) 90.8 kg 76.658 kg 77 kg       Telemetry    Atrial fibrillation, rates up to the 150s- Personally Reviewed  ECG    No new ECG- Personally Reviewed  Physical Exam   GEN: Somnolent, but arousable Neck: + JVD Cardiac: Irregular, normal rate, no murmurs Respiratory:Rhonchi GI: Soft, nontender MS: trace edema Neuro: Not answering questions or following commands Psych: Unable to assess  Labs    High Sensitivity Troponin:   Recent Labs  Lab 10/05/23 1100 10/05/23 1654  TROPONINIHS 7 8     Chemistry Recent Labs  Lab 10/07/23 0314 10/08/23 0316 10/09/23 0318  NA 139 137 138  K 3.8 3.3* 3.3*  CL 104 97* 101  CO2 26 28 27   GLUCOSE 104* 96 134*  BUN 14 14 21   CREATININE 0.91 1.11 1.27*  CALCIUM 8.6* 8.7* 8.6*  MG 2.0 2.0 2.2  PROT 6.8 7.5 7.0  ALBUMIN 2.9* 3.1* 3.1*  AST 29 30 29   ALT 22 23 19   ALKPHOS 64 75 71  BILITOT 1.5* 1.9* 1.7*  GFRNONAA >60 >60 >60  ANIONGAP 9 12 10     Lipids No results for input(s): "CHOL", "TRIG", "HDL", "LABVLDL", "LDLCALC", "CHOLHDL" in  the last 168 hours.  Hematology Recent Labs  Lab 10/07/23 0314 10/08/23 0316 10/09/23 0318  WBC 3.8* 4.8 7.4  RBC 4.34 4.92  4.83 4.76  HGB 11.0* 12.3* 12.0*  HCT 36.9* 39.8 38.3*  MCV 85.0 80.9 80.5  MCH 25.3* 25.0* 25.2*  MCHC 29.8* 30.9 31.3  RDW 15.9* 15.7* 15.8*  PLT 112* 127* 165   Thyroid No results for input(s): "TSH", "FREET4" in the last 168 hours.  BNP Recent Labs  Lab 10/05/23 1100  BNP 157.8*    DDimer No results for input(s): "DDIMER" in the last 168 hours.   Radiology    MR BRAIN WO CONTRAST Result Date: 10/07/2023 CLINICAL DATA:  Provided history: Neuro deficit, acute, stroke suspected. EXAM: MRI HEAD WITHOUT CONTRAST TECHNIQUE: Multiplanar, multiecho pulse sequences of the brain and surrounding structures were obtained without intravenous contrast. COMPARISON:  Brain MRI 07/04/2023. Non-contrast head CT performed earlier today 10/07/2023. FINDINGS: Intermittently motion degraded examination  (with up to moderate motion degradation of the acquired sequences). Brain: Generalized cerebral atrophy. Redemonstrated focus of chronic encephalomalacia/gliosis within the lateral left temporal lobe, which may be post-traumatic in etiology or may reflect a chronic infarct. Mild multifocal T2 FLAIR hyperintense signal abnormality elsewhere within the cerebral white matter, nonspecific but compatible with chronic small vessel ischemic disease. Chronic microhemorrhage within the left cerebellar hemisphere. There is no acute infarct. No evidence of an intracranial mass. No extra-axial fluid collection. No midline shift. Vascular: Maintained flow voids within the proximal large arterial vessels. Skull and upper cervical spine: No focal worrisome marrow lesion Sinuses/Orbits: No mass or acute finding within the imaged orbits. Prior left ocular lens replacement. Mild, diffuse paranasal sinus mucosal thickening. Other: Trace fluid within the bilateral mastoid air cells. IMPRESSION: 1. Intermittently motion degraded examination. 2. No evidence of an acute intracranial abnormality. The diffusion-weighted imaging is of good quality there is no evidence of an acute infarct. 3. Redemonstrated focus of chronic encephalomalacia/gliosis within the lateral left temporal lobe, which may the post-traumatic in etiology or may reflect a chronic infarct. 4. Mild chronic small vessel ischemic changes within the cerebral white matter. 5. Chronic microhemorrhage within the right cerebellar hemisphere. 6. Generalized cerebral atrophy. 7. Mild paranasal sinus mucosal thickening. Electronically Signed   By: Jackey Loge D.O.   On: 10/07/2023 10:44   CT ANGIO HEAD NECK W WO CM (CODE STROKE) Result Date: 10/07/2023 CLINICAL DATA:  Neuro deficit with acute stroke suspected EXAM: CT ANGIOGRAPHY HEAD AND NECK WITH AND WITHOUT CONTRAST TECHNIQUE: Multidetector CT imaging of the head and neck was performed using the standard protocol during  bolus administration of intravenous contrast. Multiplanar CT image reconstructions and MIPs were obtained to evaluate the vascular anatomy. Carotid stenosis measurements (when applicable) are obtained utilizing NASCET criteria, using the distal internal carotid diameter as the denominator. RADIATION DOSE REDUCTION: This exam was performed according to the departmental dose-optimization program which includes automated exposure control, adjustment of the mA and/or kV according to patient size and/or use of iterative reconstruction technique. CONTRAST:  OMNIPAQUE IOHEXOL 350 MG/ML SOLN COMPARISON:  Head CT from earlier today FINDINGS: CTA NECK FINDINGS Aortic arch: Not covered Right carotid system: Motion artifact. Common carotid origin is not covered. Mild atheromatous plaque at the bifurcation without stenosis or ulceration Left carotid system: Calcified plaque at the bifurcation. No stenosis or ulceration. Common carotid origin is not covered. Vertebral arteries: The left subclavian origin is not covered. Calcified plaque at the right vertebral origin without flow reducing stenosis. No vertebral  beading or dissection. Skeleton: No acute or aggressive finding Other neck: No acute finding. Upper chest: Enlarged lymph nodes in the upper mediastinum with layering pleural effusions. Recent chest radiograph and chest CT 07/03/2023. Review of the MIP images confirms the above findings CTA HEAD FINDINGS Anterior circulation: Atheromatous calcification of the cavernous carotids. No Alic Hilburn occlusion, beading, or aneurysm. Posterior circulation: Atheromatous calcification on the V4 segments without significant stenosis. The basilar is smoothly contoured and diffusely patent. No Ganesh Deeg occlusion, beading, or aneurysm. Venous sinuses: Unremarkable Anatomic variants: None significant Review of the MIP images confirms the above findings IMPRESSION: 1. No emergent finding. 2. Mild atherosclerosis. No significant stenosis or  irregularity of major arteries in the head and neck. Electronically Signed   By: Tiburcio Pea M.D.   On: 10/07/2023 10:38   CT HEAD CODE STROKE WO CONTRAST` Addendum Date: 10/07/2023 ADDENDUM REPORT: 10/07/2023 10:32 ADDENDUM: These results were called by telephone interpretation on 10/07/2023 at 10:32 am to provider [facility] , who verbally acknowledged these results. Electronically Signed   By: Jackey Loge D.O.   On: 10/07/2023 10:32   Result Date: 10/07/2023 CLINICAL DATA:  Code stroke.  Mental status change, unknown cause. EXAM: CT HEAD WITHOUT CONTRAST TECHNIQUE: Contiguous axial images were obtained from the base of the skull through the vertex without intravenous contrast. RADIATION DOSE REDUCTION: This exam was performed according to the departmental dose-optimization program which includes automated exposure control, adjustment of the mA and/or kV according to patient size and/or use of iterative reconstruction technique. COMPARISON:  Brain MRI 07/04/2023.  Head CT 07/04/2023. FINDINGS: Brain: Generalized cerebral atrophy. Focus of chronic cortical encephalomalacia/gliosis again noted within the lateral left upper lobe. This may reflect a chronic infarct or may be posttraumatic in etiology. There is no acute intracranial hemorrhage. No acute demarcated cortical infarct. No extra-axial fluid collection. No evidence of an intracranial mass. No midline shift. Vascular: No hyperdense vessel.  Atherosclerotic calcifications. Skull: No calvarial fracture or aggressive osseous lesion. Sinuses/Orbits: No mass or acute finding within the imaged orbits. Other: Mild mucosal thickening within the right maxillary sinus. Small fluid levels within the sphenoid sinuses. Mild mucosal thickening within the bilateral ethmoid sinuses. Small to moderate size fluid level, and mild background mucosal thickening, within the right frontal sinus. ASPECTS [facility] Stroke Program Early CT Score) - Ganglionic level  infarction (caudate, lentiform nuclei, internal capsule, insula, M1-M3 cortex): 7 - Supraganglionic infarction (M4-M6 cortex): 3 Total score (0-10 with 10 being normal): 10 (when discounting chronic encephalomalacia/gliosis in the left temporal lobe). Attempts are being made to reach the ordering provider at this time. IMPRESSION: 1. No evidence of an acute intracranial abnormality. 2. Redemonstrated focus of chronic encephalomalacia/gliosis within the lateral left temporal lobe, which may be post-traumatic in etiology or may reflect a chronic infarct. 3. Generalized cerebral atrophy. 4. Paranasal sinus disease as described. Electronically Signed: By: Jackey Loge D.O. On: 10/07/2023 10:21    Cardiac Studies   TTE 10/06/2023  1. Left ventricular ejection fraction, by estimation, is 35 to 40%. The left ventricle has moderately decreased function. The left ventricle has no regional wall motion abnormalities. There is moderate left ventricular hypertrophy. Left ventricular  diastolic parameters are indeterminate.   2. Right ventricular systolic function is moderately reduced. The right ventricular size is mildly enlarged. Tricuspid regurgitation signal is inadequate for assessing PA pressure.   3. Left atrial size was moderately dilated.   4. Right atrial size was mildly dilated.   5. The mitral valve is  degenerative. Mild to moderate mitral valve regurgitation. No evidence of mitral stenosis.   6. The aortic valve is tricuspid. There is mild calcification of the  aortic valve. Aortic valve regurgitation is not visualized. No aortic stenosis is present.   7. The inferior vena cava is dilated in size with <50% respiratory variability, suggesting right atrial pressure of 15 mmHg.   Patient Profile     67 y.o. male with a history of hypertension, hyperlipidemia, hepatitis C, hepatic cirrhosis, polysubstance abuse (crack cocaine and alcohol abuse), and depression with prior suicidal and homicidal ideation  as well as multiple prior suicide attempts with self-inflicted stab wounds to the abdomen who is being seen 10/06/2023 for the evaluation of CHF and new onset atrial fibrillation   Assessment & Plan     Acute combined heart failure Patient presents with dyspnea on exertion as well as orthopnea and lower extremity edema.  BNP only mildly elevated in the 150s.  Chest x-ray showed  enlarged heart with vascular congestion and small effusions. He also has known cirrhosis.  Albumin only slightly low at 3.0. Echocardiogram shows EF 35 to 40%, moderate LVH, moderate RV dysfunction, moderate left atrial enlargement, mild right atrial enlargement, mild to moderate mitral regurgitation, RAP 15 - Great diuresis, continue IV Lasix. - Currently unable to take p.o., can add GDMT once able to take p.o. - Continue to monitor daily weight, strict I/Os, and renal function.  New Onset Atrial Fibrillation  Patient reports palpitations that he describes as a "fluttering" sensation prior the past several months.  He was noted to be in new onset atrial fibrillation on arrival with rates as high as the 140s to 150s.  He was initially given a dose of IV Metoprolol and then was started on IV Cardizem given persistent tachycardia. -Given systolic dysfunction, Cardizem drip not ideal.  Switched on 12/21 from Cardizem drip to IV metoprolol, as currently unable to take p.o.  Continue IV Lopressor 5 mg every 6 hours.  -Started amiodarone drip today given his high heart rates ; there is risk with chemical cardioversion, however he is on anticoagulation, will be important to stress the need to continue if he converts to sinus rhythm.    -Will convert to oral metoprolol and amiodarone to p.o. when he is able to take orals and heart rates are better controlled -Suspect his tachycardia is being driven by his alcohol withdrawal. Would continue to treat his withdrawal symptoms. He is on CIWA protocol. - CHA2DS2-VASc = 5 (CHF, HTN, stroke  x2, age). He has already been started on Eliquis 5mg  twice daily here.   Complicated situation as he is likely a high risk for long-term anticoagulation candidate given his significant alcohol abuse, severe depression with multiple suicide attempts with self-inflicted stab wounds to the abdomen (most recently in 03/2023).    Hypertension BP stable -Currently unable to take p.o.  IV Lopressor as above   Hypokalemia Replete for goal potassium >4  Otherwise, per primary team: - Cirrhosis - Alcohol abuse with withdrawal - Severe depression with multiple suicide attempts in the past - GERD - Hyperlipidemia - Homelessness   Time Spent Directly with Patient:  I have spent a total of 35 minutes with the patient reviewing hospital notes, telemetry, EKGs, labs and examining the patient as well as establishing an assessment and plan that was discussed personally with the patient.     For questions or updates, please contact [facility] Please consult [email or web address] for contact info under  Signed, Maisie Fus, MD  10/09/2023, 9:39 AM

## 2023-10-10 DIAGNOSIS — F10931 Alcohol use, unspecified with withdrawal delirium: Secondary | ICD-10-CM | POA: Diagnosis not present

## 2023-10-10 DIAGNOSIS — I4891 Unspecified atrial fibrillation: Secondary | ICD-10-CM | POA: Diagnosis not present

## 2023-10-10 DIAGNOSIS — I5041 Acute combined systolic (congestive) and diastolic (congestive) heart failure: Secondary | ICD-10-CM | POA: Diagnosis not present

## 2023-10-10 LAB — COMPREHENSIVE METABOLIC PANEL
ALT: 18 U/L (ref 0–44)
AST: 27 U/L (ref 15–41)
Albumin: 3.1 g/dL — ABNORMAL LOW (ref 3.5–5.0)
Alkaline Phosphatase: 70 U/L (ref 38–126)
Anion gap: 9 (ref 5–15)
BUN: 15 mg/dL (ref 8–23)
CO2: 23 mmol/L (ref 22–32)
Calcium: 8.7 mg/dL — ABNORMAL LOW (ref 8.9–10.3)
Chloride: 105 mmol/L (ref 98–111)
Creatinine, Ser: 1.08 mg/dL (ref 0.61–1.24)
GFR, Estimated: >60 mL/min (ref >60)
Glucose, Bld: 103 mg/dL — ABNORMAL HIGH (ref 70–99)
Potassium: 3.9 mmol/L (ref 3.5–5.1)
Sodium: 137 mmol/L (ref 135–145)
Total Bilirubin: 1.1 mg/dL (ref <1.2)
Total Protein: 7.3 g/dL (ref 6.5–8.1)

## 2023-10-10 MED ORDER — PANTOPRAZOLE SODIUM 40 MG PO TBEC
40.0000 mg | DELAYED_RELEASE_TABLET | Freq: Every day | ORAL | Status: DC
Start: 2023-10-10 — End: 2023-10-15
  Administered 2023-10-10 – 2023-10-14 (×4): 40 mg via ORAL
  Filled 2023-10-10 (×6): qty 1

## 2023-10-10 MED ORDER — CLONIDINE HCL 0.1 MG PO TABS
0.1000 mg | ORAL_TABLET | Freq: Every day | ORAL | Status: DC
  Administered 2023-10-10 – 2023-10-13 (×4): 0.1 mg via ORAL
  Filled 2023-10-10 (×4): qty 1

## 2023-10-10 MED ORDER — CLONAZEPAM 0.5 MG PO TBDP
2.0000 mg | ORAL_TABLET | Freq: Once | ORAL | Status: AC
  Administered 2023-10-11: 2 mg via ORAL
  Filled 2023-10-10: qty 4

## 2023-10-10 MED ORDER — CLONAZEPAM 0.1 MG/ML ORAL SUSPENSION: 2.0000 mg | Freq: Two times a day (BID) | ORAL | Status: DC

## 2023-10-10 MED ORDER — CLONAZEPAM 0.1 MG/ML ORAL SUSPENSION: 2.0000 mg | Freq: Once | ORAL | Status: DC

## 2023-10-10 MED ORDER — QUETIAPINE FUMARATE 25 MG PO TABS
25.0000 mg | ORAL_TABLET | Freq: Every day | ORAL | Status: DC
  Administered 2023-10-10 – 2023-10-15 (×5): 25 mg via ORAL
  Filled 2023-10-10 (×7): qty 1

## 2023-10-10 MED ORDER — CLONAZEPAM 0.5 MG PO TBDP
2.0000 mg | ORAL_TABLET | Freq: Two times a day (BID) | ORAL | Status: AC
  Administered 2023-10-10 (×2): 2 mg via ORAL
  Filled 2023-10-10 (×2): qty 4

## 2023-10-10 MED ORDER — HALOPERIDOL LACTATE 5 MG/ML IJ SOLN
5.0000 mg | Freq: Four times a day (QID) | INTRAMUSCULAR | Status: DC | PRN
  Administered 2023-10-10 – 2023-10-12 (×5): 5 mg via INTRAVENOUS
  Filled 2023-10-10 (×5): qty 1

## 2023-10-10 NOTE — Progress Notes (Signed)
NAME:  [name], MRN:  [id number], DOB:  11/16/1955, LOS: 5 ADMISSION DATE:  10/05/2023, CONSULTATION DATE:  10/10/2023  REFERRING MD:  Marland Mcalpine, CHIEF COMPLAINT: Alcohol withdrawal  History of Present Illness:  67 year old man with hep C/EtOH cirrhosis, homelessness admitted with shortness of breath lower extremity edema and dyspnea.  He was found to have new onset atrial fibrillation with RVR.  Echo showed EF 35 to 40% with RV dysfunction.  He was diuresed with Lasix, rate controlled with Cardizem drip and started on Eliquis. 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  PCCM consulted due to persistent Precedex requirement   Pertinent  Medical History  hypertension,  hepatitis C with cirrhosis,  polysubstance abuse (crack cocaine and alcohol abuse),  Major depression with  multiple prior suicide attempts with self-inflicted stab wounds to the abdomen  Thrombocytopenia  Significant Hospital Events: Including procedures, antibiotic start and stop dates in addition to other pertinent events   12/21 10 mg Ativan required during dayshift 12/22 Needing precedex during day, weaned off  12/23 Afib RVR, off precedex, mentation/agitation improved   Interim History / Subjective:  Patient following commands, remains off precedex    Objective   Blood pressure (!) 160/91, pulse (!) 112, temperature (!) 97.5 F (36.4 C), resp. rate 19, height 5\' 9"  (1.753 m), weight 89.9 kg, SpO2 98%.        Intake/Output Summary (Last 24 hours) at 10/10/2023 0818 Last data filed at 10/10/2023 0548 Gross per 24 hour  Intake 1149.67 ml  Output 775 ml  Net 374.67 ml   Filed Weights   10/05/23 1032  10/09/23 0400 10/10/23 0300  Weight: 76.7 kg 90.8 kg 89.9 kg    Examination:  General: disheveled older adult male, lying on SD bed with posey belt HENT: Normocephalic, left surgical pupil 21mm-non reactive to light, right pupil 2mm reactive to light, poor dentition, Pink MM   Lungs: clear breath sounds bilateral, no distress  Cardiovascular: s1,s2, irregular, Afib, No JVD, no MRG  Abdomen: BS active, soft  Extremities: moves all extremities, non pitting edema, multiple tattoos  Neuro: AOx4, follows commands, RASS 0, no signs of withdrawals (diaphoresis, tremors, agitation, anxiety upon exam)    Resolved Hospital Problem list     Assessment & Plan:   Suspecting Nonspecific Mixed Delirium  Report of agitation overnight, patient needing posey belt  Upon exam, patient is AOx4, following commands, No signs of tremors, diaphoresis, agitation, able to complete serial additions  Low suspicion for ETOH withdrawals due 6 days out from admission P: Initiate delirium precautions -promote sleep/wake cycle-lights on during day -out of bed during day, mobilize to chair -Continue reorientation during patient encounters  -Wean off benzos DC ativan per CIWA protocol and transition to 2mg  PO clonazepam BID, will taper off 12/25 Ordered 5mg  of haldol PRN q 6hrs for agitation, continue cardiac telemetry  Ordered 25mg  of seroquel at night Add Clonidine 0.1mg  daily  Recommend discontinuing restraints, and placing safety sitter if needed    ETOH abuse and hep C cirrhosis hx ETOH withdrawal delirium-resolved  Required ativan and precedex (stopped on 12/22)   Off precedex LFTs within normal range P: Continue  to follow LFTs intermittently Continue folic acid and thiamine  Will begin to taper off benzos, see above   Acute systolic heart failure New onset A-fib with RVR On amio gtt, HR 120s-140s Afib  P: Cardiology following, continue amio gtt and metoprolol IV for rate control  Continue  Eliquis  Continue diuresis per cardiology recs   Dysphagia  P:  Continue dysphagia 3 diet and dysphagia prec per speech recs  Whole meds with puree   Hypokalemia-resolving P: Continue daily BMETs    MDD with past hx of suicide attempts  No suicidal ideation reported during this admission  P:  Continue mirtazapine  Best Practice (right click and "Reselect all SmartList Selections" daily)   Diet/type: Regular consistency (see orders) DVT prophylaxis DOAC Pressure ulcer(s): N/A GI prophylaxis: N/A Lines: N/A Foley:  N/A Code Status:  full code Last date of multidisciplinary goals of care discussion [per [facility] ]  30 mins  Christian Sharran Caratachea AGACNP-BC   [facility] 10/10/2023, 9:19 AM  Please see [email or web address] for pager details.  From 7A-7P if no response, please call [phone]. After hours, please call ELink [phone].
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
## 2023-10-10 NOTE — Plan of Care (Signed)
  Problem: Clinical Measurements: Goal: Ability to maintain clinical measurements within normal limits will improve Outcome: Progressing   Problem: Activity: Goal: Risk for activity intolerance will decrease Outcome: Progressing   Problem: Safety: Goal: Ability to remain free from injury will improve Outcome: Progressing   Problem: Skin Integrity: Goal: Risk for impaired skin integrity will decrease Outcome: Progressing   Problem: Coping: Goal: Level of anxiety will decrease Outcome: Not Progressing   Problem: Pain Management: Goal: General experience of comfort will improve Outcome: Not Progressing
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
## 2023-10-10 NOTE — Plan of Care (Signed)
  Problem: Clinical Measurements: Goal: Diagnostic test results will improve Outcome: Progressing Goal: Respiratory complications will improve Outcome: Progressing   

## 2023-10-10 NOTE — Progress Notes (Signed)
Patient has been extremely agitated this shift with a RASS of 2-3. Soft waist restraint initiated overnight for ongoing impulsive behavior and safety risks. Patient is unable to tell RN or Dr Jacqulyn Bath where he is and is unsure of date when asked despite frequent re-orientation attempts. Patient keeps asking RN for his clothes and belongings and says "I'm leaving" while attempting to get OOB frequently. CCM was consulted this AM d/t CIWA of 27. Delirium precautions implemented by this RN. PRN Haldol and scheduled Klonopin were added by CCM and given by this RN. PRN ativan was discontinued. Patient is displaying no change in behavior at this time. RN will continue to attempt to re-orient patient and ensure safety precautions are in place.

## 2023-10-10 NOTE — Progress Notes (Signed)
eLink Physician-Brief Progress Note Patient Name: [name] DOB: 05/31/1956 MRN: [id number]   Date of Service  10/10/2023  HPI/Events of Note  Camera evaluation for agitation, yelling out.  Discussed with RN. On amiodarone, apixaban for new onset a fib RVR. HR 110. MAP good. On nasal o2. Alcohol withdrawal. On CIWA but not on meds. Was on precedex 2 days ago.   EF 35 to 40%.  Cirrhosis, alcoholic. Substance abuser.    eICU Interventions  RN going to try scheduled trazadone first, if not better to call back. Of of ativan/CIWA. On seroquil, haldol   If worsening, would go for precedex drip again low dose, with risk for qtc prolongation while on amiodarone drip.  Asp and sz precautions      Intervention Category Intermediate Interventions: Other: Minor Interventions: Agitation / anxiety - evaluation and management  Ranee Gosselin 10/10/2023, 8:19 PM

## 2023-10-11 DIAGNOSIS — F10931 Alcohol use, unspecified with withdrawal delirium: Secondary | ICD-10-CM | POA: Diagnosis not present

## 2023-10-11 DIAGNOSIS — I5041 Acute combined systolic (congestive) and diastolic (congestive) heart failure: Secondary | ICD-10-CM

## 2023-10-11 DIAGNOSIS — I4891 Unspecified atrial fibrillation: Secondary | ICD-10-CM | POA: Diagnosis not present

## 2023-10-11 LAB — BASIC METABOLIC PANEL
Anion gap: 5 (ref 5–15)
BUN: 12 mg/dL (ref 8–23)
CO2: 24 mmol/L (ref 22–32)
Calcium: 8.8 mg/dL — ABNORMAL LOW (ref 8.9–10.3)
Chloride: 106 mmol/L (ref 98–111)
Creatinine, Ser: 0.84 mg/dL (ref 0.61–1.24)
GFR, Estimated: >60 mL/min (ref >60)
Glucose, Bld: 89 mg/dL (ref 70–99)
Potassium: 5.1 mmol/L (ref 3.5–5.1)
Sodium: 135 mmol/L (ref 135–145)

## 2023-10-11 LAB — GLUCOSE, CAPILLARY: Glucose-Capillary: 82 mg/dL (ref 70–99)

## 2023-10-11 MED ORDER — FUROSEMIDE 40 MG PO TABS
40.0000 mg | ORAL_TABLET | Freq: Every day | ORAL | Status: DC
Start: 2023-10-11 — End: 2023-10-16
  Administered 2023-10-11 – 2023-10-16 (×6): 40 mg via ORAL
  Filled 2023-10-11 (×6): qty 1

## 2023-10-11 MED ORDER — METOPROLOL TARTRATE 50 MG PO TABS
50.0000 mg | ORAL_TABLET | Freq: Two times a day (BID) | ORAL | Status: DC
  Administered 2023-10-11 – 2023-10-15 (×7): 50 mg via ORAL
  Filled 2023-10-11: qty 2
  Filled 2023-10-11: qty 1
  Filled 2023-10-11 (×8): qty 2
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
## 2023-10-11 NOTE — Progress Notes (Signed)
Attempted to give scheduled 2200 medications several times but patient is very confused and refused all medications, MD notified.
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
## 2023-10-12 ENCOUNTER — Inpatient Hospital Stay (HOSPITAL_COMMUNITY): Payer: Medicare HMO

## 2023-10-12 DIAGNOSIS — I4891 Unspecified atrial fibrillation: Secondary | ICD-10-CM | POA: Diagnosis not present

## 2023-10-12 DIAGNOSIS — I5041 Acute combined systolic (congestive) and diastolic (congestive) heart failure: Secondary | ICD-10-CM | POA: Diagnosis not present

## 2023-10-12 DIAGNOSIS — F10931 Alcohol use, unspecified with withdrawal delirium: Secondary | ICD-10-CM | POA: Diagnosis not present

## 2023-10-12 LAB — BASIC METABOLIC PANEL
Anion gap: 10 (ref 5–15)
BUN: 15 mg/dL (ref 8–23)
CO2: 23 mmol/L (ref 22–32)
Calcium: 8.9 mg/dL (ref 8.9–10.3)
Chloride: 104 mmol/L (ref 98–111)
Creatinine, Ser: 1.01 mg/dL (ref 0.61–1.24)
GFR, Estimated: >60 mL/min (ref >60)
Glucose, Bld: 93 mg/dL (ref 70–99)
Potassium: 4 mmol/L (ref 3.5–5.1)
Sodium: 137 mmol/L (ref 135–145)

## 2023-10-12 LAB — MAGNESIUM: Magnesium: 2.1 mg/dL (ref 1.7–2.4)

## 2023-10-12 LAB — GLUCOSE, CAPILLARY: Glucose-Capillary: 90 mg/dL (ref 70–99)
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
## 2023-10-12 NOTE — Progress Notes (Signed)
OT Cancellation Note  Patient Details Name: [name] MRN: [id number] DOB: 20-Nov-1955   Cancelled Treatment:    Reason Eval/Treat Not Completed: Other (comment) (Imminenet order noted.Discussed with nsg who states DC not planned today. Will follow up at a later time as schedule allows.)  [facility] 10/12/2023, 10:25 AM Luisa Dago, OT/L   Acute OT Clinical Specialist Acute Rehabilitation Services Pager [phone] Office [phone]

## 2023-10-12 NOTE — Progress Notes (Signed)
Patient stable, following commands AO x 3, ICU delirium intermittent, no critical care intervention at this time. PCCM will sign off. Please reach out if patient needs pulmonary/ critical care assistance.  Thank you   Hazel Sams AGACNP-BC   [facility] 10/12/2023, 8:34 AM  Please see [email or web address] for pager details.  From 7A-7P if no response, please call [phone]. After hours, please call ELink [phone].

## 2023-10-13 DIAGNOSIS — J810 Acute pulmonary edema: Secondary | ICD-10-CM | POA: Diagnosis not present

## 2023-10-13 DIAGNOSIS — I5041 Acute combined systolic (congestive) and diastolic (congestive) heart failure: Secondary | ICD-10-CM | POA: Diagnosis not present

## 2023-10-13 DIAGNOSIS — K7460 Unspecified cirrhosis of liver: Secondary | ICD-10-CM | POA: Diagnosis not present

## 2023-10-13 LAB — BASIC METABOLIC PANEL
Anion gap: 10 (ref 5–15)
BUN: 19 mg/dL (ref 8–23)
CO2: 23 mmol/L (ref 22–32)
Calcium: 8.7 mg/dL — ABNORMAL LOW (ref 8.9–10.3)
Chloride: 100 mmol/L (ref 98–111)
Creatinine, Ser: 1.02 mg/dL (ref 0.61–1.24)
GFR, Estimated: >60 mL/min (ref >60)
Glucose, Bld: 109 mg/dL — ABNORMAL HIGH (ref 70–99)
Potassium: 3.5 mmol/L (ref 3.5–5.1)
Sodium: 133 mmol/L — ABNORMAL LOW (ref 135–145)

## 2023-10-13 MED ORDER — POTASSIUM CHLORIDE CRYS ER 20 MEQ PO TBCR
40.0000 meq | EXTENDED_RELEASE_TABLET | Freq: Two times a day (BID) | ORAL | Status: AC
  Administered 2023-10-13 (×2): 40 meq via ORAL
  Filled 2023-10-13 (×2): qty 2

## 2023-10-13 MED ORDER — THIAMINE MONONITRATE 100 MG PO TABS
100.0000 mg | ORAL_TABLET | Freq: Every day | ORAL | Status: DC
  Administered 2023-10-15 – 2023-10-16 (×2): 100 mg via ORAL
  Filled 2023-10-13 (×2): qty 1
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
## 2023-10-13 NOTE — TOC Progression Note (Signed)
Transition of Care [facility]) - Progression Note   Patient Details  Name: [name] MRN: [id number] Date of Birth: March 30, 1956  [facility]) CM/SW Contact  Ewing Schlein, LCSW Phone Number: 10/13/2023, 11:25 AM  Clinical Narrative: TOC continuing to follow for discharge needs.  Expected Discharge Plan: Homeless Shelter Barriers to Discharge: Continued Medical Work up  Social Determinants of Health (SDOH) Interventions SDOH Screenings   Food Insecurity: No Food Insecurity (10/05/2023)  Housing: High Risk (10/05/2023)  Transportation Needs: No Transportation Needs (10/05/2023)  Utilities: Patient Declined (10/05/2023)  Alcohol Screen: Medium Risk (05/29/2022)  Depression (PHQ2-9): Medium Risk (07/01/2023)  Tobacco Use: High Risk (10/05/2023)   Readmission Risk Interventions    10/13/2023   11:24 AM  Readmission Risk Prevention Plan  Transportation Screening Complete  Medication Review (RN Care Manager) Complete  HRI or Home Care Consult Not Complete  HRI or Home Care Consult Pt Refusal Comments Patient is homeless  SW Recovery Care/Counseling Consult Complete  Palliative Care Screening Not Applicable  Skilled Nursing Facility Not Applicable
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
## 2023-10-13 NOTE — TOC Progression Note (Deleted)
Transition of Care [facility]) - Progression Note   Patient Details  Name: [name] MRN: [id number] Date of Birth: September 10, 1956  [facility]) CM/SW Contact  Ewing Schlein, LCSW Phone Number: 10/13/2023, 11:23 AM  Clinical Narrative: TOC continuing to follow for discharge needs.  Expected Discharge Plan: Homeless Shelter Barriers to Discharge: Continued Medical Work up  Social Determinants of Health (SDOH) Interventions SDOH Screenings   Food Insecurity: No Food Insecurity (10/05/2023)  Housing: High Risk (10/05/2023)  Transportation Needs: No Transportation Needs (10/05/2023)  Utilities: Patient Declined (10/05/2023)  Alcohol Screen: Medium Risk (05/29/2022)  Depression (PHQ2-9): Medium Risk (07/01/2023)  Tobacco Use: High Risk (10/05/2023)   Readmission Risk Interventions     No data to display
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
## 2023-10-14 DIAGNOSIS — J810 Acute pulmonary edema: Secondary | ICD-10-CM | POA: Diagnosis not present

## 2023-10-14 DIAGNOSIS — K7460 Unspecified cirrhosis of liver: Secondary | ICD-10-CM

## 2023-10-14 LAB — BASIC METABOLIC PANEL
Anion gap: 6 (ref 5–15)
BUN: 21 mg/dL (ref 8–23)
CO2: 27 mmol/L (ref 22–32)
Calcium: 9.1 mg/dL (ref 8.9–10.3)
Chloride: 102 mmol/L (ref 98–111)
Creatinine, Ser: 1.21 mg/dL (ref 0.61–1.24)
GFR, Estimated: >60 mL/min (ref >60)
Glucose, Bld: 102 mg/dL — ABNORMAL HIGH (ref 70–99)
Potassium: 4.3 mmol/L (ref 3.5–5.1)
Sodium: 135 mmol/L (ref 135–145)

## 2023-10-14 LAB — PROTIME-INR
INR: 1.2 (ref 0.8–1.2)
Prothrombin Time: 15.4 s — ABNORMAL HIGH (ref 11.4–15.2)

## 2023-10-14 LAB — CBC
HCT: 40.8 % (ref 39.0–52.0)
Hemoglobin: 12.6 g/dL — ABNORMAL LOW (ref 13.0–17.0)
MCH: 24.9 pg — ABNORMAL LOW (ref 26.0–34.0)
MCHC: 30.9 g/dL (ref 30.0–36.0)
MCV: 80.5 fL (ref 80.0–100.0)
Platelets: 130 10*3/uL — ABNORMAL LOW (ref 150–400)
RBC: 5.07 MIL/uL (ref 4.22–5.81)
RDW: 15.9 % — ABNORMAL HIGH (ref 11.5–15.5)
WBC: 5.9 10*3/uL (ref 4.0–10.5)
nRBC: 0 % (ref 0.0–0.2)

## 2023-10-14 MED ORDER — FERROUS SULFATE 325 (65 FE) MG PO TABS
325.0000 mg | ORAL_TABLET | Freq: Every day | ORAL | Status: DC
  Administered 2023-10-14 – 2023-10-16 (×3): 325 mg via ORAL
  Filled 2023-10-14 (×3): qty 1

## 2023-10-14 MED ORDER — VITAMIN B-12 100 MCG PO TABS
100.0000 ug | ORAL_TABLET | Freq: Every day | ORAL | Status: DC
  Administered 2023-10-14 – 2023-10-16 (×3): 100 ug via ORAL
  Filled 2023-10-14 (×3): qty 1

## 2023-10-14 NOTE — Plan of Care (Signed)
[place] completed, plan of care and goals discussed with patient with time for questions, patient aware of possible transfer to a telemetry floor once bed becomes available, patient handbook/guide at bedside, bed in lowest locked position wit call bell in reach and bed alarm on.  Problem: Education: Goal: Knowledge of General Education information will improve Description: Including pain rating scale, medication(s)/side effects and non-pharmacologic comfort measures Outcome: Progressing   Problem: Health Behavior/Discharge Planning: Goal: Ability to manage health-related needs will improve Outcome: Progressing   Problem: Clinical Measurements: Goal: Ability to maintain clinical measurements within normal limits will improve Outcome: Progressing Goal: Will remain free from infection Outcome: Progressing Goal: Diagnostic test results will improve Outcome: Progressing Goal: Respiratory complications will improve Outcome: Progressing Goal: Cardiovascular complication will be avoided Outcome: Progressing   Problem: Activity: Goal: Risk for activity intolerance will decrease Outcome: Progressing   Problem: Nutrition: Goal: Adequate nutrition will be maintained Outcome: Progressing   Problem: Coping: Goal: Level of anxiety will decrease Outcome: Progressing   Problem: Elimination: Goal: Will not experience complications related to bowel motility Outcome: Progressing Goal: Will not experience complications related to urinary retention Outcome: Progressing   Problem: Pain Management: Goal: General experience of comfort will improve Outcome: Progressing   Problem: Safety: Goal: Ability to remain free from injury will improve Outcome: Progressing   Problem: Skin Integrity: Goal: Risk for impaired skin integrity will decrease Outcome: Progressing   Problem: Safety: Goal: Non-violent Restraint(s) Outcome: Progressing

## 2023-10-14 NOTE — Plan of Care (Signed)
  Problem: Health Behavior/Discharge Planning: Goal: Ability to manage health-related needs will improve Outcome: Progressing   Problem: Clinical Measurements: Goal: Ability to maintain clinical measurements within normal limits will improve Outcome: Progressing Goal: Diagnostic test results will improve Outcome: Progressing Goal: Respiratory complications will improve Outcome: Progressing   Problem: Nutrition: Goal: Adequate nutrition will be maintained Outcome: Progressing   Problem: Elimination: Goal: Will not experience complications related to bowel motility Outcome: Progressing Goal: Will not experience complications related to urinary retention Outcome: Progressing   Problem: Safety: Goal: Ability to remain free from injury will improve Outcome: Progressing

## 2023-10-14 NOTE — Progress Notes (Signed)
Pt knife, extra blade and nail clipper is locked up in security.
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
## 2023-10-15 ENCOUNTER — Inpatient Hospital Stay (HOSPITAL_COMMUNITY): Payer: Medicare HMO

## 2023-10-15 DIAGNOSIS — K7460 Unspecified cirrhosis of liver: Secondary | ICD-10-CM | POA: Diagnosis not present

## 2023-10-15 DIAGNOSIS — J810 Acute pulmonary edema: Secondary | ICD-10-CM | POA: Diagnosis not present

## 2023-10-15 LAB — TROPONIN I (HIGH SENSITIVITY)
Troponin I (High Sensitivity): 4 ng/L (ref <18)
Troponin I (High Sensitivity): 4 ng/L (ref <18)

## 2023-10-15 MED ORDER — PANTOPRAZOLE SODIUM 40 MG PO TBEC
40.0000 mg | DELAYED_RELEASE_TABLET | Freq: Two times a day (BID) | ORAL | Status: DC
  Administered 2023-10-15 – 2023-10-16 (×3): 40 mg via ORAL
  Filled 2023-10-15 (×3): qty 1

## 2023-10-15 MED ORDER — LOSARTAN POTASSIUM 25 MG PO TABS: 25.0000 mg | ORAL_TABLET | Freq: Every day | ORAL | Status: DC

## 2023-10-15 MED ORDER — NITROGLYCERIN 0.4 MG SL SUBL
0.4000 mg | SUBLINGUAL_TABLET | SUBLINGUAL | Status: DC | PRN
  Administered 2023-10-15: 0.4 mg via SUBLINGUAL
  Filled 2023-10-15: qty 1

## 2023-10-15 MED ORDER — METOPROLOL TARTRATE 5 MG/5ML IV SOLN: 2.5000 mg | Freq: Four times a day (QID) | INTRAVENOUS | Status: DC | PRN

## 2023-10-15 MED ORDER — ALUM & MAG HYDROXIDE-SIMETH 200-200-20 MG/5ML PO SUSP
30.0000 mL | Freq: Four times a day (QID) | ORAL | Status: DC | PRN
  Administered 2023-10-15: 30 mL via ORAL
  Filled 2023-10-15: qty 30

## 2023-10-15 MED ORDER — METOPROLOL TARTRATE 50 MG PO TABS
75.0000 mg | ORAL_TABLET | Freq: Two times a day (BID) | ORAL | Status: DC
  Administered 2023-10-15 – 2023-10-16 (×2): 75 mg via ORAL
  Filled 2023-10-15 (×2): qty 1
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
## 2023-10-15 NOTE — Progress Notes (Addendum)
°   10/15/23 1016  Assess: MEWS Score  Temp 98 F (36.7 C)  BP 94/60  MAP (mmHg) 91  Pulse Rate (!) 118  ECG Heart Rate (!) 114  Resp 19  Level of Consciousness Alert  SpO2 100 %  O2 Device Room Air  Assess: MEWS Score  MEWS Temp 0  MEWS Systolic 1  MEWS Pulse 2  MEWS RR 0  MEWS LOC 0  MEWS Score 3  MEWS Score Color Yellow  Assess: if the MEWS score is Yellow or Red  Were vital signs accurate and taken at a resting state? Yes  Does the patient meet 2 or more of the SIRS criteria? No  MEWS guidelines implemented  Yes, yellow  Treat  MEWS Interventions Considered administering scheduled or prn medications/treatments as ordered  Take Vital Signs  Increase Vital Sign Frequency  Yellow: Q2hr x1, continue Q4hrs until patient remains green for 12hrs  Escalate  MEWS: Escalate Yellow: Discuss with charge nurse and consider notifying provider and/or RRT  Notify: Charge Nurse/RN  Name of Charge Nurse/RN Notified Delford Field, RN  Provider Notification  Provider Name/Title Hartley Barefoot, MD  Date Provider Notified 10/15/23  Time Provider Notified 1049  Method of Notification Page  Notification Reason Other (Comment) (patient's soft BP and elevated HR)  Provider response In department;Other (Comment) (no new orders related to BP and HR)  Date of Provider Response 10/15/23  Time of Provider Response 1057  Assess: SIRS CRITERIA  SIRS Temperature  0  SIRS Respirations  0  SIRS Pulse 1  SIRS WBC 0  SIRS Score Sum  1   Notified MD that EKG was done and was placed in the patient's shadow chart. For some reason EKG did not transfer over to the results in EPIC. One strip had A. Fib with RVR with ventricular rate of 108 BPM and another one had A. Fib with ventricular rate of 97 BPM. Patient was still having the chest pain when RN went to check his BP which was 119/75 and HR 100 so RN gave patient one dose of nitroglycerin. BP 94/60 and HR 118 obtained after the first dose of  nitroglycerin was given. RN gave PRN Maalox for heartburn/indigestions since patient was still complaining of chest pain to see if that would help. Will continue to closely monitor for any changes.

## 2023-10-15 NOTE — Plan of Care (Signed)
  Problem: Education: Goal: Knowledge of General Education information will improve Description: Including pain rating scale, medication(s)/side effects and non-pharmacologic comfort measures Outcome: Progressing   Problem: Activity: Goal: Risk for activity intolerance will decrease Outcome: Progressing   Problem: Nutrition: Goal: Adequate nutrition will be maintained Outcome: Progressing   Problem: Coping: Goal: Level of anxiety will decrease Outcome: Progressing   Problem: Elimination: Goal: Will not experience complications related to urinary retention Outcome: Progressing   Problem: Pain Management: Goal: General experience of comfort will improve Outcome: Progressing   Problem: Safety: Goal: Ability to remain free from injury will improve Outcome: Progressing   Problem: Skin Integrity: Goal: Risk for impaired skin integrity will decrease Outcome: Progressing
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
## 2023-10-16 DIAGNOSIS — J810 Acute pulmonary edema: Secondary | ICD-10-CM | POA: Diagnosis not present

## 2023-10-16 DIAGNOSIS — K7460 Unspecified cirrhosis of liver: Secondary | ICD-10-CM | POA: Diagnosis not present

## 2023-10-16 MED ORDER — MAGNESIUM OXIDE -MG SUPPLEMENT 400 (240 MG) MG PO TABS: 400.0000 mg | ORAL_TABLET | Freq: Every day | ORAL | 0 refills | Status: DC

## 2023-10-16 MED ORDER — QUETIAPINE FUMARATE 25 MG PO TABS: 25.0000 mg | ORAL_TABLET | Freq: Every day | ORAL | 0 refills | Status: DC

## 2023-10-16 MED ORDER — FOLIC ACID 1 MG PO TABS: 1.0000 mg | ORAL_TABLET | Freq: Every day | ORAL | 0 refills | Status: DC

## 2023-10-16 MED ORDER — MIRTAZAPINE 7.5 MG PO TABS: 15.0000 mg | ORAL_TABLET | Freq: Every day | ORAL | 0 refills | Status: DC

## 2023-10-16 MED ORDER — PANTOPRAZOLE SODIUM 40 MG PO TBEC: 40.0000 mg | DELAYED_RELEASE_TABLET | Freq: Every day | ORAL | 0 refills | Status: DC

## 2023-10-16 MED ORDER — CYANOCOBALAMIN 100 MCG PO TABS: 100.0000 ug | ORAL_TABLET | Freq: Every day | ORAL | 0 refills | Status: DC

## 2023-10-16 MED ORDER — VITAMIN B-1 100 MG PO TABS: 100.0000 mg | ORAL_TABLET | Freq: Every day | ORAL | 0 refills | Status: DC

## 2023-10-16 MED ORDER — APIXABAN 5 MG PO TABS: 5.0000 mg | ORAL_TABLET | Freq: Two times a day (BID) | ORAL | 0 refills | Status: DC

## 2023-10-16 MED ORDER — FERROUS SULFATE 325 (65 FE) MG PO TABS: 325.0000 mg | ORAL_TABLET | Freq: Every day | ORAL | 3 refills | Status: DC

## 2023-10-16 MED ORDER — METOPROLOL TARTRATE 75 MG PO TABS: 75.0000 mg | ORAL_TABLET | Freq: Two times a day (BID) | ORAL | 3 refills | Status: DC

## 2023-10-16 MED ORDER — FUROSEMIDE 40 MG PO TABS: 40.0000 mg | ORAL_TABLET | Freq: Every day | ORAL | 1 refills | Status: DC

## 2023-10-16 NOTE — Discharge Summary (Signed)
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 duration of the COVID-19 declaration under Section 564(b)(1) of the Act, 21 U.S.C. section 360bbb-3(b)(1), unless the authorization is terminated or revoked.  Performed at [facility], [address] [place], [place] [address]   MRSA Next Gen by PCR, Nasal     Status: Abnormal   Collection Time: 10/05/23  8:46 PM   Specimen: Nasal Mucosa; Nasal Swab  Result Value Ref Range Status   MRSA by PCR Next Gen DETECTED (A) NOT DETECTED Final    Comment: RESULT CALLED TO, READ BACK BY AND VERIFIED WITH: [facility] RN @ 0144 ON 10/06/2023 BY MTA (NOTE) The GeneXpert MRSA Assay (FDA approved for NASAL specimens only), is one component of a comprehensive MRSA colonization surveillance program. It is not intended to diagnose MRSA infection nor to guide or  monitor treatment for MRSA  infections. Test performance is not FDA approved in patients less than 64 years old. Performed at [facility], [address] [place], [place] [address]     Labs: CBC: Recent Labs  Lab 10/14/23 0918  WBC 5.9  HGB 12.6*  HCT 40.8  MCV 80.5  PLT 130*   Basic Metabolic Panel: Recent Labs  Lab 10/10/23 0256 10/11/23 0307 10/12/23 0307 10/13/23 0553 10/14/23 0327  NA 137 135 137 133* 135  K 3.9 5.1 4.0 3.5 4.3  CL 105 106 104 100 102  CO2 23 24 23 23 27   GLUCOSE 103* 89 93 109* 102*  BUN 15 12 15 19 21   CREATININE 1.08 0.84 1.01 1.02 1.21  CALCIUM 8.7* 8.8* 8.9 8.7* 9.1  MG  --   --  2.1  --   --    Liver Function Tests: Recent Labs  Lab 10/10/23 0256  AST 27  ALT 18  ALKPHOS 70  BILITOT 1.1  PROT 7.3  ALBUMIN 3.1*   CBG: Recent Labs  Lab 10/11/23 1956 10/12/23 0745  GLUCAP 82 90    Discharge time spent: greater than 30 minutes.  Signed: Alba Cory, MD [facility] 10/16/2023

## 2023-10-16 NOTE — Progress Notes (Signed)
Heart Failure Navigator Progress Note  Assessed for Heart & Vascular TOC clinic readiness.  Patient has a scheduled [facility] appointment on 11/02/2023. .   Navigator available for reassessment of patient.   Rhae Hammock, BSN, Scientist, clinical (histocompatibility and immunogenetics) Only

## 2023-10-16 NOTE — Progress Notes (Signed)
Mobility Specialist - Progress Note   10/16/23 0933  Mobility  Activity Ambulated independently in hallway  Level of Assistance Independent  Assistive Device None  Distance Ambulated (ft) 700 ft  Activity Response Tolerated well  Mobility Referral Yes  Mobility visit 1 Mobility  Mobility Specialist Start Time (ACUTE ONLY) [id number]  Mobility Specialist Stop Time (ACUTE ONLY) 0932  Mobility Specialist Time Calculation (min) (ACUTE ONLY) 10 min   Pt received in bed and agreeable to mobility. No complaints during session. Pt to bed after session with all needs met.    [facility]

## 2023-10-18 ENCOUNTER — Other Ambulatory Visit: Payer: Self-pay

## 2023-10-18 ENCOUNTER — Inpatient Hospital Stay (HOSPITAL_COMMUNITY)
Admission: EM | Admit: 2023-10-18 | Discharge: 2023-10-22 | DRG: 392 | Disposition: A | Discharge status: Home / Self Care | Payer: 59 | Attending: Family Medicine | Admitting: Family Medicine

## 2023-10-18 ENCOUNTER — Encounter (HOSPITAL_COMMUNITY): Payer: Self-pay

## 2023-10-18 ENCOUNTER — Emergency Department (HOSPITAL_COMMUNITY): Payer: 59

## 2023-10-18 DIAGNOSIS — D6959 Other secondary thrombocytopenia: Secondary | ICD-10-CM | POA: Diagnosis present

## 2023-10-18 DIAGNOSIS — T447X6A Underdosing of beta-adrenoreceptor antagonists, initial encounter: Secondary | ICD-10-CM | POA: Diagnosis present

## 2023-10-18 DIAGNOSIS — F17210 Nicotine dependence, cigarettes, uncomplicated: Secondary | ICD-10-CM | POA: Diagnosis present

## 2023-10-18 DIAGNOSIS — Z91138 Patient's unintentional underdosing of medication regimen for other reason: Secondary | ICD-10-CM

## 2023-10-18 DIAGNOSIS — I5042 Chronic combined systolic (congestive) and diastolic (congestive) heart failure: Secondary | ICD-10-CM | POA: Diagnosis present

## 2023-10-18 DIAGNOSIS — I110 Hypertensive heart disease with heart failure: Secondary | ICD-10-CM | POA: Diagnosis present

## 2023-10-18 DIAGNOSIS — I4819 Other persistent atrial fibrillation: Secondary | ICD-10-CM | POA: Diagnosis present

## 2023-10-18 DIAGNOSIS — F419 Anxiety disorder, unspecified: Secondary | ICD-10-CM | POA: Diagnosis present

## 2023-10-18 DIAGNOSIS — F32A Depression, unspecified: Secondary | ICD-10-CM | POA: Diagnosis present

## 2023-10-18 DIAGNOSIS — K7030 Alcoholic cirrhosis of liver without ascites: Secondary | ICD-10-CM | POA: Diagnosis not present

## 2023-10-18 DIAGNOSIS — R197 Diarrhea, unspecified: Secondary | ICD-10-CM | POA: Diagnosis present

## 2023-10-18 DIAGNOSIS — R109 Unspecified abdominal pain: Secondary | ICD-10-CM | POA: Diagnosis not present

## 2023-10-18 DIAGNOSIS — K219 Gastro-esophageal reflux disease without esophagitis: Secondary | ICD-10-CM | POA: Diagnosis present

## 2023-10-18 DIAGNOSIS — F1020 Alcohol dependence, uncomplicated: Secondary | ICD-10-CM

## 2023-10-18 DIAGNOSIS — A0811 Acute gastroenteropathy due to Norwalk agent: Secondary | ICD-10-CM | POA: Diagnosis not present

## 2023-10-18 DIAGNOSIS — T501X6A Underdosing of loop [high-ceiling] diuretics, initial encounter: Secondary | ICD-10-CM | POA: Diagnosis present

## 2023-10-18 DIAGNOSIS — I4891 Unspecified atrial fibrillation: Secondary | ICD-10-CM | POA: Diagnosis not present

## 2023-10-18 DIAGNOSIS — Z7901 Long term (current) use of anticoagulants: Secondary | ICD-10-CM

## 2023-10-18 DIAGNOSIS — T45516A Underdosing of anticoagulants, initial encounter: Secondary | ICD-10-CM | POA: Diagnosis present

## 2023-10-18 DIAGNOSIS — D696 Thrombocytopenia, unspecified: Secondary | ICD-10-CM | POA: Diagnosis present

## 2023-10-18 DIAGNOSIS — G629 Polyneuropathy, unspecified: Secondary | ICD-10-CM | POA: Diagnosis present

## 2023-10-18 DIAGNOSIS — R072 Precordial pain: Secondary | ICD-10-CM

## 2023-10-18 DIAGNOSIS — Z79899 Other long term (current) drug therapy: Secondary | ICD-10-CM

## 2023-10-18 DIAGNOSIS — D509 Iron deficiency anemia, unspecified: Secondary | ICD-10-CM | POA: Diagnosis present

## 2023-10-18 DIAGNOSIS — F1010 Alcohol abuse, uncomplicated: Secondary | ICD-10-CM | POA: Diagnosis present

## 2023-10-18 DIAGNOSIS — R946 Abnormal results of thyroid function studies: Secondary | ICD-10-CM | POA: Diagnosis present

## 2023-10-18 DIAGNOSIS — E519 Thiamine deficiency, unspecified: Secondary | ICD-10-CM | POA: Diagnosis present

## 2023-10-18 DIAGNOSIS — E785 Hyperlipidemia, unspecified: Secondary | ICD-10-CM | POA: Diagnosis present

## 2023-10-18 DIAGNOSIS — R1010 Upper abdominal pain, unspecified: Secondary | ICD-10-CM

## 2023-10-18 DIAGNOSIS — I5022 Chronic systolic (congestive) heart failure: Secondary | ICD-10-CM | POA: Diagnosis present

## 2023-10-18 LAB — URINALYSIS, ROUTINE W REFLEX MICROSCOPIC
Bilirubin Urine: NEGATIVE
Glucose, UA: NEGATIVE mg/dL
Hgb urine dipstick: NEGATIVE
Ketones, ur: NEGATIVE mg/dL
Leukocytes,Ua: NEGATIVE
Nitrite: NEGATIVE
Protein, ur: NEGATIVE mg/dL
Specific Gravity, Urine: 1.004 — ABNORMAL LOW (ref 1.005–1.030)
pH: 5 (ref 5.0–8.0)

## 2023-10-18 LAB — LIPASE, BLOOD: Lipase: 38 U/L (ref 11–51)

## 2023-10-18 LAB — BASIC METABOLIC PANEL
Anion gap: 12 (ref 5–15)
BUN: 17 mg/dL (ref 8–23)
CO2: 19 mmol/L — ABNORMAL LOW (ref 22–32)
Calcium: 8.2 mg/dL — ABNORMAL LOW (ref 8.9–10.3)
Chloride: 104 mmol/L (ref 98–111)
Creatinine, Ser: 0.8 mg/dL (ref 0.61–1.24)
GFR, Estimated: >60 mL/min (ref >60)
Glucose, Bld: 100 mg/dL — ABNORMAL HIGH (ref 70–99)
Potassium: 3.9 mmol/L (ref 3.5–5.1)
Sodium: 135 mmol/L (ref 135–145)

## 2023-10-18 LAB — CBC
HCT: 42.6 % (ref 39.0–52.0)
Hemoglobin: 13.4 g/dL (ref 13.0–17.0)
MCH: 24.9 pg — ABNORMAL LOW (ref 26.0–34.0)
MCHC: 31.5 g/dL (ref 30.0–36.0)
MCV: 79 fL — ABNORMAL LOW (ref 80.0–100.0)
Platelets: 149 10*3/uL — ABNORMAL LOW (ref 150–400)
RBC: 5.39 MIL/uL (ref 4.22–5.81)
RDW: 16.5 % — ABNORMAL HIGH (ref 11.5–15.5)
WBC: 10.4 10*3/uL (ref 4.0–10.5)
nRBC: 0 % (ref 0.0–0.2)

## 2023-10-18 LAB — TROPONIN I (HIGH SENSITIVITY)
Troponin I (High Sensitivity): 5 ng/L (ref <18)
Troponin I (High Sensitivity): 5 ng/L (ref <18)

## 2023-10-18 LAB — RAPID URINE DRUG SCREEN, HOSP PERFORMED
Amphetamines: NOT DETECTED
Barbiturates: NOT DETECTED
Benzodiazepines: NOT DETECTED
Cocaine: NOT DETECTED
Opiates: NOT DETECTED
Tetrahydrocannabinol: NOT DETECTED

## 2023-10-18 LAB — HEPATIC FUNCTION PANEL
ALT: 31 U/L (ref 0–44)
AST: 50 U/L — ABNORMAL HIGH (ref 15–41)
Albumin: 3.1 g/dL — ABNORMAL LOW (ref 3.5–5.0)
Alkaline Phosphatase: 64 U/L (ref 38–126)
Bilirubin, Direct: 0.4 mg/dL — ABNORMAL HIGH (ref 0.0–0.2)
Indirect Bilirubin: 1.1 mg/dL — ABNORMAL HIGH (ref 0.3–0.9)
Total Bilirubin: 1.5 mg/dL — ABNORMAL HIGH (ref 0.0–1.2)
Total Protein: 7.9 g/dL (ref 6.5–8.1)

## 2023-10-18 LAB — ETHANOL: Alcohol, Ethyl (B): <10 mg/dL (ref <10)

## 2023-10-18 MED ORDER — PANTOPRAZOLE SODIUM 40 MG IV SOLR
40.0000 mg | Freq: Once | INTRAVENOUS | Status: AC
  Administered 2023-10-18: 40 mg via INTRAVENOUS
  Filled 2023-10-18: qty 10

## 2023-10-18 MED ORDER — THIAMINE HCL 100 MG/ML IJ SOLN: 100.0000 mg | Freq: Every day | INTRAMUSCULAR | Status: DC

## 2023-10-18 MED ORDER — LORAZEPAM 2 MG/ML IJ SOLN: 0.0000 mg | INTRAMUSCULAR | Status: AC

## 2023-10-18 MED ORDER — FOLIC ACID 1 MG PO TABS
1.0000 mg | ORAL_TABLET | Freq: Every day | ORAL | Status: DC
  Administered 2023-10-19 – 2023-10-22 (×4): 1 mg via ORAL
  Filled 2023-10-18 (×4): qty 1

## 2023-10-18 MED ORDER — DILTIAZEM HCL-DEXTROSE 125-5 MG/125ML-% IV SOLN (PREMIX)
5.0000 mg/h | INTRAVENOUS | Status: DC
  Administered 2023-10-18: 5 mg/h via INTRAVENOUS
  Filled 2023-10-18: qty 125

## 2023-10-18 MED ORDER — LORAZEPAM 2 MG/ML IJ SOLN: 1.0000 mg | INTRAMUSCULAR | Status: AC | PRN

## 2023-10-18 MED ORDER — METOCLOPRAMIDE HCL 5 MG/ML IJ SOLN
10.0000 mg | Freq: Once | INTRAMUSCULAR | Status: AC
  Administered 2023-10-18: 10 mg via INTRAVENOUS
  Filled 2023-10-18: qty 2

## 2023-10-18 MED ORDER — THIAMINE HCL 100 MG/ML IJ SOLN
100.0000 mg | Freq: Every day | INTRAMUSCULAR | Status: DC
  Administered 2023-10-19: 100 mg via INTRAVENOUS
  Filled 2023-10-18: qty 2

## 2023-10-18 MED ORDER — LORAZEPAM 2 MG/ML IJ SOLN: 0.0000 mg | Freq: Three times a day (TID) | INTRAMUSCULAR | Status: DC

## 2023-10-18 MED ORDER — LORAZEPAM 2 MG/ML IJ SOLN
1.0000 mg | Freq: Once | INTRAMUSCULAR | Status: AC
  Administered 2023-10-18: 1 mg via INTRAVENOUS
  Filled 2023-10-18: qty 1

## 2023-10-18 MED ORDER — LORAZEPAM 1 MG PO TABS
1.0000 mg | ORAL_TABLET | ORAL | Status: AC | PRN
  Administered 2023-10-19: 1 mg via ORAL
  Filled 2023-10-18 (×2): qty 1

## 2023-10-18 MED ORDER — LACTATED RINGERS IV BOLUS
1000.0000 mL | Freq: Once | INTRAVENOUS | Status: AC
  Administered 2023-10-18: 1000 mL via INTRAVENOUS

## 2023-10-18 MED ORDER — DILTIAZEM LOAD VIA INFUSION
10.0000 mg | Freq: Once | INTRAVENOUS | Status: AC
  Administered 2023-10-18: 10 mg via INTRAVENOUS
  Filled 2023-10-18: qty 10

## 2023-10-18 MED ORDER — ALUM & MAG HYDROXIDE-SIMETH 200-200-20 MG/5ML PO SUSP
30.0000 mL | Freq: Once | ORAL | Status: AC
Start: 2023-10-18 — End: 2023-10-18
  Administered 2023-10-18: 30 mL via ORAL
  Filled 2023-10-18: qty 30

## 2023-10-18 MED ORDER — ONDANSETRON HCL 4 MG/2ML IJ SOLN
4.0000 mg | Freq: Once | INTRAMUSCULAR | Status: AC
  Administered 2023-10-18: 4 mg via INTRAVENOUS
  Filled 2023-10-18: qty 2

## 2023-10-18 MED ORDER — ADULT MULTIVITAMIN W/MINERALS CH
1.0000 | ORAL_TABLET | Freq: Every day | ORAL | Status: DC
  Administered 2023-10-19 – 2023-10-22 (×4): 1 via ORAL
  Filled 2023-10-18 (×4): qty 1

## 2023-10-18 MED ORDER — HYDROMORPHONE HCL 1 MG/ML IJ SOLN
0.5000 mg | Freq: Once | INTRAMUSCULAR | Status: AC
  Administered 2023-10-18: 0.5 mg via INTRAVENOUS
  Filled 2023-10-18: qty 1

## 2023-10-18 MED ORDER — FAMOTIDINE 20 MG PO TABS
20.0000 mg | ORAL_TABLET | Freq: Once | ORAL | Status: AC
  Administered 2023-10-18: 20 mg via ORAL
  Filled 2023-10-18: qty 1

## 2023-10-18 MED ORDER — THIAMINE MONONITRATE 100 MG PO TABS
100.0000 mg | ORAL_TABLET | Freq: Every day | ORAL | Status: DC
  Administered 2023-10-19 – 2023-10-22 (×4): 100 mg via ORAL
  Filled 2023-10-18 (×4): qty 1

## 2023-10-18 NOTE — Assessment & Plan Note (Addendum)
 Would appreciate cardiology consult.  Patient has history of systolic CHF making diltiazem  less desirable of her choice.  Also in the past amiodarone  was considered to be not an option given history of cirrhosis.  Patient supposed to take metoprolol  at home but did not comply.  Would defer to cardiology regarding current management Currently heart rate improved in 110 Would likely need to restart home dose of metoprolol  tomorrow if okay with cardiology Patient noncompliant with Eliquis  Will restart

## 2023-10-18 NOTE — ED Notes (Signed)
 Pt states he did not pick up his new medications at the pharmacy since being discharged 3 days ago.  However, he has been taking his old medications.  Also, he is an alcoholic and he has been drinking since discharge.

## 2023-10-18 NOTE — Assessment & Plan Note (Signed)
 Order gastric panel supportive management
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
## 2023-10-18 NOTE — Assessment & Plan Note (Signed)
 Order CIWA protocol Monitor for signs of withdrawal in the past patient required Precedex

## 2023-10-18 NOTE — ED Provider Notes (Addendum)
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 Prescription drug management. Parenteral controlled substances. Decision regarding hospitalization.   Iv ns. Continuous pulse ox and cardiac monitoring. Labs ordered/sent. Imaging ordered.   Differential diagnosis includes  acs, gi cp, msk cp, afib rvr, etc. Dispo decision including potential need for admission considered - will get labs and imaging and reassess.   Reviewed nursing notes and prior charts for additional history. External reports reviewed. Additional history from: EMS.  Cardizem  bolus/gtt. Protonix  iv. Zofran  iv.   Cardiac monitor:afib, rate 130.   Labs reviewed/interpreted by me - wbc and hct normal. Na/k normal. Trop normal.   Xrays reviewed/interpreted by me - no pna.   Given rapid afib, not compliant w anticoagulation therapy, not able to cardiovert in ED. Pt is requiring iv rate control therapy.  Hospitalists consulted for admission. Discussed pt with Dr Silvester - will admit. She requests cardiology consult re management afib.  Last admission amio was d/c'd given pts hx cirrhosis and he was subsequently txd with beta  blocker. Pt currently appears to be tolerating diltiazem  well. Cardiology consulted. Discussed pt - she indicates she will see in consult and discuss her recommendations with Dr Silvester.   CRITICAL CARE RE: afib w rvr, alcohol  use disorder, chest pain Performed by: Rydell Wiegel E Cashus Halterman Total critical care time: 40 minutes Critical care time was exclusive of separately billable procedures and treating other patients. Critical care was necessary to treat or prevent imminent or life-threatening deterioration. Critical care was time spent personally by me on the following activities: development of treatment plan with patient and/or surrogate as well as nursing, discussions with consultants, evaluation of patient's response to treatment, examination of patient, obtaining history from patient or surrogate, ordering and performing treatments and interventions, ordering and review of laboratory studies, ordering and review of radiographic studies, pulse oximetry and re-evaluation of patient's condition.   Recheck, no current chest pain. Hr 110. Bp normal.       Final Clinical Impression(s) / ED Diagnoses Final diagnoses:  None    Rx / DC Orders ED Discharge Orders     None         Bernard Drivers, MD 10/18/23 2312

## 2023-10-18 NOTE — Assessment & Plan Note (Addendum)
 Avoid fluid overload. At the time of discharge patient was discharged on Lasix  40 mg a day but he did not take will try to resume once euvolemic.  Patient has been having a lot of nausea and vomiting with diarrhea which likely maintain his euvolemia at this time

## 2023-10-18 NOTE — ED Notes (Signed)
 Severe hiccups noted, zofran administered.

## 2023-10-18 NOTE — Subjective & Objective (Signed)
 Patient reports epigastric pain started around 8 AM today associated with nausea but no vomiting Has drink 40 ounce of beer today.  Has not picked up his medications from pharmacy Has been drinking heavily since his discharge Had recent admission to [facility]  Reports yellow emesis no blood or bile no focal pain loose stools but no diarrhea nonbloody no fevers or chills Recent admission for A-fib with RVR and alcohol  withdrawal Has not been compliant with anticoagulation No cough or sore throat no URI symptoms On arrival found to be again in A-fib with RVR started on Cardizem  and given a dose of IV Protonix 

## 2023-10-18 NOTE — ED Triage Notes (Addendum)
 Pt bib [facility] called out epigastric that began around 0800 today. Pt was released from [facility] ICU around 3 days ago. Pt denies shortness of breath. Endorses nausea, denies vomiting. Pt reports to EMS that he has consumed two 40oz beers today. Alert and oriented x4.   EMS vitals:  90-200 HR - Afib RVR 124/72 96% ra 123 cbg 324 aspirin given by EMS

## 2023-10-19 ENCOUNTER — Inpatient Hospital Stay (HOSPITAL_COMMUNITY): Payer: 59

## 2023-10-19 DIAGNOSIS — A0811 Acute gastroenteropathy due to Norwalk agent: Secondary | ICD-10-CM | POA: Diagnosis present

## 2023-10-19 DIAGNOSIS — D509 Iron deficiency anemia, unspecified: Secondary | ICD-10-CM | POA: Diagnosis present

## 2023-10-19 DIAGNOSIS — E785 Hyperlipidemia, unspecified: Secondary | ICD-10-CM | POA: Diagnosis present

## 2023-10-19 DIAGNOSIS — R109 Unspecified abdominal pain: Secondary | ICD-10-CM | POA: Diagnosis present

## 2023-10-19 DIAGNOSIS — Z91138 Patient's unintentional underdosing of medication regimen for other reason: Secondary | ICD-10-CM | POA: Diagnosis not present

## 2023-10-19 DIAGNOSIS — G629 Polyneuropathy, unspecified: Secondary | ICD-10-CM | POA: Diagnosis present

## 2023-10-19 DIAGNOSIS — K219 Gastro-esophageal reflux disease without esophagitis: Secondary | ICD-10-CM | POA: Diagnosis present

## 2023-10-19 DIAGNOSIS — F17210 Nicotine dependence, cigarettes, uncomplicated: Secondary | ICD-10-CM | POA: Diagnosis present

## 2023-10-19 DIAGNOSIS — I4891 Unspecified atrial fibrillation: Secondary | ICD-10-CM

## 2023-10-19 DIAGNOSIS — I4819 Other persistent atrial fibrillation: Secondary | ICD-10-CM | POA: Diagnosis present

## 2023-10-19 DIAGNOSIS — I5042 Chronic combined systolic (congestive) and diastolic (congestive) heart failure: Secondary | ICD-10-CM | POA: Diagnosis present

## 2023-10-19 DIAGNOSIS — Z79899 Other long term (current) drug therapy: Secondary | ICD-10-CM | POA: Diagnosis not present

## 2023-10-19 DIAGNOSIS — I110 Hypertensive heart disease with heart failure: Secondary | ICD-10-CM | POA: Diagnosis present

## 2023-10-19 DIAGNOSIS — R946 Abnormal results of thyroid function studies: Secondary | ICD-10-CM | POA: Diagnosis present

## 2023-10-19 DIAGNOSIS — E519 Thiamine deficiency, unspecified: Secondary | ICD-10-CM | POA: Diagnosis present

## 2023-10-19 DIAGNOSIS — Z7901 Long term (current) use of anticoagulants: Secondary | ICD-10-CM | POA: Diagnosis not present

## 2023-10-19 DIAGNOSIS — T45516A Underdosing of anticoagulants, initial encounter: Secondary | ICD-10-CM | POA: Diagnosis present

## 2023-10-19 DIAGNOSIS — T447X6A Underdosing of beta-adrenoreceptor antagonists, initial encounter: Secondary | ICD-10-CM | POA: Diagnosis present

## 2023-10-19 DIAGNOSIS — K7030 Alcoholic cirrhosis of liver without ascites: Secondary | ICD-10-CM | POA: Diagnosis present

## 2023-10-19 DIAGNOSIS — D6959 Other secondary thrombocytopenia: Secondary | ICD-10-CM | POA: Diagnosis present

## 2023-10-19 DIAGNOSIS — F419 Anxiety disorder, unspecified: Secondary | ICD-10-CM | POA: Diagnosis present

## 2023-10-19 DIAGNOSIS — T501X6A Underdosing of loop [high-ceiling] diuretics, initial encounter: Secondary | ICD-10-CM | POA: Diagnosis present

## 2023-10-19 DIAGNOSIS — F32A Depression, unspecified: Secondary | ICD-10-CM | POA: Diagnosis present

## 2023-10-19 LAB — PHOSPHORUS
Phosphorus: 2.7 mg/dL (ref 2.5–4.6)
Phosphorus: 2.9 mg/dL (ref 2.5–4.6)

## 2023-10-19 LAB — LACTIC ACID, PLASMA
Lactic Acid, Venous: 1.7 mmol/L (ref 0.5–1.9)
Lactic Acid, Venous: 1.4 mmol/L (ref 0.5–1.9)

## 2023-10-19 LAB — MAGNESIUM
Magnesium: 1.6 mg/dL — ABNORMAL LOW (ref 1.7–2.4)
Magnesium: 1.8 mg/dL (ref 1.7–2.4)

## 2023-10-19 LAB — COMPREHENSIVE METABOLIC PANEL
ALT: 25 U/L (ref 0–44)
AST: 40 U/L (ref 15–41)
Albumin: 2.6 g/dL — ABNORMAL LOW (ref 3.5–5.0)
Alkaline Phosphatase: 51 U/L (ref 38–126)
Anion gap: 7 (ref 5–15)
BUN: 13 mg/dL (ref 8–23)
CO2: 24 mmol/L (ref 22–32)
Calcium: 8 mg/dL — ABNORMAL LOW (ref 8.9–10.3)
Chloride: 106 mmol/L (ref 98–111)
Creatinine, Ser: 0.91 mg/dL (ref 0.61–1.24)
GFR, Estimated: >60 mL/min (ref >60)
Glucose, Bld: 107 mg/dL — ABNORMAL HIGH (ref 70–99)
Potassium: 3.8 mmol/L (ref 3.5–5.1)
Sodium: 137 mmol/L (ref 135–145)
Total Bilirubin: 1.4 mg/dL — ABNORMAL HIGH (ref 0.0–1.2)
Total Protein: 6.2 g/dL — ABNORMAL LOW (ref 6.5–8.1)

## 2023-10-19 LAB — AMMONIA: Ammonia: 54 umol/L — ABNORMAL HIGH (ref 9–35)

## 2023-10-19 LAB — CBC
HCT: 37.7 % — ABNORMAL LOW (ref 39.0–52.0)
Hemoglobin: 11.8 g/dL — ABNORMAL LOW (ref 13.0–17.0)
MCH: 24.5 pg — ABNORMAL LOW (ref 26.0–34.0)
MCHC: 31.3 g/dL (ref 30.0–36.0)
MCV: 78.2 fL — ABNORMAL LOW (ref 80.0–100.0)
Platelets: 104 10*3/uL — ABNORMAL LOW (ref 150–400)
RBC: 4.82 MIL/uL (ref 4.22–5.81)
RDW: 16.5 % — ABNORMAL HIGH (ref 11.5–15.5)
WBC: 5.1 10*3/uL (ref 4.0–10.5)
nRBC: 0 % (ref 0.0–0.2)

## 2023-10-19 LAB — PROTIME-INR
INR: 1.3 — ABNORMAL HIGH (ref 0.8–1.2)
Prothrombin Time: 16.8 s — ABNORMAL HIGH (ref 11.4–15.2)

## 2023-10-19 LAB — BRAIN NATRIURETIC PEPTIDE: B Natriuretic Peptide: 259.6 pg/mL — ABNORMAL HIGH (ref 0.0–100.0)

## 2023-10-19 LAB — TSH: TSH: 6.372 u[IU]/mL — ABNORMAL HIGH (ref 0.350–4.500)

## 2023-10-19 LAB — CK: Total CK: 51 U/L (ref 49–397)

## 2023-10-19 LAB — T4, FREE: Free T4: 0.77 ng/dL (ref 0.61–1.12)

## 2023-10-19 LAB — PREALBUMIN: Prealbumin: 15 mg/dL — ABNORMAL LOW (ref 18–38)

## 2023-10-19 MED ORDER — GABAPENTIN 300 MG PO CAPS
300.0000 mg | ORAL_CAPSULE | Freq: Three times a day (TID) | ORAL | Status: DC
  Administered 2023-10-19 – 2023-10-22 (×10): 300 mg via ORAL
  Filled 2023-10-19 (×10): qty 1

## 2023-10-19 MED ORDER — ALBUTEROL SULFATE (2.5 MG/3ML) 0.083% IN NEBU: 2.5000 mg | INHALATION_SOLUTION | RESPIRATORY_TRACT | Status: DC | PRN

## 2023-10-19 MED ORDER — ONDANSETRON HCL 4 MG PO TABS
4.0000 mg | ORAL_TABLET | Freq: Four times a day (QID) | ORAL | Status: DC | PRN
  Administered 2023-10-19: 4 mg via ORAL
  Filled 2023-10-19: qty 1

## 2023-10-19 MED ORDER — FENTANYL CITRATE PF 50 MCG/ML IJ SOSY
12.5000 ug | PREFILLED_SYRINGE | INTRAMUSCULAR | Status: DC | PRN
  Administered 2023-10-19 (×2): 50 ug via INTRAVENOUS
  Filled 2023-10-19 (×2): qty 1

## 2023-10-19 MED ORDER — SODIUM CHLORIDE 0.9 % IV SOLN
12.5000 mg | Freq: Once | INTRAVENOUS | Status: DC
  Filled 2023-10-19: qty 0.5

## 2023-10-19 MED ORDER — QUETIAPINE FUMARATE 25 MG PO TABS
25.0000 mg | ORAL_TABLET | Freq: Every day | ORAL | Status: DC
  Administered 2023-10-19 – 2023-10-21 (×4): 25 mg via ORAL
  Filled 2023-10-19 (×4): qty 1

## 2023-10-19 MED ORDER — ROSUVASTATIN CALCIUM 20 MG PO TABS
20.0000 mg | ORAL_TABLET | Freq: Every day | ORAL | Status: DC
  Administered 2023-10-19 – 2023-10-22 (×4): 20 mg via ORAL
  Filled 2023-10-19 (×4): qty 1

## 2023-10-19 MED ORDER — MIRTAZAPINE 15 MG PO TABS
15.0000 mg | ORAL_TABLET | Freq: Every day | ORAL | Status: DC
  Administered 2023-10-19 – 2023-10-21 (×4): 15 mg via ORAL
  Filled 2023-10-19 (×4): qty 1

## 2023-10-19 MED ORDER — METOPROLOL TARTRATE 5 MG/5ML IV SOLN: 5.0000 mg | INTRAVENOUS | Status: DC | PRN

## 2023-10-19 MED ORDER — PANTOPRAZOLE SODIUM 40 MG PO TBEC
40.0000 mg | DELAYED_RELEASE_TABLET | Freq: Every day | ORAL | Status: DC
  Administered 2023-10-19 – 2023-10-22 (×4): 40 mg via ORAL
  Filled 2023-10-19 (×4): qty 1

## 2023-10-19 MED ORDER — MORPHINE SULFATE (PF) 2 MG/ML IV SOLN
2.0000 mg | Freq: Once | INTRAVENOUS | Status: AC
  Administered 2023-10-19: 2 mg via INTRAVENOUS
  Filled 2023-10-19: qty 1

## 2023-10-19 MED ORDER — THIAMINE MONONITRATE 100 MG PO TABS: 100.0000 mg | ORAL_TABLET | Freq: Every day | ORAL | Status: DC

## 2023-10-19 MED ORDER — VITAMIN B-12 100 MCG PO TABS
100.0000 ug | ORAL_TABLET | Freq: Every day | ORAL | Status: DC
  Administered 2023-10-19 – 2023-10-22 (×4): 100 ug via ORAL
  Filled 2023-10-19 (×4): qty 1

## 2023-10-19 MED ORDER — MORPHINE SULFATE (PF) 4 MG/ML IV SOLN
4.0000 mg | INTRAVENOUS | Status: DC | PRN
  Administered 2023-10-19 – 2023-10-22 (×8): 4 mg via INTRAVENOUS
  Filled 2023-10-19 (×8): qty 1

## 2023-10-19 MED ORDER — FUROSEMIDE 40 MG PO TABS
40.0000 mg | ORAL_TABLET | Freq: Every day | ORAL | Status: DC
  Administered 2023-10-19 – 2023-10-22 (×4): 40 mg via ORAL
  Filled 2023-10-19 (×2): qty 1
  Filled 2023-10-19: qty 2
  Filled 2023-10-19: qty 1

## 2023-10-19 MED ORDER — FOLIC ACID 1 MG PO TABS: 1.0000 mg | ORAL_TABLET | Freq: Every day | ORAL | Status: DC

## 2023-10-19 MED ORDER — MAGNESIUM OXIDE -MG SUPPLEMENT 400 (240 MG) MG PO TABS
400.0000 mg | ORAL_TABLET | Freq: Every day | ORAL | Status: DC
  Administered 2023-10-19 – 2023-10-22 (×4): 400 mg via ORAL
  Filled 2023-10-19 (×4): qty 1

## 2023-10-19 MED ORDER — ONDANSETRON HCL 4 MG/2ML IJ SOLN
4.0000 mg | Freq: Four times a day (QID) | INTRAMUSCULAR | Status: DC | PRN
  Administered 2023-10-19: 4 mg via INTRAVENOUS
  Filled 2023-10-19 (×2): qty 2

## 2023-10-19 MED ORDER — METOPROLOL TARTRATE 50 MG PO TABS
50.0000 mg | ORAL_TABLET | Freq: Two times a day (BID) | ORAL | Status: DC
  Administered 2023-10-19 – 2023-10-20 (×3): 50 mg via ORAL
  Filled 2023-10-19: qty 1
  Filled 2023-10-19: qty 2
  Filled 2023-10-19: qty 1

## 2023-10-19 MED ORDER — FERROUS SULFATE 325 (65 FE) MG PO TABS
325.0000 mg | ORAL_TABLET | Freq: Every day | ORAL | Status: DC
  Administered 2023-10-19 – 2023-10-22 (×4): 325 mg via ORAL
  Filled 2023-10-19 (×4): qty 1

## 2023-10-19 MED ORDER — APIXABAN 5 MG PO TABS
5.0000 mg | ORAL_TABLET | Freq: Two times a day (BID) | ORAL | Status: DC
  Administered 2023-10-19 – 2023-10-22 (×7): 5 mg via ORAL
  Filled 2023-10-19 (×7): qty 1
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
## 2023-10-19 NOTE — Discharge Instructions (Signed)
   Outpatient Substance Abuse  Treatment- uninsured  Narcotics Anonymous 24-HOUR HELPLINE Pre-recorded for Meeting Schedules [facility] [phone]  [email or web address] ALCOHOLICS ANONYMOUS  [place]   Answering Service [phone] Please Note: All [place] Meetings are Non-smoking [email or web address]  Alcohol and Drug Services -  Insurance: Medicaid /State funding/private insurance Methadone, suboxone/Intensive outpatient  [place] [phone] Fax: [phone] [address] [place], [place], [address] [place] [phone] Fax: [phone]    [address] [place], [place], [address] ([address] [place], [place], [place], [place], [place], [place], [place], [place]) [facility] [email or web address] Accepts State funding/Medicaid Transitional housing, Intensive Outpatient Treatment, Outpatient treatment, Veterans Services  Phone: [phone] Fax: [phone] Address: [address] [place] [address]   [facility] ([email or web address].[email or web address]/) Insurance: Medicaid Case Management, Administrator, arts, Medication Management, Outpatient Therapy, Psychosocial Rehabilitation, Substance Abuse Intensive Outpatient  Phone: [phone] Fax: [name], [place], [place], [address]  [facility] Medicaid, most private insurance providers Types of Program: Individual/Group Therapy, Substance Abuse Treatment  Phone: [place] [phone] Fax: [phone] [address], [place], [place], [address] [name] [phone] [address], [address] [name], [place], [address]  [facility]  Medicaid Types of Program: SAIOP  Phone: [phone] Fax: [phone] [address] [place], [place], [place], [address] [facility] Medicaid/state funds Crisis line [phone] [place] [facility] [phone] [place] [phone] [place] [phone]  [facility] [address];  [place], [place] [address] [phone]  Substance Abuse Intensive Outpatient Program [facility] [address] [place], [place] [address] [phone]- Substance abuse treatment  Successful Transitions  Insurance: [facility], [address], sliding scale Types of Program: substance abuse treatment, transportation assistance Phone: [phone] Fax: [phone] Address: [address], [place] [address] [facility] ([email or web address]) Insurance: UHC, [place], [place], [place] of [place] Program: addiction counseling, detoxification,  Phone: [phone]  Fax: [phone] Address: [address], [place] [address]  [facility] (statewide facilities/programs) [address] (Medicaid/state funds) [place], [place] [address]                      [email or web address]/ [phone] Daniel mcalpine- [phone] [place]- [phone] [facility] [place] (2 Locations) (Medicaid/state funds) --[address]  walk in 8:30-12 and 1-2:30 [place], [id number]   [facility]- [phone] --[address] [place], [place] [address]  [phone] walk in 8:30-12 and 2-3:30  [facility] state funds/medicaid [address] [place], [place] [address] [id number] [facility] (Suboxone clinic) Medicaid/state funds  [address] [place], [place] [address] [phone]   [facility]  [address] [place], [place] [address]  [phone] (24 hours) [place]- [address] [place], [place] [address]  [phone] (24 hours) [place]- [address] [name] [phone] [place]- [address] [place] [phone] [place] [address] [place] [phone] [facility]- Medicaid and state funds  [place]- [address] [place], [place] [address] [phone] (24 hours) [place]- [address] [place], [place]  [phone] [facility]- [address] [place], [place] [address] [phone] (24 hours) [place] [address] [place], [place]  [address] [phone] [place]- [address] [place] [phone]
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
## 2023-10-19 NOTE — ED Notes (Signed)
 Pt has been ambulating independently to the bathroom.

## 2023-10-19 NOTE — Progress Notes (Signed)
 CSW added substance abuse resources to patient's AVS.  Edwin Dada, MSW, LCSW Transitions of Care  Clinical Social Worker II [phone]

## 2023-10-19 NOTE — Assessment & Plan Note (Signed)
 Mild likely secondary to cirrhosis

## 2023-10-19 NOTE — Assessment & Plan Note (Signed)
 MELD 3.0: 11 at 07/04/2023   Patient continues to drink alcohol. Again strongly encouraged to stop drinking Noted mild thrombocytopenia Check INR

## 2023-10-20 DIAGNOSIS — I4891 Unspecified atrial fibrillation: Secondary | ICD-10-CM | POA: Diagnosis not present

## 2023-10-20 LAB — COMPREHENSIVE METABOLIC PANEL
ALT: 22 U/L (ref 0–44)
AST: 27 U/L (ref 15–41)
Albumin: 2.6 g/dL — ABNORMAL LOW (ref 3.5–5.0)
Alkaline Phosphatase: 53 U/L (ref 38–126)
Anion gap: 10 (ref 5–15)
BUN: 13 mg/dL (ref 8–23)
CO2: 21 mmol/L — ABNORMAL LOW (ref 22–32)
Calcium: 8.5 mg/dL — ABNORMAL LOW (ref 8.9–10.3)
Chloride: 106 mmol/L (ref 98–111)
Creatinine, Ser: 1 mg/dL (ref 0.61–1.24)
GFR, Estimated: >60 mL/min (ref >60)
Glucose, Bld: 104 mg/dL — ABNORMAL HIGH (ref 70–99)
Potassium: 3.6 mmol/L (ref 3.5–5.1)
Sodium: 137 mmol/L (ref 135–145)
Total Bilirubin: 1 mg/dL (ref 0.0–1.2)
Total Protein: 6.4 g/dL — ABNORMAL LOW (ref 6.5–8.1)

## 2023-10-20 LAB — GASTROINTESTINAL PANEL BY PCR, STOOL (REPLACES STOOL CULTURE)

## 2023-10-20 LAB — C DIFFICILE QUICK SCREEN W PCR REFLEX
C Diff antigen: NEGATIVE
C Diff interpretation: NOT DETECTED
C Diff toxin: NEGATIVE

## 2023-10-20 LAB — CBC WITH DIFFERENTIAL/PLATELET
Abs Immature Granulocytes: 0 10*3/uL (ref 0.00–0.07)
Basophils Absolute: 0 10*3/uL (ref 0.0–0.1)
Basophils Relative: 1 %
Eosinophils Absolute: 0.2 10*3/uL (ref 0.0–0.5)
Eosinophils Relative: 4 %
HCT: 36.8 % — ABNORMAL LOW (ref 39.0–52.0)
Hemoglobin: 11.4 g/dL — ABNORMAL LOW (ref 13.0–17.0)
Immature Granulocytes: 0 %
Lymphocytes Relative: 25 %
Lymphs Abs: 1.5 10*3/uL (ref 0.7–4.0)
MCH: 24.8 pg — ABNORMAL LOW (ref 26.0–34.0)
MCHC: 31 g/dL (ref 30.0–36.0)
MCV: 80.2 fL (ref 80.0–100.0)
Monocytes Absolute: 0.7 10*3/uL (ref 0.1–1.0)
Monocytes Relative: 12 %
Neutro Abs: 3.5 10*3/uL (ref 1.7–7.7)
Neutrophils Relative %: 58 %
Platelets: 123 10*3/uL — ABNORMAL LOW (ref 150–400)
RBC: 4.59 MIL/uL (ref 4.22–5.81)
RDW: 16.4 % — ABNORMAL HIGH (ref 11.5–15.5)
WBC: 6 10*3/uL (ref 4.0–10.5)
nRBC: 0 % (ref 0.0–0.2)

## 2023-10-20 LAB — T3, FREE: T3, Free: 2.4 pg/mL (ref 2.0–4.4)

## 2023-10-20 LAB — MAGNESIUM: Magnesium: 1.6 mg/dL — ABNORMAL LOW (ref 1.7–2.4)

## 2023-10-20 MED ORDER — MAGNESIUM SULFATE 2 GM/50ML IV SOLN
2.0000 g | Freq: Once | INTRAVENOUS | Status: AC
  Administered 2023-10-20: 2 g via INTRAVENOUS
  Filled 2023-10-20: qty 50

## 2023-10-20 MED ORDER — METOPROLOL TARTRATE 50 MG PO TABS
75.0000 mg | ORAL_TABLET | Freq: Two times a day (BID) | ORAL | Status: DC
  Administered 2023-10-20 – 2023-10-22 (×4): 75 mg via ORAL
  Filled 2023-10-20 (×4): qty 1

## 2023-10-20 NOTE — Evaluation (Addendum)
 Physical Therapy Evaluation and Discharge Patient Details Name: [name] MRN: [id number] DOB: 07-18-1956 Today's Date: 10/20/2023  History of Present Illness  Pt is a 68 y.o. M who presents 10/18/2023 with nausea and vomiting. Found to be in persistent atrial fibrillation with RVR. Significant PMH: a-fib with RVR, alcohol  abuse, combined systolic diastolic CHF, liver cirrhosis, suicidal homicidal ideation, esophageal stricture, chronically dilated left pupil.  Clinical Impression  Patient evaluated by Physical Therapy with no further acute PT needs identified. Pt reports lower abdominal pain and diarrhea overnight. Pt agreeable to limited evaluation. Pt ambulating 60 ft with no assistive device independently. SpO2 95% on RA, irregular HR 94-128. PTA, pt is homeless and [place] of [address] Pt states he is limited in walking longer distances due to dyspnea on exertion. Recommend continued ambulation while inpatient with mobility specialist. All education has been completed and the patient has no further questions. No follow-up Physical Therapy or equipment needs. PT is signing off. Thank you for this referral.       If plan is discharge home, recommend the following: Other (comment) (pt is homeless)   Can travel by [employer] Recommendations None recommended by PT  Recommendations for Other Services    Social Worker [facility])   Functional Status Assessment Patient has not had a recent decline in their functional status     Precautions / Restrictions Precautions Precautions: Other (comment) Precaution Comments: watch HR Restrictions Weight Bearing Restrictions Per Provider Order: No      Mobility  Bed Mobility Overal bed mobility: Independent                  Transfers Overall transfer level: Independent Equipment used: None                    Ambulation/Gait Ambulation/Gait assistance: Independent Gait Distance  (Feet): 60 Feet Assistive device: None Gait Pattern/deviations: WFL(Within Functional Limits)          Stairs            Wheelchair Mobility     Tilt Bed    Modified Rankin (Stroke Patients Only)       Balance Overall balance assessment: No apparent balance deficits (not formally assessed)                                           Pertinent Vitals/Pain Pain Assessment Pain Assessment: Faces Faces Pain Scale: Hurts a little bit Pain Location: lower abdomen Pain Descriptors / Indicators: Discomfort Pain Intervention(s): Monitored during session    Home Living Family/patient expects to be discharged to:: Shelter/Homeless                        Prior Function Prior Level of Function : Independent/Modified Independent             Mobility Comments: [place] [address]. Pt reports difficulty walking longer distances due to SOB and falls down inclines. ADLs Comments: Gets prescriptions from [facility], panhandles for food/water      Extremity/Trunk Assessment   Upper Extremity Assessment Upper Extremity Assessment: Overall WFL for tasks assessed    Lower Extremity Assessment Lower Extremity Assessment: Overall WFL for tasks assessed    Cervical / Trunk Assessment Cervical / Trunk Assessment: Normal  Communication  Communication Communication: No apparent difficulties  Cognition Arousal: Alert Behavior During Therapy: WFL for tasks assessed/performed Overall Cognitive Status: Within Functional Limits for tasks assessed                                          General Comments      Exercises     Assessment/Plan    PT Assessment Patient does not need any further PT services  PT Problem List Decreased activity tolerance;Decreased mobility       PT Treatment Interventions      PT Goals (Current goals can be found in the Care Plan section)  Acute Rehab PT Goals Patient  Stated Goal: to sleep PT Goal Formulation: All assessment and education complete, DC therapy    Frequency       Co-evaluation               AM-PAC PT 6 Clicks Mobility  Outcome Measure Help needed turning from your back to your side while in a flat bed without using bedrails?: None Help needed moving from lying on your back to sitting on the side of a flat bed without using bedrails?: None Help needed moving to and from a bed to a chair (including a wheelchair)?: None Help needed standing up from a chair using your arms (e.g., wheelchair or bedside chair)?: None Help needed to walk in hospital room?: None Help needed climbing 3-5 steps with a railing? : A Little 6 Click Score: 23    End of Session   Activity Tolerance: Patient tolerated treatment well Patient left: in bed;with call bell/phone within reach   PT Visit Diagnosis: History of falling (Z91.81)    Time: 9242-9193 PT Time Calculation (min) (ACUTE ONLY): 9 min   Charges:   PT Evaluation $PT Eval Low Complexity: 1 Low   PT General Charges $$ ACUTE PT VISIT: 1 Visit         [name], PT, DPT Acute Rehabilitation Services Office [phone]   [name] 10/20/2023, 8:47 AM
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
## 2023-10-20 NOTE — Evaluation (Signed)
 Occupational Therapy Evaluation/Discharge Patient Details Name: [name] MRN: [id number] DOB: 13-Oct-1956 Today's Date: 10/20/2023   History of Present Illness Pt is a 68 y.o. M who presents 10/18/2023 with nausea and vomiting. Found to be in persistent atrial fibrillation with RVR. Significant PMH: a-fib with RVR, alcohol  abuse, combined systolic diastolic CHF, liver cirrhosis, suicidal homicidal ideation, esophageal stricture, chronically dilated left pupil.   Clinical Impression   PTA, pt with housing insecurity, typically Independent with ADLs and mobility without AD though limits distances due to SOB/CHF. Pt presents now at baseline for ADLs/mobility. Pt standing in room on entry w/ plans to mobilize to bathroom w/ assist only needed for IV pole mgmt. Once back to bed after toileting, pt declined further ADL assessments or discussions regarding ADL/IADL mgmt. As pt appears at baseline, no further skilled OT services needed acutely or on DC. Recommend continued work with mobility specialists if pt to remain admitted.        If plan is discharge home, recommend the following:      Functional Status Assessment  Patient has not had a recent decline in their functional status  Equipment Recommendations  None recommended by OT    Recommendations for Other Services       Precautions / Restrictions Precautions Precautions: Other (comment) Precaution Comments: watch HR Restrictions Weight Bearing Restrictions Per Provider Order: No      Mobility Bed Mobility Overal bed mobility: Independent                  Transfers Overall transfer level: Independent Equipment used: None               General transfer comment: standing in room on entry      Balance Overall balance assessment: No apparent balance deficits (not formally assessed)                                         ADL either performed or assessed with clinical judgement   ADL Overall  ADL's : At baseline                                       General ADL Comments: pt standing in room on entry in attempts to go to bathroom though IV pole plugged in on other side of bed. OT assisted to bring IV pole within reach and pt able to mobilize to bathroom without AD or assistance other than IV pole. Pt able to manage urination standing at toilet and returned back to bed; declined further ADLs or activities and not forthcoming with further discussions with ADLs/IADL mgmt. pt denied any issues with SOB or obtaining medication since prior admission at [facility] Baseline Vision/History: 0 No visual deficits Ability to See in Adequate Light: 0 Adequate Patient Visual Report: No change from baseline Vision Assessment?: No apparent visual deficits     Perception         Praxis         Pertinent Vitals/Pain Pain Assessment Pain Assessment: No/denies pain     Extremity/Trunk Assessment Upper Extremity Assessment Upper Extremity Assessment: Overall WFL for tasks assessed;Right hand dominant   Lower Extremity Assessment Lower Extremity Assessment: Defer to PT evaluation   Cervical / Trunk Assessment Cervical / Trunk Assessment: Normal  Communication Communication Communication: No apparent difficulties   Cognition Arousal: Alert Behavior During Therapy: WFL for tasks assessed/performed Overall Cognitive Status: Within Functional Limits for tasks assessed                                       General Comments       Exercises     Shoulder Instructions      Home Living Family/patient expects to be discharged to:: Shelter/Homeless                                        Prior Functioning/Environment Prior Level of Function : Independent/Modified Independent             Mobility Comments: [place] [address]. Pt reports difficulty walking longer distances due to SOB and  falls down inclines. ADLs Comments: Gets prescriptions from [facility], panhandles for food/water         OT Problem List:        OT Treatment/Interventions:      OT Goals(Current goals can be found in the care plan section) Acute Rehab OT Goals Patient Stated Goal: have an ice cream OT Goal Formulation: All assessment and education complete, DC therapy  OT Frequency:      Co-evaluation              AM-PAC OT 6 Clicks Daily Activity     Outcome Measure Help from another person eating meals?: None Help from another person taking care of personal grooming?: None Help from another person toileting, which includes using toliet, bedpan, or urinal?: None Help from another person bathing (including washing, rinsing, drying)?: None Help from another person to put on and taking off regular upper body clothing?: None Help from another person to put on and taking off regular lower body clothing?: None 6 Click Score: 24   End of Session Nurse Communication: Mobility status  Activity Tolerance: Patient tolerated treatment well Patient left: in bed;with call bell/phone within reach  OT Visit Diagnosis: Unsteadiness on feet (R26.81)                Time: 8967-8957 OT Time Calculation (min): 10 min Charges:  OT General Charges $OT Visit: 1 Visit OT Evaluation $OT Eval Low Complexity: 1 Low  Mliss NOVAK, OTR/L Acute Rehab Services Office: [phone]   Mliss Getting 10/20/2023, 10:47 AM

## 2023-10-21 ENCOUNTER — Other Ambulatory Visit (HOSPITAL_COMMUNITY): Payer: Self-pay

## 2023-10-21 DIAGNOSIS — I4891 Unspecified atrial fibrillation: Secondary | ICD-10-CM | POA: Diagnosis not present

## 2023-10-21 LAB — COMPREHENSIVE METABOLIC PANEL
ALT: 19 U/L (ref 0–44)
AST: 22 U/L (ref 15–41)
Albumin: 2.7 g/dL — ABNORMAL LOW (ref 3.5–5.0)
Alkaline Phosphatase: 54 U/L (ref 38–126)
Anion gap: 9 (ref 5–15)
BUN: 15 mg/dL (ref 8–23)
CO2: 22 mmol/L (ref 22–32)
Calcium: 8.7 mg/dL — ABNORMAL LOW (ref 8.9–10.3)
Chloride: 107 mmol/L (ref 98–111)
Creatinine, Ser: 1.01 mg/dL (ref 0.61–1.24)
GFR, Estimated: >60 mL/min (ref >60)
Glucose, Bld: 93 mg/dL (ref 70–99)
Potassium: 4 mmol/L (ref 3.5–5.1)
Sodium: 138 mmol/L (ref 135–145)
Total Bilirubin: 0.8 mg/dL (ref 0.0–1.2)
Total Protein: 6.6 g/dL (ref 6.5–8.1)

## 2023-10-21 LAB — CBC WITH DIFFERENTIAL/PLATELET
Abs Immature Granulocytes: 0.03 10*3/uL (ref 0.00–0.07)
Basophils Absolute: 0 10*3/uL (ref 0.0–0.1)
Basophils Relative: 1 %
Eosinophils Absolute: 0.2 10*3/uL (ref 0.0–0.5)
Eosinophils Relative: 4 %
HCT: 40.8 % (ref 39.0–52.0)
Hemoglobin: 12.6 g/dL — ABNORMAL LOW (ref 13.0–17.0)
Immature Granulocytes: 0 %
Lymphocytes Relative: 37 %
Lymphs Abs: 2.5 10*3/uL (ref 0.7–4.0)
MCH: 24.3 pg — ABNORMAL LOW (ref 26.0–34.0)
MCHC: 30.9 g/dL (ref 30.0–36.0)
MCV: 78.8 fL — ABNORMAL LOW (ref 80.0–100.0)
Monocytes Absolute: 0.9 10*3/uL (ref 0.1–1.0)
Monocytes Relative: 13 %
Neutro Abs: 3 10*3/uL (ref 1.7–7.7)
Neutrophils Relative %: 45 %
Platelets: 144 10*3/uL — ABNORMAL LOW (ref 150–400)
RBC: 5.18 MIL/uL (ref 4.22–5.81)
RDW: 16.3 % — ABNORMAL HIGH (ref 11.5–15.5)
WBC: 6.7 10*3/uL (ref 4.0–10.5)
nRBC: 0 % (ref 0.0–0.2)

## 2023-10-21 LAB — MAGNESIUM: Magnesium: 1.8 mg/dL (ref 1.7–2.4)

## 2023-10-21 MED ORDER — FERROUS SULFATE 325 (65 FE) MG PO TABS
325.0000 mg | ORAL_TABLET | Freq: Every day | ORAL | 0 refills | Status: DC
  Filled 2023-10-21: qty 30, 30d supply, fill #0

## 2023-10-21 MED ORDER — MIRTAZAPINE 15 MG PO TABS
15.0000 mg | ORAL_TABLET | Freq: Every day | ORAL | 0 refills | Status: DC
  Filled 2023-10-21: qty 30, 30d supply, fill #0

## 2023-10-21 MED ORDER — GABAPENTIN 300 MG PO CAPS
300.0000 mg | ORAL_CAPSULE | Freq: Three times a day (TID) | ORAL | 0 refills | Status: DC
  Filled 2023-10-21: qty 90, 30d supply, fill #0

## 2023-10-21 MED ORDER — QUETIAPINE FUMARATE 25 MG PO TABS
25.0000 mg | ORAL_TABLET | Freq: Every day | ORAL | 0 refills | Status: DC
  Filled 2023-10-21: qty 30, 30d supply, fill #0

## 2023-10-21 MED ORDER — METOPROLOL TARTRATE 25 MG PO TABS
75.0000 mg | ORAL_TABLET | Freq: Two times a day (BID) | ORAL | 0 refills | Status: DC
  Filled 2023-10-21: qty 180, 30d supply, fill #0

## 2023-10-21 MED ORDER — FOLIC ACID 1 MG PO TABS
1.0000 mg | ORAL_TABLET | Freq: Every day | ORAL | 0 refills | Status: DC
  Filled 2023-10-21: qty 30, 30d supply, fill #0

## 2023-10-21 MED ORDER — CYANOCOBALAMIN 100 MCG PO TABS
100.0000 ug | ORAL_TABLET | Freq: Every day | ORAL | 0 refills | Status: DC
  Filled 2023-10-21: qty 30, 30d supply, fill #0

## 2023-10-21 MED ORDER — MAGNESIUM OXIDE -MG SUPPLEMENT 400 (240 MG) MG PO TABS
400.0000 mg | ORAL_TABLET | Freq: Every day | ORAL | 0 refills | Status: DC
  Filled 2023-10-21: qty 30, 30d supply, fill #0

## 2023-10-21 MED ORDER — ROSUVASTATIN CALCIUM 20 MG PO TABS
20.0000 mg | ORAL_TABLET | Freq: Every day | ORAL | 0 refills | Status: DC
  Filled 2023-10-21: qty 30, 30d supply, fill #0

## 2023-10-21 MED ORDER — APIXABAN 5 MG PO TABS
5.0000 mg | ORAL_TABLET | Freq: Two times a day (BID) | ORAL | 0 refills | Status: DC
  Filled 2023-10-21: qty 60, 30d supply, fill #0

## 2023-10-21 MED ORDER — FUROSEMIDE 40 MG PO TABS
40.0000 mg | ORAL_TABLET | Freq: Every day | ORAL | 0 refills | Status: DC
  Filled 2023-10-21: qty 30, 30d supply, fill #0

## 2023-10-21 MED ORDER — THIAMINE HCL 100 MG PO TABS
100.0000 mg | ORAL_TABLET | Freq: Every day | ORAL | 0 refills | Status: DC
  Filled 2023-10-21: qty 30, 30d supply, fill #0
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
## 2023-10-21 NOTE — Plan of Care (Signed)

## 2023-10-21 NOTE — Plan of Care (Signed)
  Problem: Education: Goal: Knowledge of General Education information will improve Description: Including pain rating scale, medication(s)/side effects and non-pharmacologic comfort measures 10/21/2023 1952 by Ainsley Motto, RN Outcome: Progressing 10/21/2023 0602 by Ainsley Motto, RN Outcome: Progressing   Problem: Health Behavior/Discharge Planning: Goal: Ability to manage health-related needs will improve 10/21/2023 1952 by Ainsley Motto, RN Outcome: Progressing 10/21/2023 0602 by Ainsley Motto, RN Outcome: Progressing   Problem: Clinical Measurements: Goal: Ability to maintain clinical measurements within normal limits will improve 10/21/2023 1952 by Ainsley Motto, RN Outcome: Progressing 10/21/2023 0602 by Ainsley Motto, RN Outcome: Progressing Goal: Will remain free from infection 10/21/2023 1952 by Ainsley Motto, RN Outcome: Progressing 10/21/2023 0602 by Ainsley Motto, RN Outcome: Progressing Goal: Diagnostic test results will improve 10/21/2023 1952 by Ainsley Motto, RN Outcome: Progressing 10/21/2023 0602 by Ainsley Motto, RN Outcome: Progressing Goal: Respiratory complications will improve 10/21/2023 1952 by Ainsley Motto, RN Outcome: Progressing 10/21/2023 0602 by Ainsley Motto, RN Outcome: Progressing Goal: Cardiovascular complication will be avoided 10/21/2023 1952 by Ainsley Motto, RN Outcome: Progressing 10/21/2023 0602 by Ainsley Motto, RN Outcome: Progressing   Problem: Activity: Goal: Risk for activity intolerance will decrease 10/21/2023 1952 by Ainsley Motto, RN Outcome: Progressing 10/21/2023 0602 by Ainsley Motto, RN Outcome: Progressing   Problem: Nutrition: Goal: Adequate nutrition will be maintained 10/21/2023 1952 by Ainsley Motto, RN Outcome: Progressing 10/21/2023 0602 by Ainsley Motto, RN Outcome: Progressing   Problem: Coping: Goal: Level of anxiety will decrease 10/21/2023  1952 by Ainsley Motto, RN Outcome: Progressing 10/21/2023 0602 by Ainsley Motto, RN Outcome: Progressing   Problem: Elimination: Goal: Will not experience complications related to bowel motility 10/21/2023 1952 by Ainsley Motto, RN Outcome: Progressing 10/21/2023 0602 by Ainsley Motto, RN Outcome: Progressing Goal: Will not experience complications related to urinary retention 10/21/2023 1952 by Ainsley Motto, RN Outcome: Progressing 10/21/2023 0602 by Ainsley Motto, RN Outcome: Progressing   Problem: Pain Management: Goal: General experience of comfort will improve 10/21/2023 1952 by Ainsley Motto, RN Outcome: Progressing 10/21/2023 0602 by Ainsley Motto, RN Outcome: Progressing   Problem: Safety: Goal: Ability to remain free from injury will improve 10/21/2023 1952 by Ainsley Motto, RN Outcome: Progressing 10/21/2023 0602 by Ainsley Motto, RN Outcome: Progressing   Problem: Skin Integrity: Goal: Risk for impaired skin integrity will decrease 10/21/2023 1952 by Ainsley Motto, RN Outcome: Progressing 10/21/2023 0602 by Ainsley Motto, RN Outcome: Progressing

## 2023-10-22 DIAGNOSIS — I4891 Unspecified atrial fibrillation: Secondary | ICD-10-CM | POA: Diagnosis not present

## 2023-10-22 LAB — VITAMIN B1: Vitamin B1 (Thiamine): 150.3 nmol/L (ref 66.5–200.0)

## 2023-10-22 MED ORDER — ORAL CARE MOUTH RINSE: 15.0000 mL | OROMUCOSAL | Status: DC | PRN

## 2023-10-22 NOTE — Discharge Summary (Signed)
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   Height:        General: Pt is alert, awake, not in acute distress Cardiovascular: RRR, S1/S2 +, no rubs, no gallops Respiratory: CTA bilaterally, no wheezing, no rhonchi Abdominal: Soft, NT, ND, bowel sounds + Extremities: no edema, no cyanosis    The results of significant diagnostics from this hospitalization (including imaging, microbiology, ancillary and laboratory) are listed below for reference.     Microbiology: Recent Results (from the past 240 hours)  Gastrointestinal Panel by PCR , Stool     Status: Abnormal   Collection Time: 10/18/23 10:30 PM   Specimen: Stool  Result Value Ref Range Status   Campylobacter species NOT DETECTED NOT DETECTED Final   Plesimonas shigelloides NOT DETECTED NOT DETECTED Final   Salmonella species NOT DETECTED NOT DETECTED Final   Yersinia enterocolitica NOT DETECTED NOT DETECTED Final   Vibrio species NOT DETECTED NOT DETECTED Final   Vibrio cholerae NOT DETECTED NOT DETECTED Final   Enteroaggregative E coli (EAEC) NOT DETECTED NOT DETECTED Final   Enteropathogenic E coli (EPEC) NOT DETECTED NOT DETECTED Final    Enterotoxigenic E coli (ETEC)  NOT DETECTED NOT DETECTED Final   Shiga like toxin producing E coli (STEC) NOT DETECTED NOT DETECTED Final   Shigella/Enteroinvasive E coli (EIEC) NOT DETECTED NOT DETECTED Final   Cryptosporidium NOT DETECTED NOT DETECTED Final   Cyclospora cayetanensis NOT DETECTED NOT DETECTED Final   Entamoeba histolytica NOT DETECTED NOT DETECTED Final   Giardia lamblia NOT DETECTED NOT DETECTED Final   Adenovirus F40/41 NOT DETECTED NOT DETECTED Final   Astrovirus NOT DETECTED NOT DETECTED Final   Norovirus GI/GII DETECTED (A) NOT DETECTED Final    Comment: RESULT CALLED TO, READ BACK BY AND VERIFIED WITH: AMARI HARRISON 10/20/23 1006 KLW    Rotavirus A NOT DETECTED NOT DETECTED Final   Sapovirus (I, II, IV, and V) NOT DETECTED NOT DETECTED Final    Comment: Performed at [facility], [address] [place], [place] [address]  C Difficile Quick Screen w PCR reflex     Status: None   Collection Time: 10/20/23  5:35 PM   Specimen: STOOL  Result Value Ref Range Status   C Diff antigen NEGATIVE NEGATIVE Final   C Diff toxin NEGATIVE NEGATIVE Final   C Diff interpretation No C. difficile detected.  Final    Comment: Performed at [facility], [address] [place], [place] [address]     Labs: BNP (last 3 results) Recent Labs    06/30/23 2200 10/05/23 1100 10/19/23 1548  BNP 87.8 157.8* 259.6*   Basic Metabolic Panel: Recent Labs  Lab 10/18/23 1807 10/18/23 2226 10/19/23 0546 10/20/23 0320 10/21/23 0237  NA 135  --  137 137 138  K 3.9  --  3.8 3.6 4.0  CL 104  --  106 106 107  CO2 19*  --  24 21* 22  GLUCOSE 100*  --  107* 104* 93  BUN 17  --  13 13 15   CREATININE 0.80  --  0.91 1.00 1.01  CALCIUM  8.2*  --  8.0* 8.5* 8.7*  MG  --  1.6* 1.8 1.6* 1.8  PHOS  --  2.7 2.9  --   --    Liver Function Tests: Recent Labs  Lab 10/18/23 1835 10/19/23 0546 10/20/23 0320 10/21/23 0237  AST 50* 40 27 22  ALT 31 25 22 19   ALKPHOS 64 51 53  54  BILITOT 1.5* 1.4* 1.0 0.8  PROT 7.9 6.2* 6.4* 6.6  ALBUMIN  3.1* 2.6* 2.6* 2.7*   Recent Labs  Lab 10/18/23 1835  LIPASE 38   Recent Labs  Lab 10/18/23 2226  AMMONIA 54*   CBC: Recent Labs  Lab 10/18/23 1807 10/19/23 0546 10/20/23 0320 10/21/23 0237  WBC 10.4 5.1 6.0 6.7  NEUTROABS  --   --  3.5 3.0  HGB 13.4 11.8* 11.4* 12.6*  HCT 42.6 37.7* 36.8* 40.8  MCV 79.0* 78.2* 80.2 78.8*  PLT 149* 104* 123* 144*   Cardiac Enzymes: Recent Labs  Lab 10/18/23 2226  CKTOTAL 51   BNP: Invalid input(s): POCBNP CBG: No results for input(s): GLUCAP in the last 168 hours. D-Dimer No results for input(s): DDIMER in the last 72 hours. Hgb A1c No results for input(s): HGBA1C in the last 72 hours. Lipid Profile No results for input(s): CHOL, HDL, LDLCALC, TRIG, CHOLHDL, LDLDIRECT in the last 72 hours. Thyroid  function studies Recent Labs    10/19/23 1548  T3FREE 2.4   Anemia work up No results for input(s): VITAMINB12, FOLATE, FERRITIN, TIBC, IRON, RETICCTPCT in the last 72 hours. Urinalysis  Component Value Date/Time   COLORURINE STRAW (A) 10/18/2023 2032   APPEARANCEUR CLEAR 10/18/2023 2032   LABSPEC 1.004 (L) 10/18/2023 2032   PHURINE 5.0 10/18/2023 2032   GLUCOSEU NEGATIVE 10/18/2023 2032   HGBUR NEGATIVE 10/18/2023 2032   HGBUR negative 05/12/2010 1022   BILIRUBINUR NEGATIVE 10/18/2023 2032   KETONESUR NEGATIVE 10/18/2023 2032   PROTEINUR NEGATIVE 10/18/2023 2032   UROBILINOGEN 1.0 03/17/2012 1530   NITRITE NEGATIVE 10/18/2023 2032   LEUKOCYTESUR NEGATIVE 10/18/2023 2032   Sepsis Labs Recent Labs  Lab 10/18/23 1807 10/19/23 0546 10/20/23 0320 10/21/23 0237  WBC 10.4 5.1 6.0 6.7   Microbiology Recent Results (from the past 240 hours)  Gastrointestinal Panel by PCR , Stool     Status: Abnormal   Collection Time: 10/18/23 10:30 PM   Specimen: Stool  Result Value Ref Range Status   Campylobacter species NOT  DETECTED NOT DETECTED Final   Plesimonas shigelloides NOT DETECTED NOT DETECTED Final   Salmonella species NOT DETECTED NOT DETECTED Final   Yersinia enterocolitica NOT DETECTED NOT DETECTED Final   Vibrio species NOT DETECTED NOT DETECTED Final   Vibrio cholerae NOT DETECTED NOT DETECTED Final   Enteroaggregative E coli (EAEC) NOT DETECTED NOT DETECTED Final   Enteropathogenic E coli (EPEC) NOT DETECTED NOT DETECTED Final   Enterotoxigenic E coli (ETEC) NOT DETECTED NOT DETECTED Final   Shiga like toxin producing E coli (STEC) NOT DETECTED NOT DETECTED Final   Shigella/Enteroinvasive E coli (EIEC) NOT DETECTED NOT DETECTED Final   Cryptosporidium NOT DETECTED NOT DETECTED Final   Cyclospora cayetanensis NOT DETECTED NOT DETECTED Final   Entamoeba histolytica NOT DETECTED NOT DETECTED Final   Giardia lamblia NOT DETECTED NOT DETECTED Final   Adenovirus F40/41 NOT DETECTED NOT DETECTED Final   Astrovirus NOT DETECTED NOT DETECTED Final   Norovirus GI/GII DETECTED (A) NOT DETECTED Final    Comment: RESULT CALLED TO, READ BACK BY AND VERIFIED WITH: AMARI HARRISON 10/20/23 1006 KLW    Rotavirus A NOT DETECTED NOT DETECTED Final   Sapovirus (I, II, IV, and V) NOT DETECTED NOT DETECTED Final    Comment: Performed at [facility], [address] [place], [place] [address]  C Difficile Quick Screen w PCR reflex     Status: None   Collection Time: 10/20/23  5:35 PM   Specimen: STOOL  Result Value Ref Range Status   C Diff antigen NEGATIVE NEGATIVE Final   C Diff toxin NEGATIVE NEGATIVE Final   C Diff interpretation No C. difficile detected.  Final    Comment: Performed at [facility], [address] [place], [place] [address]    FURTHER DISCHARGE INSTRUCTIONS:   Get Medicines reviewed and adjusted: Please take all your medications with you for your next visit with your Primary MD   Laboratory/radiological data: Please request your Primary MD to go over all hospital  tests and procedure/radiological results at the follow up, please ask your Primary MD to get all Hospital records sent to his/her office.   In some cases, they will be blood work, cultures and biopsy results pending at the time of your discharge. Please request that your primary care M.D. goes through all the records of your hospital data and follows up on these results.   Also Note the following: If you experience worsening of your admission symptoms, develop shortness of breath, life threatening emergency, suicidal or homicidal thoughts you must seek medical attention immediately by calling 911 or calling your MD immediately  if symptoms less severe.   You must read complete instructions/literature along with all the possible adverse reactions/side effects for all the Medicines you take and that have been prescribed to you. Take any new Medicines after you have completely understood and accpet all the possible adverse reactions/side effects.    Do not drive when taking Pain medications or sleeping medications (Benzodaizepines)   Do not take more than prescribed Pain, Sleep and Anxiety Medications. It is not advisable to combine anxiety,sleep and pain medications without talking with your primary care practitioner   Special Instructions: If you have smoked or chewed Tobacco  in the last 2 yrs please stop smoking, stop any regular Alcohol   and or any Recreational drug use.   Wear Seat belts while driving.   Please note: You were cared for by a hospitalist during your hospital stay. Once you are discharged, your primary care physician will handle any further medical issues. Please note that NO REFILLS for any discharge medications will be authorized once you are discharged, as it is imperative that you return to your primary care physician (or establish a relationship with a primary care physician if you do not have one) for your post hospital discharge needs so that they can reassess your need for  medications and monitor your lab values  Time coordinating discharge: Over 30 minutes  SIGNED:   Fredia Skeeter, MD  [facility] 10/22/2023, 11:16 AM *Please note that this is a verbal dictation therefore any spelling or grammatical errors are due to the Dragon Medical One system interpretation. If 7PM-7AM, please contact night-coverage [email or web address]

## 2023-10-22 NOTE — Progress Notes (Signed)
 AVS given to pt. Pt refused to go over the paperwork with this RN. TOC medications given to pt. Pt taken to exit via wheelchair by NT.
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
## 2023-10-26 NOTE — Progress Notes (Addendum)
 BH SW met with patient at bedside to offer supportive counseling and to discuss residential treatment options. Patient shared that during his last admission to [facility] unit his discharge plan was to admit to [facility]. Patient also shared that he did not follow through with this discharge plan. He is agreeable to go to any residential facility that will accept his insurance.   With patient's consent, BH SW faxed patient's clinical paperwork to the following residential facilities:  [facility] (Phone: [phone]) - DENIED, due to chronic suicidal ideation, per facility intake coordinator    [facility] [phone]) - pending review   [facility] [phone]) - BH SW spoke with Slater (intake coordinator) who confirmed receipt of referral fax. Patient needs to complete a phone screen by calling [phone]. BH SW provided pt with number to complete phone screen (1/10).   Update 10/27/23 at 1301: BH SW contacted [facility] and they confirmed that pt called and completed pre-screen. They also informed BH SW that they are not accepting any new patients for today through the weekend due to the expected inclement weather. Jane instructed BH SW to call on Monday to follow-up with bed decision (as this is the earliest they will be able to make a decision on bed offer).        Shaleta L. Elouise KEN LEANDER, [facility] Behavioral Health Social Worker Office: [phone] Ascom: [phone]
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
## 2023-10-31 ENCOUNTER — Other Ambulatory Visit (HOSPITAL_COMMUNITY): Payer: Self-pay

## 2023-11-02 ENCOUNTER — Ambulatory Visit: Payer: 59 | Attending: Physician Assistant | Admitting: Physician Assistant
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
## 2023-12-15 NOTE — Discharge Summary (Signed)
 Hospital Medicine Discharge Summary   Demographics: [name] y.o. Mar 07, 1956 MRN: [id number]    Extended Emergency Contact Information Primary Emergency Contact: Guinn,Sharon Mobile Phone: [phone] Relation: Other  Full Code  Admit Date: 12/12/2023                            Attending Physician: Elspeth Horde, MD Discharge Date: 12/15/2023  Primary Care Provider: Rojelio Jenkins Daring, FNP   [phone]  Consults during this admission: Consult Orders     None       Active & Resolved Diagnosis: Principal Problem (Resolved):   Pancreatitis without necrosis or infection Active Problems:   Restless legs   Alcoholic cirrhosis of liver with ascites (CMD) Resolved Problems:   Acute on chronic pancreatitis (CMD)   SBP (spontaneous bacterial peritonitis) (CMD)   Generalized abdominal pain   Urinary retention   Sepsis (CMD)   Constipation   Disposition: Patient discharged to Home in stable condition.   Discharge follow-up recommendations : See Hospital Course  Scheduled Future Appointments       Provider Department Dept Phone Center   01/15/2024 1:20 PM Helayne Quince [facility] [phone] [facility] Course: 68 y.o. male history of cirrhosis, chronic alcohol  abuse and atrial fibrillation comes in because of abdominal pain.   Patient had a CT abdomen pelvis with known cirrhosis and some portal hypertension and small ascites.  Given his clinical exam with marked abdominal tenderness and septic picture, he was treated for SBP.  Patient improved clinically following 3 days of IV antibiotics.  He will discharge to complete 1 more week of oral Augmentin .  He had bowel movements.  He was also found to have urinary retention and was started on Flomax  following Foley placement.  This was discontinued prior to discharge.  He will discharge on Flomax .  He was seen by PT and ambulated the hallway.  He  feels better and will pick up his meds from [facility] pharmacy.  He has a ride to take him home but I told him we could also provide will back to his address.    Wound / Incision Assessment: Refer to Chart Review and Media Tab for images if available.      Temp:  [98.2 F (36.8 C)-99.2 F (37.3 C)] 98.4 F (36.9 C) Heart Rate:  [78-90] 78 Resp:  [18-22] 19 BP: (133-155)/(56-70) 155/70      Discharge Medications     New Medications      Sig Disp Refill Start End  amoxicillin -pot clavulanate 875-125 mg per tablet Commonly known as: AUGMENTIN   Take 1 tablet by mouth 2 (two) times a day for 7 days.  14 tablet  0     tamsulosin  0.4 mg Cap Commonly known as: FLOMAX   Take 1 capsule (0.4 mg total) by mouth daily.  90 capsule  0  December 16, 2023    traMADoL  50 mg tablet Commonly known as: ULTRAM   Take 1 tablet (50 mg total) by mouth every 8 (eight) hours as needed for moderate pain (4-6) or severe pain (7-10).  10 tablet  0         Medications To Continue      Sig Disp Refill Start End  amLODIPine  10 mg tablet Commonly known as: NORVASC   Take 10 mg by mouth daily.   0     carboxymethylcellulose sodium 1 %  Drop  Administer 2 drops into affected eye(s) per protocol (see comments).   0     cetirizine 10 mg tablet Commonly known as: ZyrTEC  Take 10 mg by mouth daily.   0     cholecalciferol 25 mcg (1,000 unit) Cap  Take 25 mcg by mouth daily.   0     clobetasoL 0.05 % ointment Commonly known as: TEMOVATE  Apply 1 Application topically 2 (two) times a day.   0     cyanocobalamin  100 mcg tablet Commonly known as: VITAMIN B12  Take 1 tablet (100 mcg total) by mouth daily.  30 tablet  0     dilTIAZem  60 mg immediate-release tablet Commonly known as: CARDIZEM   Take 60 mg by mouth 3 (three) times a day.   0     docusate sodium  50 mg capsule Commonly known as: COLACE  Take 1 capsule by mouth every morning.   0     ferrous sulfate  325 mg (65 mg iron)  tablet  Take 1 tablet (325 mg total) by mouth daily with breakfast.  30 tablet  0     folic acid  1 mg tablet Commonly known as: FOLVITE   Take 1 tablet (1 mg total) by mouth daily.  30 tablet  0     furosemide  40 mg tablet Commonly known as: LASIX   Take 1 tablet (40 mg total) by mouth daily.  30 tablet  2     gabapentin  300 mg capsule Commonly known as: NEURONTIN   Take 1 capsule (300 mg total) by mouth 3 (three) times a day.  90 capsule  2     hydroCHLOROthiazide 12.5 mg tablet Commonly known as: HYDRODIURIL  Take 12.5 mg by mouth daily.   0     hydrOXYzine  pamoate 25 mg capsule Commonly known as: VISTARIL   Take 25 mg by mouth.   0     losartan  25 mg tablet Commonly known as: COZAAR   Take a HALF tablet (12.5 mg total) by mouth daily. (Take 0.5 tablets (12.5 mg total) by mouth daily.)  15 tablet  1     magnesium  oxide 400 mg (241 mg magnesium ) Tab  Take 1 tablet (400 mg total) by mouth daily before breakfast.  90 tablet  0     metoprolol  tartrate 100 mg tablet Commonly known as: LOPRESSOR   Take 1 tablet (100 mg total) by mouth 2 (two) times a day.  180 tablet  0     mirtazapine  15 mg tablet Commonly known as: REMERON   Take 15 mg by mouth at bedtime.   0     omeprazole  20 mg DR capsule Commonly known as: PriLOSEC  Take 1 capsule (20 mg total) by mouth in the morning.  30 capsule  2     ondansetron  4 mg disintegrating tablet Commonly known as: ZOFRAN -ODT  Dissolve 1 tablet (4 mg total) on tongue every 6 (six) hours as needed for nausea or vomiting.  28 tablet  0     pantoprazole  40 mg EC tablet Commonly known as: PROTONIX   Take 40 mg by mouth every morning before breakfast.   0     QUEtiapine  200 mg tablet Commonly known as: SEROquel   Take 200 mg by mouth daily.   0     * rosuvastatin  20 mg tablet Commonly known as: CRESTOR   Take 1 tablet (20 mg total) by mouth daily.  30 tablet  0     * Crestor  20 mg tablet Generic drug: rosuvastatin    Take  20 mg by mouth daily.   0     thiamine  100 mg tablet Commonly known as: VITAMIN B1  Take 1 tablet (100 mg total) by mouth daily.  30 tablet  2        * * There are duplicate medications prescribed to the patient          Discharge Orders     AH Ambulatory referral to Transition Clinic     AH Ambulatory referral to Transition Clinic     Ambulatory referral to PCP     CCM [facility] Coordination     Details:    OCCM Referral Reasons:  SDOH Transportation SDOH Food SDOH Financial Strain: Medication     Lifting Limits:     Details:    Lifting Limits: No lifting limits       Physical Therapy Recommendations: No skilled PT needs at discharge     Lab Results  Component Value Date/Time   HGB 12.2 12/15/2023 04:53 AM   HCT 36 12/15/2023 04:53 AM   WBC 10.01 12/15/2023 04:53 AM   PLT 108 (L) 12/15/2023 04:53 AM   Lab Results  Component Value Date/Time   NA 136 12/14/2023 03:39 AM   K 3.6 12/14/2023 03:39 AM   CREATININE 0.72 12/14/2023 03:39 AM   BUN 17 12/14/2023 03:39 AM   GLUCOSE 90 12/14/2023 03:39 AM    Pertinent Imaging: No orders to display    Electronically signed by: Elspeth Horde, MD 12/15/2023 11:57 AM   Time spent on discharge: 32 minutes *Some images could not be shown.

## 2023-12-27 IMAGING — CR DG CHEST 2V
2 series · 2 of 2 positions shown · non-contrast
Comparison: February 07, 2021

CLINICAL DATA: Chest pain and shortness of breath.

EXAM:
CHEST - 2 VIEW

[chest pa]
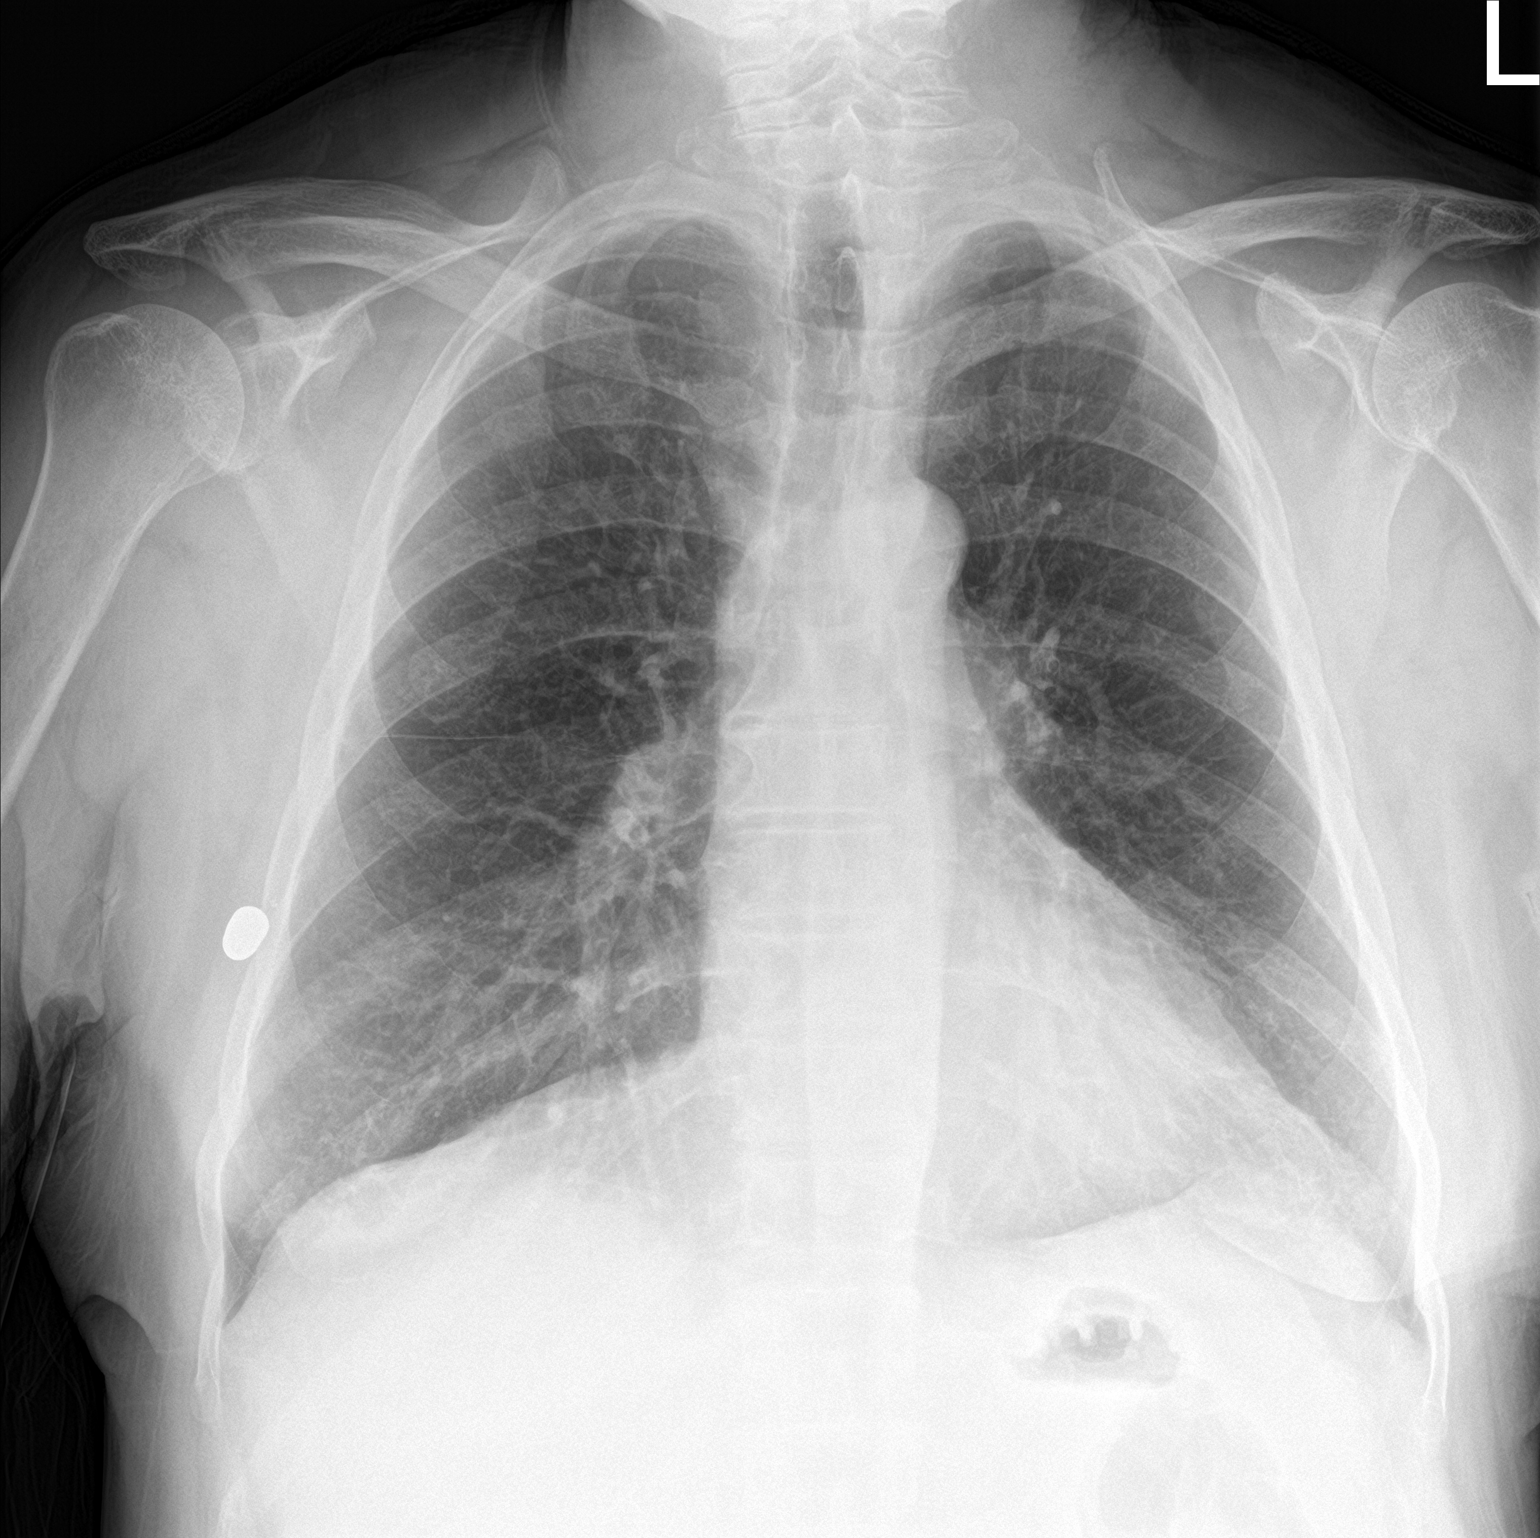

[chest lat]
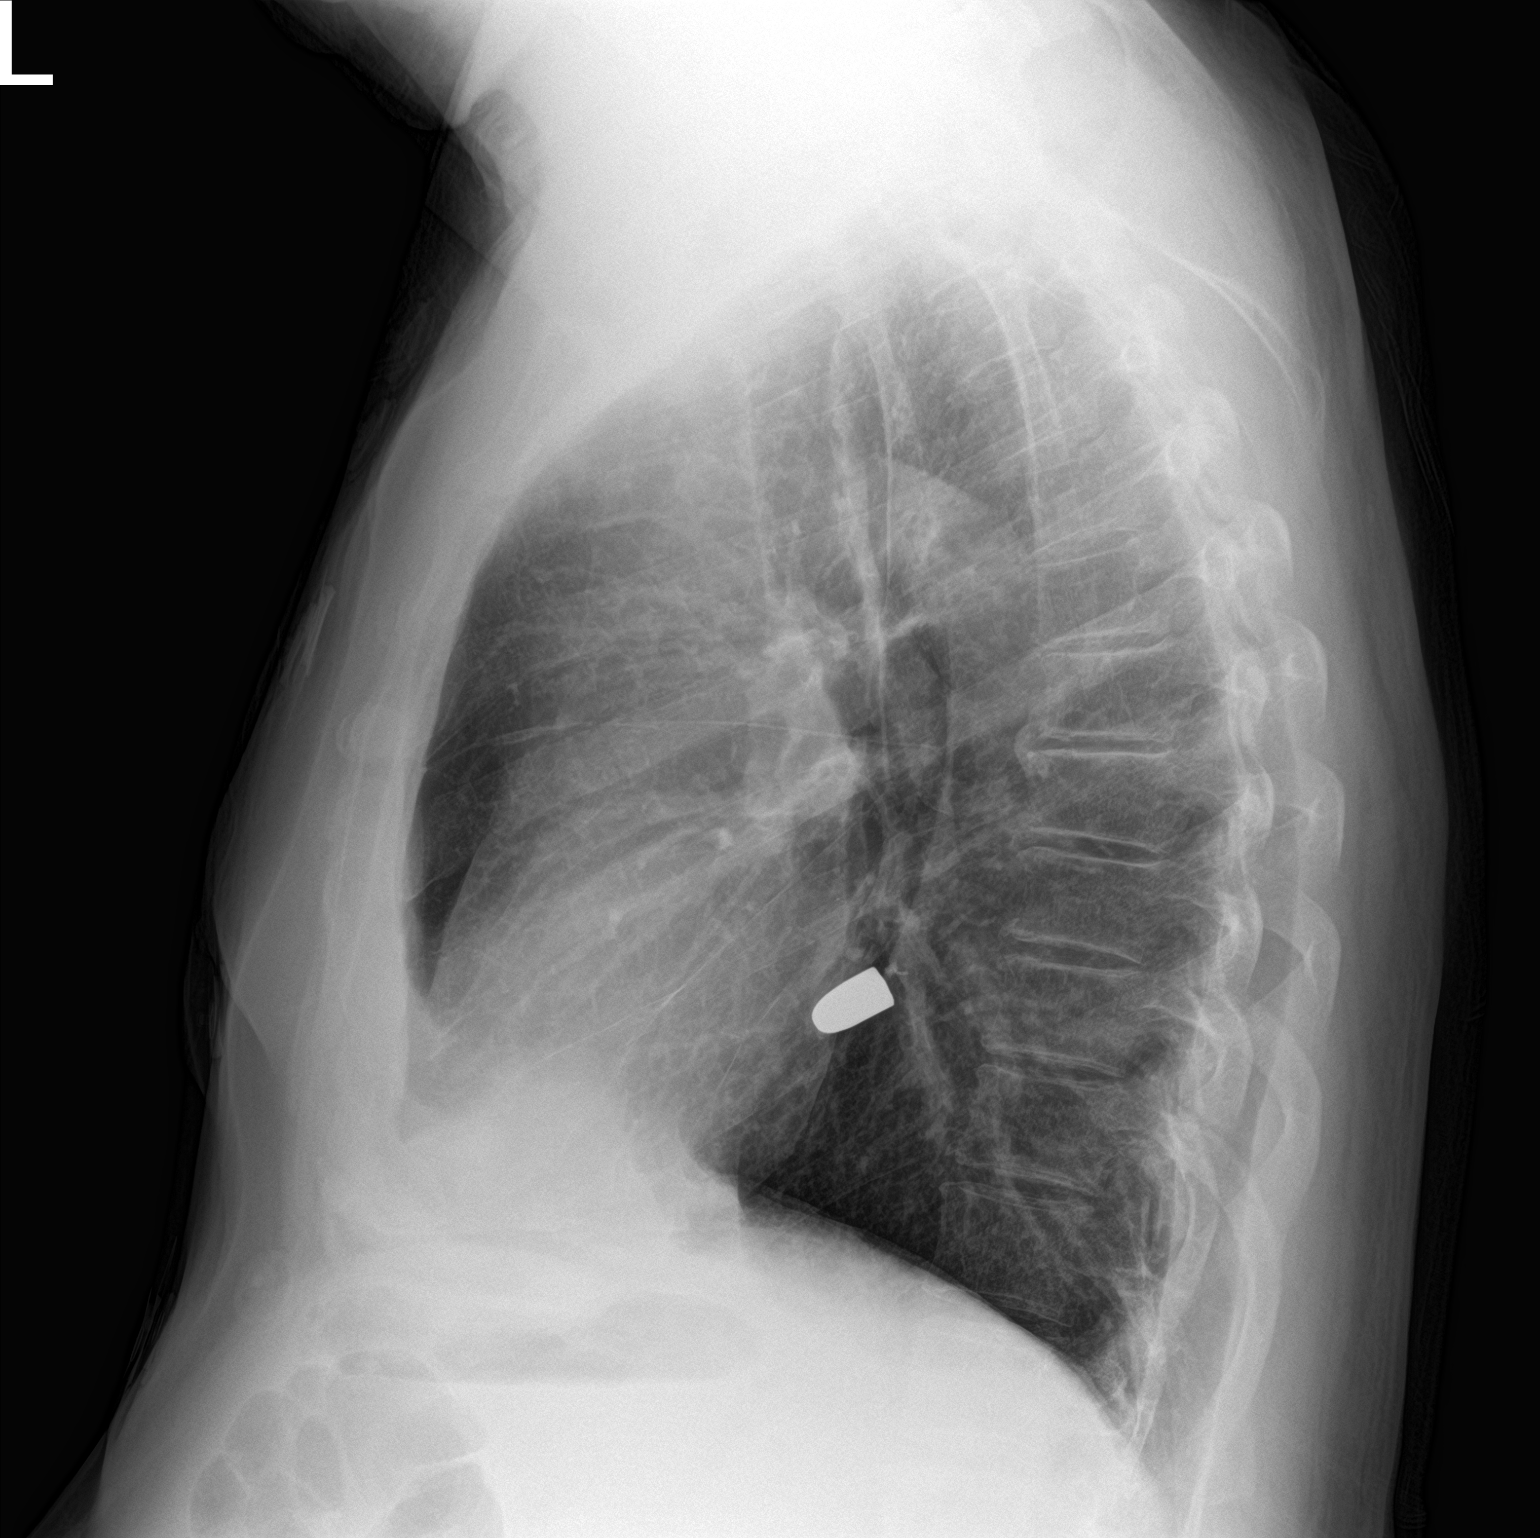

[2 of 2 positions shown; findings below may reference images not displayed]

FINDINGS: The heart size and mediastinal contours are within normal limits.
Both lungs are clear. The visualized skeletal structures are
unremarkable.
IMPRESSION: No active cardiopulmonary disease.

## 2024-02-02 IMAGING — CT CT ABD-PELV W/ CM
2 of 5 series · 12 of 46 positions shown, 14 images · IV contrast (APPLIED)
Comparison: 02/07/2021

CLINICAL DATA: Bowel obstruction suspected

EXAM:
CT ABDOMEN AND PELVIS WITH CONTRAST
TECHNIQUE: Multidetector CT imaging of the abdomen and pelvis was performed
using the standard protocol following bolus administration of
intravenous contrast.

[Series 3: abdomen 5.0 · axial · 0.62mm/px · z∈[+749,+1169]mm · 9 of 98 slices shown, 11 images]
[im 7/98  soft-tissue]
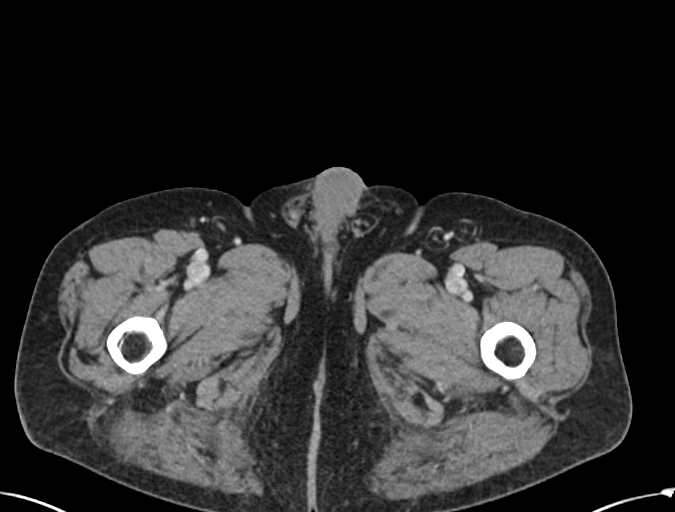
[im 7/98  bone]
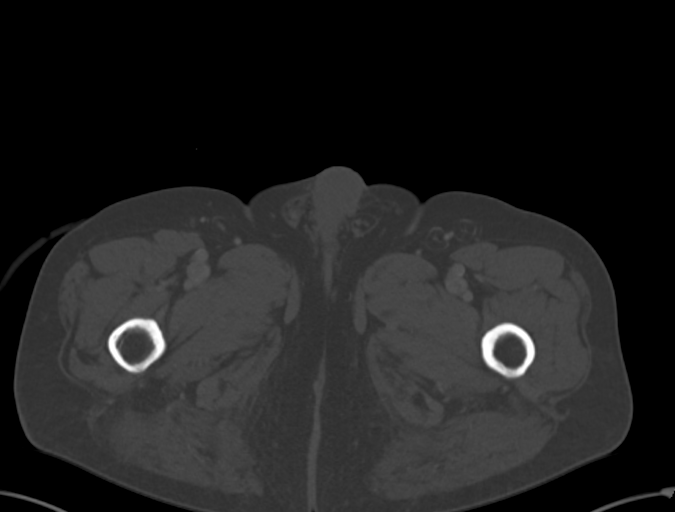
[im 20/98  soft-tissue]
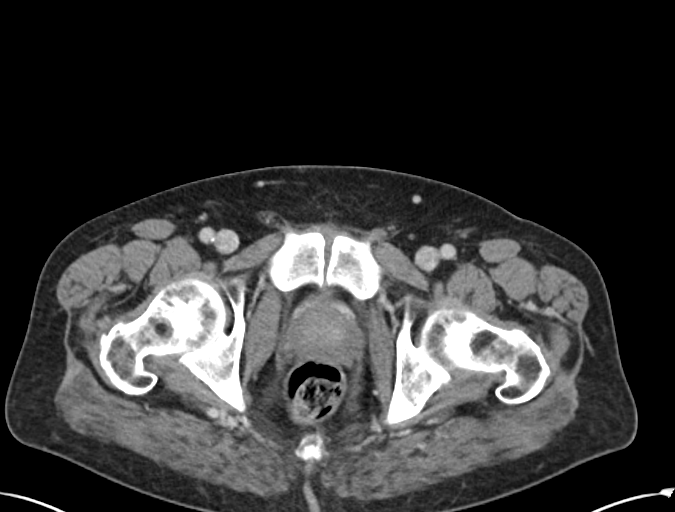
[im 26/98  soft-tissue]
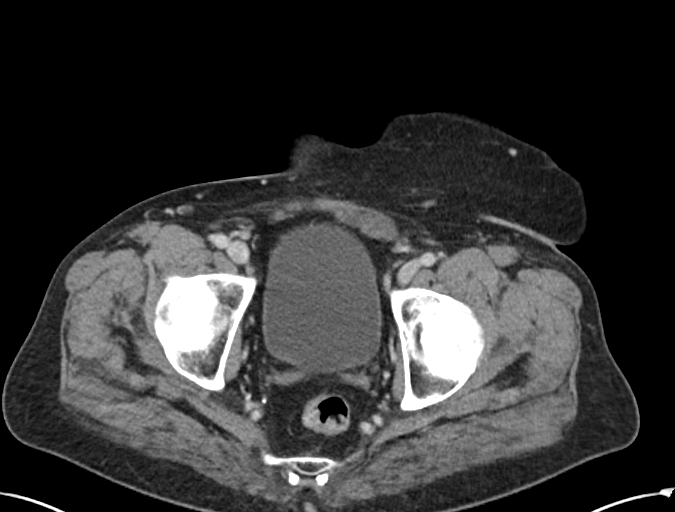
[im 39/98  soft-tissue]
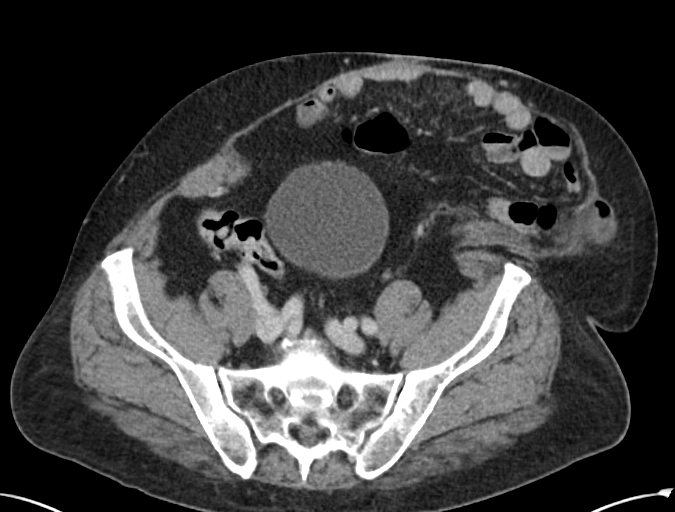
[im 52/98  soft-tissue]
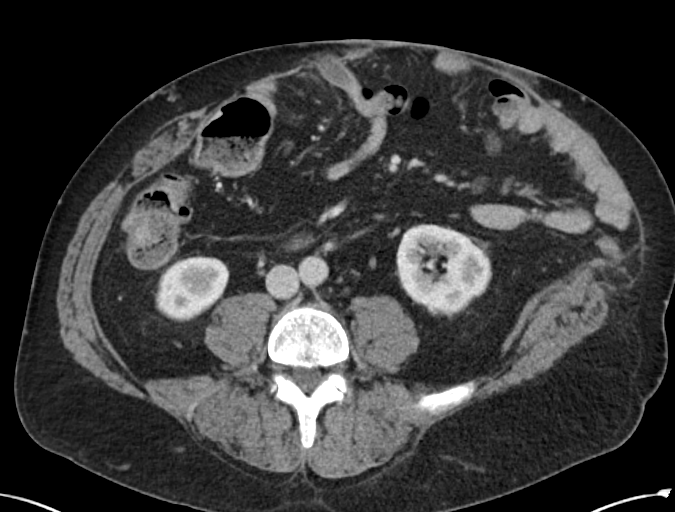
[im 59/98  soft-tissue]
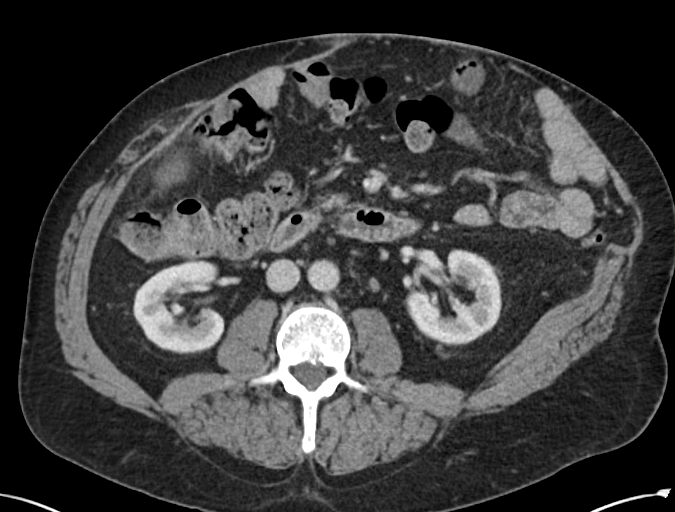
[im 72/98  soft-tissue]
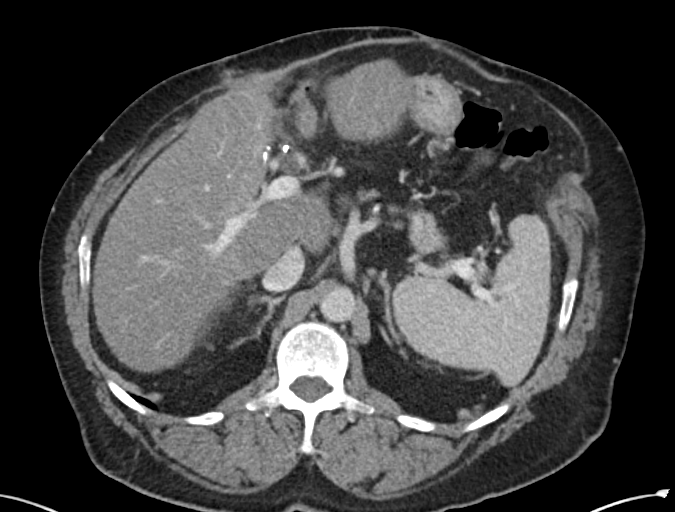
[im 78/98  soft-tissue]
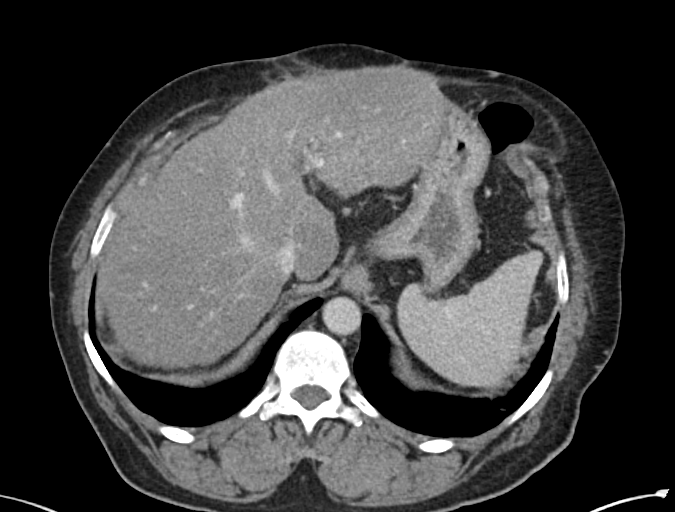
[im 91/98  soft-tissue]
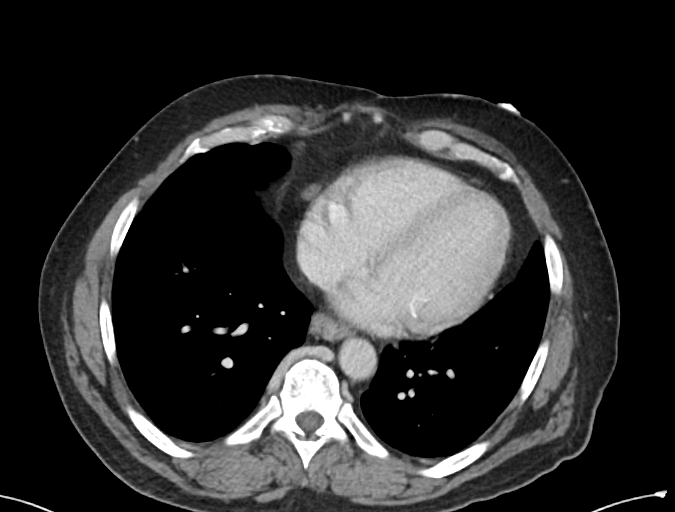
[im 91/98  bone]
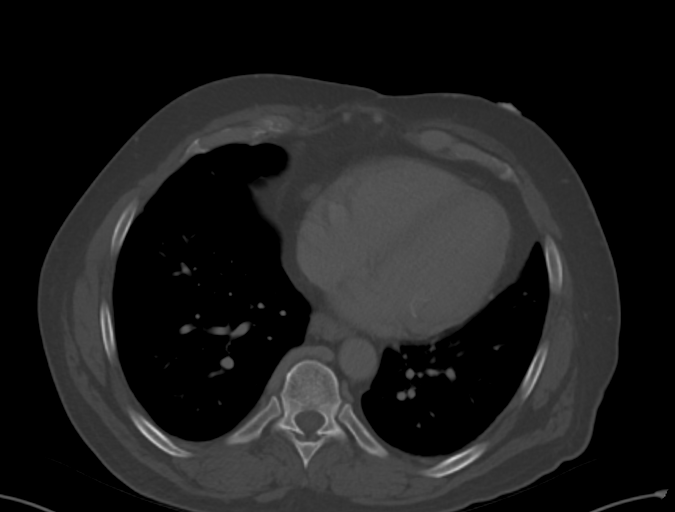

[Series 6: abdomen 3.0 mpr cor · coronal · 0.82mm/px · 3 of 106 slices shown]
[im 36/106  soft-tissue]
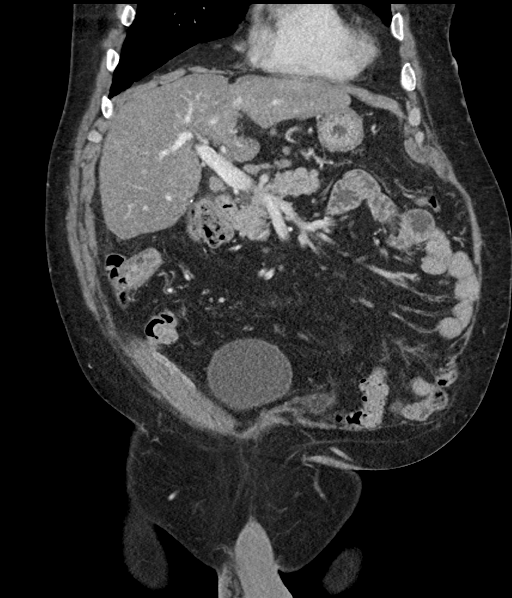
[im 47/106  soft-tissue]
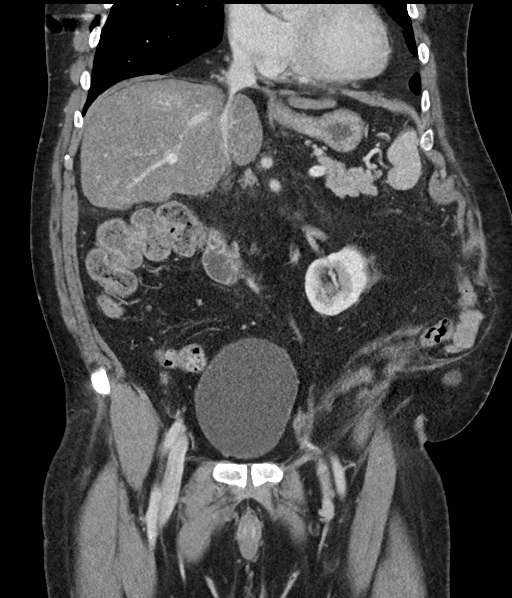
[im 59/106  soft-tissue]
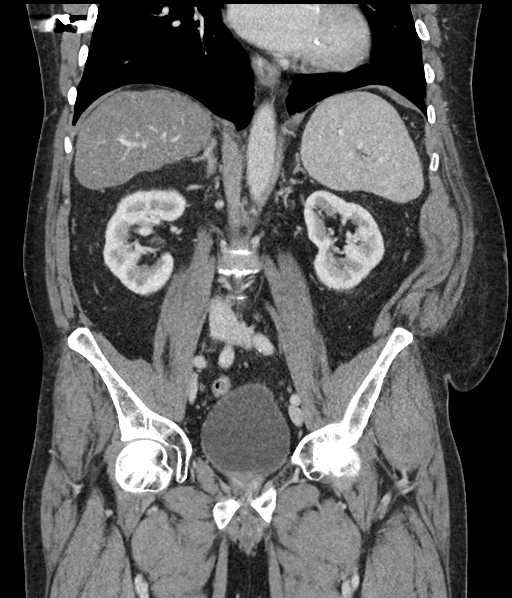

[12 of 46 positions shown; findings below may reference images not displayed]

RADIATION DOSE REDUCTION: This exam was performed according to the
departmental dose-optimization program which includes automated
exposure control, adjustment of the mA and/or kV according to
patient size and/or use of iterative reconstruction technique.

CONTRAST:  100mL OMNIPAQUE IOHEXOL 350 MG/ML SOLN
FINDINGS: Lower chest: No acute pleural or parenchymal lung disease.

Hepatobiliary: Diffuse hepatic steatosis, with lobularity of the
liver capsule suggesting underlying cirrhosis. No focal liver
abnormality. Cholecystectomy. No biliary duct dilation.

Pancreas: Unremarkable. No pancreatic ductal dilatation or
surrounding inflammatory changes.

Spleen: Normal in size without focal abnormality.

Adrenals/Urinary Tract: Adrenal glands are unremarkable. Kidneys are
normal, without renal calculi, focal lesion, or hydronephrosis.
Bladder is unremarkable.

Stomach/Bowel: No bowel obstruction or ileus. The appendix is not
identified and is likely surgically absent. No bowel wall thickening
or inflammatory change.

Vascular/Lymphatic: Aortic atherosclerosis. No enlarged abdominal or
pelvic lymph nodes.

Reproductive: Prostate is unremarkable.

Other: No free fluid or free intraperitoneal gas. There is chronic
wide diastasis of the rectus musculature. Within the diastasis,
there is a chronic focal left lower quadrant ventral hernia
containing portions of the small bowel, reference images 62 through
69 of series 3. No evidence of obstruction or incarceration.

Musculoskeletal: No acute or destructive bony lesions. Reconstructed
images demonstrate no additional findings.
IMPRESSION: 1. No bowel obstruction or ileus.
2. Chronic diastasis of the rectus musculature, with a chronic small
focal left lower quadrant ventral hernia. This hernia contains a
short segment of the small bowel, with no evidence of obstruction or
incarceration.
3. Cirrhosis and diffuse hepatic steatosis. No focal parenchymal
liver abnormality.
4.  Aortic Atherosclerosis ([id number]).

## 2024-03-05 NOTE — Progress Notes (Signed)
 Patient with worsening renal function and hyponatremia currently on diuretic therapy.  Our office was providing spironolactone  so we will discontinue this medication.  The cardiology office is providing and adjusting his Lasix .  It is not safe from GI standpoint to continue to prescribe diuretics given worsening renal function and hyponatremia.  Will defer further diuretic therapy to cardiology  Patient discussed with supervising gastroenterologist

## 2024-03-19 NOTE — Telephone Encounter (Signed)
 Pt aware and agreeable.

## 2024-04-11 ENCOUNTER — Encounter (HOSPITAL_COMMUNITY): Payer: Self-pay

## 2024-04-11 ENCOUNTER — Emergency Department (HOSPITAL_COMMUNITY)
Admission: EM | Admit: 2024-04-11 | Discharge: 2024-04-12 | Disposition: A | Discharge status: Home / Self Care | Attending: Emergency Medicine | Admitting: Emergency Medicine

## 2024-04-11 ENCOUNTER — Emergency Department (HOSPITAL_COMMUNITY)

## 2024-04-11 DIAGNOSIS — R600 Localized edema: Secondary | ICD-10-CM | POA: Diagnosis not present

## 2024-04-11 DIAGNOSIS — R2243 Localized swelling, mass and lump, lower limb, bilateral: Secondary | ICD-10-CM | POA: Diagnosis present

## 2024-04-11 LAB — BASIC METABOLIC PANEL WITH GFR
Anion gap: 16 — ABNORMAL HIGH (ref 5–15)
BUN: 8 mg/dL (ref 8–23)
CO2: 21 mmol/L — ABNORMAL LOW (ref 22–32)
Calcium: 9 mg/dL (ref 8.9–10.3)
Chloride: 98 mmol/L (ref 98–111)
Creatinine, Ser: 0.76 mg/dL (ref 0.61–1.24)
GFR, Estimated: >60 mL/min (ref >60)
Glucose, Bld: 90 mg/dL (ref 70–99)
Potassium: 3.6 mmol/L (ref 3.5–5.1)
Sodium: 135 mmol/L (ref 135–145)

## 2024-04-11 LAB — CBC
HCT: 41.2 % (ref 39.0–52.0)
Hemoglobin: 13.1 g/dL (ref 13.0–17.0)
MCH: 25.7 pg — ABNORMAL LOW (ref 26.0–34.0)
MCHC: 31.8 g/dL (ref 30.0–36.0)
MCV: 80.8 fL (ref 80.0–100.0)
Platelets: 157 10*3/uL (ref 150–400)
RBC: 5.1 MIL/uL (ref 4.22–5.81)
RDW: 18.1 % — ABNORMAL HIGH (ref 11.5–15.5)
WBC: 6.5 10*3/uL (ref 4.0–10.5)
nRBC: 0 % (ref 0.0–0.2)

## 2024-04-11 LAB — URINALYSIS, ROUTINE W REFLEX MICROSCOPIC
Bilirubin Urine: NEGATIVE
Glucose, UA: NEGATIVE mg/dL
Hgb urine dipstick: NEGATIVE
Ketones, ur: 5 mg/dL — AB
Leukocytes,Ua: NEGATIVE
Nitrite: NEGATIVE
Protein, ur: NEGATIVE mg/dL
Specific Gravity, Urine: 1.002 — ABNORMAL LOW (ref 1.005–1.030)
pH: 6 (ref 5.0–8.0)

## 2024-04-11 LAB — BRAIN NATRIURETIC PEPTIDE: B Natriuretic Peptide: 53.9 pg/mL (ref 0.0–100.0)

## 2024-04-11 NOTE — ED Provider Notes (Signed)
 [facility] Emergency Department Provider Note MRN:  [id number]  Arrival date & time: 04/12/24     Chief Complaint   Leg Swelling   History of Present Illness   [name] is a 68 y.o. year-old male presents to the ED with chief complaint of bilateral lower extremity swelling.  Reports hx of cirrhosis.  States that he has had worsening leg swelling and SOB over the past few days.  Reports associated dyspnea on exertion. Denies fevers, chills, or cough.  He also reports having an infection on his lower abdomen.  History provided by patient.   Review of Systems  Pertinent positive and negative review of systems noted in HPI.    Physical Exam   Vitals:   04/11/24 2155 04/12/24 0203  BP: (!) 167/78 (!) 150/62  Pulse: 89 92  Resp: 16 16  Temp: 97.7 F (36.5 C) 97.7 F (36.5 C)  SpO2: 96% 95%    CONSTITUTIONAL:  chronically ill-appearing, NAD NEURO:  Alert and oriented x 3, CN 3-12 grossly intact EYES:  eyes equal and reactive ENT/NECK:  Supple, no stridor  CARDIO:  normal rate, regular rhythm, appears well-perfused  PULM:  No respiratory distress, CTAB GI/GU:  mildly distended, but non tender MSK/SPINE:  No gross deformities, ambulatory, moves all extremities, 2+ pitting edema bilaterally SKIN:  no rash, small pressure ulcer on lower abdomen beneath pannus with surrounding erythema consistent with cellulitis   *Additional and/or pertinent findings included in MDM below  Diagnostic and Interventional Summary    EKG Interpretation Date/Time:    Ventricular Rate:    PR Interval:    QRS Duration:    QT Interval:    QTC Calculation:   R Axis:      Text Interpretation:         Labs Reviewed  CBC - Abnormal; Notable for the following components:      Result Value   MCH 25.7 (*)    RDW 18.1 (*)    All other components within normal limits  BASIC METABOLIC PANEL WITH GFR - Abnormal; Notable for the following components:   CO2 21 (*)     Anion gap 16 (*)    All other components within normal limits  HEPATIC FUNCTION PANEL - Abnormal; Notable for the following components:   Total Protein 8.2 (*)    AST 45 (*)    Total Bilirubin 1.7 (*)    Bilirubin, Direct 0.5 (*)    Indirect Bilirubin 1.2 (*)    All other components within normal limits  URINALYSIS, ROUTINE W REFLEX MICROSCOPIC - Abnormal; Notable for the following components:   Color, Urine STRAW (*)    Specific Gravity, Urine 1.002 (*)    Ketones, ur 5 (*)    All other components within normal limits  BRAIN NATRIURETIC PEPTIDE  TROPONIN I (HIGH SENSITIVITY)  TROPONIN I (HIGH SENSITIVITY)    DG Chest 2 View  Final Result      Medications  furosemide  (LASIX ) injection 40 mg (40 mg Intravenous Given 04/12/24 0155)     Procedures  /  Critical Care Procedures  ED Course and Medical Decision Making  I have reviewed the triage vital signs, the nursing notes, and pertinent available records from the EMR.  Social Determinants Affecting Complexity of Care: Patient has no clinically significant social determinants affecting this chief complaint..   ED Course: Clinical Course as of 04/12/24 0309  Fri Apr 12, 2024  0308 Basic metabolic panel(!) No significant electrolyte abnormality. [  RB]  0308 CBC(!) No leukocytosis or anemia [RB]  0308 Brain natriuretic peptide BNP is normal [RB]  0308 Troponin I (High Sensitivity) Trope negative, doubt ACS [RB]  0308 DG Chest 2 View No obvious evidence of volume overload on chest x-ray. [RB]    Clinical Course User Index [RB] Vicky Charleston, PA-C    Medical Decision Making Patient here with lower extremity swelling.  He states that he has had worsening swelling for the past 3 to 4 days.  He reports that he constantly feels out of breath.  He is not hypoxic.  He does not wear any oxygen at home.  He denies having chest pain.  He states that he is concerned about his liver and kidney function.  Patient's workup is  fairly reassuring.  I have given him a dose of IV Lasix .  He has urinated since he has been here.  I plan to discharge him with instructions to double his Lasix  for the next couple of days and follow-up with his PCP.  He is agreeable with this plan.  Amount and/or Complexity of Data Reviewed Labs: ordered. Decision-making details documented in ED Course. Radiology: ordered. Decision-making details documented in ED Course. ECG/medicine tests: ordered.  Risk Prescription drug management.         Consultants: No consultations were needed in caring for this patient.   Treatment and Plan: I considered admission due to patient's initial presentation, but after considering the examination and diagnostic results, patient will not require admission and can be discharged with outpatient follow-up.    Final Clinical Impressions(s) / ED Diagnoses     ICD-10-CM   1. Peripheral edema  R60.0       ED Discharge Orders     None         Discharge Instructions Discussed with and Provided to Patient:     Discharge Instructions      Please double your dose of Lasix  for the next 3 days.  Please follow-up with your regular doctor.  Return for new or worsening symptoms.       Vicky Charleston, PA-C 04/12/24 0309    Carita Senior, MD 04/12/24 [phone]

## 2024-04-11 NOTE — ED Triage Notes (Signed)
 Pt states that he has been having Leg swelling for the past 3-4 days. Pt reports that he has liver and kidney failure.

## 2024-04-11 NOTE — ED Provider Notes (Incomplete)
  [facility] Emergency Department Provider Note MRN:  [id number]  Arrival date & time: 04/11/24     Chief Complaint   Leg Swelling   History of Present Illness   [name] is a 68 y.o. year-old male presents to the ED with chief complaint of bilateral lower extremity swelling.  Reports hx of cirrhosis.  States that he has had worsening leg swelling and SOB over the past few days.  Reports associated dyspnea on exertion. Denies fevers, chills, or cough.  He also reports having an infection on his lower abdomen.  History provided by patient. {RB interpreter (Optional):27221}  Review of Systems  Pertinent positive and negative review of systems noted in HPI.    Physical Exam   Vitals:   04/11/24 2155  BP: (!) 167/78  Pulse: 89  Resp: 16  Temp: 97.7 F (36.5 C)  SpO2: 96%    CONSTITUTIONAL:  chronically ill-appearing, NAD NEURO:  Alert and oriented x 3, CN 3-12 grossly intact EYES:  eyes equal and reactive ENT/NECK:  Supple, no stridor  CARDIO:  normal rate, regular rhythm, appears well-perfused  PULM:  No respiratory distress, CTAB GI/GU:  mildly distended, but non tender MSK/SPINE:  No gross deformities, ambulatory, moves all extremities, 2+ pitting edema bilaterally SKIN:  no rash, small pressure ulcer on lower abdomen beneath pannus with surrounding erythema consistent with cellulitis   *Additional and/or pertinent findings included in MDM below  Diagnostic and Interventional Summary    EKG Interpretation Date/Time:    Ventricular Rate:    PR Interval:    QRS Duration:    QT Interval:    QTC Calculation:   R Axis:      Text Interpretation:         Labs Reviewed  CBC  BASIC METABOLIC PANEL WITH GFR  BRAIN NATRIURETIC PEPTIDE    No orders to display    Medications - No data to display   Procedures  /  Critical Care Procedures  ED Course and Medical Decision Making  I have reviewed the triage vital signs, the nursing  notes, and pertinent available records from the EMR.  Social Determinants Affecting Complexity of Care: Patient {rbSocial Determinants:27067}. {rbsocialsolutions:27068}  ED Course:    Medical Decision Making Amount and/or Complexity of Data Reviewed Labs: ordered.      {rbcpddx (Optional):29772:::1} {rbabdddx (Optional):29773:s::1}  Consultants: {rbconsultants:27072}   Treatment and Plan: {rbadmissionvdc:27069}  {rbattending:27073}  Final Clinical Impressions(s) / ED Diagnoses  No diagnosis found.  ED Discharge Orders     None         Discharge Instructions Discussed with and Provided to Patient:   Discharge Instructions   None

## 2024-04-12 ENCOUNTER — Telehealth (HOSPITAL_COMMUNITY): Payer: Self-pay | Admitting: Emergency Medicine

## 2024-04-12 DIAGNOSIS — R600 Localized edema: Secondary | ICD-10-CM | POA: Diagnosis not present

## 2024-04-12 LAB — HEPATIC FUNCTION PANEL
ALT: 29 U/L (ref 0–44)
AST: 45 U/L — ABNORMAL HIGH (ref 15–41)
Albumin: 3.7 g/dL (ref 3.5–5.0)
Alkaline Phosphatase: 73 U/L (ref 38–126)
Bilirubin, Direct: 0.5 mg/dL — ABNORMAL HIGH (ref 0.0–0.2)
Indirect Bilirubin: 1.2 mg/dL — ABNORMAL HIGH (ref 0.3–0.9)
Total Bilirubin: 1.7 mg/dL — ABNORMAL HIGH (ref 0.0–1.2)
Total Protein: 8.2 g/dL — ABNORMAL HIGH (ref 6.5–8.1)

## 2024-04-12 LAB — TROPONIN I (HIGH SENSITIVITY)
Troponin I (High Sensitivity): 7 ng/L (ref <18)
Troponin I (High Sensitivity): 7 ng/L (ref <18)

## 2024-04-12 MED ORDER — FUROSEMIDE 10 MG/ML IJ SOLN
40.0000 mg | INTRAMUSCULAR | Status: AC
  Administered 2024-04-12: 40 mg via INTRAVENOUS
  Filled 2024-04-12: qty 4

## 2024-04-12 MED ORDER — CEPHALEXIN 500 MG PO CAPS: 500.0000 mg | ORAL_CAPSULE | Freq: Two times a day (BID) | ORAL | 0 refills | Status: DC

## 2024-04-12 NOTE — Discharge Instructions (Addendum)
 Please double your dose of Lasix  for the next 3 days.  Please follow-up with your regular doctor.  Return for new or worsening symptoms.

## 2024-04-12 NOTE — ED Notes (Signed)
 Pt received belonging back from security

## 2024-04-12 NOTE — Telephone Encounter (Cosign Needed)
 Rx sent for keflex  for cellulitis on abdomen.

## 2024-04-22 IMAGING — DX DG PORTABLE PELVIS
1 series · 1 of 1 positions shown · non-contrast
Comparison: None Available.

CLINICAL DATA: Question concealing razor blade.

EXAM:
PORTABLE PELVIS 1-2 VIEWS

[pelvis ap]
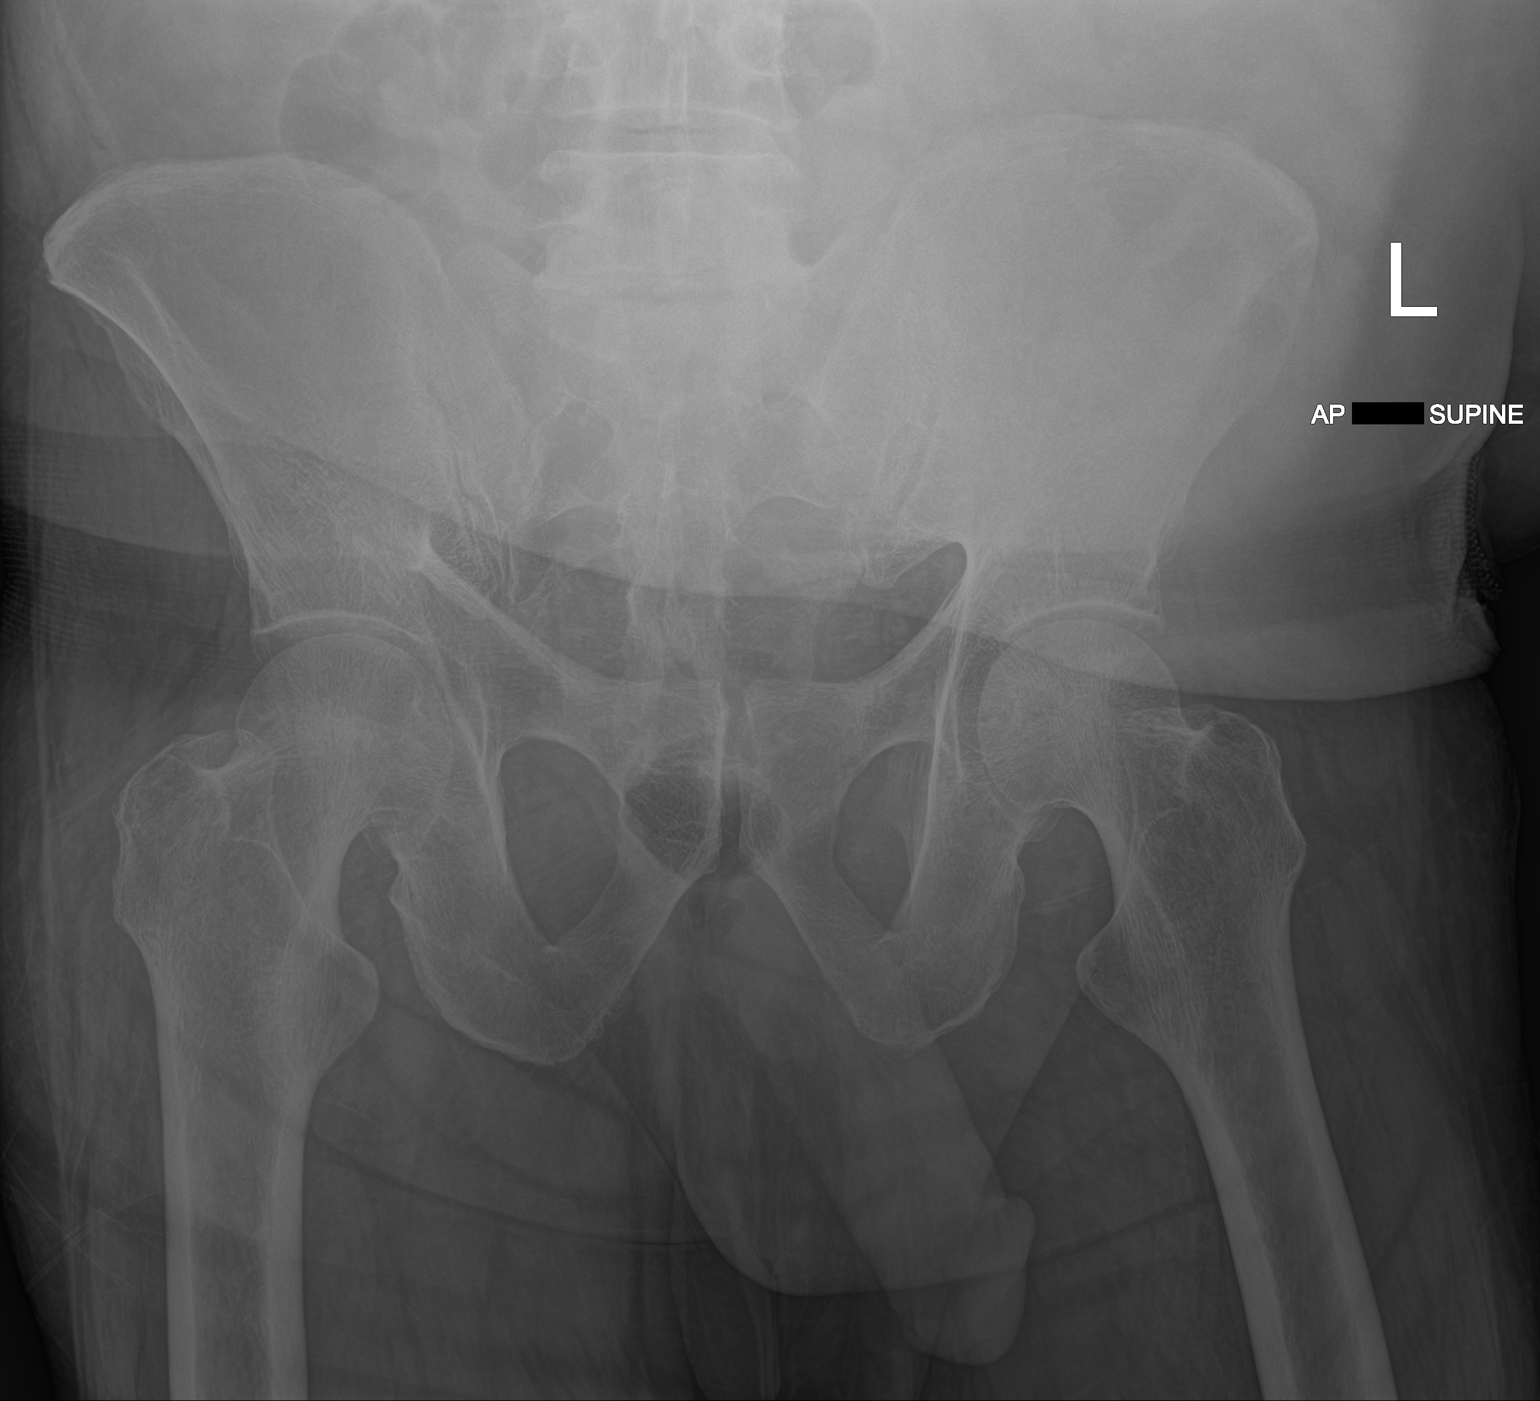

[1 of 1 positions shown; findings below may reference images not displayed]

FINDINGS: No radiopaque foreign body. The cortical margins of the bony pelvis
are intact. No fracture. Pubic symphysis and sacroiliac joints are
congruent. Both femoral heads are well-seated in the respective
acetabula.
IMPRESSION: No radiopaque foreign body. Unremarkable radiographic appearance of
the pelvis.

## 2024-05-01 ENCOUNTER — Emergency Department (HOSPITAL_COMMUNITY): Admitting: Registered Nurse

## 2024-05-01 ENCOUNTER — Encounter (HOSPITAL_COMMUNITY): Payer: Self-pay | Admitting: Surgery

## 2024-05-01 ENCOUNTER — Other Ambulatory Visit: Payer: Self-pay

## 2024-05-01 ENCOUNTER — Inpatient Hospital Stay (HOSPITAL_COMMUNITY)
Admission: EM | Admit: 2024-05-01 | Discharge: 2024-05-03 | DRG: 336 | Disposition: A | Discharge status: Other Acute Inpatient Hospital | Attending: Surgery | Admitting: Surgery

## 2024-05-01 ENCOUNTER — Emergency Department (HOSPITAL_COMMUNITY)

## 2024-05-01 ENCOUNTER — Encounter (HOSPITAL_COMMUNITY)
Admission: EM | Disposition: A | Discharge status: Other Acute Inpatient Hospital | Payer: Self-pay | Source: Home / Self Care

## 2024-05-01 DIAGNOSIS — Z5902 Unsheltered homelessness: Secondary | ICD-10-CM | POA: Diagnosis not present

## 2024-05-01 DIAGNOSIS — Z9889 Other specified postprocedural states: Secondary | ICD-10-CM

## 2024-05-01 DIAGNOSIS — S31139A Puncture wound of abdominal wall without foreign body, unspecified quadrant without penetration into peritoneal cavity, initial encounter: Secondary | ICD-10-CM

## 2024-05-01 DIAGNOSIS — Y907 Blood alcohol level of 200-239 mg/100 ml: Secondary | ICD-10-CM | POA: Diagnosis present

## 2024-05-01 DIAGNOSIS — T148XXA Other injury of unspecified body region, initial encounter: Principal | ICD-10-CM

## 2024-05-01 DIAGNOSIS — F1994 Other psychoactive substance use, unspecified with psychoactive substance-induced mood disorder: Secondary | ICD-10-CM | POA: Diagnosis present

## 2024-05-01 DIAGNOSIS — Z91018 Allergy to other foods: Secondary | ICD-10-CM

## 2024-05-01 DIAGNOSIS — S31623A Laceration with foreign body of abdominal wall, right lower quadrant with penetration into peritoneal cavity, initial encounter: Principal | ICD-10-CM | POA: Diagnosis present

## 2024-05-01 DIAGNOSIS — F1010 Alcohol abuse, uncomplicated: Secondary | ICD-10-CM | POA: Diagnosis present

## 2024-05-01 DIAGNOSIS — Z9151 Personal history of suicidal behavior: Secondary | ICD-10-CM

## 2024-05-01 DIAGNOSIS — Z634 Disappearance and death of family member: Secondary | ICD-10-CM | POA: Diagnosis not present

## 2024-05-01 DIAGNOSIS — B182 Chronic viral hepatitis C: Secondary | ICD-10-CM | POA: Diagnosis present

## 2024-05-01 DIAGNOSIS — E66813 Obesity, class 3: Secondary | ICD-10-CM

## 2024-05-01 DIAGNOSIS — F10129 Alcohol abuse with intoxication, unspecified: Secondary | ICD-10-CM | POA: Diagnosis present

## 2024-05-01 DIAGNOSIS — K439 Ventral hernia without obstruction or gangrene: Secondary | ICD-10-CM | POA: Diagnosis present

## 2024-05-01 DIAGNOSIS — Z6830 Body mass index (BMI) 30.0-30.9, adult: Secondary | ICD-10-CM | POA: Diagnosis not present

## 2024-05-01 DIAGNOSIS — M1990 Unspecified osteoarthritis, unspecified site: Secondary | ICD-10-CM | POA: Diagnosis present

## 2024-05-01 DIAGNOSIS — X781XXA Intentional self-harm by knife, initial encounter: Secondary | ICD-10-CM | POA: Diagnosis present

## 2024-05-01 DIAGNOSIS — I10 Essential (primary) hypertension: Secondary | ICD-10-CM | POA: Diagnosis present

## 2024-05-01 DIAGNOSIS — S31119A Laceration without foreign body of abdominal wall, unspecified quadrant without penetration into peritoneal cavity, initial encounter: Secondary | ICD-10-CM | POA: Diagnosis present

## 2024-05-01 DIAGNOSIS — S31109A Unspecified open wound of abdominal wall, unspecified quadrant without penetration into peritoneal cavity, initial encounter: Secondary | ICD-10-CM | POA: Diagnosis not present

## 2024-05-01 DIAGNOSIS — Z6836 Body mass index (BMI) 36.0-36.9, adult: Secondary | ICD-10-CM | POA: Diagnosis not present

## 2024-05-01 DIAGNOSIS — G8929 Other chronic pain: Secondary | ICD-10-CM | POA: Diagnosis present

## 2024-05-01 DIAGNOSIS — F333 Major depressive disorder, recurrent, severe with psychotic symptoms: Secondary | ICD-10-CM | POA: Diagnosis not present

## 2024-05-01 DIAGNOSIS — F339 Major depressive disorder, recurrent, unspecified: Secondary | ICD-10-CM | POA: Diagnosis present

## 2024-05-01 DIAGNOSIS — K660 Peritoneal adhesions (postprocedural) (postinfection): Secondary | ICD-10-CM | POA: Diagnosis present

## 2024-05-01 DIAGNOSIS — F22 Delusional disorders: Secondary | ICD-10-CM | POA: Diagnosis present

## 2024-05-01 DIAGNOSIS — Z23 Encounter for immunization: Secondary | ICD-10-CM

## 2024-05-01 DIAGNOSIS — Z555 Less than a high school diploma: Secondary | ICD-10-CM

## 2024-05-01 DIAGNOSIS — D696 Thrombocytopenia, unspecified: Secondary | ICD-10-CM | POA: Diagnosis not present

## 2024-05-01 DIAGNOSIS — F332 Major depressive disorder, recurrent severe without psychotic features: Secondary | ICD-10-CM | POA: Diagnosis not present

## 2024-05-01 DIAGNOSIS — F329 Major depressive disorder, single episode, unspecified: Secondary | ICD-10-CM | POA: Diagnosis not present

## 2024-05-01 DIAGNOSIS — R45851 Suicidal ideations: Secondary | ICD-10-CM | POA: Diagnosis present

## 2024-05-01 DIAGNOSIS — Z79899 Other long term (current) drug therapy: Secondary | ICD-10-CM | POA: Diagnosis not present

## 2024-05-01 DIAGNOSIS — F419 Anxiety disorder, unspecified: Secondary | ICD-10-CM | POA: Diagnosis present

## 2024-05-01 DIAGNOSIS — S36123A Laceration of gallbladder, initial encounter: Secondary | ICD-10-CM | POA: Diagnosis present

## 2024-05-01 DIAGNOSIS — L03311 Cellulitis of abdominal wall: Secondary | ICD-10-CM | POA: Diagnosis not present

## 2024-05-01 HISTORY — DX: Unspecified cirrhosis of liver: K74.60

## 2024-05-01 HISTORY — PX: LAPAROTOMY: SHX154

## 2024-05-01 HISTORY — DX: Unspecified atrial fibrillation: I48.91

## 2024-05-01 LAB — GLUCOSE, CAPILLARY
Glucose-Capillary: 139 mg/dL — ABNORMAL HIGH (ref 70–99)
Glucose-Capillary: 174 mg/dL — ABNORMAL HIGH (ref 70–99)

## 2024-05-01 LAB — COMPREHENSIVE METABOLIC PANEL WITH GFR
ALT: 39 U/L (ref 0–44)
AST: 67 U/L — ABNORMAL HIGH (ref 15–41)
Albumin: 2.9 g/dL — ABNORMAL LOW (ref 3.5–5.0)
Alkaline Phosphatase: 66 U/L (ref 38–126)
Anion gap: 10 (ref 5–15)
BUN: 5 mg/dL — ABNORMAL LOW (ref 8–23)
CO2: 21 mmol/L — ABNORMAL LOW (ref 22–32)
Calcium: 8.7 mg/dL — ABNORMAL LOW (ref 8.9–10.3)
Chloride: 107 mmol/L (ref 98–111)
Creatinine, Ser: 0.76 mg/dL (ref 0.61–1.24)
GFR, Estimated: >60 mL/min (ref >60)
Glucose, Bld: 103 mg/dL — ABNORMAL HIGH (ref 70–99)
Potassium: 3.5 mmol/L (ref 3.5–5.1)
Sodium: 138 mmol/L (ref 135–145)
Total Bilirubin: 0.8 mg/dL (ref 0.0–1.2)
Total Protein: 6.9 g/dL (ref 6.5–8.1)

## 2024-05-01 LAB — CBC
HCT: 36.6 % — ABNORMAL LOW (ref 39.0–52.0)
HCT: 37.3 % — ABNORMAL LOW (ref 39.0–52.0)
Hemoglobin: 11.8 g/dL — ABNORMAL LOW (ref 13.0–17.0)
Hemoglobin: 12 g/dL — ABNORMAL LOW (ref 13.0–17.0)
MCH: 25.8 pg — ABNORMAL LOW (ref 26.0–34.0)
MCH: 26.1 pg (ref 26.0–34.0)
MCHC: 32.2 g/dL (ref 30.0–36.0)
MCHC: 32.2 g/dL (ref 30.0–36.0)
MCV: 79.9 fL — ABNORMAL LOW (ref 80.0–100.0)
MCV: 81.1 fL (ref 80.0–100.0)
Platelets: 139 K/uL — ABNORMAL LOW (ref 150–400)
Platelets: 97 10*3/uL — ABNORMAL LOW (ref 150–400)
RBC: 4.58 MIL/uL (ref 4.22–5.81)
RBC: 4.6 MIL/uL (ref 4.22–5.81)
RDW: 19 % — ABNORMAL HIGH (ref 11.5–15.5)
RDW: 19 % — ABNORMAL HIGH (ref 11.5–15.5)
WBC: 4.3 10*3/uL (ref 4.0–10.5)
WBC: 5.5 K/uL (ref 4.0–10.5)
nRBC: 0 % (ref 0.0–0.2)
nRBC: 0 % (ref 0.0–0.2)

## 2024-05-01 LAB — I-STAT CHEM 8, ED
BUN: 4 mg/dL — ABNORMAL LOW (ref 8–23)
Calcium, Ion: 0.97 mmol/L — ABNORMAL LOW (ref 1.15–1.40)
Chloride: 108 mmol/L (ref 98–111)
Creatinine, Ser: 0.9 mg/dL (ref 0.61–1.24)
Glucose, Bld: 103 mg/dL — ABNORMAL HIGH (ref 70–99)
HCT: 38 % — ABNORMAL LOW (ref 39.0–52.0)
Hemoglobin: 12.9 g/dL — ABNORMAL LOW (ref 13.0–17.0)
Potassium: 3.5 mmol/L (ref 3.5–5.1)
Sodium: 139 mmol/L (ref 135–145)
TCO2: 20 mmol/L — ABNORMAL LOW (ref 22–32)

## 2024-05-01 LAB — SAMPLE TO BLOOD BANK

## 2024-05-01 LAB — BASIC METABOLIC PANEL WITH GFR
Anion gap: 13 (ref 5–15)
BUN: 5 mg/dL — ABNORMAL LOW (ref 8–23)
CO2: 21 mmol/L — ABNORMAL LOW (ref 22–32)
Calcium: 8.4 mg/dL — ABNORMAL LOW (ref 8.9–10.3)
Chloride: 105 mmol/L (ref 98–111)
Creatinine, Ser: 0.68 mg/dL (ref 0.61–1.24)
GFR, Estimated: >60 mL/min (ref >60)
Glucose, Bld: 250 mg/dL — ABNORMAL HIGH (ref 70–99)
Potassium: 3.7 mmol/L (ref 3.5–5.1)
Sodium: 139 mmol/L (ref 135–145)

## 2024-05-01 LAB — HIV ANTIBODY (ROUTINE TESTING W REFLEX): HIV Screen 4th Generation wRfx: NONREACTIVE

## 2024-05-01 LAB — PROTIME-INR
INR: 1.1 (ref 0.8–1.2)
Prothrombin Time: 15.3 s — ABNORMAL HIGH (ref 11.4–15.2)

## 2024-05-01 LAB — TYPE AND SCREEN
ABO/RH(D): O POS
Antibody Screen: NEGATIVE

## 2024-05-01 LAB — ABO/RH: ABO/RH(D): O POS

## 2024-05-01 LAB — ETHANOL: Alcohol, Ethyl (B): 203 mg/dL — ABNORMAL HIGH (ref <15)

## 2024-05-01 LAB — I-STAT CG4 LACTIC ACID, ED: Lactic Acid, Venous: 1.7 mmol/L (ref 0.5–1.9)

## 2024-05-01 SURGERY — LAPAROTOMY, EXPLORATORY
Anesthesia: General | Site: Abdomen

## 2024-05-01 MED ORDER — ADULT MULTIVITAMIN W/MINERALS CH
1.0000 | ORAL_TABLET | Freq: Every day | ORAL | Status: DC
  Administered 2024-05-01 – 2024-05-03 (×3): 1 via ORAL
  Filled 2024-05-01 (×3): qty 1

## 2024-05-01 MED ORDER — HYDRALAZINE HCL 20 MG/ML IJ SOLN: 10.0000 mg | INTRAMUSCULAR | Status: DC | PRN

## 2024-05-01 MED ORDER — OXYCODONE HCL 5 MG/5ML PO SOLN: 5.0000 mg | Freq: Once | ORAL | Status: DC | PRN

## 2024-05-01 MED ORDER — THIAMINE MONONITRATE 100 MG PO TABS
100.0000 mg | ORAL_TABLET | Freq: Every day | ORAL | Status: DC
  Administered 2024-05-01 – 2024-05-03 (×3): 100 mg via ORAL
  Filled 2024-05-01 (×3): qty 1

## 2024-05-01 MED ORDER — MIDAZOLAM HCL 2 MG/2ML IJ SOLN
INTRAMUSCULAR | Status: AC
  Filled 2024-05-01: qty 2

## 2024-05-01 MED ORDER — FENTANYL CITRATE (PF) 250 MCG/5ML IJ SOLN
INTRAMUSCULAR | Status: AC
  Filled 2024-05-01: qty 5

## 2024-05-01 MED ORDER — LACTATED RINGERS IV SOLN: INTRAVENOUS | Status: DC | PRN

## 2024-05-01 MED ORDER — SODIUM CHLORIDE 0.9 % IV SOLN: INTRAVENOUS | Status: AC

## 2024-05-01 MED ORDER — LACTATED RINGERS IV SOLN
INTRAVENOUS | Status: AC
  Administered 2024-05-01: 1000 mL via INTRAVENOUS

## 2024-05-01 MED ORDER — AMLODIPINE BESYLATE 10 MG PO TABS
10.0000 mg | ORAL_TABLET | Freq: Every day | ORAL | Status: DC
  Administered 2024-05-01 – 2024-05-03 (×3): 10 mg via ORAL
  Filled 2024-05-01 (×3): qty 1

## 2024-05-01 MED ORDER — DOCUSATE SODIUM 100 MG PO CAPS
100.0000 mg | ORAL_CAPSULE | Freq: Two times a day (BID) | ORAL | Status: DC
  Administered 2024-05-01 – 2024-05-03 (×5): 100 mg via ORAL
  Filled 2024-05-01 (×5): qty 1

## 2024-05-01 MED ORDER — ACETAMINOPHEN 10 MG/ML IV SOLN
INTRAVENOUS | Status: AC
  Filled 2024-05-01: qty 100

## 2024-05-01 MED ORDER — SUCCINYLCHOLINE CHLORIDE 200 MG/10ML IV SOSY
PREFILLED_SYRINGE | INTRAVENOUS | Status: DC | PRN
  Administered 2024-05-01: 140 mg via INTRAVENOUS

## 2024-05-01 MED ORDER — DEXAMETHASONE SODIUM PHOSPHATE 10 MG/ML IJ SOLN
INTRAMUSCULAR | Status: DC | PRN
  Administered 2024-05-01: 5 mg via INTRAVENOUS

## 2024-05-01 MED ORDER — ENOXAPARIN SODIUM 30 MG/0.3ML IJ SOSY
30.0000 mg | PREFILLED_SYRINGE | Freq: Two times a day (BID) | INTRAMUSCULAR | Status: DC
  Administered 2024-05-02: 30 mg via SUBCUTANEOUS
  Filled 2024-05-01: qty 0.3

## 2024-05-01 MED ORDER — POLYETHYLENE GLYCOL 3350 17 G PO PACK: 17.0000 g | PACK | Freq: Every day | ORAL | Status: DC | PRN

## 2024-05-01 MED ORDER — METOPROLOL TARTRATE 5 MG/5ML IV SOLN: 5.0000 mg | Freq: Four times a day (QID) | INTRAVENOUS | Status: DC | PRN

## 2024-05-01 MED ORDER — MELATONIN 3 MG PO TABS
3.0000 mg | ORAL_TABLET | Freq: Every evening | ORAL | Status: DC | PRN
  Administered 2024-05-01 – 2024-05-02 (×2): 3 mg via ORAL
  Filled 2024-05-01 (×2): qty 1

## 2024-05-01 MED ORDER — SODIUM CHLORIDE 0.9% IV SOLUTION: Freq: Once | INTRAVENOUS | Status: DC

## 2024-05-01 MED ORDER — TETANUS-DIPHTH-ACELL PERTUSSIS 5-2.5-18.5 LF-MCG/0.5 IM SUSY
0.5000 mL | PREFILLED_SYRINGE | Freq: Once | INTRAMUSCULAR | Status: AC
  Administered 2024-05-01: 0.5 mL via INTRAMUSCULAR
  Filled 2024-05-01: qty 0.5

## 2024-05-01 MED ORDER — FENTANYL CITRATE (PF) 100 MCG/2ML IJ SOLN
INTRAMUSCULAR | Status: AC
  Filled 2024-05-01: qty 2

## 2024-05-01 MED ORDER — METHOCARBAMOL 1000 MG/10ML IJ SOLN: 500.0000 mg | Freq: Three times a day (TID) | INTRAMUSCULAR | Status: DC

## 2024-05-01 MED ORDER — OXYCODONE HCL 5 MG PO TABS
5.0000 mg | ORAL_TABLET | ORAL | Status: DC | PRN
  Administered 2024-05-01: 10 mg via ORAL
  Administered 2024-05-01: 5 mg via ORAL
  Administered 2024-05-01 – 2024-05-03 (×6): 10 mg via ORAL
  Filled 2024-05-01 (×8): qty 2

## 2024-05-01 MED ORDER — MIRTAZAPINE 15 MG PO TABS
7.5000 mg | ORAL_TABLET | Freq: Every day | ORAL | Status: DC
  Administered 2024-05-01: 7.5 mg via ORAL
  Filled 2024-05-01: qty 1

## 2024-05-01 MED ORDER — LORAZEPAM 1 MG PO TABS
1.0000 mg | ORAL_TABLET | ORAL | Status: DC | PRN
  Administered 2024-05-02: 2 mg via ORAL
  Administered 2024-05-02 (×2): 1 mg via ORAL
  Administered 2024-05-03: 2 mg via ORAL
  Filled 2024-05-01: qty 1
  Filled 2024-05-01 (×2): qty 2
  Filled 2024-05-01: qty 1

## 2024-05-01 MED ORDER — METHOCARBAMOL 500 MG PO TABS
500.0000 mg | ORAL_TABLET | Freq: Three times a day (TID) | ORAL | Status: DC
  Administered 2024-05-01 – 2024-05-03 (×7): 500 mg via ORAL
  Filled 2024-05-01 (×7): qty 1

## 2024-05-01 MED ORDER — METRONIDAZOLE 500 MG/100ML IV SOLN
INTRAVENOUS | Status: AC
  Filled 2024-05-01: qty 100

## 2024-05-01 MED ORDER — ONDANSETRON HCL 4 MG/2ML IJ SOLN: 4.0000 mg | Freq: Four times a day (QID) | INTRAMUSCULAR | Status: DC | PRN

## 2024-05-01 MED ORDER — INSULIN ASPART 100 UNIT/ML IJ SOLN: 0.0000 [IU] | Freq: Three times a day (TID) | INTRAMUSCULAR | Status: DC

## 2024-05-01 MED ORDER — SODIUM CHLORIDE 0.9 % IV BOLUS
1000.0000 mL | Freq: Once | INTRAVENOUS | Status: AC
  Administered 2024-05-01: 1000 mL via INTRAVENOUS

## 2024-05-01 MED ORDER — FOLIC ACID 1 MG PO TABS
1.0000 mg | ORAL_TABLET | Freq: Every day | ORAL | Status: DC
  Administered 2024-05-01 – 2024-05-03 (×3): 1 mg via ORAL
  Filled 2024-05-01 (×3): qty 1

## 2024-05-01 MED ORDER — LORAZEPAM 2 MG/ML IJ SOLN
1.0000 mg | INTRAMUSCULAR | Status: DC | PRN
  Administered 2024-05-01: 2 mg via INTRAVENOUS
  Filled 2024-05-01: qty 1

## 2024-05-01 MED ORDER — HYDROMORPHONE HCL 1 MG/ML IJ SOLN
0.5000 mg | INTRAMUSCULAR | Status: DC | PRN
  Administered 2024-05-01 – 2024-05-02 (×4): 0.5 mg via INTRAVENOUS
  Filled 2024-05-01 (×4): qty 0.5

## 2024-05-01 MED ORDER — FENTANYL CITRATE (PF) 250 MCG/5ML IJ SOLN
INTRAMUSCULAR | Status: DC | PRN
  Administered 2024-05-01: 150 ug via INTRAVENOUS

## 2024-05-01 MED ORDER — ONDANSETRON HCL 4 MG/2ML IJ SOLN
INTRAMUSCULAR | Status: DC | PRN
  Administered 2024-05-01: 4 mg via INTRAVENOUS

## 2024-05-01 MED ORDER — CHLORHEXIDINE GLUCONATE CLOTH 2 % EX PADS
6.0000 | MEDICATED_PAD | Freq: Every day | CUTANEOUS | Status: DC
  Administered 2024-05-01 – 2024-05-03 (×3): 6 via TOPICAL

## 2024-05-01 MED ORDER — THIAMINE HCL 100 MG/ML IJ SOLN: 100.0000 mg | Freq: Every day | INTRAMUSCULAR | Status: DC

## 2024-05-01 MED ORDER — LIDOCAINE 2% (20 MG/ML) 5 ML SYRINGE
INTRAMUSCULAR | Status: DC | PRN
  Administered 2024-05-01: 60 mg via INTRAVENOUS

## 2024-05-01 MED ORDER — CEFAZOLIN SODIUM-DEXTROSE 2-4 GM/100ML-% IV SOLN
2.0000 g | Freq: Once | INTRAVENOUS | Status: AC
  Administered 2024-05-01: 2 g via INTRAVENOUS
  Filled 2024-05-01: qty 100

## 2024-05-01 MED ORDER — OXYCODONE HCL 5 MG PO TABS: 5.0000 mg | ORAL_TABLET | Freq: Once | ORAL | Status: DC | PRN

## 2024-05-01 MED ORDER — MIDAZOLAM HCL 2 MG/2ML IJ SOLN
INTRAMUSCULAR | Status: DC | PRN
  Administered 2024-05-01: 2 mg via INTRAVENOUS

## 2024-05-01 MED ORDER — ACETAMINOPHEN 10 MG/ML IV SOLN
INTRAVENOUS | Status: DC | PRN
  Administered 2024-05-01: 1000 mg via INTRAVENOUS

## 2024-05-01 MED ORDER — FENTANYL CITRATE (PF) 100 MCG/2ML IJ SOLN
25.0000 ug | INTRAMUSCULAR | Status: DC | PRN
  Administered 2024-05-01 (×2): 50 ug via INTRAVENOUS

## 2024-05-01 MED ORDER — ROCURONIUM BROMIDE 10 MG/ML (PF) SYRINGE
PREFILLED_SYRINGE | INTRAVENOUS | Status: DC | PRN
  Administered 2024-05-01: 50 mg via INTRAVENOUS

## 2024-05-01 MED ORDER — SUGAMMADEX SODIUM 200 MG/2ML IV SOLN
INTRAVENOUS | Status: DC | PRN
  Administered 2024-05-01 (×2): 100 mg via INTRAVENOUS

## 2024-05-01 MED ORDER — PROPOFOL 10 MG/ML IV BOLUS
INTRAVENOUS | Status: DC | PRN
  Administered 2024-05-01: 150 mg via INTRAVENOUS

## 2024-05-01 MED ORDER — 0.9 % SODIUM CHLORIDE (POUR BTL) OPTIME
TOPICAL | Status: DC | PRN
Start: 2024-05-01 — End: 2024-05-01
  Administered 2024-05-01: 1000 mL

## 2024-05-01 MED ORDER — METRONIDAZOLE 500 MG/100ML IV SOLN
INTRAVENOUS | Status: DC | PRN
Start: 2024-05-01 — End: 2024-05-01
  Administered 2024-05-01: 500 mg via INTRAVENOUS

## 2024-05-01 SURGICAL SUPPLY — 53 items
BAG COUNTER SPONGE SURGICOUNT (BAG) ×2 IMPLANT
BLADE CLIPPER SURG (BLADE) IMPLANT
BNDG GAUZE DERMACEA FLUFF 4 (GAUZE/BANDAGES/DRESSINGS) IMPLANT
CANISTER SUCTION 3000ML PPV (SUCTIONS) ×2 IMPLANT
CHLORAPREP W/TINT 26 (MISCELLANEOUS) ×2 IMPLANT
COVER SURGICAL LIGHT HANDLE (MISCELLANEOUS) ×2 IMPLANT
DERMABOND ADVANCED .7 DNX12 (GAUZE/BANDAGES/DRESSINGS) ×4 IMPLANT
DRAPE INCISE IOBAN 66X45 STRL (DRAPES) ×2 IMPLANT
DRAPE LAPAROSCOPIC ABDOMINAL (DRAPES) ×2 IMPLANT
DRAPE WARM FLUID 44X44 (DRAPES) ×2 IMPLANT
DRSG OPSITE POSTOP 4X10 (GAUZE/BANDAGES/DRESSINGS) IMPLANT
DRSG OPSITE POSTOP 4X8 (GAUZE/BANDAGES/DRESSINGS) IMPLANT
ELECT BLADE 6.5 EXT (BLADE) IMPLANT
ELECT CAUTERY BLADE 6.4 (BLADE) ×2 IMPLANT
ELECTRODE BLDE 4.0 EZ CLN MEGD (MISCELLANEOUS) IMPLANT
ELECTRODE REM PT RTRN 9FT ADLT (ELECTROSURGICAL) ×2 IMPLANT
GAUZE PAD ABD 8X10 STRL (GAUZE/BANDAGES/DRESSINGS) IMPLANT
GAUZE SPONGE 4X4 12PLY STRL (GAUZE/BANDAGES/DRESSINGS) IMPLANT
GLOVE BIOGEL PI IND STRL 6 (GLOVE) ×2 IMPLANT
GLOVE BIOGEL PI IND STRL 6.5 (GLOVE) IMPLANT
GLOVE BIOGEL PI IND STRL 7.0 (GLOVE) IMPLANT
GLOVE BIOGEL PI IND STRL 7.5 (GLOVE) IMPLANT
GLOVE BIOGEL PI IND STRL 8 (GLOVE) IMPLANT
GLOVE BIOGEL PI MICRO STRL 5.5 (GLOVE) ×2 IMPLANT
GLOVE SURG ORTHO 8.0 STRL STRW (GLOVE) IMPLANT
GLOVE SURG SS PI 6.5 STRL IVOR (GLOVE) IMPLANT
GOWN STRL REUS W/ TWL LRG LVL3 (GOWN DISPOSABLE) ×4 IMPLANT
GOWN STRL SURGICAL XL XLNG (GOWN DISPOSABLE) IMPLANT
HANDLE SUCTION POOLE (INSTRUMENTS) ×2 IMPLANT
KIT BASIN OR (CUSTOM PROCEDURE TRAY) ×2 IMPLANT
KIT TURNOVER KIT B (KITS) ×2 IMPLANT
LIGASURE IMPACT 36 18CM CVD LR (INSTRUMENTS) IMPLANT
NS IRRIG 1000ML POUR BTL (IV SOLUTION) ×4 IMPLANT
PACK GENERAL/GYN (CUSTOM PROCEDURE TRAY) ×2 IMPLANT
PAD ARMBOARD POSITIONER FOAM (MISCELLANEOUS) ×2 IMPLANT
PENCIL SMOKE EVACUATOR (MISCELLANEOUS) ×2 IMPLANT
SLEEVE SUCTION CATH 165 (SLEEVE) ×2 IMPLANT
SOLUTION PREP PVP 2OZ (MISCELLANEOUS) IMPLANT
SPECIMEN JAR LARGE (MISCELLANEOUS) IMPLANT
SPONGE T-LAP 18X18 ~~LOC~~+RFID (SPONGE) IMPLANT
STAPLER SKIN PROX 35W (STAPLE) IMPLANT
SUT MNCRL AB 4-0 PS2 18 (SUTURE) ×4 IMPLANT
SUT PDS AB 0 CT1 27 (SUTURE) IMPLANT
SUT PDS AB 1 TP1 96 (SUTURE) ×4 IMPLANT
SUT SILK 2 0 SH CR/8 (SUTURE) ×2 IMPLANT
SUT SILK 2 0 TIES 10X30 (SUTURE) ×2 IMPLANT
SUT SILK 3 0 SH CR/8 (SUTURE) ×2 IMPLANT
SUT SILK 3 0 TIES 10X30 (SUTURE) ×2 IMPLANT
SUT VIC AB 3-0 SH 18 (SUTURE) IMPLANT
SUT VIC AB 3-0 SH 27XBRD (SUTURE) ×4 IMPLANT
TOWEL GREEN STERILE (TOWEL DISPOSABLE) ×2 IMPLANT
TRAY FOLEY MTR SLVR 16FR STAT (SET/KITS/TRAYS/PACK) IMPLANT
YANKAUER SUCT BULB TIP NO VENT (SUCTIONS) IMPLANT

## 2024-05-01 NOTE — Progress Notes (Signed)
 Safety sitter in room, pt environment safe. Pt has numerous requests.... Cream for under arm (CLOBETASOL),  reports itching on forehead (request-hydrocortisone cream),  has inquired about the baclofen, which he takes TID for hiccups.   Sent home meds to main pharmacy for holding. This includes sildenafil.  NEED DESIGNATED VISITOR INFO   Wound care performed
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
## 2024-05-01 NOTE — Transfer of Care (Signed)
 Immediate Anesthesia Transfer of Care Note  Patient: [name]  Procedure(s) Performed: LAPAROTOMY, EXPLORATORY  Patient Location: PACU  Anesthesia Type:General  Level of Consciousness: awake, alert , and oriented  Airway & Oxygen Therapy: Patient Spontanous Breathing  Post-op Assessment: Report given to RN and Post -op Vital signs reviewed and stable  Post vital signs: Reviewed and stable  Last Vitals:  Vitals Value Taken Time  BP 127/83 05/01/24 02:15  Temp 36.7 C 05/01/24 02:15  Pulse 81 05/01/24 02:18  Resp 17 05/01/24 02:18  SpO2 90 % 05/01/24 02:18  Vitals shown include unfiled device data.  Last Pain:  Vitals:   05/01/24 0215  PainSc: 3          Complications: No notable events documented.

## 2024-05-01 NOTE — Progress Notes (Signed)
 Transition of Care [facility]) - CAGE-AID Screening   Patient Details  Name: [name] MRN: [id number] Date of Birth: 1956/04/23  [facility]) CM/SW Contact:    Bernardino Mayotte, RN Phone Number: 05/01/2024, 4:47 AM   Clinical Narrative:  Patient endorses use of alcohol and illicit drugs. Patient denies need of resources at this time.  CAGE-AID Screening:    Have You Ever Felt You Ought to Cut Down on Your Drinking or Drug Use?: No Have People Annoyed You By Critizing Your Drinking Or Drug Use?: No Have You Felt Bad Or Guilty About Your Drinking Or Drug Use?: No Have You Ever Had a Drink or Used Drugs First Thing In The Morning to Steady Your Nerves or to Get Rid of a Hangover?: No CAGE-AID Score: 0  Substance Abuse Education Offered: No
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
## 2024-05-01 NOTE — Anesthesia Procedure Notes (Signed)
 Arterial Line Insertion Start/End7/16/2025 12:58 AM Performed by: Lansing Hildegard NOVAK, CRNA, CRNA  Patient location: OR. Preanesthetic checklist: patient identified, IV checked, site marked, risks and benefits discussed, surgical consent, monitors and equipment checked, pre-op evaluation, timeout performed and anesthesia consent Patient sedated Right, radial was placed Catheter size: 20 G Maximum sterile barriers used   Attempts: 1 Procedure performed without using ultrasound guided technique. Following insertion, dressing applied and Biopatch. Post procedure assessment: normal  Patient tolerated the procedure well with no immediate complications.

## 2024-05-01 NOTE — Anesthesia Procedure Notes (Signed)
 Procedure Name: Intubation Date/Time: 05/01/2024 12:51 AM  Performed by: Verner Warren BROCKS., CRNAPre-anesthesia Checklist: Patient identified, Emergency Drugs available, Suction available, Patient being monitored and Timeout performed Patient Re-evaluated:Patient Re-evaluated prior to induction Oxygen Delivery Method: Circle system utilized Preoxygenation: Pre-oxygenation with 100% oxygen Induction Type: IV induction, Rapid sequence and Cricoid Pressure applied Laryngoscope Size: Mac and 4 Grade View: Grade I Tube type: Oral Tube size: 7.5 mm Number of attempts: 1 Airway Equipment and Method: Stylet Placement Confirmation: ETT inserted through vocal cords under direct vision, positive ETCO2 and breath sounds checked- equal and bilateral Secured at: 23 cm Tube secured with: Tape Dental Injury: Teeth and Oropharynx as per pre-operative assessment

## 2024-05-01 NOTE — Plan of Care (Signed)
  Problem: Education: Goal: Knowledge of General Education information will improve Description: Including pain rating scale, medication(s)/side effects and non-pharmacologic comfort measures Outcome: Progressing   Problem: Clinical Measurements: Goal: Ability to maintain clinical measurements within normal limits will improve Outcome: Progressing Goal: Will remain free from infection Outcome: Progressing Goal: Diagnostic test results will improve Outcome: Progressing Goal: Respiratory complications will improve Outcome: Progressing Goal: Cardiovascular complication will be avoided Outcome: Progressing   Problem: Activity: Goal: Risk for activity intolerance will decrease Outcome: Progressing   Problem: Coping: Goal: Level of anxiety will decrease Outcome: Progressing   Problem: Pain Managment: Goal: General experience of comfort will improve and/or be controlled Outcome: Progressing   Problem: Safety: Goal: Ability to remain free from injury will improve Outcome: Progressing   Problem: Skin Integrity: Goal: Risk for impaired skin integrity will decrease Outcome: Progressing
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
## 2024-05-01 NOTE — Progress Notes (Signed)
 Pt came in with $1,744 in his wallet, amount was counted with Cena, Charity fundraiser. Security was notified if they can pick it up for safe keeping. [place] somebody will come up here shortly. Endorsed to 1st shift RN.
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
## 2024-05-01 NOTE — Progress Notes (Signed)
 Patients wallet and cash was given to security. Cash was counted and verified $[phone] and wallet was place in secure envelope and sealed, yellow form on chart to pick up at discharge.  Saxton Chain, Cena Helling, RN
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
## 2024-05-02 ENCOUNTER — Other Ambulatory Visit (HOSPITAL_COMMUNITY): Payer: Self-pay

## 2024-05-02 ENCOUNTER — Encounter (HOSPITAL_COMMUNITY): Payer: Self-pay | Admitting: Surgery

## 2024-05-02 LAB — CBC
HCT: 36.3 % — ABNORMAL LOW (ref 39.0–52.0)
Hemoglobin: 11.8 g/dL — ABNORMAL LOW (ref 13.0–17.0)
MCH: 25.7 pg — ABNORMAL LOW (ref 26.0–34.0)
MCHC: 32.5 g/dL (ref 30.0–36.0)
MCV: 78.9 fL — ABNORMAL LOW (ref 80.0–100.0)
Platelets: 111 K/uL — ABNORMAL LOW (ref 150–400)
RBC: 4.6 MIL/uL (ref 4.22–5.81)
RDW: 18.8 % — ABNORMAL HIGH (ref 11.5–15.5)
WBC: 6.8 K/uL (ref 4.0–10.5)
nRBC: 0 % (ref 0.0–0.2)

## 2024-05-02 LAB — COMPREHENSIVE METABOLIC PANEL WITH GFR
ALT: 32 U/L (ref 0–44)
AST: 47 U/L — ABNORMAL HIGH (ref 15–41)
Albumin: 2.6 g/dL — ABNORMAL LOW (ref 3.5–5.0)
Alkaline Phosphatase: 57 U/L (ref 38–126)
Anion gap: 10 (ref 5–15)
BUN: 8 mg/dL (ref 8–23)
CO2: 27 mmol/L (ref 22–32)
Calcium: 9.2 mg/dL (ref 8.9–10.3)
Chloride: 100 mmol/L (ref 98–111)
Creatinine, Ser: 0.83 mg/dL (ref 0.61–1.24)
GFR, Estimated: >60 mL/min (ref >60)
Glucose, Bld: 177 mg/dL — ABNORMAL HIGH (ref 70–99)
Potassium: 4.3 mmol/L (ref 3.5–5.1)
Sodium: 137 mmol/L (ref 135–145)
Total Bilirubin: 0.9 mg/dL (ref 0.0–1.2)
Total Protein: 6.6 g/dL (ref 6.5–8.1)

## 2024-05-02 LAB — RAPID URINE DRUG SCREEN, HOSP PERFORMED
Amphetamines: NOT DETECTED
Barbiturates: NOT DETECTED
Benzodiazepines: POSITIVE — AB
Cocaine: NOT DETECTED
Opiates: POSITIVE — AB
Tetrahydrocannabinol: NOT DETECTED

## 2024-05-02 LAB — GLUCOSE, CAPILLARY
Glucose-Capillary: 152 mg/dL — ABNORMAL HIGH (ref 70–99)
Glucose-Capillary: 191 mg/dL — ABNORMAL HIGH (ref 70–99)
Glucose-Capillary: 158 mg/dL — ABNORMAL HIGH (ref 70–99)
Glucose-Capillary: 135 mg/dL — ABNORMAL HIGH (ref 70–99)

## 2024-05-02 LAB — HEMOGLOBIN A1C
Hgb A1c MFr Bld: 4.9 % (ref 4.8–5.6)
Mean Plasma Glucose: 93.93 mg/dL

## 2024-05-02 LAB — SARS CORONAVIRUS 2 BY RT PCR: SARS Coronavirus 2 by RT PCR: NEGATIVE

## 2024-05-02 MED ORDER — SODIUM CHLORIDE 0.9% FLUSH
10.0000 mL | Freq: Two times a day (BID) | INTRAVENOUS | Status: DC
  Administered 2024-05-02 – 2024-05-03 (×2): 10 mL

## 2024-05-02 MED ORDER — LIDOCAINE HCL 1 % IJ SOLN
20.0000 mL | Freq: Once | INTRAMUSCULAR | Status: AC
  Administered 2024-05-02: 20 mL via INTRADERMAL
  Filled 2024-05-02: qty 20

## 2024-05-02 MED ORDER — VITAMIN B-1 100 MG PO TABS: 100.0000 mg | ORAL_TABLET | Freq: Every day | ORAL | Status: DC

## 2024-05-02 MED ORDER — METHOCARBAMOL 500 MG PO TABS: 500.0000 mg | ORAL_TABLET | Freq: Three times a day (TID) | ORAL | Status: DC | PRN

## 2024-05-02 MED ORDER — MIRTAZAPINE 15 MG PO TABS
15.0000 mg | ORAL_TABLET | Freq: Every day | ORAL | Status: DC
  Administered 2024-05-02: 15 mg via ORAL
  Filled 2024-05-02: qty 1

## 2024-05-02 MED ORDER — ADULT MULTIVITAMIN W/MINERALS CH: 1.0000 | ORAL_TABLET | Freq: Every day | ORAL | Status: DC

## 2024-05-02 MED ORDER — SODIUM CHLORIDE 0.9% FLUSH: 10.0000 mL | INTRAVENOUS | Status: DC | PRN

## 2024-05-02 MED ORDER — DOCUSATE SODIUM 100 MG PO CAPS: 100.0000 mg | ORAL_CAPSULE | Freq: Two times a day (BID) | ORAL | Status: DC | PRN

## 2024-05-02 MED ORDER — POLYETHYLENE GLYCOL 3350 17 G PO PACK: 17.0000 g | PACK | Freq: Every day | ORAL | Status: DC | PRN

## 2024-05-02 MED ORDER — OXYCODONE HCL 5 MG PO TABS: 5.0000 mg | ORAL_TABLET | ORAL | Status: DC | PRN

## 2024-05-02 MED ORDER — NYSTATIN 100000 UNIT/GM EX CREA
TOPICAL_CREAM | Freq: Two times a day (BID) | CUTANEOUS | 0 refills | Status: DC | PRN
  Filled 2024-05-02: qty 30, fill #0

## 2024-05-02 MED ORDER — NYSTATIN 100000 UNIT/GM EX CREA
TOPICAL_CREAM | Freq: Two times a day (BID) | CUTANEOUS | Status: DC
  Filled 2024-05-02: qty 30

## 2024-05-02 MED ORDER — ENOXAPARIN SODIUM 40 MG/0.4ML IJ SOSY
40.0000 mg | PREFILLED_SYRINGE | Freq: Two times a day (BID) | INTRAMUSCULAR | Status: DC
  Administered 2024-05-02 – 2024-05-03 (×2): 40 mg via SUBCUTANEOUS
  Filled 2024-05-02 (×2): qty 0.4
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
## 2024-05-02 NOTE — Procedures (Signed)
 verbal consent was obtained before procedure started.  PROCEDURE: The area was prepped and draped in the usual sterile fashion. Local anesthesia was achieved using 10cc of  Lidocaine  1% without epinephrine. The wound was irrigated. 4, 2.0 Monocryl interrupted sutures were placed.  Estimated blood loss was minimal. A dry dressing was applied to the area.   The patient tolerated the procedure well without complications.   While it is safe to remove the sutures in 14 days, they do not have to be removed because they are absorbable.  Ok to shower with soap and water, pat incision dry.    Almarie Pringle, PA-C [facility] Please see Amion for pager number during day hours 7:00am-4:30pm

## 2024-05-02 NOTE — ED Notes (Signed)
 IVC paperwork complete and at nurses desk on 6N, expires 05/09/24, case # [id number]

## 2024-05-02 NOTE — Progress Notes (Signed)
 OT Cancellation Note  Patient Details Name: Elric Tirado MRN: [id number] DOB: 01/15/1956   Cancelled Treatment:    Reason Eval/Treat Not Completed: (P) OT screened, no needs identified, will sign off. Pt declined needs for OT or DME, has been ambulating to bathroom as needed independently, c/o pain to abdomen, RN notified for meds.  Elouise JONELLE Bott 05/02/2024, 4:01 PM
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
## 2024-05-02 NOTE — Plan of Care (Signed)
  Problem: Tissue Perfusion: Goal: Adequacy of tissue perfusion will improve Outcome: Progressing   Problem: Skin Integrity: Goal: Risk for impaired skin integrity will decrease Outcome: Progressing   Problem: Coping: Goal: Ability to adjust to condition or change in health will improve Outcome: Progressing   Problem: Safety: Goal: Ability to remain free from injury will improve Outcome: Progressing

## 2024-05-02 NOTE — Anesthesia Postprocedure Evaluation (Signed)
 Anesthesia Post Note  Patient: [name]  Procedure(s) Performed: Abdominal wall wound exploration, removal of foreign body, adhesiolysis, fascial closure (Abdomen)     Patient location during evaluation: PACU Anesthesia Type: General Level of consciousness: awake and alert Pain management: pain level controlled Vital Signs Assessment: post-procedure vital signs reviewed and stable Respiratory status: spontaneous breathing, nonlabored ventilation, respiratory function stable and patient connected to nasal cannula oxygen Cardiovascular status: blood pressure returned to baseline and stable Postop Assessment: no apparent nausea or vomiting Anesthetic complications: no   No notable events documented.  Last Vitals:  Vitals:   05/02/24 0508 05/02/24 0742  BP: (!) 157/63 138/68  Pulse: 92 82  Resp: 19 14  Temp: 36.6 C 36.5 C  SpO2: 100% 100%    Last Pain:  Vitals:   05/02/24 0742  TempSrc: Oral  PainSc:                  Giovonni Poirier S
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
## 2024-05-02 NOTE — Progress Notes (Signed)
 SARS Coronavirus 2 by RT PCR SARS Coronavirus 2 by RT PCR (hospital order, performed in [facility] hospital lab) *cepheid single result test* Anterior Nasal Swab Collected: 05/02/24 1121  Result status: Final  Resulting lab: [facility]  Reference range: NEGATIVE  Value: NEGATIVE  Comment: Performed at [facility], [address], [place], [place] [address]  *Additional information available - comment

## 2024-05-02 NOTE — Plan of Care (Signed)
  Problem: Health Behavior/Discharge Planning: Goal: Ability to manage health-related needs will improve Outcome: Progressing   Problem: Clinical Measurements: Goal: Will remain free from infection Outcome: Progressing Goal: Diagnostic test results will improve Outcome: Progressing   Problem: Nutrition: Goal: Adequate nutrition will be maintained Outcome: Progressing   Problem: Coping: Goal: Level of anxiety will decrease Outcome: Progressing

## 2024-05-02 NOTE — Discharge Instructions (Signed)
 [facility]      [facility], [place] [phone]  OPEN ABDOMINAL SURGERY: POST OP INSTRUCTIONS  Always review your discharge instruction sheet given to you by the facility where your surgery was performed.  IF YOU HAVE DISABILITY OR FAMILY LEAVE FORMS, YOU MUST BRING THEM TO THE OFFICE FOR PROCESSING.  PLEASE DO NOT GIVE THEM TO YOUR DOCTOR.  A prescription for pain medication may be given to you upon discharge.  Take your pain medication as prescribed, if needed.  If narcotic pain medicine is not needed, then you may take acetaminophen (Tylenol) or ibuprofen (Advil) as needed. Take your usually prescribed medications unless otherwise directed. If you need a refill on your pain medication, please contact your pharmacy. They will contact our office to request authorization.  Prescriptions will not be filled after 5pm or on week-ends. You should follow a light diet the first few days after arrival home, such as soup and crackers, pudding, etc.unless your doctor has advised otherwise. A high-fiber, low fat diet can be resumed as tolerated.   Be sure to include lots of fluids daily. Most patients will experience some swelling and bruising on the chest and neck area.  Ice packs will help.  Swelling and bruising can take several days to resolve Most patients will experience some swelling and bruising in the area of the incision. Ice pack will help. Swelling and bruising can take several days to resolve..  It is common to experience some constipation if taking pain medication after surgery.  Increasing fluid intake and taking a stool softener will usually help or prevent this problem from occurring.  A mild laxative (Milk of Magnesia or Miralax) should be taken according to package directions if there are no bowel movements after 48 hours.  You may have steri-strips (small skin tapes) in place directly over the incision.  These strips should be left on the skin for 7-10 days.  If your surgeon used skin  glue on the incision, you may shower in 24 hours.  The glue will flake off over the next 2-3 weeks.  Any sutures or staples will be removed at the office during your follow-up visit. You may find that a light gauze bandage over your incision may keep your staples from being rubbed or pulled. You may shower and replace the bandage daily. ACTIVITIES:  You may resume regular (light) daily activities beginning the next day--such as daily self-care, walking, climbing stairs--gradually increasing activities as tolerated.  You may have sexual intercourse when it is comfortable.  Refrain from any heavy lifting or straining until approved by your doctor. You may drive when you no longer are taking prescription pain medication, you can comfortably wear a seatbelt, and you can safely maneuver your car and apply brakes Return to Work: ___________________________________ Bonita Quin should see your doctor in the office for a follow-up appointment approximately two weeks after your surgery.  Make sure that you call for this appointment within a day or two after you arrive home to insure a convenient appointment time. OTHER INSTRUCTIONS:  _____________________________________________________________ _____________________________________________________________  WHEN TO CALL YOUR DOCTOR: Fever over 101.0 Inability to urinate Nausea and/or vomiting Extreme swelling or bruising Continued bleeding from incision. Increased pain, redness, or drainage from the incision. Difficulty swallowing or breathing Muscle cramping or spasms. Numbness or tingling in hands or feet or around lips.  The clinic staff is available to answer your questions during regular business hours.  Please don't hesitate to call and ask to speak to one of  the nurses if you have concerns.  For further questions, please visit [email or web address]
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
## 2024-05-02 NOTE — Progress Notes (Signed)
 IVC paperwork placed in pt drawer at room.

## 2024-05-03 ENCOUNTER — Other Ambulatory Visit: Payer: Self-pay

## 2024-05-03 ENCOUNTER — Encounter (HOSPITAL_COMMUNITY): Payer: Self-pay | Admitting: Surgery

## 2024-05-03 ENCOUNTER — Encounter: Payer: Self-pay | Admitting: Family

## 2024-05-03 ENCOUNTER — Inpatient Hospital Stay
Admission: AD | Admit: 2024-05-03 | Discharge: 2024-05-31 | DRG: 885 | Disposition: A | Discharge status: Home / Self Care | Payer: Self-pay | Source: Intra-hospital | Attending: Psychiatry | Admitting: Psychiatry

## 2024-05-03 DIAGNOSIS — S31109A Unspecified open wound of abdominal wall, unspecified quadrant without penetration into peritoneal cavity, initial encounter: Secondary | ICD-10-CM

## 2024-05-03 DIAGNOSIS — F332 Major depressive disorder, recurrent severe without psychotic features: Principal | ICD-10-CM | POA: Diagnosis present

## 2024-05-03 DIAGNOSIS — D696 Thrombocytopenia, unspecified: Secondary | ICD-10-CM

## 2024-05-03 DIAGNOSIS — L03311 Cellulitis of abdominal wall: Secondary | ICD-10-CM

## 2024-05-03 DIAGNOSIS — Z5902 Unsheltered homelessness: Secondary | ICD-10-CM | POA: Diagnosis not present

## 2024-05-03 DIAGNOSIS — F333 Major depressive disorder, recurrent, severe with psychotic symptoms: Secondary | ICD-10-CM | POA: Diagnosis not present

## 2024-05-03 DIAGNOSIS — E785 Hyperlipidemia, unspecified: Secondary | ICD-10-CM | POA: Diagnosis present

## 2024-05-03 DIAGNOSIS — Z9151 Personal history of suicidal behavior: Secondary | ICD-10-CM | POA: Diagnosis not present

## 2024-05-03 DIAGNOSIS — B182 Chronic viral hepatitis C: Secondary | ICD-10-CM | POA: Diagnosis present

## 2024-05-03 DIAGNOSIS — D6959 Other secondary thrombocytopenia: Secondary | ICD-10-CM | POA: Diagnosis present

## 2024-05-03 DIAGNOSIS — T8141XA Infection following a procedure, superficial incisional surgical site, initial encounter: Secondary | ICD-10-CM | POA: Diagnosis present

## 2024-05-03 DIAGNOSIS — I4891 Unspecified atrial fibrillation: Secondary | ICD-10-CM | POA: Diagnosis present

## 2024-05-03 DIAGNOSIS — G4700 Insomnia, unspecified: Secondary | ICD-10-CM | POA: Diagnosis present

## 2024-05-03 DIAGNOSIS — Z5941 Food insecurity: Secondary | ICD-10-CM | POA: Diagnosis not present

## 2024-05-03 DIAGNOSIS — Y907 Blood alcohol level of 200-239 mg/100 ml: Secondary | ICD-10-CM | POA: Diagnosis present

## 2024-05-03 DIAGNOSIS — Z79899 Other long term (current) drug therapy: Secondary | ICD-10-CM

## 2024-05-03 DIAGNOSIS — F1010 Alcohol abuse, uncomplicated: Secondary | ICD-10-CM | POA: Diagnosis present

## 2024-05-03 DIAGNOSIS — T8130XA Disruption of wound, unspecified, initial encounter: Secondary | ICD-10-CM | POA: Diagnosis present

## 2024-05-03 DIAGNOSIS — Z5982 Transportation insecurity: Secondary | ICD-10-CM

## 2024-05-03 DIAGNOSIS — K7030 Alcoholic cirrhosis of liver without ascites: Secondary | ICD-10-CM | POA: Diagnosis present

## 2024-05-03 DIAGNOSIS — Z634 Disappearance and death of family member: Secondary | ICD-10-CM

## 2024-05-03 DIAGNOSIS — F1510 Other stimulant abuse, uncomplicated: Secondary | ICD-10-CM | POA: Diagnosis present

## 2024-05-03 DIAGNOSIS — R45851 Suicidal ideations: Secondary | ICD-10-CM | POA: Diagnosis present

## 2024-05-03 DIAGNOSIS — F1110 Opioid abuse, uncomplicated: Secondary | ICD-10-CM | POA: Diagnosis present

## 2024-05-03 DIAGNOSIS — G8929 Other chronic pain: Secondary | ICD-10-CM | POA: Diagnosis present

## 2024-05-03 DIAGNOSIS — D509 Iron deficiency anemia, unspecified: Secondary | ICD-10-CM | POA: Diagnosis present

## 2024-05-03 DIAGNOSIS — Z993 Dependence on wheelchair: Secondary | ICD-10-CM | POA: Diagnosis not present

## 2024-05-03 DIAGNOSIS — I10 Essential (primary) hypertension: Secondary | ICD-10-CM | POA: Diagnosis present

## 2024-05-03 DIAGNOSIS — F329 Major depressive disorder, single episode, unspecified: Secondary | ICD-10-CM | POA: Diagnosis not present

## 2024-05-03 DIAGNOSIS — B9562 Methicillin resistant Staphylococcus aureus infection as the cause of diseases classified elsewhere: Secondary | ICD-10-CM | POA: Diagnosis present

## 2024-05-03 LAB — GLUCOSE, CAPILLARY: Glucose-Capillary: 230 mg/dL — ABNORMAL HIGH (ref 70–99)

## 2024-05-03 MED ORDER — POLYETHYLENE GLYCOL 3350 17 G PO PACK: 17.0000 g | PACK | Freq: Every day | ORAL | Status: DC | PRN

## 2024-05-03 MED ORDER — ACETAMINOPHEN 325 MG PO TABS
650.0000 mg | ORAL_TABLET | Freq: Four times a day (QID) | ORAL | Status: DC | PRN
  Administered 2024-05-07 – 2024-05-26 (×7): 650 mg via ORAL
  Filled 2024-05-03 (×6): qty 2

## 2024-05-03 MED ORDER — LORAZEPAM 1 MG PO TABS: 1.0000 mg | ORAL_TABLET | ORAL | Status: AC | PRN

## 2024-05-03 MED ORDER — NYSTATIN 100000 UNIT/GM EX CREA
TOPICAL_CREAM | Freq: Two times a day (BID) | CUTANEOUS | Status: DC
  Administered 2024-05-04 – 2024-05-30 (×15): 1 via TOPICAL
  Filled 2024-05-03 (×5): qty 30

## 2024-05-03 MED ORDER — OLANZAPINE 10 MG IM SOLR: 5.0000 mg | Freq: Three times a day (TID) | INTRAMUSCULAR | Status: DC | PRN

## 2024-05-03 MED ORDER — ALUM & MAG HYDROXIDE-SIMETH 200-200-20 MG/5ML PO SUSP
30.0000 mL | ORAL | Status: DC | PRN
  Administered 2024-05-23: 30 mL via ORAL
  Filled 2024-05-03: qty 30

## 2024-05-03 MED ORDER — MAGNESIUM HYDROXIDE 400 MG/5ML PO SUSP
30.0000 mL | Freq: Every day | ORAL | Status: DC | PRN
  Administered 2024-05-06: 30 mL via ORAL
  Filled 2024-05-03: qty 30

## 2024-05-03 MED ORDER — METHOCARBAMOL 500 MG PO TABS
500.0000 mg | ORAL_TABLET | Freq: Three times a day (TID) | ORAL | Status: AC
  Administered 2024-05-03: 500 mg via ORAL
  Filled 2024-05-03: qty 1

## 2024-05-03 MED ORDER — METHOCARBAMOL 1000 MG/10ML IJ SOLN
500.0000 mg | Freq: Three times a day (TID) | INTRAMUSCULAR | Status: AC
  Filled 2024-05-03 (×2): qty 5

## 2024-05-03 MED ORDER — FOLIC ACID 1 MG PO TABS
1.0000 mg | ORAL_TABLET | Freq: Every day | ORAL | Status: DC
  Administered 2024-05-05 – 2024-05-09 (×5): 1 mg via ORAL
  Filled 2024-05-03 (×6): qty 1

## 2024-05-03 MED ORDER — LORAZEPAM 2 MG/ML IJ SOLN: 1.0000 mg | INTRAMUSCULAR | Status: AC | PRN

## 2024-05-03 MED ORDER — OLANZAPINE 5 MG PO TBDP: 5.0000 mg | ORAL_TABLET | Freq: Three times a day (TID) | ORAL | Status: DC | PRN

## 2024-05-03 MED ORDER — OXYCODONE HCL 5 MG PO TABS
5.0000 mg | ORAL_TABLET | ORAL | Status: DC | PRN
  Administered 2024-05-04 – 2024-05-06 (×6): 5 mg via ORAL
  Administered 2024-05-06: 10 mg via ORAL
  Administered 2024-05-06 – 2024-05-10 (×11): 5 mg via ORAL
  Filled 2024-05-03 (×15): qty 1
  Filled 2024-05-03 (×2): qty 2
  Filled 2024-05-03 (×2): qty 1

## 2024-05-03 MED ORDER — DOCUSATE SODIUM 100 MG PO CAPS
100.0000 mg | ORAL_CAPSULE | Freq: Two times a day (BID) | ORAL | Status: DC
  Administered 2024-05-03 – 2024-05-31 (×61): 100 mg via ORAL
  Filled 2024-05-03 (×57): qty 1

## 2024-05-03 MED ORDER — AMLODIPINE BESYLATE 5 MG PO TABS
10.0000 mg | ORAL_TABLET | Freq: Every day | ORAL | Status: DC
  Administered 2024-05-05 – 2024-05-31 (×29): 10 mg via ORAL
  Filled 2024-05-03 (×28): qty 2

## 2024-05-03 MED ORDER — MELATONIN 5 MG PO TABS
5.0000 mg | ORAL_TABLET | Freq: Every evening | ORAL | Status: DC | PRN
  Administered 2024-05-04 – 2024-05-30 (×11): 5 mg via ORAL
  Filled 2024-05-03 (×12): qty 1

## 2024-05-03 MED ORDER — MIRTAZAPINE 15 MG PO TABS
15.0000 mg | ORAL_TABLET | Freq: Every day | ORAL | Status: DC
  Administered 2024-05-03 – 2024-05-30 (×31): 15 mg via ORAL
  Filled 2024-05-03 (×29): qty 1

## 2024-05-03 MED ORDER — ENOXAPARIN SODIUM 60 MG/0.6ML IJ SOSY: 50.0000 mg | PREFILLED_SYRINGE | INTRAMUSCULAR | Status: DC

## 2024-05-03 MED ORDER — ADULT MULTIVITAMIN W/MINERALS CH
1.0000 | ORAL_TABLET | Freq: Every day | ORAL | Status: DC
  Administered 2024-05-05 – 2024-05-31 (×30): 1 via ORAL
  Filled 2024-05-03 (×28): qty 1

## 2024-05-03 NOTE — Progress Notes (Signed)
 Brief psych note 7/18  Pt was accepted to [facility] med psych on 7/17 and pt signed voluntary consent but then at time of transfer yesterday evening was refusing to go. Pt was IVC for harm to self given suicide attempt and plan to leave hospital and over dose on fentanyl . Pt scheduled to leave for [facility] med psych unit 7/18 via sheriff.   Plan: Continue mirtazapine  15 mg once daily Recommend getting patient ACTT given numerous sucidie attempts and to get patient payee for social security check to help him secure housing
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
## 2024-05-03 NOTE — Progress Notes (Signed)
 Removed internal jugular central line without complication. Patient is staying on the bed until 12:30. Patient understood it and informed patient's sitter as well. Informed patient's RN this matter. HS [name]
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
## 2024-05-03 NOTE — Group Note (Signed)
 Recreation Therapy Group Note   Group Topic:Self-Esteem  Group Date: 05/03/2024 Start Time: 1405 End Time: 1500 Facilitators: Celestia Jeoffrey BRAVO, LRT, CTRS Location: Dayroom  Group Description: Positive Affirmation Bingo. LRT and patients played multiple games of Bingo with music playing in the background. LRT and pts discussed what a positive affirmation is, the importance of speaking kindly to yourself, and the use of this as a coping skill.   Goal Area(s) Addressed: Patient will learn positive affirmations.  Patient will engage in recreation activity.  Patient will increase communication.    Affect/Mood: N/A   Participation Level: Did not attend    Clinical Observations/Individualized Feedback: Patient was not present on the unit at the time of group.   Plan: Continue to engage patient in RT group sessions 2-3x/week.   Jeoffrey BRAVO Celestia, LRT, CTRS 05/03/2024 4:58 PM

## 2024-05-03 NOTE — Plan of Care (Signed)
  Problem: Activity: Goal: Risk for activity intolerance will decrease Outcome: Progressing   Problem: Coping: Goal: Level of anxiety will decrease Outcome: Progressing   Problem: Safety: Goal: Ability to remain free from injury will improve Outcome: Progressing   Problem: Coping: Goal: Ability to adjust to condition or change in health will improve Outcome: Progressing

## 2024-05-03 NOTE — Group Note (Signed)
 Date:  05/03/2024 Time:  8:57 PM  Group Topic/Focus:  Self Care:   The focus of this group is to help patients understand the importance of self-care in order to improve or restore emotional, physical, spiritual, interpersonal, and financial health.  MHT reviewed rules and expectations of the unit. MHT informed patients of 15 minute rounding. MHT informed doors are usually left slightly cracked to avoid hearing the clicking of the doors opening every 15 minutes. MHT informed patients not to be alarmed if they see someone looking in at night, it was only to check for their safety.  MHT group topic was wellness.  MHT explained while on the unit, patients have access to counselors and doctors. MHT encouraged continued treatment upon discharge. MHT informed of outpatient services that were available at [facility]. MHT informed patients of open access and how to get linked with outpatient services. MHT encouraged patients to continue taking medications as prescribed. MHT explained how some people will start doing well on medications and feel they no longer need, then will decompensate when stopping the medication.  MHT provided group with resources available in the community. MHT informed of the peer living room that is available to the community at [facility]. MHT informed they have access to the internet, daily groups, and a peer to assist with needs while there. MHT provided the address and telephone number. [address] [place] [phone]  Participation Level:  Did Not Attend  Participation Quality:  Did not attend  Affect:  Did not attend  Cognitive:  Did not attend  Insight: None  Engagement in Group:  Did not attend  Modes of Intervention:  Did not attend  Additional Comments:  Did not attend  [name] 05/03/2024, 8:57 PM

## 2024-05-03 NOTE — TOC Progression Note (Signed)
 Transition of Care [facility]) - Progression Note    Patient Details  Name: [name] MRN: [id number] Date of Birth: [birth year]  [facility] CM/SW Contact  Aristide Waggle E Amirra Herling, LCSW Phone Number: 05/03/2024, 8:56 AM  Clinical Narrative:    [facility] Dept called for transport to [facility].  Officer states they will call 6N with an ETA.        Expected Discharge Plan and Services         Expected Discharge Date: 05/02/24                                     Social Determinants of Health (SDOH) Interventions SDOH Screenings   Tobacco Use: Low Risk  (05/03/2024)    Readmission Risk Interventions     No data to display

## 2024-05-03 NOTE — Plan of Care (Signed)

## 2024-05-03 NOTE — Plan of Care (Signed)
 ?  Problem: Education: ?Goal: Utilization of techniques to improve thought processes will improve ?Outcome: Not Progressing ?Goal: Knowledge of the prescribed therapeutic regimen will improve ?Outcome: Not Progressing ?  ?Problem: Activity: ?Goal: Interest or engagement in leisure activities will improve ?Outcome: Not Progressing ?Goal: Imbalance in normal sleep/wake cycle will improve ?Outcome: Not Progressing ?  ?Problem: Coping: ?Goal: Coping ability will improve ?Outcome: Not Progressing ?Goal: Will verbalize feelings ?Outcome: Not Progressing ?  ?Problem: Health Behavior/Discharge Planning: ?Goal: Ability to make decisions will improve ?Outcome: Not Progressing ?Goal: Compliance with therapeutic regimen will improve ?Outcome: Not Progressing ?  ?Problem: Role Relationship: ?Goal: Will demonstrate positive changes in social behaviors and relationships ?Outcome: Not Progressing ?  ?Problem: Safety: ?Goal: Ability to disclose and discuss suicidal ideas will improve ?Outcome: Not Progressing ?Goal: Ability to identify and utilize support systems that promote safety will improve ?Outcome: Not Progressing ?  ?Problem: Self-Concept: ?Goal: Will verbalize positive feelings about self ?Outcome: Not Progressing ?Goal: Level of anxiety will decrease ?Outcome: Not Progressing ?  ?

## 2024-05-03 NOTE — Progress Notes (Signed)
   05/03/24 1128  Patient and Hospital Property Returned  Patient is satisfied that all belongings have been returned? Yes  Name of person receiving valuables? [name]  Specify valuables returned money [phone] (patient counted) and brown wallet with several credit/debit cards  Home Medications/TOC prescriptions returned from Pharmacy  Name of person receiving meds and/or prescriptions? [name]  Dermatherapy linen/gowns NOT sent with patient or transporter Not applicable   [name] belongings returned to him as listed above. Valuables and medications placed in blue backpack that is secured on [address]. Sheriffs office to return to transport patient to [facility] as soon as possible.  Patient still needs Right internal jugular central line removed, IV team aware.

## 2024-05-03 NOTE — Plan of Care (Signed)
   Problem: Education: Goal: Utilization of techniques to improve thought processes will improve Outcome: Progressing Goal: Knowledge of the prescribed therapeutic regimen will improve Outcome: Progressing   Problem: Activity: Goal: Interest or engagement in leisure activities will improve Outcome: Progressing Goal: Imbalance in normal sleep/wake cycle will improve Outcome: Progressing   Problem: Coping: Goal: Coping ability will improve Outcome: Progressing Goal: Will verbalize feelings Outcome: Progressing   Problem: Health Behavior/Discharge Planning: Goal: Ability to make decisions will improve Outcome: Progressing Goal: Compliance with therapeutic regimen will improve Outcome: Progressing   Problem: Role Relationship: Goal: Will demonstrate positive changes in social behaviors and relationships Outcome: Progressing   Problem: Safety: Goal: Ability to disclose and discuss suicidal ideas will improve Outcome: Progressing Goal: Ability to identify and utilize support systems that promote safety will improve Outcome: Progressing   Problem: Self-Concept: Goal: Will verbalize positive feelings about self Outcome: Progressing Goal: Level of anxiety will decrease Outcome: Progressing

## 2024-05-03 NOTE — Progress Notes (Signed)
   05/03/24 2100  Psych Admission Type (Psych Patients Only)  Admission Status Involuntary  Psychosocial Assessment  Patient Complaints Depression  Eye Contact Brief  Facial Expression Animated  Affect Depressed  Speech Logical/coherent  Interaction Assertive  Motor Activity Unsteady  Appearance/Hygiene Unremarkable  Behavior Characteristics Irritable  Mood Irritable  Thought Process  Coherency WDL  Content WDL  Delusions None reported or observed  Perception WDL  Hallucination None reported or observed  Judgment WDL  Confusion None  Danger to Self  Current suicidal ideation? Active  Self-Injurious Behavior No self-injurious ideation or behavior indicators observed or expressed   Agreement Not to Harm Self Yes  Description of Agreement verbal  Danger to Others  Danger to Others None reported or observed

## 2024-05-03 NOTE — Tx Team (Signed)
 Initial Treatment Plan 05/03/2024 4:54 PM [name] FMW:[id number]    PATIENT STRESSORS: Other: housing      PATIENT STRENGTHS: Ability for insight  Active sense of humor  Capable of independent living  Communication skills    PATIENT IDENTIFIED PROBLEMS: homeless  Lonely                    DISCHARGE CRITERIA:  Ability to meet basic life and health needs Adequate post-discharge living arrangements Improved stabilization in mood, thinking, and/or behavior  PRELIMINARY DISCHARGE PLAN: Placement in alternative living arrangements  PATIENT/FAMILY INVOLVEMENT: This treatment plan has been presented to and reviewed with the patient, [name]. The patient has been given the opportunity to ask questions and make suggestions.  [name], RN 05/03/2024, 4:54 PM

## 2024-05-04 DIAGNOSIS — F332 Major depressive disorder, recurrent severe without psychotic features: Secondary | ICD-10-CM | POA: Diagnosis not present

## 2024-05-04 MED ORDER — TAMSULOSIN HCL 0.4 MG PO CAPS
0.4000 mg | ORAL_CAPSULE | Freq: Every day | ORAL | Status: DC
  Administered 2024-05-04 – 2024-05-31 (×31): 0.4 mg via ORAL
  Filled 2024-05-04 (×28): qty 1

## 2024-05-04 MED ORDER — BACLOFEN 10 MG PO TABS
5.0000 mg | ORAL_TABLET | Freq: Three times a day (TID) | ORAL | Status: DC
  Filled 2024-05-04: qty 0.5

## 2024-05-04 MED ORDER — GABAPENTIN 300 MG PO CAPS: 300.0000 mg | ORAL_CAPSULE | Freq: Three times a day (TID) | ORAL | Status: DC

## 2024-05-04 MED ORDER — LOSARTAN POTASSIUM 25 MG PO TABS: 12.5000 mg | ORAL_TABLET | Freq: Every morning | ORAL | Status: DC

## 2024-05-04 MED ORDER — QUETIAPINE FUMARATE 200 MG PO TABS: 200.0000 mg | ORAL_TABLET | Freq: Every day | ORAL | Status: DC

## 2024-05-04 MED ORDER — OLANZAPINE 5 MG PO TBDP
10.0000 mg | ORAL_TABLET | Freq: Every day | ORAL | Status: DC
  Administered 2024-05-04 – 2024-05-06 (×3): 10 mg via ORAL
  Filled 2024-05-04 (×3): qty 2

## 2024-05-04 MED ORDER — BACLOFEN 10 MG PO TABS
5.0000 mg | ORAL_TABLET | Freq: Three times a day (TID) | ORAL | Status: DC
  Administered 2024-05-04 – 2024-05-26 (×68): 5 mg via ORAL
  Filled 2024-05-04 (×68): qty 0.5

## 2024-05-04 MED ORDER — PANTOPRAZOLE SODIUM 40 MG PO TBEC
40.0000 mg | DELAYED_RELEASE_TABLET | Freq: Every day | ORAL | Status: DC
Start: 2024-05-04 — End: 2024-05-31
  Administered 2024-05-04 – 2024-05-31 (×31): 40 mg via ORAL
  Filled 2024-05-04 (×28): qty 1

## 2024-05-04 MED ORDER — GABAPENTIN 300 MG PO CAPS
300.0000 mg | ORAL_CAPSULE | Freq: Three times a day (TID) | ORAL | Status: DC
Start: 2024-05-04 — End: 2024-05-26
  Administered 2024-05-04 – 2024-05-26 (×68): 300 mg via ORAL
  Filled 2024-05-04 (×68): qty 1

## 2024-05-04 MED ORDER — ROSUVASTATIN CALCIUM 10 MG PO TABS
20.0000 mg | ORAL_TABLET | Freq: Every day | ORAL | Status: DC
Start: 2024-05-04 — End: 2024-05-31
  Administered 2024-05-04 – 2024-05-31 (×31): 20 mg via ORAL
  Filled 2024-05-04 (×28): qty 2

## 2024-05-04 NOTE — Progress Notes (Signed)
 Patient is irritable.  Refused to answer assessment questions.  Refused all scheduled medications.  Patient did not eat breakfast or lunch.   Patient was compliant with home medications ordered this afternoon.  Present in the dayroom for dinner and completed his menu.

## 2024-05-04 NOTE — Group Note (Signed)
 Date:  05/04/2024 Time:  9:40 PM  Group Topic/Focus:  Developing a Wellness Toolbox:   The focus of this group is to help patients develop a wellness toolbox with skills and strategies to promote recovery upon discharge. Identifying Needs:   The focus of this group is to help patients identify their personal needs that have been historically problematic and identify healthy behaviors to address their needs.  MHT started group with an ice breaker, telling 2 true statements and 1 false, and the group trying to figure out which was false. Group enjoyed ice breaker, with 100% participation. MHT opened group with any concerns they wanted addressed. Group chose to talk about what to do after discharge. MHT recapped some things from the previous night, about linkage to mental health services and continued treatment with physicians and therapist.  MHT addressed concerns about finding resources available in the community to address needs. MHT informed of 988 and 211 phone services. MHT informed how to find available resources, such as housing, food, utility assistance, etc by calling 211. MHT explained what to expect when calling. MHT called to model how to find resources available. MHT asked what resources they wanted to look for. Group chose food stamps/food. MHT called 211, put on speaker for the group to hear the call, and requested information on how to get food stamps and available food in the community. MHT answered the questions, and  showed group how to follow the prompts. MHT was given these resources: Food and nutrition services [phone] [facility] [phone] Friday 10-11am [address] [place], [place] [address] [employer] [phone] Wednesday 9-10am [address], [place] [address] In addition, MHT informed by calling 211, they can link to other services, such as AA meetings available in the community. MHT informed of additional ways  to find resources in the community by going on-line to [employer]. MHT informed they could complete the request on-line by providing name, telephone number, and email address. MHT informed they had categories they could choose from and make a note of what they were specifically looking for assistance with.  MHT processed with group about dealing with stressors, by focusing on the solution as opposed to the problem. MHT provided supportive counseling to address changing the way they think, to change the way they react to situations. MHT informed they could not change the things that happen, but they can change the way they look at the situation, to change the way they respond to it. MHT gave an example of a half full/empty cup. MHT explained looking at it half full had a different feeling than looking at it half empty. MHT explained how focusing on it in a negative way determined how they felt about it. Group was receptive and stated they would start focusing on solutions instead of focusing in on the problem. MHT addressed specific concerns of what to do when feeling like relapsing. MHT encouraged group to find out the triggers, warning signs, and developing coping skills. MHT explained making a list of the people that help keep them calm and help them work through difficult situations would be helpful to have, so they can easily access them in need. MHT suggested finding a sponsor as well, to help work through the difficult times. MHT encouraged group to do what was needed and not what was convenient or easy. MHT encouraged group to consult with their care teams to develop a crisis plan to follow upon discharge, so  they know what they need to do to remain safe and healthy upon discharge.  Participation Level:  Minimal  Participation Quality:  Appropriate  Affect:  Appropriate  Cognitive:  Appropriate  Insight: Appropriate  Engagement in Group:  Engaged  Modes of Intervention:   Discussion  Additional Comments:  Left group half way through to lay down. Participated while in attendance  [name] 05/04/2024, 9:40 PM
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
## 2024-05-04 NOTE — Group Note (Signed)
 Date:  05/04/2024 Time:  11:14 AM  Group Topic/Focus:  Outside Rec/Music Therapy  The purpose of this group is to allow patients to go out and enjoy the outdoors while participating in outside activities interacting with peers and listening to their favorite tunes.    Participation Level:  Did Not Attend  [name] 05/04/2024, 11:14 AM

## 2024-05-04 NOTE — Plan of Care (Signed)
  Problem: Education: Goal: Utilization of techniques to improve thought processes will improve Outcome: Not Progressing Goal: Knowledge of the prescribed therapeutic regimen will improve Outcome: Not Progressing   Problem: Coping: Goal: Coping ability will improve Outcome: Not Progressing   Problem: Health Behavior/Discharge Planning: Goal: Ability to make decisions will improve Outcome: Not Progressing
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
## 2024-05-05 DIAGNOSIS — F332 Major depressive disorder, recurrent severe without psychotic features: Secondary | ICD-10-CM | POA: Diagnosis not present

## 2024-05-05 MED ORDER — POLYVINYL ALCOHOL 1.4 % OP SOLN
1.0000 [drp] | OPHTHALMIC | Status: DC | PRN
  Administered 2024-05-06 – 2024-05-28 (×14): 1 [drp] via OPHTHALMIC
  Filled 2024-05-05: qty 15

## 2024-05-05 NOTE — Progress Notes (Signed)
   05/05/24 2200  Psych Admission Type (Psych Patients Only)  Admission Status Involuntary  Psychosocial Assessment  Patient Complaints Depression  Eye Contact Brief  Facial Expression Flat  Affect Flat  Speech Logical/coherent  Interaction Assertive  Motor Activity Slow  Appearance/Hygiene In scrubs  Behavior Characteristics Cooperative  Mood Labile  Thought Process  Coherency WDL  Content WDL  Delusions None reported or observed  Perception WDL  Hallucination None reported or observed  Judgment Impaired  Confusion None  Danger to Self  Current suicidal ideation? Denies  Self-Injurious Behavior No self-injurious ideation or behavior indicators observed or expressed   Agreement Not to Harm Self Yes  Description of Agreement verbal  Danger to Others  Danger to Others None reported or observed

## 2024-05-05 NOTE — Plan of Care (Signed)
   Problem: Education: Goal: Utilization of techniques to improve thought processes will improve Outcome: Progressing Goal: Knowledge of the prescribed therapeutic regimen will improve Outcome: Progressing   Problem: Activity: Goal: Interest or engagement in leisure activities will improve Outcome: Progressing Goal: Imbalance in normal sleep/wake cycle will improve Outcome: Progressing   Problem: Coping: Goal: Coping ability will improve Outcome: Progressing Goal: Will verbalize feelings Outcome: Progressing   Problem: Health Behavior/Discharge Planning: Goal: Ability to make decisions will improve Outcome: Progressing Goal: Compliance with therapeutic regimen will improve Outcome: Progressing   Problem: Role Relationship: Goal: Will demonstrate positive changes in social behaviors and relationships Outcome: Progressing   Problem: Safety: Goal: Ability to disclose and discuss suicidal ideas will improve Outcome: Progressing Goal: Ability to identify and utilize support systems that promote safety will improve Outcome: Progressing   Problem: Self-Concept: Goal: Will verbalize positive feelings about self Outcome: Progressing Goal: Level of anxiety will decrease Outcome: Progressing

## 2024-05-05 NOTE — Plan of Care (Signed)
  Problem: Education: Goal: Knowledge of the prescribed therapeutic regimen will improve Outcome: Progressing   Problem: Coping: Goal: Coping ability will improve Outcome: Progressing   Problem: Activity: Goal: Interest or engagement in leisure activities will improve Outcome: Not Progressing   Problem: Health Behavior/Discharge Planning: Goal: Compliance with therapeutic regimen will improve Outcome: Not Progressing

## 2024-05-05 NOTE — Progress Notes (Signed)
 Patient is cooperative.  Flat affect.  Endorses chronic depression.  Pain rated 8/10 in back. Denies SI/HI.    Compliant with scheduled medications. PRN medication given for pain x 2.  15 min checks in place for safety.  Patient was visible in the milieu.  Appropriate interaction with peers and staff.

## 2024-05-05 NOTE — Group Note (Signed)
 Date:  05/05/2024 Time:  11:26 AM  Group Topic/Focus:  Outside Rec/Music Therapy The purpose of this group is for patients to get out and get fresh air while doing outside activities and socializing with other peers.     Participation Level:  Did Not Attend  [name] 05/05/2024, 11:26 AM
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
## 2024-05-05 NOTE — Group Note (Signed)
 Date:  05/05/2024 Time:  9:25 PM  Group Topic/Focus:  Wrap-Up Group:   The focus of this group is to help patients review their daily goal of treatment and discuss progress on daily workbooks.    Participation Level:  Active  Participation Quality:  Appropriate  Affect:  Appropriate  Cognitive:  Alert  Insight: Appropriate  Engagement in Group:  Engaged  Modes of Intervention:  Discussion  Additional Comments:    [name] Bunker 05/05/2024, 9:25 PM

## 2024-05-05 NOTE — Plan of Care (Signed)
  Problem: Education: Goal: Utilization of techniques to improve thought processes will improve Outcome: Progressing Goal: Knowledge of the prescribed therapeutic regimen will improve Outcome: Progressing   Problem: Activity: Goal: Interest or engagement in leisure activities will improve Outcome: Progressing Goal: Imbalance in normal sleep/wake cycle will improve Outcome: Progressing   Problem: Coping: Goal: Coping ability will improve Outcome: Progressing Goal: Will verbalize feelings Outcome: Progressing   Problem: Health Behavior/Discharge Planning: Goal: Ability to make decisions will improve Outcome: Progressing Goal: Compliance with therapeutic regimen will improve Outcome: Progressing   

## 2024-05-06 DIAGNOSIS — F332 Major depressive disorder, recurrent severe without psychotic features: Secondary | ICD-10-CM
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
## 2024-05-06 NOTE — Plan of Care (Signed)
   Problem: Activity: Goal: Interest or engagement in leisure activities will improve Outcome: Progressing Goal: Imbalance in normal sleep/wake cycle will improve Outcome: Progressing

## 2024-05-06 NOTE — [facility] Suicide Risk Assessment (Signed)
 [facility] INPATIENT:  Family/Significant Other Suicide Prevention Education  Suicide Prevention Education:  Patient Refusal for Family/Significant Other Suicide Prevention Education: The patient [name] has refused to provide written consent for family/significant other to be provided Family/Significant Other Suicide Prevention Education during admission and/or prior to discharge.  Physician notified.  Lum JONETTA Croft 05/06/2024, 3:23 PM

## 2024-05-06 NOTE — Group Note (Signed)
 Date:  05/06/2024 Time:  9:11 PM  Group Topic/Focus:  Developing a Wellness Toolbox:   The focus of this group is to help patients develop a wellness toolbox with skills and strategies to promote recovery upon discharge. Dimensions of Wellness:   The focus of this group is to introduce the topic of wellness and discuss the role each dimension of wellness plays in total health. Wellness Toolbox:   The focus of this group is to discuss various aspects of wellness, balancing those aspects and exploring ways to increase the ability to experience wellness.  Patients will create a wellness toolbox for use upon discharge.    Participation Level:  Active  Participation Quality:  Supportive and sharing  Affect:  Appropriate  Cognitive:  Appropriate  Insight: Appropriate and Good  Engagement in Group:  Engaged and Supportive  Modes of Intervention:  Socialization and Support  Additional Comments:  Pt did attend group. Pt was engaged but slightly withdrawn from the conversation about wellness. Did share a personal testimony of his own to help motivate others in the group about wellness. Will continue to motivate pt to keep attending group sessions with peers.  [name] Ryder 05/06/2024, 9:11 PM

## 2024-05-06 NOTE — Group Note (Signed)
 Date:  05/06/2024 Time:  11:37 AM  Group Topic/Focus:  Making Healthy Choices:   The focus of this group is to help patients identify negative/unhealthy choices they were using prior to admission and identify positive/healthier coping strategies to replace them upon discharge.  Did Not Attend   Arland Nutting 05/06/2024, 11:37 AM

## 2024-05-06 NOTE — Progress Notes (Signed)
   05/06/24 1300  Psych Admission Type (Psych Patients Only)  Admission Status Involuntary  Psychosocial Assessment  Patient Complaints Anxiety;Depression  Eye Contact Brief  Facial Expression Flat  Affect Flat  Speech Logical/coherent  Interaction Assertive  Motor Activity Slow  Appearance/Hygiene In scrubs  Behavior Characteristics Cooperative  Mood Pleasant  Thought Process  Coherency WDL  Content WDL  Delusions None reported or observed  Perception WDL  Hallucination None reported or observed  Judgment Impaired  Confusion None  Danger to Self  Current suicidal ideation? Denies  Agreement Not to Harm Self Yes  Description of Agreement verbal  Danger to Others  Danger to Others None reported or observed
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
## 2024-05-06 NOTE — BH IP Treatment Plan (Signed)
 Interdisciplinary Treatment and Diagnostic Plan Update  05/06/2024 Time of Session: 3:03 PM  [name] MRN: [id number]  Principal Diagnosis: MDD (major depressive disorder), recurrent episode, severe (HCC)  Secondary Diagnoses: Principal Problem:   MDD (major depressive disorder), recurrent episode, severe (HCC)   Current Medications:  Current Facility-Administered Medications  Medication Dose Route Frequency Provider Last Rate Last Admin   acetaminophen  (TYLENOL ) tablet 650 mg  650 mg Oral Q6H PRN Starkes-Perry, Takia S, FNP       alum & mag hydroxide-simeth (MAALOX/MYLANTA) 200-200-20 MG/5ML suspension 30 mL  30 mL Oral Q4H PRN Starkes-Perry, Takia S, FNP       amLODipine  (NORVASC ) tablet 10 mg  10 mg Oral Daily Wilkie Majel RAMAN, FNP   10 mg at 05/06/24 0945   artificial tears ophthalmic solution 1 drop  1 drop Both Eyes PRN Jadapalle, Sree, MD   1 drop at 05/06/24 9046   baclofen  (LIORESAL ) tablet 5 mg  5 mg Oral TID Jadapalle, Sree, MD   5 mg at 05/06/24 0944   docusate sodium  (COLACE) capsule 100 mg  100 mg Oral BID Starkes-Perry, Takia S, FNP   100 mg at 05/06/24 0945   folic acid  (FOLVITE ) tablet 1 mg  1 mg Oral Daily Starkes-Perry, Takia S, FNP   1 mg at 05/06/24 0945   gabapentin  (NEURONTIN ) capsule 300 mg  300 mg Oral TID Jadapalle, Sree, MD   300 mg at 05/06/24 0945   magnesium  hydroxide (MILK OF MAGNESIA) suspension 30 mL  30 mL Oral Daily PRN Wilkie Majel RAMAN, FNP   30 mL at 05/06/24 1123   melatonin tablet 5 mg  5 mg Oral QHS PRN Wilkie Majel RAMAN, FNP   5 mg at 05/05/24 2153   mirtazapine  (REMERON ) tablet 15 mg  15 mg Oral QHS Wilkie Majel RAMAN, FNP   15 mg at 05/05/24 2146   multivitamin with minerals tablet 1 tablet  1 tablet Oral Daily Starkes-Perry, Takia S, FNP   1 tablet at 05/06/24 0945   nystatin  cream (MYCOSTATIN )   Topical BID Starkes-Perry, Takia S, FNP   1 Application at 05/04/24 2204   OLANZapine  (ZYPREXA ) injection 5 mg  5 mg  Intramuscular TID PRN Wilkie Majel RAMAN, FNP       OLANZapine  zydis (ZYPREXA ) disintegrating tablet 10 mg  10 mg Oral QHS Jadapalle, Sree, MD   10 mg at 05/05/24 2146   OLANZapine  zydis (ZYPREXA ) disintegrating tablet 5 mg  5 mg Oral TID PRN Starkes-Perry, Takia S, FNP       oxyCODONE  (Oxy IR/ROXICODONE ) immediate release tablet 5-10 mg  5-10 mg Oral Q4H PRN Wilkie Majel RAMAN, FNP   5 mg at 05/06/24 1401   pantoprazole  (PROTONIX ) EC tablet 40 mg  40 mg Oral Daily Jadapalle, Sree, MD   40 mg at 05/06/24 0945   polyethylene glycol (MIRALAX  / GLYCOLAX ) packet 17 g  17 g Oral Daily PRN Wilkie Majel RAMAN, FNP       rosuvastatin  (CRESTOR ) tablet 20 mg  20 mg Oral Daily Jadapalle, Sree, MD   20 mg at 05/06/24 0945   tamsulosin  (FLOMAX ) capsule 0.4 mg  0.4 mg Oral Daily Jadapalle, Sree, MD   0.4 mg at 05/06/24 0945   PTA Medications: Medications Prior to Admission  Medication Sig Dispense Refill Last Dose/Taking   amLODipine  (NORVASC ) 10 MG tablet Take 10 mg by mouth daily.      [Paused] Baclofen  5 MG TABS Take 1 tablet by mouth in the morning, at noon,  and at bedtime.      [Paused] diltiazem (CARDIZEM) 60 MG tablet Take 60 mg by mouth 3 (three) times daily. (Patient not taking: Reported on 05/01/2024)      docusate sodium  (COLACE) 100 MG capsule Take 1 capsule (100 mg total) by mouth 2 (two) times daily as needed for mild constipation.      folic acid  (FOLVITE ) 1 MG tablet Take 1 mg by mouth daily. (Patient not taking: Reported on 05/01/2024)      [Paused] furosemide (LASIX) 80 MG tablet Take 80 mg by mouth daily. (Patient not taking: Reported on 05/01/2024)      [Paused] gabapentin  (NEURONTIN ) 300 MG capsule Take 300 mg by mouth 3 (three) times daily.      [Paused] hydrochlorothiazide (HYDRODIURIL) 12.5 MG tablet Take 12.5 mg by mouth daily as needed (swelling). (Patient not taking: Reported on 05/01/2024)      [Paused] hydrOXYzine (VISTARIL) 25 MG capsule Take 25 mg by mouth 2 (two) times  daily. (Patient not taking: Reported on 05/01/2024)      [Paused] lactulose  (CHRONULAC ) 10 GM/15ML solution Take 20 g by mouth 2 (two) times daily. (Patient not taking: Reported on 05/01/2024)      [Paused] losartan  (COZAAR ) 25 MG tablet Take 12.5 mg by mouth every morning. (Patient not taking: Reported on 05/01/2024)      methocarbamol  (ROBAXIN ) 500 MG tablet Take 1 tablet (500 mg total) by mouth every 8 (eight) hours as needed for muscle spasms.      [Paused] mirtazapine  (REMERON ) 15 MG tablet Take 15 mg by mouth at bedtime. (Patient not taking: Reported on 05/01/2024)      Multiple Vitamin (MULTIVITAMIN WITH MINERALS) TABS tablet Take 1 tablet by mouth daily.      nystatin  cream (MYCOSTATIN ) Apply topically 2 (two) times daily as needed for dry skin. 30 g 0    oxyCODONE  (OXY IR/ROXICODONE ) 5 MG immediate release tablet Take 1-2 tablets (5-10 mg total) by mouth every 4 (four) hours as needed (5mg  for moderate pain, 10mg  for severe pain).      [Paused] pantoprazole  (PROTONIX ) 40 MG tablet Take 40 mg by mouth daily. (Patient not taking: Reported on 05/01/2024)      polyethylene glycol (MIRALAX  / GLYCOLAX ) 17 g packet Take 17 g by mouth daily as needed (constipation).      [Paused] QUEtiapine  (SEROQUEL ) 200 MG tablet Take 200 mg by mouth at bedtime. (Patient not taking: Reported on 05/01/2024)      [Paused] rosuvastatin  (CRESTOR ) 20 MG tablet Take 20 mg by mouth daily. (Patient not taking: Reported on 05/01/2024)      [Paused] spironolactone (ALDACTONE) 100 MG tablet Take 100 mg by mouth daily. (Patient not taking: Reported on 05/01/2024)      [Paused] tamsulosin  (FLOMAX ) 0.4 MG CAPS capsule Take 0.4 mg by mouth daily. (Patient not taking: Reported on 05/01/2024)      thiamine  (VITAMIN B-1) 100 MG tablet Take 1 tablet (100 mg total) by mouth daily.      [Paused] traZODone (DESYREL) 50 MG tablet Take 50 mg by mouth at bedtime. (Patient not taking: Reported on 05/01/2024)       Patient Stressors: Other: housing      Patient Strengths: Ability for insight  Active sense of humor  Capable of independent living  Communication skills   Treatment Modalities: Medication Management, Group therapy, Case management,  1 to 1 session with clinician, Psychoeducation, Recreational therapy.   Physician Treatment Plan for Primary Diagnosis: MDD (major depressive disorder), recurrent episode,  severe (HCC) Long Term Goal(s): Improvement in symptoms so as ready for discharge   Short Term Goals: Ability to identify changes in lifestyle to reduce recurrence of condition will improve Ability to verbalize feelings will improve Ability to disclose and discuss suicidal ideas Ability to demonstrate self-control will improve Ability to identify and develop effective coping behaviors will improve  Medication Management: Evaluate patient's response, side effects, and tolerance of medication regimen.  Therapeutic Interventions: 1 to 1 sessions, Unit Group sessions and Medication administration.  Evaluation of Outcomes: Progressing  Physician Treatment Plan for Secondary Diagnosis: Principal Problem:   MDD (major depressive disorder), recurrent episode, severe (HCC)  Long Term Goal(s): Improvement in symptoms so as ready for discharge   Short Term Goals: Ability to identify changes in lifestyle to reduce recurrence of condition will improve Ability to verbalize feelings will improve Ability to disclose and discuss suicidal ideas Ability to demonstrate self-control will improve Ability to identify and develop effective coping behaviors will improve     Medication Management: Evaluate patient's response, side effects, and tolerance of medication regimen.  Therapeutic Interventions: 1 to 1 sessions, Unit Group sessions and Medication administration.  Evaluation of Outcomes: Progressing   RN Treatment Plan for Primary Diagnosis: MDD (major depressive disorder), recurrent episode, severe (HCC) Long Term Goal(s):  Knowledge of disease and therapeutic regimen to maintain health will improve  Short Term Goals: Ability to remain free from injury will improve, Ability to verbalize frustration and anger appropriately will improve, Ability to demonstrate self-control, Ability to participate in decision making will improve, Ability to verbalize feelings will improve, Ability to disclose and discuss suicidal ideas, Ability to identify and develop effective coping behaviors will improve, and Compliance with prescribed medications will improve  Medication Management: RN will administer medications as ordered by provider, will assess and evaluate patient's response and provide education to patient for prescribed medication. RN will report any adverse and/or side effects to prescribing provider.  Therapeutic Interventions: 1 on 1 counseling sessions, Psychoeducation, Medication administration, Evaluate responses to treatment, Monitor vital signs and CBGs as ordered, Perform/monitor CIWA, COWS, AIMS and Fall Risk screenings as ordered, Perform wound care treatments as ordered.  Evaluation of Outcomes: Progressing   LCSW Treatment Plan for Primary Diagnosis: MDD (major depressive disorder), recurrent episode, severe (HCC) Long Term Goal(s): Safe transition to appropriate next level of care at discharge, Engage patient in therapeutic group addressing interpersonal concerns.  Short Term Goals: Engage patient in aftercare planning with referrals and resources, Increase social support, Increase ability to appropriately verbalize feelings, Increase emotional regulation, Facilitate acceptance of mental health diagnosis and concerns, Facilitate patient progression through stages of change regarding substance use diagnoses and concerns, Identify triggers associated with mental health/substance abuse issues, and Increase skills for wellness and recovery  Therapeutic Interventions: Assess for all discharge needs, 1 to 1 time with  Social worker, Explore available resources and support systems, Assess for adequacy in community support network, Educate family and significant other(s) on suicide prevention, Complete Psychosocial Assessment, Interpersonal group therapy.  Evaluation of Outcomes: Progressing   Progress in Treatment: Attending groups: No. Participating in groups: No. Taking medication as prescribed: Yes. Toleration medication: Yes. Family/Significant other contact made: No, will contact:  CSW will contact if given permission  Patient understands diagnosis: Yes. Discussing patient identified problems/goals with staff: Yes. Medical problems stabilized or resolved: Yes. Denies suicidal/homicidal ideation: Yes. Issues/concerns per patient self-inventory: No. Other: None   New problem(s) identified: No, Describe:  None identified   New Short Term/Long  Term Goal(s): elimination of symptoms of psychosis, medication management for mood stabilization; elimination of SI thoughts; development of comprehensive mental wellness plan.   Patient Goals:  I'd like to go to one of these long-term things  Discharge Plan or Barriers: CSW will assist with appropriate discharge planning   Reason for Continuation of Hospitalization: Depression Medication stabilization  Estimated Length of Stay: 1 to 7 days  Last 3 [place] Suicide Severity Risk Score: Flowsheet Row Admission (Current) from 05/03/2024 in [facility]  C-SSRS RISK CATEGORY High Risk    Last PHQ 2/9 Scores:     No data to display          Scribe for Treatment Team: Lum JONETTA Croft, [place] 05/06/2024 3:25 PM

## 2024-05-06 NOTE — Progress Notes (Signed)
   05/06/24 2200  Psych Admission Type (Psych Patients Only)  Admission Status Involuntary  Psychosocial Assessment  Patient Complaints Anxiety;Depression  Eye Contact Brief  Facial Expression Flat  Affect Flat  Speech Logical/coherent  Interaction Assertive  Motor Activity Slow  Appearance/Hygiene In scrubs  Behavior Characteristics Cooperative  Mood Pleasant  Thought Process  Coherency WDL  Content WDL  Delusions None reported or observed  Perception WDL  Hallucination None reported or observed  Judgment Impaired  Confusion None  Danger to Self  Current suicidal ideation? Denies  Agreement Not to Harm Self Yes  Description of Agreement verbal  Danger to Others  Danger to Others None reported or observed

## 2024-05-06 NOTE — Group Note (Signed)
 Recreation Therapy Group Note   Group Topic:Goal Setting  Group Date: 05/06/2024 Start Time: 1500 End Time: 1600 Facilitators: Celestia Jeoffrey BRAVO, LRT, CTRS Location: Dayroom  Group Description: Product/process development scientist. Patients were given many different magazines, a glue stick, markers, and a piece of cardstock paper. LRT and pts discussed the importance of having goals in life. LRT and pts discussed the difference between short-term and long-term goals, as well as what a SMART goal is. LRT encouraged pts to create a vision board, with images they picked and then cut out with safety scissors from the magazine, for themselves, that capture their short and long-term goals. LRT encouraged pts to show and explain their vision board to the group.   Goal Area(s) Addressed:  Patient will gain knowledge of short vs. long term goals.  Patient will identify goals for themselves. Patient will practice setting SMART goals.   Affect/Mood: N/A   Participation Level: Did not attend    Clinical Observations/Individualized Feedback: Patient did not attend group.   Plan: Continue to engage patient in RT group sessions 2-3x/week.   Jeoffrey BRAVO Celestia, LRT, CTRS 05/06/2024 5:52 PM

## 2024-05-06 NOTE — Group Note (Signed)
 Date:  05/06/2024 Time:  6:36 PM  Group Topic/Focus:  Healthy Communication:   The focus of this group is to discuss communication, barriers to communication, as well as healthy ways to communicate with others.    Participation Level:  Did Not Attend  [name] 05/06/2024, 6:36 PM

## 2024-05-06 NOTE — BH IP Treatment Plan (Signed)
 Interdisciplinary Treatment and Diagnostic Plan Update  05/06/2024 Time of Session:  [name] MRN: [id number]  Principal Diagnosis: MDD (major depressive disorder), recurrent episode, severe (HCC)  Secondary Diagnoses: Principal Problem:   MDD (major depressive disorder), recurrent episode, severe (HCC)   Current Medications:  Current Facility-Administered Medications  Medication Dose Route Frequency Provider Last Rate Last Admin   acetaminophen  (TYLENOL ) tablet 650 mg  650 mg Oral Q6H PRN Starkes-Perry, Takia S, FNP       alum & mag hydroxide-simeth (MAALOX/MYLANTA) 200-200-20 MG/5ML suspension 30 mL  30 mL Oral Q4H PRN Starkes-Perry, Takia S, FNP       amLODipine  (NORVASC ) tablet 10 mg  10 mg Oral Daily Wilkie Majel RAMAN, FNP   10 mg at 05/06/24 0945   artificial tears ophthalmic solution 1 drop  1 drop Both Eyes PRN Jadapalle, Sree, MD   1 drop at 05/06/24 9046   baclofen  (LIORESAL ) tablet 5 mg  5 mg Oral TID Jadapalle, Sree, MD   5 mg at 05/06/24 0944   docusate sodium  (COLACE) capsule 100 mg  100 mg Oral BID Starkes-Perry, Takia S, FNP   100 mg at 05/06/24 0945   folic acid  (FOLVITE ) tablet 1 mg  1 mg Oral Daily Starkes-Perry, Takia S, FNP   1 mg at 05/06/24 0945   gabapentin  (NEURONTIN ) capsule 300 mg  300 mg Oral TID Jadapalle, Sree, MD   300 mg at 05/06/24 0945   magnesium  hydroxide (MILK OF MAGNESIA) suspension 30 mL  30 mL Oral Daily PRN Starkes-Perry, Takia S, FNP       melatonin tablet 5 mg  5 mg Oral QHS PRN Wilkie Majel RAMAN, FNP   5 mg at 05/05/24 2153   mirtazapine  (REMERON ) tablet 15 mg  15 mg Oral QHS Wilkie Majel RAMAN, FNP   15 mg at 05/05/24 2146   multivitamin with minerals tablet 1 tablet  1 tablet Oral Daily Starkes-Perry, Takia S, FNP   1 tablet at 05/06/24 0945   nystatin  cream (MYCOSTATIN )   Topical BID Wilkie Majel RAMAN, FNP   1 Application at 05/04/24 2204   OLANZapine  (ZYPREXA ) injection 5 mg  5 mg Intramuscular TID PRN Wilkie Majel RAMAN, FNP       OLANZapine  zydis (ZYPREXA ) disintegrating tablet 10 mg  10 mg Oral QHS Jadapalle, Sree, MD   10 mg at 05/05/24 2146   OLANZapine  zydis (ZYPREXA ) disintegrating tablet 5 mg  5 mg Oral TID PRN Wilkie Majel RAMAN, FNP       oxyCODONE  (Oxy IR/ROXICODONE ) immediate release tablet 5-10 mg  5-10 mg Oral Q4H PRN Wilkie Majel RAMAN, FNP   5 mg at 05/06/24 9045   pantoprazole  (PROTONIX ) EC tablet 40 mg  40 mg Oral Daily Jadapalle, Sree, MD   40 mg at 05/06/24 0945   polyethylene glycol (MIRALAX  / GLYCOLAX ) packet 17 g  17 g Oral Daily PRN Wilkie Majel RAMAN, FNP       rosuvastatin  (CRESTOR ) tablet 20 mg  20 mg Oral Daily Jadapalle, Sree, MD   20 mg at 05/06/24 0945   tamsulosin  (FLOMAX ) capsule 0.4 mg  0.4 mg Oral Daily Jadapalle, Sree, MD   0.4 mg at 05/06/24 0945   PTA Medications: Medications Prior to Admission  Medication Sig Dispense Refill Last Dose/Taking   amLODipine  (NORVASC ) 10 MG tablet Take 10 mg by mouth daily.      [Paused] Baclofen  5 MG TABS Take 1 tablet by mouth in the morning, at noon, and at bedtime.      [  Paused] diltiazem (CARDIZEM) 60 MG tablet Take 60 mg by mouth 3 (three) times daily. (Patient not taking: Reported on 05/01/2024)      docusate sodium  (COLACE) 100 MG capsule Take 1 capsule (100 mg total) by mouth 2 (two) times daily as needed for mild constipation.      folic acid  (FOLVITE ) 1 MG tablet Take 1 mg by mouth daily. (Patient not taking: Reported on 05/01/2024)      [Paused] furosemide (LASIX) 80 MG tablet Take 80 mg by mouth daily. (Patient not taking: Reported on 05/01/2024)      [Paused] gabapentin  (NEURONTIN ) 300 MG capsule Take 300 mg by mouth 3 (three) times daily.      [Paused] hydrochlorothiazide (HYDRODIURIL) 12.5 MG tablet Take 12.5 mg by mouth daily as needed (swelling). (Patient not taking: Reported on 05/01/2024)      [Paused] hydrOXYzine (VISTARIL) 25 MG capsule Take 25 mg by mouth 2 (two) times daily. (Patient not taking: Reported  on 05/01/2024)      [Paused] lactulose  (CHRONULAC ) 10 GM/15ML solution Take 20 g by mouth 2 (two) times daily. (Patient not taking: Reported on 05/01/2024)      [Paused] losartan  (COZAAR ) 25 MG tablet Take 12.5 mg by mouth every morning. (Patient not taking: Reported on 05/01/2024)      methocarbamol  (ROBAXIN ) 500 MG tablet Take 1 tablet (500 mg total) by mouth every 8 (eight) hours as needed for muscle spasms.      [Paused] mirtazapine  (REMERON ) 15 MG tablet Take 15 mg by mouth at bedtime. (Patient not taking: Reported on 05/01/2024)      Multiple Vitamin (MULTIVITAMIN WITH MINERALS) TABS tablet Take 1 tablet by mouth daily.      nystatin  cream (MYCOSTATIN ) Apply topically 2 (two) times daily as needed for dry skin. 30 g 0    oxyCODONE  (OXY IR/ROXICODONE ) 5 MG immediate release tablet Take 1-2 tablets (5-10 mg total) by mouth every 4 (four) hours as needed (5mg  for moderate pain, 10mg  for severe pain).      [Paused] pantoprazole  (PROTONIX ) 40 MG tablet Take 40 mg by mouth daily. (Patient not taking: Reported on 05/01/2024)      polyethylene glycol (MIRALAX  / GLYCOLAX ) 17 g packet Take 17 g by mouth daily as needed (constipation).      [Paused] QUEtiapine  (SEROQUEL ) 200 MG tablet Take 200 mg by mouth at bedtime. (Patient not taking: Reported on 05/01/2024)      [Paused] rosuvastatin  (CRESTOR ) 20 MG tablet Take 20 mg by mouth daily. (Patient not taking: Reported on 05/01/2024)      [Paused] spironolactone (ALDACTONE) 100 MG tablet Take 100 mg by mouth daily. (Patient not taking: Reported on 05/01/2024)      [Paused] tamsulosin  (FLOMAX ) 0.4 MG CAPS capsule Take 0.4 mg by mouth daily. (Patient not taking: Reported on 05/01/2024)      thiamine  (VITAMIN B-1) 100 MG tablet Take 1 tablet (100 mg total) by mouth daily.      [Paused] traZODone (DESYREL) 50 MG tablet Take 50 mg by mouth at bedtime. (Patient not taking: Reported on 05/01/2024)       Patient Stressors: Other: housing     Patient Strengths: Ability for  insight  Active sense of humor  Capable of independent living  Communication skills   Treatment Modalities: Medication Management, Group therapy, Case management,  1 to 1 session with clinician, Psychoeducation, Recreational therapy.   Physician Treatment Plan for Primary Diagnosis: MDD (major depressive disorder), recurrent episode, severe (HCC) Long Term Goal(s): Improvement in symptoms  so as ready for discharge   Short Term Goals: Ability to identify changes in lifestyle to reduce recurrence of condition will improve Ability to verbalize feelings will improve Ability to disclose and discuss suicidal ideas Ability to demonstrate self-control will improve Ability to identify and develop effective coping behaviors will improve  Medication Management: Evaluate patient's response, side effects, and tolerance of medication regimen.  Therapeutic Interventions: 1 to 1 sessions, Unit Group sessions and Medication administration.  Evaluation of Outcomes: Progressing  Physician Treatment Plan for Secondary Diagnosis: Principal Problem:   MDD (major depressive disorder), recurrent episode, severe (HCC)  Long Term Goal(s): Improvement in symptoms so as ready for discharge   Short Term Goals: Ability to identify changes in lifestyle to reduce recurrence of condition will improve Ability to verbalize feelings will improve Ability to disclose and discuss suicidal ideas Ability to demonstrate self-control will improve Ability to identify and develop effective coping behaviors will improve     Medication Management: Evaluate patient's response, side effects, and tolerance of medication regimen.  Therapeutic Interventions: 1 to 1 sessions, Unit Group sessions and Medication administration.  Evaluation of Outcomes: Progressing   RN Treatment Plan for Primary Diagnosis: MDD (major depressive disorder), recurrent episode, severe (HCC) Long Term Goal(s): Knowledge of disease and therapeutic  regimen to maintain health will improve  Short Term Goals: Ability to remain free from injury will improve, Ability to verbalize frustration and anger appropriately will improve, Ability to demonstrate self-control, Ability to participate in decision making will improve, Ability to verbalize feelings will improve, Ability to disclose and discuss suicidal ideas, Ability to identify and develop effective coping behaviors will improve, and Compliance with prescribed medications will improve  Medication Management: RN will administer medications as ordered by provider, will assess and evaluate patient's response and provide education to patient for prescribed medication. RN will report any adverse and/or side effects to prescribing provider.  Therapeutic Interventions: 1 on 1 counseling sessions, Psychoeducation, Medication administration, Evaluate responses to treatment, Monitor vital signs and CBGs as ordered, Perform/monitor CIWA, COWS, AIMS and Fall Risk screenings as ordered, Perform wound care treatments as ordered.  Evaluation of Outcomes: Progressing   LCSW Treatment Plan for Primary Diagnosis: MDD (major depressive disorder), recurrent episode, severe (HCC) Long Term Goal(s): Safe transition to appropriate next level of care at discharge, Engage patient in therapeutic group addressing interpersonal concerns.  Short Term Goals: Engage patient in aftercare planning with referrals and resources, Increase social support, Increase ability to appropriately verbalize feelings, Increase emotional regulation, Facilitate acceptance of mental health diagnosis and concerns, Facilitate patient progression through stages of change regarding substance use diagnoses and concerns, Identify triggers associated with mental health/substance abuse issues, and Increase skills for wellness and recovery  Therapeutic Interventions: Assess for all discharge needs, 1 to 1 time with Social worker, Explore available  resources and support systems, Assess for adequacy in community support network, Educate family and significant other(s) on suicide prevention, Complete Psychosocial Assessment, Interpersonal group therapy.  Evaluation of Outcomes: Progressing   Progress in Treatment: Attending groups: Yes. and No. Participating in groups: Yes. and No. Taking medication as prescribed: Yes. Toleration medication: Yes. Family/Significant other contact made: No, will contact:  CSW will contact if given permission Patient understands diagnosis: Yes. Discussing patient identified problems/goals with staff: Yes. Medical problems stabilized or resolved: Yes. Denies suicidal/homicidal ideation: Yes. Issues/concerns per patient self-inventory: No. Other: None   New problem(s) identified: No, Describe:  None identified   New Short Term/Long Term Goal(s): elimination of symptoms  of psychosis, medication management for mood stabilization; elimination of SI thoughts; development of comprehensive mental wellness/sobriety plan.   Patient Goals:  I want to get better  Discharge Plan or Barriers: CSW will assist with appropriate discharge planning   Reason for Continuation of Hospitalization: Depression Medication stabilization Suicidal ideation  Estimated Length of Stay: 1 to 7 days   Last 3 [place] Suicide Severity Risk Score: Flowsheet Row Admission (Current) from 05/03/2024 in [facility]  C-SSRS RISK CATEGORY High Risk    Last PHQ 2/9 Scores:     No data to display          Scribe for Treatment Team: Lum JONETTA Croft, LCSWA 05/06/2024 11:02 AM

## 2024-05-07 DIAGNOSIS — F332 Major depressive disorder, recurrent severe without psychotic features: Secondary | ICD-10-CM | POA: Diagnosis not present

## 2024-05-07 LAB — HEMOGLOBIN A1C
Hgb A1c MFr Bld: 5.1 % (ref 4.8–5.6)
Mean Plasma Glucose: 99.67 mg/dL

## 2024-05-07 MED ORDER — SERTRALINE HCL 50 MG PO TABS
50.0000 mg | ORAL_TABLET | Freq: Every day | ORAL | Status: DC
  Administered 2024-05-07 – 2024-05-18 (×12): 50 mg via ORAL
  Filled 2024-05-07 (×12): qty 1

## 2024-05-07 MED ORDER — CHLORDIAZEPOXIDE HCL 25 MG PO CAPS
25.0000 mg | ORAL_CAPSULE | Freq: Four times a day (QID) | ORAL | Status: AC | PRN
  Administered 2024-05-07: 25 mg via ORAL
  Filled 2024-05-07: qty 1

## 2024-05-07 MED ORDER — LACTULOSE 10 GM/15ML PO SOLN
20.0000 g | Freq: Every day | ORAL | Status: DC | PRN
  Administered 2024-05-07: 20 g via ORAL
  Filled 2024-05-07: qty 30

## 2024-05-07 MED ORDER — QUETIAPINE FUMARATE 100 MG PO TABS
100.0000 mg | ORAL_TABLET | Freq: Every day | ORAL | Status: DC
  Administered 2024-05-07 – 2024-05-17 (×11): 100 mg via ORAL
  Filled 2024-05-07 (×11): qty 1

## 2024-05-07 NOTE — Group Note (Signed)
 Date:  05/07/2024 Time:  9:25 PM  Group Topic/Focus:  Wrap-Up Group:   The focus of this group is to help patients review their daily goal of treatment and discuss progress on daily workbooks.    Participation Level:  Did Not Attend  Participation Quality:     Affect:     Cognitive:     Insight: None  Engagement in Group:  None  Modes of Intervention:     Additional Comments:    Tommas CHRISTELLA Bunker 05/07/2024, 9:25 PM

## 2024-05-07 NOTE — Group Note (Signed)
 Recreation Therapy Group Note   Group Topic:General Recreation  Group Date: 05/07/2024 Start Time: 1100 End Time: 1135 Facilitators: Celestia Jeoffrey BRAVO, LRT, CTRS Location: Courtyard  Group Description: Outdoor Recreation. Patients had the option to play corn hole, ring toss, bowling or listening to music while outside in the courtyard getting fresh air and sunlight. Patients helped water and prune the raised garden beds. LRT and patients discussed things that they enjoy doing in their free time outside of the hospital. LRT encouraged patients to drink water after being active and getting their heart rate up.   Goal Area(s) Addressed: Patient will identify leisure interests.  Patient will practice healthy decision making. Patient will engage in recreation activity.   Affect/Mood: N/A   Participation Level: Did not attend    Clinical Observations/Individualized Feedback: Patient did not attend group.   Plan: Continue to engage patient in RT group sessions 2-3x/week.   Jeoffrey BRAVO Celestia, LRT, CTRS 05/07/2024 2:06 PM
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
## 2024-05-07 NOTE — Group Note (Signed)
 Date:  05/07/2024 Time:  1:39 PM  Group Topic/Focus:  Coping With Mental Health Crisis:   The purpose of this group is to help patients identify strategies for coping with mental health crisis.  Group discusses possible causes of crisis and ways to manage them effectively. Healthy Communication:   The focus of this group is to discuss communication, barriers to communication, as well as healthy ways to communicate with others. Overcoming Stress:   The focus of this group is to define stress and help patients assess their triggers.    Participation Level:  Active  Participation Quality:  Appropriate  Affect:  Appropriate  Cognitive:  Appropriate  Insight: Appropriate  Engagement in Group:  Engaged  Modes of Intervention:  Activity and Discussion  Additional Comments:    [name] [name] 05/07/2024, 1:39 PM

## 2024-05-07 NOTE — Plan of Care (Signed)
   Problem: Education: Goal: Utilization of techniques to improve thought processes will improve Outcome: Progressing Goal: Knowledge of the prescribed therapeutic regimen will improve Outcome: Progressing   Problem: Activity: Goal: Interest or engagement in leisure activities will improve Outcome: Progressing Goal: Imbalance in normal sleep/wake cycle will improve Outcome: Progressing   Problem: Coping: Goal: Coping ability will improve Outcome: Progressing Goal: Will verbalize feelings Outcome: Progressing   Problem: Health Behavior/Discharge Planning: Goal: Ability to make decisions will improve Outcome: Progressing Goal: Compliance with therapeutic regimen will improve Outcome: Progressing   Problem: Role Relationship: Goal: Will demonstrate positive changes in social behaviors and relationships Outcome: Progressing   Problem: Safety: Goal: Ability to disclose and discuss suicidal ideas will improve Outcome: Progressing Goal: Ability to identify and utilize support systems that promote safety will improve Outcome: Progressing   Problem: Self-Concept: Goal: Will verbalize positive feelings about self Outcome: Progressing Goal: Level of anxiety will decrease Outcome: Progressing

## 2024-05-07 NOTE — BHH Counselor (Signed)
 Referrals sent to [facility] and [facility] on patient's behalf.   CSW to continue to assess.     Alveta Kerns, MSW, LCSWA 05/07/2024 3:09 PM

## 2024-05-07 NOTE — Plan of Care (Signed)
  Problem: Coping: Goal: Coping ability will improve Outcome: Progressing   Problem: Safety: Goal: Ability to disclose and discuss suicidal ideas will improve Outcome: Progressing   Problem: Role Relationship: Goal: Will demonstrate positive changes in social behaviors and relationships Outcome: Progressing

## 2024-05-07 NOTE — Group Note (Signed)
 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 Patients and LRT discuss what it means to be "at peace", what it feels like physically and mentally. Pts are given a canvas and watercolor paint to use and encouraged to draw their idea of a peaceful place. Pts and LRT discuss how they use this in their daily life post discharge. Pts are encouraged to take their canvas home with them as a reminder to find their peaceful place whenever they are feeling depressed, anxious, etc.    Goal Area(s) Addressed:  Patient will identify what it means to experience a "peaceful" emotion. Patient will identify a new coping skill.  Patient will express their emotions through art. Patients will increase communication by talking with LRT and peers while in group.   Affect/Mood: N/A   Participation Level: Did not attend    Clinical Observations/Individualized Feedback: Patient did not attend group.   Plan: Continue to engage patient in RT group sessions 2-3x/week.   [address] LRT, CTRS 05/07/2024 5:20 PM

## 2024-05-07 NOTE — Group Note (Signed)
 LCSW Group Therapy Note  Group Date: 05/07/2024 Start Time: 1315 End Time: 1400   Type of Therapy and Topic:  Group Therapy - Healthy vs Unhealthy Coping Skills  Participation Level:  Minimal   Description of Group The focus of this group was to determine what unhealthy coping techniques typically are used by group members and what healthy coping techniques would be helpful in coping with various problems. Patients were guided in becoming aware of the differences between healthy and unhealthy coping techniques. Patients were asked to identify 2-3 healthy coping skills they would like to learn to use more effectively.  Therapeutic Goals Patients learned that coping is what human beings do all day long to deal with various situations in their lives Patients defined and discussed healthy vs unhealthy coping techniques Patients identified their preferred coping techniques and identified whether these were healthy or unhealthy Patients determined 2-3 healthy coping skills they would like to become more familiar with and use more often. Patients provided support and ideas to each other   Summary of Patient Progress:  Patient proved open to input from peers and feedback from CSW. Patient demonstrated fair insight into the subject matter, was respectful of peers, and participated throughout the entire session.   Therapeutic Modalities Cognitive Behavioral Therapy Motivational Interviewing  [name], [place] 05/07/2024  3:47 PM

## 2024-05-07 NOTE — Group Note (Signed)
 Date:  05/07/2024 Time:  9:41 PM  Group Topic/Focus:  Wrap-Up Group:   The focus of this group is to help patients review their daily goal of treatment and discuss progress on daily workbooks.    Participation Level:  Active  Participation Quality:  Appropriate  Affect:  Appropriate  Cognitive:  Alert  Insight: Appropriate  Engagement in Group:  Engaged  Modes of Intervention:  Discussion  Additional Comments:    [name] 05/07/2024, 9:41 PM

## 2024-05-07 NOTE — Progress Notes (Signed)
   05/07/24 1000  Psych Admission Type (Psych Patients Only)  Admission Status Involuntary  Psychosocial Assessment  Patient Complaints Anxiety  Eye Contact Avoids  Facial Expression Flat  Affect Flat  Speech Logical/coherent  Interaction Assertive  Motor Activity Slow  Appearance/Hygiene In scrubs  Behavior Characteristics Cooperative  Mood Pleasant  Thought Process  Coherency WDL  Content WDL  Delusions None reported or observed  Perception WDL  Hallucination None reported or observed  Judgment Impaired  Confusion None  Danger to Self  Current suicidal ideation? Denies  Agreement Not to Harm Self Yes  Description of Agreement verbal  Danger to Others  Danger to Others None reported or observed

## 2024-05-08 ENCOUNTER — Inpatient Hospital Stay

## 2024-05-08 DIAGNOSIS — F332 Major depressive disorder, recurrent severe without psychotic features: Secondary | ICD-10-CM | POA: Diagnosis not present

## 2024-05-08 DIAGNOSIS — L03311 Cellulitis of abdominal wall: Secondary | ICD-10-CM

## 2024-05-08 LAB — LIPID PANEL
Cholesterol: 92 mg/dL (ref 0–200)
HDL: 37 mg/dL — ABNORMAL LOW (ref >40)
LDL Cholesterol: 42 mg/dL (ref 0–99)
Total CHOL/HDL Ratio: 2.5 ratio
Triglycerides: 63 mg/dL (ref <150)
VLDL: 13 mg/dL (ref 0–40)

## 2024-05-08 LAB — COMPREHENSIVE METABOLIC PANEL WITH GFR
ALT: 33 U/L (ref 0–44)
AST: 45 U/L — ABNORMAL HIGH (ref 15–41)
Albumin: 2.8 g/dL — ABNORMAL LOW (ref 3.5–5.0)
Alkaline Phosphatase: 62 U/L (ref 38–126)
Anion gap: 10 (ref 5–15)
BUN: 11 mg/dL (ref 8–23)
CO2: 19 mmol/L — ABNORMAL LOW (ref 22–32)
Calcium: 8.2 mg/dL — ABNORMAL LOW (ref 8.9–10.3)
Chloride: 106 mmol/L (ref 98–111)
Creatinine, Ser: 0.7 mg/dL (ref 0.61–1.24)
GFR, Estimated: >60 mL/min (ref >60)
Glucose, Bld: 159 mg/dL — ABNORMAL HIGH (ref 70–99)
Potassium: 3.5 mmol/L (ref 3.5–5.1)
Sodium: 135 mmol/L (ref 135–145)
Total Bilirubin: 0.9 mg/dL (ref 0.0–1.2)
Total Protein: 7.2 g/dL (ref 6.5–8.1)

## 2024-05-08 LAB — CBC
HCT: 33 % — ABNORMAL LOW (ref 39.0–52.0)
Hemoglobin: 10.7 g/dL — ABNORMAL LOW (ref 13.0–17.0)
MCH: 25.6 pg — ABNORMAL LOW (ref 26.0–34.0)
MCHC: 32.4 g/dL (ref 30.0–36.0)
MCV: 78.9 fL — ABNORMAL LOW (ref 80.0–100.0)
Platelets: 110 K/uL — ABNORMAL LOW (ref 150–400)
RBC: 4.18 MIL/uL — ABNORMAL LOW (ref 4.22–5.81)
RDW: 18.4 % — ABNORMAL HIGH (ref 11.5–15.5)
WBC: 8 K/uL (ref 4.0–10.5)
nRBC: 0 % (ref 0.0–0.2)

## 2024-05-08 LAB — LACTIC ACID, PLASMA
Lactic Acid, Venous: 1 mmol/L (ref 0.5–1.9)
Lactic Acid, Venous: 1.1 mmol/L (ref 0.5–1.9)

## 2024-05-08 LAB — MRSA NEXT GEN BY PCR, NASAL: MRSA by PCR Next Gen: DETECTED — AB

## 2024-05-08 MED ORDER — DOXYCYCLINE HYCLATE 100 MG PO TABS
100.0000 mg | ORAL_TABLET | Freq: Two times a day (BID) | ORAL | Status: AC
  Administered 2024-05-08 – 2024-05-15 (×14): 100 mg via ORAL
  Filled 2024-05-08 (×14): qty 1

## 2024-05-08 NOTE — Progress Notes (Signed)
   05/08/24 2100  Psych Admission Type (Psych Patients Only)  Admission Status Involuntary  Psychosocial Assessment  Patient Complaints Anxiety  Eye Contact Brief  Facial Expression Flat  Affect Flat  Speech Logical/coherent  Interaction Assertive  Motor Activity Slow  Appearance/Hygiene In scrubs  Behavior Characteristics Cooperative;Appropriate to situation  Mood Pleasant  Thought Process  Coherency WDL  Content WDL  Delusions None reported or observed  Perception WDL  Hallucination None reported or observed  Judgment Impaired  Confusion None  Danger to Self  Current suicidal ideation? Denies  Self-Injurious Behavior No self-injurious ideation or behavior indicators observed or expressed   Agreement Not to Harm Self Yes  Description of Agreement verbal  Danger to Others  Danger to Others None reported or observed   Per lab pt positive for MRSA. MD made aware contact precautions initiated. Pt afebrile overnight. Dry dressing intact and remains in place. NAD. No behavioral concerns. POC.

## 2024-05-08 NOTE — Progress Notes (Signed)
   05/07/24 2100  Psych Admission Type (Psych Patients Only)  Admission Status Involuntary  Psychosocial Assessment  Patient Complaints Anxiety  Eye Contact Avoids  Facial Expression Flat  Affect Flat  Speech Logical/coherent  Interaction Assertive  Motor Activity Slow  Appearance/Hygiene In scrubs  Behavior Characteristics Cooperative  Mood Pleasant  Thought Process  Coherency WDL  Content WDL  Delusions None reported or observed  Perception WDL  Hallucination None reported or observed  Judgment Impaired  Confusion None  Danger to Self  Current suicidal ideation? Denies  Agreement Not to Harm Self Yes  Description of Agreement verbal  Danger to Others  Danger to Others None reported or observed

## 2024-05-08 NOTE — BHH Counselor (Signed)
 CSW touched base with the intake team at [facility] at [phone] to follow up on referral sent on patient's behalf.   Intake representative reported that a decision has not been made yet.   This has been communicated to team.    CSW to continue to assess.    Alveta Kerns, MSW, LCSWA 05/08/2024 4:17 PM

## 2024-05-08 NOTE — Progress Notes (Addendum)
 As per discussion with general surgeon Dr. Lane, I went to bedside and removed 4 sutures.  Medial side of the wound has dehiscence and open up with thin bloody discharge, sent for culture.  I also tried to squeeze more on the medial side of the wound and see more discharge coming out, result informed the surgeon.  Sepsis workup in this evening are negative, plan to keep the patient on psy floor and start patient on doxycycline .  Discussed with surgeon who will stop by to see the patient tomorrow morning.  Also consult wound care.

## 2024-05-08 NOTE — Group Note (Signed)
 Date:  05/08/2024 Time:  8:58 PM  Group Topic/Focus:  Early Warning Signs:   The focus of this group is to help patients identify signs or symptoms they exhibit before slipping into an unhealthy state or crisis.  MHT Francis reviewed 15 minute rounds, explaining they would occur every 15 minutes. MHT set expectations for night rounding and informed patients not to be alarmed if they see someone looking in on them. MHT informed of the reason 15 minute rounds were conducted. MHT addressed concerns about hearing the clicking sound of doors throughout the night.  MHT Francis discussed the importance of communication with providers. MHT informed patients they needed to communicate with providers what their triggers were. MHT explained this would allow outpatient providers to help develop a crisis plan to help avoid the triggers.  MHT Francis encouraged patients to inform outpatient providers of the warning signs when they were not doing well. MHT explained this would help the provider identify their time of need and start the process of an appropriate intervention. MHT encouraged open communication with providers.  MHT Francis discussed dealing with stress. MHT encouraged patients to do things they enjoy doing to keep stress at a minimum. MHT informed group that stress can lead to burn out. MHT explained how stress can affect a person mentally and physically. MHT informed patients if they find they are not doing the things they enjoy, that could be a sign of burn out. MHT explained that by actually doing the things they enjoy, they could help reduce their stress. MHT provided examples of things to do to help keep stress at a minimum. Group listed exercise, reading, walking, playing games, playing with grandchildren, and going shopping.   MHT Francis opened group up for questions and concerns.     Participation Level:  Active  Participation Quality:  Appropriate  Affect:  Appropriate  Cognitive:   Appropriate  Insight: Appropriate  Engagement in Group:  Engaged  Modes of Intervention:  Discussion  Additional Comments:  Asked to keep group short initially. Stated he enjoyed it afterwards.  Francis JONETTA Boos 05/08/2024, 8:58 PM

## 2024-05-08 NOTE — Plan of Care (Signed)
  Problem: Education: Goal: Utilization of techniques to improve thought processes will improve Outcome: Progressing Goal: Knowledge of the prescribed therapeutic regimen will improve Outcome: Progressing   Problem: Activity: Goal: Interest or engagement in leisure activities will improve Outcome: Progressing Goal: Imbalance in normal sleep/wake cycle will improve Outcome: Progressing   Problem: Coping: Goal: Coping ability will improve Outcome: Progressing Goal: Will verbalize feelings Outcome: Progressing   Problem: Health Behavior/Discharge Planning: Goal: Ability to make decisions will improve Outcome: Progressing Goal: Compliance with therapeutic regimen will improve Outcome: Progressing   Problem: Role Relationship: Goal: Will demonstrate positive changes in social behaviors and relationships Outcome: Progressing   Problem: Safety: Goal: Ability to disclose and discuss suicidal ideas will improve Outcome: Progressing Goal: Ability to identify and utilize support systems that promote safety will improve Outcome: Progressing   Problem: Self-Concept: Goal: Will verbalize positive feelings about self Outcome: Progressing Goal: Level of anxiety will decrease Outcome: Progressing   Problem: Clinical Measurements: Goal: Ability to avoid or minimize complications of infection will improve Outcome: Progressing   Problem: Skin Integrity: Goal: Skin integrity will improve Outcome: Progressing

## 2024-05-08 NOTE — Plan of Care (Signed)
   Problem: Education: Goal: Utilization of techniques to improve thought processes will improve Outcome: Progressing Goal: Knowledge of the prescribed therapeutic regimen will improve Outcome: Progressing   Problem: Activity: Goal: Interest or engagement in leisure activities will improve Outcome: Progressing Goal: Imbalance in normal sleep/wake cycle will improve Outcome: Progressing   Problem: Coping: Goal: Coping ability will improve Outcome: Progressing Goal: Will verbalize feelings Outcome: Progressing   Problem: Health Behavior/Discharge Planning: Goal: Ability to make decisions will improve Outcome: Progressing Goal: Compliance with therapeutic regimen will improve Outcome: Progressing   Problem: Role Relationship: Goal: Will demonstrate positive changes in social behaviors and relationships Outcome: Progressing   Problem: Safety: Goal: Ability to disclose and discuss suicidal ideas will improve Outcome: Progressing Goal: Ability to identify and utilize support systems that promote safety will improve Outcome: Progressing   Problem: Self-Concept: Goal: Will verbalize positive feelings about self Outcome: Progressing Goal: Level of anxiety will decrease Outcome: Progressing

## 2024-05-08 NOTE — Progress Notes (Signed)
   05/08/24 1100  Psych Admission Type (Psych Patients Only)  Admission Status Involuntary  Psychosocial Assessment  Patient Complaints Anxiety;Worrying  Eye Contact Brief  Facial Expression Flat  Affect Flat  Speech Logical/coherent  Interaction Assertive  Motor Activity Slow  Appearance/Hygiene In scrubs  Behavior Characteristics Cooperative  Mood Pleasant  Thought Process  Coherency WDL  Content WDL  Delusions None reported or observed  Perception WDL  Hallucination None reported or observed  Judgment Impaired  Confusion None  Danger to Self  Current suicidal ideation? Denies  Agreement Not to Harm Self Yes  Description of Agreement verbal  Danger to Others  Danger to Others None reported or observed   Patient had elevated temperature. Administered Acetaminophen  for relief. Hospitalist visited with new orders. Blood cultures were obtained, abdominal Xray performed, Covid swab sent, Urine sent, Wound culture sent. Patient remains active. Hospitalist removed sutures on abdominal site and applied dressing to site. Per hospitalist, if fever continues, patient will be moved to medical for further care. Tolerated all meals. Attended all group. Will continue to monitor.

## 2024-05-08 NOTE — Group Note (Signed)
 Physical/Occupational Therapy Group Note  Group Topic: Functional, Dynamic Balance   Group Date: 05/08/2024 Start Time: 1300 End Time: 1341 Facilitators: Hailey Miles, Alm Hamilton, PT   Group Description: Group discussed impact of balance on safety and independence with functional tasks.  Identified and discussed any self-perceived balance deficits to personalize information.  Discussed and reviewed strategies to address/improve balance deficits: use of assist devices, activity pacing/energy conservation, environment/home safety modifications, focusing attention/minimizing distraction.  Reviewed and participated with standing LE therex designed to target dynamic balance reactions and LE strength/stability; provided handouts with HEP to be utilized outside of group time as appropriate.  Allowed time for questions and further discussion on any balance or mobility concerns/needs.  Therapeutic Goal(s):  Identify and discuss any individual balance deficits and functional implications. Identify and discuss any environmental/home safety modifications that can optimize balance and safety for mobility within the home. Demonstrate understanding and performance of standing therex designed to target dynamic balance deficits.  Individual Participation: Did not attend  Participation Level:   Participation Quality:   Behavior:   Speech/Thought Process:   Affect/Mood:   Insight:   Judgement:   Modes of Intervention:   Patient Response to Interventions:    Plan: Continue to engage patient in PT/OT groups 1 - 2x/week.  CHARM Hamilton Bertin PT, DPT 05/08/24, 3:40 PM
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
## 2024-05-08 NOTE — BHH Counselor (Addendum)
 Rosaline NOVAK., admissions coordinator with [facility] touched base with CSW to make aware that patient has been denied admission to program due to his recent suicidal attempt.   This was communicated to team.   CSW to continue to assess.   Thaniel Coluccio, MSW, LCSWA 05/08/2024 4:07 PM

## 2024-05-08 NOTE — Plan of Care (Signed)
  Problem: Activity: Goal: Interest or engagement in leisure activities will improve Outcome: Progressing Goal: Imbalance in normal sleep/wake cycle will improve Outcome: Progressing   Problem: Education: Goal: Utilization of techniques to improve thought processes will improve Outcome: Progressing Goal: Knowledge of the prescribed therapeutic regimen will improve Outcome: Progressing

## 2024-05-08 NOTE — Group Note (Signed)
 Date:  05/08/2024 Time:  12:03 PM  Group Topic/Focus:  Outside Rec/Music Therapy The purpose of this group is for patients to go out and get fresh air while participating in outside activities and socializing with other peers.    Participation Level:  Did Not Attend  [name] 05/08/2024, 12:03 PM
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
## 2024-05-09 DIAGNOSIS — S31109A Unspecified open wound of abdominal wall, unspecified quadrant without penetration into peritoneal cavity, initial encounter: Secondary | ICD-10-CM

## 2024-05-09 DIAGNOSIS — D696 Thrombocytopenia, unspecified: Secondary | ICD-10-CM

## 2024-05-09 DIAGNOSIS — L03311 Cellulitis of abdominal wall: Secondary | ICD-10-CM

## 2024-05-09 MED ORDER — LACTULOSE 10 GM/15ML PO SOLN
20.0000 g | Freq: Two times a day (BID) | ORAL | Status: DC
  Administered 2024-05-09 – 2024-05-27 (×28): 20 g via ORAL
  Filled 2024-05-09 (×39): qty 30

## 2024-05-09 MED ORDER — FE FUM-VIT C-VIT B12-FA 460-60-0.01-1 MG PO CAPS
1.0000 | ORAL_CAPSULE | Freq: Two times a day (BID) | ORAL | Status: DC
  Administered 2024-05-09 – 2024-05-31 (×50): 1 via ORAL
  Filled 2024-05-09 (×45): qty 1

## 2024-05-09 NOTE — Group Note (Signed)
 Recreation Therapy Group Note   Group Topic:Stress Management  Group Date: 05/09/2024 Start Time: 1500 End Time: 1600 Facilitators: Celestia Jeoffrey BRAVO, LRT, CTRS Location: Dayroom  Group Description: Stress Charades. Patients and LRT discuss the emotion "stress" and things that are associated with it. LRT encourages pts to make their own list of things that stress them out on a piece of paper provided. LRT encourages pts to identify their top 5 stressors from original list and has pts write them on smaller cut slips of paper. LRT folds and places slips into a container. Pt pulls a random slip out of the container and acts it out. LRT and pts try to guess what the peer is acting out. After all slips of paper are acted out, LRT and pts will discuss different coping skills that help them when they're feeling stressed.   Goal Area(s) Addressed: Patient will identify things that make them feel stressed.  Patient will visualize stressors by acting them out.  Patient will recognize that others experience stress. Patient will become more self-aware of stressors. Patient will discuss and learn alternative coping strategies for handling stress.   Affect/Mood: N/A   Participation Level: Did not attend    Clinical Observations/Individualized Feedback: Patient did not attend due to being on room restrictions.   Plan: Continue to engage patient in RT group sessions 2-3x/week.   Jeoffrey BRAVO Celestia, LRT, CTRS 05/09/2024 4:18 PM

## 2024-05-09 NOTE — Assessment & Plan Note (Signed)
 Mild but stable thrombocytopenia, seems chronic likely secondary to liver disease and history of alcohol  abuse. - Continue to monitor

## 2024-05-09 NOTE — Group Note (Signed)
 [facility] LCSW Group Therapy Note   Group Date: 05/09/2024 Start Time: 1330 End Time: 1420   Type of Therapy/Topic:  Group Therapy:  Emotion Regulation  Participation Level:  Did Not Attend   Mood:  Description of Group:    The purpose of this group is to assist patients in learning to regulate negative emotions and experience positive emotions. Patients will be guided to discuss ways in which they have been vulnerable to their negative emotions. These vulnerabilities will be juxtaposed with experiences of positive emotions or situations, and patients challenged to use positive emotions to combat negative ones. Special emphasis will be placed on coping with negative emotions in conflict situations, and patients will process healthy conflict resolution skills.  Therapeutic Goals: Patient will identify two positive emotions or experiences to reflect on in order to balance out negative emotions:  Patient will label two or more emotions that they find the most difficult to experience:  Patient will be able to demonstrate positive conflict resolution skills through discussion or role plays:   Summary of Patient Progress:   X    Therapeutic Modalities:   Cognitive Behavioral Therapy Feelings Identification Dialectical Behavioral Therapy   Lum JONETTA Croft, LCSWA

## 2024-05-09 NOTE — Group Note (Signed)
 Recreation Therapy Group Note   Group Topic:General Recreation  Group Date: 05/09/2024 Start Time: 1100 End Time: 1135 Facilitators: Celestia Jeoffrey BRAVO, LRT, CTRS Location: Courtyard  Group Description: Outdoor Recreation. Patients had the option to play corn hole, ring toss, bowling or listening to music while outside in the courtyard getting fresh air and sunlight. Patients helped water and prune the raised garden beds. LRT and patients discussed things that they enjoy doing in their free time outside of the hospital. LRT encouraged patients to drink water after being active and getting their heart rate up.   Goal Area(s) Addressed: Patient will identify leisure interests.  Patient will practice healthy decision making. Patient will engage in recreation activity.   Affect/Mood: N/A   Participation Level: Did not attend    Clinical Observations/Individualized Feedback: Patient is on room restrictions.   Plan: Continue to engage patient in RT group sessions 2-3x/week.   Jeoffrey BRAVO Celestia, LRT, CTRS 05/09/2024 1:58 PM

## 2024-05-09 NOTE — Plan of Care (Signed)
  Problem: Education: Goal: Knowledge of the prescribed therapeutic regimen will improve Outcome: Progressing   Problem: Coping: Goal: Coping ability will improve Outcome: Progressing   Problem: Health Behavior/Discharge Planning: Goal: Compliance with therapeutic regimen will improve Outcome: Progressing   
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
## 2024-05-09 NOTE — Group Note (Signed)
 Date:  05/09/2024 Time:  11:14 AM  Group Topic/Focus:  Goals Group/Karaoke:   The focus of this group is to help patients establish daily goals to achieve during treatment and discuss how the patient can incorporate goal setting into their daily lives to aide in recovery. Also singing along to some of their favorite songs while interacting with peers.    Participation Level:  Did Not Attend  Additional Comments:  Pt is on contact precautions   Mathius Birkeland T Zulema 05/09/2024, 11:14 AM

## 2024-05-09 NOTE — Group Note (Signed)
 Date:  05/09/2024 Time:  10:09 PM  Group Topic/Focus:  Self Care:   The focus of this group is to help patients understand the importance of self-care in order to improve or restore emotional, physical, spiritual, interpersonal, and financial health.    Participation Level:  Did Not Attend  Participation Quality:  Did Not Attend  Affect:  Did Not Attend  Cognitive:  Did Not Attend  Insight: None  Engagement in Group:  None  Modes of Intervention:  Did Not Attend  Additional Comments:  Pt on contact precautions so unable to leave room  [place] 05/09/2024, 10:09 PM
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
## 2024-05-09 NOTE — Progress Notes (Signed)
 Patient is pleasant and cooperative.  Contact precautions initiated by nursing staff until wound cultures are resulted.  Endorses anxiety and depression.  Denies SI/HI and AVH.  Pain rated 10/10 in back.    Compliant with scheduled medications. PRN medication for pain given x 2.  15 min checks in place for safety.  Patient isolates to room due to contact precautions.   Surgeon on unit this morning to assess wound and apply dressing.
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
## 2024-05-09 NOTE — Assessment & Plan Note (Signed)
 Per primary team
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
## 2024-05-10 MED ORDER — OXYCODONE HCL 5 MG PO TABS
15.0000 mg | ORAL_TABLET | ORAL | Status: DC | PRN
  Administered 2024-05-10 – 2024-05-12 (×6): 15 mg via ORAL
  Filled 2024-05-10 (×6): qty 3

## 2024-05-10 NOTE — Group Note (Signed)
 Physical/Occupational Therapy Group Note  Group Topic: Estate manager/land agent, Adaptive Equipment for ADLs  Group Date: 05/10/2024 Start Time: 1300 End Time: 1330 Facilitators: Lavonda Therisa CROME, OT   Group Description: Group educated on safety considerations/modifications with bathing, dressing and ADL routines to maximize independence and minimize fall risk with tasks.  Reviewed and demonstrated use of shower chair and tub transfer bench as appropriate.  Reviewed and demonstrated use of adaptive equipment (sock aide, reacher, long-handled sponge) available for ADL tasks.  Reviewed and demonstrated role of activity pacing and energy conservation with functional activities.  Provided handout with visual reference of available equipment.  Therapeutic Goal(s):  Verbalize and demonstrate safe technique for tub/shower transfers with DME/adaptive equipment as needed. Verbalize and demonstrate appropriate use of adaptive equipment with bathing, dressing and ADL routine. Verbalize and demonstrate appropriate use of activity pacing and energy conservation with bathing, dressing and ADL routine.  Individual Participation: Pt did not attend  Participation Level: Did not attend   Participation Quality:    Behavior:    Speech/Thought Process:   Affect/Mood:    Insight:    Judgement:   Modes of Intervention:   Patient Response to Interventions:     Plan: Continue to engage patient in PT/OT groups 1 - 2x/week.  05/10/2024  Therisa CROME Lavonda, OT   Therisa Lavonda, OTD OTR/L  05/10/24, 2:39 PM

## 2024-05-10 NOTE — Plan of Care (Signed)
   Problem: Education: Goal: Utilization of techniques to improve thought processes will improve Outcome: Progressing Goal: Knowledge of the prescribed therapeutic regimen will improve Outcome: Progressing   Problem: Activity: Goal: Interest or engagement in leisure activities will improve Outcome: Progressing Goal: Imbalance in normal sleep/wake cycle will improve Outcome: Progressing   Problem: Coping: Goal: Coping ability will improve Outcome: Progressing Goal: Will verbalize feelings Outcome: Progressing

## 2024-05-10 NOTE — Plan of Care (Signed)
  Problem: Education: Goal: Knowledge of the prescribed therapeutic regimen will improve Outcome: Progressing   Problem: Health Behavior/Discharge Planning: Goal: Compliance with therapeutic regimen will improve Outcome: Progressing
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
## 2024-05-10 NOTE — BHH Counselor (Signed)
 CSW was informed by nursing staff that pt did complete his phone screen with [facility] but that [facility] is unsure if they can take pt at this time due to him being there previously.   CSW contacted [facility] to discuss this further.   CSW was sent to [place] at [facility]. CSW unable to reach, left HIPAA compliant VM requesting return call   Lum Croft, MSW, [facility] 05/10/2024 4:16 PM

## 2024-05-10 NOTE — [facility] Counselor (Signed)
 CSW contacted [facility] in [place]  to check on pt's referral [phone]).   CSW unable to reach, unable to leave VM requesting return call.   Lum Croft, MSW, [place] 05/10/2024 4:18 PM

## 2024-05-10 NOTE — Progress Notes (Signed)
 Contact precautions in place until wound cultures are resulted.    Patient is pleasant and cooperative.  Endorses anxiety about his financial situation and discharge disposition.  Denies SI/HI and AVH.  Pain rated 9/10 in back.   Compliant with scheduled medications.  PRN medication given for pain x 2.  Patient is staying in his room due to contact precautions.  Appropriate interaction with staff.   Wound dressing changed (wound packing was not changed).

## 2024-05-10 NOTE — Group Note (Signed)
 Date:  05/10/2024 Time:  10:50 AM  Group Topic/Focus:  Fresh air Therapy outdoors with music and conversation.    Participation Level:  Did Not Attend    Norleen SHAUNNA Bias 05/10/2024, 10:50 AM
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
## 2024-05-11 DIAGNOSIS — S31109A Unspecified open wound of abdominal wall, unspecified quadrant without penetration into peritoneal cavity, initial encounter: Secondary | ICD-10-CM

## 2024-05-11 DIAGNOSIS — D696 Thrombocytopenia, unspecified: Secondary | ICD-10-CM

## 2024-05-11 DIAGNOSIS — F329 Major depressive disorder, single episode, unspecified: Secondary | ICD-10-CM

## 2024-05-11 DIAGNOSIS — R45851 Suicidal ideations: Secondary | ICD-10-CM

## 2024-05-11 MED ORDER — CIPROFLOXACIN HCL 500 MG PO TABS
500.0000 mg | ORAL_TABLET | Freq: Two times a day (BID) | ORAL | Status: DC
  Administered 2024-05-11 – 2024-05-12 (×3): 500 mg via ORAL
  Filled 2024-05-11 (×4): qty 1
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
## 2024-05-11 NOTE — Progress Notes (Signed)
   05/11/24 1100  Psych Admission Type (Psych Patients Only)  Admission Status Involuntary  Psychosocial Assessment  Patient Complaints Anxiety;Depression  Eye Contact Fair  Facial Expression Flat  Affect Appropriate to circumstance  Speech Logical/coherent  Interaction Assertive  Motor Activity Slow  Appearance/Hygiene In scrubs  Behavior Characteristics Cooperative  Mood Depressed;Pleasant  Thought Process  Coherency WDL  Content WDL  Delusions None reported or observed  Perception WDL  Hallucination None reported or observed  Judgment Impaired  Confusion None  Danger to Self  Current suicidal ideation? Denies  Agreement Not to Harm Self Yes  Description of Agreement Verbal  Danger to Others  Danger to Others None reported or observed

## 2024-05-11 NOTE — Group Note (Unsigned)
 Date:  05/11/2024 Time:  2:47 PM  Group Topic/Focus:  Movement Therapy     Participation Level:  {BHH PARTICIPATION OZCZO:77735}  Participation Quality:  {BHH PARTICIPATION QUALITY:22265}  Affect:  {BHH AFFECT:22266}  Cognitive:  {BHH COGNITIVE:22267}  Insight: {BHH Insight2:20797}  Engagement in Group:  {BHH ENGAGEMENT IN HMNLE:77731}  Modes of Intervention:  {BHH MODES OF INTERVENTION:22269}  Additional Comments:  ***  Norleen SHAUNNA Bias 05/11/2024, 2:47 PM

## 2024-05-11 NOTE — Plan of Care (Signed)
  Problem: Education: Goal: Utilization of techniques to improve thought processes will improve Outcome: Progressing Goal: Knowledge of the prescribed therapeutic regimen will improve Outcome: Progressing   Problem: Activity: Goal: Interest or engagement in leisure activities will improve Outcome: Progressing Goal: Imbalance in normal sleep/wake cycle will improve Outcome: Progressing   Problem: Coping: Goal: Coping ability will improve Outcome: Progressing   

## 2024-05-11 NOTE — Group Note (Signed)
 Date:  05/11/2024 Time:  9:31 PM  Group Topic/Focus:  Wrap-Up Group:   The focus of this group is to help patients review their daily goal of treatment and discuss progress on daily workbooks.    Participation Level:  Did Not Attend  Participation Quality:     Affect:     Cognitive:     Insight: None  Engagement in Group:  None  Modes of Intervention:     Additional Comments:    Tommas CHRISTELLA Bunker 05/11/2024, 9:31 PM

## 2024-05-11 NOTE — Group Note (Signed)
 Date:  05/11/2024 Time:  2:52 PM  Group Topic/Focus:  Movement Therapy    Participation Level:  Did Not Attend Norleen SHAUNNA Bias 05/11/2024, 2:52 PM

## 2024-05-11 NOTE — Progress Notes (Signed)
 Wound located on right lower abdomen  Wound was undressed, cleaned with sterile water, repacked, with 4x4 gauze on top and abdominal pad and sealed with tape. Patient denies any pain with dressing change.   Wound signed signature, date, timed

## 2024-05-11 NOTE — Plan of Care (Signed)
  Problem: Education: Goal: Utilization of techniques to improve thought processes will improve Outcome: Progressing Goal: Knowledge of the prescribed therapeutic regimen will improve Outcome: Progressing   Problem: Activity: Goal: Interest or engagement in leisure activities will improve Outcome: Progressing   Problem: Coping: Goal: Will verbalize feelings Outcome: Progressing   Problem: Safety: Goal: Ability to disclose and discuss suicidal ideas will improve Outcome: Progressing   Problem: Skin Integrity: Goal: Skin integrity will improve Outcome: Progressing

## 2024-05-11 NOTE — Progress Notes (Signed)
   05/10/24 2200  Psych Admission Type (Psych Patients Only)  Admission Status Involuntary  Psychosocial Assessment  Patient Complaints Anxiety  Eye Contact Fair  Facial Expression Flat  Affect Appropriate to circumstance  Speech Logical/coherent  Interaction Assertive  Motor Activity Slow  Appearance/Hygiene In scrubs  Behavior Characteristics Cooperative  Mood Pleasant  Thought Process  Coherency WDL  Content WDL  Delusions None reported or observed  Perception WDL  Hallucination None reported or observed  Judgment Impaired  Confusion None  Danger to Self  Current suicidal ideation? Denies

## 2024-05-11 NOTE — Progress Notes (Signed)
 Pt received in bed lying on his right side. Small amount of serosanguineous drainage soaked through abd pad and dressing tape loose. Site reinforced with tape. T99.1. Tylenol  650 mg po given PRN at 2200. Pt repositioned on left side, turns self. ABT therapy maintained for infection. Tol well. No adverse reaction noted. No s/s of acute distress noted. Denies SI/HI/AVH.
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
## 2024-05-11 NOTE — BH IP Treatment Plan (Signed)
 Interdisciplinary Treatment and Diagnostic Plan Update  05/11/2024 Time of Session: 11:43 AM [name] MRN: [id number]  Principal Diagnosis: MDD (major depressive disorder), recurrent episode, severe (HCC)  Secondary Diagnoses: Principal Problem:   MDD (major depressive disorder), recurrent episode, severe (HCC) Active Problems:   Abdominal wall cellulitis   Open wnd lateral abdomen   Thrombocytopenia (HCC)   Current Medications:  Current Facility-Administered Medications  Medication Dose Route Frequency Provider Last Rate Last Admin   acetaminophen  (TYLENOL ) tablet 650 mg  650 mg Oral Q6H PRN Wilkie Majel RAMAN, FNP   650 mg at 05/09/24 2021   alum & mag hydroxide-simeth (MAALOX/MYLANTA) 200-200-20 MG/5ML suspension 30 mL  30 mL Oral Q4H PRN Starkes-Perry, Majel RAMAN, FNP       amLODipine  (NORVASC ) tablet 10 mg  10 mg Oral Daily Wilkie Majel RAMAN, FNP   10 mg at 05/11/24 9072   artificial tears ophthalmic solution 1 drop  1 drop Both Eyes PRN Jadapalle, Sree, MD   1 drop at 05/11/24 9066   baclofen  (LIORESAL ) tablet 5 mg  5 mg Oral TID Jadapalle, Sree, MD   5 mg at 05/11/24 9072   docusate sodium  (COLACE) capsule 100 mg  100 mg Oral BID Wilkie Majel RAMAN, FNP   100 mg at 05/11/24 9072   doxycycline  (VIBRA -TABS) tablet 100 mg  100 mg Oral Q12H Laurita Manor T, MD   100 mg at 05/11/24 0928   Fe Fum-Vit C-Vit B12-FA (TRIGELS-F FORTE) capsule 1 capsule  1 capsule Oral BID Amin, Sumayya, MD   1 capsule at 05/11/24 9066   gabapentin  (NEURONTIN ) capsule 300 mg  300 mg Oral TID Jadapalle, Sree, MD   300 mg at 05/11/24 9072   lactulose  (CHRONULAC ) 10 GM/15ML solution 20 g  20 g Oral BID Amin, Sumayya, MD   20 g at 05/11/24 9072   melatonin tablet 5 mg  5 mg Oral QHS PRN Wilkie Majel RAMAN, FNP   5 mg at 05/08/24 2153   mirtazapine  (REMERON ) tablet 15 mg  15 mg Oral QHS Starkes-Perry, Takia S, FNP   15 mg at 05/10/24 2144   multivitamin with minerals tablet 1 tablet  1 tablet  Oral Daily Wilkie Majel RAMAN, FNP   1 tablet at 05/11/24 9072   nystatin  cream (MYCOSTATIN )   Topical BID Wilkie Majel RAMAN, FNP   1 Application at 05/10/24 2146   OLANZapine  (ZYPREXA ) injection 5 mg  5 mg Intramuscular TID PRN Wilkie Majel RAMAN, FNP       OLANZapine  zydis (ZYPREXA ) disintegrating tablet 5 mg  5 mg Oral TID PRN Wilkie Majel RAMAN, FNP       oxyCODONE  (Oxy IR/ROXICODONE ) immediate release tablet 15 mg  15 mg Oral Q4H PRN Amin, Sumayya, MD   15 mg at 05/11/24 9175   pantoprazole  (PROTONIX ) EC tablet 40 mg  40 mg Oral Daily Jadapalle, Sree, MD   40 mg at 05/11/24 9071   polyethylene glycol (MIRALAX  / GLYCOLAX ) packet 17 g  17 g Oral Daily PRN Wilkie Majel RAMAN, FNP       QUEtiapine  (SEROQUEL ) tablet 100 mg  100 mg Oral QHS Jadapalle, Sree, MD   100 mg at 05/10/24 2147   rosuvastatin  (CRESTOR ) tablet 20 mg  20 mg Oral Daily Jadapalle, Sree, MD   20 mg at 05/11/24 9072   sertraline  (ZOLOFT ) tablet 50 mg  50 mg Oral Daily Jadapalle, Sree, MD   50 mg at 05/11/24 9072   tamsulosin  (FLOMAX ) capsule 0.4 mg  0.4 mg  Oral Daily Jadapalle, Sree, MD   0.4 mg at 05/11/24 9072   PTA Medications: Medications Prior to Admission  Medication Sig Dispense Refill Last Dose/Taking   amLODipine  (NORVASC ) 10 MG tablet Take 10 mg by mouth daily.      [Paused] Baclofen  5 MG TABS Take 1 tablet by mouth in the morning, at noon, and at bedtime.      [Paused] diltiazem (CARDIZEM) 60 MG tablet Take 60 mg by mouth 3 (three) times daily. (Patient not taking: Reported on 05/01/2024)      docusate sodium  (COLACE) 100 MG capsule Take 1 capsule (100 mg total) by mouth 2 (two) times daily as needed for mild constipation.      folic acid  (FOLVITE ) 1 MG tablet Take 1 mg by mouth daily. (Patient not taking: Reported on 05/01/2024)      [Paused] furosemide (LASIX) 80 MG tablet Take 80 mg by mouth daily. (Patient not taking: Reported on 05/01/2024)      [Paused] gabapentin  (NEURONTIN ) 300 MG capsule  Take 300 mg by mouth 3 (three) times daily.      [Paused] hydrochlorothiazide (HYDRODIURIL) 12.5 MG tablet Take 12.5 mg by mouth daily as needed (swelling). (Patient not taking: Reported on 05/01/2024)      [Paused] hydrOXYzine (VISTARIL) 25 MG capsule Take 25 mg by mouth 2 (two) times daily. (Patient not taking: Reported on 05/01/2024)      [Paused] lactulose  (CHRONULAC ) 10 GM/15ML solution Take 20 g by mouth 2 (two) times daily. (Patient not taking: Reported on 05/01/2024)      [Paused] losartan  (COZAAR ) 25 MG tablet Take 12.5 mg by mouth every morning. (Patient not taking: Reported on 05/01/2024)      methocarbamol  (ROBAXIN ) 500 MG tablet Take 1 tablet (500 mg total) by mouth every 8 (eight) hours as needed for muscle spasms.      [Paused] mirtazapine  (REMERON ) 15 MG tablet Take 15 mg by mouth at bedtime. (Patient not taking: Reported on 05/01/2024)      Multiple Vitamin (MULTIVITAMIN WITH MINERALS) TABS tablet Take 1 tablet by mouth daily.      nystatin  cream (MYCOSTATIN ) Apply topically 2 (two) times daily as needed for dry skin. 30 g 0    oxyCODONE  (OXY IR/ROXICODONE ) 5 MG immediate release tablet Take 1-2 tablets (5-10 mg total) by mouth every 4 (four) hours as needed (5mg  for moderate pain, 10mg  for severe pain).      [Paused] pantoprazole  (PROTONIX ) 40 MG tablet Take 40 mg by mouth daily. (Patient not taking: Reported on 05/01/2024)      polyethylene glycol (MIRALAX  / GLYCOLAX ) 17 g packet Take 17 g by mouth daily as needed (constipation).      [Paused] QUEtiapine  (SEROQUEL ) 200 MG tablet Take 200 mg by mouth at bedtime. (Patient not taking: Reported on 05/01/2024)      [Paused] rosuvastatin  (CRESTOR ) 20 MG tablet Take 20 mg by mouth daily. (Patient not taking: Reported on 05/01/2024)      [Paused] spironolactone (ALDACTONE) 100 MG tablet Take 100 mg by mouth daily. (Patient not taking: Reported on 05/01/2024)      [Paused] tamsulosin  (FLOMAX ) 0.4 MG CAPS capsule Take 0.4 mg by mouth daily. (Patient  not taking: Reported on 05/01/2024)      thiamine  (VITAMIN B-1) 100 MG tablet Take 1 tablet (100 mg total) by mouth daily.      [Paused] traZODone (DESYREL) 50 MG tablet Take 50 mg by mouth at bedtime. (Patient not taking: Reported on 05/01/2024)  Patient Stressors: Other: housing     Patient Strengths: Ability for insight  Active sense of humor  Capable of independent living  Communication skills   Treatment Modalities: Medication Management, Group therapy, Case management,  1 to 1 session with clinician, Psychoeducation, Recreational therapy.   Physician Treatment Plan for Primary Diagnosis: MDD (major depressive disorder), recurrent episode, severe (HCC) Long Term Goal(s): Improvement in symptoms so as ready for discharge   Short Term Goals: Ability to identify changes in lifestyle to reduce recurrence of condition will improve Ability to verbalize feelings will improve Ability to disclose and discuss suicidal ideas Ability to demonstrate self-control will improve Ability to identify and develop effective coping behaviors will improve  Medication Management: Evaluate patient's response, side effects, and tolerance of medication regimen.  Therapeutic Interventions: 1 to 1 sessions, Unit Group sessions and Medication administration.  Evaluation of Outcomes: Progressing  Physician Treatment Plan for Secondary Diagnosis: Principal Problem:   MDD (major depressive disorder), recurrent episode, severe (HCC) Active Problems:   Abdominal wall cellulitis   Open wnd lateral abdomen   Thrombocytopenia (HCC)  Long Term Goal(s): Improvement in symptoms so as ready for discharge   Short Term Goals: Ability to identify changes in lifestyle to reduce recurrence of condition will improve Ability to verbalize feelings will improve Ability to disclose and discuss suicidal ideas Ability to demonstrate self-control will improve Ability to identify and develop effective coping behaviors  will improve     Medication Management: Evaluate patient's response, side effects, and tolerance of medication regimen.  Therapeutic Interventions: 1 to 1 sessions, Unit Group sessions and Medication administration.  Evaluation of Outcomes: Progressing   RN Treatment Plan for Primary Diagnosis: MDD (major depressive disorder), recurrent episode, severe (HCC) Long Term Goal(s): Knowledge of disease and therapeutic regimen to maintain health will improve  Short Term Goals: Ability to remain free from injury will improve, Ability to verbalize frustration and anger appropriately will improve, Ability to demonstrate self-control, Ability to participate in decision making will improve, Ability to verbalize feelings will improve, Ability to disclose and discuss suicidal ideas, Ability to identify and develop effective coping behaviors will improve, Compliance with prescribed medications will improve, and    Medication Management: RN will administer medications as ordered by provider, will assess and evaluate patient's response and provide education to patient for prescribed medication. RN will report any adverse and/or side effects to prescribing provider.  Therapeutic Interventions: 1 on 1 counseling sessions, Psychoeducation, Medication administration, Evaluate responses to treatment, Monitor vital signs and CBGs as ordered, Perform/monitor CIWA, COWS, AIMS and Fall Risk screenings as ordered, Perform wound care treatments as ordered.  Evaluation of Outcomes: Progressing   LCSW Treatment Plan for Primary Diagnosis: MDD (major depressive disorder), recurrent episode, severe (HCC) Long Term Goal(s): Safe transition to appropriate next level of care at discharge, Engage patient in therapeutic group addressing interpersonal concerns.  Short Term Goals: Engage patient in aftercare planning with referrals and resources, Increase social support, Increase ability to appropriately verbalize feelings,  Increase emotional regulation, Facilitate acceptance of mental health diagnosis and concerns, Facilitate patient progression through stages of change regarding substance use diagnoses and concerns, Identify triggers associated with mental health/substance abuse issues, and Increase skills for wellness and recovery  Therapeutic Interventions: Assess for all discharge needs, 1 to 1 time with Social worker, Explore available resources and support systems, Assess for adequacy in community support network, Educate family and significant other(s) on suicide prevention, Complete Psychosocial Assessment, Interpersonal group therapy.  Evaluation of Outcomes:  Progressing   Progress in Treatment: Attending groups: Yes. Participating in groups: Yes. Taking medication as prescribed: Yes. Toleration medication: Yes. Family/Significant other contact made: No, will contact:  make contact once permission is giving. Patient understands diagnosis: Yes. Discussing patient identified problems/goals with staff: Yes. Medical problems stabilized or resolved: Yes. Denies suicidal/homicidal ideation: Yes. Issues/concerns per patient self-inventory: No. Other: none  New problem(s) identified: No, Describe:  None identified update 05/11/2024 no changes at this time.   New Short Term/Long Term Goal(s): elimination of symptoms of psychosis, medication management for mood stabilization; elimination of SI thoughts; development of comprehensive mental wellness plan.    Patient Goals:  I'd like to go to one of these long-term things07/26/2025 no changes at this time.   Discharge Plan or Barriers: CSW will assist with appropriate discharge planning 05/11/2024 no changes at this time.   Reason for Continuation of Hospitalization: Depression Medication stabilization   Estimated Length of Stay: 1 to 7 days07/26/2025 TBD  Last 3 [place] Suicide Severity Risk Score: Flowsheet Row Admission (Current) from 05/03/2024 in  [facility]IORAL [facility]ICINE  C-SSRS RISK CATEGORY High Risk    Last PHQ 2/9 Scores:     No data to display          Scribe for Treatment Team: Donnice LELON Favor, LCSW 05/11/2024 11:43 AM

## 2024-05-12 MED ORDER — OXYCODONE HCL 5 MG PO TABS
15.0000 mg | ORAL_TABLET | Freq: Four times a day (QID) | ORAL | Status: DC | PRN
  Administered 2024-05-12 – 2024-05-26 (×46): 15 mg via ORAL
  Filled 2024-05-12 (×46): qty 3

## 2024-05-12 MED ORDER — CIPROFLOXACIN HCL 500 MG PO TABS
500.0000 mg | ORAL_TABLET | Freq: Two times a day (BID) | ORAL | Status: AC
  Administered 2024-05-12 – 2024-05-19 (×14): 500 mg via ORAL
  Filled 2024-05-12 (×14): qty 1

## 2024-05-12 NOTE — Progress Notes (Signed)
   05/12/24 2140  Psych Admission Type (Psych Patients Only)  Admission Status Involuntary  Psychosocial Assessment  Patient Complaints Depression  Eye Contact Fair  Facial Expression Flat  Affect Appropriate to circumstance  Speech Logical/coherent  Interaction Assertive  Motor Activity Slow  Appearance/Hygiene In scrubs;Unremarkable  Behavior Characteristics Cooperative;Appropriate to situation  Mood Depressed  Thought Process  Coherency WDL  Content WDL  Delusions None reported or observed  Perception WDL  Hallucination None reported or observed  Judgment Impaired  Confusion None  Danger to Self  Current suicidal ideation? Denies  Self-Injurious Behavior No self-injurious ideation or behavior indicators observed or expressed   Agreement Not to Harm Self Yes  Description of Agreement verbal  Danger to Others  Danger to Others None reported or observed

## 2024-05-12 NOTE — Plan of Care (Signed)
  Problem: Education: Goal: Utilization of techniques to improve thought processes will improve Outcome: Progressing Goal: Knowledge of the prescribed therapeutic regimen will improve Outcome: Progressing   Problem: Activity: Goal: Interest or engagement in leisure activities will improve Outcome: Progressing Goal: Imbalance in normal sleep/wake cycle will improve Outcome: Progressing   Problem: Coping: Goal: Coping ability will improve Outcome: Progressing Goal: Will verbalize feelings Outcome: Progressing   Problem: Health Behavior/Discharge Planning: Goal: Ability to make decisions will improve Outcome: Progressing Goal: Compliance with therapeutic regimen will improve Outcome: Progressing   Problem: Role Relationship: Goal: Will demonstrate positive changes in social behaviors and relationships Outcome: Progressing   Problem: Safety: Goal: Ability to disclose and discuss suicidal ideas will improve Outcome: Progressing Goal: Ability to identify and utilize support systems that promote safety will improve Outcome: Progressing   Problem: Self-Concept: Goal: Will verbalize positive feelings about self Outcome: Progressing Goal: Level of anxiety will decrease Outcome: Progressing   Problem: Clinical Measurements: Goal: Ability to avoid or minimize complications of infection will improve Outcome: Progressing   Problem: Skin Integrity: Goal: Skin integrity will improve Outcome: Progressing

## 2024-05-12 NOTE — Group Note (Signed)
 Date:  05/12/2024 Time:  9:38 PM  Group Topic/Focus:  Wrap-Up Group:   The focus of this group is to help patients review their daily goal of treatment and discuss progress on daily workbooks.    Participation Level:  Did Not Attend  Participation Quality:     Affect:     Cognitive:     Insight: None  Engagement in Group:  None  Modes of Intervention:     Additional Comments:    Tommas CHRISTELLA Bunker 05/12/2024, 9:38 PM
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
## 2024-05-12 NOTE — Progress Notes (Signed)
 Per MD Amin on rounds today, dressing to be changed daily. Patient wished to take shower before dressing change. After completion of shower, this RN performed wound care as directed per wound care instructions. Moderate amount of purulent drainage noted on previous dressing. Patient tolerated procedure well.

## 2024-05-12 NOTE — Progress Notes (Signed)
   05/11/24 2200  Psych Admission Type (Psych Patients Only)  Admission Status Involuntary  Psychosocial Assessment  Patient Complaints Depression  Eye Contact Fair  Facial Expression Flat  Affect Appropriate to circumstance  Speech Logical/coherent  Interaction Assertive  Motor Activity Slow  Appearance/Hygiene In scrubs  Behavior Characteristics Cooperative  Mood Pleasant  Thought Process  Coherency WDL  Content WDL  Delusions None reported or observed  Perception WDL  Hallucination None reported or observed  Judgment Impaired  Confusion None  Danger to Self  Current suicidal ideation? Denies

## 2024-05-12 NOTE — Group Note (Signed)
 Date:  05/12/2024 Time:  5:47 PM  Group Topic/Focus:  Healthy Communication:   The focus of this group is to discuss communication, barriers to communication, as well as healthy ways to communicate with others. Making Healthy Choices:   The focus of this group is to help patients identify negative/unhealthy choices they were using prior to admission and identify positive/healthier coping strategies to replace them upon discharge. This group focused on healthy eating. We discussed healthy recipes, sweet treats, snacks, drinks and tips to improve the patients overall daily lifestyle.     Participation Level:  Did Not Attend  Participation Quality:    Affect:    Cognitive:    Insight:   Engagement in Group:    Modes of Intervention:    Additional Comments:    [name] [name] 05/12/2024, 5:47 PM

## 2024-05-12 NOTE — Group Note (Signed)
 LCSW Group Therapy Note  Group Date: 05/12/2024 Start Time: 1020 End Time: 1120   Type of Therapy and Topic:  Group Therapy - Healthy vs Unhealthy Coping Skills  Participation Level:  Did Not Attend    [name] 05/12/2024  11:33 AM

## 2024-05-12 NOTE — Plan of Care (Signed)
  Problem: Education: Goal: Utilization of techniques to improve thought processes will improve Outcome: Progressing   Problem: Coping: Goal: Will verbalize feelings Outcome: Progressing

## 2024-05-12 NOTE — Plan of Care (Signed)
   Problem: Education: Goal: Utilization of techniques to improve thought processes will improve Outcome: Progressing Goal: Knowledge of the prescribed therapeutic regimen will improve Outcome: Progressing   Problem: Activity: Goal: Interest or engagement in leisure activities will improve Outcome: Progressing Goal: Imbalance in normal sleep/wake cycle will improve Outcome: Progressing   Problem: Coping: Goal: Coping ability will improve Outcome: Progressing Goal: Will verbalize feelings Outcome: Progressing
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
## 2024-05-13 LAB — CULTURE, BLOOD (ROUTINE X 2)
Culture: NO GROWTH
Culture: NO GROWTH
Special Requests: ADEQUATE
Special Requests: ADEQUATE

## 2024-05-13 NOTE — Plan of Care (Signed)
  Problem: Education: Goal: Utilization of techniques to improve thought processes will improve Outcome: Progressing Goal: Knowledge of the prescribed therapeutic regimen will improve Outcome: Progressing   Problem: Activity: Goal: Interest or engagement in leisure activities will improve Outcome: Progressing Goal: Imbalance in normal sleep/wake cycle will improve Outcome: Progressing   Problem: Coping: Goal: Coping ability will improve Outcome: Progressing Goal: Will verbalize feelings Outcome: Progressing   Problem: Health Behavior/Discharge Planning: Goal: Ability to make decisions will improve Outcome: Progressing Goal: Compliance with therapeutic regimen will improve Outcome: Progressing   Problem: Role Relationship: Goal: Will demonstrate positive changes in social behaviors and relationships Outcome: Progressing   Problem: Safety: Goal: Ability to disclose and discuss suicidal ideas will improve Outcome: Progressing Goal: Ability to identify and utilize support systems that promote safety will improve Outcome: Progressing   Problem: Self-Concept: Goal: Will verbalize positive feelings about self Outcome: Progressing Goal: Level of anxiety will decrease Outcome: Progressing   Problem: Clinical Measurements: Goal: Ability to avoid or minimize complications of infection will improve Outcome: Progressing   Problem: Skin Integrity: Goal: Skin integrity will improve Outcome: Progressing

## 2024-05-13 NOTE — Progress Notes (Signed)
   05/13/24 2200  Psych Admission Type (Psych Patients Only)  Admission Status Involuntary  Psychosocial Assessment  Patient Complaints Depression  Eye Contact Fair  Facial Expression Flat  Affect Appropriate to circumstance  Speech Logical/coherent  Interaction Assertive  Motor Activity Slow  Appearance/Hygiene Unremarkable  Behavior Characteristics Cooperative;Appropriate to situation  Mood Depressed  Thought Process  Coherency WDL  Content WDL  Delusions None reported or observed  Perception WDL  Hallucination None reported or observed  Judgment Impaired  Confusion None  Danger to Self  Current suicidal ideation? Denies  Self-Injurious Behavior No self-injurious ideation or behavior indicators observed or expressed   Agreement Not to Harm Self Yes  Description of Agreement verbal  Danger to Others  Danger to Others None reported or observed

## 2024-05-13 NOTE — Progress Notes (Signed)
 Patient is pleasant and cooperative.  Contact precautions in place for MRSA in abdominal wound.  Denies SI/HI and AVH.  Reports his anxiety and depression is same as always.  Pain rated 10/10 in back.    Compliant with scheduled medications.  PRN medication given for pain x 2.  15 min checks in place for safety.  Patient must isolate to room due to contact precautions.  Appropriate interaction with staff.

## 2024-05-13 NOTE — Group Note (Signed)
 Recreation Therapy Group Note   Group Topic:Coping Skills  Group Date: 05/13/2024 Start Time: 1405 End Time: 1500 Facilitators: Celestia Jeoffrey BRAVO, LRT, CTRS Location: Dayroom  Group Description: Coping A-Z. LRT and patients engage in a guided discussion on what coping skills are and gave specific examples. LRT passed out a handout labeled Coping A-Z with blank spaces beside each letter. LRT prompted patients to come up with a coping skill for each of the letters. LRT and patients went over the handout and gave ideas for each letter if anyone had any blanks left on their paper. Patients kept this handout with them that listed 26 different coping skills.   Goal Area(s) Addressed: Patients will be able to define "coping skills". Patient will identify new coping skills.  Patient will increase communication.   Affect/Mood: N/A   Participation Level: Did not attend    Clinical Observations/Individualized Feedback: Patient did not attend group.   Plan: Continue to engage patient in RT group sessions 2-3x/week.   Jeoffrey BRAVO Celestia, LRT, CTRS 05/13/2024 4:41 PM

## 2024-05-13 NOTE — Plan of Care (Signed)
  Problem: Education: Goal: Knowledge of the prescribed therapeutic regimen will improve Outcome: Progressing   Problem: Coping: Goal: Coping ability will improve Outcome: Progressing   Problem: Health Behavior/Discharge Planning: Goal: Compliance with therapeutic regimen will improve Outcome: Progressing   

## 2024-05-13 NOTE — Group Note (Signed)
 Date:  05/13/2024 Time:  9:49 AM  Group Topic/Focus:  Self Care:   The focus of this group is to help patients understand the importance of self-care in order to improve or restore emotional, physical, spiritual, interpersonal, and financial health.    Participation Level:  Did Not Attend   [name] 05/13/2024, 9:49 AM
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
## 2024-05-14 LAB — AEROBIC/ANAEROBIC CULTURE W GRAM STAIN (SURGICAL/DEEP WOUND)

## 2024-05-14 NOTE — Group Note (Signed)
 Date:  05/14/2024 Time:  8:45 PM  Group Topic/Focus:  Self Care:   The focus of this group is to help patients understand the importance of self-care in order to improve or restore emotional, physical, spiritual, interpersonal, and financial health.  MHT Francis made introductions, completed an ice breaker, and informed patients of the expectations of the unit. MHT Francis informed patients of 15 minute checks and not to be alarmed if they see someone looking in.  MHT Francis discussed self care and the plan to address needs upon discharge. MHT informed they have structure while in the hospital and would need a plan in place to care for self upon discharge. MHT discussed natural supports and being linked to outpatient treatment. MHT explained the importance of attending follow up appointments and taking medications as prescribed. MHT informed patients how to get linked with outpatient treatment. MHT provided group with information to locate services in the area they live in via 211. MHT explained how the service worked and how they could link to services they needed.  Group was interactive and engaged throughout group.  Participation Level:  Active  Participation Quality:  Appropriate  Affect:  Appropriate  Cognitive:  Appropriate  Insight: Appropriate  Engagement in Group:  Engaged  Modes of Intervention:  Discussion  Additional Comments:    Francis JONETTA Boos 05/14/2024, 8:45 PM

## 2024-05-14 NOTE — Group Note (Signed)
 LCSW Group Therapy Note  Group Date: 05/14/2024 Start Time: 1315 End Time: 1335   Type of Therapy and Topic:  Group Therapy - Healthy vs Unhealthy Coping Skills  Participation Level:  Did Not Attend   Description of Group The focus of this group was to determine what unhealthy coping techniques typically are used by group members and what healthy coping techniques would be helpful in coping with various problems. Patients were guided in becoming aware of the differences between healthy and unhealthy coping techniques. Patients were asked to identify 2-3 healthy coping skills they would like to learn to use more effectively.  Therapeutic Goals Patients learned that coping is what human beings do all day long to deal with various situations in their lives Patients defined and discussed healthy vs unhealthy coping techniques Patients identified their preferred coping techniques and identified whether these were healthy or unhealthy Patients determined 2-3 healthy coping skills they would like to become more familiar with and use more often. Patients provided support and ideas to each other   Summary of Patient Progress:  X   Therapeutic Modalities Cognitive Behavioral Therapy Motivational Interviewing  Lum JONETTA Croft, [place] 05/14/2024  1:34 PM

## 2024-05-14 NOTE — Plan of Care (Signed)
  Problem: Education: Goal: Knowledge of the prescribed therapeutic regimen will improve Outcome: Progressing   Problem: Coping: Goal: Coping ability will improve Outcome: Progressing   

## 2024-05-14 NOTE — Progress Notes (Signed)
 Contact precautions in place due to MRSA in abdominal wound.    Patient is pleasant and cooperative.  Endorses chronic depression and anxiety regarding discharge/ placement.  Denies SI/HI and AVH.  Pain rated 10/10 in back.    Compliant with scheduled medications. PRN pain medication given x 2.  15 min checks in place for safety.  Patient unable to interact with peers due to precautions.  Appropriate interaction with peers.
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
## 2024-05-14 NOTE — Group Note (Signed)
 Recreation Therapy Group Note   Group Topic:Health and Wellness  Group Date: 05/14/2024 Start Time: 1405 End Time: 1440 Facilitators: Celestia Jeoffrey BRAVO, LRT, CTRS Location: Dayroom  Group Description: Seated Exercise. LRT discussed the mental and physical benefits of exercise. LRT and group discussed how physical activity can be used as a coping skill. Pt's and LRT followed along to an exercise video on the TV screen that provided a visual representation and audio description of every exercise performed. Pt's encouraged to listen to their bodies and stop at any time if they experience feelings of discomfort or pain. Pts were encouraged to drink water and stay hydrated.   Goal Area(s) Addressed: Patient will learn benefits of physical activity. Patient will identify exercise as a coping skill.  Patient will follow multistep directions. Patient will try a new leisure interest.    Affect/Mood: N/A   Participation Level: Did not attend    Clinical Observations/Individualized Feedback: Patient did not attend group.   Plan: Continue to engage patient in RT group sessions 2-3x/week.   Jeoffrey BRAVO Celestia, LRT, CTRS 05/14/2024 3:57 PM

## 2024-05-14 NOTE — Progress Notes (Signed)
 This RN offered patient wound care at this time, patient deferred and wished to wait until the morning.

## 2024-05-15 NOTE — BHH Counselor (Signed)
 CSW received missed call from [facility]. [facility] reports at this time they are not sure is pt can come back and are waiting on their clinical team to make the decision.   Sherry at [facility] reports she will contact CSW once she has an answer from the clinical team  Lum Croft, MSW, [facility] 05/17/2024 12:09 PM

## 2024-05-15 NOTE — Plan of Care (Signed)
  Problem: Education: Goal: Utilization of techniques to improve thought processes will improve Outcome: Progressing   Problem: Coping: Goal: Will verbalize feelings Outcome: Progressing   Problem: Health Behavior/Discharge Planning: Goal: Compliance with therapeutic regimen will improve Outcome: Progressing

## 2024-05-15 NOTE — Group Note (Signed)
 Date:  05/15/2024 Time:  12:06 PM  Group Topic/Focus:  Wellness Toolbox:   The focus of this group is to discuss various aspects of wellness, balancing those aspects and exploring ways to increase the ability to experience wellness.  Patients will create a wellness toolbox for use upon discharge.    Participation Level:  Did Not Attend  Participation Quality:    Affect:    Cognitive:    Insight:   Engagement in Group:    Modes of Intervention:    Additional Comments:    [name] 05/15/2024, 12:06 PM

## 2024-05-15 NOTE — Group Note (Signed)
 Physical/Occupational Therapy Group Note  Group Topic: Transfer Training   Group Date: 05/15/2024 Start Time: 1310 End Time: 1336 Facilitators: Denney Shein, Alm Hamilton, PT   Group Description: Group educated on sequence and techniques to maximize safety with functional transfers.  Additionally, integrated education on impact of seating surfaces, use of assistive device and management of orthostasis with movement transitions.  Patients actively engaged with functional transfers (sit/stand) from various seating surfaces, with and without assist devices, working to integrate and retain education provided during session.  Allowed time for questions and further discussion on mobility concerns/needs.   Therapeutic Goal(s):  Identify and demonstrate safe technique for sit/stand transfers from various seating surfaces. Identify and demonstrate safe use of assistive devices with basic transfers and simple mobility. Identify and demonstrate ability to recognize signs/symptoms of orthostasis and appropriate compensatory/safety techniques.  Individual Participation: Did not attend  Participation Level:   Participation Quality:   Behavior:   Speech/Thought Process:   Affect/Mood:   Insight:   Judgement:   Individualization:   Modes of Intervention:   Patient Response to Interventions:    Plan: Continue to engage patient in PT/OT groups 1 - 2x/week.  CHARM Hamilton Bertin PT, DPT 05/15/24, 1:48 PM
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
## 2024-05-15 NOTE — Progress Notes (Signed)
   05/15/24 1200  Psych Admission Type (Psych Patients Only)  Admission Status Voluntary  Psychosocial Assessment  Patient Complaints Depression;Irritability  Eye Contact Fair  Facial Expression Worried  Affect Irritable  Speech Logical/coherent  Interaction Assertive  Motor Activity Slow  Appearance/Hygiene In scrubs  Behavior Characteristics Irritable  Mood Preoccupied;Irritable  Thought Process  Coherency WDL  Content WDL  Delusions None reported or observed  Perception WDL  Hallucination None reported or observed  Judgment Poor  Confusion WDL  Danger to Self  Current suicidal ideation? Denies  Agreement Not to Harm Self Yes  Description of Agreement verbal  Danger to Others  Danger to Others None reported or observed   Dressing change performed as ordered. Patient tolerated dressing change. Medicated for pain prior to dressing change.

## 2024-05-15 NOTE — BHH Counselor (Signed)
 CSW contacted [facility] in [place]  [phone])  CSW reached intake line.   Intake line reports that pt's insurance is out of network with all [facility] facilities.   Lum Croft, MSW, [place] 05/15/2024 11:21 AM

## 2024-05-15 NOTE — BHH Counselor (Signed)
 CSW contacted [facility] to check on pt referral again.  CSW was sent to [place] at [facility]. CSW unable to reach, left HIPAA compliant VM requesting return call.  Lum Croft, MSW, [place] 05/15/2024 11:17 AM

## 2024-05-15 NOTE — BHH Counselor (Signed)
 CSW contacted pt's insurance company to inquire about case management for pt.    They reports that pt has [employer].    They report pt's case manager is [name].  [phone])  CSW attempted to contact [name] C. CSW unable to reach number out of service.    [name], MSW, [place] 05/21/2024 2:15 PM

## 2024-05-15 NOTE — BHH Counselor (Signed)
 CSW staffed case with [facility] leadership. CSW made leadership aware that pt may be difficult to transition.   Lum Croft, MSW, [place] 05/15/2024 11:23 AM

## 2024-05-15 NOTE — Plan of Care (Signed)
  Problem: Education: Goal: Utilization of techniques to improve thought processes will improve Outcome: Progressing Goal: Knowledge of the prescribed therapeutic regimen will improve Outcome: Progressing   Problem: Activity: Goal: Interest or engagement in leisure activities will improve Outcome: Progressing Goal: Imbalance in normal sleep/wake cycle will improve Outcome: Progressing   

## 2024-05-16 NOTE — BH IP Treatment Plan (Signed)
 Interdisciplinary Treatment and Diagnostic Plan Update  05/16/2024 Time of Session: 3:00 PM  [name] MRN: [id number]  Principal Diagnosis: MDD (major depressive disorder), recurrent episode, severe (HCC)  Secondary Diagnoses: Principal Problem:   MDD (major depressive disorder), recurrent episode, severe (HCC) Active Problems:   Abdominal wall cellulitis   Open wnd lateral abdomen   Thrombocytopenia (HCC)   Current Medications:  Current Facility-Administered Medications  Medication Dose Route Frequency Provider Last Rate Last Admin   acetaminophen  (TYLENOL ) tablet 650 mg  650 mg Oral Q6H PRN Starkes-Perry, Takia S, FNP   650 mg at 05/12/24 1447   alum & mag hydroxide-simeth (MAALOX/MYLANTA) 200-200-20 MG/5ML suspension 30 mL  30 mL Oral Q4H PRN Starkes-Perry, Majel RAMAN, FNP       amLODipine  (NORVASC ) tablet 10 mg  10 mg Oral Daily Wilkie Majel RAMAN, FNP   10 mg at 05/16/24 0931   artificial tears ophthalmic solution 1 drop  1 drop Both Eyes PRN Jadapalle, Sree, MD   1 drop at 05/14/24 1712   baclofen  (LIORESAL ) tablet 5 mg  5 mg Oral TID Jadapalle, Sree, MD   5 mg at 05/16/24 1539   ciprofloxacin  (CIPRO ) tablet 500 mg  500 mg Oral BID Amin, Sumayya, MD   500 mg at 05/16/24 9251   docusate sodium  (COLACE) capsule 100 mg  100 mg Oral BID Starkes-Perry, Takia S, FNP   100 mg at 05/16/24 0932   Fe Fum-Vit C-Vit B12-FA (TRIGELS-F FORTE) capsule 1 capsule  1 capsule Oral BID Amin, Sumayya, MD   1 capsule at 05/16/24 0932   gabapentin  (NEURONTIN ) capsule 300 mg  300 mg Oral TID Jadapalle, Sree, MD   300 mg at 05/16/24 1539   lactulose  (CHRONULAC ) 10 GM/15ML solution 20 g  20 g Oral BID Amin, Sumayya, MD   20 g at 05/16/24 0934   melatonin tablet 5 mg  5 mg Oral QHS PRN Wilkie Majel RAMAN, FNP   5 mg at 05/12/24 2104   mirtazapine  (REMERON ) tablet 15 mg  15 mg Oral QHS Starkes-Perry, Takia S, FNP   15 mg at 05/15/24 2203   multivitamin with minerals tablet 1 tablet  1 tablet Oral  Daily Starkes-Perry, Takia S, FNP   1 tablet at 05/16/24 9065   nystatin  cream (MYCOSTATIN )   Topical BID Wilkie Majel RAMAN, FNP   Given at 05/16/24 9053   OLANZapine  (ZYPREXA ) injection 5 mg  5 mg Intramuscular TID PRN Starkes-Perry, Takia S, FNP       OLANZapine  zydis (ZYPREXA ) disintegrating tablet 5 mg  5 mg Oral TID PRN Wilkie Majel RAMAN, FNP       oxyCODONE  (Oxy IR/ROXICODONE ) immediate release tablet 15 mg  15 mg Oral Q6H PRN Amin, Sumayya, MD   15 mg at 05/16/24 1237   pantoprazole  (PROTONIX ) EC tablet 40 mg  40 mg Oral Daily Jadapalle, Sree, MD   40 mg at 05/16/24 0933   polyethylene glycol (MIRALAX  / GLYCOLAX ) packet 17 g  17 g Oral Daily PRN Starkes-Perry, Takia S, FNP       QUEtiapine  (SEROQUEL ) tablet 100 mg  100 mg Oral QHS Jadapalle, Sree, MD   100 mg at 05/15/24 2202   rosuvastatin  (CRESTOR ) tablet 20 mg  20 mg Oral Daily Jadapalle, Sree, MD   20 mg at 05/16/24 0930   sertraline  (ZOLOFT ) tablet 50 mg  50 mg Oral Daily Jadapalle, Sree, MD   50 mg at 05/16/24 0932   tamsulosin  (FLOMAX ) capsule 0.4 mg  0.4 mg Oral  Daily Jadapalle, Sree, MD   0.4 mg at 05/16/24 9066   PTA Medications: Medications Prior to Admission  Medication Sig Dispense Refill Last Dose/Taking   amLODipine  (NORVASC ) 10 MG tablet Take 10 mg by mouth daily.      [Paused] Baclofen  5 MG TABS Take 1 tablet by mouth in the morning, at noon, and at bedtime.      [Paused] diltiazem (CARDIZEM) 60 MG tablet Take 60 mg by mouth 3 (three) times daily. (Patient not taking: Reported on 05/01/2024)      docusate sodium  (COLACE) 100 MG capsule Take 1 capsule (100 mg total) by mouth 2 (two) times daily as needed for mild constipation.      folic acid  (FOLVITE ) 1 MG tablet Take 1 mg by mouth daily. (Patient not taking: Reported on 05/01/2024)      [Paused] furosemide (LASIX) 80 MG tablet Take 80 mg by mouth daily. (Patient not taking: Reported on 05/01/2024)      [Paused] gabapentin  (NEURONTIN ) 300 MG capsule Take 300 mg by  mouth 3 (three) times daily.      [Paused] hydrochlorothiazide (HYDRODIURIL) 12.5 MG tablet Take 12.5 mg by mouth daily as needed (swelling). (Patient not taking: Reported on 05/01/2024)      [Paused] hydrOXYzine (VISTARIL) 25 MG capsule Take 25 mg by mouth 2 (two) times daily. (Patient not taking: Reported on 05/01/2024)      [Paused] lactulose  (CHRONULAC ) 10 GM/15ML solution Take 20 g by mouth 2 (two) times daily. (Patient not taking: Reported on 05/01/2024)      [Paused] losartan  (COZAAR ) 25 MG tablet Take 12.5 mg by mouth every morning. (Patient not taking: Reported on 05/01/2024)      methocarbamol  (ROBAXIN ) 500 MG tablet Take 1 tablet (500 mg total) by mouth every 8 (eight) hours as needed for muscle spasms.      [Paused] mirtazapine  (REMERON ) 15 MG tablet Take 15 mg by mouth at bedtime. (Patient not taking: Reported on 05/01/2024)      Multiple Vitamin (MULTIVITAMIN WITH MINERALS) TABS tablet Take 1 tablet by mouth daily.      nystatin  cream (MYCOSTATIN ) Apply topically 2 (two) times daily as needed for dry skin. 30 g 0    oxyCODONE  (OXY IR/ROXICODONE ) 5 MG immediate release tablet Take 1-2 tablets (5-10 mg total) by mouth every 4 (four) hours as needed (5mg  for moderate pain, 10mg  for severe pain).      [Paused] pantoprazole  (PROTONIX ) 40 MG tablet Take 40 mg by mouth daily. (Patient not taking: Reported on 05/01/2024)      polyethylene glycol (MIRALAX  / GLYCOLAX ) 17 g packet Take 17 g by mouth daily as needed (constipation).      [Paused] QUEtiapine  (SEROQUEL ) 200 MG tablet Take 200 mg by mouth at bedtime. (Patient not taking: Reported on 05/01/2024)      [Paused] rosuvastatin  (CRESTOR ) 20 MG tablet Take 20 mg by mouth daily. (Patient not taking: Reported on 05/01/2024)      [Paused] spironolactone (ALDACTONE) 100 MG tablet Take 100 mg by mouth daily. (Patient not taking: Reported on 05/01/2024)      [Paused] tamsulosin  (FLOMAX ) 0.4 MG CAPS capsule Take 0.4 mg by mouth daily. (Patient not taking:  Reported on 05/01/2024)      thiamine  (VITAMIN B-1) 100 MG tablet Take 1 tablet (100 mg total) by mouth daily.      [Paused] traZODone (DESYREL) 50 MG tablet Take 50 mg by mouth at bedtime. (Patient not taking: Reported on 05/01/2024)       Patient  Stressors: Other: housing     Patient Strengths: Ability for insight  Active sense of humor  Capable of independent living  Communication skills   Treatment Modalities: Medication Management, Group therapy, Case management,  1 to 1 session with clinician, Psychoeducation, Recreational therapy.   Physician Treatment Plan for Primary Diagnosis: MDD (major depressive disorder), recurrent episode, severe (HCC) Long Term Goal(s): Improvement in symptoms so as ready for discharge   Short Term Goals: Ability to identify changes in lifestyle to reduce recurrence of condition will improve Ability to verbalize feelings will improve Ability to disclose and discuss suicidal ideas Ability to demonstrate self-control will improve Ability to identify and develop effective coping behaviors will improve  Medication Management: Evaluate patient's response, side effects, and tolerance of medication regimen.  Therapeutic Interventions: 1 to 1 sessions, Unit Group sessions and Medication administration.  Evaluation of Outcomes: Progressing  Physician Treatment Plan for Secondary Diagnosis: Principal Problem:   MDD (major depressive disorder), recurrent episode, severe (HCC) Active Problems:   Abdominal wall cellulitis   Open wnd lateral abdomen   Thrombocytopenia (HCC)  Long Term Goal(s): Improvement in symptoms so as ready for discharge   Short Term Goals: Ability to identify changes in lifestyle to reduce recurrence of condition will improve Ability to verbalize feelings will improve Ability to disclose and discuss suicidal ideas Ability to demonstrate self-control will improve Ability to identify and develop effective coping behaviors will improve      Medication Management: Evaluate patient's response, side effects, and tolerance of medication regimen.  Therapeutic Interventions: 1 to 1 sessions, Unit Group sessions and Medication administration.  Evaluation of Outcomes: Progressing   RN Treatment Plan for Primary Diagnosis: MDD (major depressive disorder), recurrent episode, severe (HCC) Long Term Goal(s): Knowledge of disease and therapeutic regimen to maintain health will improve  Short Term Goals: Ability to remain free from injury will improve, Ability to verbalize frustration and anger appropriately will improve, Ability to demonstrate self-control, Ability to participate in decision making will improve, Ability to verbalize feelings will improve, Ability to disclose and discuss suicidal ideas, Ability to identify and develop effective coping behaviors will improve, and Compliance with prescribed medications will improve  Medication Management: RN will administer medications as ordered by provider, will assess and evaluate patient's response and provide education to patient for prescribed medication. RN will report any adverse and/or side effects to prescribing provider.  Therapeutic Interventions: 1 on 1 counseling sessions, Psychoeducation, Medication administration, Evaluate responses to treatment, Monitor vital signs and CBGs as ordered, Perform/monitor CIWA, COWS, AIMS and Fall Risk screenings as ordered, Perform wound care treatments as ordered.  Evaluation of Outcomes: Progressing   LCSW Treatment Plan for Primary Diagnosis: MDD (major depressive disorder), recurrent episode, severe (HCC) Long Term Goal(s): Safe transition to appropriate next level of care at discharge, Engage patient in therapeutic group addressing interpersonal concerns.  Short Term Goals: Engage patient in aftercare planning with referrals and resources, Increase social support, Increase ability to appropriately verbalize feelings, Increase emotional  regulation, Facilitate acceptance of mental health diagnosis and concerns, Facilitate patient progression through stages of change regarding substance use diagnoses and concerns, Identify triggers associated with mental health/substance abuse issues, and Increase skills for wellness and recovery  Therapeutic Interventions: Assess for all discharge needs, 1 to 1 time with Social worker, Explore available resources and support systems, Assess for adequacy in community support network, Educate family and significant other(s) on suicide prevention, Complete Psychosocial Assessment, Interpersonal group therapy.  Evaluation of Outcomes: Progressing  Progress in Treatment: Attending groups: Yes. and No. Participating in groups: Yes. and No. Taking medication as prescribed: Yes. Toleration medication: Yes. Family/Significant other contact made: No, will contact:  CSW will contact if given permission Patient understands diagnosis: Yes. Discussing patient identified problems/goals with staff: Yes. Medical problems stabilized or resolved: Yes. Denies suicidal/homicidal ideation: Yes. Issues/concerns per patient self-inventory: No. Other: None    New problem(s) identified: No, Describe:  None identified  Update 05/16/24: No changes at this time   New Short Term/Long Term Goal(s): elimination of symptoms of psychosis, medication management for mood stabilization; elimination of SI thoughts; development of comprehensive mental wellness/sobriety plan. Update 05/16/24: No changes at this time   Patient Goals:  I want to get better Update 05/16/24: No changes at this time   Discharge Plan or Barriers: CSW will assist with appropriate discharge planning Update 05/16/24: No changes at this time   Reason for Continuation of Hospitalization: Depression Medication stabilization Suicidal ideation   Estimated Length of Stay: 1 to 7 days Update 05/16/24: TBD  Last 3 [place] Suicide Severity Risk  Score: Flowsheet Row Admission (Current) from 05/03/2024 in [facility]  C-SSRS RISK CATEGORY High Risk    Last PHQ 2/9 Scores:     No data to display          Scribe for Treatment Team: Lum JONETTA Croft, ISRA[place] 05/16/2024 4:05 PM

## 2024-05-16 NOTE — Group Note (Signed)
 Date:  05/16/2024 Time:  8:26 PM  Group Topic/Focus:  Healthy Communication:   The focus of this group is to discuss communication, barriers to communication, as well as healthy ways to communicate with others.    Participation Level:  Active  Participation Quality:  Appropriate  Affect:  Appropriate  Cognitive:  Appropriate  Insight: Good  Engagement in Group:  Engaged  Modes of Intervention:  Discussion  Additional Comments:    [name] 05/16/2024, 8:26 PM

## 2024-05-16 NOTE — Progress Notes (Signed)
   05/15/24 2100  Psych Admission Type (Psych Patients Only)  Admission Status Voluntary  Psychosocial Assessment  Patient Complaints Irritability  Eye Contact Fair  Facial Expression Worried  Affect Irritable  Speech Logical/coherent  Interaction Assertive  Motor Activity Slow  Appearance/Hygiene In scrubs  Behavior Characteristics Irritable  Mood Preoccupied  Thought Process  Coherency WDL  Content WDL  Delusions None reported or observed  Perception WDL  Hallucination None reported or observed  Judgment Impaired  Confusion WDL  Danger to Self  Current suicidal ideation? Denies

## 2024-05-16 NOTE — Group Note (Unsigned)
 Date:  05/16/2024 Time:  10:46 AM  Group Topic/Focus:  Making Healthy Choices:   The focus of this group is to help patients identify negative/unhealthy choices they were using prior to admission and identify positive/healthier coping strategies to replace them upon discharge.     Participation Level:  {BHH PARTICIPATION OZCZO:77735}  Participation Quality:  {BHH PARTICIPATION QUALITY:22265}  Affect:  {BHH AFFECT:22266}  Cognitive:  {BHH COGNITIVE:22267}  Insight: {BHH Insight2:20797}  Engagement in Group:  {BHH ENGAGEMENT IN HMNLE:77731}  Modes of Intervention:  {BHH MODES OF INTERVENTION:22269}  Additional Comments:  ***  Norleen SHAUNNA Bias 05/16/2024, 10:46 AM

## 2024-05-16 NOTE — Group Note (Signed)
 Date:  05/16/2024 Time:  8:22 PM  Group Topic/Focus:  Healthy Communication:   The focus of this group is to discuss communication, barriers to communication, as well as healthy ways to communicate with others.    Participation Level:  Active  Participation Quality:  Appropriate  Affect:  Appropriate  Cognitive:  Appropriate  Insight: Appropriate  Engagement in Group:  Engaged  Modes of Intervention:  Discussion  Additional Comments:    [name] 05/16/2024, 8:22 PM

## 2024-05-16 NOTE — Group Note (Signed)
 Recreation Therapy Group Note   Group Topic:Coping Skills  Group Date: 05/16/2024 Start Time: 1415 End Time: 1505 Facilitators: Celestia Jeoffrey BRAVO, LRT, CTRS Location: Dayroom  Group Description: Mind Map.  Patient was provided a blank template of a diagram with 32 blank boxes in a tiered system, branching from the center (similar to a bubble chart). LRT directed patients to label the middle of the diagram Coping Skills. LRT and patients then came up with 8 different coping skills as examples. Pt were directed to record their coping skills in the 2nd tier boxes closest to the center.  Patients would then share their coping skills with the group as LRT wrote them out. LRT gave a handout of 99 different coping skills at the end of group.   Goal Area(s) Addressed: Patients will be able to define "coping skills". Patient will identify new coping skills.  Patient will increase communication.  Affect/Mood: N/A   Participation Level: Did not attend    Clinical Observations/Individualized Feedback: Patient did not attend group.   Plan: Continue to engage patient in RT group sessions 2-3x/week.   Jeoffrey BRAVO Celestia, LRT, CTRS 05/16/2024 5:10 PM

## 2024-05-16 NOTE — Group Note (Signed)
 Date:  05/16/2024 Time:  8:27 PM  Group Topic/Focus:  Healthy Communication:   The focus of this group is to discuss communication, barriers to communication, as well as healthy ways to communicate with others.    Participation Level:  Active  Participation Quality:  Appropriate  Affect:  Appropriate  Cognitive:  Appropriate  Insight: Good  Engagement in Group:  Engaged  Modes of Intervention:  Discussion  Additional Comments:    Romero Earnie Hope 05/16/2024, 8:27 PM

## 2024-05-16 NOTE — Group Note (Signed)
 Date:  05/16/2024 Time:  10:45 AM  Group Topic/Focus:  Making Healthy Choices:   The focus of this group is to help patients identify negative/unhealthy choices they were using prior to admission and identify positive/healthier coping strategies to replace them upon discharge.    Participation Level:  Did Not Attend   Norleen SHAUNNA Bias 05/16/2024, 10:45 AM

## 2024-05-16 NOTE — Progress Notes (Signed)
 Patient is a voluntary admission to [facility] for MDD with SI attempt by stabbing himself in the stomach.  Patient has wound care from the surgery site dehisching.  The care involves aquacell and abd pad which was changed by this nurse after he had a shower.  Infectious Disease Abigail Chamber contacted unit manager and myself today.  Patient is allowed out of room when wound is covered and in this way can participate in meals and group.  Patient states he really doesn't want to do this because he doesn't want to be discharged from our facility.  Explained to patient that he still needs to participate.  Patient pleasant and cooperative and did come out for dinner today.  Will continue to monitor.

## 2024-05-16 NOTE — Group Note (Signed)
 Date:  05/16/2024 Time:  4:10 AM  Group Topic/Focus:  Wrap-Up Group:   The focus of this group is to help patients review their daily goal of treatment and discuss progress on daily workbooks.    Participation Level:  Did Not Attend   Additional Comments:    Kristen VEAR Gibbon 05/16/2024, 4:10 AM

## 2024-05-16 NOTE — Group Note (Signed)
 LCSW Group Therapy Note  Group Date: 05/16/2024 Start Time: 1330 End Time: 1400   Type of Therapy and Topic:  Group Therapy - Healthy vs Unhealthy Coping Skills  Participation Level:  Did Not Attend   Description of Group The focus of this group was to determine what unhealthy coping techniques typically are used by group members and what healthy coping techniques would be helpful in coping with various problems. Patients were guided in becoming aware of the differences between healthy and unhealthy coping techniques. Patients were asked to identify 2-3 healthy coping skills they would like to learn to use more effectively.  Therapeutic Goals Patients learned that coping is what human beings do all day long to deal with various situations in their lives Patients defined and discussed healthy vs unhealthy coping techniques Patients identified their preferred coping techniques and identified whether these were healthy or unhealthy Patients determined 2-3 healthy coping skills they would like to become more familiar with and use more often. Patients provided support and ideas to each other   Summary of Patient Progress:  X  Therapeutic Modalities Cognitive Behavioral Therapy Motivational Interviewing  Lum [name], LCSWA 05/16/2024  2:41 PM
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
## 2024-05-17 NOTE — Plan of Care (Signed)
  Problem: Education: Goal: Utilization of techniques to improve thought processes will improve Outcome: Progressing Goal: Knowledge of the prescribed therapeutic regimen will improve Outcome: Progressing   Problem: Coping: Goal: Coping ability will improve Outcome: Progressing Goal: Will verbalize feelings Outcome: Progressing   Problem: Health Behavior/Discharge Planning: Goal: Ability to make decisions will improve Outcome: Progressing Goal: Compliance with therapeutic regimen will improve Outcome: Progressing   Problem: Skin Integrity: Goal: Skin integrity will improve Outcome: Progressing

## 2024-05-17 NOTE — Progress Notes (Signed)
 Wound care performed after patients shower with aquacell and dsd applied after a repeat swab for mrsa obtained.  Swab sent to the lab with req.

## 2024-05-17 NOTE — Progress Notes (Signed)
 Estimated Sleeping Duration (Last 24 Hours): 6.00-7.25 hours   05/16/24 1922  Psych Admission Type (Psych Patients Only)  Admission Status Voluntary  Psychosocial Assessment  Patient Complaints Other (Comment) (C/o pain see MAR)  Eye Contact Fair  Facial Expression Animated  Affect Preoccupied  Speech Logical/coherent  Interaction Assertive  Motor Activity Slow  Appearance/Hygiene In scrubs  Behavior Characteristics Appropriate to situation  Mood Preoccupied  Aggressive Behavior  Effect No apparent injury  Thought Process  Coherency WDL  Content WDL  Delusions None reported or observed  Perception WDL  Hallucination None reported or observed  Judgment Impaired  Confusion WDL  Danger to Self  Current suicidal ideation? Denies  Agreement Not to Harm Self Yes  Description of Agreement Verbal  Danger to Others  Danger to Others None reported or observed

## 2024-05-17 NOTE — Group Note (Signed)
 Physical/Occupational Therapy Group Note  Group Topic: Neurographic Art  Group Date: 05/17/2024 Start Time: 1315 End Time: 1355 Facilitators: Clive Warren CROME, OT   Group Description: Group participated with Neurographic art activity, using watercolor paints to facilitate creative expression and meditation/relaxation for each individual.  Incorporated bimanual coordination, mental focus, emotional processing, task/command following and relaxation techniques as appropriate.  Patients engaged socially with therapist and other group participants throughout session. Allowed to ask questions as appropriate, and encouraged to identify ways they could use/share their creations with themselves and others.  Therapeutic Goal(s):  Demonstrate ability to independently manipulate utensils required to participate with and complete activity. Demonstrate ability to cognitively focus on task and follow commands necessary for completion. Demonstrate use of art as an outlet for emotional processing and expression. Identify and demonstrate importance of relaxation, neural calming and meditation for improved participation with life groups.  Individual Participation: Did not attend.   Participation Level: Did not attend   Participation Quality:   Behavior:   Speech/Thought Process:   Affect/Mood:   Insight:   Judgement:   Modes of Intervention:   Patient Response to Interventions:    Plan: Continue to engage patient in PT/OT groups 1 - 2x/week.  Rashmi Tallent R., MPH, MS, OTR/L ascom [phone] 05/17/24, 2:29 PM

## 2024-05-17 NOTE — Group Note (Signed)
 Recreation Therapy Group Note   Group Topic:General Recreation  Group Date: 05/17/2024 Start Time: 1400 End Time: 1425 Facilitators: Celestia Jeoffrey BRAVO, LRT, CTRS Location: Courtyard  Group Description: Outdoor Recreation. Patients had the option to play corn hole, ring toss, bowling or listening to music while outside in the courtyard getting fresh air and sunlight. Patients helped water and prune the raised garden beds. LRT and patients discussed things that they enjoy doing in their free time outside of the hospital. LRT encouraged patients to drink water after being active and getting their heart rate up.    Goal Area(s) Addressed:   Patient will identify leisure interests.    Patient will practice healthy decision making.   Patient will engage in recreation activity.    Affect/Mood: N/A   Participation Level: Did not attend    Clinical Observations/Individualized Feedback: Patient did not attend group.   Plan: Continue to engage patient in RT group sessions 2-3x/week.   Jeoffrey BRAVO Celestia, LRT, CTRS 05/17/2024 3:55 PM
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
## 2024-05-17 NOTE — Group Note (Signed)
 Date:  05/17/2024 Time:  11:32 AM  Group Topic/Focus:  Goals/Karaoke Group:   The focus of this group is to help patients establish daily goals to achieve during treatment and discuss how the patient can incorporate goal setting into their daily lives to aide in recovery. Also pts did sing along to some of their favorite songs while interacting with peers.     Participation Level:  Did Not Attend  Beatris ONEIDA Hasten 05/17/2024, 11:32 AM

## 2024-05-18 DIAGNOSIS — F333 Major depressive disorder, recurrent, severe with psychotic symptoms: Secondary | ICD-10-CM

## 2024-05-18 MED ORDER — QUETIAPINE FUMARATE 200 MG PO TABS
200.0000 mg | ORAL_TABLET | Freq: Every day | ORAL | Status: DC
  Administered 2024-05-18 – 2024-05-30 (×16): 200 mg via ORAL
  Filled 2024-05-18 (×13): qty 1

## 2024-05-18 MED ORDER — SERTRALINE HCL 50 MG PO TABS
100.0000 mg | ORAL_TABLET | Freq: Every day | ORAL | Status: DC
  Administered 2024-05-19 – 2024-05-31 (×16): 100 mg via ORAL
  Filled 2024-05-18 (×13): qty 2
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
## 2024-05-18 NOTE — Group Note (Signed)
 Date:  05/18/2024 Time:  10:56 PM  Group Topic/Focus:  Identifying Needs:   The focus of this group is to help patients identify their personal needs that have been historically problematic and identify healthy behaviors to address their needs.    Participation Level:  Minimal  Participation Quality:  Appropriate  Affect:  Appropriate  Cognitive:  Appropriate  Insight: None  Engagement in Group:  Limited  Modes of Intervention:  Orientation  Additional Comments:    [name] 05/18/2024, 10:56 PM

## 2024-05-18 NOTE — Progress Notes (Signed)
 Patient is pleasant and cooperative.  Requests medications throughout the shift.  Endorses anxiety regarding his bank issues and discharge.  Denies SI/HI and AVH.  Pain rated 9/10 in back.   Compliant with scheduled medications.  PRN pain mediations given x 2.  15 min checks in place for safety.  Patient is allowed to come to milieu - contact precautions only necessary when performing clean/ wound care.

## 2024-05-18 NOTE — Plan of Care (Signed)
   Problem: Education: Goal: Knowledge of the prescribed therapeutic regimen will improve Outcome: Progressing   Problem: Activity: Goal: Interest or engagement in leisure activities will improve Outcome: Progressing   Problem: Coping: Goal: Coping ability will improve Outcome: Progressing

## 2024-05-18 NOTE — Plan of Care (Signed)
  Problem: Education: Goal: Utilization of techniques to improve thought processes will improve Outcome: Progressing Goal: Knowledge of the prescribed therapeutic regimen will improve Outcome: Progressing   Problem: Activity: Goal: Interest or engagement in leisure activities will improve Outcome: Progressing Goal: Imbalance in normal sleep/wake cycle will improve Outcome: Progressing   Problem: Coping: Goal: Coping ability will improve Outcome: Progressing Goal: Will verbalize feelings Outcome: Progressing   Problem: Safety: Goal: Ability to disclose and discuss suicidal ideas will improve Outcome: Progressing Goal: Ability to identify and utilize support systems that promote safety will improve Outcome: Progressing

## 2024-05-18 NOTE — Group Note (Signed)
 Date:  05/18/2024 Time:  6:03 PM  Group Topic/Focus:  Early Warning Signs:   The focus of this group is to help patients identify signs or symptoms they exhibit before slipping into an unhealthy state or crisis.    Participation Level:  Active  Participation Quality:  Appropriate  Affect:  Appropriate  Cognitive:  Appropriate  Insight: Appropriate  Engagement in Group:  Engaged  Modes of Intervention:  Activity  Additional Comments:    Camellia HERO Amberli Ruegg 05/18/2024, 6:03 PM

## 2024-05-18 NOTE — Progress Notes (Signed)
 Estimated Sleeping Duration (Last 24 Hours): 6.00-6.50 hours Denies SI/HI/AVH. PRN given for pain.    05/17/24 2045  Psych Admission Type (Psych Patients Only)  Admission Status Voluntary  Psychosocial Assessment  Patient Complaints None  Eye Contact Fair  Facial Expression Animated  Affect Preoccupied  Speech Logical/coherent  Interaction Assertive  Motor Activity Slow  Appearance/Hygiene In scrubs  Behavior Characteristics Appropriate to situation  Mood Pleasant  Aggressive Behavior  Effect No apparent injury  Thought Process  Coherency WDL  Content WDL  Delusions None reported or observed  Perception WDL  Hallucination None reported or observed  Judgment Impaired  Confusion WDL  Danger to Self  Current suicidal ideation? Denies  Agreement Not to Harm Self Yes  Description of Agreement Verbal  Danger to Others  Danger to Others None reported or observed

## 2024-05-18 NOTE — Group Note (Signed)
 Date:  05/18/2024 Time:  6:35 AM  Group Topic/Focus:  Goals Group:   The focus of this group is to help patients establish daily goals to achieve during treatment and discuss how the patient can incorporate goal setting into their daily lives to aide in recovery. Relapse Prevention Planning:   The focus of this group is to define relapse and discuss the need for planning to combat relapse. Wrap-Up Group:   The focus of this group is to help patients review their daily goal of treatment and discuss progress on daily workbooks.    Participation Level:  Did Not Attend   Additional Comments:    Kristen VEAR Gibbon 05/18/2024, 6:35 AM

## 2024-05-18 NOTE — Group Note (Signed)
 Date:  05/18/2024 Time:  4:04 PM  Group Topic/Focus:  Crisis Planning:   The purpose of this group is to help patients create a crisis plan for use upon discharge or in the future, as needed.    Participation Level:  Active  Participation Quality:  Appropriate  Affect:  Appropriate  Cognitive:  Appropriate  Insight: Appropriate  Engagement in Group:  Engaged  Modes of Intervention:  Activity  Additional Comments:    [name] 05/18/2024, 4:04 PM

## 2024-05-19 NOTE — Group Note (Signed)
 [facility] LCSW Group Therapy Note   Group Date: 05/19/2024 Start Time: 1300 End Time: 1400   Type of Therapy/Topic:  Group Therapy:  Emotion Regulation  Participation Level:  Did Not Attend   Mood:  Description of Group:    The purpose of this group is to assist patients in learning to regulate negative emotions and experience positive emotions. Patients will be guided to discuss ways in which they have been vulnerable to their negative emotions. These vulnerabilities will be juxtaposed with experiences of positive emotions or situations, and patients challenged to use positive emotions to combat negative ones. Special emphasis will be placed on coping with negative emotions in conflict situations, and patients will process healthy conflict resolution skills.  Therapeutic Goals: Patient will identify two positive emotions or experiences to reflect on in order to balance out negative emotions:  Patient will label two or more emotions that they find the most difficult to experience:  Patient will be able to demonstrate positive conflict resolution skills through discussion or role plays:   Summary of Patient Progress:   X    Therapeutic Modalities:   Cognitive Behavioral Therapy Feelings Identification Dialectical Behavioral Therapy   Pamila Nine, LCSW

## 2024-05-19 NOTE — Progress Notes (Signed)
 Patient is pleasant and cooperative.  Endorses anxiety.  Denies SI/HI and AVH.  Pain rated 10/10 in back.   Compliant with scheduled medications.  PRN medication given for pain.  15 min checks in place or safety.  Patient isolates to room with the exception of meals.  Appropriate interaction with peers and staff while in the milieu.   Wound cleaned with saline and dressed with Aquacel, gauze and abdominal pad.

## 2024-05-19 NOTE — Plan of Care (Signed)
   Problem: Education: Goal: Utilization of techniques to improve thought processes will improve Outcome: Progressing Goal: Knowledge of the prescribed therapeutic regimen will improve Outcome: Progressing

## 2024-05-19 NOTE — Plan of Care (Signed)
   Problem: Education: Goal: Knowledge of the prescribed therapeutic regimen will improve Outcome: Progressing   Problem: Activity: Goal: Interest or engagement in leisure activities will improve Outcome: Progressing   Problem: Coping: Goal: Coping ability will improve Outcome: Progressing

## 2024-05-19 NOTE — Group Note (Signed)
 Date:  05/19/2024 Time:  10:37 AM  Group Topic/Focus:  Overcoming Stress:   The focus of this group is to define stress and help patients assess their triggers.    Participation Level:  Did Not Attend   Arland Nutting 05/19/2024, 10:37 AM

## 2024-05-20 NOTE — Group Note (Signed)
 Date:  05/20/2024 Time:  2:24 PM  Group Topic/Focus:  Making Healthy Choices:   The focus of this group is to help patients identify negative/unhealthy choices they were using prior to admission and identify positive/healthier coping strategies to replace them upon discharge.    Participation Level:  Minimal  Participation Quality:  Appropriate  Affect:  Appropriate  Cognitive:  Appropriate  Insight: Appropriate  Engagement in Group:  Engaged  Modes of Intervention:  Activity  Additional Comments:    Radley Teston 05/20/2024, 2:24 PM

## 2024-05-20 NOTE — Group Note (Signed)
 Date:  05/20/2024 Time:  10:42 PM  Group Topic/Focus:  Self Care:   The focus of this group is to help patients understand the importance of self-care in order to improve or restore emotional, physical, spiritual, interpersonal, and financial health.    Participation Level:  Active  Participation Quality:  Appropriate  Affect:  Appropriate  Cognitive:  Appropriate  Insight: Appropriate  Engagement in Group:  Engaged  Modes of Intervention:  Discussion  Additional Comments:    Laymon ONEIDA Finder 05/20/2024, 10:42 PM
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
## 2024-05-20 NOTE — Progress Notes (Signed)
   05/20/24 1137  Psych Admission Type (Psych Patients Only)  Admission Status Voluntary  Psychosocial Assessment  Patient Complaints None  Eye Contact Fair  Facial Expression Animated  Affect Anxious  Speech Logical/coherent  Interaction Assertive  Motor Activity Slow  Appearance/Hygiene In scrubs  Behavior Characteristics Cooperative  Mood Pleasant  Thought Process  Coherency WDL  Content WDL  Delusions None reported or observed  Perception WDL  Hallucination None reported or observed  Judgment Impaired  Confusion None  Danger to Self  Current suicidal ideation? Denies  Danger to Others  Danger to Others None reported or observed

## 2024-05-20 NOTE — Group Note (Signed)
 Recreation Therapy Group Note   Group Topic:Leisure Education  Group Date: 05/20/2024 Start Time: 1500 End Time: 1600 Facilitators: Celestia Jeoffrey BRAVO, LRT, CTRS Location: Courtyard  Group Description: Music. Patients encouraged to name their favorite song(s) for LRT to play song through speaker for group to hear, while in the courtyard getting fresh air and sunlight. Patients educated on the definition of leisure and the importance of having different leisure interests outside of the hospital. Group discussed how leisure activities can often be used as Pharmacologist and that listening to music and being outside are examples.    Goal Area(s) Addressed:  Patient will identify a current leisure interest.  Patient will practice making a positive decision. Patient will have the opportunity to try a new leisure activity.   Affect/Mood: N/A   Participation Level: Did not attend    Clinical Observations/Individualized Feedback: Patient did not attend group.   Plan: Continue to engage patient in RT group sessions 2-3x/week.   Jeoffrey BRAVO Celestia, LRT, CTRS 05/20/2024 5:14 PM

## 2024-05-20 NOTE — Plan of Care (Signed)
  Problem: Education: Goal: Knowledge of the prescribed therapeutic regimen will improve Outcome: Not Progressing   Problem: Self-Concept: Goal: Level of anxiety will decrease Outcome: Not Progressing

## 2024-05-21 NOTE — Progress Notes (Signed)
   05/21/24 1100  Psych Admission Type (Psych Patients Only)  Admission Status Voluntary  Psychosocial Assessment  Patient Complaints None  Eye Contact Fair  Facial Expression Animated  Affect Anxious  Speech Logical/coherent  Interaction Assertive  Motor Activity Slow  Appearance/Hygiene In scrubs  Behavior Characteristics Cooperative  Mood Pleasant  Thought Process  Coherency WDL  Content WDL  Delusions None reported or observed  Perception WDL  Hallucination None reported or observed  Judgment Limited  Confusion None  Danger to Self  Current suicidal ideation? Denies  Agreement Not to Harm Self Yes  Description of Agreement verbal  Danger to Others  Danger to Others None reported or observed

## 2024-05-21 NOTE — Plan of Care (Signed)
   Problem: Coping: Goal: Coping ability will improve Outcome: Progressing Goal: Will verbalize feelings Outcome: Progressing

## 2024-05-21 NOTE — Group Note (Signed)
 Recreation Therapy Group Note   Group Topic:Stress Management  Group Date: 05/21/2024 Start Time: 1500 End Time: 1540 Facilitators: Celestia Jeoffrey BRAVO, LRT, CTRS Location: Dayroom  Group Description: Meditation. LRT and patients discussed what they know about meditation and mindfulness. LRT played a Deep Breathing Meditation exercise script for patients to follow along to. LRT and patients discussed how meditation and deep breathing can be used as a coping skill post--discharge to help manage symptoms of stress.    Goal Area(s) Addressed: Patient will practice using relaxation technique. Patient will identify a new coping skill.  Patient will follow multistep directions to reduce anxiety and stress.   Affect/Mood: N/A   Participation Level: Did not attend    Clinical Observations/Individualized Feedback: Patient did not attend group.   Plan: Continue to engage patient in RT group sessions 2-3x/week.   Jeoffrey BRAVO Celestia, LRT, CTRS 05/21/2024 4:11 PM

## 2024-05-21 NOTE — Progress Notes (Signed)
   05/20/24 2300  Psych Admission Type (Psych Patients Only)  Admission Status Voluntary  Psychosocial Assessment  Patient Complaints Sleep disturbance  Eye Contact Fair  Facial Expression Animated  Affect Anxious  Speech Logical/coherent  Interaction Assertive  Motor Activity Restless  Appearance/Hygiene Unremarkable;In scrubs  Behavior Characteristics Cooperative  Mood Labile  Thought Process  Coherency WDL  Content WDL  Delusions None reported or observed  Perception WDL  Hallucination None reported or observed  Judgment Limited  Confusion None  Danger to Self  Current suicidal ideation? Denies  Self-Injurious Behavior No self-injurious ideation or behavior indicators observed or expressed   Agreement Not to Harm Self Yes  Description of Agreement Verbal  Danger to Others  Danger to Others None reported or observed   Abd. Dressing changed. Wound with noted light greenish exudate, draining a small amount.
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
## 2024-05-21 NOTE — Group Note (Signed)

## 2024-05-21 NOTE — BHH Counselor (Signed)
 CSW contacted [facility] [phone]) , to check on bed availability. CSW sent to program director, Andrew's VM, CSW left HIPAA compliant VM requesting return call.   Lum Croft, MSW, [place] 05/21/2024 2:20 PM

## 2024-05-21 NOTE — Group Note (Signed)
 Date:  05/21/2024 Time:  10:51 AM  Group Topic/Focus:  Goals Group:   The focus of this group is to help patients establish daily goals to achieve during treatment and discuss how the patient can incorporate goal setting into their daily lives to aide in recovery.    Participation Level:  Did Not Attend  [name] 05/21/2024, 10:51 AM

## 2024-05-21 NOTE — BHH Counselor (Signed)
 CSW contacted [employer].   CSW unable to LVM as VM box is full.   Lum Croft, MSW, [place] 05/21/2024 2:17 PM

## 2024-05-21 NOTE — Plan of Care (Signed)
   Problem: Coping: Goal: Coping ability will improve Outcome: Not Progressing Goal: Will verbalize feelings Outcome: Not Progressing

## 2024-05-21 NOTE — BHH Counselor (Signed)
 CSW contacted Medicaid of [place] [phone]) to inquire about case management services.   CSW unable to reach, unable to LVM requesting return call.   Lum Croft, MSW, [place] 05/21/2024 2:29 PM

## 2024-05-21 NOTE — Group Note (Signed)
 Recreation Therapy Group Note   Group Topic:Other  Group Date: 05/21/2024 Start Time: 1100 End Time: 1120 Facilitators: Celestia Jeoffrey BRAVO, LRT, CTRS Location: Dayroom  Activity Description/Intervention: Therapeutic Drumming. Patients with peers and staff were given the opportunity to engage in a leader facilitated [facility] Empowerment Drumming Circle with staff from the [employer], in partnership with [employer]. Teaching laboratory technician and trained [employer], Norleen Mon leading with LRT observing and documenting intervention and pt response. This evidenced-based practice targets 7 areas of health and wellbeing in the human experience including: stress-reduction, exercise, self-expression, camaraderie/support, nurturing, spirituality, and music-making (leisure).    Goal Area(s) Addresses:  Patient will engage in pro-social way in music group.  Patient will follow directions of drum leader on the first prompt. Patient will demonstrate no behavioral issues during group.  Patient will identify if a reduction in stress level occurs as a result of participation in therapeutic drum circle.     Affect/Mood: N/A   Participation Level: Did not attend    Clinical Observations/Individualized Feedback: Patient did not attend group.   Plan: Continue to engage patient in RT group sessions 2-3x/week.   Jeoffrey BRAVO Celestia, LRT, CTRS 05/21/2024 2:00 PM

## 2024-05-21 NOTE — BH IP Treatment Plan (Signed)
 Interdisciplinary Treatment and Diagnostic Plan Update  05/21/2024 Time of Session: 2:30 PM Sharad Vaneaton MRN: [id number]  Principal Diagnosis: MDD (major depressive disorder), recurrent episode, severe (HCC)  Secondary Diagnoses: Principal Problem:   MDD (major depressive disorder), recurrent episode, severe (HCC) Active Problems:   Abdominal wall cellulitis   Open wnd lateral abdomen   Thrombocytopenia (HCC)   Current Medications:  Current Facility-Administered Medications  Medication Dose Route Frequency Provider Last Rate Last Admin   acetaminophen  (TYLENOL ) tablet 650 mg  650 mg Oral Q6H PRN Starkes-Perry, Takia S, FNP   650 mg at 05/12/24 1447   alum & mag hydroxide-simeth (MAALOX/MYLANTA) 200-200-20 MG/5ML suspension 30 mL  30 mL Oral Q4H PRN Wilkie Majel RAMAN, FNP       amLODipine  (NORVASC ) tablet 10 mg  10 mg Oral Daily Wilkie Majel RAMAN, FNP   10 mg at 05/21/24 0932   artificial tears ophthalmic solution 1 drop  1 drop Both Eyes PRN Jadapalle, Sree, MD   1 drop at 05/20/24 2134   baclofen  (LIORESAL ) tablet 5 mg  5 mg Oral TID Jadapalle, Sree, MD   5 mg at 05/21/24 0932   docusate sodium  (COLACE) capsule 100 mg  100 mg Oral BID Starkes-Perry, Takia S, FNP   100 mg at 05/21/24 0933   Fe Fum-Vit C-Vit B12-FA (TRIGELS-F FORTE) capsule 1 capsule  1 capsule Oral BID Amin, Sumayya, MD   1 capsule at 05/21/24 9066   gabapentin  (NEURONTIN ) capsule 300 mg  300 mg Oral TID Jadapalle, Sree, MD   300 mg at 05/21/24 0932   lactulose  (CHRONULAC ) 10 GM/15ML solution 20 g  20 g Oral BID Amin, Sumayya, MD   20 g at 05/20/24 9072   melatonin tablet 5 mg  5 mg Oral QHS PRN Wilkie Majel RAMAN, FNP   5 mg at 05/12/24 2104   mirtazapine  (REMERON ) tablet 15 mg  15 mg Oral QHS Starkes-Perry, Takia S, FNP   15 mg at 05/20/24 2117   multivitamin with minerals tablet 1 tablet  1 tablet Oral Daily Starkes-Perry, Takia S, FNP   1 tablet at 05/21/24 0932   nystatin  cream (MYCOSTATIN )   Topical  BID Wilkie Majel RAMAN, FNP   Given at 05/21/24 9066   OLANZapine  (ZYPREXA ) injection 5 mg  5 mg Intramuscular TID PRN Wilkie Majel RAMAN, FNP       OLANZapine  zydis (ZYPREXA ) disintegrating tablet 5 mg  5 mg Oral TID PRN Wilkie Majel RAMAN, FNP       oxyCODONE  (Oxy IR/ROXICODONE ) immediate release tablet 15 mg  15 mg Oral Q6H PRN Amin, Sumayya, MD   15 mg at 05/21/24 1301   pantoprazole  (PROTONIX ) EC tablet 40 mg  40 mg Oral Daily Jadapalle, Sree, MD   40 mg at 05/21/24 0932   polyethylene glycol (MIRALAX  / GLYCOLAX ) packet 17 g  17 g Oral Daily PRN Wilkie Majel RAMAN, FNP       QUEtiapine  (SEROQUEL ) tablet 200 mg  200 mg Oral QHS Shrivastava, Aryendra, MD   200 mg at 05/20/24 2117   rosuvastatin  (CRESTOR ) tablet 20 mg  20 mg Oral Daily Jadapalle, Sree, MD   20 mg at 05/21/24 0932   sertraline  (ZOLOFT ) tablet 100 mg  100 mg Oral Daily Shrivastava, Aryendra, MD   100 mg at 05/21/24 9066   tamsulosin  (FLOMAX ) capsule 0.4 mg  0.4 mg Oral Daily Jadapalle, Sree, MD   0.4 mg at 05/21/24 0932   PTA Medications: Medications Prior to Admission  Medication Sig Dispense  Refill Last Dose/Taking   amLODipine  (NORVASC ) 10 MG tablet Take 10 mg by mouth daily.      [Paused] Baclofen  5 MG TABS Take 1 tablet by mouth in the morning, at noon, and at bedtime.      [Paused] diltiazem (CARDIZEM) 60 MG tablet Take 60 mg by mouth 3 (three) times daily. (Patient not taking: Reported on 05/01/2024)      docusate sodium  (COLACE) 100 MG capsule Take 1 capsule (100 mg total) by mouth 2 (two) times daily as needed for mild constipation.      folic acid  (FOLVITE ) 1 MG tablet Take 1 mg by mouth daily. (Patient not taking: Reported on 05/01/2024)      [Paused] furosemide (LASIX) 80 MG tablet Take 80 mg by mouth daily. (Patient not taking: Reported on 05/01/2024)      [Paused] gabapentin  (NEURONTIN ) 300 MG capsule Take 300 mg by mouth 3 (three) times daily.      [Paused] hydrochlorothiazide (HYDRODIURIL) 12.5 MG  tablet Take 12.5 mg by mouth daily as needed (swelling). (Patient not taking: Reported on 05/01/2024)      [Paused] hydrOXYzine (VISTARIL) 25 MG capsule Take 25 mg by mouth 2 (two) times daily. (Patient not taking: Reported on 05/01/2024)      [Paused] lactulose  (CHRONULAC ) 10 GM/15ML solution Take 20 g by mouth 2 (two) times daily. (Patient not taking: Reported on 05/01/2024)      [Paused] losartan  (COZAAR ) 25 MG tablet Take 12.5 mg by mouth every morning. (Patient not taking: Reported on 05/01/2024)      methocarbamol  (ROBAXIN ) 500 MG tablet Take 1 tablet (500 mg total) by mouth every 8 (eight) hours as needed for muscle spasms.      [Paused] mirtazapine  (REMERON ) 15 MG tablet Take 15 mg by mouth at bedtime. (Patient not taking: Reported on 05/01/2024)      Multiple Vitamin (MULTIVITAMIN WITH MINERALS) TABS tablet Take 1 tablet by mouth daily.      nystatin  cream (MYCOSTATIN ) Apply topically 2 (two) times daily as needed for dry skin. 30 g 0    oxyCODONE  (OXY IR/ROXICODONE ) 5 MG immediate release tablet Take 1-2 tablets (5-10 mg total) by mouth every 4 (four) hours as needed (5mg  for moderate pain, 10mg  for severe pain).      [Paused] pantoprazole  (PROTONIX ) 40 MG tablet Take 40 mg by mouth daily. (Patient not taking: Reported on 05/01/2024)      polyethylene glycol (MIRALAX  / GLYCOLAX ) 17 g packet Take 17 g by mouth daily as needed (constipation).      [Paused] QUEtiapine  (SEROQUEL ) 200 MG tablet Take 200 mg by mouth at bedtime. (Patient not taking: Reported on 05/01/2024)      [Paused] rosuvastatin  (CRESTOR ) 20 MG tablet Take 20 mg by mouth daily. (Patient not taking: Reported on 05/01/2024)      [Paused] spironolactone (ALDACTONE) 100 MG tablet Take 100 mg by mouth daily. (Patient not taking: Reported on 05/01/2024)      [Paused] tamsulosin  (FLOMAX ) 0.4 MG CAPS capsule Take 0.4 mg by mouth daily. (Patient not taking: Reported on 05/01/2024)      thiamine  (VITAMIN B-1) 100 MG tablet Take 1 tablet (100 mg  total) by mouth daily.      [Paused] traZODone (DESYREL) 50 MG tablet Take 50 mg by mouth at bedtime. (Patient not taking: Reported on 05/01/2024)       Patient Stressors: Other: housing     Patient Strengths: Ability for insight  Active sense of humor  Capable of independent living  Communication skills   Treatment Modalities: Medication Management, Group therapy, Case management,  1 to 1 session with clinician, Psychoeducation, Recreational therapy.   Physician Treatment Plan for Primary Diagnosis: MDD (major depressive disorder), recurrent episode, severe (HCC) Long Term Goal(s): Improvement in symptoms so as ready for discharge   Short Term Goals: Ability to identify changes in lifestyle to reduce recurrence of condition will improve Ability to verbalize feelings will improve Ability to disclose and discuss suicidal ideas Ability to demonstrate self-control will improve Ability to identify and develop effective coping behaviors will improve  Medication Management: Evaluate patient's response, side effects, and tolerance of medication regimen.  Therapeutic Interventions: 1 to 1 sessions, Unit Group sessions and Medication administration.  Evaluation of Outcomes: Progressing  Physician Treatment Plan for Secondary Diagnosis: Principal Problem:   MDD (major depressive disorder), recurrent episode, severe (HCC) Active Problems:   Abdominal wall cellulitis   Open wnd lateral abdomen   Thrombocytopenia (HCC)  Long Term Goal(s): Improvement in symptoms so as ready for discharge   Short Term Goals: Ability to identify changes in lifestyle to reduce recurrence of condition will improve Ability to verbalize feelings will improve Ability to disclose and discuss suicidal ideas Ability to demonstrate self-control will improve Ability to identify and develop effective coping behaviors will improve     Medication Management: Evaluate patient's response, side effects, and tolerance of  medication regimen.  Therapeutic Interventions: 1 to 1 sessions, Unit Group sessions and Medication administration.  Evaluation of Outcomes: Progressing   RN Treatment Plan for Primary Diagnosis: MDD (major depressive disorder), recurrent episode, severe (HCC) Long Term Goal(s): Knowledge of disease and therapeutic regimen to maintain health will improve  Short Term Goals: Ability to remain free from injury will improve, Ability to verbalize frustration and anger appropriately will improve, Ability to demonstrate self-control, Ability to participate in decision making will improve, Ability to verbalize feelings will improve, Ability to disclose and discuss suicidal ideas, Ability to identify and develop effective coping behaviors will improve, and Compliance with prescribed medications will improve  Medication Management: RN will administer medications as ordered by provider, will assess and evaluate patient's response and provide education to patient for prescribed medication. RN will report any adverse and/or side effects to prescribing provider.  Therapeutic Interventions: 1 on 1 counseling sessions, Psychoeducation, Medication administration, Evaluate responses to treatment, Monitor vital signs and CBGs as ordered, Perform/monitor CIWA, COWS, AIMS and Fall Risk screenings as ordered, Perform wound care treatments as ordered.  Evaluation of Outcomes: Progressing   LCSW Treatment Plan for Primary Diagnosis: MDD (major depressive disorder), recurrent episode, severe (HCC) Long Term Goal(s): Safe transition to appropriate next level of care at discharge, Engage patient in therapeutic group addressing interpersonal concerns.  Short Term Goals: Engage patient in aftercare planning with referrals and resources, Increase social support, Increase ability to appropriately verbalize feelings, Increase emotional regulation, Facilitate acceptance of mental health diagnosis and concerns, Facilitate  patient progression through stages of change regarding substance use diagnoses and concerns, Identify triggers associated with mental health/substance abuse issues, and Increase skills for wellness and recovery  Therapeutic Interventions: Assess for all discharge needs, 1 to 1 time with Social worker, Explore available resources and support systems, Assess for adequacy in community support network, Educate family and significant other(s) on suicide prevention, Complete Psychosocial Assessment, Interpersonal group therapy.  Evaluation of Outcomes: Progressing   Progress in Treatment: Attending groups: Yes. and No. Participating in groups: Yes. and No. Taking medication as prescribed: Yes. Toleration medication: Yes. Family/Significant  other contact made: No, will contact:  CSW will contact if given permission Patient understands diagnosis: Yes. Discussing patient identified problems/goals with staff: Yes. Medical problems stabilized or resolved: Yes. Denies suicidal/homicidal ideation: Yes. Issues/concerns per patient self-inventory: No. Other: None    New problem(s) identified: No, Describe:  None identified  Update 05/16/24: No changes at this time Update 05/21/24: No changes at this time   New Short Term/Long Term Goal(s): elimination of symptoms of psychosis, medication management for mood stabilization; elimination of SI thoughts; development of comprehensive mental wellness/sobriety plan. Update 05/16/24: No changes at this time Update 05/21/24: No changes at this time   Patient Goals:  I want to get better Update 05/16/24: No changes at this time   Discharge Plan or Barriers: CSW will assist with appropriate discharge planning Update 05/16/24: No changes at this time Update 05/21/24: No changes at this time   Reason for Continuation of Hospitalization: Depression Medication stabilization Suicidal ideation   Estimated Length of Stay: 1 to 7 days Update 05/16/24: TBD Update 05/21/24: TBD    Last 3 [place] Suicide Severity Risk Score: Flowsheet Row Admission (Current) from 05/03/2024 in [facility]  C-SSRS RISK CATEGORY High Risk    Last PHQ 2/9 Scores:     No data to display          Scribe for Treatment Team: Lum JONETTA Croft, ISRA[place] 05/21/2024 4:01 PM

## 2024-05-22 NOTE — B[facility] Counselor (Incomplete)
 CSW received return call from [facility]

## 2024-05-22 NOTE — Plan of Care (Signed)
 ?  Problem: Education: ?Goal: Knowledge of the prescribed therapeutic regimen will improve ?Outcome: Progressing ?  ?Problem: Coping: ?Goal: Coping ability will improve ?Outcome: Progressing ?Goal: Will verbalize feelings ?Outcome: Progressing ?  ?

## 2024-05-22 NOTE — Progress Notes (Signed)
 Patient refused bandage change during this shift.

## 2024-05-22 NOTE — Group Note (Signed)
 Date:  05/22/2024 Time:  4:05 PM  Group Topic/Focus:  Self Care:   The focus of this group is to help patients understand the importance of self-care in order to improve or restore emotional, physical, spiritual, interpersonal, and financial health.    Participation Level:  Did Not Attend   [name] 05/22/2024, 4:05 PM

## 2024-05-22 NOTE — Progress Notes (Signed)
   05/21/24 2000  Psych Admission Type (Psych Patients Only)  Admission Status Voluntary  Psychosocial Assessment  Patient Complaints None  Eye Contact Fair  Facial Expression Animated  Affect Appropriate to circumstance  Speech Logical/coherent  Interaction Assertive  Motor Activity Slow  Appearance/Hygiene In scrubs  Behavior Characteristics Cooperative  Mood Pleasant  Thought Process  Coherency WDL  Content WDL  Delusions None reported or observed  Perception WDL  Hallucination None reported or observed  Judgment Limited  Confusion None  Danger to Self  Current suicidal ideation? Denies  Self-Injurious Behavior No self-injurious ideation or behavior indicators observed or expressed   Agreement Not to Harm Self Yes  Description of Agreement verbal  Danger to Others  Danger to Others None reported or observed

## 2024-05-22 NOTE — Group Note (Signed)
 Physical/Occupational Therapy Group Note  Group Topic: Yoga  Group Date: 05/22/2024 Start Time: 1300 End Time: 1330 Facilitators: Jeury Mcnab, Alm Hamilton, PT   Group Description: Group participated with series of yoga poses, designed to emphasize functional sitting balance, core stability, generalized flexibility and overall posture.  Incorporated deep breathing techniques with poses, working to promote relaxation, mindfulness and focus with targeted activities.   Discussed benefits of yoga in improving mood and self-esteem, reducing stress and anxiety, and promoting functional strength and balance for each participant.  Discussed ways to integrate into each participant's daily routine.  Provided handout with written and pictorial descriptions of included yoga movements to be utilized as appropriate outside of group time.  Therapeutic Goal(s):  Demonstrate safe ability to participate with yoga poses during group activity. Identify one benefit of participation with yoga poses as part of each participant's exercise/movement routine. Identify 1-2 individual poses that participant feels most beneficial to his/her needs and that he/she can easily replicate outside of group.  Individual Participation: Pt actively participated with the discussion and physical activity components of the session.  Participation Level: Active   Participation Quality: Minimal Cues   Behavior: Appropriate   Speech/Thought Process: Coherent and Organized   Affect/Mood: Appropriate   Insight: Good   Judgement: Good   Modes of Intervention: Activity, Discussion, and Education  Patient Response to Interventions:  Attentive and Engaged   Plan: Continue to engage patient in PT/OT groups 1 - 2x/week.  CHARM Hamilton Bertin PT, DPT 05/22/24, 1:52 PM

## 2024-05-22 NOTE — Progress Notes (Addendum)
 Patient approached requesting dressing change after refusing earlier in the shift. Dressing changed. No signs of redness or purulent drainage. Patient remains safe at this time.

## 2024-05-22 NOTE — Progress Notes (Signed)
   05/22/24 0913  Psych Admission Type (Psych Patients Only)  Admission Status Voluntary  Psychosocial Assessment  Patient Complaints Worrying  Eye Contact Fair  Facial Expression Animated  Affect Irritable  Speech Logical/coherent  Interaction Assertive  Motor Activity Slow  Appearance/Hygiene In scrubs  Behavior Characteristics Cooperative;Irritable  Mood Preoccupied  Thought Process  Coherency WDL  Content WDL  Delusions None reported or observed  Perception WDL  Hallucination None reported or observed  Judgment Limited  Confusion None  Danger to Self  Current suicidal ideation? Denies  Agreement Not to Harm Self Yes  Description of Agreement Verbal  Danger to Others  Danger to Others None reported or observed
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
## 2024-05-22 NOTE — Plan of Care (Signed)
  Problem: Education: Goal: Utilization of techniques to improve thought processes will improve Outcome: Progressing Goal: Knowledge of the prescribed therapeutic regimen will improve Outcome: Progressing   Problem: Activity: Goal: Interest or engagement in leisure activities will improve Outcome: Progressing Goal: Imbalance in normal sleep/wake cycle will improve Outcome: Progressing   Problem: Coping: Goal: Coping ability will improve Outcome: Progressing Goal: Will verbalize feelings Outcome: Progressing   Problem: Health Behavior/Discharge Planning: Goal: Ability to make decisions will improve Outcome: Progressing Goal: Compliance with therapeutic regimen will improve Outcome: Progressing   Problem: Role Relationship: Goal: Will demonstrate positive changes in social behaviors and relationships Outcome: Progressing   Problem: Safety: Goal: Ability to disclose and discuss suicidal ideas will improve Outcome: Progressing Goal: Ability to identify and utilize support systems that promote safety will improve Outcome: Progressing   Problem: Self-Concept: Goal: Will verbalize positive feelings about self Outcome: Progressing Goal: Level of anxiety will decrease Outcome: Progressing   Problem: Clinical Measurements: Goal: Ability to avoid or minimize complications of infection will improve Outcome: Progressing   Problem: Skin Integrity: Goal: Skin integrity will improve Outcome: Progressing

## 2024-05-23 NOTE — Progress Notes (Signed)
   05/22/24 2100  Psych Admission Type (Psych Patients Only)  Admission Status Voluntary  Psychosocial Assessment  Patient Complaints None  Eye Contact Fair  Facial Expression Animated  Affect Irritable  Speech Logical/coherent  Interaction Assertive  Motor Activity Slow  Appearance/Hygiene In scrubs  Behavior Characteristics Irritable  Mood Preoccupied  Thought Process  Coherency WDL  Content WDL  Delusions None reported or observed  Perception WDL  Hallucination None reported or observed  Judgment Limited  Confusion None  Danger to Self  Current suicidal ideation? Denies  Self-Injurious Behavior No self-injurious ideation or behavior indicators observed or expressed   Agreement Not to Harm Self Yes  Description of Agreement verbal

## 2024-05-23 NOTE — Group Note (Signed)
 Date:  05/23/2024 Time:  11:30 AM  Group Topic/Focus:  Dimensions of Wellness:   The focus of this group is to introduce the topic of wellness and discuss the role each dimension of wellness plays in total health.    Participation Level:  Active  Participation Quality:  Appropriate  Affect:  Appropriate  Cognitive:  Appropriate  Insight: Good  Engagement in Group:  Engaged  Modes of Intervention:  Activity  Additional Comments:  N/A  [name] Gavel 05/23/2024, 11:30 AM

## 2024-05-23 NOTE — Group Note (Signed)
 Recreation Therapy Group Note   Group Topic:Health and Wellness  Group Date: 05/23/2024 Start Time: 1100 End Time: 1135 Facilitators: Celestia Jeoffrey BRAVO, LRT, CTRS Location: Dayroom  Group Description: Seated Exercise. LRT discussed the mental and physical benefits of exercise. LRT and group discussed how physical activity can be used as a coping skill. Pt's and LRT followed along to an exercise video on the TV screen that provided a visual representation and audio description of every exercise performed. Pt's encouraged to listen to their bodies and stop at any time if they experience feelings of discomfort or pain. Pts were encouraged to drink water and stay hydrated.   Goal Area(s) Addressed: Patient will learn benefits of physical activity. Patient will identify exercise as a coping skill.  Patient will follow multistep directions. Patient will try a new leisure interest.    Affect/Mood: Appropriate   Participation Level: Active and Engaged   Participation Quality: Independent   Behavior: Calm and Cooperative   Speech/Thought Process: Coherent   Insight: Fair   Judgement: Fair    Modes of Intervention: Activity   Patient Response to Interventions:  Receptive   Education Outcome:  Acknowledges education   Clinical Observations/Individualized Feedback: [name] was active in their participation of session activities and group discussion. Pt completed most of the exercises as prompted.   Plan: Continue to engage patient in RT group sessions 2-3x/week.   Jeoffrey BRAVO Celestia, LRT, CTRS 05/23/2024 1:59 PM

## 2024-05-23 NOTE — Plan of Care (Signed)
   Problem: Education: Goal: Utilization of techniques to improve thought processes will improve Outcome: Progressing Goal: Knowledge of the prescribed therapeutic regimen will improve Outcome: Progressing
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
## 2024-05-23 NOTE — Plan of Care (Signed)
   Problem: Education: Goal: Utilization of techniques to improve thought processes will improve Outcome: Progressing Goal: Knowledge of the prescribed therapeutic regimen will improve Outcome: Progressing   Problem: Activity: Goal: Interest or engagement in leisure activities will improve Outcome: Progressing Goal: Imbalance in normal sleep/wake cycle will improve Outcome: Progressing   Problem: Coping: Goal: Coping ability will improve Outcome: Progressing Goal: Will verbalize feelings Outcome: Progressing

## 2024-05-23 NOTE — Group Note (Signed)
 LCSW Group Therapy Note  Group Date: 05/23/2024 Start Time: 1315 End Time: 1400   Type of Therapy and Topic:  Group Therapy: Positive Affirmations  Participation Level:  Did Not Attend   Description of Group:   This group addressed positive affirmation towards self and others.  Patients went around the room and identified two positive things about themselves and two positive things about a peer in the room.  Patients reflected on how it felt to share something positive with others, to identify positive things about themselves, and to hear positive things from others/ Patients were encouraged to have a daily reflection of positive characteristics or circumstances.   Therapeutic Goals: Patients will verbalize two of their positive qualities Patients will demonstrate empathy for others by stating two positive qualities about a peer in the group Patients will verbalize their feelings when voicing positive self affirmations and when voicing positive affirmations of others Patients will discuss the potential positive impact on their wellness/recovery of focusing on positive traits of self and others.  Summary of Patient Progress: X  Therapeutic Modalities:   Cognitive Behavioral Therapy Motivational Interviewing    Lum JONETTA Croft, [place] 05/23/2024  2:14 PM

## 2024-05-23 NOTE — Progress Notes (Signed)
 Tour of Duty:  Eleanor KATHEE Flemings, RN, 05/23/24, Tour of Duty: 0700-1900  SI/HI/AVH: Denies  Self-Reported   Mood: Neutral  Anxiety: Endorses Depression: Endorses Irritability: Denies  Broset  Violence Prevention Guidelines *See Row Information*: Moderate Violence Risk interventions implemented   LBM  Last BM Date : 05/22/24   Pain: present, PRN provided (see MAR)  Patient Refusals (including Rx): No  Shift Summary: Patient observed to be calm on unit but isolate to room most of day, comes out for meals and group. Patient able to make needs known. Patient observed to engage appropriately with staff and peers. Patient taking medications as prescribed. This shift, PRN pain medication requested or required. No observed or reported side effects to medication. No observed or reported agitation, aggression, or other acute emotional distress. No observed or reported physical abnormalities or concerns.     Last Vitals  Vitals Weight: 107.5 kg Temp: (!) 97 F (36.1 C) Temp Source: Oral Pulse Rate: 78 Resp: 18 BP: 138/65 Patient Position: (not recorded)  Admission Type  Psych Admission Type (Psych Patients Only) Admission Status: Voluntary Date 72 hour document signed : (not recorded) Time 72 hour document signed : (not recorded) Provider Notified (First and Last Name) (see details for LINK to note): (not recorded)   Psychosocial Assessment  Psychosocial Assessment Patient Complaints: Anxiety, Depression (8/10) Eye Contact: Fair Facial Expression: Animated Affect: Labile Speech: Logical/coherent Interaction: Assertive Motor Activity: Other (Comment) (WNL) Appearance/Hygiene: In scrubs Behavior Characteristics: Irritable Mood: Preoccupied   Aggressive Behavior  Targets: (not recorded)   Thought Process  Thought Process Coherency: Within Defined Limits Content: Within Defined Limits Delusions: None reported or observed Perception: Within Defined  Limits Hallucination: None reported or observed Judgment: Limited Confusion: None  Danger to Self/Others  Danger to Self Current suicidal ideation?: Denies Description of Suicide Plan: (not recorded) Self-Injurious Behavior: (not recorded) Agreement Not to Harm Self: (not recorded) Description of Agreement: (not recorded) Danger to Others: (not recorded)

## 2024-05-23 NOTE — Group Note (Signed)
 Recreation Therapy Group Note   Group Topic:Stress Management  Group Date: 05/23/2024 Start Time: 1500 End Time: 1600 Facilitators: Celestia Jeoffrey BRAVO, LRT, CTRS Location: Courtyard  Group Description: Outdoor Recreation. Patients had the option to play corn hole, ring toss, bowling or listening to music while outside in the courtyard getting fresh air and sunlight. Patients helped water and prune the raised garden beds. LRT and patients discussed things that they enjoy doing in their free time outside of the hospital. LRT encouraged patients to drink water after being active and getting their heart rate up.   Goal Area(s) Addressed: Patient will identify leisure interests.  Patient will practice healthy decision making. Patient will engage in recreation activity   Affect/Mood: N/A   Participation Level: Did not attend    Clinical Observations/Individualized Feedback: Patient did not attend group.   Plan: Continue to engage patient in RT group sessions 2-3x/week.   [address], LRT, CTRS 05/23/2024 5:20 PM

## 2024-05-23 NOTE — Group Note (Signed)
 Date:  05/23/2024 Time:  4:17 AM  Group Topic/Focus:  Wrap-Up Group:   The focus of this group is to help patients review their daily goal of treatment and discuss progress on daily workbooks.    Participation Level:  Active  Participation Quality:  Appropriate  Affect:  Appropriate  Cognitive:  Alert  Insight: Appropriate  Engagement in Group:  Engaged  Modes of Intervention:  Discussion  Additional Comments:    Tommas CHRISTELLA Bunker 05/23/2024, 4:17 AM

## 2024-05-24 LAB — AEROBIC/ANAEROBIC CULTURE W GRAM STAIN (SURGICAL/DEEP WOUND): Gram Stain: NONE SEEN

## 2024-05-24 NOTE — Progress Notes (Signed)
 Calm and cooperative. Easily agitated. C/o back pain. PRNs given for pain/discomfort. Tol well. Abd wound dsg intact. No behavior issues noted. Q 15 min checks maintained for safety.     05/23/24 2200  Psych Admission Type (Psych Patients Only)  Admission Status Voluntary  Psychosocial Assessment  Patient Complaints None  Eye Contact Fair  Facial Expression Animated  Affect Anxious  Speech Logical/coherent  Interaction Assertive  Motor Activity Other (Comment) (WNL)  Appearance/Hygiene In scrubs  Behavior Characteristics Calm  Mood Preoccupied  Thought Process  Coherency WDL  Content WDL  Delusions None reported or observed  Perception WDL  Hallucination None reported or observed  Judgment Impaired  Confusion None  Danger to Self  Current suicidal ideation? Denies
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
## 2024-05-24 NOTE — [facility] Counselor (Signed)
 Pt reported to CSW that he contacted [facility] and that they are requesting a call from CSW.   CSW contacted [facility] [place] [phone])  They report that pt cannot be on Baclofen  or Oxycodone  if admitted to the program.   CSW informed pt.   Lum Croft, MSW, [place] 05/24/2024 3:46 PM

## 2024-05-24 NOTE — [facility] Counselor (Signed)
 CSW contacted [facility] [phone]) per pt's request.   CSW provided Bill the contact information for the unit should he need to speak with pt.  Bill reports he will call back if anything becomes available.   Lum Croft, MSW, [place] 05/24/2024 3:49 PM

## 2024-05-24 NOTE — Group Note (Signed)
 Physical/Occupational Therapy Group Note  Group Topic: Pain Management and Coping   Group Date: 05/24/2024 Start Time: 1300 End Time: 1350 Facilitators: Clive Warren CROME, OT   Group Description:  Group discussed impact of chronic/acute pain on safety and independence with functional tasks and impact on mental health.  Identified and discussed any previously learned or implemented strategies used.  Discussed and reviewed cognitive behavioral pain coping strategies to address/improve overall management of pain. Discussed relaxation, distraction techniques, cognitive restructuring, activity pacing/energy conservation, environment/home safety modifications, and role of sleep and sleep hygiene. Allowed time for questions and further discussion.   Therapeutic Goal(s):   Identify and discuss previously utilized pain coping strategies and implications of pain on function/well-being.  Identify and discuss implementing new cognitive behavioral pain coping strategies into daily routines.  Demonstrate understanding and performance of learned cognitive behavioral pain coping strategies.   Individual Participation:  Pt sat in a recliner across the room and attended quietly throughout. Appeared to engage in the progressive muscle relaxation exercise at the end of the session. Did not verbally contribute with or without prompting.   Participation Level: Minimal   Participation Quality: Independent   Behavior: Alert, Calm, Isolative, and Passive   Speech/Thought Process: Did not speak   Affect/Mood: Appropriate and Stable    Insight: Did not participate enough to rate   Judgement: Did not participate enough to rate   Modes of Intervention: Activity, Clarification, Discussion, Education, Exploration, Problem-solving, and Socialization  Patient Response to Interventions:  Attentive and Disengaged   Plan: Continue to engage patient in PT/OT groups 1 - 2x/week.  Arlee Bossard R., MPH, MS, OTR/L ascom  [phone] 05/24/24, 4:43 PM

## 2024-05-24 NOTE — Group Note (Signed)
 Date:  05/25/2024 Time:  12:41 AM  Group Topic/Focus:  Wrap-Up Group:   The focus of this group is to help patients review their daily goal of treatment and discuss progress on daily workbooks.    Participation Level:  Did Not Attend  Participation Quality:     Affect:     Cognitive:     Insight: None  Engagement in Group:  None  Modes of Intervention:     Additional Comments:    [name] 05/25/2024, 12:41 AM

## 2024-05-24 NOTE — Plan of Care (Signed)
   Problem: Education: Goal: Utilization of techniques to improve thought processes will improve Outcome: Progressing Goal: Knowledge of the prescribed therapeutic regimen will improve Outcome: Progressing

## 2024-05-24 NOTE — Group Note (Signed)
 Date:  05/24/2024 Time:  11:19 AM  Group Topic/Focus:  Coping With Mental Health Crisis:   The purpose of this group is to help patients identify strategies for coping with mental health crisis.  Group discusses possible causes of crisis and ways to manage them effectively.    Participation Level:  Did Not Attend   [name] 05/24/2024, 11:19 AM

## 2024-05-24 NOTE — Progress Notes (Signed)
   05/24/24 1400  Psych Admission Type (Psych Patients Only)  Admission Status Voluntary  Psychosocial Assessment  Patient Complaints Depression  Eye Contact Fair  Facial Expression Animated  Affect Anxious  Speech Logical/coherent  Interaction Assertive  Motor Activity Other (Comment) (WNL)  Appearance/Hygiene In scrubs  Behavior Characteristics Cooperative  Mood Preoccupied  Thought Process  Coherency WDL  Content WDL  Delusions None reported or observed  Perception WDL  Hallucination None reported or observed  Judgment Impaired  Confusion None  Danger to Self  Current suicidal ideation? Denies  Agreement Not to Harm Self Yes  Description of Agreement verbal  Danger to Others  Danger to Others None reported or observed

## 2024-05-24 NOTE — [facility] Counselor (Signed)
 CSW provided pt list of [address] for [facility], Information on [facility] [place] and [employer].   Pt reports he cannot go to [facility] because of the labor required and that he has been there in the past.   Lum Croft, MSW, [facility] 05/24/2024 3:26 PM

## 2024-05-24 NOTE — Group Note (Signed)
 Recreation Therapy Group Note   Group Topic:Leisure Education  Group Date: 05/24/2024 Start Time: 1500 End Time: 1600 Facilitators: Celestia Jeoffrey BRAVO, LRT, CTRS Location: Courtyard  Group Description: Outdoor Recreation. Patients had the option to play corn hole, ring toss, bowling or listening to music while outside in the courtyard getting fresh air and sunlight. Patients helped water and prune the raised garden beds. LRT and patients discussed things that they enjoy doing in their free time outside of the hospital. LRT encouraged patients to drink water after being active and getting their heart rate up.   Goal Area(s) Addressed: Patient will identify leisure interests.  Patient will practice healthy decision making. Patient will engage in recreation activity.   Affect/Mood: N/A   Participation Level: Did not attend    Clinical Observations/Individualized Feedback: Patient did not attend group.   Plan: Continue to engage patient in RT group sessions 2-3x/week.   Jeoffrey BRAVO Celestia, LRT, CTRS 05/24/2024 5:32 PM

## 2024-05-25 NOTE — Group Note (Signed)
 Date:  05/25/2024 Time:  9:03 PM  Group Topic/Focus:  Self Care:   The focus of this group is to help patients understand the importance of self-care in order to improve or restore emotional, physical, spiritual, interpersonal, and financial health.    Participation Level:  Active  Participation Quality:  Appropriate  Affect:  Appropriate  Cognitive:  Appropriate  Insight: Good  Engagement in Group:  Engaged  Modes of Intervention:  Discussion  Additional Comments:    Romero Earnie Hope 05/25/2024, 9:03 PM

## 2024-05-25 NOTE — Group Note (Signed)
 Date:  05/25/2024 Time:  9:07 PM  Group Topic/Focus:  Self Care:   The focus of this group is to help patients understand the importance of self-care in order to improve or restore emotional, physical, spiritual, interpersonal, and financial health.    Participation Level:  Active  Participation Quality:  Appropriate  Affect:  Appropriate  Cognitive:  Appropriate  Insight: Appropriate  Engagement in Group:  Engaged  Modes of Intervention:  Discussion  Additional Comments:    [name] 05/25/2024, 9:07 PM

## 2024-05-25 NOTE — Group Note (Signed)
 [facility] LCSW Group Therapy Note   Group Date: 05/25/2024 Start Time: 1400 End Time: 1445   Type of Therapy/Topic:  Group Therapy:  Emotion Regulation  Participation Level:  Did Not Attend   Mood:  Description of Group:    The purpose of this group is to assist patients in learning to regulate negative emotions and experience positive emotions. Patients will be guided to discuss ways in which they have been vulnerable to their negative emotions. These vulnerabilities will be juxtaposed with experiences of positive emotions or situations, and patients challenged to use positive emotions to combat negative ones. Special emphasis will be placed on coping with negative emotions in conflict situations, and patients will process healthy conflict resolution skills.  Therapeutic Goals: Patient will identify two positive emotions or experiences to reflect on in order to balance out negative emotions:  Patient will label two or more emotions that they find the most difficult to experience:  Patient will be able to demonstrate positive conflict resolution skills through discussion or role plays:   Summary of Patient Progress:   Patient did not attend gorup.    Therapeutic Modalities:   Cognitive Behavioral Therapy Feelings Identification Dialectical Behavioral Therapy   Aldo HERO Jevan Gaunt, LCSW
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
## 2024-05-25 NOTE — Group Note (Signed)
 Date:  05/25/2024 Time:  9:09 PM  Group Topic/Focus:  Self Care:   The focus of this group is to help patients understand the importance of self-care in order to improve or restore emotional, physical, spiritual, interpersonal, and financial health.    Participation Level:  Active  Participation Quality:  Appropriate  Affect:  Appropriate  Cognitive:  Appropriate  Insight: Good  Engagement in Group:  Engaged  Modes of Intervention:    Additional Comments:    [name] 05/25/2024, 9:09 PM

## 2024-05-25 NOTE — Group Note (Signed)
 Date:  05/25/2024 Time:  9:07 PM  Group Topic/Focus:  Self Care:   The focus of this group is to help patients understand the importance of self-care in order to improve or restore emotional, physical, spiritual, interpersonal, and financial health.    Participation Level:  Active  Participation Quality:  Appropriate  Affect:  Appropriate  Cognitive:  Appropriate  Insight: Good  Engagement in Group:  Engaged  Modes of Intervention:  Discussion  Additional Comments:    Romero Earnie Hope 05/25/2024, 9:07 PM

## 2024-05-25 NOTE — Progress Notes (Signed)
   05/25/24 1200  Psych Admission Type (Psych Patients Only)  Admission Status Voluntary  Psychosocial Assessment  Patient Complaints Depression  Eye Contact Fair  Facial Expression Animated  Affect Anxious  Speech Logical/coherent  Interaction Assertive  Motor Activity Other (Comment) (WNL)  Appearance/Hygiene In scrubs  Behavior Characteristics Cooperative  Mood Preoccupied  Thought Process  Coherency WDL  Content WDL  Delusions None reported or observed  Perception WDL  Hallucination None reported or observed  Judgment Impaired  Confusion None  Danger to Self  Current suicidal ideation? Denies  Agreement Not to Harm Self Yes  Description of Agreement verbal  Danger to Others  Danger to Others None reported or observed

## 2024-05-25 NOTE — Plan of Care (Signed)
  Problem: Health Behavior/Discharge Planning: Goal: Compliance with therapeutic regimen will improve Outcome: Progressing   

## 2024-05-25 NOTE — Plan of Care (Signed)
   Problem: Self-Concept: Goal: Will verbalize positive feelings about self Outcome: Progressing Goal: Level of anxiety will decrease Outcome: Progressing

## 2024-05-26 MED ORDER — BACLOFEN 10 MG PO TABS: 5.0000 mg | ORAL_TABLET | Freq: Every day | ORAL | Status: DC | PRN

## 2024-05-26 MED ORDER — OXYCODONE HCL 5 MG PO TABS
5.0000 mg | ORAL_TABLET | Freq: Every day | ORAL | Status: DC | PRN
  Administered 2024-05-27 – 2024-05-28 (×4): 10 mg via ORAL
  Filled 2024-05-26 (×2): qty 2

## 2024-05-26 MED ORDER — NAPROXEN 500 MG PO TABS
500.0000 mg | ORAL_TABLET | Freq: Three times a day (TID) | ORAL | Status: DC
  Administered 2024-05-27 – 2024-05-31 (×23): 500 mg via ORAL
  Filled 2024-05-26 (×15): qty 1

## 2024-05-26 MED ORDER — OXYCODONE HCL 5 MG PO TABS: 15.0000 mg | ORAL_TABLET | Freq: Every day | ORAL | Status: DC | PRN

## 2024-05-26 NOTE — Progress Notes (Signed)
   05/26/24 1300  Psych Admission Type (Psych Patients Only)  Admission Status Voluntary  Psychosocial Assessment  Patient Complaints None  Eye Contact Fair  Facial Expression Animated  Affect Anxious  Speech Logical/coherent  Interaction Assertive  Motor Activity Other (Comment) (WNL)  Appearance/Hygiene In scrubs  Behavior Characteristics Cooperative  Mood Preoccupied  Thought Process  Coherency WDL  Content WDL  Delusions None reported or observed  Perception WDL  Hallucination None reported or observed  Judgment Impaired  Confusion None  Danger to Self  Current suicidal ideation? Denies  Agreement Not to Harm Self Yes  Description of Agreement verbal  Danger to Others  Danger to Others None reported or observed
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
## 2024-05-26 NOTE — Progress Notes (Signed)
 Pt states pain no relieved by oxycodone  15mg . Asked for Tylenol  650 PRN.

## 2024-05-26 NOTE — Plan of Care (Signed)
   Problem: Coping: Goal: Coping ability will improve Outcome: Progressing

## 2024-05-26 NOTE — Plan of Care (Signed)
   Problem: Education: Goal: Utilization of techniques to improve thought processes will improve Outcome: Progressing Goal: Knowledge of the prescribed therapeutic regimen will improve Outcome: Progressing

## 2024-05-26 NOTE — BH IP Treatment Plan (Signed)
 Interdisciplinary Treatment and Diagnostic Plan Update  05/26/2024 Time of Session: 10:00 AM  [name] MRN: [id number]  Principal Diagnosis: MDD (major depressive disorder), recurrent episode, severe (HCC)  Secondary Diagnoses: Principal Problem:   MDD (major depressive disorder), recurrent episode, severe (HCC) Active Problems:   Abdominal wall cellulitis   Open wnd lateral abdomen   Thrombocytopenia (HCC)   Current Medications:  Current Facility-Administered Medications  Medication Dose Route Frequency Provider Last Rate Last Admin   acetaminophen  (TYLENOL ) tablet 650 mg  650 mg Oral Q6H PRN Starkes-Perry, Takia S, FNP   650 mg at 05/12/24 1447   alum & mag hydroxide-simeth (MAALOX/MYLANTA) 200-200-20 MG/5ML suspension 30 mL  30 mL Oral Q4H PRN Wilkie Majel RAMAN, FNP   30 mL at 05/23/24 1753   amLODipine  (NORVASC ) tablet 10 mg  10 mg Oral Daily Wilkie Majel RAMAN, FNP   10 mg at 05/26/24 1018   artificial tears ophthalmic solution 1 drop  1 drop Both Eyes PRN Jadapalle, Sree, MD   1 drop at 05/23/24 1754   baclofen  (LIORESAL ) tablet 5 mg  5 mg Oral TID Jadapalle, Sree, MD   5 mg at 05/26/24 1501   docusate sodium  (COLACE) capsule 100 mg  100 mg Oral BID Starkes-Perry, Takia S, FNP   100 mg at 05/26/24 1018   Fe Fum-Vit C-Vit B12-FA (TRIGELS-F FORTE) capsule 1 capsule  1 capsule Oral BID Amin, Sumayya, MD   1 capsule at 05/26/24 1018   gabapentin  (NEURONTIN ) capsule 300 mg  300 mg Oral TID Jadapalle, Sree, MD   300 mg at 05/26/24 1501   lactulose  (CHRONULAC ) 10 GM/15ML solution 20 g  20 g Oral BID Amin, Sumayya, MD   20 g at 05/25/24 2051   melatonin tablet 5 mg  5 mg Oral QHS PRN Wilkie Majel RAMAN, FNP   5 mg at 05/12/24 2104   mirtazapine  (REMERON ) tablet 15 mg  15 mg Oral QHS Starkes-Perry, Takia S, FNP   15 mg at 05/25/24 2045   multivitamin with minerals tablet 1 tablet  1 tablet Oral Daily Wilkie Majel RAMAN, FNP   1 tablet at 05/26/24 1018   nystatin  cream  (MYCOSTATIN )   Topical BID Wilkie Majel RAMAN, FNP   Given at 05/26/24 1020   OLANZapine  (ZYPREXA ) injection 5 mg  5 mg Intramuscular TID PRN Wilkie Majel RAMAN, FNP       OLANZapine  zydis (ZYPREXA ) disintegrating tablet 5 mg  5 mg Oral TID PRN Wilkie Majel RAMAN, FNP       oxyCODONE  (Oxy IR/ROXICODONE ) immediate release tablet 15 mg  15 mg Oral Q6H PRN Amin, Sumayya, MD   15 mg at 05/26/24 1454   pantoprazole  (PROTONIX ) EC tablet 40 mg  40 mg Oral Daily Jadapalle, Sree, MD   40 mg at 05/26/24 1018   polyethylene glycol (MIRALAX  / GLYCOLAX ) packet 17 g  17 g Oral Daily PRN Wilkie Majel RAMAN, FNP       QUEtiapine  (SEROQUEL ) tablet 200 mg  200 mg Oral QHS Shrivastava, Aryendra, MD   200 mg at 05/25/24 2045   rosuvastatin  (CRESTOR ) tablet 20 mg  20 mg Oral Daily Jadapalle, Sree, MD   20 mg at 05/26/24 1019   sertraline  (ZOLOFT ) tablet 100 mg  100 mg Oral Daily Shrivastava, Aryendra, MD   100 mg at 05/26/24 1019   tamsulosin  (FLOMAX ) capsule 0.4 mg  0.4 mg Oral Daily Jadapalle, Sree, MD   0.4 mg at 05/26/24 1018   PTA Medications: Medications Prior to Admission  Medication Sig Dispense Refill Last Dose/Taking   amLODipine  (NORVASC ) 10 MG tablet Take 10 mg by mouth daily.      [Paused] Baclofen  5 MG TABS Take 1 tablet by mouth in the morning, at noon, and at bedtime.      [Paused] diltiazem (CARDIZEM) 60 MG tablet Take 60 mg by mouth 3 (three) times daily. (Patient not taking: Reported on 05/01/2024)      docusate sodium  (COLACE) 100 MG capsule Take 1 capsule (100 mg total) by mouth 2 (two) times daily as needed for mild constipation.      folic acid  (FOLVITE ) 1 MG tablet Take 1 mg by mouth daily. (Patient not taking: Reported on 05/01/2024)      [Paused] furosemide (LASIX) 80 MG tablet Take 80 mg by mouth daily. (Patient not taking: Reported on 05/01/2024)      [Paused] gabapentin  (NEURONTIN ) 300 MG capsule Take 300 mg by mouth 3 (three) times daily.      [Paused] hydrochlorothiazide  (HYDRODIURIL) 12.5 MG tablet Take 12.5 mg by mouth daily as needed (swelling). (Patient not taking: Reported on 05/01/2024)      [Paused] hydrOXYzine (VISTARIL) 25 MG capsule Take 25 mg by mouth 2 (two) times daily. (Patient not taking: Reported on 05/01/2024)      [Paused] lactulose  (CHRONULAC ) 10 GM/15ML solution Take 20 g by mouth 2 (two) times daily. (Patient not taking: Reported on 05/01/2024)      [Paused] losartan  (COZAAR ) 25 MG tablet Take 12.5 mg by mouth every morning. (Patient not taking: Reported on 05/01/2024)      methocarbamol  (ROBAXIN ) 500 MG tablet Take 1 tablet (500 mg total) by mouth every 8 (eight) hours as needed for muscle spasms.      [Paused] mirtazapine  (REMERON ) 15 MG tablet Take 15 mg by mouth at bedtime. (Patient not taking: Reported on 05/01/2024)      Multiple Vitamin (MULTIVITAMIN WITH MINERALS) TABS tablet Take 1 tablet by mouth daily.      nystatin  cream (MYCOSTATIN ) Apply topically 2 (two) times daily as needed for dry skin. 30 g 0    oxyCODONE  (OXY IR/ROXICODONE ) 5 MG immediate release tablet Take 1-2 tablets (5-10 mg total) by mouth every 4 (four) hours as needed (5mg  for moderate pain, 10mg  for severe pain).      [Paused] pantoprazole  (PROTONIX ) 40 MG tablet Take 40 mg by mouth daily. (Patient not taking: Reported on 05/01/2024)      polyethylene glycol (MIRALAX  / GLYCOLAX ) 17 g packet Take 17 g by mouth daily as needed (constipation).      [Paused] QUEtiapine  (SEROQUEL ) 200 MG tablet Take 200 mg by mouth at bedtime. (Patient not taking: Reported on 05/01/2024)      [Paused] rosuvastatin  (CRESTOR ) 20 MG tablet Take 20 mg by mouth daily. (Patient not taking: Reported on 05/01/2024)      [Paused] spironolactone (ALDACTONE) 100 MG tablet Take 100 mg by mouth daily. (Patient not taking: Reported on 05/01/2024)      [Paused] tamsulosin  (FLOMAX ) 0.4 MG CAPS capsule Take 0.4 mg by mouth daily. (Patient not taking: Reported on 05/01/2024)      thiamine  (VITAMIN B-1) 100 MG tablet  Take 1 tablet (100 mg total) by mouth daily.      [Paused] traZODone (DESYREL) 50 MG tablet Take 50 mg by mouth at bedtime. (Patient not taking: Reported on 05/01/2024)       Patient Stressors: Other: housing     Patient Strengths: Ability for insight  Active sense of humor  Capable of  independent living  Communication skills   Treatment Modalities: Medication Management, Group therapy, Case management,  1 to 1 session with clinician, Psychoeducation, Recreational therapy.   Physician Treatment Plan for Primary Diagnosis: MDD (major depressive disorder), recurrent episode, severe (HCC) Long Term Goal(s): Improvement in symptoms so as ready for discharge   Short Term Goals: Ability to identify changes in lifestyle to reduce recurrence of condition will improve Ability to verbalize feelings will improve Ability to disclose and discuss suicidal ideas Ability to demonstrate self-control will improve Ability to identify and develop effective coping behaviors will improve  Medication Management: Evaluate patient's response, side effects, and tolerance of medication regimen.  Therapeutic Interventions: 1 to 1 sessions, Unit Group sessions and Medication administration.  Evaluation of Outcomes: Progressing  Physician Treatment Plan for Secondary Diagnosis: Principal Problem:   MDD (major depressive disorder), recurrent episode, severe (HCC) Active Problems:   Abdominal wall cellulitis   Open wnd lateral abdomen   Thrombocytopenia (HCC)  Long Term Goal(s): Improvement in symptoms so as ready for discharge   Short Term Goals: Ability to identify changes in lifestyle to reduce recurrence of condition will improve Ability to verbalize feelings will improve Ability to disclose and discuss suicidal ideas Ability to demonstrate self-control will improve Ability to identify and develop effective coping behaviors will improve     Medication Management: Evaluate patient's response, side  effects, and tolerance of medication regimen.  Therapeutic Interventions: 1 to 1 sessions, Unit Group sessions and Medication administration.  Evaluation of Outcomes: Progressing   RN Treatment Plan for Primary Diagnosis: MDD (major depressive disorder), recurrent episode, severe (HCC) Long Term Goal(s): Knowledge of disease and therapeutic regimen to maintain health will improve  Short Term Goals: Ability to remain free from injury will improve, Ability to verbalize frustration and anger appropriately will improve, Ability to demonstrate self-control, Ability to participate in decision making will improve, Ability to verbalize feelings will improve, Ability to disclose and discuss suicidal ideas, Ability to identify and develop effective coping behaviors will improve, and Compliance with prescribed medications will improve  Medication Management: RN will administer medications as ordered by provider, will assess and evaluate patient's response and provide education to patient for prescribed medication. RN will report any adverse and/or side effects to prescribing provider.  Therapeutic Interventions: 1 on 1 counseling sessions, Psychoeducation, Medication administration, Evaluate responses to treatment, Monitor vital signs and CBGs as ordered, Perform/monitor CIWA, COWS, AIMS and Fall Risk screenings as ordered, Perform wound care treatments as ordered.  Evaluation of Outcomes: Progressing   LCSW Treatment Plan for Primary Diagnosis: MDD (major depressive disorder), recurrent episode, severe (HCC) Long Term Goal(s): Safe transition to appropriate next level of care at discharge, Engage patient in therapeutic group addressing interpersonal concerns.  Short Term Goals: Engage patient in aftercare planning with referrals and resources, Increase social support, Increase ability to appropriately verbalize feelings, Increase emotional regulation, Facilitate acceptance of mental health diagnosis and  concerns, Facilitate patient progression through stages of change regarding substance use diagnoses and concerns, Identify triggers associated with mental health/substance abuse issues, and Increase skills for wellness and recovery  Therapeutic Interventions: Assess for all discharge needs, 1 to 1 time with Social worker, Explore available resources and support systems, Assess for adequacy in community support network, Educate family and significant other(s) on suicide prevention, Complete Psychosocial Assessment, Interpersonal group therapy.  Evaluation of Outcomes: Progressing   Progress in Treatment: Attending groups: Yes. and No. Participating in groups: Yes. and No. Taking medication as prescribed: Yes. Toleration  medication: Yes. Taking medication as prescribed: Yes. Toleration medication: Yes. Family/Significant other contact made: No, will contact:  CSW will contact if given permission Patient understands diagnosis: Yes. Discussing patient identified problems/goals with staff: Yes. Medical problems stabilized or resolved: Yes. Denies suicidal/homicidal ideation: Yes. Issues/concerns per patient self-inventory: No. Other: None    New problem(s) identified: No, Describe:  None identified  Update 05/16/24: No changes at this time Update 05/21/24: No changes at this time Update 05/26/24: No changes at this time   New Short Term/Long Term Goal(s): elimination of symptoms of psychosis, medication management for mood stabilization; elimination of SI thoughts; development of comprehensive mental wellness/sobriety plan. Update 05/16/24: No changes at this time Update 05/21/24: No changes at this time Update 05/26/24: No changes at this time   Patient Goals:  I want to get better Update 05/16/24: No changes at this time Update 05/21/24: No changes at this time Update 05/26/24: No changes at this time   Discharge Plan or Barriers: CSW will assist with appropriate discharge planning Update 05/16/24: No  changes at this time Update 05/21/24: No changes at this time Update 05/26/24: No changes at this time   Reason for Continuation of Hospitalization: Depression Medication stabilization Suicidal ideation   Estimated Length of Stay: 1 to 7 days Update 05/16/24: TBD Update 05/21/24: TBD Update 05/26/24: 05/28/24   Last 3 [place] Suicide Severity Risk Score: Flowsheet Row Admission (Current) from 05/03/2024 in [facility]  C-SSRS RISK CATEGORY High Risk    Last PHQ 2/9 Scores:     No data to display          Scribe for Treatment Team: Lum JONETTA Croft, [place] 05/26/2024 3:23 PM

## 2024-05-26 NOTE — Group Note (Signed)
 Date:  05/26/2024 Time:  6:24 PM  Group Topic/Focus:  Developing a Wellness Toolbox:   The focus of this group is to help patients develop a wellness toolbox with skills and strategies to promote recovery upon discharge.    Participation Level:  Did Not Attend

## 2024-05-27 NOTE — Group Note (Signed)
 Date:  05/27/2024 Time:  9:08 PM  Group Topic/Focus:  Wrap-Up Group:   The focus of this group is to help patients review their daily goal of treatment and discuss progress on daily workbooks.    Participation Level:  Active  Participation Quality:  Appropriate  Affect:  Appropriate  Cognitive:  Alert  Insight: Appropriate  Engagement in Group:  Engaged  Modes of Intervention:  Discussion  Additional Comments:    [name] Bunker 05/27/2024, 9:08 PM

## 2024-05-27 NOTE — Progress Notes (Signed)
 Patient presents: Flat affect but cooperative. Patient is med compliant and socializes appropriately.    SI/HI/AVH: Denies   Plan: Denies   Groups attended: 0/2   Appetite: Adequate. Attended meals.   Sleep: No sleep disturbances reported.   PRNS: Patient given oxycodone  po prn due to severe pain which helped provide some relief.    Disturbances: No disturbances. Patient remains overall cooperative in milieu.    Questions/concerns: Patient concerned regarding getting into a substance rehab facility and met with social worker for interviews today.   VS: BP 131/62 (BP Location: Left Arm)   Pulse 73   Temp (!) 97.3 F (36.3 C)   Resp 16   Ht 5' 8 (1.727 m)   Wt 107.5 kg   SpO2 98%   BMI 36.04 kg/m

## 2024-05-27 NOTE — Group Note (Signed)
 Date:  05/27/2024 Time:  12:47 PM  Group Topic/Focus:  Wellness Toolbox:   The focus of this group is to discuss various aspects of wellness, balancing those aspects and exploring ways to increase the ability to experience wellness.  Patients will create a wellness toolbox for use upon discharge.    Participation Level:  Did Not Attend  Participation Quality:    Affect:    Cognitive:    Insight:   Engagement in Group:    Modes of Intervention:    Additional Comments:    Calla Wedekind 05/27/2024, 12:47 PM
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
## 2024-05-27 NOTE — Group Note (Signed)
 Date:  05/27/2024 Time:  11:19 PM  Group Topic/Focus:  Wrap-Up Group:   The focus of this group is to help patients review their daily goal of treatment and discuss progress on daily workbooks.    Participation Level:  Did Not Attend  Participation Quality:     Affect:     Cognitive:     Insight: None  Engagement in Group:  None  Modes of Intervention:     Additional Comments:    [name] 05/27/2024, 11:19 PM

## 2024-05-27 NOTE — BHH Counselor (Signed)
 CSW spoke to pt regarding placement.   CSW informed pt that since he has been accepted to a placement in [place], CSW will cease looking for placement at this time.   CSW will contact [facility] to get approval for pt to be sent via [facility] to [facility] location in [place].   CSW will schedule ride after approval is received.   Lum Croft, MSW, [place] 05/27/2024 9:10 PM

## 2024-05-27 NOTE — Progress Notes (Signed)
(  Sleep Hours) -  8.75 (Any PRNs that were needed, meds refused, or side effects to meds)- oxycodone  15 mg, Tylenol  650 mg both for chronic pain 10/10 (Any disturbances and when (visitation, over night)- none (Concerns raised by the patient)- none (SI/HI/AVH)- denies

## 2024-05-27 NOTE — Group Note (Signed)
 Recreation Therapy Group Note   Group Topic:Coping Skills  Group Date: 05/27/2024 Start Time: 1350 End Time: 1450 Facilitators: Celestia Jeoffrey BRAVO, LRT, CTRS Location: Courtyard  Group Description: Music. Patients encouraged to name their favorite song(s) for LRT to play song through speaker for group to hear, while in the courtyard getting fresh air and sunlight. Patients educated on the definition of leisure and the importance of having different leisure interests outside of the hospital. Group discussed how leisure activities can often be used as Pharmacologist and that listening to music and being outside are examples.    Goal Area(s) Addressed:  Patient will identify a current leisure interest.  Patient will practice making a positive decision. Patient will have the opportunity to try a new leisure activity.   Affect/Mood: N/A   Participation Level: Did not attend    Clinical Observations/Individualized Feedback: Patient did not attend group.   Plan: Continue to engage patient in RT group sessions 2-3x/week.   Jeoffrey BRAVO Celestia, LRT, CTRS 05/27/2024 5:13 PM

## 2024-05-27 NOTE — Progress Notes (Signed)
   05/26/24 2030  Psych Admission Type (Psych Patients Only)  Admission Status Voluntary  Psychosocial Assessment  Patient Complaints None  Eye Contact Fair  Facial Expression Anxious  Affect Anxious  Speech Logical/coherent  Interaction Assertive  Motor Activity Slow  Appearance/Hygiene In scrubs  Behavior Characteristics Cooperative;Appropriate to situation;Anxious  Mood Anxious;Pleasant  Thought Process  Coherency WDL  Content WDL  Delusions None reported or observed  Perception WDL  Hallucination None reported or observed  Judgment Impaired  Confusion None  Danger to Self  Current suicidal ideation? Denies  Agreement Not to Harm Self Yes  Description of Agreement verbal  Danger to Others  Danger to Others None reported or observed   Progress note   D: Pt seen in dayroom. Pt denies SI, HI, AVH. Pt rates pain  10/10 as chronic pain. Pt rates anxiety  0/10 and depression  0/10. Pt states he is glad to be alive today. Pt asked to have dressing changed after he showers this evening. States LBM = 05/25/24. Pt watching television. Participated in group this evening. No other concerns noted at this time.  A: Pt provided support and encouragement. Pt given scheduled medication as prescribed. PRNs as appropriate. Q15 min checks for safety.   R: Pt safe on the unit. Will continue to monitor.

## 2024-05-27 NOTE — BHH Counselor (Signed)
 CSW sat down with pt to work on disposition plans.   CSW contacted the following agencies/programs:    [facility] [phone]). They report they can take pt on Wednesday 05/29/24. They provided CSW contact information for Edsel [phone]) and Cathlyn ([phone]) who CSW can reach to get the address of the facility.   CSW contacted [facility] admissions [phone]) . PT completed pre-screener with CSW present. [facility] reports CSW needs to send information to them for admission.   CSW contacted Edsel at [facility] [place].  Danielle reports that pt can come anytime between 8 AM and 5 PM and that pt needs his ID and Psychologist, forensic. She reports if pt wants to, he is able to get sheets for a twin bed, his food, an AA book.   Edsel reports the address as [address], [place], [place], [address]. Edsel also asks that pt does not go to the leasing office and goes straight to the apartment.   CSW informed pt.   Lum Croft, MSW, [place] 05/27/2024 3:41 PM

## 2024-05-27 NOTE — Plan of Care (Signed)
   Problem: Education: Goal: Knowledge of the prescribed therapeutic regimen will improve Outcome: Progressing   Problem: Activity: Goal: Interest or engagement in leisure activities will improve Outcome: Progressing   Problem: Health Behavior/Discharge Planning: Goal: Compliance with therapeutic regimen will improve Outcome: Progressing

## 2024-05-27 NOTE — Plan of Care (Signed)
  Problem: Education: Goal: Knowledge of the prescribed therapeutic regimen will improve Outcome: Progressing   Problem: Activity: Goal: Interest or engagement in leisure activities will improve Outcome: Progressing Goal: Imbalance in normal sleep/wake cycle will improve Outcome: Progressing   Problem: Coping: Goal: Will verbalize feelings Outcome: Progressing   Problem: Health Behavior/Discharge Planning: Goal: Ability to make decisions will improve Outcome: Progressing Goal: Compliance with therapeutic regimen will improve Outcome: Progressing   Problem: Safety: Goal: Ability to disclose and discuss suicidal ideas will improve Outcome: Progressing   Problem: Self-Concept: Goal: Level of anxiety will decrease Outcome: Progressing   Problem: Skin Integrity: Goal: Skin integrity will improve Outcome: Progressing

## 2024-05-28 MED ORDER — BACLOFEN 10 MG PO TABS
5.0000 mg | ORAL_TABLET | Freq: Three times a day (TID) | ORAL | Status: DC
  Administered 2024-05-28 – 2024-05-31 (×16): 5 mg via ORAL
  Filled 2024-05-28 (×11): qty 0.5

## 2024-05-28 MED ORDER — DICYCLOMINE HCL 10 MG PO CAPS
10.0000 mg | ORAL_CAPSULE | Freq: Three times a day (TID) | ORAL | Status: DC
  Administered 2024-05-28 – 2024-05-31 (×15): 10 mg via ORAL
  Filled 2024-05-28 (×11): qty 1

## 2024-05-28 MED ORDER — OXYCODONE HCL 5 MG PO TABS
5.0000 mg | ORAL_TABLET | Freq: Three times a day (TID) | ORAL | Status: DC | PRN
  Administered 2024-05-28 – 2024-05-31 (×9): 10 mg via ORAL
  Filled 2024-05-28 (×6): qty 2

## 2024-05-28 MED ORDER — CLONIDINE HCL 0.1 MG PO TABS
0.1000 mg | ORAL_TABLET | Freq: Two times a day (BID) | ORAL | Status: DC | PRN
  Administered 2024-05-28 – 2024-05-29 (×4): 0.1 mg via ORAL
  Filled 2024-05-28 (×2): qty 1
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
## 2024-05-28 NOTE — Plan of Care (Signed)
  Problem: Health Behavior/Discharge Planning: Goal: Compliance with therapeutic regimen will improve Outcome: Progressing   Problem: Self-Concept: Goal: Level of anxiety will decrease Outcome: Progressing

## 2024-05-28 NOTE — Progress Notes (Signed)
 Patient presents: Reporting malaise this morning and stating he is hurting all over. Patient reported vomiting very early this morning and stated sweating throughout the night. Patient refused nausea medication but was compliant with scheduled medication and prn pain medication. Patient denies SI,HI, and A/V/H with no plan or intent.    SI/HI/AVH: Denies   Plan: Denies   Groups attended: 0/3   Appetite: Adequate. Attended meals.   Sleep: Patient stated he did not get good sleep due to sweating throughout the night due to withdrawal symptoms.   PRNS: Oxycodone  po prn x2 due to generalized pain which helps provide relief.    Disturbances: No disturbances. Patient remains cooperative in milieu.    Questions/concerns: New scheduled medications given to patient to aid in the relief of withdrawal symptoms as ordered by provider. Patient remains stable and safe in unit. No further questions or concerns.   VS: BP (!) 145/79 (BP Location: Left Arm)   Pulse 74   Temp (!) 97.4 F (36.3 C)   Resp 18   Ht 5' 8 (1.727 m)   Wt 107.5 kg   SpO2 97%   BMI 36.04 kg/m

## 2024-05-28 NOTE — Group Note (Signed)
 Date:  05/28/2024 Time:  11:04 AM  Group Topic/Focus:  Gratitude Group The purpose of this group is a exercise that goes over things they are grateful discussing some facts about their lives and how can they improve their positive thinking.    Participation Level:  Did Not Attend [name] 05/28/2024, 11:04 AM

## 2024-05-28 NOTE — Progress Notes (Signed)
   05/28/24 1955  Psych Admission Type (Psych Patients Only)  Admission Status Voluntary  Psychosocial Assessment  Patient Complaints Other (Comment) (c/o withdrawal symptoms because his oxycodone  was temporarily stopped)  Eye Contact Fair  Facial Expression Animated  Affect Anxious  Speech Logical/coherent  Interaction Assertive  Motor Activity Slow  Appearance/Hygiene In scrubs  Behavior Characteristics Cooperative;Anxious  Mood Anxious  Thought Process  Coherency WDL  Content WDL  Delusions None reported or observed  Perception WDL  Hallucination None reported or observed  Judgment Poor  Confusion None  Danger to Self  Current suicidal ideation? Denies  Agreement Not to Harm Self Yes  Description of Agreement verbal  Danger to Others  Danger to Others None reported or observed   Progress note   D: Pt seen in room. Pt denies SI, HI, AVH. Pt rates pain  10/10. I stay in pain because of my back. Pt rates anxiety  0/10 and depression  0/10. Pt does c/o withdrawal symptoms r/t his oxycodone  being reduced. But, since pt refused placement at [facility] in [place], his oxycodone  dose has been added to his MAR again. COWS=3. No other concerns noted at this time.  A: Pt provided support and encouragement. Pt given scheduled medication as prescribed. PRNs as appropriate. Q15 min checks for safety.   R: Pt safe on the unit. Will continue to monitor.

## 2024-05-28 NOTE — Group Note (Signed)
 Physical/Occupational Therapy Group Note  Group Topic: Functional, Dynamic Balance   Group Date: 05/28/2024 Start Time: 1530 End Time: 1600 Facilitators: Deagan Sevin, Alm Hamilton, PT   Group Description: Group discussed impact of balance on safety and independence with functional tasks.  Identified and discussed any self-perceived balance deficits to personalize information.  Discussed and reviewed strategies to address/improve balance deficits: use of assist devices, activity pacing/energy conservation, environment/home safety modifications, focusing attention/minimizing distraction.  Reviewed and participated with standing LE therex designed to target dynamic balance reactions and LE strength/stability; provided handouts with HEP to be utilized outside of group time as appropriate.  Allowed time for questions and further discussion on any balance or mobility concerns/needs.  Therapeutic Goal(s):  Identify and discuss any individual balance deficits and functional implications. Identify and discuss any environmental/home safety modifications that can optimize balance and safety for mobility within the home. Demonstrate understanding and performance of standing therex designed to target dynamic balance deficits.  Individual Participation:  Did not attend  Participation Level:   Participation Quality:   Behavior:   Speech/Thought Process:   Affect/Mood:   Insight:   Judgement:   Modes of Intervention:   Patient Response to Interventions:    Plan: Continue to engage patient in PT/OT groups 1 - 2x/week.  CHARM Hamilton Bertin PT, DPT 05/28/24, 4:40 PM

## 2024-05-28 NOTE — Group Note (Signed)
 Recreation Therapy Group Note   Group Topic:Other  Group Date: 05/28/2024 Start Time: 1400 End Time: 1500 Facilitators: Celestia Jeoffrey BRAVO, LRT, CTRS Location: Courtyard  Group Description: Outdoor Recreation. Patients had the option to play corn hole, ring toss, bowling or listening to music while outside in the courtyard getting fresh air and sunlight. Patients helped water and prune the raised garden beds. LRT and patients discussed things that they enjoy doing in their free time outside of the hospital. LRT encouraged patients to drink water after being active and getting their heart rate up.   Goal Area(s) Addressed: Patient will identify leisure interests.  Patient will practice healthy decision making. Patient will engage in recreation activity.   Affect/Mood: N/A   Participation Level: Did not attend    Clinical Observations/Individualized Feedback: Patient did not attend group.   Plan: Continue to engage patient in RT group sessions 2-3x/week.   Jeoffrey BRAVO Celestia, LRT, CTRS 05/28/2024 4:40 PM

## 2024-05-28 NOTE — Group Note (Signed)
 Date:  05/28/2024 Time:  9:09 PM  Group Topic/Focus:  Wrap-Up Group:   The focus of this group is to help patients review their daily goal of treatment and discuss progress on daily workbooks.    Participation Level:  Did Not Attend  Participation Quality:     Affect:     Cognitive:     Insight: None  Engagement in Group:  None  Modes of Intervention:     Additional Comments:    [name] 05/28/2024, 9:09 PM

## 2024-05-28 NOTE — Progress Notes (Signed)
   05/27/24 2300  Psych Admission Type (Psych Patients Only)  Admission Status Voluntary  Psychosocial Assessment  Patient Complaints Anxiety  Eye Contact Brief  Facial Expression Flat  Affect Anxious  Speech Logical/coherent  Interaction Assertive  Motor Activity Slow  Appearance/Hygiene In scrubs  Behavior Characteristics Cooperative;Appropriate to situation  Mood Anxious  Thought Process  Coherency WDL  Content WDL  Delusions None reported or observed  Perception WDL  Hallucination None reported or observed  Judgment Poor  Confusion None  Danger to Self  Current suicidal ideation? Denies  Self-Injurious Behavior No self-injurious ideation or behavior indicators observed or expressed   Agreement Not to Harm Self Yes  Description of Agreement Verbal  Danger to Others  Danger to Others None reported or observed

## 2024-05-28 NOTE — Plan of Care (Signed)
  Problem: Health Behavior/Discharge Planning: Goal: Compliance with therapeutic regimen will improve Outcome: Progressing   Problem: Safety: Goal: Periods of time without injury will increase Outcome: Progressing

## 2024-05-29 NOTE — Progress Notes (Signed)
(  Sleep Hours) - 10.25 (Any PRNs that were needed, meds refused, or side effects to meds)- artificial tears, clonidine  0.1 mg (opioid withdrawals) given; pt refused lactulose  (LBM-05/28/24) and nystatin  cream (Any disturbances and when (visitation, over night)- none (Concerns raised by the patient)- concerned about withdrawal symptoms. Pt given PRNs and told that these symptoms should subside because he is back on oxycodone  now q8h. (SI/HI/AVH)- denies

## 2024-05-29 NOTE — Plan of Care (Signed)
  Problem: Education: Goal: Knowledge of the prescribed therapeutic regimen will improve Outcome: Progressing   Problem: Activity: Goal: Imbalance in normal sleep/wake cycle will improve Outcome: Progressing   Problem: Coping: Goal: Coping ability will improve Outcome: Progressing Goal: Will verbalize feelings Outcome: Progressing   Problem: Health Behavior/Discharge Planning: Goal: Ability to make decisions will improve Outcome: Progressing

## 2024-05-29 NOTE — Group Note (Signed)
 Date:  05/29/2024 Time:  9:30 PM  Group Topic/Focus:  Identifying Needs:   The focus of this group is to help patients identify their personal needs that have been historically problematic and identify healthy behaviors to address their needs.    Participation Level:  Active  Participation Quality:  Appropriate  Affect:  Appropriate  Cognitive:  Appropriate  Insight: Appropriate  Engagement in Group:  Engaged  Modes of Intervention:  Education  Additional Comments:    Laymon ONEIDA Finder 05/29/2024, 9:30 PM

## 2024-05-29 NOTE — Plan of Care (Signed)
  Problem: Education: Goal: Knowledge of the prescribed therapeutic regimen will improve Outcome: Progressing   Problem: Education: Goal: Emotional status will improve Outcome: Progressing

## 2024-05-29 NOTE — Progress Notes (Signed)
   05/29/24 1000  Psych Admission Type (Psych Patients Only)  Admission Status Voluntary  Psychosocial Assessment  Patient Complaints Worrying  Eye Contact Poor  Facial Expression Anxious  Affect Anxious  Speech Logical/coherent  Interaction Assertive  Motor Activity Slow  Appearance/Hygiene In scrubs  Behavior Characteristics Cooperative;Anxious  Mood Anxious  Thought Process  Coherency WDL  Content WDL  Delusions None reported or observed  Perception WDL  Hallucination None reported or observed  Judgment Poor  Confusion None  Danger to Self  Current suicidal ideation? Denies  Self-Injurious Behavior No self-injurious ideation or behavior indicators observed or expressed   Agreement Not to Harm Self Yes  Description of Agreement Verbal  Danger to Others  Danger to Others None reported or observed
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
## 2024-05-29 NOTE — Group Note (Signed)
 Date:  05/29/2024 Time:  2:11 PM  Group Topic/Focus:  Managing Feelings:   The focus of this group is to identify what feelings patients have difficulty handling and develop a plan to handle them in a healthier way upon discharge.    Participation Level:  Did Not Attend  [name] 05/29/2024, 2:11 PM

## 2024-05-30 MED ORDER — DOXEPIN HCL 50 MG PO CAPS
50.0000 mg | ORAL_CAPSULE | Freq: Every day | ORAL | Status: DC
  Administered 2024-05-30: 50 mg via ORAL
  Filled 2024-05-30: qty 1

## 2024-05-30 NOTE — Group Note (Signed)
 Date:  05/30/2024 Time:  10:57 AM  Group Topic/Focus:  Dimensions of Wellness:   The focus of this group is to introduce the topic of wellness and discuss the role each dimension of wellness plays in total health.    Participation Level:  Did Not Attend   [name] 05/30/2024, 10:57 AM

## 2024-05-30 NOTE — Group Note (Signed)
 Recreation Therapy Group Note   Group Topic:Relaxation  Group Date: 05/30/2024 Start Time: 1400 End Time: 1500 Facilitators: Celestia Jeoffrey BRAVO, LRT, CTRS Location: Courtyard  Group Description: Music. Patients encouraged to name their favorite song(s) for LRT to play song through speaker for group to hear, while in the courtyard getting fresh air and sunlight. Patients educated on the definition of leisure and the importance of having different leisure interests outside of the hospital. Group discussed how leisure activities can often be used as Pharmacologist and that listening to music and being outside are examples.    Goal Area(s) Addressed:  Patient will identify a current leisure interest.  Patient will practice making a positive decision. Patient will have the opportunity to try a new leisure activity.   Affect/Mood: N/A   Participation Level: Did not attend    Clinical Observations/Individualized Feedback: Patient did not attend group.   Plan: Continue to engage patient in RT group sessions 2-3x/week.   Jeoffrey BRAVO Celestia, LRT, CTRS 05/30/2024 5:34 PM

## 2024-05-30 NOTE — Progress Notes (Signed)
 Estimated Sleeping Duration (Last 24 Hours): 7.50-9.75 hours   (Sleep Hours) - 7.5-9.75hrs (Any PRNs that were needed, meds refused, or side effects to meds)- melatonin 5mg  (sleep) and oxycodone  10mg  ( 10/10 neck and back pain), both noted to be effective. (Any disturbances and when (visitation, over night)- none (Concerns raised by the patient)-  (SI/HI/AVH)- denied all
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
## 2024-05-30 NOTE — BHH Counselor (Signed)
 CSW referred pt to [facility] CST Team.    Lum Croft, MSW, [facility] 05/30/2024 9:35 AM

## 2024-05-30 NOTE — Plan of Care (Signed)
 ?  Problem: Education: ?Goal: Knowledge of the prescribed therapeutic regimen will improve ?Outcome: Progressing ?  ?Problem: Coping: ?Goal: Coping ability will improve ?Outcome: Progressing ?Goal: Will verbalize feelings ?Outcome: Progressing ?  ?

## 2024-05-30 NOTE — Progress Notes (Signed)
 Patient has been cooperative with treatment, he has been medication compliant, no new issues to report on shift at this time.

## 2024-05-30 NOTE — Plan of Care (Signed)
   Problem: Coping: Goal: Coping ability will improve Outcome: Progressing

## 2024-05-31 ENCOUNTER — Other Ambulatory Visit: Payer: Self-pay

## 2024-05-31 MED ORDER — DOXEPIN HCL 50 MG PO CAPS
50.0000 mg | ORAL_CAPSULE | Freq: Every day | ORAL | 0 refills | Status: DC
  Filled 2024-05-31: qty 30, 30d supply, fill #0

## 2024-05-31 MED ORDER — BACLOFEN 5 MG PO TABS
5.0000 mg | ORAL_TABLET | Freq: Three times a day (TID) | ORAL | 0 refills | Status: AC
  Filled 2024-05-31: qty 30, 10d supply, fill #0

## 2024-05-31 MED ORDER — TAMSULOSIN HCL 0.4 MG PO CAPS
0.4000 mg | ORAL_CAPSULE | Freq: Every day | ORAL | 0 refills | Status: DC
  Filled 2024-05-31: qty 30, 30d supply, fill #0

## 2024-05-31 MED ORDER — SERTRALINE HCL 100 MG PO TABS
100.0000 mg | ORAL_TABLET | Freq: Every day | ORAL | 0 refills | Status: DC
  Filled 2024-05-31: qty 30, 30d supply, fill #0

## 2024-05-31 MED ORDER — FUROSEMIDE 80 MG PO TABS
80.0000 mg | ORAL_TABLET | Freq: Every day | ORAL | 0 refills | Status: DC
  Filled 2024-05-31: qty 30, 30d supply, fill #0

## 2024-05-31 MED ORDER — OXYCODONE HCL 5 MG PO TABS
5.0000 mg | ORAL_TABLET | Freq: Three times a day (TID) | ORAL | 0 refills | Status: DC | PRN
  Filled 2024-05-31: qty 30, 5d supply, fill #0

## 2024-05-31 MED ORDER — GABAPENTIN 300 MG PO CAPS
300.0000 mg | ORAL_CAPSULE | Freq: Three times a day (TID) | ORAL | 0 refills | Status: AC
  Filled 2024-05-31: qty 90, 30d supply, fill #0

## 2024-05-31 MED ORDER — ROSUVASTATIN CALCIUM 20 MG PO TABS
20.0000 mg | ORAL_TABLET | Freq: Every day | ORAL | 0 refills | Status: DC
Start: 2024-06-01 — End: 2024-08-06
  Filled 2024-05-31: qty 30, 30d supply, fill #0

## 2024-05-31 MED ORDER — POLYVINYL ALCOHOL 1.4 % OP SOLN
1.0000 [drp] | OPHTHALMIC | 0 refills | Status: DC | PRN
  Filled 2024-05-31: qty 15, fill #0

## 2024-05-31 MED ORDER — DOCUSATE SODIUM 100 MG PO CAPS
100.0000 mg | ORAL_CAPSULE | Freq: Two times a day (BID) | ORAL | 0 refills | Status: DC
  Filled 2024-05-31: qty 10, 5d supply, fill #0

## 2024-05-31 MED ORDER — NAPROXEN 500 MG PO TABS
500.0000 mg | ORAL_TABLET | Freq: Three times a day (TID) | ORAL | 0 refills | Status: DC
  Filled 2024-05-31: qty 90, 30d supply, fill #0

## 2024-05-31 MED ORDER — MIRTAZAPINE 15 MG PO TABS
15.0000 mg | ORAL_TABLET | Freq: Every day | ORAL | 0 refills | Status: DC
  Filled 2024-05-31: qty 30, 30d supply, fill #0

## 2024-05-31 MED ORDER — QUETIAPINE FUMARATE 200 MG PO TABS
200.0000 mg | ORAL_TABLET | Freq: Every day | ORAL | 0 refills | Status: DC
  Filled 2024-05-31: qty 30, 30d supply, fill #0

## 2024-05-31 MED ORDER — ADULT MULTIVITAMIN W/MINERALS CH
1.0000 | ORAL_TABLET | Freq: Every day | ORAL | 0 refills | Status: DC
  Filled 2024-05-31: qty 30, 30d supply, fill #0

## 2024-05-31 MED ORDER — PANTOPRAZOLE SODIUM 40 MG PO TBEC
40.0000 mg | DELAYED_RELEASE_TABLET | Freq: Every day | ORAL | 0 refills | Status: DC
  Filled 2024-05-31: qty 30, 30d supply, fill #0

## 2024-05-31 MED ORDER — FE FUM-VIT C-VIT B12-FA 460-60-0.01-1 MG PO CAPS
1.0000 | ORAL_CAPSULE | Freq: Two times a day (BID) | ORAL | 0 refills | Status: DC
  Filled 2024-05-31: qty 60, 30d supply, fill #0

## 2024-05-31 NOTE — Group Note (Signed)
 Date:  05/31/2024 Time:  11:39 AM  Group Topic/Focus:  Rediscovering Joy:   The focus of this group is to explore various ways to relieve stress in a positive manner.    Participation Level:  Did Not Attend   Camellia HERO Reagen Haberman 05/31/2024, 11:39 AM

## 2024-05-31 NOTE — Progress Notes (Signed)
 Estimated Sleeping Duration (Last 24 Hours): 10.25-13.50 hours   (Sleep Hours) -10.25-13.5 hrs  (Any PRNs that were needed, meds refused, or side effects to meds)- Refused  HS dose of lactulose  20g, and received PRN melatonin 5mg  (sleep) and oxycodone  10mg  (back/neck pain) with HS medications, both noted to be effective.   (Any disturbances and when (visitation, over night)- none noted  (Concerns raised by the patient)- none noted  (SI/HI/AVH)- Denied all  Remained mostly isolative to self and room for majority of the evening, only out for evening snacks. Minimal interactions noted amongst peers. Only communicated to staff to voice his needs. Slept throughout the night without any disturbances.     05/31/24 0200  Psychosocial Assessment  Patient Complaints Anxiety  Eye Contact Fair  Facial Expression Anxious;Worried  Affect Sad  Surveyor, minerals Activity Slow  Appearance/Hygiene In scrubs  Behavior Characteristics Cooperative;Appropriate to situation  Mood Fearful  Thought Process  Coherency WDL  Content WDL  Delusions None reported or observed  Perception WDL  Hallucination None reported or observed  Judgment Impaired  Confusion None  Danger to Self  Current suicidal ideation? Denies  Self-Injurious Behavior No self-injurious ideation or behavior indicators observed or expressed   Agreement Not to Harm Self Yes  Description of Agreement verbal  Danger to Others  Danger to Others None reported or observed

## 2024-05-31 NOTE — BHH Counselor (Signed)
 Pt scheduled to discharge today.   CSW tried to secure housing for pt throughout his stay, unable to secure prior to his discharge.   Pt does not qualify for rehab, sober living, or [facility] due to being on oxycodone , gabapentin , and baclofen  for chronic pain.   CSW offered pt boarding house, but   Provider attempted to take pt off of the medication to increase chances of securing placement.   Placement was secured but pt withdrew from medications and needed to be put back on medications.   Pt does not qualify for SNF as he has no skillable needs.   Pt would not be appropriate for nursing home or assisted living facility because of his hx of multiple suicide attempts.  CSW had multiple conversations with pt throughout his stay about barriers to finding placement.   CSW offered pt boarding houses. Pt reports he cannot stay in a boarding house because of personalities. He reports he does not get a long with everyone and that he knows how he is and does not want to end up back in prison.   CSW attempted to refer pt to ACT services but ACT services require a diagnosis of a psychotic disorder or disorder with psychotic features .  As a last option CSW asked pt if he would be willing to go to an extended stay motel with CST services.   Pt reports he is not willing to spend the money on an extended stay hotel.  Pt reports he makes $1200 per month and an extended stay hotel would be $700 - $1000 dollars, which he is not willing to spend.   Pt reports [place]  CSW reported information to provider.   Provider and CSW had a conversation with pt regarding barriers to placement. CSW inquires about what will different for pt regarding him harming himself when he discharges from the hospital.   Pt reports that he does not know. He reports that he can only try not to hurt himself and that he feels the team has done what they can for him. He reports  he would like to discharge.   Pt reports he will feel better about discharge if he had something to help with his sleep.   CSW informed provider. Provider made changes to pt's medication.   Lum Croft, MSW, [place] 05/31/2024 10:02 AM

## 2024-05-31 NOTE — Plan of Care (Signed)
  Problem: Education: Goal: Utilization of techniques to improve thought processes will improve Outcome: Progressing   Problem: Coping: Goal: Will verbalize feelings Outcome: Progressing   Problem: Health Behavior/Discharge Planning: Goal: Compliance with therapeutic regimen will improve Outcome: Progressing   Problem: Self-Concept: Goal: Level of anxiety will decrease Outcome: Progressing
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
## 2024-05-31 NOTE — [facility] Suicide Risk Assessment (Signed)
 [facility] Discharge Suicide Risk Assessment   Principal Problem: MDD (major depressive disorder), recurrent episode, severe (HCC) Discharge Diagnoses: Principal Problem:   MDD (major depressive disorder), recurrent episode, severe (HCC) Active Problems:   Abdominal wall cellulitis   Open wnd lateral abdomen   Thrombocytopenia (HCC)   Total Time spent with patient: 30 minutes  Musculoskeletal: Strength & Muscle Tone: within normal limits Gait & Station: normal Patient leans: N/A  Psychiatric Specialty Exam  Presentation  General Appearance:  Appropriate for Environment; Casual  Eye Contact: Fair  Speech: Clear and Coherent  Speech Volume: Normal  Handedness: Right   Mood and Affect  Mood: Euthymic  Duration of Depression Symptoms: No data recorded Affect: Appropriate   Thought Process  Thought Processes: Coherent  Descriptions of Associations:Intact  Orientation:Full (Time, Place and Person)  Thought Content:Logical  History of Schizophrenia/Schizoaffective disorder:No data recorded Duration of Psychotic Symptoms:No data recorded Hallucinations:Hallucinations: None  Ideas of Reference:None  Suicidal Thoughts:Suicidal Thoughts: No  Homicidal Thoughts:Homicidal Thoughts: No   Sensorium  Memory: Recent Fair; Remote Fair; Immediate Fair  Judgment: Fair  Insight: Fair   Art therapist  Concentration: Fair  Attention Span: Fair  Recall: [employer] of Knowledge: Fair  Language: Fair   Psychomotor Activity  Psychomotor Activity: Psychomotor Activity: Normal   Assets  Assets: Communication Skills; Desire for Improvement; Social Support   Sleep  Sleep: Sleep: Fair  Estimated Sleeping Duration (Last 24 Hours): 9.75-12.25 hours  Physical Exam: Physical Exam ROS Blood pressure 127/74, pulse 66, temperature 98.4 F (36.9 C), resp. rate 18, height 5' 8 (1.727 m), weight 107.5 kg, SpO2 96%. Body mass index is 36.04  kg/m.  Mental Status Per Nursing Assessment::   On Admission:  Self-harm thoughts, Self-harm behaviors  Demographic Factors:  Male  Loss Factors: Decrease in vocational status  Historical Factors: Impulsivity  Risk Reduction Factors:   Positive social support, Positive therapeutic relationship, and Positive coping skills or problem solving skills  Continued Clinical Symptoms:  Depression:   Comorbid alcohol  abuse/dependence  Cognitive Features That Contribute To Risk:  None    Suicide Risk:  Minimal: No identifiable suicidal ideation.  Patients presenting with no risk factors but with morbid ruminations; may be classified as minimal risk based on the severity of the depressive symptoms   Follow-up Information     [facility] Follow up.   Contact information: [address] [place] [address] [phone]         [facility] Follow up.   Specialty: Addiction Medicine Contact information: [address] [place] [address] [phone]         [facility] Follow up.   Contact information: [address] [place], [place] [address]  Phone: [phone] Fax: [phone]                Plan Of Care/Follow-up recommendations:  Activity:  As tolerated  Allyn Foil, MD 05/31/2024, 11:48 AM

## 2024-06-03 ENCOUNTER — Encounter (HOSPITAL_COMMUNITY): Payer: Self-pay

## 2024-06-14 ENCOUNTER — Emergency Department (HOSPITAL_COMMUNITY)

## 2024-06-14 ENCOUNTER — Emergency Department (HOSPITAL_COMMUNITY)
Admission: EM | Admit: 2024-06-14 | Discharge: 2024-06-15 | Disposition: A | Discharge status: Home / Self Care | Attending: Emergency Medicine | Admitting: Emergency Medicine

## 2024-06-14 ENCOUNTER — Encounter (HOSPITAL_COMMUNITY): Payer: Self-pay | Admitting: Emergency Medicine

## 2024-06-14 DIAGNOSIS — Z5902 Unsheltered homelessness: Secondary | ICD-10-CM | POA: Insufficient documentation

## 2024-06-14 DIAGNOSIS — Z7901 Long term (current) use of anticoagulants: Secondary | ICD-10-CM | POA: Insufficient documentation

## 2024-06-14 DIAGNOSIS — F10120 Alcohol abuse with intoxication, uncomplicated: Secondary | ICD-10-CM | POA: Insufficient documentation

## 2024-06-14 DIAGNOSIS — Z79899 Other long term (current) drug therapy: Secondary | ICD-10-CM | POA: Diagnosis not present

## 2024-06-14 DIAGNOSIS — Y906 Blood alcohol level of 120-199 mg/100 ml: Secondary | ICD-10-CM | POA: Diagnosis not present

## 2024-06-14 DIAGNOSIS — F10920 Alcohol use, unspecified with intoxication, uncomplicated: Secondary | ICD-10-CM

## 2024-06-14 DIAGNOSIS — R45851 Suicidal ideations: Secondary | ICD-10-CM | POA: Diagnosis present

## 2024-06-14 DIAGNOSIS — R600 Localized edema: Secondary | ICD-10-CM | POA: Diagnosis present

## 2024-06-14 DIAGNOSIS — R0602 Shortness of breath: Secondary | ICD-10-CM | POA: Diagnosis present

## 2024-06-14 LAB — COMPREHENSIVE METABOLIC PANEL WITH GFR
ALT: 19 U/L (ref 0–44)
AST: 28 U/L (ref 15–41)
Albumin: 4.1 g/dL (ref 3.5–5.0)
Alkaline Phosphatase: 93 U/L (ref 38–126)
Anion gap: 16 — ABNORMAL HIGH (ref 5–15)
BUN: <5 mg/dL — ABNORMAL LOW (ref 8–23)
CO2: 19 mmol/L — ABNORMAL LOW (ref 22–32)
Calcium: 9.1 mg/dL (ref 8.9–10.3)
Chloride: 104 mmol/L (ref 98–111)
Creatinine, Ser: 0.62 mg/dL (ref 0.61–1.24)
GFR, Estimated: >60 mL/min (ref >60)
Glucose, Bld: 87 mg/dL (ref 70–99)
Potassium: 3.5 mmol/L (ref 3.5–5.1)
Sodium: 139 mmol/L (ref 135–145)
Total Bilirubin: 1.6 mg/dL — ABNORMAL HIGH (ref 0.0–1.2)
Total Protein: 7.8 g/dL (ref 6.5–8.1)

## 2024-06-14 LAB — CBC
HCT: 44.9 % (ref 39.0–52.0)
Hemoglobin: 14.4 g/dL (ref 13.0–17.0)
MCH: 27 pg (ref 26.0–34.0)
MCHC: 32.1 g/dL (ref 30.0–36.0)
MCV: 84.1 fL (ref 80.0–100.0)
Platelets: 116 K/uL — ABNORMAL LOW (ref 150–400)
RBC: 5.34 MIL/uL (ref 4.22–5.81)
RDW: 19.7 % — ABNORMAL HIGH (ref 11.5–15.5)
WBC: 6.9 K/uL (ref 4.0–10.5)
nRBC: 0 % (ref 0.0–0.2)

## 2024-06-14 LAB — URINE DRUG SCREEN
Amphetamines: NEGATIVE
Barbiturates: NEGATIVE
Benzodiazepines: NEGATIVE
Cocaine: NEGATIVE
Fentanyl: NEGATIVE
Methadone Scn, Ur: NEGATIVE
Opiates: NEGATIVE
Tetrahydrocannabinol: NEGATIVE

## 2024-06-14 LAB — ETHANOL: Alcohol, Ethyl (B): 179 mg/dL — ABNORMAL HIGH (ref <15)
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
## 2024-06-14 NOTE — Discharge Instructions (Signed)
Return for any problems

## 2024-07-19 ENCOUNTER — Emergency Department (HOSPITAL_COMMUNITY)
Admission: EM | Admit: 2024-07-19 | Discharge: 2024-07-19 | Discharge status: Against Medical Advice | Attending: Emergency Medicine | Admitting: Emergency Medicine

## 2024-07-19 DIAGNOSIS — Z5321 Procedure and treatment not carried out due to patient leaving prior to being seen by health care provider: Secondary | ICD-10-CM | POA: Diagnosis not present

## 2024-07-19 DIAGNOSIS — W57XXXA Bitten or stung by nonvenomous insect and other nonvenomous arthropods, initial encounter: Secondary | ICD-10-CM | POA: Diagnosis not present

## 2024-07-19 DIAGNOSIS — S60561A Insect bite (nonvenomous) of right hand, initial encounter: Secondary | ICD-10-CM | POA: Insufficient documentation

## 2024-07-19 NOTE — ED Notes (Signed)
 Pt walked up to EMT desk and pt said a man had been waiting for 6 hours and pt stated they weren't waiting that long and said he was checking out. Pt taken OTF.

## 2024-07-19 NOTE — ED Triage Notes (Signed)
 Patient reports bite on right hand, went to PCP 2 days ago and got a shot but states it is not getting better and it is numb. Patient does not appear swollen at this time, but patient reports it is numb.

## 2024-08-05 ENCOUNTER — Observation Stay (HOSPITAL_COMMUNITY)
Admission: EM | Admit: 2024-08-05 | Discharge: 2024-08-06 | Disposition: A | Discharge status: Home / Self Care | Attending: Family Medicine | Admitting: Family Medicine

## 2024-08-05 ENCOUNTER — Emergency Department (HOSPITAL_COMMUNITY)

## 2024-08-05 ENCOUNTER — Other Ambulatory Visit: Payer: Self-pay

## 2024-08-05 ENCOUNTER — Encounter (HOSPITAL_COMMUNITY): Payer: Self-pay | Admitting: Family Medicine

## 2024-08-05 DIAGNOSIS — E876 Hypokalemia: Secondary | ICD-10-CM | POA: Insufficient documentation

## 2024-08-05 DIAGNOSIS — I4811 Longstanding persistent atrial fibrillation: Principal | ICD-10-CM

## 2024-08-05 DIAGNOSIS — I10 Essential (primary) hypertension: Secondary | ICD-10-CM | POA: Diagnosis present

## 2024-08-05 DIAGNOSIS — F1994 Other psychoactive substance use, unspecified with psychoactive substance-induced mood disorder: Secondary | ICD-10-CM | POA: Diagnosis not present

## 2024-08-05 DIAGNOSIS — I4891 Unspecified atrial fibrillation: Secondary | ICD-10-CM | POA: Diagnosis not present

## 2024-08-05 DIAGNOSIS — F1014 Alcohol abuse with alcohol-induced mood disorder: Secondary | ICD-10-CM | POA: Diagnosis not present

## 2024-08-05 DIAGNOSIS — I5022 Chronic systolic (congestive) heart failure: Secondary | ICD-10-CM | POA: Diagnosis not present

## 2024-08-05 DIAGNOSIS — F10129 Alcohol abuse with intoxication, unspecified: Secondary | ICD-10-CM | POA: Diagnosis not present

## 2024-08-05 DIAGNOSIS — I110 Hypertensive heart disease with heart failure: Secondary | ICD-10-CM | POA: Insufficient documentation

## 2024-08-05 DIAGNOSIS — E66811 Obesity, class 1: Secondary | ICD-10-CM | POA: Diagnosis not present

## 2024-08-05 DIAGNOSIS — R0602 Shortness of breath: Secondary | ICD-10-CM | POA: Diagnosis present

## 2024-08-05 DIAGNOSIS — Z79899 Other long term (current) drug therapy: Secondary | ICD-10-CM | POA: Diagnosis not present

## 2024-08-05 DIAGNOSIS — Z6833 Body mass index (BMI) 33.0-33.9, adult: Secondary | ICD-10-CM | POA: Diagnosis not present

## 2024-08-05 DIAGNOSIS — F10939 Alcohol use, unspecified with withdrawal, unspecified: Secondary | ICD-10-CM

## 2024-08-05 DIAGNOSIS — T63301A Toxic effect of unspecified spider venom, accidental (unintentional), initial encounter: Secondary | ICD-10-CM | POA: Diagnosis not present

## 2024-08-05 DIAGNOSIS — K7030 Alcoholic cirrhosis of liver without ascites: Secondary | ICD-10-CM | POA: Diagnosis not present

## 2024-08-05 DIAGNOSIS — F14920 Cocaine use, unspecified with intoxication, uncomplicated: Secondary | ICD-10-CM | POA: Insufficient documentation

## 2024-08-05 LAB — PROTIME-INR
INR: 1.1 (ref 0.8–1.2)
Prothrombin Time: 14.9 s (ref 11.4–15.2)

## 2024-08-05 LAB — RAPID URINE DRUG SCREEN, HOSP PERFORMED
Amphetamines: NOT DETECTED
Barbiturates: NOT DETECTED
Benzodiazepines: NOT DETECTED
Cocaine: NOT DETECTED
Opiates: NOT DETECTED
Tetrahydrocannabinol: NOT DETECTED

## 2024-08-05 LAB — CBC
HCT: 50.7 % (ref 39.0–52.0)
Hemoglobin: 17.1 g/dL — ABNORMAL HIGH (ref 13.0–17.0)
MCH: 28.4 pg (ref 26.0–34.0)
MCHC: 33.7 g/dL (ref 30.0–36.0)
MCV: 84.1 fL (ref 80.0–100.0)
Platelets: 159 K/uL (ref 150–400)
RBC: 6.03 MIL/uL — ABNORMAL HIGH (ref 4.22–5.81)
RDW: 16.2 % — ABNORMAL HIGH (ref 11.5–15.5)
WBC: 8.8 K/uL (ref 4.0–10.5)
nRBC: 0 % (ref 0.0–0.2)

## 2024-08-05 LAB — COMPREHENSIVE METABOLIC PANEL WITH GFR
ALT: 35 U/L (ref 0–44)
AST: 48 U/L — ABNORMAL HIGH (ref 15–41)
Albumin: 3.1 g/dL — ABNORMAL LOW (ref 3.5–5.0)
Alkaline Phosphatase: 85 U/L (ref 38–126)
Anion gap: 14 (ref 5–15)
BUN: 8 mg/dL (ref 8–23)
CO2: 20 mmol/L — ABNORMAL LOW (ref 22–32)
Calcium: 8.7 mg/dL — ABNORMAL LOW (ref 8.9–10.3)
Chloride: 101 mmol/L (ref 98–111)
Creatinine, Ser: 0.86 mg/dL (ref 0.61–1.24)
GFR, Estimated: >60 mL/min (ref >60)
Glucose, Bld: 87 mg/dL (ref 70–99)
Potassium: 3.3 mmol/L — ABNORMAL LOW (ref 3.5–5.1)
Sodium: 135 mmol/L (ref 135–145)
Total Bilirubin: 2.1 mg/dL — ABNORMAL HIGH (ref 0.0–1.2)
Total Protein: 6.8 g/dL (ref 6.5–8.1)

## 2024-08-05 LAB — TROPONIN I (HIGH SENSITIVITY)
Troponin I (High Sensitivity): 6 ng/L (ref <18)
Troponin I (High Sensitivity): 6 ng/L (ref <18)

## 2024-08-05 LAB — APTT: aPTT: 26 s (ref 24–36)

## 2024-08-05 LAB — BRAIN NATRIURETIC PEPTIDE: B Natriuretic Peptide: 223.5 pg/mL — ABNORMAL HIGH (ref 0.0–100.0)

## 2024-08-05 LAB — MAGNESIUM: Magnesium: 1.7 mg/dL (ref 1.7–2.4)

## 2024-08-05 LAB — ETHANOL: Alcohol, Ethyl (B): 109 mg/dL — ABNORMAL HIGH (ref <15)

## 2024-08-05 LAB — LIPASE, BLOOD: Lipase: 30 U/L (ref 11–51)

## 2024-08-05 MED ORDER — THIAMINE HCL 100 MG/ML IJ SOLN: 100.0000 mg | Freq: Every day | INTRAMUSCULAR | Status: DC

## 2024-08-05 MED ORDER — METOPROLOL TARTRATE 50 MG PO TABS: 75.0000 mg | ORAL_TABLET | Freq: Two times a day (BID) | ORAL | Status: DC

## 2024-08-05 MED ORDER — DILTIAZEM HCL-DEXTROSE 125-5 MG/125ML-% IV SOLN (PREMIX)
5.0000 mg/h | INTRAVENOUS | Status: DC
  Administered 2024-08-05: 15 mg/h via INTRAVENOUS
  Administered 2024-08-05: 5 mg/h via INTRAVENOUS
  Administered 2024-08-06: 15 mg/h via INTRAVENOUS
  Filled 2024-08-05 (×3): qty 125

## 2024-08-05 MED ORDER — AMIODARONE HCL IN DEXTROSE 360-4.14 MG/200ML-% IV SOLN: 30.0000 mg/h | INTRAVENOUS | Status: DC

## 2024-08-05 MED ORDER — TRAMADOL HCL 50 MG PO TABS
50.0000 mg | ORAL_TABLET | Freq: Two times a day (BID) | ORAL | Status: DC | PRN
  Administered 2024-08-05: 50 mg via ORAL
  Filled 2024-08-05: qty 1

## 2024-08-05 MED ORDER — AMIODARONE HCL IN DEXTROSE 360-4.14 MG/200ML-% IV SOLN
60.0000 mg/h | INTRAVENOUS | Status: DC
  Administered 2024-08-05: 60 mg/h via INTRAVENOUS
  Filled 2024-08-05: qty 200

## 2024-08-05 MED ORDER — FOLIC ACID 1 MG PO TABS
1.0000 mg | ORAL_TABLET | Freq: Every day | ORAL | Status: DC
  Administered 2024-08-05 – 2024-08-06 (×2): 1 mg via ORAL
  Filled 2024-08-05 (×2): qty 1

## 2024-08-05 MED ORDER — MAGNESIUM SULFATE 2 GM/50ML IV SOLN
2.0000 g | Freq: Once | INTRAVENOUS | Status: AC
  Administered 2024-08-05: 2 g via INTRAVENOUS
  Filled 2024-08-05: qty 50

## 2024-08-05 MED ORDER — ACETAMINOPHEN 325 MG PO TABS: 650.0000 mg | ORAL_TABLET | Freq: Four times a day (QID) | ORAL | Status: DC | PRN

## 2024-08-05 MED ORDER — ENOXAPARIN SODIUM 40 MG/0.4ML IJ SOSY
40.0000 mg | PREFILLED_SYRINGE | INTRAMUSCULAR | Status: DC
  Administered 2024-08-05: 40 mg via SUBCUTANEOUS
  Filled 2024-08-05: qty 0.4

## 2024-08-05 MED ORDER — METOPROLOL TARTRATE 25 MG PO TABS
25.0000 mg | ORAL_TABLET | Freq: Once | ORAL | Status: AC
  Administered 2024-08-05: 25 mg via ORAL
  Filled 2024-08-05: qty 1

## 2024-08-05 MED ORDER — LORAZEPAM 2 MG/ML IJ SOLN
1.0000 mg | INTRAMUSCULAR | Status: DC | PRN
  Administered 2024-08-05: 1 mg via INTRAVENOUS
  Filled 2024-08-05: qty 1

## 2024-08-05 MED ORDER — ADULT MULTIVITAMIN W/MINERALS CH
1.0000 | ORAL_TABLET | Freq: Every day | ORAL | Status: DC
Start: 2024-08-06 — End: 2024-08-06
  Administered 2024-08-06: 1 via ORAL
  Filled 2024-08-05: qty 1

## 2024-08-05 MED ORDER — ONDANSETRON HCL 4 MG/2ML IJ SOLN
4.0000 mg | Freq: Once | INTRAMUSCULAR | Status: AC
  Administered 2024-08-05: 4 mg via INTRAVENOUS
  Filled 2024-08-05: qty 2

## 2024-08-05 MED ORDER — ACETAMINOPHEN 650 MG RE SUPP: 650.0000 mg | Freq: Four times a day (QID) | RECTAL | Status: DC | PRN

## 2024-08-05 MED ORDER — THIAMINE MONONITRATE 100 MG PO TABS
100.0000 mg | ORAL_TABLET | Freq: Every day | ORAL | Status: DC
  Administered 2024-08-05 – 2024-08-06 (×2): 100 mg via ORAL
  Filled 2024-08-05 (×2): qty 1

## 2024-08-05 MED ORDER — ADULT MULTIVITAMIN W/MINERALS CH
1.0000 | ORAL_TABLET | Freq: Every day | ORAL | Status: DC
  Administered 2024-08-05: 1 via ORAL
  Filled 2024-08-05: qty 1

## 2024-08-05 MED ORDER — GABAPENTIN 300 MG PO CAPS
300.0000 mg | ORAL_CAPSULE | Freq: Three times a day (TID) | ORAL | Status: DC
  Administered 2024-08-05 – 2024-08-06 (×3): 300 mg via ORAL
  Filled 2024-08-05 (×3): qty 1

## 2024-08-05 MED ORDER — MAGNESIUM OXIDE -MG SUPPLEMENT 400 (240 MG) MG PO TABS
400.0000 mg | ORAL_TABLET | Freq: Every day | ORAL | Status: DC
  Administered 2024-08-05 – 2024-08-06 (×2): 400 mg via ORAL
  Filled 2024-08-05 (×2): qty 1

## 2024-08-05 MED ORDER — HYDROMORPHONE HCL 1 MG/ML IJ SOLN
0.5000 mg | Freq: Once | INTRAMUSCULAR | Status: AC
  Administered 2024-08-05: 0.5 mg via INTRAVENOUS
  Filled 2024-08-05: qty 1

## 2024-08-05 MED ORDER — POTASSIUM CHLORIDE 10 MEQ/100ML IV SOLN
10.0000 meq | INTRAVENOUS | Status: AC
  Administered 2024-08-05 (×2): 10 meq via INTRAVENOUS
  Filled 2024-08-05 (×2): qty 100

## 2024-08-05 MED ORDER — POTASSIUM CHLORIDE CRYS ER 20 MEQ PO TBCR
40.0000 meq | EXTENDED_RELEASE_TABLET | Freq: Once | ORAL | Status: AC
  Administered 2024-08-05: 40 meq via ORAL
  Filled 2024-08-05: qty 2

## 2024-08-05 MED ORDER — METOPROLOL TARTRATE 25 MG PO TABS
50.0000 mg | ORAL_TABLET | Freq: Two times a day (BID) | ORAL | Status: DC
  Administered 2024-08-05: 50 mg via ORAL
  Filled 2024-08-05: qty 2

## 2024-08-05 MED ORDER — ONDANSETRON HCL 4 MG PO TABS: 4.0000 mg | ORAL_TABLET | Freq: Four times a day (QID) | ORAL | Status: DC | PRN

## 2024-08-05 MED ORDER — ONDANSETRON HCL 4 MG/2ML IJ SOLN
4.0000 mg | Freq: Four times a day (QID) | INTRAMUSCULAR | Status: DC | PRN
  Administered 2024-08-05: 4 mg via INTRAVENOUS
  Filled 2024-08-05: qty 2

## 2024-08-05 MED ORDER — LORAZEPAM 1 MG PO TABS: 1.0000 mg | ORAL_TABLET | ORAL | Status: DC | PRN

## 2024-08-05 MED ORDER — BACLOFEN 5 MG HALF TABLET
5.0000 mg | ORAL_TABLET | Freq: Three times a day (TID) | ORAL | Status: DC
Start: 2024-08-05 — End: 2024-08-06
  Administered 2024-08-06 (×2): 5 mg via ORAL
  Filled 2024-08-05 (×4): qty 1

## 2024-08-05 MED ORDER — DILTIAZEM LOAD VIA INFUSION
15.0000 mg | Freq: Once | INTRAVENOUS | Status: AC
  Administered 2024-08-05: 15 mg via INTRAVENOUS
  Filled 2024-08-05: qty 15

## 2024-08-05 MED ORDER — ROSUVASTATIN CALCIUM 20 MG PO TABS
20.0000 mg | ORAL_TABLET | Freq: Every day | ORAL | Status: DC
  Administered 2024-08-05 – 2024-08-06 (×2): 20 mg via ORAL
  Filled 2024-08-05 (×2): qty 1

## 2024-08-05 NOTE — Assessment & Plan Note (Signed)
 Presented from his doctor's office with EtOH 109 mg/dL at 88:73JF.   - CIWA - PRN Ativan  - Thiamine  and folate
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
## 2024-08-05 NOTE — ED Notes (Signed)
 This RN called to give report on the patient and the nurse was busy in another patient's room and unable to take report. This RN called again and left number for the nurse to return my call to get report about the patient.

## 2024-08-05 NOTE — Assessment & Plan Note (Signed)
 With chest discomfort and palpitations but no CHF, no ischemia.  No ST changes on ECG and troponin normal.  CXR clear. - Start metoprolol  - Amiodarone  contraindicated in liver disease - Continue diltiazem  for now, would benefit from repeat EF eval, but probably not this hospitalization - Not on [facility], will defer for now given low health literacy, ongoing EtOH

## 2024-08-05 NOTE — Assessment & Plan Note (Signed)
 EF 35-40% in Dec 2024.  Saw [facility] in [place] last May when he was in rehab, documented on losartan , spironolactone , furosemide  80, Eliquis  and metoprolol  at that time.  He's never heard of any of those medicines, doesn't have any expectation of seeing Cardiology any time soon.   - Needs to establish with Cardiology - Alcohol  cessation recommended first

## 2024-08-05 NOTE — Assessment & Plan Note (Signed)
 BMI 33, complicates care

## 2024-08-05 NOTE — Assessment & Plan Note (Signed)
-   Supp K >4 - Supp Mag >2

## 2024-08-05 NOTE — Assessment & Plan Note (Signed)
 BP normal - Hold amlodipine  to make room for rate control

## 2024-08-05 NOTE — ED Provider Notes (Signed)
 [place] EMERGENCY DEPARTMENT AT [facility] Provider Note   CSN: [id number] Arrival date & time: 08/05/24  1120     Patient presents with: Shortness of Breath   [name] is a 68 y.o. male.  {Add pertinent medical, surgical, social history, OB history to HPI:5718} 68 year old male history of homelessness, atrial fibrillation not currently on anticoagulation, alcoholic cirrhosis, and CHF who presents to the emergency department with chest discomfort.  For the past week and a half has been having chest discomfort.  Feels like someone sitting on his chest.  Also gets winded when he walks around.  Is been having palpitations.  Minimal leg swelling bilaterally.  Went to his outpatient doctor who called 911.  Was found to be in A-fib with RVR.  Attempted access but was unsuccessful and is brought in.  Says he drinks 540 ounce beers per day.  Says that his last drink was at approximately 9 AM this morning       Prior to Admission medications   Medication Sig Start Date End Date Taking? Authorizing Provider  apixaban  (ELIQUIS ) 5 MG TABS tablet Take 1 tablet (5 mg total) by mouth 2 (two) times daily. 10/21/23 11/20/23  Vernon Ranks, MD  artificial tears ophthalmic solution Place 1 drop into both eyes as needed for dry eyes. 05/31/24   Donnelly Mellow, MD  Baclofen  5 MG TABS Take 1 tablet (5 mg total) by mouth 3 (three) times daily. 05/31/24   Jadapalle, Sree, MD  cephALEXin  (KEFLEX ) 500 MG capsule Take 1 capsule (500 mg total) by mouth 2 (two) times daily. 04/12/24   Vicky Charleston, PA-C  cyanocobalamin  100 MCG tablet Take 1 tablet (100 mcg total) by mouth daily. 10/21/23   Vernon Ranks, MD  docusate sodium  (COLACE) 100 MG capsule Take 1 capsule (100 mg total) by mouth 2 (two) times daily. 05/31/24   Donnelly Mellow, MD  doxepin  (SINEQUAN ) 50 MG capsule Take 1 capsule (50 mg total) by mouth at bedtime. 05/31/24   Donnelly Mellow, MD  Fe Fum-Vit C-Vit B12-FA (TRIGELS-F FORTE) CAPS  capsule Take 1 capsule by mouth 2 (two) times daily. 05/31/24   Donnelly Mellow, MD  ferrous sulfate  325 (65 FE) MG tablet Take 1 tablet (325 mg total) by mouth daily with breakfast. 10/21/23   Vernon Ranks, MD  folic acid  (FOLVITE ) 1 MG tablet Take 1 tablet (1 mg total) by mouth daily. 10/21/23   Vernon Ranks, MD  folic acid  (FOLVITE ) 1 MG tablet Take 1 mg by mouth daily. Patient not taking: Reported on 05/01/2024 01/24/24   [provider]  furosemide  (LASIX ) 40 MG tablet Take 1 tablet (40 mg total) by mouth daily. 10/21/23   Vernon Ranks, MD  furosemide  (LASIX ) 80 MG tablet Take 1 tablet (80 mg total) by mouth daily. 05/31/24   Jadapalle, Sree, MD  gabapentin  (NEURONTIN ) 300 MG capsule Take 1 capsule (300 mg total) by mouth 3 (three) times daily. 10/21/23   Vernon Ranks, MD  gabapentin  (NEURONTIN ) 300 MG capsule Take 1 capsule (300 mg total) by mouth 3 (three) times daily. 05/31/24   Donnelly Mellow, MD  magnesium  oxide (MAG-OX) 400 (240 Mg) MG tablet Take 1 tablet (400 mg total) by mouth daily. 10/21/23   Pahwani, Ravi, MD  metoprolol  tartrate (LOPRESSOR ) 25 MG tablet Take 3 tablets (75 mg total) by mouth 2 (two) times daily. 10/21/23   Vernon Ranks, MD  mirtazapine  (REMERON ) 15 MG tablet Take 1 tablet (15 mg total) by mouth at bedtime. 10/21/23  11/20/23  Vernon Ranks, MD  mirtazapine  (REMERON ) 15 MG tablet Take 1 tablet (15 mg total) by mouth at bedtime. 05/31/24   Jadapalle, Sree, MD  Multiple Vitamin (MULTIVITAMIN WITH MINERALS) TABS tablet Take 1 tablet by mouth daily. 06/01/24   Jadapalle, Sree, MD  naproxen  (NAPROSYN ) 500 MG tablet Take 1 tablet (500 mg total) by mouth 3 (three) times daily with meals. 05/31/24   Donnelly Mellow, MD  nystatin  cream (MYCOSTATIN ) Apply topically 2 (two) times daily as needed for dry skin. 05/02/24   Maczis, Michael M, PA-C  oxyCODONE  (OXY IR/ROXICODONE ) 5 MG immediate release tablet Take 1-2 tablets (5-10 mg total) by mouth every 8 (eight) hours as needed (5mg  for  moderate pain, 10mg  for severe pain). 05/31/24   Donnelly Mellow, MD  pantoprazole  (PROTONIX ) 40 MG tablet Take 1 tablet (40 mg total) by mouth daily. 06/01/24   Jadapalle, Sree, MD  QUEtiapine  (SEROQUEL ) 200 MG tablet Take 1 tablet (200 mg total) by mouth at bedtime. 05/31/24   Jadapalle, Sree, MD  QUEtiapine  (SEROQUEL ) 25 MG tablet Take 1 tablet (25 mg total) by mouth at bedtime. 10/21/23   Vernon Ranks, MD  rosuvastatin  (CRESTOR ) 20 MG tablet Take 1 tablet (20 mg total) by mouth daily. 10/21/23 11/20/23  Vernon Ranks, MD  rosuvastatin  (CRESTOR ) 20 MG tablet Take 1 tablet (20 mg total) by mouth daily. 06/01/24   Jadapalle, Sree, MD  sertraline  (ZOLOFT ) 100 MG tablet Take 1 tablet (100 mg total) by mouth daily. 06/01/24   Jadapalle, Sree, MD  tamsulosin  (FLOMAX ) 0.4 MG CAPS capsule Take 1 capsule (0.4 mg total) by mouth daily. 06/01/24   Jadapalle, Sree, MD  thiamine  (VITAMIN B-1) 100 MG tablet Take 1 tablet (100 mg total) by mouth daily. 05/03/24   Maczis, Michael M, PA-C  thiamine  (VITAMIN B1) 100 MG tablet Take 1 tablet (100 mg total) by mouth daily. 10/21/23   Vernon Ranks, MD    Allergies: Carrot [daucus carota] and Carrot [daucus carota]    Review of Systems  Updated Vital Signs BP (!) 129/107 (BP Location: Left Arm)   Pulse (!) 56   Temp 97.7 F (36.5 C) (Oral)   Resp 18   Ht 5' 8 (1.727 m)   Wt 99.8 kg   SpO2 99%   BMI 33.45 kg/m   Physical Exam Vitals and nursing note reviewed.  Constitutional:      General: He is not in acute distress.    Appearance: He is well-developed.  HENT:     Head: Normocephalic and atraumatic.     Right Ear: External ear normal.     Left Ear: External ear normal.     Nose: Nose normal.  Eyes:     Extraocular Movements: Extraocular movements intact.     Conjunctiva/sclera: Conjunctivae normal.     Pupils: Pupils are equal, round, and reactive to light.  Cardiovascular:     Rate and Rhythm: Regular rhythm. Tachycardia present.     Heart sounds:  Normal heart sounds.  Pulmonary:     Effort: Pulmonary effort is normal. No respiratory distress.     Breath sounds: Normal breath sounds.  Musculoskeletal:     Cervical back: Normal range of motion and neck supple.     Right lower leg: Edema (Trace) present.     Left lower leg: Edema (Trace) present.  Skin:    General: Skin is warm and dry.  Neurological:     Mental Status: He is alert. Mental status is at baseline.  Psychiatric:  Mood and Affect: Mood normal.        Behavior: Behavior normal.     (all labs ordered are listed, but only abnormal results are displayed) Labs Reviewed  MAGNESIUM   CBC  PROTIME-INR  APTT  BRAIN NATRIURETIC PEPTIDE  COMPREHENSIVE METABOLIC PANEL WITH GFR  LIPASE, BLOOD  ETHANOL  RAPID URINE DRUG SCREEN, HOSP PERFORMED  TROPONIN I (HIGH SENSITIVITY)    EKG: EKG Interpretation Date/Time:  Monday August 05 2024 11:43:54 EDT Ventricular Rate:  140 PR Interval:    QRS Duration:  96 QT Interval:  330 QTC Calculation: 503 R Axis:   75  Text Interpretation: Atrial fibrillation with rapid ventricular response Nonspecific ST and T wave abnormality Abnormal ECG When compared with ECG of 11-Apr-2024 23:57, PREVIOUS ECG IS PRESENT Confirmed by Yolande Charleston [id number]) on 08/05/2024 12:01:24 PM  Radiology: No results found.  {Document cardiac monitor, telemetry assessment procedure when appropriate:32947} .Ultrasound ED Peripheral IV (Provider)  Date/Time: 08/05/2024 12:25 PM  Performed by: Yolande Charleston BROCKS, MD Authorized by: Yolande Charleston BROCKS, MD   Procedure details:    Indications: multiple failed IV attempts and poor IV access     Skin Prep: chlorhexidine  gluconate     Location:  Left AC   Angiocath:  20 G   Bedside Ultrasound Guided: Yes     Images: not archived     Patient tolerated procedure without complications: Yes     Dressing applied: Yes      Medications Ordered in the ED  diltiazem  (CARDIZEM ) 1 mg/mL load via  infusion 15 mg (has no administration in time range)    And  diltiazem  (CARDIZEM ) 125 mg in dextrose  5% 125 mL (1 mg/mL) infusion (has no administration in time range)      {Click here for ABCD2, HEART and other calculators REFRESH Note before signing:1}                              Medical Decision Making Amount and/or Complexity of Data Reviewed Labs: ordered. Radiology: ordered.  Risk Prescription drug management.   ***  {Document critical care time when appropriate  Document review of labs and clinical decision tools ie CHADS2VASC2, etc  Document your independent review of radiology images and any outside records  Document your discussion with family members, caretakers and with consultants  Document social determinants of health affecting pt's care  Document your decision making why or why not admission, treatments were needed:32947:::1}   Final diagnoses:  None    ED Discharge Orders     None
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
## 2024-08-05 NOTE — ED Triage Notes (Addendum)
 Patient arrives via [facility] from pcp for CP and shortness of breath x 1 week. Patient endorses being bit by a spider two weeks ago with associated numbness and pain. Took enture course of abx for spider bite. Afib RVR with EMS. HR 170. 12 lead HR 140s. BP 150/70, 98 on room air. 10/10 CP left side. No meds en route. Alert and oriented x4. No CP radiation. CBG 86. 4 failed peripheral IV start.
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
## 2024-08-05 NOTE — Assessment & Plan Note (Signed)
 Appears compensated. No ascites, INR normal, Platelets normal.

## 2024-08-06 ENCOUNTER — Other Ambulatory Visit (HOSPITAL_COMMUNITY): Payer: Self-pay

## 2024-08-06 DIAGNOSIS — I4891 Unspecified atrial fibrillation: Secondary | ICD-10-CM | POA: Diagnosis not present

## 2024-08-06 LAB — CBC
HCT: 44.9 % (ref 39.0–52.0)
Hemoglobin: 14.7 g/dL (ref 13.0–17.0)
MCH: 27.9 pg (ref 26.0–34.0)
MCHC: 32.7 g/dL (ref 30.0–36.0)
MCV: 85.4 fL (ref 80.0–100.0)
Platelets: 141 K/uL — ABNORMAL LOW (ref 150–400)
RBC: 5.26 MIL/uL (ref 4.22–5.81)
RDW: 16.5 % — ABNORMAL HIGH (ref 11.5–15.5)
WBC: 7.7 K/uL (ref 4.0–10.5)
nRBC: 0 % (ref 0.0–0.2)

## 2024-08-06 LAB — MAGNESIUM: Magnesium: 2 mg/dL (ref 1.7–2.4)

## 2024-08-06 LAB — BASIC METABOLIC PANEL WITH GFR
Anion gap: 9 (ref 5–15)
BUN: 14 mg/dL (ref 8–23)
CO2: 21 mmol/L — ABNORMAL LOW (ref 22–32)
Calcium: 8.5 mg/dL — ABNORMAL LOW (ref 8.9–10.3)
Chloride: 102 mmol/L (ref 98–111)
Creatinine, Ser: 0.93 mg/dL (ref 0.61–1.24)
GFR, Estimated: >60 mL/min (ref >60)
Glucose, Bld: 110 mg/dL — ABNORMAL HIGH (ref 70–99)
Potassium: 4.3 mmol/L (ref 3.5–5.1)
Sodium: 132 mmol/L — ABNORMAL LOW (ref 135–145)

## 2024-08-06 MED ORDER — METOPROLOL TARTRATE 100 MG PO TABS
100.0000 mg | ORAL_TABLET | Freq: Two times a day (BID) | ORAL | Status: DC
  Administered 2024-08-06: 100 mg via ORAL
  Filled 2024-08-06: qty 1

## 2024-08-06 MED ORDER — DILTIAZEM HCL 30 MG PO TABS
30.0000 mg | ORAL_TABLET | Freq: Three times a day (TID) | ORAL | Status: DC
  Administered 2024-08-06 (×2): 30 mg via ORAL
  Filled 2024-08-06 (×2): qty 1

## 2024-08-06 MED ORDER — METOPROLOL TARTRATE 100 MG PO TABS
100.0000 mg | ORAL_TABLET | Freq: Two times a day (BID) | ORAL | 0 refills | Status: DC
  Filled 2024-08-06: qty 180, 90d supply, fill #0

## 2024-08-06 NOTE — TOC CM/SW Note (Signed)
 Transition of Care (TOC) CM/SW Note   Per MD, patient agreeable to giving check to an ALF. CSW provided patient with list of ALFs, bus passes, and agencies ([facility], [employer]) that can help assist.   Luise Pan, MSW, LCSWA Transitions of Care [phone]

## 2024-08-06 NOTE — Care Management Obs Status (Signed)
 MEDICARE OBSERVATION STATUS NOTIFICATION   Patient Details  Name: [name] MRN: [id number] Date of Birth: Mar 06, 1956   Medicare Observation Status Notification Given:  Yes    Vonzell Arrie Sharps 08/06/2024, 8:34 AM
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
## 2024-08-06 NOTE — Plan of Care (Signed)

## 2024-08-06 NOTE — TOC CM/SW Note (Signed)
 Transition of Care [facility]) - Inpatient Brief Assessment   Patient Details  Name: [name] MRN: [id number] Date of Birth: January 21, 1956  [facility] of [facility] CM/SW Contact:    Waddell Barnie Rama, RN Phone Number: 08/06/2024, 4:50 PM   Clinical Narrative: Homeless, has PCP  Rojelio Daring and he has an apt on 11/4 and insurance on file, states has no HH services in place at this time or DME at home.  States he will need a cab or bus pass he has no family support system, states gets medications from [facility] on [address].  Pta self ambulatory.      Transition of Care Asessment: Insurance and Status: Insurance coverage has been reviewed Patient has primary care physician: Yes Bonna Daring) Home environment has been reviewed: Homeless (tent) Prior level of function:: indep Prior/Current Home Services: No current home services Social Drivers of Health Review: SDOH reviewed interventions complete Readmission risk has been reviewed: Yes Transition of care needs: transition of care needs identified, TOC will continue to follow

## 2024-08-06 NOTE — TOC Transition Note (Signed)
 Transition of Care [facility]) - Discharge Note   Patient Details  Name: [name] MRN: [id number] Date of Birth: 1956-06-07  Transition of Care [facility]) CM/SW Contact:  Waddell Barnie Rama, RN Phone Number: 08/06/2024, 4:53 PM   Clinical Narrative:    For dc today, CSW gave patient resources fro ALF  and bus passes.          Patient Goals and CMS Choice            Discharge Placement                       Discharge Plan and Services Additional resources added to the After Visit Summary for                                       Social Drivers of Health (SDOH) Interventions SDOH Screenings   Food Insecurity: Food Insecurity Present (08/06/2024)  Housing: High Risk (08/06/2024)  Transportation Needs: Unmet Transportation Needs (08/05/2024)  Utilities: Patient Declined (08/06/2024)  Alcohol  Screen: High Risk (05/03/2024)  Depression (PHQ2-9): Medium Risk (07/01/2023)  Social Connections: Socially Isolated (08/05/2024)  Tobacco Use: Medium Risk (08/05/2024)     Readmission Risk Interventions    10/13/2023   11:24 AM  Readmission Risk Prevention Plan  Transportation Screening Complete  Medication Review (RN Care Manager) Complete  HRI or Home Care Consult Not Complete  HRI or Home Care Consult Pt Refusal Comments Patient is homeless  SW Recovery Care/Counseling Consult Complete  Palliative Care Screening Not Applicable  Skilled Nursing Facility Not Applicable

## 2024-08-07 ENCOUNTER — Other Ambulatory Visit (HOSPITAL_COMMUNITY): Payer: Self-pay
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
## 2024-08-16 ENCOUNTER — Other Ambulatory Visit: Payer: Self-pay

## 2024-08-16 ENCOUNTER — Inpatient Hospital Stay (HOSPITAL_COMMUNITY)
Admission: EM | Admit: 2024-08-16 | Discharge: 2024-08-27 | DRG: 309 | Disposition: A | Discharge status: Home / Self Care | Attending: Internal Medicine | Admitting: Internal Medicine

## 2024-08-16 ENCOUNTER — Emergency Department (HOSPITAL_COMMUNITY)

## 2024-08-16 ENCOUNTER — Encounter (HOSPITAL_COMMUNITY): Payer: Self-pay

## 2024-08-16 DIAGNOSIS — Z5982 Transportation insecurity: Secondary | ICD-10-CM

## 2024-08-16 DIAGNOSIS — Z91018 Allergy to other foods: Secondary | ICD-10-CM

## 2024-08-16 DIAGNOSIS — K2970 Gastritis, unspecified, without bleeding: Secondary | ICD-10-CM

## 2024-08-16 DIAGNOSIS — F32A Depression, unspecified: Secondary | ICD-10-CM | POA: Diagnosis present

## 2024-08-16 DIAGNOSIS — K269 Duodenal ulcer, unspecified as acute or chronic, without hemorrhage or perforation: Secondary | ICD-10-CM | POA: Diagnosis present

## 2024-08-16 DIAGNOSIS — K2900 Acute gastritis without bleeding: Secondary | ICD-10-CM | POA: Diagnosis present

## 2024-08-16 DIAGNOSIS — Z5941 Food insecurity: Secondary | ICD-10-CM

## 2024-08-16 DIAGNOSIS — Z5948 Other specified lack of adequate food: Secondary | ICD-10-CM

## 2024-08-16 DIAGNOSIS — I4819 Other persistent atrial fibrillation: Principal | ICD-10-CM | POA: Diagnosis present

## 2024-08-16 DIAGNOSIS — K2980 Duodenitis without bleeding: Secondary | ICD-10-CM | POA: Diagnosis present

## 2024-08-16 DIAGNOSIS — Z5900 Homelessness unspecified: Secondary | ICD-10-CM

## 2024-08-16 DIAGNOSIS — F419 Anxiety disorder, unspecified: Secondary | ICD-10-CM | POA: Diagnosis present

## 2024-08-16 DIAGNOSIS — Z6832 Body mass index (BMI) 32.0-32.9, adult: Secondary | ICD-10-CM

## 2024-08-16 DIAGNOSIS — F1010 Alcohol abuse, uncomplicated: Secondary | ICD-10-CM | POA: Diagnosis present

## 2024-08-16 DIAGNOSIS — I340 Nonrheumatic mitral (valve) insufficiency: Secondary | ICD-10-CM | POA: Diagnosis present

## 2024-08-16 DIAGNOSIS — G8929 Other chronic pain: Secondary | ICD-10-CM | POA: Diagnosis present

## 2024-08-16 DIAGNOSIS — Z604 Social exclusion and rejection: Secondary | ICD-10-CM | POA: Diagnosis present

## 2024-08-16 DIAGNOSIS — Y905 Blood alcohol level of 100-119 mg/100 ml: Secondary | ICD-10-CM | POA: Diagnosis present

## 2024-08-16 DIAGNOSIS — Z79899 Other long term (current) drug therapy: Secondary | ICD-10-CM

## 2024-08-16 DIAGNOSIS — D696 Thrombocytopenia, unspecified: Secondary | ICD-10-CM | POA: Diagnosis present

## 2024-08-16 DIAGNOSIS — I110 Hypertensive heart disease with heart failure: Secondary | ICD-10-CM | POA: Diagnosis present

## 2024-08-16 DIAGNOSIS — I4891 Unspecified atrial fibrillation: Secondary | ICD-10-CM | POA: Diagnosis not present

## 2024-08-16 DIAGNOSIS — E8809 Other disorders of plasma-protein metabolism, not elsewhere classified: Secondary | ICD-10-CM | POA: Diagnosis present

## 2024-08-16 DIAGNOSIS — I5022 Chronic systolic (congestive) heart failure: Secondary | ICD-10-CM | POA: Diagnosis present

## 2024-08-16 DIAGNOSIS — F1910 Other psychoactive substance abuse, uncomplicated: Secondary | ICD-10-CM | POA: Insufficient documentation

## 2024-08-16 DIAGNOSIS — E66811 Obesity, class 1: Secondary | ICD-10-CM | POA: Diagnosis present

## 2024-08-16 DIAGNOSIS — D6959 Other secondary thrombocytopenia: Secondary | ICD-10-CM | POA: Diagnosis present

## 2024-08-16 DIAGNOSIS — R519 Headache, unspecified: Secondary | ICD-10-CM | POA: Diagnosis present

## 2024-08-16 DIAGNOSIS — R935 Abnormal findings on diagnostic imaging of other abdominal regions, including retroperitoneum: Secondary | ICD-10-CM

## 2024-08-16 DIAGNOSIS — Z5902 Unsheltered homelessness: Secondary | ICD-10-CM

## 2024-08-16 DIAGNOSIS — I10 Essential (primary) hypertension: Secondary | ICD-10-CM | POA: Diagnosis present

## 2024-08-16 DIAGNOSIS — Z860100 Personal history of colon polyps, unspecified: Secondary | ICD-10-CM

## 2024-08-16 DIAGNOSIS — Z9151 Personal history of suicidal behavior: Secondary | ICD-10-CM

## 2024-08-16 DIAGNOSIS — Z7901 Long term (current) use of anticoagulants: Secondary | ICD-10-CM

## 2024-08-16 DIAGNOSIS — B182 Chronic viral hepatitis C: Secondary | ICD-10-CM | POA: Diagnosis present

## 2024-08-16 DIAGNOSIS — Z9049 Acquired absence of other specified parts of digestive tract: Secondary | ICD-10-CM

## 2024-08-16 DIAGNOSIS — I426 Alcoholic cardiomyopathy: Secondary | ICD-10-CM | POA: Diagnosis present

## 2024-08-16 DIAGNOSIS — K3184 Gastroparesis: Secondary | ICD-10-CM | POA: Diagnosis present

## 2024-08-16 DIAGNOSIS — Z7141 Alcohol abuse counseling and surveillance of alcoholic: Secondary | ICD-10-CM

## 2024-08-16 DIAGNOSIS — K7030 Alcoholic cirrhosis of liver without ascites: Secondary | ICD-10-CM | POA: Diagnosis present

## 2024-08-16 DIAGNOSIS — F1491 Cocaine use, unspecified, in remission: Secondary | ICD-10-CM | POA: Diagnosis present

## 2024-08-16 DIAGNOSIS — G4700 Insomnia, unspecified: Secondary | ICD-10-CM | POA: Diagnosis present

## 2024-08-16 DIAGNOSIS — E785 Hyperlipidemia, unspecified: Secondary | ICD-10-CM | POA: Diagnosis present

## 2024-08-16 DIAGNOSIS — Z91148 Patient's other noncompliance with medication regimen for other reason: Secondary | ICD-10-CM

## 2024-08-16 LAB — CBC
HCT: 49.5 % (ref 39.0–52.0)
Hemoglobin: 16.2 g/dL (ref 13.0–17.0)
MCH: 27.8 pg (ref 26.0–34.0)
MCHC: 32.7 g/dL (ref 30.0–36.0)
MCV: 84.9 fL (ref 80.0–100.0)
Platelets: 153 K/uL (ref 150–400)
RBC: 5.83 MIL/uL — ABNORMAL HIGH (ref 4.22–5.81)
RDW: 15.7 % — ABNORMAL HIGH (ref 11.5–15.5)
WBC: 7.2 K/uL (ref 4.0–10.5)
nRBC: 0 % (ref 0.0–0.2)

## 2024-08-16 LAB — BASIC METABOLIC PANEL WITH GFR
Anion gap: 14 (ref 5–15)
BUN: 5 mg/dL — ABNORMAL LOW (ref 8–23)
CO2: 22 mmol/L (ref 22–32)
Calcium: 8.7 mg/dL — ABNORMAL LOW (ref 8.9–10.3)
Chloride: 102 mmol/L (ref 98–111)
Creatinine, Ser: 0.95 mg/dL (ref 0.61–1.24)
GFR, Estimated: >60 mL/min (ref >60)
Glucose, Bld: 94 mg/dL (ref 70–99)
Potassium: 3.8 mmol/L (ref 3.5–5.1)
Sodium: 138 mmol/L (ref 135–145)

## 2024-08-16 LAB — LIPASE, BLOOD: Lipase: 32 U/L (ref 11–51)

## 2024-08-16 LAB — RAPID URINE DRUG SCREEN, HOSP PERFORMED
Amphetamines: NOT DETECTED
Barbiturates: NOT DETECTED
Benzodiazepines: NOT DETECTED
Cocaine: NOT DETECTED
Opiates: NOT DETECTED
Tetrahydrocannabinol: NOT DETECTED

## 2024-08-16 LAB — TROPONIN I (HIGH SENSITIVITY)
Troponin I (High Sensitivity): 6 ng/L (ref <18)
Troponin I (High Sensitivity): 6 ng/L (ref <18)

## 2024-08-16 LAB — MAGNESIUM: Magnesium: 1.8 mg/dL (ref 1.7–2.4)

## 2024-08-16 LAB — T4, FREE: Free T4: 0.92 ng/dL (ref 0.61–1.12)

## 2024-08-16 LAB — ETHANOL: Alcohol, Ethyl (B): 105 mg/dL — ABNORMAL HIGH (ref <15)

## 2024-08-16 LAB — TSH: TSH: 4.47 u[IU]/mL (ref 0.350–4.500)

## 2024-08-16 MED ORDER — DILTIAZEM LOAD VIA INFUSION
20.0000 mg | Freq: Once | INTRAVENOUS | Status: AC
  Administered 2024-08-16: 20 mg via INTRAVENOUS
  Filled 2024-08-16: qty 20

## 2024-08-16 MED ORDER — IOHEXOL 350 MG/ML SOLN
75.0000 mL | Freq: Once | INTRAVENOUS | Status: AC | PRN
  Administered 2024-08-16: 75 mL via INTRAVENOUS

## 2024-08-16 MED ORDER — HEPARIN (PORCINE) 25000 UT/250ML-% IV SOLN
1500.0000 [IU]/h | INTRAVENOUS | Status: DC
  Administered 2024-08-16: 1300 [IU]/h via INTRAVENOUS
  Administered 2024-08-17 – 2024-08-18 (×2): 1400 [IU]/h via INTRAVENOUS
  Filled 2024-08-16 (×3): qty 250

## 2024-08-16 MED ORDER — HEPARIN BOLUS VIA INFUSION
4000.0000 [IU] | Freq: Once | INTRAVENOUS | Status: AC
Start: 2024-08-16 — End: 2024-08-16
  Administered 2024-08-16: 4000 [IU] via INTRAVENOUS
  Filled 2024-08-16: qty 4000

## 2024-08-16 MED ORDER — DILTIAZEM HCL-DEXTROSE 125-5 MG/125ML-% IV SOLN (PREMIX)
5.0000 mg/h | INTRAVENOUS | Status: DC
  Administered 2024-08-16: 5 mg/h via INTRAVENOUS
  Administered 2024-08-17 – 2024-08-18 (×3): 15 mg/h via INTRAVENOUS
  Filled 2024-08-16 (×5): qty 125

## 2024-08-16 MED ORDER — FENTANYL CITRATE (PF) 50 MCG/ML IJ SOSY
50.0000 ug | PREFILLED_SYRINGE | Freq: Once | INTRAMUSCULAR | Status: AC
  Administered 2024-08-16: 50 ug via INTRAVENOUS
  Filled 2024-08-16: qty 1

## 2024-08-16 MED ORDER — FAMOTIDINE IN NACL 20-0.9 MG/50ML-% IV SOLN
20.0000 mg | Freq: Once | INTRAVENOUS | Status: AC
  Administered 2024-08-16: 20 mg via INTRAVENOUS
  Filled 2024-08-16: qty 50

## 2024-08-16 NOTE — ED Triage Notes (Signed)
 [name] arrived via [facility] [name] was on the bus and started having chest pain in the center and got off the bus and called 911. [name] was alert and talking. [name] states he has afib, [name] given 324 ASA en route vss and on room air. [name] states pain has been going on for 1 hour or 2 he just got DC from hospital last week.
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
## 2024-08-16 NOTE — Progress Notes (Addendum)
 ANTICOAGULATION CONSULT NOTE  Pharmacy Consult for Heparin  Indication: atrial fibrillation  Allergies  Allergen Reactions   Carrot [Daucus Carota] Anaphylaxis, Swelling and Other (See Comments)    Lips swell- had to receive Benadryl     Patient Measurements: Height: 5' 9 (175.3 cm) Weight: 104.3 kg (230 lb) IBW/kg (Calculated) : 70.7 Heparin  Dosing Weight: 93.2 kg  Vital Signs: Temp: 98.2 F (36.8 C) (10/31 1959) Temp Source: Oral (10/31 1959) BP: 133/98 (10/31 1959) Pulse Rate: 111 (10/31 1959)  Labs: No results for input(s): HGB, HCT, PLT, APTT, LABPROT, INR, HEPARINUNFRC, HEPRLOWMOCWT, CREATININE, CKTOTAL, CKMB, TROPONINIHS in the last 72 hours.  Estimated Creatinine Clearance: 89.2 mL/min (by C-G formula based on SCr of 0.93 mg/dL).   Medical History: Past Medical History:  Diagnosis Date   Alcohol  abuse    Anxiety    Atrial fibrillation (HCC)    Cirrhosis (HCC)    Depression    Hep C w/o coma, chronic (HCC)    Hepatitis C    HTN (hypertension)    Hypertension    Liver cirrhosis (HCC)    Pancreatitis    Substance abuse (HCC)    crack cocaine   Suicide attempt (HCC)    Thyroid  disease     Medications:  (Not in a hospital admission)  Scheduled:   diltiazem   20 mg Intravenous Once   Infusions:   diltiazem  (CARDIZEM ) infusion     PRN:   Assessment: 68 yom with a history of homelessness, hepatitis C, HTN, cirrhosis, substance abuse, pancreatitis, HTN, AF not on anticoagulation. Patient is presenting with chest pain. Heparin  per pharmacy consult placed for atrial fibrillation.  Patient is not on anticoagulation prior to arrival.  CBC pending from this encounter but 10/21 Hgb 14.7; plt 141  Goal of Therapy:  Heparin  level 0.3-0.7 units/ml Monitor platelets by anticoagulation protocol: Yes   Plan:  Give IV heparin  4000 units bolus x 1 Start heparin  infusion at 1300 units/hr Check anti-Xa level in 8 hours and daily while  on heparin  Continue to monitor H&H and platelets  Dorn Buttner, PharmD, BCPS 08/16/2024 8:12 PM ED Clinical Pharmacist -  [phone]  **ADDENDUM 08/16/24 9:46 PM** CBC from this encounter has resulted Hgb 16.2; plt 153  Dorn Buttner, PharmD, BCPS 08/16/2024 9:46 PM ED Clinical Pharmacist -  [phone]

## 2024-08-17 ENCOUNTER — Observation Stay (HOSPITAL_COMMUNITY)

## 2024-08-17 DIAGNOSIS — G4700 Insomnia, unspecified: Secondary | ICD-10-CM | POA: Diagnosis present

## 2024-08-17 DIAGNOSIS — Y905 Blood alcohol level of 100-119 mg/100 ml: Secondary | ICD-10-CM | POA: Diagnosis present

## 2024-08-17 DIAGNOSIS — B182 Chronic viral hepatitis C: Secondary | ICD-10-CM | POA: Diagnosis present

## 2024-08-17 DIAGNOSIS — F1491 Cocaine use, unspecified, in remission: Secondary | ICD-10-CM | POA: Diagnosis present

## 2024-08-17 DIAGNOSIS — D6959 Other secondary thrombocytopenia: Secondary | ICD-10-CM | POA: Diagnosis present

## 2024-08-17 DIAGNOSIS — I10 Essential (primary) hypertension: Secondary | ICD-10-CM | POA: Diagnosis not present

## 2024-08-17 DIAGNOSIS — F1090 Alcohol use, unspecified, uncomplicated: Secondary | ICD-10-CM | POA: Diagnosis not present

## 2024-08-17 DIAGNOSIS — R519 Headache, unspecified: Secondary | ICD-10-CM | POA: Diagnosis present

## 2024-08-17 DIAGNOSIS — E66811 Obesity, class 1: Secondary | ICD-10-CM | POA: Diagnosis present

## 2024-08-17 DIAGNOSIS — D696 Thrombocytopenia, unspecified: Secondary | ICD-10-CM | POA: Diagnosis not present

## 2024-08-17 DIAGNOSIS — I110 Hypertensive heart disease with heart failure: Secondary | ICD-10-CM | POA: Diagnosis present

## 2024-08-17 DIAGNOSIS — R935 Abnormal findings on diagnostic imaging of other abdominal regions, including retroperitoneum: Secondary | ICD-10-CM | POA: Diagnosis not present

## 2024-08-17 DIAGNOSIS — K2970 Gastritis, unspecified, without bleeding: Secondary | ICD-10-CM | POA: Diagnosis not present

## 2024-08-17 DIAGNOSIS — Z5902 Unsheltered homelessness: Secondary | ICD-10-CM | POA: Diagnosis not present

## 2024-08-17 DIAGNOSIS — E785 Hyperlipidemia, unspecified: Secondary | ICD-10-CM | POA: Diagnosis present

## 2024-08-17 DIAGNOSIS — F32A Depression, unspecified: Secondary | ICD-10-CM | POA: Diagnosis present

## 2024-08-17 DIAGNOSIS — I4891 Unspecified atrial fibrillation: Secondary | ICD-10-CM

## 2024-08-17 DIAGNOSIS — K7030 Alcoholic cirrhosis of liver without ascites: Secondary | ICD-10-CM | POA: Diagnosis present

## 2024-08-17 DIAGNOSIS — K2980 Duodenitis without bleeding: Secondary | ICD-10-CM | POA: Diagnosis present

## 2024-08-17 DIAGNOSIS — I5022 Chronic systolic (congestive) heart failure: Secondary | ICD-10-CM | POA: Diagnosis present

## 2024-08-17 DIAGNOSIS — I509 Heart failure, unspecified: Secondary | ICD-10-CM | POA: Diagnosis not present

## 2024-08-17 DIAGNOSIS — I4819 Other persistent atrial fibrillation: Secondary | ICD-10-CM | POA: Diagnosis present

## 2024-08-17 DIAGNOSIS — Z6832 Body mass index (BMI) 32.0-32.9, adult: Secondary | ICD-10-CM | POA: Diagnosis not present

## 2024-08-17 DIAGNOSIS — F1010 Alcohol abuse, uncomplicated: Secondary | ICD-10-CM | POA: Diagnosis present

## 2024-08-17 DIAGNOSIS — K269 Duodenal ulcer, unspecified as acute or chronic, without hemorrhage or perforation: Secondary | ICD-10-CM | POA: Diagnosis present

## 2024-08-17 DIAGNOSIS — E8809 Other disorders of plasma-protein metabolism, not elsewhere classified: Secondary | ICD-10-CM | POA: Diagnosis present

## 2024-08-17 DIAGNOSIS — Z7901 Long term (current) use of anticoagulants: Secondary | ICD-10-CM | POA: Diagnosis not present

## 2024-08-17 DIAGNOSIS — I426 Alcoholic cardiomyopathy: Secondary | ICD-10-CM | POA: Diagnosis present

## 2024-08-17 DIAGNOSIS — K2900 Acute gastritis without bleeding: Secondary | ICD-10-CM | POA: Diagnosis present

## 2024-08-17 DIAGNOSIS — F419 Anxiety disorder, unspecified: Secondary | ICD-10-CM | POA: Diagnosis present

## 2024-08-17 DIAGNOSIS — I340 Nonrheumatic mitral (valve) insufficiency: Secondary | ICD-10-CM | POA: Diagnosis present

## 2024-08-17 DIAGNOSIS — G8929 Other chronic pain: Secondary | ICD-10-CM | POA: Diagnosis present

## 2024-08-17 DIAGNOSIS — R1013 Epigastric pain: Secondary | ICD-10-CM | POA: Diagnosis not present

## 2024-08-17 DIAGNOSIS — J449 Chronic obstructive pulmonary disease, unspecified: Secondary | ICD-10-CM | POA: Diagnosis not present

## 2024-08-17 LAB — MRSA NEXT GEN BY PCR, NASAL: MRSA by PCR Next Gen: DETECTED — AB

## 2024-08-17 LAB — COMPREHENSIVE METABOLIC PANEL WITH GFR
ALT: 23 U/L (ref 0–44)
AST: 32 U/L (ref 15–41)
Albumin: 2.8 g/dL — ABNORMAL LOW (ref 3.5–5.0)
Alkaline Phosphatase: 72 U/L (ref 38–126)
Anion gap: 10 (ref 5–15)
BUN: 9 mg/dL (ref 8–23)
CO2: 21 mmol/L — ABNORMAL LOW (ref 22–32)
Calcium: 8.3 mg/dL — ABNORMAL LOW (ref 8.9–10.3)
Chloride: 101 mmol/L (ref 98–111)
Creatinine, Ser: 1.04 mg/dL (ref 0.61–1.24)
GFR, Estimated: >60 mL/min (ref >60)
Glucose, Bld: 158 mg/dL — ABNORMAL HIGH (ref 70–99)
Potassium: 4.3 mmol/L (ref 3.5–5.1)
Sodium: 132 mmol/L — ABNORMAL LOW (ref 135–145)
Total Bilirubin: 1.7 mg/dL — ABNORMAL HIGH (ref 0.0–1.2)
Total Protein: 6.2 g/dL — ABNORMAL LOW (ref 6.5–8.1)

## 2024-08-17 LAB — BASIC METABOLIC PANEL WITH GFR
Anion gap: 14 (ref 5–15)
BUN: 6 mg/dL — ABNORMAL LOW (ref 8–23)
CO2: 20 mmol/L — ABNORMAL LOW (ref 22–32)
Calcium: 8.4 mg/dL — ABNORMAL LOW (ref 8.9–10.3)
Chloride: 101 mmol/L (ref 98–111)
Creatinine, Ser: 0.86 mg/dL (ref 0.61–1.24)
GFR, Estimated: >60 mL/min (ref >60)
Glucose, Bld: 126 mg/dL — ABNORMAL HIGH (ref 70–99)
Potassium: 3.8 mmol/L (ref 3.5–5.1)
Sodium: 135 mmol/L (ref 135–145)

## 2024-08-17 LAB — CBC
HCT: 44 % (ref 39.0–52.0)
Hemoglobin: 14.9 g/dL (ref 13.0–17.0)
MCH: 28.6 pg (ref 26.0–34.0)
MCHC: 33.9 g/dL (ref 30.0–36.0)
MCV: 84.5 fL (ref 80.0–100.0)
Platelets: 126 K/uL — ABNORMAL LOW (ref 150–400)
RBC: 5.21 MIL/uL (ref 4.22–5.81)
RDW: 15.6 % — ABNORMAL HIGH (ref 11.5–15.5)
WBC: 5.9 K/uL (ref 4.0–10.5)
nRBC: 0 % (ref 0.0–0.2)

## 2024-08-17 LAB — HEPARIN LEVEL (UNFRACTIONATED)
Heparin Unfractionated: 0.28 [IU]/mL — ABNORMAL LOW (ref 0.30–0.70)
Heparin Unfractionated: 0.3 [IU]/mL (ref 0.30–0.70)
Heparin Unfractionated: 0.33 [IU]/mL (ref 0.30–0.70)

## 2024-08-17 MED ORDER — METOPROLOL TARTRATE 12.5 MG HALF TABLET
12.5000 mg | ORAL_TABLET | Freq: Two times a day (BID) | ORAL | Status: DC
  Administered 2024-08-17: 12.5 mg via ORAL

## 2024-08-17 MED ORDER — METOPROLOL TARTRATE 25 MG PO TABS
25.0000 mg | ORAL_TABLET | Freq: Two times a day (BID) | ORAL | Status: DC
  Administered 2024-08-17: 25 mg via ORAL
  Filled 2024-08-17: qty 1

## 2024-08-17 MED ORDER — PROCHLORPERAZINE EDISYLATE 10 MG/2ML IJ SOLN: 5.0000 mg | Freq: Four times a day (QID) | INTRAMUSCULAR | Status: DC | PRN

## 2024-08-17 MED ORDER — ADULT MULTIVITAMIN W/MINERALS CH
1.0000 | ORAL_TABLET | Freq: Every day | ORAL | Status: DC
  Administered 2024-08-17 – 2024-08-26 (×10): 1 via ORAL
  Filled 2024-08-17 (×10): qty 1

## 2024-08-17 MED ORDER — NAPHAZOLINE-GLYCERIN 0.012-0.25 % OP SOLN
1.0000 [drp] | Freq: Four times a day (QID) | OPHTHALMIC | Status: DC | PRN
  Filled 2024-08-17: qty 15

## 2024-08-17 MED ORDER — GABAPENTIN 300 MG PO CAPS
300.0000 mg | ORAL_CAPSULE | Freq: Three times a day (TID) | ORAL | Status: DC
  Administered 2024-08-17 – 2024-08-26 (×29): 300 mg via ORAL
  Filled 2024-08-17 (×30): qty 1

## 2024-08-17 MED ORDER — POLYETHYLENE GLYCOL 3350 17 G PO PACK
17.0000 g | PACK | Freq: Every day | ORAL | Status: DC | PRN
  Administered 2024-08-21 – 2024-08-26 (×2): 17 g via ORAL
  Filled 2024-08-17 (×2): qty 1

## 2024-08-17 MED ORDER — POTASSIUM CHLORIDE CRYS ER 20 MEQ PO TBCR
20.0000 meq | EXTENDED_RELEASE_TABLET | Freq: Once | ORAL | Status: AC
  Administered 2024-08-17: 20 meq via ORAL
  Filled 2024-08-17: qty 1

## 2024-08-17 MED ORDER — LORAZEPAM 2 MG/ML IJ SOLN
1.0000 mg | INTRAMUSCULAR | Status: AC | PRN
  Administered 2024-08-17 (×2): 2 mg via INTRAVENOUS
  Administered 2024-08-17: 1 mg via INTRAVENOUS
  Administered 2024-08-17 – 2024-08-18 (×2): 2 mg via INTRAVENOUS
  Administered 2024-08-18: 1 mg via INTRAVENOUS
  Filled 2024-08-17 (×7): qty 1

## 2024-08-17 MED ORDER — MUPIROCIN 2 % EX OINT
1.0000 | TOPICAL_OINTMENT | Freq: Two times a day (BID) | CUTANEOUS | Status: AC
  Administered 2024-08-17 – 2024-08-20 (×4): 1 via NASAL
  Filled 2024-08-17: qty 22

## 2024-08-17 MED ORDER — FOLIC ACID 1 MG PO TABS
1.0000 mg | ORAL_TABLET | Freq: Every day | ORAL | Status: DC
  Administered 2024-08-17 – 2024-08-26 (×10): 1 mg via ORAL
  Filled 2024-08-17 (×10): qty 1

## 2024-08-17 MED ORDER — THIAMINE MONONITRATE 100 MG PO TABS
100.0000 mg | ORAL_TABLET | Freq: Every day | ORAL | Status: DC
  Administered 2024-08-17 – 2024-08-26 (×10): 100 mg via ORAL
  Filled 2024-08-17 (×10): qty 1

## 2024-08-17 MED ORDER — METOCLOPRAMIDE HCL 5 MG/ML IJ SOLN
5.0000 mg | INTRAMUSCULAR | Status: AC
  Administered 2024-08-17: 5 mg via INTRAVENOUS

## 2024-08-17 MED ORDER — DIPHENHYDRAMINE HCL 50 MG/ML IJ SOLN
12.5000 mg | INTRAMUSCULAR | Status: AC
  Administered 2024-08-17: 12.5 mg via INTRAVENOUS

## 2024-08-17 MED ORDER — THIAMINE HCL 100 MG/ML IJ SOLN
100.0000 mg | Freq: Every day | INTRAMUSCULAR | Status: DC
  Filled 2024-08-17 (×4): qty 2

## 2024-08-17 MED ORDER — KETOROLAC TROMETHAMINE 15 MG/ML IJ SOLN
15.0000 mg | INTRAMUSCULAR | Status: AC
  Administered 2024-08-17: 15 mg via INTRAVENOUS

## 2024-08-17 MED ORDER — PERFLUTREN LIPID MICROSPHERE
1.0000 mL | INTRAVENOUS | Status: AC | PRN
  Administered 2024-08-17: 3 mL via INTRAVENOUS

## 2024-08-17 MED ORDER — METOPROLOL TARTRATE 25 MG PO TABS
25.0000 mg | ORAL_TABLET | Freq: Three times a day (TID) | ORAL | Status: DC
Start: 2024-08-17 — End: 2024-08-18
  Administered 2024-08-17 (×2): 25 mg via ORAL
  Filled 2024-08-17 (×2): qty 1

## 2024-08-17 MED ORDER — LORAZEPAM 1 MG PO TABS
1.0000 mg | ORAL_TABLET | ORAL | Status: AC | PRN
  Administered 2024-08-18 – 2024-08-19 (×4): 1 mg via ORAL
  Filled 2024-08-17 (×5): qty 1

## 2024-08-17 MED ORDER — CHLORHEXIDINE GLUCONATE CLOTH 2 % EX PADS
6.0000 | MEDICATED_PAD | Freq: Every day | CUTANEOUS | Status: AC
  Administered 2024-08-17 – 2024-08-19 (×3): 6 via TOPICAL

## 2024-08-17 NOTE — Progress Notes (Signed)
 ANTICOAGULATION CONSULT NOTE Pharmacy Consult for Heparin  Indication: atrial fibrillation Brief A/P: Heparin  level therapeutic; confirmatory heparin  level @ 2200  Allergies  Allergen Reactions   Carrot [Daucus Carota] Anaphylaxis, Swelling and Other (See Comments)    Lips swell- had to receive Benadryl     Patient Measurements: Height: 5' 9 (175.3 cm) Weight: 101.3 kg (223 lb 5.2 oz) IBW/kg (Calculated) : 70.7 Heparin  Dosing Weight: 93.2 kg  Vital Signs: Temp: 97.6 F (36.4 C) (11/01 0739) Temp Source: Oral (11/01 0739) BP: 119/86 (11/01 1158) Pulse Rate: 87 (11/01 1158)  Labs: Recent Labs    08/16/24 2002 08/16/24 2216 08/17/24 0442 08/17/24 1224 08/17/24 1401  HGB 16.2  --  14.9  --   --   HCT 49.5  --  44.0  --   --   PLT 153  --  126*  --   --   HEPARINUNFRC  --   --  0.28*  --  0.30  CREATININE 0.95  --  0.86 1.04  --   TROPONINIHS 6 6  --   --   --     Estimated Creatinine Clearance: 78.6 mL/min (by C-G formula based on SCr of 1.04 mg/dL).   Assessment: 68 y.o. [name] with Afib on heparin . CBC stable. No s/sx of bleeding noted per RN.   Goal of Therapy:  Heparin  level 0.3-0.7 units/ml Monitor platelets by anticoagulation protocol: Yes   Plan:  Continue Heparin  1400 units/hr Check heparin  level in 8 hours.  R. Samual Satterfield, PharmD PGY-1 Acute Care Pharmacy Resident [facility] Please refer to [facility] for [facility] numbers 08/17/2024 2:32 PM
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
## 2024-08-17 NOTE — Progress Notes (Signed)
*  PRELIMINARY RESULTS* Echocardiogram 2D Echocardiogram has been performed.  [name] 08/17/2024, 1:38 PM

## 2024-08-17 NOTE — Plan of Care (Signed)

## 2024-08-17 NOTE — Progress Notes (Signed)
 ANTICOAGULATION CONSULT NOTE Pharmacy Consult for Heparin  Indication: atrial fibrillation Brief A/P: Heparin  level subtherapeutic Increase Heparin  rate  Allergies  Allergen Reactions   Carrot [Daucus Carota] Anaphylaxis, Swelling and Other (See Comments)    Lips swell- had to receive Benadryl     Patient Measurements: Height: 5' 9 (175.3 cm) Weight: 101.3 kg (223 lb 5.2 oz) IBW/kg (Calculated) : 70.7 Heparin  Dosing Weight: 93.2 kg  Vital Signs: Temp: 97.7 F (36.5 C) (11/01 0337) Temp Source: Oral (11/01 0337) BP: 125/78 (11/01 0337) Pulse Rate: 117 (11/01 0424)  Labs: Recent Labs    08/16/24 2002 08/16/24 2216 08/17/24 0442  HGB 16.2  --  14.9  HCT 49.5  --  44.0  PLT 153  --  126*  HEPARINUNFRC  --   --  0.28*  CREATININE 0.95  --  0.86  TROPONINIHS 6 6  --     Estimated Creatinine Clearance: 95.1 mL/min (by C-G formula based on SCr of 0.86 mg/dL).   Assessment: 68 y.o. male with Afib for heparin   Goal of Therapy:  Heparin  level 0.3-0.7 units/ml Monitor platelets by anticoagulation protocol: Yes   Plan:  Increase Heparin  1400 units/hr Check heparin  level in 8 hours.  Cathlyn Arrant, PharmD, BCPS

## 2024-08-17 NOTE — Plan of Care (Signed)

## 2024-08-17 NOTE — Progress Notes (Signed)
 ANTICOAGULATION CONSULT NOTE Pharmacy Consult for Heparin  Indication: atrial fibrillation  Allergies  Allergen Reactions   Carrot [Daucus Carota] Anaphylaxis, Swelling and Other (See Comments)    Lips swell- had to receive Benadryl     Patient Measurements: Height: 5' 9 (175.3 cm) Weight: 101.3 kg (223 lb 5.2 oz) IBW/kg (Calculated) : 70.7 Heparin  Dosing Weight: 93.2 kg  Vital Signs: Temp: 98.1 F (36.7 C) (11/01 1644) Temp Source: Oral (11/01 1644) BP: 119/61 (11/01 2205) Pulse Rate: 69 (11/01 2205)  Labs: Recent Labs    08/16/24 2002 08/16/24 2216 08/17/24 0442 08/17/24 1224 08/17/24 1401 08/17/24 2202  HGB 16.2  --  14.9  --   --   --   HCT 49.5  --  44.0  --   --   --   PLT 153  --  126*  --   --   --   HEPARINUNFRC  --   --  0.28*  --  0.30 0.33  CREATININE 0.95  --  0.86 1.04  --   --   TROPONINIHS 6 6  --   --   --   --     Estimated Creatinine Clearance: 78.6 mL/min (by C-G formula based on SCr of 1.04 mg/dL).   Assessment: 68 y.o. male with Afib on heparin . CBC stable. No s/sx of bleeding noted per RN.   Heparin  level came back therapeutic at 0.33, on 1400 units/hr. No s/sx of bleeding or infusion issues.   Goal of Therapy:  Heparin  level 0.3-0.7 units/ml Monitor platelets by anticoagulation protocol: Yes   Plan:  Continue Heparin  1400 units/hr Check heparin  level in 8 hours with AM labs   Thank you for allowing pharmacy to participate in this patient's care,  Suzen Sour, PharmD, BCCCP Clinical Pharmacist  Phone: [phone] 08/17/2024 10:29 PM  Please check AMION for all [facility] Pharmacy phone numbers After 10:00 PM, call Main Pharmacy [phone]

## 2024-08-17 NOTE — Care Management Obs Status (Signed)
 MEDICARE OBSERVATION STATUS NOTIFICATION   Patient Details  Name: [name] MRN: [id number] Date of Birth: [birth year]   Medicare Observation Status Notification Given:  Yes    Kahner Yanik G., RN 08/17/2024, 9:54 AM

## 2024-08-17 NOTE — Progress Notes (Signed)
   08/17/24 0337  Assess: MEWS Score  Temp 97.7 F (36.5 C)  BP 125/78  MAP (mmHg) 92  Pulse Rate 62  ECG Heart Rate (!) 130  Resp 20  Level of Consciousness Alert  SpO2 97 %  O2 Device Room Air  Patient Activity (if Appropriate) In bed  Assess: MEWS Score  MEWS Temp 0  MEWS Systolic 0  MEWS Pulse 3  MEWS RR 0  MEWS LOC 0  MEWS Score 3  MEWS Score Color Yellow  Assess: if the MEWS score is Yellow or Red  Were vital signs accurate and taken at a resting state? Yes  Does the patient meet 2 or more of the SIRS criteria? No  MEWS guidelines implemented  Yes, yellow  Treat  MEWS Interventions Considered administering scheduled or prn medications/treatments as ordered  Take Vital Signs  Increase Vital Sign Frequency  Yellow: Q2hr x1, continue Q4hrs until patient remains green for 12hrs  Escalate  MEWS: Escalate Yellow: Discuss with charge nurse and consider notifying provider and/or RRT  Notify: Charge Nurse/RN  Name of Charge Nurse/RN Notified [facility]  Provider Notification  Provider Name/Title Dr. Shona  Date Provider Notified 08/17/24  Time Provider Notified [phone]  Method of Notification Page (secure chat)  Notification Reason Other (Comment) (Yellow MEWS)  Provider response No new orders  Date of Provider Response 08/17/24  Time of Provider Response 0359  Assess: SIRS CRITERIA  SIRS Temperature  0  SIRS Respirations  0  SIRS Pulse 1  SIRS WBC 0  SIRS Score Sum  1

## 2024-08-17 NOTE — Progress Notes (Signed)
 PROGRESS NOTE  [name] FMW:[id number] DOB: December 23, 1955   PCP: Delores Rojelio Caldron, NP  Patient is from: Homeless.  Lives in a tent.  DOA: 08/16/2024 LOS: 0  Chief complaints Chief Complaint  Patient presents with   Chest Pain     Brief Narrative / Interim history: 68 year old M with PMH of HFrEF, A-fib not on AC, HTN, alcoholic liver cirrhosis, alcohol  abuse and polysubstance use brought to ED by EMS with shortness of breath and chest pain, and admitted with atrial fibrillation with RVR.   In ED, in A-fib with RVR to 130.  Serial troponin negative x 2.  CXR without acute finding.  Started on Cardizem  drip and heparin  drip.  CT abdomen and pelvis ordered.   Subjective: Seen and examined earlier this morning.  No major events overnight or this morning.  Continues to endorse chest pressure and palpitation.  Denies shortness of breath, nausea, vomiting or abdominal pain.  Denies diarrhea or UTI symptoms.  Reports drinking about five of the 40 ounce beer a day.   Assessment and plan: Persistent atrial fibrillation with RVR: Likely due to noncompliance and ongoing alcohol  use.  Drinks heavily.  He was in RVR to 130s on admission.  Heart rate improved, in the range of 80s to 110s.  TSH normal.  Prior TTE as below.  CHA2DS2-VASc score 3 but not a candidate for anticoagulation due to ongoing alcohol  abuse. -Continue Cardizem  and titrate for goal heart rate -Increase metoprolol  to 25 mg twice daily -Continue IV heparin  for anticoagulation while in house. -Update echocardiogram -Extensively counseled on the importance of alcohol  cessation -Optimize electrolytes  Chronic HFrEF: TTE with LVEF of 35 to 40% in 09/2023.  Appears euvolemic on exam.  CXR without acute finding.  Not on diuretics. -Update echocardiogram -Encouraged alcohol  cessation -Strict intake and output, daily weight, renal functions and electrolytes  Alcohol  use disorder: Reports drinking about 5 of the 40 ounce beers a  day.  No withdrawal symptoms currently.  Last CIWA score 4. -Encouraged alcohol  cessation. -Continue CIWA with as needed Ativan  -Continue home gabapentin . -Multivitamin, thiamine  and folic acid  -TOC for more counseling resources  Alcoholic liver cirrhosis without ascites: Appears compensated. - Encouraged alcohol  cessation.   Essential hypertension: Normotensive -Metoprolol  and Cardizem  as above  History of substance use: UDS negative.  Homelessness: Reportedly not interested in shelter and rather will live in his tent  Thrombocytopenia: Mild. - Continue monitoring  Class I obesity Body mass index is 32.98 kg/m.           DVT prophylaxis:  On full dose anticoagulation  Code Status: Full code Family Communication: None at bedside Level of care: Telemetry Status is: Observation The patient will require care spanning > 2 midnights and should be moved to inpatient because: Atrial fibrillation with RVR   Final disposition: Back to tenths   55 minutes with more than 50% spent in reviewing records, counseling patient/family and coordinating care.  Consultants:  None  Procedures: None  Microbiology summarized: None  Objective: Vitals:   08/17/24 0337 08/17/24 0424 08/17/24 0739 08/17/24 0800  BP: 125/78  (!) 120/56   Pulse: 62 (!) 117  90  Resp: 20  19   Temp: 97.7 F (36.5 C)  97.6 F (36.4 C)   TempSrc: Oral  Oral   SpO2: 97%     Weight: 101.3 kg     Height: 5' 9 (1.753 m)       Examination:  GENERAL: No apparent distress.  Nontoxic. HEENT:  MMM.  Vision and hearing grossly intact.  NECK: Supple.  No apparent JVD.  RESP:  No IWOB.  Fair aeration bilaterally. CVS: Irregular rhythm.  HR ranges from 90s to 110s.  Heart sounds normal.  ABD/GI/GU: BS+. Abd soft, NTND.  MSK/EXT:  Moves extremities. No apparent deformity. No edema.  SKIN: no apparent skin lesion or wound NEURO: AA.  Oriented appropriately.  No apparent focal neuro deficit. PSYCH:  Calm. Normal affect.   Sch Meds:  Scheduled Meds:  Chlorhexidine  Gluconate Cloth  6 each Topical Daily   folic acid   1 mg Oral Daily   gabapentin   300 mg Oral TID   metoprolol  tartrate  25 mg Oral BID   multivitamin with minerals  1 tablet Oral Daily   mupirocin  ointment  1 Application Nasal BID   thiamine   100 mg Oral Daily   Or   thiamine   100 mg Intravenous Daily   Continuous Infusions:  diltiazem  (CARDIZEM ) infusion 15 mg/hr (08/17/24 9385)   heparin  1,400 Units/hr (08/17/24 0618)   PRN Meds:.LORazepam  **OR** LORazepam , naphazoline-glycerin , polyethylene glycol, prochlorperazine   Antimicrobials: Anti-infectives (From admission, onward)    None        I have personally reviewed the following labs and images: CBC: Recent Labs  Lab 08/16/24 2002 08/17/24 0442  WBC 7.2 5.9  HGB 16.2 14.9  HCT 49.5 44.0  MCV 84.9 84.5  PLT 153 126*   BMP &GFR Recent Labs  Lab 08/16/24 2002 08/17/24 0442  NA 138 135  K 3.8 3.8  CL 102 101  CO2 22 20*  GLUCOSE 94 126*  BUN 5* 6*  CREATININE 0.95 0.86  CALCIUM  8.7* 8.4*  MG 1.8  --    Estimated Creatinine Clearance: 95.1 mL/min (by C-G formula based on SCr of 0.86 mg/dL). Liver & Pancreas: No results for input(s): AST, ALT, ALKPHOS, BILITOT, PROT, ALBUMIN  in the last 168 hours. Recent Labs  Lab 08/16/24 2002  LIPASE 32   No results for input(s): AMMONIA in the last 168 hours. Diabetic: No results for input(s): HGBA1C in the last 72 hours. No results for input(s): GLUCAP in the last 168 hours. Cardiac Enzymes: No results for input(s): CKTOTAL, CKMB, CKMBINDEX, TROPONINI in the last 168 hours. No results for input(s): PROBNP in the last 8760 hours. Coagulation Profile: No results for input(s): INR, PROTIME in the last 168 hours. Thyroid  Function Tests: Recent Labs    08/16/24 2002 08/16/24 2030  TSH  --  4.470  FREET4 0.92  --    Lipid Profile: No results for input(s): CHOL,  HDL, LDLCALC, TRIG, CHOLHDL, LDLDIRECT in the last 72 hours. Anemia Panel: No results for input(s): VITAMINB12, FOLATE, FERRITIN, TIBC, IRON, RETICCTPCT in the last 72 hours. Urine analysis:    Component Value Date/Time   COLORURINE STRAW (A) 04/11/2024 2342   APPEARANCEUR CLEAR 04/11/2024 2342   LABSPEC 1.002 (L) 04/11/2024 2342   PHURINE 6.0 04/11/2024 2342   GLUCOSEU NEGATIVE 04/11/2024 2342   HGBUR NEGATIVE 04/11/2024 2342   HGBUR negative 05/12/2010 1022   BILIRUBINUR NEGATIVE 04/11/2024 2342   KETONESUR 5 (A) 04/11/2024 2342   PROTEINUR NEGATIVE 04/11/2024 2342   UROBILINOGEN 1.0 03/17/2012 1530   NITRITE NEGATIVE 04/11/2024 2342   LEUKOCYTESUR NEGATIVE 04/11/2024 2342   Sepsis Labs: Invalid input(s): PROCALCITONIN, LACTICIDVEN  Microbiology: Recent Results (from the past 240 hours)  MRSA Next Gen by PCR, Nasal     Status: Abnormal   Collection Time: 08/17/24  3:48 AM   Specimen: Nasal Mucosa; Nasal Swab  Result Value Ref Range Status   MRSA by PCR Next Gen DETECTED (A) NOT DETECTED Final    Comment: RESULT CALLED TO, READ BACK BY AND VERIFIED WITH: A BUENDIA RN 08/17/2024 @ 9376 BY AB (NOTE) The GeneXpert MRSA Assay (FDA approved for NASAL specimens only), is one component of a comprehensive MRSA colonization surveillance program. It is not intended to diagnose MRSA infection nor to guide or monitor treatment for MRSA infections. Test performance is not FDA approved in patients less than 53 years old. Performed at [facility], [address] [place], [place] [address]     Radiology Studies: CT ABDOMEN PELVIS W CONTRAST Result Date: 08/16/2024 EXAM: CT ABDOMEN AND PELVIS WITH CONTRAST 08/16/2024 10:41:07 PM TECHNIQUE: CT of the abdomen and pelvis was performed with the administration of 75 mL of iohexol  (OMNIPAQUE ) 350 MG/ML injection. Multiplanar reformatted images are provided for review. Automated exposure control, iterative  reconstruction, and/or weight-based adjustment of the mA/kV was utilized to reduce the radiation dose to as low as reasonably achievable. COMPARISON: 10/19/2023 CLINICAL HISTORY: Abdominal pain, acute, nonlocalized. FINDINGS: LOWER CHEST: No acute abnormality. LIVER: Changes of cirrhosis and diffuse fatty infiltration of the liver. GALLBLADDER AND BILE DUCTS: Prior cholecystectomy. No biliary ductal dilatation. SPLEEN: No acute abnormality. PANCREAS: No acute abnormality. ADRENAL GLANDS: No acute abnormality. KIDNEYS, URETERS AND BLADDER: No stones in the kidneys or ureters. No hydronephrosis. No perinephric or periureteral stranding. Urinary bladder is unremarkable. GI AND BOWEL: Stomach demonstrates no acute abnormality. Wall thickening involving the 1st and 2nd portions of the duodenum with surrounding inflammation compatible with duodenitis. There is no bowel obstruction. PERITONEUM AND RETROPERITONEUM: No ascites. No free air. VASCULATURE: Aorta is normal in caliber. Aortic atherosclerosis. LYMPH NODES: No lymphadenopathy. REPRODUCTIVE ORGANS: No acute abnormality. BONES AND SOFT TISSUES: No acute osseous abnormality. No focal soft tissue abnormality. IMPRESSION: 1. Wall thickening involving the first and second portions of the duodenum with surrounding inflammation, compatible with duodenitis. 2. Changes of cirrhosis and diffuse hepatic steatosis. Electronically signed by: Franky Crease MD 08/16/2024 10:45 PM EDT RP Workstation: [id number]   DG Chest Portable 1 View Result Date: 08/16/2024 CLINICAL DATA:  Central chest pain EXAM: PORTABLE CHEST 1 VIEW COMPARISON:  08/05/2024 FINDINGS: The heart size and mediastinal contours are within normal limits. Both lungs are clear. The visualized skeletal structures are unremarkable. IMPRESSION: No active disease. Electronically Signed   By: Ozell Daring M.D.   On: 08/16/2024 20:44      Channing Savich T. Labria [facility]  If 7PM-7AM, please contact  night-coverage [email or web address] 08/17/2024, 11:37 AM

## 2024-08-18 ENCOUNTER — Other Ambulatory Visit (HOSPITAL_COMMUNITY): Payer: Self-pay

## 2024-08-18 DIAGNOSIS — I4891 Unspecified atrial fibrillation: Secondary | ICD-10-CM | POA: Diagnosis not present

## 2024-08-18 DIAGNOSIS — F1090 Alcohol use, unspecified, uncomplicated: Secondary | ICD-10-CM | POA: Diagnosis not present

## 2024-08-18 LAB — RENAL FUNCTION PANEL
Albumin: 2.6 g/dL — ABNORMAL LOW (ref 3.5–5.0)
Anion gap: 9 (ref 5–15)
BUN: 9 mg/dL (ref 8–23)
CO2: 21 mmol/L — ABNORMAL LOW (ref 22–32)
Calcium: 8.7 mg/dL — ABNORMAL LOW (ref 8.9–10.3)
Chloride: 107 mmol/L (ref 98–111)
Creatinine, Ser: 0.97 mg/dL (ref 0.61–1.24)
GFR, Estimated: >60 mL/min (ref >60)
Glucose, Bld: 166 mg/dL — ABNORMAL HIGH (ref 70–99)
Phosphorus: 2.6 mg/dL (ref 2.5–4.6)
Potassium: 4.2 mmol/L (ref 3.5–5.1)
Sodium: 137 mmol/L (ref 135–145)

## 2024-08-18 LAB — CBC
HCT: 44.5 % (ref 39.0–52.0)
Hemoglobin: 14 g/dL (ref 13.0–17.0)
MCH: 28.3 pg (ref 26.0–34.0)
MCHC: 31.5 g/dL (ref 30.0–36.0)
MCV: 90.1 fL (ref 80.0–100.0)
Platelets: 112 K/uL — ABNORMAL LOW (ref 150–400)
RBC: 4.94 MIL/uL (ref 4.22–5.81)
RDW: 15.9 % — ABNORMAL HIGH (ref 11.5–15.5)
WBC: 5.5 K/uL (ref 4.0–10.5)
nRBC: 0 % (ref 0.0–0.2)

## 2024-08-18 LAB — ECHOCARDIOGRAM COMPLETE
Area-P 1/2: 3.42 cm2
Height: 69 in
S' Lateral: 3 cm
Weight: 3573.22 [oz_av]

## 2024-08-18 LAB — HEMOGLOBIN A1C
Hgb A1c MFr Bld: 4.9 % (ref 4.8–5.6)
Mean Plasma Glucose: 93.93 mg/dL

## 2024-08-18 LAB — MAGNESIUM: Magnesium: 1.7 mg/dL (ref 1.7–2.4)

## 2024-08-18 LAB — HEPARIN LEVEL (UNFRACTIONATED): Heparin Unfractionated: 0.27 [IU]/mL — ABNORMAL LOW (ref 0.30–0.70)

## 2024-08-18 MED ORDER — MAGNESIUM SULFATE 2 GM/50ML IV SOLN
2.0000 g | Freq: Once | INTRAVENOUS | Status: AC
  Administered 2024-08-18: 2 g via INTRAVENOUS
  Filled 2024-08-18: qty 50

## 2024-08-18 MED ORDER — METOPROLOL TARTRATE 25 MG PO TABS
37.5000 mg | ORAL_TABLET | Freq: Three times a day (TID) | ORAL | Status: DC
  Administered 2024-08-18: 37.5 mg via ORAL
  Filled 2024-08-18: qty 1

## 2024-08-18 MED ORDER — METOPROLOL TARTRATE 50 MG PO TABS
50.0000 mg | ORAL_TABLET | Freq: Three times a day (TID) | ORAL | Status: DC
Start: 2024-08-18 — End: 2024-08-19
  Administered 2024-08-18 (×2): 50 mg via ORAL
  Filled 2024-08-18 (×2): qty 1

## 2024-08-18 MED ORDER — BACLOFEN 10 MG PO TABS
5.0000 mg | ORAL_TABLET | Freq: Three times a day (TID) | ORAL | Status: DC
  Administered 2024-08-18 – 2024-08-19 (×4): 5 mg via ORAL
  Filled 2024-08-18 (×4): qty 1

## 2024-08-18 MED ORDER — APIXABAN 5 MG PO TABS
5.0000 mg | ORAL_TABLET | Freq: Two times a day (BID) | ORAL | Status: DC
  Administered 2024-08-18 – 2024-08-21 (×7): 5 mg via ORAL
  Filled 2024-08-18 (×7): qty 1

## 2024-08-18 MED ORDER — METOPROLOL TARTRATE 5 MG/5ML IV SOLN
2.5000 mg | INTRAVENOUS | Status: DC | PRN
  Administered 2024-08-19 – 2024-08-20 (×2): 2.5 mg via INTRAVENOUS
  Filled 2024-08-18 (×2): qty 5

## 2024-08-18 NOTE — Progress Notes (Signed)
 Patient states that he has very frequently been having chest pains throughout the day. He states that they are located mid upper chest and sometimes they last for a long time and other times not so long. No chest pain at this time.

## 2024-08-18 NOTE — Progress Notes (Signed)
 PROGRESS NOTE  [name] FMW:[id number] DOB: [birth year]   PCP: Delores Rojelio Caldron, NP  Patient is from: Homeless.  Lives in a tent.  DOA: 08/16/2024 LOS: 1  Chief complaints Chief Complaint  Patient presents with   Chest Pain     Brief Narrative / Interim history: 68 year old M with PMH of HFrEF, A-fib not on AC, HTN, alcoholic liver cirrhosis, alcohol  abuse and polysubstance use brought to ED by EMS with shortness of breath and chest pain, and admitted with atrial fibrillation with RVR.   In ED, in A-fib with RVR to 130.  Serial troponin negative x 2.  CXR without acute finding.  Started on Cardizem  drip and heparin  drip.  CT abdomen and pelvis ordered.   CT abdomen and pelvis suggested duodenitis.  TTE with LVEF of 40 to 45%.  Subjective: Seen and examined earlier this morning.  No major events overnight or this morning.  Reports pain all over.  HR fluctuates between 90 and 110s.   Assessment and plan: Persistent atrial fibrillation with RVR: Likely due to noncompliance and ongoing alcohol  use.  Drinks heavily.  He was in RVR to 130s on admission.  TSH normal.  TTE with LVEF of 40 to 45% (previously 35 to 40%), severe LAE.  CHA2DS2-VASc score 3 but not a candidate for anticoagulation due to ongoing alcohol  abuse. -Increase metoprolol  to 50 mg 3 times daily. -Discontinue Cardizem  drip. -Change anticoagulation to Eliquis  while in house. -Extensively counseled on the importance of alcohol  cessation -Optimize electrolytes  Chronic HFrEF: TTE as above.  Appears euvolemic on exam.  CXR without acute finding.  Not on diuretics. -Discontinue Cardizem  drip -Increase metoprolol  as above. -Encouraged alcohol  cessation -Strict intake and output, daily weight, renal functions and electrolytes  Alcohol  use disorder: Reports drinking about 5 of the 40 ounce beers a day.  A little jittery today.  Last 3 CIWA scores are 11, 8 and 4. -Encouraged alcohol  cessation. -Continue CIWA with  as needed Ativan  -Continue home gabapentin . -Multivitamin, thiamine  and folic acid  -TOC for more counseling resources  Alcoholic liver cirrhosis without ascites: Appears compensated. - Encouraged alcohol  cessation.   Essential hypertension: Normotensive -Metoprolol  as above.  History of substance use: UDS negative.  Homelessness: Reportedly not interested in shelter and rather will live in his tent  Thrombocytopenia: Mild. - Continue monitoring  Class I obesity Body mass index is 32.98 kg/m.           DVT prophylaxis:  apixaban  (ELIQUIS ) tablet 5 mg  Code Status: Full code Family Communication: None at bedside Level of care: Telemetry Status is: Inpatient The patient will remain inpatient because: Atrial fibrillation with RVR   Final disposition: Back to his tent   55 minutes with more than 50% spent in reviewing records, counseling patient/family and coordinating care.  Consultants:  None  Procedures: None  Microbiology summarized: None  Objective: Vitals:   08/17/24 2205 08/17/24 2349 08/18/24 0242 08/18/24 0846  BP: 119/61 113/67 103/73 108/74  Pulse: 69 70  (!) 115  Resp: 20 18 18    Temp:      TempSrc:      SpO2: 95% 97% 98%   Weight:      Height:        Examination:  GENERAL: No apparent distress.  Nontoxic. HEENT: MMM.  Vision and hearing grossly intact.  NECK: Supple.  No apparent JVD.  RESP:  No IWOB.  Fair aeration bilaterally. CVS: Irregular rhythm.  HR ranges from 90s to 110s.  Heart  sounds normal.  ABD/GI/GU: BS+. Abd soft, NTND.  MSK/EXT:  Moves extremities. No apparent deformity. No edema.  SKIN: no apparent skin lesion or wound NEURO: AA.  Oriented appropriately.  No apparent focal neuro deficit. PSYCH: Calm. Normal affect.   Sch Meds:  Scheduled Meds:  apixaban   5 mg Oral BID   baclofen   5 mg Oral TID   Chlorhexidine  Gluconate Cloth  6 each Topical Daily   folic acid   1 mg Oral Daily   gabapentin   300 mg Oral TID    metoprolol  tartrate  50 mg Oral TID   multivitamin with minerals  1 tablet Oral Daily   mupirocin  ointment  1 Application Nasal BID   thiamine   100 mg Oral Daily   Or   thiamine   100 mg Intravenous Daily   Continuous Infusions:   PRN Meds:.LORazepam  **OR** LORazepam , naphazoline-glycerin , polyethylene glycol, prochlorperazine   Antimicrobials: Anti-infectives (From admission, onward)    None        I have personally reviewed the following labs and images: CBC: Recent Labs  Lab 08/16/24 2002 08/17/24 0442 08/18/24 0510  WBC 7.2 5.9 5.5  HGB 16.2 14.9 14.0  HCT 49.5 44.0 44.5  MCV 84.9 84.5 90.1  PLT 153 126* 112*   BMP &GFR Recent Labs  Lab 08/16/24 2002 08/17/24 0442 08/17/24 1224 08/18/24 0510 08/18/24 0934  NA 138 135 132*  --  137  K 3.8 3.8 4.3  --  4.2  CL 102 101 101  --  107  CO2 22 20* 21*  --  21*  GLUCOSE 94 126* 158*  --  166*  BUN 5* 6* 9  --  9  CREATININE 0.95 0.86 1.04  --  0.97  CALCIUM  8.7* 8.4* 8.3*  --  8.7*  MG 1.8  --   --  1.7  --   PHOS  --   --   --   --  2.6   Estimated Creatinine Clearance: 84.3 mL/min (by C-G formula based on SCr of 0.97 mg/dL). Liver & Pancreas: Recent Labs  Lab 08/17/24 1224 08/18/24 0934  AST 32  --   ALT 23  --   ALKPHOS 72  --   BILITOT 1.7*  --   PROT 6.2*  --   ALBUMIN  2.8* 2.6*   Recent Labs  Lab 08/16/24 2002  LIPASE 32   No results for input(s): AMMONIA in the last 168 hours. Diabetic: No results for input(s): HGBA1C in the last 72 hours. No results for input(s): GLUCAP in the last 168 hours. Cardiac Enzymes: No results for input(s): CKTOTAL, CKMB, CKMBINDEX, TROPONINI in the last 168 hours. No results for input(s): PROBNP in the last 8760 hours. Coagulation Profile: No results for input(s): INR, PROTIME in the last 168 hours. Thyroid  Function Tests: Recent Labs    08/16/24 2002 08/16/24 2030  TSH  --  4.470  FREET4 0.92  --    Lipid Profile: No results for  input(s): CHOL, HDL, LDLCALC, TRIG, CHOLHDL, LDLDIRECT in the last 72 hours. Anemia Panel: No results for input(s): VITAMINB12, FOLATE, FERRITIN, TIBC, IRON, RETICCTPCT in the last 72 hours. Urine analysis:    Component Value Date/Time   COLORURINE STRAW (A) 04/11/2024 2342   APPEARANCEUR CLEAR 04/11/2024 2342   LABSPEC 1.002 (L) 04/11/2024 2342   PHURINE 6.0 04/11/2024 2342   GLUCOSEU NEGATIVE 04/11/2024 2342   HGBUR NEGATIVE 04/11/2024 2342   HGBUR negative 05/12/2010 1022   BILIRUBINUR NEGATIVE 04/11/2024 2342   KETONESUR 5 (A) 04/11/2024  2342   PROTEINUR NEGATIVE 04/11/2024 2342   UROBILINOGEN 1.0 03/17/2012 1530   NITRITE NEGATIVE 04/11/2024 2342   LEUKOCYTESUR NEGATIVE 04/11/2024 2342   Sepsis Labs: Invalid input(s): PROCALCITONIN, LACTICIDVEN  Microbiology: Recent Results (from the past 240 hours)  MRSA Next Gen by PCR, Nasal     Status: Abnormal   Collection Time: 08/17/24  3:48 AM   Specimen: Nasal Mucosa; Nasal Swab  Result Value Ref Range Status   MRSA by PCR Next Gen DETECTED (A) NOT DETECTED Final    Comment: RESULT CALLED TO, READ BACK BY AND VERIFIED WITH: A BUENDIA RN 08/17/2024 @ 9376 BY AB (NOTE) The GeneXpert MRSA Assay (FDA approved for NASAL specimens only), is one component of a comprehensive MRSA colonization surveillance program. It is not intended to diagnose MRSA infection nor to guide or monitor treatment for MRSA infections. Test performance is not FDA approved in patients less than 32 years old. Performed at [facility], [address], [place], [place] [address]     Radiology Studies: ECHOCARDIOGRAM COMPLETE Result Date: 08/18/2024    ECHOCARDIOGRAM REPORT   Patient Name:   [name] Date of Exam: 08/17/2024 Medical Rec #:  [id number]      Height:       69.0 in Accession #:    [id number]     Weight:       223.3 lb Date of Birth:  [birth year]      BSA:          2.165 m Patient Age:    68 years       BP:            120/56 mmHg Patient Gender: M              HR:           90 bpm. Exam Location:  Inpatient Procedure: 2D Echo, Cardiac Doppler, Color Doppler and Intracardiac            Opacification Agent (Both Spectral and Color Flow Doppler were            utilized during procedure). Indications:    Atrial Fibrillation l48.91  History:        Patient has prior history of Echocardiogram examinations, most                 recent 10/06/2023. CHF, Arrythmias:Atrial Fibrillation; Risk                 Factors:Hypertension.  Sonographer:    Aida Pizza RCS Referring Phys: [id number] MIGNON DASEN Hyacinth Marcelli IMPRESSIONS  1. No LV thrombus by Definity . Left ventricular ejection fraction, by estimation, is 40 to 45%. The left ventricle has mildly decreased function. Left ventricular endocardial border not optimally defined to evaluate regional wall motion. There is mild left ventricular hypertrophy. Left ventricular diastolic function could not be evaluated.  2. Right ventricular systolic function is moderately reduced. The right ventricular size is moderately enlarged. Tricuspid regurgitation signal is inadequate for assessing PA pressure.  3. Left atrial size was severely dilated.  4. Right atrial size was moderately dilated.  5. The mitral valve is abnormal. Mild mitral valve regurgitation. No evidence of mitral stenosis. Moderate mitral annular calcification.  6. The aortic valve has an indeterminant number of cusps. Aortic valve regurgitation is not visualized. No aortic stenosis is present. Comparison(s): Changes from prior study are noted. FINDINGS  Left Ventricle: No LV thrombus by Definity . Left ventricular ejection fraction, by estimation, is 40  to 45%. The left ventricle has mildly decreased function. Left ventricular endocardial border not optimally defined to evaluate regional wall motion. Definity  contrast agent was given IV to delineate the left ventricular endocardial borders. Strain was performed and the global longitudinal  strain is indeterminate. The left ventricular internal cavity size was normal in size. There is mild left ventricular hypertrophy. Left ventricular diastolic function could not be evaluated due to atrial fibrillation. Left ventricular diastolic function could not be evaluated. Right Ventricle: The right ventricular size is moderately enlarged. No increase in right ventricular wall thickness. Right ventricular systolic function is moderately reduced. Tricuspid regurgitation signal is inadequate for assessing PA pressure. Left Atrium: Left atrial size was severely dilated. Right Atrium: Right atrial size was moderately dilated. Pericardium: There is no evidence of pericardial effusion. Mitral Valve: The mitral valve is abnormal. Moderate mitral annular calcification. Mild mitral valve regurgitation. No evidence of mitral valve stenosis. Tricuspid Valve: The tricuspid valve is normal in structure. Tricuspid valve regurgitation is mild . No evidence of tricuspid stenosis. Aortic Valve: The aortic valve has an indeterminant number of cusps. Aortic valve regurgitation is not visualized. No aortic stenosis is present. Pulmonic Valve: The pulmonic valve was not well visualized. Pulmonic valve regurgitation is not visualized. No evidence of pulmonic stenosis. Aorta: The aortic root is normal in size and structure. Venous: The inferior vena cava was not well visualized. IAS/Shunts: No atrial level shunt detected by color flow Doppler. Additional Comments: 3D was performed not requiring image post processing on an independent workstation and was indeterminate.  LEFT VENTRICLE PLAX 2D LVIDd:         4.30 cm LVIDs:         3.00 cm LV PW:         1.10 cm LV IVS:        1.20 cm LVOT diam:     2.10 cm LV SV:         61 LV SV Index:   28 LVOT Area:     3.46 cm  RIGHT VENTRICLE RV S prime:     10.20 cm/s TAPSE (M-mode): 1.7 cm LEFT ATRIUM              Index        RIGHT ATRIUM           Index LA diam:        4.50 cm  2.08 cm/m   RA  Area:     23.60 cm LA Vol (A2C):   83.9 ml  38.75 ml/m  RA Volume:   77.70 ml  35.89 ml/m LA Vol (A4C):   106.0 ml 48.96 ml/m LA Biplane Vol: 100.0 ml 46.19 ml/m  AORTIC VALVE LVOT Vmax:   87.80 cm/s LVOT Vmean:  62.750 cm/s LVOT VTI:    0.175 m  AORTA Ao Root diam: 3.40 cm MITRAL VALVE MV Area (PHT): 3.42 cm     SHUNTS MV Decel Time: 222 msec     Systemic VTI:  0.18 m MV E velocity: 144.00 cm/s  Systemic Diam: 2.10 cm Vishnu Priya Mallipeddi Electronically signed by Diannah Late Mallipeddi Signature Date/Time: 08/18/2024/10:46:46 AM    Final       Zak Gondek T. Reginna Sermeno [facility]  If 7PM-7AM, please contact night-coverage [email or web address] 08/18/2024, 11:47 AM

## 2024-08-18 NOTE — Progress Notes (Signed)
 ANTICOAGULATION CONSULT NOTE Pharmacy Consult for Heparin  Indication: atrial fibrillation  Allergies  Allergen Reactions   Carrot [Daucus Carota] Anaphylaxis, Swelling and Other (See Comments)    Lips swell- had to receive Benadryl     Patient Measurements: Height: 5' 9 (175.3 cm) Weight: 101.3 kg (223 lb 5.2 oz) IBW/kg (Calculated) : 70.7 Heparin  Dosing Weight: 93.2 kg  Vital Signs: BP: 103/73 (11/02 0242) Pulse Rate: 70 (11/01 2349)  Labs: Recent Labs    08/16/24 2002 08/16/24 2002 08/16/24 2216 08/17/24 0442 08/17/24 1224 08/17/24 1401 08/17/24 2202 08/18/24 0510  HGB 16.2  --   --  14.9  --   --   --  14.0  HCT 49.5  --   --  44.0  --   --   --  44.5  PLT 153  --   --  126*  --   --   --  112*  HEPARINUNFRC  --    < >  --  0.28*  --  0.30 0.33 0.27*  CREATININE 0.95  --   --  0.86 1.04  --   --   --   TROPONINIHS 6  --  6  --   --   --   --   --    < > = values in this interval not displayed.    Estimated Creatinine Clearance: 78.6 mL/min (by C-G formula based on SCr of 1.04 mg/dL).   Assessment: 68 y.o. male with Afib on heparin . CBC stable. No s/sx of bleeding noted per RN.   Heparin  level came back slightly subtherapeutic at 0.27, on 1400 units/hr. No s/sx of bleeding or infusion issues.   Goal of Therapy:  Heparin  level 0.3-0.7 units/ml Monitor platelets by anticoagulation protocol: Yes   Plan:  Increase Heparin  1500 units/hr Check heparin  level in 8 hours  Daily anti-Xa, CBC  Thank you for allowing pharmacy to participate in this patient's care,  R. Samual Satterfield, PharmD PGY-1 Acute Care Pharmacy Resident [facility] Please refer to [facility] for [facility] numbers 08/18/2024 7:35 AM

## 2024-08-19 DIAGNOSIS — F1090 Alcohol use, unspecified, uncomplicated: Secondary | ICD-10-CM | POA: Diagnosis not present

## 2024-08-19 DIAGNOSIS — I10 Essential (primary) hypertension: Secondary | ICD-10-CM

## 2024-08-19 DIAGNOSIS — I4819 Other persistent atrial fibrillation: Secondary | ICD-10-CM

## 2024-08-19 DIAGNOSIS — I4891 Unspecified atrial fibrillation: Secondary | ICD-10-CM | POA: Diagnosis not present

## 2024-08-19 DIAGNOSIS — I509 Heart failure, unspecified: Secondary | ICD-10-CM | POA: Diagnosis not present

## 2024-08-19 LAB — CBC
HCT: 45.5 % (ref 39.0–52.0)
Hemoglobin: 14.6 g/dL (ref 13.0–17.0)
MCH: 27.8 pg (ref 26.0–34.0)
MCHC: 32.1 g/dL (ref 30.0–36.0)
MCV: 86.5 fL (ref 80.0–100.0)
Platelets: 112 K/uL — ABNORMAL LOW (ref 150–400)
RBC: 5.26 MIL/uL (ref 4.22–5.81)
RDW: 15.7 % — ABNORMAL HIGH (ref 11.5–15.5)
WBC: 5.9 K/uL (ref 4.0–10.5)
nRBC: 0 % (ref 0.0–0.2)

## 2024-08-19 LAB — RENAL FUNCTION PANEL
Albumin: 2.8 g/dL — ABNORMAL LOW (ref 3.5–5.0)
Anion gap: 13 (ref 5–15)
BUN: 9 mg/dL (ref 8–23)
CO2: 21 mmol/L — ABNORMAL LOW (ref 22–32)
Calcium: 8.9 mg/dL (ref 8.9–10.3)
Chloride: 105 mmol/L (ref 98–111)
Creatinine, Ser: 0.95 mg/dL (ref 0.61–1.24)
GFR, Estimated: >60 mL/min (ref >60)
Glucose, Bld: 136 mg/dL — ABNORMAL HIGH (ref 70–99)
Phosphorus: 2.6 mg/dL (ref 2.5–4.6)
Potassium: 4.3 mmol/L (ref 3.5–5.1)
Sodium: 139 mmol/L (ref 135–145)

## 2024-08-19 LAB — MAGNESIUM: Magnesium: 1.7 mg/dL (ref 1.7–2.4)

## 2024-08-19 MED ORDER — MAGNESIUM SULFATE 2 GM/50ML IV SOLN
2.0000 g | Freq: Once | INTRAVENOUS | Status: AC
  Administered 2024-08-19: 2 g via INTRAVENOUS
  Filled 2024-08-19: qty 50

## 2024-08-19 MED ORDER — METOPROLOL SUCCINATE ER 100 MG PO TB24
100.0000 mg | ORAL_TABLET | Freq: Two times a day (BID) | ORAL | Status: DC
  Administered 2024-08-19 – 2024-08-21 (×5): 100 mg via ORAL
  Filled 2024-08-19 (×6): qty 1

## 2024-08-19 MED ORDER — BACLOFEN 10 MG PO TABS
10.0000 mg | ORAL_TABLET | Freq: Three times a day (TID) | ORAL | Status: DC
  Administered 2024-08-19 – 2024-08-26 (×22): 10 mg via ORAL
  Filled 2024-08-19 (×23): qty 1

## 2024-08-19 MED ORDER — METOPROLOL TARTRATE 100 MG PO TABS
100.0000 mg | ORAL_TABLET | Freq: Two times a day (BID) | ORAL | Status: DC
Start: 2024-08-19 — End: 2024-08-19
  Administered 2024-08-19: 100 mg via ORAL
  Filled 2024-08-19: qty 1

## 2024-08-19 NOTE — Plan of Care (Signed)

## 2024-08-19 NOTE — Progress Notes (Signed)
 PROGRESS NOTE  [name] FMW:[id number] DOB: [birth year]   PCP: Delores Rojelio Caldron, NP  Patient is from: Homeless.  Lives in a tent.  DOA: 08/16/2024 LOS: 2  Chief complaints Chief Complaint  Patient presents with   Chest Pain     Brief Narrative / Interim history: 68 year old M with PMH of HFrEF, A-fib not on AC, HTN, alcoholic liver cirrhosis, alcohol  abuse and polysubstance use brought to ED by EMS with shortness of breath and chest pain, and admitted with atrial fibrillation with RVR.   In ED, in A-fib with RVR to 130.  Serial troponin negative x 2.  CXR without acute finding.  Started on Cardizem  drip and heparin  drip.  CT abdomen and pelvis ordered.   CT abdomen and pelvis suggested duodenitis.  TTE with LVEF of 40 to 45%.  Heart rate improved and he was transition to p.o. metoprolol  but back in A-fib with RVR again.  Cardiology consulted.  Subjective: Seen and examined earlier this morning.  No major events overnight or this morning.  Reports hurting all over.  Remains in RVR with HR ranging from 110s to 160s.  Assessment and plan: Persistent atrial fibrillation with RVR: Likely due to noncompliance and ongoing alcohol  use.  Drinks heavily.  He was in RVR to 130s on admission.  TSH normal.  TTE with LVEF of 40 to 45% (previously 35 to 40%), severe LAE.  CHA2DS2-VASc score 3 but not a candidate for anticoagulation due to ongoing alcohol  abuse. -Discontinued Cardizem  given his reduced ejection fraction. -Increase metoprolol  to home dose, 100 mg twice daily -IV metoprolol  as needed for sustained HR > 120. -Continue Eliquis  for anticoagulation while in house. -Extensively counseled on the importance of alcohol  cessation -Optimize electrolytes - Cardiology consulted.  Chronic HFrEF: TTE as above.  Appears euvolemic on exam.  CXR without acute finding.  Not on diuretics. -Increase metoprolol  as above. -Encouraged alcohol  cessation -Strict intake and output, daily weight,  renal functions and electrolytes -Cardiology consult.    Alcohol  use disorder: Reports drinking about 5 of the 40 ounce beers a day.  A little jittery today.  Last to see was 9 and 5 (scored 5 for headache). -Encouraged alcohol  cessation. -Continue CIWA with as needed Ativan  -Continue home gabapentin . -Multivitamin, thiamine  and folic acid  -TOC for more counseling resources  Alcoholic liver cirrhosis without ascites: Appears compensated. - Encouraged alcohol  cessation.   Essential hypertension: Normotensive -Metoprolol  as above.  History of substance use: UDS negative.  Homelessness: Reportedly not interested in shelter and rather will live in his tent  Generalized body pain/chronic pain - Increase baclofen  to 10 mg 3 times daily.  Thrombocytopenia: Mild. - Continue monitoring  Class I obesity Body mass index is 32.96 kg/m.           DVT prophylaxis:  apixaban  (ELIQUIS ) tablet 5 mg  Code Status: Full code Family Communication: None at bedside Level of care: Telemetry Status is: Inpatient The patient will remain inpatient because: Atrial fibrillation with RVR   Final disposition: Back to his tent   55 minutes with more than 50% spent in reviewing records, counseling patient/family and coordinating care.  Consultants:  Cardiology  Procedures: None  Microbiology summarized: None  Objective: Vitals:   08/19/24 0543 08/19/24 0702 08/19/24 0829 08/19/24 0902  BP: (!) 134/90 (!) 120/103 (!) 122/97 (!) 122/97  Pulse: (!) 111 (!) 103 (!) 128 (!) 124  Resp:   17   Temp:   (!) 97.5 F (36.4 C)   TempSrc:  Oral   SpO2:      Weight:      Height:        Examination:  GENERAL: No apparent distress.  Nontoxic. HEENT: MMM.  Vision and hearing grossly intact.  NECK: Supple.  No apparent JVD.  RESP:  No IWOB.  Fair aeration bilaterally. CVS: Irregular rhythm.  HR ranges 110s to 130s.  Heart sounds normal.  ABD/GI/GU: BS+. Abd soft, NTND.  MSK/EXT:  Moves  extremities. No apparent deformity. No edema.  SKIN: no apparent skin lesion or wound NEURO: AA.  Oriented appropriately.  No apparent focal neuro deficit. PSYCH: Calm. Normal affect.   Sch Meds:  Scheduled Meds:  apixaban   5 mg Oral BID   baclofen   10 mg Oral TID   Chlorhexidine  Gluconate Cloth  6 each Topical Daily   folic acid   1 mg Oral Daily   gabapentin   300 mg Oral TID   metoprolol  tartrate  100 mg Oral BID   multivitamin with minerals  1 tablet Oral Daily   mupirocin  ointment  1 Application Nasal BID   thiamine   100 mg Oral Daily   Or   thiamine   100 mg Intravenous Daily   Continuous Infusions:   PRN Meds:.LORazepam  **OR** LORazepam , metoprolol  tartrate, naphazoline-glycerin , polyethylene glycol, prochlorperazine   Antimicrobials: Anti-infectives (From admission, onward)    None        I have personally reviewed the following labs and images: CBC: Recent Labs  Lab 08/16/24 2002 08/17/24 0442 08/18/24 0510 08/19/24 0458  WBC 7.2 5.9 5.5 5.9  HGB 16.2 14.9 14.0 14.6  HCT 49.5 44.0 44.5 45.5  MCV 84.9 84.5 90.1 86.5  PLT 153 126* 112* 112*   BMP &GFR Recent Labs  Lab 08/16/24 2002 08/17/24 0442 08/17/24 1224 08/18/24 0510 08/18/24 0934 08/19/24 0956  NA 138 135 132*  --  137 139  K 3.8 3.8 4.3  --  4.2 4.3  CL 102 101 101  --  107 105  CO2 22 20* 21*  --  21* 21*  GLUCOSE 94 126* 158*  --  166* 136*  BUN 5* 6* 9  --  9 9  CREATININE 0.95 0.86 1.04  --  0.97 0.95  CALCIUM  8.7* 8.4* 8.3*  --  8.7* 8.9  MG 1.8  --   --  1.7  --  1.7  PHOS  --   --   --   --  2.6 2.6   Estimated Creatinine Clearance: 86.1 mL/min (by C-G formula based on SCr of 0.95 mg/dL). Liver & Pancreas: Recent Labs  Lab 08/17/24 1224 08/18/24 0934 08/19/24 0956  AST 32  --   --   ALT 23  --   --   ALKPHOS 72  --   --   BILITOT 1.7*  --   --   PROT 6.2*  --   --   ALBUMIN  2.8* 2.6* 2.8*   Recent Labs  Lab 08/16/24 2002  LIPASE 32   No results for input(s):  AMMONIA in the last 168 hours. Diabetic: Recent Labs    08/18/24 1215  HGBA1C 4.9   No results for input(s): GLUCAP in the last 168 hours. Cardiac Enzymes: No results for input(s): CKTOTAL, CKMB, CKMBINDEX, TROPONINI in the last 168 hours. No results for input(s): PROBNP in the last 8760 hours. Coagulation Profile: No results for input(s): INR, PROTIME in the last 168 hours. Thyroid  Function Tests: Recent Labs    08/16/24 2002 08/16/24 2030  TSH  --  4.470  FREET4 0.92  --    Lipid Profile: No results for input(s): CHOL, HDL, LDLCALC, TRIG, CHOLHDL, LDLDIRECT in the last 72 hours. Anemia Panel: No results for input(s): VITAMINB12, FOLATE, FERRITIN, TIBC, IRON, RETICCTPCT in the last 72 hours. Urine analysis:    Component Value Date/Time   COLORURINE STRAW (A) 04/11/2024 2342   APPEARANCEUR CLEAR 04/11/2024 2342   LABSPEC 1.002 (L) 04/11/2024 2342   PHURINE 6.0 04/11/2024 2342   GLUCOSEU NEGATIVE 04/11/2024 2342   HGBUR NEGATIVE 04/11/2024 2342   HGBUR negative 05/12/2010 1022   BILIRUBINUR NEGATIVE 04/11/2024 2342   KETONESUR 5 (A) 04/11/2024 2342   PROTEINUR NEGATIVE 04/11/2024 2342   UROBILINOGEN 1.0 03/17/2012 1530   NITRITE NEGATIVE 04/11/2024 2342   LEUKOCYTESUR NEGATIVE 04/11/2024 2342   Sepsis Labs: Invalid input(s): PROCALCITONIN, LACTICIDVEN  Microbiology: Recent Results (from the past 240 hours)  MRSA Next Gen by PCR, Nasal     Status: Abnormal   Collection Time: 08/17/24  3:48 AM   Specimen: Nasal Mucosa; Nasal Swab  Result Value Ref Range Status   MRSA by PCR Next Gen DETECTED (A) NOT DETECTED Final    Comment: RESULT CALLED TO, READ BACK BY AND VERIFIED WITH: A BUENDIA RN 08/17/2024 @ 9376 BY AB (NOTE) The GeneXpert MRSA Assay (FDA approved for NASAL specimens only), is one component of a comprehensive MRSA colonization surveillance program. It is not intended to diagnose MRSA infection nor to guide or  monitor treatment for MRSA infections. Test performance is not FDA approved in patients less than 64 years old. Performed at [facility], [address], [place], [place] [address]     Radiology Studies: No results found.     Anisha Starliper T. Annalia Metzger [facility]  If 7PM-7AM, please contact night-coverage [email or web address] 08/19/2024, 11:37 AM
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
## 2024-08-20 DIAGNOSIS — F1090 Alcohol use, unspecified, uncomplicated: Secondary | ICD-10-CM | POA: Diagnosis not present

## 2024-08-20 DIAGNOSIS — I4891 Unspecified atrial fibrillation: Secondary | ICD-10-CM | POA: Diagnosis not present

## 2024-08-20 LAB — RENAL FUNCTION PANEL
Albumin: 2.6 g/dL — ABNORMAL LOW (ref 3.5–5.0)
Anion gap: 12 (ref 5–15)
BUN: 14 mg/dL (ref 8–23)
CO2: 22 mmol/L (ref 22–32)
Calcium: 9.1 mg/dL (ref 8.9–10.3)
Chloride: 104 mmol/L (ref 98–111)
Creatinine, Ser: 1.08 mg/dL (ref 0.61–1.24)
GFR, Estimated: >60 mL/min (ref >60)
Glucose, Bld: 145 mg/dL — ABNORMAL HIGH (ref 70–99)
Phosphorus: 3.5 mg/dL (ref 2.5–4.6)
Potassium: 3.8 mmol/L (ref 3.5–5.1)
Sodium: 138 mmol/L (ref 135–145)

## 2024-08-20 LAB — CBC
HCT: 45.7 % (ref 39.0–52.0)
Hemoglobin: 14.6 g/dL (ref 13.0–17.0)
MCH: 28 pg (ref 26.0–34.0)
MCHC: 31.9 g/dL (ref 30.0–36.0)
MCV: 87.5 fL (ref 80.0–100.0)
Platelets: 125 K/uL — ABNORMAL LOW (ref 150–400)
RBC: 5.22 MIL/uL (ref 4.22–5.81)
RDW: 15.4 % (ref 11.5–15.5)
WBC: 5 K/uL (ref 4.0–10.5)
nRBC: 0 % (ref 0.0–0.2)

## 2024-08-20 LAB — MAGNESIUM: Magnesium: 1.7 mg/dL (ref 1.7–2.4)

## 2024-08-20 MED ORDER — LORAZEPAM 2 MG/ML IJ SOLN
1.0000 mg | INTRAMUSCULAR | Status: AC | PRN
  Administered 2024-08-20: 2 mg via INTRAVENOUS
  Filled 2024-08-20: qty 1

## 2024-08-20 MED ORDER — LORAZEPAM 1 MG PO TABS
1.0000 mg | ORAL_TABLET | ORAL | Status: AC | PRN
  Administered 2024-08-20: 1 mg via ORAL
  Filled 2024-08-20: qty 1

## 2024-08-20 MED ORDER — DIGOXIN 125 MCG PO TABS
0.1250 mg | ORAL_TABLET | Freq: Every day | ORAL | Status: AC
  Administered 2024-08-20 – 2024-08-24 (×5): 0.125 mg via ORAL
  Filled 2024-08-20 (×5): qty 1

## 2024-08-20 NOTE — Progress Notes (Addendum)
 Progress Note  Patient Name: [name] Date of Encounter: 08/20/2024 [facility] Cardiologist: None Has never followed up outpatient  Interval Summary   Reported headache, DOE, chest discomfort, and palpitations. Denied peripheral edema. Reported not taking medications prior to admission  Vital Signs Vitals:   08/19/24 1659 08/19/24 1958 08/20/24 0016 08/20/24 0524  BP: (!) 140/88 130/75 (!) 115/54 (!) 127/97  Pulse: 97 74 100 (!) 107  Resp: 20 18 18 18   Temp: 98 F (36.7 C) 98.6 F (37 C) 98.1 F (36.7 C) 98.2 F (36.8 C)  TempSrc: Oral Oral Oral Oral  SpO2: 99% 98% 98% 96%  Weight:    100.9 kg  Height:        Intake/Output Summary (Last 24 hours) at 08/20/2024 0736 Last data filed at 08/20/2024 0526 Gross per 24 hour  Intake 1436 ml  Output 100 ml  Net 1336 ml      08/20/2024    5:24 AM 08/19/2024    4:34 AM 08/17/2024    3:37 AM  Last 3 Weights  Weight (lbs) 222 lb 6.4 oz 223 lb 3.2 oz 223 lb 5.2 oz  Weight (kg) 100.88 kg 101.243 kg 101.3 kg      Telemetry/ECG  AF HR 105 yesterday, though RVR again this morning while up and moving ~135 - Personally Reviewed  Physical Exam  GEN: No acute distress.   Cardiac: Irregularly, irregular, no murmurs, rubs, or gallops.  Respiratory: Clear to auscultation bilaterally.  MS: No edema  Assessment & Plan  [name] is a 68 y.o. male with a hx of PAF not on AC, Hypertension, substance abuse, alcohol  abuse with liver cirrhosis, and depression who presented to the ED on 10/31 for SOB and chest pain. He was noted to be in AF RVR HR ~130s. Troponin negative. He was started on IV heparin  and IV dilt which was transitioned to oral metoprolol . He has remained in AF RVR, and cardiology was consulted for further management.  Persistent atrial fibrillation with episode of RVR Will avoid rhythm control at this time as patient has not been anticoagulated prior to presentation though could consider outpatient, his  alcohol  use and bleeding risk though complicates management.  Currently in AF RVR Chad Vasc score 6  He remains in AF RVR, though HR elevation this morning has correlated with ambulation so some aspect of physiologic response. Do not want to use amiodarone  with cirrhosis. Per UTD no added rate control benefit using >200 mg a day of metoprolol . Digoxin is not optimal given medication non-compliance and he has a significant history of self injury/suicidal ideation. Will discuss with Dr. Sheena about starting digoxin short term this admission to see if we can achieve rate control.  DCCV not option given issues with anticoagulation usage.   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  Imaging on admission shows no evidence of volume overload BNP no obtained On exam appears, euvolemic. Albumin  2.6  Continue metoprolol  as above Will hold on starting MRA/ARB as we continue to work on rate control as above.  Hypertension BP: 127/97 Medications as above  Per primary Substance use  Cirrhosis Depression    For questions or updates, please contact [facility] Please consult [email or web address] for contact info under  Signed, Leontine LOISE Salen, PA-C   Patient seen and examined, note reviewed with the signed Advanced Practice Provider. I personally reviewed laboratory data, imaging studies and relevant notes. I independently examined the patient and formulated the important aspects of the plan. I have personally discussed the plan with the patient and/or family. Comments or changes to the note/plan are indicated below.  Persistent atrial fibrillation with rapid ventricular rate Chronic heart failure with reduced ejection fraction thought to be  nonischemic Hypertension Substance abuse Cirrhosis  Heart rate still elevated in the 130s.  His beta-blocker was adjusted yesterday from metoprolol  to tartrate to metoprolol  succinate for help with this.  The goal is rate control and lenient rate control in the setting of his acute illness.  Will add digoxin to help while he is inpatient to keep the heart rate appropriately low.  Will need to get digoxin levels.  I am concerned about discharging him on this medication as he has not been reliant with follow-ups.  Seen for the anticoagulation for stroke prevention this has been asked tablets prior that he is not a great candidate to continue this in the outpatient setting.  Will continue to follow with you   Merve Hotard DO, MS [facility] Attending Cardiologist [facility]  [address] [place], [place] [address] [phone] Website: [email or web address]

## 2024-08-20 NOTE — Progress Notes (Signed)
 PROGRESS NOTE  [name] FMW:[id number] DOB: Apr 04, 1956   PCP: Delores Rojelio Caldron, NP  Patient is from: Homeless.  Lives in a tent.  DOA: 08/16/2024 LOS: 3  Chief complaints Chief Complaint  Patient presents with   Chest Pain     Brief Narrative / Interim history: 69 year old M with PMH of HFrEF, A-fib not on AC, HTN, alcoholic liver cirrhosis, alcohol  abuse and polysubstance use brought to ED by EMS with shortness of breath and chest pain, and admitted with atrial fibrillation with RVR.   In ED, in A-fib with RVR to 130.  Serial troponin negative x 2.  CXR without acute finding.  Started on Cardizem  drip and heparin  drip.  CT abdomen and pelvis ordered.   CT abdomen and pelvis suggested duodenitis.  TTE with LVEF of 40 to 45%.  Heart rate improved and he was transition to p.o. metoprolol  but back in A-fib with RVR again.  Cardiology consulted and adjusting meds to achieve rate control.  Subjective: Seen and examined earlier this morning.  No major events overnight or this morning.  Remains in RVR with HR ranging from 110s to 140s.  Assessment and plan: Persistent atrial fibrillation with RVR: Likely due to noncompliance and ongoing alcohol  use.  Drinks heavily.  He was in RVR to 130s on admission.  TSH normal.  TTE with LVEF of 40 to 45% (previously 35 to 40%), severe LAE.  CHA2DS2-VASc score 3 but not a candidate for anticoagulation due to ongoing alcohol  abuse. -Appreciate help by cardiology-Toprol -XL 100 mg twice daily.  Adding digoxin. -Continue Eliquis  for anticoagulation while in house.  -Extensively counseled on the importance of alcohol  cessation -Optimize electrolytes  Chronic HFrEF: TTE as above.  Appears euvolemic on exam.  CXR without acute finding.  Not on diuretics. -Started on Toprol -XL by cardiology -Encouraged alcohol  cessation -Strict intake and output, daily weight, renal functions and electrolytes  Alcohol  use disorder: Reports drinking about 5 of the  40 ounce beers a day.  CIWA score 2 (4 anxiety). -Encouraged alcohol  cessation. -Continue CIWA with as needed Ativan  -Continue home gabapentin . -Multivitamin, thiamine  and folic acid  -TOC for more counseling resources  Alcoholic liver cirrhosis without ascites: Appears compensated. - Encouraged alcohol  cessation.   Essential hypertension: Normotensive - Toprol -XL as above.  History of substance use: UDS negative.  Homelessness: Reportedly not interested in shelter and rather will live in his tent  Generalized body pain/chronic pain - Increased home baclofen  to 10 mg 3 times daily.  Thrombocytopenia: Mild.  Improving. - Continue monitoring  Class I obesity Body mass index is 32.84 kg/m.           DVT prophylaxis:  apixaban  (ELIQUIS ) tablet 5 mg  Code Status: Full code Family Communication: None at bedside Level of care: Telemetry Status is: Inpatient The patient will remain inpatient because: Atrial fibrillation with RVR   Final disposition: Back to his tent   35 minutes with more than 50% spent in reviewing records, counseling patient/family and coordinating care.  Consultants:  Cardiology  Procedures: None  Microbiology summarized: None  Objective: Vitals:   08/20/24 0016 08/20/24 0524 08/20/24 1017 08/20/24 1235  BP: (!) 115/54 (!) 127/97 (!) 129/97 136/80  Pulse: 100 (!) 107 (!) 127   Resp: 18 18  18   Temp: 98.1 F (36.7 C) 98.2 F (36.8 C)  97.6 F (36.4 C)  TempSrc: Oral Oral  Oral  SpO2: 98% 96%    Weight:  100.9 kg    Height:  Examination:  GENERAL: No apparent distress.  Nontoxic. HEENT: MMM.  Vision and hearing grossly intact.  NECK: Supple.  No apparent JVD.  RESP:  No IWOB.  Fair aeration bilaterally. CVS: Irregular rhythm.  HR ranges 110s to 130s.  Heart sounds normal.  ABD/GI/GU: BS+. Abd soft, NTND.  MSK/EXT:  Moves extremities. No apparent deformity. No edema.  SKIN: no apparent skin lesion or wound NEURO: AA.   Oriented appropriately.  No apparent focal neuro deficit. PSYCH: Calm. Normal affect.   Sch Meds:  Scheduled Meds:  apixaban   5 mg Oral BID   baclofen   10 mg Oral TID   Chlorhexidine  Gluconate Cloth  6 each Topical Daily   digoxin  0.125 mg Oral Daily   folic acid   1 mg Oral Daily   gabapentin   300 mg Oral TID   metoprolol  succinate  100 mg Oral Q12H   multivitamin with minerals  1 tablet Oral Daily   mupirocin  ointment  1 Application Nasal BID   thiamine   100 mg Oral Daily   Or   thiamine   100 mg Intravenous Daily   Continuous Infusions:   PRN Meds:.LORazepam  **OR** LORazepam , metoprolol  tartrate, naphazoline-glycerin , polyethylene glycol, prochlorperazine   Antimicrobials: Anti-infectives (From admission, onward)    None        I have personally reviewed the following labs and images: CBC: Recent Labs  Lab 08/16/24 2002 08/17/24 0442 08/18/24 0510 08/19/24 0458 08/20/24 0417  WBC 7.2 5.9 5.5 5.9 5.0  HGB 16.2 14.9 14.0 14.6 14.6  HCT 49.5 44.0 44.5 45.5 45.7  MCV 84.9 84.5 90.1 86.5 87.5  PLT 153 126* 112* 112* 125*   BMP &GFR Recent Labs  Lab 08/16/24 2002 08/17/24 0442 08/17/24 1224 08/18/24 0510 08/18/24 0934 08/19/24 0956 08/20/24 0417  NA 138 135 132*  --  137 139 138  K 3.8 3.8 4.3  --  4.2 4.3 3.8  CL 102 101 101  --  107 105 104  CO2 22 20* 21*  --  21* 21* 22  GLUCOSE 94 126* 158*  --  166* 136* 145*  BUN 5* 6* 9  --  9 9 14   CREATININE 0.95 0.86 1.04  --  0.97 0.95 1.08  CALCIUM  8.7* 8.4* 8.3*  --  8.7* 8.9 9.1  MG 1.8  --   --  1.7  --  1.7 1.7  PHOS  --   --   --   --  2.6 2.6 3.5   Estimated Creatinine Clearance: 75.6 mL/min (by C-G formula based on SCr of 1.08 mg/dL). Liver & Pancreas: Recent Labs  Lab 08/17/24 1224 08/18/24 0934 08/19/24 0956 08/20/24 0417  AST 32  --   --   --   ALT 23  --   --   --   ALKPHOS 72  --   --   --   BILITOT 1.7*  --   --   --   PROT 6.2*  --   --   --   ALBUMIN  2.8* 2.6* 2.8* 2.6*   Recent  Labs  Lab 08/16/24 2002  LIPASE 32   No results for input(s): AMMONIA in the last 168 hours. Diabetic: Recent Labs    08/18/24 1215  HGBA1C 4.9   No results for input(s): GLUCAP in the last 168 hours. Cardiac Enzymes: No results for input(s): CKTOTAL, CKMB, CKMBINDEX, TROPONINI in the last 168 hours. No results for input(s): PROBNP in the last 8760 hours. Coagulation Profile: No results for input(s): INR, PROTIME  in the last 168 hours. Thyroid  Function Tests: No results for input(s): TSH, T4TOTAL, FREET4, T3FREE, THYROIDAB in the last 72 hours.  Lipid Profile: No results for input(s): CHOL, HDL, LDLCALC, TRIG, CHOLHDL, LDLDIRECT in the last 72 hours. Anemia Panel: No results for input(s): VITAMINB12, FOLATE, FERRITIN, TIBC, IRON, RETICCTPCT in the last 72 hours. Urine analysis:    Component Value Date/Time   COLORURINE STRAW (A) 04/11/2024 2342   APPEARANCEUR CLEAR 04/11/2024 2342   LABSPEC 1.002 (L) 04/11/2024 2342   PHURINE 6.0 04/11/2024 2342   GLUCOSEU NEGATIVE 04/11/2024 2342   HGBUR NEGATIVE 04/11/2024 2342   HGBUR negative 05/12/2010 1022   BILIRUBINUR NEGATIVE 04/11/2024 2342   KETONESUR 5 (A) 04/11/2024 2342   PROTEINUR NEGATIVE 04/11/2024 2342   UROBILINOGEN 1.0 03/17/2012 1530   NITRITE NEGATIVE 04/11/2024 2342   LEUKOCYTESUR NEGATIVE 04/11/2024 2342   Sepsis Labs: Invalid input(s): PROCALCITONIN, LACTICIDVEN  Microbiology: Recent Results (from the past 240 hours)  MRSA Next Gen by PCR, Nasal     Status: Abnormal   Collection Time: 08/17/24  3:48 AM   Specimen: Nasal Mucosa; Nasal Swab  Result Value Ref Range Status   MRSA by PCR Next Gen DETECTED (A) NOT DETECTED Final    Comment: RESULT CALLED TO, READ BACK BY AND VERIFIED WITH: A BUENDIA RN 08/17/2024 @ 9376 BY AB (NOTE) The GeneXpert MRSA Assay (FDA approved for NASAL specimens only), is one component of a comprehensive MRSA colonization  surveillance program. It is not intended to diagnose MRSA infection nor to guide or monitor treatment for MRSA infections. Test performance is not FDA approved in patients less than 5 years old. Performed at [facility], [address] [place], [place] [address]     Radiology Studies: No results found.     Norris Brumbach T. Yamila Cragin [facility]  If 7PM-7AM, please contact night-coverage [email or web address] 08/20/2024, 1:54 PM

## 2024-08-20 NOTE — Plan of Care (Signed)
  Problem: Education: Goal: Knowledge of General Education information will improve Description: Including pain rating scale, medication(s)/side effects and non-pharmacologic comfort measures Outcome: Progressing   Problem: Health Behavior/Discharge Planning: Goal: Ability to manage health-related needs will improve Outcome: Progressing   Problem: Clinical Measurements: Goal: Will remain free from infection Outcome: Progressing   Problem: Clinical Measurements: Goal: Ability to maintain clinical measurements within normal limits will improve Outcome: Progressing   Problem: Coping: Goal: Level of anxiety will decrease Outcome: Progressing   Problem: Skin Integrity: Goal: Risk for impaired skin integrity will decrease Outcome: Progressing   Problem: Safety: Goal: Ability to remain free from injury will improve Outcome: Progressing

## 2024-08-20 NOTE — Plan of Care (Signed)

## 2024-08-21 DIAGNOSIS — K7030 Alcoholic cirrhosis of liver without ascites: Secondary | ICD-10-CM | POA: Diagnosis not present

## 2024-08-21 DIAGNOSIS — R935 Abnormal findings on diagnostic imaging of other abdominal regions, including retroperitoneum: Secondary | ICD-10-CM

## 2024-08-21 DIAGNOSIS — D696 Thrombocytopenia, unspecified: Secondary | ICD-10-CM

## 2024-08-21 DIAGNOSIS — F1910 Other psychoactive substance abuse, uncomplicated: Secondary | ICD-10-CM | POA: Insufficient documentation

## 2024-08-21 DIAGNOSIS — I5022 Chronic systolic (congestive) heart failure: Secondary | ICD-10-CM

## 2024-08-21 DIAGNOSIS — R1013 Epigastric pain: Secondary | ICD-10-CM | POA: Diagnosis not present

## 2024-08-21 DIAGNOSIS — I4891 Unspecified atrial fibrillation: Secondary | ICD-10-CM | POA: Diagnosis not present

## 2024-08-21 DIAGNOSIS — Z5900 Homelessness unspecified: Secondary | ICD-10-CM

## 2024-08-21 DIAGNOSIS — K2980 Duodenitis without bleeding: Secondary | ICD-10-CM

## 2024-08-21 LAB — CBC
HCT: 46.9 % (ref 39.0–52.0)
Hemoglobin: 15.4 g/dL (ref 13.0–17.0)
MCH: 28.1 pg (ref 26.0–34.0)
MCHC: 32.8 g/dL (ref 30.0–36.0)
MCV: 85.6 fL (ref 80.0–100.0)
Platelets: 151 K/uL (ref 150–400)
RBC: 5.48 MIL/uL (ref 4.22–5.81)
RDW: 15.2 % (ref 11.5–15.5)
WBC: 6.4 K/uL (ref 4.0–10.5)
nRBC: 0 % (ref 0.0–0.2)

## 2024-08-21 LAB — RENAL FUNCTION PANEL
Albumin: 2.9 g/dL — ABNORMAL LOW (ref 3.5–5.0)
Anion gap: 11 (ref 5–15)
BUN: 19 mg/dL (ref 8–23)
CO2: 23 mmol/L (ref 22–32)
Calcium: 9.1 mg/dL (ref 8.9–10.3)
Chloride: 101 mmol/L (ref 98–111)
Creatinine, Ser: 0.94 mg/dL (ref 0.61–1.24)
GFR, Estimated: >60 mL/min (ref >60)
Glucose, Bld: 116 mg/dL — ABNORMAL HIGH (ref 70–99)
Phosphorus: 4.4 mg/dL (ref 2.5–4.6)
Potassium: 4.5 mmol/L (ref 3.5–5.1)
Sodium: 135 mmol/L (ref 135–145)

## 2024-08-21 LAB — TROPONIN I (HIGH SENSITIVITY): Troponin I (High Sensitivity): 5 ng/L (ref <18)

## 2024-08-21 LAB — MAGNESIUM: Magnesium: 1.8 mg/dL (ref 1.7–2.4)

## 2024-08-21 MED ORDER — PANTOPRAZOLE SODIUM 40 MG IV SOLR
40.0000 mg | Freq: Two times a day (BID) | INTRAVENOUS | Status: DC
  Administered 2024-08-21 – 2024-08-23 (×6): 40 mg via INTRAVENOUS
  Filled 2024-08-21 (×6): qty 10

## 2024-08-21 MED ORDER — ALUM & MAG HYDROXIDE-SIMETH 200-200-20 MG/5ML PO SUSP
30.0000 mL | ORAL | Status: DC | PRN
  Administered 2024-08-21: 30 mL via ORAL
  Filled 2024-08-21: qty 30

## 2024-08-21 MED ORDER — ACETAMINOPHEN 325 MG PO TABS
650.0000 mg | ORAL_TABLET | Freq: Once | ORAL | Status: AC | PRN
  Administered 2024-08-21: 650 mg via ORAL
  Filled 2024-08-21: qty 2

## 2024-08-21 MED ORDER — PANTOPRAZOLE SODIUM 40 MG PO TBEC: 40.0000 mg | DELAYED_RELEASE_TABLET | Freq: Two times a day (BID) | ORAL | Status: DC

## 2024-08-21 MED ORDER — OXYCODONE HCL 5 MG PO TABS
5.0000 mg | ORAL_TABLET | Freq: Once | ORAL | Status: AC | PRN
  Administered 2024-08-22: 5 mg via ORAL
  Filled 2024-08-21: qty 1

## 2024-08-21 MED ORDER — NALOXONE HCL 0.4 MG/ML IJ SOLN
0.4000 mg | INTRAMUSCULAR | Status: DC | PRN
Start: 2024-08-21 — End: 2024-08-27

## 2024-08-21 MED ORDER — MELATONIN 3 MG PO TABS
3.0000 mg | ORAL_TABLET | Freq: Every evening | ORAL | Status: DC | PRN
  Administered 2024-08-21 – 2024-08-25 (×6): 3 mg via ORAL
  Filled 2024-08-21 (×7): qty 1

## 2024-08-21 MED ORDER — FAMOTIDINE IN NACL 20-0.9 MG/50ML-% IV SOLN
20.0000 mg | Freq: Once | INTRAVENOUS | Status: AC
  Administered 2024-08-21: 20 mg via INTRAVENOUS
  Filled 2024-08-21: qty 50

## 2024-08-21 MED ORDER — MAGNESIUM SULFATE 2 GM/50ML IV SOLN
2.0000 g | Freq: Once | INTRAVENOUS | Status: AC
  Administered 2024-08-21: 2 g via INTRAVENOUS
  Filled 2024-08-21: qty 50
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
## 2024-08-21 NOTE — Progress Notes (Addendum)
 Progress Note  Patient Name: [name] Date of Encounter: 08/21/2024  Primary Cardiologist: Previously Dr. Alveta; in 2024 admission also seen by Dr. Kate so will assign to Dr. Kate  Subjective   Reports ongoing sense of generalized chest pressure all night long without relief, feels his heart racing/pounding HR 95-110. Reports this is the same sensation that brought him into the hospital in the first place, but not as severe. Has never fully gone away since admission. No nausea, vomiting, dyspnea, edema. Not worse with inspiration or movement.  Inpatient Medications    Scheduled Meds:  apixaban   5 mg Oral BID   baclofen   10 mg Oral TID   Chlorhexidine  Gluconate Cloth  6 each Topical Daily   digoxin  0.125 mg Oral Daily   folic acid   1 mg Oral Daily   gabapentin   300 mg Oral TID   metoprolol  succinate  100 mg Oral Q12H   multivitamin with minerals  1 tablet Oral Daily   mupirocin  ointment  1 Application Nasal BID   thiamine   100 mg Oral Daily   Or   thiamine   100 mg Intravenous Daily   Continuous Infusions:  PRN Meds: LORazepam  **OR** LORazepam , melatonin, metoprolol  tartrate, naphazoline-glycerin , polyethylene glycol, prochlorperazine    Vital Signs    Vitals:   08/20/24 1724 08/20/24 2036 08/20/24 2129 08/21/24 0431  BP: 126/70 (!) 137/105 (!) 99/55 115/86  Pulse: (!) 52  90 89  Resp: 18 20  20   Temp: 97.8 F (36.6 C) (!) 97.5 F (36.4 C)  97.9 F (36.6 C)  TempSrc: Oral Oral  Oral  SpO2: 97%   97%  Weight:    101.8 kg  Height:        Intake/Output Summary (Last 24 hours) at 08/21/2024 0805 Last data filed at 08/21/2024 0433 Gross per 24 hour  Intake 477 ml  Output 1350 ml  Net -873 ml      08/21/2024    4:31 AM 08/20/2024    5:24 AM 08/19/2024    4:34 AM  Last 3 Weights  Weight (lbs) 224 lb 6.9 oz 222 lb 6.4 oz 223 lb 3.2 oz  Weight (kg) 101.8 kg 100.88 kg 101.243 kg     Telemetry    Atrial fib 90s-110s, mainly around 105 resting-  Personally Reviewed  ECG    Repeat ordered - Personally Reviewed  Physical Exam   GEN: No acute distress.  HEENT: Normocephalic, atraumatic, sclera non-icteric. Neck: No JVD or bruits. Cardiac: Irregularly irregular, rate borderline elevated, no murmurs, rubs, or gallops.  Respiratory: Clear to auscultation bilaterally. Breathing is unlabored. Lying flat in bed without dyspnea GI: Soft, nontender, non-distended, BS +x 4. MS: no deformity. Extremities: No clubbing or cyanosis. No edema. Distal pedal pulses are 2+ and equal bilaterally. Neuro:  AAOx3. Follows commands. Psych:  Responds to questions appropriately with a normal affect.  Labs    High Sensitivity Troponin:   Recent Labs  Lab 08/05/24 1229 08/05/24 1407 08/16/24 2002 08/16/24 2216  TROPONINIHS 6 6 6 6       Cardiac EnzymesNo results for input(s): TROPONINI in the last 168 hours. No results for input(s): TROPIPOC in the last 168 hours.   Chemistry Recent Labs  Lab 08/17/24 1224 08/18/24 0934 08/19/24 0956 08/20/24 0417 08/21/24 0459  NA 132*   < > 139 138 135  K 4.3   < > 4.3 3.8 4.5  CL 101   < > 105 104 101  CO2 21*   < >  21* 22 23  GLUCOSE 158*   < > 136* 145* 116*  BUN 9   < > 9 14 19   CREATININE 1.04   < > 0.95 1.08 0.94  CALCIUM  8.3*   < > 8.9 9.1 9.1  PROT 6.2*  --   --   --   --   ALBUMIN  2.8*   < > 2.8* 2.6* 2.9*  AST 32  --   --   --   --   ALT 23  --   --   --   --   ALKPHOS 72  --   --   --   --   BILITOT 1.7*  --   --   --   --   GFRNONAA >60   < > >60 >60 >60  ANIONGAP 10   < > 13 12 11    < > = values in this interval not displayed.     Hematology Recent Labs  Lab 08/19/24 0458 08/20/24 0417 08/21/24 0459  WBC 5.9 5.0 6.4  RBC 5.26 5.22 5.48  HGB 14.6 14.6 15.4  HCT 45.5 45.7 46.9  MCV 86.5 87.5 85.6  MCH 27.8 28.0 28.1  MCHC 32.1 31.9 32.8  RDW 15.7* 15.4 15.2  PLT 112* 125* 151    BNPNo results for input(s): BNP, PROBNP in the last 168 hours.   DDimer No  results for input(s): DDIMER in the last 168 hours.   Radiology    No results found.  Cardiac Studies   2d echo 08/2024   1. No LV thrombus by Definity . Left ventricular ejection fraction, by  estimation, is 40 to 45%. The left ventricle has mildly decreased  function. Left ventricular endocardial border not optimally defined to  evaluate regional wall motion. There is mild  left ventricular hypertrophy. Left ventricular diastolic function could  not be evaluated.   2. Right ventricular systolic function is moderately reduced. The right  ventricular size is moderately enlarged. Tricuspid regurgitation signal is  inadequate for assessing PA pressure.   3. Left atrial size was severely dilated.   4. Right atrial size was moderately dilated.   5. The mitral valve is abnormal. Mild mitral valve regurgitation. No  evidence of mitral stenosis. Moderate mitral annular calcification.   6. The aortic valve has an indeterminant number of cusps. Aortic valve  regurgitation is not visualized. No aortic stenosis is present.   Comparison(s): Changes from prior study are noted.    Patient Profile     68 y.o. male with PAF not on AC, ongoing alcohol  abuse with liver cirrhosis, hypertension, substance abuse, chronic HFrEF, mitral regurgitation by prior echo, severe depression with prior suicide attempts, chronic pain, obesity, homelessness. Previously seen in 09/2023-10/2023 with new onset AFib in setting of ETOH, EF 35-40% at that time, no follow-up. Recently admitted here on 08/05/2024 for A-fib with RVR and alcohol  intoxication.  Patient was managed and discharged to his tent, not interested in shelter at that time. Returned to the hospital 08/16/2024 with SOB, chest pressure without overt pain, and vomiting. Found to have AF RVR and duodenitis by CT. Repeat echo with EF 40-45%, mild LVH, moderately reduced RV function, moderately enlarged RV, severe LAE, moderate RAE, mild MR.   Assessment &  Plan    1. Persistent atrial fibrillation with RVR, continued chest pressure since admission - admitted with chest pressure, SOB, vomiting -> initial hsTrops negative, therefore not felt to reflect ACS - TSH OK, - UDS neg, + ongoing ETOH -  echo findings as above - historically anticoagulation avoided in setting of heavy alcohol  abuse, cirrhosis, did not attend follow-up, difficult social situation with homelessness -> was placed on this inpatient but Dr. Sheena did not feel he should be discharged on OAC. IM team has discontinued in setting of heavy NSAID/ETOH use and ongoing duodenitis, agree - continues with chest pressure overnight with sensation of elevated heart rate - on Toprol  100mg  BID, with addition of digoxin 0.125mg  daily starting 08/20/24, got dose already early this AM - will obtain f/u EKG and troponin now, MD to review potential intensification of formal digoxin  - defer narcotic analgesia to primary team given chronic pain/substance use issues - given ETOH/duodenitis, will trial dose of IV Pepcid  and add PPI for chest discomfort - IM is getting GI to see - addendum: EKG shows atrial fib 106bpm, no acute STT changes. Mg 1.8 -> pharmD repleting. hsTroponin negative arguing against ischemic etiology of chest pain (would expect to see some rise given chronicity of days' worth of symptoms at this juncture)  2. Chronic biventricular HFrEF - etiology of cardiomyopathy not defined, but suspected tachymediated vs ETOH, can explore ischemic evaluation as outpatient if social conditions improve - variable BP at times will not allow for aggressive GDMT, would prioritize rate control of #1 - volume status looks OK  3. Mild mitral regurgitation - no acute intervention needed  4. Essential HTN - manage in context above  Per primary: Substance use/ETOH abuse Cirrhosis Depression Duodenitis Hypoalbuminemia Thrombocytopenia (improved)  For questions or updates, please contact [facility] Please consult [email or web address] for contact info under Cardiology/STEMI.  Signed, Dayna N Dunn, PA-C 08/21/2024, 8:05 AM    Patient seen and examined, note reviewed with the signed Advanced Practice Provider. I personally reviewed laboratory data, imaging studies and relevant notes. I independently examined the patient and formulated the important aspects of the plan. I have personally discussed the plan with the patient and/or family. Comments or changes to the note/plan are indicated below.  Chest pain has not recurred.  Troponin is negative.  No need to pursue any ischemic evaluation here. In terms of his atrial fibrillation he is on Toprol -XL 100 twice daily with added digoxin.  The concern with discharging on digoxin is to follow-up in to get blood work.  He has been agreed prior that he will be off of anticoagulation in the setting of his alcohol  use and concern for risk of bleeding.  Unfortunately his blood pressure does not allow for optimization of his medications in terms of guideline directed medical therapy.   Camyah Pultz DO, MS [facility] Attending Cardiologist [facility]  [address] [place], [place] [address] [phone] Website: [email or web address]

## 2024-08-21 NOTE — Progress Notes (Signed)
 [facility] night cross cover note:   I was notified by RN of the patient's request for a sleep aid. I subsequently placed order for prn melatonin for insomnia.     Newton Pigg, DO Hospitalist

## 2024-08-21 NOTE — Progress Notes (Signed)
 [facility] PROGRESS NOTE    Progress Note  RATHANA VIVEROS  FMW:[id number] DOB: 06-18-1956 DOA: 08/16/2024 PCP: Delores Rojelio Caldron, NP     Brief Narrative:   [name] LITSEY is an 68 y.o. male past medical history of HFrEF chronic atrial fibrillation not on anticoagulation, alcoholic liver cirrhosis polysubstance abuse brought in by EMS for shortness of breath and chest pain was found to be in A-fib with RVR started on IV Cardizem  IV heparin  drip. CT scan of the abdomen pelvis showed duodenitis.  2D echo showed an EF of 40% cardiology was consulted.  The patient has also been complaining of epigastric pain he consumes Motrin  about 6 times a day he has done that for years.   Assessment/Plan:   Atrial fibrillation with rapid ventricular response (HCC) Likely due to noncompliance with his medication. Now on Toprol -XL digoxin. And Eliquis  while in house.  UDS negative TSH unremarkable. Alcohol  abuse seems to be ongoing. Cardiology to continue to titrate medications. Will discuss with cardiology to hold Eliquis .  Chronic HFrEF: Chest x-ray showed no acute findings. 2D echo was done that showed an EF of 40% Continue Toprol .  Duodenitis seen on CT, Start on PPI, no alarming symptoms hemoglobin is stable. Denies any melanotic or black stools, consumes Motrin  about 6 times a day he has done that for years alcohol  regularly. He continues to have abdominal pain. Will go ahead and consult gastroenterology  Alcohol  use: Continue gabapentin . Monitor with CIWA protocol.  Alcoholic liver cirrhosis: Counseling.  Essential hypertension: Continue metoprolol .  History of substance abuse: UDS unremarkable.  Thrombocytopenia: Return to alcohol  abuse.   DVT prophylaxis: Eliquis  Family Communication:none Status is: Inpatient Remains inpatient appropriate because: Persistent atrial fibrillation    Code Status:     Code Status Orders  (From admission, onward)            Start     Ordered   08/16/24 2358  Full code  Continuous       Question:  By:  Answer:  Consent: discussion documented in EHR   08/16/24 2358           Code Status History     Date Active Date Inactive Code Status Order ID Comments User Context   08/05/2024 1759 08/06/2024 2225 Full Code [id number]  Jonel Lonni SQUIBB, MD ED   05/03/2024 1622 05/31/2024 2101 Full Code [id number]  Wilkie Majel RAMAN, FNP Inpatient   05/01/2024 0206 05/03/2024 1620 Full Code [id number]  Dasie Leonor CROME, MD Inpatient   10/18/2023 2315 10/22/2023 1543 Full Code [id number]  Silvester Ales, MD ED   10/05/2023 1404 10/16/2023 1809 Full Code [id number]  Zella Katha HERO, MD ED   07/02/2023 0114 07/05/2023 2024 Full Code [id number]  Lee Kingfisher, MD ED   07/01/2023 1055 07/01/2023 1835 Full Code [id number]  Randall Starlyn HERO, NP ED   07/01/2023 1036 07/01/2023 1055 Full Code [id number]  Randall Starlyn HERO, NP ED   06/30/2023 2225 07/01/2023 1013 Full Code [id number]  Green Roxianne BRAVO, NP ED   05/07/2023 1021 05/12/2023 2116 Full Code [id number]  Ezzard Staci SAILOR, NP ED   05/07/2023 0051 05/07/2023 1017 Full Code [id number]  Mardi Kathryne LABOR, NP ED   05/06/2023 1043 05/06/2023 1536 Full Code [id number]  Randall Starlyn HERO, NP ED   02/24/2023 0016 04/03/2023 2230 Full Code [id number]  Kinsinger, Herlene Righter, MD Inpatient   01/24/2023 1524 01/24/2023 2107 Full Code [id number]  Darra Fonda MATSU, MD ED   01/10/2023 0022 01/12/2023 1427 Full  Code [id number]  Trudy Carwin, NP ED   01/09/2023 1707 01/09/2023 2358 Full Code [id number]  Adolph Tinnie BRAVO, PA-C ED   07/06/2022 1626 07/08/2022 1828 Full Code [id number]  Elner Luisa NOVAK, NP ED   06/10/2022 1243 06/15/2022 1517 Full Code [id number]  Teresa Wyline CROME, NP ED   05/30/2022 1019 06/01/2022 2223 Full Code [id number]  Marry Clamp, MD ED   05/29/2022 1954 05/30/2022 1019 Full Code [id number]  Mardi Kathryne LABOR, NP ED   05/24/2022 2100 05/25/2022 1833 Full Code [id number]  Alan Thurman GAILS, NP ED    05/10/2022 1414 05/11/2022 1703 Full Code [id number]  Melvenia Motto, MD ED   05/06/2022 0946 05/06/2022 1300 Full Code [id number]  Levander Houston, MD ED   04/20/2022 1903 05/03/2022 2313 Full Code [id number]  Stechschulte, Deward PARAS, MD ED   03/31/2022 2256 04/02/2022 2014 Full Code [id number]  Long, Fonda MATSU, MD ED   01/22/2022 1349 01/22/2022 1926 Full Code [id number]  Eudelia Maude SAUNDERS, PA-C ED   01/05/2022 1358 01/06/2022 1603 Full Code [id number]  Mertha Lum LABOR, PA-C ED   02/07/2021 2310 02/11/2021 2019 Full Code [id number]  Franchester Snare, MD ED   01/20/2021 2051 01/26/2021 1608 Full Code [id number]  Doretha Folks, MD ED   12/01/2020 1905 01/18/2021 2312 Full Code [id number]  Stechschulte, Deward PARAS, MD ED   11/24/2020 2254 11/26/2020 1331 Full Code [id number]  Beverley Leita LABOR, PA-C ED   03/17/2012 1740 03/20/2012 1121 Full Code [id number]  Margrette Pear, MD ED         IV Access:   Peripheral IV   Procedures and diagnostic studies:   No results found.   Medical Consultants:   None.   Subjective:    [name] complaining of epigastric abdominal pain  Objective:    Vitals:   08/20/24 2036 08/20/24 2129 08/21/24 0431 08/21/24 0910  BP: (!) 137/105 (!) 99/55 115/86   Pulse:  90 89   Resp: 20  20 17   Temp: (!) 97.5 F (36.4 C)  97.9 F (36.6 C) 97.7 F (36.5 C)  TempSrc: Oral  Oral Oral  SpO2:   97%   Weight:   101.8 kg   Height:       SpO2: 97 %   Intake/Output Summary (Last 24 hours) at 08/21/2024 0935 Last data filed at 08/21/2024 0433 Gross per 24 hour  Intake 477 ml  Output 1350 ml  Net -873 ml   Filed Weights   08/19/24 0434 08/20/24 0524 08/21/24 0431  Weight: 101.2 kg 100.9 kg 101.8 kg    Exam: General exam: In no acute distress. Respiratory system: Good air movement and clear to auscultation. Cardiovascular system: S1 & S2 heard, RRR. No JVD. Gastrointestinal system: Abdomen is nondistended, soft and nontender.  Extremities: No pedal edema. Skin: No rashes,  lesions or ulcers Psychiatry: Judgement and insight appear normal. Mood & affect appropriate.    Data Reviewed:    Labs: Basic Metabolic Panel: Recent Labs  Lab 08/16/24 2002 08/17/24 0442 08/17/24 1224 08/18/24 0510 08/18/24 0934 08/19/24 0956 08/20/24 0417 08/21/24 0459  NA 138   < > 132*  --  137 139 138 135  K 3.8   < > 4.3  --  4.2 4.3 3.8 4.5  CL 102   < > 101  --  107 105 104 101  CO2 22   < > 21*  --  21* 21* 22 23  GLUCOSE 94   < > 158*  --  166* 136*  145* 116*  BUN 5*   < > 9  --  9 9 14 19   CREATININE 0.95   < > 1.04  --  0.97 0.95 1.08 0.94  CALCIUM  8.7*   < > 8.3*  --  8.7* 8.9 9.1 9.1  MG 1.8  --   --  1.7  --  1.7 1.7 1.8  PHOS  --   --   --   --  2.6 2.6 3.5 4.4   < > = values in this interval not displayed.   GFR Estimated Creatinine Clearance: 87.2 mL/min (by C-G formula based on SCr of 0.94 mg/dL). Liver Function Tests: Recent Labs  Lab 08/17/24 1224 08/18/24 0934 08/19/24 0956 08/20/24 0417 08/21/24 0459  AST 32  --   --   --   --   ALT 23  --   --   --   --   ALKPHOS 72  --   --   --   --   BILITOT 1.7*  --   --   --   --   PROT 6.2*  --   --   --   --   ALBUMIN  2.8* 2.6* 2.8* 2.6* 2.9*   Recent Labs  Lab 08/16/24 2002  LIPASE 32   No results for input(s): AMMONIA in the last 168 hours. Coagulation profile No results for input(s): INR, PROTIME in the last 168 hours. COVID-19 Labs  No results for input(s): DDIMER, FERRITIN, LDH, CRP in the last 72 hours.  Lab Results  Component Value Date   SARSCOV2NAA NEGATIVE 05/02/2024   SARSCOV2NAA NEGATIVE 10/05/2023   SARSCOV2NAA NEGATIVE 07/01/2023   SARSCOV2NAA NEGATIVE 01/09/2023    CBC: Recent Labs  Lab 08/17/24 0442 08/18/24 0510 08/19/24 0458 08/20/24 0417 08/21/24 0459  WBC 5.9 5.5 5.9 5.0 6.4  HGB 14.9 14.0 14.6 14.6 15.4  HCT 44.0 44.5 45.5 45.7 46.9  MCV 84.5 90.1 86.5 87.5 85.6  PLT 126* 112* 112* 125* 151   Cardiac Enzymes: No results for input(s):  CKTOTAL, CKMB, CKMBINDEX, TROPONINI in the last 168 hours. BNP (last 3 results) No results for input(s): PROBNP in the last 8760 hours. CBG: No results for input(s): GLUCAP in the last 168 hours. D-Dimer: No results for input(s): DDIMER in the last 72 hours. Hgb A1c: Recent Labs    08/18/24 1215  HGBA1C 4.9   Lipid Profile: No results for input(s): CHOL, HDL, LDLCALC, TRIG, CHOLHDL, LDLDIRECT in the last 72 hours. Thyroid  function studies: No results for input(s): TSH, T4TOTAL, T3FREE, THYROIDAB in the last 72 hours.  Invalid input(s): FREET3 Anemia work up: No results for input(s): VITAMINB12, FOLATE, FERRITIN, TIBC, IRON, RETICCTPCT in the last 72 hours. Sepsis Labs: Recent Labs  Lab 08/18/24 0510 08/19/24 0458 08/20/24 0417 08/21/24 0459  WBC 5.5 5.9 5.0 6.4   Microbiology Recent Results (from the past 240 hours)  MRSA Next Gen by PCR, Nasal     Status: Abnormal   Collection Time: 08/17/24  3:48 AM   Specimen: Nasal Mucosa; Nasal Swab  Result Value Ref Range Status   MRSA by PCR Next Gen DETECTED (A) NOT DETECTED Final    Comment: RESULT CALLED TO, READ BACK BY AND VERIFIED WITH: A BUENDIA RN 08/17/2024 @ 9376 BY AB (NOTE) The GeneXpert MRSA Assay (FDA approved for NASAL specimens only), is one component of a comprehensive MRSA colonization surveillance program. It is not intended to diagnose MRSA infection nor to guide or monitor treatment for MRSA infections. Test performance  is not FDA approved in patients less than 62 years old. Performed at [facility], [address] [place], [place] [address]      Medications:    apixaban   5 mg Oral BID   baclofen   10 mg Oral TID   Chlorhexidine  Gluconate Cloth  6 each Topical Daily   digoxin  0.125 mg Oral Daily   folic acid   1 mg Oral Daily   gabapentin   300 mg Oral TID   metoprolol  succinate  100 mg Oral Q12H   multivitamin with minerals  1 tablet Oral  Daily   mupirocin  ointment  1 Application Nasal BID   pantoprazole   40 mg Oral BID   thiamine   100 mg Oral Daily   Or   thiamine   100 mg Intravenous Daily   Continuous Infusions:  famotidine  (PEPCID ) IV        LOS: 4 days   Erle Odell Castor  [facility]  08/21/2024, 9:35 AM

## 2024-08-22 ENCOUNTER — Inpatient Hospital Stay (HOSPITAL_COMMUNITY)

## 2024-08-22 DIAGNOSIS — K7030 Alcoholic cirrhosis of liver without ascites: Secondary | ICD-10-CM | POA: Diagnosis not present

## 2024-08-22 DIAGNOSIS — R935 Abnormal findings on diagnostic imaging of other abdominal regions, including retroperitoneum: Secondary | ICD-10-CM | POA: Diagnosis not present

## 2024-08-22 DIAGNOSIS — K2980 Duodenitis without bleeding: Secondary | ICD-10-CM | POA: Diagnosis not present

## 2024-08-22 DIAGNOSIS — I4891 Unspecified atrial fibrillation: Secondary | ICD-10-CM | POA: Diagnosis not present

## 2024-08-22 LAB — CBC
HCT: 48.1 % (ref 39.0–52.0)
Hemoglobin: 15.7 g/dL (ref 13.0–17.0)
MCH: 27.8 pg (ref 26.0–34.0)
MCHC: 32.6 g/dL (ref 30.0–36.0)
MCV: 85.3 fL (ref 80.0–100.0)
Platelets: 141 K/uL — ABNORMAL LOW (ref 150–400)
RBC: 5.64 MIL/uL (ref 4.22–5.81)
RDW: 15 % (ref 11.5–15.5)
WBC: 6.2 K/uL (ref 4.0–10.5)
nRBC: 0 % (ref 0.0–0.2)

## 2024-08-22 LAB — COMPREHENSIVE METABOLIC PANEL WITH GFR
ALT: 25 U/L (ref 0–44)
AST: 30 U/L (ref 15–41)
Albumin: 2.8 g/dL — ABNORMAL LOW (ref 3.5–5.0)
Alkaline Phosphatase: 63 U/L (ref 38–126)
Anion gap: 14 (ref 5–15)
BUN: 19 mg/dL (ref 8–23)
CO2: 19 mmol/L — ABNORMAL LOW (ref 22–32)
Calcium: 8.6 mg/dL — ABNORMAL LOW (ref 8.9–10.3)
Chloride: 105 mmol/L (ref 98–111)
Creatinine, Ser: 0.91 mg/dL (ref 0.61–1.24)
GFR, Estimated: >60 mL/min (ref >60)
Glucose, Bld: 95 mg/dL (ref 70–99)
Potassium: 4.7 mmol/L (ref 3.5–5.1)
Sodium: 138 mmol/L (ref 135–145)
Total Bilirubin: 0.8 mg/dL (ref 0.0–1.2)
Total Protein: 6.8 g/dL (ref 6.5–8.1)

## 2024-08-22 LAB — MAGNESIUM: Magnesium: 1.9 mg/dL (ref 1.7–2.4)

## 2024-08-22 LAB — DIGOXIN LEVEL: Digoxin Level: <0.6 ng/mL — ABNORMAL LOW (ref 0.8–2.0)

## 2024-08-22 MED ORDER — METOPROLOL SUCCINATE ER 100 MG PO TB24: 200.0000 mg | ORAL_TABLET | Freq: Two times a day (BID) | ORAL | Status: DC

## 2024-08-22 MED ORDER — MORPHINE SULFATE (PF) 2 MG/ML IV SOLN
1.0000 mg | Freq: Once | INTRAVENOUS | Status: DC | PRN
  Filled 2024-08-22: qty 1

## 2024-08-22 MED ORDER — FENTANYL CITRATE (PF) 50 MCG/ML IJ SOSY
12.5000 ug | PREFILLED_SYRINGE | Freq: Once | INTRAMUSCULAR | Status: AC | PRN
  Administered 2024-08-22: 12.5 ug via INTRAVENOUS
  Filled 2024-08-22: qty 1

## 2024-08-22 MED ORDER — METOPROLOL SUCCINATE ER 50 MG PO TB24
150.0000 mg | ORAL_TABLET | Freq: Two times a day (BID) | ORAL | Status: DC
  Administered 2024-08-22 – 2024-08-27 (×10): 150 mg via ORAL
  Filled 2024-08-22 (×11): qty 1

## 2024-08-22 MED ORDER — FUROSEMIDE 10 MG/ML IJ SOLN
40.0000 mg | Freq: Once | INTRAMUSCULAR | Status: AC
Start: 2024-08-22 — End: 2024-08-22
  Administered 2024-08-22: 40 mg via INTRAVENOUS
  Filled 2024-08-22: qty 4

## 2024-08-22 NOTE — Plan of Care (Signed)
   Problem: Education: Goal: Knowledge of General Education information will improve Description: Including pain rating scale, medication(s)/side effects and non-pharmacologic comfort measures Outcome: Progressing   Problem: Clinical Measurements: Goal: Ability to maintain clinical measurements within normal limits will improve Outcome: Progressing Goal: Will remain free from infection Outcome: Progressing   Problem: Activity: Goal: Risk for activity intolerance will decrease Outcome: Progressing   Problem: Nutrition: Goal: Adequate nutrition will be maintained Outcome: Progressing   Problem: Pain Managment: Goal: General experience of comfort will improve and/or be controlled Outcome: Progressing   Problem: Safety: Goal: Ability to remain free from injury will improve Outcome: Progressing

## 2024-08-22 NOTE — Progress Notes (Signed)
 [facility] PROGRESS NOTE    Progress Note  TATUM MASSMAN  FMW:[id number] DOB: [birth year] DOA: 08/16/2024 PCP: Delores Rojelio Caldron, NP     Brief Narrative:   [name] is an 68 y.o. male past medical history of HFrEF chronic atrial fibrillation not on anticoagulation, alcoholic liver cirrhosis polysubstance abuse brought in by EMS for shortness of breath and chest pain was found to be in A-fib with RVR started on IV Cardizem  IV heparin  drip. CT scan of the abdomen pelvis showed duodenitis.  2D echo showed an EF of 40% cardiology was consulted.  The patient has also been complaining of epigastric pain he consumes Motrin  about 6 times a day he has done that for years.   Assessment/Plan:   Atrial fibrillation with rapid ventricular response (HCC) Likely due to noncompliance with his medication. Now on Toprol -XL and digoxin and rate control UDS negative, TSH unremarkable. Alcohol  abuse seems to be ongoing. Cardiology to continue to titrate medications. Will need to be off anticoagulation upon discharge due to his recurrence noncompliance and alcohol  use.  Chronic HFrEF: Chest x-ray showed no acute findings. 2D echo was done that showed an EF of 40% Continue Toprol .  Duodenitis seen on CT, Continue on PPI, no alarming symptoms hemoglobin is stable. Can symptoms Motrin  about 6 times a day he has done that for years alcohol  regularly. Eliquis  was held GI was consulted. Scheduled for EGD on 08/23/2024.  Alcohol  use: Continue gabapentin . Monitor with CIWA protocol.  Alcoholic liver cirrhosis: Counseling.  Essential hypertension: Continue metoprolol .  History of substance abuse: UDS unremarkable.  Thrombocytopenia: Return to alcohol  abuse.   DVT prophylaxis: SCDs Family Communication:none Status is: Inpatient Remains inpatient appropriate because: Persistent atrial fibrillation    Code Status:     Code Status Orders  (From admission, onward)            Start     Ordered   08/16/24 2358  Full code  Continuous       Question:  By:  Answer:  Consent: discussion documented in EHR   08/16/24 2358           Code Status History     Date Active Date Inactive Code Status Order ID Comments User Context   08/05/2024 1759 08/06/2024 2225 Full Code [id number]  Jonel Lonni SQUIBB, MD ED   05/03/2024 1622 05/31/2024 2101 Full Code [id number]  Wilkie Majel RAMAN, FNP Inpatient   05/01/2024 0206 05/03/2024 1620 Full Code [id number]  Dasie Leonor CROME, MD Inpatient   10/18/2023 2315 10/22/2023 1543 Full Code [id number]  Silvester Ales, MD ED   10/05/2023 1404 10/16/2023 1809 Full Code [id number]  Zella Katha HERO, MD ED   07/02/2023 0114 07/05/2023 2024 Full Code [id number]  Lee Kingfisher, MD ED   07/01/2023 1055 07/01/2023 1835 Full Code [id number]  Randall Starlyn HERO, NP ED   07/01/2023 1036 07/01/2023 1055 Full Code [id number]  Randall Starlyn HERO, NP ED   06/30/2023 2225 07/01/2023 1013 Full Code [id number]  Green Roxianne BRAVO, NP ED   05/07/2023 1021 05/12/2023 2116 Full Code [id number]  Ezzard Staci SAILOR, NP ED   05/07/2023 0051 05/07/2023 1017 Full Code [id number]  Mardi Kathryne LABOR, NP ED   05/06/2023 1043 05/06/2023 1536 Full Code [id number]  Randall Starlyn HERO, NP ED   02/24/2023 0016 04/03/2023 2230 Full Code [id number]  Kinsinger, Herlene Righter, MD Inpatient   01/24/2023 1524 01/24/2023 2107 Full Code [id number]  Darra Fonda MATSU, MD ED   01/10/2023 0022 01/12/2023 1427  Full Code [id number]  Trudy Carwin, NP ED   01/09/2023 1707 01/09/2023 2358 Full Code [id number]  Adolph Tinnie BRAVO, PA-C ED   07/06/2022 1626 07/08/2022 1828 Full Code [id number]  Elner Luisa NOVAK, NP ED   06/10/2022 1243 06/15/2022 1517 Full Code [id number]  Teresa Wyline CROME, NP ED   05/30/2022 1019 06/01/2022 2223 Full Code [id number]  Marry Clamp, MD ED   05/29/2022 1954 05/30/2022 1019 Full Code [id number]  Mardi Kathryne LABOR, NP ED   05/24/2022 2100 05/25/2022 1833 Full Code [id number]  Alan Thurman GAILS, NP ED    05/10/2022 1414 05/11/2022 1703 Full Code [id number]  Melvenia Motto, MD ED   05/06/2022 0946 05/06/2022 1300 Full Code [id number]  Levander Houston, MD ED   04/20/2022 1903 05/03/2022 2313 Full Code [id number]  Stechschulte, Deward PARAS, MD ED   03/31/2022 2256 04/02/2022 2014 Full Code [id number]  Long, Fonda MATSU, MD ED   01/22/2022 1349 01/22/2022 1926 Full Code [id number]  Eudelia Maude SAUNDERS, PA-C ED   01/05/2022 1358 01/06/2022 1603 Full Code [id number]  Mertha Lum LABOR, PA-C ED   02/07/2021 2310 02/11/2021 2019 Full Code [id number]  Franchester Snare, MD ED   01/20/2021 2051 01/26/2021 1608 Full Code [id number]  Doretha Folks, MD ED   12/01/2020 1905 01/18/2021 2312 Full Code [id number]  Stechschulte, Deward PARAS, MD ED   11/24/2020 2254 11/26/2020 1331 Full Code [id number]  Beverley Leita LABOR, PA-C ED   03/17/2012 1740 03/20/2012 1121 Full Code [id number]  Margrette Pear, MD ED         IV Access:   Peripheral IV   Procedures and diagnostic studies:   CT HEAD WO CONTRAST ( ) Result Date: 08/22/2024 EXAM: CT HEAD WITHOUT CONTRAST 08/22/2024 03:05:26 AM TECHNIQUE: CT of the head was performed without the administration of intravenous contrast. Automated exposure control, iterative reconstruction, and/or weight based adjustment of the mA/kV was utilized to reduce the radiation dose to as low as reasonably achievable. COMPARISON: CT head 10/07/2023 CLINICAL HISTORY: Headache, sudden, severe. FINDINGS: BRAIN AND VENTRICLES: No acute hemorrhage. No evidence of acute infarct. No hydrocephalus. No extra-axial collection. No mass effect or midline shift. ORBITS: No acute abnormality. SINUSES: No acute abnormality. SOFT TISSUES AND SKULL: No acute soft tissue abnormality. No skull fracture. IMPRESSION: 1. No acute intracranial abnormality. Electronically signed by: Gilmore Molt MD 08/22/2024 03:20 AM EST RP Workstation: [id number]     Medical Consultants:   None.   Subjective:    [name] abdominal pain epigastric  is improved  Objective:    Vitals:   08/21/24 2029 08/22/24 0006 08/22/24 0308 08/22/24 0700  BP: 130/73 117/76 112/76 106/79  Pulse: 100  96 63  Resp:  17 16 17   Temp:  97.6 F (36.4 C) 97.9 F (36.6 C) 98.6 F (37 C)  TempSrc:  Oral Oral Oral  SpO2:  100% 99% 100%  Weight:   102.7 kg   Height:       SpO2: 100 %   Intake/Output Summary (Last 24 hours) at 08/22/2024 0916 Last data filed at 08/21/2024 2021 Gross per 24 hour  Intake 480 ml  Output 2550 ml  Net -2070 ml   Filed Weights   08/20/24 0524 08/21/24 0431 08/22/24 0308  Weight: 100.9 kg 101.8 kg 102.7 kg    Exam: General exam: In no acute distress. Respiratory system: Good air movement and clear to auscultation. Cardiovascular system: S1 & S2 heard, RRR. No JVD. Gastrointestinal system: Abdomen is nondistended, soft and nontender.  Extremities: No pedal  edema. Skin: No rashes, lesions or ulcers Psychiatry: Judgement and insight appear normal. Mood & affect appropriate.  Data Reviewed:    Labs: Basic Metabolic Panel: Recent Labs  Lab 08/18/24 0510 08/18/24 0934 08/19/24 0956 08/20/24 0417 08/21/24 0459 08/22/24 0651  NA  --  137 139 138 135 138  K  --  4.2 4.3 3.8 4.5 4.7  CL  --  107 105 104 101 105  CO2  --  21* 21* 22 23 19*  GLUCOSE  --  166* 136* 145* 116* 95  BUN  --  9 9 14 19 19   CREATININE  --  0.97 0.95 1.08 0.94 0.91  CALCIUM   --  8.7* 8.9 9.1 9.1 8.6*  MG 1.7  --  1.7 1.7 1.8 1.9  PHOS  --  2.6 2.6 3.5 4.4  --    GFR Estimated Creatinine Clearance: 90.5 mL/min (by C-G formula based on SCr of 0.91 mg/dL). Liver Function Tests: Recent Labs  Lab 08/17/24 1224 08/18/24 0934 08/19/24 0956 08/20/24 0417 08/21/24 0459 08/22/24 0651  AST 32  --   --   --   --  30  ALT 23  --   --   --   --  25  ALKPHOS 72  --   --   --   --  63  BILITOT 1.7*  --   --   --   --  0.8  PROT 6.2*  --   --   --   --  6.8  ALBUMIN  2.8* 2.6* 2.8* 2.6* 2.9* 2.8*   Recent Labs  Lab 08/16/24 2002   LIPASE 32   No results for input(s): AMMONIA in the last 168 hours. Coagulation profile No results for input(s): INR, PROTIME in the last 168 hours. COVID-19 Labs  No results for input(s): DDIMER, FERRITIN, LDH, CRP in the last 72 hours.  Lab Results  Component Value Date   SARSCOV2NAA NEGATIVE 05/02/2024   SARSCOV2NAA NEGATIVE 10/05/2023   SARSCOV2NAA NEGATIVE 07/01/2023   SARSCOV2NAA NEGATIVE 01/09/2023    CBC: Recent Labs  Lab 08/18/24 0510 08/19/24 0458 08/20/24 0417 08/21/24 0459 08/22/24 0651  WBC 5.5 5.9 5.0 6.4 6.2  HGB 14.0 14.6 14.6 15.4 15.7  HCT 44.5 45.5 45.7 46.9 48.1  MCV 90.1 86.5 87.5 85.6 85.3  PLT 112* 112* 125* 151 141*   Cardiac Enzymes: No results for input(s): CKTOTAL, CKMB, CKMBINDEX, TROPONINI in the last 168 hours. BNP (last 3 results) No results for input(s): PROBNP in the last 8760 hours. CBG: No results for input(s): GLUCAP in the last 168 hours. D-Dimer: No results for input(s): DDIMER in the last 72 hours. Hgb A1c: No results for input(s): HGBA1C in the last 72 hours.  Lipid Profile: No results for input(s): CHOL, HDL, LDLCALC, TRIG, CHOLHDL, LDLDIRECT in the last 72 hours. Thyroid  function studies: No results for input(s): TSH, T4TOTAL, T3FREE, THYROIDAB in the last 72 hours.  Invalid input(s): FREET3 Anemia work up: No results for input(s): VITAMINB12, FOLATE, FERRITIN, TIBC, IRON, RETICCTPCT in the last 72 hours. Sepsis Labs: Recent Labs  Lab 08/19/24 0458 08/20/24 0417 08/21/24 0459 08/22/24 0651  WBC 5.9 5.0 6.4 6.2   Microbiology Recent Results (from the past 240 hours)  MRSA Next Gen by PCR, Nasal     Status: Abnormal   Collection Time: 08/17/24  3:48 AM   Specimen: Nasal Mucosa; Nasal Swab  Result Value Ref Range Status   MRSA by PCR Next Gen DETECTED (A) NOT DETECTED Final  Comment: RESULT CALLED TO, READ BACK BY AND VERIFIED WITH: A BUENDIA RN  08/17/2024 @ 9376 BY AB (NOTE) The GeneXpert MRSA Assay (FDA approved for NASAL specimens only), is one component of a comprehensive MRSA colonization surveillance program. It is not intended to diagnose MRSA infection nor to guide or monitor treatment for MRSA infections. Test performance is not FDA approved in patients less than 7 years old. Performed at [facility], [address] [place], [place] [address]      Medications:    baclofen   10 mg Oral TID   Chlorhexidine  Gluconate Cloth  6 each Topical Daily   digoxin  0.125 mg Oral Daily   folic acid   1 mg Oral Daily   gabapentin   300 mg Oral TID   metoprolol  succinate  100 mg Oral Q12H   multivitamin with minerals  1 tablet Oral Daily   mupirocin  ointment  1 Application Nasal BID   pantoprazole  (PROTONIX ) IV  40 mg Intravenous Q12H   thiamine   100 mg Oral Daily   Or   thiamine   100 mg Intravenous Daily   Continuous Infusions:      LOS: 5 days   Erle Odell Castor  [facility]  08/22/2024, 9:16 AM

## 2024-08-22 NOTE — Progress Notes (Addendum)
 Progress Note  Patient Name: [name] Date of Encounter: 08/22/2024 [facility] Cardiologist: [name] LITTIE Nanas, MD   Interval Summary   Reports on going chest discomfort, palpitations and DOE. Has one episode overnight of chest discomfort and shortness of breath that woke him from his sleep.  Reported he wished he would have a heart attack because he does not want to live anymore and was tearful. [ Looped communication with primary team].  He is interested in rehab/nursing facility after admission as he is fearful he will return to drinking after discharge.  Vital Signs Vitals:   08/21/24 2029 08/22/24 0006 08/22/24 0308 08/22/24 0700  BP: 130/73 117/76 112/76 106/79  Pulse: 100  96 63  Resp:  17 16 17   Temp:  97.6 F (36.4 C) 97.9 F (36.6 C) 98.6 F (37 C)  TempSrc:  Oral Oral Oral  SpO2:  100% 99% 100%  Weight:   102.7 kg   Height:        Intake/Output Summary (Last 24 hours) at 08/22/2024 0821 Last data filed at 08/21/2024 2021 Gross per 24 hour  Intake 480 ml  Output 2550 ml  Net -2070 ml      08/22/2024    3:08 AM 08/21/2024    4:31 AM 08/20/2024    5:24 AM  Last 3 Weights  Weight (lbs) 226 lb 6.4 oz 224 lb 6.9 oz 222 lb 6.4 oz  Weight (kg) 102.694 kg 101.8 kg 100.88 kg      Telemetry/ECG  AF rate ~ 95 - Personally Reviewed  Physical Exam  GEN: No acute distress.   Neck: mild JVD Cardiac: Irregularly, irregular, no murmurs, rubs, or gallops.  Respiratory: Clear to auscultation bilaterally. GI: Soft, nontender, non-distended  MS: No edema  Assessment & Plan  [name] is a 68 y.o. male with a hx of PAF not on AC, Hypertension, substance abuse, alcohol  abuse with liver cirrhosis, and depression who presented to the ED on 10/31 for SOB and chest pain. He was noted to be in AF RVR HR ~130s. Troponin negative. He was started on IV heparin  and IV dilt which was transitioned to oral metoprolol . He has remained in AF RVR, and  cardiology was consulted for further management.   Persistent atrial fibrillation with episode of RVR Will avoid rhythm control as patient in unable to be anticoagulated 2/2 heavy NSAID/ETOH use and ongoing duodenitis.  Currently in mostly rate controlled AF Chad Vasc score 6  Digoxin is not a long term solution given concern over medication and follow up compliance.  Will order digoxin level today.    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 No formal ischemic evaluation has been preformed, patient has not appeared to follow up appointments. No urgent indication this admission to pursue.  On exam appears, mildly volume up. Albumin  2.8.  Given his episode of what sounds like PND and mild JVD will give one dose of lasix  today.  Give IV lasix  40 mg   Continue metoprolol  as above Will hold on starting MRA/ARB given BP.   Hypertension BP: 106/79 Medications as above   Per primary Substance use  Cirrhosis Hypoalbuminemia Abdominal discomfort/Duodenitis Depression Headache   For questions or updates, please contact [facility] Please consult [email or web address] for contact info under       Signed, [name] LOISE Salen, PA-C   Patient  seen and examined, note reviewed with the signed Advanced Practice Provider. I personally reviewed laboratory data, imaging studies and relevant notes. I independently examined the patient and formulated the important aspects of the plan. I have personally discussed the plan with the patient and/or family. Comments or changes to the note/plan are indicated below.  Persistent atrial fibrillation-now on Toprol -XL and digoxin heart rate has improved significantly.  Chads Vascor is 6 but there has been resolution prior to this hospitalization to keep the patient off anticoagulation given the fact that significant alcohol  use  and risk of bleeding.  He has been part of these conversations in the past as well.  Agree with digoxin level today.   In terms of his nonischemic cardiomyopathy, his blood pressure has been minimal and leading to not been able to optimize his guideline directed medical therapy.  Agree with 1 dose of Lasix  today.  Will continue to follow with you.  [name] Croghan DO, MS [facility] Attending Cardiologist [facility]  [address] [place], [place] [address] [phone] Website: [email or web address]

## 2024-08-22 NOTE — Progress Notes (Signed)
 Overnight cross coverage  Stat CT head ordered due to patient complaining of severe 10 out of 10 intensity headache unrelieved with Tylenol  and oxycodone .  Informed by RN that patient is reporting history of chronic headaches but much worse now and requesting brain imaging.
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
## 2024-08-23 DIAGNOSIS — K7030 Alcoholic cirrhosis of liver without ascites: Secondary | ICD-10-CM | POA: Diagnosis not present

## 2024-08-23 DIAGNOSIS — R935 Abnormal findings on diagnostic imaging of other abdominal regions, including retroperitoneum: Secondary | ICD-10-CM | POA: Diagnosis not present

## 2024-08-23 DIAGNOSIS — K2980 Duodenitis without bleeding: Secondary | ICD-10-CM | POA: Diagnosis not present

## 2024-08-23 MED ORDER — HYDROCODONE-ACETAMINOPHEN 7.5-325 MG PO TABS
1.0000 | ORAL_TABLET | Freq: Four times a day (QID) | ORAL | Status: DC | PRN
  Administered 2024-08-23: 1 via ORAL
  Filled 2024-08-23: qty 1

## 2024-08-23 MED ORDER — OXYCODONE HCL 5 MG PO TABS
10.0000 mg | ORAL_TABLET | ORAL | Status: DC | PRN
  Administered 2024-08-23 (×2): 10 mg via ORAL
  Filled 2024-08-23 (×2): qty 2

## 2024-08-23 NOTE — Progress Notes (Signed)
 [facility] PROGRESS NOTE    Progress Note  WILLMAR STOCKINGER  FMW:[id number] DOB: 16-Jun-1956 DOA: 08/16/2024 PCP: Delores Rojelio Caldron, NP     Brief Narrative:   [name] is an 68 y.o. male past medical history of HFrEF chronic atrial fibrillation not on anticoagulation, alcoholic liver cirrhosis polysubstance abuse brought in by EMS for shortness of breath and chest pain was found to be in A-fib with RVR started on IV Cardizem  IV heparin  drip. CT scan of the abdomen pelvis showed duodenitis.  2D echo showed an EF of 40% cardiology was consulted.  The patient has also been complaining of epigastric pain he consumes Motrin  about 6 times a day he has done that for years. Assessment/Plan:   Atrial fibrillation with rapid ventricular response (HCC) Likely due to noncompliance with his medication. Now on Toprol -XL and digoxin and rate control UDS negative, TSH unremarkable. Alcohol  abuse seems to be ongoing. Cardiology to continue to titrate medications. Will need to be off anticoagulation upon discharge due to his recurrence medication noncompliance and alcohol  use.  Chronic HFrEF: Chest x-ray showed no acute findings. 2D echo was done that showed an EF of 40% Continue Toprol .  Duodenitis seen on CT, Continue on PPI, no alarming symptoms hemoglobin is stable. Consuming Motrin  about 6 times a day he has done that for years alcohol  regularly. Eliquis  was held GI was consulted, scheduled for EGD on 08/23/2024.  Alcohol  use: Continue gabapentin . Monitor with CIWA protocol.  Alcoholic liver cirrhosis: Counseling.  Essential hypertension: Continue metoprolol .  History of substance abuse: UDS unremarkable.  Thrombocytopenia: Return to alcohol  abuse.   DVT prophylaxis: SCDs Family Communication:none Status is: Inpatient Remains inpatient appropriate because: Persistent atrial fibrillation    Code Status:     Code Status Orders  (From admission, onward)            Start     Ordered   08/16/24 2358  Full code  Continuous       Question:  By:  Answer:  Consent: discussion documented in EHR   08/16/24 2358           Code Status History     Date Active Date Inactive Code Status Order ID Comments User Context   08/05/2024 1759 08/06/2024 2225 Full Code [id number]  Jonel Lonni SQUIBB, MD ED   05/03/2024 1622 05/31/2024 2101 Full Code [id number]  Wilkie Majel RAMAN, FNP Inpatient   05/01/2024 0206 05/03/2024 1620 Full Code [id number]  Dasie Leonor CROME, MD Inpatient   10/18/2023 2315 10/22/2023 1543 Full Code [id number]  Silvester Ales, MD ED   10/05/2023 1404 10/16/2023 1809 Full Code [id number]  Zella Katha HERO, MD ED   07/02/2023 0114 07/05/2023 2024 Full Code [id number]  Lee Kingfisher, MD ED   07/01/2023 1055 07/01/2023 1835 Full Code [id number]  Randall Starlyn HERO, NP ED   07/01/2023 1036 07/01/2023 1055 Full Code [id number]  Randall Starlyn HERO, NP ED   06/30/2023 2225 07/01/2023 1013 Full Code [id number]  Green Roxianne BRAVO, NP ED   05/07/2023 1021 05/12/2023 2116 Full Code [id number]  Ezzard Staci SAILOR, NP ED   05/07/2023 0051 05/07/2023 1017 Full Code [id number]  Mardi Kathryne LABOR, NP ED   05/06/2023 1043 05/06/2023 1536 Full Code [id number]  Randall Starlyn HERO, NP ED   02/24/2023 0016 04/03/2023 2230 Full Code [id number]  Kinsinger, Herlene Righter, MD Inpatient   01/24/2023 1524 01/24/2023 2107 Full Code [id number]  Darra Fonda MATSU, MD ED   01/10/2023 0022 01/12/2023 1427 Full Code  [id number]  Trudy Carwin, NP ED   01/09/2023 1707 01/09/2023 2358 Full Code [id number]  Adolph Tinnie BRAVO, PA-C ED   07/06/2022 1626 07/08/2022 1828 Full Code [id number]  Elner Luisa NOVAK, NP ED   06/10/2022 1243 06/15/2022 1517 Full Code [id number]  Teresa Wyline CROME, NP ED   05/30/2022 1019 06/01/2022 2223 Full Code [id number]  Marry Clamp, MD ED   05/29/2022 1954 05/30/2022 1019 Full Code [id number]  Mardi Kathryne LABOR, NP ED   05/24/2022 2100 05/25/2022 1833 Full Code [id number]  Alan Thurman GAILS,  NP ED   05/10/2022 1414 05/11/2022 1703 Full Code [id number]  Melvenia Motto, MD ED   05/06/2022 0946 05/06/2022 1300 Full Code [id number]  Levander Houston, MD ED   04/20/2022 1903 05/03/2022 2313 Full Code [id number]  Stechschulte, Deward PARAS, MD ED   03/31/2022 2256 04/02/2022 2014 Full Code [id number]  Long, Fonda MATSU, MD ED   01/22/2022 1349 01/22/2022 1926 Full Code [id number]  Eudelia Maude SAUNDERS, PA-C ED   01/05/2022 1358 01/06/2022 1603 Full Code [id number]  Redwine, Lum LABOR, PA-C ED   02/07/2021 2310 02/11/2021 2019 Full Code [id number]  Franchester Snare, MD ED   01/20/2021 2051 01/26/2021 1608 Full Code [id number]  Doretha Folks, MD ED   12/01/2020 1905 01/18/2021 2312 Full Code [id number]  Stechschulte, Deward PARAS, MD ED   11/24/2020 2254 11/26/2020 1331 Full Code [id number]  Beverley Leita LABOR, PA-C ED   03/17/2012 1740 03/20/2012 1121 Full Code [id number]  Margrette Pear, MD ED         IV Access:   Peripheral IV   Procedures and diagnostic studies:   CT HEAD WO CONTRAST ( ) Result Date: 08/22/2024 EXAM: CT HEAD WITHOUT CONTRAST 08/22/2024 03:05:26 AM TECHNIQUE: CT of the head was performed without the administration of intravenous contrast. Automated exposure control, iterative reconstruction, and/or weight based adjustment of the mA/kV was utilized to reduce the radiation dose to as low as reasonably achievable. COMPARISON: CT head 10/07/2023 CLINICAL HISTORY: Headache, sudden, severe. FINDINGS: BRAIN AND VENTRICLES: No acute hemorrhage. No evidence of acute infarct. No hydrocephalus. No extra-axial collection. No mass effect or midline shift. ORBITS: No acute abnormality. SINUSES: No acute abnormality. SOFT TISSUES AND SKULL: No acute soft tissue abnormality. No skull fracture. IMPRESSION: 1. No acute intracranial abnormality. Electronically signed by: Gilmore Molt MD 08/22/2024 03:20 AM EST RP Workstation: [id number]     Medical Consultants:   None.   Subjective:    Iseah Plouff Pittman  epigastric pain is  improved.  Objective:    Vitals:   08/22/24 2054 08/22/24 2357 08/23/24 0555 08/23/24 0819  BP: 105/65 109/63 119/82 124/86  Pulse: 89 (!) 102 82 81  Resp:  20 20 17   Temp:  97.8 F (36.6 C) 97.9 F (36.6 C) (!) 97.4 F (36.3 C)  TempSrc:  Oral Oral Oral  SpO2:  98% 99%   Weight:   101.2 kg   Height:       SpO2: 99 %   Intake/Output Summary (Last 24 hours) at 08/23/2024 0835 Last data filed at 08/23/2024 0800 Gross per 24 hour  Intake 480 ml  Output --  Net 480 ml   Filed Weights   08/21/24 0431 08/22/24 0308 08/23/24 0555  Weight: 101.8 kg 102.7 kg 101.2 kg    Exam: General exam: In no acute distress. Respiratory system: Good air movement and clear to auscultation. Cardiovascular system: S1 & S2 heard, RRR. No JVD. Gastrointestinal system: Abdomen is nondistended, soft and nontender.  Extremities: No pedal edema.  Skin: No rashes, lesions or ulcers Psychiatry: Judgement and insight appear normal. Mood & affect appropriate.  Data Reviewed:    Labs: Basic Metabolic Panel: Recent Labs  Lab 08/18/24 0510 08/18/24 0934 08/19/24 0956 08/20/24 0417 08/21/24 0459 08/22/24 0651  NA  --  137 139 138 135 138  K  --  4.2 4.3 3.8 4.5 4.7  CL  --  107 105 104 101 105  CO2  --  21* 21* 22 23 19*  GLUCOSE  --  166* 136* 145* 116* 95  BUN  --  9 9 14 19 19   CREATININE  --  0.97 0.95 1.08 0.94 0.91  CALCIUM   --  8.7* 8.9 9.1 9.1 8.6*  MG 1.7  --  1.7 1.7 1.8 1.9  PHOS  --  2.6 2.6 3.5 4.4  --    GFR Estimated Creatinine Clearance: 89.8 mL/min (by C-G formula based on SCr of 0.91 mg/dL). Liver Function Tests: Recent Labs  Lab 08/17/24 1224 08/18/24 0934 08/19/24 0956 08/20/24 0417 08/21/24 0459 08/22/24 0651  AST 32  --   --   --   --  30  ALT 23  --   --   --   --  25  ALKPHOS 72  --   --   --   --  63  BILITOT 1.7*  --   --   --   --  0.8  PROT 6.2*  --   --   --   --  6.8  ALBUMIN  2.8* 2.6* 2.8* 2.6* 2.9* 2.8*   Recent Labs  Lab 08/16/24 2002   LIPASE 32   No results for input(s): AMMONIA in the last 168 hours. Coagulation profile No results for input(s): INR, PROTIME in the last 168 hours. COVID-19 Labs  No results for input(s): DDIMER, FERRITIN, LDH, CRP in the last 72 hours.  Lab Results  Component Value Date   SARSCOV2NAA NEGATIVE 05/02/2024   SARSCOV2NAA NEGATIVE 10/05/2023   SARSCOV2NAA NEGATIVE 07/01/2023   SARSCOV2NAA NEGATIVE 01/09/2023    CBC: Recent Labs  Lab 08/18/24 0510 08/19/24 0458 08/20/24 0417 08/21/24 0459 08/22/24 0651  WBC 5.5 5.9 5.0 6.4 6.2  HGB 14.0 14.6 14.6 15.4 15.7  HCT 44.5 45.5 45.7 46.9 48.1  MCV 90.1 86.5 87.5 85.6 85.3  PLT 112* 112* 125* 151 141*   Cardiac Enzymes: No results for input(s): CKTOTAL, CKMB, CKMBINDEX, TROPONINI in the last 168 hours. BNP (last 3 results) No results for input(s): PROBNP in the last 8760 hours. CBG: No results for input(s): GLUCAP in the last 168 hours. D-Dimer: No results for input(s): DDIMER in the last 72 hours. Hgb A1c: No results for input(s): HGBA1C in the last 72 hours.  Lipid Profile: No results for input(s): CHOL, HDL, LDLCALC, TRIG, CHOLHDL, LDLDIRECT in the last 72 hours. Thyroid  function studies: No results for input(s): TSH, T4TOTAL, T3FREE, THYROIDAB in the last 72 hours.  Invalid input(s): FREET3 Anemia work up: No results for input(s): VITAMINB12, FOLATE, FERRITIN, TIBC, IRON, RETICCTPCT in the last 72 hours. Sepsis Labs: Recent Labs  Lab 08/19/24 0458 08/20/24 0417 08/21/24 0459 08/22/24 0651  WBC 5.9 5.0 6.4 6.2   Microbiology Recent Results (from the past 240 hours)  MRSA Next Gen by PCR, Nasal     Status: Abnormal   Collection Time: 08/17/24  3:48 AM   Specimen: Nasal Mucosa; Nasal Swab  Result Value Ref Range Status   MRSA by PCR Next Gen DETECTED (A) NOT DETECTED Final  Comment: RESULT CALLED TO, READ BACK BY AND VERIFIED WITH: A BUENDIA RN  08/17/2024 @ 9376 BY AB (NOTE) The GeneXpert MRSA Assay (FDA approved for NASAL specimens only), is one component of a comprehensive MRSA colonization surveillance program. It is not intended to diagnose MRSA infection nor to guide or monitor treatment for MRSA infections. Test performance is not FDA approved in patients less than 89 years old. Performed at [facility], [address] [place], [place] [address]      Medications:    baclofen   10 mg Oral TID   digoxin  0.125 mg Oral Daily   folic acid   1 mg Oral Daily   gabapentin   300 mg Oral TID   metoprolol  succinate  150 mg Oral Q12H   multivitamin with minerals  1 tablet Oral Daily   pantoprazole  (PROTONIX ) IV  40 mg Intravenous Q12H   thiamine   100 mg Oral Daily   Or   thiamine   100 mg Intravenous Daily   Continuous Infusions:      LOS: 6 days   Erle Odell Castor  [facility]  08/23/2024, 8:35 AM

## 2024-08-23 NOTE — Progress Notes (Signed)
 Chart review, no new recommendations. We will follow remotely. Please call if clinical picture changes and does not need for cardiology.

## 2024-08-24 ENCOUNTER — Encounter (HOSPITAL_COMMUNITY): Admission: EM | Disposition: A | Payer: Self-pay | Source: Home / Self Care | Attending: Internal Medicine

## 2024-08-24 ENCOUNTER — Encounter (HOSPITAL_COMMUNITY): Payer: Self-pay | Admitting: Internal Medicine

## 2024-08-24 ENCOUNTER — Inpatient Hospital Stay (HOSPITAL_COMMUNITY): Admitting: Anesthesiology

## 2024-08-24 DIAGNOSIS — K7030 Alcoholic cirrhosis of liver without ascites: Secondary | ICD-10-CM | POA: Diagnosis not present

## 2024-08-24 DIAGNOSIS — K2980 Duodenitis without bleeding: Secondary | ICD-10-CM

## 2024-08-24 DIAGNOSIS — I110 Hypertensive heart disease with heart failure: Secondary | ICD-10-CM

## 2024-08-24 DIAGNOSIS — K2970 Gastritis, unspecified, without bleeding: Secondary | ICD-10-CM

## 2024-08-24 DIAGNOSIS — J449 Chronic obstructive pulmonary disease, unspecified: Secondary | ICD-10-CM | POA: Diagnosis not present

## 2024-08-24 DIAGNOSIS — R935 Abnormal findings on diagnostic imaging of other abdominal regions, including retroperitoneum: Secondary | ICD-10-CM | POA: Diagnosis not present

## 2024-08-24 DIAGNOSIS — I5022 Chronic systolic (congestive) heart failure: Secondary | ICD-10-CM

## 2024-08-24 DIAGNOSIS — K269 Duodenal ulcer, unspecified as acute or chronic, without hemorrhage or perforation: Secondary | ICD-10-CM

## 2024-08-24 HISTORY — PX: ESOPHAGOGASTRODUODENOSCOPY: SHX5428

## 2024-08-24 HISTORY — PX: BONE BIOPSY: SHX375

## 2024-08-24 SURGERY — EGD (ESOPHAGOGASTRODUODENOSCOPY)
Anesthesia: Monitor Anesthesia Care

## 2024-08-24 MED ORDER — OXYCODONE HCL 5 MG PO TABS
15.0000 mg | ORAL_TABLET | ORAL | Status: DC | PRN
Start: 2024-08-24 — End: 2024-08-27
  Administered 2024-08-24 – 2024-08-27 (×12): 15 mg via ORAL
  Filled 2024-08-24 (×13): qty 3

## 2024-08-24 MED ORDER — PROPOFOL 500 MG/50ML IV EMUL
INTRAVENOUS | Status: DC | PRN
  Administered 2024-08-24: 150 ug/kg/min via INTRAVENOUS

## 2024-08-24 MED ORDER — MIDAZOLAM HCL 2 MG/2ML IJ SOLN
INTRAMUSCULAR | Status: AC
  Filled 2024-08-24: qty 2

## 2024-08-24 MED ORDER — ZOLPIDEM TARTRATE 5 MG PO TABS
5.0000 mg | ORAL_TABLET | Freq: Every evening | ORAL | Status: DC | PRN
Start: 2024-08-24 — End: 2024-08-27
  Filled 2024-08-24: qty 1

## 2024-08-24 MED ORDER — MIDAZOLAM HCL (PF) 2 MG/2ML IJ SOLN
INTRAMUSCULAR | Status: DC | PRN
  Administered 2024-08-24: 2 mg via INTRAVENOUS

## 2024-08-24 MED ORDER — KETAMINE HCL 50 MG/5ML IJ SOSY
PREFILLED_SYRINGE | INTRAMUSCULAR | Status: AC
  Filled 2024-08-24: qty 5

## 2024-08-24 MED ORDER — PANTOPRAZOLE SODIUM 40 MG PO TBEC
40.0000 mg | DELAYED_RELEASE_TABLET | Freq: Two times a day (BID) | ORAL | Status: DC
  Administered 2024-08-24 – 2024-08-27 (×7): 40 mg via ORAL
  Filled 2024-08-24 (×7): qty 1

## 2024-08-24 MED ORDER — ONDANSETRON HCL 4 MG/2ML IJ SOLN
INTRAMUSCULAR | Status: DC | PRN
  Administered 2024-08-24: 4 mg via INTRAVENOUS

## 2024-08-24 MED ORDER — DEXMEDETOMIDINE HCL IN NACL 80 MCG/20ML IV SOLN
INTRAVENOUS | Status: AC
  Filled 2024-08-24: qty 20

## 2024-08-24 MED ORDER — LIDOCAINE 2% (20 MG/ML) 5 ML SYRINGE
INTRAMUSCULAR | Status: DC | PRN
Start: 2024-08-24 — End: 2024-08-24
  Administered 2024-08-24: 80 mg via INTRAVENOUS

## 2024-08-24 MED ORDER — PROPOFOL 10 MG/ML IV BOLUS
INTRAVENOUS | Status: DC | PRN
  Administered 2024-08-24: 40 mg via INTRAVENOUS

## 2024-08-24 MED ORDER — SODIUM CHLORIDE 0.9 % IV SOLN
INTRAVENOUS | Status: DC | PRN
Start: 2024-08-24 — End: 2024-08-24

## 2024-08-24 NOTE — Progress Notes (Signed)
 [facility] PROGRESS NOTE    Progress Note  [name]  FMW:[id number] DOB: 1956-08-02 DOA: 08/16/2024 PCP: Delores Rojelio Caldron, NP     Brief Narrative:   [name] is an 68 y.o. male past medical history of HFrEF chronic atrial fibrillation not on anticoagulation, alcoholic liver cirrhosis polysubstance abuse brought in by EMS for shortness of breath and chest pain was found to be in A-fib with RVR started on IV Cardizem  IV heparin  drip. CT scan of the abdomen pelvis showed duodenitis.  2D echo showed an EF of 40% cardiology was consulted.  The patient has also been complaining of epigastric pain he consumes Motrin  about 6 times a day he has done that for years. Assessment/Plan:   Atrial fibrillation with rapid ventricular response (HCC) Likely due to noncompliance with his medication. Now on Toprol -XL and digoxin and rate control UDS negative, TSH unremarkable. Alcohol  abuse seems to be ongoing. Cardiology to continue to titrate medications. Will need to be off anticoagulation upon discharge due to his recurrence medication noncompliance and alcohol  use.  Chronic HFrEF: Chest x-ray showed no acute findings. 2D echo was done that showed an EF of 40% Continue Toprol .  Duodenitis seen on CT, Continue on PPI, no alarming symptoms hemoglobin is stable. EGD on 08/23/2024 gastritis with nonbleeding circumferential duodenal ulcer with mild luminal narrowing and large amount of food residue's. Biopsy for H. Pylori sent. GI rec soft diet advance slowly. Continue to avoid NSAIDs continue Protonix  twice a day.  Alcohol  use: Continue gabapentin . Monitor with CIWA protocol.  Alcoholic liver cirrhosis: Counseling.  Essential hypertension: Continue metoprolol .  History of substance abuse: UDS unremarkable.  Thrombocytopenia: Likely due to alcohol  abuse.   DVT prophylaxis: SCDs Family Communication:none Status is: Inpatient Remains inpatient appropriate because:  Persistent atrial fibrillation    Code Status:     Code Status Orders  (From admission, onward)           Start     Ordered   08/16/24 2358  Full code  Continuous       Question:  By:  Answer:  Consent: discussion documented in EHR   08/16/24 2358           Code Status History     Date Active Date Inactive Code Status Order ID Comments User Context   08/05/2024 1759 08/06/2024 2225 Full Code [id number]  Jonel Lonni SQUIBB, MD ED   05/03/2024 1622 05/31/2024 2101 Full Code [id number]  Wilkie Majel RAMAN, FNP Inpatient   05/01/2024 0206 05/03/2024 1620 Full Code [id number]  Dasie Leonor CROME, MD Inpatient   10/18/2023 2315 10/22/2023 1543 Full Code [id number]  Silvester Ales, MD ED   10/05/2023 1404 10/16/2023 1809 Full Code [id number]  Zella Katha HERO, MD ED   07/02/2023 0114 07/05/2023 2024 Full Code [id number]  Lee Kingfisher, MD ED   07/01/2023 1055 07/01/2023 1835 Full Code [id number]  Randall Starlyn HERO, NP ED   07/01/2023 1036 07/01/2023 1055 Full Code [id number]  Randall Starlyn HERO, NP ED   06/30/2023 2225 07/01/2023 1013 Full Code [id number]  Green Roxianne BRAVO, NP ED   05/07/2023 1021 05/12/2023 2116 Full Code [id number]  [name] Staci SAILOR, NP ED   05/07/2023 0051 05/07/2023 1017 Full Code [id number]  Mardi Kathryne LABOR, NP ED   05/06/2023 1043 05/06/2023 1536 Full Code [id number]  Randall Starlyn HERO, NP ED   02/24/2023 0016 04/03/2023 2230 Full Code [id number]  Kinsinger, Herlene Righter, MD Inpatient   01/24/2023 1524 01/24/2023 2107 Full Code [id number]  Darra Fonda MATSU, MD ED   01/10/2023 0022 01/12/2023 1427 Full Code [id number]  Trudy Carwin, NP ED   01/09/2023 1707 01/09/2023 2358 Full Code [id number]  Adolph Tinnie BRAVO, PA-C ED   07/06/2022 1626 07/08/2022 1828 Full Code [id number]  Elner Luisa NOVAK, NP ED   06/10/2022 1243 06/15/2022 1517 Full Code [id number]  Teresa Wyline CROME, NP ED   05/30/2022 1019 06/01/2022 2223 Full Code [id number]  Marry Clamp, MD ED   05/29/2022 1954 05/30/2022 1019 Full  Code [id number]  Mardi Kathryne LABOR, NP ED   05/24/2022 2100 05/25/2022 1833 Full Code [id number]  Alan Thurman GAILS, NP ED   05/10/2022 1414 05/11/2022 1703 Full Code [id number]  Melvenia Motto, MD ED   05/06/2022 0946 05/06/2022 1300 Full Code [id number]  Levander Houston, MD ED   04/20/2022 1903 05/03/2022 2313 Full Code [id number]  Stechschulte, Deward PARAS, MD ED   03/31/2022 2256 04/02/2022 2014 Full Code [id number]  Long, Fonda MATSU, MD ED   01/22/2022 1349 01/22/2022 1926 Full Code [id number]  Eudelia Maude SAUNDERS, PA-C ED   01/05/2022 1358 01/06/2022 1603 Full Code [id number]  Redwine, Lum LABOR, PA-C ED   02/07/2021 2310 02/11/2021 2019 Full Code [id number]  Franchester Snare, MD ED   01/20/2021 2051 01/26/2021 1608 Full Code [id number]  Doretha Folks, MD ED   12/01/2020 1905 01/18/2021 2312 Full Code [id number]  Stechschulte, Deward PARAS, MD ED   11/24/2020 2254 11/26/2020 1331 Full Code [id number]  Beverley Leita LABOR, PA-C ED   03/17/2012 1740 03/20/2012 1121 Full Code [id number]  Margrette Pear, MD ED         IV Access:   Peripheral IV   Procedures and diagnostic studies:   No results found.    Medical Consultants:   None.   Subjective:    [name]  cont to have epigastric pain.  Objective:    Vitals:   08/24/24 0728 08/24/24 0839 08/24/24 0845 08/24/24 0900  BP: 128/69 119/74 (!) 105/59 94/75  Pulse: 81 64 61 74  Resp: 15 19 13 18   Temp: 97.8 F (36.6 C) (!) 97.4 F (36.3 C)  (!) 97.4 F (36.3 C)  TempSrc: Temporal     SpO2: 99% 100% 96% 95%  Weight:      Height:       SpO2: 95 %   Intake/Output Summary (Last 24 hours) at 08/24/2024 0928 Last data filed at 08/24/2024 0829 Gross per 24 hour  Intake 1226 ml  Output --  Net 1226 ml   Filed Weights   08/21/24 0431 08/22/24 0308 08/23/24 0555  Weight: 101.8 kg 102.7 kg 101.2 kg    Exam: General exam: In no acute distress. Respiratory system: Good air movement and clear to auscultation. Cardiovascular system: S1 & S2 heard, RRR. No  JVD. Gastrointestinal system: Abdomen is nondistended, soft and nontender.  Extremities: No pedal edema. Skin: No rashes, lesions or ulcers Psychiatry: Judgement and insight appear normal. Mood & affect appropriate.  Data Reviewed:    Labs: Basic Metabolic Panel: Recent Labs  Lab 08/18/24 0510 08/18/24 0934 08/19/24 0956 08/20/24 0417 08/21/24 0459 08/22/24 0651  NA  --  137 139 138 135 138  K  --  4.2 4.3 3.8 4.5 4.7  CL  --  107 105 104 101 105  CO2  --  21* 21* 22 23 19*  GLUCOSE  --  166* 136* 145* 116* 95  BUN  --  9 9 14 19 19   CREATININE  --  0.97 0.95  1.08 0.94 0.91  CALCIUM   --  8.7* 8.9 9.1 9.1 8.6*  MG 1.7  --  1.7 1.7 1.8 1.9  PHOS  --  2.6 2.6 3.5 4.4  --    GFR Estimated Creatinine Clearance: 89.8 mL/min (by C-G formula based on SCr of 0.91 mg/dL). Liver Function Tests: Recent Labs  Lab 08/17/24 1224 08/18/24 0934 08/19/24 0956 08/20/24 0417 08/21/24 0459 08/22/24 0651  AST 32  --   --   --   --  30  ALT 23  --   --   --   --  25  ALKPHOS 72  --   --   --   --  63  BILITOT 1.7*  --   --   --   --  0.8  PROT 6.2*  --   --   --   --  6.8  ALBUMIN  2.8* 2.6* 2.8* 2.6* 2.9* 2.8*   No results for input(s): LIPASE, AMYLASE in the last 168 hours.  No results for input(s): AMMONIA in the last 168 hours. Coagulation profile No results for input(s): INR, PROTIME in the last 168 hours. COVID-19 Labs  No results for input(s): DDIMER, FERRITIN, LDH, CRP in the last 72 hours.  Lab Results  Component Value Date   SARSCOV2NAA NEGATIVE 05/02/2024   SARSCOV2NAA NEGATIVE 10/05/2023   SARSCOV2NAA NEGATIVE 07/01/2023   SARSCOV2NAA NEGATIVE 01/09/2023    CBC: Recent Labs  Lab 08/18/24 0510 08/19/24 0458 08/20/24 0417 08/21/24 0459 08/22/24 0651  WBC 5.5 5.9 5.0 6.4 6.2  HGB 14.0 14.6 14.6 15.4 15.7  HCT 44.5 45.5 45.7 46.9 48.1  MCV 90.1 86.5 87.5 85.6 85.3  PLT 112* 112* 125* 151 141*   Cardiac Enzymes: No results for input(s):  CKTOTAL, CKMB, CKMBINDEX, TROPONINI in the last 168 hours. BNP (last 3 results) No results for input(s): PROBNP in the last 8760 hours. CBG: No results for input(s): GLUCAP in the last 168 hours. D-Dimer: No results for input(s): DDIMER in the last 72 hours. Hgb A1c: No results for input(s): HGBA1C in the last 72 hours.  Lipid Profile: No results for input(s): CHOL, HDL, LDLCALC, TRIG, CHOLHDL, LDLDIRECT in the last 72 hours. Thyroid  function studies: No results for input(s): TSH, T4TOTAL, T3FREE, THYROIDAB in the last 72 hours.  Invalid input(s): FREET3 Anemia work up: No results for input(s): VITAMINB12, FOLATE, FERRITIN, TIBC, IRON, RETICCTPCT in the last 72 hours. Sepsis Labs: Recent Labs  Lab 08/19/24 0458 08/20/24 0417 08/21/24 0459 08/22/24 0651  WBC 5.9 5.0 6.4 6.2   Microbiology Recent Results (from the past 240 hours)  MRSA Next Gen by PCR, Nasal     Status: Abnormal   Collection Time: 08/17/24  3:48 AM   Specimen: Nasal Mucosa; Nasal Swab  Result Value Ref Range Status   MRSA by PCR Next Gen DETECTED (A) NOT DETECTED Final    Comment: RESULT CALLED TO, READ BACK BY AND VERIFIED WITH: A BUENDIA RN 08/17/2024 @ 9376 BY AB (NOTE) The GeneXpert MRSA Assay (FDA approved for NASAL specimens only), is one component of a comprehensive MRSA colonization surveillance program. It is not intended to diagnose MRSA infection nor to guide or monitor treatment for MRSA infections. Test performance is not FDA approved in patients less than 31 years old. Performed at [facility], [address], [place], [place] [address]      Medications:    [MAR Hold] baclofen   10 mg Oral TID   [MAR Hold] digoxin  0.125 mg  Oral Daily   [MAR Hold] folic acid   1 mg Oral Daily   [MAR Hold] gabapentin   300 mg Oral TID   [MAR Hold] metoprolol  succinate  150 mg Oral Q12H   [MAR Hold] multivitamin with minerals  1 tablet Oral Daily    pantoprazole   40 mg Oral BID AC   [MAR Hold] thiamine   100 mg Oral Daily   Or   [MAR Hold] thiamine   100 mg Intravenous Daily   Continuous Infusions:      LOS: 7 days   Erle Odell Castor  [facility]  08/24/2024, 9:28 AM

## 2024-08-24 NOTE — Plan of Care (Signed)
   Problem: Clinical Measurements: Goal: Diagnostic test results will improve Outcome: Progressing

## 2024-08-24 NOTE — Op Note (Signed)
 [facility] Patient Name: [name] Procedure Date : 08/24/2024 MRN: [id number] Attending MD: Gordy CHRISTELLA Starch , MD, [id number] Date of Birth: [birth year] CSN: [id number] Age: 68 Admit Type: Inpatient Procedure:                Upper GI endoscopy Indications:              Epigastric abdominal pain, Abnormal CT of the GI                            tract Providers:                Gordy CHRISTELLA. Starch, MD, Collene Edu, RN, Joya Bunnell,                            Technician Referring MD:             [facility] Medicines:                Monitored Anesthesia Care Complications:            No immediate complications. Estimated Blood Loss:     Estimated blood loss was minimal. Procedure:                Pre-Anesthesia Assessment:                           - Prior to the procedure, a History and Physical                            was performed, and patient medications and                            allergies were reviewed. The patient's tolerance of                            previous anesthesia was also reviewed. The risks                            and benefits of the procedure and the sedation                            options and risks were discussed with the patient.                            All questions were answered, and informed consent                            was obtained. Prior Anticoagulants: The patient has                            taken no anticoagulant or antiplatelet agents. ASA                            Grade Assessment: III - A patient with severe  systemic disease. After reviewing the risks and                            benefits, the patient was deemed in satisfactory                            condition to undergo the procedure.                           After obtaining informed consent, the endoscope was                            passed under direct vision. Throughout the                            procedure, the  patient's blood pressure, pulse, and                            oxygen saturations were monitored continuously. The                            [id number]-[id number] ([id number]) Olympus endoscope was introduced                            through the mouth, and advanced to the second part                            of duodenum. The upper GI endoscopy was                            accomplished without difficulty. The patient                            tolerated the procedure well. Scope In: Scope Out: Findings:      The examined esophagus was normal.      A large amount of food (residue) was found in the gastric fundus and in       the gastric body.      Diffuse moderate inflammation characterized by congestion (edema),       erosions, erythema and granularity was found in the gastric antrum.       Biopsies were taken with a cold forceps for Helicobacter pylori testing.      One non-bleeding, cratered and circumferential duodenal ulcer was found       in the distal duodenal bulb. This ulcer causes mild luminal narrowing       but the endoscope passes without resistance. Biopsies were taken with a       cold forceps for histology. Impression:               - Normal esophagus.                           - A large amount of food (residue) in the stomach.  Consistent with gastroparesis.                           - Acute antral gastritis. Biopsied to exclude H.                            Pylori.                           - Non-bleeding, circumferential, duodenal ulcer in                            distal bulb causing mild luminal narrowing (but not                            obstruction). Biopsied to exclude dysplasia. Moderate Sedation:      N/A Recommendation:           - Return patient to hospital ward for ongoing care.                           - Soft diet.                           - No aspirin, ibuprofen , naproxen , or other                            non-steroidal  anti-inflammatory drugs. This is very                            important for ulcer healing.                           - BID PPI x 12 weeks and then once daily thereafter.                           - Await pathology results.                           - GI will sign off, call if questions. Procedure Code(s):        --- Professional ---                           [phone], Esophagogastroduodenoscopy, flexible,                            transoral; with biopsy, single or multiple Diagnosis Code(s):        --- Professional ---                           K29.00, Acute gastritis without bleeding                           K26.9, Duodenal ulcer, unspecified as acute or                            chronic, without hemorrhage  or perforation                           R10.13, Epigastric pain                           R93.3, Abnormal findings on diagnostic imaging of                            other parts of digestive tract CPT copyright 2022 American [employer]. All rights reserved. The codes documented in this report are preliminary and upon coder review may  be revised to meet current compliance requirements. Gordy CHRISTELLA Starch, MD 08/24/2024 8:34:48 AM This report has been signed electronically. Number of Addenda: 0

## 2024-08-24 NOTE — Progress Notes (Signed)
 CCMD called and reported 8 bts of V-tach.  The patient is asymptomatic.

## 2024-08-24 NOTE — Anesthesia Preprocedure Evaluation (Addendum)
 Anesthesia Evaluation  Patient identified by MRN, date of birth, ID band Patient awake    Reviewed: Allergy & Precautions, NPO status , Patient's Chart, lab work & pertinent test results  History of Anesthesia Complications Negative for: history of anesthetic complications  Airway Mallampati: III  TM Distance: >3 FB     Dental  (+) Dental Advisory Given, Chipped,    Pulmonary shortness of breath and with exertion, neg COPD   breath sounds clear to auscultation       Cardiovascular hypertension, Pt. on medications (-) angina +CHF  (-) Cardiac Stents + dysrhythmias Atrial Fibrillation  Rhythm:Irregular   1. No LV thrombus by Definity . Left ventricular ejection fraction, by  estimation, is 40 to 45%. The left ventricle has mildly decreased  function. Left ventricular endocardial border not optimally defined to  evaluate regional wall motion. There is mild  left ventricular hypertrophy. Left ventricular diastolic function could  not be evaluated.   2. Right ventricular systolic function is moderately reduced. The right  ventricular size is moderately enlarged. Tricuspid regurgitation signal is  inadequate for assessing PA pressure.   3. Left atrial size was severely dilated.   4. Right atrial size was moderately dilated.   5. The mitral valve is abnormal. Mild mitral valve regurgitation. No  evidence of mitral stenosis. Moderate mitral annular calcification.   6. The aortic valve has an indeterminant number of cusps. Aortic valve  regurgitation is not visualized. No aortic stenosis is present.     Neuro/Psych  PSYCHIATRIC DISORDERS      CVA, No Residual Symptoms    GI/Hepatic ,,,(+) Cirrhosis       , Hepatitis -, C  Endo/Other    Renal/GU Lab Results      Component                Value               Date                      NA                       138                 08/22/2024                K                         4.7                 08/22/2024                CO2                      19 (L)              08/22/2024                GLUCOSE                  95                  08/22/2024                BUN                      19  08/22/2024                CREATININE               0.91                08/22/2024                CALCIUM                   8.6 (L)             08/22/2024                GFRNONAA                 >60                 08/22/2024                Musculoskeletal negative musculoskeletal ROS (+)    Abdominal   Peds  Hematology negative hematology ROS (+) Lab Results      Component                Value               Date                      WBC                      6.2                 08/22/2024                HGB                      15.7                08/22/2024                HCT                      48.1                08/22/2024                MCV                      85.3                08/22/2024                PLT                      141 (L)             08/22/2024              Anesthesia Other Findings 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  Reproductive/Obstetrics                              Anesthesia Physical Anesthesia Plan  ASA: 3  Anesthesia Plan: MAC   Post-op Pain Management: Minimal or no pain anticipated   Induction: Intravenous  PONV Risk Score and Plan: 1 and Propofol  infusion and Treatment may vary due to age or medical condition  Airway Management Planned: Nasal Cannula, Natural Airway and Simple Face Mask  Additional Equipment: None  Intra-op Plan:   Post-operative Plan:   Informed Consent: I have reviewed the patients History and Physical, chart, labs and discussed the procedure including the risks, benefits and alternatives for the proposed anesthesia with the patient or authorized representative who has indicated his/her understanding and acceptance.     Dental advisory given  Plan Discussed with:  CRNA  Anesthesia Plan Comments:          Anesthesia Quick Evaluation

## 2024-08-24 NOTE — Interval H&P Note (Signed)
 History and Physical Interval Note: For EGD today to eval abnl CT duodenum, cirrhosis, epigastric pain The nature of the procedure, as well as the risks, benefits, and alternatives were carefully and thoroughly reviewed with the patient. Ample time for discussion and questions allowed. The patient understood, was satisfied, and agreed to proceed.      Latest Ref Rng & Units 08/22/2024    6:51 AM 08/21/2024    4:59 AM 08/20/2024    4:17 AM  CBC  WBC 4.0 - 10.5 K/uL 6.2  6.4  5.0   Hemoglobin 13.0 - 17.0 g/dL 84.2  84.5  85.3   Hematocrit 39.0 - 52.0 % 48.1  46.9  45.7   Platelets 150 - 400 K/uL 141  151  125    Lab Results  Component Value Date   INR 1.1 08/05/2024   INR 1.1 05/01/2024   INR 1.3 (H) 10/18/2023     08/24/2024 7:26 AM  [name]  has presented today for surgery, with the diagnosis of Epigastric pain, duodenitis on CT.  The various methods of treatment have been discussed with the patient and family. After consideration of risks, benefits and other options for treatment, the patient has consented to  Procedure(s): EGD (ESOPHAGOGASTRODUODENOSCOPY) (N/A) as a surgical intervention.  The patient's history has been reviewed, patient examined, no change in status, stable for surgery.  I have reviewed the patient's chart and labs.  Questions were answered to the patient's satisfaction.     [name]

## 2024-08-24 NOTE — Transfer of Care (Signed)
 Immediate Anesthesia Transfer of Care Note  Patient: [name]  Procedure(s) Performed: EGD (ESOPHAGOGASTRODUODENOSCOPY) BIOPSY,GI  Patient Location: PACU  Anesthesia Type:MAC  Level of Consciousness: awake and drowsy  Airway & Oxygen Therapy: Patient Spontanous Breathing and Patient connected to face mask oxygen  Post-op Assessment: Report given to RN, Post -op Vital signs reviewed and stable, and Patient moving all extremities X 4  Post vital signs: Reviewed and stable  Last Vitals:  Vitals Value Taken Time  BP 119/74 08/24/24 08:39  Temp    Pulse 65 08/24/24 08:41  Resp 14 08/24/24 08:41  SpO2 100 % 08/24/24 08:41  Vitals shown include unfiled device data.  Last Pain:  Vitals:   08/24/24 0728  TempSrc: Temporal  PainSc: 8       Patients Stated Pain Goal: 2 (08/19/24 1603)  Complications: No notable events documented.

## 2024-08-25 DIAGNOSIS — R935 Abnormal findings on diagnostic imaging of other abdominal regions, including retroperitoneum: Secondary | ICD-10-CM | POA: Diagnosis not present

## 2024-08-25 DIAGNOSIS — K2980 Duodenitis without bleeding: Secondary | ICD-10-CM | POA: Diagnosis not present

## 2024-08-25 DIAGNOSIS — K7030 Alcoholic cirrhosis of liver without ascites: Secondary | ICD-10-CM | POA: Diagnosis not present

## 2024-08-25 MED ORDER — OFF THE BEAT BOOK
Freq: Once | Status: AC
  Filled 2024-08-25: qty 1

## 2024-08-25 NOTE — Progress Notes (Signed)
 [facility] PROGRESS NOTE    Progress Note  [name]  FMW:[id number] DOB: June 13, 1956 DOA: 08/16/2024 PCP: Delores Rojelio Caldron, NP     Brief Narrative:   [name] is an 68 y.o. male past medical history of HFrEF chronic atrial fibrillation not on anticoagulation, alcoholic liver cirrhosis polysubstance abuse brought in by EMS for shortness of breath and chest pain was found to be in A-fib with RVR started on IV Cardizem  IV heparin  drip. CT scan of the abdomen pelvis showed duodenitis.  2D echo showed an EF of 40% cardiology was consulted.  The patient has also been complaining of epigastric pain he consumes Motrin  about 6 times a day he has done that for years. Assessment/Plan:   Atrial fibrillation with rapid ventricular response (HCC) Likely due to noncompliance with his medication. Now on Toprol -XL and digoxin and rate control Alcohol  abuse seems to be ongoing. Cardiology to continue to titrate medications. Will need to be off anticoagulation upon discharge due to his recurrence medication noncompliance and alcohol  use.  Chronic HFrEF: Chest x-ray showed no acute findings. 2D echo was done that showed an EF of 40% Continue Toprol .  Duodenitis seen on CT, Continue on PPI, no alarming symptoms hemoglobin is stable. EGD on 08/23/2024 gastritis with nonbleeding circumferential duodenal ulcer with mild luminal narrowing and large amount of food residue's. Biopsy for H. Pylori sent. GI rec soft diet advance slowly. Continue to avoid NSAIDs continue Protonix  twice a day. Continues to have abdominal pain.  Alcohol  use: Continue gabapentin . Monitor with CIWA protocol.  Alcoholic liver cirrhosis: Counseling.  Essential hypertension: Continue metoprolol .  History of substance abuse: UDS unremarkable.  Thrombocytopenia: Likely due to alcohol  abuse.   DVT prophylaxis: SCDs Family Communication:none Status is: Inpatient Remains inpatient appropriate because:  Persistent atrial fibrillation    Code Status:     Code Status Orders  (From admission, onward)           Start     Ordered   08/16/24 2358  Full code  Continuous       Question:  By:  Answer:  Consent: discussion documented in EHR   08/16/24 2358           Code Status History     Date Active Date Inactive Code Status Order ID Comments User Context   08/05/2024 1759 08/06/2024 2225 Full Code [id number]  Jonel Lonni SQUIBB, MD ED   05/03/2024 1622 05/31/2024 2101 Full Code [id number]  Wilkie Majel RAMAN, FNP Inpatient   05/01/2024 0206 05/03/2024 1620 Full Code [id number]  Dasie Leonor CROME, MD Inpatient   10/18/2023 2315 10/22/2023 1543 Full Code [id number]  Silvester Ales, MD ED   10/05/2023 1404 10/16/2023 1809 Full Code [id number]  Zella Katha HERO, MD ED   07/02/2023 0114 07/05/2023 2024 Full Code [id number]  Lee Kingfisher, MD ED   07/01/2023 1055 07/01/2023 1835 Full Code [id number]  Randall Starlyn HERO, NP ED   07/01/2023 1036 07/01/2023 1055 Full Code [id number]  Randall Starlyn HERO, NP ED   06/30/2023 2225 07/01/2023 1013 Full Code [id number]  Green Roxianne BRAVO, NP ED   05/07/2023 1021 05/12/2023 2116 Full Code [id number]  Ezzard Staci SAILOR, NP ED   05/07/2023 0051 05/07/2023 1017 Full Code [id number]  Mardi Kathryne LABOR, NP ED   05/06/2023 1043 05/06/2023 1536 Full Code [id number]  Randall Starlyn HERO, NP ED   02/24/2023 0016 04/03/2023 2230 Full Code [id number]  Kinsinger, Herlene Righter, MD Inpatient   01/24/2023 1524 01/24/2023 2107 Full Code  [id number]  Darra Fonda MATSU, MD ED   01/10/2023 0022 01/12/2023 1427 Full Code [id number]  Trudy Carwin, NP ED   01/09/2023 1707 01/09/2023 2358 Full Code [id number]  Adolph Tinnie BRAVO, PA-C ED   07/06/2022 1626 07/08/2022 1828 Full Code [id number]  Elner Luisa NOVAK, NP ED   06/10/2022 1243 06/15/2022 1517 Full Code [id number]  Teresa Wyline CROME, NP ED   05/30/2022 1019 06/01/2022 2223 Full Code [id number]  Marry Clamp, MD ED   05/29/2022 1954 05/30/2022 1019 Full  Code [id number]  Mardi Kathryne LABOR, NP ED   05/24/2022 2100 05/25/2022 1833 Full Code [id number]  Alan Thurman GAILS, NP ED   05/10/2022 1414 05/11/2022 1703 Full Code [id number]  Melvenia Motto, MD ED   05/06/2022 0946 05/06/2022 1300 Full Code [id number]  Levander Houston, MD ED   04/20/2022 1903 05/03/2022 2313 Full Code [id number]  Stechschulte, Deward PARAS, MD ED   03/31/2022 2256 04/02/2022 2014 Full Code [id number]  Long, Fonda MATSU, MD ED   01/22/2022 1349 01/22/2022 1926 Full Code [id number]  Eudelia Maude SAUNDERS, PA-C ED   01/05/2022 1358 01/06/2022 1603 Full Code [id number]  Redwine, Madison LABOR, PA-C ED   02/07/2021 2310 02/11/2021 2019 Full Code [id number]  Franchester Snare, MD ED   01/20/2021 2051 01/26/2021 1608 Full Code [id number]  Doretha Folks, MD ED   12/01/2020 1905 01/18/2021 2312 Full Code [id number]  Stechschulte, Deward PARAS, MD ED   11/24/2020 2254 11/26/2020 1331 Full Code [id number]  Beverley Leita LABOR, PA-C ED   03/17/2012 1740 03/20/2012 1121 Full Code [id number]  Margrette Pear, MD ED         IV Access:   Peripheral IV   Procedures and diagnostic studies:   No results found.    Medical Consultants:   None.   Subjective:    [name] after he ate lunch yesterday he felt really nauseated with abdominal pain  Objective:    Vitals:   08/24/24 1954 08/24/24 2300 08/25/24 0458 08/25/24 0743  BP: 121/72 125/83 (!) 135/92 121/83  Pulse: 78 88 94 79  Resp: 16 15 16 16   Temp: 97.6 F (36.4 C) (!) 97.4 F (36.3 C) 97.6 F (36.4 C) 98.5 F (36.9 C)  TempSrc: Oral Oral Oral Oral  SpO2: 98% 99% 98% 100%  Weight:   104.4 kg   Height:       SpO2: 100 %  No intake or output data in the 24 hours ending 08/25/24 0833  Filed Weights   08/22/24 0308 08/23/24 0555 08/25/24 0458  Weight: 102.7 kg 101.2 kg 104.4 kg    Exam: General exam: In no acute distress. Respiratory system: Good air movement and clear to auscultation. Cardiovascular system: S1 & S2 heard, RRR. No JVD. Gastrointestinal  system: Abdomen is nondistended, soft and nontender.  Extremities: No pedal edema. Skin: No rashes, lesions or ulcers Psychiatry: Judgement and insight appear normal. Mood & affect appropriate.  Data Reviewed:    Labs: Basic Metabolic Panel: Recent Labs  Lab 08/18/24 0934 08/19/24 0956 08/20/24 0417 08/21/24 0459 08/22/24 0651  NA 137 139 138 135 138  K 4.2 4.3 3.8 4.5 4.7  CL 107 105 104 101 105  CO2 21* 21* 22 23 19*  GLUCOSE 166* 136* 145* 116* 95  BUN 9 9 14 19 19   CREATININE 0.97 0.95 1.08 0.94 0.91  CALCIUM  8.7* 8.9 9.1 9.1 8.6*  MG  --  1.7 1.7 1.8 1.9  PHOS 2.6 2.6 3.5 4.4  --    GFR  Estimated Creatinine Clearance: 91.2 mL/min (by C-G formula based on SCr of 0.91 mg/dL). Liver Function Tests: Recent Labs  Lab 08/18/24 0934 08/19/24 0956 08/20/24 0417 08/21/24 0459 08/22/24 0651  AST  --   --   --   --  30  ALT  --   --   --   --  25  ALKPHOS  --   --   --   --  63  BILITOT  --   --   --   --  0.8  PROT  --   --   --   --  6.8  ALBUMIN  2.6* 2.8* 2.6* 2.9* 2.8*   No results for input(s): LIPASE, AMYLASE in the last 168 hours.  No results for input(s): AMMONIA in the last 168 hours. Coagulation profile No results for input(s): INR, PROTIME in the last 168 hours. COVID-19 Labs  No results for input(s): DDIMER, FERRITIN, LDH, CRP in the last 72 hours.  Lab Results  Component Value Date   SARSCOV2NAA NEGATIVE 05/02/2024   SARSCOV2NAA NEGATIVE 10/05/2023   SARSCOV2NAA NEGATIVE 07/01/2023   SARSCOV2NAA NEGATIVE 01/09/2023    CBC: Recent Labs  Lab 08/19/24 0458 08/20/24 0417 08/21/24 0459 08/22/24 0651  WBC 5.9 5.0 6.4 6.2  HGB 14.6 14.6 15.4 15.7  HCT 45.5 45.7 46.9 48.1  MCV 86.5 87.5 85.6 85.3  PLT 112* 125* 151 141*   Cardiac Enzymes: No results for input(s): CKTOTAL, CKMB, CKMBINDEX, TROPONINI in the last 168 hours. BNP (last 3 results) No results for input(s): PROBNP in the last 8760 hours. CBG: No  results for input(s): GLUCAP in the last 168 hours. D-Dimer: No results for input(s): DDIMER in the last 72 hours. Hgb A1c: No results for input(s): HGBA1C in the last 72 hours.  Lipid Profile: No results for input(s): CHOL, HDL, LDLCALC, TRIG, CHOLHDL, LDLDIRECT in the last 72 hours. Thyroid  function studies: No results for input(s): TSH, T4TOTAL, T3FREE, THYROIDAB in the last 72 hours.  Invalid input(s): FREET3 Anemia work up: No results for input(s): VITAMINB12, FOLATE, FERRITIN, TIBC, IRON, RETICCTPCT in the last 72 hours. Sepsis Labs: Recent Labs  Lab 08/19/24 0458 08/20/24 0417 08/21/24 0459 08/22/24 0651  WBC 5.9 5.0 6.4 6.2   Microbiology Recent Results (from the past 240 hours)  MRSA Next Gen by PCR, Nasal     Status: Abnormal   Collection Time: 08/17/24  3:48 AM   Specimen: Nasal Mucosa; Nasal Swab  Result Value Ref Range Status   MRSA by PCR Next Gen DETECTED (A) NOT DETECTED Final    Comment: RESULT CALLED TO, READ BACK BY AND VERIFIED WITH: A BUENDIA RN 08/17/2024 @ 9376 BY AB (NOTE) The GeneXpert MRSA Assay (FDA approved for NASAL specimens only), is one component of a comprehensive MRSA colonization surveillance program. It is not intended to diagnose MRSA infection nor to guide or monitor treatment for MRSA infections. Test performance is not FDA approved in patients less than 16 years old. Performed at [facility], [address] [place], [place] [address]      Medications:    baclofen   10 mg Oral TID   folic acid   1 mg Oral Daily   gabapentin   300 mg Oral TID   metoprolol  succinate  150 mg Oral Q12H   multivitamin with minerals  1 tablet Oral Daily   off the beat book   Does not apply Once   pantoprazole   40 mg Oral BID AC   thiamine   100 mg  Oral Daily   Or   thiamine   100 mg Intravenous Daily   Continuous Infusions:      LOS: 8 days   [name]  [facility]  08/25/2024,  8:33 AM

## 2024-08-25 NOTE — Plan of Care (Signed)
  Problem: Education: Goal: Knowledge of General Education information will improve Description: Including pain rating scale, medication(s)/side effects and non-pharmacologic comfort measures Outcome: Progressing   Problem: Health Behavior/Discharge Planning: Goal: Ability to manage health-related needs will improve Outcome: Progressing   Problem: Clinical Measurements: Goal: Ability to maintain clinical measurements within normal limits will improve Outcome: Progressing Goal: Will remain free from infection Outcome: Progressing Goal: Diagnostic test results will improve Outcome: Progressing Goal: Respiratory complications will improve Outcome: Progressing Goal: Cardiovascular complication will be avoided Outcome: Progressing   Problem: Activity: Goal: Risk for activity intolerance will decrease Outcome: Progressing   Problem: Nutrition: Goal: Adequate nutrition will be maintained Outcome: Progressing   Problem: Coping: Goal: Level of anxiety will decrease Outcome: Progressing   Problem: Elimination: Goal: Will not experience complications related to bowel motility Outcome: Progressing Goal: Will not experience complications related to urinary retention Outcome: Progressing   Problem: Pain Managment: Goal: General experience of comfort will improve and/or be controlled Outcome: Progressing   Problem: Safety: Goal: Ability to remain free from injury will improve Outcome: Progressing   Problem: Skin Integrity: Goal: Risk for impaired skin integrity will decrease Outcome: Progressing   Problem: Education: Goal: Knowledge of disease or condition will improve Outcome: Progressing Goal: Understanding of medication regimen will improve Outcome: Progressing Goal: Individualized Educational Video(s) Outcome: Progressing   Problem: Cardiac: Goal: Ability to achieve and maintain adequate cardiopulmonary perfusion will improve Outcome: Progressing   Problem: Health  Behavior/Discharge Planning: Goal: Ability to safely manage health-related needs after discharge will improve Outcome: Progressing   Problem: Activity: Goal: Capacity to carry out activities will improve Outcome: Progressing

## 2024-08-26 ENCOUNTER — Encounter: Payer: Self-pay | Admitting: Physician Assistant

## 2024-08-26 ENCOUNTER — Encounter (HOSPITAL_COMMUNITY): Payer: Self-pay | Admitting: Internal Medicine

## 2024-08-26 ENCOUNTER — Ambulatory Visit: Attending: Physician Assistant | Admitting: Physician Assistant

## 2024-08-26 DIAGNOSIS — R935 Abnormal findings on diagnostic imaging of other abdominal regions, including retroperitoneum: Secondary | ICD-10-CM | POA: Diagnosis not present

## 2024-08-26 DIAGNOSIS — K2980 Duodenitis without bleeding: Secondary | ICD-10-CM | POA: Diagnosis not present

## 2024-08-26 DIAGNOSIS — K7030 Alcoholic cirrhosis of liver without ascites: Secondary | ICD-10-CM | POA: Diagnosis not present

## 2024-08-26 MED ORDER — POLYETHYLENE GLYCOL 3350 17 G PO PACK
17.0000 g | PACK | Freq: Two times a day (BID) | ORAL | Status: DC
Start: 2024-08-26 — End: 2024-08-28
  Filled 2024-08-26: qty 1

## 2024-08-26 MED ORDER — SORBITOL 70 % SOLN
60.0000 mL | Freq: Once | Status: AC
  Administered 2024-08-26: 60 mL via ORAL
  Filled 2024-08-26: qty 60

## 2024-08-26 NOTE — Progress Notes (Signed)
 [facility] PROGRESS NOTE    Progress Note  [name]  FMW:[id number] DOB: [birth year] DOA: 08/16/2024 PCP: Delores Rojelio Caldron, NP     Brief Narrative:   [name] is an 68 y.o. male past medical history of HFrEF chronic atrial fibrillation not on anticoagulation, alcoholic liver cirrhosis polysubstance abuse brought in by EMS for shortness of breath and chest pain was found to be in A-fib with RVR started on IV Cardizem  IV heparin  drip. CT scan of the abdomen pelvis showed duodenitis.  2D echo showed an EF of 40% cardiology was consulted.  The patient has also been complaining of epigastric pain he consumes Motrin  about 6 times a day he has done that for years. Assessment/Plan:   Atrial fibrillation with rapid ventricular response (HCC) Likely due to noncompliance with his medication. Now on Toprol -XL and digoxin and rate control Alcohol  abuse seems to be ongoing. Cardiology to continue to titrate medications. Will need to be off anticoagulation upon discharge due to his recurrence medication noncompliance and alcohol  use.  Chronic HFrEF: Chest x-ray showed no acute findings. 2D echo was done that showed an EF of 40% Continue Toprol .  Duodenal ulcer with mild luminal narrowing: Continue on PPI, no alarming symptoms hemoglobin is stable. EGD on 08/23/2024 gastritis with nonbleeding circumferential duodenal ulcer with mild luminal narrowing and large amount of food residue's. Biopsy for H. Pylori sent. GI rec soft diet advance slowly. Continue to avoid NSAIDs continue Protonix  twice a day. He only ate 1 meal yesterday urinated went well. Will try again today, continues to have abdominal pain.  Alcohol  use: Continue gabapentin . Monitor with CIWA protocol.  Alcoholic liver cirrhosis: Counseling.  Essential hypertension: Continue metoprolol .  History of substance abuse: UDS unremarkable.  Thrombocytopenia: Likely due to alcohol  abuse.   DVT prophylaxis:  SCDs Family Communication:none Status is: Inpatient Remains inpatient appropriate because: Persistent atrial fibrillation    Code Status:     Code Status Orders  (From admission, onward)           Start     Ordered   08/16/24 2358  Full code  Continuous       Question:  By:  Answer:  Consent: discussion documented in EHR   08/16/24 2358           Code Status History     Date Active Date Inactive Code Status Order ID Comments User Context   08/05/2024 1759 08/06/2024 2225 Full Code [id number]  Jonel Lonni SQUIBB, MD ED   05/03/2024 1622 05/31/2024 2101 Full Code [id number]  Wilkie Majel RAMAN, FNP Inpatient   05/01/2024 0206 05/03/2024 1620 Full Code [id number]  Dasie Leonor CROME, MD Inpatient   10/18/2023 2315 10/22/2023 1543 Full Code [id number]  Silvester Ales, MD ED   10/05/2023 1404 10/16/2023 1809 Full Code [id number]  Zella Katha HERO, MD ED   07/02/2023 0114 07/05/2023 2024 Full Code [id number]  Lee Kingfisher, MD ED   07/01/2023 1055 07/01/2023 1835 Full Code [id number]  Randall Starlyn HERO, NP ED   07/01/2023 1036 07/01/2023 1055 Full Code [id number]  Randall Starlyn HERO, NP ED   06/30/2023 2225 07/01/2023 1013 Full Code [id number]  Green Roxianne BRAVO, NP ED   05/07/2023 1021 05/12/2023 2116 Full Code [id number]  Ezzard Staci SAILOR, NP ED   05/07/2023 0051 05/07/2023 1017 Full Code [id number]  Mardi Kathryne LABOR, NP ED   05/06/2023 1043 05/06/2023 1536 Full Code [id number]  Randall Starlyn HERO, NP ED   02/24/2023 0016 04/03/2023 2230 Full Code  [id number]  Kinsinger, Herlene Righter, MD Inpatient   01/24/2023 1524 01/24/2023 2107 Full Code [id number]  Darra Fonda MATSU, MD ED   01/10/2023 0022 01/12/2023 1427 Full Code [id number]  Trudy Carwin, NP ED   01/09/2023 1707 01/09/2023 2358 Full Code [id number]  Adolph Tinnie BRAVO, PA-C ED   07/06/2022 1626 07/08/2022 1828 Full Code [id number]  Elner Luisa NOVAK, NP ED   06/10/2022 1243 06/15/2022 1517 Full Code [id number]  Teresa Wyline CROME, NP ED   05/30/2022 1019  06/01/2022 2223 Full Code [id number]  Marry Clamp, MD ED   05/29/2022 1954 05/30/2022 1019 Full Code [id number]  Mardi Kathryne LABOR, NP ED   05/24/2022 2100 05/25/2022 1833 Full Code [id number]  Alan Thurman GAILS, NP ED   05/10/2022 1414 05/11/2022 1703 Full Code [id number]  Melvenia Motto, MD ED   05/06/2022 0946 05/06/2022 1300 Full Code [id number]  Levander Houston, MD ED   04/20/2022 1903 05/03/2022 2313 Full Code [id number]  Stechschulte, Deward PARAS, MD ED   03/31/2022 2256 04/02/2022 2014 Full Code [id number]  Long, Fonda MATSU, MD ED   01/22/2022 1349 01/22/2022 1926 Full Code [id number]  Eudelia Maude SAUNDERS, PA-C ED   01/05/2022 1358 01/06/2022 1603 Full Code [id number]  Redwine, Madison LABOR, PA-C ED   02/07/2021 2310 02/11/2021 2019 Full Code [id number]  Franchester Snare, MD ED   01/20/2021 2051 01/26/2021 1608 Full Code [id number]  Doretha Folks, MD ED   12/01/2020 1905 01/18/2021 2312 Full Code [id number]  Stechschulte, Deward PARAS, MD ED   11/24/2020 2254 11/26/2020 1331 Full Code [id number]  Beverley Leita LABOR, PA-C ED   03/17/2012 1740 03/20/2012 1121 Full Code [id number]  Margrette Pear, MD ED         IV Access:   Peripheral IV   Procedures and diagnostic studies:   No results found.    Medical Consultants:   None.   Subjective:    Darina SAUNDERS Mura no further nausea with eating although he only ate 1 meal yesterday.  He will try to gain today try to eat 2-3 meals.  Still with some abdominal discomfort  Objective:    Vitals:   08/25/24 2044 08/26/24 0011 08/26/24 0448 08/26/24 0813  BP: 116/68 121/72 127/76 137/67  Pulse: 76 76 64 69  Resp: 18 18 18 19   Temp: 97.9 F (36.6 C) 97.9 F (36.6 C) 98 F (36.7 C)   TempSrc: Oral Oral Oral Oral  SpO2: 98% 98% 95% 98%  Weight:   104.3 kg   Height:       SpO2: 98 %   Intake/Output Summary (Last 24 hours) at 08/26/2024 0830 Last data filed at 08/25/2024 2047 Gross per 24 hour  Intake 360 ml  Output --  Net 360 ml    Filed Weights   08/23/24 0555 08/25/24  0458 08/26/24 0448  Weight: 101.2 kg 104.4 kg 104.3 kg    Exam: General exam: In no acute distress. Respiratory system: Good air movement and clear to auscultation. Cardiovascular system: S1 & S2 heard, RRR. No JVD. Gastrointestinal system: Abdomen is nondistended, soft and nontender.  Extremities: No pedal edema. Skin: No rashes, lesions or ulcers Psychiatry: Judgement and insight appear normal. Mood & affect appropriate.  Data Reviewed:    Labs: Basic Metabolic Panel: Recent Labs  Lab 08/19/24 0956 08/20/24 0417 08/21/24 0459 08/22/24 0651  NA 139 138 135 138  K 4.3 3.8 4.5 4.7  CL 105 104 101 105  CO2 21* 22 23 19*  GLUCOSE 136* 145* 116* 95  BUN 9 14 19 19   CREATININE 0.95 1.08 0.94 0.91  CALCIUM  8.9 9.1 9.1 8.6*  MG 1.7 1.7 1.8 1.9  PHOS 2.6 3.5 4.4  --    GFR Estimated Creatinine Clearance: 91.1 mL/min (by C-G formula based on SCr of 0.91 mg/dL). Liver Function Tests: Recent Labs  Lab 08/19/24 0956 08/20/24 0417 08/21/24 0459 08/22/24 0651  AST  --   --   --  30  ALT  --   --   --  25  ALKPHOS  --   --   --  63  BILITOT  --   --   --  0.8  PROT  --   --   --  6.8  ALBUMIN  2.8* 2.6* 2.9* 2.8*   No results for input(s): LIPASE, AMYLASE in the last 168 hours.  No results for input(s): AMMONIA in the last 168 hours. Coagulation profile No results for input(s): INR, PROTIME in the last 168 hours. COVID-19 Labs  No results for input(s): DDIMER, FERRITIN, LDH, CRP in the last 72 hours.  Lab Results  Component Value Date   SARSCOV2NAA NEGATIVE 05/02/2024   SARSCOV2NAA NEGATIVE 10/05/2023   SARSCOV2NAA NEGATIVE 07/01/2023   SARSCOV2NAA NEGATIVE 01/09/2023    CBC: Recent Labs  Lab 08/20/24 0417 08/21/24 0459 08/22/24 0651  WBC 5.0 6.4 6.2  HGB 14.6 15.4 15.7  HCT 45.7 46.9 48.1  MCV 87.5 85.6 85.3  PLT 125* 151 141*   Cardiac Enzymes: No results for input(s): CKTOTAL, CKMB, CKMBINDEX, TROPONINI in the last 168  hours. BNP (last 3 results) No results for input(s): PROBNP in the last 8760 hours. CBG: No results for input(s): GLUCAP in the last 168 hours. D-Dimer: No results for input(s): DDIMER in the last 72 hours. Hgb A1c: No results for input(s): HGBA1C in the last 72 hours.  Lipid Profile: No results for input(s): CHOL, HDL, LDLCALC, TRIG, CHOLHDL, LDLDIRECT in the last 72 hours. Thyroid  function studies: No results for input(s): TSH, T4TOTAL, T3FREE, THYROIDAB in the last 72 hours.  Invalid input(s): FREET3 Anemia work up: No results for input(s): VITAMINB12, FOLATE, FERRITIN, TIBC, IRON, RETICCTPCT in the last 72 hours. Sepsis Labs: Recent Labs  Lab 08/20/24 0417 08/21/24 0459 08/22/24 0651  WBC 5.0 6.4 6.2   Microbiology Recent Results (from the past 240 hours)  MRSA Next Gen by PCR, Nasal     Status: Abnormal   Collection Time: 08/17/24  3:48 AM   Specimen: Nasal Mucosa; Nasal Swab  Result Value Ref Range Status   MRSA by PCR Next Gen DETECTED (A) NOT DETECTED Final    Comment: RESULT CALLED TO, READ BACK BY AND VERIFIED WITH: A BUENDIA RN 08/17/2024 @ 9376 BY AB (NOTE) The GeneXpert MRSA Assay (FDA approved for NASAL specimens only), is one component of a comprehensive MRSA colonization surveillance program. It is not intended to diagnose MRSA infection nor to guide or monitor treatment for MRSA infections. Test performance is not FDA approved in patients less than 5 years old. Performed at [facility], [address] [place], [place] [address]      Medications:    baclofen   10 mg Oral TID   folic acid   1 mg Oral Daily   gabapentin   300 mg Oral TID   metoprolol  succinate  150 mg Oral Q12H   multivitamin with minerals  1 tablet Oral Daily   pantoprazole   40 mg Oral BID AC   thiamine   100 mg Oral Daily   Or  thiamine   100 mg Intravenous Daily   Continuous Infusions:      LOS: 9 days   Erle Odell Castor  [facility]  08/26/2024, 8:30 AM

## 2024-08-26 NOTE — Plan of Care (Signed)
   Problem: Education: Goal: Knowledge of General Education information will improve Description Including pain rating scale, medication(s)/side effects and non-pharmacologic comfort measures Outcome: Progressing

## 2024-08-27 ENCOUNTER — Other Ambulatory Visit (HOSPITAL_COMMUNITY): Payer: Self-pay

## 2024-08-27 DIAGNOSIS — I4891 Unspecified atrial fibrillation: Secondary | ICD-10-CM | POA: Diagnosis not present

## 2024-08-27 MED ORDER — POLYETHYLENE GLYCOL 3350 17 GM/SCOOP PO POWD
17.0000 g | Freq: Two times a day (BID) | ORAL | 0 refills | Status: AC
  Filled 2024-08-27: qty 238, 7d supply, fill #0

## 2024-08-27 MED ORDER — PANTOPRAZOLE SODIUM 40 MG PO TBEC
40.0000 mg | DELAYED_RELEASE_TABLET | Freq: Two times a day (BID) | ORAL | 3 refills | Status: AC
  Filled 2024-08-27: qty 60, 30d supply, fill #0

## 2024-08-27 MED ORDER — METOPROLOL SUCCINATE ER 50 MG PO TB24
150.0000 mg | ORAL_TABLET | Freq: Two times a day (BID) | ORAL | 0 refills | Status: AC
  Filled 2024-08-27: qty 90, 15d supply, fill #0

## 2024-08-27 NOTE — Progress Notes (Signed)
 I have tried repeatedly to give the patient his medications but patient is in the bathroom and has said no not until he comes out. I started trying to give him his meds at 2245. I will check on patient again and hopefully be able to give him his meds soon.

## 2024-08-27 NOTE — Discharge Summary (Signed)
 Physician Discharge Summary  [name] FMW:[id number] DOB: 1955/11/07 DOA: 08/16/2024  PCP: Delores Rojelio Caldron, NP  Admit date: 08/16/2024 Discharge date: 08/27/2024  Admitted From: Home Disposition:  Home  Recommendations for Outpatient Follow-up:  Follow up with PCP in 1-2 weeks Please obtain BMP/CBC in one week   Home Health:No Equipment/Devices:None  Discharge Condition:Stable CODE STATUS:Full Diet recommendation: Heart Healthy   Brief/Interim Summary: 68 y.o. male past medical history of HFrEF chronic atrial fibrillation not on anticoagulation, alcoholic liver cirrhosis polysubstance abuse brought in by EMS for shortness of breath and chest pain was found to be in A-fib with RVR started on IV Cardizem  IV heparin  drip. CT scan of the abdomen pelvis showed duodenitis.  2D echo showed an EF of 40% cardiology was consulted.  The patient has also been complaining of epigastric pain he consumes Motrin  about 6 times a day he has done that for years.   Discharge Diagnoses:  Principal Problem:   Atrial fibrillation with rapid ventricular response (HCC) Active Problems:   Thrombocytopenia   Alcoholic cirrhosis (HCC)   Essential hypertension   Chronic systolic CHF (congestive heart failure) (HCC)   Polysubstance abuse (HCC)   Homeless   Duodenitis   Abnormal CT of the abdomen   Gastritis and gastroduodenitis   Duodenal ulcer  A-fib with RVR: Likely due to noncompliance with his medication. He was placed on metoprolol  and a rate control. Cardiology was consulted recommended that he will need to be off anticoagulation upon discharge as due to his noncompliance and alcohol  use. A long conversation with the patient on day of discharge he relates he did not want to be discharged he will just go to the emergency room at [facility] to be admitted there.  Chronic HFrEF: Continue Toprol .  Abdominal pain likely due to duodenal ulcer with mild luminal narrowing: GI was consulted  perform an EGD that showed gastritis and nonbleeding circumferential duodenal ulcer. Biopsies were taken. There was a lot of food residue's upon EGD removed by GI. He was started on a soft diet which he was able to tolerate. He related he had been taking NSAIDs for pain control. He will continue Protonix  for 6 weeks. He was able to tolerate his diet.  Alcohol  abuse: Continue gabapentin  no signs of withdrawal.  Alcoholic liver cirrhosis: Counseling.  Central hypertension: Continue metoprolol .  History of substance abuse: UDS was unremarkable.  Thrombocytopenia: Likely due to alcohol  abuse.   Discharge Instructions  Discharge Instructions     Diet - low sodium heart healthy   Complete by: As directed    Increase activity slowly   Complete by: As directed       Allergies as of 08/27/2024       Reactions   Carrot [daucus Carota] Anaphylaxis, Swelling, Other (See Comments)   Lips swell- had to receive Benadryl         Medication List     STOP taking these medications    metoprolol  tartrate 100 MG tablet Commonly known as: LOPRESSOR        TAKE these medications    Baclofen  5 MG Tabs Take 1 tablet (5 mg total) by mouth 3 (three) times daily.   gabapentin  300 MG capsule Commonly known as: NEURONTIN  Take 1 capsule (300 mg total) by mouth 3 (three) times daily.   metoprolol  succinate 50 MG 24 hr tablet Commonly known as: TOPROL -XL Take 3 tablets (150 mg total) by mouth every 12 (twelve) hours.   pantoprazole  40 MG tablet Commonly known as:  PROTONIX  Take 1 tablet (40 mg total) by mouth 2 (two) times daily before a meal.   polyethylene glycol 17 g packet Commonly known as: MIRALAX  / GLYCOLAX  Take 17 g by mouth 2 (two) times daily.        Allergies  Allergen Reactions   Carrot [Daucus Carota] Anaphylaxis, Swelling and Other (See Comments)    Lips swell- had to receive Benadryl     Consultations: Gastroenterology   Procedures/Studies: CT HEAD  WO CONTRAST ( ) Result Date: 08/22/2024 EXAM: CT HEAD WITHOUT CONTRAST 08/22/2024 03:05:26 AM TECHNIQUE: CT of the head was performed without the administration of intravenous contrast. Automated exposure control, iterative reconstruction, and/or weight based adjustment of the mA/kV was utilized to reduce the radiation dose to as low as reasonably achievable. COMPARISON: CT head 10/07/2023 CLINICAL HISTORY: Headache, sudden, severe. FINDINGS: BRAIN AND VENTRICLES: No acute hemorrhage. No evidence of acute infarct. No hydrocephalus. No extra-axial collection. No mass effect or midline shift. ORBITS: No acute abnormality. SINUSES: No acute abnormality. SOFT TISSUES AND SKULL: No acute soft tissue abnormality. No skull fracture. IMPRESSION: 1. No acute intracranial abnormality. Electronically signed by: Gilmore Molt MD 08/22/2024 03:20 AM EST RP Workstation: [id number]   ECHOCARDIOGRAM COMPLETE Result Date: 08/18/2024    ECHOCARDIOGRAM REPORT   Patient Name:   [name] Date of Exam: 08/17/2024 Medical Rec #:  [id number]      Height:       69.0 in Accession #:    [id number]     Weight:       223.3 lb Date of Birth:  Aug 11, 1956      BSA:          2.165 m Patient Age:    68 years       BP:           120/56 mmHg Patient Gender: M              HR:           90 bpm. Exam Location:  Inpatient Procedure: 2D Echo, Cardiac Doppler, Color Doppler and Intracardiac            Opacification Agent (Both Spectral and Color Flow Doppler were            utilized during procedure). Indications:    Atrial Fibrillation l48.91  History:        Patient has prior history of Echocardiogram examinations, most                 recent 10/06/2023. CHF, Arrythmias:Atrial Fibrillation; Risk                 Factors:Hypertension.  Sonographer:    Aida Pizza RCS Referring Phys: [id number] MIGNON DASEN GONFA IMPRESSIONS  1. No LV thrombus by Definity . Left ventricular ejection fraction, by estimation, is 40 to 45%. The left ventricle has mildly  decreased function. Left ventricular endocardial border not optimally defined to evaluate regional wall motion. There is mild left ventricular hypertrophy. Left ventricular diastolic function could not be evaluated.  2. Right ventricular systolic function is moderately reduced. The right ventricular size is moderately enlarged. Tricuspid regurgitation signal is inadequate for assessing PA pressure.  3. Left atrial size was severely dilated.  4. Right atrial size was moderately dilated.  5. The mitral valve is abnormal. Mild mitral valve regurgitation. No evidence of mitral stenosis. Moderate mitral annular calcification.  6. The aortic valve has an indeterminant number of cusps. Aortic valve regurgitation is not visualized. No aortic stenosis  is present. Comparison(s): Changes from prior study are noted. FINDINGS  Left Ventricle: No LV thrombus by Definity . Left ventricular ejection fraction, by estimation, is 40 to 45%. The left ventricle has mildly decreased function. Left ventricular endocardial border not optimally defined to evaluate regional wall motion. Definity  contrast agent was given IV to delineate the left ventricular endocardial borders. Strain was performed and the global longitudinal strain is indeterminate. The left ventricular internal cavity size was normal in size. There is mild left ventricular hypertrophy. Left ventricular diastolic function could not be evaluated due to atrial fibrillation. Left ventricular diastolic function could not be evaluated. Right Ventricle: The right ventricular size is moderately enlarged. No increase in right ventricular wall thickness. Right ventricular systolic function is moderately reduced. Tricuspid regurgitation signal is inadequate for assessing PA pressure. Left Atrium: Left atrial size was severely dilated. Right Atrium: Right atrial size was moderately dilated. Pericardium: There is no evidence of pericardial effusion. Mitral Valve: The mitral valve is  abnormal. Moderate mitral annular calcification. Mild mitral valve regurgitation. No evidence of mitral valve stenosis. Tricuspid Valve: The tricuspid valve is normal in structure. Tricuspid valve regurgitation is mild . No evidence of tricuspid stenosis. Aortic Valve: The aortic valve has an indeterminant number of cusps. Aortic valve regurgitation is not visualized. No aortic stenosis is present. Pulmonic Valve: The pulmonic valve was not well visualized. Pulmonic valve regurgitation is not visualized. No evidence of pulmonic stenosis. Aorta: The aortic root is normal in size and structure. Venous: The inferior vena cava was not well visualized. IAS/Shunts: No atrial level shunt detected by color flow Doppler. Additional Comments: 3D was performed not requiring image post processing on an independent workstation and was indeterminate.  LEFT VENTRICLE PLAX 2D LVIDd:         4.30 cm LVIDs:         3.00 cm LV PW:         1.10 cm LV IVS:        1.20 cm LVOT diam:     2.10 cm LV SV:         61 LV SV Index:   28 LVOT Area:     3.46 cm  RIGHT VENTRICLE RV S prime:     10.20 cm/s TAPSE (M-mode): 1.7 cm LEFT ATRIUM              Index        RIGHT ATRIUM           Index LA diam:        4.50 cm  2.08 cm/m   RA Area:     23.60 cm LA Vol (A2C):   83.9 ml  38.75 ml/m  RA Volume:   77.70 ml  35.89 ml/m LA Vol (A4C):   106.0 ml 48.96 ml/m LA Biplane Vol: 100.0 ml 46.19 ml/m  AORTIC VALVE LVOT Vmax:   87.80 cm/s LVOT Vmean:  62.750 cm/s LVOT VTI:    0.175 m  AORTA Ao Root diam: 3.40 cm MITRAL VALVE MV Area (PHT): 3.42 cm     SHUNTS MV Decel Time: 222 msec     Systemic VTI:  0.18 m MV E velocity: 144.00 cm/s  Systemic Diam: 2.10 cm Vishnu Priya Mallipeddi Electronically signed by Diannah Late Mallipeddi Signature Date/Time: 08/18/2024/10:46:46 AM    Final    CT ABDOMEN PELVIS W CONTRAST Result Date: 08/16/2024 EXAM: CT ABDOMEN AND PELVIS WITH CONTRAST 08/16/2024 10:41:07 PM TECHNIQUE: CT of the abdomen and pelvis was  performed with  the administration of 75 mL of iohexol  (OMNIPAQUE ) 350 MG/ML injection. Multiplanar reformatted images are provided for review. Automated exposure control, iterative reconstruction, and/or weight-based adjustment of the mA/kV was utilized to reduce the radiation dose to as low as reasonably achievable. COMPARISON: 10/19/2023 CLINICAL HISTORY: Abdominal pain, acute, nonlocalized. FINDINGS: LOWER CHEST: No acute abnormality. LIVER: Changes of cirrhosis and diffuse fatty infiltration of the liver. GALLBLADDER AND BILE DUCTS: Prior cholecystectomy. No biliary ductal dilatation. SPLEEN: No acute abnormality. PANCREAS: No acute abnormality. ADRENAL GLANDS: No acute abnormality. KIDNEYS, URETERS AND BLADDER: No stones in the kidneys or ureters. No hydronephrosis. No perinephric or periureteral stranding. Urinary bladder is unremarkable. GI AND BOWEL: Stomach demonstrates no acute abnormality. Wall thickening involving the 1st and 2nd portions of the duodenum with surrounding inflammation compatible with duodenitis. There is no bowel obstruction. PERITONEUM AND RETROPERITONEUM: No ascites. No free air. VASCULATURE: Aorta is normal in caliber. Aortic atherosclerosis. LYMPH NODES: No lymphadenopathy. REPRODUCTIVE ORGANS: No acute abnormality. BONES AND SOFT TISSUES: No acute osseous abnormality. No focal soft tissue abnormality. IMPRESSION: 1. Wall thickening involving the first and second portions of the duodenum with surrounding inflammation, compatible with duodenitis. 2. Changes of cirrhosis and diffuse hepatic steatosis. Electronically signed by: Franky Crease MD 08/16/2024 10:45 PM EDT RP Workstation: [id number]   DG Chest Portable 1 View Result Date: 08/16/2024 CLINICAL DATA:  Central chest pain EXAM: PORTABLE CHEST 1 VIEW COMPARISON:  08/05/2024 FINDINGS: The heart size and mediastinal contours are within normal limits. Both lungs are clear. The visualized skeletal structures are unremarkable.  IMPRESSION: No active disease. Electronically Signed   By: Ozell Daring M.D.   On: 08/16/2024 20:44   DG Chest Portable 1 View Result Date: 08/05/2024 EXAM: 1 VIEW(S) XRAY OF THE CHEST 08/05/2024 12:37:00 PM COMPARISON: None available. CLINICAL HISTORY: cp. Per triage notes: Patient arrives via [facility] from pcp for CP and shortness of breath x 1 week. Patient endorses being bit by a spider two weeks ago with associated numbness and pain. Took enture course of abx for spider bite. Afib RVR with EMS. ; HR 170. 12 lead HR 140s. BP 150/70, 98 on room air. 10/10 CP left side FINDINGS: LUNGS AND PLEURA: No focal pulmonary opacity. No pulmonary edema. No pleural effusion. No pneumothorax. HEART AND MEDIASTINUM: No acute abnormality of the cardiac and mediastinal silhouettes. BONES AND SOFT TISSUES: No acute osseous abnormality. IMPRESSION: 1. No acute cardiopulmonary process. Electronically signed by: Norleen Boxer MD 08/05/2024 02:03 PM EDT RP Workstation: [id number]     Subjective: No complaints  Discharge Exam: Vitals:   08/27/24 0415 08/27/24 0852  BP: 138/78 (!) 141/94  Pulse: 63 68  Resp: 18 18  Temp: 98.6 F (37 C) 97.8 F (36.6 C)  SpO2: 98%    Vitals:   08/26/24 2035 08/27/24 0126 08/27/24 0415 08/27/24 0852  BP: 120/61 136/89 138/78 (!) 141/94  Pulse: 67 (!) 111 63 68  Resp: 18 18 18 18   Temp: 98.3 F (36.8 C) 97.9 F (36.6 C) 98.6 F (37 C) 97.8 F (36.6 C)  TempSrc: Oral  Oral Oral  SpO2: 97% 97% 98%   Weight:   104.3 kg   Height:        General: Pt is alert, awake, not in acute distress Cardiovascular: RRR, S1/S2 +, no rubs, no gallops Respiratory: CTA bilaterally, no wheezing, no rhonchi Abdominal: Soft, NT, ND, bowel sounds + Extremities: no edema, no cyanosis    The results of significant diagnostics from this hospitalization (including imaging,  microbiology, ancillary and laboratory) are listed below for reference.     Microbiology: No results found  for this or any previous visit (from the past 240 hours).   Labs: BNP (last 3 results) Recent Labs    10/19/23 1548 04/11/24 2222 08/05/24 1229  BNP 259.6* 53.9 223.5*   Basic Metabolic Panel: Recent Labs  Lab 08/21/24 0459 08/22/24 0651  NA 135 138  K 4.5 4.7  CL 101 105  CO2 23 19*  GLUCOSE 116* 95  BUN 19 19  CREATININE 0.94 0.91  CALCIUM  9.1 8.6*  MG 1.8 1.9  PHOS 4.4  --    Liver Function Tests: Recent Labs  Lab 08/21/24 0459 08/22/24 0651  AST  --  30  ALT  --  25  ALKPHOS  --  63  BILITOT  --  0.8  PROT  --  6.8  ALBUMIN  2.9* 2.8*   No results for input(s): LIPASE, AMYLASE in the last 168 hours. No results for input(s): AMMONIA in the last 168 hours. CBC: Recent Labs  Lab 08/21/24 0459 08/22/24 0651  WBC 6.4 6.2  HGB 15.4 15.7  HCT 46.9 48.1  MCV 85.6 85.3  PLT 151 141*   Cardiac Enzymes: No results for input(s): CKTOTAL, CKMB, CKMBINDEX, TROPONINI in the last 168 hours. BNP: Invalid input(s): POCBNP CBG: No results for input(s): GLUCAP in the last 168 hours. D-Dimer No results for input(s): DDIMER in the last 72 hours. Hgb A1c No results for input(s): HGBA1C in the last 72 hours. Lipid Profile No results for input(s): CHOL, HDL, LDLCALC, TRIG, CHOLHDL, LDLDIRECT in the last 72 hours. Thyroid  function studies No results for input(s): TSH, T4TOTAL, T3FREE, THYROIDAB in the last 72 hours.  Invalid input(s): FREET3 Anemia work up No results for input(s): VITAMINB12, FOLATE, FERRITIN, TIBC, IRON, RETICCTPCT in the last 72 hours. Urinalysis    Component Value Date/Time   COLORURINE STRAW (A) 04/11/2024 2342   APPEARANCEUR CLEAR 04/11/2024 2342   LABSPEC 1.002 (L) 04/11/2024 2342   PHURINE 6.0 04/11/2024 2342   GLUCOSEU NEGATIVE 04/11/2024 2342   HGBUR NEGATIVE 04/11/2024 2342   HGBUR negative 05/12/2010 1022   BILIRUBINUR NEGATIVE 04/11/2024 2342   KETONESUR 5 (A) 04/11/2024 2342    PROTEINUR NEGATIVE 04/11/2024 2342   UROBILINOGEN 1.0 03/17/2012 1530   NITRITE NEGATIVE 04/11/2024 2342   LEUKOCYTESUR NEGATIVE 04/11/2024 2342   Sepsis Labs Recent Labs  Lab 08/21/24 0459 08/22/24 0651  WBC 6.4 6.2   Microbiology No results found for this or any previous visit (from the past 240 hours).   Time coordinating discharge: Over 35 minutes  SIGNED:   Erle Odell Castor, MD  [facility] 08/27/2024, 9:04 AM Pager   If 7PM-7AM, please contact night-coverage [email or web address] Password [email or web address]

## 2024-08-27 NOTE — Progress Notes (Signed)
 Patient finally came out of the bathroom so I could give him his meds. I told him I was not giving Gabapentin , Ambien , Melatonin, and Baclofen  if he wants his pain medicine. He chose the pain medicine. Pt fell asleep in bathroom. For his safety I did not want to give him all those medications with him getting up to bathroom because he took stuff to help him  have a BM today. Will continue to monitor. Told Pt to call so he did not fall down if he is tired.

## 2024-08-27 NOTE — Progress Notes (Signed)
 0930-patient refusing morning medications. States that the Dr. Is discharging him despite being unable to have a bowel movement.

## 2024-08-29 ENCOUNTER — Ambulatory Visit: Payer: Self-pay | Admitting: Internal Medicine

## 2024-08-29 LAB — SURGICAL PATHOLOGY

## 2024-09-03 NOTE — Anesthesia Postprocedure Evaluation (Signed)
 Anesthesia Post Note  Patient: [name]  Procedure(s) Performed: EGD (ESOPHAGOGASTRODUODENOSCOPY) BIOPSY,GI     Patient location during evaluation: PACU Anesthesia Type: MAC Level of consciousness: awake and alert Pain management: pain level controlled Vital Signs Assessment: post-procedure vital signs reviewed and stable Respiratory status: spontaneous breathing, nonlabored ventilation and respiratory function stable Cardiovascular status: stable and blood pressure returned to baseline Postop Assessment: no apparent nausea or vomiting Anesthetic complications: no   No notable events documented.                  Loura Pitt
